# Patient Record
Sex: Female | Born: 1970 | Race: Black or African American | Hispanic: No | Marital: Single | Smoking: Former smoker
Health system: Southern US, Community
[De-identification: ages and names within clinical notes are randomized; demographics above are authoritative.]

## PROBLEM LIST (undated history)

## (undated) DIAGNOSIS — E119 Type 2 diabetes mellitus without complications: Secondary | ICD-10-CM

## (undated) DIAGNOSIS — G4730 Sleep apnea, unspecified: Secondary | ICD-10-CM

## (undated) DIAGNOSIS — N186 End stage renal disease: Secondary | ICD-10-CM

## (undated) DIAGNOSIS — Z9289 Personal history of other medical treatment: Secondary | ICD-10-CM

## (undated) DIAGNOSIS — K219 Gastro-esophageal reflux disease without esophagitis: Secondary | ICD-10-CM

## (undated) DIAGNOSIS — Z992 Dependence on renal dialysis: Secondary | ICD-10-CM

## (undated) DIAGNOSIS — M549 Dorsalgia, unspecified: Secondary | ICD-10-CM

## (undated) DIAGNOSIS — R0600 Dyspnea, unspecified: Secondary | ICD-10-CM

## (undated) DIAGNOSIS — M1990 Unspecified osteoarthritis, unspecified site: Secondary | ICD-10-CM

## (undated) DIAGNOSIS — D649 Anemia, unspecified: Secondary | ICD-10-CM

## (undated) DIAGNOSIS — F419 Anxiety disorder, unspecified: Secondary | ICD-10-CM

## (undated) DIAGNOSIS — G629 Polyneuropathy, unspecified: Secondary | ICD-10-CM

## (undated) DIAGNOSIS — I10 Essential (primary) hypertension: Secondary | ICD-10-CM

## (undated) DIAGNOSIS — G8929 Other chronic pain: Secondary | ICD-10-CM

## (undated) DIAGNOSIS — H409 Unspecified glaucoma: Secondary | ICD-10-CM

## (undated) DIAGNOSIS — I219 Acute myocardial infarction, unspecified: Secondary | ICD-10-CM

## (undated) DIAGNOSIS — E039 Hypothyroidism, unspecified: Secondary | ICD-10-CM

## (undated) DIAGNOSIS — E785 Hyperlipidemia, unspecified: Secondary | ICD-10-CM

## (undated) DIAGNOSIS — Z9981 Dependence on supplemental oxygen: Secondary | ICD-10-CM

## (undated) DIAGNOSIS — J45909 Unspecified asthma, uncomplicated: Secondary | ICD-10-CM

## (undated) HISTORY — PX: KNEE SURGERY: SHX244

## (undated) HISTORY — PX: TUBAL LIGATION: SHX77

## (undated) NOTE — *Deleted (*Deleted)
Rapid Response Event Note   Reason for Call :  Bleeding from L buttock wound.  Pt had hydrotherapy done yesterday(11/13). Last dressing change was at 1000 today and copious serosanguinous drainage was charted.  Initial Focused Assessment:  Pt laying in bed in no distress. She will nod and shake head appropriately to questions, follow commands, and move all extremities. She denies chest pain/SOB/dizziness/lightheadness. Her skin is warm and dry. RN is holding pressure to L buttock wound. There is a continuous flow of blood coming out of wound despite constant pressure.  T-99.6, HR-120s, BP-120/63, RR-22, SpO2-100% on .28 TC.   Bleeding lessened once MD arrive to bedside. MD irrigated wound with NS and dressing was changed. Large clots seen in wound base-these were left undisturbed.   Interventions:  CBC STAT-Hgb 8.1>7.3 Manual pressure held x 40+ minutes.  Wound irrigated by MD and dressing changed.   Transfuse 1 unit PRBCs.  Plan of Care:  Dressing at this time is C/D/I. Transfuse blood and continue to monitor pt closely. Call RRT if further assistance needed.   Event Summary:   MD Notified:  Dr. Coy Saunas notified and came to bedside  Call [place]     Update: 2225-SBP-60-70s, HR-112, pt still at baseline neuro-wise per RN. Pt did received 147mcg fentanyl at 2059. 250cc NS ordered. RN to call bloodbank to see if PRBCs are ready.   Pt seen at 2232. Pt is lethargic but will awaken to verbal command, nod/shake head appropriately, but goes back to sleep very quickly. Skin cool to touch. Pt nods her head "yes" when asked if she's cold. SBP-80s, HR-120s. T and S drawn from pt's CVC and sent to lab. Dr. Coy Saunas to bedside to assess pt. Dressing assessed-it is saturated again and there is a continuous flow of blood again. Pressure held. Additional 250cc NS bolus ordered.     Dillard Essex, RN

## (undated) NOTE — *Deleted (*Deleted)
Physical Therapy Treatment Patient Details Name: Wanda Ramirez MRN: [id number] DOB: 03-07-1971 Today's Date: 05/01/2020    History of Present Illness      PT Comments    ***    Follow Up Recommendations        Equipment Recommendations       Recommendations for Other Services       Precautions / Restrictions      Mobility  Bed Mobility                  Transfers                    Ambulation/Gait                 Stairs             Wheelchair Mobility    Modified Rankin (Stroke Patients Only)       Balance                                            Cognition                                              Exercises      General Comments        Pertinent Vitals/Pain      Home Living                      Prior Function            PT Goals (current goals can now be found in the care plan section)      Frequency           PT Plan      Co-evaluation              AM-PAC PT "6 Clicks" Mobility   Outcome Measure                   End of Session               Time:  -     Charges:                        {enter signature dotphrase here}   Darthula Desa 05/01/2020, 11:58 AM

## (undated) NOTE — *Deleted (*Deleted)
Pharmacy Student Note: For Learning Purposes ONLY. Not an active part of the chart.   Chief Complaint:  Admitted with COVID 19 PNA on 9/13. Progressed to repiratory failure. Condition worsened, sent to ICU then intubated. Extubated 10/6, then quickly re-intubated. Overnight had bleeding on a wound that caused her to be hypotensive, gave multiple rounds of fluids, and PRBCs. Still unstable BP today, and is transitioning to the ICU. Attendings discerning b/w hemorrhagic vs. septic shock.   PMH: ESRD MWF HD, DM, HTN, hypothyroid, HLD, asthma 3L [place], neuropathy, seizure   PSH:   Problems: trach/vent dependece due to COVID ARDS and MSSA PNA, sacral dermal necrosis, MSSA HCAP/VAP, ESRD; DNR-palliative care consulted  Recommendations:   Labs not in yet 11/19 7AM  Admit Complaint:  Anticoagulation: heparin -> d/cd 11/15 922   Infectious Disease: wound infection; concern for HAP/vap (no cxs)  -WBC worsened ( 11/15 to 20.9) but still afebrile -NG Cortrak in place  -meropenem d/cd 11/13 (high risk infection clinical status; undergoing hydrotherapy for wound) -hemorrhage of wound overnight   -on 10/10, resp cx grew S. Aureus (resistant to erythromycin, clindamycin) -on 11/2 E. Coli grew from wound.   -blood cxs came back neg 11/14  Vanc 10/8 >>10/10 10/31>>11/6 Zosyn 10/8 >>10/9; 11/2 >>11/8 Ceftriaxone 10/10 >> 10/16 Remdesivir 9/14 >> 9/18 Decadron 9/14 >>9/23; 10/6 (stridor/incr wob when extubated) >> Actemra x1 9/15 Cefepime 9/20>>9/29 Azithro 9/20>> 9/25 Merrem 11/11 >> 11/13  9/13 BCx - neg 9/14 BCx - neg 9/27 MRSA PCR - neg 9/26 Bcx x2 - neg 10/8 resp MSSA  10/18 BAL -normal flora  10/21 TA - normal flora 10/25 blood>>ngtdF 10/29 resp>>MSSA 11/2 TA >> 11/2 Wound culture >> few Ecoli, few E. Faecalis (Susceptibilities pending), + Anaerobe (merrem) 11/2 BCx negative 11/11 BCx >> ngtd   Cardiovascular -EF 60-65%.  -midodrine 10 mg 11/14 11:50 -> 250 cc boluses ->  unit of blood (1 unit/hr) 11/15 5:15 -> 500 mL IV NS bolus -started levophed 10 mcg to maintain MAPs > pressors D/Cd 11/16 > MAPS ok now. (~80s)\ 11/19 Bps 100-110-ok  Endocrinology: T2DM (attending) -A1c 12.8% -CBGs 119-140s -SSi: determir 30 units BID, aspart TID (11-22 units); 220 at dinner time -CBGs were in range from 11/10-11/14  (140-180) --> then later 11/14, CBGs climbed to 200s.  11/17: in range majority of day 11/18: 200 11/18 AM -PTA synthroid  Gastrointestinal / Nutrition: LFTs wnl, alk phos elevated 316 on 11/12 LBM 11/13 Cortrak in for tube feeds -> MD notes that pt will need PEG tube eventually -Cholestasis of critical illness w/ elevated GGT -SUP w/ Protonix 40mg /44mL  Neurology: ICU delirium -fentanyl weaned off, oxy 10 q6hr -on dex>clonidine wean -clonazepam added 10/21 -seroquel 50 BID and clonazepam -delirium precautions (nonpharm)  Nephrology: ESRD TTS -transitioned from CRRT > HD -Last HD 11/19: 4 hrs, 849 mL (low UF), BFR 400    Pulmonary: trach collar 5L suppl. O2 11/15 > headed to ICU  Electrolytes/other labs -Na 135 11/17 -K 5.8 -> lokelma 10 g TID > 3.9 11/17  Hematology / Oncology -anemia: darbe 150 weekly TSAT 11% Ferritin > 4000 Hgb 7.0 11/15 AM  > Hgb 8.1 11/16 > Hgb 7.8 > Monitor 11/18 -2 units PRBCs given sun night d/t bleed and low Hgb --> not giving Fe d/t high ferritin (> [phone])   MBD -ca and phos in range (binders d/cd > tube feeds)    Ca 9.0 > 9.4     albumn 1.6     Corrected calcium: ~  11 > -phos 11.1 (increased from 11/11-at that time it was in range) > 11 11/18 > on low phos containing tube feeds. Monitor lvls.  -monitor Ca (received rounds of blood) > Ca ok. (9.4)  PTA Medication Issues -off DPH (not taking at home per MD) - Lorin Picket  -sensipar and phoslo  Best Practices: on trach collar F: feeds per Cortrak, to have PEG placed eventually A: fentantyl prn and oxy 10 mg q6 per tube S: quetiapine/clonazepam for  delirium, talked about weaning, but probably not at this point T: possible hemorrhagic shock H:  U: protonix G: glycemic control     Glean Salvo, Festus Aloe PharmD Student

---

## 2014-04-02 HISTORY — PX: AV FISTULA PLACEMENT: SHX1204

## 2015-01-16 DIAGNOSIS — L299 Pruritus, unspecified: Secondary | ICD-10-CM | POA: Insufficient documentation

## 2015-01-16 DIAGNOSIS — R52 Pain, unspecified: Secondary | ICD-10-CM | POA: Insufficient documentation

## 2015-01-16 DIAGNOSIS — D689 Coagulation defect, unspecified: Secondary | ICD-10-CM | POA: Insufficient documentation

## 2015-01-16 DIAGNOSIS — Z841 Family history of disorders of kidney and ureter: Secondary | ICD-10-CM | POA: Insufficient documentation

## 2015-01-16 DIAGNOSIS — M109 Gout, unspecified: Secondary | ICD-10-CM | POA: Insufficient documentation

## 2015-01-16 DIAGNOSIS — G40909 Epilepsy, unspecified, not intractable, without status epilepticus: Secondary | ICD-10-CM | POA: Insufficient documentation

## 2015-01-16 DIAGNOSIS — M1990 Unspecified osteoarthritis, unspecified site: Secondary | ICD-10-CM | POA: Insufficient documentation

## 2015-01-16 DIAGNOSIS — H3500 Unspecified background retinopathy: Secondary | ICD-10-CM | POA: Insufficient documentation

## 2015-01-16 DIAGNOSIS — N2581 Secondary hyperparathyroidism of renal origin: Secondary | ICD-10-CM | POA: Insufficient documentation

## 2015-01-27 DIAGNOSIS — D509 Iron deficiency anemia, unspecified: Secondary | ICD-10-CM | POA: Insufficient documentation

## 2015-02-28 DIAGNOSIS — Z4802 Encounter for removal of sutures: Secondary | ICD-10-CM | POA: Insufficient documentation

## 2015-09-28 DIAGNOSIS — E1142 Type 2 diabetes mellitus with diabetic polyneuropathy: Secondary | ICD-10-CM | POA: Insufficient documentation

## 2015-09-28 DIAGNOSIS — Z794 Long term (current) use of insulin: Secondary | ICD-10-CM | POA: Insufficient documentation

## 2015-10-23 DIAGNOSIS — E8770 Fluid overload, unspecified: Secondary | ICD-10-CM | POA: Insufficient documentation

## 2016-03-16 ENCOUNTER — Observation Stay (HOSPITAL_COMMUNITY)
Admission: AD | Admit: 2016-03-16 | Discharge: 2016-03-18 | Disposition: A | Discharge status: Home / Self Care | Payer: Medicaid Other | Source: Other Acute Inpatient Hospital | Attending: Internal Medicine | Admitting: Internal Medicine

## 2016-03-16 ENCOUNTER — Encounter (HOSPITAL_COMMUNITY): Payer: Self-pay | Admitting: Internal Medicine

## 2016-03-16 ENCOUNTER — Observation Stay: Admit: 2016-03-16 | Payer: Self-pay | Admitting: Family Medicine

## 2016-03-16 DIAGNOSIS — R0789 Other chest pain: Principal | ICD-10-CM | POA: Insufficient documentation

## 2016-03-16 DIAGNOSIS — D72829 Elevated white blood cell count, unspecified: Secondary | ICD-10-CM

## 2016-03-16 DIAGNOSIS — E1169 Type 2 diabetes mellitus with other specified complication: Secondary | ICD-10-CM | POA: Diagnosis present

## 2016-03-16 DIAGNOSIS — Z79899 Other long term (current) drug therapy: Secondary | ICD-10-CM | POA: Diagnosis not present

## 2016-03-16 DIAGNOSIS — E669 Obesity, unspecified: Secondary | ICD-10-CM

## 2016-03-16 DIAGNOSIS — H409 Unspecified glaucoma: Secondary | ICD-10-CM | POA: Insufficient documentation

## 2016-03-16 DIAGNOSIS — N186 End stage renal disease: Secondary | ICD-10-CM

## 2016-03-16 DIAGNOSIS — I10 Essential (primary) hypertension: Secondary | ICD-10-CM | POA: Diagnosis present

## 2016-03-16 DIAGNOSIS — E039 Hypothyroidism, unspecified: Secondary | ICD-10-CM | POA: Diagnosis present

## 2016-03-16 DIAGNOSIS — K219 Gastro-esophageal reflux disease without esophagitis: Secondary | ICD-10-CM | POA: Insufficient documentation

## 2016-03-16 DIAGNOSIS — E1122 Type 2 diabetes mellitus with diabetic chronic kidney disease: Secondary | ICD-10-CM | POA: Diagnosis not present

## 2016-03-16 DIAGNOSIS — E119 Type 2 diabetes mellitus without complications: Secondary | ICD-10-CM | POA: Diagnosis present

## 2016-03-16 DIAGNOSIS — I120 Hypertensive chronic kidney disease with stage 5 chronic kidney disease or end stage renal disease: Secondary | ICD-10-CM | POA: Insufficient documentation

## 2016-03-16 DIAGNOSIS — E1142 Type 2 diabetes mellitus with diabetic polyneuropathy: Secondary | ICD-10-CM | POA: Diagnosis not present

## 2016-03-16 DIAGNOSIS — Z6841 Body Mass Index (BMI) 40.0 and over, adult: Secondary | ICD-10-CM | POA: Diagnosis not present

## 2016-03-16 DIAGNOSIS — Z87891 Personal history of nicotine dependence: Secondary | ICD-10-CM | POA: Insufficient documentation

## 2016-03-16 DIAGNOSIS — Z794 Long term (current) use of insulin: Secondary | ICD-10-CM | POA: Diagnosis not present

## 2016-03-16 DIAGNOSIS — Z7982 Long term (current) use of aspirin: Secondary | ICD-10-CM | POA: Diagnosis not present

## 2016-03-16 DIAGNOSIS — G4733 Obstructive sleep apnea (adult) (pediatric): Secondary | ICD-10-CM | POA: Diagnosis not present

## 2016-03-16 DIAGNOSIS — G629 Polyneuropathy, unspecified: Secondary | ICD-10-CM

## 2016-03-16 DIAGNOSIS — R079 Chest pain, unspecified: Secondary | ICD-10-CM | POA: Diagnosis not present

## 2016-03-16 DIAGNOSIS — E785 Hyperlipidemia, unspecified: Secondary | ICD-10-CM

## 2016-03-16 DIAGNOSIS — Z992 Dependence on renal dialysis: Secondary | ICD-10-CM | POA: Diagnosis not present

## 2016-03-16 DIAGNOSIS — I959 Hypotension, unspecified: Secondary | ICD-10-CM | POA: Diagnosis not present

## 2016-03-16 DIAGNOSIS — R531 Weakness: Secondary | ICD-10-CM

## 2016-03-16 DIAGNOSIS — R072 Precordial pain: Secondary | ICD-10-CM | POA: Diagnosis present

## 2016-03-16 HISTORY — DX: Sleep apnea, unspecified: G47.30

## 2016-03-16 HISTORY — DX: Other chronic pain: G89.29

## 2016-03-16 HISTORY — DX: Unspecified glaucoma: H40.9

## 2016-03-16 HISTORY — DX: Dependence on renal dialysis: Z99.2

## 2016-03-16 HISTORY — DX: Type 2 diabetes mellitus without complications: E11.9

## 2016-03-16 HISTORY — DX: Gastro-esophageal reflux disease without esophagitis: K21.9

## 2016-03-16 HISTORY — DX: Morbid (severe) obesity due to excess calories: E66.01

## 2016-03-16 HISTORY — DX: Hypothyroidism, unspecified: E03.9

## 2016-03-16 HISTORY — DX: Unspecified osteoarthritis, unspecified site: M19.90

## 2016-03-16 HISTORY — DX: Polyneuropathy, unspecified: G62.9

## 2016-03-16 HISTORY — DX: Hyperlipidemia, unspecified: E78.5

## 2016-03-16 HISTORY — DX: Essential (primary) hypertension: I10

## 2016-03-16 HISTORY — DX: End stage renal disease: N18.6

## 2016-03-16 HISTORY — DX: Unspecified asthma, uncomplicated: J45.909

## 2016-03-16 HISTORY — DX: Dorsalgia, unspecified: M54.9

## 2016-03-16 LAB — GLUCOSE, CAPILLARY: GLUCOSE-CAPILLARY: 88 mg/dL (ref 65–99)

## 2016-03-16 LAB — TROPONIN I

## 2016-03-16 MED ORDER — ASPIRIN EC 325 MG PO TBEC
325.0000 mg | DELAYED_RELEASE_TABLET | Freq: Every day | ORAL | Status: DC
  Administered 2016-03-18: 325 mg via ORAL
  Filled 2016-03-16 (×2): qty 1

## 2016-03-16 MED ORDER — GI COCKTAIL ~~LOC~~: 30.0000 mL | Freq: Four times a day (QID) | ORAL | Status: DC | PRN

## 2016-03-16 MED ORDER — DOCUSATE SODIUM 283 MG RE ENEM
1.0000 | ENEMA | RECTAL | Status: DC | PRN
  Filled 2016-03-16: qty 1

## 2016-03-16 MED ORDER — GABAPENTIN 600 MG PO TABS: 300.0000 mg | ORAL_TABLET | Freq: Once | ORAL | Status: DC

## 2016-03-16 MED ORDER — ENOXAPARIN SODIUM 40 MG/0.4ML ~~LOC~~ SOLN
40.0000 mg | SUBCUTANEOUS | Status: DC
  Administered 2016-03-16: 40 mg via SUBCUTANEOUS
  Filled 2016-03-16: qty 0.4

## 2016-03-16 MED ORDER — ACETAMINOPHEN 325 MG PO TABS: 650.0000 mg | ORAL_TABLET | ORAL | Status: DC | PRN

## 2016-03-16 MED ORDER — ONDANSETRON HCL 4 MG/2ML IJ SOLN: 4.0000 mg | Freq: Four times a day (QID) | INTRAMUSCULAR | Status: DC | PRN

## 2016-03-16 MED ORDER — NEPRO/CARBSTEADY PO LIQD
237.0000 mL | Freq: Three times a day (TID) | ORAL | Status: DC | PRN
Start: 2016-03-16 — End: 2016-03-19
  Filled 2016-03-16: qty 237

## 2016-03-16 MED ORDER — INSULIN ASPART 100 UNIT/ML ~~LOC~~ SOLN
0.0000 [IU] | Freq: Three times a day (TID) | SUBCUTANEOUS | Status: DC
  Administered 2016-03-17 (×2): 2 [IU] via SUBCUTANEOUS
  Administered 2016-03-18 (×2): 3 [IU] via SUBCUTANEOUS

## 2016-03-16 MED ORDER — ZOLPIDEM TARTRATE 5 MG PO TABS: 5.0000 mg | ORAL_TABLET | Freq: Every evening | ORAL | Status: DC | PRN

## 2016-03-16 MED ORDER — MORPHINE SULFATE (PF) 2 MG/ML IV SOLN
2.0000 mg | INTRAVENOUS | Status: DC | PRN
  Administered 2016-03-17: 2 mg via INTRAVENOUS
  Filled 2016-03-16: qty 1

## 2016-03-16 MED ORDER — CAMPHOR-MENTHOL 0.5-0.5 % EX LOTN: 1.0000 "application " | TOPICAL_LOTION | Freq: Three times a day (TID) | CUTANEOUS | Status: DC | PRN

## 2016-03-16 MED ORDER — CALCIUM CARBONATE ANTACID 1250 MG/5ML PO SUSP
500.0000 mg | Freq: Four times a day (QID) | ORAL | Status: DC | PRN
Start: 2016-03-16 — End: 2016-03-19

## 2016-03-16 MED ORDER — HYDROXYZINE HCL 25 MG PO TABS: 25.0000 mg | ORAL_TABLET | Freq: Three times a day (TID) | ORAL | Status: DC | PRN

## 2016-03-16 MED ORDER — SORBITOL 70 % SOLN: 30.0000 mL | Status: DC | PRN

## 2016-03-16 NOTE — H&P (Signed)
History and Physical    Wanda Ramirez TKW:[id number] DOB: 1971-03-05 DOA: 03/16/2016  PCP: Welford Roche Consultants:  Peri Jefferson - endocrinology; Justin Mend - nephrology; Whitaker - ophthalmology; vascular Patient coming from: home - lives alone; [place]: daughter, [phone]  Chief Complaint: chest pain  HPI: Wanda Ramirez is a 45 y.o. female with medical history significant of ESRD on HD; DM; HTN; HLD presenting with chest pain as a transfer from [facility]  Patient was in church this AM and felt very weak, nauseated.  Chest tightness.  Went to [facility] and they did some lab work - other than creatinine all looked okay.  Gets HD on Tu/Th/Sat - but also went Wednesday the last 2 weeks because they have been trying to draw off extra fluid.  Substernal chest pressure associated with SOB.  Exerting herself in church - singing and clapping and praising God.  No NTG because of borderline low BP, per patient report.  Chest pressure resolved spontaneously and has not recurred.  No diaphoresis.  Unsure if she has had a stress test recently.  Has never had a catheterization.  She is now back to normal, feels a little tired.   ED Course:  WBC 15.3; CXR negative for fluid overload  Review of Systems: As per HPI; otherwise 10 point review of systems reviewed and negative.   Ambulatory Status:  Ambulates indepedently unless she has leg pain, then uses a cane  Past Medical History:  Diagnosis Date  . Diabetes mellitus ([place])   . ESRD (end stage renal disease) on dialysis ([place])   . Essential hypertension   . GERD (gastroesophageal reflux disease)   . Glaucoma   . Hyperlipidemia   . Hypothyroidism   . Sleep apnea     Past Surgical History:  Procedure Laterality Date  . AV FISTULA PLACEMENT Left 04/2014  . KNEE SURGERY    . TUBAL LIGATION      Social History   Social History  . Marital status: Single    Spouse name: N/A  . Number of children: N/A  . Years of education: N/A    Occupational History  . disabled    Social History Main Topics  . Smoking status: Former Research scientist (life sciences)  . Smokeless tobacco: Never Used  . Alcohol use No     Comment: years ago  . Drug use: No  . Sexual activity: Not on file   Other Topics Concern  . Not on file   Social History Narrative  . No narrative on file    Not on File  Family History  Problem Relation Age of Onset  . Diabetes Mother   . Hyperlipidemia Mother   . Kidney disease Father     Prior to Admission medications   Not on File    Physical Exam: Vitals:   03/16/16 1803 03/16/16 2045  BP: 104/63 120/78  Pulse: 73 77  Resp: 18 18  Temp: 98.7 F (37.1 C) 98.2 F (36.8 C)  TempSrc: Oral Oral  SpO2: 98% 98%  Weight: 121.6 kg (268 lb 1.3 oz)   Height: 5\' 2"  (1.575 m)      General: Appears calm and comfortable and is NAD, morbidly obese Eyes: EOMI, normal lids, iris ENT:  grossly normal hearing, lips & tongue, mmm Neck:  no LAD, masses or thyromegaly Cardiovascular:  RRR, no m/r/g. No LE edema.  Respiratory: CTA bilaterally, no w/r/r. Normal respiratory effort. Abdomen:  soft, ntnd, NABS Skin:  no rash or induration seen on limited  exam Musculoskeletal:  grossly normal tone BUE/BLE, good ROM, no bony abnormality Psychiatric:  grossly normal mood and affect, speech fluent and appropriate, AOx3 Neurologic:  CN 2-12 grossly intact, moves all extremities in coordinated fashion, sensation intact  Labs on Admission: I have personally reviewed following labs and imaging studies  CBC: No results for input(s): WBC, NEUTROABS, HGB, HCT, MCV, PLT in the last 168 hours. Basic Metabolic Panel: No results for input(s): NA, K, CL, CO2, GLUCOSE, BUN, CREATININE, CALCIUM, MG, PHOS in the last 168 hours. GFR: CrCl cannot be calculated (No order found.). Liver Function Tests: No results for input(s): AST, ALT, ALKPHOS, BILITOT, PROT, ALBUMIN in the last 168 hours. No results for input(s): LIPASE, AMYLASE in the  last 168 hours. No results for input(s): AMMONIA in the last 168 hours. Coagulation Profile: No results for input(s): INR, PROTIME in the last 168 hours. Cardiac Enzymes:  Recent Labs Lab 03/16/16 2040  TROPONINI <0.03   BNP (last 3 results) No results for input(s): PROBNP in the last 8760 hours. HbA1C: No results for input(s): HGBA1C in the last 72 hours. CBG:  Recent Labs Lab 03/16/16 2032  GLUCAP 88   Lipid Profile: No results for input(s): CHOL, HDL, LDLCALC, TRIG, CHOLHDL, LDLDIRECT in the last 72 hours. Thyroid Function Tests: No results for input(s): TSH, T4TOTAL, FREET4, T3FREE, THYROIDAB in the last 72 hours. Anemia Panel: No results for input(s): VITAMINB12, FOLATE, FERRITIN, TIBC, IRON, RETICCTPCT in the last 72 hours. Urine analysis: No results found for: COLORURINE, APPEARANCEUR, LABSPEC, PHURINE, GLUCOSEU, HGBUR, BILIRUBINUR, KETONESUR, PROTEINUR, UROBILINOGEN, NITRITE, LEUKOCYTESUR  Creatinine Clearance: CrCl cannot be calculated (No order found.).  Sepsis Labs: @LABRCNTIP (procalcitonin:4,lacticidven:4) )No results found for this or any previous visit (from the past 240 hour(s)).   Radiological Exams on Admission: No results found.  EKG: Reportedly negative, none on file, will order  Assessment/Plan Principal Problem:   Chest pain Active Problems:   ESRD on dialysis ([place])   Diabetes mellitus type 2 in obese [facility])   Essential hypertension   Hypothyroidism   OSA (obstructive sleep apnea)   Chest pain -Patient with substernal chest pain that came on with exertion and resolved with rest. -2-3/3 typical symptoms suggestive of angina. -Chest pain has not recurred. -CXR reportedly unremarkable.   -Initial cardiac enzymes negative, 2nd troponin here negative.   -EKG reportedly not indicative of acute ischemia, repeat pending.   -TIMI risk score is 3; which predicts a 14 days risk of death, recurrent MI, or urgent revascularization of 13.2%.  -Will  plan to place in observation status on telemetry to rule out ACS by overnight observation.  -cycle cardiac enzymes q6h x 3 and repeat EKG in AM -ASA daily -morphine given -Risk factor stratification with FLP and HgbA1c; will also check TSH and UDS -Cardiology consultation in AM - unassigned, notified via Inbox message to the CardsMaster  ESRD -For Tu/Th/Sat HD - but has been dialyzed the last 2 Wednesdays as well, due to volume overload -Nephrology prn order set utilized -Will notify maintenance HD line of admission  DM -Patient does not know her home medications and they are not available to the pharmacy at this time either (I called to request that they obtain a list) -Will cover with moderate-dose SSI (balancing renal disease with obesity)  HTN -Patient is also not aware of what HTN medications she takes -Will monitor and add agents if needed overnight -Beta-blocker would be first-line  Hypothyroidism -Will check TSH -Hopefully we can obtain list of medications and initiate  home dose of Synthroid in the AM  OSA -CPAP ordered  DVT prophylaxis: Lovenox  Code Status:  Full - confirmed with patient/family Family Communication: Children/grandchildren present throughout Disposition Plan:  Home once clinically improved Consults called: Cardiology, nephrology - both to see in AM  Admission status: It is my clinical opinion that referral for OBSERVATION is reasonable and necessary in this patient based on the above information provided. The aforementioned taken together are felt to place the patient at high risk for further clinical deterioration. However it is anticipated that the patient may be medically stable for discharge from the hospital within 24 to 48 hours.    Karmen Bongo MD [facility]  If 7PM-7AM, please contact night-coverage [email or web address] Password [email or web address]  03/16/2016, 10:27 PM

## 2016-03-16 NOTE — Progress Notes (Signed)
Received via stretcher from [place] [facility]. Pt resting in bed. See assessment for further assessment.

## 2016-03-17 ENCOUNTER — Encounter (HOSPITAL_COMMUNITY)
Admission: AD | Disposition: A | Discharge status: Home / Self Care | Payer: Self-pay | Source: Other Acute Inpatient Hospital | Attending: Internal Medicine

## 2016-03-17 ENCOUNTER — Encounter (HOSPITAL_COMMUNITY): Payer: Self-pay | Admitting: Physician Assistant

## 2016-03-17 DIAGNOSIS — D72829 Elevated white blood cell count, unspecified: Secondary | ICD-10-CM

## 2016-03-17 DIAGNOSIS — E039 Hypothyroidism, unspecified: Secondary | ICD-10-CM | POA: Diagnosis not present

## 2016-03-17 DIAGNOSIS — I959 Hypotension, unspecified: Secondary | ICD-10-CM

## 2016-03-17 DIAGNOSIS — R072 Precordial pain: Secondary | ICD-10-CM | POA: Diagnosis not present

## 2016-03-17 DIAGNOSIS — G4733 Obstructive sleep apnea (adult) (pediatric): Secondary | ICD-10-CM | POA: Diagnosis not present

## 2016-03-17 DIAGNOSIS — Z992 Dependence on renal dialysis: Secondary | ICD-10-CM | POA: Diagnosis not present

## 2016-03-17 DIAGNOSIS — R0789 Other chest pain: Secondary | ICD-10-CM

## 2016-03-17 DIAGNOSIS — E1169 Type 2 diabetes mellitus with other specified complication: Secondary | ICD-10-CM | POA: Diagnosis not present

## 2016-03-17 DIAGNOSIS — I200 Unstable angina: Secondary | ICD-10-CM | POA: Diagnosis not present

## 2016-03-17 DIAGNOSIS — N186 End stage renal disease: Secondary | ICD-10-CM | POA: Diagnosis not present

## 2016-03-17 DIAGNOSIS — R531 Weakness: Secondary | ICD-10-CM

## 2016-03-17 DIAGNOSIS — G629 Polyneuropathy, unspecified: Secondary | ICD-10-CM

## 2016-03-17 DIAGNOSIS — E785 Hyperlipidemia, unspecified: Secondary | ICD-10-CM

## 2016-03-17 DIAGNOSIS — I10 Essential (primary) hypertension: Secondary | ICD-10-CM | POA: Diagnosis not present

## 2016-03-17 HISTORY — PX: CARDIAC CATHETERIZATION: SHX172

## 2016-03-17 LAB — GLUCOSE, CAPILLARY
GLUCOSE-CAPILLARY: 145 mg/dL — AB (ref 65–99)
GLUCOSE-CAPILLARY: 102 mg/dL — AB (ref 65–99)
Glucose-Capillary: 136 mg/dL — ABNORMAL HIGH (ref 65–99)
Glucose-Capillary: 103 mg/dL — ABNORMAL HIGH (ref 65–99)
Glucose-Capillary: 219 mg/dL — ABNORMAL HIGH (ref 65–99)

## 2016-03-17 LAB — RAPID URINE DRUG SCREEN, HOSP PERFORMED
AMPHETAMINES: NOT DETECTED
Barbiturates: NOT DETECTED
Benzodiazepines: NOT DETECTED
Cocaine: NOT DETECTED
OPIATES: NOT DETECTED
Tetrahydrocannabinol: NOT DETECTED

## 2016-03-17 LAB — LIPID PANEL
CHOL/HDL RATIO: 3.8 ratio
Cholesterol: 118 mg/dL (ref 0–200)
HDL: 31 mg/dL — AB (ref >40)
LDL CALC: 50 mg/dL (ref 0–99)
Triglycerides: 186 mg/dL — ABNORMAL HIGH (ref <150)
VLDL: 37 mg/dL (ref 0–40)

## 2016-03-17 LAB — TSH: TSH: 1.549 u[IU]/mL (ref 0.350–4.500)

## 2016-03-17 LAB — TROPONIN I

## 2016-03-17 SURGERY — LEFT HEART CATH AND CORONARY ANGIOGRAPHY

## 2016-03-17 MED ORDER — LIDOCAINE HCL (PF) 1 % IJ SOLN
INTRAMUSCULAR | Status: DC | PRN
  Administered 2016-03-17: 15 mL via INTRADERMAL

## 2016-03-17 MED ORDER — IOPAMIDOL (ISOVUE-370) INJECTION 76%
INTRAVENOUS | Status: AC
  Filled 2016-03-17: qty 50

## 2016-03-17 MED ORDER — MIDAZOLAM HCL 2 MG/2ML IJ SOLN
INTRAMUSCULAR | Status: DC | PRN
  Administered 2016-03-17: 1 mg via INTRAVENOUS

## 2016-03-17 MED ORDER — MIDAZOLAM HCL 2 MG/2ML IJ SOLN
INTRAMUSCULAR | Status: AC
  Filled 2016-03-17: qty 2

## 2016-03-17 MED ORDER — ASPIRIN 81 MG PO CHEW
81.0000 mg | CHEWABLE_TABLET | ORAL | Status: AC
  Administered 2016-03-17: 81 mg via ORAL
  Filled 2016-03-17: qty 1

## 2016-03-17 MED ORDER — IOPAMIDOL (ISOVUE-370) INJECTION 76%
INTRAVENOUS | Status: AC
  Filled 2016-03-17: qty 100

## 2016-03-17 MED ORDER — SODIUM CHLORIDE 0.9 % IV SOLN
INTRAVENOUS | Status: DC
  Administered 2016-03-17: 12:00:00 via INTRAVENOUS

## 2016-03-17 MED ORDER — LIDOCAINE HCL (PF) 1 % IJ SOLN
INTRAMUSCULAR | Status: AC
  Filled 2016-03-17: qty 30

## 2016-03-17 MED ORDER — HEPARIN (PORCINE) IN NACL 2-0.9 UNIT/ML-% IJ SOLN
INTRAMUSCULAR | Status: DC | PRN
  Administered 2016-03-17: 1000 mL

## 2016-03-17 MED ORDER — IOPAMIDOL (ISOVUE-370) INJECTION 76%
INTRAVENOUS | Status: DC | PRN
Start: 2016-03-17 — End: 2016-03-17
  Administered 2016-03-17: 110 mL via INTRA_ARTERIAL

## 2016-03-17 MED ORDER — HEPARIN (PORCINE) IN NACL 2-0.9 UNIT/ML-% IJ SOLN
INTRAMUSCULAR | Status: AC
  Filled 2016-03-17: qty 1000

## 2016-03-17 MED ORDER — SODIUM CHLORIDE 0.9% FLUSH: 3.0000 mL | INTRAVENOUS | Status: DC | PRN

## 2016-03-17 SURGICAL SUPPLY — 9 items
CATH INFINITI 5 FR 3DRC (CATHETERS) ×3 IMPLANT
CATH INFINITI 5FR MULTPACK ANG (CATHETERS) ×3 IMPLANT
KIT HEART LEFT (KITS) ×3 IMPLANT
PACK CARDIAC CATHETERIZATION (CUSTOM PROCEDURE TRAY) ×3 IMPLANT
SHEATH PINNACLE 5F 10CM (SHEATH) ×6 IMPLANT
SYR MEDRAD MARK V 150ML (SYRINGE) ×3 IMPLANT
TRANSDUCER W/STOPCOCK (MISCELLANEOUS) ×3 IMPLANT
TUBING CIL FLEX 10 FLL-RA (TUBING) ×3 IMPLANT
WIRE EMERALD 3MM-J .035X150CM (WIRE) ×3 IMPLANT

## 2016-03-17 NOTE — Interval H&P Note (Signed)
History and Physical Interval Note:  03/17/2016 2:23 PM  Wanda Ramirez  has presented today for cardiac cath with the diagnosis of unstable angina.  The various methods of treatment have been discussed with the patient and family. After consideration of risks, benefits and other options for treatment, the patient has consented to  Procedure(s): Left Heart Cath and Coronary Angiography (N/A) as a surgical intervention .  The patient's history has been reviewed, patient examined, no change in status, stable for surgery.  I have reviewed the patient's chart and labs.  Questions were answered to the patient's satisfaction.    Cath Lab Visit (complete for each Cath Lab visit)  Clinical Evaluation Leading to the Procedure:   ACS: No.  Non-ACS:    Anginal Classification: CCS II  Anti-ischemic medical therapy: Maximal Therapy (2 or more classes of medications)  Non-Invasive Test Results: No non-invasive testing performed  Prior CABG: No previous CABG         Wanda Ramirez

## 2016-03-17 NOTE — H&P (View-Only) (Signed)
Cardiology Consultation Note    Patient ID: Wanda Ramirez, MRN: [id number], DOB/AGE: [birth year] 45 y.o. Admit date: 03/16/2016   Date of Consult: 03/17/2016 Primary Physician: Welford Roche, NP Primary Cardiologist: New to Dr. Angelena Form (lives in [place]), [place]  Chief Complaint: weak, chest pressure Reason for Consultation: chest pressure with cardiac risk factors Requesting MD: Dr. Maylene Roes  HPI: Wanda Ramirez is a 45 y.o. morbidly obese AAF with history of ESRD on HD TTS, HTN, HLD, diabetes mellitus c/b peripheral neuropathy, asthma, chronic back pain, arthritis, OSA (reports compliance with CPAP), hypothyroidism, and glaucoma who presented upon transfer from [facility] with weakness, SOB and chest pressure. She felt generally weak and nauseated on Saturday 03/15/16. On Sunday (yesterday) she was at church clapping and singing when she began feel to feel very weak and nauseated again. She was taken to [facility] - upon arrival she began having chest tightness and SOB that lasted about 20 minutes, not provoked by anything in particular, resolved spontaneously without intervention. This felt similar to prior asthma symptoms. She can recall 2 other instances of chest pressure recently - once about a month ago requiring dialysis to be stopped early, and the second about 3 weeks ago while sitting at rest - developed spontaneous chest pressure/dyspnea that lasted about 15 minutes, resolved with lying down. She has not had any further episodes since admission. She is very sedentary due to back/arthritis issues. She does admit to DOE for the past several months but no recent exertional chest pain. Per records received 359m ASA, saline bolus,  [facility] appeared normal at that time but has since been on the softer side. Tele unremarkable. [facility], WBC 15.3, Hgb 11.8, Hct 35.2, Plt 270, Na 135, K 4.2, Cl 94, CO2 24, BUN 61, Cr 9.4,  glucose 123, T Bili 0.5, AST 28, ALT 31, Alk phos 140, troponin neg x1, pBNP 126 (wnl), albumin 4.5, PT 10.2, INR 1.0, APTT 25.7. CXR 03/16/16: no active disease. Cariopericardial silhouette is at upper limits of normal for size. Formal home med rec still pending. Trop neg x2 here, LDL 50, TSH wnl, UDS wnl, EKG nonacute. Of note she has been dialyzed the last 2 Wed in addition to TTS schedule due to volume overload.  Pending formal reconciliation by pharmacy, but ROval LinseyList includes levothyroxine, aspirin, albuterol, amlodipine, Simbrinza eye drops, Cinacalcet, Vit D, Lasix, Gabapentin, Insulin, Toprol 1078m Prilosec, Zocor 10, Travatan, Advair, Oxycodone, Calcium, Spiriva   Past Medical History:  Diagnosis Date  . Arthritis   . Asthma   . Chronic back pain   . Diabetes mellitus ([facility]  . [place] ([place] disease) [place]  . Essential hypertension   . GERD (gastroesophageal reflux disease)   . Glaucoma   . Hyperlipidemia   . Hypothyroidism   . Morbid obesity ([facility]  . Peripheral neuropathy ([facility] [place]  . Sleep apnea       Surgical History:  Past Surgical History:  Procedure Laterality Date  . AV FISTULA PLACEMENT Left 04/2014  . KNEE SURGERY    . TUBAL LIGATION       Home Meds: PENDING verification as patient is unsure.  Prior to Admission medications   Medication Sig Start Date End Date Taking? Authorizing Provider  acetaminophen (TYLENOL) 325 MG tablet Take 650 mg by mouth every 6 (six) hours as needed for mild pain.   Yes Historical Provider, MD  insulin lispro (HUMALOG) 100 UNIT/ML injection Inject 0-10 Units into the skin 3 (three) times daily before meals.   Yes Historical Provider, MD  Oxycodone HCl 10 MG TABS Take 10 mg by mouth 4 (four) times daily as needed for pain. 01/01/16  Yes Historical Provider, MD    Inpatient Medications:  . aspirin EC  325 mg Oral Daily  . enoxaparin (LOVENOX) injection  40 mg Subcutaneous Q24H  . insulin aspart  0-15 Units  Subcutaneous TID WC      Allergies: Not on File  Social History   Social History  . Marital status: Single    Spouse name: N/A  . Number of children: N/A  . Years of education: N/A   Occupational History  . disabled    Social History Main Topics  . Smoking status: Former Research scientist (life sciences)  . Smokeless tobacco: Never Used     Comment: Smoked x 2 yrs  . Alcohol use No     Comment: years ago - has since quit  . Drug use: No  . Sexual activity: Not on file   Other Topics Concern  . Not on file   Social History Narrative  . No narrative on file     Family History  Problem Relation Age of Onset  . Diabetes Mother   . Hyperlipidemia Mother   . Kidney disease Father      Review of Systems: All other systems reviewed and are otherwise negative except as noted above.  Labs:  Recent Labs  03/16/16 2040 03/17/16 0514  TROPONINI <0.03 <0.03     Lab Results  Component Value Date   CHOL 118 03/16/2016   HDL 31 (L) 03/16/2016   LDLCALC 50 03/16/2016   TRIG 186 (H) 03/16/2016   As above  Radiology/Studies:  CXR as above  Wt Readings from Last 3 Encounters:  03/16/16 268 lb 1.3 oz (121.6 kg)    EKG: NSR 73bpm, no acute ST-T changes  Physical Exam: Blood pressure (!) 99/57, pulse 71, temperature 98.1 F (36.7 C), temperature source Oral, resp. rate 17, height _0  (1.575 m), weight 268 lb 1.3 oz (121.6 kg), SpO2 97 %. Body mass index is 49.03 kg/m. General: Well developed morbidly obese AAF in no acute distress. Appears older than stated age. Skin with acanthosis nigricans Head: Normocephalic, atraumatic, sclera non-icteric, no xanthomas, nares are without discharge.  Neck: Negative for carotid bruits. JVD not elevated. Lungs: Clear bilaterally to auscultation without wheezes, rales, or rhonchi. Breathing is unlabored. Heart: RRR with S1 S2. No murmurs, rubs, or gallops appreciated. Abdomen: Soft, non-tender, non-distended with normoactive bowel sounds. No  hepatomegaly. No rebound/guarding. No obvious abdominal masses. Msk:  Strength and tone appear normal for age. Extremities: No clubbing or cyanosis. No edema.  Distal pedal pulses are 2+ and equal bilaterally. Neuro: Alert and oriented X 3. No facial asymmetry. No focal deficit. Moves all extremities spontaneously. Psych:  Responds to questions appropriately with a normal affect.     Assessment and Plan  46 y.o. morbidly obese AAF with history of ESRD on HD TTS, HTN, HLD, diabetes mellitus c/b peripheral neuropathy, asthma, chronic back pain, arthritis, OSA (reports compliance with CPAP), hypothyroidism, and glaucoma presented with multiple episodes of weakness and chest pressure, r/o for MI, found to have softer blood pressure and leukocytosis with WBC 15k.  1. Weakness/chest pressure - question whether weakness was related to hypotension. Home med rec not yet finalized but antihypertensives have not been re-ordered at this time. Has had 3  episodes of chest pressure over the last month with multiple cardiac risk factors. Continues on ASA at this time. Will review plan for further workup with MD. Noninvasive testing may prove challenging given her body habitus.  2. ESRD on HD - per nephrology. Per IM notes, has required HD last 2 Wednesdays in addition to TTS due to volume overload. Will check echo to eval for cardiomyopathy.  3. HTN - as above.  4. Leukocytosis - ? Etiology. Further per IM. Will order f/u labs for AM.  5. Hyperlipidemia - recommend continue statin once dose verified.  Signed, Charlie Pitter PA-C 03/17/2016, 11:05 AM Pager: [phone]  I have personally seen and examined this patient with Melina Copa, PA-C. I agree with the assessment and plan as outlined above. She is admitted with chest pain. Troponin negative. EKG without ischemic changes. Many risk factors for CAD including DM, HTN, HLD, obesity and ESRD on HD. Exam shows a morbidly obese female in NAD. CV:RRR with no  murmurs. Lungs: clear bilaterally. Ext: no LE edema. Labs reviewed. EKG reviewed by me and shows NSR with no ischemic changes.  Cannot exclude unstable angina. She will need an ischemic workup. We have discussed cath vs stress testing. Given her morbid obesity, I do not think a nuclear stress test is the best option. Will plan cardiac cath later today. Risks and benefits of procedure are reviewed with the patient. She agrees to proceed. Groin approach since she is ESRD on HD.   Lauree Chandler 03/17/2016 11:29 AM

## 2016-03-17 NOTE — Progress Notes (Addendum)
Site area: RFA Site Prior to Removal:  Level 0 Pressure Applied For:25 min Manual:  yes  Patient Status During Pull:  stable Post Pull Site:  Level 0 Post Pull Instructions Given:  yes Post Pull Pulses Present: palpable Dressing Applied:  tegaderm Bedrest begins @ 1550 till 1950 Comments:

## 2016-03-17 NOTE — Consult Note (Signed)
  Cardiology Consultation Note    Patient ID: Wanda Ramirez, MRN: [id number], DOB/AGE: 07/07/1970 45 y.o. Admit date: 03/16/2016   Date of Consult: 03/17/2016 Primary Physician: MCRAE, LAUREN, NP Primary Cardiologist: New to Dr. McAlhany (lives in [place]), [place]  Chief Complaint: weak, chest pressure Reason for Consultation: chest pressure with cardiac risk factors Requesting MD: Dr. Choi  HPI: Wanda Ramirez is a 45 y.o. morbidly obese AAF with history of ESRD on HD TTS, HTN, HLD, diabetes mellitus c/b peripheral neuropathy, asthma, chronic back pain, arthritis, OSA (reports compliance with CPAP), hypothyroidism, and glaucoma who presented upon transfer from [facility] with weakness, SOB and chest pressure. She felt generally weak and nauseated on Saturday 03/15/16. On Sunday (yesterday) she was at church clapping and singing when she began feel to feel very weak and nauseated again. She was taken to [facility] - upon arrival she began having chest tightness and SOB that lasted about 20 minutes, not provoked by anything in particular, resolved spontaneously without intervention. This felt similar to prior asthma symptoms. She can recall 2 other instances of chest pressure recently - once about a month ago requiring dialysis to be stopped early, and the second about 3 weeks ago while sitting at rest - developed spontaneous chest pressure/dyspnea that lasted about 15 minutes, resolved with lying down. She has not had any further episodes since admission. She is very sedentary due to back/arthritis issues. She does admit to DOE for the past several months but no recent exertional chest pain. Per records received 324mg ASA, saline bolus,  Zofran prior to transfer to [facility] - BP appeared normal at that time but has since been on the softer side. Tele unremarkable. Initial temp at [place] 99.5, WBC 15.3, Hgb 11.8, Hct 35.2, Plt 270, Na 135, K 4.2, Cl 94, CO2 24, BUN 61, Cr 9.4,  glucose 123, T Bili 0.5, AST 28, ALT 31, Alk phos 140, troponin neg x1, pBNP 126 (wnl), albumin 4.5, PT 10.2, INR 1.0, APTT 25.7. CXR 03/16/16: no active disease. Cariopericardial silhouette is at upper limits of normal for size. Formal home med rec still pending. Trop neg x2 here, LDL 50, TSH wnl, UDS wnl, EKG nonacute. Of note she has been dialyzed the last 2 Wed in addition to TTS schedule due to volume overload.  Pending formal reconciliation by pharmacy, but [place] List includes levothyroxine, aspirin, albuterol, amlodipine, Simbrinza eye drops, Cinacalcet, Vit D, Lasix, Gabapentin, Insulin, Toprol 100mg, Prilosec, Zocor 10, Travatan, Advair, Oxycodone, Calcium, Spiriva   Past Medical History:  Diagnosis Date  . Arthritis   . Asthma   . Chronic back pain   . Diabetes mellitus (HCC)   . ESRD (end stage renal disease) on dialysis (HCC)   . Essential hypertension   . GERD (gastroesophageal reflux disease)   . Glaucoma   . Hyperlipidemia   . Hypothyroidism   . Morbid obesity (HCC)   . Peripheral neuropathy (HCC)   . Sleep apnea       Surgical History:  Past Surgical History:  Procedure Laterality Date  . AV FISTULA PLACEMENT Left 04/2014  . KNEE SURGERY    . TUBAL LIGATION       Home Meds: PENDING verification as patient is unsure.  Prior to Admission medications   Medication Sig Start Date End Date Taking? Authorizing Provider  acetaminophen (TYLENOL) 325 MG tablet Take 650 mg by mouth every 6 (six) hours as needed for mild pain.   Yes Historical Provider, MD    insulin lispro (HUMALOG) 100 UNIT/ML injection Inject 0-10 Units into the skin 3 (three) times daily before meals.   Yes Historical Provider, MD  Oxycodone HCl 10 MG TABS Take 10 mg by mouth 4 (four) times daily as needed for pain. 01/01/16  Yes Historical Provider, MD    Inpatient Medications:  . aspirin EC  325 mg Oral Daily  . enoxaparin (LOVENOX) injection  40 mg Subcutaneous Q24H  . insulin aspart  0-15 Units  Subcutaneous TID WC      Allergies: Not on File  Social History   Social History  . Marital status: Single    Spouse name: N/A  . Number of children: N/A  . Years of education: N/A   Occupational History  . disabled    Social History Main Topics  . Smoking status: Former Smoker  . Smokeless tobacco: Never Used     Comment: Smoked x 2 yrs  . Alcohol use No     Comment: years ago - has since quit  . Drug use: No  . Sexual activity: Not on file   Other Topics Concern  . Not on file   Social History Narrative  . No narrative on file     Family History  Problem Relation Age of Onset  . Diabetes Mother   . Hyperlipidemia Mother   . Kidney disease Father      Review of Systems: All other systems reviewed and are otherwise negative except as noted above.  Labs:  Recent Labs  03/16/16 2040 03/17/16 0514  TROPONINI <0.03 <0.03     Lab Results  Component Value Date   CHOL 118 03/16/2016   HDL 31 (L) 03/16/2016   LDLCALC 50 03/16/2016   TRIG 186 (H) 03/16/2016   As above  Radiology/Studies:  CXR as above  Wt Readings from Last 3 Encounters:  03/16/16 268 lb 1.3 oz (121.6 kg)    EKG: NSR 73bpm, no acute ST-T changes  Physical Exam: Blood pressure (!) 99/57, pulse 71, temperature 98.1 F (36.7 C), temperature source Oral, resp. rate 17, height 5' 2" (1.575 m), weight 268 lb 1.3 oz (121.6 kg), SpO2 97 %. Body mass index is 49.03 kg/m. General: Well developed morbidly obese AAF in no acute distress. Appears older than stated age. Skin with acanthosis nigricans Head: Normocephalic, atraumatic, sclera non-icteric, no xanthomas, nares are without discharge.  Neck: Negative for carotid bruits. JVD not elevated. Lungs: Clear bilaterally to auscultation without wheezes, rales, or rhonchi. Breathing is unlabored. Heart: RRR with S1 S2. No murmurs, rubs, or gallops appreciated. Abdomen: Soft, non-tender, non-distended with normoactive bowel sounds. No  hepatomegaly. No rebound/guarding. No obvious abdominal masses. Msk:  Strength and tone appear normal for age. Extremities: No clubbing or cyanosis. No edema.  Distal pedal pulses are 2+ and equal bilaterally. Neuro: Alert and oriented X 3. No facial asymmetry. No focal deficit. Moves all extremities spontaneously. Psych:  Responds to questions appropriately with a normal affect.     Assessment and Plan  45 y.o. morbidly obese AAF with history of ESRD on HD TTS, HTN, HLD, diabetes mellitus c/b peripheral neuropathy, asthma, chronic back pain, arthritis, OSA (reports compliance with CPAP), hypothyroidism, and glaucoma presented with multiple episodes of weakness and chest pressure, r/o for MI, found to have softer blood pressure and leukocytosis with WBC 15k.  1. Weakness/chest pressure - question whether weakness was related to hypotension. Home med rec not yet finalized but antihypertensives have not been re-ordered at this time. Has had 3   episodes of chest pressure over the last month with multiple cardiac risk factors. Continues on ASA at this time. Will review plan for further workup with MD. Noninvasive testing may prove challenging given her body habitus.  2. ESRD on HD - per nephrology. Per IM notes, has required HD last 2 Wednesdays in addition to TTS due to volume overload. Will check echo to eval for cardiomyopathy.  3. HTN - as above.  4. Leukocytosis - ? Etiology. Further per IM. Will order f/u labs for AM.  5. Hyperlipidemia - recommend continue statin once dose verified.  Signed, Dayna N Dunn PA-C 03/17/2016, 11:05 AM Pager: [phone]  I have personally seen and examined this patient with Dayna Dunn, PA-C. I agree with the assessment and plan as outlined above. She is admitted with chest pain. Troponin negative. EKG without ischemic changes. Many risk factors for CAD including DM, HTN, HLD, obesity and ESRD on HD. Exam shows a morbidly obese female in NAD. CV:RRR with no  murmurs. Lungs: clear bilaterally. Ext: no LE edema. Labs reviewed. EKG reviewed by me and shows NSR with no ischemic changes.  Cannot exclude unstable angina. She will need an ischemic workup. We have discussed cath vs stress testing. Given her morbid obesity, I do not think a nuclear stress test is the best option. Will plan cardiac cath later today. Risks and benefits of procedure are reviewed with the patient. She agrees to proceed. Groin approach since she is ESRD on HD.   Christopher McAlhany 03/17/2016 11:29 AM    

## 2016-03-17 NOTE — Progress Notes (Signed)
Pt stated she took several meds. At home. Talked to pharmacy who said they would send consult in the morning but couldn't do anything tonight. Talked to MD who said needed to wait until pharmacy consult in morning. Pt stated nighttime meds. Were blood pressure pill, statin and neurotin. MD aware and Blood pressure stable at 120/78.

## 2016-03-17 NOTE — Progress Notes (Signed)
PROGRESS NOTE    [name]  GXQ:[id number] DOB: 07-24-1970 DOA: 03/16/2016 PCP: Welford Roche, NP  Consultants:  Peri Jefferson - endocrinology; Justin Mend - nephrology; [facility]aker - ophthalmology; vascular Patient coming from: home - lives alone; daughter, [phone]   Brief Narrative:  Wanda Ramirez is a 45 y.o. female with medical history significant of ESRD on HD; DM; HTN; HLD presenting with chest pain as a transfer from [facility].  Patient was in church this AM and felt very weak, nauseated.  Chest tightness.  Went to [facility] and they did some lab work - other than creatinine all looked okay.  Gets HD on Tu/Th/Sat - but also went Wednesday the last 2 weeks because they have been trying to draw off extra fluid.  Substernal chest pressure associated with SOB.  Chest pressure resolved spontaneously and has not recurred.  No diaphoresis.  Unsure if she has had a stress test recently.  Has never had a catheterization.    Assessment & Plan:   Principal Problem:   Chest pain Active Problems:   ESRD on dialysis ([place])   Diabetes mellitus type 2 in obese [facility])   Essential hypertension   Hypothyroidism   OSA (obstructive sleep apnea)  Chest pain -TIMI risk score is 3; which predicts a 14 days risk of death, recurrent MI, or urgent revascularization of 13.2%.  -EKG: NSR, no ST-T changes indicative for ischemia  -Trend troponin; negative so far  -Currently chest pain free  -ASA daily -Cardiology consulted   ESRD -For Tu/Th/Sat HD - but has been dialyzed the last 2 Wednesdays as well, due to volume overload -Nephrology consulted   DM type 2  -Patient does not know her home medications. Pharmacy consulted for med rec.  -Will cover with moderate-dose SSI (balancing renal disease with obesity) -Ha1c pending   Essential HTN -Well controlled -Pharmacy for med rec   Hypothyroidism -TSH wnl  -Pharmacy for med rec   OSA -CPAP qhs   DVT prophylaxis:  lovenox Code Status: full Family Communication: no family at bedside Disposition Plan: pending further work up. Anticipate return to home.    Consultants:   Cardiology  Nephrology  Procedures:   None  Antimicrobials:   None    Subjective: Patient doing well this morning. She states that she had central chest tightness at church for about 20 minutes which resolved spontaneously. She had this symptoms before about 3 weeks ago and was evaluated at [facility]. She thinks she just had EKG work up at that time and was sent home. Her chest pain was associated with nausea and shortness of breath. Currently chest pain free.   Objective: Vitals:   03/16/16 1803 03/16/16 2045 03/17/16 0547 03/17/16 0911  BP: 104/63 120/78 103/64 (!) 99/57  Pulse: 73 77 75 71  Resp: 18 18 18 17   Temp: 98.7 F (37.1 C) 98.2 F (36.8 C) 97.5 F (36.4 C) 98.1 F (36.7 C)  TempSrc: Oral Oral Oral Oral  SpO2: 98% 98% 98% 97%  Weight: 121.6 kg (268 lb 1.3 oz)     Height: 5\' 2"  (1.575 m)       Intake/Output Summary (Last 24 hours) at 03/17/16 0936 Last data filed at 03/17/16 0600  Gross per 24 hour  Intake              380 ml  Output              351 ml  Net  29 ml   Filed Weights   03/16/16 1803  Weight: 121.6 kg (268 lb 1.3 oz)    Examination:  General exam: Appears calm and comfortable  Respiratory system: Clear to auscultation. Respiratory effort normal. Cardiovascular system: S1 & S2 heard, RRR. No JVD, murmurs, rubs, gallops or clicks. No pedal edema. Gastrointestinal system: Abdomen is nondistended, soft and nontender. No organomegaly or masses felt. Normal bowel sounds heard. Central nervous system: Alert and oriented. No focal neurological deficits. Extremities: Symmetric 5 x 5 power. Left upper extremity AVF  Skin: No rashes, lesions or ulcers Psychiatry: Judgement and insight appear normal. Mood & affect appropriate.   Data Reviewed: I have personally reviewed  following labs and imaging studies  CBC: No results for input(s): WBC, NEUTROABS, HGB, HCT, MCV, PLT in the last 168 hours. Basic Metabolic Panel: No results for input(s): NA, K, CL, CO2, GLUCOSE, BUN, CREATININE, CALCIUM, MG, PHOS in the last 168 hours. GFR: CrCl cannot be calculated (No order found.). Liver Function Tests: No results for input(s): AST, ALT, ALKPHOS, BILITOT, PROT, ALBUMIN in the last 168 hours. No results for input(s): LIPASE, AMYLASE in the last 168 hours. No results for input(s): AMMONIA in the last 168 hours. Coagulation Profile: No results for input(s): INR, PROTIME in the last 168 hours. Cardiac Enzymes:  Recent Labs Lab 03/16/16 2040 03/17/16 0514  TROPONINI <0.03 <0.03   BNP (last 3 results) No results for input(s): PROBNP in the last 8760 hours. HbA1C: No results for input(s): HGBA1C in the last 72 hours. CBG:  Recent Labs Lab 03/16/16 2032 03/17/16 0803  GLUCAP 88 145*   Lipid Profile:  Recent Labs  03/16/16 2320  CHOL 118  HDL 31*  LDLCALC 50  TRIG 186*  CHOLHDL 3.8   Thyroid Function Tests:  Recent Labs  03/16/16 2320  TSH 1.549   Anemia Panel: No results for input(s): VITAMINB12, FOLATE, FERRITIN, TIBC, IRON, RETICCTPCT in the last 72 hours. Sepsis Labs: No results for input(s): PROCALCITON, LATICACIDVEN in the last 168 hours.  No results found for this or any previous visit (from the past 240 hour(s)).     Radiology Studies: No results found.    Scheduled Meds: . aspirin EC  325 mg Oral Daily  . enoxaparin (LOVENOX) injection  40 mg Subcutaneous Q24H  . insulin aspart  0-15 Units Subcutaneous TID WC   Continuous Infusions:    LOS: 0 days    Time spent: 40 minutes   Dessa Phi, DO [facility] [email or web address] Password [email or web address] 03/17/2016, 9:36 AM

## 2016-03-18 ENCOUNTER — Encounter (HOSPITAL_COMMUNITY)
Admission: AD | Disposition: A | Discharge status: Home / Self Care | Payer: Self-pay | Source: Other Acute Inpatient Hospital | Attending: Internal Medicine

## 2016-03-18 ENCOUNTER — Encounter (HOSPITAL_COMMUNITY): Payer: Self-pay | Admitting: Cardiovascular Disease

## 2016-03-18 DIAGNOSIS — N186 End stage renal disease: Secondary | ICD-10-CM | POA: Diagnosis not present

## 2016-03-18 DIAGNOSIS — E784 Other hyperlipidemia: Secondary | ICD-10-CM

## 2016-03-18 DIAGNOSIS — R072 Precordial pain: Secondary | ICD-10-CM | POA: Diagnosis not present

## 2016-03-18 DIAGNOSIS — E1169 Type 2 diabetes mellitus with other specified complication: Secondary | ICD-10-CM | POA: Diagnosis not present

## 2016-03-18 DIAGNOSIS — I10 Essential (primary) hypertension: Secondary | ICD-10-CM | POA: Diagnosis not present

## 2016-03-18 DIAGNOSIS — R0789 Other chest pain: Secondary | ICD-10-CM | POA: Diagnosis not present

## 2016-03-18 LAB — CBC
HCT: 29.2 % — ABNORMAL LOW (ref 36.0–46.0)
Hemoglobin: 9.8 g/dL — ABNORMAL LOW (ref 12.0–15.0)
MCH: 28.7 pg (ref 26.0–34.0)
MCHC: 33.6 g/dL (ref 30.0–36.0)
MCV: 85.4 fL (ref 78.0–100.0)
PLATELETS: 254 10*3/uL (ref 150–400)
RBC: 3.42 MIL/uL — ABNORMAL LOW (ref 3.87–5.11)
RDW: 14.5 % (ref 11.5–15.5)
WBC: 9.3 10*3/uL (ref 4.0–10.5)

## 2016-03-18 LAB — GLUCOSE, CAPILLARY
GLUCOSE-CAPILLARY: 171 mg/dL — AB (ref 65–99)
GLUCOSE-CAPILLARY: 197 mg/dL — AB (ref 65–99)
Glucose-Capillary: 175 mg/dL — ABNORMAL HIGH (ref 65–99)
Glucose-Capillary: 201 mg/dL — ABNORMAL HIGH (ref 65–99)

## 2016-03-18 LAB — BASIC METABOLIC PANEL
Anion gap: 15 (ref 5–15)
BUN: 80 mg/dL — ABNORMAL HIGH (ref 6–20)
CALCIUM: 7.7 mg/dL — AB (ref 8.9–10.3)
CO2: 21 mmol/L — ABNORMAL LOW (ref 22–32)
CREATININE: 10.64 mg/dL — AB (ref 0.44–1.00)
Chloride: 98 mmol/L — ABNORMAL LOW (ref 101–111)
GFR, EST AFRICAN AMERICAN: 4 mL/min — AB (ref >60)
GFR, EST NON AFRICAN AMERICAN: 4 mL/min — AB (ref >60)
Glucose, Bld: 225 mg/dL — ABNORMAL HIGH (ref 65–99)
Potassium: 4.8 mmol/L (ref 3.5–5.1)
SODIUM: 134 mmol/L — AB (ref 135–145)

## 2016-03-18 LAB — HEMOGLOBIN A1C
Hgb A1c MFr Bld: 7.9 % — ABNORMAL HIGH (ref 4.8–5.6)
MEAN PLASMA GLUCOSE: 180 mg/dL

## 2016-03-18 SURGERY — LEFT HEART CATH AND CORONARY ANGIOGRAPHY

## 2016-03-18 MED ORDER — AMLODIPINE BESYLATE 10 MG PO TABS: 10.0000 mg | ORAL_TABLET | Freq: Every day | ORAL | Status: DC

## 2016-03-18 MED ORDER — PANTOPRAZOLE SODIUM 40 MG PO TBEC
40.0000 mg | DELAYED_RELEASE_TABLET | Freq: Every day | ORAL | Status: DC
  Administered 2016-03-18: 40 mg via ORAL
  Filled 2016-03-18: qty 1

## 2016-03-18 MED ORDER — LEVOTHYROXINE SODIUM 25 MCG PO TABS
125.0000 ug | ORAL_TABLET | Freq: Every day | ORAL | Status: DC
  Administered 2016-03-18: 125 ug via ORAL
  Filled 2016-03-18: qty 1

## 2016-03-18 MED ORDER — TIOTROPIUM BROMIDE MONOHYDRATE 18 MCG IN CAPS
18.0000 ug | ORAL_CAPSULE | Freq: Every day | RESPIRATORY_TRACT | Status: DC
  Filled 2016-03-18 (×2): qty 5

## 2016-03-18 MED ORDER — METOPROLOL SUCCINATE ER 100 MG PO TB24: 100.0000 mg | ORAL_TABLET | Freq: Every day | ORAL | Status: DC

## 2016-03-18 MED ORDER — SIMVASTATIN 10 MG PO TABS
10.0000 mg | ORAL_TABLET | Freq: Every day | ORAL | Status: DC
  Administered 2016-03-18: 10 mg via ORAL
  Filled 2016-03-18: qty 1

## 2016-03-18 MED ORDER — ASPIRIN EC 81 MG PO TBEC: 81.0000 mg | DELAYED_RELEASE_TABLET | Freq: Every day | ORAL | Status: DC

## 2016-03-18 NOTE — Discharge Summary (Signed)
Physician Discharge Summary  Wanda Ramirez HCW:[id number] DOB: [birth year] DOA: 03/16/2016  PCP: Welford Roche, NP  Admit date: 03/16/2016 Discharge date: 03/18/2016  Admitted From: Home Disposition:  Home  Recommendations for Outpatient Follow-up:  1. Follow up with PCP in 1-2 weeks  Home Health: No  Equipment/Devices: None   Discharge Condition: Stable CODE STATUS: Full  Diet recommendation: Renal   Brief/Interim Summary: Wanda L Alstonis a 45 y.o.femalewith medical history significant of ESRD on HD; DM; HTN; HLD presenting with chest pain as a transfer from [facility]. Patient was in church this AM and felt very weak, nauseated. Chest tightness. Went to [facility] and they did some lab work - other than creatinine all looked okay. Gets HD on Tu/Th/Sat - but also went Wednesday the last 2 weeks because they have been trying to draw off extra fluid. Substernal chest pressure associated with SOB.  Chest pressure resolved spontaneously and has not recurred. No diaphoresis. Unsure if she has had a stress test recently. Has never had a catheterization.   During admission, troponins were trended which were negative. Cardiology was consulted for evaluation. She underwent cardiac catheterization on 10/16 which showed normal coronary arteries. Her chest pain resolved. She was evaluated by nephrology for dialysis and received dialysis on 10/17 prior to discharge. She should follow-up with her PCP.  Discharge Diagnoses:  Principal Problem:   Chest pain Active Problems:   ESRD on dialysis ([place])   Diabetes mellitus type 2 in obese [facility])   Essential hypertension   Hypothyroidism   OSA (obstructive sleep apnea)   Weakness   Hypotension   Leukocytosis   Hyperlipidemia   Peripheral neuropathy (HCC)   Precordial chest pain  Non-cardiac chest pain -TIMI risk score is 3; which predicts a 14 days risk of death, recurrent MI, or urgent revascularization of 13.2%.   -EKG: NSR, no ST-T changes indicative for ischemia  -Trend troponin; negative   -ASA daily, zocor  -Appreciate Cardiology. S/p cardiac cath 10/16:    The left ventricular systolic function is normal.  LV end diastolic pressure is normal.  The left ventricular ejection fraction is 55-65% by visual estimate.  There is no mitral valve regurgitation.  ESRD -For Tu/Th/Sat HD - but has been dialyzed the last 2 Wednesdays as well, due to volume overload -Nephrology consulted   DM type 2  -Will cover with moderate-dose SSI (balancing renal disease with obesity) -Ha1c 7.9  Essential HTN -Well controlled  Hypothyroidism -TSH wnl  -Synthroid   OSA -CPAP qhs    Discharge Instructions  Discharge Instructions    Call MD for:    Complete by:  As directed    Chest pain, shortness of breath   Diet - low sodium heart healthy    Complete by:  As directed    Increase activity slowly    Complete by:  As directed        Medication List    TAKE these medications   acetaminophen 325 MG tablet Commonly known as:  TYLENOL Take 650 mg by mouth every 6 (six) hours as needed for mild pain.   amLODipine 10 MG tablet Commonly known as:  NORVASC Take 10 mg by mouth at bedtime.   aspirin EC 81 MG tablet Take 81 mg by mouth daily.   cinacalcet 60 MG tablet Commonly known as:  SENSIPAR Take 60 mg by mouth daily.   gabapentin 300 MG capsule Commonly known as:  NEURONTIN Take 300 mg by mouth at bedtime.  insulin lispro 100 UNIT/ML injection Commonly known as:  HUMALOG Inject 0-10 Units into the skin 3 (three) times daily before meals.   levothyroxine 125 MCG tablet Commonly known as:  SYNTHROID, LEVOTHROID Take 125 mcg by mouth daily before breakfast.   metoprolol succinate 50 MG 24 hr tablet Commonly known as:  TOPROL-XL Take 100 mg by mouth at bedtime. Take with or immediately following a meal.   omeprazole 40 MG capsule Commonly known as:  PRILOSEC Take 40 mg by  mouth daily.   Oxycodone HCl 10 MG Tabs Take 10 mg by mouth 4 (four) times daily as needed for pain.   SIMBRINZA 1-0.2 % Susp Generic drug:  Brinzolamide-Brimonidine Place 2 drops into both eyes daily.   simvastatin 10 MG tablet Commonly known as:  ZOCOR Take 10 mg by mouth daily.   tiotropium 18 MCG inhalation capsule Commonly known as:  SPIRIVA Place 18 mcg into inhaler and inhale daily.   Travoprost (BAK Free) 0.004 % Soln ophthalmic solution Commonly known as:  TRAVATAN Place 2 drops into both eyes at bedtime.      Follow-up Information    MCRAE, Ander Purpura, NP. Schedule an appointment as soon as possible for a visit in 1 week(s).   Specialty:  Gerontology Contact information: [place] [address] [phone]          No Known Allergies  Consultations:  Cardiology  Nephrology    Procedures/Studies: Left Heart Cath and Coronary Angiography  Conclusion     The left ventricular systolic function is normal.  LV end diastolic pressure is normal.  The left ventricular ejection fraction is 55-65% by visual estimate.  There is no mitral valve regurgitation.   1. No angiographic evidence of CAD 2. Normal LV systolic function 3. Non-cardiac chest pain  Recommendations: No further ischemic workup   Subjective: Patient doing well this morning. She denies any recurrence of chest pain, no shortness of breath. She denies any fevers, nausea, vomiting, diarrhea, abdominal pain, dysuria.    Discharge Exam: Vitals:   03/18/16 0037 03/18/16 0517  BP:  (!) 95/55  Pulse: 83 69  Resp: 16 16  Temp:  97.6 F (36.4 C)   Vitals:   03/17/16 1704 03/17/16 2102 03/18/16 0037 03/18/16 0517  BP: 116/62 (!) 98/52  (!) 95/55  Pulse: 81 74 83 69  Resp: 16 16 16 16   Temp:  97.8 F (36.6 C)  97.6 F (36.4 C)  TempSrc:  Oral  Oral  SpO2: 99% 97% 98% 98%  Weight:    119.3 kg (263 lb)  Height:    5\' 2"  (1.575 m)     General exam:  Appears calm and comfortable  Respiratory system: Clear to auscultation. Respiratory effort normal. Cardiovascular system: S1 & S2 heard, RRR. No JVD, murmurs, rubs, gallops or clicks. No pedal edema. Gastrointestinal system: Abdomen is nondistended, soft and nontender. No organomegaly or masses felt. Normal bowel sounds heard. Central nervous system: Alert and oriented. No focal neurological deficits. Extremities: Symmetric 5 x 5 power. Left upper extremity AVF  Skin: No rashes, lesions or ulcers Psychiatry: Judgement and insight appear normal. Mood & affect appropriate.     The results of significant diagnostics from this hospitalization (including imaging, microbiology, ancillary and laboratory) are listed below for reference.     Microbiology: No results found for this or any previous visit (from the past 240 hour(s)).   Labs: BNP (last 3 results) No results for input(s): BNP in the  last 8760 hours. Basic Metabolic Panel:  Recent Labs Lab 03/18/16 0309  NA 134*  K 4.8  CL 98*  CO2 21*  GLUCOSE 225*  BUN 80*  CREATININE 10.64*  CALCIUM 7.7*   Liver Function Tests: No results for input(s): AST, ALT, ALKPHOS, BILITOT, PROT, ALBUMIN in the last 168 hours. No results for input(s): LIPASE, AMYLASE in the last 168 hours. No results for input(s): AMMONIA in the last 168 hours. CBC:  Recent Labs Lab 03/18/16 0309  WBC 9.3  HGB 9.8*  HCT 29.2*  MCV 85.4  PLT 254   Cardiac Enzymes:  Recent Labs Lab 03/16/16 2040 03/17/16 0514 03/17/16 1210  TROPONINI <0.03 <0.03 <0.03   BNP: Invalid input(s): POCBNP CBG:  Recent Labs Lab 03/17/16 1515 03/17/16 1702 03/17/16 2059 03/18/16 0723 03/18/16 1108  GLUCAP 102* 103* 219* 171* 197*   D-Dimer No results for input(s): DDIMER in the last 72 hours. Hgb A1c  Recent Labs  03/16/16 2040  HGBA1C 7.9*   Lipid Profile  Recent Labs  03/16/16 2320  CHOL 118  HDL 31*  LDLCALC 50  TRIG 186*  CHOLHDL 3.8    Thyroid function studies  Recent Labs  03/16/16 2320  TSH 1.549   Anemia work up No results for input(s): VITAMINB12, FOLATE, FERRITIN, TIBC, IRON, RETICCTPCT in the last 72 hours. Urinalysis No results found for: COLORURINE, APPEARANCEUR, LABSPEC, [place], GLUCOSEU, HGBUR, BILIRUBINUR, KETONESUR, PROTEINUR, UROBILINOGEN, NITRITE, LEUKOCYTESUR Sepsis Labs Invalid input(s): PROCALCITONIN,  WBC,  LACTICIDVEN Microbiology No results found for this or any previous visit (from the past 240 hour(s)).   Time coordinating discharge: Over 30 minutes  SIGNED:  Dessa Phi, DO [facility] Pager [phone]  If 7PM-7AM, please contact night-coverage [email or web address] Password [email or web address] 03/18/2016, 12:36 PM

## 2016-03-18 NOTE — Progress Notes (Addendum)
     SUBJECTIVE: No chest pain or dyspnea.   Tele: sinus  BP (!) 95/55 (BP Location: Right Arm)   Pulse 69   Temp 97.6 F (36.4 C) (Oral)   Resp 16   Ht 5\' 2"  (1.575 m)   Wt 263 lb (119.3 kg)   SpO2 98%   BMI 48.10 kg/m   Intake/Output Summary (Last 24 hours) at 03/18/16 1059 Last data filed at 03/18/16 0528  Gross per 24 hour  Intake               40 ml  Output              450 ml  Net             -410 ml    PHYSICAL EXAM General: Well developed, well nourished, in no acute distress. Alert and oriented x 3.  Psych:  Good affect, responds appropriately Neck: No JVD. No masses noted.  Lungs: Clear bilaterally with no wheezes or rhonci noted.  Heart: RRR with no murmurs noted. Abdomen: Bowel sounds are present. Soft, non-tender.  Extremities: No lower extremity edema.   LABS: Basic Metabolic Panel:  Recent Labs  03/18/16 0309  NA 134*  K 4.8  CL 98*  CO2 21*  GLUCOSE 225*  BUN 80*  CREATININE 10.64*  CALCIUM 7.7*   CBC:  Recent Labs  03/18/16 0309  WBC 9.3  HGB 9.8*  HCT 29.2*  MCV 85.4  PLT 254   Cardiac Enzymes:  Recent Labs  03/16/16 2040 03/17/16 0514 03/17/16 1210  TROPONINI <0.03 <0.03 <0.03   Fasting Lipid Panel:  Recent Labs  03/16/16 2320  CHOL 118  HDL 31*  LDLCALC 50  TRIG 186*  CHOLHDL 3.8    Current Meds: . aspirin EC  325 mg Oral Daily  . insulin aspart  0-15 Units Subcutaneous TID WC  . levothyroxine  125 mcg Oral QAC breakfast  . pantoprazole  40 mg Oral Daily  . simvastatin  10 mg Oral Daily  . tiotropium  18 mcg Inhalation Daily    ASSESSMENT AND PLAN:  1. Chest pain: This is felt to be non-cardiac. Cardiac cath 03/17/16 with no evidence of CAD. No further ischemic workup.  OK to d/c home from a cardiac standpoint.     Christopher McAlhany  10/17/201710:59 AM

## 2016-03-18 NOTE — Progress Notes (Signed)
Patient returned from hemodialysis. Had 4 liters removed. Vital signs WNL. No complaints from patient. Denies pain. Has orders to be discharged after hemodialysis. Removed IV access on R arm. Gave patient and family at bedside discharge instructions. Instructions of cardiac cath site was given. R groin Level 0. Patient denied any further questions and verbalized understanding. Patient discharged via wheelchair.

## 2016-03-18 NOTE — Progress Notes (Signed)
Spoke with Nephrology. They will see her today. I'm hoping that if she is able to get dialysis today, she can be discharge later this evening.    Dessa Phi, DO [facility] [email or web address] Password [email or web address] 03/18/2016, 3:00 PM

## 2016-03-18 NOTE — Progress Notes (Signed)
CPAP set up at bedside and patient placed on 10.0 cm H20 per home settings via [facility].  Tolerating well at this time. RN aware.

## 2016-03-26 DIAGNOSIS — Z0184 Encounter for antibody response examination: Secondary | ICD-10-CM | POA: Insufficient documentation

## 2017-11-10 ENCOUNTER — Emergency Department (HOSPITAL_COMMUNITY): Payer: Medicaid Other

## 2017-11-10 ENCOUNTER — Encounter (HOSPITAL_COMMUNITY): Payer: Self-pay | Admitting: Emergency Medicine

## 2017-11-10 ENCOUNTER — Emergency Department (HOSPITAL_COMMUNITY)
Admission: EM | Admit: 2017-11-10 | Discharge: 2017-11-10 | Disposition: A | Discharge status: Home / Self Care | Payer: Medicaid Other | Attending: Emergency Medicine | Admitting: Emergency Medicine

## 2017-11-10 ENCOUNTER — Other Ambulatory Visit: Payer: Self-pay

## 2017-11-10 DIAGNOSIS — J45909 Unspecified asthma, uncomplicated: Secondary | ICD-10-CM | POA: Diagnosis not present

## 2017-11-10 DIAGNOSIS — Z79899 Other long term (current) drug therapy: Secondary | ICD-10-CM | POA: Insufficient documentation

## 2017-11-10 DIAGNOSIS — R569 Unspecified convulsions: Secondary | ICD-10-CM

## 2017-11-10 DIAGNOSIS — N186 End stage renal disease: Secondary | ICD-10-CM | POA: Diagnosis not present

## 2017-11-10 DIAGNOSIS — Z87891 Personal history of nicotine dependence: Secondary | ICD-10-CM | POA: Insufficient documentation

## 2017-11-10 DIAGNOSIS — Z794 Long term (current) use of insulin: Secondary | ICD-10-CM | POA: Insufficient documentation

## 2017-11-10 DIAGNOSIS — R0602 Shortness of breath: Secondary | ICD-10-CM | POA: Insufficient documentation

## 2017-11-10 DIAGNOSIS — Z7982 Long term (current) use of aspirin: Secondary | ICD-10-CM | POA: Insufficient documentation

## 2017-11-10 DIAGNOSIS — E1122 Type 2 diabetes mellitus with diabetic chronic kidney disease: Secondary | ICD-10-CM | POA: Insufficient documentation

## 2017-11-10 DIAGNOSIS — I120 Hypertensive chronic kidney disease with stage 5 chronic kidney disease or end stage renal disease: Secondary | ICD-10-CM | POA: Insufficient documentation

## 2017-11-10 DIAGNOSIS — Z992 Dependence on renal dialysis: Secondary | ICD-10-CM | POA: Diagnosis not present

## 2017-11-10 DIAGNOSIS — E039 Hypothyroidism, unspecified: Secondary | ICD-10-CM | POA: Insufficient documentation

## 2017-11-10 LAB — CBC WITH DIFFERENTIAL/PLATELET
Abs Immature Granulocytes: 0.1 10*3/uL (ref 0.0–0.1)
Basophils Absolute: 0 10*3/uL (ref 0.0–0.1)
Basophils Relative: 0 %
EOS ABS: 0.2 10*3/uL (ref 0.0–0.7)
EOS PCT: 2 %
HCT: 30.4 % — ABNORMAL LOW (ref 36.0–46.0)
HEMOGLOBIN: 9.8 g/dL — AB (ref 12.0–15.0)
IMMATURE GRANULOCYTES: 1 %
LYMPHS PCT: 17 %
Lymphs Abs: 1.9 10*3/uL (ref 0.7–4.0)
MCH: 27.5 pg (ref 26.0–34.0)
MCHC: 32.2 g/dL (ref 30.0–36.0)
MCV: 85.2 fL (ref 78.0–100.0)
Monocytes Absolute: 0.6 10*3/uL (ref 0.1–1.0)
Monocytes Relative: 6 %
NEUTROS PCT: 74 %
Neutro Abs: 7.8 10*3/uL — ABNORMAL HIGH (ref 1.7–7.7)
Platelets: 271 10*3/uL (ref 150–400)
RBC: 3.57 MIL/uL — AB (ref 3.87–5.11)
RDW: 15.9 % — AB (ref 11.5–15.5)
WBC: 10.6 10*3/uL — ABNORMAL HIGH (ref 4.0–10.5)

## 2017-11-10 LAB — COMPREHENSIVE METABOLIC PANEL
ALBUMIN: 3.4 g/dL — AB (ref 3.5–5.0)
ALT: 20 U/L (ref 14–54)
AST: 21 U/L (ref 15–41)
Alkaline Phosphatase: 123 U/L (ref 38–126)
Anion gap: 11 (ref 5–15)
BUN: 56 mg/dL — AB (ref 6–20)
CALCIUM: 7.8 mg/dL — AB (ref 8.9–10.3)
CO2: 24 mmol/L (ref 22–32)
CREATININE: 7.85 mg/dL — AB (ref 0.44–1.00)
Chloride: 102 mmol/L (ref 101–111)
GFR calc non Af Amer: 6 mL/min — ABNORMAL LOW (ref >60)
GFR, EST AFRICAN AMERICAN: 6 mL/min — AB (ref >60)
Glucose, Bld: 226 mg/dL — ABNORMAL HIGH (ref 65–99)
Potassium: 4.3 mmol/L (ref 3.5–5.1)
Sodium: 137 mmol/L (ref 135–145)
TOTAL PROTEIN: 6.7 g/dL (ref 6.5–8.1)
Total Bilirubin: 0.4 mg/dL (ref 0.3–1.2)

## 2017-11-10 LAB — URINALYSIS, ROUTINE W REFLEX MICROSCOPIC
Bilirubin Urine: NEGATIVE
GLUCOSE, UA: 50 mg/dL — AB
KETONES UR: NEGATIVE mg/dL
Nitrite: NEGATIVE
PH: 5 (ref 5.0–8.0)
PROTEIN: 30 mg/dL — AB
Specific Gravity, Urine: 1.015 (ref 1.005–1.030)

## 2017-11-10 LAB — I-STAT CHEM 8, ED
BUN: 53 mg/dL — ABNORMAL HIGH (ref 6–20)
CALCIUM ION: 0.97 mmol/L — AB (ref 1.15–1.40)
CREATININE: 7.5 mg/dL — AB (ref 0.44–1.00)
Chloride: 101 mmol/L (ref 101–111)
GLUCOSE: 227 mg/dL — AB (ref 65–99)
HCT: 30 % — ABNORMAL LOW (ref 36.0–46.0)
HEMOGLOBIN: 10.2 g/dL — AB (ref 12.0–15.0)
Potassium: 4.2 mmol/L (ref 3.5–5.1)
Sodium: 137 mmol/L (ref 135–145)
TCO2: 23 mmol/L (ref 22–32)

## 2017-11-10 LAB — RAPID URINE DRUG SCREEN, HOSP PERFORMED
AMPHETAMINES: NOT DETECTED
BARBITURATES: NOT DETECTED
BENZODIAZEPINES: NOT DETECTED
COCAINE: NOT DETECTED
Opiates: NOT DETECTED
Tetrahydrocannabinol: NOT DETECTED

## 2017-11-10 LAB — I-STAT TROPONIN, ED: TROPONIN I, POC: 0 ng/mL (ref 0.00–0.08)

## 2017-11-10 LAB — I-STAT BETA HCG BLOOD, ED (MC, WL, AP ONLY)

## 2017-11-10 MED ORDER — SODIUM CHLORIDE 0.9 % IV SOLN: 10.0000 mg/kg | Freq: Once | INTRAVENOUS | Status: DC

## 2017-11-10 MED ORDER — SODIUM CHLORIDE 0.9 % IV SOLN
1500.0000 mg | Freq: Once | INTRAVENOUS | Status: AC
  Administered 2017-11-10: 1500 mg via INTRAVENOUS
  Filled 2017-11-10: qty 30

## 2017-11-10 MED ORDER — IPRATROPIUM-ALBUTEROL 0.5-2.5 (3) MG/3ML IN SOLN
3.0000 mL | Freq: Once | RESPIRATORY_TRACT | Status: AC
  Administered 2017-11-10: 3 mL via RESPIRATORY_TRACT
  Filled 2017-11-10: qty 3

## 2017-11-10 MED ORDER — PHENYTOIN SODIUM EXTENDED 100 MG PO CAPS: 100.0000 mg | ORAL_CAPSULE | Freq: Three times a day (TID) | ORAL | 0 refills | Status: DC

## 2017-11-10 MED ORDER — PHENYTOIN 50 MG PO CHEW: 100.0000 mg | CHEWABLE_TABLET | Freq: Three times a day (TID) | ORAL | Status: DC

## 2017-11-10 NOTE — Discharge Instructions (Addendum)
Start dilantin as prescribed. Please follow up with your family doctor or neurology for further evaluation and treatment and dilantin level next week. Return if any problem

## 2017-11-10 NOTE — ED Notes (Signed)
IV team at bedside to deaccess site.

## 2017-11-10 NOTE — Progress Notes (Signed)
MEDICATION RELATED CONSULT NOTE - INITIAL   Pharmacy Consult for Phenytoin Indication: Seizures  Allergies  Allergen Reactions  . Hydrocodone Itching and Nausea And Vomiting    Patient Measurements: Height: 5\' 2"  (157.5 cm) Weight: 263 lb (119.3 kg) IBW/kg (Calculated) : 50.1 Adjusted Body Weight: 77kg  Vital Signs: Temp: 98.8 F (37.1 C) (06/11 0841) Temp Source: Oral (06/11 0841) BP: 118/77 (06/11 1200) Pulse Rate: 90 (06/11 1200) Intake/Output from previous day: No intake/output data recorded. Intake/Output from this shift: No intake/output data recorded.  Labs: Recent Labs    11/10/17 1001 11/10/17 1010  WBC 10.6*  --   HGB 9.8* 10.2*  HCT 30.4* 30.0*  PLT 271  --   CREATININE 7.85* 7.50*  ALBUMIN 3.4*  --   PROT 6.7  --   AST 21  --   ALT 20  --   ALKPHOS 123  --   BILITOT 0.4  --    Estimated Creatinine Clearance: 11.5 mL/min (A) (by C-G formula based on SCr of 7.5 mg/dL (H)).   Microbiology: No results found for this or any previous visit (from the past 720 hour(s)).  Medical History: Past Medical History:  Diagnosis Date  . Arthritis   . Asthma   . Chronic back pain   . Diabetes mellitus ([place])   . ESRD (end stage renal disease) on dialysis ([place])   . Essential hypertension   . GERD (gastroesophageal reflux disease)   . Glaucoma   . Hyperlipidemia   . Hypothyroidism   . Morbid obesity ([place])   . Peripheral neuropathy   . Sleep apnea      Assessment: 17 yoF admitted with SOB and seizures. Pt has PMH asthma, ESRD on HD, HTN, and DM. Pt had seizure during HD today. Pt previously had seizures years ago and was on phenytoin, not currently on any antiepileptics. Pharmacy consulted for phenytoin loading dose.  Goal of Therapy:  Resolution of seizures  Plan:  -Phenytoin 1500mg  IV load (~20 mg/kg adjusted BW) -Phenytoin 100mg  PO q8h per MD  Arrie Senate, PharmD, BCPS PGY-2 Cardiology Pharmacy Resident Phone: [phone] 11/10/2017

## 2017-11-10 NOTE — ED Notes (Signed)
Encouraged pt to provide urine sample. Pt states she does not make much urine. Will give sample is she is able to

## 2017-11-10 NOTE — ED Provider Notes (Signed)
[facility] DEPARTMENT Provider Note   CSN: [id number] Arrival date & time: 11/10/17  0813     History   Chief Complaint Chief Complaint  Patient presents with  . Seizures  . Shortness of Breath    HPI Wanda Ramirez is a 47 y.o. female.  HPI  Wanda Ramirez is a 47 y.o. female with history of asthma, end-stage renal disease on dialysis, hypertension, diabetes, presents to emergency department complaining of a seizure and shortness of breath.  Patient was in dialysis this morning, when she had a tonic-clonic seizure witnessed for dialysis center staff.  This was just at the beginning of the treatment, she did not finish the treatment.  Patient had another seizure prior to EMS arrival.  Patient is currently complaining of shortness of breath which she had prior to her dialysis.  Her last dialysis was Thursday.  She denies missing any dialysis sessions.  She has not taken any of her medications or use her asthma inhaler this morning.  She denies any new medications, over-the-counter medications, drugs.  She reports history of seizures but states they were "years and years ago."  She states at that time she had quite a few of them and he is to be on Dilantin.  She has not had any seizures in many years.  Denies any potential triggers for the seizure.   Past Medical History:  Diagnosis Date  . Arthritis   . Asthma   . Chronic back pain   . Diabetes mellitus ([place])   . ESRD (end stage renal disease) on dialysis ([place])   . Essential hypertension   . GERD (gastroesophageal reflux disease)   . Glaucoma   . Hyperlipidemia   . Hypothyroidism   . Morbid obesity ([place])   . Peripheral neuropathy   . Sleep apnea     Patient Active Problem List   Diagnosis Date Noted  . Weakness 03/17/2016  . Hypotension 03/17/2016  . Leukocytosis 03/17/2016  . Hyperlipidemia 03/17/2016  . Peripheral neuropathy 03/17/2016  . Precordial chest pain 03/17/2016  . Chest pain  03/16/2016  . ESRD on dialysis ([place]) 03/16/2016  . Diabetes mellitus type 2 in obese ([place]) 03/16/2016  . Essential hypertension 03/16/2016  . Hypothyroidism 03/16/2016  . Glaucoma 03/16/2016  . GERD (gastroesophageal reflux disease) 03/16/2016  . OSA (obstructive sleep apnea) 03/16/2016    Past Surgical History:  Procedure Laterality Date  . AV FISTULA PLACEMENT Left 04/2014  . CARDIAC CATHETERIZATION N/A 03/17/2016   Procedure: Left Heart Cath and Coronary Angiography;  Surgeon: [name], MD;  Location: [facility];  Service: Cardiovascular;  Laterality: N/A;  . KNEE SURGERY    . TUBAL LIGATION       OB History   None      Home Medications    Prior to Admission medications   Medication Sig Start Date End Date Taking? Authorizing Provider  acetaminophen (TYLENOL) 325 MG tablet Take 650 mg by mouth every 6 (six) hours as needed for mild pain.    [provider]  amLODipine (NORVASC) 10 MG tablet Take 10 mg by mouth at bedtime.    [provider]  aspirin EC 81 MG tablet Take 81 mg by mouth daily.    [provider]  Brinzolamide-Brimonidine (SIMBRINZA) 1-0.2 % SUSP Place 2 drops into both eyes daily.    [provider]  cinacalcet (SENSIPAR) 60 MG tablet Take 60 mg by mouth daily.    [provider]  gabapentin (NEURONTIN) 300 MG capsule Take 300 mg by mouth at bedtime.    [provider]  insulin lispro (HUMALOG) 100 UNIT/ML injection Inject 0-10 Units into the skin 3 (three) times daily before meals.    [provider]  levothyroxine (SYNTHROID, LEVOTHROID) 125 MCG tablet Take 125 mcg by mouth daily before breakfast.    [provider]  metoprolol succinate (TOPROL-XL) 50 MG 24 hr tablet Take 100 mg by mouth at bedtime. Take with or immediately following a meal.    [provider]  omeprazole (PRILOSEC) 40 MG capsule Take 40 mg by mouth daily.    [provider]    Oxycodone HCl 10 MG TABS Take 10 mg by mouth 4 (four) times daily as needed for pain. 01/01/16   [provider]  simvastatin (ZOCOR) 10 MG tablet Take 10 mg by mouth daily.    [provider]  tiotropium (SPIRIVA) 18 MCG inhalation capsule Place 18 mcg into inhaler and inhale daily.    [provider]  Travoprost, BAK Free, (TRAVATAN) 0.004 % SOLN ophthalmic solution Place 2 drops into both eyes at bedtime.    [provider]    Family History Family History  Problem Relation Age of Onset  . Diabetes Mother   . Hyperlipidemia Mother   . Kidney disease Father     Social History Social History   Tobacco Use  . Smoking status: Former Research scientist (life sciences)  . Smokeless tobacco: Never Used  . Tobacco comment: Smoked x 2 yrs  Substance Use Topics  . Alcohol use: No    Comment: years ago - has since quit  . Drug use: No     Allergies   Patient has no known allergies.   Review of Systems Review of Systems  Constitutional: Negative for chills and fever.  Respiratory: Positive for cough and shortness of breath. Negative for chest tightness.   Cardiovascular: Negative for chest pain, palpitations and leg swelling.  Gastrointestinal: Negative for abdominal pain, diarrhea, nausea and vomiting.  Genitourinary: Negative for dysuria, flank pain, pelvic pain, vaginal bleeding, vaginal discharge and vaginal pain.  Musculoskeletal: Negative for arthralgias, myalgias, neck pain and neck stiffness.  Skin: Negative for rash.  Neurological: Positive for seizures. Negative for dizziness, weakness and headaches.  All other systems reviewed and are negative.    Physical Exam Updated Vital Signs BP 140/80 (BP Location: Right Arm)   Pulse 93   Temp 98.8 F (37.1 C) (Oral)   Resp (!) 28   Wt 119.3 kg (263 lb)   SpO2 (S) 90% Comment: pt placed on O2 at 2l/m/[place] =94%  BMI 48.10 kg/m   Physical Exam  Constitutional: She is oriented to person, place, and time. She  appears well-developed and well-nourished. No distress.  HENT:  Head: Normocephalic.  Eyes: Conjunctivae are normal.  Neck: Neck supple.  Cardiovascular: Normal rate, regular rhythm and normal heart sounds.  Pulmonary/Chest: Effort normal and breath sounds normal. No respiratory distress. She has no wheezes. She has no rales.  Abdominal: Soft. Bowel sounds are normal. She exhibits no distension. There is no tenderness. There is no rebound.  Musculoskeletal: She exhibits no edema.  Neurological: She is alert and oriented to person, place, and time.  5/5 and equal upper and lower extremity strength bilaterally. Equal grip strength bilaterally. Normal finger to nose and heel to shin. No pronator drift.   Skin: Skin is warm and dry.  Psychiatric: She has a normal mood and affect. Her behavior  is normal.  Nursing note and vitals reviewed.    ED Treatments / Results  Labs (all labs ordered are listed, but only abnormal results are displayed) Labs Reviewed  CBC WITH DIFFERENTIAL/PLATELET - Abnormal; Notable for the following components:      Result Value   WBC 10.6 (*)    RBC 3.57 (*)    Hemoglobin 9.8 (*)    HCT 30.4 (*)    RDW 15.9 (*)    Neutro Abs 7.8 (*)    All other components within normal limits  COMPREHENSIVE METABOLIC PANEL - Abnormal; Notable for the following components:   Glucose, Bld 226 (*)    BUN 56 (*)    Creatinine, Ser 7.85 (*)    Calcium 7.8 (*)    Albumin 3.4 (*)    GFR calc non Af Amer 6 (*)    GFR calc Af Amer 6 (*)    All other components within normal limits  URINALYSIS, ROUTINE W REFLEX MICROSCOPIC - Abnormal; Notable for the following components:   APPearance HAZY (*)    Glucose, UA 50 (*)    Hgb urine dipstick MODERATE (*)    Protein, ur 30 (*)    Leukocytes, UA SMALL (*)    Bacteria, UA RARE (*)    All other components within normal limits  I-STAT CHEM 8, ED - Abnormal; Notable for the following components:   BUN 53 (*)    Creatinine, Ser 7.50  (*)    Glucose, Bld 227 (*)    Calcium, Ion 0.97 (*)    Hemoglobin 10.2 (*)    HCT 30.0 (*)    All other components within normal limits  URINE CULTURE  RAPID URINE DRUG SCREEN, HOSP PERFORMED  I-STAT BETA HCG BLOOD, ED ([facility], [facility], [facility] ONLY)  I-STAT TROPONIN, ED    EKG EKG Interpretation  Date/Time:  Tuesday November 10 2017 09:33:00 EDT Ventricular Rate:  79 PR Interval:    QRS Duration: 87 QT Interval:  408 QTC Calculation: 468 R Axis:   91 Text Interpretation:  Sinus rhythm Borderline right axis deviation Low voltage, precordial leads similar to prior 10/17 Confirmed by Aletta Edouard [phone]) on 11/10/2017 9:41:26 AM   Radiology Dg Chest 2 View  Result Date: 11/10/2017 CLINICAL DATA:  Dyspnea EXAM: CHEST - 2 VIEW COMPARISON:  03/16/2016 chest radiograph. FINDINGS: Stable cardiomediastinal silhouette with top-normal heart size. No pneumothorax. No pleural effusion. Lungs appear clear, with no acute consolidative airspace disease and no pulmonary edema. IMPRESSION: No active cardiopulmonary disease. Electronically Signed   By: Ilona Sorrel M.D.   On: 11/10/2017 09:16   Ct Head Wo Contrast  Result Date: 11/10/2017 CLINICAL DATA:  Seizure during dialysis. EXAM: CT HEAD WITHOUT CONTRAST TECHNIQUE: Contiguous axial images were obtained from the base of the skull through the vertex without intravenous contrast. COMPARISON:  04/06/2012 FINDINGS: Brain: No evidence of acute infarction, hemorrhage, hydrocephalus, extra-axial collection or mass lesion/mass effect. Incidental note made of an empty sella. Vascular: No hyperdense vessel or unexpected calcification. Skull: No osseous abnormality. Sinuses/Orbits: Visualized paranasal sinuses are clear. Visualized mastoid sinuses are clear. Visualized orbits demonstrate no focal abnormality. Other: None IMPRESSION: No acute intracranial pathology. Electronically Signed   By: Kathreen Devoid   On: 11/10/2017 11:12    Procedures Procedures (including  critical care time)  Medications Ordered in ED Medications - No data to display   Initial Impression / Assessment and Plan / ED Course  I have reviewed the triage vital signs and the nursing  notes.  Pertinent labs & imaging results that were available during my care of the patient were reviewed by me and considered in my medical decision making (see chart for details).  Clinical Course as of Nov 11 1515  Tue Nov 11, 1655  2051 47 year old female   [MB]  2612 47 year old female with history of end-stage renal disease was at dialysis today just beginning around when she experienced to seizures.  She is a remote history of seizure disorder but has not had a seizure in 10 years and is not on any anticonvulsants.  Currently here now she feels back to baseline.  We are getting some screening labs to make sure she is not requiring urgent dialysis and then will review with  neurology.   [MB]    Clinical Course User Index [MB] Hayden Rasmussen, MD     pt with hx of seizures, last about 13 years ago, here with two seizures in dialysis.  She did not finish all of her treatment.  She is currently alert and oriented, has no complaints.  No infectious symptoms.  No signs of dehydration or fluid overload.  Vital signs are normal.  No new medications or drugs.  Will check electrolytes, blood counts, will get CT head, chest x-ray and monitor.  Patient's labs and imaging are unremarkable.  She continues to back at baseline, family at bedside.  I discussed patient with neurology on-call, who recommended loading her with Dilantin here and starting on Dilantin at home.  Patient agreed with the plan.  She received Dilantin in the ER, she will go home with prescription.  She will follow-up with her neurologist.  Return precautions discussed.  She will follow-up with her dialysis center tomorrow for dialysis.  Vitals:   11/10/17 1315 11/10/17 1328 11/10/17 1415 11/10/17 1433  BP: 120/70 120/70  111/72  Pulse: 83  86 73   Resp: 15 16 14    Temp:      TempSrc:      SpO2: 96% 98% 97%   Weight:      Height:         Final Clinical Impressions(s) / ED Diagnoses   Final diagnoses:  Seizure [facility])    ED Discharge Orders        Ordered    phenytoin (DILANTIN) 100 MG ER capsule  3 times daily     11/10/17 1352       Jeannett Senior, PA-C 11/10/17 1519    Hayden Rasmussen, MD 11/12/17 [phone]

## 2017-11-10 NOTE — ED Triage Notes (Signed)
Pt to ed via [facility] from a dialysis center- pt had a witnessed seizure- per staff-- grandmal type movement, was just beginning her treatment, but did not get any fluid taken off. Staff took her off machine, witnessed another seizure, sent here by ems.  Pt has graft in left upper arm accessed-  Pt short of breath with any exertion. Lungs diminished.

## 2017-11-11 LAB — URINE CULTURE: Culture: <10000 — AB

## 2017-11-24 ENCOUNTER — Encounter: Payer: Self-pay | Admitting: Neurology

## 2018-01-18 ENCOUNTER — Other Ambulatory Visit: Payer: Self-pay

## 2018-01-18 ENCOUNTER — Ambulatory Visit: Payer: Medicaid Other | Admitting: Neurology

## 2018-01-22 DIAGNOSIS — N189 Chronic kidney disease, unspecified: Secondary | ICD-10-CM | POA: Diagnosis not present

## 2018-01-22 DIAGNOSIS — R7989 Other specified abnormal findings of blood chemistry: Secondary | ICD-10-CM | POA: Diagnosis not present

## 2018-01-22 DIAGNOSIS — Z87891 Personal history of nicotine dependence: Secondary | ICD-10-CM

## 2018-01-22 DIAGNOSIS — D72829 Elevated white blood cell count, unspecified: Secondary | ICD-10-CM | POA: Diagnosis not present

## 2018-01-22 DIAGNOSIS — Z992 Dependence on renal dialysis: Secondary | ICD-10-CM | POA: Diagnosis not present

## 2018-01-22 DIAGNOSIS — D631 Anemia in chronic kidney disease: Secondary | ICD-10-CM

## 2018-02-07 NOTE — Care Management (Signed)
This is a no charge note  Transfer from [facility]  per Dr. Christin Fudge  47 year old lady with past medical history for ESRD-HD (TTS), hypertension, hyperlipidemia, diabetes mellitus, GERD, hypothyroidism, OSA, obesity, who presents to [facility] with chest pain.  Per EP physician, EKG has no dynamic change.  Currently chest pain-free.  Since no dialysis available in [place], patient needs to be transferred to [facility] hospital.  Patient had dialysis yesterday.  Potassium 4.0, creatinine 6.8.  Hemodynamically stable. Accepted to telemetry bed for observation.   Please call manager of [facility] at [phone] when pt arrives to floor   Ivor Costa, MD  [facility] Pager [phone]  If 7PM-7AM, please contact night-coverage [email or web address] Password [facility] 02/07/2018, 10:17 PM

## 2018-02-08 ENCOUNTER — Encounter (HOSPITAL_COMMUNITY): Payer: Self-pay | Admitting: *Deleted

## 2018-02-08 ENCOUNTER — Observation Stay (HOSPITAL_COMMUNITY)
Admission: AD | Admit: 2018-02-08 | Discharge: 2018-02-09 | Disposition: A | Discharge status: Home / Self Care | Payer: Medicaid Other | Source: Other Acute Inpatient Hospital | Attending: Internal Medicine | Admitting: Internal Medicine

## 2018-02-08 ENCOUNTER — Other Ambulatory Visit: Payer: Self-pay

## 2018-02-08 DIAGNOSIS — Z992 Dependence on renal dialysis: Secondary | ICD-10-CM

## 2018-02-08 DIAGNOSIS — H409 Unspecified glaucoma: Secondary | ICD-10-CM | POA: Insufficient documentation

## 2018-02-08 DIAGNOSIS — M1990 Unspecified osteoarthritis, unspecified site: Secondary | ICD-10-CM | POA: Diagnosis not present

## 2018-02-08 DIAGNOSIS — E1142 Type 2 diabetes mellitus with diabetic polyneuropathy: Secondary | ICD-10-CM | POA: Insufficient documentation

## 2018-02-08 DIAGNOSIS — Z955 Presence of coronary angioplasty implant and graft: Secondary | ICD-10-CM | POA: Insufficient documentation

## 2018-02-08 DIAGNOSIS — Z87891 Personal history of nicotine dependence: Secondary | ICD-10-CM | POA: Insufficient documentation

## 2018-02-08 DIAGNOSIS — Z9851 Tubal ligation status: Secondary | ICD-10-CM | POA: Insufficient documentation

## 2018-02-08 DIAGNOSIS — G4733 Obstructive sleep apnea (adult) (pediatric): Secondary | ICD-10-CM | POA: Diagnosis not present

## 2018-02-08 DIAGNOSIS — E1122 Type 2 diabetes mellitus with diabetic chronic kidney disease: Secondary | ICD-10-CM | POA: Insufficient documentation

## 2018-02-08 DIAGNOSIS — I10 Essential (primary) hypertension: Secondary | ICD-10-CM | POA: Diagnosis present

## 2018-02-08 DIAGNOSIS — Z79890 Hormone replacement therapy (postmenopausal): Secondary | ICD-10-CM | POA: Diagnosis not present

## 2018-02-08 DIAGNOSIS — Z885 Allergy status to narcotic agent status: Secondary | ICD-10-CM | POA: Diagnosis not present

## 2018-02-08 DIAGNOSIS — E669 Obesity, unspecified: Secondary | ICD-10-CM | POA: Diagnosis present

## 2018-02-08 DIAGNOSIS — I120 Hypertensive chronic kidney disease with stage 5 chronic kidney disease or end stage renal disease: Secondary | ICD-10-CM | POA: Diagnosis not present

## 2018-02-08 DIAGNOSIS — D631 Anemia in chronic kidney disease: Secondary | ICD-10-CM | POA: Diagnosis not present

## 2018-02-08 DIAGNOSIS — N186 End stage renal disease: Secondary | ICD-10-CM | POA: Insufficient documentation

## 2018-02-08 DIAGNOSIS — K219 Gastro-esophageal reflux disease without esophagitis: Secondary | ICD-10-CM | POA: Insufficient documentation

## 2018-02-08 DIAGNOSIS — J45909 Unspecified asthma, uncomplicated: Secondary | ICD-10-CM | POA: Insufficient documentation

## 2018-02-08 DIAGNOSIS — Z9889 Other specified postprocedural states: Secondary | ICD-10-CM | POA: Insufficient documentation

## 2018-02-08 DIAGNOSIS — Z6841 Body Mass Index (BMI) 40.0 and over, adult: Secondary | ICD-10-CM | POA: Insufficient documentation

## 2018-02-08 DIAGNOSIS — E785 Hyperlipidemia, unspecified: Secondary | ICD-10-CM | POA: Diagnosis not present

## 2018-02-08 DIAGNOSIS — Z23 Encounter for immunization: Secondary | ICD-10-CM | POA: Diagnosis not present

## 2018-02-08 DIAGNOSIS — Z8249 Family history of ischemic heart disease and other diseases of the circulatory system: Secondary | ICD-10-CM | POA: Insufficient documentation

## 2018-02-08 DIAGNOSIS — Z794 Long term (current) use of insulin: Secondary | ICD-10-CM | POA: Diagnosis not present

## 2018-02-08 DIAGNOSIS — Z7982 Long term (current) use of aspirin: Secondary | ICD-10-CM | POA: Insufficient documentation

## 2018-02-08 DIAGNOSIS — E039 Hypothyroidism, unspecified: Secondary | ICD-10-CM | POA: Diagnosis not present

## 2018-02-08 DIAGNOSIS — Z79899 Other long term (current) drug therapy: Secondary | ICD-10-CM | POA: Insufficient documentation

## 2018-02-08 DIAGNOSIS — Z841 Family history of disorders of kidney and ureter: Secondary | ICD-10-CM | POA: Insufficient documentation

## 2018-02-08 DIAGNOSIS — Z833 Family history of diabetes mellitus: Secondary | ICD-10-CM | POA: Insufficient documentation

## 2018-02-08 DIAGNOSIS — R079 Chest pain, unspecified: Secondary | ICD-10-CM | POA: Diagnosis present

## 2018-02-08 DIAGNOSIS — R072 Precordial pain: Principal | ICD-10-CM | POA: Diagnosis present

## 2018-02-08 DIAGNOSIS — E1169 Type 2 diabetes mellitus with other specified complication: Secondary | ICD-10-CM | POA: Diagnosis not present

## 2018-02-08 LAB — BASIC METABOLIC PANEL
ANION GAP: 14 (ref 5–15)
BUN: 50 mg/dL — ABNORMAL HIGH (ref 6–20)
CALCIUM: 8.3 mg/dL — AB (ref 8.9–10.3)
CO2: 25 mmol/L (ref 22–32)
CREATININE: 8.05 mg/dL — AB (ref 0.44–1.00)
Chloride: 102 mmol/L (ref 98–111)
GFR calc Af Amer: 6 mL/min — ABNORMAL LOW (ref >60)
GFR calc non Af Amer: 5 mL/min — ABNORMAL LOW (ref >60)
Glucose, Bld: 224 mg/dL — ABNORMAL HIGH (ref 70–99)
Potassium: 4.6 mmol/L (ref 3.5–5.1)
SODIUM: 141 mmol/L (ref 135–145)

## 2018-02-08 LAB — CBC WITH DIFFERENTIAL/PLATELET
Abs Immature Granulocytes: 0.1 10*3/uL (ref 0.0–0.1)
Basophils Absolute: 0.1 10*3/uL (ref 0.0–0.1)
Basophils Relative: 0 %
EOS ABS: 0.2 10*3/uL (ref 0.0–0.7)
Eosinophils Relative: 2 %
HEMATOCRIT: 34.3 % — AB (ref 36.0–46.0)
Hemoglobin: 10.9 g/dL — ABNORMAL LOW (ref 12.0–15.0)
IMMATURE GRANULOCYTES: 1 %
LYMPHS ABS: 2.5 10*3/uL (ref 0.7–4.0)
Lymphocytes Relative: 21 %
MCH: 28.5 pg (ref 26.0–34.0)
MCHC: 31.8 g/dL (ref 30.0–36.0)
MCV: 89.6 fL (ref 78.0–100.0)
Monocytes Absolute: 0.7 10*3/uL (ref 0.1–1.0)
Monocytes Relative: 6 %
NEUTROS PCT: 70 %
Neutro Abs: 8.6 10*3/uL — ABNORMAL HIGH (ref 1.7–7.7)
Platelets: 272 10*3/uL (ref 150–400)
RBC: 3.83 MIL/uL — AB (ref 3.87–5.11)
RDW: 15 % (ref 11.5–15.5)
WBC: 12.2 10*3/uL — AB (ref 4.0–10.5)

## 2018-02-08 LAB — TROPONIN I: Troponin I: <0.03 ng/mL (ref <0.03)

## 2018-02-08 LAB — GLUCOSE, CAPILLARY
GLUCOSE-CAPILLARY: 228 mg/dL — AB (ref 70–99)
Glucose-Capillary: 379 mg/dL — ABNORMAL HIGH (ref 70–99)
Glucose-Capillary: 171 mg/dL — ABNORMAL HIGH (ref 70–99)

## 2018-02-08 LAB — TSH: TSH: 2.143 u[IU]/mL (ref 0.350–4.500)

## 2018-02-08 MED ORDER — INFLUENZA VAC SPLIT QUAD 0.5 ML IM SUSY
0.5000 mL | PREFILLED_SYRINGE | INTRAMUSCULAR | Status: AC
  Administered 2018-02-09: 0.5 mL via INTRAMUSCULAR
  Filled 2018-02-08: qty 0.5

## 2018-02-08 MED ORDER — ONDANSETRON HCL 4 MG PO TABS: 4.0000 mg | ORAL_TABLET | Freq: Four times a day (QID) | ORAL | Status: DC | PRN

## 2018-02-08 MED ORDER — MORPHINE SULFATE (PF) 2 MG/ML IV SOLN: 2.0000 mg | INTRAVENOUS | Status: DC | PRN

## 2018-02-08 MED ORDER — ACETAMINOPHEN 650 MG RE SUPP: 650.0000 mg | Freq: Four times a day (QID) | RECTAL | Status: DC | PRN

## 2018-02-08 MED ORDER — CALCIUM ACETATE (PHOS BINDER) 667 MG PO CAPS
1334.0000 mg | ORAL_CAPSULE | Freq: Three times a day (TID) | ORAL | Status: DC
  Administered 2018-02-08 – 2018-02-09 (×2): 1334 mg via ORAL
  Filled 2018-02-08 (×2): qty 2

## 2018-02-08 MED ORDER — CALCIUM CARBONATE ANTACID 1250 MG/5ML PO SUSP
500.0000 mg | Freq: Four times a day (QID) | ORAL | Status: DC | PRN
  Filled 2018-02-08: qty 5

## 2018-02-08 MED ORDER — ALBUTEROL SULFATE (2.5 MG/3ML) 0.083% IN NEBU: 3.0000 mL | INHALATION_SOLUTION | Freq: Four times a day (QID) | RESPIRATORY_TRACT | Status: DC | PRN

## 2018-02-08 MED ORDER — METOPROLOL SUCCINATE ER 50 MG PO TB24
50.0000 mg | ORAL_TABLET | Freq: Two times a day (BID) | ORAL | Status: DC
  Administered 2018-02-09 (×2): 50 mg via ORAL
  Filled 2018-02-08 (×2): qty 1

## 2018-02-08 MED ORDER — LORATADINE 10 MG PO TABS
10.0000 mg | ORAL_TABLET | Freq: Every day | ORAL | Status: DC
  Administered 2018-02-08 – 2018-02-09 (×2): 10 mg via ORAL
  Filled 2018-02-08 (×2): qty 1

## 2018-02-08 MED ORDER — FERRIC CITRATE 1 GM 210 MG(FE) PO TABS: 210.0000 mg | ORAL_TABLET | Freq: Three times a day (TID) | ORAL | Status: DC

## 2018-02-08 MED ORDER — ZOLPIDEM TARTRATE 5 MG PO TABS: 5.0000 mg | ORAL_TABLET | Freq: Every evening | ORAL | Status: DC | PRN

## 2018-02-08 MED ORDER — CINACALCET HCL 30 MG PO TABS: 90.0000 mg | ORAL_TABLET | Freq: Every day | ORAL | Status: DC

## 2018-02-08 MED ORDER — DOCUSATE SODIUM 283 MG RE ENEM
1.0000 | ENEMA | RECTAL | Status: DC | PRN
  Filled 2018-02-08: qty 1

## 2018-02-08 MED ORDER — INSULIN ASPART 100 UNIT/ML ~~LOC~~ SOLN
0.0000 [IU] | Freq: Three times a day (TID) | SUBCUTANEOUS | Status: DC
  Administered 2018-02-08: 15 [IU] via SUBCUTANEOUS
  Administered 2018-02-08: 5 [IU] via SUBCUTANEOUS
  Administered 2018-02-09 (×2): 3 [IU] via SUBCUTANEOUS

## 2018-02-08 MED ORDER — TIOTROPIUM BROMIDE MONOHYDRATE 18 MCG IN CAPS
18.0000 ug | ORAL_CAPSULE | Freq: Every day | RESPIRATORY_TRACT | Status: DC
  Administered 2018-02-09: 18 ug via RESPIRATORY_TRACT
  Filled 2018-02-08 (×2): qty 5

## 2018-02-08 MED ORDER — ONDANSETRON HCL 4 MG/2ML IJ SOLN
4.0000 mg | Freq: Four times a day (QID) | INTRAMUSCULAR | Status: DC | PRN
  Administered 2018-02-08 (×2): 4 mg via INTRAVENOUS
  Filled 2018-02-08 (×2): qty 2

## 2018-02-08 MED ORDER — INSULIN LISPRO 100 UNIT/ML ~~LOC~~ SOLN: 5.0000 [IU] | SUBCUTANEOUS | Status: DC

## 2018-02-08 MED ORDER — PREGABALIN 75 MG PO CAPS
75.0000 mg | ORAL_CAPSULE | Freq: Two times a day (BID) | ORAL | Status: DC
  Administered 2018-02-09 (×2): 75 mg via ORAL
  Filled 2018-02-08 (×2): qty 1

## 2018-02-08 MED ORDER — HYDROXYZINE HCL 25 MG PO TABS: 25.0000 mg | ORAL_TABLET | Freq: Three times a day (TID) | ORAL | Status: DC | PRN

## 2018-02-08 MED ORDER — SORBITOL 70 % SOLN: 30.0000 mL | Status: DC | PRN

## 2018-02-08 MED ORDER — ASPIRIN EC 81 MG PO TBEC
81.0000 mg | DELAYED_RELEASE_TABLET | Freq: Every day | ORAL | Status: DC
  Administered 2018-02-08 – 2018-02-09 (×2): 81 mg via ORAL
  Filled 2018-02-08 (×2): qty 1

## 2018-02-08 MED ORDER — OXYCODONE HCL 5 MG PO TABS
10.0000 mg | ORAL_TABLET | Freq: Four times a day (QID) | ORAL | Status: DC | PRN
  Administered 2018-02-08: 10 mg via ORAL
  Filled 2018-02-08 (×2): qty 2

## 2018-02-08 MED ORDER — GI COCKTAIL ~~LOC~~: 30.0000 mL | Freq: Four times a day (QID) | ORAL | Status: DC | PRN

## 2018-02-08 MED ORDER — LATANOPROST 0.005 % OP SOLN: 1.0000 [drp] | Freq: Every day | OPHTHALMIC | Status: DC

## 2018-02-08 MED ORDER — LEVOTHYROXINE SODIUM 25 MCG PO TABS
125.0000 ug | ORAL_TABLET | Freq: Every day | ORAL | Status: DC
  Administered 2018-02-09: 125 ug via ORAL
  Filled 2018-02-08: qty 1

## 2018-02-08 MED ORDER — NETARSUDIL DIMESYLATE 0.02 % OP SOLN: 1.0000 [drp] | Freq: Every day | OPHTHALMIC | Status: DC

## 2018-02-08 MED ORDER — NEPRO/CARBSTEADY PO LIQD
237.0000 mL | Freq: Three times a day (TID) | ORAL | Status: DC | PRN
  Filled 2018-02-08: qty 237

## 2018-02-08 MED ORDER — PROMETHAZINE HCL 25 MG/ML IJ SOLN
12.5000 mg | Freq: Once | INTRAMUSCULAR | Status: DC
  Filled 2018-02-08: qty 1

## 2018-02-08 MED ORDER — CAMPHOR-MENTHOL 0.5-0.5 % EX LOTN
1.0000 "application " | TOPICAL_LOTION | Freq: Three times a day (TID) | CUTANEOUS | Status: DC | PRN
  Filled 2018-02-08: qty 222

## 2018-02-08 MED ORDER — ENOXAPARIN SODIUM 30 MG/0.3ML ~~LOC~~ SOLN
30.0000 mg | SUBCUTANEOUS | Status: DC
  Administered 2018-02-08: 30 mg via SUBCUTANEOUS
  Filled 2018-02-08: qty 0.3

## 2018-02-08 MED ORDER — INSULIN ASPART 100 UNIT/ML ~~LOC~~ SOLN: 0.0000 [IU] | Freq: Every day | SUBCUTANEOUS | Status: DC

## 2018-02-08 MED ORDER — SIMVASTATIN 20 MG PO TABS
20.0000 mg | ORAL_TABLET | Freq: Every day | ORAL | Status: DC
  Administered 2018-02-08 – 2018-02-09 (×2): 20 mg via ORAL
  Filled 2018-02-08 (×2): qty 1

## 2018-02-08 MED ORDER — PANTOPRAZOLE SODIUM 40 MG PO TBEC
40.0000 mg | DELAYED_RELEASE_TABLET | Freq: Every day | ORAL | Status: DC
  Administered 2018-02-08 – 2018-02-09 (×2): 40 mg via ORAL
  Filled 2018-02-08 (×2): qty 1

## 2018-02-08 MED ORDER — PHENYTOIN SODIUM EXTENDED 100 MG PO CAPS
100.0000 mg | ORAL_CAPSULE | Freq: Three times a day (TID) | ORAL | Status: DC
  Administered 2018-02-08 – 2018-02-09 (×3): 100 mg via ORAL
  Filled 2018-02-08 (×4): qty 1

## 2018-02-08 MED ORDER — ACETAMINOPHEN 325 MG PO TABS
650.0000 mg | ORAL_TABLET | Freq: Four times a day (QID) | ORAL | Status: DC | PRN
  Administered 2018-02-09: 650 mg via ORAL
  Filled 2018-02-08: qty 2

## 2018-02-08 MED ORDER — AMLODIPINE BESYLATE 10 MG PO TABS
10.0000 mg | ORAL_TABLET | Freq: Every day | ORAL | Status: DC
  Administered 2018-02-09: 10 mg via ORAL
  Filled 2018-02-08: qty 1

## 2018-02-08 NOTE — H&P (Signed)
History and Physical    Wanda Ramirez:[id number] DOB: May 02, 1971 DOA: 02/08/2018  PCP: Welford Roche, NP Consultants:  Ronnald Ramp - diabetes specialist; Venetia Maxon - eye Patient coming from:  Home - lives with granddaughter; NOK: Oldest daughter, [phone]  Chief Complaint: chest pain  HPI: Wanda Ramirez is a 47 y.o. female with medical history significant of ESRD-HD (TTS), hypertension, hyperlipidemia, diabetes mellitus, GERD, hypothyroidism, OSA, obesity, who presented to [facility] with chest pain.  Yesterday, she was having chest pain and tightness in her chest.  She went to [place] and everything came back negative but they wanted her to come back here because she is also due for HD today.  Chest pain has resolved, but she did have some early this AM.  Non-exertional, was driving.  It resolved spontaneously and was intermittent in nature.  She thinks it might be gas.  Epigastric/substernal pain.  She says she will get dialyzed today and Wednesday because she is going to the beach Wednesday for a day.   ED Course:  Per Dr. Blaine Hamper: Per EP physician, EKG has no dynamic change.  Currently chest pain-free.  Since no dialysis available in [place], patient needs to be transferred to [facility] hospital.  Patient had dialysis yesterday.  Potassium 4.0, creatinine 6.8.  Hemodynamically stable. Accepted to telemetry bed for observation.   Review of Systems: As per HPI; otherwise review of systems reviewed and negative.   Ambulatory Status:  Ambulates without assistance  Past Medical History:  Diagnosis Date  . Arthritis   . Asthma   . Chronic back pain   . Diabetes mellitus ([place])   . ESRD (end stage renal disease) on dialysis ([facility])    TTS  . Essential hypertension   . GERD (gastroesophageal reflux disease)   . Glaucoma   . Hyperlipidemia   . Hypothyroidism   . Morbid obesity ([place])   . Peripheral neuropathy   . Sleep apnea    wears CPAP, does not know setting    Past Surgical  History:  Procedure Laterality Date  . AV FISTULA PLACEMENT Left 04/2014  . CARDIAC CATHETERIZATION N/A 03/17/2016   Procedure: Left Heart Cath and Coronary Angiography;  Surgeon: [name], MD;  Location: [facility];  Service: Cardiovascular;  Laterality: N/A;  . KNEE SURGERY    . TUBAL LIGATION      Social History   Socioeconomic History  . Marital status: Single    Spouse name: Not on file  . Number of children: Not on file  . Years of education: Not on file  . Highest education level: Not on file  Occupational History  . Occupation: disabled  Social Needs  . Financial resource strain: Not hard at all  . Food insecurity:    Worry: Never true    Inability: Never true  . Transportation needs:    Medical: Patient refused    Non-medical: Patient refused  Tobacco Use  . Smoking status: Former Research scientist (life sciences)  . Smokeless tobacco: Never Used  . Tobacco comment: Smoked x 2 yrs  Substance and Sexual Activity  . Alcohol use: No    Comment: years ago - has since quit  . Drug use: No  . Sexual activity: Not on file  Lifestyle  . Physical activity:    Days per week: 0 days    Minutes per session: 0 min  . Stress: Not at all  Relationships  . Social connections:    Talks on phone: Twice a week  Gets together: Twice a week    Attends religious service: 1 to 4 times per year    Active member of club or organization: No    Attends meetings of clubs or organizations: Never    Relationship status: Patient refused  . Intimate partner violence:    Fear of current or ex partner: No    Emotionally abused: No    Physically abused: No    Forced sexual activity: No  Other Topics Concern  . Not on file  Social History Narrative  . Not on file    Allergies  Allergen Reactions  . Hydrocodone Itching and Nausea And Vomiting    Family History  Problem Relation Age of Onset  . Diabetes Mother   . Hyperlipidemia Mother   . Kidney disease Father     Prior to  Admission medications   Medication Sig Start Date End Date Taking? Authorizing Provider  acetaminophen (TYLENOL) 325 MG tablet Take 650 mg by mouth every 6 (six) hours as needed for mild pain.    [provider]  amLODipine (NORVASC) 10 MG tablet Take 10 mg by mouth at bedtime.    [provider]  aspirin EC 81 MG tablet Take 81 mg by mouth daily.    [provider]  AURYXIA 1 GM 210 MG(Fe) tablet TAKE 1 TABLET BY MOUTH THREE TIMES A DAY WITH MEALS.   SWALLOW WHOLE, DO NOT CHEW OR CRUSH MEDICATION 12/18/17   [provider]  benzonatate (TESSALON) 100 MG capsule Take 100 mg by mouth daily.    [provider]  Brinzolamide-Brimonidine (SIMBRINZA) 1-0.2 % SUSP Place 2 drops into both eyes daily.    [provider]  calcium acetate (PHOSLO) 667 MG capsule Take 1,334 mg by mouth 3 (three) times daily. 10/26/17   [provider]  cinacalcet (SENSIPAR) 90 MG tablet Take 90 mg by mouth daily.     [provider]  insulin lispro (HUMALOG) 100 UNIT/ML injection Inject 0-10 Units into the skin 2 (two) times daily before a meal.     [provider]  levothyroxine (SYNTHROID, LEVOTHROID) 125 MCG tablet Take 125 mcg by mouth daily before breakfast.    [provider]  loratadine (CLARITIN) 10 MG tablet Take 10 mg by mouth daily.    [provider]  metoprolol succinate (TOPROL-XL) 50 MG 24 hr tablet Take 100 mg by mouth 2 (two) times daily. Take with or immediately following a meal.     [provider]  omeprazole (PRILOSEC) 40 MG capsule Take 40 mg by mouth daily.    [provider]  ondansetron (ZOFRAN) 4 MG tablet Take 4 mg by mouth daily.    [provider]  Oxycodone HCl 10 MG TABS Take 10 mg by mouth 4 (four) times daily as needed for pain. 01/01/16   [provider]  phenytoin (DILANTIN) 100 MG ER capsule Take 1 capsule (100 mg total) by mouth 3 (three) times daily. 11/10/17    Kirichenko, Tatyana, PA-C  pregabalin (LYRICA) 75 MG capsule Take 75 mg by mouth 2 (two) times daily.     [provider]  simvastatin (ZOCOR) 20 MG tablet Take 20 mg by mouth daily.     [provider]  tiotropium (SPIRIVA) 18 MCG inhalation capsule Place 18 mcg into inhaler and inhale daily.    [provider]    Physical Exam: Vitals:   02/08/18 0928 02/08/18 1315 02/08/18 1613  BP: (!) 120/58 101/67 106/61  Pulse: 73 72 71  Resp: 16 18 18   Temp: 98.2 F (36.8 C) 97.7 F (36.5 C) 97.6 F (36.4 C)  TempSrc: Oral Oral Oral  SpO2: 99% 100% 98%  Weight: 126.8 kg    Height: 5\' 2"  (1.575 m)       General:  Appears calm and comfortable and is NAD; she appears to be quite sedentary Eyes:  EOMI, normal lids, iris ENT:  grossly normal hearing, lips & tongue, mmm Neck:  no LAD, masses or thyromegaly Cardiovascular:  RRR, no m/r/g. No LE edema.  Respiratory:   CTA bilaterally with no wheezes/rales/rhonchi.  Normal respiratory effort. Abdomen:  soft, NT, ND, NABS Back:   normal alignment, no CVAT Skin:  no rash or induration seen on limited exam Musculoskeletal:  grossly normal tone BUE/BLE, good ROM, no bony abnormality Psychiatric:  grossly normal mood and affect, speech fluent and appropriate, AOx3 Neurologic:  CN 2-12 grossly intact, moves all extremities in coordinated fashion, sensation intact    Radiological Exams on Admission: No results found.  EKG: Independently reviewed.  NSR with rate 69; nonspecific ST changes with no evidence of acute ischemia   Labs on Admission: I have personally reviewed the available labs and imaging studies at the time of the admission.  Pertinent labs:   Glucose 224 BUN 50/Creatinine 8.05/GFR 6 Troponin <0.03 WBC 12.2 Hgb 10.9 TSH 2.143  Assessment/Plan Principal Problem:   Precordial chest pain Active Problems:   ESRD on dialysis ([place])   Diabetes mellitus type 2 in obese [facility])   Essential  hypertension   Hypothyroidism   Hyperlipidemia   Chest pain  -Patient with substernal chest pressure that has come on intermittently since yesterday, appears to resolve spontaneously. -1/3 typical symptoms suggestive of noncardiac chest pain.  -CXR unremarkable.   -Initial cardiac troponin negative.  -EKG not indicative of acute ischemia.   -GRACE score is 105; which predicts an in-hospital death rate of 1%.  -Will plan to place in observation status on telemetry to rule out ACS by overnight observation.  -cycle troponin q6h x 3 and repeat EKG in AM -Continue ASA 81 mg daily -Risk factor stratification with HgbA1c and FLP; will also check TSH -Cardiology consultation in AM - NPO for possible stress test  -Will plan to start Heparin drip if enzymes are positive and/or chest pain recurs  ESRD on HD with anemia -Patient on chronic TTS HD -Nephrology prn order set utilized -She does not appear to be volume overloaded or otherwise in need of acute HD -Patient discussed with Dr. Jimmy Footman - if she is appropriate for d/c tomorrow, she can proceed with outpatient HD tomorrow with plan for repeat session on Wednesday before she goes to the beach.  HTN -Takes Norvasc and Toprol at home -Patient with good control at this time  HLD -Continue Zocor -Check lipids in the AM  DM -Glucose poorly controlled -Check A1c -She does not appear to be using basal insulin, which likely needs to be added -Will cover with moderate-scale SSI for now  Hypothyroidism -Check TSH -Continue Synthroid at current dose for now  OSA -Continue CPAP -Patient does not know how setting so will use autopap/RT assistance   DVT prophylaxis:  Lovenox  Code Status:  Full - confirmed with patient Family Communication: None present  Disposition Plan:  Home once clinically improved Consults called: Cardiology (cardsmaster message); Nephrology (telephone only)  Admission status: It is my clinical opinion that  referral for OBSERVATION is reasonable and necessary in this patient  based on the above information provided. The aforementioned taken together are felt to place the patient at high risk for further clinical deterioration. However it is anticipated that the patient may be medically stable for discharge from the hospital within 24 to 48 hours.    Karmen Bongo MD [facility]  If note is complete, please contact covering daytime or nighttime physician. [email or web address] Password [email or web address]  02/08/2018, 4:20 PM

## 2018-02-08 NOTE — Progress Notes (Signed)
Inpatient Diabetes Program Recommendations  AACE/ADA: New Consensus Statement on Inpatient Glycemic Control (2019)  Target Ranges:  Prepandial:   less than 140 mg/dL      Peak postprandial:   less than 180 mg/dL (1-2 hours)      Critically ill patients:  140 - 180 mg/dL   Results for Wanda Ramirez, Wanda Ramirez (MRN [id number]) as of 02/08/2018 14:23  Ref. Range 02/08/2018 13:14  Glucose-Capillary Latest Ref Range: 70 - 99 mg/dL 379 (H)  Results for Wanda Ramirez, Wanda Ramirez (MRN [id number]) as of 02/08/2018 14:23  Ref. Range 02/08/2018 11:42  Glucose Latest Ref Range: 70 - 99 mg/dL 224 (H)   Review of Glycemic Control  Diabetes history: DM2; ESRD w/HD Outpatient Diabetes medications: Humulin R U500  Current orders for Inpatient glycemic control: Novolog 0-15 units TID with meals, Novolog 0-5 units QHS  Inpatient Diabetes Program Recommendations: Insulin - Basal: Please consider ordering at least Lantus 19 units Q24H (basedon 126.8 kg x 0.15 units). HgbA1C: Current A1C in process.  Per chart, last A1C was 9% on 01/15/18.  NOTE: In reviewing chart, noted patient sees Radene Gunning, PA ([place]; Endocrinology) for DM management. Per office note by Ronnald Ramp, PA on 01/15/18 patient should be taking HUMULIN R U500 insulin as an outpatient; prescribed: Humulin R U500- Inject 6 units at 8am, 3 units at 2pm and 10 units at 10pm.    Thanks, Barnie Alderman, RN, MSN, CDE Diabetes Coordinator Inpatient Diabetes Program [phone] (Team Pager from 8am to 5pm)

## 2018-02-08 NOTE — Progress Notes (Signed)
[facility] to fax results of repeat troponin.

## 2018-02-09 ENCOUNTER — Other Ambulatory Visit: Payer: Self-pay

## 2018-02-09 DIAGNOSIS — R072 Precordial pain: Secondary | ICD-10-CM

## 2018-02-09 DIAGNOSIS — E1169 Type 2 diabetes mellitus with other specified complication: Secondary | ICD-10-CM | POA: Diagnosis not present

## 2018-02-09 DIAGNOSIS — E039 Hypothyroidism, unspecified: Secondary | ICD-10-CM

## 2018-02-09 DIAGNOSIS — I10 Essential (primary) hypertension: Secondary | ICD-10-CM | POA: Diagnosis not present

## 2018-02-09 DIAGNOSIS — N186 End stage renal disease: Secondary | ICD-10-CM

## 2018-02-09 DIAGNOSIS — Z992 Dependence on renal dialysis: Secondary | ICD-10-CM

## 2018-02-09 DIAGNOSIS — I120 Hypertensive chronic kidney disease with stage 5 chronic kidney disease or end stage renal disease: Secondary | ICD-10-CM | POA: Diagnosis not present

## 2018-02-09 DIAGNOSIS — E7849 Other hyperlipidemia: Secondary | ICD-10-CM

## 2018-02-09 DIAGNOSIS — E669 Obesity, unspecified: Secondary | ICD-10-CM

## 2018-02-09 DIAGNOSIS — E1122 Type 2 diabetes mellitus with diabetic chronic kidney disease: Secondary | ICD-10-CM | POA: Diagnosis not present

## 2018-02-09 LAB — LIPID PANEL
Cholesterol: 192 mg/dL (ref 0–200)
HDL: 30 mg/dL — ABNORMAL LOW (ref >40)
LDL CALC: 87 mg/dL (ref 0–99)
Total CHOL/HDL Ratio: 6.4 RATIO
Triglycerides: 377 mg/dL — ABNORMAL HIGH (ref <150)
VLDL: 75 mg/dL — ABNORMAL HIGH (ref 0–40)

## 2018-02-09 LAB — HEMOGLOBIN A1C
HEMOGLOBIN A1C: 9.5 % — AB (ref 4.8–5.6)
MEAN PLASMA GLUCOSE: 226 mg/dL

## 2018-02-09 LAB — GLUCOSE, CAPILLARY
GLUCOSE-CAPILLARY: 180 mg/dL — AB (ref 70–99)
Glucose-Capillary: 199 mg/dL — ABNORMAL HIGH (ref 70–99)

## 2018-02-09 LAB — TROPONIN I

## 2018-02-09 LAB — HIV ANTIBODY (ROUTINE TESTING W REFLEX): HIV SCREEN 4TH GENERATION: NONREACTIVE

## 2018-02-09 MED ORDER — PREGABALIN 75 MG PO CAPS: 75.0000 mg | ORAL_CAPSULE | Freq: Every day | ORAL | Status: DC

## 2018-02-09 NOTE — Consult Note (Signed)
Cardiology Consultation:   Patient ID: Wanda Ramirez MRN: [id number]; DOB: Oct 11, 1970  Admit date: 02/08/2018 Date of Consult: 02/09/2018  Primary Care Provider: Welford Roche, NP Primary Cardiologist: Dr. Angelena Form (will be followed at [facility])  Patient Profile:   Wanda Ramirez is a 47 y.o. female with a hx of morbidly obese AAF with history of ESRD on HD TTS, HTN, HLD, diabetes mellitus c/b peripheral neuropathy, asthma, chronic back pain, arthritis, OSA, hypothyroidism, and glaucoma who is being seen today for the evaluation of Chest pain at the request of Dr. Algis Liming.   Seen by Dr. Angelena Form 03/2016 when patient admitted for chest pain. Cardiac cath 03/17/16 with no evidence of CAD. No follow up since then.  History of Present Illness:   Wanda Ramirez transfered from [facility] 9/19 for chest pain and dialysis.  Patient has intermittent chest pain for past 1 week.  She describes her pain as a sharp left-sided lasting for about 2 minutes each time.  No associated symptoms.  No radiation.  Not exertional related.  Patient does her dialysis Tuesday, Thursday and Saturday at [place].  Last dialysis on Saturday 9/7.  Sunday 9/8 patient had a recurrent chest pain leading to further evaluation at [facility] on 9/9.  Since no dialysis available in [place] she was transferred to [facility].  No recurrent chest pain since ER.  Patient is due for dialysis today.  She is planning to go [facility] tomorrow and come back on a Friday.  Patient has limited activity.  She is compliant with medication.  Denies history of tobacco abuse or illicit drug use.  Troponin I < 0.03 x3. Creatinine 8.05. Hgb A1c 9.5. Normal TSH.   Past Medical History:  Diagnosis Date  . Arthritis   . Asthma   . Chronic back pain   . Diabetes mellitus ([place])   . ESRD (end stage renal disease) on dialysis (HCC)    TTS  . Essential hypertension   . GERD (gastroesophageal reflux disease)   . Glaucoma   .  Hyperlipidemia   . Hypothyroidism   . Morbid obesity ([place])   . Peripheral neuropathy   . Sleep apnea    wears CPAP, does not know setting    Past Surgical History:  Procedure Laterality Date  . AV FISTULA PLACEMENT Left 04/2014  . CARDIAC CATHETERIZATION N/A 03/17/2016   Procedure: Left Heart Cath and Coronary Angiography;  Surgeon: [name], MD;  Location: [place] CV LAB;  Service: Cardiovascular;  Laterality: N/A;  . KNEE SURGERY    . TUBAL LIGATION      Inpatient Medications: Scheduled Meds: . amLODipine  10 mg Oral QHS  . aspirin EC  81 mg Oral Daily  . calcium acetate  1,334 mg Oral TID WC  . cinacalcet  90 mg Oral Q breakfast  . enoxaparin (LOVENOX) injection  30 mg Subcutaneous Q24H  . Influenza vac split quadrivalent PF  0.5 mL Intramuscular Tomorrow-1000  . insulin aspart  0-15 Units Subcutaneous TID WC  . insulin aspart  0-5 Units Subcutaneous QHS  . levothyroxine  125 mcg Oral QAC breakfast  . loratadine  10 mg Oral Daily  . metoprolol succinate  50 mg Oral BID  . pantoprazole  40 mg Oral Daily  . phenytoin  100 mg Oral TID  . pregabalin  75 mg Oral BID  . promethazine  12.5 mg Intravenous Once  . simvastatin  20 mg Oral Daily  . tiotropium  18 mcg Inhalation Daily  Continuous Infusions:  PRN Meds: acetaminophen **OR** acetaminophen, albuterol, calcium carbonate (dosed in mg elemental calcium), camphor-menthol **AND** hydrOXYzine, docusate sodium, feeding supplement (NEPRO CARB STEADY), gi cocktail, ondansetron **OR** ondansetron (ZOFRAN) IV, oxyCODONE, sorbitol, zolpidem  Allergies:    Allergies  Allergen Reactions  . Hydrocodone Itching and Nausea And Vomiting    Social History:   Social History   Socioeconomic History  . Marital status: Single    Spouse name: Not on file  . Number of children: Not on file  . Years of education: Not on file  . Highest education level: Not on file  Occupational History  . Occupation: disabled    Social Needs  . Financial resource strain: Not hard at all  . Food insecurity:    Worry: Never true    Inability: Never true  . Transportation needs:    Medical: Patient refused    Non-medical: Patient refused  Tobacco Use  . Smoking status: Former Research scientist (life sciences)  . Smokeless tobacco: Never Used  . Tobacco comment: Smoked x 2 yrs  Substance and Sexual Activity  . Alcohol use: No    Comment: years ago - has since quit  . Drug use: No  . Sexual activity: Not on file  Lifestyle  . Physical activity:    Days per week: 0 days    Minutes per session: 0 min  . Stress: Not at all  Relationships  . Social connections:    Talks on phone: Twice a week    Gets together: Twice a week    Attends religious service: 1 to 4 times per year    Active member of club or organization: No    Attends meetings of clubs or organizations: Never    Relationship status: Patient refused  . Intimate partner violence:    Fear of current or ex partner: No    Emotionally abused: No    Physically abused: No    Forced sexual activity: No  Other Topics Concern  . Not on file  Social History Narrative  . Not on file    Family History:   Family History  Problem Relation Age of Onset  . Diabetes Mother   . Hyperlipidemia Mother   . Kidney disease Father      ROS:  Please see the history of present illness.  All other ROS reviewed and negative.     Physical Exam/Data:   Vitals:   02/08/18 2136 02/09/18 0001 02/09/18 0453 02/09/18 0823  BP: (!) 144/93 121/66 119/69 102/61  Pulse: 92 88 73 74  Resp:  20 20   Temp:  99.6 F (37.6 C) 98.2 F (36.8 C) 98.4 F (36.9 C)  TempSrc:  Oral Oral Oral  SpO2:  97% 92% 97%  Weight:   127.1 kg   Height:        Intake/Output Summary (Last 24 hours) at 02/09/2018 0920 Last data filed at 02/09/2018 0552 Gross per 24 hour  Intake 720 ml  Output 400 ml  Net 320 ml   Filed Weights   02/08/18 0928 02/09/18 0453  Weight: 126.8 kg 127.1 kg   Body mass index is  51.25 kg/m.  General: Obese female in no acute distress HEENT: normal Lymph: no adenopathy Neck: no JVD Endocrine:  No thryomegaly Vascular: No carotid bruits; FA pulses 2+ bilaterally without bruits  Cardiac:  normal S1, S2; RRR; no murmur  Lungs:  clear to auscultation bilaterally, no wheezing, rhonchi or rales  Abd: soft, nontender, no hepatomegaly  Ext: no  edema Musculoskeletal:  No deformities, BUE and BLE strength normal and equal Skin: warm and dry  Neuro:  CNs 2-12 intact, no focal abnormalities noted Psych:  Normal affect   EKG:  The EKG was personally reviewed and demonstrates:  NSR without acute abnormality Telemetry:  Telemetry was personally reviewed and demonstrates:  NSR without arrhythmias   Relevant CV Studies:  Left Heart Cath and Coronary Angiography  03/2016  Conclusion     The left ventricular systolic function is normal.  LV end diastolic pressure is normal.  The left ventricular ejection fraction is 55-65% by visual estimate.  There is no mitral valve regurgitation.   1. No angiographic evidence of CAD 2. Normal LV systolic function 3. Non-cardiac chest pain  Recommendations: No further ischemic workup   Laboratory Data:  Chemistry Recent Labs  Lab 02/08/18 1142  NA 141  K 4.6  CL 102  CO2 25  GLUCOSE 224*  BUN 50*  CREATININE 8.05*  CALCIUM 8.3*  GFRNONAA 5*  GFRAA 6*  ANIONGAP 14    Hematology Recent Labs  Lab 02/08/18 1142  WBC 12.2*  RBC 3.83*  HGB 10.9*  HCT 34.3*  MCV 89.6  MCH 28.5  MCHC 31.8  RDW 15.0  PLT 272   Cardiac Enzymes Recent Labs  Lab 02/08/18 1142 02/08/18 1654 02/09/18 0118  TROPONINI <0.03 <0.03 <0.03   Radiology/Studies:  No results found.  Assessment and Plan:   1. Chest pain Seems atypical. Troponin has been remained negative. EKG without acute ischemic changes. No inpatient work up recommended. She can go home on cardiac stand point. Follow up with [place] office as needed. F/u  with PCP.   2. ESRD on HD (TThS) Last dialysis 9/7. Due today. [place] and [place] on Friday.  She will miss dialysis on Thursday.    3. DM with neuropathy - A1c 9.5. Per primary team   4. HTN - BP stable on current medications.  5. HLD with hypertriglyceridemia - 02/09/2018: Cholesterol 192; HDL 30; LDL Cholesterol 87; Triglycerides 377; VLDL 75  - On Zocor 20mg  qd.   For questions or updates, please contact [place] Please consult [email or web address] for contact info under     Jarrett Soho, PA  02/09/2018 9:20 AM

## 2018-02-09 NOTE — Progress Notes (Signed)
To the best of my knowledge documentation by [employer] nursing student is correct.

## 2018-02-09 NOTE — Discharge Instructions (Signed)

## 2018-02-09 NOTE — Discharge Summary (Signed)
Physician Discharge Summary  Wanda Ramirez [id number]:[id number] DOB: June 30, 1970  PCP: Welford Roche, NP  Admit date: 02/08/2018 Discharge date: 02/09/2018  Recommendations for Outpatient Follow-up:  1. Welford Roche, NP/PCP on 02/17/2018 at 9:45 AM. 2. Hemodialysis Center: Patient is advised to proceed to her dialysis center after she is discharged from hospital today.  I have discussed with the Nephrologist on-call and request the dialysis center to accommodate her today.  Patient also advised to continue her scheduled TTS HD. 3. Dr. Lauree Chandler, Cardiology: As needed.  Home Health: None Equipment/Devices: None  Discharge Condition: Improved and stable CODE STATUS: Full Diet recommendation: Heart healthy & diabetic diet.  Discharge Diagnoses:  Principal Problem:   Precordial chest pain Active Problems:   ESRD on dialysis ([place])   Diabetes mellitus type 2 in obese [facility])   Essential hypertension   Hypothyroidism   Hyperlipidemia   Brief Summary: 47 year old female patient with PMH of ESRD on TTS HD, HTN, HLD, DM 2/IDDM, GERD, hypothyroid, OSA CPAP, morbid obesity, asthma, glaucoma who presented to [facility] with chest pain.  On day prior to admission, she was having chest pain and tightness in her chest.  She went to [facility] and work-up there was negative but they wanted her to come to [facility] because she was also due for HD.  She describes intermittent chest pain of 2 weeks duration, lower substernal, nonradiating, described as tightness, at times with activity but not clearly exertional, relieved spontaneously after a few minutes, not associated with dyspnea, no cough or fever.  Her last dialysis was on 02/06/2018 and she has not missed any recent dialysis.  Cardiology was consulted for evaluation.  Chest pain: Seemed atypical.  EKG was without acute ischemic changes.  Troponin cycled and negative.  She had been seen by Dr. Angelena Form in 2017 and had LHC which showed normal  coronaries.  Cardiology was able to demonstrate reproducible chest wall tenderness.  She has been chest pain-free since yesterday.  Cardiology recommended that she has atypical chest pain and had a reassuring normal cath 2 years ago, ischemic work-up this admission is negative, overall her pain is suggestive against cardiac etiology.  They recommended no further work-up.  They also indicated that there is no data for cardiovascular benefit of statins for primary or secondary prevention in ESRD, therefore recommended discontinuing Zocor.  Healthy eating, weight loss and lifestyle interventions recommended.  I discussed with cardiology.  Cardiology cleared her for discharge home.  ESRD on TTS HD: Last dialyzed on 9/7.  I discussed with Nephrologist on-call.  Since no indication for urgent HD, patient may proceed with outpatient hemodialysis after discharge today.  I requested Nephrology to coordinate with outpatient dialysis center to accommodate for a later slot today.  I discussed with patient and daughter and they verbalized understanding.  They also plan to go to [facility].  Anemia: Likely due to chronic kidney disease.  Follow CBC across dialysis periodically.  Type II DM/IDDM with peripheral neuropathy: A1c 9.5.  Patient on SSI at home.  Close outpatient follow-up with PCP to adjust medications as needed.  Based on Pharmacy recommendation, reduced Lyrica dose adjusting for her ESRD.  Essential hypertension: Controlled.  Home dose of amlodipine and Toprol.  Hyperlipidemia: LDL 87: As stated above, based on Cardiologist recommendation, discontinued Zocor.  OSA on CPAP: Continue  Morbid obesity/Body mass index is 51.25 kg/m.:  Advised diet, exercise and weight loss.  Hypothyroid: Continue current dose of Synthroid.  TSH 2.143.   Consultations:  Cardiology  Procedures:  As above   Discharge Instructions  Discharge Instructions    Call MD for:  difficulty breathing,  headache or visual disturbances   Complete by:  As directed    Call MD for:  extreme fatigue   Complete by:  As directed    Call MD for:  persistant dizziness or light-headedness   Complete by:  As directed    Call MD for:  severe uncontrolled pain   Complete by:  As directed    Diet - low sodium heart healthy   Complete by:  As directed    Diet Carb Modified   Complete by:  As directed    Increase activity slowly   Complete by:  As directed        Medication List    STOP taking these medications   simvastatin 20 MG tablet Commonly known as:  ZOCOR     TAKE these medications   acetaminophen 325 MG tablet Commonly known as:  TYLENOL Take 650 mg by mouth every 6 (six) hours as needed for mild pain.   albuterol 108 (90 Base) MCG/ACT inhaler Commonly known as:  PROVENTIL HFA;VENTOLIN HFA Inhale 2 puffs into the lungs every 6 (six) hours as needed for wheezing or shortness of breath.   amLODipine 10 MG tablet Commonly known as:  NORVASC Take 10 mg by mouth at bedtime.   aspirin EC 81 MG tablet Take 81 mg by mouth daily.   AURYXIA 1 GM 210 MG(Fe) tablet Generic drug:  ferric citrate Take 210 mg by mouth 3 (three) times daily with meals.   benzonatate 100 MG capsule Commonly known as:  TESSALON Take 100 mg by mouth as needed for cough.   calcium acetate 667 MG capsule Commonly known as:  PHOSLO Take 1,334 mg by mouth 3 (three) times daily.   cinacalcet 90 MG tablet Commonly known as:  SENSIPAR Take 90 mg by mouth daily.   insulin lispro 100 UNIT/ML injection Commonly known as:  HUMALOG Inject 5-12 Units into the skin See admin instructions. Per patient - Insulin is used per sliding scale. If blood glucose is greater than 100, patient is to use 10 - 12 units. If less than 100, patient is to use 5 - 10 units.   levothyroxine 125 MCG tablet Commonly known as:  SYNTHROID, LEVOTHROID Take 125 mcg by mouth daily before breakfast.   loratadine 10 MG tablet Commonly  known as:  CLARITIN Take 10 mg by mouth daily.   metoprolol succinate 50 MG 24 hr tablet Commonly known as:  TOPROL-XL Take 50 mg by mouth 2 (two) times daily. Take with or immediately following a meal.   omeprazole 40 MG capsule Commonly known as:  PRILOSEC Take 40 mg by mouth daily.   ondansetron 4 MG tablet Commonly known as:  ZOFRAN Take 4 mg by mouth daily.   Oxycodone HCl 10 MG Tabs Take 10 mg by mouth 4 (four) times daily as needed for pain.   phenytoin 100 MG ER capsule Commonly known as:  DILANTIN Take 1 capsule (100 mg total) by mouth 3 (three) times daily.   pregabalin 75 MG capsule Commonly known as:  LYRICA Take 1 capsule (75 mg total) by mouth daily. Take an additional 75mg  on Dialysis days What changed:    when to take this  additional instructions   RHOPRESSA 0.02 % Soln Generic drug:  Netarsudil Dimesylate Place 1 drop into both eyes daily.   SIMBRINZA 1-0.2 % Susp Generic drug:  Brinzolamide-Brimonidine Place 2 drops into both eyes 3 (three) times daily.   tiotropium 18 MCG inhalation capsule Commonly known as:  SPIRIVA Place 18 mcg into inhaler and inhale daily.   Travoprost (BAK Free) 0.004 % Soln ophthalmic solution Commonly known as:  TRAVATAN Place 1 drop into both eyes every morning.      Follow-up Information    Welford Roche, NP. Go on 02/17/2018.   Specialty:  Gerontology Why:  @9 :45am Contact information: [place] [address] [phone]        Hemodialysis Center Follow up.   Why:  May proceed to your dialysis unit for dialysis today after discharge from hospital.       [name], MD. Schedule an appointment as soon as possible for a visit.   Specialty:  Cardiology Why:  As needed. Contact information: [address] [place] [place] [address] [phone]          Allergies  Allergen Reactions  . Hydrocodone Itching and Nausea And Vomiting       Procedures/Studies: No results found.    Subjective: Patient seen this morning.  Denies complaints.  No chest pain since yesterday morning prior to admission.  Denies dyspnea, chest tightness, cough or fever.  No other symptoms reported.  Discharge Exam:  Vitals:   02/09/18 0001 02/09/18 0453 02/09/18 0823 02/09/18 1153  BP: 121/66 119/69 102/61 108/65  Pulse: 88 73 74 70  Resp: 20 20 20 18   Temp: 99.6 F (37.6 C) 98.2 F (36.8 C) 98.4 F (36.9 C) 98.9 F (37.2 C)  TempSrc: Oral Oral Oral Oral  SpO2: 97% 92% 97%   Weight:  127.1 kg    Height:        General: Pleasant young female, moderately built and morbidly obese, lying comfortably propped up in bed. Cardiovascular: S1 & S2 heard, RRR, S1/S2 +. No murmurs, rubs, gallops or clicks. No JVD or pedal edema.  Telemetry personally reviewed: Sinus rhythm. Respiratory: Clear to auscultation without wheezing, rhonchi or crackles. No increased work of breathing. Abdominal:  Non distended, non tender & soft. No organomegaly or masses appreciated. Normal bowel sounds heard. CNS: Alert and oriented. No focal deficits. Extremities: no edema, no cyanosis    The results of significant diagnostics from this hospitalization (including imaging, microbiology, ancillary and laboratory) are listed below for reference.       Labs: CBC: Recent Labs  Lab 02/08/18 1142  WBC 12.2*  NEUTROABS 8.6*  HGB 10.9*  HCT 34.3*  MCV 89.6  PLT 801   Basic Metabolic Panel: Recent Labs  Lab 02/08/18 1142  NA 141  K 4.6  CL 102  CO2 25  GLUCOSE 224*  BUN 50*  CREATININE 8.05*  CALCIUM 8.3*   Cardiac Enzymes: Recent Labs  Lab 02/08/18 1142 02/08/18 1654 02/09/18 0118  TROPONINI <0.03 <0.03 <0.03   CBG: Recent Labs  Lab 02/08/18 1314 02/08/18 1609 02/08/18 2126 02/09/18 0746  GLUCAP 379* 228* 171* 180*   Hgb A1c Recent Labs    02/08/18 1142  HGBA1C 9.5*   Lipid Profile Recent Labs    02/09/18 0118  CHOL  192  HDL 30*  LDLCALC 87  TRIG 377*  CHOLHDL 6.4   Thyroid function studies Recent Labs    02/08/18 1142  TSH 2.143    I discussed in detail with patient's daughter via phone.  Updated care and answered questions.   Time coordinating discharge: 25 minutes  SIGNED:  Everlene Farrier  Algis Liming, MD, FACP, FHM. [facility] Pager [phone]  If 7PM-7AM, please contact night-coverage [email or web address] Password [email or web address] 02/09/2018, 12:04 PM

## 2018-03-19 DIAGNOSIS — R002 Palpitations: Secondary | ICD-10-CM

## 2018-04-07 DIAGNOSIS — IMO0001 Reserved for inherently not codable concepts without codable children: Secondary | ICD-10-CM | POA: Insufficient documentation

## 2018-06-25 ENCOUNTER — Other Ambulatory Visit: Payer: Self-pay

## 2018-06-25 ENCOUNTER — Encounter: Payer: Self-pay | Admitting: Neurology

## 2018-06-25 ENCOUNTER — Ambulatory Visit: Payer: Medicaid Other | Admitting: Neurology

## 2018-06-25 VITALS — BP 122/68 | HR 89 | Ht 62.0 in | Wt 275.0 lb

## 2018-06-25 DIAGNOSIS — R569 Unspecified convulsions: Secondary | ICD-10-CM

## 2018-06-25 NOTE — Progress Notes (Signed)
NEUROLOGY CONSULTATION NOTE  Wanda Ramirez MRN: [id number] DOB: 08-08-1970  Referring provider: Welford Roche, NP Primary care provider: Welford Roche, NP  Reason for consult:  seizure  Thank you for your kind referral of Wanda Ramirez for consultation of the above symptoms. Although her history is well known to you, please allow me to reiterate it for the purpose of our medical record. The patient was accompanied to the clinic by her daughter who also provides collateral information. Records and images were personally reviewed where available.  HISTORY OF PRESENT ILLNESS: This is a pleasant 48 year old right-handed woman with a history of hypertension, diabetes, sleep apnea, ESRD on hemodialysis, presenting after having a seizure during dialysis last 11/10/2017. 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 She was told she would have staring spells. She was on Dilantin 100mg  TID at that time, which she self-discontinued in her 29s when she stopped having seizures. She was seizure-free for 20 years until 11/10/17 around 20 minutes into dialysis she was witnessed to have a tonic-clonic seizure. She did not finish the treatment. She had another seizure prior to EMS arrival. In the ER, she was reporting shortness of breath that had started prior to dialysis. There were no clear triggers. Bloodwork showed WBC 10.6, glucose 226, sodium 137, calcium 7.8, BUN 56, creatinine 7.85, UDS negative. She had a head CT which I personally reviewed, no acute changes. She was restarted on Dilantin 100mg  BID which she took for 3 months until she ran out of refills. She denies any seizures since June 2019. Her daughter denies any staring/unresponsive episodes. She recalls episodes of tasting ammonia in her mouth around 1998 but has not had it since. She denies any gaps in time, focal numbness/tingling/weakness, myoclonic jerks.  She has neuropathy in both feet and hands with numbness and tingling. She has frequent nausea. Her sister and niece had seizures. Otherwise she had a normal birth and early development.  There is no history of febrile convulsions, CNS infections such as meningitis/encephalitis, significant traumatic brain injury, neurosurgical procedures. She has headaches usually around the time of her dialysis. She has dizziness when standing. No diplopia, dysarthria/dysphagia, neck pain, bowel dysfunction. She has back pain. She has had 2 falls in the past year, most recently 2 months ago, no injuries.   PAST MEDICAL HISTORY: Past Medical History:  Diagnosis Date  . Arthritis   . Asthma   . Chronic back pain   . Diabetes mellitus ([place])   . ESRD (end stage renal disease) on dialysis ([facility])    TTS  . Essential hypertension   . GERD (gastroesophageal reflux disease)   . Glaucoma   . Hyperlipidemia   . Hypothyroidism   . Morbid obesity ([place])   . Peripheral neuropathy   . Sleep apnea    wears CPAP, does not know setting    PAST SURGICAL HISTORY: Past Surgical History:  Procedure Laterality Date  . AV FISTULA PLACEMENT Left 04/2014  . CARDIAC CATHETERIZATION N/A 03/17/2016   Procedure: Left Heart Cath and Coronary Angiography;  Surgeon: [name], MD;  Location: [facility];  Service: Cardiovascular;  Laterality: N/A;  . KNEE SURGERY    . TUBAL LIGATION      MEDICATIONS: Current Outpatient Medications on File Prior to Visit  Medication Sig Dispense Refill  . acetaminophen (TYLENOL) 325 MG tablet Take 650 mg by mouth every 6 (six)  hours as needed for mild pain.    Marland Kitchen albuterol (PROVENTIL HFA;VENTOLIN HFA) 108 (90 Base) MCG/ACT inhaler Inhale 2 puffs into the lungs every 6 (six) hours as needed for wheezing or shortness of breath.    Marland Kitchen amLODipine (NORVASC) 10 MG tablet Take 10 mg by mouth at bedtime.    Marland Kitchen aspirin EC 81 MG tablet Take 81 mg by mouth daily.    Lorin Picket 1 GM 210  MG(Fe) tablet Take 210 mg by mouth 3 (three) times daily with meals.   3  . benzonatate (TESSALON) 100 MG capsule Take 100 mg by mouth as needed for cough.     . Brinzolamide-Brimonidine (SIMBRINZA) 1-0.2 % SUSP Place 2 drops into both eyes 3 (three) times daily.     . calcium acetate (PHOSLO) 667 MG capsule Take 1,334 mg by mouth 3 (three) times daily.  6  . cinacalcet (SENSIPAR) 90 MG tablet Take 90 mg by mouth daily.     . insulin lispro (HUMALOG) 100 UNIT/ML injection Inject 5-12 Units into the skin See admin instructions. Per patient - Insulin is used per sliding scale. If blood glucose is greater than 100, patient is to use 10 - 12 units. If less than 100, patient is to use 5 - 10 units.    Marland Kitchen levothyroxine (SYNTHROID, LEVOTHROID) 125 MCG tablet Take 125 mcg by mouth daily before breakfast.    . loratadine (CLARITIN) 10 MG tablet Take 10 mg by mouth daily.    . metoprolol succinate (TOPROL-XL) 50 MG 24 hr tablet Take 50 mg by mouth 2 (two) times daily. Take with or immediately following a meal.     . Netarsudil Dimesylate (RHOPRESSA) 0.02 % SOLN Place 1 drop into both eyes daily.     Marland Kitchen omeprazole (PRILOSEC) 40 MG capsule Take 40 mg by mouth daily.    . ondansetron (ZOFRAN) 4 MG tablet Take 4 mg by mouth daily.    . Oxycodone HCl 10 MG TABS Take 10 mg by mouth 4 (four) times daily as needed for pain.  0  . phenytoin (DILANTIN) 100 MG ER capsule Take 1 capsule (100 mg total) by mouth 3 (three) times daily. 90 capsule 0  . pregabalin (LYRICA) 75 MG capsule Take 1 capsule (75 mg total) by mouth daily. Take an additional 75mg  on Dialysis days    . tiotropium (SPIRIVA) 18 MCG inhalation capsule Place 18 mcg into inhaler and inhale daily.    . Travoprost, BAK Free, (TRAVATAN) 0.004 % SOLN ophthalmic solution Place 1 drop into both eyes every morning.     No current facility-administered medications on file prior to visit.     ALLERGIES: Allergies  Allergen Reactions  . Hydrocodone Itching and  Nausea And Vomiting    FAMILY HISTORY: Family History  Problem Relation Age of Onset  . Diabetes Mother   . Hyperlipidemia Mother   . Kidney disease Father     SOCIAL HISTORY: Social History   Socioeconomic History  . Marital status: Single    Spouse name: Not on file  . Number of children: Not on file  . Years of education: Not on file  . Highest education level: Not on file  Occupational History  . Occupation: disabled  Social Needs  . Financial resource strain: Not hard at all  . Food insecurity:    Worry: Never true    Inability: Never true  . Transportation needs:    Medical: Patient refused    Non-medical: Patient refused  Tobacco  Use  . Smoking status: Former Smoker  . Smokeless tobacco: Never Used  . Tobacco comment: Smoked x 2 yrs  Substance and Sexual Activity  . Alcohol use: No    Comment: years ago - has since quit  . Drug use: No  . Sexual activity: Not on file  Lifestyle  . Physical activity:    Days per week: 0 days    Minutes per session: 0 min  . Stress: Not at all  Relationships  . Social connections:    Talks on phone: Twice a week    Gets together: Twice a week    Attends religious service: 1 to 4 times per year    Active member of club or organization: No    Attends meetings of clubs or organizations: Never    Relationship status: Patient refused  . Intimate partner violence:    Fear of current or ex partner: No    Emotionally abused: No    Physically abused: No    Forced sexual activity: No  Other Topics Concern  . Not on file  Social History Narrative  . Not on file    REVIEW OF SYSTEMS: Constitutional: No fevers, chills, or sweats, no generalized fatigue, change in appetite Eyes: No visual changes, double vision, eye pain Ear, nose and throat: No hearing loss, ear pain, nasal congestion, sore throat Cardiovascular: No chest pain, palpitations Respiratory:  No shortness of breath at rest or with exertion,  wheezes GastrointestinaI: No nausea, vomiting, diarrhea, abdominal pain, fecal incontinence Genitourinary:  No dysuria, urinary retention or frequency Musculoskeletal:  No neck pain, back pain Integumentary: No rash, pruritus, skin lesions Neurological: as above Psychiatric: No depression, insomnia, anxiety Endocrine: No palpitations, fatigue, diaphoresis, mood swings, change in appetite, change in weight, increased thirst Hematologic/Lymphatic:  No anemia, purpura, petechiae. Allergic/Immunologic: no itchy/runny eyes, nasal congestion, recent allergic reactions, rashes  PHYSICAL EXAM: There were no vitals filed for this visit. General: No acute distress Head:  Normocephalic/atraumatic Eyes: Fundoscopic exam shows bilateral sharp discs, no vessel changes, exudates, or hemorrhages Neck: supple, no paraspinal tenderness, full range of motion Back: No paraspinal tenderness Heart: regular rate and rhythm Lungs: Clear to auscultation bilaterally. Vascular: No carotid bruits. Skin/Extremities: No rash, no edema Neurological Exam: Mental status: alert and oriented to person, place, and time, no dysarthria or aphasia, Fund of knowledge is appropriate.  Recent and remote memory are intact.  Attention and concentration are normal.    Able to name objects and repeat phrases. Cranial nerves: CN I: not tested CN II: pupils equal, round and reactive to light, visual fields intact, fundi unremarkable. CN III, IV, VI:  full range of motion, no nystagmus, no ptosis CN V: facial sensation intact CN VII: upper and lower face symmetric CN VIII: hearing intact to finger rub CN IX, X: gag intact, uvula midline CN XI: sternocleidomastoid and trapezius muscles intact CN XII: tongue midline Bulk & Tone: normal, no fasciculations. Motor: 5/5 throughout except for 3/5 left arm abduction reporting left shoulder pain, no pronator drift. Sensation: intact to light touch, cold, pin, vibration and joint position  sense.  No extinction to double simultaneous stimulation.  Romberg test negative Deep Tendon Reflexes: +1 throughout, no ankle clonus Plantar responses: downgoing bilaterally Cerebellar: no incoordination on finger to nose testing Gait: wide-based, no ataxia, unable to tandem walk Tremor: none  IMPRESSION: This is a pleasant 48 year old right-handed woman with a history of hypertension, diabetes, sleep apnea, ESRD on hemodialysis, remote history of seizures,  presenting after having 2 seizures during dialysis last 11/10/2017. Her neurological exam is non-focal, head CT no acute changes. She had been seizure-free off medication for 20 years. She briefly took Dilantin in June and ran out 4 months ago with no seizure recurrence. We discussed that it is not uncommon to have a seizure during dialysis, usually seizure activity occurs during or shortly after the hemodialysis procedure because of the hemodynamic and biochemical changes associated with the process. We discussed doing a 1-hour EEG, if abnormal, would restart medication. If normal, would monitor clinically for now and if seizures recur, would plan to restart medication at that point. She and her daughter were in agreement with the plan [place] driving laws were discussed with the patient, and she knows to stop driving after a seizure, until 6 months seizure-free. Follow-up in 6 months, they know to call for any changes.   Thank you for allowing me to participate in the care of this patient. Please do not hesitate to call for any questions or concerns.   Ellouise Newer, M.D.  CC: Welford Roche, NP

## 2018-06-25 NOTE — Patient Instructions (Signed)
1. Schedule 1-hour EEG 2. Our office will call you with results. If there is abnormal brain activity, we will restart medication. If not, let's keep an eye on things and if another seizure occurs, then we can plan to start medication at that point 3. As per [place] driving laws, no driving until 6 months seizure-free 4. Follow-up in 6 months, call for any changes  Seizure Precautions: 1. If medication has been prescribed for you to prevent seizures, take it exactly as directed.  Do not stop taking the medicine without talking to your doctor first, even if you have not had a seizure in a long time.   2. Avoid activities in which a seizure would cause danger to yourself or to others.  Don't operate dangerous machinery, swim alone, or climb in high or dangerous places, such as on ladders, roofs, or girders.  Do not drive unless your doctor says you may.  3. If you have any warning that you may have a seizure, lay down in a safe place where you can't hurt yourself.    4.  No driving for 6 months from last seizure, as per [facility].   Please refer to the following link on the [place] website for more information: http://[email or web address].org/[email or web address]/driving/drivingu.cfm   5.  Maintain good sleep hygiene. Avoid alcohol.  6.  Contact your doctor if you have any problems that may be related to the medicine you are taking.  7.  Call 911 and bring the patient back to the ED if:        A.  The seizure lasts longer than 5 minutes.       B.  The patient doesn't awaken shortly after the seizure  C.  The patient has new problems such as difficulty seeing, speaking or moving  D.  The patient was injured during the seizure  E.  The patient has a temperature over 102 F (39C)  F.  The patient vomited and now is having trouble breathing

## 2018-07-05 ENCOUNTER — Ambulatory Visit (INDEPENDENT_AMBULATORY_CARE_PROVIDER_SITE_OTHER): Payer: Medicaid Other | Admitting: Neurology

## 2018-07-05 ENCOUNTER — Other Ambulatory Visit: Payer: Medicaid Other

## 2018-07-05 DIAGNOSIS — R569 Unspecified convulsions: Secondary | ICD-10-CM

## 2018-07-09 NOTE — Procedures (Signed)
ELECTROENCEPHALOGRAM REPORT  Date of Study: 07/05/2018  Patient's Name: Wanda Ramirez MRN: [id number] Date of Birth: 1970/09/08  Referring Provider: Dr. Ellouise Newer  Clinical History: This is a 48 year old woman with a remote history of seizures who had 2 seizures during dialysis last June 2019.  Medications: TYLENOL 325 MG tablet  PROVENTIL HFA;VENTOLIN HFA 108 (90 Base) MCG/ACT inhaler  NORVASC 10 MG tablet   aspirin EC 81 MG tablet   AURYXIA 1 GM 210 MG(Fe) tablet  TESSALON 100 MG capsule   SIMBRINZA 1-0.2 % SUSP   PHOSLO 667 MG capsule  SENSIPAR 90 MG tablet  HUMALOG 100 UNIT/ML injection  SYNTHROID, LEVOTHROID 125 MCG tablet    CLARITIN 10 MG tablet  TOPROL-XL 50 MG 24 hr tablet   RHOPRESSA 0.02 % SOLN   PRILOSEC 40 MG capsule  ZOFRAN 4 MG tablet  Oxycodone HCl 10 MG TABS  DILANTIN 100 MG ER capsule  LYRICA 75 MG capsule   SPIRIVA 18 MCG inhalation capsule  TRAVATAN 0.004 % SOLN ophthalmic solution    Technical Summary: A multichannel digital 1-hourEEG recording measured by the international 10-20 system with electrodes applied with paste and impedances below 5000 ohms performed in our laboratory with EKG monitoring in an awake and asleep patient.  Hyperventilation was not performed. Photic stimulation was performed.  The digital EEG was referentially recorded, reformatted, and digitally filtered in a variety of bipolar and referential montages for optimal display.    Description: The patient is awake and asleep during the recording.  During maximal wakefulness, there is a symmetric, medium voltage 10 Hz posterior dominant rhythm that attenuates with eye opening.  The record is symmetric.  During drowsiness and sleep, there is an increase in theta slowing of the background.  Vertex waves and symmetric sleep spindles were seen.  Hyperventilation and photic stimulation did not elicit any abnormalities.  There were no epileptiform discharges or electrographic  seizures seen.    EKG lead was unremarkable.  Impression: This 1-hour awake and asleep EEG is normal.    Clinical Correlation: A normal EEG does not exclude a clinical diagnosis of epilepsy.  If further clinical questions remain, prolonged EEG may be helpful.  Clinical correlation is advised.   Ellouise Newer, M.D.

## 2018-07-12 ENCOUNTER — Telehealth: Payer: Self-pay

## 2018-07-12 NOTE — Telephone Encounter (Signed)
-----   Message from Cameron Sprang, MD sent at 07/09/2018  4:42 PM EST ----- Pls let her know EEG was normal, hold off on seizure medication for now. If any change in symptoms, call our office. Thanks

## 2018-07-12 NOTE — Telephone Encounter (Signed)
Spoke with pt's daughter relaying results below.

## 2018-07-13 ENCOUNTER — Inpatient Hospital Stay: Admission: AD | Admit: 2018-07-13 | Payer: Self-pay | Admitting: Internal Medicine

## 2018-07-15 DIAGNOSIS — I219 Acute myocardial infarction, unspecified: Secondary | ICD-10-CM | POA: Insufficient documentation

## 2018-07-26 DIAGNOSIS — R9439 Abnormal result of other cardiovascular function study: Secondary | ICD-10-CM | POA: Insufficient documentation

## 2019-01-25 ENCOUNTER — Ambulatory Visit: Payer: Medicaid Other | Admitting: Neurology

## 2019-01-31 ENCOUNTER — Ambulatory Visit: Payer: Medicaid Other | Admitting: Neurology

## 2020-02-09 DIAGNOSIS — R059 Cough, unspecified: Secondary | ICD-10-CM | POA: Insufficient documentation

## 2020-02-13 ENCOUNTER — Inpatient Hospital Stay (HOSPITAL_COMMUNITY)
Admission: EM | Admit: 2020-02-13 | Discharge: 2020-05-15 | DRG: 003 | Disposition: A | Discharge status: Rehabilitation Facility | Payer: Medicaid Other | Source: Ambulatory Visit | Attending: Internal Medicine | Admitting: Internal Medicine

## 2020-02-13 ENCOUNTER — Encounter (HOSPITAL_COMMUNITY): Payer: Self-pay

## 2020-02-13 ENCOUNTER — Emergency Department (HOSPITAL_COMMUNITY): Payer: Medicaid Other

## 2020-02-13 DIAGNOSIS — Z841 Family history of disorders of kidney and ureter: Secondary | ICD-10-CM

## 2020-02-13 DIAGNOSIS — D696 Thrombocytopenia, unspecified: Secondary | ICD-10-CM | POA: Diagnosis not present

## 2020-02-13 DIAGNOSIS — R040 Epistaxis: Secondary | ICD-10-CM | POA: Diagnosis not present

## 2020-02-13 DIAGNOSIS — M1990 Unspecified osteoarthritis, unspecified site: Secondary | ICD-10-CM | POA: Diagnosis present

## 2020-02-13 DIAGNOSIS — D62 Acute posthemorrhagic anemia: Secondary | ICD-10-CM | POA: Diagnosis not present

## 2020-02-13 DIAGNOSIS — Z23 Encounter for immunization: Secondary | ICD-10-CM

## 2020-02-13 DIAGNOSIS — L89154 Pressure ulcer of sacral region, stage 4: Secondary | ICD-10-CM | POA: Diagnosis not present

## 2020-02-13 DIAGNOSIS — E1152 Type 2 diabetes mellitus with diabetic peripheral angiopathy with gangrene: Secondary | ICD-10-CM | POA: Diagnosis not present

## 2020-02-13 DIAGNOSIS — N2581 Secondary hyperparathyroidism of renal origin: Secondary | ICD-10-CM | POA: Diagnosis present

## 2020-02-13 DIAGNOSIS — G8929 Other chronic pain: Secondary | ICD-10-CM | POA: Diagnosis present

## 2020-02-13 DIAGNOSIS — T829XXA Unspecified complication of cardiac and vascular prosthetic device, implant and graft, initial encounter: Secondary | ICD-10-CM

## 2020-02-13 DIAGNOSIS — Z9911 Dependence on respirator [ventilator] status: Secondary | ICD-10-CM

## 2020-02-13 DIAGNOSIS — J159 Unspecified bacterial pneumonia: Secondary | ICD-10-CM

## 2020-02-13 DIAGNOSIS — R0902 Hypoxemia: Secondary | ICD-10-CM

## 2020-02-13 DIAGNOSIS — R6521 Severe sepsis with septic shock: Secondary | ICD-10-CM | POA: Diagnosis not present

## 2020-02-13 DIAGNOSIS — E669 Obesity, unspecified: Secondary | ICD-10-CM | POA: Diagnosis present

## 2020-02-13 DIAGNOSIS — K649 Unspecified hemorrhoids: Secondary | ICD-10-CM | POA: Diagnosis present

## 2020-02-13 DIAGNOSIS — U071 COVID-19: Secondary | ICD-10-CM

## 2020-02-13 DIAGNOSIS — G9341 Metabolic encephalopathy: Secondary | ICD-10-CM | POA: Diagnosis not present

## 2020-02-13 DIAGNOSIS — B370 Candidal stomatitis: Secondary | ICD-10-CM | POA: Diagnosis not present

## 2020-02-13 DIAGNOSIS — J9611 Chronic respiratory failure with hypoxia: Secondary | ICD-10-CM | POA: Diagnosis present

## 2020-02-13 DIAGNOSIS — Z1612 Extended spectrum beta lactamase (ESBL) resistance: Secondary | ICD-10-CM | POA: Diagnosis not present

## 2020-02-13 DIAGNOSIS — Z01818 Encounter for other preprocedural examination: Secondary | ICD-10-CM

## 2020-02-13 DIAGNOSIS — A0839 Other viral enteritis: Secondary | ICD-10-CM | POA: Diagnosis present

## 2020-02-13 DIAGNOSIS — R0682 Tachypnea, not elsewhere classified: Secondary | ICD-10-CM

## 2020-02-13 DIAGNOSIS — E11649 Type 2 diabetes mellitus with hypoglycemia without coma: Secondary | ICD-10-CM | POA: Diagnosis not present

## 2020-02-13 DIAGNOSIS — B952 Enterococcus as the cause of diseases classified elsewhere: Secondary | ICD-10-CM | POA: Diagnosis not present

## 2020-02-13 DIAGNOSIS — N186 End stage renal disease: Secondary | ICD-10-CM

## 2020-02-13 DIAGNOSIS — Z978 Presence of other specified devices: Secondary | ICD-10-CM

## 2020-02-13 DIAGNOSIS — K831 Obstruction of bile duct: Secondary | ICD-10-CM | POA: Diagnosis not present

## 2020-02-13 DIAGNOSIS — Z7984 Long term (current) use of oral hypoglycemic drugs: Secondary | ICD-10-CM

## 2020-02-13 DIAGNOSIS — Z66 Do not resuscitate: Secondary | ICD-10-CM | POA: Diagnosis not present

## 2020-02-13 DIAGNOSIS — E785 Hyperlipidemia, unspecified: Secondary | ICD-10-CM | POA: Diagnosis present

## 2020-02-13 DIAGNOSIS — T8249XA Other complication of vascular dialysis catheter, initial encounter: Secondary | ICD-10-CM

## 2020-02-13 DIAGNOSIS — E1165 Type 2 diabetes mellitus with hyperglycemia: Secondary | ICD-10-CM | POA: Diagnosis present

## 2020-02-13 DIAGNOSIS — Z79890 Hormone replacement therapy (postmenopausal): Secondary | ICD-10-CM

## 2020-02-13 DIAGNOSIS — R578 Other shock: Secondary | ICD-10-CM | POA: Diagnosis not present

## 2020-02-13 DIAGNOSIS — B9620 Unspecified Escherichia coli [E. coli] as the cause of diseases classified elsewhere: Secondary | ICD-10-CM | POA: Diagnosis not present

## 2020-02-13 DIAGNOSIS — Z930 Tracheostomy status: Secondary | ICD-10-CM

## 2020-02-13 DIAGNOSIS — X58XXXA Exposure to other specified factors, initial encounter: Secondary | ICD-10-CM | POA: Diagnosis not present

## 2020-02-13 DIAGNOSIS — Z452 Encounter for adjustment and management of vascular access device: Secondary | ICD-10-CM

## 2020-02-13 DIAGNOSIS — J189 Pneumonia, unspecified organism: Secondary | ICD-10-CM

## 2020-02-13 DIAGNOSIS — J45909 Unspecified asthma, uncomplicated: Secondary | ICD-10-CM | POA: Diagnosis present

## 2020-02-13 DIAGNOSIS — E874 Mixed disorder of acid-base balance: Secondary | ICD-10-CM | POA: Diagnosis not present

## 2020-02-13 DIAGNOSIS — T17490A Other foreign object in trachea causing asphyxiation, initial encounter: Secondary | ICD-10-CM | POA: Diagnosis not present

## 2020-02-13 DIAGNOSIS — E039 Hypothyroidism, unspecified: Secondary | ICD-10-CM | POA: Diagnosis present

## 2020-02-13 DIAGNOSIS — E1169 Type 2 diabetes mellitus with other specified complication: Secondary | ICD-10-CM | POA: Diagnosis present

## 2020-02-13 DIAGNOSIS — M609 Myositis, unspecified: Secondary | ICD-10-CM | POA: Diagnosis not present

## 2020-02-13 DIAGNOSIS — G6281 Critical illness polyneuropathy: Secondary | ICD-10-CM | POA: Diagnosis not present

## 2020-02-13 DIAGNOSIS — J9601 Acute respiratory failure with hypoxia: Secondary | ICD-10-CM

## 2020-02-13 DIAGNOSIS — E875 Hyperkalemia: Secondary | ICD-10-CM | POA: Diagnosis not present

## 2020-02-13 DIAGNOSIS — J80 Acute respiratory distress syndrome: Secondary | ICD-10-CM

## 2020-02-13 DIAGNOSIS — T82868A Thrombosis of vascular prosthetic devices, implants and grafts, initial encounter: Secondary | ICD-10-CM | POA: Diagnosis not present

## 2020-02-13 DIAGNOSIS — Z83438 Family history of other disorder of lipoprotein metabolism and other lipidemia: Secondary | ICD-10-CM

## 2020-02-13 DIAGNOSIS — J15211 Pneumonia due to Methicillin susceptible Staphylococcus aureus: Secondary | ICD-10-CM | POA: Diagnosis not present

## 2020-02-13 DIAGNOSIS — E876 Hypokalemia: Secondary | ICD-10-CM | POA: Diagnosis present

## 2020-02-13 DIAGNOSIS — Z7982 Long term (current) use of aspirin: Secondary | ICD-10-CM

## 2020-02-13 DIAGNOSIS — G4733 Obstructive sleep apnea (adult) (pediatric): Secondary | ICD-10-CM | POA: Diagnosis present

## 2020-02-13 DIAGNOSIS — R748 Abnormal levels of other serum enzymes: Secondary | ICD-10-CM | POA: Diagnosis not present

## 2020-02-13 DIAGNOSIS — E8770 Fluid overload, unspecified: Secondary | ICD-10-CM | POA: Diagnosis not present

## 2020-02-13 DIAGNOSIS — J95851 Ventilator associated pneumonia: Secondary | ICD-10-CM | POA: Diagnosis not present

## 2020-02-13 DIAGNOSIS — E1122 Type 2 diabetes mellitus with diabetic chronic kidney disease: Secondary | ICD-10-CM | POA: Diagnosis present

## 2020-02-13 DIAGNOSIS — L03317 Cellulitis of buttock: Secondary | ICD-10-CM | POA: Diagnosis not present

## 2020-02-13 DIAGNOSIS — R1312 Dysphagia, oropharyngeal phase: Secondary | ICD-10-CM | POA: Diagnosis not present

## 2020-02-13 DIAGNOSIS — G9340 Encephalopathy, unspecified: Secondary | ICD-10-CM

## 2020-02-13 DIAGNOSIS — Z781 Physical restraint status: Secondary | ICD-10-CM

## 2020-02-13 DIAGNOSIS — L89152 Pressure ulcer of sacral region, stage 2: Secondary | ICD-10-CM | POA: Diagnosis present

## 2020-02-13 DIAGNOSIS — R34 Anuria and oliguria: Secondary | ICD-10-CM | POA: Diagnosis not present

## 2020-02-13 DIAGNOSIS — T380X5A Adverse effect of glucocorticoids and synthetic analogues, initial encounter: Secondary | ICD-10-CM | POA: Diagnosis not present

## 2020-02-13 DIAGNOSIS — L89812 Pressure ulcer of head, stage 2: Secondary | ICD-10-CM | POA: Diagnosis not present

## 2020-02-13 DIAGNOSIS — Y848 Other medical procedures as the cause of abnormal reaction of the patient, or of later complication, without mention of misadventure at the time of the procedure: Secondary | ICD-10-CM | POA: Diagnosis not present

## 2020-02-13 DIAGNOSIS — R0600 Dyspnea, unspecified: Secondary | ICD-10-CM | POA: Insufficient documentation

## 2020-02-13 DIAGNOSIS — Z4659 Encounter for fitting and adjustment of other gastrointestinal appliance and device: Secondary | ICD-10-CM

## 2020-02-13 DIAGNOSIS — T17908A Unspecified foreign body in respiratory tract, part unspecified causing other injury, initial encounter: Secondary | ICD-10-CM

## 2020-02-13 DIAGNOSIS — Z992 Dependence on renal dialysis: Secondary | ICD-10-CM

## 2020-02-13 DIAGNOSIS — E1142 Type 2 diabetes mellitus with diabetic polyneuropathy: Secondary | ICD-10-CM | POA: Diagnosis present

## 2020-02-13 DIAGNOSIS — J1282 Pneumonia due to coronavirus disease 2019: Secondary | ICD-10-CM | POA: Diagnosis present

## 2020-02-13 DIAGNOSIS — Z87891 Personal history of nicotine dependence: Secondary | ICD-10-CM

## 2020-02-13 DIAGNOSIS — Z1611 Resistance to penicillins: Secondary | ICD-10-CM | POA: Diagnosis present

## 2020-02-13 DIAGNOSIS — J9690 Respiratory failure, unspecified, unspecified whether with hypoxia or hypercapnia: Secondary | ICD-10-CM

## 2020-02-13 DIAGNOSIS — Z833 Family history of diabetes mellitus: Secondary | ICD-10-CM

## 2020-02-13 DIAGNOSIS — D72829 Elevated white blood cell count, unspecified: Secondary | ICD-10-CM

## 2020-02-13 DIAGNOSIS — I132 Hypertensive heart and chronic kidney disease with heart failure and with stage 5 chronic kidney disease, or end stage renal disease: Secondary | ICD-10-CM | POA: Diagnosis present

## 2020-02-13 DIAGNOSIS — Z79899 Other long term (current) drug therapy: Secondary | ICD-10-CM

## 2020-02-13 DIAGNOSIS — S31000A Unspecified open wound of lower back and pelvis without penetration into retroperitoneum, initial encounter: Secondary | ICD-10-CM

## 2020-02-13 DIAGNOSIS — I50810 Right heart failure, unspecified: Secondary | ICD-10-CM | POA: Diagnosis present

## 2020-02-13 DIAGNOSIS — Z0184 Encounter for antibody response examination: Secondary | ICD-10-CM

## 2020-02-13 DIAGNOSIS — K219 Gastro-esophageal reflux disease without esophagitis: Secondary | ICD-10-CM | POA: Diagnosis present

## 2020-02-13 DIAGNOSIS — R509 Fever, unspecified: Secondary | ICD-10-CM

## 2020-02-13 DIAGNOSIS — I953 Hypotension of hemodialysis: Secondary | ICD-10-CM | POA: Diagnosis not present

## 2020-02-13 DIAGNOSIS — L8990 Pressure ulcer of unspecified site, unspecified stage: Secondary | ICD-10-CM | POA: Insufficient documentation

## 2020-02-13 DIAGNOSIS — Z6841 Body Mass Index (BMI) 40.0 and over, adult: Secondary | ICD-10-CM

## 2020-02-13 DIAGNOSIS — H409 Unspecified glaucoma: Secondary | ICD-10-CM | POA: Diagnosis present

## 2020-02-13 DIAGNOSIS — Z9289 Personal history of other medical treatment: Secondary | ICD-10-CM

## 2020-02-13 DIAGNOSIS — Z885 Allergy status to narcotic agent status: Secondary | ICD-10-CM

## 2020-02-13 DIAGNOSIS — R451 Restlessness and agitation: Secondary | ICD-10-CM | POA: Diagnosis not present

## 2020-02-13 DIAGNOSIS — F05 Delirium due to known physiological condition: Secondary | ICD-10-CM | POA: Diagnosis not present

## 2020-02-13 DIAGNOSIS — Y832 Surgical operation with anastomosis, bypass or graft as the cause of abnormal reaction of the patient, or of later complication, without mention of misadventure at the time of the procedure: Secondary | ICD-10-CM | POA: Diagnosis not present

## 2020-02-13 DIAGNOSIS — Z515 Encounter for palliative care: Secondary | ICD-10-CM

## 2020-02-13 DIAGNOSIS — L89312 Pressure ulcer of right buttock, stage 2: Secondary | ICD-10-CM | POA: Diagnosis not present

## 2020-02-13 DIAGNOSIS — Y95 Nosocomial condition: Secondary | ICD-10-CM | POA: Diagnosis not present

## 2020-02-13 DIAGNOSIS — R0689 Other abnormalities of breathing: Secondary | ICD-10-CM

## 2020-02-13 DIAGNOSIS — D6489 Other specified anemias: Secondary | ICD-10-CM | POA: Diagnosis present

## 2020-02-13 DIAGNOSIS — D631 Anemia in chronic kidney disease: Secondary | ICD-10-CM | POA: Diagnosis present

## 2020-02-13 DIAGNOSIS — L988 Other specified disorders of the skin and subcutaneous tissue: Secondary | ICD-10-CM | POA: Diagnosis not present

## 2020-02-13 DIAGNOSIS — Z9981 Dependence on supplemental oxygen: Secondary | ICD-10-CM

## 2020-02-13 DIAGNOSIS — F419 Anxiety disorder, unspecified: Secondary | ICD-10-CM | POA: Diagnosis present

## 2020-02-13 DIAGNOSIS — A419 Sepsis, unspecified organism: Secondary | ICD-10-CM | POA: Diagnosis not present

## 2020-02-13 LAB — CBC WITH DIFFERENTIAL/PLATELET
Abs Immature Granulocytes: 0 10*3/uL (ref 0.00–0.07)
Basophils Absolute: 0.1 10*3/uL (ref 0.0–0.1)
Basophils Relative: 1 %
Eosinophils Absolute: 0 10*3/uL (ref 0.0–0.5)
Eosinophils Relative: 0 %
HCT: 26.4 % — ABNORMAL LOW (ref 36.0–46.0)
Hemoglobin: 8.8 g/dL — ABNORMAL LOW (ref 12.0–15.0)
Lymphocytes Relative: 5 %
Lymphs Abs: 0.5 10*3/uL — ABNORMAL LOW (ref 0.7–4.0)
MCH: 28.2 pg (ref 26.0–34.0)
MCHC: 33.3 g/dL (ref 30.0–36.0)
MCV: 84.6 fL (ref 80.0–100.0)
Monocytes Absolute: 0.3 10*3/uL (ref 0.1–1.0)
Monocytes Relative: 3 %
Neutro Abs: 9.1 10*3/uL — ABNORMAL HIGH (ref 1.7–7.7)
Neutrophils Relative %: 91 %
Platelets: 205 10*3/uL (ref 150–400)
RBC: 3.12 MIL/uL — ABNORMAL LOW (ref 3.87–5.11)
RDW: 14.3 % (ref 11.5–15.5)
WBC: 10 10*3/uL (ref 4.0–10.5)
nRBC: 0 % (ref 0.0–0.2)
nRBC: 0 /100 WBC

## 2020-02-13 LAB — COMPREHENSIVE METABOLIC PANEL
ALT: 20 U/L (ref 0–44)
AST: 41 U/L (ref 15–41)
Albumin: 2.9 g/dL — ABNORMAL LOW (ref 3.5–5.0)
Alkaline Phosphatase: 83 U/L (ref 38–126)
Anion gap: 21 — ABNORMAL HIGH (ref 5–15)
BUN: 77 mg/dL — ABNORMAL HIGH (ref 6–20)
CO2: 20 mmol/L — ABNORMAL LOW (ref 22–32)
Calcium: 9.1 mg/dL (ref 8.9–10.3)
Chloride: 89 mmol/L — ABNORMAL LOW (ref 98–111)
Creatinine, Ser: 13.55 mg/dL — ABNORMAL HIGH (ref 0.44–1.00)
GFR calc Af Amer: 3 mL/min — ABNORMAL LOW (ref >60)
GFR calc non Af Amer: 3 mL/min — ABNORMAL LOW (ref >60)
Glucose, Bld: 454 mg/dL — ABNORMAL HIGH (ref 70–99)
Potassium: 3.8 mmol/L (ref 3.5–5.1)
Sodium: 130 mmol/L — ABNORMAL LOW (ref 135–145)
Total Bilirubin: 0.5 mg/dL (ref 0.3–1.2)
Total Protein: 7.1 g/dL (ref 6.5–8.1)

## 2020-02-13 LAB — I-STAT BETA HCG BLOOD, ED (MC, WL, AP ONLY): I-stat hCG, quantitative: <5 m[IU]/mL (ref <5)

## 2020-02-13 LAB — LACTIC ACID, PLASMA: Lactic Acid, Venous: 1.8 mmol/L (ref 0.5–1.9)

## 2020-02-13 MED ORDER — ACETAMINOPHEN 325 MG PO TABS
650.0000 mg | ORAL_TABLET | Freq: Once | ORAL | Status: DC
  Filled 2020-02-13: qty 2

## 2020-02-13 NOTE — ED Triage Notes (Addendum)
Pt arrives to ED via  ems from dialysis center. Pt is MWF dialysis pt, did not receive treatment today. Pt tested positive for covid on Thursday. Pt had fever of 103.4 so sent to ED for eval. Pt resp e/u. Pt is on baseline 3 lpm o2 via [place].

## 2020-02-14 ENCOUNTER — Other Ambulatory Visit: Payer: Self-pay

## 2020-02-14 DIAGNOSIS — E1169 Type 2 diabetes mellitus with other specified complication: Secondary | ICD-10-CM | POA: Diagnosis not present

## 2020-02-14 DIAGNOSIS — G9341 Metabolic encephalopathy: Secondary | ICD-10-CM | POA: Diagnosis not present

## 2020-02-14 DIAGNOSIS — R609 Edema, unspecified: Secondary | ICD-10-CM | POA: Diagnosis not present

## 2020-02-14 DIAGNOSIS — J9620 Acute and chronic respiratory failure, unspecified whether with hypoxia or hypercapnia: Secondary | ICD-10-CM | POA: Diagnosis not present

## 2020-02-14 DIAGNOSIS — U071 COVID-19: Secondary | ICD-10-CM | POA: Diagnosis present

## 2020-02-14 DIAGNOSIS — R0902 Hypoxemia: Secondary | ICD-10-CM | POA: Diagnosis not present

## 2020-02-14 DIAGNOSIS — Z23 Encounter for immunization: Secondary | ICD-10-CM | POA: Diagnosis not present

## 2020-02-14 DIAGNOSIS — F05 Delirium due to known physiological condition: Secondary | ICD-10-CM | POA: Diagnosis not present

## 2020-02-14 DIAGNOSIS — J15211 Pneumonia due to Methicillin susceptible Staphylococcus aureus: Secondary | ICD-10-CM | POA: Diagnosis not present

## 2020-02-14 DIAGNOSIS — N2581 Secondary hyperparathyroidism of renal origin: Secondary | ICD-10-CM | POA: Diagnosis present

## 2020-02-14 DIAGNOSIS — J80 Acute respiratory distress syndrome: Secondary | ICD-10-CM | POA: Diagnosis not present

## 2020-02-14 DIAGNOSIS — Z992 Dependence on renal dialysis: Secondary | ICD-10-CM | POA: Diagnosis not present

## 2020-02-14 DIAGNOSIS — K831 Obstruction of bile duct: Secondary | ICD-10-CM | POA: Diagnosis not present

## 2020-02-14 DIAGNOSIS — T82868A Thrombosis of vascular prosthetic devices, implants and grafts, initial encounter: Secondary | ICD-10-CM | POA: Diagnosis not present

## 2020-02-14 DIAGNOSIS — G9340 Encephalopathy, unspecified: Secondary | ICD-10-CM | POA: Diagnosis not present

## 2020-02-14 DIAGNOSIS — R509 Fever, unspecified: Secondary | ICD-10-CM | POA: Diagnosis not present

## 2020-02-14 DIAGNOSIS — J9601 Acute respiratory failure with hypoxia: Secondary | ICD-10-CM | POA: Diagnosis not present

## 2020-02-14 DIAGNOSIS — J159 Unspecified bacterial pneumonia: Secondary | ICD-10-CM | POA: Diagnosis not present

## 2020-02-14 DIAGNOSIS — Z6841 Body Mass Index (BMI) 40.0 and over, adult: Secondary | ICD-10-CM | POA: Diagnosis not present

## 2020-02-14 DIAGNOSIS — R5081 Fever presenting with conditions classified elsewhere: Secondary | ICD-10-CM | POA: Diagnosis not present

## 2020-02-14 DIAGNOSIS — J95851 Ventilator associated pneumonia: Secondary | ICD-10-CM | POA: Diagnosis not present

## 2020-02-14 DIAGNOSIS — R5381 Other malaise: Secondary | ICD-10-CM | POA: Diagnosis not present

## 2020-02-14 DIAGNOSIS — D62 Acute posthemorrhagic anemia: Secondary | ICD-10-CM | POA: Diagnosis not present

## 2020-02-14 DIAGNOSIS — L89154 Pressure ulcer of sacral region, stage 4: Secondary | ICD-10-CM | POA: Diagnosis not present

## 2020-02-14 DIAGNOSIS — J189 Pneumonia, unspecified organism: Secondary | ICD-10-CM | POA: Diagnosis not present

## 2020-02-14 DIAGNOSIS — X58XXXA Exposure to other specified factors, initial encounter: Secondary | ICD-10-CM | POA: Diagnosis not present

## 2020-02-14 DIAGNOSIS — R739 Hyperglycemia, unspecified: Secondary | ICD-10-CM | POA: Diagnosis not present

## 2020-02-14 DIAGNOSIS — E1152 Type 2 diabetes mellitus with diabetic peripheral angiopathy with gangrene: Secondary | ICD-10-CM | POA: Diagnosis not present

## 2020-02-14 DIAGNOSIS — A419 Sepsis, unspecified organism: Secondary | ICD-10-CM | POA: Diagnosis not present

## 2020-02-14 DIAGNOSIS — I132 Hypertensive heart and chronic kidney disease with heart failure and with stage 5 chronic kidney disease, or end stage renal disease: Secondary | ICD-10-CM | POA: Diagnosis present

## 2020-02-14 DIAGNOSIS — E669 Obesity, unspecified: Secondary | ICD-10-CM | POA: Diagnosis not present

## 2020-02-14 DIAGNOSIS — J1282 Pneumonia due to coronavirus disease 2019: Secondary | ICD-10-CM | POA: Diagnosis present

## 2020-02-14 DIAGNOSIS — Y95 Nosocomial condition: Secondary | ICD-10-CM | POA: Diagnosis not present

## 2020-02-14 DIAGNOSIS — Z930 Tracheostomy status: Secondary | ICD-10-CM | POA: Diagnosis not present

## 2020-02-14 DIAGNOSIS — Y832 Surgical operation with anastomosis, bypass or graft as the cause of abnormal reaction of the patient, or of later complication, without mention of misadventure at the time of the procedure: Secondary | ICD-10-CM | POA: Diagnosis not present

## 2020-02-14 DIAGNOSIS — M7989 Other specified soft tissue disorders: Secondary | ICD-10-CM | POA: Diagnosis not present

## 2020-02-14 DIAGNOSIS — R579 Shock, unspecified: Secondary | ICD-10-CM | POA: Diagnosis not present

## 2020-02-14 DIAGNOSIS — L03317 Cellulitis of buttock: Secondary | ICD-10-CM | POA: Diagnosis not present

## 2020-02-14 DIAGNOSIS — N186 End stage renal disease: Secondary | ICD-10-CM | POA: Diagnosis present

## 2020-02-14 DIAGNOSIS — R6521 Severe sepsis with septic shock: Secondary | ICD-10-CM | POA: Diagnosis not present

## 2020-02-14 DIAGNOSIS — R578 Other shock: Secondary | ICD-10-CM | POA: Diagnosis not present

## 2020-02-14 DIAGNOSIS — A0839 Other viral enteritis: Secondary | ICD-10-CM | POA: Diagnosis present

## 2020-02-14 DIAGNOSIS — Z66 Do not resuscitate: Secondary | ICD-10-CM | POA: Diagnosis not present

## 2020-02-14 DIAGNOSIS — Z9911 Dependence on respirator [ventilator] status: Secondary | ICD-10-CM | POA: Diagnosis not present

## 2020-02-14 DIAGNOSIS — Z515 Encounter for palliative care: Secondary | ICD-10-CM | POA: Diagnosis not present

## 2020-02-14 DIAGNOSIS — Y848 Other medical procedures as the cause of abnormal reaction of the patient, or of later complication, without mention of misadventure at the time of the procedure: Secondary | ICD-10-CM | POA: Diagnosis not present

## 2020-02-14 DIAGNOSIS — E874 Mixed disorder of acid-base balance: Secondary | ICD-10-CM | POA: Diagnosis not present

## 2020-02-14 LAB — LACTATE DEHYDROGENASE: LDH: 297 U/L — ABNORMAL HIGH (ref 98–192)

## 2020-02-14 LAB — CBC
HCT: 25.9 % — ABNORMAL LOW (ref 36.0–46.0)
Hemoglobin: 8.5 g/dL — ABNORMAL LOW (ref 12.0–15.0)
MCH: 27.9 pg (ref 26.0–34.0)
MCHC: 32.8 g/dL (ref 30.0–36.0)
MCV: 84.9 fL (ref 80.0–100.0)
Platelets: 190 10*3/uL (ref 150–400)
RBC: 3.05 MIL/uL — ABNORMAL LOW (ref 3.87–5.11)
RDW: 14.6 % (ref 11.5–15.5)
WBC: 8.8 10*3/uL (ref 4.0–10.5)
nRBC: 0 % (ref 0.0–0.2)

## 2020-02-14 LAB — LACTIC ACID, PLASMA
Lactic Acid, Venous: 1.4 mmol/L (ref 0.5–1.9)
Lactic Acid, Venous: 1.4 mmol/L (ref 0.5–1.9)

## 2020-02-14 LAB — RENAL FUNCTION PANEL
Albumin: 2.7 g/dL — ABNORMAL LOW (ref 3.5–5.0)
Anion gap: 24 — ABNORMAL HIGH (ref 5–15)
BUN: 102 mg/dL — ABNORMAL HIGH (ref 6–20)
CO2: 17 mmol/L — ABNORMAL LOW (ref 22–32)
Calcium: 8.6 mg/dL — ABNORMAL LOW (ref 8.9–10.3)
Chloride: 88 mmol/L — ABNORMAL LOW (ref 98–111)
Creatinine, Ser: 16.42 mg/dL — ABNORMAL HIGH (ref 0.44–1.00)
GFR calc Af Amer: 3 mL/min — ABNORMAL LOW (ref >60)
GFR calc non Af Amer: 2 mL/min — ABNORMAL LOW (ref >60)
Glucose, Bld: 662 mg/dL (ref 70–99)
Phosphorus: 9.7 mg/dL — ABNORMAL HIGH (ref 2.5–4.6)
Potassium: 5 mmol/L (ref 3.5–5.1)
Sodium: 129 mmol/L — ABNORMAL LOW (ref 135–145)

## 2020-02-14 LAB — HEMOGLOBIN A1C
Hgb A1c MFr Bld: 12.8 % — ABNORMAL HIGH (ref 4.8–5.6)
Mean Plasma Glucose: 320.66 mg/dL

## 2020-02-14 LAB — C-REACTIVE PROTEIN: CRP: 13.3 mg/dL — ABNORMAL HIGH (ref <1.0)

## 2020-02-14 LAB — HIV ANTIBODY (ROUTINE TESTING W REFLEX): HIV Screen 4th Generation wRfx: NONREACTIVE

## 2020-02-14 LAB — D-DIMER, QUANTITATIVE: D-Dimer, Quant: 3.57 ug/mL-FEU — ABNORMAL HIGH (ref 0.00–0.50)

## 2020-02-14 LAB — CBG MONITORING, ED
Glucose-Capillary: 478 mg/dL — ABNORMAL HIGH (ref 70–99)
Glucose-Capillary: 298 mg/dL — ABNORMAL HIGH (ref 70–99)

## 2020-02-14 LAB — PHOSPHORUS: Phosphorus: 9.6 mg/dL — ABNORMAL HIGH (ref 2.5–4.6)

## 2020-02-14 LAB — SARS CORONAVIRUS 2 BY RT PCR (HOSPITAL ORDER, PERFORMED IN ~~LOC~~ HOSPITAL LAB): SARS Coronavirus 2: POSITIVE — AB

## 2020-02-14 LAB — FIBRINOGEN: Fibrinogen: 716 mg/dL — ABNORMAL HIGH (ref 210–475)

## 2020-02-14 LAB — TRIGLYCERIDES: Triglycerides: 395 mg/dL — ABNORMAL HIGH (ref <150)

## 2020-02-14 LAB — GLUCOSE, CAPILLARY: Glucose-Capillary: 441 mg/dL — ABNORMAL HIGH (ref 70–99)

## 2020-02-14 LAB — PROTIME-INR
INR: 1.2 (ref 0.8–1.2)
Prothrombin Time: 14.8 seconds (ref 11.4–15.2)

## 2020-02-14 LAB — PROCALCITONIN: Procalcitonin: 10.51 ng/mL

## 2020-02-14 LAB — FERRITIN: Ferritin: 6690 ng/mL — ABNORMAL HIGH (ref 11–307)

## 2020-02-14 MED ORDER — ALBUTEROL SULFATE (2.5 MG/3ML) 0.083% IN NEBU: 3.0000 mL | INHALATION_SOLUTION | Freq: Four times a day (QID) | RESPIRATORY_TRACT | Status: DC | PRN

## 2020-02-14 MED ORDER — ENOXAPARIN SODIUM 40 MG/0.4ML ~~LOC~~ SOLN
40.0000 mg | SUBCUTANEOUS | Status: DC
  Administered 2020-02-14: 40 mg via SUBCUTANEOUS
  Filled 2020-02-14: qty 0.4

## 2020-02-14 MED ORDER — SODIUM CHLORIDE 0.9 % IV SOLN
100.0000 mg | INTRAVENOUS | Status: AC
  Administered 2020-02-14: 100 mg via INTRAVENOUS
  Filled 2020-02-14: qty 100
  Filled 2020-02-14: qty 20

## 2020-02-14 MED ORDER — SODIUM CHLORIDE 0.9 % IV SOLN: 200.0000 mg | Freq: Once | INTRAVENOUS | Status: DC

## 2020-02-14 MED ORDER — DARBEPOETIN ALFA 100 MCG/0.5ML IJ SOSY
PREFILLED_SYRINGE | INTRAMUSCULAR | Status: AC
  Administered 2020-02-14: 100 ug via INTRAVENOUS
  Filled 2020-02-14: qty 0.5

## 2020-02-14 MED ORDER — INSULIN ASPART 100 UNIT/ML ~~LOC~~ SOLN
0.0000 [IU] | Freq: Three times a day (TID) | SUBCUTANEOUS | Status: DC
  Administered 2020-02-14: 9 [IU] via SUBCUTANEOUS
  Administered 2020-02-14 – 2020-02-15 (×3): 5 [IU] via SUBCUTANEOUS
  Administered 2020-02-15: 9 [IU] via SUBCUTANEOUS

## 2020-02-14 MED ORDER — SODIUM CHLORIDE 0.9 % IV SOLN: 100.0000 mg | Freq: Every day | INTRAVENOUS | Status: DC

## 2020-02-14 MED ORDER — LEVOTHYROXINE SODIUM 25 MCG PO TABS
125.0000 ug | ORAL_TABLET | Freq: Every day | ORAL | Status: DC
  Administered 2020-02-15 – 2020-02-24 (×10): 125 ug via ORAL
  Filled 2020-02-14 (×10): qty 1

## 2020-02-14 MED ORDER — PREGABALIN 75 MG PO CAPS
75.0000 mg | ORAL_CAPSULE | Freq: Every day | ORAL | Status: DC
  Administered 2020-02-14 – 2020-02-24 (×11): 75 mg via ORAL
  Filled 2020-02-14 (×8): qty 1
  Filled 2020-02-14 (×2): qty 3
  Filled 2020-02-14: qty 1

## 2020-02-14 MED ORDER — ACETAMINOPHEN 325 MG PO TABS
650.0000 mg | ORAL_TABLET | Freq: Four times a day (QID) | ORAL | Status: DC | PRN
  Administered 2020-02-14 – 2020-02-23 (×4): 650 mg via ORAL
  Filled 2020-02-14 (×4): qty 2

## 2020-02-14 MED ORDER — ACETAMINOPHEN 650 MG RE SUPP: 650.0000 mg | Freq: Four times a day (QID) | RECTAL | Status: DC | PRN

## 2020-02-14 MED ORDER — ENOXAPARIN SODIUM 40 MG/0.4ML ~~LOC~~ SOLN: 40.0000 mg | SUBCUTANEOUS | Status: DC

## 2020-02-14 MED ORDER — CALCIUM ACETATE (PHOS BINDER) 667 MG PO CAPS: 1334.0000 mg | ORAL_CAPSULE | Freq: Three times a day (TID) | ORAL | Status: DC

## 2020-02-14 MED ORDER — SODIUM CHLORIDE 0.9 % IV SOLN
100.0000 mg | Freq: Every day | INTRAVENOUS | Status: AC
  Administered 2020-02-14 – 2020-02-17 (×4): 100 mg via INTRAVENOUS
  Filled 2020-02-14 (×3): qty 20

## 2020-02-14 MED ORDER — FERRIC CITRATE 1 GM 210 MG(FE) PO TABS
210.0000 mg | ORAL_TABLET | Freq: Three times a day (TID) | ORAL | Status: DC
  Filled 2020-02-14: qty 1

## 2020-02-14 MED ORDER — PANTOPRAZOLE SODIUM 40 MG PO TBEC
80.0000 mg | DELAYED_RELEASE_TABLET | Freq: Every day | ORAL | Status: DC
  Administered 2020-02-14 – 2020-02-24 (×11): 80 mg via ORAL
  Filled 2020-02-14 (×12): qty 2

## 2020-02-14 MED ORDER — UMECLIDINIUM BROMIDE 62.5 MCG/INH IN AEPB
1.0000 | INHALATION_SPRAY | Freq: Every day | RESPIRATORY_TRACT | Status: DC
  Administered 2020-02-14 – 2020-02-24 (×10): 1 via RESPIRATORY_TRACT
  Filled 2020-02-14 (×3): qty 7

## 2020-02-14 MED ORDER — ALBUTEROL SULFATE HFA 108 (90 BASE) MCG/ACT IN AERS
2.0000 | INHALATION_SPRAY | RESPIRATORY_TRACT | Status: DC | PRN
  Administered 2020-02-14: 2 via RESPIRATORY_TRACT
  Filled 2020-02-14: qty 6.7

## 2020-02-14 MED ORDER — GUAIFENESIN-DM 100-10 MG/5ML PO SYRP
10.0000 mL | ORAL_SOLUTION | ORAL | Status: DC | PRN
  Administered 2020-02-14 – 2020-02-24 (×12): 10 mL via ORAL
  Filled 2020-02-14 (×13): qty 10

## 2020-02-14 MED ORDER — DEXAMETHASONE SODIUM PHOSPHATE 10 MG/ML IJ SOLN
6.0000 mg | Freq: Every day | INTRAMUSCULAR | Status: AC
  Administered 2020-02-14 – 2020-02-23 (×10): 6 mg via INTRAVENOUS
  Filled 2020-02-14 (×10): qty 1

## 2020-02-14 MED ORDER — FERRIC CITRATE 1 GM 210 MG(FE) PO TABS
630.0000 mg | ORAL_TABLET | Freq: Three times a day (TID) | ORAL | Status: DC
  Administered 2020-02-14 – 2020-02-15 (×2): 630 mg via ORAL
  Filled 2020-02-14 (×5): qty 3

## 2020-02-14 MED ORDER — CHLORHEXIDINE GLUCONATE CLOTH 2 % EX PADS
6.0000 | MEDICATED_PAD | Freq: Every day | CUTANEOUS | Status: DC
  Administered 2020-02-14 – 2020-02-29 (×15): 6 via TOPICAL

## 2020-02-14 MED ORDER — ENOXAPARIN SODIUM 30 MG/0.3ML ~~LOC~~ SOLN
30.0000 mg | SUBCUTANEOUS | Status: DC
  Administered 2020-02-15: 30 mg via SUBCUTANEOUS
  Filled 2020-02-14: qty 0.3

## 2020-02-14 MED ORDER — INSULIN ASPART 100 UNIT/ML ~~LOC~~ SOLN
10.0000 [IU] | Freq: Once | SUBCUTANEOUS | Status: AC
  Administered 2020-02-14: 10 [IU] via SUBCUTANEOUS

## 2020-02-14 MED ORDER — CINACALCET HCL 30 MG PO TABS
60.0000 mg | ORAL_TABLET | ORAL | Status: DC
  Administered 2020-02-14 – 2020-02-24 (×6): 60 mg via ORAL
  Filled 2020-02-14 (×8): qty 2

## 2020-02-14 MED ORDER — DARBEPOETIN ALFA 100 MCG/0.5ML IJ SOSY
100.0000 ug | PREFILLED_SYRINGE | INTRAMUSCULAR | Status: DC
  Administered 2020-02-23: 100 ug via INTRAVENOUS
  Filled 2020-02-14 (×3): qty 0.5

## 2020-02-14 MED ORDER — TIOTROPIUM BROMIDE MONOHYDRATE 18 MCG IN CAPS: 18.0000 ug | ORAL_CAPSULE | Freq: Every day | RESPIRATORY_TRACT | Status: DC

## 2020-02-14 MED ORDER — METOPROLOL SUCCINATE ER 50 MG PO TB24
50.0000 mg | ORAL_TABLET | Freq: Two times a day (BID) | ORAL | Status: DC
  Administered 2020-02-14 – 2020-02-17 (×4): 50 mg via ORAL
  Filled 2020-02-14: qty 2
  Filled 2020-02-14 (×7): qty 1

## 2020-02-14 MED ORDER — AMLODIPINE BESYLATE 10 MG PO TABS
10.0000 mg | ORAL_TABLET | Freq: Every day | ORAL | Status: DC
  Administered 2020-02-15: 10 mg via ORAL
  Filled 2020-02-14 (×2): qty 1

## 2020-02-14 MED ORDER — PHENYTOIN SODIUM EXTENDED 100 MG PO CAPS
100.0000 mg | ORAL_CAPSULE | Freq: Three times a day (TID) | ORAL | Status: DC
  Filled 2020-02-14: qty 1

## 2020-02-14 NOTE — H&P (Signed)
Date: 02/14/2020               Patient Name:  Wanda Ramirez MRN: [id number]  DOB: [birth year] Age / Sex: 49 y.o., female   PCP: Welford Roche, NP         Medical Service: Internal Medicine Teaching Service         Attending Physician: Dr. Evette Doffing, Mallie Mussel, *    First Contact: Dr. Laural Golden  Pager: [phone]            After Hours (After 5p/  First Contact Pager: [phone]  weekends / holidays): Second Contact Pager: [phone]   Chief Complaint: Worsening shortness of breath  History of Present Illness: This is a 49 year old female with a history of ESRD on HD MWF, diabetes, hypertension, hypothyroidism, hyperlipidemia, and asthma on 3 L nasal cannula presenting with worsening shortness of breath and difficulty doing her daily activities.  Diagnosed with COVID on Thursday and was not dialyzed on Monday.  Patient reports that on Thursday she started having symptoms of worsening shortness of breath, dry cough, generalized body aches, and loose stools.  She was diagnosed with COVID at that time, completed her dialysis session that week.  Over the past few days she states that her symptoms have been worsening, feeling more short of breath and having more difficulty with ambulation secondary to dyspnea she then developed fever on Monday and decided to come in to be seen.  She states that her food intake has been some days she does not eat as much. Reports having looser stools, up to 3 bowel movements per day. Also endorses nausea, no vomiting. She denies any recent travel or recent sick contacts, her daughter did get Covid however states that she has not been around her much.  She has been vaccinated against Covid, received the Moderna vaccine in March or April.  In the ED patient was noted to be febrile to 102.9, respiratory rate 15-20, heart rates 70 to 80s, blood pressure stable.  Placed on 4 L nasal cannula. CMP showed sodium 130 potassium 3.8, glucose 454, anion gap 21.  Lactic acid 1.8.   WBC 10, hemoglobin 8.8, platelets 205.  Elevated at 3.57, appropriate.  Chest x-ray showed bilateral patchy airspace opacities consistent with Covid.  Patient given albuterol in the ER and admitted to IMTS.  Meds:  Current Meds  Medication Sig  . acetaminophen (TYLENOL) 325 MG tablet Take 650 mg by mouth every 6 (six) hours as needed for mild pain.  Marland Kitchen albuterol (PROVENTIL HFA;VENTOLIN HFA) 108 (90 Base) MCG/ACT inhaler Inhale 2 puffs into the lungs every 6 (six) hours as needed for wheezing or shortness of breath.  Marland Kitchen amLODipine (NORVASC) 10 MG tablet Take 10 mg by mouth at bedtime.  Marland Kitchen aspirin EC 81 MG tablet Take 81 mg by mouth daily.  Lorin Picket 1 GM 210 MG(Fe) tablet Take 210 mg by mouth 3 (three) times daily with meals.   . benzonatate (TESSALON) 100 MG capsule Take 100 mg by mouth as needed for cough.   . Brinzolamide-Brimonidine (SIMBRINZA) 1-0.2 % SUSP Place 2 drops into both eyes 3 (three) times daily.   . calcium acetate (PHOSLO) 667 MG capsule Take 1,334 mg by mouth 3 (three) times daily.  . cinacalcet (SENSIPAR) 90 MG tablet Take 90 mg by mouth daily.   Marland Kitchen levothyroxine (SYNTHROID, LEVOTHROID) 125 MCG tablet Take 125 mcg by mouth daily before breakfast.  . loratadine (CLARITIN) 10 MG tablet Take 10  mg by mouth daily.  . metoprolol succinate (TOPROL-XL) 50 MG 24 hr tablet Take 50 mg by mouth 2 (two) times daily. Take with or immediately following a meal.   . Netarsudil Dimesylate (RHOPRESSA) 0.02 % SOLN Place 1 drop into both eyes daily.   Marland Kitchen omeprazole (PRILOSEC) 40 MG capsule Take 40 mg by mouth daily.  . ondansetron (ZOFRAN) 4 MG tablet Take 4 mg by mouth daily.  . Oxycodone HCl 10 MG TABS Take 10 mg by mouth 4 (four) times daily as needed for pain.  . phenytoin (DILANTIN) 100 MG ER capsule Take 1 capsule (100 mg total) by mouth 3 (three) times daily.  . pregabalin (LYRICA) 75 MG capsule Take 1 capsule (75 mg total) by mouth daily. Take an additional 75mg  on Dialysis days  .  tiotropium (SPIRIVA) 18 MCG inhalation capsule Place 18 mcg into inhaler and inhale daily.  . Travoprost, BAK Free, (TRAVATAN) 0.004 % SOLN ophthalmic solution Place 1 drop into both eyes every morning.    Allergies: Allergies as of 02/13/2020 - Review Complete 06/25/2018  Allergen Reaction Noted  . Hydrocodone Itching and Nausea And Vomiting 05/11/2014   Past Medical History:  Diagnosis Date  . Arthritis   . Asthma   . Chronic back pain   . Diabetes mellitus ([place])   . ESRD (end stage renal disease) on dialysis (HCC)    TTS  . Essential hypertension   . GERD (gastroesophageal reflux disease)   . Glaucoma   . Hyperlipidemia   . Hypothyroidism   . Morbid obesity ([place])   . Peripheral neuropathy   . Sleep apnea    wears CPAP, does not know setting    Family History:  Family History  Problem Relation Age of Onset  . Diabetes Mother   . Hyperlipidemia Mother   . Kidney disease Father      Social History: Denies smoking, EtOH, or drug use. Lives alone at home. On disability.   Review of Systems: A complete ROS was negative except as per HPI.   Physical Exam: Blood pressure (!) 123/53, pulse 85, temperature 99.5 F (37.5 C), resp. rate (!) 30, height 5\' 2"  (1.575 m), weight 124.7 kg, SpO2 95 %. Physical Exam Constitutional:      Comments: Mild distress, intermittent coughing  HENT:     Head: Normocephalic and atraumatic.     Nose: No congestion.  Cardiovascular:     Rate and Rhythm: Normal rate and regular rhythm.  Pulmonary:     Effort: Respiratory distress present.     Breath sounds: Rales (Dry crackles in middle lung fields) present.  Chest:     Chest wall: No tenderness.  Abdominal:     General: Abdomen is flat. Bowel sounds are normal.     Palpations: Abdomen is soft.     Tenderness: There is abdominal tenderness (Mild TTP diffusely). There is no guarding or rebound.  Musculoskeletal:        General: No swelling or tenderness. Normal range of motion.      Cervical back: Normal range of motion and neck supple.  Skin:    General: Skin is warm and dry.     Capillary Refill: Capillary refill takes less than 2 seconds.  Neurological:     General: No focal deficit present.     Mental Status: She is alert and oriented to person, place, and time.  Psychiatric:        Mood and Affect: Mood normal.  Behavior: Behavior normal.    EKG: personally reviewed my interpretation is sinus rhythm, right axis deviation, no ST or T wave abnormalities  CXR: personally reviewed my interpretation is rotated, cardiomegaly, diffuse, bilateral, patch opacities.   Assessment & Plan by Problem: Active Problems:   COVID-19   Acute hypoxic respiratory failure 2/2 COVID-19 positive test (U07.1, COVID-19) with Acute Pneumonia (J12.89, Other viral pneumonia) This is a 49 year old female with a history of ESRD on HD T/Th/S (pt noted MWF dialysis), diabetes, hypertension, hypothyroidism, hyperlipidemia, and asthma on 3 L nasal cannula recently diagnosed with COVID on 9/10 presenting with worsening shortness of breath, fever, loose stools, and generalized body aches. With increasing O2 requirements (4L). CXR consistent with COVID pneumonia. Inflammatory labs thus far elevated with D-dimer 3.57. LDH 297. Fibrinogen 716. Patient does not have tachycardia and given the progression of the SOB a PE seems less likely at this time however could consider CTA if symptoms worsen. Does not appear volume overloaded at this time.   -Remdesivir per pharmacy -Decadron day 1 -Continue supplemental oxygen as needed, wean as tolerated -Monitor inflammatory markers -Trend fever curve -Incentive spirometer and flutter valve -Airborne/contact precautions -Consider Baricitinib if respiratory status declines  Asthma: Patient is on albuterol as needed and Spiriva daily.  On chronic 3 L nasal cannula at home.  Patient received a breathing treatment in the ER.  Patient is at high risk from  the Covid infection due to her asthma.  Continue albuterol as needed and start Incruse daily(on formulary here)  Hypertension: Patient is on amlodipine 10 mg daily and metoprolol 50 mg twice daily.  Blood pressure here normotensive around 125/53.  Will resume home medications. -Resume home amlodipine 10 mg daily and metoprolol 50 mg twice daily  ESRD on HD MWF: Patient missed HD on Monday.  She is noted to be on Sensipar PhosLo, she does not appear fluid overloaded at this time. Electrolytes are stable in creatinine showed stable chronic CKD.  -Consult nephrology -Monitor electrolytes -Resumed home calcium and auryxia  Diabetes Patient is on SSI humalog at home, 5-12 units. CBGs elevated today around 400. Starting on steroids as above however patient also has ESRD. Will start with SSI.   -SSI-moderate -Frequent CBGs  Hypothyroidism -Continue home synthroid 125 mcg daily.  GERD On omeprazole 40 mg daily at home.  -Protonix 80 mg daily.   OSA: Wears CPAP at night, resumed this.   Dispo: Admit patient to Inpatient with expected length of stay greater than 2 midnights.  Signed: Asencion Noble, MD 02/14/2020, 10:00 AM  Pager: [phone] After 5pm on weekdays and 1pm on weekends: On Call pager: [phone]

## 2020-02-14 NOTE — Progress Notes (Signed)
Pt does not wear CPAP at home.

## 2020-02-14 NOTE — ED Notes (Signed)
Pt assisted to [facility] to have a BM

## 2020-02-14 NOTE — Consult Note (Signed)
ESRD Consult Note  Requesting provider: [facility] requesting consult: IMTS Reason for consult: ESRD, provision of dialysis Indication for acute dialysis?: End Stage Renal Disease  Outpatient dialysis unit: [facility] dialysis schedule: TTS  Assessment/Recommendations: Wanda Ramirez is a/an 49 y.o. female with a past medical history notable for ESRD on HD admitted with acute on chronic hypoxic respiratory failure 2/2 covid.   # ESRD: TTS, BFR 450, DFR 800, k 2, na 137, ca 2.25, bicarb 35, f 180. Will perform HD today. Holding heparin 2000 units with each treatment # Volume/ hypertension: EDW 111.3kg. Attempt to achieve EDW as tolerated and may challenge to help with hypoxia # Anemia of Chronic Kidney Disease: Hemoglobin 8.8. Currently receiving aranesp 71mcg weekly will continue here at higher dose of 134mcg qtuesday # Secondary Hyperparathyroidism/Hyperphosphatemia: add on phos. Continue auryxia 3 tablets tid w/ meals and sensipar 60mg  every other day per outpatient orders # Vascular access: AVF with no issues # Hospital problem: COVID infection/Acute on chronic hypoxic respiratory failure: increased o2 requirement and dyspnea. Primary team managing. Fully vaccinated  # Additional recommendations: - Dose all meds for creatinine clearance < 10 ml/min  - Unless absolutely necessary, no MRIs with gadolinium.  - Implement save arm precautions.  Prefer needle sticks in the dorsum of the hands or wrists.  No blood pressure measurements in arm. - If blood transfusion is requested during hemodialysis sessions, please alert [place] prior to the session.   Recommendations were discussed with the primary team.   History of Present Illness: Wanda Ramirez is a/an 49 y.o. female with a past medical history of ESRD who presents with dyspnea and fever.  Patient noted on Thursday but this past week that she was having worsening shortness of breath, dry cough, diarrhea.  She  underwent Covid testing and was positive.  She was able to continue with dialysis and last had dialysis on Saturday.  However, her symptoms seem to be worsening and she is feeling more shortness of breath.  She also has worsening dyspnea on exertion.  On Monday she was having fevers and decided to come be evaluated in the emergency department.  She notes that her p.o. intake has been less and has had some nausea.  She denies any emesis.  She has chronic respiratory failure for which she uses 3 L nasal cannula at home.  On arrival to the emergency department she was noted to be febrile and hypoxic requiring 4 L nasal cannula.  Labs demonstrated sodium 130 with bicarb of 20 and anion gap of 21.  She feels like she is at her baseline weight but has had challenges to her dry weight outpatient with no cramps.  She is being admitted for further management of her worsening dyspnea and hypoxia.   Medications:  Current Facility-Administered Medications  Medication Dose Route Frequency Provider Last Rate Last Admin  . acetaminophen (TYLENOL) tablet 650 mg  650 mg Oral Q6H PRN Asencion Noble, MD       Or  . acetaminophen (TYLENOL) suppository 650 mg  650 mg Rectal Q6H PRN Asencion Noble, MD      . acetaminophen (TYLENOL) tablet 650 mg  650 mg Oral Once Sherry Ruffing, Marissa M, MD      . albuterol (VENTOLIN HFA) 108 (90 Base) MCG/ACT inhaler 2 puff  2 puff Inhalation Q4H PRN Asencion Noble, MD   2 puff at 02/14/20 0900  . amLODipine (NORVASC) tablet 10 mg  10 mg Oral QHS Lonia Skinner  M, MD      . calcium acetate (PHOSLO) capsule 1,334 mg  1,334 mg Oral TID with meals Asencion Noble, MD      . dexamethasone (DECADRON) injection 6 mg  6 mg Intravenous Daily Sherry Ruffing, Marissa M, MD      . enoxaparin (LOVENOX) injection 40 mg  40 mg Subcutaneous Q24H Sherry Ruffing, Marissa M, MD      . ferric citrate (AURYXIA) tablet 210 mg  210 mg Oral TID WC Asencion Noble, MD      . guaiFENesin-dextromethorphan  (ROBITUSSIN DM) 100-10 MG/5ML syrup 10 mL  10 mL Oral Q4H PRN Asencion Noble, MD      . insulin aspart (novoLOG) injection 0-9 Units  0-9 Units Subcutaneous TID WC Asencion Noble, MD      . Derrill Memo ON 02/15/2020] levothyroxine (SYNTHROID) tablet 125 mcg  125 mcg Oral QAC breakfast Asencion Noble, MD      . metoprolol succinate (TOPROL-XL) 24 hr tablet 50 mg  50 mg Oral BID Sherry Ruffing, Marissa M, MD      . pantoprazole (PROTONIX) EC tablet 80 mg  80 mg Oral Daily Lonia Skinner M, MD      . pregabalin (LYRICA) capsule 75 mg  75 mg Oral Daily Lonia Skinner M, MD      . remdesivir 100 mg in sodium chloride 0.9 % 100 mL IVPB  100 mg Intravenous Q30 min Rumbarger, Valeda Malm, RPH      . [START ON 02/15/2020] remdesivir 100 mg in sodium chloride 0.9 % 100 mL IVPB  100 mg Intravenous Daily Rumbarger, Rachel L, RPH      . umeclidinium bromide (INCRUSE ELLIPTA) 62.5 MCG/INH 1 puff  1 puff Inhalation Daily Axel Filler, MD       Current Outpatient Medications  Medication Sig Dispense Refill  . acetaminophen (TYLENOL) 325 MG tablet Take 650 mg by mouth every 6 (six) hours as needed for mild pain.    Marland Kitchen albuterol (PROVENTIL HFA;VENTOLIN HFA) 108 (90 Base) MCG/ACT inhaler Inhale 2 puffs into the lungs every 6 (six) hours as needed for wheezing or shortness of breath.    Marland Kitchen amLODipine (NORVASC) 10 MG tablet Take 10 mg by mouth at bedtime.    Marland Kitchen aspirin EC 81 MG tablet Take 81 mg by mouth daily.    Lorin Picket 1 GM 210 MG(Fe) tablet Take 210 mg by mouth 3 (three) times daily with meals.   3  . benzonatate (TESSALON) 100 MG capsule Take 100 mg by mouth as needed for cough.     . Brinzolamide-Brimonidine (SIMBRINZA) 1-0.2 % SUSP Place 2 drops into both eyes 3 (three) times daily.     . calcium acetate (PHOSLO) 667 MG capsule Take 1,334 mg by mouth 3 (three) times daily.  6  . cinacalcet (SENSIPAR) 90 MG tablet Take 90 mg by mouth daily.     Marland Kitchen levothyroxine (SYNTHROID, LEVOTHROID) 125 MCG tablet  Take 125 mcg by mouth daily before breakfast.    . loratadine (CLARITIN) 10 MG tablet Take 10 mg by mouth daily.    . metoprolol succinate (TOPROL-XL) 50 MG 24 hr tablet Take 50 mg by mouth 2 (two) times daily. Take with or immediately following a meal.     . Netarsudil Dimesylate (RHOPRESSA) 0.02 % SOLN Place 1 drop into both eyes daily.     Marland Kitchen omeprazole (PRILOSEC) 40 MG capsule Take 40 mg by mouth daily.    . ondansetron (ZOFRAN) 4 MG tablet Take  4 mg by mouth daily.    . Oxycodone HCl 10 MG TABS Take 10 mg by mouth 4 (four) times daily as needed for pain.  0  . phenytoin (DILANTIN) 100 MG ER capsule Take 1 capsule (100 mg total) by mouth 3 (three) times daily. 90 capsule 0  . pregabalin (LYRICA) 75 MG capsule Take 1 capsule (75 mg total) by mouth daily. Take an additional 75mg  on Dialysis days    . tiotropium (SPIRIVA) 18 MCG inhalation capsule Place 18 mcg into inhaler and inhale daily.    . Travoprost, BAK Free, (TRAVATAN) 0.004 % SOLN ophthalmic solution Place 1 drop into both eyes every morning.    . famotidine (PEPCID) 40 MG tablet Take 40 mg by mouth daily.    . insulin lispro (HUMALOG) 100 UNIT/ML injection Inject 5-12 Units into the skin See admin instructions. Per patient - Insulin is used per sliding scale. If blood glucose is greater than 100, patient is to use 10 - 12 units. If less than 100, patient is to use 5 - 10 units.       ALLERGIES Hydrocodone  MEDICAL HISTORY Past Medical History:  Diagnosis Date  . Arthritis   . Asthma   . Chronic back pain   . Diabetes mellitus ([place])   . ESRD (end stage renal disease) on dialysis (HCC)    TTS  . Essential hypertension   . GERD (gastroesophageal reflux disease)   . Glaucoma   . Hyperlipidemia   . Hypothyroidism   . Morbid obesity ([place])   . Peripheral neuropathy   . Sleep apnea    wears CPAP, does not know setting     SOCIAL HISTORY Social History   Socioeconomic History  . Marital status: Single    Spouse name: Not  on file  . Number of children: Not on file  . Years of education: Not on file  . Highest education level: Not on file  Occupational History  . Occupation: disabled  Tobacco Use  . Smoking status: Former Research scientist (life sciences)  . Smokeless tobacco: Never Used  . Tobacco comment: Smoked x 2 yrs  Vaping Use  . Vaping Use: Never used  Substance and Sexual Activity  . Alcohol use: No    Comment: years ago - has since quit  . Drug use: No  . Sexual activity: Not on file  Other Topics Concern  . Not on file  Social History Narrative   Pt lives alone in 2 story home (has 2 aides that occasionally stay the night)   Has 4 children   11th grade education   Has never been gainfully employed   Social Determinants of Radio broadcast assistant Strain:   . Difficulty of Paying Living Expenses: Not on file  Food Insecurity:   . Worried About Charity fundraiser in the Last Year: Not on file  . Ran Out of Food in the Last Year: Not on file  Transportation Needs:   . Lack of Transportation (Medical): Not on file  . Lack of Transportation (Non-Medical): Not on file  Physical Activity:   . Days of Exercise per Week: Not on file  . Minutes of Exercise per Session: Not on file  Stress:   . Feeling of Stress : Not on file  Social Connections:   . Frequency of Communication with Friends and Family: Not on file  . Frequency of Social Gatherings with Friends and Family: Not on file  . Attends Religious Services: Not on file  .  Active Member of Clubs or Organizations: Not on file  . Attends Archivist Meetings: Not on file  . Marital Status: Not on file  Intimate Partner Violence:   . Fear of Current or Ex-Partner: Not on file  . Emotionally Abused: Not on file  . Physically Abused: Not on file  . Sexually Abused: Not on file     FAMILY HISTORY Family History  Problem Relation Age of Onset  . Diabetes Mother   . Hyperlipidemia Mother   . Kidney disease Father      Review of Systems: 12  systems were reviewed and negative except per HPI  Physical Exam: Vitals:   02/14/20 0628 02/14/20 0900  BP: 135/86 (!) 123/53  Pulse: 71 85  Resp: 18 (!) 30  Temp: 99.5 F (37.5 C)   SpO2: 98% 95%   No intake/output data recorded. No intake or output data in the 24 hours ending 02/14/20 1039 General: Ill-appearing, obese, lying in bed HEENT: anicteric sclera, MMM CV: normal rate, no murmurs, trace pitting edema in the lower extremities bilaterally Lungs: bilateral chest rise, increased work of breathing Abd: soft, non-tender, non-distended Skin: no visible lesions or rashes Psych: alert, engaged, appropriate mood and affect Neuro: normal speech, no gross focal deficits   Test Results Reviewed Lab Results  Component Value Date   NA 130 (L) 02/13/2020   K 3.8 02/13/2020   CL 89 (L) 02/13/2020   CO2 20 (L) 02/13/2020   BUN 77 (H) 02/13/2020   CREATININE 13.55 (H) 02/13/2020   CALCIUM 9.1 02/13/2020   ALBUMIN 2.9 (L) 02/13/2020    I have reviewed relevant outside healthcare records

## 2020-02-14 NOTE — ED Notes (Signed)
Pt given incentive spirometer and instructed on use, pt verbalized understanding

## 2020-02-14 NOTE — Progress Notes (Signed)
Critical glucose 662 called via lab. Pt currently being dialyzed in covid room 5c. Primary HD nurse Andria Meuse made aware. Pt. Stable per HD nurse.

## 2020-02-14 NOTE — ED Notes (Signed)
Breakfast tray ordered 

## 2020-02-14 NOTE — ED Notes (Signed)
Meal delivered to PT, pt sat on edge of bed meal tray on bedside table in reach. Call bell in reach

## 2020-02-14 NOTE — Hospital Course (Addendum)
Chronic hypoxic respiratory failure 2/2 Covid and MSSA pna s/p trach  ERSD on midodrine  Sacral wound infection s/p debridement s/p 7d zosyn -NGTD on BC --consider talking to gen surgery to see if it needs additional debridement  Leukocytosis. Likely 2/2 critical illness -18.6>  AoC Anemia s/p 15U PRBC transfusion -9>8.3>8.6>8.2  High risk malnutrition. Will need PEG when her white count improves  Tachycardia. Unclear etiology but likely multifactorial involving her wound and critical/chronic illness.  T2DM. At goal  Delerium of critical illness. On seroquel, klonopin, oxy. Fentynl for breakthrough

## 2020-02-14 NOTE — ED Notes (Signed)
Pt transported to dialysis

## 2020-02-14 NOTE — ED Provider Notes (Signed)
[facility] EMERGENCY DEPARTMENT Provider Note   CSN: [id number] Arrival date & time: 02/13/20  1844     History Chief Complaint  Patient presents with   Fever    Wanda Ramirez is a 48 y.o. female.  HPI Patient presents with Covid infection fever.  Came from dialysis center today.  Reportedly tested positive for Covid on Thursday.  On chronic oxygen due to asthma.  States she is more short of breath and having difficulty doing daily activities.  She is a Monday Wednesday Friday dialysis patient.  Was not dialyzed on Monday.  Today is Tuesday but she waited around 13 and half hours to be seen by me.  States she is doing well fluid volume wise.  Has had a cough with some mild sputum production.  History of CPAP use also.    Past Medical History:  Diagnosis Date   Arthritis    Asthma    Chronic back pain    Diabetes mellitus ([place])    ESRD (end stage renal disease) on dialysis ([place])    TTS   Essential hypertension    GERD (gastroesophageal reflux disease)    Glaucoma    Hyperlipidemia    Hypothyroidism    Morbid obesity ([place])    Peripheral neuropathy    Sleep apnea    wears CPAP, does not know setting    Patient Active Problem List   Diagnosis Date Noted   Weakness 03/17/2016   Hypotension 03/17/2016   Leukocytosis 03/17/2016   Hyperlipidemia 03/17/2016   Peripheral neuropathy 03/17/2016   Precordial chest pain 03/17/2016   ESRD on dialysis ([place]) 03/16/2016   Diabetes mellitus type 2 in obese ([place]) 03/16/2016   Essential hypertension 03/16/2016   Hypothyroidism 03/16/2016   Glaucoma 03/16/2016   GERD (gastroesophageal reflux disease) 03/16/2016   OSA (obstructive sleep apnea) 03/16/2016    Past Surgical History:  Procedure Laterality Date   AV FISTULA PLACEMENT Left 04/2014   CARDIAC CATHETERIZATION N/A 03/17/2016   Procedure: Left Heart Cath and Coronary Angiography;  Surgeon: [name], MD;   Location: [facility];  Service: Cardiovascular;  Laterality: N/A;   KNEE SURGERY     TUBAL LIGATION       OB History   No obstetric history on file.     Family History  Problem Relation Age of Onset   Diabetes Mother    Hyperlipidemia Mother    Kidney disease Father     Social History   Tobacco Use   Smoking status: Former Smoker   Smokeless tobacco: Never Used   Tobacco comment: Smoked x 2 yrs  Vaping Use   Vaping Use: Never used  Substance Use Topics   Alcohol use: No    Comment: years ago - has since quit   Drug use: No    Home Medications Prior to Admission medications   Medication Sig Start Date End Date Taking? Authorizing Provider  acetaminophen (TYLENOL) 325 MG tablet Take 650 mg by mouth every 6 (six) hours as needed for mild pain.   Yes [provider]  albuterol (PROVENTIL HFA;VENTOLIN HFA) 108 (90 Base) MCG/ACT inhaler Inhale 2 puffs into the lungs every 6 (six) hours as needed for wheezing or shortness of breath.   Yes [provider]  amLODipine (NORVASC) 10 MG tablet Take 10 mg by mouth at bedtime.   Yes [provider]  aspirin EC 81 MG tablet Take 81 mg by mouth daily.   Yes [provider]  AURYXIA 1 GM 210 MG(Fe) tablet Take 210 mg by mouth 3 (three) times daily with meals.  12/18/17  Yes [provider]  benzonatate (TESSALON) 100 MG capsule Take 100 mg by mouth as needed for cough.    Yes [provider]  Brinzolamide-Brimonidine (SIMBRINZA) 1-0.2 % SUSP Place 2 drops into both eyes 3 (three) times daily.    Yes [provider]  calcium acetate (PHOSLO) 667 MG capsule Take 1,334 mg by mouth 3 (three) times daily. 10/26/17  Yes [provider]  cinacalcet (SENSIPAR) 90 MG tablet Take 90 mg by mouth daily.    Yes [provider]  levothyroxine (SYNTHROID, LEVOTHROID) 125 MCG tablet Take 125 mcg by mouth daily before breakfast.   Yes [provider]   loratadine (CLARITIN) 10 MG tablet Take 10 mg by mouth daily.   Yes [provider]  metoprolol succinate (TOPROL-XL) 50 MG 24 hr tablet Take 50 mg by mouth 2 (two) times daily. Take with or immediately following a meal.    Yes [provider]  Netarsudil Dimesylate (RHOPRESSA) 0.02 % SOLN Place 1 drop into both eyes daily.    Yes [provider]  omeprazole (PRILOSEC) 40 MG capsule Take 40 mg by mouth daily.   Yes [provider]  ondansetron (ZOFRAN) 4 MG tablet Take 4 mg by mouth daily.   Yes [provider]  Oxycodone HCl 10 MG TABS Take 10 mg by mouth 4 (four) times daily as needed for pain. 01/01/16  Yes [provider]  phenytoin (DILANTIN) 100 MG ER capsule Take 1 capsule (100 mg total) by mouth 3 (three) times daily. 11/10/17  Yes Kirichenko, Tatyana, PA-C  pregabalin (LYRICA) 75 MG capsule Take 1 capsule (75 mg total) by mouth daily. Take an additional 75mg  on Dialysis days 02/09/18  Yes Hongalgi, Lenis Dickinson, MD  tiotropium (SPIRIVA) 18 MCG inhalation capsule Place 18 mcg into inhaler and inhale daily.   Yes [provider]  Travoprost, BAK Free, (TRAVATAN) 0.004 % SOLN ophthalmic solution Place 1 drop into both eyes every morning.   Yes [provider]  insulin lispro (HUMALOG) 100 UNIT/ML injection Inject 5-12 Units into the skin See admin instructions. Per patient - Insulin is used per sliding scale. If blood glucose is greater than 100, patient is to use 10 - 12 units. If less than 100, patient is to use 5 - 10 units.    [provider]    Allergies    Hydrocodone  Review of Systems   Review of Systems  Constitutional: Positive for fatigue and fever. Negative for appetite change.  Respiratory: Positive for cough and shortness of breath.   Cardiovascular: Negative for chest pain.  Gastrointestinal: Negative for abdominal pain and diarrhea.  Musculoskeletal: Negative for back pain.  Skin: Negative for rash.   Neurological: Positive for weakness.    Physical Exam Updated Vital Signs BP 135/86    Pulse 71    Temp 99.5 F (37.5 C)    Resp 18    Ht 5\' 2"  (1.575 m)    Wt 124.7 kg    SpO2 98%    BMI 50.28 kg/m   Physical Exam Vitals and nursing note reviewed.  HENT:     Head: Normocephalic.  Eyes:     Pupils: Pupils are equal, round, and reactive to light.  Pulmonary:     Comments: Mildly harsh diffuse breath sounds.  Tachypnea.  Some difficulty completing sentences.  Frequent coughs. Abdominal:  Tenderness: There is no abdominal tenderness.  Musculoskeletal:     Cervical back: Neck supple.     Comments: Dialysis access left upper arm.  Skin:    General: Skin is warm.     Capillary Refill: Capillary refill takes less than 2 seconds.  Neurological:     Mental Status: She is alert and oriented to person, place, and time.     ED Results / Procedures / Treatments   Labs (all labs ordered are listed, but only abnormal results are displayed) Labs Reviewed  COMPREHENSIVE METABOLIC PANEL - Abnormal; Notable for the following components:      Result Value   Sodium 130 (*)    Chloride 89 (*)    CO2 20 (*)    Glucose, Bld 454 (*)    BUN 77 (*)    Creatinine, Ser 13.55 (*)    Albumin 2.9 (*)    GFR calc non Af Amer 3 (*)    GFR calc Af Amer 3 (*)    Anion gap 21 (*)    All other components within normal limits  CBC WITH DIFFERENTIAL/PLATELET - Abnormal; Notable for the following components:   RBC 3.12 (*)    Hemoglobin 8.8 (*)    HCT 26.4 (*)    Neutro Abs 9.1 (*)    Lymphs Abs 0.5 (*)    All other components within normal limits  CULTURE, BLOOD (ROUTINE X 2)  CULTURE, BLOOD (ROUTINE X 2)  SARS CORONAVIRUS 2 BY RT PCR (HOSPITAL ORDER, [place] [facility])  LACTIC ACID, PLASMA  PROTIME-INR  LACTIC ACID, PLASMA  LACTIC ACID, PLASMA  D-DIMER, QUANTITATIVE (NOT AT [facility])  PROCALCITONIN  LACTATE DEHYDROGENASE  FERRITIN  TRIGLYCERIDES  FIBRINOGEN   C-REACTIVE PROTEIN  I-STAT BETA HCG BLOOD, ED ([facility], [facility], [facility] ONLY)    EKG None  Radiology DG Chest Portable 1 View  Result Date: 02/13/2020 CLINICAL DATA:  Shortness of breath presents from dialysis center. Positive COVID on Thursday. Fever up to 103.4. EXAM: PORTABLE CHEST 1 VIEW COMPARISON:  Chest x-ray 07/13/2018. FINDINGS: The heart size and mediastinal contours are unchanged. Low lung volumes with extensive patchy airspace opacities bilaterally involving the majority of the lung parenchyma. No pulmonary edema. No pleural effusion. No pneumothorax. No acute osseous abnormality. IMPRESSION: Extensive bilateral patchy airspace opacities consistent with known COVID 19 infection. Electronically Signed   By: Iven Finn M.D.   On: 02/13/2020 22:25    Procedures Procedures (including critical care time)  Medications Ordered in ED Medications  acetaminophen (TYLENOL) tablet 650 mg (has no administration in time range)  albuterol (VENTOLIN HFA) 108 (90 Base) MCG/ACT inhaler 2 puff (2 puffs Inhalation Given 02/14/20 0900)    ED Course  I have reviewed the triage vital signs and the nursing notes.  Pertinent labs & imaging results that were available during my care of the patient were reviewed by me and considered in my medical decision making (see chart for details).    MDM Rules/Calculators/A&P                          Patient with shortness of breath and cough.  Known Covid infection with positive test on Thursday also dialysis patient and was unable to be dialyzed yesterday.  Patient is dyspneic to conversation.  Has had to increase her oxygen.  With comorbidities and increasing dyspnea feels the patient would benefit from admission to the hospital.  Will discuss with unassigned medicine.  Final Clinical Impression(s) / ED Diagnoses Final diagnoses:  COVID-19  End stage renal disease on dialysis [facility])    Rx / DC Orders ED Discharge Orders    None       Davonna Belling,  MD 02/14/20 [phone]

## 2020-02-15 LAB — CBC WITH DIFFERENTIAL/PLATELET
Abs Immature Granulocytes: 0.08 10*3/uL — ABNORMAL HIGH (ref 0.00–0.07)
Basophils Absolute: 0 10*3/uL (ref 0.0–0.1)
Basophils Relative: 0 %
Eosinophils Absolute: 0 10*3/uL (ref 0.0–0.5)
Eosinophils Relative: 0 %
HCT: 28.3 % — ABNORMAL LOW (ref 36.0–46.0)
Hemoglobin: 9.5 g/dL — ABNORMAL LOW (ref 12.0–15.0)
Immature Granulocytes: 1 %
Lymphocytes Relative: 8 %
Lymphs Abs: 1 10*3/uL (ref 0.7–4.0)
MCH: 28.4 pg (ref 26.0–34.0)
MCHC: 33.6 g/dL (ref 30.0–36.0)
MCV: 84.5 fL (ref 80.0–100.0)
Monocytes Absolute: 0.4 10*3/uL (ref 0.1–1.0)
Monocytes Relative: 3 %
Neutro Abs: 11.1 10*3/uL — ABNORMAL HIGH (ref 1.7–7.7)
Neutrophils Relative %: 88 %
Platelets: 236 10*3/uL (ref 150–400)
RBC: 3.35 MIL/uL — ABNORMAL LOW (ref 3.87–5.11)
RDW: 14.5 % (ref 11.5–15.5)
WBC: 12.6 10*3/uL — ABNORMAL HIGH (ref 4.0–10.5)
nRBC: 0 % (ref 0.0–0.2)

## 2020-02-15 LAB — COMPREHENSIVE METABOLIC PANEL
ALT: 23 U/L (ref 0–44)
AST: 59 U/L — ABNORMAL HIGH (ref 15–41)
Albumin: 2.8 g/dL — ABNORMAL LOW (ref 3.5–5.0)
Alkaline Phosphatase: 70 U/L (ref 38–126)
Anion gap: 19 — ABNORMAL HIGH (ref 5–15)
BUN: 62 mg/dL — ABNORMAL HIGH (ref 6–20)
CO2: 23 mmol/L (ref 22–32)
Calcium: 8.6 mg/dL — ABNORMAL LOW (ref 8.9–10.3)
Chloride: 91 mmol/L — ABNORMAL LOW (ref 98–111)
Creatinine, Ser: 9.33 mg/dL — ABNORMAL HIGH (ref 0.44–1.00)
GFR calc Af Amer: 5 mL/min — ABNORMAL LOW (ref >60)
GFR calc non Af Amer: 4 mL/min — ABNORMAL LOW (ref >60)
Glucose, Bld: 352 mg/dL — ABNORMAL HIGH (ref 70–99)
Potassium: 4.3 mmol/L (ref 3.5–5.1)
Sodium: 133 mmol/L — ABNORMAL LOW (ref 135–145)
Total Bilirubin: 0.6 mg/dL (ref 0.3–1.2)
Total Protein: 7.4 g/dL (ref 6.5–8.1)

## 2020-02-15 LAB — GLUCOSE, CAPILLARY
Glucose-Capillary: 466 mg/dL — ABNORMAL HIGH (ref 70–99)
Glucose-Capillary: 359 mg/dL — ABNORMAL HIGH (ref 70–99)
Glucose-Capillary: 330 mg/dL — ABNORMAL HIGH (ref 70–99)
Glucose-Capillary: 271 mg/dL — ABNORMAL HIGH (ref 70–99)
Glucose-Capillary: 299 mg/dL — ABNORMAL HIGH (ref 70–99)
Glucose-Capillary: 335 mg/dL — ABNORMAL HIGH (ref 70–99)

## 2020-02-15 LAB — FERRITIN: Ferritin: >7500 ng/mL — ABNORMAL HIGH (ref 11–307)

## 2020-02-15 LAB — D-DIMER, QUANTITATIVE: D-Dimer, Quant: 2.73 ug/mL-FEU — ABNORMAL HIGH (ref 0.00–0.50)

## 2020-02-15 LAB — C-REACTIVE PROTEIN: CRP: 17 mg/dL — ABNORMAL HIGH (ref <1.0)

## 2020-02-15 LAB — MAGNESIUM: Magnesium: 2.4 mg/dL (ref 1.7–2.4)

## 2020-02-15 LAB — PHOSPHORUS: Phosphorus: 8 mg/dL — ABNORMAL HIGH (ref 2.5–4.6)

## 2020-02-15 MED ORDER — INSULIN ASPART 100 UNIT/ML ~~LOC~~ SOLN
0.0000 [IU] | Freq: Three times a day (TID) | SUBCUTANEOUS | Status: DC
  Administered 2020-02-15: 7 [IU] via SUBCUTANEOUS
  Administered 2020-02-16: 9 [IU] via SUBCUTANEOUS

## 2020-02-15 MED ORDER — INSULIN GLARGINE 100 UNIT/ML ~~LOC~~ SOLN: 20.0000 [IU] | Freq: Every day | SUBCUTANEOUS | Status: DC

## 2020-02-15 MED ORDER — INSULIN GLARGINE 100 UNIT/ML ~~LOC~~ SOLN
20.0000 [IU] | Freq: Every day | SUBCUTANEOUS | Status: DC
  Administered 2020-02-15 – 2020-02-16 (×2): 20 [IU] via SUBCUTANEOUS
  Filled 2020-02-15 (×2): qty 0.2

## 2020-02-15 MED ORDER — INSULIN ASPART 100 UNIT/ML ~~LOC~~ SOLN
10.0000 [IU] | Freq: Three times a day (TID) | SUBCUTANEOUS | Status: DC
  Administered 2020-02-15 – 2020-02-16 (×4): 10 [IU] via SUBCUTANEOUS

## 2020-02-15 MED ORDER — HEPARIN SODIUM (PORCINE) 5000 UNIT/ML IJ SOLN
5000.0000 [IU] | Freq: Three times a day (TID) | INTRAMUSCULAR | Status: DC
  Administered 2020-02-16 – 2020-03-03 (×45): 5000 [IU] via SUBCUTANEOUS
  Filled 2020-02-15 (×46): qty 1

## 2020-02-15 MED ORDER — INSULIN GLARGINE 100 UNIT/ML ~~LOC~~ SOLN
10.0000 [IU] | Freq: Every day | SUBCUTANEOUS | Status: DC
  Administered 2020-02-15: 10 [IU] via SUBCUTANEOUS
  Filled 2020-02-15: qty 0.1

## 2020-02-15 MED ORDER — INSULIN GLARGINE 100 UNIT/ML ~~LOC~~ SOLN: 10.0000 [IU] | Freq: Every day | SUBCUTANEOUS | Status: DC

## 2020-02-15 MED ORDER — TOCILIZUMAB 400 MG/20ML IV SOLN
800.0000 mg | Freq: Once | INTRAVENOUS | Status: AC
  Administered 2020-02-15: 800 mg via INTRAVENOUS
  Filled 2020-02-15: qty 40

## 2020-02-15 MED ORDER — HEPARIN SODIUM (PORCINE) 5000 UNIT/ML IJ SOLN
5000.0000 [IU] | Freq: Three times a day (TID) | INTRAMUSCULAR | Status: DC
  Filled 2020-02-15: qty 1

## 2020-02-15 MED ORDER — INSULIN ASPART 100 UNIT/ML ~~LOC~~ SOLN
5.0000 [IU] | Freq: Three times a day (TID) | SUBCUTANEOUS | Status: DC
  Administered 2020-02-15: 5 [IU] via SUBCUTANEOUS

## 2020-02-15 MED ORDER — INSULIN ASPART 100 UNIT/ML ~~LOC~~ SOLN
15.0000 [IU] | Freq: Once | SUBCUTANEOUS | Status: AC
  Administered 2020-02-15: 15 [IU] via SUBCUTANEOUS

## 2020-02-15 NOTE — Progress Notes (Signed)
Renal Navigator contacted patient's OP HD clinic/ to ensure that they are aware of patient's COVID positive test result yesterday and request OP HD isolation shift plan at patient's discharge.  Navigator will follow up with patient once information received from clinic.  Alphonzo Cruise, [place] Renal Navigator [phone]

## 2020-02-15 NOTE — Plan of Care (Signed)

## 2020-02-15 NOTE — Progress Notes (Signed)
Nephrology Follow-Up Consult note   Assessment/Recommendations: Wanda Ramirez is a/an 49 y.o. female with a past medical history significant for ESRD on HD admitted with acute on chronic hypoxic respiratory failure 2/2 covid.   # ESRD: TTS, BFR 450, DFR 800, k 2, na 137, ca 2.25, bicarb 35, f 180.  Continue hemodialysis per schedule. Holding heparin 2000 units with each treatment # Volume/ hypertension: EDW 111.3kg.  Attempted to challenge dry weight yesterday evident by hypotension.  We will try to obtain standing weight # Anemia of Chronic Kidney Disease: Hemoglobin 9.5. Currently receiving aranesp 41mcg weekly will continue here at higher dose of 124mcg qtuesday # Secondary Hyperparathyroidism/Hyperphosphatemia:  Phosphorus 9.7 yesterday improved 8 today.  Continue auryxia 3 tablets tid w/ meals and sensipar 60mg  every other day per outpatient orders # Vascular access: AVF with no issues # Hospital problem: COVID infection/Acute on chronic hypoxic respiratory failure: increased o2 requirement and dyspnea. Primary team managing. Fully vaccinated  # Additional recommendations: - Dose all meds for creatinine clearance <10 ml/min  - Unless absolutely necessary, no MRIs with gadolinium.  - Implement save arm precautions. Prefer needle sticks in the dorsum of the hands or wrists. No blood pressure measurements in arm. - If blood transfusion is requested during hemodialysis sessions, please alert [place] prior to the session.    Recommendations conveyed to primary service.    [facility] 02/15/2020 9:57 AM  ___________________________________________________________  CC: esrd  Interval History/Subjective: I was not able to personally evaluate the patient today due to lack of PPE on the unit for Covid patient.  Case was reviewed with chart only.  Baseline 3 L nasal cannula now on 5 L nasal cannula.  Standing weight was not available yesterday tolerated 2  L removal on hemodialysis.  Has been limited by hypotension.   Medications:  Current Facility-Administered Medications  Medication Dose Route Frequency Provider Last Rate Last Admin  . acetaminophen (TYLENOL) tablet 650 mg  650 mg Oral Q6H PRN Asencion Noble, MD   650 mg at 02/14/20 2326   Or  . acetaminophen (TYLENOL) suppository 650 mg  650 mg Rectal Q6H PRN Asencion Noble, MD      . acetaminophen (TYLENOL) tablet 650 mg  650 mg Oral Once Sherry Ruffing, Marissa M, MD      . albuterol (VENTOLIN HFA) 108 (90 Base) MCG/ACT inhaler 2 puff  2 puff Inhalation Q4H PRN Asencion Noble, MD   2 puff at 02/14/20 0900  . amLODipine (NORVASC) tablet 10 mg  10 mg Oral QHS Asencion Noble, MD      . Chlorhexidine Gluconate Cloth 2 % PADS 6 each  6 each Topical Q0600 Reesa Chew, MD   6 each at 02/14/20 2319  . cinacalcet (SENSIPAR) tablet 60 mg  60 mg Oral Silvestre Gunner, MD   60 mg at 02/14/20 1758  . Darbepoetin Alfa (ARANESP) injection 100 mcg  100 mcg Intravenous Q Tue-HD Reesa Chew, MD   100 mcg at 02/14/20 1605  . dexamethasone (DECADRON) injection 6 mg  6 mg Intravenous Daily Asencion Noble, MD   6 mg at 02/15/20 8182  . enoxaparin (LOVENOX) injection 30 mg  30 mg Subcutaneous Q24H Rumbarger, Rachel L, RPH   30 mg at 02/15/20 0857  . ferric citrate (AURYXIA) tablet 630 mg  630 mg Oral TID WC Reesa Chew, MD   630 mg at 02/15/20 0856  . guaiFENesin-dextromethorphan (ROBITUSSIN DM) 100-10 MG/5ML  syrup 10 mL  10 mL Oral Q4H PRN Asencion Noble, MD   10 mL at 02/15/20 0331  . insulin aspart (novoLOG) injection 0-9 Units  0-9 Units Subcutaneous TID WC Asencion Noble, MD   9 Units at 02/15/20 0800  . insulin aspart (novoLOG) injection 10 Units  10 Units Subcutaneous TID with meals Axel Filler, MD      . insulin glargine (LANTUS) injection 20 Units  20 Units Subcutaneous Daily Axel Filler, MD      . levothyroxine (SYNTHROID) tablet 125 mcg   125 mcg Oral QAC breakfast Asencion Noble, MD   125 mcg at 02/15/20 0641  . metoprolol succinate (TOPROL-XL) 24 hr tablet 50 mg  50 mg Oral BID Asencion Noble, MD   50 mg at 02/14/20 1120  . pantoprazole (PROTONIX) EC tablet 80 mg  80 mg Oral Daily Asencion Noble, MD   80 mg at 02/15/20 0857  . pregabalin (LYRICA) capsule 75 mg  75 mg Oral Daily Asencion Noble, MD   75 mg at 02/15/20 0856  . remdesivir 100 mg in sodium chloride 0.9 % 100 mL IVPB  100 mg Intravenous Daily Rumbarger, Rachel L, RPH 200 mL/hr at 02/15/20 0851 100 mg at 02/15/20 0851  . umeclidinium bromide (INCRUSE ELLIPTA) 62.5 MCG/INH 1 puff  1 puff Inhalation Daily Axel Filler, MD   1 puff at 02/14/20 1123      Review of Systems: Unable to obtain today  Physical Exam: Vitals:   02/15/20 0723 02/15/20 0853  BP: (!) 89/54 (!) 90/38  Pulse: 74 73  Resp:    Temp:  99 F (37.2 C)  SpO2:  91%   No intake/output data recorded.  Intake/Output Summary (Last 24 hours) at 02/15/2020 0957 Last data filed at 02/15/2020 1017 Gross per 24 hour  Intake 300 ml  Output 2368 ml  Net -2068 ml   Unable to perform today   Test Results I personally reviewed new and old clinical labs and radiology tests Lab Results  Component Value Date   NA 133 (L) 02/15/2020   K 4.3 02/15/2020   CL 91 (L) 02/15/2020   CO2 23 02/15/2020   BUN 62 (H) 02/15/2020   CREATININE 9.33 (H) 02/15/2020   CALCIUM 8.6 (L) 02/15/2020   ALBUMIN 2.8 (L) 02/15/2020   PHOS 8.0 (H) 02/15/2020

## 2020-02-15 NOTE — Progress Notes (Signed)
   Subjective:   Patient reports feeling about the same today. She reports she is still feeling short of breath and coughing. Denies any chest pain or abdominal pain. She states she is continuing to have diarrhea about every 3 hours.   Objective:  Vital signs in last 24 hours: Vitals:   02/14/20 1800 02/14/20 1830 02/14/20 2313 02/15/20 0224  BP: 114/67 112/62 (!) 97/47 (!) 104/54  Pulse: 83 89 80 67  Resp: (!) 27 (!) 27 (!) 24 20  Temp:   (!) 100.7 F (38.2 C) 99.5 F (37.5 C)  TempSrc:   Oral   SpO2: 98% 96% 91% 93%  Weight:      Height:        Supplemental Oxygen: 93% on 5 L nasal cannula  Constitution: appears uncomfortable, appears stated age Cardio: RRR, no m/r/g, no LE edema  Respiratory: CTA, no w/r/r Abdominal: NTTP, soft, non-distended MSK: moving all extremities Neuro: normal affect, a&ox3 Skin: c/d/i   Assessment/Plan:  Principal Problem:   COVID-19 Active Problems:   ESRD on dialysis ([facility])   Diabetes mellitus type 2 in obese (HCC)   OSA (obstructive sleep apnea)  49 year old female with ESRD on hemodialysis and chronic lung disease on chronic supplemental oxygen at 3 L/min being admitted with acute on chronic hypoxic respiratory failure due to COVID-19 pneumonia.    [facility] day 2.  Saturating at 93% on 5 L nasal cannula.  Day 2 of remdesivir and dexamethasone. Inflammatory markers remain elevated, D-dimer 2.7, CRP 17, Ferritin >7500; continue to trend. Baricitinib is not recommended in ESRD; can consider tocilizumab if available and oxygen requirements worsen.   - tussionex q12h prn, robitussen q4h prn - remdesivir day 2/5 - dexamethasone day 2 - IS, flutter valve  Diabetes Blood glucose remains elevated, 466 this morning.  Started on Lantus 10 units, will increase to 20 units and will additionally start patient on 10 units aspart with meals.  - Lantus 20 units qhs - Insulin aspart 10 units with meals TID  - SSI   ESRD on HD Patient  last dialyzed 9/14.  - Nephrology on board, appreciate consult  VTE: Lovenox IVF: None Diet: Heart/renal Code: Full code  Dispo: Anticipated discharge pending clinical improvement.   Novalee Horsfall N, DO 02/15/2020, 7:16 AM Pager: [phone] After 5pm on weekdays and 1pm on weekends: On Call Pager: [phone]

## 2020-02-15 NOTE — Progress Notes (Signed)
Renal Navigator received information from patient's OP HD clinic manager stating that patient initially tested positive at the HD clinic on 02/09/20. She will need to treat in isolation for 21 days from this date per [employer]. [place] COVID shift is MWF at 5:00pm. Patients are instructed to arrive to the parking lot around 4:30pm and wait to be called in. Patient is aware of this schedule since she already started treatment on this shift prior to being admitted to the hospital. Patient may return to her normal seat schedule after 03/01/20.  Navigator will follow up with patient during hospitalization if possible to confirm above plan for discharge.  Alphonzo Cruise, [place] Renal Navigator [phone]

## 2020-02-15 NOTE — Progress Notes (Addendum)
Patient desat with 6L [place] into the low 80s, she appears tachpenic at this time. HFNC applied at 6L but her sat still sustaining in the 80s. Increase HFNC to 9L sating between 88-90s at this time. Still appears tacnpneic. MD paged, rec'd for pt to prone at this time, pt stated she is not able to lie prone, she is able to lie on her left side. MD paged. In crease HFNC to 13 sating between 90-32 at this time. Will continue to monitor.

## 2020-02-15 NOTE — Progress Notes (Signed)
Per RPH, hold heparin at this time and to resume dose for tomorrow since lovenox already given this AM.

## 2020-02-15 NOTE — Progress Notes (Signed)
Inpatient Diabetes Program Recommendations  AACE/ADA: New Consensus Statement on Inpatient Glycemic Control   Target Ranges:  Prepandial:   less than 140 mg/dL      Peak postprandial:   less than 180 mg/dL (1-2 hours)      Critically ill patients:  140 - 180 mg/dL   Results for KAITLYNNE, WENZ (MRN [id number]) as of 02/15/2020 09:25  Ref. Range 02/14/2020 13:45 02/14/2020 17:57 02/14/2020 23:37 02/15/2020 02:19 02/15/2020 06:12 02/15/2020 07:22  Glucose-Capillary Latest Ref Range: 70 - 99 mg/dL 478 (H)  Novolog 9 units 298 (H)  Novolog 5 units 441 (H)  Novolog 10 units 466 (H)  Novolog 15 units  Lantus 10 units@3 :30 359 (H) 330 (H)  Novolog 14 units   Review of Glycemic Control  Diabetes history: DM2 Outpatient Diabetes medications: Humalog 3 units in the morning and 6 units at night Current orders for Inpatient glycemic control: Lantus 10 units daily, Novolog 5 units TID with meals, Novolog 0-9 units TID with meals; Decadron 6 mg daily  Inpatient Diabetes Program Recommendations:    Insulin: If steroids are continued as ordered, please consider increasing Lantus to 12 units BID, increasing Novolog correction to 0-15 units TID and adding Novolog 0-5 units QHS.   NOTE: In reviewing chart, noted patient sees Arlis Porta, PA with [facility] for DM management and was last seen on 11/30/19. Per office note on 11/30/19 patient's A1C was 8% on 11/30/19 and patient is prescribed Glimepiride 4mg  QAM with breakfast, Glimepiride 2 mg QPM with supper, Victoza 1.8 mg daily, Humulin R U500 (concentrated insulin) 3 units at 8am and 6 units at 10pm. Per current home medication list the only DM medication listed is Humalog. Attempted to talk with patient to verify outpatient DM medications and discuss A1C.  Have tried calling patient's room phone 2 times but no answer. Will attempt again later today.  Thanks, Barnie Alderman, RN, MSN, CDE Diabetes Coordinator Inpatient Diabetes  Program [phone] (Team Pager from 8am to 5pm)

## 2020-02-15 NOTE — Progress Notes (Signed)
Patient tolerates 13 HFNC sustaining sat between 90-96. Continous pulse ox monitoring system at bedside. Patient shares she does feel a little better. Please see VS on flow sheet.

## 2020-02-16 LAB — CBC WITH DIFFERENTIAL/PLATELET
Abs Immature Granulocytes: 0 10*3/uL (ref 0.00–0.07)
Basophils Absolute: 0 10*3/uL (ref 0.0–0.1)
Basophils Relative: 0 %
Eosinophils Absolute: 0 10*3/uL (ref 0.0–0.5)
Eosinophils Relative: 0 %
HCT: 25.9 % — ABNORMAL LOW (ref 36.0–46.0)
Hemoglobin: 8.8 g/dL — ABNORMAL LOW (ref 12.0–15.0)
Lymphocytes Relative: 5 %
Lymphs Abs: 0.6 10*3/uL — ABNORMAL LOW (ref 0.7–4.0)
MCH: 28.1 pg (ref 26.0–34.0)
MCHC: 34 g/dL (ref 30.0–36.0)
MCV: 82.7 fL (ref 80.0–100.0)
Monocytes Absolute: 0.4 10*3/uL (ref 0.1–1.0)
Monocytes Relative: 4 %
Neutro Abs: 10 10*3/uL — ABNORMAL HIGH (ref 1.7–7.7)
Neutrophils Relative %: 91 %
Platelets: 240 10*3/uL (ref 150–400)
RBC: 3.13 MIL/uL — ABNORMAL LOW (ref 3.87–5.11)
RDW: 14.6 % (ref 11.5–15.5)
WBC: 11 10*3/uL — ABNORMAL HIGH (ref 4.0–10.5)
nRBC: 0 /100 WBC
nRBC: 0.2 % (ref 0.0–0.2)

## 2020-02-16 LAB — COMPREHENSIVE METABOLIC PANEL
ALT: 24 U/L (ref 0–44)
ALT: 27 U/L (ref 0–44)
AST: 53 U/L — ABNORMAL HIGH (ref 15–41)
AST: 49 U/L — ABNORMAL HIGH (ref 15–41)
Albumin: 2.5 g/dL — ABNORMAL LOW (ref 3.5–5.0)
Albumin: 2.5 g/dL — ABNORMAL LOW (ref 3.5–5.0)
Alkaline Phosphatase: 65 U/L (ref 38–126)
Alkaline Phosphatase: 75 U/L (ref 38–126)
Anion gap: 21 — ABNORMAL HIGH (ref 5–15)
Anion gap: 23 — ABNORMAL HIGH (ref 5–15)
BUN: 91 mg/dL — ABNORMAL HIGH (ref 6–20)
BUN: 111 mg/dL — ABNORMAL HIGH (ref 6–20)
CO2: 18 mmol/L — ABNORMAL LOW (ref 22–32)
CO2: 18 mmol/L — ABNORMAL LOW (ref 22–32)
Calcium: 9 mg/dL (ref 8.9–10.3)
Calcium: 9.1 mg/dL (ref 8.9–10.3)
Chloride: 93 mmol/L — ABNORMAL LOW (ref 98–111)
Chloride: 88 mmol/L — ABNORMAL LOW (ref 98–111)
Creatinine, Ser: 11.83 mg/dL — ABNORMAL HIGH (ref 0.44–1.00)
Creatinine, Ser: 13.17 mg/dL — ABNORMAL HIGH (ref 0.44–1.00)
GFR calc Af Amer: 4 mL/min — ABNORMAL LOW (ref >60)
GFR calc Af Amer: 3 mL/min — ABNORMAL LOW (ref >60)
GFR calc non Af Amer: 3 mL/min — ABNORMAL LOW (ref >60)
GFR calc non Af Amer: 3 mL/min — ABNORMAL LOW (ref >60)
Glucose, Bld: 495 mg/dL — ABNORMAL HIGH (ref 70–99)
Glucose, Bld: 636 mg/dL (ref 70–99)
Potassium: 4.4 mmol/L (ref 3.5–5.1)
Potassium: 4.8 mmol/L (ref 3.5–5.1)
Sodium: 132 mmol/L — ABNORMAL LOW (ref 135–145)
Sodium: 129 mmol/L — ABNORMAL LOW (ref 135–145)
Total Bilirubin: 0.5 mg/dL (ref 0.3–1.2)
Total Bilirubin: 0.9 mg/dL (ref 0.3–1.2)
Total Protein: 6.9 g/dL (ref 6.5–8.1)
Total Protein: 6.8 g/dL (ref 6.5–8.1)

## 2020-02-16 LAB — C-REACTIVE PROTEIN: CRP: 17.6 mg/dL — ABNORMAL HIGH (ref <1.0)

## 2020-02-16 LAB — GLUCOSE, CAPILLARY
Glucose-Capillary: 490 mg/dL — ABNORMAL HIGH (ref 70–99)
Glucose-Capillary: 589 mg/dL (ref 70–99)
Glucose-Capillary: 183 mg/dL — ABNORMAL HIGH (ref 70–99)
Glucose-Capillary: 246 mg/dL — ABNORMAL HIGH (ref 70–99)

## 2020-02-16 LAB — D-DIMER, QUANTITATIVE: D-Dimer, Quant: 2.08 ug/mL-FEU — ABNORMAL HIGH (ref 0.00–0.50)

## 2020-02-16 LAB — PHOSPHORUS: Phosphorus: 8.9 mg/dL — ABNORMAL HIGH (ref 2.5–4.6)

## 2020-02-16 LAB — FERRITIN: Ferritin: >7500 ng/mL — ABNORMAL HIGH (ref 11–307)

## 2020-02-16 LAB — MAGNESIUM: Magnesium: 2.8 mg/dL — ABNORMAL HIGH (ref 1.7–2.4)

## 2020-02-16 MED ORDER — INSULIN GLARGINE 100 UNIT/ML ~~LOC~~ SOLN
20.0000 [IU] | Freq: Two times a day (BID) | SUBCUTANEOUS | Status: DC
  Administered 2020-02-16 – 2020-02-19 (×6): 20 [IU] via SUBCUTANEOUS
  Filled 2020-02-16 (×7): qty 0.2

## 2020-02-16 MED ORDER — FERRIC CITRATE 1 GM 210 MG(FE) PO TABS
840.0000 mg | ORAL_TABLET | Freq: Three times a day (TID) | ORAL | Status: DC
  Administered 2020-02-16 – 2020-02-24 (×19): 840 mg via ORAL
  Filled 2020-02-16 (×20): qty 4

## 2020-02-16 MED ORDER — INSULIN ASPART 100 UNIT/ML ~~LOC~~ SOLN
0.0000 [IU] | Freq: Three times a day (TID) | SUBCUTANEOUS | Status: DC
  Administered 2020-02-16: 9 [IU] via SUBCUTANEOUS

## 2020-02-16 MED ORDER — INSULIN REGULAR(HUMAN) IN NACL 100-0.9 UT/100ML-% IV SOLN
INTRAVENOUS | Status: DC
  Filled 2020-02-16 (×2): qty 100

## 2020-02-16 MED ORDER — INSULIN ASPART 100 UNIT/ML ~~LOC~~ SOLN
0.0000 [IU] | Freq: Three times a day (TID) | SUBCUTANEOUS | Status: DC
  Administered 2020-02-16: 2 [IU] via SUBCUTANEOUS
  Administered 2020-02-17: 7 [IU] via SUBCUTANEOUS

## 2020-02-16 MED ORDER — INSULIN ASPART 100 UNIT/ML ~~LOC~~ SOLN: 0.0000 [IU] | Freq: Three times a day (TID) | SUBCUTANEOUS | Status: DC

## 2020-02-16 MED ORDER — INSULIN ASPART 100 UNIT/ML ~~LOC~~ SOLN
8.0000 [IU] | Freq: Three times a day (TID) | SUBCUTANEOUS | Status: DC
  Administered 2020-02-16 – 2020-02-17 (×2): 8 [IU] via SUBCUTANEOUS

## 2020-02-16 MED ORDER — INSULIN GLARGINE 100 UNIT/ML ~~LOC~~ SOLN: 25.0000 [IU] | Freq: Every day | SUBCUTANEOUS | Status: DC

## 2020-02-16 NOTE — Progress Notes (Signed)
   Subjective: Patient reports feeling better this morning. She feels her breathing has improved. Denies any chest pain, abdominal pain or diarrhea today.   Objective:  Vital signs in last 24 hours: Vitals:   02/15/20 2134 02/16/20 0003 02/16/20 0050 02/16/20 0349  BP: (!) 101/54 102/60  (!) 109/91  Pulse:  65 62 60  Resp:  (!) 28  20  Temp:  97.9 F (36.6 C)  97.6 F (36.4 C)  TempSrc:  Oral    SpO2:  90% 98% 93%  Weight:      Height:        Supplemental Oxygen: 93% 15L HFNC  Constitution: seen sitting up in the chair this morning, appears comfortable, appears stated age Cardio: RRR, no m/r/g, no LE edema  Respiratory: CTA, no w/r/r Abdominal: NTTP, soft, non-distended MSK: moving all extremities Neuro: normal affect, a&ox3 Skin: c/d/i   Assessment/Plan:  Principal Problem:   COVID-19 Active Problems:   ESRD on dialysis ([place])   Diabetes mellitus type 2 in obese (HCC)   OSA (obstructive sleep apnea)  49 year old female with ESRD on hemodialysis and chronic lung disease on chronic supplemental oxygen at 3 L/min being admitted with acute on chronic hypoxic respiratory failure due to COVID-19 pneumonia.   [facility] day 4.  Saturating at 93% on 15 L nasal cannula.  Day 3 of decadron and remdesivir. Given one dose of tocilizumab yesterday due to worsening oxygen requirements. Inflammatory markers are predominately unchanged today, CRP 17.6, D-dimer 2, Ferritin >7500; will continue to trend. Encouraged pulm rehab with use of incentive spirometer today.    - tussionex q12h prn, robitussen q4h prn - remdesivir day 3/5 - dexamethasone day 3 - given one dose of tocilizumab on 9/15 - IS, flutter valve  Diabetes Blood glucose remains elevated, 495 this morning.  Increase Lantus to 25 units and continue 10 units aspart with meals.   - Lantus 25 units qhs - Insulin aspart 10 units with meals TID  - SSI   ESRD on HD Patient last dialyzed 9/14.  - Nephrology  on board, appreciate consult  VTE: Lovenox IVF: None Diet: Heart/renal Code: Full code  Dispo: Anticipated discharge pending clinical improvement.  Tayte Childers N, DO 02/16/2020, 7:23 AM Pager: [phone] After 5pm on weekdays and 1pm on weekends: On Call Pager: [phone]

## 2020-02-16 NOTE — Progress Notes (Signed)
Patient seen this afternoon due to reported hypotension, 81/53. Patient reports feeling lightheaded and dizzy.  Denies any changes in her vision.  States she does not normally feel the symptoms after hemodialysis.  Hypotension and report of blood clots in stool this morning are concerning for GI bleed.  Performed rectal exam.  Vitals:   02/16/20 1700 02/16/20 1730  BP: (!) 90/38 (!) 88/45  Pulse: 61 60  Resp: 20 (!) 22  Temp:    SpO2:     Physical Exam: Constitutional: Appears uncomfortable, seen sitting up in bed Rectal exam: Normal tone, small rectal hemorrhoids noted, no evidence of bleed

## 2020-02-16 NOTE — Progress Notes (Signed)
Patient blood glucose 589. Patient b/p 81/53, asymptomatic. MD alerted.

## 2020-02-16 NOTE — Progress Notes (Signed)
Patient had BM with multiple blood clots, MD alerted.

## 2020-02-16 NOTE — Progress Notes (Signed)
Blood Glucose 490, previously confirmed with blood draw. Addtl blood draw not ordered. MD alerted.

## 2020-02-16 NOTE — Progress Notes (Signed)
Nephrology Follow-Up Consult note   Assessment/Recommendations: Wanda Ramirez is a/an 49 y.o. female with a past medical history significant for ESRD on HD admitted with acute on chronic hypoxic respiratory failure 2/2 covid.   # ESRD: TTS, BFR 450, DFR 800, k 2, na 137, ca 2.25, bicarb 35, f 180.  Continue hemodialysis per schedule. Holding heparin 2000 units with each treatment # Volume/ hypertension: EDW 111kg.  No weights since being in here.  Blood pressure on the low side.  We will hold her amlodipine and consider holding metoprolol if blood pressure remains low.  We will try to get weight today and remove 4 L at dialysis. # Anemia of Chronic Kidney Disease: Hemoglobin  8.8. Currently receiving aranesp 68mcg weekly will continue here at higher dose of 135mcg qtuesday # Secondary Hyperparathyroidism/Hyperphosphatemia:  Phosphorus remains high at 8.9.  Increase Auryxia to 840 3 times daily.  Continue Sensipar 60 mg every other day # Vascular access: AVF with no issues # Hospital problem: COVID infection/Acute on chronic hypoxic respiratory failure: Unvaccinated.  Continues to have increasing oxygen requirement.  Continue therapy per primary team.  Attempt to remove fluid with dialysis as above  # Additional recommendations: - Dose all meds for creatinine clearance <10 ml/min  - Unless absolutely necessary, no MRIs with gadolinium.  - Implement save arm precautions. Prefer needle sticks in the dorsum of the hands or wrists. No blood pressure measurements in arm. - If blood transfusion is requested during hemodialysis sessions, please alert [place] prior to the session.    Recommendations conveyed to primary service.    [facility] 02/16/2020 10:38 AM  ___________________________________________________________  CC: esrd  Interval History/Subjective: Worsening respiratory status over the past 24 hours now on high flow nasal cannula.  Patient states  today that she has had worsening shortness of breath and also seems to be somewhat confused.  Phosphorus elevated 8.9.  Denies chest pain does have some nausea.   Medications:  Current Facility-Administered Medications  Medication Dose Route Frequency Provider Last Rate Last Admin  . acetaminophen (TYLENOL) tablet 650 mg  650 mg Oral Q6H PRN Asencion Noble, MD   650 mg at 02/16/20 2440   Or  . acetaminophen (TYLENOL) suppository 650 mg  650 mg Rectal Q6H PRN Asencion Noble, MD      . acetaminophen (TYLENOL) tablet 650 mg  650 mg Oral Once Sherry Ruffing, Marissa M, MD      . albuterol (VENTOLIN HFA) 108 (90 Base) MCG/ACT inhaler 2 puff  2 puff Inhalation Q4H PRN Asencion Noble, MD   2 puff at 02/14/20 0900  . amLODipine (NORVASC) tablet 10 mg  10 mg Oral QHS Asencion Noble, MD   10 mg at 02/15/20 2135  . Chlorhexidine Gluconate Cloth 2 % PADS 6 each  6 each Topical Q0600 Reesa Chew, MD   6 each at 02/16/20 [phone]  . cinacalcet (SENSIPAR) tablet 60 mg  60 mg Oral Silvestre Gunner, MD   60 mg at 02/16/20 2536  . Darbepoetin Alfa (ARANESP) injection 100 mcg  100 mcg Intravenous Q Tue-HD Reesa Chew, MD   100 mcg at 02/14/20 1605  . dexamethasone (DECADRON) injection 6 mg  6 mg Intravenous Daily Asencion Noble, MD   6 mg at 02/16/20 6440  . ferric citrate (AURYXIA) tablet 840 mg  840 mg Oral TID WC Reesa Chew, MD   840 mg at 02/16/20 3474  . guaiFENesin-dextromethorphan (ROBITUSSIN  DM) 100-10 MG/5ML syrup 10 mL  10 mL Oral Q4H PRN Asencion Noble, MD   10 mL at 02/16/20 0939  . heparin injection 5,000 Units  5,000 Units Subcutaneous Q8H Axel Filler, MD      . insulin aspart (novoLOG) injection 0-9 Units  0-9 Units Subcutaneous TID AC & HS Shalhoub, Sherryll Burger, MD   9 Units at 02/16/20 [phone]  . insulin aspart (novoLOG) injection 10 Units  10 Units Subcutaneous TID with meals Axel Filler, MD   10 Units at 02/16/20 [phone]  . insulin glargine  (LANTUS) injection 20 Units  20 Units Subcutaneous Daily Axel Filler, MD   20 Units at 02/16/20 [phone]  . levothyroxine (SYNTHROID) tablet 125 mcg  125 mcg Oral QAC breakfast Asencion Noble, MD   125 mcg at 02/16/20 4132  . metoprolol succinate (TOPROL-XL) 24 hr tablet 50 mg  50 mg Oral BID Asencion Noble, MD   50 mg at 02/16/20 4401  . pantoprazole (PROTONIX) EC tablet 80 mg  80 mg Oral Daily Asencion Noble, MD   80 mg at 02/16/20 0272  . pregabalin (LYRICA) capsule 75 mg  75 mg Oral Daily Asencion Noble, MD   75 mg at 02/16/20 5366  . remdesivir 100 mg in sodium chloride 0.9 % 100 mL IVPB  100 mg Intravenous Daily Rumbarger, Rachel L, RPH 200 mL/hr at 02/16/20 0836 100 mg at 02/16/20 0836  . umeclidinium bromide (INCRUSE ELLIPTA) 62.5 MCG/INH 1 puff  1 puff Inhalation Daily Axel Filler, MD   1 puff at 02/16/20 4403      Review of Systems: 10 systems reviewed and negative except per HPI  Physical Exam: Vitals:   02/16/20 0349 02/16/20 0859  BP: (!) 109/91   Pulse: 60 (!) 121  Resp: 20   Temp: 97.6 F (36.4 C)   SpO2: 93% 91%   No intake/output data recorded.  Intake/Output Summary (Last 24 hours) at 02/16/2020 1038 Last data filed at 02/15/2020 1404 Gross per 24 hour  Intake 340 ml  Output --  Net 340 ml   General: Ill-appearing, obese, sitting on the bed HEENT: anicteric sclera, MMM CV: normal rate, no murmurs, trace pitting edema in the lower extremities bilaterally Lungs: bilateral chest rise, increased work of breathing Abd: soft, non-tender, mild distention Skin: no visible lesions or rashes Psych: alert, engaged, appropriate mood and affect Neuro: normal speech, no gross focal deficits     Test Results I personally reviewed new and old clinical labs and radiology tests Lab Results  Component Value Date   NA 132 (L) 02/16/2020   K 4.4 02/16/2020   CL 93 (L) 02/16/2020   CO2 18 (L) 02/16/2020   BUN 91 (H) 02/16/2020    CREATININE 11.83 (H) 02/16/2020   CALCIUM 9.0 02/16/2020   ALBUMIN 2.5 (L) 02/16/2020   PHOS 8.9 (H) 02/16/2020

## 2020-02-16 NOTE — Progress Notes (Signed)
Inpatient Diabetes Program Recommendations  AACE/ADA: New Consensus Statement on Inpatient Glycemic Control  Target Ranges:  Prepandial:   less than 140 mg/dL      Peak postprandial:   less than 180 mg/dL (1-2 hours)      Critically ill patients:  140 - 180 mg/dL  Results for ANITA, LAGUNA (MRN [id number]) as of 02/16/2020 07:04  Ref. Range 02/16/2020 01:22  Glucose Latest Ref Range: 70 - 99 mg/dL 495 (H)   Results for ANGELLI, BARUCH (MRN [id number]) as of 02/16/2020 07:04  Ref. Range 02/15/2020 07:22 02/15/2020 12:17 02/15/2020 16:25 02/15/2020 20:31  Glucose-Capillary Latest Ref Range: 70 - 99 mg/dL 330 (H) 271 (H) 299 (H) 335 (H)   Review of Glycemic Control  Diabetes history: DM2 Outpatient Diabetes medications: Humalog 3 units in the morning and 6 units at night Current orders for Inpatient glycemic control: Lantus 20 units daily, Novolog 10 units TID with meals, Novolog 0-9 units TID with meals and bedtime; Decadron 6 mg daily  Inpatient Diabetes Program Recommendations:    Insulin: If steroids are continued as ordered, please consider increasing Lantus to 20 units BID (starting this morning), increasing Novolog correction to 0-15 units AC&HS.  NOTE: In reviewing chart, noted patient sees Arlis Porta, PA with [facility] for DM management and was last seen on 11/30/19. Per office note on 11/30/19 patient's A1C was 8% on 11/30/19 and patient is prescribed Glimepiride 4mg  QAM with breakfast, Glimepiride 2 mg QPM with supper, Victoza 1.8 mg daily, Humulin R U500 (concentrated insulin) 3 units at 8am and 6 units at 10pm. Per current home medication list the only DM medication listed is Humalog.  Thanks, Barnie Alderman, RN, MSN, CDE Diabetes Coordinator Inpatient Diabetes Program [phone] (Team Pager from 8am to 5pm)

## 2020-02-17 LAB — COMPREHENSIVE METABOLIC PANEL
ALT: 28 U/L (ref 0–44)
AST: 52 U/L — ABNORMAL HIGH (ref 15–41)
Albumin: 2.4 g/dL — ABNORMAL LOW (ref 3.5–5.0)
Alkaline Phosphatase: 75 U/L (ref 38–126)
Anion gap: 18 — ABNORMAL HIGH (ref 5–15)
BUN: 63 mg/dL — ABNORMAL HIGH (ref 6–20)
CO2: 23 mmol/L (ref 22–32)
Calcium: 8.4 mg/dL — ABNORMAL LOW (ref 8.9–10.3)
Chloride: 94 mmol/L — ABNORMAL LOW (ref 98–111)
Creatinine, Ser: 7.9 mg/dL — ABNORMAL HIGH (ref 0.44–1.00)
GFR calc Af Amer: 6 mL/min — ABNORMAL LOW (ref >60)
GFR calc non Af Amer: 5 mL/min — ABNORMAL LOW (ref >60)
Glucose, Bld: 338 mg/dL — ABNORMAL HIGH (ref 70–99)
Potassium: 4 mmol/L (ref 3.5–5.1)
Sodium: 135 mmol/L (ref 135–145)
Total Bilirubin: 0.8 mg/dL (ref 0.3–1.2)
Total Protein: 6.7 g/dL (ref 6.5–8.1)

## 2020-02-17 LAB — CBC WITH DIFFERENTIAL/PLATELET
Abs Immature Granulocytes: 0.1 10*3/uL — ABNORMAL HIGH (ref 0.00–0.07)
Basophils Absolute: 0 10*3/uL (ref 0.0–0.1)
Basophils Relative: 0 %
Eosinophils Absolute: 0 10*3/uL (ref 0.0–0.5)
Eosinophils Relative: 0 %
HCT: 26.6 % — ABNORMAL LOW (ref 36.0–46.0)
Hemoglobin: 9 g/dL — ABNORMAL LOW (ref 12.0–15.0)
Lymphocytes Relative: 1 %
Lymphs Abs: 0.1 10*3/uL — ABNORMAL LOW (ref 0.7–4.0)
MCH: 28.2 pg (ref 26.0–34.0)
MCHC: 33.8 g/dL (ref 30.0–36.0)
MCV: 83.4 fL (ref 80.0–100.0)
Monocytes Absolute: 0.2 10*3/uL (ref 0.1–1.0)
Monocytes Relative: 2 %
Myelocytes: 1 %
Neutro Abs: 11.8 10*3/uL — ABNORMAL HIGH (ref 1.7–7.7)
Neutrophils Relative %: 96 %
Platelets: 336 10*3/uL (ref 150–400)
RBC: 3.19 MIL/uL — ABNORMAL LOW (ref 3.87–5.11)
RDW: 14.6 % (ref 11.5–15.5)
WBC: 12.3 10*3/uL — ABNORMAL HIGH (ref 4.0–10.5)
nRBC: 0.6 % — ABNORMAL HIGH (ref 0.0–0.2)
nRBC: 2 /100 WBC — ABNORMAL HIGH

## 2020-02-17 LAB — C-REACTIVE PROTEIN: CRP: 12.6 mg/dL — ABNORMAL HIGH (ref <1.0)

## 2020-02-17 LAB — D-DIMER, QUANTITATIVE: D-Dimer, Quant: 2.02 ug/mL-FEU — ABNORMAL HIGH (ref 0.00–0.50)

## 2020-02-17 LAB — GLUCOSE, CAPILLARY
Glucose-Capillary: 360 mg/dL — ABNORMAL HIGH (ref 70–99)
Glucose-Capillary: 446 mg/dL — ABNORMAL HIGH (ref 70–99)
Glucose-Capillary: 427 mg/dL — ABNORMAL HIGH (ref 70–99)
Glucose-Capillary: 314 mg/dL — ABNORMAL HIGH (ref 70–99)

## 2020-02-17 LAB — FERRITIN: Ferritin: >7500 ng/mL — ABNORMAL HIGH (ref 11–307)

## 2020-02-17 LAB — PHOSPHORUS: Phosphorus: 6.1 mg/dL — ABNORMAL HIGH (ref 2.5–4.6)

## 2020-02-17 LAB — MAGNESIUM: Magnesium: 2.4 mg/dL (ref 1.7–2.4)

## 2020-02-17 MED ORDER — INSULIN ASPART 100 UNIT/ML ~~LOC~~ SOLN
0.0000 [IU] | Freq: Three times a day (TID) | SUBCUTANEOUS | Status: DC
  Administered 2020-02-17: 9 [IU] via SUBCUTANEOUS
  Administered 2020-02-17: 10 [IU] via SUBCUTANEOUS
  Administered 2020-02-18 (×2): 7 [IU] via SUBCUTANEOUS
  Administered 2020-02-19: 1 [IU] via SUBCUTANEOUS
  Administered 2020-02-19 – 2020-02-20 (×3): 2 [IU] via SUBCUTANEOUS
  Administered 2020-02-20: 1 [IU] via SUBCUTANEOUS
  Administered 2020-02-21 (×2): 3 [IU] via SUBCUTANEOUS
  Administered 2020-02-21: 2 [IU] via SUBCUTANEOUS
  Administered 2020-02-22: 7 [IU] via SUBCUTANEOUS
  Administered 2020-02-22 – 2020-02-23 (×2): 2 [IU] via SUBCUTANEOUS
  Administered 2020-02-23: 1 [IU] via SUBCUTANEOUS
  Administered 2020-02-23: 3 [IU] via SUBCUTANEOUS
  Administered 2020-02-24: 1 [IU] via SUBCUTANEOUS
  Administered 2020-02-26: 2 [IU] via SUBCUTANEOUS
  Administered 2020-02-26 – 2020-02-27 (×2): 3 [IU] via SUBCUTANEOUS

## 2020-02-17 MED ORDER — INSULIN ASPART 100 UNIT/ML ~~LOC~~ SOLN: 0.0000 [IU] | Freq: Three times a day (TID) | SUBCUTANEOUS | Status: DC

## 2020-02-17 MED ORDER — INSULIN ASPART 100 UNIT/ML ~~LOC~~ SOLN
5.0000 [IU] | Freq: Once | SUBCUTANEOUS | Status: AC
  Administered 2020-02-17: 5 [IU] via SUBCUTANEOUS

## 2020-02-17 MED ORDER — INSULIN ASPART 100 UNIT/ML ~~LOC~~ SOLN
15.0000 [IU] | Freq: Three times a day (TID) | SUBCUTANEOUS | Status: DC
  Administered 2020-02-17 – 2020-02-23 (×18): 15 [IU] via SUBCUTANEOUS

## 2020-02-17 MED ORDER — INFLUENZA VAC SPLIT QUAD 0.5 ML IM SUSY
0.5000 mL | PREFILLED_SYRINGE | INTRAMUSCULAR | Status: DC | PRN
  Filled 2020-02-17: qty 0.5

## 2020-02-17 MED ORDER — INSULIN ASPART 100 UNIT/ML ~~LOC~~ SOLN: 12.0000 [IU] | Freq: Three times a day (TID) | SUBCUTANEOUS | Status: DC

## 2020-02-17 NOTE — Progress Notes (Addendum)
Inpatient Diabetes Program Recommendations  AACE/ADA: New Consensus Statement on Inpatient Glycemic Control   Target Ranges:  Prepandial:   less than 140 mg/dL      Peak postprandial:   less than 180 mg/dL (1-2 hours)      Critically ill patients:  140 - 180 mg/dL  Results for Wanda, Ramirez (MRN [id number]) as of 02/17/2020 08:11  Ref. Range 02/17/2020 00:41  Glucose Latest Ref Range: 70 - 99 mg/dL 338 (H)   Results for Wanda, Ramirez (MRN [id number]) as of 02/17/2020 08:11  Ref. Range 02/16/2020 07:28 02/16/2020 11:49 02/16/2020 17:59 02/16/2020 21:15  Glucose-Capillary Latest Ref Range: 70 - 99 mg/dL 490 (H) 589 (HH) 183 (H) 246 (H)  Results for Wanda, Ramirez (MRN [id number]) as of 02/17/2020 08:11  Ref. Range 02/14/2020 12:16  Hemoglobin A1C Latest Ref Range: 4.8 - 5.6 % 12.8 (H)   Review of Glycemic Control  Diabetes history:DM2 Outpatient Diabetes medications:Humulin R U500 3 units in the morning and 6 units at night, Glimepiride 4 mg QAM, Glimepiride 3 mg QPM, Victoza 1.8 mg daily Current orders for Inpatient glycemic control:Lantus 20 units daily, Novolog 10 units TID with meals, Novolog 0-9 units TID with meals and bedtime; Decadron 6 mg daily  Inpatient Diabetes Program Recommendations:  Insulin: If steroids are continuedas ordered, please consider increasing Lantus to 25 units BID (starting this morning), increasing Novolog correction to 0-15 units AC&HS and meal coverage to Novolog 15 units TID with meals.  HbgA1C:  A1C 12.8% on 02/14/20 indicating an average glucose of 321 mg/dl over the past 2-3 months. At time of discharge, please provide Rx for: Glimepiride (patient requested as she needs a refill).  NOTE: In reviewing chart, noted patient sees Arlis Porta, PA with [facility] for DM management and was last seen on 11/30/19. Per office note on 11/30/19 patient's A1C was 8% on 11/30/19 and patient is prescribed Glimepiride 4mg  QAM with breakfast, Glimepiride  2 mg QPM with supper, Victoza 1.8 mg daily, Humulin R U500 (concentrated insulin) 3 units at 8am and 6 units at 10pm. Per current home medication list the only DM medication listed is Humalog. Spoke with patient over the phone regarding DM and outpatient DM medications.   Patient confirms that she sees A. Ronnald Ramp, PA for DM management and she reports she is taking  as an outpatient for diabetes control. Patient reports taking Glimepiride 4mg  QAM with breakfast, Glimepiride 2 mg QPM with supper, Victoza 1.8 mg daily, Humulin R U500 (concentrated insulin) 3 units at 8am and 6 units at 10pm for DM control as an outpatient. Patient states that she takes DM medications consistently. She admits that she may forget to take something at times but that is rare.  Patient reports checking glucose at home and notes that it has been running higher than usually over the past couple of weeks. Discussed A1C results (12.8% on 02/14/20) and explained that current A1C indicates an average glucose of 321 mg/dl over the past 2-3 months. Patient notes prior A1C 8% in June and she is not sure why A1C has increased so much.  Discussed glucose and A1C goals. Discussed importance of checking CBGs and maintaining good CBG control to prevent long-term and short-term complications.  Discussed impact of nutrition, exercise, stress, sickness, and medications on diabetes control.  Explained that currently she is ordered steroids which are contributing to hyperglycemia. Encouraged patient to check glucose 3-4 times a day and to reach out to A. Jones, PA if glucose  is consistently over 200 mg/dl at home as she may need adjustments with outpatient DM medications especially if she is discharged on steroids. Patient states that she has all DM testing supplies at home and notes that she would appreciate a refill on her Glimepiride at time of discharge as she is running low on it. Patient states she has an appointment already scheduled with A. Ronnald Ramp, PA  at the end of this month or beginning of next month (can not remember exactly when but sees her every 3-4 months).   Patient verbalized understanding of information discussed and reports no further questions at this time related to diabetes.   Thanks, Barnie Alderman, RN, MSN, CDE Diabetes Coordinator Inpatient Diabetes Program [phone] (Team Pager from 8am to 5pm)

## 2020-02-17 NOTE — Progress Notes (Signed)
Nephrology Follow-Up Consult note   Assessment/Recommendations: Wanda Ramirez is a/an 49 y.o. female with a past medical history significant for ESRD on HD admitted with acute on chronic hypoxic respiratory failure 2/2 covid.   # ESRD: TTS, BFR 450, DFR 800, k 2, na 137, ca 2.25, bicarb 35, f 180.  Continue hemodialysis per schedule. Holding heparin 2000 units with each treatment # Volume/ hypertension: EDW 111kg.  Under EDW before dialysis yesterday 108 kg.  We will make that her dry weight for now.  Continue to monitor her low blood pressures # Anemia of Chronic Kidney Disease: Hemoglobin  8.8. Currently receiving aranesp 31mcg weekly will continue here at higher dose of 136mcg qtuesday.  Possible blood loss as below # Secondary Hyperparathyroidism/Hyperphosphatemia:  Phosphorus improved to 6.1.  Continue increased dose of Auryxia to 840 3 times daily.  Continue Sensipar 60 mg every other day # Vascular access: AVF with no issues # COVID infection/Acute on chronic hypoxic respiratory failure: Unvaccinated.  Continues to have increasing oxygen requirement.  Continue therapy per primary team. # Bloody stool: f/u repeat CBC. Primary team investigating  # Additional recommendations: - Dose all meds for creatinine clearance <10 ml/min  - Unless absolutely necessary, no MRIs with gadolinium.  - Implement save arm precautions. Prefer needle sticks in the dorsum of the hands or wrists. No blood pressure measurements in arm. - If blood transfusion is requested during hemodialysis sessions, please alert [place] prior to the session.    Recommendations conveyed to primary service.    [facility] 02/17/2020 10:06 AM  ___________________________________________________________  CC: esrd  Interval History/Subjective: Patient states that her breathing is about the same today.  Intermittently on nasal cannula nonrebreather.  Did not tolerate much ultrafiltration  yesterday but is below her dry weight.  Continued to have some low blood pressures yesterday and a bloody bowel movement.  Continues to have cough.  Denies other issues.   Medications:  Current Facility-Administered Medications  Medication Dose Route Frequency Provider Last Rate Last Admin  . acetaminophen (TYLENOL) tablet 650 mg  650 mg Oral Q6H PRN Asencion Noble, MD   650 mg at 02/16/20 1856   Or  . acetaminophen (TYLENOL) suppository 650 mg  650 mg Rectal Q6H PRN Asencion Noble, MD      . acetaminophen (TYLENOL) tablet 650 mg  650 mg Oral Once Sherry Ruffing, Marissa M, MD      . albuterol (VENTOLIN HFA) 108 (90 Base) MCG/ACT inhaler 2 puff  2 puff Inhalation Q4H PRN Asencion Noble, MD   2 puff at 02/14/20 0900  . Chlorhexidine Gluconate Cloth 2 % PADS 6 each  6 each Topical Q0600 Reesa Chew, MD   6 each at 02/17/20 0547  . cinacalcet (SENSIPAR) tablet 60 mg  60 mg Oral Silvestre Gunner, MD   60 mg at 02/16/20 9767  . Darbepoetin Alfa (ARANESP) injection 100 mcg  100 mcg Intravenous Q Tue-HD Reesa Chew, MD   100 mcg at 02/14/20 1605  . dexamethasone (DECADRON) injection 6 mg  6 mg Intravenous Daily Asencion Noble, MD   6 mg at 02/16/20 3419  . ferric citrate (AURYXIA) tablet 840 mg  840 mg Oral TID WC Reesa Chew, MD   840 mg at 02/17/20 1004  . guaiFENesin-dextromethorphan (ROBITUSSIN DM) 100-10 MG/5ML syrup 10 mL  10 mL Oral Q4H PRN Asencion Noble, MD   10 mL at 02/16/20 0939  . heparin injection  5,000 Units  5,000 Units Subcutaneous Q8H Axel Filler, MD   5,000 Units at 02/17/20 0547  . [START ON 02/20/2020] influenza vac split quadrivalent PF (FLUARIX) injection 0.5 mL  0.5 mL Intramuscular Prior to discharge Axel Filler, MD      . insulin aspart (novoLOG) injection 0-9 Units  0-9 Units Subcutaneous TID WC Rehman, Areeg N, DO   2 Units at 02/16/20 1856  . insulin aspart (novoLOG) injection 8 Units  8 Units Subcutaneous TID WC  Rehman, Areeg N, DO   8 Units at 02/16/20 1856  . insulin glargine (LANTUS) injection 20 Units  20 Units Subcutaneous BID Rehman, Areeg N, DO   20 Units at 02/16/20 2122  . levothyroxine (SYNTHROID) tablet 125 mcg  125 mcg Oral QAC breakfast Asencion Noble, MD   125 mcg at 02/17/20 0549  . metoprolol succinate (TOPROL-XL) 24 hr tablet 50 mg  50 mg Oral BID Asencion Noble, MD   50 mg at 02/17/20 1005  . pantoprazole (PROTONIX) EC tablet 80 mg  80 mg Oral Daily Asencion Noble, MD   80 mg at 02/16/20 9024  . pregabalin (LYRICA) capsule 75 mg  75 mg Oral Daily Asencion Noble, MD   75 mg at 02/16/20 0973  . remdesivir 100 mg in sodium chloride 0.9 % 100 mL IVPB  100 mg Intravenous Daily Rumbarger, Valeda Malm, RPH   Stopped at 02/16/20 0908  . umeclidinium bromide (INCRUSE ELLIPTA) 62.5 MCG/INH 1 puff  1 puff Inhalation Daily Axel Filler, MD   1 puff at 02/17/20 0748      Review of Systems: 10 systems reviewed and negative except per HPI  Physical Exam: Vitals:   02/17/20 0309 02/17/20 0838  BP: (!) 95/55 115/66  Pulse: (!) 55 (!) 58  Resp: 19 20  Temp: 98.2 F (36.8 C) 97.6 F (36.4 C)  SpO2: 93% 91%   No intake/output data recorded.  Intake/Output Summary (Last 24 hours) at 02/17/2020 1006 Last data filed at 02/16/2020 1814 Gross per 24 hour  Intake --  Output 0 ml  Net 0 ml   General: Ill-appearing, obese, sitting on the bed HEENT: anicteric sclera, MMM CV: Bradycardia, trace edema in the bilateral lower extremities Lungs: bilateral chest rise, increased work of breathing Abd: soft, non-tender, mild distention Skin: no visible lesions or rashes Psych: alert, engaged, appropriate mood and affect Neuro: normal speech, no gross focal deficits     Test Results I personally reviewed new and old clinical labs and radiology tests Lab Results  Component Value Date   NA 135 02/17/2020   K 4.0 02/17/2020   CL 94 (L) 02/17/2020   CO2 23 02/17/2020   BUN  63 (H) 02/17/2020   CREATININE 7.90 (H) 02/17/2020   CALCIUM 8.4 (L) 02/17/2020   ALBUMIN 2.4 (L) 02/17/2020   PHOS 6.1 (H) 02/17/2020

## 2020-02-17 NOTE — Progress Notes (Signed)
   Subjective: Patient reports feeling about the same this morning. Denies any more bloody bowel movements. She is no longer feeling lightheaded. Continues to have a headache.   Objective:  Vital signs in last 24 hours: Vitals:   02/16/20 1925 02/16/20 2307 02/17/20 0309 02/17/20 0330  BP: (!) 95/57 (!) 96/54 (!) 95/55   Pulse: (!) 59 (!) 57 (!) 55   Resp: 20 20 19    Temp: 98.3 F (36.8 C) 98.2 F (36.8 C) 98.2 F (36.8 C)   TempSrc:      SpO2: (!) 86% 97% 93%   Weight:    109.2 kg  Height:        Supplemental Oxygen: 93% on 15L HFNC  Constitution: NAD, appears stated age  Cardio: RRR, no m/r/g, no LE edema  Respiratory: CTA, no w/r/r Abdominal: NTTP, soft, non-distended MSK: moving all extremities Neuro: normal affect, a&ox3 Skin: c/d/i   Assessment/Plan:  Principal Problem:   COVID-19 Active Problems:   ESRD on dialysis ([place])   Diabetes mellitus type 2 in obese ([facility])   OSA (obstructive sleep apnea)  89 year oldfemalewith ESRDon hemodialysis and chronic lung disease on chronic supplemental oxygen at 3 L/min being admitted with acute on chronic hypoxic respiratory failure due to COVID-19 pneumonia.  [place].Saturating at 93% on 15 L nasal cannula. Ferritin remains elevated, D-dimer and CRP trending down.Day 4 of decadron and remdesivir. Continue to encourage pulmonary rehab.  - tussionex q12h prn, robitussen q4h prn - remdesivir day4/5 -dexamethasoneday4 - one dose of tocilizumab on 9/15 - IS, flutter valve  Diabetes Blood glucose 338. Continue lantus 20 units BID and novolog 10 units wc tid - Lantus 25 units qhs - Insulin aspart 10 units with meals TID  - SSI  ESRD on HD Patient last dialyzed 9/16.  - Nephrology on board, appreciate consult  Hematochezia Patient reportedly had one bloody bowel movement on 9/16 and hypotensive. On rectal exam, there was no evidence of bleed. Repeat CBC this morning is pending. Continued  to have dark BM. Normotensive on exam today. Will continue to monitor  - follow up cbc - continue DVT prophylaxis, will hold if blood stools continue or other convincing evidence of bleeding  IBB:CWUGQBV QXI:HWTU Diet:Heart/renal Code:Full code  Dispo: Anticipated dischargepending clinical improvement.  Alahni Varone N, DO 02/17/2020, 7:13 AM Pager: [phone] After 5pm on weekdays and 1pm on weekends: On Call Pager: [phone]

## 2020-02-17 NOTE — Progress Notes (Signed)
Pt glucose was 427. Called MD. MD stated give 5 more units above the sliding scale. 29 units were adminstered, vrfd by second nurse.

## 2020-02-17 NOTE — Progress Notes (Signed)
Pt c/o some buring when on bsc. Pt stool also noted dark and tarry in color.

## 2020-02-18 LAB — COMPREHENSIVE METABOLIC PANEL
ALT: 25 U/L (ref 0–44)
AST: 36 U/L (ref 15–41)
Albumin: 2.6 g/dL — ABNORMAL LOW (ref 3.5–5.0)
Alkaline Phosphatase: 78 U/L (ref 38–126)
Anion gap: 20 — ABNORMAL HIGH (ref 5–15)
BUN: 96 mg/dL — ABNORMAL HIGH (ref 6–20)
CO2: 21 mmol/L — ABNORMAL LOW (ref 22–32)
Calcium: 9.1 mg/dL (ref 8.9–10.3)
Chloride: 96 mmol/L — ABNORMAL LOW (ref 98–111)
Creatinine, Ser: 11.15 mg/dL — ABNORMAL HIGH (ref 0.44–1.00)
GFR calc Af Amer: 4 mL/min — ABNORMAL LOW (ref >60)
GFR calc non Af Amer: 4 mL/min — ABNORMAL LOW (ref >60)
Glucose, Bld: 218 mg/dL — ABNORMAL HIGH (ref 70–99)
Potassium: 3.8 mmol/L (ref 3.5–5.1)
Sodium: 137 mmol/L (ref 135–145)
Total Bilirubin: 0.5 mg/dL (ref 0.3–1.2)
Total Protein: 6.5 g/dL (ref 6.5–8.1)

## 2020-02-18 LAB — CULTURE, BLOOD (ROUTINE X 2)
Culture: NO GROWTH
Special Requests: ADEQUATE

## 2020-02-18 LAB — MAGNESIUM: Magnesium: 2.6 mg/dL — ABNORMAL HIGH (ref 1.7–2.4)

## 2020-02-18 LAB — C-REACTIVE PROTEIN: CRP: 8.9 mg/dL — ABNORMAL HIGH (ref <1.0)

## 2020-02-18 LAB — CBC WITH DIFFERENTIAL/PLATELET
Abs Immature Granulocytes: 0.38 10*3/uL — ABNORMAL HIGH (ref 0.00–0.07)
Basophils Absolute: 0.1 10*3/uL (ref 0.0–0.1)
Basophils Relative: 0 %
Eosinophils Absolute: 0 10*3/uL (ref 0.0–0.5)
Eosinophils Relative: 0 %
HCT: 29.4 % — ABNORMAL LOW (ref 36.0–46.0)
Hemoglobin: 9.5 g/dL — ABNORMAL LOW (ref 12.0–15.0)
Immature Granulocytes: 3 %
Lymphocytes Relative: 11 %
Lymphs Abs: 1.6 10*3/uL (ref 0.7–4.0)
MCH: 28.8 pg (ref 26.0–34.0)
MCHC: 32.3 g/dL (ref 30.0–36.0)
MCV: 89.1 fL (ref 80.0–100.0)
Monocytes Absolute: 1 10*3/uL (ref 0.1–1.0)
Monocytes Relative: 7 %
Neutro Abs: 11.5 10*3/uL — ABNORMAL HIGH (ref 1.7–7.7)
Neutrophils Relative %: 79 %
Platelets: 353 10*3/uL (ref 150–400)
RBC: 3.3 MIL/uL — ABNORMAL LOW (ref 3.87–5.11)
RDW: 15.3 % (ref 11.5–15.5)
WBC: 14.5 10*3/uL — ABNORMAL HIGH (ref 4.0–10.5)
nRBC: 0.8 % — ABNORMAL HIGH (ref 0.0–0.2)

## 2020-02-18 LAB — GLUCOSE, CAPILLARY
Glucose-Capillary: 199 mg/dL — ABNORMAL HIGH (ref 70–99)
Glucose-Capillary: 307 mg/dL — ABNORMAL HIGH (ref 70–99)
Glucose-Capillary: 314 mg/dL — ABNORMAL HIGH (ref 70–99)
Glucose-Capillary: 389 mg/dL — ABNORMAL HIGH (ref 70–99)

## 2020-02-18 LAB — URINALYSIS, ROUTINE W REFLEX MICROSCOPIC
Bilirubin Urine: NEGATIVE
Glucose, UA: >=500 mg/dL — AB
Ketones, ur: NEGATIVE mg/dL
Nitrite: NEGATIVE
Protein, ur: 100 mg/dL — AB
Specific Gravity, Urine: 1.01 (ref 1.005–1.030)
WBC, UA: >50 WBC/hpf — ABNORMAL HIGH (ref 0–5)
pH: 5 (ref 5.0–8.0)

## 2020-02-18 LAB — D-DIMER, QUANTITATIVE: D-Dimer, Quant: 2.08 ug/mL-FEU — ABNORMAL HIGH (ref 0.00–0.50)

## 2020-02-18 LAB — PHOSPHORUS: Phosphorus: 7.1 mg/dL — ABNORMAL HIGH (ref 2.5–4.6)

## 2020-02-18 LAB — FERRITIN: Ferritin: 7099 ng/mL — ABNORMAL HIGH (ref 11–307)

## 2020-02-18 MED ORDER — SODIUM CHLORIDE 0.9 % IV SOLN: 100.0000 mL | INTRAVENOUS | Status: DC | PRN

## 2020-02-18 MED ORDER — PENTAFLUOROPROP-TETRAFLUOROETH EX AERO: 1.0000 "application " | INHALATION_SPRAY | CUTANEOUS | Status: DC | PRN

## 2020-02-18 MED ORDER — LIDOCAINE-PRILOCAINE 2.5-2.5 % EX CREA
1.0000 "application " | TOPICAL_CREAM | CUTANEOUS | Status: DC | PRN
  Filled 2020-02-18: qty 5

## 2020-02-18 MED ORDER — LIDOCAINE HCL (PF) 1 % IJ SOLN: 5.0000 mL | INTRAMUSCULAR | Status: DC | PRN

## 2020-02-18 MED ORDER — HEPARIN SODIUM (PORCINE) 1000 UNIT/ML IJ SOLN
INTRAMUSCULAR | Status: AC
  Filled 2020-02-18: qty 2

## 2020-02-18 MED ORDER — HEPARIN SODIUM (PORCINE) 1000 UNIT/ML DIALYSIS
1000.0000 [IU] | INTRAMUSCULAR | Status: DC | PRN
  Filled 2020-02-18: qty 1

## 2020-02-18 NOTE — Plan of Care (Signed)

## 2020-02-18 NOTE — Progress Notes (Addendum)
Nephrology Follow-Up Consult note   Assessment/Recommendations: Wanda Ramirez is a/an 49 y.o. female with a past medical history significant for ESRD on HD admitted with acute on chronic hypoxic respiratory failure 2/2 covid.   # ESRD: TTS, BFR 450, DFR 800, k 2, na 137, ca 2.25, bicarb 35, f 180.  Continue hemodialysis per schedule.  Will restart heparin 2000 units with each treatment # Volume/ hypertension: EDW 111kg but down to 108 kg this admission.  Attempting to achieve 108 kg as tolerated.  Monitor for hypotension # Anemia of Chronic Kidney Disease: Hemoglobin  9.5. Currently receiving aranesp 79mcg weekly will continue here at higher dose of 13mcg qtuesday.  Some concern for GI bleeding but hemoglobin stable # Secondary Hyperparathyroidism/Hyperphosphatemia:  Phosphorus remains elevated 7.1 but overall improved.  Continue increased dose of Auryxia to 840 3 times daily.  Continue Sensipar 60 mg every other day # Vascular access: AVF with no issues # COVID infection/Acute on chronic hypoxic respiratory failure: Oxygen requirement persistent.  On remdesivir, dexamethasone, status post tocilizumab.  Continue therapy per primary team. # Bloody stool: Hemoglobin stable at this time.  Possibly related to hemorrhoids. #DM2 uncontrolled with hyperglycemia: Hyperglycemia likely steroids contributing.  Management per primary team  # Additional recommendations: - Dose all meds for creatinine clearance <10 ml/min  - Unless absolutely necessary, no MRIs with gadolinium.  - Implement save arm precautions. Prefer needle sticks in the dorsum of the hands or wrists. No blood pressure measurements in arm. - If blood transfusion is requested during hemodialysis sessions, please alert [place] prior to the session.    Recommendations conveyed to primary service.    [facility] 02/18/2020 8:56 AM  ___________________________________________________________  CC:  esrd  Interval History/Subjective: Patient states that she is overall the same as yesterday.  Continues to have shortness of breath.  Due for dialysis today.  No significant changes to oxygenation status   Medications:  Current Facility-Administered Medications  Medication Dose Route Frequency Provider Last Rate Last Admin   acetaminophen (TYLENOL) tablet 650 mg  650 mg Oral Q6H PRN Asencion Noble, MD   650 mg at 02/16/20 1856   Or   acetaminophen (TYLENOL) suppository 650 mg  650 mg Rectal Q6H PRN Asencion Noble, MD       acetaminophen (TYLENOL) tablet 650 mg  650 mg Oral Once Sherry Ruffing, Marissa M, MD       albuterol (VENTOLIN HFA) 108 (90 Base) MCG/ACT inhaler 2 puff  2 puff Inhalation Q4H PRN Asencion Noble, MD   2 puff at 02/14/20 0900   Chlorhexidine Gluconate Cloth 2 % PADS 6 each  6 each Topical Q0600 Reesa Chew, MD   6 each at 02/18/20 0624   cinacalcet (SENSIPAR) tablet 60 mg  60 mg Oral Silvestre Gunner, MD   60 mg at 02/18/20 7619   Darbepoetin Alfa (ARANESP) injection 100 mcg  100 mcg Intravenous Q Tue-HD Reesa Chew, MD   100 mcg at 02/14/20 1605   dexamethasone (DECADRON) injection 6 mg  6 mg Intravenous Daily Lonia Skinner M, MD   6 mg at 02/18/20 5093   ferric citrate (AURYXIA) tablet 840 mg  840 mg Oral TID WC Reesa Chew, MD   840 mg at 02/18/20 0837   guaiFENesin-dextromethorphan (ROBITUSSIN DM) 100-10 MG/5ML syrup 10 mL  10 mL Oral Q4H PRN Asencion Noble, MD   10 mL at 02/16/20 0939   heparin injection 5,000 Units  5,000 Units Subcutaneous Q8H Axel Filler, MD   5,000 Units at 02/18/20 0624   [START ON 02/20/2020] influenza vac split quadrivalent PF (FLUARIX) injection 0.5 mL  0.5 mL Intramuscular Prior to discharge Axel Filler, MD       insulin aspart (novoLOG) injection 0-20 Units  0-20 Units Subcutaneous TID WC Rehman, Areeg N, DO   9 Units at 02/17/20 1745   insulin aspart (novoLOG) injection 15  Units  15 Units Subcutaneous TID WC Rehman, Areeg N, DO   15 Units at 02/18/20 0833   insulin glargine (LANTUS) injection 20 Units  20 Units Subcutaneous BID Rehman, Areeg N, DO   20 Units at 02/18/20 0840   levothyroxine (SYNTHROID) tablet 125 mcg  125 mcg Oral QAC breakfast Asencion Noble, MD   125 mcg at 02/18/20 6720   metoprolol succinate (TOPROL-XL) 24 hr tablet 50 mg  50 mg Oral BID Asencion Noble, MD   50 mg at 02/17/20 1005   pantoprazole (PROTONIX) EC tablet 80 mg  80 mg Oral Daily Asencion Noble, MD   80 mg at 02/18/20 0837   pregabalin (LYRICA) capsule 75 mg  75 mg Oral Daily Asencion Noble, MD   75 mg at 02/18/20 9470   umeclidinium bromide (INCRUSE ELLIPTA) 62.5 MCG/INH 1 puff  1 puff Inhalation Daily Axel Filler, MD   1 puff at 02/18/20 9628      Review of Systems: 10 systems reviewed and negative except per HPI  Physical Exam: Vitals:   02/17/20 2349 02/18/20 0817  BP: 94/60 94/64  Pulse: (!) 58 (!) 57  Resp: 14 20  Temp: 97.8 F (36.6 C) 97.9 F (36.6 C)  SpO2: 96% 100%   No intake/output data recorded. No intake or output data in the 24 hours ending 02/18/20 0856 General: Ill-appearing, obese, lying in bed HEENT: anicteric sclera, MMM CV: Bradycardia, trace edema in the bilateral lower extremities Lungs: bilateral chest rise, increased work of breathing Abd: soft, non-tender, mild distention Skin: no visible lesions or rashes Psych: alert, engaged, appropriate mood and affect Neuro: normal speech, no gross focal deficits     Test Results I personally reviewed new and old clinical labs and radiology tests Lab Results  Component Value Date   NA 137 02/18/2020   K 3.8 02/18/2020   CL 96 (L) 02/18/2020   CO2 21 (L) 02/18/2020   BUN 96 (H) 02/18/2020   CREATININE 11.15 (H) 02/18/2020   CALCIUM 9.1 02/18/2020   ALBUMIN 2.6 (L) 02/18/2020   PHOS 7.1 (H) 02/18/2020

## 2020-02-18 NOTE — Progress Notes (Signed)
   Subjective: Patient reports feeling about the same this morning.  States her breathing is unchanged.  Denies any chest pain or abdominal pain.  Continues to have a cough.  Denies any burning with urination or vaginal irritation.  States she typically has dark stools since starting iron.  Denies any more episodes of diarrhea.  Objective:  Vital signs in last 24 hours: Vitals:   02/17/20 0838 02/17/20 1724 02/17/20 2349 02/18/20 0500  BP: 115/66 110/68 94/60   Pulse: (!) 58 (!) 58 (!) 58   Resp: 20 20 14    Temp: 97.6 F (36.4 C) (!) 97.5 F (36.4 C) 97.8 F (36.6 C)   TempSrc: Oral Oral Oral   SpO2: 91% 95% 96%   Weight:    109.4 kg  Height:        Supplemental Oxygen: 96% on 15L HFNC  Constitution: NAD, appears stated age Cardio: RRR, no m/r/g, no LE edema  Respiratory: CTA, no w/r/r Abdominal: NTTP, soft, non-distended MSK: moving all extremities Neuro: normal affect, a&ox3 Skin: c/d/i   Assessment/Plan:  Principal Problem:   COVID-19 Active Problems:   ESRD on dialysis ([place])   Diabetes mellitus type 2 in obese (HCC)   OSA (obstructive sleep apnea)  43 year oldfemalewith ESRDon hemodialysis and chronic lung disease on chronic supplemental oxygen at 3 L/min being admitted with acute on chronic hypoxic respiratory failure due to COVID-19 pneumonia.  [place].Saturating at 96% on15 L nasal cannula.Ferritin and CRP trending down.Day 5 of decadron and remdesivir. Continue to encourage pulmonary rehab.  - tussionex q12h prn, robitussen q4h prn - remdesivir day5/5 -dexamethasoneday5 - one dose of tocilizumab on 9/15 - IS, flutter valve  Diabetes Blood glucose 199. Continue lantus 20 units BID and novolog increased to 15 units wc tid - Lantus 20units bid - Insulin aspart 15 units with meals TID  - SSI  ESRD on HD Patient last dialyzed 9/16.  - Nephrology on board, appreciate consult  Hematochezia No more reported bloody  BM. Dark stools continue, possibly due to iron. Hgb stable at 9.5. Normotensive.    - continue DVT prophylaxis, will hold if blood stools continue or other convincing evidence of bleeding  ESP:QZRAQTM AUQ:JFHL Diet:Heart/renal Code:Full code  Dispo: Anticipated dischargepending clinical improvement.  Yuriko Portales N, DO 02/18/2020, 7:19 AM Pager: [phone] After 5pm on weekdays and 1pm on weekends: On Call Pager: [phone]

## 2020-02-19 LAB — CBC WITH DIFFERENTIAL/PLATELET
Abs Immature Granulocytes: 0.51 10*3/uL — ABNORMAL HIGH (ref 0.00–0.07)
Basophils Absolute: 0 10*3/uL (ref 0.0–0.1)
Basophils Relative: 0 %
Eosinophils Absolute: 0 10*3/uL (ref 0.0–0.5)
Eosinophils Relative: 0 %
HCT: 26.8 % — ABNORMAL LOW (ref 36.0–46.0)
Hemoglobin: 8.7 g/dL — ABNORMAL LOW (ref 12.0–15.0)
Immature Granulocytes: 3 %
Lymphocytes Relative: 10 %
Lymphs Abs: 1.9 10*3/uL (ref 0.7–4.0)
MCH: 28.1 pg (ref 26.0–34.0)
MCHC: 32.5 g/dL (ref 30.0–36.0)
MCV: 86.5 fL (ref 80.0–100.0)
Monocytes Absolute: 0.9 10*3/uL (ref 0.1–1.0)
Monocytes Relative: 5 %
Neutro Abs: 15.5 10*3/uL — ABNORMAL HIGH (ref 1.7–7.7)
Neutrophils Relative %: 82 %
Platelets: 421 10*3/uL — ABNORMAL HIGH (ref 150–400)
RBC: 3.1 MIL/uL — ABNORMAL LOW (ref 3.87–5.11)
RDW: 15.3 % (ref 11.5–15.5)
WBC: 18.9 10*3/uL — ABNORMAL HIGH (ref 4.0–10.5)
nRBC: 0.7 % — ABNORMAL HIGH (ref 0.0–0.2)

## 2020-02-19 LAB — CULTURE, BLOOD (ROUTINE X 2)
Culture: NO GROWTH
Special Requests: ADEQUATE

## 2020-02-19 LAB — GLUCOSE, CAPILLARY
Glucose-Capillary: 186 mg/dL — ABNORMAL HIGH (ref 70–99)
Glucose-Capillary: 138 mg/dL — ABNORMAL HIGH (ref 70–99)
Glucose-Capillary: 197 mg/dL — ABNORMAL HIGH (ref 70–99)
Glucose-Capillary: 338 mg/dL — ABNORMAL HIGH (ref 70–99)

## 2020-02-19 LAB — C-REACTIVE PROTEIN: CRP: 6.8 mg/dL — ABNORMAL HIGH (ref <1.0)

## 2020-02-19 LAB — FERRITIN: Ferritin: 4405 ng/mL — ABNORMAL HIGH (ref 11–307)

## 2020-02-19 MED ORDER — INSULIN GLARGINE 100 UNIT/ML ~~LOC~~ SOLN
25.0000 [IU] | Freq: Two times a day (BID) | SUBCUTANEOUS | Status: DC
  Administered 2020-02-19 – 2020-02-23 (×8): 25 [IU] via SUBCUTANEOUS
  Filled 2020-02-19 (×9): qty 0.25

## 2020-02-19 MED ORDER — SODIUM CHLORIDE 0.9 % IV SOLN
100.0000 mg | Freq: Every day | INTRAVENOUS | Status: AC
  Administered 2020-02-19: 100 mg via INTRAVENOUS
  Filled 2020-02-19: qty 20

## 2020-02-19 NOTE — Progress Notes (Addendum)
   Subjective: Patient reports feeling well this morning.  She states her breathing has improved.  States she had HD late last night so did not sleep well.  Denies any headaches, chest pain or abdominal pain.  Continues to have diarrhea every few hours.  Objective:  Vital signs in last 24 hours: Vitals:   02/19/20 0300 02/19/20 0330 02/19/20 0340 02/19/20 0400  BP: 92/62 97/60 (!) 107/59 104/62  Pulse: 68 71 69 77  Resp: 19 20 19 20   Temp:    98.2 F (36.8 C)  TempSrc:    Oral  SpO2: 93% 90% 92% 92%  Weight:      Height:        Supplemental Oxygen: 92% on 15L HFNC  Constitution: NAD, appears stated age Cardio: RRR, no m/r/g, no LE edema  Respiratory: CTA, no w/r/r Abdominal: NTTP, soft, non-distended MSK: moving all extremities Neuro: normal affect, a&ox3 Skin: c/d/i   Assessment/Plan:  Principal Problem:   COVID-19 Active Problems:   ESRD on dialysis ([place])   Diabetes mellitus type 2 in obese (HCC)   OSA (obstructive sleep apnea)   86 year oldfemalewith ESRDon hemodialysis and chronic lung disease on chronic supplemental oxygen at 3 L/min being admitted with acute on chronic hypoxic respiratory failure due to COVID-19 pneumonia.  [place]. Saturating 92% on 15 L high flow nasal cannula.  Inflammatory markers trending down.  Unclear if patient received fifth dose at of remdesivir yesterday; spoke with pharmacy to clarify, will order the fifth dose if it was not given.  Will continue dexamethasone and encourage pulmonary rehab.  - tussionex q12h prn, robitussen q4h prn - remdesivir last dose today -dexamethasoneday5 - one dose of tocilizumab on 9/15 - IS, flutter valve  Diabetes Blood glucose186.  Increase Lantus to 25 units twice daily. - Lantus 25units bid - Insulin aspart 15 units with meals TID  - SSI  ESRD on HD Appears euvolemic.  Patient last dialyzed 9/19.  - Nephrology on board, appreciate  consult  Hematochezia Hemoglobin 8.7, stable.  No more reported episodes of bloody bowel movement.  Vitals stable.    - continue DVT prophylaxis, will hold if blood stools continue or other convincing evidence of bleeding   KAJ:GOTLXBW IOM:BTDH Diet:Heart/renal Code:Full code  Dispo: Anticipated dischargepending clinical improvement.   Nick Armel N, DO 02/19/2020, 8:00 AM Pager: [phone] After 5pm on weekdays and 1pm on weekends: On Call Pager: [phone]

## 2020-02-19 NOTE — Progress Notes (Signed)
°   02/19/20 0400  Hand-Off documentation  Handoff Given Given to shift RN/LPN  Report given to (Full Name) Jeral Pinch, RN  Handoff Received Received from shift RN/LPN  Report received from (Full Name) Iyah Laguna, RN  Vital Signs  Temp 98.2 F (36.8 C)  Temp Source Oral  Pulse Rate 77  Pulse Rate Source Monitor  Resp 20  BP 104/62  BP Location Right Arm  BP Method Automatic  Patient Position (if appropriate) Lying  Oxygen Therapy  SpO2 92 %  O2 Device Non-rebreather Mask  O2 Flow Rate (L/min) 15 L/min  Patient Activity (if Appropriate) In bed  Pulse Oximetry Type Continuous  Pain Assessment  Pain Scale 0-10  Pain Score 0  Post-Hemodialysis Assessment  Rinseback Volume (mL) 250 mL  KECN 254 V  Dialyzer Clearance Lightly streaked  Duration of HD Treatment -hour(s) 3.5 hour(s)  Hemodialysis Intake (mL) 700 mL  UF Total -Machine (mL) 822 mL  Net UF (mL) 122 mL  Tolerated HD Treatment Yes  Post-Hemodialysis Comments tx achieved as expected, well tolerated, denies pain, SOB, coughing, BP low at times.  AVG/AVF Arterial Site Held (minutes) 10 minutes  AVG/AVF Venous Site Held (minutes) 10 minutes  Education / Care Plan  Dialysis Education Provided No (Comment)  Fistula / Graft Left Upper arm Arteriovenous vein graft  No Placement Date or Time found.   Placed prior to admission: Yes  Orientation: Left  Access Location: Upper arm  Access Type: Arteriovenous vein graft  Site Condition No complications  Fistula / Graft Assessment Present;[place]rill;Bruit  Status Deaccessed  Drainage Description None

## 2020-02-19 NOTE — Progress Notes (Signed)
Nephrology Follow-Up Consult note   Assessment/Recommendations: Wanda Ramirez is a/an 49 y.o. female with a past medical history significant for ESRD on HD admitted with acute on chronic hypoxic respiratory failure 2/2 covid.   # ESRD: TTS, BFR 450, DFR 800, k 2, na 137, ca 2.25, bicarb 35, f 180.  Continue hemodialysis per schedule next on tuesday # Volume/ hypertension: EDW 111kg but down to 108 kg this admission.  Attempting to achieve 108 kg as tolerated.  Monitor for further hypotension # Anemia of Chronic Kidney Disease: Hemoglobin  8.7. Currently receiving aranesp 47mcg weekly outpt but will continue here at higher dose of 159mcg qtuesday.  # Secondary Hyperparathyroidism/Hyperphosphatemia:  Continue increased dose of Auryxia to 840 3 times daily.  Continue Sensipar 60 mg every other day # Vascular access: AVF with no issues # COVID infection/Acute on chronic hypoxic respiratory failure: Oxygen requirement persistent.  On dexamethasone, status post tocilizumab and remdesivir.  Continue therapy per primary team. #DM2 uncontrolled with hyperglycemia: Hyperglycemia likely steroids contributing.  Management per primary team  # Additional recommendations: - Dose all meds for creatinine clearance <10 ml/min  - Unless absolutely necessary, no MRIs with gadolinium.  - Implement save arm precautions. Prefer needle sticks in the dorsum of the hands or wrists. No blood pressure measurements in arm. - If blood transfusion is requested during hemodialysis sessions, please alert [place] prior to the session.    Recommendations conveyed to primary service.    [facility] 02/19/2020 8:33 AM  ___________________________________________________________  CC: esrd  Interval History/Subjective: Patient not seen in person today and information was gleaned from chart and care team. Relatively unchanged respiratory status over past 24 hours. Tolerated HD w/ 800cc of  volume removal. Further volume removal has been limited by hypotension.    Medications:  Current Facility-Administered Medications  Medication Dose Route Frequency Provider Last Rate Last Admin  . 0.9 %  sodium chloride infusion  100 mL Intravenous PRN Reesa Chew, MD      . 0.9 %  sodium chloride infusion  100 mL Intravenous PRN Reesa Chew, MD      . acetaminophen (TYLENOL) tablet 650 mg  650 mg Oral Q6H PRN Asencion Noble, MD   650 mg at 02/16/20 1856   Or  . acetaminophen (TYLENOL) suppository 650 mg  650 mg Rectal Q6H PRN Asencion Noble, MD      . acetaminophen (TYLENOL) tablet 650 mg  650 mg Oral Once Sherry Ruffing, Marissa M, MD      . albuterol (VENTOLIN HFA) 108 (90 Base) MCG/ACT inhaler 2 puff  2 puff Inhalation Q4H PRN Asencion Noble, MD   2 puff at 02/14/20 0900  . Chlorhexidine Gluconate Cloth 2 % PADS 6 each  6 each Topical Q0600 Reesa Chew, MD   6 each at 02/19/20 [phone]  . cinacalcet (SENSIPAR) tablet 60 mg  60 mg Oral Silvestre Gunner, MD   60 mg at 02/18/20 1950  . Darbepoetin Alfa (ARANESP) injection 100 mcg  100 mcg Intravenous Q Tue-HD Reesa Chew, MD   100 mcg at 02/14/20 1605  . dexamethasone (DECADRON) injection 6 mg  6 mg Intravenous Daily Asencion Noble, MD   6 mg at 02/18/20 9326  . ferric citrate (AURYXIA) tablet 840 mg  840 mg Oral TID WC Reesa Chew, MD   840 mg at 02/19/20 0743  . guaiFENesin-dextromethorphan (ROBITUSSIN DM) 100-10 MG/5ML syrup 10 mL  10 mL  Oral Q4H PRN Asencion Noble, MD   10 mL at 02/19/20 0753  . heparin injection 1,000 Units  1,000 Units Dialysis PRN Reesa Chew, MD      . heparin injection 5,000 Units  5,000 Units Subcutaneous Q8H Axel Filler, MD   5,000 Units at 02/19/20 0549  . heparin sodium (porcine) 1000 UNIT/ML injection           . [START ON 02/20/2020] influenza vac split quadrivalent PF (FLUARIX) injection 0.5 mL  0.5 mL Intramuscular Prior to discharge Axel Filler, MD      . insulin aspart (novoLOG) injection 0-20 Units  0-20 Units Subcutaneous TID WC Rehman, Areeg N, DO   2 Units at 02/19/20 0802  . insulin aspart (novoLOG) injection 15 Units  15 Units Subcutaneous TID WC Rehman, Areeg N, DO   15 Units at 02/19/20 0802  . insulin glargine (LANTUS) injection 20 Units  20 Units Subcutaneous BID Rehman, Areeg N, DO   20 Units at 02/18/20 2138  . levothyroxine (SYNTHROID) tablet 125 mcg  125 mcg Oral QAC breakfast Asencion Noble, MD   125 mcg at 02/19/20 0549  . lidocaine (PF) (XYLOCAINE) 1 % injection 5 mL  5 mL Intradermal PRN Reesa Chew, MD      . lidocaine-prilocaine (EMLA) cream 1 application  1 application Topical PRN Reesa Chew, MD      . pantoprazole (PROTONIX) EC tablet 80 mg  80 mg Oral Daily Asencion Noble, MD   80 mg at 02/18/20 0837  . pentafluoroprop-tetrafluoroeth (GEBAUERS) aerosol 1 application  1 application Topical PRN Reesa Chew, MD      . pregabalin (LYRICA) capsule 75 mg  75 mg Oral Daily Asencion Noble, MD   75 mg at 02/18/20 7591  . umeclidinium bromide (INCRUSE ELLIPTA) 62.5 MCG/INH 1 puff  1 puff Inhalation Daily Axel Filler, MD   1 puff at 02/19/20 0743      Physical Exam: Vitals:   02/19/20 0400 02/19/20 0802  BP: 104/62 109/70  Pulse: 77 73  Resp: 20 20  Temp: 98.2 F (36.8 C) 98.6 F (37 C)  SpO2: 92% 90%   No intake/output data recorded.  Intake/Output Summary (Last 24 hours) at 02/19/2020 0833 Last data filed at 02/19/2020 0400 Gross per 24 hour  Intake --  Output 122 ml  Net -122 ml   Exam not performed today   Test Results I personally reviewed new and old clinical labs and radiology tests Lab Results  Component Value Date   NA 137 02/18/2020   K 3.8 02/18/2020   CL 96 (L) 02/18/2020   CO2 21 (L) 02/18/2020   BUN 96 (H) 02/18/2020   CREATININE 11.15 (H) 02/18/2020   CALCIUM 9.1 02/18/2020   ALBUMIN 2.6 (L) 02/18/2020   PHOS 7.1 (H) 02/18/2020

## 2020-02-20 DIAGNOSIS — J159 Unspecified bacterial pneumonia: Secondary | ICD-10-CM

## 2020-02-20 DIAGNOSIS — J189 Pneumonia, unspecified organism: Secondary | ICD-10-CM

## 2020-02-20 LAB — COMPREHENSIVE METABOLIC PANEL
ALT: 23 U/L (ref 0–44)
AST: 36 U/L (ref 15–41)
Albumin: 2.5 g/dL — ABNORMAL LOW (ref 3.5–5.0)
Alkaline Phosphatase: 140 U/L — ABNORMAL HIGH (ref 38–126)
Anion gap: 15 (ref 5–15)
BUN: 68 mg/dL — ABNORMAL HIGH (ref 6–20)
CO2: 25 mmol/L (ref 22–32)
Calcium: 8.7 mg/dL — ABNORMAL LOW (ref 8.9–10.3)
Chloride: 98 mmol/L (ref 98–111)
Creatinine, Ser: 9.67 mg/dL — ABNORMAL HIGH (ref 0.44–1.00)
GFR calc Af Amer: 5 mL/min — ABNORMAL LOW (ref >60)
GFR calc non Af Amer: 4 mL/min — ABNORMAL LOW (ref >60)
Glucose, Bld: 172 mg/dL — ABNORMAL HIGH (ref 70–99)
Potassium: 3.2 mmol/L — ABNORMAL LOW (ref 3.5–5.1)
Sodium: 138 mmol/L (ref 135–145)
Total Bilirubin: 0.8 mg/dL (ref 0.3–1.2)
Total Protein: 6.3 g/dL — ABNORMAL LOW (ref 6.5–8.1)

## 2020-02-20 LAB — CBC WITH DIFFERENTIAL/PLATELET
Abs Immature Granulocytes: 1.05 10*3/uL — ABNORMAL HIGH (ref 0.00–0.07)
Basophils Absolute: 0 10*3/uL (ref 0.0–0.1)
Basophils Relative: 0 %
Eosinophils Absolute: 0.5 10*3/uL (ref 0.0–0.5)
Eosinophils Relative: 3 %
HCT: 28.2 % — ABNORMAL LOW (ref 36.0–46.0)
Hemoglobin: 9 g/dL — ABNORMAL LOW (ref 12.0–15.0)
Immature Granulocytes: 6 %
Lymphocytes Relative: 7 %
Lymphs Abs: 1.3 10*3/uL (ref 0.7–4.0)
MCH: 28 pg (ref 26.0–34.0)
MCHC: 31.9 g/dL (ref 30.0–36.0)
MCV: 87.9 fL (ref 80.0–100.0)
Monocytes Absolute: 0.4 10*3/uL (ref 0.1–1.0)
Monocytes Relative: 2 %
Neutro Abs: 14 10*3/uL — ABNORMAL HIGH (ref 1.7–7.7)
Neutrophils Relative %: 82 %
Platelets: 402 10*3/uL — ABNORMAL HIGH (ref 150–400)
RBC: 3.21 MIL/uL — ABNORMAL LOW (ref 3.87–5.11)
RDW: 15.9 % — ABNORMAL HIGH (ref 11.5–15.5)
WBC: 17.3 10*3/uL — ABNORMAL HIGH (ref 4.0–10.5)
nRBC: 0.9 % — ABNORMAL HIGH (ref 0.0–0.2)

## 2020-02-20 LAB — GLUCOSE, CAPILLARY
Glucose-Capillary: 174 mg/dL — ABNORMAL HIGH (ref 70–99)
Glucose-Capillary: 147 mg/dL — ABNORMAL HIGH (ref 70–99)
Glucose-Capillary: 87 mg/dL (ref 70–99)
Glucose-Capillary: 122 mg/dL — ABNORMAL HIGH (ref 70–99)

## 2020-02-20 LAB — PROCALCITONIN: Procalcitonin: 1.9 ng/mL

## 2020-02-20 LAB — MAGNESIUM: Magnesium: 2.2 mg/dL (ref 1.7–2.4)

## 2020-02-20 LAB — PHOSPHORUS: Phosphorus: 6 mg/dL — ABNORMAL HIGH (ref 2.5–4.6)

## 2020-02-20 MED ORDER — AZITHROMYCIN 250 MG PO TABS
250.0000 mg | ORAL_TABLET | Freq: Every day | ORAL | Status: DC
  Administered 2020-02-21 – 2020-02-23 (×3): 250 mg via ORAL
  Filled 2020-02-20 (×5): qty 1

## 2020-02-20 MED ORDER — HYDROCORTISONE (PERIANAL) 2.5 % EX CREA
TOPICAL_CREAM | Freq: Two times a day (BID) | CUTANEOUS | Status: AC
  Administered 2020-02-25: 1 via RECTAL
  Filled 2020-02-20 (×4): qty 28.35

## 2020-02-20 MED ORDER — SODIUM CHLORIDE 0.9 % IV SOLN
2.0000 g | INTRAVENOUS | Status: DC
  Administered 2020-02-21 – 2020-02-23 (×2): 2 g via INTRAVENOUS
  Filled 2020-02-20 (×2): qty 2

## 2020-02-20 MED ORDER — SODIUM CHLORIDE 0.9 % IV SOLN
1.0000 g | INTRAVENOUS | Status: AC
  Administered 2020-02-20: 1 g via INTRAVENOUS
  Filled 2020-02-20 (×2): qty 1

## 2020-02-20 MED ORDER — AZITHROMYCIN 250 MG PO TABS
500.0000 mg | ORAL_TABLET | Freq: Every day | ORAL | Status: AC
  Administered 2020-02-20: 500 mg via ORAL
  Filled 2020-02-20: qty 2

## 2020-02-20 NOTE — Progress Notes (Signed)
Nephrology Follow-Up Consult note   Assessment/Recommendations: CESIA ORF is a/an 49 y.o. female with a past medical history significant for ESRD on HD admitted with acute on chronic hypoxic respiratory failure 2/2 covid.   Problems: # ESRD: TTS,  Continue hemodialysis per schedule. Next HD tomorrow.   # Volume/ hypertension: EDW 111kg but down to 108 kg this admission.  Attempting to achieve 108 kg as tolerated.  Monitor for further hypotension  # Anemia of Chronic Kidney Disease: Hemoglobin  8.7. Currently receiving aranesp 73mcg weekly outpt but will continue here at higher dose of 177mcg qtuesday.   # Secondary Hyperparathyroidism/Hyperphosphatemia:  Continue increased dose of Auryxia to 840 3 times daily.  Continue Sensipar 60 mg every other day  # Vascular access: AVF with no issues  # COVID infection/Acute on chronic hypoxic respiratory failure: Oxygen requirement persistent.  On dexamethasone, status post tocilizumab and remdesivir.  Continue therapy per primary team.  #DM2 uncontrolled with hyperglycemia: Hyperglycemia likely steroids contributing.  Management per primary team  # Additional recommendations: - Dose all meds for creatinine clearance <10 ml/min  - Unless absolutely necessary, no MRIs with gadolinium.  - Implement save arm precautions. Prefer needle sticks in the dorsum of the hands or wrists. No blood pressure measurements in arm. - If blood transfusion is requested during hemodialysis sessions, please alert [place] prior to the session.    [facility] 02/20/2020 5:08 PM  _________________________________________________________  Interval History/Subjective: seen in room, coughing and still SOB on FM O2. HD tomorrow.    OP HD: BFR 450, DFR 800, k 2, na 137, ca 2.25, bicarb 35, f 180, edw 111kg  Exam:  alert, nad , obese , FM O2, not struggling, sig coughing spells  no jvd  Chest scattered bilat crackles  Cor reg  no RG  Abd soft ntnd no ascites   Ext no LE edema or UE edema   Alert, NF, ox3   AVF +bruit  Medications:  Current Facility-Administered Medications  Medication Dose Route Frequency Provider Last Rate Last Admin  . 0.9 %  sodium chloride infusion  100 mL Intravenous PRN Reesa Chew, MD      . 0.9 %  sodium chloride infusion  100 mL Intravenous PRN Reesa Chew, MD      . acetaminophen (TYLENOL) tablet 650 mg  650 mg Oral Q6H PRN Asencion Noble, MD   650 mg at 02/16/20 1856   Or  . acetaminophen (TYLENOL) suppository 650 mg  650 mg Rectal Q6H PRN Asencion Noble, MD      . acetaminophen (TYLENOL) tablet 650 mg  650 mg Oral Once Sherry Ruffing, Marissa M, MD      . albuterol (VENTOLIN HFA) 108 (90 Base) MCG/ACT inhaler 2 puff  2 puff Inhalation Q4H PRN Asencion Noble, MD   2 puff at 02/14/20 0900  . azithromycin (ZITHROMAX) tablet 500 mg  500 mg Oral Daily Seawell, Jaimie A, DO       Followed by  . [START ON 02/21/2020] azithromycin (ZITHROMAX) tablet 250 mg  250 mg Oral q1800 Seawell, Jaimie A, DO      . Chlorhexidine Gluconate Cloth 2 % PADS 6 each  6 each Topical Q0600 Reesa Chew, MD   6 each at 02/20/20 [phone]  . cinacalcet (SENSIPAR) tablet 60 mg  60 mg Oral Silvestre Gunner, MD   60 mg at 02/20/20 0944  . Darbepoetin Alfa (ARANESP) injection 100 mcg  100 mcg  Intravenous Q Tue-HD Reesa Chew, MD   100 mcg at 02/14/20 1605  . dexamethasone (DECADRON) injection 6 mg  6 mg Intravenous Daily Lonia Skinner M, MD   6 mg at 02/20/20 0930  . ferric citrate (AURYXIA) tablet 840 mg  840 mg Oral TID WC Reesa Chew, MD   840 mg at 02/20/20 0944  . guaiFENesin-dextromethorphan (ROBITUSSIN DM) 100-10 MG/5ML syrup 10 mL  10 mL Oral Q4H PRN Asencion Noble, MD   10 mL at 02/20/20 0948  . heparin injection 1,000 Units  1,000 Units Dialysis PRN Reesa Chew, MD      . heparin injection 5,000 Units  5,000 Units Subcutaneous Q8H Axel Filler, MD    5,000 Units at 02/20/20 1315  . hydrocortisone (ANUSOL-HC) 2.5 % rectal cream   Rectal BID Seawell, Jaimie A, DO      . influenza vac split quadrivalent PF (FLUARIX) injection 0.5 mL  0.5 mL Intramuscular Prior to discharge Axel Filler, MD      . insulin aspart (novoLOG) injection 0-20 Units  0-20 Units Subcutaneous TID WC Rehman, Areeg N, DO   1 Units at 02/20/20 1315  . insulin aspart (novoLOG) injection 15 Units  15 Units Subcutaneous TID WC Rehman, Areeg N, DO   15 Units at 02/20/20 1315  . insulin glargine (LANTUS) injection 25 Units  25 Units Subcutaneous BID Rehman, Areeg N, DO   25 Units at 02/20/20 0929  . levothyroxine (SYNTHROID) tablet 125 mcg  125 mcg Oral QAC breakfast Asencion Noble, MD   125 mcg at 02/20/20 2458  . lidocaine (PF) (XYLOCAINE) 1 % injection 5 mL  5 mL Intradermal PRN Reesa Chew, MD      . lidocaine-prilocaine (EMLA) cream 1 application  1 application Topical PRN Reesa Chew, MD      . pantoprazole (PROTONIX) EC tablet 80 mg  80 mg Oral Daily Asencion Noble, MD   80 mg at 02/20/20 0930  . pentafluoroprop-tetrafluoroeth (GEBAUERS) aerosol 1 application  1 application Topical PRN Reesa Chew, MD      . pregabalin (LYRICA) capsule 75 mg  75 mg Oral Daily Sherry Ruffing, Marissa M, MD   75 mg at 02/20/20 0930  . umeclidinium bromide (INCRUSE ELLIPTA) 62.5 MCG/INH 1 puff  1 puff Inhalation Daily Axel Filler, MD   1 puff at 02/20/20 0922      Physical Exam: Vitals:   02/20/20 0901 02/20/20 1706  BP: (!) 148/65 138/71  Pulse: 70 72  Resp: 16 16  Temp:    SpO2: 95% 100%   No intake/output data recorded. No intake or output data in the 24 hours ending 02/20/20 1708 Exam not performed today   Test Results I personally reviewed new and old clinical labs and radiology tests Lab Results  Component Value Date   NA 138 02/20/2020   K 3.2 (L) 02/20/2020   CL 98 02/20/2020   CO2 25 02/20/2020   BUN 68 (H) 02/20/2020    CREATININE 9.67 (H) 02/20/2020   CALCIUM 8.7 (L) 02/20/2020   ALBUMIN 2.5 (L) 02/20/2020   PHOS 6.0 (H) 02/20/2020

## 2020-02-20 NOTE — Progress Notes (Signed)
Pharmacy Antibiotic Note  Wanda Ramirez is a 49 y.o. female admitted on 02/13/2020 with COVID and now with concern for superimposed PNA. Pharmacy has been consulted for Cefepime dosing.  The patient is ESRD-TTS, with next HD planned for Tues, 9/21.   Plan: - Cefepime 1g IV x 1 now - Start Cefepime 2g on TTS @ 1800 starting on 9/21 - Will continue to follow HD schedule/duration, culture results, LOT, and antibiotic de-escalation plans   Height: 5\' 2"  (157.5 cm) Weight: 109.4 kg (241 lb 2.9 oz) IBW/kg (Calculated) : 50.1  Temp (24hrs), Avg:98 F (36.7 C), Min:98 F (36.7 C), Max:98 F (36.7 C)  Recent Labs  Lab 02/13/20 1901 02/13/20 1901 02/14/20 0833 02/14/20 1216 02/14/20 1216 02/15/20 0500 02/15/20 0500 02/16/20 0122 02/16/20 1209 02/17/20 0041 02/17/20 1715 02/18/20 0140 02/19/20 0445 02/20/20 0900  WBC 10.0   < >  --  8.8   < > 12.6*  --  11.0*  --   --  12.3* 14.5* 18.9*  --   CREATININE 13.55*   < >  --  16.42*   < > 9.33*   < > 11.83* 13.17* 7.90*  --  11.15*  --  9.67*  LATICACIDVEN 1.8  --  1.4 1.4  --   --   --   --   --   --   --   --   --   --    < > = values in this interval not displayed.    Estimated Creatinine Clearance: 8.2 mL/min (A) (by C-G formula based on SCr of 9.67 mg/dL (H)).    Allergies  Allergen Reactions  . Hydrocodone Itching and Nausea And Vomiting    Antimicrobials this admission: Remdesivir 9/14 >> 9/19 Azithromycin 9/20 >> Cefepime 9/20 >>  Dose adjustments this admission: n/a  Microbiology results: 9/13 BCx >> NGf  Thank you for allowing pharmacy to be a part of this patient's care.  Alycia Rossetti, PharmD, BCPS Clinical Pharmacist Clinical phone for 02/20/2020: [phone] 02/20/2020 5:39 PM   **Pharmacist phone directory can now be found on [email or web address] ([email or web address]).  Listed under [place].

## 2020-02-20 NOTE — Progress Notes (Addendum)
   Subjective:   She is feeling ok today although feels like her cough is slightly worse. She still makes urine but has not lower abdominal pain or dysuria. She has a small amount of nose bleed with HFNC.   Objective:  Vital signs in last 24 hours: Vitals:   02/19/20 0400 02/19/20 0802 02/19/20 1618 02/19/20 2141  BP: 104/62 109/70 109/66 114/67  Pulse: 77 73 75 68  Resp: 20 20 20 20   Temp: 98.2 F (36.8 C) 98.6 F (37 C) 97.6 F (36.4 C) 98 F (36.7 C)  TempSrc: Oral     SpO2: 92% 90% 100% 96%  Weight:      Height:        Supplemental Oxygen: 96% on 20L NRB+HFNC  Constitution: NAD, appears stated age HENT: small amount of dried blood around nares Cardio: RRR, no m/r/g, no LE edema  Respiratory: decreased breath sounds LL b/l, no w/r/r Abdominal: NTTP, soft, non-distended MSK: moving all extremities Neuro: normal affect, a&ox3, pleasant Skin: c/d/i   Assessment/Plan:  Principal Problem:   COVID-19 Active Problems:   ESRD on dialysis (HCC)   Diabetes mellitus type 2 in obese (HCC)   OSA (obstructive sleep apnea)   48 year oldfemalewith ESRDon hemodialysis and chronic lung disease on chronic supplemental oxygen at 3 L/min being admitted with acute on chronic hypoxic respiratory failure due to COVID-19 pneumonia.  [place]. Saturating 96% on 20L HFNC & NRB.  Inflammatory markers trending down. Leukocytosis trending upward the last few days with increase in cough symptoms.   - procal, trend CBC - tussionex q12h prn, robitussen q4h prn - completed remdesivir 9/19 -dexamethasoneday7 - one dose of tocilizumab on 9/15 - IS, flutter valve - trend inflammatory labs  Diabetes Lantus increased yesterday to 25U bid.   - continue Lantus 25units bid - Insulin aspart 15 units with meals TID  - SSI  ESRD on HD EDW 111kg, 108kg here leading to some softer blood pressures, now slightly hypertensive. HD again tomorrow.   - Nephrology on  board, appreciate consult  Hematochezia Likely 2/2 internal hemorrhoids vs. Small diverticular bleed. Did have external hemorrhoids on rectal exam but no blood. Also had diarrhea on admission which has now resolved. Hemoglobin has been stable. Recurrence of small amount of BRB today. Dark stools have been present since she started iron supplementation recently. Hemoglobin has remained stable. Per chart review, possibly had a colonoscopy in 2017, unable to see results.   - continue DVT prophylaxis for now with stable hemoglobin, trend CBC - discuss colonoscopy history, will need outpatient follow-up with GI - hold stool softeners, laxatives with recent diarrhea - hydrocortisone/lidocaine rectally no more than 7 days  VTE: heparin JIR:CVEL Diet:Heart/renal Code:Full code  Dispo: Anticipated dischargepending clinical improvement.   Giuseppina Quinones A, DO 02/20/2020, 7:35 AM Pager: [phone] After 5pm on weekdays and 1pm on weekends: On Call Pager: [phone]

## 2020-02-21 ENCOUNTER — Inpatient Hospital Stay (HOSPITAL_COMMUNITY): Payer: Medicaid Other

## 2020-02-21 DIAGNOSIS — N186 End stage renal disease: Secondary | ICD-10-CM

## 2020-02-21 LAB — RENAL FUNCTION PANEL
Albumin: 2.4 g/dL — ABNORMAL LOW (ref 3.5–5.0)
Anion gap: 16 — ABNORMAL HIGH (ref 5–15)
BUN: 88 mg/dL — ABNORMAL HIGH (ref 6–20)
CO2: 23 mmol/L (ref 22–32)
Calcium: 8.3 mg/dL — ABNORMAL LOW (ref 8.9–10.3)
Chloride: 98 mmol/L (ref 98–111)
Creatinine, Ser: 12.22 mg/dL — ABNORMAL HIGH (ref 0.44–1.00)
GFR calc Af Amer: 4 mL/min — ABNORMAL LOW (ref >60)
GFR calc non Af Amer: 3 mL/min — ABNORMAL LOW (ref >60)
Glucose, Bld: 236 mg/dL — ABNORMAL HIGH (ref 70–99)
Phosphorus: 7.1 mg/dL — ABNORMAL HIGH (ref 2.5–4.6)
Potassium: 4 mmol/L (ref 3.5–5.1)
Sodium: 137 mmol/L (ref 135–145)

## 2020-02-21 LAB — CBC WITH DIFFERENTIAL/PLATELET
Abs Immature Granulocytes: 1.23 10*3/uL — ABNORMAL HIGH (ref 0.00–0.07)
Basophils Absolute: 0 10*3/uL (ref 0.0–0.1)
Basophils Relative: 0 %
Eosinophils Absolute: 0.2 10*3/uL (ref 0.0–0.5)
Eosinophils Relative: 1 %
HCT: 24.8 % — ABNORMAL LOW (ref 36.0–46.0)
Hemoglobin: 8 g/dL — ABNORMAL LOW (ref 12.0–15.0)
Immature Granulocytes: 7 %
Lymphocytes Relative: 8 %
Lymphs Abs: 1.4 10*3/uL (ref 0.7–4.0)
MCH: 28.1 pg (ref 26.0–34.0)
MCHC: 32.3 g/dL (ref 30.0–36.0)
MCV: 87 fL (ref 80.0–100.0)
Monocytes Absolute: 0.5 10*3/uL (ref 0.1–1.0)
Monocytes Relative: 3 %
Neutro Abs: 13.4 10*3/uL — ABNORMAL HIGH (ref 1.7–7.7)
Neutrophils Relative %: 81 %
Platelets: 361 10*3/uL (ref 150–400)
RBC: 2.85 MIL/uL — ABNORMAL LOW (ref 3.87–5.11)
RDW: 15.9 % — ABNORMAL HIGH (ref 11.5–15.5)
WBC: 16.8 10*3/uL — ABNORMAL HIGH (ref 4.0–10.5)
nRBC: 0.5 % — ABNORMAL HIGH (ref 0.0–0.2)

## 2020-02-21 LAB — C-REACTIVE PROTEIN: CRP: 2.4 mg/dL — ABNORMAL HIGH (ref <1.0)

## 2020-02-21 LAB — GLUCOSE, CAPILLARY
Glucose-Capillary: 247 mg/dL — ABNORMAL HIGH (ref 70–99)
Glucose-Capillary: 164 mg/dL — ABNORMAL HIGH (ref 70–99)
Glucose-Capillary: 240 mg/dL — ABNORMAL HIGH (ref 70–99)
Glucose-Capillary: 310 mg/dL — ABNORMAL HIGH (ref 70–99)

## 2020-02-21 LAB — D-DIMER, QUANTITATIVE: D-Dimer, Quant: 10.48 ug/mL-FEU — ABNORMAL HIGH (ref 0.00–0.50)

## 2020-02-21 MED ORDER — CEFAZOLIN SODIUM-DEXTROSE 2-4 GM/100ML-% IV SOLN
2.0000 g | INTRAVENOUS | Status: AC
  Administered 2020-02-22: 2 g via INTRAVENOUS
  Filled 2020-02-21: qty 100

## 2020-02-21 MED ORDER — CEFAZOLIN SODIUM-DEXTROSE 1-4 GM/50ML-% IV SOLN: 1.0000 g | INTRAVENOUS | Status: DC

## 2020-02-21 MED ORDER — DARBEPOETIN ALFA 100 MCG/0.5ML IJ SOSY
PREFILLED_SYRINGE | INTRAMUSCULAR | Status: AC
  Filled 2020-02-21: qty 0.5

## 2020-02-21 MED ORDER — TECHNETIUM TO 99M ALBUMIN AGGREGATED
4.3000 | Freq: Once | INTRAVENOUS | Status: AC | PRN
  Administered 2020-02-21: 4.3 via INTRAVENOUS

## 2020-02-21 NOTE — Progress Notes (Signed)
Pt.'s AVF clotted with no Thrill and Bruit. Seen by Nephrologist and will be going back to her room.

## 2020-02-21 NOTE — Progress Notes (Signed)
RT NOTES: RT holding off on placing patient on heated high flow at this time. Pt on 15 L [place] and NRB mask sating 98%, patient in no distress.

## 2020-02-21 NOTE — Progress Notes (Addendum)
Nephrology Follow-Up Consult note  Summary: Wanda Ramirez is a/an 49 y.o. female with a past medical history significant for ESRD on HD admitted with acute on chronic hypoxic respiratory failure 2/2 covid.    Interval History/Subjective: seen on 5C in HD room.  Unable to access AVF , appears to be clotted. Pt states on HD 6 yrs, has had the AVF "unclogged a couple of times".  No pain.  Pt c/o stable cough and SOB.    OP HD: TTSH  ?time  400/800  2/2.25 bath  111kg  Hep ?  meds ?   Exam:  alert, nad , obese , FM O2, not struggling, sig coughing spells  no jvd  Chest scattered bilat crackles  Cor reg no RG  Abd soft ntnd no ascites   Ext no LE edema or UE edema   Alert, NF, ox3   LUA AVF - no bruit today, scars from prior plication present   Assessment/ Plan: # ESRD: TTS HD.  Unable to dialyze today due to clotted LUA AVF. We re-issue HD orders for tomorrow instead.   # Clotted LUA AVF: consulting IR for HD access.  May need TDC, await IR assessment.   # Volume/ hypertension: no vol ^/ edema on exam. 2kg under dry. BP's soft, home norvasc/ metoprolol are on hold.   # Anemia of Chronic Kidney Disease: Hemoglobin  8.7. Currently receiving aranesp 52mcg weekly outpt but will continue here at higher dose of 126mcg qtuesday.   # Secondary Hyperparathyroidism/Hyperphosphatemia:  Continue increased dose of Auryxia to 840 3 times daily.  Continue Sensipar 60 mg every other day  # COVID pna w/ acute on chronic hypoxic respiratory failure: Oxygen requirement persistent.  On dexamethasone, status post tocilizumab and remdesivir.  Continue therapy per primary team.  #DM2 uncontrolled with hyperglycemia: Hyperglycemia likely steroids contributing.  Management per primary team    [facility] 02/21/2020 11:38 AM  _________________________________________________________    Medications:  Current Facility-Administered Medications  Medication Dose  Route Frequency Provider Last Rate Last Admin  . acetaminophen (TYLENOL) tablet 650 mg  650 mg Oral Q6H PRN Asencion Noble, MD   650 mg at 02/16/20 1856   Or  . acetaminophen (TYLENOL) suppository 650 mg  650 mg Rectal Q6H PRN Asencion Noble, MD      . acetaminophen (TYLENOL) tablet 650 mg  650 mg Oral Once Sherry Ruffing, Marissa M, MD      . albuterol (VENTOLIN HFA) 108 (90 Base) MCG/ACT inhaler 2 puff  2 puff Inhalation Q4H PRN Asencion Noble, MD   2 puff at 02/14/20 0900  . azithromycin (ZITHROMAX) tablet 250 mg  250 mg Oral q1800 Seawell, Jaimie A, DO      . ceFEPIme (MAXIPIME) 2 g in sodium chloride 0.9 % 100 mL IVPB  2 g Intravenous Q T,Th,Sat-1800 Rolla Flatten, [facility]      . Chlorhexidine Gluconate Cloth 2 % PADS 6 each  6 each Topical Q0600 Reesa Chew, MD   6 each at 02/21/20 0630  . cinacalcet (SENSIPAR) tablet 60 mg  60 mg Oral Silvestre Gunner, MD   60 mg at 02/20/20 0944  . Darbepoetin Alfa (ARANESP) injection 100 mcg  100 mcg Intravenous Q Tue-HD Reesa Chew, MD   100 mcg at 02/14/20 1605  . dexamethasone (DECADRON) injection 6 mg  6 mg Intravenous Daily Asencion Noble, MD   6 mg at 02/21/20 0958  . ferric citrate (  AURYXIA) tablet 840 mg  840 mg Oral TID WC Reesa Chew, MD   840 mg at 02/21/20 0754  . guaiFENesin-dextromethorphan (ROBITUSSIN DM) 100-10 MG/5ML syrup 10 mL  10 mL Oral Q4H PRN Asencion Noble, MD   10 mL at 02/21/20 0958  . heparin injection 5,000 Units  5,000 Units Subcutaneous Q8H Axel Filler, MD   5,000 Units at 02/21/20 0629  . hydrocortisone (ANUSOL-HC) 2.5 % rectal cream   Rectal BID Seawell, Jaimie A, DO   Given at 02/21/20 0959  . influenza vac split quadrivalent PF (FLUARIX) injection 0.5 mL  0.5 mL Intramuscular Prior to discharge Axel Filler, MD      . insulin aspart (novoLOG) injection 0-20 Units  0-20 Units Subcutaneous TID WC Rehman, Areeg N, DO   3 Units at 02/21/20 0755  . insulin aspart  (novoLOG) injection 15 Units  15 Units Subcutaneous TID WC Rehman, Areeg N, DO   15 Units at 02/21/20 0755  . insulin glargine (LANTUS) injection 25 Units  25 Units Subcutaneous BID Rehman, Areeg N, DO   25 Units at 02/21/20 1003  . levothyroxine (SYNTHROID) tablet 125 mcg  125 mcg Oral QAC breakfast Asencion Noble, MD   125 mcg at 02/21/20 4650  . pantoprazole (PROTONIX) EC tablet 80 mg  80 mg Oral Daily Asencion Noble, MD   80 mg at 02/21/20 0958  . pregabalin (LYRICA) capsule 75 mg  75 mg Oral Daily Asencion Noble, MD   75 mg at 02/21/20 0958  . umeclidinium bromide (INCRUSE ELLIPTA) 62.5 MCG/INH 1 puff  1 puff Inhalation Daily Axel Filler, MD   1 puff at 02/21/20 0755      Physical Exam: Vitals:   02/21/20 0750 02/21/20 0804  BP:  (!) 99/58  Pulse:  64  Resp:    Temp:    SpO2: 96%    No intake/output data recorded. No intake or output data in the 24 hours ending 02/21/20 1138 Exam not performed today   Test Results I personally reviewed new and old clinical labs and radiology tests Lab Results  Component Value Date   NA 137 02/21/2020   K 4.0 02/21/2020   CL 98 02/21/2020   CO2 23 02/21/2020   BUN 88 (H) 02/21/2020   CREATININE 12.22 (H) 02/21/2020   CALCIUM 8.3 (L) 02/21/2020   ALBUMIN 2.4 (L) 02/21/2020   PHOS 7.1 (H) 02/21/2020

## 2020-02-21 NOTE — Progress Notes (Signed)
Pt requiring an extensive amount of O2 at this time, unable to be placed on cpap.

## 2020-02-21 NOTE — Progress Notes (Signed)
Left upper arm AVF duplex  has been completed. Refer to [facility] under chart review to view preliminary results.   02/21/2020  1:41 PM Vernona Peake, Bonnye Fava

## 2020-02-21 NOTE — Consult Note (Signed)
Chief Complaint: Patient was seen in consultation today for  Chief Complaint  Patient presents with  . Fever    Referring Physician(s): Dr. Jonnie Finner  Supervising Physician: Dr. Serafina Royals  Patient Status: [facility] - In-pt  History of Present Illness: Wanda Ramirez is a 49 y.o. female with a medical history significant for morbid obesity, DM, asthma (on chronic O2), and ESRD on hemodialysis via a left AVF. She presented to the dialysis center 02/13/20 but was subsequently sent to the ED for evaluation of a temp of 103.4. She also had worsening shortness of breath, generalized body aches and diarrhea. She was diagnosed with COVID pneumonia and admitted for acute on chronic respiratory failure. She is on a TTS dialysis schedule. Today's dialysis session unable to be initiated secondary to a malfunctioning AVF.  A duplex ultrasound of her fistula was performed today. Summary: Thrombosed left upper arm brachiocephalic AVF.   Interventional Radiology has been asked to evaluate this patient for a fistulogram with possible intervention, possible placement of a tunneled dialysis catheter.   Past Medical History:  Diagnosis Date  . Arthritis   . Asthma   . Chronic back pain   . Diabetes mellitus ([employer])   . ESRD (end stage renal disease) on dialysis (HCC)    TTS  . Essential hypertension   . GERD (gastroesophageal reflux disease)   . Glaucoma   . Hyperlipidemia   . Hypothyroidism   . Morbid obesity ([place])   . Peripheral neuropathy   . Sleep apnea    wears CPAP, does not know setting    Past Surgical History:  Procedure Laterality Date  . AV FISTULA PLACEMENT Left 04/2014  . CARDIAC CATHETERIZATION N/A 03/17/2016   Procedure: Left Heart Cath and Coronary Angiography;  Surgeon: [name], MD;  Location: [facility];  Service: Cardiovascular;  Laterality: N/A;  . KNEE SURGERY    . TUBAL LIGATION      Allergies: Hydrocodone  Medications: Prior to Admission  medications   Medication Sig Start Date End Date Taking? Authorizing Provider  acetaminophen (TYLENOL) 325 MG tablet Take 650 mg by mouth every 6 (six) hours as needed for mild pain.   Yes [provider]  albuterol (PROVENTIL HFA;VENTOLIN HFA) 108 (90 Base) MCG/ACT inhaler Inhale 2 puffs into the lungs every 6 (six) hours as needed for wheezing or shortness of breath.   Yes [provider]  amLODipine (NORVASC) 10 MG tablet Take 10 mg by mouth at bedtime.   Yes [provider]  aspirin EC 81 MG tablet Take 81 mg by mouth daily.   Yes [provider]  AURYXIA 1 GM 210 MG(Fe) tablet Take 210 mg by mouth 3 (three) times daily with meals.  12/18/17  Yes [provider]  benzonatate (TESSALON) 100 MG capsule Take 100 mg by mouth as needed for cough.    Yes [provider]  Brinzolamide-Brimonidine (SIMBRINZA) 1-0.2 % SUSP Place 2 drops into both eyes 3 (three) times daily.    Yes [provider]  calcium acetate (PHOSLO) 667 MG capsule Take 1,334 mg by mouth 3 (three) times daily. 10/26/17  Yes [provider]  cinacalcet (SENSIPAR) 90 MG tablet Take 90 mg by mouth daily.    Yes [provider]  glimepiride (AMARYL) 2 MG tablet Take 2 mg by mouth daily with supper.   Yes [provider]  glimepiride (AMARYL) 4 MG tablet Take 4 mg by mouth daily with breakfast.   Yes [provider]  insulin regular human CONCENTRATED (HUMULIN R) 500 UNIT/ML injection Inject 3-6 Units into the skin 2 (two) times daily with a meal. Takes 3 units QAM and 6 units QHS   Yes [provider]  levothyroxine (SYNTHROID, LEVOTHROID) 125 MCG tablet Take 125 mcg by mouth daily before breakfast.   Yes [provider]  liraglutide (VICTOZA) 18 MG/3ML SOPN Inject 1.8 mg into the skin daily.   Yes [provider]  loratadine (CLARITIN) 10 MG tablet Take 10 mg by mouth daily.   Yes [provider]   metoprolol succinate (TOPROL-XL) 50 MG 24 hr tablet Take 50 mg by mouth 2 (two) times daily. Take with or immediately following a meal.    Yes [provider]  Netarsudil Dimesylate (RHOPRESSA) 0.02 % SOLN Place 1 drop into both eyes daily.    Yes [provider]  omeprazole (PRILOSEC) 40 MG capsule Take 40 mg by mouth daily.   Yes [provider]  ondansetron (ZOFRAN) 4 MG tablet Take 4 mg by mouth daily.   Yes [provider]  Oxycodone HCl 10 MG TABS Take 10 mg by mouth 4 (four) times daily as needed for pain. 01/01/16  Yes [provider]  pregabalin (LYRICA) 75 MG capsule Take 1 capsule (75 mg total) by mouth daily. Take an additional 75mg  on Dialysis days 02/09/18  Yes Hongalgi, Lenis Dickinson, MD  tiotropium (SPIRIVA) 18 MCG inhalation capsule Place 18 mcg into inhaler and inhale daily.   Yes [provider]  Travoprost, BAK Free, (TRAVATAN) 0.004 % SOLN ophthalmic solution Place 1 drop into both eyes every morning.   Yes [provider]  famotidine (PEPCID) 40 MG tablet Take 40 mg by mouth daily.    [provider]     Family History  Problem Relation Age of Onset  . Diabetes Mother   . Hyperlipidemia Mother   . Kidney disease Father     Social History   Socioeconomic History  . Marital status: Single    Spouse name: Not on file  . Number of children: Not on file  . Years of education: Not on file  . Highest education level: Not on file  Occupational History  . Occupation: disabled  Tobacco Use  . Smoking status: Former Research scientist (life sciences)  . Smokeless tobacco: Never Used  . Tobacco comment: Smoked x 2 yrs  Vaping Use  . Vaping Use: Never used  Substance and Sexual Activity  . Alcohol use: No    Comment: years ago - has since quit  . Drug use: No  . Sexual activity: Not on file  Other Topics Concern  . Not on file  Social History Narrative   Pt lives alone in 2 story home (has 2 aides that occasionally stay the  night)   Has 4 children   11th grade education   Has never been gainfully employed   Social Determinants of Radio broadcast assistant Strain:   . Difficulty of Paying Living Expenses: Not on file  Food Insecurity:   . Worried About Charity fundraiser in the Last Year: Not on file  . Ran Out of Food in the Last Year: Not on file  Transportation Needs:   . Lack of Transportation (Medical): Not on file  . Lack of Transportation (Non-Medical): Not on file  Physical Activity:   . Days of Exercise per Week: Not on file  . Minutes of Exercise per Session: Not on file  Stress:   .  Feeling of Stress : Not on file  Social Connections:   . Frequency of Communication with Friends and Family: Not on file  . Frequency of Social Gatherings with Friends and Family: Not on file  . Attends Religious Services: Not on file  . Active Member of Clubs or Organizations: Not on file  . Attends Archivist Meetings: Not on file  . Marital Status: Not on file    Review of Systems: A 12 point ROS discussed and pertinent positives are indicated in the HPI above.  All other systems are negative.  Review of Systems  Constitutional: Positive for fatigue.  Respiratory: Positive for cough and shortness of breath.   Cardiovascular: Negative for chest pain.  Gastrointestinal: Negative for abdominal pain, diarrhea, nausea and vomiting.  Neurological: Negative for headaches.    Vital Signs: BP (!) 99/58   Pulse 64   Temp 97.8 F (36.6 C)   Resp 12   Ht 5\' 2"  (1.575 m)   Wt 241 lb 2.9 oz (109.4 kg)   SpO2 98%   BMI 44.11 kg/m   Physical Exam Constitutional:      General: She is not in acute distress.    Appearance: She is ill-appearing.  HENT:     Mouth/Throat:     Mouth: Mucous membranes are moist.     Pharynx: Oropharynx is clear.  Cardiovascular:     Rate and Rhythm: Normal rate and regular rhythm.     Pulses: Normal pulses.     Heart sounds: Normal heart sounds.     Comments:  Left upper arm AVF, negative for thrill and bruit.  Pulmonary:     Breath sounds: Examination of the right-upper field reveals decreased breath sounds and rhonchi. Examination of the left-upper field reveals decreased breath sounds and rhonchi. Examination of the right-lower field reveals decreased breath sounds. Examination of the left-lower field reveals decreased breath sounds. Decreased breath sounds and rhonchi present.     Comments: Patient is on nasal cannula and face mask, both at 25 liters.  Abdominal:     General: Bowel sounds are normal.     Palpations: Abdomen is soft.  Musculoskeletal:        General: Normal range of motion.  Skin:    General: Skin is warm and dry.  Neurological:     Mental Status: She is alert and oriented to person, place, and time.     Imaging: DG Chest Portable 1 View  Result Date: 02/13/2020 CLINICAL DATA:  Shortness of breath presents from dialysis center. Positive COVID on Thursday. Fever up to 103.4. EXAM: PORTABLE CHEST 1 VIEW COMPARISON:  Chest x-ray 07/13/2018. FINDINGS: The heart size and mediastinal contours are unchanged. Low lung volumes with extensive patchy airspace opacities bilaterally involving the majority of the lung parenchyma. No pulmonary edema. No pleural effusion. No pneumothorax. No acute osseous abnormality. IMPRESSION: Extensive bilateral patchy airspace opacities consistent with known COVID 19 infection. Electronically Signed   By: Iven Finn M.D.   On: 02/13/2020 22:25   VAS US DUPLEX DIALYSIS ACCESS (AVF, AVG)  Result Date: 02/21/2020 DIALYSIS ACCESS Reason for Exam: Unable to dialize today due to clotted LUA AVF. Access Type: Brachial-cephalic AVF. History: Revison left Upper Extremity Brachiocephalic AVF on 4/40/3474. Comparison Study: No piror study. Performing Technologist: Oda Cogan RDMS, RVT  Examination Guidelines: A complete evaluation includes B-mode imaging, spectral Doppler, color Doppler, and power Doppler as  needed of all accessible portions of each vessel. Unilateral testing is considered an integral  part of a complete examination. Limited examinations for reoccurring indications may be performed as noted.  Findings:  +------------+----------+-------------+----------+-------------------+ OUTFLOW VEINPSV (cm/s)Diameter (cm)Depth (cm)     Describe       +------------+----------+-------------+----------+-------------------+ Prox UA                                      partially-occlusive +------------+----------+-------------+----------+-------------------+ Mid UA                                            occluded       +------------+----------+-------------+----------+-------------------+ Dist UA                                           occluded       +------------+----------+-------------+----------+-------------------+ AC Fossa                                          occluded       +------------+----------+-------------+----------+-------------------+   Summary: Thrombosed left upper arm Brachiocephalic AVF.  *See table(s) above for measurements and observations.   --------------------------------------------------------------------------------   Preliminary     Labs:  CBC: Recent Labs    02/18/20 0140 02/19/20 0445 02/20/20 1944 02/21/20 0542  WBC 14.5* 18.9* 17.3* 16.8*  HGB 9.5* 8.7* 9.0* 8.0*  HCT 29.4* 26.8* 28.2* 24.8*  PLT 353 421* 402* 361    COAGS: Recent Labs    02/14/20 1216  INR 1.2    BMP: Recent Labs    02/17/20 0041 02/18/20 0140 02/20/20 0900 02/21/20 0542  NA 135 137 138 137  K 4.0 3.8 3.2* 4.0  CL 94* 96* 98 98  CO2 23 21* 25 23  GLUCOSE 338* 218* 172* 236*  BUN 63* 96* 68* 88*  CALCIUM 8.4* 9.1 8.7* 8.3*  CREATININE 7.90* 11.15* 9.67* 12.22*  GFRNONAA 5* 4* 4* 3*  GFRAA 6* 4* 5* 4*    LIVER FUNCTION TESTS: Recent Labs    02/16/20 1209 02/16/20 1209 02/17/20 0041 02/18/20 0140 02/20/20 0900 02/21/20 0542  BILITOT 0.9   --  0.8 0.5 0.8  --   AST 49*  --  52* 36 36  --   ALT 27  --  28 25 23   --   ALKPHOS 75  --  75 78 140*  --   PROT 6.8  --  6.7 6.5 6.3*  --   ALBUMIN 2.5*   < > 2.4* 2.6* 2.5* 2.4*   < > = values in this interval not displayed.    TUMOR MARKERS: No results for input(s): AFPTM, CEA, CA199, CHROMGRNA in the last 8760 hours.  Assessment and Plan:  ESRD; thrombosed left AVF: Zendaya L. Devos, 49 year old female, is tentatively scheduled to be seen 02/22/20 at the [facility] department for a fistulogram with possible intervention, possible placement of a tunneled dialysis catheter.  Risks and benefits discussed with the patient including, but not limited to bleeding, infection, vascular injury, pneumothorax which may require chest tube placement, air embolism or even death  All of the patient's questions were answered, patient is agreeable to proceed.  The  patient will be NPO after midnight, AM labs will be ordered.   Consent signed and in chart.    Thank you for this interesting consult.  I greatly enjoyed meeting ADIAH GUERECA and look forward to participating in their care.  A copy of this report was sent to the requesting provider on this date.  Electronically Signed: Soyla Dryer, AGACNP-BC [phone] 02/21/2020, 4:23 PM   I spent a total of 20 Minutes    in face to face in clinical consultation, greater than 50% of which was counseling/coordinating care for fistulogram with possible intervention, possible placement of a tunneled dialysis catheter.

## 2020-02-21 NOTE — Progress Notes (Addendum)
   Subjective:   She is feeling ok today, some shortness of breath and persistent cough. She had another bowel movement overnight without any BRB. She denies rectal pain or pain with BM. She denies dizziness or weakness.   Objective:  Vital signs in last 24 hours: Vitals:   02/20/20 0901 02/20/20 1706 02/20/20 2046 02/20/20 2052  BP: (!) 148/65 138/71 (!) 99/44   Pulse: 70 72 66 69  Resp: 16 16 20 18   Temp:   98.9 F (37.2 C)   TempSrc:      SpO2: 95% 100% 100% 98%  Weight:      Height:        Supplemental Oxygen: 94% on 15L NRB+HFNC - Not wearing NRB  Constitution: NAD, appears stated age Cardio: RRR, no m/r/g, no LE edema  Respiratory: decreased breath sounds LL b/l, no w/r/r Abdominal: NTTP, soft, non-distended MSK: moving all extremities Neuro: normal affect, a&ox3, pleasant Skin: c/d/i   Assessment/Plan:  Principal Problem:   COVID-19 Active Problems:   End stage renal disease on dialysis (HCC)   Diabetes mellitus type 2 in obese (HCC)   OSA (obstructive sleep apnea)   Bacterial pneumonia   25 year oldfemalewith ESRDon hemodialysis and chronic lung disease on chronic supplemental oxygen at 3 L/min being admitted with acute on chronic hypoxic respiratory failure due to COVID-19 pneumonia.  [place]. Saturating 96% on 15L on HFNC. Oxygen requirements improved compared to yesterday but Inflammatory markers more elevated today, D-dimer 10 with soft blood pressure although blood pressures have been fluctuating. Started on antibiotics yesterday with elevated procal and leukocytosis.   - v/q scan with elevated d-dimer and softer bp - cont. cefepime and azithromycin  - tussionex q12h prn, robitussen q4h prn - completed remdesivir 9/19 -dexamethasoneday8 - one dose of tocilizumab on 9/15 - IS, flutter valve - trend inflammatory labs  Diabetes  - continue Lantus 25units bid - Insulin aspart 15 units with meals TID  - SSI  ESRD on  HD EDW 111kg, 108kg here, intermittently having softer blood pressures. HD planned for today but AVF clotted. IR consulted and plan for HD tomorrow.   Hematochezia Likely 2/2 internal hemorrhoids vs. Small diverticular bleed. Did have external hemorrhoids on rectal exam but no blood. Also had diarrhea on admission which has now resolved. Hemoglobin has been stable. Recurrence of small amount of BRB yesterday with BM overnight and no blood. Dark stools have been present since she started iron supplementation recently.   - continue DVT prophylaxis for now with stable hemoglobin, trend CBC - hold stool softeners, laxatives with recent diarrhea - hydrocortisone/lidocaine rectally no more than 7 days  VTE: heparin ITG:PQDI Diet:Heart/renal Code:Full code  Dispo: Anticipated dischargepending clinical improvement.   Joelle Flessner A, DO 02/21/2020, 6:48 AM Pager: [phone] After 5pm on weekdays and 1pm on weekends: On Call Pager: [phone]

## 2020-02-21 NOTE — Progress Notes (Signed)
RT holding off on placing pt on heated highflow at this time. Pt is resting comfortably, vital signs stable satting 94%. RT will place pt on heated highflow if pt does become SOB/desatting. RN aware.

## 2020-02-22 ENCOUNTER — Inpatient Hospital Stay (HOSPITAL_COMMUNITY): Payer: Medicaid Other

## 2020-02-22 HISTORY — PX: IR US GUIDE VASC ACCESS LEFT: IMG2389

## 2020-02-22 HISTORY — PX: IR FLUORO GUIDE CV LINE LEFT: IMG2282

## 2020-02-22 LAB — CBC WITH DIFFERENTIAL/PLATELET
Abs Immature Granulocytes: 1.63 10*3/uL — ABNORMAL HIGH (ref 0.00–0.07)
Basophils Absolute: 0.1 10*3/uL (ref 0.0–0.1)
Basophils Relative: 0 %
Eosinophils Absolute: 0.2 10*3/uL (ref 0.0–0.5)
Eosinophils Relative: 1 %
HCT: 28.1 % — ABNORMAL LOW (ref 36.0–46.0)
Hemoglobin: 9 g/dL — ABNORMAL LOW (ref 12.0–15.0)
Immature Granulocytes: 9 %
Lymphocytes Relative: 7 %
Lymphs Abs: 1.3 10*3/uL (ref 0.7–4.0)
MCH: 28.3 pg (ref 26.0–34.0)
MCHC: 32 g/dL (ref 30.0–36.0)
MCV: 88.4 fL (ref 80.0–100.0)
Monocytes Absolute: 0.7 10*3/uL (ref 0.1–1.0)
Monocytes Relative: 4 %
Neutro Abs: 14.4 10*3/uL — ABNORMAL HIGH (ref 1.7–7.7)
Neutrophils Relative %: 79 %
Platelets: 400 10*3/uL (ref 150–400)
RBC: 3.18 MIL/uL — ABNORMAL LOW (ref 3.87–5.11)
RDW: 16.7 % — ABNORMAL HIGH (ref 11.5–15.5)
WBC: 18.3 10*3/uL — ABNORMAL HIGH (ref 4.0–10.5)
nRBC: 0.9 % — ABNORMAL HIGH (ref 0.0–0.2)

## 2020-02-22 LAB — C-REACTIVE PROTEIN: CRP: 3 mg/dL — ABNORMAL HIGH (ref <1.0)

## 2020-02-22 LAB — RENAL FUNCTION PANEL
Albumin: 2.9 g/dL — ABNORMAL LOW (ref 3.5–5.0)
Anion gap: 16 — ABNORMAL HIGH (ref 5–15)
BUN: 112 mg/dL — ABNORMAL HIGH (ref 6–20)
CO2: 22 mmol/L (ref 22–32)
Calcium: 8.9 mg/dL (ref 8.9–10.3)
Chloride: 96 mmol/L — ABNORMAL LOW (ref 98–111)
Creatinine, Ser: 13.85 mg/dL — ABNORMAL HIGH (ref 0.44–1.00)
GFR calc Af Amer: 3 mL/min — ABNORMAL LOW (ref >60)
GFR calc non Af Amer: 3 mL/min — ABNORMAL LOW (ref >60)
Glucose, Bld: 383 mg/dL — ABNORMAL HIGH (ref 70–99)
Phosphorus: 8.3 mg/dL — ABNORMAL HIGH (ref 2.5–4.6)
Potassium: 4.1 mmol/L (ref 3.5–5.1)
Sodium: 134 mmol/L — ABNORMAL LOW (ref 135–145)

## 2020-02-22 LAB — D-DIMER, QUANTITATIVE: D-Dimer, Quant: 10.24 ug/mL-FEU — ABNORMAL HIGH (ref 0.00–0.50)

## 2020-02-22 LAB — GLUCOSE, CAPILLARY
Glucose-Capillary: 273 mg/dL — ABNORMAL HIGH (ref 70–99)
Glucose-Capillary: 161 mg/dL — ABNORMAL HIGH (ref 70–99)
Glucose-Capillary: 70 mg/dL (ref 70–99)
Glucose-Capillary: 198 mg/dL — ABNORMAL HIGH (ref 70–99)

## 2020-02-22 MED ORDER — DARBEPOETIN ALFA 100 MCG/0.5ML IJ SOSY
PREFILLED_SYRINGE | INTRAMUSCULAR | Status: AC
  Filled 2020-02-22: qty 0.5

## 2020-02-22 MED ORDER — HEPARIN SODIUM (PORCINE) 1000 UNIT/ML IJ SOLN
INTRAMUSCULAR | Status: AC
  Filled 2020-02-22: qty 4

## 2020-02-22 MED ORDER — CEFAZOLIN SODIUM-DEXTROSE 2-4 GM/100ML-% IV SOLN
2.0000 g | Freq: Once | INTRAVENOUS | Status: AC
  Administered 2020-02-22: 2 g via INTRAVENOUS

## 2020-02-22 MED ORDER — HEPARIN SODIUM (PORCINE) 1000 UNIT/ML IJ SOLN
INTRAMUSCULAR | Status: AC
  Filled 2020-02-22: qty 1

## 2020-02-22 MED ORDER — CHLORHEXIDINE GLUCONATE 4 % EX LIQD
CUTANEOUS | Status: AC
  Filled 2020-02-22: qty 15

## 2020-02-22 MED ORDER — HEPARIN SODIUM (PORCINE) 1000 UNIT/ML IJ SOLN
INTRAMUSCULAR | Status: AC
  Administered 2020-02-23: 2000 [IU]
  Filled 2020-02-22: qty 2

## 2020-02-22 MED ORDER — FENTANYL CITRATE (PF) 100 MCG/2ML IJ SOLN
INTRAMUSCULAR | Status: AC
  Filled 2020-02-22: qty 2

## 2020-02-22 MED ORDER — HEPARIN SODIUM (PORCINE) 1000 UNIT/ML IJ SOLN
INTRAMUSCULAR | Status: AC
  Administered 2020-02-23: 3800 [IU]
  Filled 2020-02-22: qty 4

## 2020-02-22 MED ORDER — MIDAZOLAM HCL 2 MG/2ML IJ SOLN
INTRAMUSCULAR | Status: AC
  Filled 2020-02-22: qty 2

## 2020-02-22 MED ORDER — CEFAZOLIN SODIUM-DEXTROSE 2-4 GM/100ML-% IV SOLN
INTRAVENOUS | Status: AC
  Filled 2020-02-22: qty 100

## 2020-02-22 MED ORDER — LIDOCAINE-EPINEPHRINE 1 %-1:100000 IJ SOLN
INTRAMUSCULAR | Status: AC | PRN
  Administered 2020-02-22: 10 mL

## 2020-02-22 MED ORDER — LIDOCAINE-EPINEPHRINE 1 %-1:100000 IJ SOLN
INTRAMUSCULAR | Status: AC
  Filled 2020-02-22: qty 1

## 2020-02-22 NOTE — Progress Notes (Signed)
Nephrology Follow-Up Consult note  Summary: Wanda Ramirez is a/an 49 y.o. female with a past medical history significant for ESRD on HD admitted with acute on chronic hypoxic respiratory failure 2/2 covid.    Interval History/Subjective:  Patient not examined today directly given COVID-19 + status, utilizing data taken from chart +/- discussions w/ providers and staff.     OP HD: TTSH  ?time  400/800  2/2.25 bath  111kg  Hep ?  meds ?   Exam:  Patient not examined today directly given COVID-19 + status, utilizing data taken from chart +/- discussions w/ providers and staff.    Assessment/ Plan: # ESRD: TTS HD.  Unable to dialyze today due to clotted LUA AVF. Going to IR this afternoon and should be dialyzed overnight tonight.   # Clotted LUA AVF: consulting IR for HD access.  As above.   # Volume/ hypertension:. 2kg under dry. BP's soft, home norvasc/ metoprolol are on hold.   # Anemia of Chronic Kidney Disease: Hemoglobin  8.7. Currently receiving aranesp 50mcg weekly outpt but will continue here at higher dose of 175mcg qtuesday.   # Secondary Hyperparathyroidism/Hyperphosphatemia:  Continue increased dose of Auryxia to 840 3 times daily.  Continue Sensipar 60 mg every other day  # COVID pna w/ acute on chronic hypoxic respiratory failure: Oxygen requirement persistent.  On dexamethasone, status post tocilizumab and remdesivir.  Continue therapy per primary team.  #DM2 uncontrolled with hyperglycemia: Hyperglycemia likely steroids contributing.  Management per primary team    [facility] 02/22/2020 4:43 PM  _________________________________________________________    Medications:  Current Facility-Administered Medications  Medication Dose Route Frequency Provider Last Rate Last Admin  . fentaNYL (SUBLIMAZE) 100 MCG/2ML injection           . midazolam (VERSED) 2 MG/2ML injection           . acetaminophen (TYLENOL) tablet 650 mg  650  mg Oral Q6H PRN Asencion Noble, MD   650 mg at 02/16/20 1856   Or  . acetaminophen (TYLENOL) suppository 650 mg  650 mg Rectal Q6H PRN Asencion Noble, MD      . acetaminophen (TYLENOL) tablet 650 mg  650 mg Oral Once Sherry Ruffing, Marissa M, MD      . albuterol (VENTOLIN HFA) 108 (90 Base) MCG/ACT inhaler 2 puff  2 puff Inhalation Q4H PRN Asencion Noble, MD   2 puff at 02/14/20 0900  . azithromycin (ZITHROMAX) tablet 250 mg  250 mg Oral q1800 Seawell, Jaimie A, DO   250 mg at 02/21/20 1712  . ceFEPIme (MAXIPIME) 2 g in sodium chloride 0.9 % 100 mL IVPB  2 g Intravenous Q T,Th,Sat-1800 Rolla Flatten, RPH 200 mL/hr at 02/21/20 1658 2 g at 02/21/20 1658  . Chlorhexidine Gluconate Cloth 2 % PADS 6 each  6 each Topical Q0600 Reesa Chew, MD   6 each at 02/22/20 [phone]  . cinacalcet (SENSIPAR) tablet 60 mg  60 mg Oral Silvestre Gunner, MD   60 mg at 02/22/20 1011  . Darbepoetin Alfa (ARANESP) injection 100 mcg  100 mcg Intravenous Q Tue-HD Reesa Chew, MD   100 mcg at 02/14/20 1605  . dexamethasone (DECADRON) injection 6 mg  6 mg Intravenous Daily Asencion Noble, MD   6 mg at 02/22/20 1013  . ferric citrate (AURYXIA) tablet 840 mg  840 mg Oral TID WC Reesa Chew, MD   840 mg at 02/22/20 1432  .  guaiFENesin-dextromethorphan (ROBITUSSIN DM) 100-10 MG/5ML syrup 10 mL  10 mL Oral Q4H PRN Asencion Noble, MD   10 mL at 02/21/20 1929  . heparin injection 5,000 Units  5,000 Units Subcutaneous Q8H Axel Filler, MD   5,000 Units at 02/22/20 1433  . heparin sodium (porcine) 1000 UNIT/ML injection           . hydrocortisone (ANUSOL-HC) 2.5 % rectal cream   Rectal BID Seawell, Jaimie A, DO   Given at 02/21/20 2208  . influenza vac split quadrivalent PF (FLUARIX) injection 0.5 mL  0.5 mL Intramuscular Prior to discharge Axel Filler, MD      . insulin aspart (novoLOG) injection 0-20 Units  0-20 Units Subcutaneous TID WC Rehman, Areeg N, DO   2 Units at  02/22/20 1434  . insulin aspart (novoLOG) injection 15 Units  15 Units Subcutaneous TID WC Rehman, Areeg N, DO   15 Units at 02/22/20 1434  . insulin glargine (LANTUS) injection 25 Units  25 Units Subcutaneous BID Rehman, Areeg N, DO   25 Units at 02/22/20 1014  . levothyroxine (SYNTHROID) tablet 125 mcg  125 mcg Oral QAC breakfast Asencion Noble, MD   125 mcg at 02/22/20 0550  . pantoprazole (PROTONIX) EC tablet 80 mg  80 mg Oral Daily Asencion Noble, MD   80 mg at 02/22/20 1012  . pregabalin (LYRICA) capsule 75 mg  75 mg Oral Daily Lonia Skinner M, MD   75 mg at 02/22/20 1012  . umeclidinium bromide (INCRUSE ELLIPTA) 62.5 MCG/INH 1 puff  1 puff Inhalation Daily Axel Filler, MD   1 puff at 02/22/20 1015      Physical Exam: Vitals:   02/22/20 0857 02/22/20 1424  BP: (!) 100/59   Pulse: 74   Resp:    Temp: 97.9 F (36.6 C)   SpO2: 96% 97%   No intake/output data recorded.  Intake/Output Summary (Last 24 hours) at 02/22/2020 1643 Last data filed at 02/21/2020 1700 Gross per 24 hour  Intake 100 ml  Output --  Net 100 ml   Exam not performed today   Test Results I personally reviewed new and old clinical labs and radiology tests Lab Results  Component Value Date   NA 134 (L) 02/22/2020   K 4.1 02/22/2020   CL 96 (L) 02/22/2020   CO2 22 02/22/2020   BUN 112 (H) 02/22/2020   CREATININE 13.85 (H) 02/22/2020   CALCIUM 8.9 02/22/2020   ALBUMIN 2.9 (L) 02/22/2020   PHOS 8.3 (H) 02/22/2020

## 2020-02-22 NOTE — Progress Notes (Signed)
Pt unable to wear CPAP due to requiting high amount of 02 at this time.

## 2020-02-22 NOTE — Progress Notes (Signed)
   Subjective: Patient reports feeling about the same this morning.  She is continuing to have a productive cough.  States she is continuing to have bright red blood in her stool. She does not remember having this in the past. She does not recall the findings from her last colonoscopy. Denies any new symptoms.  Objective:  Vital signs in last 24 hours: Vitals:   02/21/20 1649 02/21/20 2054 02/21/20 2203 02/22/20 0400  BP: 117/89  (!) 109/57   Pulse: 68 76 70   Resp: 16 18 20    Temp: 98 F (36.7 C)  97.6 F (36.4 C)   TempSrc:      SpO2: 96% 96% 100% 99%  Weight:      Height:        Supplemental Oxygen: 100% on 15L HFNC + NRB  Constitution: NAD, appears stated age Cardio: RRR, no m/r/g, no LE edema  Respiratory: CTA, no w/r/r Abdominal: NTTP, soft, non-distended MSK: moving all extremities Neuro: normal affect, a&ox3 Skin: c/d/i   Assessment/Plan:  Principal Problem:   COVID-19 Active Problems:   End stage renal disease on dialysis (HCC)   Diabetes mellitus type 2 in obese (HCC)   OSA (obstructive sleep apnea)   Bacterial pneumonia  47 year oldfemalewith ESRDon hemodialysis and chronic lung disease on chronic supplemental oxygen at 3 L/min being admitted with acute on chronic hypoxic respiratory failure due to COVID-19 pneumonia.  [facility] day 9. Saturating 100% on 15L HFNC, will wean as tolerated. D-dimer and CRP remain elevated. V/Q scan was negative for PE. Continue antibiotics for possible superimposed bacterial infection given elevated procalcitonin and persistent leukocytosis.   - tussionex q12h prn, robitussen q4h prn - remdesivir completed 9/19 - dexamethasone day 9 - continue cefepime and azithromycin - IS, flutter valve  Diabetes - continue Lantus 25unitsbid - Insulin aspart 15units with meals TID  - SSI  ESRD on HD Last HD 9/19. AVF clotted. IR plan for fistulogram today with possible intervention and possible placement of a  tunneled dialysis catheter.   Hematochezia Continues to have blood BM. Hemoglobin remains stable. Suspect external hemorrhoids vs small diverticular bleed. Will continue to monitor.   - continue DVT prophylaxis for now with stable hemoglobin, trend CBC - hold stool softeners, laxatives with recent diarrhea - hydrocortisone/lidocaine rectally no more than 7 days   VTE: Heparin IVF: None Diet: Heart/renal Code: Full code  Dispo: Anticipated discharge pending clinical improvement.   Bernette Seeman N, DO 02/22/2020, 7:25 AM Pager: [phone] After 5pm on weekdays and 1pm on weekends: On Call Pager: [phone]

## 2020-02-22 NOTE — Procedures (Signed)
Pre-procedure Diagnosis: ESRD Post-procedure Diagnosis: Same  Successful placement of tunneled HD catheter with tips terminating within the superior aspect of the right atrium.    Complications: None Immediate EBL: Trace  The catheter is ready for immediate use.   Jay Seleta Hovland, MD Pager #: [phone]   

## 2020-02-23 LAB — GLUCOSE, CAPILLARY
Glucose-Capillary: 190 mg/dL — ABNORMAL HIGH (ref 70–99)
Glucose-Capillary: 145 mg/dL — ABNORMAL HIGH (ref 70–99)
Glucose-Capillary: 236 mg/dL — ABNORMAL HIGH (ref 70–99)
Glucose-Capillary: 130 mg/dL — ABNORMAL HIGH (ref 70–99)

## 2020-02-23 LAB — RENAL FUNCTION PANEL
Albumin: 2.3 g/dL — ABNORMAL LOW (ref 3.5–5.0)
Anion gap: 13 (ref 5–15)
BUN: 48 mg/dL — ABNORMAL HIGH (ref 6–20)
CO2: 24 mmol/L (ref 22–32)
Calcium: 7.5 mg/dL — ABNORMAL LOW (ref 8.9–10.3)
Chloride: 101 mmol/L (ref 98–111)
Creatinine, Ser: 7.22 mg/dL — ABNORMAL HIGH (ref 0.44–1.00)
GFR calc Af Amer: 7 mL/min — ABNORMAL LOW (ref >60)
GFR calc non Af Amer: 6 mL/min — ABNORMAL LOW (ref >60)
Glucose, Bld: 184 mg/dL — ABNORMAL HIGH (ref 70–99)
Phosphorus: 4.2 mg/dL (ref 2.5–4.6)
Potassium: 3.4 mmol/L — ABNORMAL LOW (ref 3.5–5.1)
Sodium: 138 mmol/L (ref 135–145)

## 2020-02-23 LAB — CBC
HCT: 23.3 % — ABNORMAL LOW (ref 36.0–46.0)
Hemoglobin: 7.6 g/dL — ABNORMAL LOW (ref 12.0–15.0)
MCH: 28.6 pg (ref 26.0–34.0)
MCHC: 32.6 g/dL (ref 30.0–36.0)
MCV: 87.6 fL (ref 80.0–100.0)
Platelets: 294 10*3/uL (ref 150–400)
RBC: 2.66 MIL/uL — ABNORMAL LOW (ref 3.87–5.11)
RDW: 16.9 % — ABNORMAL HIGH (ref 11.5–15.5)
WBC: 16.2 10*3/uL — ABNORMAL HIGH (ref 4.0–10.5)
nRBC: 1.5 % — ABNORMAL HIGH (ref 0.0–0.2)

## 2020-02-23 LAB — D-DIMER, QUANTITATIVE: D-Dimer, Quant: 12.68 ug/mL-FEU — ABNORMAL HIGH (ref 0.00–0.50)

## 2020-02-23 LAB — C-REACTIVE PROTEIN: CRP: 1.7 mg/dL — ABNORMAL HIGH (ref <1.0)

## 2020-02-23 MED ORDER — INSULIN GLARGINE 100 UNIT/ML ~~LOC~~ SOLN
30.0000 [IU] | Freq: Two times a day (BID) | SUBCUTANEOUS | Status: DC
  Administered 2020-02-23 – 2020-02-24 (×2): 30 [IU] via SUBCUTANEOUS
  Filled 2020-02-23 (×3): qty 0.3

## 2020-02-23 NOTE — Progress Notes (Signed)
Patient pulling O2 NRB mask off  desating below 90- 80% pt instructed need to calm down and breathe with mask on to relieve low level after pt calmed down O2 levels improved to 100% on NRB @ O215 LPM. Called 5C for O2 portable tank d/t tank low and use of O2wall unit.

## 2020-02-23 NOTE — Progress Notes (Signed)
Pt is requiring high 02 at this time, pt unable to wear CPAP.

## 2020-02-23 NOTE — Progress Notes (Addendum)
   Subjective: Patient reports feeling about the same this morning. She states she feels that her NRB is not providing her any oxygen. She states she is sore at the site of the tunneled HD cath. She denies any chest pain, abdominal pain or diarrhea. She has not seen any more blood in her BM.  Objective:  Vital signs in last 24 hours: Vitals:   02/23/20 0426 02/23/20 0548 02/23/20 0752 02/23/20 0859  BP: (!) 112/55   (!) 106/57  Pulse: 80  83 84  Resp: (!) 28  (!) 26 (!) 24  Temp: 97.8 F (36.6 C)   97.6 F (36.4 C)  TempSrc: Oral   Axillary  SpO2: 98%  97% 100%  Weight: 112.1 kg 113 kg    Height:        Supplemental Oxygen: 100% on 15L HFNC + NRB  Constitution: NAD, appears stated age Cardio: RRR, no m/r/g, no LE edema  Respiratory: CTA, no w/r/r Abdominal: NTTP, soft, non-distended MSK: moving all extremities Neuro: normal affect, a&ox3 Skin: c/d/i, tender to palpation at surgical site   Assessment/Plan:  Principal Problem:   COVID-19 Active Problems:   End stage renal disease on dialysis (HCC)   Diabetes mellitus type 2 in obese (HCC)   OSA (obstructive sleep apnea)   Bacterial pneumonia  49 year old female with ESRD on hemodialysis and chronic lung disease on chronic supplemental oxygen at 3 L/min being admitted with acute on chronic hypoxic respiratory failure due to COVID-19 pneumonia.    [facility] day 10. Saturating 100% on 15L HFNC, wean as tolerated.  Inflammatory markers pending.  Continue dexamethasone and 5 day course of antibiotics for possible superimposed bacterial infection.   - tussionex q12h prn, robitussen q4h prn - remdesivir completed 9/19 - dexamethasone day 10 - continue cefepime and azithromycin  - IS, flutter valve   Diabetes Mellitus Type 2 with hyperglycemia Fasting glucose remains elevated, 190. Increase basal insulin to 30 units bid.   - increase Lantus to 30 units bid - Insulin aspart 15 units with meals TID  - SSI    ESRD  on HD Tunneled HD cath placed on 9/22. HD completed after.   - Nephro on board, appreciate consult   Hematochezia Stable. Suspect external hemorrhoids vs small diverticular bleed. Will continue to monitor.    - continue DVT prophylaxis for now with stable hemoglobin, trend CBC - hold stool softeners, laxatives with recent diarrhea - hydrocortisone/lidocaine rectally no more than 7 days     VTE: Heparin IVF: None Diet: Heart/renal Code: Full code   Dispo: Anticipated discharge pending clinical improvement.   Mike Craze, DO 02/23/2020, 10:23 AM Pager: [phone] After 5pm on weekdays and 1pm on weekends: On Call Pager: [phone]

## 2020-02-23 NOTE — Progress Notes (Signed)
Constantly at patient bedside because patient pulling O2 NRB mask off  desating below 90% pt constantly instructed need to calm down and breathe with mask on to relieve low level after pt calmed down O2 levels improved to 96-100% on NRB and O2 [place] @ 15 LPM

## 2020-02-23 NOTE — Progress Notes (Signed)
[facility] Progress Note  Subjective:  Had  New TDC placed by IR yest and had HD overnight. No new c/o this am, on high flow O2, SOB no better or worse.    Vitals:   02/23/20 0426 02/23/20 0548 02/23/20 0752 02/23/20 0859  BP: (!) 112/55   (!) 106/57  Pulse: 80  83 84  Resp: (!) 28  (!) 26 (!) 24  Temp: 97.8 F (36.6 C)   97.6 F (36.4 C)  TempSrc: Oral   Axillary  SpO2: 98%  97% 100%  Weight: 112.1 kg 113 kg    Height:        Exam:   alert, nad , obese aaf, high flow O2  no jvd  Chest coarse BS/ rhonchi bilat  Cor reg no RG  Abd soft ntnd no ascites   Ext no LE edema or UE edema   Alert, NF, ox3  OP HD: TTS  ?time  400/800  2/2.25 bath  111kg  Hep ?  meds ?   Assessment/ Plan: # ESRD: TTS HD.  Had HD last night after new TDC.  Will have HD tomorrow and Sat (3 this week), then resume TTS next week.   # Clotted LUA AVF: appreciate IR assist, has TDC now. Consult [facility]VS later for perm access when patient recovered.   # Volume/ hypertension: 1-2 kg up today, has been down to 108. Will ^UF on HD tomorrow.  BP's up and down, home norvasc/ metoprolol are on hold.   # Anemia of Chronic Kidney Disease: Hemoglobin  8.7. Currently receiving aranesp 56mcg weekly outpt but will continue here at higher dose of 126mcg qtuesday.   # Secondary Hyperparathyroidism/Hyperphosphatemia:  Continue increased dose of Auryxia to 840 3 times daily.  Continue Sensipar 60 mg every other day  # COVID pna w/ acute on chronic hypoxic respiratory failure: Oxygen requirement persistent.  On dexamethasone, status post tocilizumab and remdesivir.  Continue therapy per primary team.  #DM2 uncontrolled with hyperglycemia: Hyperglycemia likely steroids contributing.  Management per primary team    [facility] 02/23/2020 1:25 PM    Rob Kazim Corrales 02/23/2020, 1:29 PM   Recent Labs  Lab 02/22/20 0229 02/23/20 0843  K 4.1 3.4*  BUN 112* 48*  CREATININE  13.85* 7.22*  CALCIUM 8.9 7.5*  PHOS 8.3* 4.2  HGB 9.0* 7.6*   Inpatient medications: . acetaminophen  650 mg Oral Once  . azithromycin  250 mg Oral q1800  . Chlorhexidine Gluconate Cloth  6 each Topical Q0600  . cinacalcet  60 mg Oral QODAY  . darbepoetin (ARANESP) injection - DIALYSIS  100 mcg Intravenous Q Tue-HD  . ferric citrate  840 mg Oral TID WC  . heparin injection (subcutaneous)  5,000 Units Subcutaneous Q8H  . hydrocortisone   Rectal BID  . insulin aspart  0-20 Units Subcutaneous TID WC  . insulin aspart  15 Units Subcutaneous TID WC  . insulin glargine  30 Units Subcutaneous BID  . levothyroxine  125 mcg Oral QAC breakfast  . pantoprazole  80 mg Oral Daily  . pregabalin  75 mg Oral Daily  . umeclidinium bromide  1 puff Inhalation Daily   . ceFEPime (MAXIPIME) IV 2 g (02/21/20 1658)   acetaminophen **OR** acetaminophen, albuterol, guaiFENesin-dextromethorphan, influenza vac split quadrivalent PF

## 2020-02-24 DIAGNOSIS — J9601 Acute respiratory failure with hypoxia: Secondary | ICD-10-CM

## 2020-02-24 DIAGNOSIS — G9340 Encephalopathy, unspecified: Secondary | ICD-10-CM

## 2020-02-24 DIAGNOSIS — U071 COVID-19: Principal | ICD-10-CM

## 2020-02-24 LAB — CBC
HCT: 27 % — ABNORMAL LOW (ref 36.0–46.0)
Hemoglobin: 8.7 g/dL — ABNORMAL LOW (ref 12.0–15.0)
MCH: 29.2 pg (ref 26.0–34.0)
MCHC: 32.2 g/dL (ref 30.0–36.0)
MCV: 90.6 fL (ref 80.0–100.0)
Platelets: 238 10*3/uL (ref 150–400)
RBC: 2.98 MIL/uL — ABNORMAL LOW (ref 3.87–5.11)
RDW: 18 % — ABNORMAL HIGH (ref 11.5–15.5)
WBC: 24.8 10*3/uL — ABNORMAL HIGH (ref 4.0–10.5)
nRBC: 0.9 % — ABNORMAL HIGH (ref 0.0–0.2)

## 2020-02-24 LAB — POCT I-STAT 7, (LYTES, BLD GAS, ICA,H+H)
Acid-Base Excess: 1 mmol/L (ref 0.0–2.0)
Bicarbonate: 27 mmol/L (ref 20.0–28.0)
Calcium, Ion: 1.14 mmol/L — ABNORMAL LOW (ref 1.15–1.40)
HCT: 26 % — ABNORMAL LOW (ref 36.0–46.0)
Hemoglobin: 8.8 g/dL — ABNORMAL LOW (ref 12.0–15.0)
O2 Saturation: 92 %
Patient temperature: 98.8
Potassium: 3.6 mmol/L (ref 3.5–5.1)
Sodium: 141 mmol/L (ref 135–145)
TCO2: 28 mmol/L (ref 22–32)
pCO2 arterial: 47.8 mmHg (ref 32.0–48.0)
pH, Arterial: 7.361 (ref 7.350–7.450)
pO2, Arterial: 67 mmHg — ABNORMAL LOW (ref 83.0–108.0)

## 2020-02-24 LAB — MAGNESIUM: Magnesium: 2.1 mg/dL (ref 1.7–2.4)

## 2020-02-24 LAB — BASIC METABOLIC PANEL
Anion gap: 16 — ABNORMAL HIGH (ref 5–15)
BUN: 51 mg/dL — ABNORMAL HIGH (ref 6–20)
CO2: 23 mmol/L (ref 22–32)
Calcium: 8.3 mg/dL — ABNORMAL LOW (ref 8.9–10.3)
Chloride: 101 mmol/L (ref 98–111)
Creatinine, Ser: 7.65 mg/dL — ABNORMAL HIGH (ref 0.44–1.00)
GFR calc Af Amer: 7 mL/min — ABNORMAL LOW (ref >60)
GFR calc non Af Amer: 6 mL/min — ABNORMAL LOW (ref >60)
Glucose, Bld: 111 mg/dL — ABNORMAL HIGH (ref 70–99)
Potassium: 3.9 mmol/L (ref 3.5–5.1)
Sodium: 140 mmol/L (ref 135–145)

## 2020-02-24 LAB — GLUCOSE, CAPILLARY
Glucose-Capillary: 104 mg/dL — ABNORMAL HIGH (ref 70–99)
Glucose-Capillary: 137 mg/dL — ABNORMAL HIGH (ref 70–99)
Glucose-Capillary: 53 mg/dL — ABNORMAL LOW (ref 70–99)
Glucose-Capillary: 87 mg/dL (ref 70–99)
Glucose-Capillary: 93 mg/dL (ref 70–99)
Glucose-Capillary: 97 mg/dL (ref 70–99)

## 2020-02-24 LAB — PHOSPHORUS: Phosphorus: 4 mg/dL (ref 2.5–4.6)

## 2020-02-24 LAB — LACTIC ACID, PLASMA: Lactic Acid, Venous: 1.8 mmol/L (ref 0.5–1.9)

## 2020-02-24 LAB — PROCALCITONIN: Procalcitonin: 2.82 ng/mL

## 2020-02-24 MED ORDER — SODIUM CHLORIDE 0.9 % IV SOLN
2.0000 g | INTRAVENOUS | Status: DC
  Filled 2020-02-24: qty 2

## 2020-02-24 MED ORDER — ORAL CARE MOUTH RINSE
15.0000 mL | Freq: Two times a day (BID) | OROMUCOSAL | Status: DC
  Administered 2020-02-24 – 2020-02-26 (×4): 15 mL via OROMUCOSAL

## 2020-02-24 MED ORDER — SODIUM CHLORIDE 0.9 % IV SOLN
500.0000 mg | INTRAVENOUS | Status: DC
  Administered 2020-02-24 – 2020-02-25 (×2): 500 mg via INTRAVENOUS
  Filled 2020-02-24 (×3): qty 500

## 2020-02-24 MED ORDER — INSULIN ASPART 100 UNIT/ML ~~LOC~~ SOLN
10.0000 [IU] | Freq: Three times a day (TID) | SUBCUTANEOUS | Status: DC
  Administered 2020-02-26: 10 [IU] via SUBCUTANEOUS

## 2020-02-24 MED ORDER — DEXTROSE 5 % IV SOLN
250.0000 mg | INTRAVENOUS | Status: DC
  Filled 2020-02-24: qty 250

## 2020-02-24 MED ORDER — INSULIN GLARGINE 100 UNIT/ML ~~LOC~~ SOLN
25.0000 [IU] | Freq: Two times a day (BID) | SUBCUTANEOUS | Status: DC
  Administered 2020-02-24 – 2020-03-01 (×9): 25 [IU] via SUBCUTANEOUS
  Filled 2020-02-24 (×16): qty 0.25

## 2020-02-24 MED ORDER — DEXTROSE 50 % IV SOLN
INTRAVENOUS | Status: AC
  Administered 2020-02-24: 50 mL via INTRAVENOUS
  Filled 2020-02-24: qty 50

## 2020-02-24 MED ORDER — HEPARIN SODIUM (PORCINE) 1000 UNIT/ML IJ SOLN
3800.0000 [IU] | Freq: Once | INTRAMUSCULAR | Status: AC
  Administered 2020-02-24: 3800 [IU]
  Filled 2020-02-24: qty 3.8

## 2020-02-24 NOTE — Progress Notes (Signed)
RT transported patient from 2W37 to 2M12 with rapid response. RT placed patient on bipap per MD order. Patient tolerating well at this time. RN at bedside.

## 2020-02-24 NOTE — Progress Notes (Signed)
[facility] Progress Note  Subjective:  Not doing any better or worse today, on FM O2.   Vitals:   02/23/20 2003 02/24/20 0002 02/24/20 0803 02/24/20 1114  BP:  133/75 136/68   Pulse: 80 89 85 84  Resp: 20 (!) 24 (!) 22   Temp:  98.4 F (36.9 C) 98.7 F (37.1 C)   TempSrc:      SpO2: 100% 100% 100% 90%  Weight:      Height:        Exam:   alert, nad , obese aaf, high flow O2  no jvd  Chest coarse BS/ rhonchi bilat  Cor reg no RG  Abd soft ntnd no ascites   Ext no LE edema or UE edema   Alert, NF, ox3    LUA AVF+bruit     OP HD: TTS  4h 61min 450/800  2/2.25 bath  111.5kg  Hep 2000  L AVF  - darbe 50 ug q week, last 9/7  - calc 1.0 tiw  - 9/9 Hb 10.0, tsat 22%  Assessment/ Plan: # COVID pna w/ acute on chronic hypoxic respiratory failure: Oxygen requirement persistent.  On dexamethasone, status post tocilizumab and remdesivir.  Severe CXR changes. Continue therapy per primary team.  # ESRD: TTS HD.  Had HD last night after new TDC.  Plan HD today and tomorrow to get back on schedule.   # Clotted LUA AVF: appreciate IR assist, has TDC now. Consult VVS later for perm access when patient recovered.   # Volume/ hypertension: unable to pull fluid on HD yest, doubt wt's are accurate. Not eating, no vol ^ on exam.  Home norvasc/ metoprolol are on hold.   # Anemia of Chronic Kidney Disease: Hemoglobin  8.7. Currently receiving aranesp 56mcg weekly outpt but will continue here at higher dose of 128mcg qtuesday.   # Secondary Hyperparathyroidism/Hyperphosphatemia:  Continue increased dose of Auryxia to 840 3 times daily.  Continue Sensipar 60 mg every other day  #DM2 uncontrolled with hyperglycemia: Hyperglycemia likely steroids contributing.  Management per primary team    Kelly Splinter, MD 02/24/2020, 12:49 PM     Recent Labs  Lab 02/22/20 0229 02/23/20 0843  K 4.1 3.4*  BUN 112* 48*  CREATININE 13.85* 7.22*  CALCIUM 8.9 7.5*  PHOS 8.3* 4.2  HGB  9.0* 7.6*   Inpatient medications: . acetaminophen  650 mg Oral Once  . azithromycin  250 mg Oral q1800  . Chlorhexidine Gluconate Cloth  6 each Topical Q0600  . cinacalcet  60 mg Oral QODAY  . darbepoetin (ARANESP) injection - DIALYSIS  100 mcg Intravenous Q Tue-HD  . ferric citrate  840 mg Oral TID WC  . heparin injection (subcutaneous)  5,000 Units Subcutaneous Q8H  . hydrocortisone   Rectal BID  . insulin aspart  0-20 Units Subcutaneous TID WC  . insulin aspart  15 Units Subcutaneous TID WC  . insulin glargine  30 Units Subcutaneous BID  . levothyroxine  125 mcg Oral QAC breakfast  . pantoprazole  80 mg Oral Daily  . pregabalin  75 mg Oral Daily  . umeclidinium bromide  1 puff Inhalation Daily   . ceFEPime (MAXIPIME) IV Stopped (02/23/20 1900)   acetaminophen **OR** acetaminophen, albuterol, guaiFENesin-dextromethorphan, influenza vac split quadrivalent PF

## 2020-02-24 NOTE — Progress Notes (Signed)
   Subjective: Patient reports feeling well this morning. Denies any chest pain, abdominal pain, or any more blood in her stool.   Objective:  Vital signs in last 24 hours: Vitals:   02/23/20 0859 02/23/20 1624 02/23/20 2003 02/24/20 0002  BP: (!) 106/57 (!) 126/57  133/75  Pulse: 84 80 80 89  Resp: (!) 24 20 20  (!) 24  Temp: 97.6 F (36.4 C) 98.3 F (36.8 C)  98.4 F (36.9 C)  TempSrc: Axillary     SpO2: 100% 100% 100% 100%  Weight:      Height:        Supplemental Oxygen: 100% on 15L HFNC + NRB  Constitution: NAD, appears stated age Cardio: RRR, no m/r/g, no LE edema  Respiratory: CTA, no w/r/r Abdominal: NTTP, soft, non-distended MSK: moving all extremities Neuro: normal affect, a&ox3 Skin: c/d/i, tender to palpation near surgical site   Assessment/Plan:  Principal Problem:   COVID-19 Active Problems:   End stage renal disease on dialysis (HCC)   Diabetes mellitus type 2 in obese (HCC)   OSA (obstructive sleep apnea)   Bacterial pneumonia   89 year oldfemalewith ESRDon hemodialysis and chronic lung disease on chronic supplemental oxygen at 3 L/min being admitted with acute on chronic hypoxic respiratory failure due to COVID-19 pneumonia.  [place].Saturating 100% on 15L HFNC, wean as tolerated. D-dimer continues to remain elevated as of yesterday, CRP trending down.  Completed dexamethasone. Continue 5 day course of antibiotics for possible superimposed bacterial infection.  - tussionex q12h prn, robitussen q4h prn - remdesivircompleted 9/19 - dexamethasonecompleted 9/23 - continue cefepime and azithromycin day 4/5  - IS, flutter valve  Diabetes Mellitus Type 2 with hyperglycemia  - continue Lantus 30unitsbid - Insulin aspart 15units with meals TID  - SSI  ESRD on HD Tunneled HD cath placed on 9/22. HD planned for today and tomorrow.   - Nephro on board, appreciate consult  Hematochezia Stable. Suspect external  hemorrhoids vs small diverticular bleed. Will continue to monitor.  - continue DVT prophylaxis for now with stable hemoglobin, trend CBC - hold stool softeners, laxatives with recent diarrhea - hydrocortisone/lidocaine rectally no more than 7 days   WRU:[id number] JXB:JYNW Diet:Heart/renal Code:Full code  Dispo: Anticipated dischargepending clinical improvement.  Leovanni Bjorkman N, DO 02/24/2020, 7:13 AM Pager: [phone] After 5pm on weekdays and 1pm on weekends: On Call Pager: [phone]

## 2020-02-24 NOTE — Progress Notes (Signed)
Pt not tolerated HD  tx well d/t pt had SOB. Hypotension and confused in HD. Marland KitchenPt did 2 hour. Taking off 1L.Tx terminated and Dr. Jonnie Finner notified

## 2020-02-24 NOTE — Progress Notes (Addendum)
Pt status declined through out the day requiring more and more oxygen. Eventually getting tired. She went From 15 L HF and a nonrebreathier to HHF at 30L 100% and Nonrebreather. MD aware assessed her at bedside multiple times. At 1600 pt was getting dialysis and dialysis nurse called me to the room to notify me that her Sats were in the 44s. Upon assessment pt is more lethargic paged respiratory and notified MD. Was told CCM will come and assess her at bedside. At 1700 CCM at beside orders to emergently transfer pt to ICU for Bi-Pap and possible intubation.

## 2020-02-24 NOTE — Significant Event (Signed)
Rapid Response Event Note   Reason for Call :  Increased work of breathing. Progressive increase in supplemental oxygen requirement.   Initial Focused Assessment:  Pt lying in bed. Lethargic, opens eyes to voice. Oriented to person, she does not answer additional orientation questions. She is noted to have labored breathing and tachypnea. Pt appears restless, leaning to side of bed and requiring multiple requests to follow commands.   PCCM consulted, awaiting evaluation.   VS: T 98.18F, BP 107/74, HR 93, RR 30, SpO2 93% on 40L/100% HHFNC & 100% NRB  Interventions:  -Transfer to ICU for BiPAP  Event Summary:  MD Notified:  Call Time: [place] Time: 7829 End Time: [place], RN

## 2020-02-24 NOTE — Progress Notes (Signed)
Pharmacy Antibiotic Note  Wanda Ramirez is a 49 y.o. female admitted on 02/13/2020 with COVID and now with concern for superimposed PNA. Pharmacy has been consulted for Cefepime dosing.  The patient is ESRD-TTS, last on Thursday 9/23. Nephrology plans for extra HD today 9/24 and then back to prior schedule. Discussed length of therapy with Internal medicine team as today is day #5 of therapy - confirmed therapy is to end after today's dose.   Plan: - Cefepime 2g IV post dialysis today (off schedule) to complete total of 5 days of therapy.  - Pharmacy will sign off - please re-consult if needed.   Height: 5\' 2"  (157.5 cm) Weight: 113 kg (249 lb 1.9 oz) IBW/kg (Calculated) : 50.1  Temp (24hrs), Avg:98.5 F (36.9 C), Min:98.3 F (36.8 C), Max:98.7 F (37.1 C)  Recent Labs  Lab 02/18/20 0140 02/18/20 0140 02/19/20 0445 02/20/20 0900 02/20/20 1944 02/21/20 0542 02/22/20 0229 02/23/20 0843  WBC 14.5*   < > 18.9*  --  17.3* 16.8* 18.3* 16.2*  CREATININE 11.15*  --   --  9.67*  --  12.22* 13.85* 7.22*   < > = values in this interval not displayed.    Estimated Creatinine Clearance: 11.2 mL/min (A) (by C-G formula based on SCr of 7.22 mg/dL (H)).    Allergies  Allergen Reactions  . Hydrocodone Itching and Nausea And Vomiting    Antimicrobials this admission: Remdesivir 9/14 >> 9/19 Azithromycin 9/20 >> 9/24 Cefepime 9/20 >>9/24  Dose adjustments this admission: n/a  Microbiology results: 9/13 BCx >> NGf  Thank you for allowing pharmacy to be a part of this patient's care.  Sloan Leiter, PharmD, BCPS, BCCCP Clinical Pharmacist Please refer to [facility] for [place] numbers 02/24/2020 1:26 PM   **Pharmacist phone directory can now be found on [email or web address] ([email or web address]).  Listed under [place].

## 2020-02-24 NOTE — Progress Notes (Signed)
I was notified by Rod Holler patient's nurse that she is currently receiving hemodialysis in her room.  She had paged respiratory to bedside because of increased work of breathing and inability to keep O2 saturations up.  She reported to me that she was on 30 L of supplemental oxygen via facemask they are having some trouble keeping facemask on.  Given that this patient is ESRD on hemodialysis, and earlier today when she was not in distress she was on 25 L and still having some difficulty maintaining O2 saturations in the high 80s I called PCCM for evaluation and spoke with Meyer Russel and Dr. Vallarie Mare they wanted me to repeat my evaluation of her in person.  I did so, I found the patient in moderate distress stating "I do not feel well", she was leaning towards the left side of her hospital bed, dialysis nurse at bedside helping her to keep her o2 facemask on She continues to have diffuse rhonchi.  O2 sats were confirmed to be in the low 80s. I discussed this at length with her nurse Rod Holler and respiratory therapist.  Respiratory therapist does report to me that throughout the day she has become progressively dyspneic and she has had increased trouble keeping her O2 saturations up despite her interventions.  She is also requesting PCCM evaluation. I then spoke with Meyer Russel in person and let her know my evaluation that the patient is indeed in distress O2 saturations were indeed low and I do want a PCCM consult.  Lucious Groves, DO 02/24/20 4:47 PM

## 2020-02-24 NOTE — Plan of Care (Signed)
°  Problem: Education: Goal: Knowledge of risk factors and measures for prevention of condition will improve Outcome: Not Progressing   Problem: Coping: Goal: Psychosocial and spiritual needs will be supported Outcome: Not Progressing   Problem: Respiratory: Goal: Will maintain a patent airway Outcome: Not Progressing Goal: Complications related to the disease process, condition or treatment will be avoided or minimized Outcome: Not Progressing   Problem: Education: Goal: Knowledge of General Education information will improve Description: Including pain rating scale, medication(s)/side effects and non-pharmacologic comfort measures Outcome: Not Progressing

## 2020-02-24 NOTE — Consult Note (Signed)
NAME:  Wanda Ramirez, MRN:  [id number], DOB:  06-13-1970, LOS: 64 ADMISSION DATE:  02/13/2020, CONSULTATION DATE: 02/24/2020 REFERRING MD: Dr. Heber [employer], CHIEF COMPLAINT: Increasing supplemental oxygen demand  Brief History   49 year old female presents with acute on chronic hypoxic respiratory failure due to COVID-19.  PCCM consulted 9/24 due to increasing supplemental oxygen demand.  History of present illness   Wanda Ramirez is a 49 year old female with a past medical history significant for end-stage renal disease on hemodialysis MWF, chronic hypoxic respiratory failure on 3 L nasal cannula at baseline, sleep apnea, diabetes, hypertension, hyperlipidemia, hypothyroidism, and asthma who presented to the emergency department with complaints of worsening shortness of breath, dry cough, generalized body aches, and loose stools.  Patient was diagnosed 9/9 with Covid pneumonia.  Initially she was able to maintain outpatient status but when symptoms worsened 9/14 she presented to the emergency department.  Patient reported dyspnea and weakness worsened to the point where she was unable to ambulate effectively.  She also reports she was unable to maintain adequate oral intake.  Patient received the maternal vaccines in March and April of this year.  Patient presented initially febrile with a temperature of 102.9 and mildly tachypneic with all other vital signs within normal limits.  She was initially managed on 4 L nasal cannula.  Lab work significant for NA 130, K3.8, glucose 454, anion gap 21, lactic acid 1.8, LDH 296, ferritin 6690, CRP 13.3, and procalcitonin 10.51  On review of medical record it appears patient has progressively required increased supplemental oxygen requirement.  Afternoon of 9/24 patient had episode of desaturation requiring further increase in supplemental oxygen.  On chart assessment it appears patient has been placed on 40 L with reported increased work of breathing prompting PCCM  consultation  Past Medical History  Sleep apnea Morbid obesity Hypothyroidism Hypertension GERD  End-stage renal disease on HD MWF Diabetes Asthma  Significant Hospital Events   Admitted 9/14  Consults:  PCCM  Procedures:  9/23 IR guided tunneled HD cath placement  Significant Diagnostic Tests:  Pulmonary VQ scan at 9/21 > negative  Micro Data:  COVID 9/14 >  Blood cultures 9/13 > negative  Antimicrobials:  Cefepime 9/24 > Azithromycin 9/21 >  Dexamethasone Remdesivir completed 9/17 > 9/19 Tocilizumab x1  Interim history/subjective:  Lying in bed on side currently undergoing iHD,  She is lethargic but will arouse to verbal stimuli but quickly falls back to sleep.  RN states patient has had progressive decline over the last 12hrs  Objective   Blood pressure 118/70, pulse 90, temperature 98.6 F (37 C), temperature source Axillary, resp. rate (!) 26, height 5\' 2"  (1.575 m), weight 110 kg, SpO2 90 %.    FiO2 (%):  [100 %] 100 %  No intake or output data in the 24 hours ending 02/24/20 1645 Filed Weights   02/23/20 0426 02/23/20 0548 02/24/20 1540  Weight: 112.1 kg 113 kg 110 kg    Examination: General: Chronically ill appearing elderly obese adult female lying in bed on HHFNC, in NAD HEENT: [place]/AT, MM pink/moist, PERRL,  Neuro: Lethargic with decreased mentation, appears hypercapnic, ,  CV: s1s2 regular rate and rhythm, no murmur, rubs, or gallops,  PULM: limited air movement bilaterally, increased work of breathing with moderate accessory muscle use seen, oxygen saturations mid to upper 80's on 40 L HHFNC GI: soft, bowel sounds active in all 4 quadrants, non-tender, non-distended Extremities: warm/dry, no edema  Skin: no rashes or lesions  Resolved Hospital Problem  list     Assessment & Plan:  COVID pneumonia  -LDH 297 9/14, Ferritin 4405 9/19, CRP 1.7 9/23, Lactic 1.4 9/14, Procalcitonin 1.90 9/20 P: Trial of BIPAP now  She is very high risk for  intubation  Transfer to ICU now Can consider ABG if BIPAP fails  S/P Decadron  Remdesiver provided 9/17 > 9/19 Continue antibiotics as below   Acute Hypoxic / Hypercapnic Respiratory Failure  -Secondary to above  P: Transfer to ICU  Trial BIPAP now  Will likely need intubation  Head of bed elevated 30 degrees. Plateau pressures less than 30 cm H20.  Follow intermittent chest x-ray and ABG.   Ensure adequate pulmonary hygiene   ESRD on HD  -Tunneled HD cath placed 9/22 -Finished 1.5hrs of iHD 9/24 before transfer to ICU  P: Volume removal per nephrology  Follow renal function / urine output Trend Bmet Avoid nephrotoxins, ensure adequate renal perfusion   Type 2 diabetes  P: Continue long acting insulin  Continue SSI and meal coverage CBG q4 CBG goal 180   Best practice:  Diet:NPO Pain/Anxiety/Delirium protocol (if indicated): PRN VAP protocol (if indicated): N/A DVT prophylaxis: Subq heparin GI prophylaxis: PPI Glucose control: SSI Mobility: Bedrest  Code Status: Full Family Communication: Both daughters updated over the phone 9/24 Disposition: Transfer to ICU  Labs   CBC: Recent Labs  Lab 02/18/20 0140 02/18/20 0140 02/19/20 0445 02/20/20 1944 02/21/20 0542 02/22/20 0229 02/23/20 0843  WBC 14.5*   < > 18.9* 17.3* 16.8* 18.3* 16.2*  NEUTROABS 11.5*  --  15.5* 14.0* 13.4* 14.4*  --   HGB 9.5*   < > 8.7* 9.0* 8.0* 9.0* 7.6*  HCT 29.4*   < > 26.8* 28.2* 24.8* 28.1* 23.3*  MCV 89.1   < > 86.5 87.9 87.0 88.4 87.6  PLT 353   < > 421* 402* 361 400 294   < > = values in this interval not displayed.    Basic Metabolic Panel: Recent Labs  Lab 02/18/20 0140 02/20/20 0900 02/21/20 0542 02/22/20 0229 02/23/20 0843  NA 137 138 137 134* 138  K 3.8 3.2* 4.0 4.1 3.4*  CL 96* 98 98 96* 101  CO2 21* 25 23 22 24   GLUCOSE 218* 172* 236* 383* 184*  BUN 96* 68* 88* 112* 48*  CREATININE 11.15* 9.67* 12.22* 13.85* 7.22*  CALCIUM 9.1 8.7* 8.3* 8.9 7.5*  MG 2.6*  2.2  --   --   --   PHOS 7.1* 6.0* 7.1* 8.3* 4.2   GFR: Estimated Creatinine Clearance: 11 mL/min (A) (by C-G formula based on SCr of 7.22 mg/dL (H)). Recent Labs  Lab 02/19/20 0445 02/20/20 0900 02/20/20 1944 02/21/20 0542 02/22/20 0229 02/23/20 0843  PROCALCITON  --  1.90  --   --   --   --   WBC   < >  --  17.3* 16.8* 18.3* 16.2*   < > = values in this interval not displayed.    Liver Function Tests: Recent Labs  Lab 02/18/20 0140 02/20/20 0900 02/21/20 0542 02/22/20 0229 02/23/20 0843  AST 36 36  --   --   --   ALT 25 23  --   --   --   ALKPHOS 78 140*  --   --   --   BILITOT 0.5 0.8  --   --   --   PROT 6.5 6.3*  --   --   --   ALBUMIN 2.6* 2.5* 2.4* 2.9* 2.3*  No results for input(s): LIPASE, AMYLASE in the last 168 hours. No results for input(s): AMMONIA in the last 168 hours.  ABG    Component Value Date/Time   TCO2 23 11/10/2017 1010     Coagulation Profile: No results for input(s): INR, PROTIME in the last 168 hours.  Cardiac Enzymes: No results for input(s): CKTOTAL, CKMB, CKMBINDEX, TROPONINI in the last 168 hours.  HbA1C: Hgb A1c MFr Bld  Date/Time Value Ref Range Status  02/14/2020 12:16 PM 12.8 (H) 4.8 - 5.6 % Final    Comment:    (NOTE) Pre diabetes:          5.7%-6.4%  Diabetes:              >6.4%  Glycemic control for   <7.0% adults with diabetes   02/08/2018 11:42 AM 9.5 (H) 4.8 - 5.6 % Final    Comment:    (NOTE)         Prediabetes: 5.7 - 6.4         Diabetes: >6.4         Glycemic control for adults with diabetes: <7.0     CBG: Recent Labs  Lab 02/23/20 1625 02/23/20 2202 02/24/20 0716 02/24/20 1204 02/24/20 1624  GLUCAP 236* 130* 87 137* 93    Review of Systems:   Unable to gather due to acute respiratory distress   Past Medical History  She,  has a past medical history of Arthritis, Asthma, Chronic back pain, Diabetes mellitus ([place]), ESRD (end stage renal disease) on dialysis ([place]), Essential hypertension,  GERD (gastroesophageal reflux disease), Glaucoma, Hyperlipidemia, Hypothyroidism, Morbid obesity ([place]), Peripheral neuropathy, and Sleep apnea.   Surgical History    Past Surgical History:  Procedure Laterality Date  . AV FISTULA PLACEMENT Left 04/2014  . CARDIAC CATHETERIZATION N/A 03/17/2016   Procedure: Left Heart Cath and Coronary Angiography;  Surgeon: [name], MD;  Location: [facility];  Service: Cardiovascular;  Laterality: N/A;  . IR FLUORO GUIDE CV LINE LEFT  02/22/2020  . IR US GUIDE VASC ACCESS LEFT  02/22/2020  . KNEE SURGERY    . TUBAL LIGATION       Social History   reports that she has quit smoking. She has never used smokeless tobacco. She reports that she does not drink alcohol and does not use drugs.   Family History   Her family history includes Diabetes in her mother; Hyperlipidemia in her mother; Kidney disease in her father.   Allergies Allergies  Allergen Reactions  . Hydrocodone Itching and Nausea And Vomiting     Home Medications  Prior to Admission medications   Medication Sig Start Date End Date Taking? Authorizing Provider  acetaminophen (TYLENOL) 325 MG tablet Take 650 mg by mouth every 6 (six) hours as needed for mild pain.   Yes [provider]  albuterol (PROVENTIL HFA;VENTOLIN HFA) 108 (90 Base) MCG/ACT inhaler Inhale 2 puffs into the lungs every 6 (six) hours as needed for wheezing or shortness of breath.   Yes [provider]  amLODipine (NORVASC) 10 MG tablet Take 10 mg by mouth at bedtime.   Yes [provider]  aspirin EC 81 MG tablet Take 81 mg by mouth daily.   Yes [provider]  AURYXIA 1 GM 210 MG(Fe) tablet Take 210 mg by mouth 3 (three) times daily with meals.  12/18/17  Yes [provider]  benzonatate (TESSALON) 100 MG capsule Take 100 mg by mouth as needed for cough.  Yes [provider]  Brinzolamide-Brimonidine (SIMBRINZA) 1-0.2 % SUSP Place 2 drops into  both eyes 3 (three) times daily.    Yes [provider]  calcium acetate (PHOSLO) 667 MG capsule Take 1,334 mg by mouth 3 (three) times daily. 10/26/17  Yes [provider]  cinacalcet (SENSIPAR) 90 MG tablet Take 90 mg by mouth daily.    Yes [provider]  glimepiride (AMARYL) 2 MG tablet Take 2 mg by mouth daily with supper.   Yes [provider]  glimepiride (AMARYL) 4 MG tablet Take 4 mg by mouth daily with breakfast.   Yes [provider]  insulin regular human CONCENTRATED (HUMULIN R) 500 UNIT/ML injection Inject 3-6 Units into the skin 2 (two) times daily with a meal. Takes 3 units QAM and 6 units QHS   Yes [provider]  levothyroxine (SYNTHROID, LEVOTHROID) 125 MCG tablet Take 125 mcg by mouth daily before breakfast.   Yes [provider]  liraglutide (VICTOZA) 18 MG/3ML SOPN Inject 1.8 mg into the skin daily.   Yes [provider]  loratadine (CLARITIN) 10 MG tablet Take 10 mg by mouth daily.   Yes [provider]  metoprolol succinate (TOPROL-XL) 50 MG 24 hr tablet Take 50 mg by mouth 2 (two) times daily. Take with or immediately following a meal.    Yes [provider]  Netarsudil Dimesylate (RHOPRESSA) 0.02 % SOLN Place 1 drop into both eyes daily.    Yes [provider]  omeprazole (PRILOSEC) 40 MG capsule Take 40 mg by mouth daily.   Yes [provider]  ondansetron (ZOFRAN) 4 MG tablet Take 4 mg by mouth daily.   Yes [provider]  Oxycodone HCl 10 MG TABS Take 10 mg by mouth 4 (four) times daily as needed for pain. 01/01/16  Yes [provider]  pregabalin (LYRICA) 75 MG capsule Take 1 capsule (75 mg total) by mouth daily. Take an additional 75mg  on Dialysis days 02/09/18  Yes Hongalgi, Lenis Dickinson, MD  tiotropium (SPIRIVA) 18 MCG inhalation capsule Place 18 mcg into inhaler and inhale daily.   Yes [provider]  Travoprost, BAK Free, (TRAVATAN) 0.004  % SOLN ophthalmic solution Place 1 drop into both eyes every morning.   Yes [provider]  famotidine (PEPCID) 40 MG tablet Take 40 mg by mouth daily.    [provider]     Critical care time:    Performed by: Johnsie Cancel  Total critical care time: 45 minutes  Critical care time was exclusive of separately billable procedures and treating other patients.  Critical care was necessary to treat or prevent imminent or life-threatening deterioration.  Critical care was time spent personally by me on the following activities: development of treatment plan with patient and/or surrogate as well as nursing, discussions with consultants, evaluation of patient's response to treatment, examination of patient, obtaining history from patient or surrogate, ordering and performing treatments and interventions, ordering and review of laboratory studies, ordering and review of radiographic studies, pulse oximetry and re-evaluation of patient's condition.  Johnsie Cancel, NP-C [facility] Contact / Pager information can be found on Amion  02/24/2020, 5:48 PM

## 2020-02-24 NOTE — Progress Notes (Signed)
Pt O2 Sat dropped in the low 70s when she took her BiPap off... Pt became agitated and would not allow for me to put it back on.... Was able to place BiPap back on and tried to reorient pt.... Pt is following commands intermittently and is confused.... After Bipap placed, O2 climbed back < 95% after 2 minutes.   At the same time, her BS dropped to 53.... 25g of D50 given.  Will continue to assess BS, O2, mentation.

## 2020-02-24 NOTE — Progress Notes (Signed)
Daughter of pt Wanda Ramirez) called wanting updates... updates provided and verb understanding.

## 2020-02-24 NOTE — Progress Notes (Signed)
RT placed patient on [place] at 25L and 100% with NRB per MD order due to increased work of breathing and sats. Patient tolerating well at this time and sating 90%. RN aware and RT will continue to monitor.

## 2020-02-25 LAB — GLUCOSE, CAPILLARY
Glucose-Capillary: 108 mg/dL — ABNORMAL HIGH (ref 70–99)
Glucose-Capillary: 110 mg/dL — ABNORMAL HIGH (ref 70–99)
Glucose-Capillary: 110 mg/dL — ABNORMAL HIGH (ref 70–99)
Glucose-Capillary: 116 mg/dL — ABNORMAL HIGH (ref 70–99)
Glucose-Capillary: 118 mg/dL — ABNORMAL HIGH (ref 70–99)
Glucose-Capillary: 166 mg/dL — ABNORMAL HIGH (ref 70–99)
Glucose-Capillary: 69 mg/dL — ABNORMAL LOW (ref 70–99)

## 2020-02-25 LAB — CBC
HCT: 26.8 % — ABNORMAL LOW (ref 36.0–46.0)
Hemoglobin: 8.4 g/dL — ABNORMAL LOW (ref 12.0–15.0)
MCH: 28.2 pg (ref 26.0–34.0)
MCHC: 31.3 g/dL (ref 30.0–36.0)
MCV: 89.9 fL (ref 80.0–100.0)
Platelets: 232 10*3/uL (ref 150–400)
RBC: 2.98 MIL/uL — ABNORMAL LOW (ref 3.87–5.11)
RDW: 18.1 % — ABNORMAL HIGH (ref 11.5–15.5)
WBC: 23.3 10*3/uL — ABNORMAL HIGH (ref 4.0–10.5)
nRBC: 0.7 % — ABNORMAL HIGH (ref 0.0–0.2)

## 2020-02-25 LAB — RENAL FUNCTION PANEL
Albumin: 2.2 g/dL — ABNORMAL LOW (ref 3.5–5.0)
Anion gap: 15 (ref 5–15)
BUN: 57 mg/dL — ABNORMAL HIGH (ref 6–20)
CO2: 25 mmol/L (ref 22–32)
Calcium: 8.3 mg/dL — ABNORMAL LOW (ref 8.9–10.3)
Chloride: 101 mmol/L (ref 98–111)
Creatinine, Ser: 8.5 mg/dL — ABNORMAL HIGH (ref 0.44–1.00)
GFR calc Af Amer: 6 mL/min — ABNORMAL LOW (ref >60)
GFR calc non Af Amer: 5 mL/min — ABNORMAL LOW (ref >60)
Glucose, Bld: 133 mg/dL — ABNORMAL HIGH (ref 70–99)
Phosphorus: 5.3 mg/dL — ABNORMAL HIGH (ref 2.5–4.6)
Potassium: 3.8 mmol/L (ref 3.5–5.1)
Sodium: 141 mmol/L (ref 135–145)

## 2020-02-25 LAB — LACTIC ACID, PLASMA
Lactic Acid, Venous: 1.3 mmol/L (ref 0.5–1.9)
Lactic Acid, Venous: 1.2 mmol/L (ref 0.5–1.9)

## 2020-02-25 MED ORDER — SODIUM CHLORIDE 0.9 % IV SOLN
2.0000 g | INTRAVENOUS | Status: DC
  Administered 2020-02-25 – 2020-02-26 (×2): 2 g via INTRAVENOUS
  Filled 2020-02-25 (×2): qty 2

## 2020-02-25 MED ORDER — SODIUM CHLORIDE 0.9% FLUSH
10.0000 mL | INTRAVENOUS | Status: DC | PRN
  Administered 2020-02-27: 10 mL
  Administered 2020-03-18: 20 mL
  Administered 2020-03-31: 10 mL

## 2020-02-25 MED ORDER — NOREPINEPHRINE 4 MG/250ML-% IV SOLN
0.0000 ug/min | INTRAVENOUS | Status: DC
  Administered 2020-02-25: 4 ug/min via INTRAVENOUS
  Filled 2020-02-25 (×2): qty 250

## 2020-02-25 MED ORDER — DEXMEDETOMIDINE HCL IN NACL 400 MCG/100ML IV SOLN
0.2000 ug/kg/h | INTRAVENOUS | Status: DC
  Administered 2020-02-25 (×3): 0.4 ug/kg/h via INTRAVENOUS
  Administered 2020-02-25 – 2020-02-26 (×3): 0.8 ug/kg/h via INTRAVENOUS
  Filled 2020-02-25 (×6): qty 100

## 2020-02-25 MED ORDER — PANTOPRAZOLE SODIUM 40 MG IV SOLR
40.0000 mg | Freq: Two times a day (BID) | INTRAVENOUS | Status: DC
  Administered 2020-02-25 (×2): 40 mg via INTRAVENOUS
  Filled 2020-02-25 (×2): qty 40

## 2020-02-25 MED ORDER — DEXTROSE 50 % IV SOLN
12.5000 g | INTRAVENOUS | Status: AC
  Administered 2020-02-25: 12.5 g via INTRAVENOUS
  Filled 2020-02-25: qty 50

## 2020-02-25 MED ORDER — SODIUM CHLORIDE 0.9% FLUSH
10.0000 mL | Freq: Two times a day (BID) | INTRAVENOUS | Status: DC
  Administered 2020-02-25 – 2020-04-24 (×94): 10 mL
  Administered 2020-04-25: 30 mL
  Administered 2020-04-26 – 2020-05-15 (×37): 10 mL

## 2020-02-25 MED ORDER — DEXTROSE 10 % IV SOLN: INTRAVENOUS | Status: DC

## 2020-02-25 MED ORDER — SODIUM CHLORIDE 0.9 % IV BOLUS
250.0000 mL | Freq: Once | INTRAVENOUS | Status: AC
  Administered 2020-02-25: 250 mL via INTRAVENOUS

## 2020-02-25 MED ORDER — NOREPINEPHRINE 4 MG/250ML-% IV SOLN
INTRAVENOUS | Status: AC
  Administered 2020-02-25: 4 ug/min via INTRAVENOUS
  Filled 2020-02-25: qty 250

## 2020-02-25 NOTE — Progress Notes (Signed)
Initial Nutrition Assessment  DOCUMENTATION CODES:   Morbid obesity  INTERVENTION:  If unable to advance diet within the next 24-48 hours, recommend initiation of enteral nutrition using Vital 1.5 formula with goal rate of 55 ml/hr.   45 ml Prosource TF TID.  Tube feeding regimen to provide 2100 kcal, 122 grams of protein, and 1003 ml water.   If unable to initiate nutrition, may need consideration of TPN to provide adequate nutrition.   NUTRITION DIAGNOSIS:   Inadequate oral intake related to inability to eat as evidenced by NPO status.  GOAL:   Patient will meet greater than or equal to 90% of their needs  MONITOR:   Diet advancement, Skin, Weight trends, Labs, I & O's  REASON FOR ASSESSMENT:   Consult Assessment of nutrition requirement/status  ASSESSMENT:   49 year old female with a past medical history significant for end-stage renal disease on hemodialysis MWF, chronic hypoxic respiratory failure on 3 L nasal cannula at baseline, sleep apnea, diabetes, hypertension, hyperlipidemia, hypothyroidism, and asthma presents with complaints of worsening shortness of breath. Previously diagnosed with COVID PNA on 9/9.  Pt is currently NPO on BiPAP. Pt transferred to ICU yesterday due to decline in respiratory status with no improvement with 40 L HFNC. Prior to NPO status, pt po intake poor at 25-30%. If respiratory status does not improve and diet unable to be advanced within the next 24-48 hours, recommend initiation of enteral nutrition. If unable to initiate nutrition, may need consideration of TPN to provide adequate energy/protein needs. Per MD and RN, pt with agitation delirium. Plans for HD session today.   Unable to complete Nutrition-Focused physical exam at this time. RD working remotely.  Labs and medications reviewed.   Diet Order:   Diet Order            Diet NPO time specified  Diet effective now                 EDUCATION NEEDS:   Not appropriate for  education at this time  Skin:  Skin Assessment: Reviewed RN Assessment  Last BM:  9/25  Height:   Ht Readings from Last 1 Encounters:  02/14/20 5\' 2"  (1.575 m)    Weight:   Wt Readings from Last 1 Encounters:  02/25/20 104.6 kg   BMI:  Body mass index is 42.18 kg/m.  Estimated Nutritional Needs:   Kcal:  2100-2300  Protein:  105-120 grams  Fluid:  >/= 2 L/day  Corrin Parker, MS, RD, LDN RD pager number/after hours weekend pager number on Amion.

## 2020-02-25 NOTE — Progress Notes (Signed)
Pt had combative episode in which she took her Bipap mask off and would not allow RN to apply it back on. O2 dropped to 65%. Bipap mask placed back onto patient and precedex drip increased. Pt tolerating bipap mask currently nd no longer combative at this time since increasing precedex.

## 2020-02-25 NOTE — Progress Notes (Signed)
Spoke with MD about aline placement. Decided to hold off. RT to monitor as needed

## 2020-02-25 NOTE — Progress Notes (Addendum)
PCCM Interval progress:  Pt with hypotension overnight after being turned for a bath and starting Precedex.  Pt is on Levophed 71mcg and BP improved  to 104/75.  Given she has ESRD and an AVF, will hold placing CVC and monitor for a few hours. If pressor requirement goes up and clinical status deteriorates she may need a trialysis catheter for CRRT.  Otilio Carpen Sharesa Kemp, PA-C

## 2020-02-25 NOTE — Progress Notes (Signed)
NAME:  Wanda Ramirez, MRN:  [id number], DOB:  [birth year], LOS: 2 ADMISSION DATE:  02/13/2020, CONSULTATION DATE: 02/24/2020 REFERRING MD: Dr. Heber Maxwell, CHIEF COMPLAINT: Increasing supplemental oxygen demand  Brief History    Wanda Ramirez is a 49 year old female with a past medical history significant for end-stage renal disease on hemodialysis MWF, chronic hypoxic respiratory failure on 3 L nasal cannula at baseline, sleep apnea, diabetes, hypertension, hyperlipidemia, hypothyroidism, and asthma who presented to the emergency department with complaints of worsening shortness of breath, dry cough, generalized body aches, and loose stools.  Patient was diagnosed 9/9 with Covid pneumonia.  Initially she was able to maintain outpatient status but when symptoms worsened 9/14 she presented to the emergency department.  Patient reported dyspnea and weakness worsened to the point where she was unable to ambulate effectively.  She also reports she was unable to maintain adequate oral intake.  Patient received the maternal vaccines in March and April of this year.  Patient presented initially febrile with a temperature of 102.9 and mildly tachypneic with all other vital signs within normal limits.  She was initially managed on 4 L nasal cannula.  Lab work significant for NA 130, K3.8, glucose 454, anion gap 21, lactic acid 1.8, LDH 296, ferritin 6690, CRP 13.3, and procalcitonin 10.51  On review of medical record it appears patient has progressively required increased supplemental oxygen requirement.  Afternoon of 9/24 patient had episode of desaturation requiring further increase in supplemental oxygen.  On chart assessment it appears patient has been placed on 40 L with reported increased work of breathing prompting PCCM consultation  Past Medical History  Sleep apnea Morbid obesity Hypothyroidism Hypertension GERD  End-stage renal disease on HD MWF Diabetes Asthma  Significant Hospital Events    Admitted 9/14  9/24 - Lying in bed on side currently undergoing iHD,  She is lethargic but will arouse to verbal stimuli but quickly falls back to sleep.  RN states patient has had progressive decline over the last 12hrs  Consults:  PCCM  Procedures:  9/23 IR guided tunneled HD cath placement  Significant Diagnostic Tests:  Pulmonary VQ scan at 9/21 > negative  Micro Data:  COVID 9/14 >  Blood cultures 9/13 > negative  Antimicrobials:  Cefepime 9/24 > Azithromycin 9/21 >  Dexamethasone Remdesivir completed 9/17 > 9/19 Tocilizumab x1  Interim history/subjective:   093 -currently in medical ICU to [place].  Bed 12.  She has been on BiPAP with Precedex overnight.  This morning on the low-dose of Precedex she got agitated.?  Also had melena.  She also desaturated.  Nursing then increase her Precedex and since then she has been more stable.  Yesterday dialysis was cut short.  Objective   Blood pressure 125/72, pulse 61, temperature 99.2 F (37.3 C), temperature source Axillary, resp. rate (!) 22, height 5\' 2"  (1.575 m), weight 104.6 kg, SpO2 94 %.    Vent Mode: BIPAP;PCV FiO2 (%):  [100 %] 100 % Set Rate:  [10 bmp-15 bmp] 10 bmp PEEP:  [8 cmH20] 8 cmH20   Intake/Output Summary (Last 24 hours) at 02/25/2020 1230 Last data filed at 02/25/2020 1100 Gross per 24 hour  Intake 708.9 ml  Output 1000 ml  Net -291.1 ml   Filed Weights   02/24/20 1740 02/24/20 1759 02/25/20 0500  Weight: 109 kg 106.9 kg 104.6 kg   Morbidly obese female on BiPAP.  She actually looks better than she looked yesterday when I saw her.  She is restful on  the BiPAP.  Easily arouses.  And she focuses.  She looks also less tired.  Clear to auscultation bilaterally though distant breath sounds.  Normal hemodynamics.  She is on Precedex and Levophed drips.  Also hypoglycemic and D10 started and after that blood sugar adequate.  Resolved Hospital Problem list     Assessment & Plan:  COVID pneumonia  status post Decadron and remdesivir. -LDH 297 9/14, Ferritin 4405 9/19, CRP 1.7 9/23, Lactic 1.4 9/14, Procalcitonin 1.90 9/20    P: Monitor    Acute Hypoxic / Hypercapnic Respiratory Failure  -Secondary to above  -02/25/2020: BiPAP dependent.  Holding respiratory status with the help of Precedex  P:  Continuous BiPAP  -Head of bed greater than 30 degrees  -Intubate if worse  ESRD on HD  -Tunneled HD cath placed 9/22 -Finished 1.5hrs of iHD 9/24 before transfer to ICU  P: Per nephrology   Acute metabolic encephalopathy secondary to the above issues  -02/25/2020: [name] -Continue Precedex   Circulatory shock secondary to all of the above  02/25/2020: On Levophed  Plan -Levophed for MAP greater than 60 and systolic blood pressure greater than 95  Anemia of chronic disease/renal disease and critical illness  9/25 -no active bleeding  Plan -- PRBC for hgb </= 6.9gm%    - exceptions are   -  if ACS susepcted/confirmed then transfuse for hgb </= 8.0gm%,  or    -  active bleeding with hemodynamic instability, then transfuse regardless of hemoglobin value   At at all times try to transfuse 1 unit prbc as possible with exception of active hemorrhage  Empiric bacterial sepsis coverage  -Afebrile  Plan  - dc azithro  - cotniue cephalosprin  Type 2 diabetes  P: Continue long acting insulin  Continue SSI and meal coverage CBG q4 CBG goal 180   Best practice:  Diet:NPO Pain/Anxiety/Delirium protocol (if indicated): PRN VAP protocol (if indicated): N/A DVT prophylaxis: Subq heparin GI prophylaxis: PPI Glucose control: SSI Mobility: Bedrest  Code Status: Full Family Communication: family later  Disposition: Transfer to ICU      ATTESTATION & SIGNATURE   The patient Wanda Ramirez is critically ill with multiple organ systems failure and requires high complexity decision making for assessment and support, frequent evaluation and  titration of therapies, application of advanced monitoring technologies and extensive interpretation of multiple databases.   Critical Care Time devoted to patient care services described in this note is  40  Minutes. This time reflects time of care of this signee Dr Brand Males. This critical care time does not reflect procedure time, or teaching time or supervisory time of PA/NP/Med student/Med Resident etc but could involve care discussion time     Dr. Brand Males, M.D., [facility].C.P Pulmonary and Critical Care Medicine Staff Physician [facility] Pager: [phone], If no answer or between  15:00h - 7:00h: call [phone]  02/25/2020 12:30 PM     LABS    PULMONARY Recent Labs  Lab 02/24/20 2013  PHART 7.361  PCO2ART 47.8  PO2ART 67*  HCO3 27.0  TCO2 28  O2SAT 92.0    CBC Recent Labs  Lab 02/23/20 0843 02/23/20 0843 02/24/20 2013 02/24/20 2219 02/25/20 0231  HGB 7.6*   < > 8.8* 8.7* 8.4*  HCT 23.3*   < > 26.0* 27.0* 26.8*  WBC 16.2*  --   --  24.8* 23.3*  PLT 294  --   --  238 232   < > = values in this interval not displayed.    COAGULATION No results for input(s): INR in the last 168 hours.  CARDIAC  No results for input(s): TROPONINI in the last 168 hours. No results for input(s): PROBNP in the last 168 hours.   CHEMISTRY Recent Labs  Lab 02/20/20 0900 02/20/20 0900 02/21/20 0542 02/21/20 0542 02/22/20 0229 02/22/20 0229 02/23/20 0843 02/23/20 0843 02/24/20 1916 02/24/20 1916 02/24/20 2013 02/25/20 0231  NA 138   < > 137   < > 134*  --  138  --  140  --  141 141  K 3.2*   < > 4.0   < > 4.1   < > 3.4*   < > 3.9   < > 3.6 3.8  CL 98   < > 98  --  96*  --  101  --  101  --   --  101  CO2 25   < > 23  --  22  --  24  --  23  --   --  25  GLUCOSE 172*   < > 236*  --  383*  --  184*  --  111*  --   --  133*  BUN 68*   < > 88*  --  112*  --  48*  --  51*  --   --  57*  CREATININE 9.67*   <  > 12.22*  --  13.85*  --  7.22*  --  7.65*  --   --  8.50*  CALCIUM 8.7*   < > 8.3*  --  8.9  --  7.5*  --  8.3*  --   --  8.3*  MG 2.2  --   --   --   --   --   --   --  2.1  --   --   --   PHOS 6.0*   < > 7.1*  --  8.3*  --  4.2  --  4.0  --   --  5.3*   < > = values in this interval not displayed.   Estimated Creatinine Clearance: 9.1 mL/min (A) (by C-G formula based on SCr of 8.5 mg/dL (H)).   LIVER Recent Labs  Lab 02/20/20 0900 02/21/20 0542 02/22/20 0229 02/23/20 0843 02/25/20 0231  AST 36  --   --   --   --   ALT 23  --   --   --   --   ALKPHOS 140*  --   --   --   --   BILITOT 0.8  --   --   --   --   PROT 6.3*  --   --   --   --   ALBUMIN 2.5* 2.4* 2.9* 2.3* 2.2*     INFECTIOUS Recent Labs  Lab 02/20/20 0900 02/24/20 1916 02/24/20 2219 02/25/20 0632 02/25/20 0947  LATICACIDVEN  --  1.8  --  1.3 1.2  PROCALCITON 1.90  --  2.82  --   --      ENDOCRINE CBG (last 3)  Recent Labs    02/25/20 0805 02/25/20 0902 02/25/20 1108  GLUCAP 69* 118* 116*         IMAGING x48h  - image(s) personally visualized  -   highlighted in bold No results found.

## 2020-02-25 NOTE — Progress Notes (Signed)
Called eLink to notify them pt has become more agitated and restless... keeps taking her BiPap off... Awaiting for further instructions.

## 2020-02-25 NOTE — Progress Notes (Signed)
D10 @20ml /hr per Dr. Chase Caller.  Onnie Boer, PharmD, BCIDP, AAHIVP, CPP Infectious Disease Pharmacist 02/25/2020 8:40 AM

## 2020-02-25 NOTE — Progress Notes (Signed)
eLink Physician-Brief Progress Note Patient Name: THANA RAMP DOB: 1970-06-29 MRN: [id number]   Date of Service  02/25/2020  HPI/Events of Note  Patient with acute hypoxemic respiratory failure from Covid 19 pneumonia, along with agitated delirium.  eICU Interventions  Precedex infusion ordered.        Kerry Kass [place] Jeff 02/25/2020, 1:36 AM

## 2020-02-25 NOTE — Progress Notes (Addendum)
[facility] Progress Note Patient Name: Wanda Ramirez DOB: Nov 26, 1970 MRN: [id number]   Date of Service  02/25/2020  HPI/Events of Note  Hypotension.  eICU Interventions  Arterial line insertion for more reliable BP measurement, Norepinephrine infusion, NS 250 ml iv fluid bolus, PCCM ground crew requested to insert a central line for vasopressor delivery access. Stat serum lactic acid.        Kerry Kass Kima Malenfant 02/25/2020, 5:52 AM

## 2020-02-25 NOTE — Progress Notes (Signed)
Spent last 30 min trying to keep pt's BiPap on.... at times, she will not tolerate it.... Have kept on re-orienting pt back.

## 2020-02-25 NOTE — Progress Notes (Addendum)
Pt had another episode where she took off her bipap mask again. Pt did not seem to understand to keep her mask on. Sats dropped to 40s and HR also decreased to 40 before nursing staff was able to don PPE to place bipap mask onto patient. RN donned PPE and placed Bipap mask back on immediately and her O2 and HR normalized. Bilateral soft wrist restraints were then applied. Pt was awake during this, speaking and responding to commands but not oriented enough to keep her bipap mask on. Dr. Chase Caller informed and gave verbal order to leave bilateral soft wrist restraints on despite patient being on Bipap due to severe patient safety.  Pt's room is right next to nurses station with large glass windows. Curtain all of the way open. Pt visualized.

## 2020-02-25 NOTE — Progress Notes (Signed)
Called eLink to notify them that BP has been declining the last past 3 readings.... 0530 BP reading = 60/36... Awaiting further instructions.

## 2020-02-25 NOTE — Progress Notes (Signed)
[facility] Progress Note  Subjective:  Seen in ICU, sent to ICU yest for resp distress, on FM O2 now . BP's were low after HD yest into 70's. Off and on bipap.   Vitals:   02/25/20 0837 02/25/20 0900 02/25/20 1000 02/25/20 1100  BP: 118/62 109/67 107/79 125/72  Pulse:  65 67 61  Resp:  (!) 21 20 (!) 22  Temp:      TempSrc:      SpO2:  92% (!) 89% 94%  Weight:      Height:        Exam:   alert, nad , obese aaf, on bipap  no jvd  Chest coarse BS/ rhonchi bilat  Cor reg no RG  Abd soft ntnd no ascites   Ext no LE edema or UE edema   Alert, NF, ox3    LUA AVF+bruit     OP HD: TTS  4h 53min 450/800  2/2.25 bath  111.5kg  Hep 2000  L AVF  - darbe 50 ug q week, last 9/7  - calc 1.0 tiw  - 9/9 Hb 10.0, tsat 22%  Assessment/ Plan: # COVID pna w/ acute on chronic hypoxic respiratory failure:  On dexamethasone, status post tocilizumab and remdesivir.  Severe CXR changes. Resp status worsning,  moved to ICU on 9/24, on Bipap resp support.   # ESRD: TTS HD.  Shortened HD yest d/t resp issues. HD today to get back on schedule in ICU w/ pressor support as needed. No fluid removal (as below)  # Clotted LUA AVF: new TDC placed by IR 9/11. Will need new perm access at some point when pt is more stable.   # Volume/ hypotension: BP's low and down 6-7kg by wt's, pt looks dry, will keep + 500- 1000 cc w/ HD today.   # Anemia of Chronic Kidney Disease: Hemoglobin  8.7. Currently receiving aranesp 13mcg weekly outpt but will continue here at higher dose of 154mcg qtuesday.   # Secondary Hyperparathyroidism/Hyperphosphatemia:  Continue increased dose of Auryxia to 840 3 times daily.  Continue Sensipar 60 mg every other day  #DM2 uncontrolled with hyperglycemia: Hyperglycemia likely steroids contributing.  Management per primary team    Kelly Splinter, MD 02/25/2020, 11:45 AM     Recent Labs  Lab 02/23/20 0843 02/24/20 1916 02/24/20 2013 02/24/20 2013 02/24/20 2219  02/25/20 0231  K   < > 3.9 3.6  --   --  3.8  BUN  --  51*  --   --   --  57*  CREATININE  --  7.65*  --   --   --  8.50*  CALCIUM  --  8.3*  --   --   --  8.3*  PHOS  --  4.0  --   --   --  5.3*  HGB   < >  --  8.8*   < > 8.7* 8.4*   < > = values in this interval not displayed.   Inpatient medications: . acetaminophen  650 mg Oral Once  . Chlorhexidine Gluconate Cloth  6 each Topical Q0600  . cinacalcet  60 mg Oral QODAY  . darbepoetin (ARANESP) injection - DIALYSIS  100 mcg Intravenous Q Tue-HD  . ferric citrate  840 mg Oral TID WC  . heparin injection (subcutaneous)  5,000 Units Subcutaneous Q8H  . hydrocortisone   Rectal BID  . insulin aspart  0-20 Units Subcutaneous TID WC  . insulin aspart  10 Units Subcutaneous  TID WC  . insulin glargine  25 Units Subcutaneous BID  . levothyroxine  125 mcg Oral QAC breakfast  . mouth rinse  15 mL Mouth Rinse BID  . pantoprazole  80 mg Oral Daily  . pregabalin  75 mg Oral Daily  . sodium chloride flush  10-40 mL Intracatheter Q12H  . umeclidinium bromide  1 puff Inhalation Daily   . azithromycin Stopped (02/24/20 2120)  . ceFEPime (MAXIPIME) IV    . dexmedetomidine (PRECEDEX) IV infusion 0.8 mcg/kg/hr (02/25/20 1100)  . dextrose 20 mL/hr at 02/25/20 1100  . norepinephrine (LEVOPHED) Adult infusion 5 mcg/min (02/25/20 1100)   acetaminophen **OR** acetaminophen, albuterol, guaiFENesin-dextromethorphan, influenza vac split quadrivalent PF, sodium chloride flush

## 2020-02-26 ENCOUNTER — Inpatient Hospital Stay (HOSPITAL_COMMUNITY): Payer: Medicaid Other

## 2020-02-26 DIAGNOSIS — R579 Shock, unspecified: Secondary | ICD-10-CM

## 2020-02-26 LAB — COMPREHENSIVE METABOLIC PANEL
ALT: 7 U/L (ref 0–44)
AST: 43 U/L — ABNORMAL HIGH (ref 15–41)
Albumin: 2.2 g/dL — ABNORMAL LOW (ref 3.5–5.0)
Alkaline Phosphatase: 448 U/L — ABNORMAL HIGH (ref 38–126)
Anion gap: 15 (ref 5–15)
BUN: 45 mg/dL — ABNORMAL HIGH (ref 6–20)
CO2: 22 mmol/L (ref 22–32)
Calcium: 8.7 mg/dL — ABNORMAL LOW (ref 8.9–10.3)
Chloride: 102 mmol/L (ref 98–111)
Creatinine, Ser: 6.9 mg/dL — ABNORMAL HIGH (ref 0.44–1.00)
GFR calc Af Amer: 7 mL/min — ABNORMAL LOW (ref >60)
GFR calc non Af Amer: 6 mL/min — ABNORMAL LOW (ref >60)
Glucose, Bld: 133 mg/dL — ABNORMAL HIGH (ref 70–99)
Potassium: 4.6 mmol/L (ref 3.5–5.1)
Sodium: 139 mmol/L (ref 135–145)
Total Bilirubin: 0.8 mg/dL (ref 0.3–1.2)
Total Protein: 5.8 g/dL — ABNORMAL LOW (ref 6.5–8.1)

## 2020-02-26 LAB — POCT I-STAT 7, (LYTES, BLD GAS, ICA,H+H)
Acid-Base Excess: 0 mmol/L (ref 0.0–2.0)
Acid-base deficit: 2 mmol/L (ref 0.0–2.0)
Acid-base deficit: 2 mmol/L (ref 0.0–2.0)
Acid-base deficit: 2 mmol/L (ref 0.0–2.0)
Bicarbonate: 24.4 mmol/L (ref 20.0–28.0)
Bicarbonate: 24.8 mmol/L (ref 20.0–28.0)
Bicarbonate: 25.9 mmol/L (ref 20.0–28.0)
Bicarbonate: 27.9 mmol/L (ref 20.0–28.0)
Calcium, Ion: 1.18 mmol/L (ref 1.15–1.40)
Calcium, Ion: 1.23 mmol/L (ref 1.15–1.40)
Calcium, Ion: 1.25 mmol/L (ref 1.15–1.40)
Calcium, Ion: 1.25 mmol/L (ref 1.15–1.40)
HCT: 24 % — ABNORMAL LOW (ref 36.0–46.0)
HCT: 25 % — ABNORMAL LOW (ref 36.0–46.0)
HCT: 26 % — ABNORMAL LOW (ref 36.0–46.0)
HCT: 28 % — ABNORMAL LOW (ref 36.0–46.0)
Hemoglobin: 8.2 g/dL — ABNORMAL LOW (ref 12.0–15.0)
Hemoglobin: 8.5 g/dL — ABNORMAL LOW (ref 12.0–15.0)
Hemoglobin: 8.8 g/dL — ABNORMAL LOW (ref 12.0–15.0)
Hemoglobin: 9.5 g/dL — ABNORMAL LOW (ref 12.0–15.0)
O2 Saturation: 80 %
O2 Saturation: 88 %
O2 Saturation: 90 %
O2 Saturation: 90 %
Patient temperature: 100.2
Patient temperature: 100.6
Patient temperature: 97.5
Patient temperature: 97.6
Potassium: 4.2 mmol/L (ref 3.5–5.1)
Potassium: 4.6 mmol/L (ref 3.5–5.1)
Potassium: 4.9 mmol/L (ref 3.5–5.1)
Potassium: 5.1 mmol/L (ref 3.5–5.1)
Sodium: 138 mmol/L (ref 135–145)
Sodium: 138 mmol/L (ref 135–145)
Sodium: 138 mmol/L (ref 135–145)
Sodium: 139 mmol/L (ref 135–145)
TCO2: 26 mmol/L (ref 22–32)
TCO2: 26 mmol/L (ref 22–32)
TCO2: 28 mmol/L (ref 22–32)
TCO2: 30 mmol/L (ref 22–32)
pCO2 arterial: 44.9 mmHg (ref 32.0–48.0)
pCO2 arterial: 47.8 mmHg (ref 32.0–48.0)
pCO2 arterial: 68 mmHg (ref 32.0–48.0)
pCO2 arterial: 69.1 mmHg (ref 32.0–48.0)
pH, Arterial: 7.193 — CL (ref 7.350–7.450)
pH, Arterial: 7.22 — ABNORMAL LOW (ref 7.350–7.450)
pH, Arterial: 7.321 — ABNORMAL LOW (ref 7.350–7.450)
pH, Arterial: 7.342 — ABNORMAL LOW (ref 7.350–7.450)
pO2, Arterial: 47 mmHg — ABNORMAL LOW (ref 83.0–108.0)
pO2, Arterial: 57 mmHg — ABNORMAL LOW (ref 83.0–108.0)
pO2, Arterial: 75 mmHg — ABNORMAL LOW (ref 83.0–108.0)
pO2, Arterial: 78 mmHg — ABNORMAL LOW (ref 83.0–108.0)

## 2020-02-26 LAB — GLUCOSE, CAPILLARY
Glucose-Capillary: 124 mg/dL — ABNORMAL HIGH (ref 70–99)
Glucose-Capillary: 128 mg/dL — ABNORMAL HIGH (ref 70–99)
Glucose-Capillary: 138 mg/dL — ABNORMAL HIGH (ref 70–99)
Glucose-Capillary: 178 mg/dL — ABNORMAL HIGH (ref 70–99)
Glucose-Capillary: 214 mg/dL — ABNORMAL HIGH (ref 70–99)
Glucose-Capillary: 214 mg/dL — ABNORMAL HIGH (ref 70–99)

## 2020-02-26 LAB — CBC WITH DIFFERENTIAL/PLATELET
Abs Immature Granulocytes: 0.64 10*3/uL — ABNORMAL HIGH (ref 0.00–0.07)
Basophils Absolute: 0.1 10*3/uL (ref 0.0–0.1)
Basophils Relative: 0 %
Eosinophils Absolute: 0.4 10*3/uL (ref 0.0–0.5)
Eosinophils Relative: 1 %
HCT: 25.5 % — ABNORMAL LOW (ref 36.0–46.0)
Hemoglobin: 8.1 g/dL — ABNORMAL LOW (ref 12.0–15.0)
Immature Granulocytes: 2 %
Lymphocytes Relative: 4 %
Lymphs Abs: 1.1 10*3/uL (ref 0.7–4.0)
MCH: 29 pg (ref 26.0–34.0)
MCHC: 31.8 g/dL (ref 30.0–36.0)
MCV: 91.4 fL (ref 80.0–100.0)
Monocytes Absolute: 0.7 10*3/uL (ref 0.1–1.0)
Monocytes Relative: 2 %
Neutro Abs: 25.9 10*3/uL — ABNORMAL HIGH (ref 1.7–7.7)
Neutrophils Relative %: 91 %
Platelets: 168 10*3/uL (ref 150–400)
RBC: 2.79 MIL/uL — ABNORMAL LOW (ref 3.87–5.11)
RDW: 18.3 % — ABNORMAL HIGH (ref 11.5–15.5)
WBC: 28.8 10*3/uL — ABNORMAL HIGH (ref 4.0–10.5)
nRBC: 0.4 % — ABNORMAL HIGH (ref 0.0–0.2)

## 2020-02-26 LAB — RENAL FUNCTION PANEL
Albumin: 2.1 g/dL — ABNORMAL LOW (ref 3.5–5.0)
Anion gap: 20 — ABNORMAL HIGH (ref 5–15)
BUN: 53 mg/dL — ABNORMAL HIGH (ref 6–20)
CO2: 25 mmol/L (ref 22–32)
Calcium: 8.8 mg/dL — ABNORMAL LOW (ref 8.9–10.3)
Chloride: 98 mmol/L (ref 98–111)
Creatinine, Ser: 7.86 mg/dL — ABNORMAL HIGH (ref 0.44–1.00)
GFR calc Af Amer: 6 mL/min — ABNORMAL LOW (ref >60)
GFR calc non Af Amer: 5 mL/min — ABNORMAL LOW (ref >60)
Glucose, Bld: 206 mg/dL — ABNORMAL HIGH (ref 70–99)
Phosphorus: 8.1 mg/dL — ABNORMAL HIGH (ref 2.5–4.6)
Potassium: 5.4 mmol/L — ABNORMAL HIGH (ref 3.5–5.1)
Sodium: 143 mmol/L (ref 135–145)

## 2020-02-26 LAB — C-REACTIVE PROTEIN: CRP: 10.7 mg/dL — ABNORMAL HIGH (ref <1.0)

## 2020-02-26 LAB — D-DIMER, QUANTITATIVE: D-Dimer, Quant: >20 ug/mL-FEU — ABNORMAL HIGH (ref 0.00–0.50)

## 2020-02-26 LAB — APTT: aPTT: 31 seconds (ref 24–36)

## 2020-02-26 LAB — PROTIME-INR
INR: 1.3 — ABNORMAL HIGH (ref 0.8–1.2)
Prothrombin Time: 15.5 seconds — ABNORMAL HIGH (ref 11.4–15.2)

## 2020-02-26 LAB — LACTIC ACID, PLASMA: Lactic Acid, Venous: 1.7 mmol/L (ref 0.5–1.9)

## 2020-02-26 MED ORDER — ALTEPLASE 2 MG IJ SOLR: 2.0000 mg | Freq: Once | INTRAMUSCULAR | Status: DC | PRN

## 2020-02-26 MED ORDER — SODIUM CHLORIDE 0.9 % FOR CRRT: INTRAVENOUS_CENTRAL | Status: DC | PRN

## 2020-02-26 MED ORDER — SODIUM BICARBONATE 8.4 % IV SOLN
INTRAVENOUS | Status: AC
  Administered 2020-02-26: 50 meq
  Filled 2020-02-26: qty 50

## 2020-02-26 MED ORDER — DOCUSATE SODIUM 50 MG/5ML PO LIQD
100.0000 mg | Freq: Two times a day (BID) | ORAL | Status: DC
  Administered 2020-02-26 – 2020-03-27 (×49): 100 mg
  Filled 2020-02-26 (×51): qty 10

## 2020-02-26 MED ORDER — POLYETHYLENE GLYCOL 3350 17 G PO PACK
17.0000 g | PACK | Freq: Every day | ORAL | Status: DC
  Administered 2020-02-27 – 2020-03-02 (×5): 17 g
  Filled 2020-02-26 (×5): qty 1

## 2020-02-26 MED ORDER — SODIUM CHLORIDE 0.9 % IV SOLN
8.0000 mg/h | INTRAVENOUS | Status: DC
  Administered 2020-02-26 – 2020-02-28 (×6): 8 mg/h via INTRAVENOUS
  Filled 2020-02-26 (×7): qty 80

## 2020-02-26 MED ORDER — MIDAZOLAM HCL 2 MG/2ML IJ SOLN
2.0000 mg | INTRAMUSCULAR | Status: AC | PRN
  Administered 2020-02-26 – 2020-03-05 (×2): 2 mg via INTRAVENOUS
  Filled 2020-02-26 (×2): qty 2

## 2020-02-26 MED ORDER — SODIUM CHLORIDE 0.9 % IV SOLN
2.0000 g | Freq: Two times a day (BID) | INTRAVENOUS | Status: DC
  Administered 2020-02-26 – 2020-02-27 (×2): 2 g via INTRAVENOUS
  Filled 2020-02-26 (×2): qty 2

## 2020-02-26 MED ORDER — PANTOPRAZOLE SODIUM 40 MG IV SOLR
40.0000 mg | Freq: Two times a day (BID) | INTRAVENOUS | Status: DC
  Administered 2020-02-29 – 2020-03-08 (×17): 40 mg via INTRAVENOUS
  Filled 2020-02-26 (×18): qty 40

## 2020-02-26 MED ORDER — STERILE WATER FOR INJECTION IV SOLN
INTRAVENOUS | Status: DC
  Filled 2020-02-26 (×3): qty 150

## 2020-02-26 MED ORDER — PHENYLEPHRINE 40 MCG/ML (10ML) SYRINGE FOR IV PUSH (FOR BLOOD PRESSURE SUPPORT)
PREFILLED_SYRINGE | INTRAVENOUS | Status: AC
  Filled 2020-02-26: qty 20

## 2020-02-26 MED ORDER — PRISMASOL BGK 4/2.5 32-4-2.5 MEQ/L REPLACEMENT SOLN
Status: DC
  Filled 2020-02-26: qty 5000

## 2020-02-26 MED ORDER — LIP MEDEX EX OINT
TOPICAL_OINTMENT | CUTANEOUS | Status: DC | PRN
  Filled 2020-02-26 (×2): qty 7

## 2020-02-26 MED ORDER — MIDAZOLAM HCL 2 MG/2ML IJ SOLN
INTRAMUSCULAR | Status: AC
  Administered 2020-02-26: 2 mg via INTRAVENOUS
  Filled 2020-02-26: qty 2

## 2020-02-26 MED ORDER — ACETAMINOPHEN 160 MG/5ML PO SOLN
650.0000 mg | Freq: Four times a day (QID) | ORAL | Status: DC | PRN
  Administered 2020-02-26 – 2020-05-01 (×33): 650 mg
  Filled 2020-02-26 (×35): qty 20.3

## 2020-02-26 MED ORDER — FENTANYL CITRATE (PF) 100 MCG/2ML IJ SOLN
50.0000 ug | INTRAMUSCULAR | Status: DC | PRN
  Administered 2020-02-28: 50 ug via INTRAVENOUS
  Administered 2020-03-01 – 2020-03-06 (×4): 100 ug via INTRAVENOUS

## 2020-02-26 MED ORDER — PRISMASOL BGK 4/2.5 32-4-2.5 MEQ/L IV SOLN
INTRAVENOUS | Status: DC
  Filled 2020-02-26 (×10): qty 5000

## 2020-02-26 MED ORDER — FENTANYL CITRATE (PF) 100 MCG/2ML IJ SOLN
50.0000 ug | INTRAMUSCULAR | Status: DC | PRN
  Administered 2020-02-26: 50 ug via INTRAVENOUS
  Filled 2020-02-26 (×2): qty 2

## 2020-02-26 MED ORDER — FENTANYL CITRATE (PF) 100 MCG/2ML IJ SOLN: 50.0000 ug | INTRAMUSCULAR | Status: DC | PRN

## 2020-02-26 MED ORDER — HEPARIN SODIUM (PORCINE) 1000 UNIT/ML DIALYSIS: 1000.0000 [IU] | INTRAMUSCULAR | Status: DC | PRN

## 2020-02-26 MED ORDER — MIDAZOLAM 50MG/50ML (1MG/ML) PREMIX INFUSION
2.0000 mg/h | INTRAVENOUS | Status: DC
  Administered 2020-02-26: 4 mg/h via INTRAVENOUS
  Administered 2020-02-26 – 2020-03-03 (×26): 10 mg/h via INTRAVENOUS
  Administered 2020-03-04: 9 mg/h via INTRAVENOUS
  Administered 2020-03-04: 6 mg/h via INTRAVENOUS
  Administered 2020-03-04: 10 mg/h via INTRAVENOUS
  Filled 2020-02-26 (×32): qty 50

## 2020-02-26 MED ORDER — FENTANYL 2500MCG IN NS 250ML (10MCG/ML) PREMIX INFUSION
50.0000 ug/h | INTRAVENOUS | Status: DC
  Administered 2020-02-26 – 2020-03-03 (×17): 300 ug/h via INTRAVENOUS
  Administered 2020-03-04: 200 ug/h via INTRAVENOUS
  Administered 2020-03-04: 300 ug/h via INTRAVENOUS
  Administered 2020-03-04: 240 ug/h via INTRAVENOUS
  Administered 2020-03-06: 200 ug/h via INTRAVENOUS
  Filled 2020-02-26 (×21): qty 250

## 2020-02-26 MED ORDER — HEPARIN SODIUM (PORCINE) 1000 UNIT/ML DIALYSIS
1000.0000 [IU] | INTRAMUSCULAR | Status: DC | PRN
  Filled 2020-02-26: qty 6
  Filled 2020-02-26: qty 2

## 2020-02-26 MED ORDER — MIDAZOLAM BOLUS VIA INFUSION
1.0000 mg | INTRAVENOUS | Status: DC | PRN
  Administered 2020-02-29 – 2020-03-03 (×4): 2 mg via INTRAVENOUS
  Filled 2020-02-26: qty 2

## 2020-02-26 MED ORDER — LEVOTHYROXINE SODIUM 25 MCG PO TABS
125.0000 ug | ORAL_TABLET | Freq: Every day | ORAL | Status: DC
  Administered 2020-02-27 – 2020-05-09 (×71): 125 ug
  Filled 2020-02-26 (×72): qty 1

## 2020-02-26 MED ORDER — ORAL CARE MOUTH RINSE
15.0000 mL | OROMUCOSAL | Status: DC
  Administered 2020-02-26 – 2020-03-07 (×102): 15 mL via OROMUCOSAL

## 2020-02-26 MED ORDER — PHENYLEPHRINE HCL-NACL 10-0.9 MG/250ML-% IV SOLN
0.0000 ug/min | INTRAVENOUS | Status: DC
  Administered 2020-02-26 – 2020-02-29 (×3): 20 ug/min via INTRAVENOUS
  Administered 2020-03-01: 5 ug/min via INTRAVENOUS
  Filled 2020-02-26 (×5): qty 250

## 2020-02-26 MED ORDER — NOREPINEPHRINE 16 MG/250ML-% IV SOLN
0.0000 ug/min | INTRAVENOUS | Status: DC
  Administered 2020-02-26: 15 ug/min via INTRAVENOUS
  Administered 2020-02-26: 40 ug/min via INTRAVENOUS
  Administered 2020-02-27: 20 ug/min via INTRAVENOUS
  Administered 2020-02-27: 30 ug/min via INTRAVENOUS
  Administered 2020-02-28: 40 ug/min via INTRAVENOUS
  Administered 2020-02-28: 15 ug/min via INTRAVENOUS
  Administered 2020-02-29: 22 ug/min via INTRAVENOUS
  Administered 2020-02-29: 40 ug/min via INTRAVENOUS
  Administered 2020-02-29: 38 ug/min via INTRAVENOUS
  Administered 2020-03-01: 18 ug/min via INTRAVENOUS
  Administered 2020-03-01: 40 ug/min via INTRAVENOUS
  Administered 2020-03-02: 20 ug/min via INTRAVENOUS
  Administered 2020-03-02: 40 ug/min via INTRAVENOUS
  Administered 2020-03-03: 28 ug/min via INTRAVENOUS
  Administered 2020-03-03: 10 ug/min via INTRAVENOUS
  Filled 2020-02-26 (×16): qty 250

## 2020-02-26 MED ORDER — CISATRACURIUM BOLUS VIA INFUSION
0.1000 mg/kg | Freq: Once | INTRAVENOUS | Status: AC
  Administered 2020-02-26: 10.5 mg via INTRAVENOUS
  Filled 2020-02-26: qty 11

## 2020-02-26 MED ORDER — PRISMASOL BGK 4/2.5 32-4-2.5 MEQ/L REPLACEMENT SOLN
Status: DC
  Filled 2020-02-26 (×2): qty 5000

## 2020-02-26 MED ORDER — DEXTROSE-NACL 5-0.9 % IV SOLN: INTRAVENOUS | Status: DC

## 2020-02-26 MED ORDER — ARTIFICIAL TEARS OPHTHALMIC OINT
1.0000 "application " | TOPICAL_OINTMENT | Freq: Three times a day (TID) | OPHTHALMIC | Status: DC
  Administered 2020-02-26 – 2020-03-05 (×22): 1 via OPHTHALMIC
  Filled 2020-02-26 (×2): qty 3.5

## 2020-02-26 MED ORDER — SODIUM CHLORIDE 0.9 % IV SOLN
0.0000 ug/kg/min | INTRAVENOUS | Status: DC
  Administered 2020-02-26: 3 ug/kg/min via INTRAVENOUS
  Administered 2020-02-27: 1 ug/kg/min via INTRAVENOUS
  Administered 2020-02-28 – 2020-03-01 (×5): 2 ug/kg/min via INTRAVENOUS
  Administered 2020-03-02: 3 ug/kg/min via INTRAVENOUS
  Administered 2020-03-03: 2 ug/kg/min via INTRAVENOUS
  Filled 2020-02-26 (×10): qty 20

## 2020-02-26 MED ORDER — FENTANYL CITRATE (PF) 100 MCG/2ML IJ SOLN
50.0000 ug | Freq: Once | INTRAMUSCULAR | Status: AC
  Administered 2020-02-26: 50 ug via INTRAVENOUS

## 2020-02-26 MED ORDER — MIDAZOLAM HCL 2 MG/2ML IJ SOLN
2.0000 mg | INTRAMUSCULAR | Status: DC | PRN
  Administered 2020-02-26 – 2020-03-06 (×6): 2 mg via INTRAVENOUS
  Filled 2020-02-26 (×4): qty 2

## 2020-02-26 MED ORDER — FENTANYL 2500MCG IN NS 250ML (10MCG/ML) PREMIX INFUSION
50.0000 ug/h | INTRAVENOUS | Status: DC
  Administered 2020-02-26: 200 ug/h via INTRAVENOUS
  Filled 2020-02-26 (×3): qty 250

## 2020-02-26 MED ORDER — DOCUSATE SODIUM 50 MG/5ML PO LIQD: 100.0000 mg | Freq: Two times a day (BID) | ORAL | Status: DC

## 2020-02-26 MED ORDER — FENTANYL CITRATE (PF) 100 MCG/2ML IJ SOLN
INTRAMUSCULAR | Status: AC
  Administered 2020-02-26: 100 ug via INTRAVENOUS
  Filled 2020-02-26: qty 2

## 2020-02-26 MED ORDER — ONDANSETRON HCL 4 MG/2ML IJ SOLN
INTRAMUSCULAR | Status: AC
  Filled 2020-02-26: qty 2

## 2020-02-26 MED ORDER — STERILE WATER FOR INJECTION IV SOLN
INTRAVENOUS | Status: DC
  Filled 2020-02-26 (×2): qty 150

## 2020-02-26 MED ORDER — FENTANYL BOLUS VIA INFUSION
50.0000 ug | INTRAVENOUS | Status: DC | PRN
  Administered 2020-02-26 – 2020-03-06 (×5): 50 ug via INTRAVENOUS
  Filled 2020-02-26: qty 50

## 2020-02-26 MED ORDER — VASOPRESSIN 20 UNITS/100 ML INFUSION FOR SHOCK
0.0000 [IU]/min | INTRAVENOUS | Status: DC
  Administered 2020-02-26: 0.03 [IU]/min via INTRAVENOUS
  Administered 2020-02-27 – 2020-03-03 (×17): 0.04 [IU]/min via INTRAVENOUS
  Filled 2020-02-26 (×19): qty 100

## 2020-02-26 MED ORDER — CHLORHEXIDINE GLUCONATE 0.12% ORAL RINSE (MEDLINE KIT)
15.0000 mL | Freq: Two times a day (BID) | OROMUCOSAL | Status: DC
  Administered 2020-02-26 – 2020-03-11 (×28): 15 mL via OROMUCOSAL

## 2020-02-26 MED ORDER — SODIUM CHLORIDE 0.9 % IV SOLN
80.0000 mg | Freq: Once | INTRAVENOUS | Status: AC
  Administered 2020-02-26: 80 mg via INTRAVENOUS
  Filled 2020-02-26: qty 80

## 2020-02-26 NOTE — Progress Notes (Signed)
[facility] Progress Note Patient Name: MARANDA MARTE DOB: 01/09/1971 MRN: [id number]   Date of Service  02/26/2020  HPI/Events of Note  Patient became agitated with desaturation after being cleaned. Seen on NIPPV PC 12 over 8 PEEP, 100%, TV 500s BP 200/91 sats 80s with poor waveform On Precedex drip, also on norepinephrine  eICU Interventions  Patient seems to have calmed down with BP down to 138/84 but O2 remains 80s still with poor waveform  Increase PEEP to 12 and obtain ABG as well as CXR Elink to be informed once resulted        Raquel Sarna T Malya Cirillo 02/26/2020, 12:30 AM

## 2020-02-26 NOTE — Progress Notes (Signed)
Around 2230: Gave pt a bath and she became agitated, anxious and restless... Her O2 dropped to low 70s... Paged RT and RT replaced BiPap mask since it had a hole.... Pt still restless and agitated w/O2 in the low 80s despite changing out mask .... Called eLink and notified them... Was given VO to increase PEEP to 12 from 8 and to repeat ABG  No opioids since pt is allergic to Hydrocodone.

## 2020-02-26 NOTE — Progress Notes (Signed)
Called pharmacy and spoke w/James... Asked if Protonix IV gtt is compatible w/D10... Per Jeneen Rinks, it is ok for [place] to run together.

## 2020-02-26 NOTE — Progress Notes (Signed)
[facility] Progress Note  Subjective:  Seen in ICU, resp status declining and plan is for intubation soon this am.   Had HD yest, K 4.6 this am.   Vitals:   02/26/20 0500 02/26/20 0530 02/26/20 0600 02/26/20 0630  BP: 110/70 135/75 98/71 133/69  Pulse: 73 84 76 75  Resp: (!) 29 (!) 24 (!) 28 (!) 33  Temp:      TempSrc:      SpO2: (!) 88% (!) 88% 90% 92%  Weight: 105.3 kg     Height:        Exam:   alert, nad , obese aaf, on bipap  no jvd  Chest coarse BS/ rhonchi bilat  Cor reg no RG  Abd soft ntnd no ascites   Ext no LE edema or UE edema   Alert, NF, ox3    LUA AVF+bruit     OP HD: TTS  4h 58min 450/800  2/2.25 bath  111.5kg  Hep 2000  L AVF  - darbe 50 ug q week, last 9/7  - calc 1.0 tiw  - 9/9 Hb 10.0, tsat 22%  Assessment/ Plan: # COVID pna w/ acute on chronic hypoxic respiratory failure:  On dexamethasone, status post tocilizumab and remdesivir.  Severe CXR changes. Resp status continues to decline, for intubation this am.   # ESRD: TTS HD.  Had HD yesterday on schedule. She has a TDC in now in case CRRT will be needed (for shock typically w/ COVID intubation/ sedation) but no indications for this today.   # Clotted LUA AVF: new TDC placed by IR 9/11. Will need new perm access at some point when pt is more stable.   # Volume/ hypotension: BP's low, on pressor x 1, no edema, wt's are 5-6kg below dry and pt euvolemic to dry on exam.   # Anemia of Chronic Kidney Disease: Hemoglobin  8.7. Currently receiving aranesp 36mcg weekly outpt but will continue here at higher dose of 14mcg qtuesday.   # Secondary Hyperparathyroidism/Hyperphosphatemia:  Continue increased dose of Auryxia to 840 3 times daily.  Continue Sensipar 60 mg every other day  #DM2 - management per primary team    Kelly Splinter, MD 02/26/2020, 8:54 AM     Recent Labs  Lab 02/24/20 1916 02/24/20 2013 02/25/20 0231 02/26/20 0024 02/26/20 0543 02/26/20 0709  K 3.9   < > 3.8    < > 4.6 4.6  BUN 51*   < > 57*  --   --  45*  CREATININE 7.65*   < > 8.50*  --   --  6.90*  CALCIUM 8.3*   < > 8.3*  --   --  8.7*  PHOS 4.0  --  5.3*  --   --   --   HGB  --    < > 8.4*   < > 9.5* 8.1*   < > = values in this interval not displayed.   Inpatient medications: . Chlorhexidine Gluconate Cloth  6 each Topical Q0600  . cinacalcet  60 mg Oral QODAY  . darbepoetin (ARANESP) injection - DIALYSIS  100 mcg Intravenous Q Tue-HD  . docusate  100 mg Per Tube BID  . fentaNYL      . fentaNYL (SUBLIMAZE) injection  50 mcg Intravenous Once  . ferric citrate  840 mg Oral TID WC  . heparin injection (subcutaneous)  5,000 Units Subcutaneous Q8H  . hydrocortisone   Rectal BID  . insulin aspart  0-20 Units Subcutaneous  TID WC  . insulin aspart  10 Units Subcutaneous TID WC  . insulin glargine  25 Units Subcutaneous BID  . [START ON 02/27/2020] levothyroxine  125 mcg Per Tube Q0600  . mouth rinse  15 mL Mouth Rinse BID  . midazolam      . ondansetron      . [START ON 02/29/2020] pantoprazole  40 mg Intravenous Q12H  . phenylephrine      . polyethylene glycol  17 g Per Tube Daily  . sodium bicarbonate      . sodium bicarbonate      . sodium chloride flush  10-40 mL Intracatheter Q12H  . umeclidinium bromide  1 puff Inhalation Daily   . ceFEPime (MAXIPIME) IV 2 g (02/25/20 1702)  . dextrose Stopped (02/25/20 1849)  . fentaNYL infusion INTRAVENOUS    . norepinephrine (LEVOPHED) Adult infusion 6 mcg/min (02/26/20 0600)  . pantoprozole (PROTONIX) infusion 8 mg/hr (02/26/20 0600)   albuterol, fentaNYL, fentaNYL (SUBLIMAZE) injection, fentaNYL (SUBLIMAZE) injection, heparin, influenza vac split quadrivalent PF, midazolam, midazolam, sodium chloride flush

## 2020-02-26 NOTE — Progress Notes (Signed)
Called eLink to notify them that pt has had x2 black tarry stools w/foul smell... Concerned for poss GI bleed.... waiting for further instructions.

## 2020-02-26 NOTE — Progress Notes (Signed)
Sister Delora Jacobo -> called . Said daughters do not fully understand medical issues. Delora works with covid patients. She said daughters on other line ([place]).  Updated about ARDS - and hypoxemia, circulatory shock.. consented for arterial line. Explained outcomes 1) of survival; 2) decline in status and death; 3) or LTAC. Explained that chance of #1 is very poor. But explained we are very aggressive with life support, antibiotics   Also, discussed issues of why patientt decline despite vaccination and Mab   10 min conversaton    SIGNATURE    Dr. Brand Males, M.D., F.C.C.P,  Pulmonary and Critical Care Medicine Staff Physician, [facility] Director - Interstitial Lung Disease  Program  P[facility] [place] [facility] [place], [place], [address]  Pager: [phone], If no answer  OR between  19:00-7:00h: page [phone] Telephone (clinical office): [phone] Telephone (research): [phone]  10:07 AM 02/26/2020

## 2020-02-26 NOTE — Progress Notes (Signed)
PHARMACY NOTE:  ANTIMICROBIAL RENAL DOSAGE ADJUSTMENT  Current antimicrobial regimen includes a mismatch between antimicrobial dosage and estimated renal function.  As per policy approved by the Pharmacy & Therapeutics and Medical Executive Committees, the antimicrobial dosage will be adjusted accordingly.  Current antimicrobial dosage:  Cefepime 2 g IV qHD  Indication: HCAP  Renal Function: Starting on CRRT   Estimated Creatinine Clearance: 11.2 mL/min (A) (by C-G formula based on SCr of 6.9 mg/dL (H)). []      On intermittent HD, scheduled: [x]      On CRRT    Antimicrobial dosage has been changed to:  Cefepime 2g IV every 12 hours while on CRRT  Additional comments:   Thank you for allowing pharmacy to be a part of this patient's care.  Antonietta Jewel, PharmD, [facility] Clinical Pharmacist  Phone: [phone] 02/26/2020 3:19 PM  Please check AMION for all [place] [facility] phone numbers After 10:00 PM, call [place] [phone]

## 2020-02-26 NOTE — Progress Notes (Signed)
NAME:  Wanda Ramirez, MRN:  [id number], DOB:  1971/03/14, LOS: 19 ADMISSION DATE:  02/13/2020, CONSULTATION DATE: 02/24/2020 REFERRING MD: Dr. Heber Monrovia, CHIEF COMPLAINT: Increasing supplemental oxygen demand  Brief History    Wanda Ramirez is a 49 year old female with a past medical history significant for end-stage renal disease on hemodialysis MWF, chronic hypoxic respiratory failure on 3 L nasal cannula at baseline, sleep apnea, diabetes, hypertension, hyperlipidemia, hypothyroidism, and asthma who presented to the emergency department with complaints of worsening shortness of breath, dry cough, generalized body aches, and loose stools.  Patient was diagnosed 9/9 with Covid pneumonia.  Initially she was able to maintain outpatient status but when symptoms worsened 9/14 she presented to the emergency department.  Patient reported dyspnea and weakness worsened to the point where she was unable to ambulate effectively.  She also reports she was unable to maintain adequate oral intake.  Patient received the maternal vaccines in March and April of this year.  Patient presented initially febrile with a temperature of 102.9 and mildly tachypneic with all other vital signs within normal limits.  She was initially managed on 4 L nasal cannula.  Lab work significant for NA 130, K3.8, glucose 454, anion gap 21, lactic acid 1.8, LDH 296, ferritin 6690, CRP 13.3, and procalcitonin 10.51  On review of medical record it appears patient has progressively required increased supplemental oxygen requirement.  Afternoon of 9/24 patient had episode of desaturation requiring further increase in supplemental oxygen.  On chart assessment it appears patient has been placed on 40 L with reported increased work of breathing prompting PCCM consultation  Past Medical History  Sleep apnea Morbid obesity Hypothyroidism Hypertension GERD  End-stage renal disease on HD MWF Diabetes Asthma   Immunization History   Administered Date(s) Administered  . Influenza,inj,Quad PF,6+ Mos 02/09/2018     Significant Hospital Events   Admitted 9/14  9/24 - Lying in bed on side currently undergoing iHD,  She is lethargic but will arouse to verbal stimuli but quickly falls back to sleep.  RN states patient has had progressive decline over the last 12hrs  9/25 - -currently in medical ICU to [facility].  Bed 12.  She has been on BiPAP with Precedex overnight.  This morning on the low-dose of Precedex she got agitated.?  Also had melena.  She also desaturated.  Nursing then increase her Precedex and since then she has been more stable.  Yesterday dialysis was cut short.  Consults:  PCCM  Procedures:  9/23 IR guided tunneled HD cath placement - left subclavian  Significant Diagnostic Tests:  Pulmonary VQ scan at 9/21 > negative  Micro Data:  COVID 9/14 >  Blood cultures 9/13 > negative  Antimicrobials:  Cefepime 9/24 > Azithromycin 9/21 > 9/26 Dexamethasone Remdesivir completed 9/17 > 9/19 Tocilizumab x1  Interim history/subjective:    02/26/2020 - worsening resp distress. CXR diffuse air space disease. She is complaining of profound air hunger and tiredness,. Protonix gtt started for repeat melena  Objective   Blood pressure 133/69, pulse 75, temperature 97.6 F (36.4 C), temperature source Axillary, resp. rate (!) 33, height 5\' 2"  (1.575 m), weight 105.3 kg, SpO2 92 %.    Vent Mode: PCV;BIPAP FiO2 (%):  [100 %] 100 % Set Rate:  [10 bmp] 10 bmp PEEP:  [8 cmH20-12 cmH20] 12 cmH20   Intake/Output Summary (Last 24 hours) at 02/26/2020 0747 Last data filed at 02/26/2020 0600 Gross per 24 hour  Intake 1705.04 ml  Output -500 ml  Net 2205.04 ml   Filed Weights   02/25/20 1430 02/25/20 1758 02/26/20 0500  Weight: 104.6 kg 104.6 kg 105.3 kg  General Appearance:  Looks criticall ill OBESE - + Head:  Normocephalic, without obvious abnormality, atraumatic Eyes:  PERRL - yes, conjunctiva/corneas -  muddy     Ears:  Normal external ear canals, both ears Nose:  G tube - no Throat:  ETT TUBE - no , OG tube - no Neck:  Supple,  No enlargement/tenderness/nodules Lungs: Distant. RR 30, paradoxus +, Looking more tired Heart:  S1 and S2 normal, no murmur, CVP - x.  Pressors -  Abdomen:  Soft, no masses, no organomegaly Genitalia / Rectal:  Not done Extremities:  Extremities- intact Skin:  ntact in exposed areas . Sacral area - not examined Neurologic:  Sedation - none -> RASS - +1 . Moves all 4s - yes. CAM-ICU - neg . Orientation - yes      Resolved Hospital Problem list     Assessment & Plan:  COVID pneumonia status post Decadron and remdesivir. -LDH 297 9/14, Ferritin 4405 9/19, CRP 1.7 9/23, Lactic 1.4 9/14, Procalcitonin 1.90 9/20  P: Monitor    Acute Hypoxic / Hypercapnic Respiratory Failure  -Secondary to above   02/26/2020 - meets indication for intubation. Significant resp distress despite HD 9/25   P: - Intuate - PRVC - ARDS  - VAP bundle - deep sedation - might need nimbex   ESRD on HD  -Tunneled HD cath placed 9/22 -Finished 1.5hrs of iHD 9/24 before transfer to ICU . Rpeat HD 9/25 but has not helped resp status   P: Per nephrology -CRRT only if BP issues   Acute metabolic encephalopathy secondary to the above issues  9/26 - waxing and waning  Plan -disContinue Precedex - fent gtt and versed prn after intubation (allergy to fent but is itching) - RASS -4 due to ALI/ARDS  physiiology  Circulatory shock secondary to all of the above  02/26/2020: On Levophed  Plan -Levophed for MAP greater than 60 and systolic blood pressure greater than 95 - see if will improve after dc [precedex  Anemia of chronic disease/renal disease and critical illness  9/26 - reports of melena and protonix gtt started  Plan -- PRBC for hgb </= 6.9gm%    - exceptions are   -  if ACS susepcted/confirmed then transfuse for hgb </= 8.0gm%,  or    -  active bleeding  with hemodynamic instability, then transfuse regardless of hemoglobin value   At at all times try to transfuse 1 unit prbc as possible with exception of active hemorrhage  Empiric bacterial sepsis coverage  -Afebrile  Plan  - dc azithro  - cotniue cephalosprin  Type 2 diabetes  P: Continue long acting insulin  Continue SSI and meal coverage CBG q4 CBG goal 180   Best practice:  Diet:NPO Pain/Anxiety/Delirium protocol (if indicated): PRN VAP protocol (if indicated): N/A DVT prophylaxis: Subq heparin GI prophylaxis: PPI Glucose control: SSI  Mobility: Bedrest   Code Status: Full  Family Communication: called Mikaili Flippin [phone] - Daughter. She is over 18. Updated her of above. Explained most likely reason is worsening ALI/ARDS. She wanted me to then call Sofie Hartigan [phone] -> explain that futture course is of high mortality and morbidity If she were to survive and time to home could be months if she were to survivce  Disposition: Transfer to ICU       ATTESTATION &  SIGNATURE   The patient Wanda Ramirez is critically ill with multiple organ systems failure and requires high complexity decision making for assessment and support, frequent evaluation and titration of therapies, application of advanced monitoring technologies and extensive interpretation of multiple databases.   Critical Care Time devoted to patient care services described in this note is 45  Minutes. This time reflects time of care of this signee Dr Brand Males. This critical care time does not reflect procedure time, or teaching time or supervisory time of PA/NP/Med student/Med Resident etc but could involve care discussion time     Dr. Brand Males, M.D., [facility].C.P Pulmonary and Critical Care Medicine Staff Physician [place] [facility] Pager: [phone], If no answer or between  15:00h - 7:00h: call [phone]  02/26/2020 8:14  AM      LABS    PULMONARY Recent Labs  Lab 02/24/20 2013 02/26/20 0024 02/26/20 0543  PHART 7.361 7.321* 7.342*  PCO2ART 47.8 47.8 44.9  PO2ART 67* 47* 57*  HCO3 27.0 24.8 24.4  TCO2 28 26 26   O2SAT 92.0 80.0 88.0    CBC Recent Labs  Lab 02/23/20 0843 02/24/20 2013 02/24/20 2219 02/24/20 2219 02/25/20 0231 02/26/20 0024 02/26/20 0543  HGB 7.6*   < > 8.7*   < > 8.4* 8.8* 9.5*  HCT 23.3*   < > 27.0*   < > 26.8* 26.0* 28.0*  WBC 16.2*  --  24.8*  --  23.3*  --   --   PLT 294  --  238  --  232  --   --    < > = values in this interval not displayed.    COAGULATION No results for input(s): INR in the last 168 hours.  CARDIAC  No results for input(s): TROPONINI in the last 168 hours. No results for input(s): PROBNP in the last 168 hours.   CHEMISTRY Recent Labs  Lab 02/20/20 0900 02/20/20 0900 02/21/20 0542 02/21/20 0542 02/22/20 0229 02/22/20 0229 02/23/20 0843 02/23/20 0843 02/24/20 1916 02/24/20 1916 02/24/20 2013 02/24/20 2013 02/25/20 0231 02/25/20 0231 02/26/20 0024 02/26/20 0543  NA 138   < > 137   < > 134*   < > 138   < > 140  --  141  --  141  --  138 138  K 3.2*   < > 4.0   < > 4.1   < > 3.4*   < > 3.9   < > 3.6   < > 3.8   < > 4.2 4.6  CL 98   < > 98  --  96*  --  101  --  101  --   --   --  101  --   --   --   CO2 25   < > 23  --  22  --  24  --  23  --   --   --  25  --   --   --   GLUCOSE 172*   < > 236*  --  383*  --  184*  --  111*  --   --   --  133*  --   --   --   BUN 68*   < > 88*  --  112*  --  48*  --  51*  --   --   --  57*  --   --   --  CREATININE 9.67*   < > 12.22*  --  13.85*  --  7.22*  --  7.65*  --   --   --  8.50*  --   --   --   CALCIUM 8.7*   < > 8.3*  --  8.9  --  7.5*  --  8.3*  --   --   --  8.3*  --   --   --   MG 2.2  --   --   --   --   --   --   --  2.1  --   --   --   --   --   --   --   PHOS 6.0*   < > 7.1*  --  8.3*  --  4.2  --  4.0  --   --   --  5.3*  --   --   --    < > = values in this interval not  displayed.   Estimated Creatinine Clearance: 9.1 mL/min (A) (by C-G formula based on SCr of 8.5 mg/dL (H)).   LIVER Recent Labs  Lab 02/20/20 0900 02/21/20 0542 02/22/20 0229 02/23/20 0843 02/25/20 0231  AST 36  --   --   --   --   ALT 23  --   --   --   --   ALKPHOS 140*  --   --   --   --   BILITOT 0.8  --   --   --   --   PROT 6.3*  --   --   --   --   ALBUMIN 2.5* 2.4* 2.9* 2.3* 2.2*     INFECTIOUS Recent Labs  Lab 02/20/20 0900 02/24/20 1916 02/24/20 2219 02/25/20 0632 02/25/20 0947  LATICACIDVEN  --  1.8  --  1.3 1.2  PROCALCITON 1.90  --  2.82  --   --      ENDOCRINE CBG (last 3)  Recent Labs    02/25/20 2019 02/26/20 0019 02/26/20 0423  GLUCAP 108* 138* 128*         IMAGING x48h  - image(s) personally visualized  -   highlighted in bold DG Chest Port 1 View  Result Date: 02/26/2020 CLINICAL DATA:  Respiratory distress EXAM: PORTABLE CHEST 1 VIEW COMPARISON:  02/21/2020 FINDINGS: Diffuse bilateral pulmonary consolidation progressing since previous study. This could be due to multifocal pneumonia, edema, or ARDS. A left central venous catheter has been placed with tip over the low SVC region, likely near the brachiocephalic origin. No pleural effusions. No pneumothorax. Heart size is obscured by the parenchymal process. IMPRESSION: Progression of diffuse bilateral pulmonary consolidation. Electronically Signed   By: Lucienne Capers M.D.   On: 02/26/2020 02:25

## 2020-02-26 NOTE — Progress Notes (Signed)
Pt placed in the prone position with the left arm up and head facing the left without any complications. Her tube was taped in place and mepilex was placed on her face under the tape she is 88% at this time. RT will continue to monitor.

## 2020-02-26 NOTE — Progress Notes (Signed)
STAFF NOTE:    S: Date of admit 02/13/2020 with LOS 12 for today 02/26/2020 : Wanda Ramirez is  -multiple interventions throughout the course of the day.  She says progressive worsening status overall.  She is in circulatory shock with Levophed.  She is on 100% FiO2 PEEP of 18 and the PF ratio is less than 120.  She is in extreme respiratory acidosis and metabolic acidosis.  CRRT has been commenced by renal after our discussions with renal.  They are also adding bicarb to the  O:  Blood pressure (!) 87/53, pulse (!) 115, temperature (!) 100.6 F (38.1 C), temperature source Axillary, resp. rate (!) 30, height 5\' 2"  (1.575 m), weight 105.3 kg, SpO2 91 %.   Deeply sedated and paralyzed.  Pulse ox 92% on her percent FiO2 and PEEP of 18.  Synchronous with the ventilator.   LABS    PULMONARY Recent Labs  Lab 02/24/20 2013 02/26/20 0024 02/26/20 0543 02/26/20 1152 02/26/20 1742  PHART 7.361 7.321* 7.342* 7.220* 7.193*  PCO2ART 47.8 47.8 44.9 69.1* 68.0*  PO2ART 67* 47* 57* 75* 78*  HCO3 27.0 24.8 24.4 27.9 25.9  TCO2 28 26 26 30 28   O2SAT 92.0 80.0 88.0 90.0 90.0    CBC Recent Labs  Lab 02/24/20 2219 02/24/20 2219 02/25/20 0231 02/26/20 0024 02/26/20 0709 02/26/20 1152 02/26/20 1742  HGB 8.7*   < > 8.4*   < > 8.1* 8.2* 8.5*  HCT 27.0*   < > 26.8*   < > 25.5* 24.0* 25.0*  WBC 24.8*  --  23.3*  --  28.8*  --   --   PLT 238  --  232  --  168  --   --    < > = values in this interval not displayed.    COAGULATION Recent Labs  Lab 02/26/20 0709  INR 1.3*    CARDIAC  No results for input(s): TROPONINI in the last 168 hours. No results for input(s): PROBNP in the last 168 hours.   CHEMISTRY Recent Labs  Lab 02/20/20 0900 02/21/20 0542 02/22/20 0229 02/22/20 0229 02/23/20 0843 02/23/20 0843 02/24/20 1916 02/24/20 2013 02/25/20 0231 02/26/20 0024 02/26/20 0543 02/26/20 0543 02/26/20 0709 02/26/20 0709 02/26/20 1152 02/26/20 1152 02/26/20 1611  02/26/20 1742  NA 138   < > 134*   < > 138   < > 140   < > 141   < > 138  --  139  --  138  --  143 139  K 3.2*   < > 4.1   < > 3.4*   < > 3.9   < > 3.8   < > 4.6   < > 4.6   < > 4.9   < > 5.4* 5.1  CL 98   < > 96*   < > 101  --  101  --  101  --   --   --  102  --   --   --  98  --   CO2 25   < > 22   < > 24  --  23  --  25  --   --   --  22  --   --   --  25  --   GLUCOSE 172*   < > 383*   < > 184*  --  111*  --  133*  --   --   --  133*  --   --   --  206*  --   BUN 68*   < > 112*   < > 48*  --  51*  --  57*  --   --   --  45*  --   --   --  53*  --   CREATININE 9.67*   < > 13.85*   < > 7.22*  --  7.65*  --  8.50*  --   --   --  6.90*  --   --   --  7.86*  --   CALCIUM 8.7*   < > 8.9   < > 7.5*  --  8.3*  --  8.3*  --   --   --  8.7*  --   --   --  8.8*  --   MG 2.2  --   --   --   --   --  2.1  --   --   --   --   --   --   --   --   --   --   --   PHOS 6.0*   < > 8.3*  --  4.2  --  4.0  --  5.3*  --   --   --   --   --   --   --  8.1*  --    < > = values in this interval not displayed.   Estimated Creatinine Clearance: 9.9 mL/min (A) (by C-G formula based on SCr of 7.86 mg/dL (H)).   LIVER Recent Labs  Lab 02/20/20 0900 02/21/20 0542 02/22/20 0229 02/23/20 0843 02/25/20 0231 02/26/20 0709 02/26/20 1611  AST 36  --   --   --   --  43*  --   ALT 23  --   --   --   --  7  --   ALKPHOS 140*  --   --   --   --  448*  --   BILITOT 0.8  --   --   --   --  0.8  --   PROT 6.3*  --   --   --   --  5.8*  --   ALBUMIN 2.5*   < > 2.9* 2.3* 2.2* 2.2* 2.1*  INR  --   --   --   --   --  1.3*  --    < > = values in this interval not displayed.     INFECTIOUS Recent Labs  Lab 02/20/20 0900 02/24/20 1916 02/24/20 2219 02/25/20 0632 02/25/20 0947 02/26/20 0709  LATICACIDVEN  --    < >  --  1.3 1.2 1.7  PROCALCITON 1.90  --  2.82  --   --   --    < > = values in this interval not displayed.     ENDOCRINE CBG (last 3)  Recent Labs    02/26/20 0423 02/26/20 1151 02/26/20 1540   GLUCAP 128* 178* 214*         IMAGING x24h  - image(s) personally visualized  -   highlighted in bold DG CHEST PORT 1 VIEW  Result Date: 02/26/2020 CLINICAL DATA:  Central line placement. Acute respiratory failure. On ventilator. EXAM: PORTABLE CHEST 1 VIEW COMPARISON:  02/26/2020 FINDINGS: A new right jugular central venous catheter seen with tip overlying the distal SVC. Other support lines and tubes remain in stable position. No pneumothorax visualized. Severe diffuse bilateral pulmonary airspace disease shows no significant change. Mild cardiomegaly  is also stable. IMPRESSION: New right jugular central venous catheter in appropriate position. No pneumothorax visualized. Stable severe bilateral pulmonary airspace disease. Electronically Signed   By: Marlaine Hind M.D.   On: 02/26/2020 10:49   DG CHEST PORT 1 VIEW  Result Date: 02/26/2020 CLINICAL DATA:  Respiratory failure. EXAM: PORTABLE CHEST 1 VIEW COMPARISON:  02/26/2020 FINDINGS: An endotracheal tube with tip 1.5 cm above the carina, LEFT IJ central venous catheter with tip overlying the UPPER SVC and NG tube entering the stomach noted. Diffuse bilateral airspace opacities are again identified and not significantly changed. There is no evidence of pneumothorax. IMPRESSION: Unchanged diffuse bilateral airspace opacities. Endotracheal tube and NG tube placement. Electronically Signed   By: Margarette Canada M.D.   On: 02/26/2020 09:22   DG Chest Port 1 View  Result Date: 02/26/2020 CLINICAL DATA:  Respiratory distress EXAM: PORTABLE CHEST 1 VIEW COMPARISON:  02/21/2020 FINDINGS: Diffuse bilateral pulmonary consolidation progressing since previous study. This could be due to multifocal pneumonia, edema, or ARDS. A left central venous catheter has been placed with tip over the low SVC region, likely near the brachiocephalic origin. No pleural effusions. No pneumothorax. Heart size is obscured by the parenchymal process. IMPRESSION: Progression of  diffuse bilateral pulmonary consolidation. Electronically Signed   By: Lucienne Capers M.D.   On: 02/26/2020 02:25      A: Severe ARDS Circulatory shock COVID-19 Acidosis Anemia of critical disease  P: start prone ventilation Bic via CRRT  Called to Update sister Deloro Jacobo - [phone]  6:30 PM 02/26/2020 but said mailbox full    Anti-infectives (From admission, onward)   Start     Dose/Rate Route Frequency Ordered Stop   02/26/20 2200  ceFEPIme (MAXIPIME) 2 g in sodium chloride 0.9 % 100 mL IVPB        2 g 200 mL/hr over 30 Minutes Intravenous Every 12 hours 02/26/20 1519     02/25/20 1200  ceFEPIme (MAXIPIME) 2 g in sodium chloride 0.9 % 100 mL IVPB  Status:  Discontinued        2 g 200 mL/hr over 30 Minutes Intravenous Every T-Th-Sa (Hemodialysis) 02/25/20 0955 02/26/20 1519   02/24/20 1904  azithromycin (ZITHROMAX) 500 mg in sodium chloride 0.9 % 250 mL IVPB  Status:  Discontinued        500 mg 250 mL/hr over 60 Minutes Intravenous Every 24 hours 02/24/20 1904 02/26/20 0815   02/24/20 1900  azithromycin (ZITHROMAX) 250 mg in dextrose 5 % 125 mL IVPB  Status:  Discontinued        250 mg 125 mL/hr over 60 Minutes Intravenous Every 24 hours 02/24/20 1845 02/24/20 1904   02/24/20 1800  ceFEPIme (MAXIPIME) 2 g in sodium chloride 0.9 % 100 mL IVPB  Status:  Discontinued        2 g 200 mL/hr over 30 Minutes Intravenous Every Fri (Hemodialysis) 02/24/20 1326 02/25/20 0942   02/22/20 1745  ceFAZolin (ANCEF) IVPB 2g/100 mL premix        2 g 200 mL/hr over 30 Minutes Intravenous  Once 02/22/20 1744 02/22/20 1931   02/22/20 0600  ceFAZolin (ANCEF) IVPB 2g/100 mL premix        2 g 200 mL/hr over 30 Minutes Intravenous To Radiology 02/21/20 1658 02/22/20 1900   02/22/20 0000  ceFAZolin (ANCEF) IVPB 1 g/50 mL premix  Status:  Discontinued        1 g 100 mL/hr over 30 Minutes Intravenous To Radiology 02/21/20  1645 02/21/20 1658   02/21/20 1800  azithromycin (ZITHROMAX) tablet  250 mg  Status:  Discontinued       "Followed by" Linked Group Details   250 mg Oral Daily-1800 02/20/20 1658 02/24/20 1852   02/21/20 1800  ceFEPIme (MAXIPIME) 2 g in sodium chloride 0.9 % 100 mL IVPB  Status:  Discontinued        2 g 200 mL/hr over 30 Minutes Intravenous Every T-Th-Sa (1800) 02/20/20 1737 02/24/20 1326   02/20/20 1800  azithromycin (ZITHROMAX) tablet 500 mg       "Followed by" Linked Group Details   500 mg Oral Daily 02/20/20 1658 02/20/20 1731   02/20/20 1745  ceFEPIme (MAXIPIME) 1 g in sodium chloride 0.9 % 100 mL IVPB        1 g 200 mL/hr over 30 Minutes Intravenous STAT 02/20/20 1737 02/21/20 0747   02/19/20 1145  remdesivir 100 mg in sodium chloride 0.9 % 100 mL IVPB        100 mg 200 mL/hr over 30 Minutes Intravenous Daily 02/19/20 1131 02/19/20 1443   02/15/20 1000  remdesivir 100 mg in sodium chloride 0.9 % 100 mL IVPB  Status:  Discontinued       "Followed by" Linked Group Details   100 mg 200 mL/hr over 30 Minutes Intravenous Daily 02/14/20 0944 02/14/20 0951   02/15/20 1000  remdesivir 100 mg in sodium chloride 0.9 % 100 mL IVPB        100 mg 200 mL/hr over 30 Minutes Intravenous Daily 02/14/20 0952 02/17/20 1102   02/14/20 1030  remdesivir 100 mg in sodium chloride 0.9 % 100 mL IVPB        100 mg 200 mL/hr over 30 Minutes Intravenous Every 30 min 02/14/20 0952 02/14/20 1129   02/14/20 0945  remdesivir 200 mg in sodium chloride 0.9% 250 mL IVPB  Status:  Discontinued       "Followed by" Linked Group Details   200 mg 580 mL/hr over 30 Minutes Intravenous Once 02/14/20 0944 02/14/20 0951       Rest per NP/medical resident whose note is outlined above and that I agree with  The patient is critically ill with multiple organ systems failure and requires high complexity decision making for assessment and support, frequent evaluation and titration of therapies, application of advanced monitoring technologies and extensive interpretation of multiple databases.    Critical Care Time devoted to patient care services described in this note is  60  Minutes. This time reflects time of care of this signee Dr Brand Males. This critical care time does not reflect procedure time, or teaching time or supervisory time of PA/NP/Med student/Med Resident etc but could involve care discussion time     Dr. Brand Males, M.D., [facility].C.P Pulmonary and Critical Care Medicine Staff Physician [place] [facility] Pager: [phone], If no answer or between  15:00h - 7:00h: call [phone]  02/26/2020 6:46 PM

## 2020-02-26 NOTE — Progress Notes (Signed)
eLink Physician-Brief Progress Note Patient Name: Wanda Ramirez DOB: 12-26-1970 MRN: [id number]   Date of Service  02/26/2020  HPI/Events of Note  Notified of black tarry stool Already on Protonix 40 BID Sats 88-90% on increased PEEP of 12  eICU Interventions  Ordered to shift to protonix drip Stat CBC CXR and ABG still pending     Intervention Category Major Interventions: Hemorrhage - evaluation and management  Shona Needles Kimori Tartaglia 02/26/2020, 1:58 AM

## 2020-02-26 NOTE — Progress Notes (Signed)
Assisted family with tele-visit via elink 

## 2020-02-26 NOTE — Procedures (Signed)
Arterial Catheter Insertion Procedure Note MAURICIA MERTENS [id number] January 03, 1971  Procedure: Insertion of Arterial Catheter  Indications: Blood pressure monitoring and Frequent blood sampling  Procedure Details Consent: Risks of procedure as well as the alternatives and risks of each were explained to the (patient/caregiver).  Consent for procedure obtained. Time Out: Verified patient identification, verified procedure, site/side was marked, verified correct patient position, special equipment/implants available, medications/allergies/relevent history reviewed, required imaging and test results available.  Performed  Maximum sterile technique was used including antiseptics, cap, gloves, gown, hand hygiene, mask and sheet. Skin prep: Chlorhexidine; local anesthetic administered 20 gauge catheter was inserted into right radial artery using the Seldinger technique.  Evaluation Blood flow good; BP tracing good. Complications: No apparent complications. US guidance  [facility] Hal Norrington ACNP Maryanna Shape PCCM Pager [phone] till 3 pm If no answer page [phone] 02/26/2020, 11:14 AM

## 2020-02-26 NOTE — Progress Notes (Signed)
[facility] Progress Note Patient Name: Wanda Ramirez DOB: 1971-02-04 MRN: [id number]   Date of Service  02/26/2020  HPI/Events of Note  Notified patient maximum norepinephrine and vasopressin, about to be proned  eICU Interventions  May increased vasopressin to 0.04, will add neosynephrine in case additional pressor needed     Intervention Category Major Interventions: Hypotension - evaluation and management  Judd Lien 02/26/2020, 8:24 PM

## 2020-02-26 NOTE — Progress Notes (Signed)
Adding vasopressin drip per CCM team.  Onnie Boer, PharmD, BCIDP, AAHIVP, CPP Infectious Disease Pharmacist 02/26/2020 2:51 PM

## 2020-02-26 NOTE — Procedures (Signed)
Intubation Procedure Note  Wanda Ramirez  [id number]  09-Dec-1970  Date:02/26/20  Time:8:50 AM   Provider Performing:Oksana Deberry    Procedure: Intubation (31500)  Indication(s) Respiratory Failure  Consent Risks of the procedure as well as the alternatives and risks of each were explained to the patient and/or caregiver.  Consent for the procedure was obtained but is a verbal emergent informed consent  Anesthesia Etomidate, Versed, Fentanyl, Rocuronium and bic bolus. Patient already on levophed and prior precedex was stopped   Time Out Verified patient identification, verified procedure, site/side was marked, verified correct patient position, special equipment/implants available, medications/allergies/relevant history reviewed, required imaging and test results available.   Sterile Technique Usual hand hygeine, masks, and gloves were used   Procedure Description Patient positioned in bed supine.  Sedation given as noted above.  Patient was intubated with endotracheal tube using Glidescope.  View was Grade 2 only posterior commissure .  Number of attempts was 1.  Colorimetric CO2 detector was consistent with tracheal placement.   Complications/Tolerance Profound ARDS - hypoxemic and hypotensive post intubation as expected for ARDS   Chest X-ray is ordered to verify placement.   EBL none   Specimen(s) None     SIGNATURE    Dr. Brand Males, M.D., F.C.C.P,  Pulmonary and Critical Care Medicine Staff Physician, [place] Director - Interstitial Lung Disease  Program  Pulmonary [place] at [place], [place], [address]  Pager: [phone], If no answer  OR between  19:00-7:00h: page [phone] Telephone (clinical office): [phone] Telephone (research): [phone]  8:51 AM 02/26/2020

## 2020-02-26 NOTE — Procedures (Signed)
Central Venous Catheter Insertion Procedure Note  Wanda Ramirez  [id number]  June 21, 1970  Date:02/26/20  Time:9:30 AM   Provider Performing:Leonard Feigel   Procedure: Insertion of Non-tunneled Central Venous [phone]) with US guidance (24580)   Indication(s) Medication administration and Difficult access  Consent Risks of the procedure as well as the alternatives and risks of each were explained to the patient and/or caregiver.  Verbal informed Consent for the procedure was obtained due to emergent ntature Anesthesia Topical only with 1% lidocaine   Timeout Verified patient identification, verified procedure, site/side was marked, verified correct patient position, special equipment/implants available, medications/allergies/relevant history reviewed, required imaging and test results available.  Sterile Technique Maximal sterile technique including full sterile barrier drape, hand hygiene, sterile gown, sterile gloves, mask, hair covering, sterile ultrasound probe cover (if used).  Procedure Description Area of catheter insertion was cleaned with chlorhexidine and draped in sterile fashion.  With real-time ultrasound guidance a central venous catheter was placed into the left internal jugular vein. Nonpulsatile blood flow and easy flushing noted in all ports.  The catheter was sutured in place and sterile dressing applied.  Complications/Tolerance None; patient tolerated the procedure well. Chest X-ray is ordered to verify placement for internal jugular or subclavian cannulation.   Chest x-ray is not ordered for femoral cannulation.  EBL none  Specimen(s) None    SIGNATURE    Dr. Brand Males, M.D., F.C.C.P,  Pulmonary and Critical Care Medicine Staff Physician, [place] Director - Interstitial Lung Disease  Program  P[facility] at [place], [place], [address]  Pager: [phone], If  no answer  OR between  19:00-7:00h: page [phone] Telephone (clinical office): [phone] Telephone (research): [phone]  9:31 AM 02/26/2020

## 2020-02-26 NOTE — Progress Notes (Signed)
Called lab to get ETA on stat CBC... Erica sts she did not see it but will be here as soon as she can

## 2020-02-27 ENCOUNTER — Inpatient Hospital Stay (HOSPITAL_COMMUNITY): Payer: Medicaid Other

## 2020-02-27 HISTORY — PX: IR FLUORO GUIDE CV LINE LEFT: IMG2282

## 2020-02-27 LAB — GLUCOSE, CAPILLARY
Glucose-Capillary: 143 mg/dL — ABNORMAL HIGH (ref 70–99)
Glucose-Capillary: 180 mg/dL — ABNORMAL HIGH (ref 70–99)
Glucose-Capillary: 195 mg/dL — ABNORMAL HIGH (ref 70–99)
Glucose-Capillary: 205 mg/dL — ABNORMAL HIGH (ref 70–99)
Glucose-Capillary: 220 mg/dL — ABNORMAL HIGH (ref 70–99)
Glucose-Capillary: 237 mg/dL — ABNORMAL HIGH (ref 70–99)
Glucose-Capillary: 248 mg/dL — ABNORMAL HIGH (ref 70–99)

## 2020-02-27 LAB — HEPATIC FUNCTION PANEL
ALT: 17 U/L (ref 0–44)
AST: 91 U/L — ABNORMAL HIGH (ref 15–41)
Albumin: 1.9 g/dL — ABNORMAL LOW (ref 3.5–5.0)
Alkaline Phosphatase: 350 U/L — ABNORMAL HIGH (ref 38–126)
Bilirubin, Direct: 0.1 mg/dL (ref 0.0–0.2)
Indirect Bilirubin: 0.7 mg/dL (ref 0.3–0.9)
Total Bilirubin: 0.8 mg/dL (ref 0.3–1.2)
Total Protein: 5.4 g/dL — ABNORMAL LOW (ref 6.5–8.1)

## 2020-02-27 LAB — RENAL FUNCTION PANEL
Albumin: 1.9 g/dL — ABNORMAL LOW (ref 3.5–5.0)
Albumin: 1.8 g/dL — ABNORMAL LOW (ref 3.5–5.0)
Anion gap: 15 (ref 5–15)
Anion gap: 15 (ref 5–15)
BUN: 50 mg/dL — ABNORMAL HIGH (ref 6–20)
BUN: 63 mg/dL — ABNORMAL HIGH (ref 6–20)
CO2: 25 mmol/L (ref 22–32)
CO2: 23 mmol/L (ref 22–32)
Calcium: 8.3 mg/dL — ABNORMAL LOW (ref 8.9–10.3)
Calcium: 8.6 mg/dL — ABNORMAL LOW (ref 8.9–10.3)
Chloride: 100 mmol/L (ref 98–111)
Chloride: 102 mmol/L (ref 98–111)
Creatinine, Ser: 7.18 mg/dL — ABNORMAL HIGH (ref 0.44–1.00)
Creatinine, Ser: 8.16 mg/dL — ABNORMAL HIGH (ref 0.44–1.00)
GFR calc Af Amer: 7 mL/min — ABNORMAL LOW (ref >60)
GFR calc Af Amer: 6 mL/min — ABNORMAL LOW (ref >60)
GFR calc non Af Amer: 6 mL/min — ABNORMAL LOW (ref >60)
GFR calc non Af Amer: 5 mL/min — ABNORMAL LOW (ref >60)
Glucose, Bld: 231 mg/dL — ABNORMAL HIGH (ref 70–99)
Glucose, Bld: 264 mg/dL — ABNORMAL HIGH (ref 70–99)
Phosphorus: 9 mg/dL — ABNORMAL HIGH (ref 2.5–4.6)
Phosphorus: 10.1 mg/dL — ABNORMAL HIGH (ref 2.5–4.6)
Potassium: 5.1 mmol/L (ref 3.5–5.1)
Potassium: 5.7 mmol/L — ABNORMAL HIGH (ref 3.5–5.1)
Sodium: 140 mmol/L (ref 135–145)
Sodium: 140 mmol/L (ref 135–145)

## 2020-02-27 LAB — CBC
HCT: 23.2 % — ABNORMAL LOW (ref 36.0–46.0)
Hemoglobin: 7.3 g/dL — ABNORMAL LOW (ref 12.0–15.0)
MCH: 29.7 pg (ref 26.0–34.0)
MCHC: 31.5 g/dL (ref 30.0–36.0)
MCV: 94.3 fL (ref 80.0–100.0)
Platelets: 114 10*3/uL — ABNORMAL LOW (ref 150–400)
RBC: 2.46 MIL/uL — ABNORMAL LOW (ref 3.87–5.11)
RDW: 18.7 % — ABNORMAL HIGH (ref 11.5–15.5)
WBC: 32.3 10*3/uL — ABNORMAL HIGH (ref 4.0–10.5)
nRBC: 0.8 % — ABNORMAL HIGH (ref 0.0–0.2)

## 2020-02-27 LAB — MRSA PCR SCREENING: MRSA by PCR: NEGATIVE

## 2020-02-27 LAB — SAR COV2 SEROLOGY (COVID19)AB(IGG),IA: SARS-CoV-2 Ab, IgG: REACTIVE — AB

## 2020-02-27 LAB — LACTIC ACID, PLASMA: Lactic Acid, Venous: 1.6 mmol/L (ref 0.5–1.9)

## 2020-02-27 LAB — MAGNESIUM: Magnesium: 2.2 mg/dL (ref 1.7–2.4)

## 2020-02-27 LAB — APTT: aPTT: 29 seconds (ref 24–36)

## 2020-02-27 MED ORDER — HEPARIN SODIUM (PORCINE) 1000 UNIT/ML IJ SOLN
INTRAMUSCULAR | Status: AC
  Filled 2020-02-27: qty 1

## 2020-02-27 MED ORDER — HEPARIN SODIUM (PORCINE) 1000 UNIT/ML IJ SOLN
INTRAMUSCULAR | Status: DC | PRN
  Administered 2020-02-27: 4.2 mL via INTRAVENOUS

## 2020-02-27 MED ORDER — CHLORHEXIDINE GLUCONATE 4 % EX LIQD
CUTANEOUS | Status: AC
  Filled 2020-02-27: qty 15

## 2020-02-27 MED ORDER — HEPARIN (PORCINE) 2000 UNITS/L FOR CRRT
INTRAVENOUS_CENTRAL | Status: DC | PRN
  Filled 2020-02-27: qty 1000

## 2020-02-27 MED ORDER — SODIUM CHLORIDE 0.9 % IV SOLN
1.0000 g | INTRAVENOUS | Status: DC
  Administered 2020-02-28: 1 g via INTRAVENOUS
  Filled 2020-02-27: qty 1

## 2020-02-27 MED ORDER — PROSOURCE TF PO LIQD
45.0000 mL | Freq: Four times a day (QID) | ORAL | Status: DC
  Administered 2020-02-27 – 2020-03-12 (×50): 45 mL
  Filled 2020-02-27 (×45): qty 45

## 2020-02-27 MED ORDER — PRISMASOL BGK 0/2.5 32-2.5 MEQ/L IV SOLN
INTRAVENOUS | Status: DC
  Filled 2020-02-27 (×21): qty 5000

## 2020-02-27 MED ORDER — LIDOCAINE HCL 1 % IJ SOLN
INTRAMUSCULAR | Status: AC
  Filled 2020-02-27: qty 20

## 2020-02-27 MED ORDER — VITAL 1.5 CAL PO LIQD
1000.0000 mL | ORAL | Status: DC
  Administered 2020-02-27 – 2020-03-12 (×11): 1000 mL
  Filled 2020-02-27 (×5): qty 1000

## 2020-02-27 MED ORDER — PRISMASOL BGK 4/2.5 32-4-2.5 MEQ/L IV SOLN
INTRAVENOUS | Status: DC
  Filled 2020-02-27 (×9): qty 5000

## 2020-02-27 MED ORDER — B COMPLEX-C PO TABS
1.0000 | ORAL_TABLET | Freq: Every day | ORAL | Status: DC
  Administered 2020-02-27 – 2020-05-09 (×71): 1
  Filled 2020-02-27 (×70): qty 1

## 2020-02-27 MED ORDER — SODIUM ZIRCONIUM CYCLOSILICATE 10 G PO PACK
10.0000 g | PACK | Freq: Once | ORAL | Status: AC
  Administered 2020-02-27: 10 g
  Filled 2020-02-27: qty 1

## 2020-02-27 MED ORDER — STERILE WATER FOR INJECTION IV SOLN
INTRAVENOUS | Status: DC
  Filled 2020-02-27 (×10): qty 150

## 2020-02-27 MED ORDER — INSULIN ASPART 100 UNIT/ML ~~LOC~~ SOLN
0.0000 [IU] | SUBCUTANEOUS | Status: DC
  Administered 2020-02-27 (×2): 3 [IU] via SUBCUTANEOUS
  Administered 2020-02-28: 2 [IU] via SUBCUTANEOUS
  Administered 2020-02-28: 7 [IU] via SUBCUTANEOUS
  Administered 2020-02-28: 5 [IU] via SUBCUTANEOUS
  Administered 2020-02-28: 2 [IU] via SUBCUTANEOUS
  Administered 2020-02-28: 3 [IU] via SUBCUTANEOUS

## 2020-02-27 MED ORDER — ALTEPLASE 2 MG IJ SOLR
2.0000 mg | Freq: Once | INTRAMUSCULAR | Status: AC | PRN
  Administered 2020-03-02: 2 mg
  Filled 2020-02-27 (×3): qty 2

## 2020-02-27 MED ORDER — HEPARIN SODIUM (PORCINE) 1000 UNIT/ML DIALYSIS
1000.0000 [IU] | INTRAMUSCULAR | Status: DC | PRN
  Administered 2020-02-27 – 2020-02-28 (×2): 1000 [IU] via INTRAVENOUS_CENTRAL
  Filled 2020-02-27 (×4): qty 6

## 2020-02-27 MED ORDER — STERILE WATER FOR INJECTION IV SOLN
INTRAVENOUS | Status: DC
  Filled 2020-02-27 (×11): qty 150

## 2020-02-27 MED FILL — Vasopressin IV Soln 20 Unit/ML (For IV Infusion): INTRAVENOUS | Qty: 1 | Status: AC

## 2020-02-27 MED FILL — Sodium Chloride IV Soln 0.9%: INTRAVENOUS | Qty: 100 | Status: AC

## 2020-02-27 NOTE — Progress Notes (Signed)
Pts head turned to the right and right arm placed up beside the head without any complications. Pts head appears to be faced down but her ETT tube isn't kinked and it's patent, pt is very stiff and it's hard to get her head positioned correctly on that side. RT will continue to monitor.

## 2020-02-27 NOTE — Progress Notes (Signed)
PHARMACY NOTE:  ANTIMICROBIAL RENAL DOSAGE ADJUSTMENT  Current antimicrobial regimen includes a mismatch between antimicrobial dosage and estimated renal function.  As per policy approved by the Pharmacy & Therapeutics and Medical Executive Committees, the antimicrobial dosage will be adjusted accordingly.  Current antimicrobial dosage:  Cefepime 2 g IV q12h  Indication: HCAP   Renal Function: Patient unable to tolerate CRRT while prone position. CRRT currently on hold with plans to re-attempt when supine.   Estimated Creatinine Clearance: 10.8 mL/min (A) (by C-G formula based on SCr of 7.18 mg/dL (H)). []      On intermittent HD, scheduled: [x]      On CRRT - BUT inconsistently due to not tolerating while proned    Antimicrobial dosage has been changed to:  Cefepime 1g IV every 24 hours for now until ensure CRRT on and tolerating. If able to tolerate CRRT for 8-16hrs, may need to adjust to q12 hour dosing.   Additional comments: Will need to monitor tolerance of CRRT and plans for proning to determine best regimen   Thank you for allowing pharmacy to be a part of this patient's care.  Sloan Leiter, PharmD, BCPS, BCCCP Clinical Pharmacist Please refer to [facility] for [place] numbers 02/27/2020 9:44 AM  Please check AMION for all [place] phone numbers After 10:00 PM, call [place] [phone]

## 2020-02-27 NOTE — Progress Notes (Signed)
K+ 5.7 results called to Dr Royce Macadamia .Orders received for [facility]

## 2020-02-27 NOTE — Progress Notes (Signed)
Transported patient to 3M06 on vent and full suport with 100% FIO2. Tolerated well.

## 2020-02-27 NOTE — Progress Notes (Signed)
Assisted tele visit to patient with family member.  Wanda Escandon McEachran, RN  

## 2020-02-27 NOTE — Progress Notes (Addendum)
Nutrition Follow-up / Consult  DOCUMENTATION CODES:   Morbid obesity  INTERVENTION:   Initiate tube feeding via OG tube: Vital 1.5 at 50 ml/h (1200 ml per day) Prosource TF 45 ml QID  Provides 1960 kcal, 125 gm protein, 917 ml free water daily  Per ASPEN guidelines, okay to continue gastric feedings during prone positioning, keeping HOB elevated (reverse trendelenburg) to at least 10-25 degrees.   Add B complex with vitamin C while on CRRT  NUTRITION DIAGNOSIS:   Inadequate oral intake related to inability to eat as evidenced by NPO status.  Ongoing   GOAL:   Provide needs based on ASPEN/SCCM guidelines  Progressing   MONITOR:   Vent status, Labs, TF tolerance  REASON FOR ASSESSMENT:   Consult Assessment of nutrition requirement/status  ASSESSMENT:   49 year old female with a past medical history significant for end-stage renal disease on hemodialysis MWF, chronic hypoxic respiratory failure on 3 L nasal cannula at baseline, sleep apnea, diabetes, hypertension, hyperlipidemia, hypothyroidism, and asthma presents with complaints of worsening shortness of breath. Previously diagnosed with COVID PNA on 9/9.  Patient required intubation on 9/26 due to severe ARDS. Patient was proned at 23:00 9/26, will be unproned at 15:00 today.  Last iHD 9/25. CRRT to be attempted when patient is supine because she has not been tolerating it while in prone position.  Patient is currently intubated on ventilator support MV: 12 L/min Temp (24hrs), Avg:99.5 F (37.5 C), Min:97.7 F (36.5 C), Max:101.6 F (38.7 C)   108 kg on admission Currently 105.9 kg  Labs reviewed. BUN 50, creat 7.18, phos 9, alk phos 350 CBG: [phone]  Medications reviewed and include phenylephrine, vasopressin, norepinephrine, sensipar QOD.  IVF: D5 NS at 20 ml/h  Nutrition focused physical exam not completed.  Diet Order:   Diet Order            Diet NPO time specified  Diet effective now                  EDUCATION NEEDS:   Not appropriate for education at this time  Skin:  Skin Assessment: Reviewed RN Assessment  Last BM:  9/26 type 6  Height:   Ht Readings from Last 1 Encounters:  02/14/20 5' 2" (1.575 m)    Weight:   Wt Readings from Last 1 Encounters:  02/27/20 105.9 kg    Ideal Body Weight:  50 kg  BMI:  Body mass index is 42.7 kg/m.  Estimated Nutritional Needs:   Kcal:  1850-2100  Protein:  125 gm  Fluid:  1 L + UOP    Lucas Mallow, RD, LDN, CNSC Please refer to Amion for contact information.

## 2020-02-27 NOTE — Progress Notes (Signed)
[facility] Progress Note  Subjective:   Has been intubated and in the ICU.  She didn't tolerate CRRT while prone - s/p multiple attempts; line compressed.  She is still prone and currently plans are to attempt while supine.  Has been on levo at 30 mcg/min and on vaso.    Review of systems;  Unable to obtain 2/2 ams     Vitals:   02/27/20 0600 02/27/20 0615 02/27/20 0630 02/27/20 0645  BP: (!) 94/59     Pulse:   (!) 104 (!) 103  Resp: (!) 30 (!) 30 (!) 30 (!) 29  Temp:      TempSrc:      SpO2:   98% 100%  Weight:      Height:        Exam: gen adult female proned intubated   Chest coarse breath sounds bilat posteriorly  tachy not auscultated as prone  Abd soft obese habitus limited by prone positioning   Ext no LE edema  Sedated and paralyzed    LUA AVF+bruit per charting; also with left IJ dialysis catheter per nursing report - unable to examine as prone   OP HD: TTS  4h 52mn 450/800  2/2.25 bath  111.5kg  Hep 2000  L AVF  - darbe 50 ug q week, last 9/7  - calc 1.0 tiw  - 9/9 Hb 10.0, tsat 22%  Assessment/ Plan: # COVID pna w/ acute on chronic hypoxic respiratory failure:  dexamethasone, status post tocilizumab and remdesivir.  Severe CXR changes.    # Acute hypoxic resp failure  - mechanical ventilation per primary team   # ESRD: TTS HD.  Attempting CRRT when supine; s/p HD on TTS schedule prior to intubation.  Given intolerance change to 2K for dialysate   # Clotted LUA AVF: note TDC placed by IR 9/11. Will need new perm access at some point when pt is more stable.   # Volume/ hypotension: hypotension; per charting wt's are several kg below dry with attempts to optimize resp status and pt euvolemic to dry on exam.    # Anemia of Chronic Kidney Disease:aranesp 100 mcg every tues  # Secondary Hyperparathyroidism/Hyperphosphatemia:  as plan for CRRT discontinue Auryxia and temp discontinued sensipar Sensipar 60 mg every other day  #DM2 -  management per primary team   Recent Labs  Lab 02/26/20 1152 02/26/20 1611 02/26/20 1742 02/27/20 0514 02/27/20 0533  K   < > 5.4* 5.1 5.1  --   BUN  --  53*  --  50*  --   CREATININE  --  7.86*  --  7.18*  --   CALCIUM  --  8.8*  --  8.3*  --   PHOS  --  8.1*  --  9.0*  --   HGB   < >  --  8.5*  --  7.3*   < > = values in this interval not displayed.   Inpatient medications: . artificial tears  1 application Both Eyes QA1P . chlorhexidine gluconate (MEDLINE KIT)  15 mL Mouth Rinse BID  . Chlorhexidine Gluconate Cloth  6 each Topical Q0600  . cinacalcet  60 mg Oral QODAY  . darbepoetin (ARANESP) injection - DIALYSIS  100 mcg Intravenous Q Tue-HD  . docusate  100 mg Per Tube BID  . ferric citrate  840 mg Oral TID WC  . heparin injection (subcutaneous)  5,000 Units Subcutaneous Q8H  . hydrocortisone   Rectal BID  . insulin  aspart  0-20 Units Subcutaneous TID WC  . insulin aspart  10 Units Subcutaneous TID WC  . insulin glargine  25 Units Subcutaneous BID  . levothyroxine  125 mcg Per Tube Q0600  . mouth rinse  15 mL Mouth Rinse BID  . mouth rinse  15 mL Mouth Rinse 10 times per day  . [START ON 02/29/2020] pantoprazole  40 mg Intravenous Q12H  . polyethylene glycol  17 g Per Tube Daily  . sodium chloride flush  10-40 mL Intracatheter Q12H  . umeclidinium bromide  1 puff Inhalation Daily   . ceFEPime (MAXIPIME) IV 2 g (02/26/20 2114)  . cisatracurium (NIMBEX) infusion 1.4 mcg/kg/min (02/26/20 1900)  . dextrose 5 % and 0.9% NaCl 20 mL/hr at 02/26/20 1900  . fentaNYL infusion INTRAVENOUS 300 mcg/hr (02/27/20 0337)  . midazolam 10 mg/hr (02/27/20 0311)  . norepinephrine (LEVOPHED) Adult infusion 30 mcg/min (02/27/20 0243)  . pantoprozole (PROTONIX) infusion 8 mg/hr (02/27/20 0201)  . phenylephrine (NEO-SYNEPHRINE) Adult infusion Stopped (02/27/20 0148)  . prismasol BGK 4/2.5 2,000 mL/hr at 02/27/20 0428  . sodium bicarbonate (isotonic) 1000 mL infusion 200 mL/hr at 02/27/20  0428  . sodium bicarbonate (isotonic) 1000 mL infusion 200 mL/hr at 02/27/20 0428  . vasopressin 0.04 Units/min (02/27/20 0202)   acetaminophen (TYLENOL) oral liquid 160 mg/5 mL, albuterol, alteplase, fentaNYL, fentaNYL (SUBLIMAZE) injection, fentaNYL (SUBLIMAZE) injection, heparin, heparin, influenza vac split quadrivalent PF, lip balm, midazolam, midazolam, midazolam, sodium chloride flush    Claudia Desanctis, MD 02/27/2020 7:04 AM

## 2020-02-27 NOTE — Progress Notes (Signed)
Patient Status: [facility] - In-pt  Assessment and Plan: Patient s/p tunneled HD catheter placement in IR 02/23/20, now non-functioning with need for ongoing dialysis/CRRT.  Patient with decline in respiratory status since placement of tunneled HD catheter 02/23/20. She has required intubation due to respiratory failure, was proned yesterday.  Now with inability to dialyze due to non-functioning catheter.  IR consulted for replacement of HD catheter.   PA to bedside.  Patient now supine.  Catheter in place, but non-functional.  On pressors, remains intubated.   Discussed with nephrology as well as CCM.   Patient is stable for transport to IR.  Risks and benefits discussed with the patient's daughter, Barnie Alderman,  including, but not limited to bleeding, infection, vascular injury, pneumothorax which may require chest tube placement, air embolism or even death  All of the patient's questions were answered, patient is agreeable to proceed. Consent signed and in chart.  ______________________________________________________________________   History of Present Illness: Wanda Ramirez is a 49 y.o. female with past medical history of ESRD on HD via LUA AVF.  She presented to [facility] with respiratory distress due to COVID PNA.  Her AVF was found to be non-functional.  IR consulted for fistulagram/declot, however she was unable to tolerate procedure due to tenuous respiratory status.  A tunneled HD catheter was placed 02/23/20 by Dr. Pascal Lux.  She had further progression of her respiratory failure, now intubated.  Her HD catheter was found to be non-functional.  IR consulted for exchange.  Allergies and medications reviewed.   Review of Systems: A 12 point ROS discussed and pertinent positives are indicated in the HPI above.  All other systems are negative.  Review of Systems  Unable to perform ROS: Acuity of condition    Vital Signs: BP (!) 106/51 Comment: aline reading  Pulse (!) 107   Temp 98.4 F  (36.9 C) (Axillary)   Resp (!) 30   Ht 5\' 2"  (1.575 m)   Wt 233 lb 7.5 oz (105.9 kg)   SpO2 96%   BMI 42.70 kg/m   Physical Exam Vitals and nursing note reviewed.  Constitutional:      General: She is not in acute distress.    Appearance: Normal appearance. She is ill-appearing.  Neck:     Comments: L IJ tunneled HD catheter in place.  Cardiovascular:     Rate and Rhythm: Normal rate and regular rhythm.  Pulmonary:     Comments: intubated Neurological:     Mental Status: She is alert.      Imaging reviewed.   Labs:  COAGS: Recent Labs    02/14/20 1216 02/26/20 0709 02/27/20 0514  INR 1.2 1.3*  --   APTT  --  31 29    BMP: Recent Labs    02/25/20 0231 02/26/20 0024 02/26/20 0709 02/26/20 0709 02/26/20 1152 02/26/20 1611 02/26/20 1742 02/27/20 0514  NA 141   < > 139   < > 138 143 139 140  K 3.8   < > 4.6   < > 4.9 5.4* 5.1 5.1  CL 101  --  102  --   --  98  --  100  CO2 25  --  22  --   --  25  --  25  GLUCOSE 133*  --  133*  --   --  206*  --  231*  BUN 57*  --  45*  --   --  53*  --  50*  CALCIUM 8.3*  --  8.7*  --   --  8.8*  --  8.3*  CREATININE 8.50*  --  6.90*  --   --  7.86*  --  7.18*  GFRNONAA 5*  --  6*  --   --  5*  --  6*  GFRAA 6*  --  7*  --   --  6*  --  7*   < > = values in this interval not displayed.       Electronically Signed: Docia Barrier, PA 02/27/2020, 4:12 PM   I spent a total of 15 minutes in face to face in clinical consultation, greater than 50% of which was counseling/coordinating care for venous access.

## 2020-02-27 NOTE — Procedures (Signed)
Interventional Radiology Procedure:   Indications: Poorly functioning dialysis catheter  Procedure: Tunneled dialysis catheter exchange  Findings: Tip of old catheter had pulled back.  New 28 cm tip to cuff Palindrome, tip in right atrium.  Both lumens aspirating and flushing well.  Complications: None     EBL: less than 10 ml  Plan: Catheter is ready to use.    Abrian Hanover R. Anselm Pancoast, MD  Pager: [phone]

## 2020-02-27 NOTE — Progress Notes (Signed)
Pt with multiple failed CRRT attempts this shift while proned.  Dr. Jonnie Finner made aware at this time.

## 2020-02-27 NOTE — Progress Notes (Signed)
Patient's head repositioned and arms rotated.  Patient tolerated well.  No skin breakdown noted at this time.    

## 2020-02-27 NOTE — Progress Notes (Signed)
NAME:  Wanda Ramirez, MRN:  [id number], DOB:  April 16, 1971, LOS: 34 ADMISSION DATE:  02/13/2020, CONSULTATION DATE: 02/24/2020 REFERRING MD: Dr. Heber Ramirez, CHIEF COMPLAINT: Increasing supplemental oxygen demand  Brief History    Wanda Ramirez is a 49 year old female with a past medical history significant for end-stage renal disease on hemodialysis MWF, chronic hypoxic respiratory failure on 3 L nasal cannula at baseline, sleep apnea, diabetes, hypertension, hyperlipidemia, hypothyroidism, and asthma who presented to the emergency department with complaints of worsening shortness of breath, dry cough, generalized body aches, and loose stools.  Patient was diagnosed 9/9 with Covid pneumonia.  Initially she was able to maintain outpatient status but when symptoms worsened 9/14 she presented to the emergency department.  Patient reported dyspnea and weakness worsened to the point where she was unable to ambulate effectively.  She also reports she was unable to maintain adequate oral intake.  Patient received the maternal vaccines in March and April of this year.  Patient presented initially febrile with a temperature of 102.9 and mildly tachypneic with all other vital signs within normal limits.  She was initially managed on 4 L nasal cannula.  Lab work significant for NA 130, K3.8, glucose 454, anion gap 21, lactic acid 1.8, LDH 296, ferritin 6690, CRP 13.3, and procalcitonin 10.51  On review of medical record it appears patient has progressively required increased supplemental oxygen requirement.  Afternoon of 9/24 patient had episode of desaturation requiring further increase in supplemental oxygen.  On chart assessment it appears patient has been placed on 40 L with reported increased work of breathing prompting PCCM consultation  Past Medical History  Sleep apnea Morbid obesity Hypothyroidism Hypertension GERD  End-stage renal disease on HD MWF Diabetes Asthma   Immunization History   Administered Date(s) Administered  . Influenza,inj,Quad PF,6+ Mos 02/09/2018     Significant Hospital Events   Admitted 9/14  9/24 - Lying in bed on side currently undergoing iHD,  She is lethargic but will arouse to verbal stimuli but quickly falls back to sleep.  RN states patient has had progressive decline over the last 12hrs  9/25 - -currently in medical ICU to [facility].  Bed 12.  She has been on BiPAP with Precedex overnight.  This morning on the low-dose of Precedex she got agitated.?  Also had melena.  She also desaturated.  Nursing then increase her Precedex and since then she has been more stable.  Yesterday dialysis was cut short.   9/26 -unable to run CRRT with prone ventilation.  Resume supine ventilation but still unable to draw blood back.  Consults:  PCCM  Procedures:  9/23 IR guided tunneled HD cath placement - left subclavian  Significant Diagnostic Tests:  Pulmonary VQ scan at 9/21 > negative  Micro Data:  COVID 9/14 >  Blood cultures 9/13 > negative  Antimicrobials:  Cefepime 9/24 > Azithromycin 9/21 > 9/26 Dexamethasone Remdesivir completed 9/17 > 9/19 Tocilizumab x1  Interim history/subjective:   Tolerated prone ventilation but unable to perform hemodialysis.  Objective   Blood pressure (!) 106/51, pulse (!) 107, temperature 98.2 F (36.8 C), temperature source Oral, resp. rate (!) 30, height 5\' 2"  (1.575 m), weight 105.9 kg, SpO2 96 %.    Vent Mode: PRVC FiO2 (%):  [65 %-100 %] 65 % Set Rate:  [30 bmp] 30 bmp Vt Set:  [400 mL] 400 mL PEEP:  [18 cmH20] 18 cmH20 Plateau Pressure:  [39 cmH20-40 cmH20] 39 cmH20   Intake/Output Summary (Last 24 hours) at 02/27/2020  1556 Last data filed at 02/27/2020 1500 Gross per 24 hour  Intake 3097.16 ml  Output 784 ml  Net 2313.16 ml   Filed Weights   02/25/20 1758 02/26/20 0500 02/27/20 0313  Weight: 104.6 kg 105.3 kg 105.9 kg  General Appearance:  Looks criticall ill OBESE - + Head:  Normocephalic,  without obvious abnormality, atraumatic Eyes:  PERRL - yes, conjunctiva/corneas - muddy     Throat: ET tube OG tube in place Neck:  Supple,  No enlargement/tenderness/nodules Lungs: Bronchial breathing left lung base. Heart:  S1 and S2 normal, no murmur, Abdomen:  Soft, no masses, no organomegaly Genitalia / Rectal:  Not done Extremities:  Extremities- intact Skin:  intact in exposed areas . Sacral area - not examined Neurologic: Sedated no response to stimulation.   Resolved Hospital Problem list     Assessment & Plan:   Critically ill due to acute Hypoxic / Hypercapnic Respiratory Failure requiring mechanical ventilation. Improvement with prone ventilation but unable to dialyze. -Supine ventilation resumed.  Decreasing FiO2 requirement.  Follow ABG.  COVID pneumonia status post Decadron and remdesivir. -Status post steroid, remdesivir, immunomodulating therapy.  ESRD on HD  -Tunneled HD cath placed 9/22 appears to be malfunctioning.  Will replace.   Best practice:  Diet:NPO Pain/Anxiety/Delirium protocol (if indicated): PRN VAP protocol (if indicated): N/A DVT prophylaxis: Subq heparin GI prophylaxis: PPI Glucose control: SSI Mobility: Bedrest Code Status: Full Family Communication: called Linsy Ehresman [phone] - Daughter. She is over 18. Updated her of above. Explained most likely reason is worsening ALI/ARDS. She wanted me to then call Sofie Hartigan [phone] -> explain that futture course is of high mortality and morbidity If she were to survive and time to home could be months if she were to survivce Disposition: Transfer to [place]  Lab 02/24/20 2013 02/26/20 0024 02/26/20 0543 02/26/20 1152 02/26/20 1742  PHART 7.361 7.321* 7.342* 7.220* 7.193*  PCO2ART 47.8 47.8 44.9 69.1* 68.0*  PO2ART 67* 47* 57* 75* 78*  HCO3 27.0 24.8 24.4 27.9 25.9  TCO2 28 26 26 30 28   O2SAT 92.0 80.0 88.0 90.0 90.0    CBC Recent Labs  Lab  02/25/20 0231 02/26/20 0024 02/26/20 0709 02/26/20 0709 02/26/20 1152 02/26/20 1742 02/27/20 0533  HGB 8.4*   < > 8.1*   < > 8.2* 8.5* 7.3*  HCT 26.8*   < > 25.5*   < > 24.0* 25.0* 23.2*  WBC 23.3*  --  28.8*  --   --   --  32.3*  PLT 232  --  168  --   --   --  114*   < > = values in this interval not displayed.    COAGULATION Recent Labs  Lab 02/26/20 0709  INR 1.3*    CARDIAC  No results for input(s): TROPONINI in the last 168 hours. No results for input(s): PROBNP in the last 168 hours.   CHEMISTRY Recent Labs  Lab 02/23/20 0843 02/23/20 0843 02/24/20 1916 02/24/20 2013 02/25/20 0231 02/26/20 0024 02/26/20 0709 02/26/20 0709 02/26/20 1152 02/26/20 1152 02/26/20 1611 02/26/20 1611 02/26/20 1742 02/27/20 0514  NA 138   < > 140   < > 141   < > 139  --  138  --  143  --  139 140  K 3.4*   < > 3.9   < > 3.8   < > 4.6   < > 4.9   < >  5.4*   < > 5.1 5.1  CL 101   < > 101  --  101  --  102  --   --   --  98  --   --  100  CO2 24   < > 23  --  25  --  22  --   --   --  25  --   --  25  GLUCOSE 184*   < > 111*  --  133*  --  133*  --   --   --  206*  --   --  231*  BUN 48*   < > 51*  --  57*  --  45*  --   --   --  53*  --   --  50*  CREATININE 7.22*   < > 7.65*  --  8.50*  --  6.90*  --   --   --  7.86*  --   --  7.18*  CALCIUM 7.5*   < > 8.3*  --  8.3*  --  8.7*  --   --   --  8.8*  --   --  8.3*  MG  --   --  2.1  --   --   --   --   --   --   --   --   --   --  2.2  PHOS 4.2  --  4.0  --  5.3*  --   --   --   --   --  8.1*  --   --  9.0*   < > = values in this interval not displayed.   Estimated Creatinine Clearance: 10.8 mL/min (A) (by C-G formula based on SCr of 7.18 mg/dL (H)).   LIVER Recent Labs  Lab 02/23/20 0843 02/25/20 0231 02/26/20 0709 02/26/20 1611 02/27/20 0514  AST  --   --  43*  --  91*  ALT  --   --  7  --  17  ALKPHOS  --   --  448*  --  350*  BILITOT  --   --  0.8  --  0.8  PROT  --   --  5.8*  --  5.4*  ALBUMIN 2.3* 2.2* 2.2*  2.1* 1.9*  1.9*  INR  --   --  1.3*  --   --      INFECTIOUS Recent Labs  Lab 02/24/20 2219 02/25/20 3664 02/25/20 0947 02/26/20 0709 02/27/20 0514  LATICACIDVEN  --    < > 1.2 1.7 1.6  PROCALCITON 2.82  --   --   --   --    < > = values in this interval not displayed.     ENDOCRINE CBG (last 3)  Recent Labs    02/27/20 0751 02/27/20 1137 02/27/20 1525  GLUCAP 220* 237* 248*         IMAGING x48h  - image(s) personally visualized  -   highlighted in bold DG CHEST PORT 1 VIEW  Result Date: 02/27/2020 CLINICAL DATA:  Intubation, COVID-19 EXAM: PORTABLE CHEST 1 VIEW COMPARISON:  Portable exam 1314 hours compared to 02/26/2020 FINDINGS: Tip of endotracheal tube projects approximately 3.6 cm above carina. Nasogastric tube extends into stomach. BILATERAL jugular central venous catheters with tips projecting over SVC. Normal heart size and mediastinal contours. Asymmetric pulmonary infiltrates RIGHT greater than LEFT unchanged. No gross pleural effusion or pneumothorax. IMPRESSION: Persistent BILATERAL pulmonary infiltrates consistent with  multifocal pneumonia and COVID-19, not significantly changed. Electronically Signed   By: Lavonia Dana M.D.   On: 02/27/2020 13:30   DG Chest Port 1 View  Result Date: 02/26/2020 CLINICAL DATA:  Endotracheal tube placement EXAM: PORTABLE CHEST 1 VIEW COMPARISON:  02/26/2020 FINDINGS: Endotracheal tube tip measures about 1.9 cm above the carina. Enteric tube tip is off the field of view but below the left hemidiaphragm. A right central venous catheter is present with tip overlying the upper SVC region. Left central venous catheter overlies the mid SVC region near the brachiocephalic origin diffuse bilateral airspace parenchymal infiltrates in the lungs compatible with multifocal pneumonia or possibly ARDS. No pleural effusions. No pneumothorax. Heart shadow is obscured. IMPRESSION: Appliances appear in satisfactory position. Persistent diffuse  bilateral airspace disease. Electronically Signed   By: Lucienne Capers M.D.   On: 02/26/2020 23:43   DG CHEST PORT 1 VIEW  Result Date: 02/26/2020 CLINICAL DATA:  Central line placement. Acute respiratory failure. On ventilator. EXAM: PORTABLE CHEST 1 VIEW COMPARISON:  02/26/2020 FINDINGS: A new right jugular central venous catheter seen with tip overlying the distal SVC. Other support lines and tubes remain in stable position. No pneumothorax visualized. Severe diffuse bilateral pulmonary airspace disease shows no significant change. Mild cardiomegaly is also stable. IMPRESSION: New right jugular central venous catheter in appropriate position. No pneumothorax visualized. Stable severe bilateral pulmonary airspace disease. Electronically Signed   By: Marlaine Hind M.D.   On: 02/26/2020 10:49   DG CHEST PORT 1 VIEW  Result Date: 02/26/2020 CLINICAL DATA:  Respiratory failure. EXAM: PORTABLE CHEST 1 VIEW COMPARISON:  02/26/2020 FINDINGS: An endotracheal tube with tip 1.5 cm above the carina, LEFT IJ central venous catheter with tip overlying the UPPER SVC and NG tube entering the stomach noted. Diffuse bilateral airspace opacities are again identified and not significantly changed. There is no evidence of pneumothorax. IMPRESSION: Unchanged diffuse bilateral airspace opacities. Endotracheal tube and NG tube placement. Electronically Signed   By: Margarette Canada M.D.   On: 02/26/2020 09:22   DG Chest Port 1 View  Result Date: 02/26/2020 CLINICAL DATA:  Respiratory distress EXAM: PORTABLE CHEST 1 VIEW COMPARISON:  02/21/2020 FINDINGS: Diffuse bilateral pulmonary consolidation progressing since previous study. This could be due to multifocal pneumonia, edema, or ARDS. A left central venous catheter has been placed with tip over the low SVC region, likely near the brachiocephalic origin. No pleural effusions. No pneumothorax. Heart size is obscured by the parenchymal process. IMPRESSION: Progression of diffuse  bilateral pulmonary consolidation. Electronically Signed   By: Lucienne Capers M.D.   On: 02/26/2020 02:25   DG Abd Portable 1V  Result Date: 02/27/2020 CLINICAL DATA:  Feeding tube placement EXAM: PORTABLE ABDOMEN - 1 VIEW COMPARISON:  Portable exam 1316 hours without priors for comparison FINDINGS: Tip of nasogastric tube projects over duodenal bulb. Visualized bowel gas pattern normal. Persistent pulmonary infiltrates consistent with multifocal pneumonia due to COVID-19. Linear lucency LEFT paraspinal at Q22-L7, uncertain etiology, may represent residual aeration at medial LEFT lung base versus inferior LEFT pneumomediastinum Osseous structures unremarkable. IMPRESSION: Persistent pulmonary infiltrates consistent with multifocal pneumonia and COVID-19. Tip of nasogastric tube projects over duodenal bulb. LEFT paraspinal lucency at T11-L1 question area at medial LEFT lung base versus inferior LEFT pneumomediastinum, favor pneumomediastinum; consider CT assessment. Findings called to Dr. Lynetta Mare on 02/27/2020 at 1338 hours. Electronically Signed   By: Lavonia Dana M.D.   On: 02/27/2020 13:38   CRITICAL CARE Performed by: Kipp Brood  Total critical care time: 60 minutes  Critical care time was exclusive of separately billable procedures and treating other patients.  Critical care was necessary to treat or prevent imminent or life-threatening deterioration.  Critical care was time spent personally by me on the following activities: development of treatment plan with patient and/or surrogate as well as nursing, discussions with consultants, evaluation of patient's response to treatment, examination of patient, obtaining history from patient or surrogate, ordering and performing treatments and interventions, ordering and review of laboratory studies, ordering and review of radiographic studies, pulse oximetry, re-evaluation of patient's condition and participation in multidisciplinary  rounds.  Kipp Brood, MD [facility] ICU Physician [place]  Pager: [phone] Mobile: [phone] After hours: [phone].

## 2020-02-27 NOTE — Plan of Care (Signed)
Received call from RN stating that she had contacted IR to replace tunneled.    I have placed the order for catheter exchange if she is able to go off the floor and is stable for same.   Paged pulm to discuss plans  Claudia Desanctis 1:59 PM 02/27/2020

## 2020-02-27 NOTE — Progress Notes (Signed)
RT NOTE: RT transported patient on ventilator from room 3M06 to IR and back to room 1P21 with no complications. Vitals are stable. RT will continue to monitor.

## 2020-02-27 NOTE — Progress Notes (Addendum)
I have informed Dr Royce Macadamia that CRRT cannot be restarted until HD cath is functioning . Orders to check renal profile at 1600. Also heparin held per orders anD will keep patient NPO for ir

## 2020-02-27 NOTE — Progress Notes (Signed)
Patient placed in supine position.  ETT secured.  No breakdown noted, however blister on inside of right bottom lip noted.  Patient tolerated well with no complications.

## 2020-02-27 NOTE — Progress Notes (Signed)
Attempt to withdraw heparin from tunneled HD cath unsuccessful. IR called for assistance and I was informed to have Dr Lynetta Mare place order for catheter exchange . I have informed Dr Lynetta Mare .

## 2020-02-27 NOTE — Progress Notes (Signed)
Returned to 18m06 from IR tolerated procedure well

## 2020-02-28 DIAGNOSIS — J80 Acute respiratory distress syndrome: Secondary | ICD-10-CM

## 2020-02-28 DIAGNOSIS — R6521 Severe sepsis with septic shock: Secondary | ICD-10-CM

## 2020-02-28 DIAGNOSIS — N186 End stage renal disease: Secondary | ICD-10-CM

## 2020-02-28 DIAGNOSIS — A419 Sepsis, unspecified organism: Secondary | ICD-10-CM

## 2020-02-28 LAB — GLUCOSE, CAPILLARY
Glucose-Capillary: 199 mg/dL — ABNORMAL HIGH (ref 70–99)
Glucose-Capillary: 241 mg/dL — ABNORMAL HIGH (ref 70–99)
Glucose-Capillary: 255 mg/dL — ABNORMAL HIGH (ref 70–99)
Glucose-Capillary: 261 mg/dL — ABNORMAL HIGH (ref 70–99)
Glucose-Capillary: 277 mg/dL — ABNORMAL HIGH (ref 70–99)
Glucose-Capillary: 282 mg/dL — ABNORMAL HIGH (ref 70–99)

## 2020-02-28 LAB — CBC
HCT: 18.1 % — ABNORMAL LOW (ref 36.0–46.0)
Hemoglobin: 5.6 g/dL — CL (ref 12.0–15.0)
MCH: 28.9 pg (ref 26.0–34.0)
MCHC: 30.9 g/dL (ref 30.0–36.0)
MCV: 93.3 fL (ref 80.0–100.0)
Platelets: 62 10*3/uL — ABNORMAL LOW (ref 150–400)
RBC: 1.94 MIL/uL — ABNORMAL LOW (ref 3.87–5.11)
RDW: 19 % — ABNORMAL HIGH (ref 11.5–15.5)
WBC: 26.3 10*3/uL — ABNORMAL HIGH (ref 4.0–10.5)
nRBC: 1.5 % — ABNORMAL HIGH (ref 0.0–0.2)

## 2020-02-28 LAB — POCT I-STAT 7, (LYTES, BLD GAS, ICA,H+H)
Acid-Base Excess: 7 mmol/L — ABNORMAL HIGH (ref 0.0–2.0)
Bicarbonate: 32.7 mmol/L — ABNORMAL HIGH (ref 20.0–28.0)
Calcium, Ion: 1.05 mmol/L — ABNORMAL LOW (ref 1.15–1.40)
HCT: 18 % — ABNORMAL LOW (ref 36.0–46.0)
Hemoglobin: 6.1 g/dL — CL (ref 12.0–15.0)
O2 Saturation: 89 %
Patient temperature: 93.8
Potassium: 4.3 mmol/L (ref 3.5–5.1)
Sodium: 135 mmol/L (ref 135–145)
TCO2: 34 mmol/L — ABNORMAL HIGH (ref 22–32)
pCO2 arterial: 47.2 mmHg (ref 32.0–48.0)
pH, Arterial: 7.437 (ref 7.350–7.450)
pO2, Arterial: 49 mmHg — ABNORMAL LOW (ref 83.0–108.0)

## 2020-02-28 LAB — RENAL FUNCTION PANEL
Albumin: 1.8 g/dL — ABNORMAL LOW (ref 3.5–5.0)
Albumin: 1.9 g/dL — ABNORMAL LOW (ref 3.5–5.0)
Anion gap: 16 — ABNORMAL HIGH (ref 5–15)
Anion gap: 18 — ABNORMAL HIGH (ref 5–15)
BUN: 55 mg/dL — ABNORMAL HIGH (ref 6–20)
BUN: 41 mg/dL — ABNORMAL HIGH (ref 6–20)
CO2: 26 mmol/L (ref 22–32)
CO2: 28 mmol/L (ref 22–32)
Calcium: 8.2 mg/dL — ABNORMAL LOW (ref 8.9–10.3)
Calcium: 7.8 mg/dL — ABNORMAL LOW (ref 8.9–10.3)
Chloride: 94 mmol/L — ABNORMAL LOW (ref 98–111)
Chloride: 92 mmol/L — ABNORMAL LOW (ref 98–111)
Creatinine, Ser: 6.38 mg/dL — ABNORMAL HIGH (ref 0.44–1.00)
Creatinine, Ser: 4.32 mg/dL — ABNORMAL HIGH (ref 0.44–1.00)
GFR calc Af Amer: 8 mL/min — ABNORMAL LOW (ref >60)
GFR calc Af Amer: 13 mL/min — ABNORMAL LOW (ref >60)
GFR calc non Af Amer: 7 mL/min — ABNORMAL LOW (ref >60)
GFR calc non Af Amer: 11 mL/min — ABNORMAL LOW (ref >60)
Glucose, Bld: 281 mg/dL — ABNORMAL HIGH (ref 70–99)
Glucose, Bld: 240 mg/dL — ABNORMAL HIGH (ref 70–99)
Phosphorus: 8.3 mg/dL — ABNORMAL HIGH (ref 2.5–4.6)
Phosphorus: 7.3 mg/dL — ABNORMAL HIGH (ref 2.5–4.6)
Potassium: 4.6 mmol/L (ref 3.5–5.1)
Potassium: 4.1 mmol/L (ref 3.5–5.1)
Sodium: 136 mmol/L (ref 135–145)
Sodium: 138 mmol/L (ref 135–145)

## 2020-02-28 LAB — POCT ACTIVATED CLOTTING TIME
Activated Clotting Time: 125 seconds
Activated Clotting Time: 142 seconds
Activated Clotting Time: 136 seconds
Activated Clotting Time: 158 seconds
Activated Clotting Time: 158 seconds
Activated Clotting Time: 164 seconds
Activated Clotting Time: 164 seconds
Activated Clotting Time: 175 seconds
Activated Clotting Time: 180 seconds
Activated Clotting Time: 186 seconds
Activated Clotting Time: 191 seconds
Activated Clotting Time: 191 seconds
Activated Clotting Time: 191 seconds
Activated Clotting Time: 191 seconds
Activated Clotting Time: 197 seconds

## 2020-02-28 LAB — HEMOGLOBIN AND HEMATOCRIT, BLOOD
HCT: 26.2 % — ABNORMAL LOW (ref 36.0–46.0)
Hemoglobin: 8.9 g/dL — ABNORMAL LOW (ref 12.0–15.0)

## 2020-02-28 LAB — ABO/RH: ABO/RH(D): O POS

## 2020-02-28 LAB — MAGNESIUM: Magnesium: 2.3 mg/dL (ref 1.7–2.4)

## 2020-02-28 LAB — APTT: aPTT: 31 seconds (ref 24–36)

## 2020-02-28 LAB — PREPARE RBC (CROSSMATCH)

## 2020-02-28 MED ORDER — DARBEPOETIN ALFA 150 MCG/0.3ML IJ SOSY
150.0000 ug | PREFILLED_SYRINGE | INTRAMUSCULAR | Status: DC
  Administered 2020-02-28 – 2020-03-27 (×5): 150 ug via INTRAVENOUS
  Filled 2020-02-28 (×10): qty 0.3

## 2020-02-28 MED ORDER — SODIUM CHLORIDE 0.9 % IV SOLN
250.0000 [IU]/h | INTRAVENOUS | Status: DC
  Administered 2020-02-28: 250 [IU]/h via INTRAVENOUS_CENTRAL
  Administered 2020-02-28: 1200 [IU]/h via INTRAVENOUS_CENTRAL
  Administered 2020-02-29: 1300 [IU]/h via INTRAVENOUS_CENTRAL
  Administered 2020-02-29 (×3): 1500 [IU]/h via INTRAVENOUS_CENTRAL
  Administered 2020-03-01: 1250 [IU]/h via INTRAVENOUS_CENTRAL
  Filled 2020-02-28 (×7): qty 2

## 2020-02-28 MED ORDER — INSULIN ASPART 100 UNIT/ML ~~LOC~~ SOLN
4.0000 [IU] | SUBCUTANEOUS | Status: DC
  Administered 2020-02-28 – 2020-02-29 (×7): 4 [IU] via SUBCUTANEOUS

## 2020-02-28 MED ORDER — PRISMASOL BGK 4/2.5 32-4-2.5 MEQ/L IV SOLN
INTRAVENOUS | Status: DC
  Filled 2020-02-28 (×16): qty 5000

## 2020-02-28 MED ORDER — SODIUM CHLORIDE 0.9 % IV SOLN
1.0000 g | Freq: Two times a day (BID) | INTRAVENOUS | Status: AC
  Administered 2020-02-28 – 2020-02-29 (×3): 1 g via INTRAVENOUS
  Filled 2020-02-28 (×3): qty 1

## 2020-02-28 MED ORDER — INSULIN ASPART 100 UNIT/ML ~~LOC~~ SOLN
0.0000 [IU] | SUBCUTANEOUS | Status: DC
  Administered 2020-02-28: 5 [IU] via SUBCUTANEOUS
  Administered 2020-02-29: 9 [IU] via SUBCUTANEOUS
  Administered 2020-02-29: 5 [IU] via SUBCUTANEOUS
  Administered 2020-02-29: 3 [IU] via SUBCUTANEOUS
  Administered 2020-02-29 (×2): 9 [IU] via SUBCUTANEOUS
  Administered 2020-02-29: 5 [IU] via SUBCUTANEOUS
  Administered 2020-02-29: 7 [IU] via SUBCUTANEOUS
  Administered 2020-03-01 (×2): 2 [IU] via SUBCUTANEOUS
  Administered 2020-03-01: 3 [IU] via SUBCUTANEOUS
  Administered 2020-03-01: 5 [IU] via SUBCUTANEOUS
  Administered 2020-03-01: 3 [IU] via SUBCUTANEOUS
  Administered 2020-03-01: 2 [IU] via SUBCUTANEOUS

## 2020-02-28 MED ORDER — HEPARIN BOLUS VIA INFUSION (CRRT)
1000.0000 [IU] | INTRAVENOUS | Status: DC | PRN
  Administered 2020-02-28: 1000 [IU] via INTRAVENOUS_CENTRAL
  Filled 2020-02-28: qty 1000

## 2020-02-28 MED ORDER — PRISMASOL BGK 4/2.5 32-4-2.5 MEQ/L REPLACEMENT SOLN
Status: DC
  Filled 2020-02-28 (×24): qty 5000

## 2020-02-28 MED ORDER — PRISMASOL BGK 4/2.5 32-4-2.5 MEQ/L REPLACEMENT SOLN
Status: DC
  Filled 2020-02-28 (×14): qty 5000

## 2020-02-28 MED ORDER — SODIUM CHLORIDE 0.9% IV SOLUTION: Freq: Once | INTRAVENOUS | Status: AC

## 2020-02-28 MED FILL — Sodium Chloride IV Soln 0.9%: INTRAVENOUS | Qty: 100 | Status: AC

## 2020-02-28 MED FILL — Vasopressin IV Soln 20 Unit/ML (For IV Infusion): INTRAVENOUS | Qty: 1 | Status: AC

## 2020-02-28 NOTE — Progress Notes (Signed)
Assisted tele visit to patient with daughter.  Winnie Umali M Wesson Stith, RN   

## 2020-02-28 NOTE — Progress Notes (Signed)
Assisted tele visit to patient with family member.  Wanda Zentner McEachran, RN  

## 2020-02-28 NOTE — Progress Notes (Signed)
Continue to have decreased SBP 89 despite 2 UPRBC and Levophed at 40 mcgs/min also keeping even on CRRT unable to remove fluid .Dr Shearon Stalls innformed. Will continue to monitor.

## 2020-02-28 NOTE — Progress Notes (Signed)
Hgb 5.6 reported to Dr Shearon Stalls

## 2020-02-28 NOTE — Progress Notes (Signed)
NAME:  Wanda Ramirez, MRN:  [id number], DOB:  [birth year], LOS: 32 ADMISSION DATE:  02/13/2020, CONSULTATION DATE: 02/24/2020 REFERRING MD: Dr. Heber [place], CHIEF COMPLAINT: Increasing supplemental oxygen demand  Brief History   Wanda Ramirez is a 49 year old female with a past medical history significant for end-stage renal disease on hemodialysis MWF, chronic hypoxic respiratory failure on 3 L nasal cannula at baseline, sleep apnea, diabetes, hypertension, hyperlipidemia, hypothyroidism, and asthma who presented to the emergency department with complaints of worsening shortness of breath, dry cough, generalized body aches, and loose stools.  Patient was diagnosed 9/9 with Covid pneumonia.  Initially she was able to maintain outpatient status but when symptoms worsened 9/14 she presented to the emergency department.  Patient reported dyspnea and weakness worsened to the point where she was unable to ambulate effectively.  She also reports she was unable to maintain adequate oral intake.  Patient received the moderna vaccines in March and April of this year.  Patient presented initially febrile with a temperature of 102.9 and mildly tachypneic with all other vital signs within normal limits.  She was initially managed on 4 L nasal cannula.  Lab work significant for NA 130, K3.8, glucose 454, anion gap 21, lactic acid 1.8, LDH 296, ferritin 6690, CRP 13.3, and procalcitonin 10.51  On review of medical record it appears patient has progressively required increased supplemental oxygen requirement.  Afternoon of 9/24 patient had episode of desaturation requiring further increase in supplemental oxygen.  On chart assessment it appears patient has been placed on 40 L with reported increased work of breathing prompting PCCM consultation  Past Medical History  Sleep apnea Morbid obesity Hypothyroidism Hypertension GERD  End-stage renal disease on HD MWF Diabetes Asthma   Immunization History   Administered Date(s) Administered  . Influenza,inj,Quad PF,6+ Mos 02/09/2018     Significant Hospital Events   Admitted 9/14  9/24 - Lying in bed on side currently undergoing iHD,  She is lethargic but will arouse to verbal stimuli but quickly falls back to sleep.  RN states patient has had progressive decline over the last 12hrs  9/25 - -currently in medical ICU to [facility].  Bed 12.  She has been on BiPAP with Precedex overnight.  This morning on the low-dose of Precedex she got agitated.?  Also had melena.  She also desaturated.  Nursing then increase her Precedex and since then she has been more stable.  Yesterday dialysis was cut short.   9/26 -unable to run CRRT with prone ventilation.  Resume supine ventilation but still unable to draw blood back.  9/27 transferred to 66m. IR replaced HD catheter  Consults:  PCCM  Procedures:  9/23 IR guided tunneled HD cath placement - left subclavian  Significant Diagnostic Tests:  Pulmonary VQ scan at 9/21 > negative  Micro Data:  COVID 9/14 >  Blood cultures 9/13 > negative  Antimicrobials:  Cefepime 9/24 > Azithromycin 9/21 > 9/26 Dexamethasone Remdesivir completed 9/17 > 9/19 Tocilizumab x1  Interim history/subjective:  Tolerating CRRT. Febrile overnight  Objective   Blood pressure (!) 136/57, pulse 90, temperature 97.7 F (36.5 C), temperature source Oral, resp. rate (!) 30, height 5\' 2"  (1.575 m), weight 111.9 kg, SpO2 98 %.    Vent Mode: PRVC FiO2 (%):  [55 %-65 %] 55 % Set Rate:  [30 bmp] 30 bmp Vt Set:  [400 mL] 400 mL PEEP:  [16 cmH20-18 cmH20] 16 cmH20 Plateau Pressure:  [37 cmH20-41 cmH20] 37 cmH20   Intake/Output Summary (Last 24  hours) at 02/28/2020 1121 Last data filed at 02/28/2020 1000 Gross per 24 hour  Intake 3080.97 ml  Output 1964 ml  Net 1116.97 ml   Filed Weights   02/26/20 0500 02/27/20 0313 02/28/20 0500  Weight: 105.3 kg 105.9 kg 111.9 kg  General Appearance:  Critically ill,  obese Throat: ETT to vent Lungs: sounds of mechanical ventilation auscultated Heart: tachycardic, regular Abdomen:  Obese Soft, no masses, no organomegaly Neurologic: Sedated no response to stimulation.   Resolved Hospital Problem list     Assessment & Plan:   Critically ill due to acute Hypoxic / Hypercapnic Respiratory Failure requiring mechanical ventilation. Severe ARDS secondary to COVID 19 Pneumonia - resume prone ventilation today given P/F ratio <100.  Will monitor response with serial ABGs - continue lung protective ventilation - continue NMB -Status post steroid, remdesivir, immunomodulating therapy.  ESRD on HD  -on CRRT - Nephrology following  Acute Anemia - likely component of chronic disease with blood loss - no obvious signs of bleeding - transfusing 2 units prbc  Septic Shock Unclear source Weaning vasopressors for goal MAP>65 Continue empiric cefepime  Best practice:  Diet:NPO Pain/Anxiety/Delirium protocol (if indicated): PRN VAP protocol (if indicated): N/A DVT prophylaxis: Subq heparin GI prophylaxis: PPI Glucose control: SSI Mobility: Bedrest Code Status: Full Family Communication: will update daughter Barnie Alderman today Disposition: Transfer to ICU   LABS    PULMONARY Recent Labs  Lab 02/26/20 0024 02/26/20 0543 02/26/20 1152 02/26/20 1742 02/28/20 0816  PHART 7.321* 7.342* 7.220* 7.193* 7.437  PCO2ART 47.8 44.9 69.1* 68.0* 47.2  PO2ART 47* 57* 75* 78* 49*  HCO3 24.8 24.4 27.9 25.9 32.7*  TCO2 26 26 30 28  34*  O2SAT 80.0 88.0 90.0 90.0 89.0    CBC Recent Labs  Lab 02/26/20 0709 02/26/20 1152 02/27/20 0533 02/28/20 0726 02/28/20 0816  HGB 8.1*   < > 7.3* 5.6* 6.1*  HCT 25.5*   < > 23.2* 18.1* 18.0*  WBC 28.8*  --  32.3* 26.3*  --   PLT 168  --  114* 62*  --    < > = values in this interval not displayed.    COAGULATION Recent Labs  Lab 02/26/20 0709  INR 1.3*    CARDIAC  No results for input(s): TROPONINI in the  last 168 hours. No results for input(s): PROBNP in the last 168 hours.   CHEMISTRY Recent Labs  Lab 02/24/20 1916 02/24/20 2013 02/25/20 0231 02/26/20 0024 02/26/20 0709 02/26/20 1152 02/26/20 1611 02/26/20 1611 02/26/20 1742 02/26/20 1742 02/27/20 0514 02/27/20 0514 02/27/20 1527 02/27/20 1527 02/28/20 0435 02/28/20 0816  NA 140   < > 141   < > 139   < > 143   < > 139  --  140  --  140  --  136 135  K 3.9   < > 3.8   < > 4.6   < > 5.4*   < > 5.1   < > 5.1   < > 5.7*   < > 4.6 4.3  CL 101   < > 101  --  102  --  98  --   --   --  100  --  102  --  94*  --   CO2 23   < > 25  --  22  --  25  --   --   --  25  --  23  --  26  --   GLUCOSE 111*   < >  133*  --  133*  --  206*  --   --   --  231*  --  264*  --  281*  --   BUN 51*   < > 57*  --  45*  --  53*  --   --   --  50*  --  63*  --  55*  --   CREATININE 7.65*   < > 8.50*  --  6.90*  --  7.86*  --   --   --  7.18*  --  8.16*  --  6.38*  --   CALCIUM 8.3*   < > 8.3*  --  8.7*  --  8.8*  --   --   --  8.3*  --  8.6*  --  8.2*  --   MG 2.1  --   --   --   --   --   --   --   --   --  2.2  --   --   --  2.3  --   PHOS 4.0   < > 5.3*  --   --   --  8.1*  --   --   --  9.0*  --  10.1*  --  8.3*  --    < > = values in this interval not displayed.   Estimated Creatinine Clearance: 12.6 mL/min (A) (by C-G formula based on SCr of 6.38 mg/dL (H)).   LIVER Recent Labs  Lab 02/26/20 0709 02/26/20 1611 02/27/20 0514 02/27/20 1527 02/28/20 0435  AST 43*  --  91*  --   --   ALT 7  --  17  --   --   ALKPHOS 448*  --  350*  --   --   BILITOT 0.8  --  0.8  --   --   PROT 5.8*  --  5.4*  --   --   ALBUMIN 2.2* 2.1* 1.9*  1.9* 1.8* 1.8*  INR 1.3*  --   --   --   --      INFECTIOUS Recent Labs  Lab 02/24/20 2219 02/25/20 2831 02/25/20 0947 02/26/20 0709 02/27/20 0514  LATICACIDVEN  --    < > 1.2 1.7 1.6  PROCALCITON 2.82  --   --   --   --    < > = values in this interval not displayed.     ENDOCRINE CBG (last 3)   Recent Labs    02/27/20 2341 02/28/20 0333 02/28/20 0755  GLUCAP 180* 199* 261*       IMAGING x48h  - image(s) personally visualized  -   highlighted in bold IR Fluoro Guide CV Line Left  Result Date: 02/28/2020 INDICATION: 49 year old with end-stage renal disease and COVID pneumonia. Left jugular dialysis catheter is no longer functioning. Recent chest radiograph demonstrates that the catheter has pulled back into the upper SVC region. EXAM: EXCHANGE OF TUNNELED DIALYSIS CATHETER WITH FLUOROSCOPY Physician: Stephan Minister. Anselm Pancoast, MD MEDICATIONS: None ANESTHESIA/SEDATION: Patient was sedated by the ICU team prior and during the procedure. FLUOROSCOPY TIME:  Fluoroscopy Time: 54 seconds, 9 mGy COMPLICATIONS: None immediate. PROCEDURE: Informed consent was obtained for exchange of the tunneled dialysis catheter. The patient was placed supine on the interventional table. The existing left chest catheter was prepped and draped in a sterile fashion. Maximal barrier sterile technique was utilized including caps, mask, sterile gowns, sterile gloves, sterile drape, hand hygiene and skin antiseptic. Fluoroscopic image  demonstrated that the catheter had been pulled back into the upper SVC region. Retention sutures removed. Catheter cuff was easily exposed with traction. The catheter was removed over a stiff Glidewire. Glidewire was advanced into the inferior vena cava. A new 28 cm tip to cuff Palindrome catheter was advanced over the wire. New dialysis catheter was positioned in the right atrium. Both lumens aspirated and flushed well. Appropriate amount of heparin was placed in both lumens. Catheter was sutured to skin. Dressing was placed. Fluoroscopic images were taken and saved for this procedure. FINDINGS: New catheter tip is in the right atrium. IMPRESSION: Successful exchange of the left jugular tunneled dialysis catheter with fluoroscopy. Electronically Signed   By: Markus Daft M.D.   On: 02/28/2020 08:59   DG  CHEST PORT 1 VIEW  Result Date: 02/27/2020 CLINICAL DATA:  Intubation, COVID-19 EXAM: PORTABLE CHEST 1 VIEW COMPARISON:  Portable exam 1314 hours compared to 02/26/2020 FINDINGS: Tip of endotracheal tube projects approximately 3.6 cm above carina. Nasogastric tube extends into stomach. BILATERAL jugular central venous catheters with tips projecting over SVC. Normal heart size and mediastinal contours. Asymmetric pulmonary infiltrates RIGHT greater than LEFT unchanged. No gross pleural effusion or pneumothorax. IMPRESSION: Persistent BILATERAL pulmonary infiltrates consistent with multifocal pneumonia and COVID-19, not significantly changed. Electronically Signed   By: Lavonia Dana M.D.   On: 02/27/2020 13:30   DG Chest Port 1 View  Result Date: 02/26/2020 CLINICAL DATA:  Endotracheal tube placement EXAM: PORTABLE CHEST 1 VIEW COMPARISON:  02/26/2020 FINDINGS: Endotracheal tube tip measures about 1.9 cm above the carina. Enteric tube tip is off the field of view but below the left hemidiaphragm. A right central venous catheter is present with tip overlying the upper SVC region. Left central venous catheter overlies the mid SVC region near the brachiocephalic origin diffuse bilateral airspace parenchymal infiltrates in the lungs compatible with multifocal pneumonia or possibly ARDS. No pleural effusions. No pneumothorax. Heart shadow is obscured. IMPRESSION: Appliances appear in satisfactory position. Persistent diffuse bilateral airspace disease. Electronically Signed   By: Lucienne Capers M.D.   On: 02/26/2020 23:43   DG Abd Portable 1V  Result Date: 02/27/2020 CLINICAL DATA:  Feeding tube placement EXAM: PORTABLE ABDOMEN - 1 VIEW COMPARISON:  Portable exam 1316 hours without priors for comparison FINDINGS: Tip of nasogastric tube projects over duodenal bulb. Visualized bowel gas pattern normal. Persistent pulmonary infiltrates consistent with multifocal pneumonia due to COVID-19. Linear lucency LEFT  paraspinal at B84-Y6, uncertain etiology, may represent residual aeration at medial LEFT lung base versus inferior LEFT pneumomediastinum Osseous structures unremarkable. IMPRESSION: Persistent pulmonary infiltrates consistent with multifocal pneumonia and COVID-19. Tip of nasogastric tube projects over duodenal bulb. LEFT paraspinal lucency at T11-L1 question area at medial LEFT lung base versus inferior LEFT pneumomediastinum, favor pneumomediastinum; consider CT assessment. Findings called to Dr. Lynetta Mare on 02/27/2020 at 1338 hours. Electronically Signed   By: Lavonia Dana M.D.   On: 02/27/2020 13:38   CRITICAL CARE Performed by: Spero Geralds   The patient is critically ill with multiple organ systems failure and requires high complexity decision making for assessment and support, frequent evaluation and titration of therapies, application of advanced monitoring technologies and extensive interpretation of multiple databases.   Critical Care Time devoted to patient care services described in this note is 33 minutes. This time reflects time of care of this [place] . This critical care time does not reflect separately billable procedures or procedure time, teaching time or supervisory time  of PA/NP/Med student/Med Resident etc but could involve care discussion time.  [facility] 02/28/2020 11:21 AM  Pager: [phone] After hours pager: [phone]

## 2020-02-28 NOTE — Progress Notes (Signed)
I have updated Wanda Ramirez  Patient's daughter and also she gave consent for Blood transfusion

## 2020-02-28 NOTE — Progress Notes (Addendum)
[facility] Progress Note  Subjective:   Seen and examined on CRRT.  Went to IR on 9/27 for tunneled dialysis catheter exchange as she wasn't running well on CRRT.  She has had 1.1 liter UF with CRRT over 9/27 reporting period as charted. CRRT going ok.  fluid removal rate ordered as keep even and she has been tolerating.  Still on levo - down to 20 mcg/min and on vaso.  FiO2 down to 55%.  Was started on heparin for clotting per nephro o/n  Review of systems;  Unable to obtain 2/2 intubated, sedated     Vitals:   02/28/20 0500 02/28/20 0515 02/28/20 0530 02/28/20 0545  BP:      Pulse: 91 (!) 193  91  Resp: 19 (!) 30 (!) 30 (!) 30  Temp:      TempSrc:      SpO2: 99% (!) 86%  96%  Weight: 111.9 kg     Height:        Exam: gen adult female critically ill   Chest coarse breath sounds bilat   tachy S1S2 no rub   Abd soft obese habitus on sedation cannot assess tenderness   Ext no LE edema    LUA AVF per charting; left IJ tunneled dialysis catheter Neuro - on Sedation   OP HD: TTS  4h 66mn 450/800  2/2.25 bath  111.5kg  Hep 2000  L AVF  - darbe 50 ug q week, last 9/7  - calc 1.0 tiw  - 9/9 Hb 10.0, tsat 22%  Assessment/ Plan: # COVID pna w/ acute on chronic hypoxic respiratory failure:  dexamethasone, status post tocilizumab and remdesivir.  Severe CXR changes.    # Acute hypoxic resp failure  - mechanical ventilation per primary team   # ESRD: TTS HD.  Continue CRRT.  2K for dialysate and was ordered for bicarb pre/post.  UF goal keep even to net neg 50 as tolerated for now.  Follow PM CRRT labs  # Clotted LUA AVF: note TDC placed by IR 9/11. Will need new perm access at some point when pt is more stable.  Tunneled catheter exchanged on 9/27  # Volume/ hypotension: hypotension; per charting wt's are several kg below dry with attempts to optimize resp status    # Anemia of Chronic Kidney Disease:aranesp 100 mcg every tues - will increase to 150 mcg for 9/28  and onward for now. Would transfuse if Hb drops  # Secondary Hyperparathyroidism/Hyperphosphatemia:  as plan for CRRT discontinue Auryxia and temporarily discontinued Sensipar 60 mg every other day  #DM2 - management per primary team   Recent Labs  Lab 02/26/20 1742 02/27/20 0514 02/27/20 0533 02/27/20 1527 02/28/20 0435  K 5.1   < >  --  5.7* 4.6  BUN  --    < >  --  63* 55*  CREATININE  --    < >  --  8.16* 6.38*  CALCIUM  --    < >  --  8.6* 8.2*  PHOS  --    < >  --  10.1* 8.3*  HGB 8.5*  --  7.3*  --   --    < > = values in this interval not displayed.   Inpatient medications: . artificial tears  1 application Both Eyes [id number] . B-complex with vitamin C  1 tablet Per Tube Daily  . chlorhexidine      . chlorhexidine gluconate (MEDLINE KIT)  15 mL Mouth Rinse  BID  . Chlorhexidine Gluconate Cloth  6 each Topical Q0600  . darbepoetin (ARANESP) injection - DIALYSIS  100 mcg Intravenous Q Tue-HD  . docusate  100 mg Per Tube BID  . feeding supplement (PROSource TF)  45 mL Per Tube QID  . heparin injection (subcutaneous)  5,000 Units Subcutaneous Q8H  . heparin sodium (porcine)      . insulin aspart  0-20 Units Subcutaneous Q4H  . insulin glargine  25 Units Subcutaneous BID  . levothyroxine  125 mcg Per Tube Q0600  . lidocaine      . mouth rinse  15 mL Mouth Rinse BID  . mouth rinse  15 mL Mouth Rinse 10 times per day  . [START ON 02/29/2020] pantoprazole  40 mg Intravenous Q12H  . polyethylene glycol  17 g Per Tube Daily  . sodium chloride flush  10-40 mL Intracatheter Q12H   . ceFEPime (MAXIPIME) IV    . cisatracurium (NIMBEX) infusion 2 mcg/kg/min (02/27/20 2300)  . dextrose 5 % and 0.9% NaCl 20 mL/hr at 02/27/20 2000  . feeding supplement (VITAL 1.5 CAL) 1,000 mL (02/27/20 2301)  . fentaNYL infusion INTRAVENOUS 300 mcg/hr (02/28/20 0547)  . heparin 10,000 units/ 20 mL infusion syringe 250 Units/hr (02/28/20 0506)  . midazolam 10 mg/hr (02/28/20 0014)  . norepinephrine  (LEVOPHED) Adult infusion 20 mcg/min (02/28/20 0400)  . pantoprozole (PROTONIX) infusion 8 mg/hr (02/28/20 0548)  . phenylephrine (NEO-SYNEPHRINE) Adult infusion Stopped (02/27/20 0148)  . prismasol BGK 2/2.5 dialysis solution 2,000 mL/hr at 02/28/20 0040  . sodium bicarbonate (isotonic) 1000 mL infusion 200 mL/hr at 02/27/20 2256  . sodium bicarbonate (isotonic) 1000 mL infusion 200 mL/hr at 02/27/20 2309  . vasopressin 0.04 Units/min (02/28/20 0118)   acetaminophen (TYLENOL) oral liquid 160 mg/5 mL, albuterol, alteplase, fentaNYL, fentaNYL (SUBLIMAZE) injection, fentaNYL (SUBLIMAZE) injection, heparin, heparin, heparin sodium (porcine), heparin, influenza vac split quadrivalent PF, lip balm, midazolam, midazolam, midazolam, sodium chloride flush    Claudia Desanctis, MD 02/28/2020 5:48 AM

## 2020-02-29 ENCOUNTER — Inpatient Hospital Stay (HOSPITAL_COMMUNITY): Payer: Medicaid Other

## 2020-02-29 DIAGNOSIS — E1169 Type 2 diabetes mellitus with other specified complication: Secondary | ICD-10-CM

## 2020-02-29 DIAGNOSIS — R578 Other shock: Secondary | ICD-10-CM

## 2020-02-29 DIAGNOSIS — E669 Obesity, unspecified: Secondary | ICD-10-CM

## 2020-02-29 LAB — GLUCOSE, CAPILLARY
Glucose-Capillary: 228 mg/dL — ABNORMAL HIGH (ref 70–99)
Glucose-Capillary: 299 mg/dL — ABNORMAL HIGH (ref 70–99)
Glucose-Capillary: 314 mg/dL — ABNORMAL HIGH (ref 70–99)
Glucose-Capillary: 360 mg/dL — ABNORMAL HIGH (ref 70–99)
Glucose-Capillary: 365 mg/dL — ABNORMAL HIGH (ref 70–99)
Glucose-Capillary: 374 mg/dL — ABNORMAL HIGH (ref 70–99)

## 2020-02-29 LAB — CBC
HCT: 26.8 % — ABNORMAL LOW (ref 36.0–46.0)
Hemoglobin: 8.8 g/dL — ABNORMAL LOW (ref 12.0–15.0)
MCH: 29.1 pg (ref 26.0–34.0)
MCHC: 32.8 g/dL (ref 30.0–36.0)
MCV: 88.7 fL (ref 80.0–100.0)
Platelets: 75 10*3/uL — ABNORMAL LOW (ref 150–400)
RBC: 3.02 MIL/uL — ABNORMAL LOW (ref 3.87–5.11)
RDW: 19.8 % — ABNORMAL HIGH (ref 11.5–15.5)
WBC: 26.9 10*3/uL — ABNORMAL HIGH (ref 4.0–10.5)
nRBC: 7.1 % — ABNORMAL HIGH (ref 0.0–0.2)

## 2020-02-29 LAB — POCT ACTIVATED CLOTTING TIME
Activated Clotting Time: 197 seconds
Activated Clotting Time: 208 seconds
Activated Clotting Time: 219 seconds
Activated Clotting Time: 219 seconds
Activated Clotting Time: 219 seconds
Activated Clotting Time: 224 seconds
Activated Clotting Time: 224 seconds
Activated Clotting Time: 224 seconds
Activated Clotting Time: 230 seconds
Activated Clotting Time: 230 seconds

## 2020-02-29 LAB — RENAL FUNCTION PANEL
Albumin: 1.9 g/dL — ABNORMAL LOW (ref 3.5–5.0)
Albumin: 1.9 g/dL — ABNORMAL LOW (ref 3.5–5.0)
Anion gap: 14 (ref 5–15)
Anion gap: 11 (ref 5–15)
BUN: 35 mg/dL — ABNORMAL HIGH (ref 6–20)
BUN: 32 mg/dL — ABNORMAL HIGH (ref 6–20)
CO2: 24 mmol/L (ref 22–32)
CO2: 24 mmol/L (ref 22–32)
Calcium: 8.5 mg/dL — ABNORMAL LOW (ref 8.9–10.3)
Calcium: 8.6 mg/dL — ABNORMAL LOW (ref 8.9–10.3)
Chloride: 98 mmol/L (ref 98–111)
Chloride: 100 mmol/L (ref 98–111)
Creatinine, Ser: 3.17 mg/dL — ABNORMAL HIGH (ref 0.44–1.00)
Creatinine, Ser: 2.38 mg/dL — ABNORMAL HIGH (ref 0.44–1.00)
GFR calc Af Amer: 19 mL/min — ABNORMAL LOW (ref >60)
GFR calc Af Amer: 27 mL/min — ABNORMAL LOW (ref >60)
GFR calc non Af Amer: 16 mL/min — ABNORMAL LOW (ref >60)
GFR calc non Af Amer: 23 mL/min — ABNORMAL LOW (ref >60)
Glucose, Bld: 387 mg/dL — ABNORMAL HIGH (ref 70–99)
Glucose, Bld: 382 mg/dL — ABNORMAL HIGH (ref 70–99)
Phosphorus: 5 mg/dL — ABNORMAL HIGH (ref 2.5–4.6)
Phosphorus: 4.5 mg/dL (ref 2.5–4.6)
Potassium: 4.8 mmol/L (ref 3.5–5.1)
Potassium: 5.1 mmol/L (ref 3.5–5.1)
Sodium: 136 mmol/L (ref 135–145)
Sodium: 135 mmol/L (ref 135–145)

## 2020-02-29 LAB — ECHOCARDIOGRAM LIMITED
Area-P 1/2: 4.83 cm2
Calc EF: 52.8 %
Height: 62 in
S' Lateral: 2.6 cm
Single Plane A2C EF: 45 %
Single Plane A4C EF: 58.1 %
Weight: 4024.72 oz

## 2020-02-29 LAB — POCT I-STAT 7, (LYTES, BLD GAS, ICA,H+H)
Acid-base deficit: 1 mmol/L (ref 0.0–2.0)
Bicarbonate: 26 mmol/L (ref 20.0–28.0)
Calcium, Ion: 1.2 mmol/L (ref 1.15–1.40)
HCT: 27 % — ABNORMAL LOW (ref 36.0–46.0)
Hemoglobin: 9.2 g/dL — ABNORMAL LOW (ref 12.0–15.0)
O2 Saturation: 93 %
Patient temperature: 98.3
Potassium: 5.5 mmol/L — ABNORMAL HIGH (ref 3.5–5.1)
Sodium: 135 mmol/L (ref 135–145)
TCO2: 28 mmol/L (ref 22–32)
pCO2 arterial: 55.9 mmHg — ABNORMAL HIGH (ref 32.0–48.0)
pH, Arterial: 7.276 — ABNORMAL LOW (ref 7.350–7.450)
pO2, Arterial: 76 mmHg — ABNORMAL LOW (ref 83.0–108.0)

## 2020-02-29 LAB — APTT: aPTT: 130 seconds — ABNORMAL HIGH (ref 24–36)

## 2020-02-29 LAB — MAGNESIUM: Magnesium: 2.9 mg/dL — ABNORMAL HIGH (ref 1.7–2.4)

## 2020-02-29 MED ORDER — PRISMASOL BGK 0/2.5 32-2.5 MEQ/L IV SOLN
INTRAVENOUS | Status: DC
  Filled 2020-02-29 (×26): qty 5000

## 2020-02-29 MED ORDER — INSULIN ASPART 100 UNIT/ML ~~LOC~~ SOLN: 8.0000 [IU] | SUBCUTANEOUS | Status: DC

## 2020-02-29 MED ORDER — LACTULOSE 10 GM/15ML PO SOLN
10.0000 g | Freq: Three times a day (TID) | ORAL | Status: DC
  Filled 2020-02-29: qty 15

## 2020-02-29 MED ORDER — LACTULOSE 10 GM/15ML PO SOLN
10.0000 g | Freq: Three times a day (TID) | ORAL | Status: DC
  Administered 2020-02-29 – 2020-03-01 (×4): 10 g
  Filled 2020-02-29 (×3): qty 15

## 2020-02-29 MED ORDER — CHLORHEXIDINE GLUCONATE CLOTH 2 % EX PADS
6.0000 | MEDICATED_PAD | Freq: Every day | CUTANEOUS | Status: DC
  Administered 2020-03-01 – 2020-03-14 (×13): 6 via TOPICAL

## 2020-02-29 MED ORDER — INSULIN ASPART 100 UNIT/ML ~~LOC~~ SOLN
8.0000 [IU] | SUBCUTANEOUS | Status: DC
  Administered 2020-02-29 – 2020-03-01 (×10): 8 [IU] via SUBCUTANEOUS

## 2020-02-29 NOTE — Progress Notes (Signed)
Assisted tele visit to patient with family member.  Daimion Adamcik Anderson, RN   

## 2020-02-29 NOTE — Progress Notes (Addendum)
NAME:  Wanda Ramirez, MRN:  [id number], DOB:  [birth year], LOS: 95 ADMISSION DATE:  02/13/2020, CONSULTATION DATE: 02/24/2020 REFERRING MD: Dr. Heber French [place], CHIEF COMPLAINT: Increasing supplemental oxygen demand  Brief History   Wanda Ramirez is a 49 year old female with a past medical history significant for end-stage renal disease on hemodialysis MWF, chronic hypoxic respiratory failure on 3 L nasal cannula at baseline, sleep apnea, diabetes, hypertension, hyperlipidemia, hypothyroidism, and asthma who presented to the emergency department with complaints of worsening shortness of breath, dry cough, generalized body aches, and loose stools.  Patient was diagnosed 9/9 with Covid pneumonia.  Initially she was able to maintain outpatient status but when symptoms worsened 9/14 she presented to the emergency department.  Patient reported dyspnea and weakness worsened to the point where she was unable to ambulate effectively.  She also reports she was unable to maintain adequate oral intake.  Patient received the moderna vaccines in March and April of this year.  Patient presented initially febrile with a temperature of 102.9 and mildly tachypneic with all other vital signs within normal limits.  She was initially managed on 4 L nasal cannula.  Lab work significant for NA 130, K3.8, glucose 454, anion gap 21, lactic acid 1.8, LDH 296, ferritin 6690, CRP 13.3, and procalcitonin 10.51  On review of medical record it appears patient has progressively required increased supplemental oxygen requirement.  Afternoon of 9/24 patient had episode of desaturation requiring further increase in supplemental oxygen.  On chart assessment it appears patient has been placed on 40 L with reported increased work of breathing prompting PCCM consultation  Past Medical History  Sleep apnea Morbid obesity Hypothyroidism Hypertension GERD  End-stage renal disease on HD MWF Diabetes Asthma   Immunization History   Administered Date(s) Administered  . Influenza,inj,Quad PF,6+ Mos 02/09/2018     Significant Hospital Events   Admitted 9/14  9/24 - Lying in bed on side currently undergoing iHD,  She is lethargic but will arouse to verbal stimuli but quickly falls back to sleep.  RN states patient has had progressive decline over the last 12hrs  9/25 - -currently in medical ICU to [facility].  Bed 12.  She has been on BiPAP with Precedex overnight.  This morning on the low-dose of Precedex she got agitated.?  Also had melena.  She also desaturated.  Nursing then increase her Precedex and since then she has been more stable.  Yesterday dialysis was cut short.   9/26 -unable to run CRRT with prone ventilation.  Resume supine ventilation but still unable to draw blood back.  9/27 transferred to 17m. IR replaced HD catheter  Consults:  PCCM  Procedures:  9/23 IR guided tunneled HD cath placement - left subclavian  Significant Diagnostic Tests:  Pulmonary VQ scan at 9/21 > negative  Micro Data:  COVID 9/14 >  Blood cultures 9/13 > negative  Antimicrobials:  Cefepime 9/24 > Azithromycin 9/21 > 9/26 Dexamethasone Remdesivir completed 9/17 > 9/19 Tocilizumab x1  Interim history/subjective:  Tolerating CRRT. Febrile overnight  Objective   Blood pressure 124/69, pulse (!) 102, temperature 98.9 F (37.2 C), temperature source Oral, resp. rate (!) 30, height 5\' 2"  (1.575 m), weight 114.1 kg, SpO2 94 %.    Vent Mode: PRVC FiO2 (%):  [55 %-70 %] 60 % Set Rate:  [30 bmp] 30 bmp Vt Set:  [400 mL] 400 mL PEEP:  [16 cmH20] 16 cmH20 Plateau Pressure:  [35 cmH20-38 cmH20] 35 cmH20   Intake/Output Summary (Last 24 hours)  at 02/29/2020 1034 Last data filed at 02/29/2020 1000 Gross per 24 hour  Intake 5063.17 ml  Output 4484 ml  Net 579.17 ml   Filed Weights   02/27/20 0313 02/28/20 0500 02/29/20 0500  Weight: 105.9 kg 111.9 kg 114.1 kg  General Appearance:  Critically ill, obese Throat: ETT to  vent Lungs: sounds of mechanical ventilation auscultated Heart: tachycardic, regular Abdomen:  Obese Soft, no masses, no organomegaly Neurologic: Sedated no response to stimulation.   Resolved Hospital Problem list     Assessment & Plan:   Critically ill due to acute Hypoxic / Hypercapnic Respiratory Failure requiring mechanical ventilation. Severe ARDS secondary to COVID 19 Pneumonia - unable to tolerate prone ventilation due to hemodynamic instability.  - continue lung protective ventilation - continue NMB. Will monitor response with serial ABGs -Status post steroid, remdesivir, immunomodulating therapy.  ESRD on HD  -on CRRT - Nephrology following. Unable to remove fluid currently and concern is for some hypovolemia. However avoiding volume overload due to ARDS.  Acute on chronic Anemia - likely component of chronic disease with blood loss - no obvious signs of bleeding - s/p 2 u prbc on 9/28  Circulatory Shock Unclear source. Treated with 10 days of broad spectrum abx. Will stop cefepime today as no benefit. Weaning vasopressors for goal MAP>65  Best practice:  Diet:NPO Pain/Anxiety/Delirium protocol (if indicated): PRN VAP protocol (if indicated): N/A DVT prophylaxis: Subq heparin GI prophylaxis: PPI Glucose control: SSI Mobility: Bedrest Code Status: Full Family Communication: updated daughter Barnie Alderman by phone on 9/29 at 11:45am Disposition: continue ICU care  The patient is critically ill with multiple organ systems failure and requires high complexity decision making for assessment and support, frequent evaluation and titration of therapies, application of advanced monitoring technologies and extensive interpretation of multiple databases.   Critical Care Time devoted to patient care services described in this note is 35 minutes. This time reflects time of care of this [place] . This critical care time does not reflect separately billable procedures  or procedure time, teaching time or supervisory time of PA/NP/Med student/Med Resident etc but could involve care discussion time.  Spero Geralds [place] [facility] 02/29/2020 10:34 AM  Pager: [phone] After hours pager: [phone]    LABS    PULMONARY Recent Labs  Lab 02/26/20 0024 02/26/20 0543 02/26/20 1152 02/26/20 1742 02/28/20 0816  PHART 7.321* 7.342* 7.220* 7.193* 7.437  PCO2ART 47.8 44.9 69.1* 68.0* 47.2  PO2ART 47* 57* 75* 78* 49*  HCO3 24.8 24.4 27.9 25.9 32.7*  TCO2 26 26 30 28  34*  O2SAT 80.0 88.0 90.0 90.0 89.0    CBC Recent Labs  Lab 02/27/20 0533 02/27/20 0533 02/28/20 0726 02/28/20 0726 02/28/20 0816 02/28/20 1757 02/29/20 0430  HGB 7.3*   < > 5.6*   < > 6.1* 8.9* 8.8*  HCT 23.2*   < > 18.1*   < > 18.0* 26.2* 26.8*  WBC 32.3*  --  26.3*  --   --   --  26.9*  PLT 114*  --  62*  --   --   --  75*   < > = values in this interval not displayed.    COAGULATION Recent Labs  Lab 02/26/20 0709  INR 1.3*    CARDIAC  No results for input(s): TROPONINI in the last 168 hours. No results for input(s): PROBNP in the last 168 hours.   CHEMISTRY Recent Labs  Lab 02/24/20 1916 02/24/20 2013 02/27/20  9622 02/27/20 0514 02/27/20 1527 02/27/20 1527 02/28/20 0435 02/28/20 0435 02/28/20 0816 02/28/20 0816 02/28/20 1539 02/29/20 0430  NA 140   < > 140   < > 140  --  136  --  135  --  138 136  K 3.9   < > 5.1   < > 5.7*   < > 4.6   < > 4.3   < > 4.1 4.8  CL 101   < > 100  --  102  --  94*  --   --   --  92* 98  CO2 23   < > 25  --  23  --  26  --   --   --  28 24  GLUCOSE 111*   < > 231*  --  264*  --  281*  --   --   --  240* 387*  BUN 51*   < > 50*  --  63*  --  55*  --   --   --  41* 35*  CREATININE 7.65*   < > 7.18*  --  8.16*  --  6.38*  --   --   --  4.32* 3.17*  CALCIUM 8.3*   < > 8.3*  --  8.6*  --  8.2*  --   --   --  7.8* 8.5*  MG 2.1  --  2.2  --   --   --  2.3  --   --   --   --  2.9*  PHOS 4.0   <  > 9.0*  --  10.1*  --  8.3*  --   --   --  7.3* 5.0*   < > = values in this interval not displayed.   Estimated Creatinine Clearance: 25.7 mL/min (A) (by C-G formula based on SCr of 3.17 mg/dL (H)).   LIVER Recent Labs  Lab 02/26/20 0709 02/26/20 1611 02/27/20 0514 02/27/20 1527 02/28/20 0435 02/28/20 1539 02/29/20 0430  AST 43*  --  91*  --   --   --   --   ALT 7  --  17  --   --   --   --   ALKPHOS 448*  --  350*  --   --   --   --   BILITOT 0.8  --  0.8  --   --   --   --   PROT 5.8*  --  5.4*  --   --   --   --   ALBUMIN 2.2*   < > 1.9*  1.9* 1.8* 1.8* 1.9* 1.9*  INR 1.3*  --   --   --   --   --   --    < > = values in this interval not displayed.     INFECTIOUS Recent Labs  Lab 02/24/20 2219 02/25/20 2979 02/25/20 0947 02/26/20 0709 02/27/20 0514  LATICACIDVEN  --    < > 1.2 1.7 1.6  PROCALCITON 2.82  --   --   --   --    < > = values in this interval not displayed.     ENDOCRINE CBG (last 3)  Recent Labs    02/28/20 2335 02/29/20 0339 02/29/20 0742  GLUCAP 282* 314* 374*       IMAGING x48h  - image(s) personally visualized  -   highlighted in bold IR Fluoro Guide CV Line Left  Result Date: 02/28/2020 INDICATION: 49 year old with  end-stage renal disease and COVID pneumonia. Left jugular dialysis catheter is no longer functioning. Recent chest radiograph demonstrates that the catheter has pulled back into the upper SVC region. EXAM: EXCHANGE OF TUNNELED DIALYSIS CATHETER WITH FLUOROSCOPY Physician: Stephan Minister. Anselm Pancoast, MD MEDICATIONS: None ANESTHESIA/SEDATION: Patient was sedated by the ICU team prior and during the procedure. FLUOROSCOPY TIME:  Fluoroscopy Time: 54 seconds, 9 mGy COMPLICATIONS: None immediate. PROCEDURE: Informed consent was obtained for exchange of the tunneled dialysis catheter. The patient was placed supine on the interventional table. The existing left chest catheter was prepped and draped in a sterile fashion. Maximal barrier sterile  technique was utilized including caps, mask, sterile gowns, sterile gloves, sterile drape, hand hygiene and skin antiseptic. Fluoroscopic image demonstrated that the catheter had been pulled back into the upper SVC region. Retention sutures removed. Catheter cuff was easily exposed with traction. The catheter was removed over a stiff Glidewire. Glidewire was advanced into the inferior vena cava. A new 28 cm tip to cuff Palindrome catheter was advanced over the wire. New dialysis catheter was positioned in the right atrium. Both lumens aspirated and flushed well. Appropriate amount of heparin was placed in both lumens. Catheter was sutured to skin. Dressing was placed. Fluoroscopic images were taken and saved for this procedure. FINDINGS: New catheter tip is in the right atrium. IMPRESSION: Successful exchange of the left jugular tunneled dialysis catheter with fluoroscopy. Electronically Signed   By: Markus Daft M.D.   On: 02/28/2020 08:59   DG CHEST PORT 1 VIEW  Result Date: 02/27/2020 CLINICAL DATA:  Intubation, COVID-19 EXAM: PORTABLE CHEST 1 VIEW COMPARISON:  Portable exam 1314 hours compared to 02/26/2020 FINDINGS: Tip of endotracheal tube projects approximately 3.6 cm above carina. Nasogastric tube extends into stomach. BILATERAL jugular central venous catheters with tips projecting over SVC. Normal heart size and mediastinal contours. Asymmetric pulmonary infiltrates RIGHT greater than LEFT unchanged. No gross pleural effusion or pneumothorax. IMPRESSION: Persistent BILATERAL pulmonary infiltrates consistent with multifocal pneumonia and COVID-19, not significantly changed. Electronically Signed   By: Lavonia Dana M.D.   On: 02/27/2020 13:30   DG Abd Portable 1V  Result Date: 02/27/2020 CLINICAL DATA:  Feeding tube placement EXAM: PORTABLE ABDOMEN - 1 VIEW COMPARISON:  Portable exam 1316 hours without priors for comparison FINDINGS: Tip of nasogastric tube projects over duodenal bulb. Visualized bowel  gas pattern normal. Persistent pulmonary infiltrates consistent with multifocal pneumonia due to COVID-19. Linear lucency LEFT paraspinal at B84-Y6, uncertain etiology, may represent residual aeration at medial LEFT lung base versus inferior LEFT pneumomediastinum Osseous structures unremarkable. IMPRESSION: Persistent pulmonary infiltrates consistent with multifocal pneumonia and COVID-19. Tip of nasogastric tube projects over duodenal bulb. LEFT paraspinal lucency at T11-L1 question area at medial LEFT lung base versus inferior LEFT pneumomediastinum, favor pneumomediastinum; consider CT assessment. Findings called to Dr. Lynetta Mare on 02/27/2020 at 1338 hours. Electronically Signed   By: Lavonia Dana M.D.   On: 02/27/2020 13:38

## 2020-02-29 NOTE — Progress Notes (Signed)
Restarted Neosynephrine this shift and keeping CRRT even per Dr. Royce Macadamia.

## 2020-02-29 NOTE — Progress Notes (Signed)
PHARMACY NOTE:  ANTIMICROBIAL RENAL DOSAGE ADJUSTMENT  Current antimicrobial regimen includes a mismatch between antimicrobial dosage and estimated renal function.  As per policy approved by the Pharmacy & Therapeutics and Medical Executive Committees, the antimicrobial dosage will be adjusted accordingly.  Current antimicrobial dosage:  Cefepime 1G Q24H  Indication: HCAP  Renal Function:  Estimated Creatinine Clearance: 25.7 mL/min (A) (by C-G formula based on SCr of 3.17 mg/dL (H)). []      On intermittent HD, scheduled: [x]      On CRRT    Antimicrobial dosage has been changed to:  Cefepime 1G Q12H  Additional comments: Patient was reduced to cefepime 1G Q24H initially due to not tolerating CRRT. IR placed a new dialysis catheter port and patient has been tolerating CRRT. Decision was made to increase dosing interval to Q12H. Follow up LOT.    Thank you for allowing pharmacy to be a part of this patient's care.  Cephus Slater, PharmD, MBA Pharmacy Resident [phone] 02/29/2020 7:50 AM

## 2020-02-29 NOTE — Progress Notes (Signed)
[facility] Progress Note  Subjective:    Seen and examined on CRRT.   She has had 4.1 liter UF with CRRT over 9/28. She has been on levo at 40, neo at 20, and vaso.  Has been keep even to net neg 50 an hour   Review of systems;  Unable to obtain 2/2 intubated, sedated     Vitals:   02/29/20 0445 02/29/20 0500 02/29/20 0515 02/29/20 0530  BP:      Pulse:      Resp: (!) 30 (!) 30 (!) 29 (!) 30  Temp:      TempSrc:      SpO2:      Weight:  114.1 kg    Height:  _0  (1.575 m)      Exam: gen adult female critically ill   Chest coarse breath sounds bilat   tachy S1S2 no rub   Abd soft obese habitus on sedation cannot assess tenderness   Ext no LE edema    LUA AVF per charting; left IJ tunneled dialysis catheter Neuro - on Sedation and paralytics  OP HD: TTS  4h 33mn 450/800  2/2.25 bath  111.5kg  Hep 2000  L AVF  - darbe 50 ug q week, last 9/7  - calc 1.0 tiw  - 9/9 Hb 10.0, tsat 22%  Assessment/ Plan: # COVID pna w/ acute on chronic hypoxic respiratory failure:  dexamethasone, status post tocilizumab and remdesivir.  Severe CXR changes.    # Acute hypoxic resp failure  - mechanical ventilation per primary team   # ESRD: TTS HD.  Continue CRRT.  All standard 4K fluids for now.  UF goal keep even as tolerated.  Follow PM CRRT labs  # Clotted LUA AVF: note TDC placed by IR 9/11. Will need new perm access at some point when pt is more stable.  Tunneled catheter exchanged on 9/27 with IR  # Septic shock - on pressors per pulm  # Volume status:  hypotension; per charting wt's are several kg below dry with attempts to optimize resp status    # Anemia of Chronic Kidney Disease: aranesp increased to 150 mcg for 9/28 and onward for now  # Secondary Hyperparathyroidism/Hyperphosphatemia:  while on CRRT discontinued Auryxia and temporarily discontinued Sensipar 60 mg every other day  #DM2 - management per primary team   Recent Labs  Lab 02/28/20 0816  02/28/20 1539 02/28/20 1757 02/29/20 0430  K  --  4.1  --  4.8  BUN  --  41*  --  35*  CREATININE  --  4.32*  --  3.17*  CALCIUM  --  7.8*  --  8.5*  PHOS  --  7.3*  --  5.0*  HGB   < >  --  8.9* 8.8*   < > = values in this interval not displayed.   Inpatient medications: . artificial tears  1 application Both Eyes QO2V . B-complex with vitamin C  1 tablet Per Tube Daily  . chlorhexidine gluconate (MEDLINE KIT)  15 mL Mouth Rinse BID  . Chlorhexidine Gluconate Cloth  6 each Topical Q0600  . darbepoetin (ARANESP) injection - DIALYSIS  150 mcg Intravenous Q Tue-HD  . docusate  100 mg Per Tube BID  . feeding supplement (PROSource TF)  45 mL Per Tube QID  . heparin injection (subcutaneous)  5,000 Units Subcutaneous Q8H  . insulin aspart  0-20 Units Subcutaneous Q4H  . insulin aspart  4 Units Subcutaneous Q4H  .  insulin glargine  25 Units Subcutaneous BID  . levothyroxine  125 mcg Per Tube Q0600  . mouth rinse  15 mL Mouth Rinse BID  . mouth rinse  15 mL Mouth Rinse 10 times per day  . pantoprazole  40 mg Intravenous Q12H  . polyethylene glycol  17 g Per Tube Daily  . sodium chloride flush  10-40 mL Intracatheter Q12H   .  prismasol BGK 4/2.5 500 mL/hr at 02/29/20 0349  .  prismasol BGK 4/2.5 300 mL/hr at 02/28/20 1755  . ceFEPime (MAXIPIME) IV 1 g (02/28/20 2230)  . cisatracurium (NIMBEX) infusion 2 mcg/kg/min (02/28/20 2046)  . dextrose 5 % and 0.9% NaCl Stopped (02/28/20 1151)  . feeding supplement (VITAL 1.5 CAL) 1,000 mL (02/29/20 0014)  . fentaNYL infusion INTRAVENOUS 300 mcg/hr (02/28/20 2358)  . heparin 10,000 units/ 20 mL infusion syringe 1,500 Units/hr (02/29/20 0222)  . midazolam 10 mg/hr (02/29/20 0248)  . norepinephrine (LEVOPHED) Adult infusion 40 mcg/min (02/29/20 0206)  . phenylephrine (NEO-SYNEPHRINE) Adult infusion 20 mcg/min (02/29/20 0247)  . prismasol BGK 4/2.5 2,000 mL/hr at 02/29/20 0354  . vasopressin 0.04 Units/min (02/29/20 0244)   acetaminophen  (TYLENOL) oral liquid 160 mg/5 mL, albuterol, alteplase, fentaNYL, fentaNYL (SUBLIMAZE) injection, fentaNYL (SUBLIMAZE) injection, heparin, heparin, heparin sodium (porcine), heparin, influenza vac split quadrivalent PF, lip balm, midazolam, midazolam, midazolam, sodium chloride flush    Claudia Desanctis, MD 02/29/2020 6:22 AM

## 2020-02-29 NOTE — Progress Notes (Signed)
Inpatient Diabetes Program Recommendations  AACE/ADA: New Consensus Statement on Inpatient Glycemic Control (2015)  Target Ranges:  Prepandial:   less than 140 mg/dL      Peak postprandial:   less than 180 mg/dL (1-2 hours)      Critically ill patients:  140 - 180 mg/dL   Lab Results  Component Value Date   GLUCAP 374 (H) 02/29/2020   HGBA1C 12.8 (H) 02/14/2020    Review of Glycemic Control Results for MARKETA, MIDKIFF (MRN [id number]) as of 02/29/2020 11:06  Ref. Range 02/28/2020 15:14 02/28/2020 19:33 02/28/2020 23:35 02/29/2020 03:39 02/29/2020 07:42  Glucose-Capillary Latest Ref Range: 70 - 99 mg/dL 241 (H) 255 (H) 282 (H) 314 (H) 374 (H)     Inpatient Diabetes Program Recommendations:     Please consider,  COVID-19 ICU Glycemic Control order set.  Would recommend IV Insulin given current CBG's of > 300 mg/dl.  Will continue to follow while inpatient.  Thank you, Reche Dixon, RN, BSN Diabetes Coordinator Inpatient Diabetes Program [phone] (team pager from 8a-5p)

## 2020-02-29 NOTE — Progress Notes (Signed)
  Echocardiogram 2D Echocardiogram has been performed.  Michiel Cowboy 02/29/2020, 11:44 AM

## 2020-03-01 DIAGNOSIS — J80 Acute respiratory distress syndrome: Secondary | ICD-10-CM

## 2020-03-01 DIAGNOSIS — Z992 Dependence on renal dialysis: Secondary | ICD-10-CM

## 2020-03-01 LAB — BPAM RBC
Blood Product Expiration Date: 202110052359
Blood Product Expiration Date: 202110052359
ISSUE DATE / TIME: 202109281127
ISSUE DATE / TIME: 202109281127
Unit Type and Rh: 5100
Unit Type and Rh: 5100

## 2020-03-01 LAB — RENAL FUNCTION PANEL
Albumin: 1.9 g/dL — ABNORMAL LOW (ref 3.5–5.0)
Albumin: 2 g/dL — ABNORMAL LOW (ref 3.5–5.0)
Anion gap: 13 (ref 5–15)
Anion gap: 11 (ref 5–15)
BUN: 29 mg/dL — ABNORMAL HIGH (ref 6–20)
BUN: 24 mg/dL — ABNORMAL HIGH (ref 6–20)
CO2: 24 mmol/L (ref 22–32)
CO2: 24 mmol/L (ref 22–32)
Calcium: 8.5 mg/dL — ABNORMAL LOW (ref 8.9–10.3)
Calcium: 8.8 mg/dL — ABNORMAL LOW (ref 8.9–10.3)
Chloride: 99 mmol/L (ref 98–111)
Chloride: 99 mmol/L (ref 98–111)
Creatinine, Ser: 1.83 mg/dL — ABNORMAL HIGH (ref 0.44–1.00)
Creatinine, Ser: 1.79 mg/dL — ABNORMAL HIGH (ref 0.44–1.00)
GFR calc Af Amer: 37 mL/min — ABNORMAL LOW (ref >60)
GFR calc Af Amer: 38 mL/min — ABNORMAL LOW (ref >60)
GFR calc non Af Amer: 32 mL/min — ABNORMAL LOW (ref >60)
GFR calc non Af Amer: 33 mL/min — ABNORMAL LOW (ref >60)
Glucose, Bld: 254 mg/dL — ABNORMAL HIGH (ref 70–99)
Glucose, Bld: 245 mg/dL — ABNORMAL HIGH (ref 70–99)
Phosphorus: 3.8 mg/dL (ref 2.5–4.6)
Phosphorus: 4.1 mg/dL (ref 2.5–4.6)
Potassium: 4.3 mmol/L (ref 3.5–5.1)
Potassium: 4.4 mmol/L (ref 3.5–5.1)
Sodium: 136 mmol/L (ref 135–145)
Sodium: 134 mmol/L — ABNORMAL LOW (ref 135–145)

## 2020-03-01 LAB — POCT I-STAT 7, (LYTES, BLD GAS, ICA,H+H)
Acid-Base Excess: 1 mmol/L (ref 0.0–2.0)
Bicarbonate: 28 mmol/L (ref 20.0–28.0)
Calcium, Ion: 1.25 mmol/L (ref 1.15–1.40)
HCT: 26 % — ABNORMAL LOW (ref 36.0–46.0)
Hemoglobin: 8.8 g/dL — ABNORMAL LOW (ref 12.0–15.0)
O2 Saturation: 86 %
Patient temperature: 97.7
Potassium: 4.4 mmol/L (ref 3.5–5.1)
Sodium: 137 mmol/L (ref 135–145)
TCO2: 30 mmol/L (ref 22–32)
pCO2 arterial: 52.9 mmHg — ABNORMAL HIGH (ref 32.0–48.0)
pH, Arterial: 7.329 — ABNORMAL LOW (ref 7.350–7.450)
pO2, Arterial: 55 mmHg — ABNORMAL LOW (ref 83.0–108.0)

## 2020-03-01 LAB — TYPE AND SCREEN
ABO/RH(D): O POS
Antibody Screen: NEGATIVE
Unit division: 0
Unit division: 0

## 2020-03-01 LAB — POCT ACTIVATED CLOTTING TIME
Activated Clotting Time: 219 seconds
Activated Clotting Time: 219 seconds

## 2020-03-01 LAB — GLUCOSE, CAPILLARY
Glucose-Capillary: 242 mg/dL — ABNORMAL HIGH (ref 70–99)
Glucose-Capillary: 177 mg/dL — ABNORMAL HIGH (ref 70–99)
Glucose-Capillary: 185 mg/dL — ABNORMAL HIGH (ref 70–99)
Glucose-Capillary: 221 mg/dL — ABNORMAL HIGH (ref 70–99)
Glucose-Capillary: 298 mg/dL — ABNORMAL HIGH (ref 70–99)

## 2020-03-01 LAB — APTT: aPTT: 177 seconds (ref 24–36)

## 2020-03-01 LAB — MAGNESIUM: Magnesium: 2.8 mg/dL — ABNORMAL HIGH (ref 1.7–2.4)

## 2020-03-01 MED ORDER — BISACODYL 10 MG RE SUPP
10.0000 mg | Freq: Once | RECTAL | Status: AC
  Administered 2020-03-01: 10 mg via RECTAL
  Filled 2020-03-01: qty 1

## 2020-03-01 NOTE — Procedures (Signed)
Arterial Catheter Insertion Procedure Note  Wanda Ramirez  [id number]  13-Oct-1970  Date:03/01/20  Time:5:31 PM    Provider Performing: Wanda Ramirez    Procedure: Insertion of Arterial Line [phone]) with US guidance (42767)   Indication(s) Blood pressure monitoring and/or need for frequent ABGs  Consent Unable to obtain consent due to emergent nature of procedure.  Anesthesia None   Time Out Verified patient identification, verified procedure, site/side was marked, verified correct patient position, special equipment/implants available, medications/allergies/relevant history reviewed, required imaging and test results available.   Sterile Technique Maximal sterile technique including full sterile barrier drape, hand hygiene, sterile gown, sterile gloves, mask, hair covering, sterile ultrasound probe cover (if used).   Procedure Description Area of catheter insertion was cleaned with chlorhexidine and draped in sterile fashion. With real-time ultrasound guidance an arterial catheter was placed into the right radial artery.  Appropriate arterial tracings confirmed on monitor.     Complications/Tolerance None; patient tolerated the procedure well.   EBL Minimal   Specimen(s) None   Wanda Ramirez, AGACNP-BC [place] for personal pager PCCM on call pager [phone]  03/01/2020 5:32 PM

## 2020-03-01 NOTE — Progress Notes (Signed)
Assisted tele visit to patient with family member.  Eriyah Fernando P, RN  

## 2020-03-01 NOTE — Progress Notes (Signed)
Assisted tele visit to patient with family member.  Jermey Closs McEachran, RN  

## 2020-03-01 NOTE — Progress Notes (Addendum)
CRITICAL VALUE ALERT  Critical Value: APTT  Date & Time Notied: 9/30 0445  Provider Notified: Dr. Joelyn Oms with Nephro  Orders Received/Actions taken: Dr. Joelyn Oms ordered to continue to monitor at the moment. He will notify Dr. Royce Macadamia about the lab value.

## 2020-03-01 NOTE — Progress Notes (Signed)
[facility] Progress Note  Subjective:    Seen and examined on CRRT.  She has had 4 liters UF with CRRT over 9/29 - we set goal of keep even as tolerated. Transitioned to 2K for dialysate yesterday afternoon.  She has been on levo at 19 mcg/min and vaso.  Review of systems;  Unable to obtain 2/2 intubated, sedated     Vitals:   03/01/20 0212 03/01/20 0300 03/01/20 0400 03/01/20 0500  BP:      Pulse:    74  Resp:  (!) 30 (!) 30 (!) 30  Temp:   (!) 97.3 F (36.3 C)   TempSrc:   Oral   SpO2: 100% 100% 98% 99%  Weight:    115.1 kg  Height:        Exam: gen adult female critically ill   Chest coarse breath sounds bilat   tachy S1S2 no rub   Abd soft obese habitus on sedation cannot assess tenderness   Ext no LE edema    LUA AVF per charting; left chest tunneled dialysis catheter Neuro - on Sedation and paralytics  OP HD: TTS  4h 36mn 450/800  2/2.25 bath  111.5kg  Hep 2000  L AVF  - darbe 50 ug q week, last 9/7  - calc 1.0 tiw  - 9/9 Hb 10.0, tsat 22%  Assessment/ Plan: # COVID pna w/ acute on chronic hypoxic respiratory failure:  dexamethasone, status post tocilizumab and remdesivir.  Severe CXR changes.    # Acute hypoxic resp failure  - mechanical ventilation per primary team   # ESRD: TTS HD.  Continue CRRT.  2K dialysate for now.  UF goal keep even to net neg 50 an hour as tolerated - back down to keep even if pressors increase.  Follow PM CRRT labs.  Platelets dropping - stop heparin with CRRT  # Clotted LUA AVF: note TDC placed by IR 9/11. Will need new perm access at some point when pt is more stable.  Tunneled catheter exchanged on 9/27 with IR  # Septic shock - on pressors per pulm  # Volume status:  hypotension; per charting wt's are several kg below dry with attempts to optimize resp status    # Anemia of Chronic Kidney Disease: aranesp increased to 150 mcg for 9/28 and onward for now  # Secondary Hyperparathyroidism/Hyperphosphatemia:   while on CRRT discontinued Auryxia and temporarily discontinued Sensipar 60 mg every other day  #DM2 - management per primary team   Recent Labs  Lab 02/29/20 0430 02/29/20 0430 02/29/20 1110 02/29/20 1539 03/01/20 0334  K 4.8   < > 5.5* 5.1 4.3  BUN 35*   < >  --  32* 29*  CREATININE 3.17*   < >  --  2.38* 1.83*  CALCIUM 8.5*   < >  --  8.6* 8.5*  PHOS 5.0*   < >  --  4.5 3.8  HGB 8.8*  --  9.2*  --   --    < > = values in this interval not displayed.   Inpatient medications: . artificial tears  1 application Both Eyes QO3J . B-complex with vitamin C  1 tablet Per Tube Daily  . chlorhexidine gluconate (MEDLINE KIT)  15 mL Mouth Rinse BID  . Chlorhexidine Gluconate Cloth  6 each Topical Q0600  . darbepoetin (ARANESP) injection - DIALYSIS  150 mcg Intravenous Q Tue-HD  . docusate  100 mg Per Tube BID  . feeding supplement (PROSource  TF)  45 mL Per Tube QID  . heparin injection (subcutaneous)  5,000 Units Subcutaneous Q8H  . insulin aspart  0-20 Units Subcutaneous Q4H  . insulin aspart  8 Units Subcutaneous Q4H  . insulin glargine  25 Units Subcutaneous BID  . lactulose  10 g Per Tube TID  . levothyroxine  125 mcg Per Tube Q0600  . mouth rinse  15 mL Mouth Rinse 10 times per day  . pantoprazole  40 mg Intravenous Q12H  . polyethylene glycol  17 g Per Tube Daily  . sodium chloride flush  10-40 mL Intracatheter Q12H   .  prismasol BGK 4/2.5 500 mL/hr at 02/29/20 2326  .  prismasol BGK 4/2.5 300 mL/hr at 03/01/20 0137  . cisatracurium (NIMBEX) infusion 2 mcg/kg/min (03/01/20 0500)  . feeding supplement (VITAL 1.5 CAL) 50 mL/hr at 03/01/20 0500  . fentaNYL infusion INTRAVENOUS 300 mcg/hr (03/01/20 0500)  . heparin 10,000 units/ 20 mL infusion syringe 1,250 Units/hr (03/01/20 0508)  . midazolam 10 mg/hr (03/01/20 0500)  . norepinephrine (LEVOPHED) Adult infusion 19 mcg/min (03/01/20 0500)  . phenylephrine (NEO-SYNEPHRINE) Adult infusion Stopped (03/01/20 0111)  . prismasol  BGK 2/2.5 dialysis solution 2,000 mL/hr at 03/01/20 0357  . vasopressin 0.04 Units/min (03/01/20 0500)   acetaminophen (TYLENOL) oral liquid 160 mg/5 mL, albuterol, alteplase, fentaNYL, fentaNYL (SUBLIMAZE) injection, fentaNYL (SUBLIMAZE) injection, heparin, heparin, heparin sodium (porcine), heparin, influenza vac split quadrivalent PF, lip balm, midazolam, midazolam, midazolam, sodium chloride flush    Wanda Desanctis, MD 03/01/2020 6:17 AM

## 2020-03-01 NOTE — Progress Notes (Addendum)
NAME:  Wanda Ramirez, MRN:  [id number], DOB:  [birth year], LOS: 34 ADMISSION DATE:  02/13/2020, CONSULTATION DATE: 02/24/2020 REFERRING MD: Dr. Heber Naselle, CHIEF COMPLAINT: Increasing supplemental oxygen demand  Brief History   Wanda Ramirez is a 49 year old female with a past medical history significant for end-stage renal disease on hemodialysis MWF, chronic hypoxic respiratory failure on 3 L nasal cannula at baseline, sleep apnea, diabetes, hypertension, hyperlipidemia, hypothyroidism, and asthma who presented to the emergency department with complaints of worsening shortness of breath, dry cough, generalized body aches, and loose stools.  Patient was diagnosed 9/9 with Covid pneumonia.  Initially she was able to maintain outpatient status but when symptoms worsened 9/14 she presented to the emergency department.  Patient reported dyspnea and weakness worsened to the point where she was unable to ambulate effectively.  She also reports she was unable to maintain adequate oral intake.  Patient received the moderna vaccines in March and April of this year.  Patient presented initially febrile with a temperature of 102.9 and mildly tachypneic with all other vital signs within normal limits.  She was initially managed on 4 L nasal cannula.  Lab work significant for NA 130, K3.8, glucose 454, anion gap 21, lactic acid 1.8, LDH 296, ferritin 6690, CRP 13.3, and procalcitonin 10.51  On review of medical record it appears patient has progressively required increased supplemental oxygen requirement.  Afternoon of 9/24 patient had episode of desaturation requiring further increase in supplemental oxygen.  On chart assessment it appears patient has been placed on 40 L with reported increased work of breathing prompting PCCM consultation  Past Medical History  Sleep apnea Morbid obesity Hypothyroidism Hypertension GERD  End-stage renal disease on HD MWF Diabetes Asthma   Immunization History   Administered Date(s) Administered  . Influenza,inj,Quad PF,6+ Mos 02/09/2018     Significant Hospital Events   Admitted 9/14  9/24 - Lying in bed on side currently undergoing iHD,  She is lethargic but will arouse to verbal stimuli but quickly falls back to sleep.  RN states patient has had progressive decline over the last 12hrs  9/25 - -currently in medical ICU to [facility].  Bed 12.  She has been on BiPAP with Precedex overnight.  This morning on the low-dose of Precedex she got agitated.?  Also had melena.  She also desaturated.  Nursing then increase her Precedex and since then she has been more stable.  Yesterday dialysis was cut short.   9/26 -unable to run CRRT with prone ventilation.  Resume supine ventilation but still unable to draw blood back.  9/27 transferred to 34m. IR replaced HD catheter  Consults:  PCCM  Procedures:  9/23 IR guided tunneled HD cath placement - left subclavian  Significant Diagnostic Tests:  Pulmonary VQ scan at 9/21 > negative  Micro Data:  COVID 9/14 >  Blood cultures 9/13 > negative  Antimicrobials:  Cefepime 9/24 > Azithromycin 9/21 > 9/26 Dexamethasone Remdesivir completed 9/17 > 9/19 Tocilizumab x1  Interim history/subjective:  Tolerating CRRT. Now taking 50cc/hr net off. Still on norepinephrine and vasopressin. TOF 0/4. Synchronous with ventilator. Ventilator settings lower.   Objective   Blood pressure (!) 111/57, pulse 98, temperature 97.7 F (36.5 C), temperature source Oral, resp. rate (!) 30, height 5\' 2"  (1.575 m), weight 115.1 kg, SpO2 96 %.    Vent Mode: PRVC FiO2 (%):  [50 %-60 %] 50 % Set Rate:  [30 bmp] 30 bmp Vt Set:  [400 mL] 400 mL PEEP:  [16 cmH20]  [place] Pressure:  [39 cmH20-40 cmH20] 39 cmH20   Intake/Output Summary (Last 24 hours) at 03/01/2020 0907 Last data filed at 03/01/2020 0800 Gross per 24 hour  Intake 3471.88 ml  Output 4107 ml  Net -635.12 ml   Filed Weights   02/28/20 0500 02/29/20  0500 03/01/20 0500  Weight: 111.9 kg 114.1 kg 115.1 kg  General Appearance:  Critically ill, obese Throat: ETT to vent Lungs: sounds of mechanical ventilation auscultated Heart: tachycardic, regular Abdomen:  Obese Soft, no masses, no organomegaly Neurologic: Sedated no response to stimulation.   Resolved Hospital Problem list     Assessment & Plan:   Critically ill due to acute Hypoxic / Hypercapnic Respiratory Failure requiring mechanical ventilation. Severe ARDS secondary to COVID 19 Pneumonia - unable to tolerate prone ventilation due to hemodynamic instability. And difficulty tolerating CRRT due to catheter flow. - continue lung protective ventilation - ABG this morning shows P/F ratio 114. Will continue NMB and try for gentle volume removal with CRRT until this improves.  -Status post steroid, remdesivir, immunomodulating therapy.  ESRD on HD  -on CRRT - Nephrology following. Agree with gentle volume removal as tolerated. Echo shows RV volume overload.   Acute on chronic Anemia - likely component of chronic disease with blood loss - no obvious signs of bleeding - s/p 2 u prbc on 9/28  Circulatory Shock Unclear source. Treated with 10 days of broad spectrum abx.  Weaning vasopressors for goal MAP>65  Best practice:  Diet:NPO Pain/Anxiety/Delirium protocol (if indicated): PRN VAP protocol (if indicated): N/A DVT prophylaxis: Subq heparin GI prophylaxis: PPI Glucose control: SSI Mobility: Bedrest Code Status: Full Family Communication: updated daughter Wanda Ramirez by phone on 9/30 at 2:50pm.  Disposition: continue ICU care  The patient is critically ill with multiple organ systems failure and requires high complexity decision making for assessment and support, frequent evaluation and titration of therapies, application of advanced monitoring technologies and extensive interpretation of multiple databases.   Critical Care Time devoted to patient care services described in  this note is 34 minutes. This time reflects time of care of this [place] . This critical care time does not reflect separately billable procedures or procedure time, teaching time or supervisory time of PA/NP/Med student/Med Resident etc but could involve care discussion time.  Spero Geralds [facility] 03/01/2020 9:07 AM  Pager: [phone] After hours pager: [phone]    LABS    PULMONARY Recent Labs  Lab 02/26/20 0543 02/26/20 1152 02/26/20 1742 02/28/20 0816 02/29/20 1110  PHART 7.342* 7.220* 7.193* 7.437 7.276*  PCO2ART 44.9 69.1* 68.0* 47.2 55.9*  PO2ART 57* 75* 78* 49* 76*  HCO3 24.4 27.9 25.9 32.7* 26.0  TCO2 26 30 28  34* 28  O2SAT 88.0 90.0 90.0 89.0 93.0    CBC Recent Labs  Lab 02/27/20 0533 02/27/20 0533 02/28/20 0726 02/28/20 0816 02/28/20 1757 02/29/20 0430 02/29/20 1110  HGB 7.3*   < > 5.6*   < > 8.9* 8.8* 9.2*  HCT 23.2*   < > 18.1*   < > 26.2* 26.8* 27.0*  WBC 32.3*  --  26.3*  --   --  26.9*  --   PLT 114*  --  62*  --   --  75*  --    < > = values in this interval not displayed.    COAGULATION Recent Labs  Lab 02/26/20 0709  INR 1.3*    CARDIAC  No results for input(s):  TROPONINI in the last 168 hours. No results for input(s): PROBNP in the last 168 hours.   CHEMISTRY Recent Labs  Lab 02/24/20 1916 02/24/20 2013 02/27/20 0514 02/27/20 1527 02/28/20 0435 02/28/20 0816 02/28/20 1539 02/28/20 1539 02/29/20 0430 02/29/20 0430 02/29/20 1110 02/29/20 1110 02/29/20 1539 03/01/20 0334  NA 140   < > 140   < > 136   < > 138  --  136  --  135  --  135 136  K 3.9   < > 5.1   < > 4.6   < > 4.1   < > 4.8   < > 5.5*   < > 5.1 4.3  CL 101   < > 100   < > 94*  --  92*  --  98  --   --   --  100 99  CO2 23   < > 25   < > 26  --  28  --  24  --   --   --  24 24  GLUCOSE 111*   < > 231*   < > 281*  --  240*  --  387*  --   --   --  382* 254*  BUN 51*   < > 50*   < > 55*  --  41*  --  35*   --   --   --  32* 29*  CREATININE 7.65*   < > 7.18*   < > 6.38*  --  4.32*  --  3.17*  --   --   --  2.38* 1.83*  CALCIUM 8.3*   < > 8.3*   < > 8.2*  --  7.8*  --  8.5*  --   --   --  8.6* 8.5*  MG 2.1  --  2.2  --  2.3  --   --   --  2.9*  --   --   --   --  2.8*  PHOS 4.0   < > 9.0*   < > 8.3*  --  7.3*  --  5.0*  --   --   --  4.5 3.8   < > = values in this interval not displayed.   Estimated Creatinine Clearance: 44.7 mL/min (A) (by C-G formula based on SCr of 1.83 mg/dL (H)).   LIVER Recent Labs  Lab 02/26/20 0709 02/26/20 1611 02/27/20 0514 02/27/20 1527 02/28/20 0435 02/28/20 1539 02/29/20 0430 02/29/20 1539 03/01/20 0334  AST 43*  --  91*  --   --   --   --   --   --   ALT 7  --  17  --   --   --   --   --   --   ALKPHOS 448*  --  350*  --   --   --   --   --   --   BILITOT 0.8  --  0.8  --   --   --   --   --   --   PROT 5.8*  --  5.4*  --   --   --   --   --   --   ALBUMIN 2.2*   < > 1.9*  1.9*   < > 1.8* 1.9* 1.9* 1.9* 1.9*  INR 1.3*  --   --   --   --   --   --   --   --    < > =  values in this interval not displayed.     INFECTIOUS Recent Labs  Lab 02/24/20 2219 02/25/20 9211 02/25/20 0947 02/26/20 0709 02/27/20 0514  LATICACIDVEN  --    < > 1.2 1.7 1.6  PROCALCITON 2.82  --   --   --   --    < > = values in this interval not displayed.     ENDOCRINE CBG (last 3)  Recent Labs    02/29/20 2334 03/01/20 0350 03/01/20 0745  GLUCAP 228* 242* 177*       IMAGING x48h  - image(s) personally visualized  -   highlighted in bold ECHOCARDIOGRAM LIMITED  Result Date: 02/29/2020    ECHOCARDIOGRAM LIMITED REPORT   Patient Name:   Wanda Ramirez Date of Exam: 02/29/2020 Medical Rec #:  [id number]        Height:       62.0 in Accession #:    [id number]       Weight:       251.5 lb Date of Birth:  11/17/1970         BSA:          2.107 m Patient Age:    56 years         BP:           124/69 mmHg Patient Gender: F                HR:           100 bpm. Exam  Location:  Inpatient Procedure: Limited Echo, Limited Color Doppler and Cardiac Doppler Indications:    Shock ([place]) [[id number]]  History:        Patient has no prior history of Echocardiogram examinations.                 Risk Factors:Sleep Apnea, Hypertension, Diabetes and Former                 Smoker.  Sonographer:    Vickie Epley RDCS Referring Phys: [id number] [place]  Sonographer Comments: Echo performed with patient supine and on artificial respirator. Covid positive. IMPRESSIONS  1. Left ventricular ejection fraction, by estimation, is 60 to 65%. The left ventricle has normal function. There is the interventricular septum is flattened in systole and diastole, consistent with right ventricular pressure and volume overload.  2. Right ventricular systolic function is moderately reduced. The right ventricular size is severely enlarged. There is normal pulmonary artery systolic pressure. The estimated right ventricular systolic pressure is 77.4 mmHg.  3. Trivial mitral valve regurgitation. FINDINGS  Left Ventricle: Left ventricular ejection fraction, by estimation, is 60 to 65%. The left ventricle has normal function. The interventricular septum is flattened in systole and diastole, consistent with right ventricular pressure and volume overload. Right Ventricle: The right ventricular size is severely enlarged. Right ventricular systolic function is moderately reduced. There is normal pulmonary artery systolic pressure. The tricuspid regurgitant velocity is 2.47 m/s, and with an assumed right atrial pressure of 8 mmHg, the estimated right ventricular systolic pressure is 12.8 mmHg. Mitral Valve: Trivial mitral valve regurgitation. Tricuspid Valve: Tricuspid valve regurgitation is mild. Pulmonic Valve: Pulmonic valve regurgitation is trivial. LEFT VENTRICLE PLAX 2D LVIDd:         3.50 cm     Diastology LVIDs:         2.60 cm     LV e' medial:    6.31 cm/s LV PW:  1.00 cm     LV E/e' medial:  10.9 LV IVS:         1.00 cm     LV e' lateral:   9.46 cm/s LVOT diam:     1.70 cm     LV E/e' lateral: 7.3 LV SV:         30 LV SV Index:   14 LVOT Area:     2.27 cm  LV Volumes (MOD) LV vol d, MOD A2C: 45.6 ml LV vol d, MOD A4C: 39.4 ml LV vol s, MOD A2C: 25.1 ml LV vol s, MOD A4C: 16.5 ml LV SV MOD A2C:     20.5 ml LV SV MOD A4C:     39.4 ml LV SV MOD BP:      22.7 ml RIGHT VENTRICLE RV S prime:     7.72 cm/s TAPSE (M-mode): 1.5 cm LEFT ATRIUM             Index      RIGHT ATRIUM           Index LA diam:        2.50 cm 1.19 cm/m RA Area:     14.00 cm LA Vol (A2C):   18.4 ml 8.73 ml/m RA Volume:   40.10 ml  19.03 ml/m LA Vol (A4C):   10.8 ml 5.12 ml/m LA Biplane Vol: 14.7 ml 6.98 ml/m  AORTIC VALVE LVOT Vmax:   92.00 cm/s LVOT Vmean:  71.000 cm/s LVOT VTI:    0.130 m  AORTA Ao Root diam: 2.90 cm MITRAL VALVE               TRICUSPID VALVE MV Area (PHT): 4.83 cm    TR Peak grad:   24.4 mmHg MV Decel Time: 157 msec    TR Vmax:        247.00 cm/s MV E velocity: 69.00 cm/s MV A velocity: 49.70 cm/s  SHUNTS MV E/A ratio:  1.39        Systemic VTI:  0.13 m                            Systemic Diam: 1.70 cm Candee Furbish MD Electronically signed by Candee Furbish MD Signature Date/Time: 02/29/2020/12:24:42 PM    Final

## 2020-03-02 DIAGNOSIS — J80 Acute respiratory distress syndrome: Secondary | ICD-10-CM

## 2020-03-02 DIAGNOSIS — U071 COVID-19: Secondary | ICD-10-CM

## 2020-03-02 LAB — POCT I-STAT 7, (LYTES, BLD GAS, ICA,H+H)
Acid-base deficit: 1 mmol/L (ref 0.0–2.0)
Bicarbonate: 26.1 mmol/L (ref 20.0–28.0)
Calcium, Ion: 1.26 mmol/L (ref 1.15–1.40)
HCT: 28 % — ABNORMAL LOW (ref 36.0–46.0)
Hemoglobin: 9.5 g/dL — ABNORMAL LOW (ref 12.0–15.0)
O2 Saturation: 87 %
Patient temperature: 93.8
Potassium: 3.8 mmol/L (ref 3.5–5.1)
Sodium: 137 mmol/L (ref 135–145)
TCO2: 28 mmol/L (ref 22–32)
pCO2 arterial: 50.9 mmHg — ABNORMAL HIGH (ref 32.0–48.0)
pH, Arterial: 7.304 — ABNORMAL LOW (ref 7.350–7.450)
pO2, Arterial: 52 mmHg — ABNORMAL LOW (ref 83.0–108.0)

## 2020-03-02 LAB — RENAL FUNCTION PANEL
Albumin: 2 g/dL — ABNORMAL LOW (ref 3.5–5.0)
Albumin: 2.1 g/dL — ABNORMAL LOW (ref 3.5–5.0)
Anion gap: 10 (ref 5–15)
Anion gap: 11 (ref 5–15)
BUN: 30 mg/dL — ABNORMAL HIGH (ref 6–20)
BUN: 28 mg/dL — ABNORMAL HIGH (ref 6–20)
CO2: 23 mmol/L (ref 22–32)
CO2: 24 mmol/L (ref 22–32)
Calcium: 8.8 mg/dL — ABNORMAL LOW (ref 8.9–10.3)
Calcium: 8.7 mg/dL — ABNORMAL LOW (ref 8.9–10.3)
Chloride: 100 mmol/L (ref 98–111)
Chloride: 102 mmol/L (ref 98–111)
Creatinine, Ser: 1.86 mg/dL — ABNORMAL HIGH (ref 0.44–1.00)
Creatinine, Ser: 1.66 mg/dL — ABNORMAL HIGH (ref 0.44–1.00)
GFR calc Af Amer: 36 mL/min — ABNORMAL LOW (ref >60)
GFR calc Af Amer: 42 mL/min — ABNORMAL LOW (ref >60)
GFR calc non Af Amer: 31 mL/min — ABNORMAL LOW (ref >60)
GFR calc non Af Amer: 36 mL/min — ABNORMAL LOW (ref >60)
Glucose, Bld: 427 mg/dL — ABNORMAL HIGH (ref 70–99)
Glucose, Bld: 198 mg/dL — ABNORMAL HIGH (ref 70–99)
Phosphorus: 4 mg/dL (ref 2.5–4.6)
Phosphorus: 3.9 mg/dL (ref 2.5–4.6)
Potassium: 4.4 mmol/L (ref 3.5–5.1)
Potassium: 3.9 mmol/L (ref 3.5–5.1)
Sodium: 133 mmol/L — ABNORMAL LOW (ref 135–145)
Sodium: 137 mmol/L (ref 135–145)

## 2020-03-02 LAB — GLUCOSE, CAPILLARY
Glucose-Capillary: 416 mg/dL — ABNORMAL HIGH (ref 70–99)
Glucose-Capillary: 320 mg/dL — ABNORMAL HIGH (ref 70–99)
Glucose-Capillary: 312 mg/dL — ABNORMAL HIGH (ref 70–99)
Glucose-Capillary: 303 mg/dL — ABNORMAL HIGH (ref 70–99)
Glucose-Capillary: 225 mg/dL — ABNORMAL HIGH (ref 70–99)
Glucose-Capillary: 245 mg/dL — ABNORMAL HIGH (ref 70–99)
Glucose-Capillary: 256 mg/dL — ABNORMAL HIGH (ref 70–99)
Glucose-Capillary: 235 mg/dL — ABNORMAL HIGH (ref 70–99)
Glucose-Capillary: 227 mg/dL — ABNORMAL HIGH (ref 70–99)
Glucose-Capillary: 194 mg/dL — ABNORMAL HIGH (ref 70–99)
Glucose-Capillary: 189 mg/dL — ABNORMAL HIGH (ref 70–99)
Glucose-Capillary: 175 mg/dL — ABNORMAL HIGH (ref 70–99)
Glucose-Capillary: 150 mg/dL — ABNORMAL HIGH (ref 70–99)
Glucose-Capillary: 142 mg/dL — ABNORMAL HIGH (ref 70–99)
Glucose-Capillary: 163 mg/dL — ABNORMAL HIGH (ref 70–99)
Glucose-Capillary: 212 mg/dL — ABNORMAL HIGH (ref 70–99)
Glucose-Capillary: 208 mg/dL — ABNORMAL HIGH (ref 70–99)
Glucose-Capillary: 211 mg/dL — ABNORMAL HIGH (ref 70–99)

## 2020-03-02 LAB — CULTURE, BLOOD (ROUTINE X 2)
Culture: NO GROWTH
Culture: NO GROWTH
Special Requests: ADEQUATE

## 2020-03-02 LAB — MAGNESIUM: Magnesium: 3 mg/dL — ABNORMAL HIGH (ref 1.7–2.4)

## 2020-03-02 LAB — APTT: aPTT: 33 seconds (ref 24–36)

## 2020-03-02 MED ORDER — INSULIN REGULAR(HUMAN) IN NACL 100-0.9 UT/100ML-% IV SOLN
INTRAVENOUS | Status: DC
  Administered 2020-03-02: 16 [IU]/h via INTRAVENOUS
  Administered 2020-03-02: 10 [IU]/h via INTRAVENOUS
  Administered 2020-03-02: 13 [IU]/h via INTRAVENOUS
  Filled 2020-03-02 (×2): qty 100

## 2020-03-02 MED ORDER — ALTEPLASE 2 MG IJ SOLR
2.0000 mg | Freq: Once | INTRAMUSCULAR | Status: AC
  Administered 2020-03-02: 2 mg

## 2020-03-02 MED ORDER — PRISMASOL BGK 4/2.5 32-4-2.5 MEQ/L IV SOLN
INTRAVENOUS | Status: DC
  Filled 2020-03-02 (×8): qty 5000

## 2020-03-02 MED ORDER — LACTULOSE 10 GM/15ML PO SOLN
30.0000 g | Freq: Three times a day (TID) | ORAL | Status: DC
  Administered 2020-03-02 (×3): 30 g
  Filled 2020-03-02 (×3): qty 45

## 2020-03-02 MED ORDER — SODIUM CHLORIDE 0.9 % IV SOLN
INTRAVENOUS | Status: DC | PRN
  Administered 2020-03-02: 250 mL via INTRAVENOUS
  Administered 2020-04-03: 500 mL via INTRAVENOUS
  Administered 2020-04-07 – 2020-04-08 (×2): 250 mL via INTRAVENOUS

## 2020-03-02 MED ORDER — DEXTROSE 50 % IV SOLN: 0.0000 mL | INTRAVENOUS | Status: DC | PRN

## 2020-03-02 MED ORDER — HEPARIN SODIUM (PORCINE) 1000 UNIT/ML DIALYSIS
1000.0000 [IU] | INTRAMUSCULAR | Status: DC | PRN
  Administered 2020-03-02 – 2020-03-03 (×2): 4000 [IU] via INTRAVENOUS_CENTRAL
  Filled 2020-03-02 (×2): qty 6
  Filled 2020-03-02: qty 5
  Filled 2020-03-02: qty 1
  Filled 2020-03-02: qty 6

## 2020-03-02 NOTE — Progress Notes (Signed)
Patient went into aflutter into 130s but came back down to low 100s. CCM aware will continue to monitor. Modena Morrow E, RN 03/02/2020 2:15 PM

## 2020-03-02 NOTE — Progress Notes (Signed)
Assisted tele visit to patient with family member.  Levon Penning McEachran, RN  

## 2020-03-02 NOTE — Progress Notes (Signed)
NAME:  Wanda Ramirez, MRN:  [id number], DOB:  Jan 11, 1971, LOS: 61 ADMISSION DATE:  02/13/2020, CONSULTATION DATE: 02/24/2020 REFERRING MD: Dr. Heber [place], CHIEF COMPLAINT: Increasing supplemental oxygen demand  Brief History   Wanda Ramirez is a 49 year old female with a past medical history significant for end-stage renal disease on hemodialysis MWF, chronic hypoxic respiratory failure on 3 L nasal cannula at baseline, sleep apnea, diabetes, hypertension, hyperlipidemia, hypothyroidism, and asthma who presented to the emergency department with complaints of worsening shortness of breath, dry cough, generalized body aches, and loose stools.  Patient was diagnosed 9/9 with Covid pneumonia.  Initially she was able to maintain outpatient status but when symptoms worsened 9/14 she presented to the emergency department.  Patient reported dyspnea and weakness worsened to the point where she was unable to ambulate effectively.  She also reports she was unable to maintain adequate oral intake.  Patient received the moderna vaccines in March and April of this year.  Patient presented initially febrile with a temperature of 102.9 and mildly tachypneic with all other vital signs within normal limits.  She was initially managed on 4 L nasal cannula.  Lab work significant for NA 130, K3.8, glucose 454, anion gap 21, lactic acid 1.8, LDH 296, ferritin 6690, CRP 13.3, and procalcitonin 10.51  On review of medical record it appears patient has progressively required increased supplemental oxygen requirement.  Afternoon of 9/24 patient had episode of desaturation requiring further increase in supplemental oxygen.  On chart assessment it appears patient has been placed on 40 L with reported increased work of breathing prompting PCCM consultation  Past Medical History  Sleep apnea Morbid obesity Hypothyroidism Hypertension GERD  End-stage renal disease on HD MWF Diabetes Asthma   Immunization History   Administered Date(s) Administered   Influenza,inj,Quad PF,6+ Mos 02/09/2018     Significant Hospital Events   Admitted 9/14  9/24 - Lying in bed on side currently undergoing iHD,  She is lethargic but will arouse to verbal stimuli but quickly falls back to sleep.  RN states patient has had progressive decline over the last 12hrs  9/25 - -currently in medical ICU to [facility].  Bed 12.  She has been on BiPAP with Precedex overnight.  This morning on the low-dose of Precedex she got agitated.?  Also had melena.  She also desaturated.  Nursing then increase her Precedex and since then she has been more stable.  Yesterday dialysis was cut short.   9/26 -unable to run CRRT with prone ventilation.  Resume supine ventilation but still unable to draw blood back.  9/27 transferred to 70m. IR replaced HD catheter  Consults:  PCCM  Procedures:  9/23 IR guided tunneled HD cath placement - left subclavian  Significant Diagnostic Tests:  Pulmonary VQ scan at 9/21 > negative  Micro Data:  COVID 9/14 >  Blood cultures 9/13 > negative  Antimicrobials:  Cefepime 9/24 > Azithromycin 9/21 > 9/26 Remdesivir completed 9/17 > 9/19 Tocilizumab x1  Interim history/subjective:  Currently tolerating CRRT, still requiring vasopressor support with norepinephrine and vasopressin.   Objective   Blood pressure (!) 105/51, pulse 91, temperature 98.9 F (37.2 C), temperature source Oral, resp. rate (!) 32, height 5\' 2"  (1.575 m), weight 112.7 kg, SpO2 98 %.    Vent Mode: PRVC FiO2 (%):  [50 %] 50 % Set Rate:  [30 bmp-32 bmp] 32 bmp Vt Set:  [400 mL] 400 mL PEEP:  [14 cmH20-16 cmH20] 16 cmH20 Pressure Support:  [16 cmH20] 16 cmH20  Plateau Pressure:  [36 cmH20-39 cmH20] 38 cmH20   Intake/Output Summary (Last 24 hours) at 03/02/2020 1302 Last data filed at 03/02/2020 1200 Gross per 24 hour  Intake 3866.33 ml  Output 4424 ml  Net -557.67 ml   Filed Weights   02/29/20 0500 03/01/20 0500 03/02/20  0500  Weight: 114.1 kg 115.1 kg 112.7 kg     Physical exam: General: Chronically ill-appearing female, orally intubated HEAENT: [place]/AT, eyes anicteric.  ETT and OGT in place Neuro: Sedated, not following commands.  Eyes are closed.  Pupils 3 mm bilateral reactive to light Chest: Bilateral crackles at the bases, no wheezes or rhonchi Heart: Regular rate and rhythm, no murmurs or gallops Abdomen: Soft, nontender, nondistended, bowel sounds present Skin: No rash   Resolved Hospital Problem list     Assessment & Plan:   Acute hypoxic/hypercapnic respiratory failure due to ARDS secondary to COVID 19 Pneumonia Patient's P to F ratio is less than 150 but unable to tolerate prone ventilation due to hemodynamic instability. And difficulty tolerating CRRT due to catheter flow Continue lung protective mechanical ventilation per ARDS protocol Patient's plateau pressure and driving pressures are elevated, she is hypercapnic at baseline with pH of 7.3, unable to decrease tidal volume Target PaO2 55-80: titrate PEEP/FiO2 per protocol Patient has completed steroid, remdesivir, immunomodulating therapy.  ESRD on HD  On CRRT Nephrology following. Agree with gentle volume removal as tolerated. Echo shows RV volume overload.   Acute on chronic Anemia likely component of chronic disease with blood loss no obvious signs of bleeding s/p 2 u prbc on 9/28  Shock, undifferentiated Patient's echocardiogram is normal, cultures have been negative This could be viral induced septic shock Patient is still requiring vasopressors to maintain MAP > 65 Continue to titrate Levophed and stop vasopressin today She is off antibiotics now  Poorly controlled diabetes with hyperglycemia Patient's fingersticks range in 400, she was started on insulin drip overnight Continue insulin drip for now, will try to taper and switch to long acting insulin by tomorrow  Best practice:  Diet:NPO Pain/Anxiety/Delirium  protocol (if indicated): PRN VAP protocol (if indicated): N/A DVT prophylaxis: Subq heparin GI prophylaxis: PPI Glucose control: Insulin infusion Mobility: Bedrest Code Status: Full Family Communication: updated daughter Barnie Alderman by phone  Disposition: continue ICU care  LABS    PULMONARY Recent Labs  Lab 02/26/20 1742 02/28/20 0816 02/29/20 1110 03/01/20 0926 03/02/20 0844  PHART 7.193* 7.437 7.276* 7.329* 7.304*  PCO2ART 68.0* 47.2 55.9* 52.9* 50.9*  PO2ART 78* 49* 76* 55* 52*  HCO3 25.9 32.7* 26.0 28.0 26.1  TCO2 28 34* 28 30 28   O2SAT 90.0 89.0 93.0 86.0 87.0    CBC Recent Labs  Lab 02/27/20 0533 02/27/20 0533 02/28/20 0726 02/28/20 0816 02/29/20 0430 02/29/20 0430 02/29/20 1110 03/01/20 0926 03/02/20 0844  HGB 7.3*   < > 5.6*   < > 8.8*   < > 9.2* 8.8* 9.5*  HCT 23.2*   < > 18.1*   < > 26.8*   < > 27.0* 26.0* 28.0*  WBC 32.3*  --  26.3*  --  26.9*  --   --   --   --   PLT 114*  --  62*  --  75*  --   --   --   --    < > = values in this interval not displayed.    COAGULATION Recent Labs  Lab 02/26/20 0709  INR 1.3*    CARDIAC  No results for input(s):  TROPONINI in the last 168 hours. No results for input(s): PROBNP in the last 168 hours.   CHEMISTRY Recent Labs  Lab 02/27/20 0514 02/27/20 1527 02/28/20 0435 02/28/20 0816 02/29/20 0430 02/29/20 1110 02/29/20 1539 02/29/20 1539 03/01/20 0334 03/01/20 0334 03/01/20 0926 03/01/20 0926 03/01/20 1507 03/01/20 1507 03/02/20 0437 03/02/20 0844  NA 140   < > 136   < > 136   < > 135   < > 136  --  137  --  134*  --  133* 137  K 5.1   < > 4.6   < > 4.8   < > 5.1   < > 4.3   < > 4.4   < > 4.4   < > 4.4 3.8  CL 100   < > 94*   < > 98  --  100  --  99  --   --   --  99  --  100  --   CO2 25   < > 26   < > 24  --  24  --  24  --   --   --  24  --  23  --   GLUCOSE 231*   < > 281*   < > 387*  --  382*  --  254*  --   --   --  245*  --  427*  --   BUN 50*   < > 55*   < > 35*  --  32*  --  29*  --    --   --  24*  --  30*  --   CREATININE 7.18*   < > 6.38*   < > 3.17*  --  2.38*  --  1.83*  --   --   --  1.79*  --  1.86*  --   CALCIUM 8.3*   < > 8.2*   < > 8.5*  --  8.6*  --  8.5*  --   --   --  8.8*  --  8.8*  --   MG 2.2  --  2.3  --  2.9*  --   --   --  2.8*  --   --   --   --   --  3.0*  --   PHOS 9.0*   < > 8.3*   < > 5.0*  --  4.5  --  3.8  --   --   --  4.1  --  4.0  --    < > = values in this interval not displayed.   Estimated Creatinine Clearance: 43.4 mL/min (A) (by C-G formula based on SCr of 1.86 mg/dL (H)).   LIVER Recent Labs  Lab 02/26/20 0709 02/26/20 1611 02/27/20 0514 02/27/20 1527 02/29/20 0430 02/29/20 1539 03/01/20 0334 03/01/20 1507 03/02/20 0437  AST 43*  --  91*  --   --   --   --   --   --   ALT 7  --  17  --   --   --   --   --   --   ALKPHOS 448*  --  350*  --   --   --   --   --   --   BILITOT 0.8  --  0.8  --   --   --   --   --   --   PROT 5.8*  --  5.4*  --   --   --   --   --   --  ALBUMIN 2.2*   < > 1.9*   1.9*   < > 1.9* 1.9* 1.9* 2.0* 2.0*  INR 1.3*  --   --   --   --   --   --   --   --    < > = values in this interval not displayed.     INFECTIOUS Recent Labs  Lab 02/24/20 2219 02/25/20 8592 02/25/20 0947 02/26/20 0709 02/27/20 0514  LATICACIDVEN  --    < > 1.2 1.7 1.6  PROCALCITON 2.82  --   --   --   --    < > = values in this interval not displayed.     ENDOCRINE CBG (last 3)  Recent Labs    03/02/20 0912 03/02/20 1022 03/02/20 1107  GLUCAP 225* 245* 256*       IMAGING x48h  - image(s) personally visualized  -   highlighted in bold No results found.   Total critical care time: 45 minutes  Performed by: [facility] care time was exclusive of separately billable procedures and treating other patients.   Critical care was necessary to treat or prevent imminent or life-threatening deterioration.   Critical care was time spent personally by me on the following activities: development of  treatment plan with patient and/or surrogate as well as nursing, discussions with consultants, evaluation of patient's response to treatment, examination of patient, obtaining history from patient or surrogate, ordering and performing treatments and interventions, ordering and review of laboratory studies, ordering and review of radiographic studies, pulse oximetry and re-evaluation of patient's condition.   Jacky Kindle MD Critical care physician [facility]  Pager: [phone] Mobile: [phone]

## 2020-03-02 NOTE — Progress Notes (Signed)
Notified Dr. Royce Macadamia about high filter numbers that are a marker for filter clotting. A verbal order was given to stop CRRT and attempt to TPA HD catheter first.   Antonietta Breach, RN

## 2020-03-02 NOTE — Progress Notes (Signed)
Patient became highly desynchronous with vent despite fentanyl and versed boluses. Nimbex infusion has been off since 1835. Nimbex Infusion restarted.

## 2020-03-02 NOTE — Progress Notes (Signed)
Assisted tele visit to patient with family member.  [name], RN  

## 2020-03-02 NOTE — Progress Notes (Signed)
No bowel movement for over 72 hrs despite administering anti-constipation meds. [facility] made aware.   Jacqualyn Posey, RN 03/02/20 1:49 AM

## 2020-03-02 NOTE — Progress Notes (Signed)
Contacted Dr. Mauri Brooklyn with Nephrology about absent thrill/bruit fistula. Requested that we continue to monitoring the site.   Jacqualyn Posey, RN 1:44 AM 03/02/20

## 2020-03-02 NOTE — Progress Notes (Signed)
[facility] Progress Note Patient Name: Wanda Ramirez DOB: March 26, 1971 MRN: [id number]   Date of Service  03/02/2020  HPI/Events of Note  Blood sugar 416 mg / dl  eICU Interventions  Insulin infusion ordered.        Kerry Kass Brenly Trawick 03/02/2020, 4:15 AM

## 2020-03-02 NOTE — Progress Notes (Signed)
[facility] Progress Note  Subjective:    Off CRRT now for a circuit change and to be back on soon.  She had 4.2 liters UF with CRRT over 9/30.  Goal was reduced to keep even as tolerated after inc in pressors per nursing.  She has been on levo at 26 and vaso.  Nephrology o/n was called as AVF with no bruit or thrill.  Per RN patient hasn't had issues with clotting since stopping the heparin  Review of systems;  Unable to obtain 2/2 intubated, sedated     Vitals:   03/02/20 0119 03/02/20 0200 03/02/20 0230 03/02/20 0337  BP: 129/70     Pulse: 95     Resp:  (!) 30 (!) 30   Temp:    (!) 96.8 F (36 C)  TempSrc:    Axillary  SpO2: 90%     Weight:      Height:        Exam: gen adult female critically ill intubated  Chest coarse breath sounds bilat   tachy S1S2 no rub   Abd soft obese habitus on sedation cannot assess tenderness   Ext no LE edema    LUA AVF no b/t; left chest tunneled dialysis catheter Neuro - on Sedation and paralytics  OP HD: TTS  4h 45mn 450/800  2/2.25 bath  111.5kg  Hep 2000  L AVF  - darbe 50 ug q week, last 9/7  - calc 1.0 tiw  - 9/9 Hb 10.0, tsat 22%  Assessment/ Plan: # COVID pna w/ acute on chronic hypoxic respiratory failure:  dexamethasone, status post tocilizumab and remdesivir.  Severe CXR changes.    # Acute hypoxic resp failure  - mechanical ventilation per primary team   # ESRD: TTS HD.  Continue CRRT.  2K dialysate and other fluids 4K for now.  UF goal keep even as tolerated.  Follow PM CRRT labs. As plt dropped stopped heparin with CRRT  # Clotted LUA AVF: note TDC placed by IR 9/11. Will need new perm access at some point when pt is more stable.  Tunneled catheter exchanged on 9/27 with IR  # Septic shock - 2/2 covid. on pressors per pulm  # Volume status:  hypotension; per charting wt's are several kg below dry with attempts to optimize resp status    # Anemia of Chronic Kidney Disease: aranesp increased to 150 mcg  for 9/28 and onward for now  # Secondary Hyperparathyroidism/Hyperphosphatemia:  while on CRRT discontinued Auryxia and temporarily discontinued Sensipar 60 mg every other day  #DM2 - management per primary team   Recent Labs  Lab 02/29/20 1110 02/29/20 1539 03/01/20 0334 03/01/20 0926 03/01/20 1507 03/02/20 0437  K 5.5*   < >   < > 4.4 4.4 4.4  BUN  --    < >   < >  --  24* 30*  CREATININE  --    < >   < >  --  1.79* 1.86*  CALCIUM  --    < >   < >  --  8.8* 8.8*  PHOS  --    < >   < >  --  4.1 4.0  HGB 9.2*  --   --  8.8*  --   --    < > = values in this interval not displayed.   Inpatient medications: . artificial tears  1 application Both Eyes [id number] . B-complex with vitamin C  1 tablet Per  Tube Daily  . chlorhexidine gluconate (MEDLINE KIT)  15 mL Mouth Rinse BID  . Chlorhexidine Gluconate Cloth  6 each Topical Q0600  . darbepoetin (ARANESP) injection - DIALYSIS  150 mcg Intravenous Q Tue-HD  . docusate  100 mg Per Tube BID  . feeding supplement (PROSource TF)  45 mL Per Tube QID  . heparin injection (subcutaneous)  5,000 Units Subcutaneous Q8H  . levothyroxine  125 mcg Per Tube Q0600  . mouth rinse  15 mL Mouth Rinse 10 times per day  . pantoprazole  40 mg Intravenous Q12H  . polyethylene glycol  17 g Per Tube Daily  . sodium chloride flush  10-40 mL Intracatheter Q12H   .  prismasol BGK 4/2.5 500 mL/hr at 03/01/20 2006  .  prismasol BGK 4/2.5 300 mL/hr at 03/01/20 2006  . cisatracurium (NIMBEX) infusion 1.5 mcg/kg/min (03/02/20 0500)  . feeding supplement (VITAL 1.5 CAL) 50 mL/hr at 03/02/20 0500  . fentaNYL infusion INTRAVENOUS 300 mcg/hr (03/02/20 0500)  . insulin 13 Units/hr (03/02/20 0519)  . midazolam 10 mg/hr (03/02/20 0500)  . norepinephrine (LEVOPHED) Adult infusion 28 mcg/min (03/02/20 0500)  . phenylephrine (NEO-SYNEPHRINE) Adult infusion Stopped (03/01/20 1725)  . prismasol BGK 2/2.5 dialysis solution 2,000 mL/hr at 03/01/20 2226  . vasopressin 0.04  Units/min (03/02/20 0500)   acetaminophen (TYLENOL) oral liquid 160 mg/5 mL, albuterol, alteplase, dextrose, fentaNYL, fentaNYL (SUBLIMAZE) injection, fentaNYL (SUBLIMAZE) injection, heparin sodium (porcine), influenza vac split quadrivalent PF, lip balm, midazolam, midazolam, midazolam, sodium chloride flush    Claudia Desanctis, MD 03/02/2020 6:38 AM

## 2020-03-02 NOTE — Progress Notes (Addendum)
Nutrition Follow-up / Consult  DOCUMENTATION CODES:   Morbid obesity  INTERVENTION:   Continue tube feeding via OG tube: Vital 1.5 at 50 ml/h (1200 ml per day) Prosource TF 45 ml QID  Provides 1960 kcal, 125 gm protein, 917 ml free water daily.  Continue B complex with vitamin C to make up for losses while on CRRT.  NUTRITION DIAGNOSIS:   Inadequate oral intake related to inability to eat as evidenced by NPO status.  Ongoing   GOAL:   Provide needs based on ASPEN/SCCM guidelines  Met with TF  MONITOR:   Vent status, Labs, TF tolerance  REASON FOR ASSESSMENT:   Consult Assessment of nutrition requirement/status  ASSESSMENT:   49 year old female with a past medical history significant for end-stage renal disease on hemodialysis MWF, chronic hypoxic respiratory failure on 3 L nasal cannula at baseline, sleep apnea, diabetes, hypertension, hyperlipidemia, hypothyroidism, and asthma presents with complaints of worsening shortness of breath. Previously diagnosed with COVID PNA on 9/9.  Patient is no longer being proned. CRRT off this AM for a circuit change. Being restarted. 4.2 L UF from CRRT 9/30.  Currently receiving Vital 1.5 via OGT at 50 ml/h (1200 ml/day) with Prosource TF 45 ml QID to provide 1960 kcals, 125 gm protein, 917 ml free water daily. Tolerating well per discussion with RN.   Patient remains intubated on ventilator support. MV: 12 L/min Temp (24hrs), Avg:97.8 F (36.6 C), Min:96.8 F (36 C), Max:98.9 F (37.2 C)   108 kg on admission Currently 112.7 kg  Labs reviewed. BUN 30, creat 1.86, mag 3 CBG: [phone]  Medications reviewed and include B-complex with vitamin C, aranesp, colace, lactulose, miralax, Nimbex, insulin drip, levophed, vasopressin.  Diet Order:   Diet Order            Diet NPO time specified  Diet effective now                 EDUCATION NEEDS:   Not appropriate for education at this time  Skin:  Skin  Assessment: Reviewed RN Assessment  Last BM:  9/26 type 6  Height:   Ht Readings from Last 1 Encounters:  02/29/20 _0  (1.575 m)    Weight:   Wt Readings from Last 1 Encounters:  03/02/20 112.7 kg    Ideal Body Weight:  50 kg  BMI:  Body mass index is 45.44 kg/m.  Estimated Nutritional Needs:   Kcal:  1850-2100  Protein:  125 gm  Fluid:  1 L + UOP    Lucas Mallow, RD, LDN, CNSC Please refer to Amion for contact information.

## 2020-03-03 LAB — GLUCOSE, CAPILLARY
Glucose-Capillary: 146 mg/dL — ABNORMAL HIGH (ref 70–99)
Glucose-Capillary: 172 mg/dL — ABNORMAL HIGH (ref 70–99)
Glucose-Capillary: 174 mg/dL — ABNORMAL HIGH (ref 70–99)
Glucose-Capillary: 179 mg/dL — ABNORMAL HIGH (ref 70–99)
Glucose-Capillary: 193 mg/dL — ABNORMAL HIGH (ref 70–99)
Glucose-Capillary: 197 mg/dL — ABNORMAL HIGH (ref 70–99)
Glucose-Capillary: 200 mg/dL — ABNORMAL HIGH (ref 70–99)
Glucose-Capillary: 234 mg/dL — ABNORMAL HIGH (ref 70–99)
Glucose-Capillary: 239 mg/dL — ABNORMAL HIGH (ref 70–99)
Glucose-Capillary: 267 mg/dL — ABNORMAL HIGH (ref 70–99)

## 2020-03-03 LAB — RENAL FUNCTION PANEL
Albumin: 1.9 g/dL — ABNORMAL LOW (ref 3.5–5.0)
Anion gap: 9 (ref 5–15)
BUN: 33 mg/dL — ABNORMAL HIGH (ref 6–20)
CO2: 23 mmol/L (ref 22–32)
Calcium: 8.7 mg/dL — ABNORMAL LOW (ref 8.9–10.3)
Chloride: 104 mmol/L (ref 98–111)
Creatinine, Ser: 2.15 mg/dL — ABNORMAL HIGH (ref 0.44–1.00)
GFR calc Af Amer: 30 mL/min — ABNORMAL LOW (ref >60)
GFR calc non Af Amer: 26 mL/min — ABNORMAL LOW (ref >60)
Glucose, Bld: 204 mg/dL — ABNORMAL HIGH (ref 70–99)
Phosphorus: 3.6 mg/dL (ref 2.5–4.6)
Potassium: 3.6 mmol/L (ref 3.5–5.1)
Sodium: 136 mmol/L (ref 135–145)

## 2020-03-03 LAB — BASIC METABOLIC PANEL
Anion gap: 12 (ref 5–15)
BUN: 38 mg/dL — ABNORMAL HIGH (ref 6–20)
CO2: 22 mmol/L (ref 22–32)
Calcium: 8.9 mg/dL (ref 8.9–10.3)
Chloride: 103 mmol/L (ref 98–111)
Creatinine, Ser: 2.66 mg/dL — ABNORMAL HIGH (ref 0.44–1.00)
GFR calc Af Amer: 23 mL/min — ABNORMAL LOW (ref >60)
GFR calc non Af Amer: 20 mL/min — ABNORMAL LOW (ref >60)
Glucose, Bld: 234 mg/dL — ABNORMAL HIGH (ref 70–99)
Potassium: 4.9 mmol/L (ref 3.5–5.1)
Sodium: 137 mmol/L (ref 135–145)

## 2020-03-03 LAB — POCT I-STAT 7, (LYTES, BLD GAS, ICA,H+H)
Acid-base deficit: 4 mmol/L — ABNORMAL HIGH (ref 0.0–2.0)
Bicarbonate: 23.8 mmol/L (ref 20.0–28.0)
Calcium, Ion: 1.22 mmol/L (ref 1.15–1.40)
HCT: 27 % — ABNORMAL LOW (ref 36.0–46.0)
Hemoglobin: 9.2 g/dL — ABNORMAL LOW (ref 12.0–15.0)
O2 Saturation: 90 %
Patient temperature: 100.4
Potassium: 4.6 mmol/L (ref 3.5–5.1)
Sodium: 136 mmol/L (ref 135–145)
TCO2: 25 mmol/L (ref 22–32)
pCO2 arterial: 56.9 mmHg — ABNORMAL HIGH (ref 32.0–48.0)
pH, Arterial: 7.235 — ABNORMAL LOW (ref 7.350–7.450)
pO2, Arterial: 74 mmHg — ABNORMAL LOW (ref 83.0–108.0)

## 2020-03-03 LAB — PHOSPHORUS: Phosphorus: 5.3 mg/dL — ABNORMAL HIGH (ref 2.5–4.6)

## 2020-03-03 LAB — APTT
aPTT: 38 seconds — ABNORMAL HIGH (ref 24–36)
aPTT: 63 seconds — ABNORMAL HIGH (ref 24–36)
aPTT: 73 seconds — ABNORMAL HIGH (ref 24–36)

## 2020-03-03 LAB — CBC
HCT: 27.2 % — ABNORMAL LOW (ref 36.0–46.0)
Hemoglobin: 8.4 g/dL — ABNORMAL LOW (ref 12.0–15.0)
MCH: 30 pg (ref 26.0–34.0)
MCHC: 30.9 g/dL (ref 30.0–36.0)
MCV: 97.1 fL (ref 80.0–100.0)
Platelets: 63 10*3/uL — ABNORMAL LOW (ref 150–400)
RBC: 2.8 MIL/uL — ABNORMAL LOW (ref 3.87–5.11)
RDW: 20.1 % — ABNORMAL HIGH (ref 11.5–15.5)
WBC: 24.3 10*3/uL — ABNORMAL HIGH (ref 4.0–10.5)
nRBC: 32.5 % — ABNORMAL HIGH (ref 0.0–0.2)

## 2020-03-03 LAB — MAGNESIUM: Magnesium: 3.1 mg/dL — ABNORMAL HIGH (ref 1.7–2.4)

## 2020-03-03 MED ORDER — DEXTROSE 10 % IV SOLN: INTRAVENOUS | Status: DC | PRN

## 2020-03-03 MED ORDER — PRISMASOL BGK 0/2.5 32-2.5 MEQ/L IV SOLN
INTRAVENOUS | Status: DC
  Filled 2020-03-03 (×13): qty 5000

## 2020-03-03 MED ORDER — INSULIN ASPART 100 UNIT/ML ~~LOC~~ SOLN
3.0000 [IU] | SUBCUTANEOUS | Status: DC
  Administered 2020-03-03: 3 [IU] via SUBCUTANEOUS
  Administered 2020-03-03 (×4): 6 [IU] via SUBCUTANEOUS
  Administered 2020-03-04: 9 [IU] via SUBCUTANEOUS
  Administered 2020-03-04 (×2): 6 [IU] via SUBCUTANEOUS
  Administered 2020-03-04: 9 [IU] via SUBCUTANEOUS
  Administered 2020-03-04: 6 [IU] via SUBCUTANEOUS
  Administered 2020-03-05 (×2): 3 [IU] via SUBCUTANEOUS
  Administered 2020-03-05 (×2): 6 [IU] via SUBCUTANEOUS
  Administered 2020-03-05: 9 [IU] via SUBCUTANEOUS
  Administered 2020-03-05: 6 [IU] via SUBCUTANEOUS
  Administered 2020-03-06 (×3): 3 [IU] via SUBCUTANEOUS
  Administered 2020-03-06: 6 [IU] via SUBCUTANEOUS
  Administered 2020-03-06: 3 [IU] via SUBCUTANEOUS
  Administered 2020-03-07 (×2): 6 [IU] via SUBCUTANEOUS
  Administered 2020-03-07 (×2): 3 [IU] via SUBCUTANEOUS
  Administered 2020-03-08: 9 [IU] via SUBCUTANEOUS
  Administered 2020-03-08: 6 [IU] via SUBCUTANEOUS
  Administered 2020-03-08: 3 [IU] via SUBCUTANEOUS
  Administered 2020-03-08: 6 [IU] via SUBCUTANEOUS
  Administered 2020-03-08: 3 [IU] via SUBCUTANEOUS
  Administered 2020-03-09 (×2): 9 [IU] via SUBCUTANEOUS
  Administered 2020-03-09: 6 [IU] via SUBCUTANEOUS
  Administered 2020-03-09: 9 [IU] via SUBCUTANEOUS
  Administered 2020-03-09: 6 [IU] via SUBCUTANEOUS
  Administered 2020-03-09: 9 [IU] via SUBCUTANEOUS

## 2020-03-03 MED ORDER — INSULIN ASPART 100 UNIT/ML ~~LOC~~ SOLN
2.0000 [IU] | SUBCUTANEOUS | Status: DC
  Administered 2020-03-03 (×2): 6 [IU] via SUBCUTANEOUS

## 2020-03-03 MED ORDER — SODIUM CHLORIDE 0.9% FLUSH: 3.0000 mL | INTRAVENOUS | Status: DC | PRN

## 2020-03-03 MED ORDER — INSULIN ASPART 100 UNIT/ML ~~LOC~~ SOLN
3.0000 [IU] | SUBCUTANEOUS | Status: DC
  Administered 2020-03-03 – 2020-03-04 (×7): 3 [IU] via SUBCUTANEOUS

## 2020-03-03 MED ORDER — POLYETHYLENE GLYCOL 3350 17 G PO PACK
17.0000 g | PACK | Freq: Two times a day (BID) | ORAL | Status: DC
  Administered 2020-03-03 – 2020-03-13 (×13): 17 g
  Filled 2020-03-03 (×14): qty 1

## 2020-03-03 MED ORDER — INSULIN DETEMIR 100 UNIT/ML ~~LOC~~ SOLN
10.0000 [IU] | Freq: Two times a day (BID) | SUBCUTANEOUS | Status: DC
  Administered 2020-03-03 – 2020-03-09 (×13): 10 [IU] via SUBCUTANEOUS
  Filled 2020-03-03 (×15): qty 0.1

## 2020-03-03 MED ORDER — ARGATROBAN 50 MG/50ML IV SOLN
0.5000 ug/kg/min | INTRAVENOUS | Status: DC
  Administered 2020-03-03 (×2): 0.5 ug/kg/min via INTRAVENOUS
  Administered 2020-03-04: 0.502 ug/kg/min via INTRAVENOUS
  Administered 2020-03-05: 0.5 ug/kg/min via INTRAVENOUS
  Filled 2020-03-03 (×4): qty 50

## 2020-03-03 MED ORDER — SODIUM CHLORIDE 0.9 % IV SOLN: 250.0000 mL | INTRAVENOUS | Status: DC | PRN

## 2020-03-03 MED ORDER — SODIUM CHLORIDE 0.9% FLUSH
3.0000 mL | Freq: Two times a day (BID) | INTRAVENOUS | Status: DC
  Administered 2020-03-03 – 2020-04-05 (×36): 3 mL via INTRAVENOUS

## 2020-03-03 NOTE — Progress Notes (Addendum)
ANTICOAGULATION CONSULT NOTE - Initial Consult  Pharmacy Consult for Argatroban Indication: Possible HIT  Allergies  Allergen Reactions  . Hydrocodone Itching and Nausea And Vomiting    Patient Measurements: Height: 5\' 2"  (157.5 cm) Weight: 112.9 kg (248 lb 14.4 oz) IBW/kg (Calculated) : 50.1   Vital Signs: Temp: 97.7 F (36.5 C) (10/02 0733) Temp Source: Oral (10/02 0733) Pulse Rate: 113 (10/02 0400)  Labs: Recent Labs    03/01/20 0334 03/01/20 0926 03/02/20 0437 03/02/20 0844 03/02/20 0844 03/02/20 1533 03/03/20 0030 03/03/20 0211 03/03/20 0317  HGB  --    < >  --  9.5*   < >  --   --  8.4* 9.2*  HCT  --    < >  --  28.0*  --   --   --  27.2* 27.0*  PLT  --   --   --   --   --   --   --  63*  --   APTT 177*  --  33  --   --   --  38*  --   --   CREATININE 1.83*   < > 1.86*  --   --  1.66*  --  2.66*  --    < > = values in this interval not displayed.    Estimated Creatinine Clearance: 30.4 mL/min (A) (by C-G formula based on SCr of 2.66 mg/dL (H)).   Medical History: Past Medical History:  Diagnosis Date  . Arthritis   . Asthma   . Chronic back pain   . Diabetes mellitus ([place])   . ESRD (end stage renal disease) on dialysis (HCC)    TTS  . Essential hypertension   . GERD (gastroesophageal reflux disease)   . Glaucoma   . Hyperlipidemia   . Hypothyroidism   . Morbid obesity ([place])   . Peripheral neuropathy   . Sleep apnea    wears CPAP, does not know setting    Assessment: 49 years of age female with COVID pneumonia on CRRT and sepsis requiring vasopressors now with thrombocytopenia. Pharmacy consulted to start Argatroban therapy while checking for possibility of HIT.   HIT Ab has been sent. 4T score ~4 for timing and amount of platelet decline.  Platelets are down to 63 today (baseline ~230s)  Heparin has been removed from CRRT - only in catheters. Subcutaneous Heparin for DVT prophylaxis has been discontinued. Allergy alert has been added to  chart at this time until lab values result. No bleeding noted.  Baseline aPTT 38.  Goal of Therapy:  aPTT 50-90 seconds Monitor platelets by anticoagulation protocol: Yes   Plan:  Discontinue subcutaneous heparin therapy.  Start Argatroban therapy at 0.5 mcg/kg/min.  APTT in 4 hours - will plan for 2 therapeutic aPTTs at q4h then go to q12 hours once therapeutic while in ICU.  Monitor for signs of clot or bleeding.  Follow-up HIT Ab result  *Patient is currently on a Oxiris filter for CRRT which is contraindicated in HIT Patients - will need to change to non-heparinized graft filter - discussed with RN   Sloan Leiter, PharmD, BCPS, BCCCP Clinical Pharmacist Please refer to [facility] for [place] numbers 03/03/2020,8:39 AM

## 2020-03-03 NOTE — Progress Notes (Addendum)
eLink Physician-Brief Progress Note Patient Name: DANNICA BICKHAM DOB: [birth year] MRN: [id number]   Date of Service  03/03/2020  HPI/Events of Note  Multiple loose stools. RN requests flexiseal. I do note that the patient has had worsening thrombocytopenia (platelets down to 75 when last checked 3 days ago and downtrending).  Also, patient just had some emesis of tube feeds. She is on an insulin drip for glycemic control while she had been receiving TF at goal rate of 50 cc/hr. Now that TF are on hold due to emesis, RN wondering if insulin drip should be held. On review of chart, patient appears to be a Type 2 diabetic.   eICU Interventions  Will check CBC to ensure platelets are not significantly lower prior to ordering FMS.  Hold insulin drip while tube feeds are held. Continue to monitor POC glucose closely.   ADDENDUM: - Platelets in 88s. Ordered FMS. - POC glucose 234 with insulin drip off and tube feeds off. Discontinued insulin drip order and changed to medium-intensity Q4H sliding scale insulin for now.  Intervention Category Intermediate Interventions: Other:  Charlott Rakes 03/03/2020, 12:18 AM

## 2020-03-03 NOTE — Progress Notes (Addendum)
[facility] Progress Note  Subjective:    Seen and examined on CRRT.  Got TPA overnight.  She had 2.4 liters UF with CRRT over 10/1.  Goal of keep even as tolerated.  On levo at 20 mcg/min and vaso.  heparin lock was ordered for catheter per rn but she wasn't started on heparin.  Per RN paralytics were off at the start of shift but she didn't tolerate well.       Review of systems;  Unable to obtain 2/2 intubated, sedated     Vitals:   03/03/20 0231 03/03/20 0300 03/03/20 0335 03/03/20 0400  BP:      Pulse: (!) 130 (!) 132  (!) 113  Resp: (!) 32 (!) 32  (!) 0  Temp:   98.2 F (36.8 C)   TempSrc:   Oral   SpO2: 99% 100%  100%  Weight:      Height:        Exam: gen adult female critically ill intubated    Chest coarse breath sounds and rhonchi bilat  FIO2 50 and PEEP 16  tachy S1S2 tachycardic  Abd soft obese habitus on sedation cannot assess tenderness   Ext no LE edema    LUA AVF no b/t; left chest tunneled dialysis catheter Neuro - on Sedation and paralytics  OP HD: TTS  4h 54mn 450/800  2/2.25 bath  111.5kg  Hep 2000  L AVF  - darbe 50 ug q week, last 9/7  - calc 1.0 tiw  - 9/9 Hb 10.0, tsat 22%  Assessment/ Plan: # COVID pna w/ acute on chronic hypoxic respiratory failure:  dexamethasone, status post tocilizumab and remdesivir.  Severe CXR changes.    # Acute hypoxic resp failure  - mechanical ventilation per primary team   # ESRD: TTS HD.  Continue CRRT.  Change back to 2K dialysate and keep other fluids 4K for now.  UF goal keep even as tolerated.  Follow PM CRRT labs. As plt dropped stopped heparin with CRRT other than catheter locks.  # Clotted LUA AVF: note TDC placed by IR 9/11. Will need new perm access at some point when pt is more stable.  Tunneled catheter exchanged on 9/27 with IR  # Septic shock - 2/2 covid. on pressors per pulm  # Volume status:  hypotension; per charting wt's are several kg below dry with attempts to optimize resp  status    # Anemia of Chronic Kidney Disease: aranesp increased to 150 mcg for 9/28 and onward for now  # Secondary Hyperparathyroidism/Hyperphosphatemia:  while on CRRT discontinued Auryxia and temporarily discontinued Sensipar 60 mg every other day  #DM2 - management per primary team   Recent Labs  Lab 03/02/20 0844 03/02/20 1533 03/03/20 0211 03/03/20 0317  K   < > 3.9 4.9 4.6  BUN  --  28* 38*  --   CREATININE  --  1.66* 2.66*  --   CALCIUM  --  8.7* 8.9  --   PHOS  --  3.9 5.3*  --   HGB   < >  --  8.4* 9.2*   < > = values in this interval not displayed.   Inpatient medications: . artificial tears  1 application Both Eyes QM0N . B-complex with vitamin C  1 tablet Per Tube Daily  . chlorhexidine gluconate (MEDLINE KIT)  15 mL Mouth Rinse BID  . Chlorhexidine Gluconate Cloth  6 each Topical Q0600  . darbepoetin (ARANESP) injection - DIALYSIS  150 mcg Intravenous Q Tue-HD  . docusate  100 mg Per Tube BID  . feeding supplement (PROSource TF)  45 mL Per Tube QID  . heparin injection (subcutaneous)  5,000 Units Subcutaneous Q8H  . insulin aspart  2-6 Units Subcutaneous Q4H  . lactulose  30 g Per Tube TID  . levothyroxine  125 mcg Per Tube Q0600  . mouth rinse  15 mL Mouth Rinse 10 times per day  . pantoprazole  40 mg Intravenous Q12H  . polyethylene glycol  17 g Per Tube Daily  . sodium chloride flush  10-40 mL Intracatheter Q12H   .  prismasol BGK 4/2.5 500 mL/hr at 03/02/20 1721  .  prismasol BGK 4/2.5 300 mL/hr at 03/02/20 1346  . sodium chloride 10 mL/hr at 03/03/20 0500  . cisatracurium (NIMBEX) infusion 2.5 mcg/kg/min (03/03/20 0500)  . feeding supplement (VITAL 1.5 CAL) Stopped (03/03/20 0058)  . fentaNYL infusion INTRAVENOUS 300 mcg/hr (03/03/20 0500)  . midazolam 10 mg/hr (03/03/20 0500)  . norepinephrine (LEVOPHED) Adult infusion 26 mcg/min (03/03/20 0500)  . prismasol BGK 4/2.5 1,800 mL/hr at 03/03/20 0609  . vasopressin 0.04 Units/min (03/03/20 0500)    sodium chloride, acetaminophen (TYLENOL) oral liquid 160 mg/5 mL, albuterol, fentaNYL, fentaNYL (SUBLIMAZE) injection, fentaNYL (SUBLIMAZE) injection, heparin, heparin sodium (porcine), influenza vac split quadrivalent PF, lip balm, midazolam, midazolam, midazolam, sodium chloride flush    Claudia Desanctis, MD 03/03/2020 6:37 AM

## 2020-03-03 NOTE — Progress Notes (Signed)
Assisted tele visit to patient with family member.  Kelsey Durflinger Ann, RN  

## 2020-03-03 NOTE — Progress Notes (Signed)
[facility] for Argatroban Indication: Possible HIT  Allergies  Allergen Reactions  . Heparin Other (See Comments)    Possible HIT - HIT Ab is pending  . Hydrocodone Itching and Nausea And Vomiting    Patient Measurements: Height: 5\' 2"  (157.5 cm) Weight: 112.9 kg (248 lb 14.4 oz) IBW/kg (Calculated) : 50.1  Vital Signs: Temp: 97.6 F (36.4 C) (10/02 1141) Temp Source: Oral (10/02 1141) BP: 133/52 (10/02 1504) Pulse Rate: 75 (10/02 1504)  Labs: Recent Labs    03/02/20 0437 03/02/20 0844 03/02/20 0844 03/02/20 1533 03/03/20 0030 03/03/20 0211 03/03/20 0317 03/03/20 1259 03/03/20 1528 03/03/20 1700  HGB  --  9.5*   < >  --   --  8.4* 9.2*  --   --   --   HCT  --  28.0*  --   --   --  27.2* 27.0*  --   --   --   PLT  --   --   --   --   --  63*  --   --   --   --   APTT   < >  --   --   --  38*  --   --  63*  --  73*  CREATININE   < >  --   --  1.66*  --  2.66*  --   --  2.15*  --    < > = values in this interval not displayed.    Estimated Creatinine Clearance: 37.6 mL/min (A) (by C-G formula based on SCr of 2.15 mg/dL (H)).  Assessment: 49 years of age female with COVID pneumonia on CRRT and sepsis requiring vasopressors now with thrombocytopenia. Pharmacy consulted to start Argatroban therapy while checking for possibility of HIT.   Repeat 4 hour aPTT = 73 - therapeutic on Argatroban rate of 0.5 mcg/kg/min.   Confirmed CRRT filter has been changed to M100 -non heparin filter.   Goal of Therapy:  aPTT 50-90 seconds Monitor platelets by anticoagulation protocol: Yes   Plan:  Continue Argatroban therapy at 0.5 mcg/kg/min.  Repeat APTT in 12 hours   Monitor for signs of clot or bleeding.  Follow-up HIT Ab result  Albertina Parr, PharmD., BCPS, BCCCP Clinical Pharmacist Clinical phone for 03/03/20 until 10pm: [phone] If after 10pm, please refer to [facility] for unit-specific pharmacist

## 2020-03-03 NOTE — Progress Notes (Signed)
Episode of emesis during bath. Tubefeed and insulin infusion on hold. E-link MD made aware.   Antonietta Breach, RN

## 2020-03-03 NOTE — Progress Notes (Signed)
ANTICOAGULATION CONSULT NOTE   Pharmacy Consult for Argatroban Indication: Possible HIT  Allergies  Allergen Reactions   Heparin Other (See Comments)    Possible HIT - HIT Ab is pending   Hydrocodone Itching and Nausea And Vomiting    Patient Measurements: Height: 5\' 2"  (157.5 cm) Weight: 112.9 kg (248 lb 14.4 oz) IBW/kg (Calculated) : 50.1  Vital Signs: Temp: 97.6 F (36.4 C) (10/02 1141) Temp Source: Oral (10/02 1141) BP: 144/76 (10/02 0818) Pulse Rate: 80 (10/02 1215)  Labs: Recent Labs    03/01/20 0926 03/02/20 0437 03/02/20 0844 03/02/20 0844 03/02/20 1533 03/03/20 0030 03/03/20 0211 03/03/20 0317 03/03/20 1259  HGB   < >  --  9.5*   < >  --   --  8.4* 9.2*  --   HCT   < >  --  28.0*  --   --   --  27.2* 27.0*  --   PLT  --   --   --   --   --   --  63*  --   --   APTT  --  33  --   --   --  38*  --   --  63*  CREATININE  --  1.86*  --   --  1.66*  --  2.66*  --   --    < > = values in this interval not displayed.    Estimated Creatinine Clearance: 30.4 mL/min (A) (by C-G formula based on SCr of 2.66 mg/dL (H)).  Assessment: 49 years of age female with COVID pneumonia on CRRT and sepsis requiring vasopressors now with thrombocytopenia. Pharmacy consulted to start Argatroban therapy while checking for possibility of HIT.   Initial 4 hour aPTT = 63 - therapeutic on Argatroban rate of 0.5 mcg/kg/min.   Confirmed CRRT filter has been changed to M100 -non heparin filter.   Goal of Therapy:  aPTT 50-90 seconds Monitor platelets by anticoagulation protocol: Yes   Plan:  Continue Argatroban therapy at 0.5 mcg/kg/min.  Repeat APTT in 4 hours - if remains therapeutic then go to q12 hours once therapeutic while in ICU.  Monitor for signs of clot or bleeding.  Follow-up HIT Ab result  Sloan Leiter, PharmD, BCPS, BCCCP Clinical Pharmacist Please refer to [facility] for [place] numbers 03/03/2020,2:47 PM

## 2020-03-03 NOTE — Progress Notes (Signed)
NAME:  Wanda Ramirez, MRN:  [id number], DOB:  1971-05-06, LOS: 55 ADMISSION DATE:  02/13/2020, CONSULTATION DATE: 02/24/2020 REFERRING MD: Dr. Heber Blanford, CHIEF COMPLAINT: Increasing supplemental oxygen demand  Brief History   Saleena Tamas is a 49 year old female with a past medical history significant for end-stage renal disease on hemodialysis MWF, chronic hypoxic respiratory failure on 3 L nasal cannula at baseline, sleep apnea, diabetes, hypertension, hyperlipidemia, hypothyroidism, and asthma who presented to the emergency department with complaints of worsening shortness of breath, dry cough, generalized body aches, and loose stools.  Patient was diagnosed 9/9 with Covid pneumonia.  Initially she was able to maintain outpatient status but when symptoms worsened 9/14 she presented to the emergency department.  Patient reported dyspnea and weakness worsened to the point where she was unable to ambulate effectively.  She also reports she was unable to maintain adequate oral intake.  Patient received the moderna vaccines in March and April of this year.  Patient presented initially febrile with a temperature of 102.9 and mildly tachypneic with all other vital signs within normal limits.  She was initially managed on 4 L nasal cannula.  Lab work significant for NA 130, K3.8, glucose 454, anion gap 21, lactic acid 1.8, LDH 296, ferritin 6690, CRP 13.3, and procalcitonin 10.51  On review of medical record it appears patient has progressively required increased supplemental oxygen requirement.  Afternoon of 9/24 patient had episode of desaturation requiring further increase in supplemental oxygen.  On chart assessment it appears patient has been placed on 40 L with reported increased work of breathing prompting PCCM consultation  Past Medical History  Sleep apnea Morbid obesity Hypothyroidism Hypertension GERD  End-stage renal disease on HD MWF Diabetes Asthma   Immunization History   Administered Date(s) Administered  . Influenza,inj,Quad PF,6+ Mos 02/09/2018     Significant Hospital Events   Admitted 9/14  9/24 - Lying in bed on side currently undergoing iHD,  She is lethargic but will arouse to verbal stimuli but quickly falls back to sleep.  RN states patient has had progressive decline over the last 12hrs  9/25 - -currently in medical ICU to [facility].  Bed 12.  She has been on BiPAP with Precedex overnight.  This morning on the low-dose of Precedex she got agitated.?  Also had melena.  She also desaturated.  Nursing then increase her Precedex and since then she has been more stable.  Yesterday dialysis was cut short.   9/26 -unable to run CRRT with prone ventilation.  Resume supine ventilation but still unable to draw blood back.  9/27 transferred to 59m. IR replaced HD catheter  10/1 on CRRT and vasopressors  Consults:  PCCM  Procedures:  9/23 IR guided tunneled HD cath placement - left subclavian  Significant Diagnostic Tests:  Pulmonary VQ scan at 9/21 > negative  Micro Data:  COVID 9/14 >  Blood cultures 9/13 > negative  Antimicrobials:  Cefepime 9/24 > Azithromycin 9/21 > 9/26 Remdesivir completed 9/17 > 9/19 Tocilizumab x1  Interim history/subjective:  Currently tolerating CRRT, still requiring vasopressor support with norepinephrine and vasopressin.  Patient's platelet counts are trending down there is a concern of HIT  Objective   Blood pressure (!) 98/43, pulse 94, temperature 97.7 F (36.5 C), temperature source Oral, resp. rate (!) 32, height 5\' 2"  (1.575 m), weight 112.9 kg, SpO2 100 %.    Vent Mode: PRVC FiO2 (%):  [40 %-100 %] 50 % Set Rate:  [32 bmp] 32 bmp Vt Set:  [  400 mL] 400 mL PEEP:  [16 cmH20] 16 cmH20 Plateau Pressure:  [37 cmH20-41 cmH20] 37 cmH20   Intake/Output Summary (Last 24 hours) at 03/03/2020 1020 Last data filed at 03/03/2020 1000 Gross per 24 hour  Intake 3524.74 ml  Output 2967 ml  Net 557.74 ml    Filed Weights   03/01/20 0500 03/02/20 0500 03/03/20 0500  Weight: 115.1 kg 112.7 kg 112.9 kg     Physical exam: General: Chronically ill-appearing female, orally intubated HEAENT: [place]/AT, eyes anicteric.  ETT and OGT in place Neuro: Sedated, not following commands.  Eyes are closed.  Pupils 3 mm bilateral reactive to light Chest: Bilateral crackles at the bases, no wheezes or rhonchi Heart: Regular rate and rhythm, no murmurs or gallops Abdomen: Soft, nontender, nondistended, bowel sounds present Skin: No rash   Resolved Hospital Problem list     Assessment & Plan:   Acute hypoxic/hypercapnic respiratory failure due to ARDS secondary to COVID 19 Pneumonia Patient's P to F ratio is less than 150 but unable to tolerate prone ventilation due to hemodynamic instability and difficulty tolerating CRRT due to catheter flow Continue lung protective mechanical ventilation per ARDS protocol Patient's plateau pressure and driving pressures are elevated, she is hypercapnic at baseline with pH of 7.2, unable to decrease tidal volume Target PaO2 55-80: titrate PEEP/FiO2 per protocol Patient has completed steroid, remdesivir, immunomodulating therapy.  Mixed metabolic and respiratory acidosis Patient has been on CRRT, ventilator setting is at max limited by high peak and plateau pressures Monitor ABGs  ESRD on HD  On CRRT, tolerating well  Acute on chronic Anemia likely component of chronic disease with blood loss no obvious signs of bleeding s/p 2 u prbc on 9/28  Shock, undifferentiated Patient's echocardiogram is normal, cultures have been negative This could be viral induced septic shock Patient is still requiring vasopressors to maintain MAP > 65 We will stop vasopressin today, continue Levophed She is off antibiotics now  Poorly controlled diabetes with hyperglycemia Patient's fingersticks still running high over the goal Insulin drip was stopped and she was transitioned to  long-acting insulin and tube feed coverage  Best practice:  Diet:NPO Pain/Anxiety/Delirium protocol (if indicated): PRN VAP protocol (if indicated): N/A DVT prophylaxis: Subq heparin GI prophylaxis: PPI Glucose control: Insulin infusion Mobility: Bedrest Code Status: Full Family Communication: I will update family over the phone today Disposition: continue ICU care  LABS    PULMONARY Recent Labs  Lab 02/28/20 0816 02/29/20 1110 03/01/20 0926 03/02/20 0844 03/03/20 0317  PHART 7.437 7.276* 7.329* 7.304* 7.235*  PCO2ART 47.2 55.9* 52.9* 50.9* 56.9*  PO2ART 49* 76* 55* 52* 74*  HCO3 32.7* 26.0 28.0 26.1 23.8  TCO2 34* 28 30 28 25   O2SAT 89.0 93.0 86.0 87.0 90.0    CBC Recent Labs  Lab 02/28/20 0726 02/28/20 0816 02/29/20 0430 02/29/20 1110 03/02/20 0844 03/03/20 0211 03/03/20 0317  HGB 5.6*   < > 8.8*   < > 9.5* 8.4* 9.2*  HCT 18.1*   < > 26.8*   < > 28.0* 27.2* 27.0*  WBC 26.3*  --  26.9*  --   --  24.3*  --   PLT 62*  --  75*  --   --  63*  --    < > = values in this interval not displayed.    COAGULATION Recent Labs  Lab 02/26/20 0709  INR 1.3*    CARDIAC  No results for input(s): TROPONINI in the last 168 hours. No results for  input(s): PROBNP in the last 168 hours.   CHEMISTRY Recent Labs  Lab 02/28/20 0435 02/28/20 0816 02/29/20 0430 02/29/20 1110 03/01/20 0334 03/01/20 0926 03/01/20 1507 03/01/20 1507 03/02/20 0437 03/02/20 0437 03/02/20 0844 03/02/20 0844 03/02/20 1533 03/02/20 1533 03/03/20 0211 03/03/20 0317  NA 136   < > 136   < > 136   < > 134*   < > 133*  --  137  --  137  --  137 136  K 4.6   < > 4.8   < > 4.3   < > 4.4   < > 4.4   < > 3.8   < > 3.9   < > 4.9 4.6  CL 94*   < > 98   < > 99  --  99  --  100  --   --   --  102  --  103  --   CO2 26   < > 24   < > 24  --  24  --  23  --   --   --  24  --  22  --   GLUCOSE 281*   < > 387*   < > 254*  --  245*  --  427*  --   --   --  198*  --  234*  --   BUN 55*   < > 35*   <  > 29*  --  24*  --  30*  --   --   --  28*  --  38*  --   CREATININE 6.38*   < > 3.17*   < > 1.83*  --  1.79*  --  1.86*  --   --   --  1.66*  --  2.66*  --   CALCIUM 8.2*   < > 8.5*   < > 8.5*  --  8.8*  --  8.8*  --   --   --  8.7*  --  8.9  --   MG 2.3  --  2.9*  --  2.8*  --   --   --  3.0*  --   --   --   --   --  3.1*  --   PHOS 8.3*   < > 5.0*   < > 3.8  --  4.1  --  4.0  --   --   --  3.9  --  5.3*  --    < > = values in this interval not displayed.   Estimated Creatinine Clearance: 30.4 mL/min (A) (by C-G formula based on SCr of 2.66 mg/dL (H)).   LIVER Recent Labs  Lab 02/26/20 0709 02/26/20 1611 02/27/20 0514 02/27/20 1527 02/29/20 1539 03/01/20 0334 03/01/20 1507 03/02/20 0437 03/02/20 1533  AST 43*  --  91*  --   --   --   --   --   --   ALT 7  --  17  --   --   --   --   --   --   ALKPHOS 448*  --  350*  --   --   --   --   --   --   BILITOT 0.8  --  0.8  --   --   --   --   --   --   PROT 5.8*  --  5.4*  --   --   --   --   --   --  ALBUMIN 2.2*   < > 1.9*  1.9*   < > 1.9* 1.9* 2.0* 2.0* 2.1*  INR 1.3*  --   --   --   --   --   --   --   --    < > = values in this interval not displayed.     INFECTIOUS Recent Labs  Lab 02/26/20 0709 02/27/20 0514  LATICACIDVEN 1.7 1.6     ENDOCRINE CBG (last 3)  Recent Labs    03/03/20 0218 03/03/20 0401 03/03/20 0731  GLUCAP 234* 267* 239*       IMAGING x48h  - image(s) personally visualized  -   highlighted in bold No results found.   Total critical care time: 42 minutes  Performed by: [place] care time was exclusive of separately billable procedures and treating other patients.   Critical care was necessary to treat or prevent imminent or life-threatening deterioration.   Critical care was time spent personally by me on the following activities: development of treatment plan with patient and/or surrogate as well as nursing, discussions with consultants, evaluation of patient's  response to treatment, examination of patient, obtaining history from patient or surrogate, ordering and performing treatments and interventions, ordering and review of laboratory studies, ordering and review of radiographic studies, pulse oximetry and re-evaluation of patient's condition.   Jacky Kindle MD Critical care physician [facility]  Pager: [phone] Mobile: [phone]

## 2020-03-04 LAB — POCT I-STAT 7, (LYTES, BLD GAS, ICA,H+H)
Acid-base deficit: 1 mmol/L (ref 0.0–2.0)
Bicarbonate: 23.7 mmol/L (ref 20.0–28.0)
Calcium, Ion: 1.24 mmol/L (ref 1.15–1.40)
HCT: 21 % — ABNORMAL LOW (ref 36.0–46.0)
Hemoglobin: 7.1 g/dL — ABNORMAL LOW (ref 12.0–15.0)
O2 Saturation: 95 %
Potassium: 3.8 mmol/L (ref 3.5–5.1)
Sodium: 135 mmol/L (ref 135–145)
TCO2: 25 mmol/L (ref 22–32)
pCO2 arterial: 37.5 mmHg (ref 32.0–48.0)
pH, Arterial: 7.408 (ref 7.350–7.450)
pO2, Arterial: 76 mmHg — ABNORMAL LOW (ref 83.0–108.0)

## 2020-03-04 LAB — RENAL FUNCTION PANEL
Albumin: 1.8 g/dL — ABNORMAL LOW (ref 3.5–5.0)
Anion gap: 8 (ref 5–15)
BUN: 28 mg/dL — ABNORMAL HIGH (ref 6–20)
CO2: 25 mmol/L (ref 22–32)
Calcium: 8.5 mg/dL — ABNORMAL LOW (ref 8.9–10.3)
Chloride: 103 mmol/L (ref 98–111)
Creatinine, Ser: 2 mg/dL — ABNORMAL HIGH (ref 0.44–1.00)
GFR calc Af Amer: 33 mL/min — ABNORMAL LOW (ref >60)
GFR calc non Af Amer: 29 mL/min — ABNORMAL LOW (ref >60)
Glucose, Bld: 223 mg/dL — ABNORMAL HIGH (ref 70–99)
Phosphorus: 3.3 mg/dL (ref 2.5–4.6)
Potassium: 3.6 mmol/L (ref 3.5–5.1)
Sodium: 136 mmol/L (ref 135–145)

## 2020-03-04 LAB — CBC
HCT: 25.1 % — ABNORMAL LOW (ref 36.0–46.0)
Hemoglobin: 8 g/dL — ABNORMAL LOW (ref 12.0–15.0)
MCH: 30.5 pg (ref 26.0–34.0)
MCHC: 31.9 g/dL (ref 30.0–36.0)
MCV: 95.8 fL (ref 80.0–100.0)
Platelets: 59 10*3/uL — ABNORMAL LOW (ref 150–400)
RBC: 2.62 MIL/uL — ABNORMAL LOW (ref 3.87–5.11)
RDW: 21.6 % — ABNORMAL HIGH (ref 11.5–15.5)
WBC: 14.3 10*3/uL — ABNORMAL HIGH (ref 4.0–10.5)
nRBC: 18.5 % — ABNORMAL HIGH (ref 0.0–0.2)

## 2020-03-04 LAB — GLUCOSE, CAPILLARY
Glucose-Capillary: 166 mg/dL — ABNORMAL HIGH (ref 70–99)
Glucose-Capillary: 218 mg/dL — ABNORMAL HIGH (ref 70–99)
Glucose-Capillary: 245 mg/dL — ABNORMAL HIGH (ref 70–99)
Glucose-Capillary: 196 mg/dL — ABNORMAL HIGH (ref 70–99)
Glucose-Capillary: 193 mg/dL — ABNORMAL HIGH (ref 70–99)

## 2020-03-04 LAB — BASIC METABOLIC PANEL
Anion gap: 12 (ref 5–15)
BUN: 29 mg/dL — ABNORMAL HIGH (ref 6–20)
CO2: 23 mmol/L (ref 22–32)
Calcium: 8.6 mg/dL — ABNORMAL LOW (ref 8.9–10.3)
Chloride: 101 mmol/L (ref 98–111)
Creatinine, Ser: 1.9 mg/dL — ABNORMAL HIGH (ref 0.44–1.00)
GFR calc Af Amer: 35 mL/min — ABNORMAL LOW (ref >60)
GFR calc non Af Amer: 30 mL/min — ABNORMAL LOW (ref >60)
Glucose, Bld: 160 mg/dL — ABNORMAL HIGH (ref 70–99)
Potassium: 3.8 mmol/L (ref 3.5–5.1)
Sodium: 136 mmol/L (ref 135–145)

## 2020-03-04 LAB — APTT
aPTT: 52 seconds — ABNORMAL HIGH (ref 24–36)
aPTT: 62 seconds — ABNORMAL HIGH (ref 24–36)

## 2020-03-04 LAB — HEPARIN INDUCED PLATELET AB (HIT ANTIBODY): Heparin Induced Plt Ab: 0.232 OD (ref 0.000–0.400)

## 2020-03-04 MED ORDER — PRISMASOL BGK 4/2.5 32-4-2.5 MEQ/L IV SOLN
INTRAVENOUS | Status: DC
  Filled 2020-03-04 (×47): qty 5000

## 2020-03-04 MED ORDER — INSULIN ASPART 100 UNIT/ML ~~LOC~~ SOLN
5.0000 [IU] | SUBCUTANEOUS | Status: DC
  Administered 2020-03-04 – 2020-03-09 (×17): 5 [IU] via SUBCUTANEOUS

## 2020-03-04 NOTE — Progress Notes (Signed)
NAME:  Wanda Ramirez, MRN:  [id number], DOB:  [birth year], LOS: 45 ADMISSION DATE:  02/13/2020, CONSULTATION DATE: 02/24/2020 REFERRING MD: Dr. Heber [place], CHIEF COMPLAINT: Increasing supplemental oxygen demand  Brief History   Wanda Ramirez is a 49 year old female with a past medical history significant for end-stage renal disease on hemodialysis MWF, chronic hypoxic respiratory failure on 3 L nasal cannula at baseline, sleep apnea, diabetes, hypertension, hyperlipidemia, hypothyroidism, and asthma who presented to the emergency department with complaints of worsening shortness of breath, dry cough, generalized body aches, and loose stools.  Patient was diagnosed 9/9 with Covid pneumonia.  Initially she was able to maintain outpatient status but when symptoms worsened 9/14 she presented to the emergency department.  Patient reported dyspnea and weakness worsened to the point where she was unable to ambulate effectively.  She also reports she was unable to maintain adequate oral intake.  Patient received the moderna vaccines in March and April of this year.  Patient presented initially febrile with a temperature of 102.9 and mildly tachypneic with all other vital signs within normal limits.  She was initially managed on 4 L nasal cannula.  Lab work significant for NA 130, K3.8, glucose 454, anion gap 21, lactic acid 1.8, LDH 296, ferritin 6690, CRP 13.3, and procalcitonin 10.51  On review of medical record it appears patient has progressively required increased supplemental oxygen requirement.  Afternoon of 9/24 patient had episode of desaturation requiring further increase in supplemental oxygen.  On chart assessment it appears patient has been placed on 40 L with reported increased work of breathing prompting PCCM consultation  Past Medical History  Sleep apnea Morbid obesity Hypothyroidism Hypertension GERD  End-stage renal disease on HD MWF Diabetes Asthma   Immunization History   Administered Date(s) Administered  . Influenza,inj,Quad PF,6+ Mos 02/09/2018     Significant Hospital Events   Admitted 9/14  9/24 - Lying in bed on side currently undergoing iHD,  She is lethargic but will arouse to verbal stimuli but quickly falls back to sleep.  RN states patient has had progressive decline over the last 12hrs  9/25 - -currently in medical ICU to [facility].  Bed 12.  She has been on BiPAP with Precedex overnight.  This morning on the low-dose of Precedex she got agitated.?  Also had melena.  She also desaturated.  Nursing then increase her Precedex and since then she has been more stable.  Yesterday dialysis was cut short.   9/26 -unable to run CRRT with prone ventilation.  Resume supine ventilation but still unable to draw blood back.  9/27 transferred to 73m. IR replaced HD catheter  10/1 on CRRT and vasopressors  Consults:  PCCM  Procedures:  9/23 IR guided tunneled HD cath placement - left subclavian  Significant Diagnostic Tests:  Pulmonary VQ scan at 9/21 > negative  Micro Data:  COVID 9/14 >  Blood cultures 9/13 > negative  Antimicrobials:  Cefepime 9/24 > Azithromycin 9/21 > 9/26 Remdesivir completed 9/17 > 9/19 Tocilizumab x1  Interim history/subjective:  Patient's vasopressor requirement is coming down, will start pulling of fluid through CRRT, ABG showed improvement in hypercapnia and hypoxia  Objective   Blood pressure (!) 119/48, pulse (!) 101, temperature 98.4 F (36.9 C), resp. rate (!) 34, height 5\' 2"  (1.575 m), weight 109.2 kg, SpO2 100 %.    Vent Mode: PRVC FiO2 (%):  [40 %-50 %] 40 % Set Rate:  [32 bmp] 32 bmp Vt Set:  [400 mL] 400 mL PEEP:  [  [place] Pressure:  [27 [id number] cmH20] 27 cmH20   Intake/Output Summary (Last 24 hours) at 03/04/2020 1123 Last data filed at 03/04/2020 1000 Gross per 24 hour  Intake 2076.81 ml  Output 2132 ml  Net -55.19 ml   Filed Weights   03/02/20 0500 03/03/20 0500  03/04/20 0500  Weight: 112.7 kg 112.9 kg 109.2 kg     Physical exam: General: Chronically ill-appearing female, orally intubated HEAENT: [place]/AT, eyes anicteric.  ETT and OGT in place Neuro: Sedated, not following commands.  Eyes are closed.  Pupils 3 mm bilateral reactive to light Chest: Bilateral coarse crackles at the bases, no wheezes or rhonchi Heart: Regular rate and rhythm, no murmurs or gallops Abdomen: Soft, nontender, nondistended, bowel sounds present Skin: No rash   Resolved Hospital Problem list   Mixed metabolic/respiratory acidosis  Assessment & Plan:   Acute hypoxic/hypercapnic respiratory failure due to ARDS secondary to COVID 19 Pneumonia Patient's P to F ratio has improved >150 Currently on FiO2 of 40% and PEEP of 8 Repeat ABG showed pH of 7.4 and PCO2 37 and PO2 76 Continue lung protective mechanical ventilation per ARDS protocol Peak, plateau and driveway pressures are at goal Target PaO2 55-80: titrate PEEP/FiO2 per protocol Patient has completed steroid, remdesivir, immunomodulating therapy.   ESRD on HD  On CRRT, tolerating well Will start pulling fluid 50 cc an hour  Acute on chronic Anemia likely component of chronic disease with blood loss no obvious signs of bleeding s/p 2 u prbc on 9/28  Shock, undifferentiated, improving Patient's echocardiogram is normal, cultures have been negative This could be viral induced septic shock Patient's vasopressor requirement is improving, currently on 4 mics of Levophed, vasopressin was stopped She is off antibiotics  Poorly controlled diabetes with hyperglycemia Patient's fingersticks still running high over the goal Increase long-acting insulin and sliding scale coverage  Best practice:  Diet:NPO Pain/Anxiety/Delirium protocol (if indicated): PRN VAP protocol (if indicated): N/A DVT prophylaxis: Subq heparin GI prophylaxis: PPI Glucose control: Insulin infusion Mobility: Bedrest Code Status:  Full Family Communication: Patient's family will be updated over the phone Disposition: continue ICU care  LABS    PULMONARY Recent Labs  Lab 02/29/20 1110 03/01/20 0926 03/02/20 0844 03/03/20 0317 03/04/20 0927  PHART 7.276* 7.329* 7.304* 7.235* 7.408  PCO2ART 55.9* 52.9* 50.9* 56.9* 37.5  PO2ART 76* 55* 52* 74* 76*  HCO3 26.0 28.0 26.1 23.8 23.7  TCO2 28 30 28 25 25   O2SAT 93.0 86.0 87.0 90.0 95.0    CBC Recent Labs  Lab 02/29/20 0430 02/29/20 1110 03/03/20 0211 03/03/20 0211 03/03/20 0317 03/04/20 0356 03/04/20 0927  HGB 8.8*   < > 8.4*   < > 9.2* 8.0* 7.1*  HCT 26.8*   < > 27.2*   < > 27.0* 25.1* 21.0*  WBC 26.9*  --  24.3*  --   --  14.3*  --   PLT 75*  --  63*  --   --  59*  --    < > = values in this interval not displayed.    COAGULATION No results for input(s): INR in the last 168 hours.  CARDIAC  No results for input(s): TROPONINI in the last 168 hours. No results for input(s): PROBNP in the last 168 hours.   CHEMISTRY Recent Labs  Lab 02/28/20 0435 02/28/20 4967 02/29/20 0430 02/29/20 1110 03/01/20 5916 03/01/20 3846 03/01/20 1507 03/01/20 1507 03/02/20 6599 03/02/20 3570 03/02/20 1533 03/02/20 1533 03/03/20 0211 03/03/20 0211  03/03/20 0317 03/03/20 0317 03/03/20 1528 03/03/20 1528 03/04/20 0356 03/04/20 0927  NA 136   < > 136   < > 136   < > 134*   < > 133*   < > 137   < > 137  --  136  --  136  --  136 135  K 4.6   < > 4.8   < > 4.3   < > 4.4   < > 4.4   < > 3.9   < > 4.9   < > 4.6   < > 3.6   < > 3.8 3.8  CL 94*   < > 98   < > 99  --  99   < > 100  --  102  --  103  --   --   --  104  --  101  --   CO2 26   < > 24   < > 24  --  24   < > 23  --  24  --  22  --   --   --  23  --  23  --   GLUCOSE 281*   < > 387*   < > 254*  --  245*   < > 427*  --  198*  --  234*  --   --   --  204*  --  160*  --   BUN 55*   < > 35*   < > 29*  --  24*   < > 30*  --  28*  --  38*  --   --   --  33*  --  29*  --   CREATININE 6.38*   < > 3.17*   <  > 1.83*  --  1.79*   < > 1.86*  --  1.66*  --  2.66*  --   --   --  2.15*  --  1.90*  --   CALCIUM 8.2*   < > 8.5*   < > 8.5*  --  8.8*   < > 8.8*  --  8.7*  --  8.9  --   --   --  8.7*  --  8.6*  --   MG 2.3  --  2.9*  --  2.8*  --   --   --  3.0*  --   --   --  3.1*  --   --   --   --   --   --   --   PHOS 8.3*   < > 5.0*   < > 3.8  --  4.1  --  4.0  --  3.9  --  5.3*  --   --   --  3.6  --   --   --    < > = values in this interval not displayed.   Estimated Creatinine Clearance: 41.7 mL/min (A) (by C-G formula based on SCr of 1.9 mg/dL (H)).   LIVER Recent Labs  Lab 02/27/20 0514 02/27/20 1527 03/01/20 0334 03/01/20 1507 03/02/20 0437 03/02/20 1533 03/03/20 1528  AST 91*  --   --   --   --   --   --   ALT 17  --   --   --   --   --   --   ALKPHOS 350*  --   --   --   --   --   --  BILITOT 0.8  --   --   --   --   --   --   PROT 5.4*  --   --   --   --   --   --   ALBUMIN 1.9*  1.9*   < > 1.9* 2.0* 2.0* 2.1* 1.9*   < > = values in this interval not displayed.     INFECTIOUS Recent Labs  Lab 02/27/20 0514  LATICACIDVEN 1.6     ENDOCRINE CBG (last 3)  Recent Labs    03/03/20 2309 03/04/20 0400 03/04/20 0812  GLUCAP 146* 166* 218*       IMAGING x48h  - image(s) personally visualized  -   highlighted in bold No results found.   Total critical care time: 37 minutes  Performed by: [place] care time was exclusive of separately billable procedures and treating other patients.   Critical care was necessary to treat or prevent imminent or life-threatening deterioration.   Critical care was time spent personally by me on the following activities: development of treatment plan with patient and/or surrogate as well as nursing, discussions with consultants, evaluation of patient's response to treatment, examination of patient, obtaining history from patient or surrogate, ordering and performing treatments and interventions, ordering and review of  laboratory studies, ordering and review of radiographic studies, pulse oximetry and re-evaluation of patient's condition.   Jacky Kindle MD Critical care physician [facility]  Pager: [phone] Mobile: [phone]

## 2020-03-04 NOTE — Progress Notes (Signed)
[facility] Progress Note  Subjective:    Seen and examined on CRRT.   Noted argatroban started.  She had 1.9 liters UF with CRRT over 10/2.  Goal of keep even as tolerated.  On levo at 6 mcg/min.  She is off of vaso and off of paralytic. Spoke with bedside RN.  Review of systems    Unable to obtain 2/2 intubated, sedated    Vitals:   03/04/20 0258 03/04/20 0300 03/04/20 0400 03/04/20 0500  BP: (!) 119/48     Pulse: (!) 103 (!) 102 (!) 107 91  Resp: (!) 36 (!) 32 (!) 31 (!) 32  Temp:  99 F (37.2 C) 99.3 F (37.4 C)   TempSrc:   Rectal   SpO2: 100% 100% 100% 93%  Weight:    109.2 kg  Height:        Exam: gen adult female critically ill intubated   Chest coarse breath sounds   FIO2 50 and PEEP 8  tachy S1S2 tachycardic  Abd soft obese habitus on sedation cannot assess tenderness   Ext no LE edema    LUA AVF no b/t; left chest tunneled dialysis catheter Neuro - on Sedation continuously  OP HD: TTS  4h 2mn 450/800  2/2.25 bath  111.5kg  Hep 2000  L AVF  - darbe 50 ug q week, last 9/7  - calc 1.0 tiw  - 9/9 Hb 10.0, tsat 22%  Assessment/ Plan: # COVID pna w/ acute on chronic hypoxic respiratory failure:  dexamethasone, status post tocilizumab and remdesivir.  Severe CXR changes.    # Acute hypoxic resp failure  - mechanical ventilation per primary team   # ESRD: TTS HD.  Continue CRRT.  Change back to 4K dialysate and keep other fluids 4K for now.  UF goal keep even to net neg 50 ml/hr as tolerated.  Follow PM CRRT labs. As plt dropped stopped any heparin with CRRT  # Clotted LUA AVF: note TDC placed by IR 9/11. Will need new perm access at some point when pt is more stable.  Tunneled catheter exchanged on 9/27 with IR  # Septic shock - 2/2 covid. on pressors per pulm  # Volume status:  hypotension; per charting wt's are below dry with attempts to optimize resp status    # Anemia of Chronic Kidney Disease: aranesp increased to 150 mcg for 9/28 and  onward for now  # Secondary Hyperparathyroidism/Hyperphosphatemia:  while on CRRT discontinued Auryxia and temporarily discontinued Sensipar 60 mg every other day  #DM2 - management per primary team   Recent Labs  Lab 03/03/20 0211 03/03/20 0211 03/03/20 0317 03/03/20 1528 03/04/20 0356  K 4.9   < > 4.6 3.6 3.8  BUN 38*   < >  --  33* 29*  CREATININE 2.66*   < >  --  2.15* 1.90*  CALCIUM 8.9   < >  --  8.7* 8.6*  PHOS 5.3*  --   --  3.6  --   HGB 8.4*   < > 9.2*  --  8.0*   < > = values in this interval not displayed.   Inpatient medications: . artificial tears  1 application Both Eyes QP9Y . B-complex with vitamin C  1 tablet Per Tube Daily  . chlorhexidine gluconate (MEDLINE KIT)  15 mL Mouth Rinse BID  . Chlorhexidine Gluconate Cloth  6 each Topical Q0600  . darbepoetin (ARANESP) injection - DIALYSIS  150 mcg Intravenous Q Tue-HD  .  docusate  100 mg Per Tube BID  . feeding supplement (PROSource TF)  45 mL Per Tube QID  . insulin aspart  3 Units Subcutaneous Q4H  . insulin aspart  3-9 Units Subcutaneous Q4H  . insulin detemir  10 Units Subcutaneous Q12H  . levothyroxine  125 mcg Per Tube Q0600  . mouth rinse  15 mL Mouth Rinse 10 times per day  . pantoprazole  40 mg Intravenous Q12H  . polyethylene glycol  17 g Per Tube BID  . sodium chloride flush  10-40 mL Intracatheter Q12H  . sodium chloride flush  3 mL Intravenous Q12H   .  prismasol BGK 4/2.5 500 mL/hr at 03/03/20 2306  .  prismasol BGK 4/2.5 300 mL/hr at 03/03/20 1954  . sodium chloride 10 mL/hr at 03/04/20 0500  . sodium chloride    . argatroban 0.5 mcg/kg/min (03/04/20 0500)  . dextrose    . feeding supplement (VITAL 1.5 CAL) 20 mL/hr at 03/04/20 0500  . fentaNYL infusion INTRAVENOUS 300 mcg/hr (03/04/20 0500)  . midazolam 10 mg/hr (03/04/20 0500)  . norepinephrine (LEVOPHED) Adult infusion 9 mcg/min (03/04/20 0500)  . prismasol BGK 2/2.5 dialysis solution 2,000 mL/hr at 03/04/20 0522  . vasopressin  Stopped (03/03/20 2051)   sodium chloride, sodium chloride, acetaminophen (TYLENOL) oral liquid 160 mg/5 mL, albuterol, dextrose, fentaNYL, fentaNYL (SUBLIMAZE) injection, fentaNYL (SUBLIMAZE) injection, heparin, heparin sodium (porcine), influenza vac split quadrivalent PF, lip balm, midazolam, midazolam, midazolam, sodium chloride flush, sodium chloride flush    Claudia Desanctis, MD 03/04/2020 6:34 AM

## 2020-03-04 NOTE — Progress Notes (Signed)
Assisted family with tele-visit via elink 

## 2020-03-04 NOTE — Progress Notes (Signed)
[place] for Argatroban Indication: Possible HIT  Allergies  Allergen Reactions  . Heparin Other (See Comments)    Possible HIT - HIT Ab is pending  . Hydrocodone Itching and Nausea And Vomiting    Patient Measurements: Height: 5\' 2"  (157.5 cm) Weight: 109.2 kg (240 lb 11.9 oz) IBW/kg (Calculated) : 50.1  Vital Signs: Temp: 97.2 F (36.2 C) (10/03 0812) Temp Source: Core (10/03 0812) BP: 119/48 (10/03 0258) Pulse Rate: 97 (10/03 0600)  Labs: Recent Labs    03/03/20 0030 03/03/20 0211 03/03/20 0211 03/03/20 0317 03/03/20 0317 03/03/20 1259 03/03/20 1528 03/03/20 1700 03/04/20 0356 03/04/20 0927  HGB  --  8.4*   < > 9.2*   < >  --   --   --  8.0* 7.1*  HCT  --  27.2*   < > 27.0*  --   --   --   --  25.1* 21.0*  PLT  --  63*  --   --   --   --   --   --  59*  --   APTT   < >  --   --   --   --  63*  --  73* 52*  --   CREATININE  --  2.66*  --   --   --   --  2.15*  --  1.90*  --    < > = values in this interval not displayed.    Estimated Creatinine Clearance: 41.7 mL/min (A) (by C-G formula based on SCr of 1.9 mg/dL (H)).  Assessment: 49 years of age female with COVID pneumonia on CRRT and sepsis requiring vasopressors now with thrombocytopenia. Pharmacy consulted to start Argatroban therapy while checking for possibility of HIT.   Repeat 12 hour aPTT = 52 - therapeutic on Argatroban rate of 0.5 mcg/kg/min, remains therapeutic. Trending down some. Will recheck this PM and increase if needed.   Confirmed CRRT filter has been changed to M100 -non heparin filter.   Goal of Therapy:  aPTT 50-90 seconds Monitor platelets by anticoagulation protocol: Yes   Plan:  Continue Argatroban therapy at 0.5 mcg/kg/min.  Repeat APTT in 12 hours per ICU protocol Monitor for signs of clot or bleeding.  Follow-up HIT Ab result - in process  Sloan Leiter, PharmD, BCPS, BCCCP Clinical Pharmacist Please refer to [facility] for [place]  numbers 03/04/2020, 9:44 AM

## 2020-03-04 NOTE — Progress Notes (Signed)
[place] for Argatroban Indication: Possible HIT  Allergies  Allergen Reactions  . Heparin Other (See Comments)    Possible HIT - HIT Ab is pending  . Hydrocodone Itching and Nausea And Vomiting    Patient Measurements: Height: 5\' 2"  (157.5 cm) Weight: 109.2 kg (240 lb 11.9 oz) IBW/kg (Calculated) : 50.1  Vital Signs: Temp: 97.5 F (36.4 C) (10/03 1600) Temp Source: Core (10/03 0812) BP: 129/60 (10/03 1356) Pulse Rate: 101 (10/03 1500)  Labs: Recent Labs    03/03/20 0211 03/03/20 0211 03/03/20 0317 03/03/20 0317 03/03/20 1259 03/03/20 1528 03/03/20 1700 03/04/20 0356 03/04/20 0927 03/04/20 1605  HGB 8.4*   < > 9.2*   < >  --   --   --  8.0* 7.1*  --   HCT 27.2*   < > 27.0*  --   --   --   --  25.1* 21.0*  --   PLT 63*  --   --   --   --   --   --  59*  --   --   APTT  --   --   --   --    < >  --  73* 52*  --  62*  CREATININE 2.66*   < >  --   --   --  2.15*  --  1.90*  --  2.00*   < > = values in this interval not displayed.    Estimated Creatinine Clearance: 39.6 mL/min (A) (by C-G formula based on SCr of 2 mg/dL (H)).  Assessment: 49 years of age female with COVID pneumonia on CRRT and sepsis requiring vasopressors now with thrombocytopenia. Pharmacy consulted to start Argatroban therapy while checking for possibility of HIT.   12 hour aPTT remains therapeutic on argatroban 0.5 mcg/kg/min, drifted down somewhat this AM but now back at 62 seconds.  HIT Ab OD = 0.232 and so will not order SRA at this time, plts continue to downtrend to 59.    Goal of Therapy:  aPTT 50-90 seconds Monitor platelets by anticoagulation protocol: Yes   Plan:  Continue argatroban gtt at 0.5 mcg/kg/min F/u aPTT with AM labs, consider HIT Ab results and clinical picture for transitioning back to heparin and discontinuing heparin allergy  Bertis Ruddy, PharmD Clinical Pharmacist Please check AMION for all [place] numbers 03/04/2020  4:54 PM

## 2020-03-05 LAB — CBC WITH DIFFERENTIAL/PLATELET
Abs Immature Granulocytes: 0.29 10*3/uL — ABNORMAL HIGH (ref 0.00–0.07)
Basophils Absolute: 0 10*3/uL (ref 0.0–0.1)
Basophils Relative: 0 %
Eosinophils Absolute: 0.1 10*3/uL (ref 0.0–0.5)
Eosinophils Relative: 1 %
HCT: 25.5 % — ABNORMAL LOW (ref 36.0–46.0)
Hemoglobin: 8 g/dL — ABNORMAL LOW (ref 12.0–15.0)
Immature Granulocytes: 3 %
Lymphocytes Relative: 9 %
Lymphs Abs: 1 10*3/uL (ref 0.7–4.0)
MCH: 30.8 pg (ref 26.0–34.0)
MCHC: 31.4 g/dL (ref 30.0–36.0)
MCV: 98.1 fL (ref 80.0–100.0)
Monocytes Absolute: 1.1 10*3/uL — ABNORMAL HIGH (ref 0.1–1.0)
Monocytes Relative: 10 %
Neutro Abs: 9.1 10*3/uL — ABNORMAL HIGH (ref 1.7–7.7)
Neutrophils Relative %: 77 %
Platelets: 56 10*3/uL — ABNORMAL LOW (ref 150–400)
RBC: 2.6 MIL/uL — ABNORMAL LOW (ref 3.87–5.11)
RDW: 23 % — ABNORMAL HIGH (ref 11.5–15.5)
WBC: 11.6 10*3/uL — ABNORMAL HIGH (ref 4.0–10.5)
nRBC: 8 % — ABNORMAL HIGH (ref 0.0–0.2)

## 2020-03-05 LAB — GLUCOSE, CAPILLARY
Glucose-Capillary: 124 mg/dL — ABNORMAL HIGH (ref 70–99)
Glucose-Capillary: 131 mg/dL — ABNORMAL HIGH (ref 70–99)
Glucose-Capillary: 187 mg/dL — ABNORMAL HIGH (ref 70–99)
Glucose-Capillary: 190 mg/dL — ABNORMAL HIGH (ref 70–99)
Glucose-Capillary: 193 mg/dL — ABNORMAL HIGH (ref 70–99)
Glucose-Capillary: 200 mg/dL — ABNORMAL HIGH (ref 70–99)
Glucose-Capillary: 247 mg/dL — ABNORMAL HIGH (ref 70–99)

## 2020-03-05 LAB — RENAL FUNCTION PANEL
Albumin: 2 g/dL — ABNORMAL LOW (ref 3.5–5.0)
Anion gap: 10 (ref 5–15)
BUN: 31 mg/dL — ABNORMAL HIGH (ref 6–20)
CO2: 22 mmol/L (ref 22–32)
Calcium: 8.6 mg/dL — ABNORMAL LOW (ref 8.9–10.3)
Chloride: 102 mmol/L (ref 98–111)
Creatinine, Ser: 2 mg/dL — ABNORMAL HIGH (ref 0.44–1.00)
GFR calc Af Amer: 33 mL/min — ABNORMAL LOW (ref >60)
GFR calc non Af Amer: 29 mL/min — ABNORMAL LOW (ref >60)
Glucose, Bld: 258 mg/dL — ABNORMAL HIGH (ref 70–99)
Phosphorus: 3.6 mg/dL (ref 2.5–4.6)
Potassium: 4.6 mmol/L (ref 3.5–5.1)
Sodium: 134 mmol/L — ABNORMAL LOW (ref 135–145)

## 2020-03-05 LAB — BASIC METABOLIC PANEL
Anion gap: 9 (ref 5–15)
BUN: 27 mg/dL — ABNORMAL HIGH (ref 6–20)
CO2: 24 mmol/L (ref 22–32)
Calcium: 8.9 mg/dL (ref 8.9–10.3)
Chloride: 102 mmol/L (ref 98–111)
Creatinine, Ser: 1.82 mg/dL — ABNORMAL HIGH (ref 0.44–1.00)
GFR calc Af Amer: 37 mL/min — ABNORMAL LOW (ref >60)
GFR calc non Af Amer: 32 mL/min — ABNORMAL LOW (ref >60)
Glucose, Bld: 203 mg/dL — ABNORMAL HIGH (ref 70–99)
Potassium: 4 mmol/L (ref 3.5–5.1)
Sodium: 135 mmol/L (ref 135–145)

## 2020-03-05 LAB — APTT: aPTT: 47 seconds — ABNORMAL HIGH (ref 24–36)

## 2020-03-05 LAB — HEPARIN LEVEL (UNFRACTIONATED): Heparin Unfractionated: 0.45 IU/mL (ref 0.30–0.70)

## 2020-03-05 MED ORDER — HEPARIN (PORCINE) 2000 UNITS/L FOR CRRT: INTRAVENOUS_CENTRAL | Status: DC | PRN

## 2020-03-05 MED ORDER — HEPARIN (PORCINE) 25000 UT/250ML-% IV SOLN
1450.0000 [IU]/h | INTRAVENOUS | Status: DC
  Administered 2020-03-05 – 2020-03-06 (×2): 1250 [IU]/h via INTRAVENOUS
  Administered 2020-03-07: 1450 [IU]/h via INTRAVENOUS
  Administered 2020-03-07: 1200 [IU]/h via INTRAVENOUS
  Filled 2020-03-05 (×4): qty 250

## 2020-03-05 NOTE — Progress Notes (Signed)
ANTICOAGULATION CONSULT NOTE  Pharmacy Consult for heparin Indication: r/o VTE  Heparin Dosing Weight: 78.1 kg  Labs: Recent Labs    03/03/20 0211 03/03/20 0317 03/04/20 0356 03/04/20 0356 03/04/20 0927 03/04/20 1605 03/05/20 0450 03/05/20 1703 03/05/20 1918  HGB 8.4*   < > 8.0*   < > 7.1*  --  8.0*  --   --   HCT 27.2*   < > 25.1*  --  21.0*  --  25.5*  --   --   PLT 63*  --  59*  --   --   --  56*  --   --   APTT  --    < > 52*  --   --  62* 47*  --   --   HEPARINUNFRC  --   --   --   --   --   --   --   --  0.45  CREATININE 2.66*   < > 1.90*   < >  --  2.00* 1.82* 2.00*  --    < > = values in this interval not displayed.    Assessment: 2 yof with ESRD on HD presenting COVID+, admitted to ICU and transitioned to CRRT. HIT ruled out. Patient has been transitioned back to IV Heparin.   Heparin level is therapeutic at 0.45 on current rate of 1250 units/hr.  Last platelet value 56. No bleeding reported.   Goal of Therapy:  Heparin level 0.3-0.7 units/ml Monitor platelets by anticoagulation protocol: Yes   Plan:  Continue heparin at 1250 units/hr Monitor daily heparin level and CBC, s/sx bleeding  Sloan Leiter, PharmD, BCPS, BCCCP Clinical Pharmacist Please refer to [facility] for [place] numbers 03/05/2020 8:36 PM

## 2020-03-05 NOTE — Progress Notes (Signed)
[facility] Progress Note  Subjective:   Patient not examined today directly given COVID-19 + status, utilizing data taken from chart +/- discussions w/ providers and staff.   - on IV argatroban  - off pressors - goal of keep even as tolerated  - Qd 2000 cc/hr, all 4/2.5 fluids   Vitals:   03/05/20 1200 03/05/20 1300 03/05/20 1400 03/05/20 1500  BP:      Pulse:  (!) 109 (!) 111 (!) 116  Resp: 20 (!) 23 (!) 21 (!) 23  Temp: 98.6 F (37 C) 98.4 F (36.9 C) 99 F (37.2 C) 99.5 F (37.5 C)  TempSrc:      SpO2:  99% 96% 99%  Weight:      Height:        Exam:  Patient not examined today directly given COVID-19 + status, utilizing data taken from chart +/- discussions w/ providers and staff.    OP HD: TTS  4h 67mn 450/800  2/2.25 bath  111.5kg  Hep 2000  L AVF  - darbe 50 ug q week, last 9/7  - calc 1.0 tiw  - 9/9 Hb 10.0, tsat 22%  Assessment/ Plan: # COVID pna w/ acute on chronic hypoxic respiratory failure:  dexamethasone, status post tocilizumab and remdesivir.  Severe CXR changes.    # Acute hypoxic resp failure  - mechanical ventilation per primary team   # ESRD: TTS HD.  Continue CRRT.  UF goal 0- 50 ml/hr net neg as tolerated.  Follow PM CRRT labs. Due to low plts any IV hep w/ CRRT was dc'd.   # Clotted LUA AVF: note TDC placed by IR 9/11. Will need new perm access at some point when pt is more stable.  Tunneled catheter exchanged on 9/27 with IR.   # Septic shock - 2/2 covid. Off pressors today.   # Volume status:  hypotension; per charting wt's are below dry with attempts to optimize resp status    # Anemia of Chronic Kidney Disease: aranesp increased to 150 mcg for 9/28 and onward for now  # Secondary Hyperparathyroidism/Hyperphosphatemia:  while on CRRT discontinued Auryxia and temporarily discontinued Sensipar 60 mg every other day  #DM2 - management per primary team  RSol Blazing MD 03/05/2020 3:42 PM       Recent Labs   Lab 03/03/20 1528 03/04/20 0356 03/04/20 0927 03/04/20 1605 03/05/20 0450  K 3.6   < > 3.8 3.6 4.0  BUN 33*   < >  --  28* 27*  CREATININE 2.15*   < >  --  2.00* 1.82*  CALCIUM 8.7*   < >  --  8.5* 8.9  PHOS 3.6  --   --  3.3  --   HGB  --    < > 7.1*  --  8.0*   < > = values in this interval not displayed.   Inpatient medications: . artificial tears  1 application Both Eyes QU2V . B-complex with vitamin C  1 tablet Per Tube Daily  . chlorhexidine gluconate (MEDLINE KIT)  15 mL Mouth Rinse BID  . Chlorhexidine Gluconate Cloth  6 each Topical Q0600  . darbepoetin (ARANESP) injection - DIALYSIS  150 mcg Intravenous Q Tue-HD  . docusate  100 mg Per Tube BID  . feeding supplement (PROSource TF)  45 mL Per Tube QID  . insulin aspart  3-9 Units Subcutaneous Q4H  . insulin aspart  5 Units Subcutaneous Q4H  . insulin detemir  10 Units Subcutaneous Q12H  . levothyroxine  125 mcg Per Tube Q0600  . mouth rinse  15 mL Mouth Rinse 10 times per day  . pantoprazole  40 mg Intravenous Q12H  . polyethylene glycol  17 g Per Tube BID  . sodium chloride flush  10-40 mL Intracatheter Q12H  . sodium chloride flush  3 mL Intravenous Q12H   .  prismasol BGK 4/2.5 500 mL/hr at 03/05/20 1538  .  prismasol BGK 4/2.5 300 mL/hr at 03/05/20 1506  . sodium chloride 10 mL/hr at 03/05/20 0700  . sodium chloride    . dextrose    . feeding supplement (VITAL 1.5 CAL) 1,000 mL (03/05/20 0130)  . fentaNYL infusion INTRAVENOUS 100 mcg/hr (03/05/20 0835)  . heparin 1,250 Units/hr (03/05/20 1226)  . midazolam Stopped (03/04/20 1800)  . norepinephrine (LEVOPHED) Adult infusion Stopped (03/05/20 0007)  . prismasol BGK 4/2.5 2,000 mL/hr at 03/05/20 1507   sodium chloride, sodium chloride, acetaminophen (TYLENOL) oral liquid 160 mg/5 mL, albuterol, dextrose, fentaNYL, fentaNYL (SUBLIMAZE) injection, fentaNYL (SUBLIMAZE) injection, influenza vac split quadrivalent PF, lip balm, midazolam, midazolam, midazolam,  sodium chloride flush, sodium chloride flush        

## 2020-03-05 NOTE — Procedures (Signed)
Cortrak  Person Inserting Tube:  Shajuan Musso M, RD Tube Type:  Cortrak - 43 inches Tube Location:  Right nare Initial Placement:  Stomach Secured by: Bridle Technique Used to Measure Tube Placement:  Documented cm marking at nare/ corner of mouth Cortrak Secured At:  69 cm Procedure Comments:  Cortrak Tube Team Note:  Consult received to place a Cortrak feeding tube.   No x-ray is required. RN may begin using tube.   If the tube becomes dislodged please keep the tube and contact the Cortrak team at [email or web address] (password [email or web address]) for replacement.  If after hours and replacement cannot be delayed, place a NG tube and confirm placement with an abdominal x-ray.      Gisella Alwine, MS, RD, LDN, CNSC Inpatient Clinical Dietitian RD pager # available in AMION  After hours/weekend pager # available in AMION      

## 2020-03-05 NOTE — Progress Notes (Signed)
ANTICOAGULATION CONSULT NOTE  Pharmacy Consult for heparin Indication: r/o VTE  Heparin Dosing Weight: 78.1 kg  Labs: Recent Labs    03/03/20 0211 03/03/20 0317 03/04/20 0356 03/04/20 0356 03/04/20 0927 03/04/20 1605 03/05/20 0450  HGB 8.4*   < > 8.0*   < > 7.1*  --  8.0*  HCT 27.2*   < > 25.1*  --  21.0*  --  25.5*  PLT 63*  --  59*  --   --   --  56*  APTT  --    < > 52*  --   --  62* 47*  CREATININE 2.66*   < > 1.90*  --   --  2.00* 1.82*   < > = values in this interval not displayed.    Assessment: 16 yof with ESRD on HD presenting COVID+, admitted to ICU and transitioned to CRRT. Patient transitioned from heparin SQ/heparin with CRRT to argatroban drip on 9/2 with plt downtrending for r/o HIT. HIT Ab resulted as 0.232, 4T score calculated ~4 >> HIT ruled out. Per discussion with Dr. Tamala Julian, d/c argatroban and start heparin drip. Heparin allergy will be removed from chart. Noted thrombosed LU arm AVF 9/21. Patient is not on anticoagulation PTA. No systemic heparin ordered with CRRT.  Argatroban level was slightly low at 47 this AM. Hg low stable 8 (on Aranesp - last dose 9/28), plt down slightly to 56 today. No active bleed issues reported.  Goal of Therapy:  Heparin level 0.3-0.7 units/ml Monitor platelets by anticoagulation protocol: Yes   Plan:  D/c argatroban >> transition back to heparin IV with HIT ruled out No bolus. Start heparin at 1250 units/hr Check 8hr heparin level Monitor daily heparin level and CBC, s/sx bleeding  Elicia Lamp, PharmD, BCPS Clinical Pharmacist 03/05/2020 10:27 AM

## 2020-03-05 NOTE — Progress Notes (Signed)
NAME:  Wanda Ramirez, MRN:  [id number], DOB:  07-08-1970, LOS: 27 ADMISSION DATE:  02/13/2020, CONSULTATION DATE: 02/24/2020 REFERRING MD: Dr. Heber [place], CHIEF COMPLAINT: Increasing supplemental oxygen demand  Brief History   Wanda Ramirez is a 49 year old female with a past medical history significant for end-stage renal disease on hemodialysis MWF, chronic hypoxic respiratory failure on 3 L nasal cannula at baseline, sleep apnea, diabetes, hypertension, hyperlipidemia, hypothyroidism, and asthma who presented to the emergency department with complaints of worsening shortness of breath, dry cough, generalized body aches, and loose stools.  Patient was diagnosed 9/9 with Covid pneumonia.  Initially she was able to maintain outpatient status but when symptoms worsened 9/14 she presented to the emergency department.  Patient reported dyspnea and weakness worsened to the point where she was unable to ambulate effectively.  She also reports she was unable to maintain adequate oral intake.  Patient received the moderna vaccines in March and April of this year.  Patient presented initially febrile with a temperature of 102.9 and mildly tachypneic with all other vital signs within normal limits.  She was initially managed on 4 L nasal cannula.  Lab work significant for NA 130, K3.8, glucose 454, anion gap 21, lactic acid 1.8, LDH 296, ferritin 6690, CRP 13.3, and procalcitonin 10.51  On review of medical record it appears patient has progressively required increased supplemental oxygen requirement.  Afternoon of 9/24 patient had episode of desaturation requiring further increase in supplemental oxygen.  On chart assessment it appears patient has been placed on 40 L with reported increased work of breathing prompting PCCM consultation  Past Medical History  Sleep apnea Morbid obesity Hypothyroidism Hypertension GERD  End-stage renal disease on HD MWF Diabetes Asthma   Immunization History   Administered Date(s) Administered  . Influenza,inj,Quad PF,6+ Mos 02/09/2018     Significant Hospital Events   Admitted 9/14  9/24 - Lying in bed on side currently undergoing iHD,  She is lethargic but will arouse to verbal stimuli but quickly falls back to sleep.  RN states patient has had progressive decline over the last 12hrs 9/25 - -currently in medical ICU to [facility].  Bed 12.  She has been on BiPAP with Precedex overnight.  This morning on the low-dose of Precedex she got agitated.?  Also had melena.  She also desaturated.  Nursing then increase her Precedex and since then she has been more stable.  Yesterday dialysis was cut short.  9/26 -unable to run CRRT with prone ventilation.  Resume supine ventilation but still unable to draw blood back. 9/27 transferred to 29m. IR replaced HD catheter 10/1 on CRRT and vasopressors  Consults:  PCCM  Procedures:  9/23 IR guided tunneled HD cath placement - left subclavian  Significant Diagnostic Tests:  Pulmonary VQ scan at 9/21 > negative  Micro Data:  See micro tab  Antimicrobials:  Cefepime 9/24 > Azithromycin 9/21 > 9/26 Remdesivir completed 9/17 > 9/19 Tocilizumab x1  Interim history/subjective:  Starting to really improve On wean but very sedated.  Objective   Blood pressure 133/61, pulse 91, temperature 97.9 F (36.6 C), resp. rate 19, height 5\' 2"  (1.575 m), weight 108 kg, SpO2 100 %.    Vent Mode: PRVC FiO2 (%):  [40 %] 40 % Set Rate:  [32 bmp] 32 bmp Vt Set:  [400 mL] 400 mL PEEP:  [5 cmH20-8 cmH20] 8 cmH20 Plateau Pressure:  [24 cmH20-27 cmH20] 24 cmH20   Intake/Output Summary (Last 24 hours) at 03/05/2020 1026 Last data  filed at 03/05/2020 1000 Gross per 24 hour  Intake 1386.27 ml  Output 2987 ml  Net -1600.73 ml   Filed Weights   03/03/20 0500 03/04/20 0500 03/05/20 0200  Weight: 112.9 kg 109.2 kg 108 kg   Constitutional: middle aged woman in no acute distress Eyes: eyes are anicteric, reactive to  light Ears, nose, mouth, and throat: mucous membranes moist, trachea midline, ETT with minimal secretions Cardiovascular: heart sounds are regular, ext are warm to touch. Trace global edema Respiratory: very clear, no wheezing, triggering vent Gastrointestinal: abdomen is soft with + BS Skin: No rashes, normal turgor Neurologic: starting to open eyes to voice, working on sedation wean Psychiatric: RASS -1  CBG good Afebrile WBC improved Net neg 1.6L BMP looks good on CRRT Stable anemia and thrombocytopenia  Resolved Hospital Problem list   Mixed metabolic/respiratory acidosis  Assessment & Plan:   Acute hypoxic/hypercapnic respiratory failure due to ARDS secondary to COVID 19 Pneumonia Patient has completed steroid, remdesivir, immunomodulating therapy. Start working on vent weaning Needs at least 1 more day of offloading fluid and finding good sedation level before extubation  ESRD on HD  On CRRT, tolerating well Appreciate nephrology help, continue to push fluid removal  Acute on chronic Anemia likely component of chronic disease with blood loss no obvious signs of bleeding s/p 2 u prbc on 9/28  Shock, undifferentiated, improving Patient's echocardiogram shows RV overload.  Could be RV stunning from PVR increase due to COVID +/- septic effect of COVID, regardless, improved. She is off antibiotics  Poorly controlled diabetes with hyperglycemia, A1c 12.8% on admission Better controlled with SSI and long-acting  Thrombocytopenia- HIT Ab neg, low 4T score, fine to put back on heparin drip; I would keep her fully anticoagulated in setting of RV dysfunction, high D dimer, and recent COVID  Best practice:  Diet:TF Pain/Anxiety/Delirium protocol (if indicated): wean VAP protocol (if indicated): in place DVT prophylaxis: heparin gtt GI prophylaxis: PPI Glucose control: see above Mobility: Bedrest Code Status: Full Family Communication: Patient's family will be updated  over the phone Disposition: ICU pending vent and CRRT liberation, should be fine for non-COVID bed  The patient is critically ill with multiple organ systems failure and requires high complexity decision making for assessment and support, frequent evaluation and titration of therapies, application of advanced monitoring technologies and extensive interpretation of multiple databases. Critical Care Time devoted to patient care services described in this note independent of APP/resident time (if applicable)  is 34 minutes.   [name] MD [facility] 03/05/2020 10:32 AM Personal pager: [phone] If unanswered, please page CCM On-call: [phone]

## 2020-03-05 NOTE — Progress Notes (Signed)
[place] Progress Note Patient Name: Wanda Ramirez DOB: 04-12-1971 MRN: [id number]   Date of Service  03/05/2020  HPI/Events of Note  AM labs requested  eICU Interventions  Ordered     Intervention Category Minor Interventions: Clinical assessment - ordering diagnostic tests  Margaretmary Lombard 03/05/2020, 2:39 AM

## 2020-03-05 NOTE — Progress Notes (Addendum)
Elink notified Cortrak not flushing after troubleshooting and multiple attempts to restart tube feeds.  2137: Spoke with on call MD, RN to page Cortrak team in AM to assess placement, suspected kink in line due to line not flushing. Tube feeds okay to be held and Synthroid to be rescheduled for when Cortrak issue resolved.

## 2020-03-05 NOTE — Progress Notes (Signed)
Wanda Ramirez, Wanda Ramirez Daughter   [phone]   Called and updated family member above.

## 2020-03-05 NOTE — Progress Notes (Signed)
Assisted tele visit to patient with family member.  Aamirah Salmi Ann, RN  

## 2020-03-06 DIAGNOSIS — J9601 Acute respiratory failure with hypoxia: Secondary | ICD-10-CM | POA: Diagnosis not present

## 2020-03-06 DIAGNOSIS — U071 COVID-19: Secondary | ICD-10-CM | POA: Diagnosis not present

## 2020-03-06 LAB — RENAL FUNCTION PANEL
Albumin: 1.9 g/dL — ABNORMAL LOW (ref 3.5–5.0)
Anion gap: 11 (ref 5–15)
BUN: 23 mg/dL — ABNORMAL HIGH (ref 6–20)
CO2: 23 mmol/L (ref 22–32)
Calcium: 8.7 mg/dL — ABNORMAL LOW (ref 8.9–10.3)
Chloride: 102 mmol/L (ref 98–111)
Creatinine, Ser: 2.12 mg/dL — ABNORMAL HIGH (ref 0.44–1.00)
GFR calc non Af Amer: 27 mL/min — ABNORMAL LOW (ref >60)
Glucose, Bld: 166 mg/dL — ABNORMAL HIGH (ref 70–99)
Phosphorus: 3.7 mg/dL (ref 2.5–4.6)
Potassium: 4.4 mmol/L (ref 3.5–5.1)
Sodium: 136 mmol/L (ref 135–145)

## 2020-03-06 LAB — CBC
HCT: 26 % — ABNORMAL LOW (ref 36.0–46.0)
Hemoglobin: 8.2 g/dL — ABNORMAL LOW (ref 12.0–15.0)
MCH: 31.2 pg (ref 26.0–34.0)
MCHC: 31.5 g/dL (ref 30.0–36.0)
MCV: 98.9 fL (ref 80.0–100.0)
Platelets: 63 10*3/uL — ABNORMAL LOW (ref 150–400)
RBC: 2.63 MIL/uL — ABNORMAL LOW (ref 3.87–5.11)
RDW: 24.4 % — ABNORMAL HIGH (ref 11.5–15.5)
WBC: 12.5 10*3/uL — ABNORMAL HIGH (ref 4.0–10.5)
nRBC: 2.6 % — ABNORMAL HIGH (ref 0.0–0.2)

## 2020-03-06 LAB — GLUCOSE, CAPILLARY
Glucose-Capillary: 112 mg/dL — ABNORMAL HIGH (ref 70–99)
Glucose-Capillary: 131 mg/dL — ABNORMAL HIGH (ref 70–99)
Glucose-Capillary: 148 mg/dL — ABNORMAL HIGH (ref 70–99)
Glucose-Capillary: 97 mg/dL (ref 70–99)
Glucose-Capillary: 144 mg/dL — ABNORMAL HIGH (ref 70–99)
Glucose-Capillary: 159 mg/dL — ABNORMAL HIGH (ref 70–99)

## 2020-03-06 LAB — MAGNESIUM: Magnesium: 2.7 mg/dL — ABNORMAL HIGH (ref 1.7–2.4)

## 2020-03-06 LAB — PATHOLOGIST SMEAR REVIEW

## 2020-03-06 LAB — PHOSPHORUS: Phosphorus: 3.4 mg/dL (ref 2.5–4.6)

## 2020-03-06 LAB — HEPARIN LEVEL (UNFRACTIONATED): Heparin Unfractionated: 0.68 IU/mL (ref 0.30–0.70)

## 2020-03-06 MED ORDER — DEXMEDETOMIDINE HCL IN NACL 400 MCG/100ML IV SOLN
0.4000 ug/kg/h | INTRAVENOUS | Status: DC
  Administered 2020-03-06: 0.4 ug/kg/h via INTRAVENOUS
  Administered 2020-03-06: 0.8 ug/kg/h via INTRAVENOUS
  Administered 2020-03-07: 1.2 ug/kg/h via INTRAVENOUS
  Administered 2020-03-07: 0.6 ug/kg/h via INTRAVENOUS
  Administered 2020-03-07: 1 ug/kg/h via INTRAVENOUS
  Administered 2020-03-08: 0.7 ug/kg/h via INTRAVENOUS
  Administered 2020-03-08: 1.2 ug/kg/h via INTRAVENOUS
  Administered 2020-03-08 – 2020-03-09 (×2): 0.8 ug/kg/h via INTRAVENOUS
  Administered 2020-03-09: 1 ug/kg/h via INTRAVENOUS
  Administered 2020-03-09: 1.2 ug/kg/h via INTRAVENOUS
  Filled 2020-03-06 (×2): qty 100
  Filled 2020-03-06: qty 200
  Filled 2020-03-06 (×9): qty 100

## 2020-03-06 MED ORDER — FENTANYL BOLUS VIA INFUSION
50.0000 ug | INTRAVENOUS | Status: DC | PRN
  Administered 2020-03-09 – 2020-03-19 (×24): 50 ug via INTRAVENOUS
  Filled 2020-03-06: qty 50

## 2020-03-06 MED ORDER — FENTANYL CITRATE (PF) 100 MCG/2ML IJ SOLN
50.0000 ug | Freq: Once | INTRAMUSCULAR | Status: AC
  Administered 2020-03-07: 50 ug via INTRAVENOUS

## 2020-03-06 MED ORDER — FENTANYL 2500MCG IN NS 250ML (10MCG/ML) PREMIX INFUSION
100.0000 ug/h | INTRAVENOUS | Status: DC
  Administered 2020-03-09 – 2020-03-10 (×3): 200 ug/h via INTRAVENOUS
  Administered 2020-03-10 – 2020-03-11 (×2): 400 ug/h via INTRAVENOUS
  Filled 2020-03-06 (×5): qty 250

## 2020-03-06 MED ORDER — FENTANYL CITRATE (PF) 100 MCG/2ML IJ SOLN
50.0000 ug | INTRAMUSCULAR | Status: DC | PRN
  Administered 2020-03-06 – 2020-03-07 (×4): 50 ug via INTRAVENOUS
  Filled 2020-03-06 (×5): qty 2

## 2020-03-06 NOTE — Progress Notes (Addendum)
[facility] Progress Note  Subjective:  Pt seen in ICU, on vent and sedated  - on IV argatroban  - off pressors - I/O = -1.9 liters yest net - 108kg, lowest wt here - CVP low 0- 4 today - Qd 1800 cc/hr, all 4/2.5 fluids   Vitals:   03/06/20 1000 03/06/20 1100 03/06/20 1200 03/06/20 1300  BP:      Pulse: (!) 103     Resp: (!) 21 (!) 23 (!) 25 (!) 25  Temp: 99 F (37.2 C) 98.8 F (37.1 C) 98.8 F (37.1 C) 98.8 F (37.1 C)  TempSrc: Rectal     SpO2: 100%  100%   Weight:      Height:        Exam:  on vent ,sedated  no jvd  throat ett in place  Chest cta bilat and lat  Cor reg no RG  Abd soft ntnd no ascites   Ext no LE edema, 1+ UE edema   Neuro on vent and sedated    LUA AVF/ IJ TDC    OP HD: TTS  4h 59mn 450/800  2/2.25 bath  111.5kg  Hep 2000  L AVF  - darbe 50 ug q week, last 9/7  - calc 1.0 tiw  - 9/9 Hb 10.0, tsat 22%  Assessment/ Plan: # COVID pna w/ acute on chronic hypoxic respiratory failure:  dexamethasone, status post tocilizumab and remdesivir.  Severe CXR changes.    # Acute hypoxic resp failure  - mechanical ventilation per primary team   # ESRD: TTS HD.  Continue CRRT.  UF goal 0- 50 ml/hr net neg as tolerated.  Follow PM CRRT labs. Due to low plts any IV hep w/ CRRT was dc'd.   # Clotted LUA AVF: note TDC placed by IR 9/11. Will need new perm access at some point when pt is more stable.  Tunneled catheter exchanged on 9/27 with IR.   # Septic shock - 2/2 covid. Off pressors today.   # Volume status:  hypotension; per charting wt's are below dry wt - change to keep +25-50 cc/hr to account for insensible losses  # Anemia of Chronic Kidney Disease: aranesp increased to 150 mcg for 9/28 and onward for now  # Secondary Hyperparathyroidism/Hyperphosphatemia:  while on CRRT discontinued Auryxia and temporarily discontinued Sensipar 60 mg every other day  #DM2 - management per primary team  RKelly Splinter MD 03/06/2020, 1:47  PM           Recent Labs  Lab 03/04/20 1605 03/05/20 0450 03/05/20 1703 03/06/20 0432  K  --  4.0 4.6  --   BUN  --  27* 31*  --   CREATININE  --  1.82* 2.00*  --   CALCIUM  --  8.9 8.6*  --   PHOS   < >  --  3.6 3.4  HGB  --  8.0*  --  8.2*   < > = values in this interval not displayed.   Inpatient medications: . B-complex with vitamin C  1 tablet Per Tube Daily  . chlorhexidine gluconate (MEDLINE KIT)  15 mL Mouth Rinse BID  . Chlorhexidine Gluconate Cloth  6 each Topical Q0600  . darbepoetin (ARANESP) injection - DIALYSIS  150 mcg Intravenous Q Tue-HD  . docusate  100 mg Per Tube BID  . feeding supplement (PROSource TF)  45 mL Per Tube QID  . insulin aspart  3-9 Units Subcutaneous Q4H  . insulin aspart  5 Units Subcutaneous Q4H  . insulin detemir  10 Units Subcutaneous Q12H  . levothyroxine  125 mcg Per Tube Q0600  . mouth rinse  15 mL Mouth Rinse 10 times per day  . pantoprazole  40 mg Intravenous Q12H  . polyethylene glycol  17 g Per Tube BID  . sodium chloride flush  10-40 mL Intracatheter Q12H  . sodium chloride flush  3 mL Intravenous Q12H   .  prismasol BGK 4/2.5 500 mL/hr at 03/06/20 1226  .  prismasol BGK 4/2.5 300 mL/hr at 03/05/20 1506  . sodium chloride 10 mL/hr at 03/06/20 1300  . sodium chloride    . dexmedetomidine (PRECEDEX) IV infusion 0.8 mcg/kg/hr (03/06/20 1300)  . dextrose    . feeding supplement (VITAL 1.5 CAL) Stopped (03/05/20 2200)  . heparin 1,200 Units/hr (03/06/20 1300)  . prismasol BGK 4/2.5 2,000 mL/hr at 03/06/20 1212   sodium chloride, sodium chloride, acetaminophen (TYLENOL) oral liquid 160 mg/5 mL, albuterol, dextrose, fentaNYL (SUBLIMAZE) injection, heparin, influenza vac split quadrivalent PF, lip balm, sodium chloride flush, sodium chloride flush

## 2020-03-06 NOTE — Progress Notes (Signed)
ANTICOAGULATION CONSULT NOTE  Pharmacy Consult for heparin Indication: r/o VTE  Heparin Dosing Weight: 78.1 kg  Labs: Recent Labs    03/04/20 0356 03/04/20 0356 03/04/20 0927 03/04/20 0927 03/04/20 1605 03/05/20 0450 03/05/20 1703 03/05/20 1918 03/06/20 0432  HGB 8.0*   < > 7.1*   < >  --  8.0*  --   --  8.2*  HCT 25.1*   < > 21.0*  --   --  25.5*  --   --  26.0*  PLT 59*  --   --   --   --  56*  --   --  63*  APTT 52*  --   --   --  62* 47*  --   --   --   HEPARINUNFRC  --   --   --   --   --   --   --  0.45 0.68  CREATININE 1.90*   < >  --   --  2.00* 1.82* 2.00*  --   --    < > = values in this interval not displayed.    Assessment: 69 yof with ESRD on HD presenting COVID+, admitted to ICU and transitioned to CRRT. HIT ruled out. Patient has been transitioned back to IV Heparin.   Heparin level is therapeutic at top of the range at 0.68 on current rate of 1250 units/hr. Hg low stable at 8.2. Platelets trend up to 63 today. No active bleeding issues reported.   Goal of Therapy:  Heparin level 0.3-0.7 units/ml Monitor platelets by anticoagulation protocol: Yes   Plan:  Reduce heparin slightly to 1200 units/hr to ensure stays in range Monitor daily heparin level and CBC, s/sx bleeding   Arturo Morton, PharmD, BCPS Please check AMION for all [place] contact numbers Clinical Pharmacist 03/06/2020 9:25 AM

## 2020-03-06 NOTE — Progress Notes (Signed)
Raveena, [place] Daughter   [phone]   Called number above for daily update, no answer.

## 2020-03-06 NOTE — Progress Notes (Signed)
NAME:  Wanda Ramirez, MRN:  [id number], DOB:  04-30-1971, LOS: 21 ADMISSION DATE:  02/13/2020, CONSULTATION DATE: 02/24/2020 REFERRING MD: Dr. Heber [place], CHIEF COMPLAINT: Increasing supplemental oxygen demand  Brief History   Conny Situ is a 49 year old female with a past medical history significant for end-stage renal disease on hemodialysis MWF, chronic hypoxic respiratory failure on 3 L nasal cannula at baseline, sleep apnea, diabetes, hypertension, hyperlipidemia, hypothyroidism, and asthma who presented to the emergency department with complaints of worsening shortness of breath, dry cough, generalized body aches, and loose stools.  Patient was diagnosed 9/9 with Covid pneumonia.  Initially she was able to maintain outpatient status but when symptoms worsened 9/14 she presented to the emergency department.  Patient reported dyspnea and weakness worsened to the point where she was unable to ambulate effectively.  She also reports she was unable to maintain adequate oral intake.  Patient received the moderna vaccines in March and April of this year.  Patient presented initially febrile with a temperature of 102.9 and mildly tachypneic with all other vital signs within normal limits.  She was initially managed on 4 L nasal cannula.  Lab work significant for NA 130, K3.8, glucose 454, anion gap 21, lactic acid 1.8, LDH 296, ferritin 6690, CRP 13.3, and procalcitonin 10.51  On review of medical record it appears patient has progressively required increased supplemental oxygen requirement.  Afternoon of 9/24 patient had episode of desaturation requiring further increase in supplemental oxygen.  On chart assessment it appears patient has been placed on 40 L with reported increased work of breathing prompting PCCM consultation  Past Medical History  Sleep apnea Morbid obesity Hypothyroidism Hypertension GERD  End-stage renal disease on HD MWF Diabetes Asthma   Immunization History   Administered Date(s) Administered  . Influenza,inj,Quad PF,6+ Mos 02/09/2018     Significant Hospital Events   Admitted 9/14  9/24 - Lying in bed on side currently undergoing iHD,  She is lethargic but will arouse to verbal stimuli but quickly falls back to sleep.  RN states patient has had progressive decline over the last 12hrs 9/25 - -currently in medical ICU to [facility].  Bed 12.  She has been on BiPAP with Precedex overnight.  This morning on the low-dose of Precedex she got agitated.?  Also had melena.  She also desaturated.  Nursing then increase her Precedex and since then she has been more stable.  Yesterday dialysis was cut short.  9/26 -unable to run CRRT with prone ventilation.  Resume supine ventilation but still unable to draw blood back. 9/27 transferred to 18m. IR replaced HD catheter 10/1 on CRRT and vasopressors  Consults:  PCCM  Procedures:  9/23 IR guided tunneled HD cath placement - left subclavian  Significant Diagnostic Tests:  Pulmonary VQ scan at 9/21 > negative  Micro Data:  See micro tab  Antimicrobials:  Cefepime 9/24 > Azithromycin 9/21 > 9/26 Remdesivir completed 9/17 > 9/19 Tocilizumab x1  Interim history/subjective:  Another 2L removed yesterday. Still somnolent on 200 mcg/hr fentanyl  Objective   Blood pressure (!) 112/58, pulse (!) 103, temperature 99 F (37.2 C), temperature source Rectal, resp. rate 17, height 5\' 2"  (1.575 m), weight 108 kg, SpO2 100 %.    Vent Mode: PRVC FiO2 (%):  [40 %] 40 % Set Rate:  [32 bmp] 32 bmp Vt Set:  [400 mL] 400 mL PEEP:  [8 cmH20] 8 cmH20 Plateau Pressure:  [26 cmH20] 26 cmH20   Intake/Output Summary (Last 24 hours) at  03/06/2020 0802 Last data filed at 03/06/2020 0700 Gross per 24 hour  Intake 1276.27 ml  Output 3214 ml  Net -1937.73 ml   Filed Weights   03/03/20 0500 03/04/20 0500 03/05/20 0200  Weight: 112.9 kg 109.2 kg 108 kg   Constitutional: middle aged woman in no acute distress Eyes:  eyes are anicteric, reactive to light Ears, nose, mouth, and throat: mucous membranes moist, trachea midline, ETT with thick pink secretions Cardiovascular: heart sounds are regular, ext are warm to touch. Trace global edema Respiratory: clear, no wheezing, triggering vent Gastrointestinal: abdomen is soft with + BS Skin: No rashes, normal turgor Neurologic: opens eyes to voice but no tracking, no motor response to pain, really need to get down her sedation Psychiatric: RASS -1  CBG good Afebrile WBC stable BMP looks good on CRRT Stable anemia and thrombocytopenia  Resolved Hospital Problem list   Mixed metabolic/respiratory acidosis  Assessment & Plan:   Acute hypoxic/hypercapnic respiratory failure due to ARDS secondary to COVID 19 Pneumonia Patient has completed steroid, remdesivir, immunomodulating therapy. Start working on vent weaning Mental status is a barrier currently, hold all sedation, precedex if have issues  ESRD on HD  On CRRT, tolerating well Appreciate nephrology help, continue to push fluid removal  Acute on chronic Anemia likely component of chronic disease with blood loss no obvious signs of bleeding s/p 2 u prbc on 9/28  Shock, undifferentiated, improved Patient's echocardiogram shows RV overload.  Could be RV stunning from PVR increase due to COVID +/- septic effect of COVID, regardless, improved. She is off antibiotics  Poorly controlled diabetes with hyperglycemia, A1c 12.8% on admission Better controlled with SSI and long-acting  Thrombocytopenia- HIT Ab neg, low 4T score, resumed heparin drip 10/4 - I would keep her fully anticoagulated in setting of RV dysfunction, high D dimer, and recent COVID, probably could transition to eliquis once off vent.  Would keep on for ~1 month.  Best practice:  Diet:TF Pain/Anxiety/Delirium protocol (if indicated): wean VAP protocol (if indicated): in place DVT prophylaxis: heparin gtt GI prophylaxis:  PPI Glucose control: see above Mobility: Bedrest Code Status: Full Family Communication: Patient's family will be updated over the phone Disposition: ICU pending vent and CRRT liberation, should be fine for non-COVID bed  The patient is critically ill with multiple organ systems failure and requires high complexity decision making for assessment and support, frequent evaluation and titration of therapies, application of advanced monitoring technologies and extensive interpretation of multiple databases. Critical Care Time devoted to patient care services described in this note independent of APP/resident time (if applicable)  is 35 minutes.   [name] MD [facility] 03/06/2020 8:02 AM Personal pager: [phone] If unanswered, please page CCM On-call: [phone]

## 2020-03-07 DIAGNOSIS — U071 COVID-19: Secondary | ICD-10-CM | POA: Diagnosis not present

## 2020-03-07 DIAGNOSIS — L8990 Pressure ulcer of unspecified site, unspecified stage: Secondary | ICD-10-CM | POA: Insufficient documentation

## 2020-03-07 DIAGNOSIS — J9601 Acute respiratory failure with hypoxia: Secondary | ICD-10-CM | POA: Diagnosis not present

## 2020-03-07 LAB — POCT I-STAT 7, (LYTES, BLD GAS, ICA,H+H)
Acid-base deficit: 1 mmol/L (ref 0.0–2.0)
Bicarbonate: 24 mmol/L (ref 20.0–28.0)
Calcium, Ion: 1.25 mmol/L (ref 1.15–1.40)
HCT: 27 % — ABNORMAL LOW (ref 36.0–46.0)
Hemoglobin: 9.2 g/dL — ABNORMAL LOW (ref 12.0–15.0)
O2 Saturation: 94 %
Patient temperature: 36.6
Potassium: 4.9 mmol/L (ref 3.5–5.1)
Sodium: 135 mmol/L (ref 135–145)
TCO2: 25 mmol/L (ref 22–32)
pCO2 arterial: 41.7 mmHg (ref 32.0–48.0)
pH, Arterial: 7.366 (ref 7.350–7.450)
pO2, Arterial: 71 mmHg — ABNORMAL LOW (ref 83.0–108.0)

## 2020-03-07 LAB — RENAL FUNCTION PANEL
Albumin: 1.8 g/dL — ABNORMAL LOW (ref 3.5–5.0)
Albumin: 1.8 g/dL — ABNORMAL LOW (ref 3.5–5.0)
Anion gap: 10 (ref 5–15)
Anion gap: 12 (ref 5–15)
BUN: 23 mg/dL — ABNORMAL HIGH (ref 6–20)
BUN: 23 mg/dL — ABNORMAL HIGH (ref 6–20)
CO2: 23 mmol/L (ref 22–32)
CO2: 23 mmol/L (ref 22–32)
Calcium: 8.7 mg/dL — ABNORMAL LOW (ref 8.9–10.3)
Calcium: 8.7 mg/dL — ABNORMAL LOW (ref 8.9–10.3)
Chloride: 104 mmol/L (ref 98–111)
Chloride: 102 mmol/L (ref 98–111)
Creatinine, Ser: 2.02 mg/dL — ABNORMAL HIGH (ref 0.44–1.00)
Creatinine, Ser: 1.92 mg/dL — ABNORMAL HIGH (ref 0.44–1.00)
GFR calc non Af Amer: 28 mL/min — ABNORMAL LOW (ref >60)
GFR calc non Af Amer: 30 mL/min — ABNORMAL LOW (ref >60)
Glucose, Bld: 117 mg/dL — ABNORMAL HIGH (ref 70–99)
Glucose, Bld: 179 mg/dL — ABNORMAL HIGH (ref 70–99)
Phosphorus: 3.4 mg/dL (ref 2.5–4.6)
Phosphorus: 4.5 mg/dL (ref 2.5–4.6)
Potassium: 4 mmol/L (ref 3.5–5.1)
Potassium: 4.8 mmol/L (ref 3.5–5.1)
Sodium: 137 mmol/L (ref 135–145)
Sodium: 137 mmol/L (ref 135–145)

## 2020-03-07 LAB — GLUCOSE, CAPILLARY
Glucose-Capillary: 96 mg/dL (ref 70–99)
Glucose-Capillary: 135 mg/dL — ABNORMAL HIGH (ref 70–99)
Glucose-Capillary: 149 mg/dL — ABNORMAL HIGH (ref 70–99)
Glucose-Capillary: 167 mg/dL — ABNORMAL HIGH (ref 70–99)
Glucose-Capillary: 180 mg/dL — ABNORMAL HIGH (ref 70–99)

## 2020-03-07 LAB — CBC
HCT: 24.8 % — ABNORMAL LOW (ref 36.0–46.0)
Hemoglobin: 7.6 g/dL — ABNORMAL LOW (ref 12.0–15.0)
MCH: 30.6 pg (ref 26.0–34.0)
MCHC: 30.6 g/dL (ref 30.0–36.0)
MCV: 100 fL (ref 80.0–100.0)
Platelets: 81 10*3/uL — ABNORMAL LOW (ref 150–400)
RBC: 2.48 MIL/uL — ABNORMAL LOW (ref 3.87–5.11)
RDW: 24.8 % — ABNORMAL HIGH (ref 11.5–15.5)
WBC: 11 10*3/uL — ABNORMAL HIGH (ref 4.0–10.5)
nRBC: 1.5 % — ABNORMAL HIGH (ref 0.0–0.2)

## 2020-03-07 LAB — HEPARIN LEVEL (UNFRACTIONATED)
Heparin Unfractionated: 0.12 IU/mL — ABNORMAL LOW (ref 0.30–0.70)
Heparin Unfractionated: 0.24 IU/mL — ABNORMAL LOW (ref 0.30–0.70)

## 2020-03-07 LAB — MAGNESIUM: Magnesium: 2.7 mg/dL — ABNORMAL HIGH (ref 1.7–2.4)

## 2020-03-07 MED ORDER — NEPRO/CARBSTEADY PO LIQD: 237.0000 mL | Freq: Three times a day (TID) | ORAL | Status: DC

## 2020-03-07 MED ORDER — DEXAMETHASONE SODIUM PHOSPHATE 4 MG/ML IJ SOLN
4.0000 mg | Freq: Four times a day (QID) | INTRAMUSCULAR | Status: AC
  Administered 2020-03-07 – 2020-03-09 (×8): 4 mg via INTRAVENOUS
  Filled 2020-03-07 (×8): qty 1

## 2020-03-07 MED ORDER — ORAL CARE MOUTH RINSE
15.0000 mL | Freq: Two times a day (BID) | OROMUCOSAL | Status: DC
  Administered 2020-03-07 – 2020-03-11 (×3): 15 mL via OROMUCOSAL

## 2020-03-07 NOTE — Progress Notes (Signed)
ANTICOAGULATION CONSULT NOTE  Pharmacy Consult for heparin Indication: r/o VTE  Heparin Dosing Weight: 78.1 kg  Labs: Recent Labs    03/04/20 1605 03/04/20 1605 03/05/20 0450 03/05/20 0450 03/05/20 1703 03/05/20 1918 03/06/20 0432 03/06/20 1713 03/07/20 0458 03/07/20 1231  HGB  --   --  8.0*   < >  --   --  8.2*  --  7.6*  --   HCT  --   --  25.5*  --   --   --  26.0*  --  24.8*  --   PLT  --   --  56*  --   --   --  63*  --  81*  --   APTT 62*  --  47*  --   --   --   --   --   --   --   HEPARINUNFRC  --   --   --   --   --    < > 0.68  --  0.12* 0.24*  CREATININE 2.00*   < > 1.82*   < > 2.00*  --   --  2.12* 2.02*  --    < > = values in this interval not displayed.    Assessment: 67 yof with ESRD on HD presenting COVID+, admitted to ICU and transitioned to CRRT. HIT ruled out. Patient has been transitioned back to IV Heparin.   Heparin level improved but remains subtherapeutic at 0.24 after rate increase this morning. Hg down a bit to 7.6. Platelets trend up to 81. No bleeding or issues with infusion per discussion with RN.  Goal of Therapy:  Heparin level 0.3-0.7 units/ml Monitor platelets by anticoagulation protocol: Yes   Plan:  Increase heparin to 1450 units/hr Check 6hr heparin level Monitor daily heparin level and CBC, s/sx bleeding   Arturo Morton, PharmD, BCPS Please check AMION for all [place] contact numbers Clinical Pharmacist 03/07/2020 2:29 PM

## 2020-03-07 NOTE — Progress Notes (Signed)
NAME:  Wanda Ramirez, MRN:  [id number], DOB:  05/05/1971, LOS: 41 ADMISSION DATE:  02/13/2020, CONSULTATION DATE: 02/24/2020 REFERRING MD: Dr. Heber [place], CHIEF COMPLAINT: Increasing supplemental oxygen demand  Brief History   Wanda Ramirez is a 49 year old female with a past medical history significant for end-stage renal disease on hemodialysis MWF, chronic hypoxic respiratory failure on 3 L nasal cannula at baseline, sleep apnea, diabetes, hypertension, hyperlipidemia, hypothyroidism, and asthma who presented to the emergency department with complaints of worsening shortness of breath, dry cough, generalized body aches, and loose stools.  Patient was diagnosed 9/9 with Covid pneumonia.  Initially she was able to maintain outpatient status but when symptoms worsened 9/14 she presented to the emergency department.  Patient reported dyspnea and weakness worsened to the point where she was unable to ambulate effectively.  She also reports she was unable to maintain adequate oral intake.  Patient received the moderna vaccines in March and April of this year.  Patient presented initially febrile with a temperature of 102.9 and mildly tachypneic with all other vital signs within normal limits.  She was initially managed on 4 L nasal cannula.  Lab work significant for NA 130, K3.8, glucose 454, anion gap 21, lactic acid 1.8, LDH 296, ferritin 6690, CRP 13.3, and procalcitonin 10.51  On review of medical record it appears patient has progressively required increased supplemental oxygen requirement.  Afternoon of 9/24 patient had episode of desaturation requiring further increase in supplemental oxygen.  On chart assessment it appears patient has been placed on 40 L with reported increased work of breathing prompting PCCM consultation  Past Medical History  Sleep apnea Morbid obesity Hypothyroidism Hypertension GERD  End-stage renal disease on HD MWF Diabetes Asthma   Immunization History   Administered Date(s) Administered  . Influenza,inj,Quad PF,6+ Mos 02/09/2018     Significant Hospital Events   Admitted 9/14  9/24 - Lying in bed on side currently undergoing iHD,  She is lethargic but will arouse to verbal stimuli but quickly falls back to sleep.  RN states patient has had progressive decline over the last 12hrs 9/25 - -currently in medical ICU to [facility].  Bed 12.  She has been on BiPAP with Precedex overnight.  This morning on the low-dose of Precedex she got agitated.?  Also had melena.  She also desaturated.  Nursing then increase her Precedex and since then she has been more stable.  Yesterday dialysis was cut short.  9/26 -unable to run CRRT with prone ventilation.  Resume supine ventilation but still unable to draw blood back. 9/27 transferred to 63m. IR replaced HD catheter 10/1 on CRRT and vasopressors  Consults:  PCCM  Procedures:  9/23 IR guided tunneled HD cath placement - left subclavian  Significant Diagnostic Tests:  Pulmonary VQ scan at 9/21 > negative  Micro Data:  See micro tab  Antimicrobials:  Cefepime 9/24 > Azithromycin 9/21 > 9/26 Remdesivir completed 9/17 > 9/19 Tocilizumab x1  Interim history/subjective:  Finally starting to wake up and follow commands. Placed in PS.  Objective   Blood pressure (!) 162/79, pulse (!) 101, temperature 99.3 F (37.4 C), resp. rate (!) 28, height 5\' 2"  (1.575 m), weight 108 kg, SpO2 96 %. CVP:  [4 mmHg-8 mmHg] 4 mmHg  Vent Mode: PRVC FiO2 (%):  [40 %] 40 % Set Rate:  [32 bmp] 32 bmp Vt Set:  [400 mL] 400 mL PEEP:  [5 cmH20] 5 cmH20 Pressure Support:  [5 cmH20] 5 cmH20   Intake/Output Summary (  Last 24 hours) at 03/07/2020 1040 Last data filed at 03/07/2020 1000 Gross per 24 hour  Intake 1041.93 ml  Output 1523 ml  Net -481.07 ml   Filed Weights   03/03/20 0500 03/04/20 0500 03/05/20 0200  Weight: 112.9 kg 109.2 kg 108 kg   Constitutional: middle aged woman in no acute distress Eyes:  eyes are anicteric, reactive to light Ears, nose, mouth, and throat: mucous membranes moist, trachea midline, ETT improved thick pink secretions Cardiovascular: heart sounds are regular, ext are warm to touch. Global edema improved. Respiratory: clear, no wheezing, triggering vent Gastrointestinal: abdomen is soft with + BS Skin: No rashes, normal turgor Neurologic: now opening eyes and following commands, gets anxious with ETT now on precedex Psychiatric: RASS -1  CBG good Afebrile WBC stable H/H slightly down BMP looks good on CRRT Plts improved  Resolved Hospital Problem list   Mixed metabolic/respiratory acidosis  Assessment & Plan:   Acute hypoxic/hypercapnic respiratory failure due to ARDS secondary to COVID 19 Pneumonia Patient has completed steroid, remdesivir, immunomodulating therapy. Work toward extubation and progressive mobility today  ESRD on HD  On CRRT, tolerating well Appreciate nephrology help, continue to push fluid removal as tolerated by BPs  Acute on chronic Anemia likely component of chronic disease with blood loss no obvious signs of bleeding s/p 2 u prbc on 9/28  Shock, undifferentiated, improved Patient's echocardiogram shows RV overload.  Could be RV stunning from PVR increase due to COVID +/- septic effect of COVID, regardless, improved. She is off antibiotics  Poorly controlled diabetes with hyperglycemia, A1c 12.8% on admission Better controlled with SSI and long-acting  Thrombocytopenia- HIT Ab neg, low 4T score, resumed heparin drip 10/4 - I would keep her fully anticoagulated in setting of RV dysfunction, high D dimer, and recent COVID, probably could transition to eliquis once off vent.  Would keep on for ~1 month.  Best practice:  Diet:hold for extubation Pain/Anxiety/Delirium protocol (if indicated): wean VAP protocol (if indicated): in place DVT prophylaxis: heparin gtt GI prophylaxis: PPI Glucose control: see above Mobility:  Bedrest Code Status: Full Family Communication: updated daughter by phone 10/6 Disposition: ICU pending vent and CRRT liberation, should be fine for non-COVID bed  The patient is critically ill with multiple organ systems failure and requires high complexity decision making for assessment and support, frequent evaluation and titration of therapies, application of advanced monitoring technologies and extensive interpretation of multiple databases. Critical Care Time devoted to patient care services described in this note independent of APP/resident time (if applicable)  is 37 minutes.   [name] MD [facility] 03/07/2020 10:40 AM Personal pager: [phone] If unanswered, please page CCM On-call: [phone]

## 2020-03-07 NOTE — Progress Notes (Addendum)
[facility] Progress Note  Subjective:  Pt seen in ICU, on vent and sedated  - off IV argatroban  - remains off pressors  - FiO2 50, peep 6 - I/O = -1.1 yest - no wts today - CVP low 0- 4 yest  - Qd 1800 cc/hr, all 4/2.5 fluids - labs ok, P 3.4, K 4  Vitals:   03/07/20 1300 03/07/20 1312 03/07/20 1330 03/07/20 1400  BP:      Pulse: (!) 113  (!) 122 (!) 117  Resp: (!) 29  (!) 28 (!) 25  Temp: 98.6 F (37 C)  98.6 F (37 C) 98.4 F (36.9 C)  TempSrc:      SpO2: 94% 90% 100% 91%  Weight:      Height:        Exam:   Patient not examined today directly given COVID-19 + status, utilizing data taken from chart +/- discussions w/ providers and staff.    OP HD: TTS  4h 49mn 450/800  2/2.25 bath  111.5kg  Hep 2000  L AVF  - darbe 50 ug q week, last 9/7  - calc 1.0 tiw  - 9/9 Hb 10.0, tsat 22%  Assessment/ Plan: # COVID pna w/ acute on chronic hypoxic respiratory failure:  dexamethasone, status post tocilizumab and remdesivir.  Severe CXR changes.    # Acute hypoxic resp failure  - mechanical ventilation per primary team   # ESRD: TTS HD.  Continue CRRT. - pt off pressors for 1-2 days - keeping +25- 50 cc/hr now, awaiting CVP and wt today - due to low plts all IV hep w/ CRRT was dc'd   # Clotted LUA AVF: note TDC placed by IR 9/11. Will need new perm access at some point when pt is more stable.  Tunneled catheter exchanged on 9/27 with IR.   # Septic shock - 2/2 covid  - off pressors today  # Volume status:  hypotension; per charting wt's are below dry wt - change to keep +25-50 cc/hr to account for insensible losses  # Anemia of Chronic Kidney Disease: aranesp increased to 150 mcg for 9/28 and weekly for now  # Secondary Hyperparathyroidism/Hyperphosphatemia:  while on CRRT discontinued Auryxia and temporarily discontinued Sensipar 60 mg every other day  #DM2 - management per primary team  RKelly Splinter MD 03/07/2020, 2:34  PM           Recent Labs  Lab 03/05/20 1703 03/06/20 0432 03/06/20 1713 03/07/20 0458  K   < >  --  4.4 4.0  BUN   < >  --  23* 23*  CREATININE   < >  --  2.12* 2.02*  CALCIUM   < >  --  8.7* 8.7*  PHOS   < > 3.4 3.7 3.4  HGB  --  8.2*  --  7.6*   < > = values in this interval not displayed.   Inpatient medications: . B-complex with vitamin C  1 tablet Per Tube Daily  . chlorhexidine gluconate (MEDLINE KIT)  15 mL Mouth Rinse BID  . Chlorhexidine Gluconate Cloth  6 each Topical Q0600  . darbepoetin (ARANESP) injection - DIALYSIS  150 mcg Intravenous Q Tue-HD  . dexamethasone (DECADRON) injection  4 mg Intravenous Q6H  . docusate  100 mg Per Tube BID  . feeding supplement (PROSource TF)  45 mL Per Tube QID  . insulin aspart  3-9 Units Subcutaneous Q4H  . insulin aspart  5 Units Subcutaneous Q4H  .  insulin detemir  10 Units Subcutaneous Q12H  . levothyroxine  125 mcg Per Tube Q0600  . mouth rinse  15 mL Mouth Rinse 10 times per day  . pantoprazole  40 mg Intravenous Q12H  . polyethylene glycol  17 g Per Tube BID  . sodium chloride flush  10-40 mL Intracatheter Q12H  . sodium chloride flush  3 mL Intravenous Q12H   .  prismasol BGK 4/2.5 500 mL/hr at 03/07/20 0557  .  prismasol BGK 4/2.5 300 mL/hr at 03/07/20 0443  . sodium chloride 10 mL/hr at 03/07/20 1400  . sodium chloride    . dexmedetomidine (PRECEDEX) IV infusion 0.2 mcg/kg/hr (03/07/20 1400)  . dextrose    . feeding supplement (VITAL 1.5 CAL) Stopped (03/05/20 2200)  . fentaNYL infusion INTRAVENOUS    . heparin 1,300 Units/hr (03/07/20 1400)  . prismasol BGK 4/2.5 1,800 mL/hr at 03/07/20 1224   sodium chloride, sodium chloride, acetaminophen (TYLENOL) oral liquid 160 mg/5 mL, albuterol, dextrose, fentaNYL, fentaNYL (SUBLIMAZE) injection, heparin, influenza vac split quadrivalent PF, lip balm, sodium chloride flush, sodium chloride flush

## 2020-03-07 NOTE — Procedures (Signed)
Extubation Procedure Note  Patient Details:   Name: Wanda Ramirez DOB: [birth year] MRN: [id number]   Airway Documentation:    Vent end date: 03/07/20 Vent end time: 1127   Evaluation  O2 sats: stable throughout Complications: No apparent complications Patient did tolerate procedure well. Bilateral Breath Sounds: Diminished   Patient extubated per MD order & placed on [place] HiFlow [place] at 8L. Will wean as tolerated. Patient able to speak & cough post extubation.  Kathie Dike 03/07/2020, 11:33 AM

## 2020-03-07 NOTE — Progress Notes (Addendum)
Nutrition Follow-up  DOCUMENTATION CODES:   Morbid obesity  INTERVENTION:   Resume tube feeding via Cortrak: Vital 1.5 at 50 ml/h (1200 ml per day) Prosource TF 45 ml QID  Provides 1960 kcal, 125 gm protein, 917 ml free water daily.  Continue B-complex with vitamin C via tube.  NUTRITION DIAGNOSIS:   Inadequate oral intake related to inability to eat as evidenced by NPO status.  Ongoing   GOAL:   Patient will meet greater than or equal to 90% of their needs  Progressing   MONITOR:   PO intake, Supplement acceptance, Labs  REASON FOR ASSESSMENT:   Consult Assessment of nutrition requirement/status  ASSESSMENT:   49 year old female with a past medical history significant for end-stage renal disease on hemodialysis MWF, chronic hypoxic respiratory failure on 3 L nasal cannula at baseline, sleep apnea, diabetes, hypertension, hyperlipidemia, hypothyroidism, and asthma presents with complaints of worsening shortness of breath. Previously diagnosed with COVID PNA on 9/9.  Patient was extubated this morning.  Now on BiPAP. Cortrak was not flushing on 10/4, so TF was held.  RN today has had no issues flushing Cortrak tube.   Patient will be unable to eat while on BiPAP, so TF will be resumed.   New stage 2 pressure injury to R buttocks.  Labs and medications reviewed.   CBG: [phone]  1,767 ml removed with CRRT x 24 hours I/O -1,269 ml since admission  Diet Order:   Diet Order            Diet renal with fluid restriction Fluid restriction: 1200 mL Fluid; Room service appropriate? Yes; Fluid consistency: Thin  Diet effective now                 EDUCATION NEEDS:   Not appropriate for education at this time  Skin:  Skin Assessment: Skin Integrity Issues: Skin Integrity Issues:: Stage II Stage II: R buttocks  Last BM:  10/6  Height:   Ht Readings from Last 1 Encounters:  02/29/20 5\' 2"  (1.575 m)    Weight:   Wt Readings from Last 1  Encounters:  03/05/20 108 kg    Ideal Body Weight:  50 kg  BMI:  Body mass index is 43.55 kg/m.  Estimated Nutritional Needs:   Kcal:  2836-6294  Protein:  110-125 gm  Fluid:  1 L + UOP    Lucas Mallow, RD, LDN, CNSC Please refer to Amion for contact information.

## 2020-03-07 NOTE — Progress Notes (Signed)
Assisted tele visit to patient with family members.  Bentlie Withem Anderson, RN   

## 2020-03-07 NOTE — Plan of Care (Signed)
  Problem: Education: Goal: Knowledge of risk factors and measures for prevention of condition will improve Outcome: Progressing   Problem: Coping: Goal: Psychosocial and spiritual needs will be supported Outcome: Progressing   Problem: Respiratory: Goal: Will maintain a patent airway Outcome: Progressing Goal: Complications related to the disease process, condition or treatment will be avoided or minimized Outcome: Progressing   Problem: Education: Goal: Knowledge of General Education information will improve Description: Including pain rating scale, medication(s)/side effects and non-pharmacologic comfort measures Outcome: Progressing   Problem: Health Behavior/Discharge Planning: Goal: Ability to manage health-related needs will improve Outcome: Progressing   Problem: Clinical Measurements: Goal: Ability to maintain clinical measurements within normal limits will improve Outcome: Progressing Goal: Will remain free from infection Outcome: Progressing Goal: Diagnostic test results will improve Outcome: Progressing Goal: Respiratory complications will improve Outcome: Progressing Goal: Cardiovascular complication will be avoided Outcome: Progressing   Problem: Activity: Goal: Risk for activity intolerance will decrease Outcome: Progressing   Problem: Nutrition: Goal: Adequate nutrition will be maintained Outcome: Progressing   Problem: Coping: Goal: Level of anxiety will decrease Outcome: Progressing   Problem: Elimination: Goal: Will not experience complications related to bowel motility Outcome: Progressing   Problem: Pain Managment: Goal: General experience of comfort will improve Outcome: Progressing   Problem: Safety: Goal: Ability to remain free from injury will improve Outcome: Progressing   Problem: Skin Integrity: Goal: Risk for impaired skin integrity will decrease Outcome: Progressing   Problem: Activity: Goal: Ability to tolerate increased  activity will improve Outcome: Progressing   Problem: Respiratory: Goal: Ability to maintain a clear airway and adequate ventilation will improve Outcome: Progressing   Problem: Role Relationship: Goal: Method of communication will improve Outcome: Progressing   Problem: Education: Goal: Knowledge of disease and its progression will improve Outcome: Progressing Goal: Individualized Educational Video(s) Outcome: Progressing   Problem: Fluid Volume: Goal: Compliance with measures to maintain balanced fluid volume will improve Outcome: Progressing   Problem: Health Behavior/Discharge Planning: Goal: Ability to manage health-related needs will improve Outcome: Progressing   Problem: Nutritional: Goal: Ability to make healthy dietary choices will improve Outcome: Progressing   Problem: Clinical Measurements: Goal: Complications related to the disease process, condition or treatment will be avoided or minimized Outcome: Progressing   Problem: Activity: Goal: Ability to tolerate increased activity will improve Outcome: Progressing   Problem: Respiratory: Goal: Ability to maintain a clear airway and adequate ventilation will improve Outcome: Progressing   Problem: Role Relationship: Goal: Method of communication will improve Outcome: Progressing

## 2020-03-07 NOTE — Progress Notes (Signed)
ANTICOAGULATION CONSULT NOTE  Pharmacy Consult for heparin Indication: r/o VTE  Heparin Dosing Weight: 78.1 kg  Labs: Recent Labs    03/04/20 0927 03/04/20 1605 03/05/20 0450 03/05/20 0450 03/05/20 1703 03/05/20 1918 03/06/20 0432 03/06/20 1713 03/07/20 0458  HGB   < >  --  8.0*   < >  --   --  8.2*  --  7.6*  HCT   < >  --  25.5*  --   --   --  26.0*  --  24.8*  PLT  --   --  56*  --   --   --  63*  --  81*  APTT  --  62* 47*  --   --   --   --   --   --   HEPARINUNFRC  --   --   --   --   --  0.45 0.68  --  0.12*  CREATININE   < > 2.00* 1.82*   < > 2.00*  --   --  2.12* 2.02*   < > = values in this interval not displayed.    Assessment: 5 yof with ESRD on HD presenting COVID+, admitted to ICU and transitioned to CRRT. HIT ruled out. Patient has been transitioned back to IV Heparin.  Heparin level this am 0.12 units/ml.  No bleeding reported.  No issues noted with infusion  Goal of Therapy:  Heparin level 0.3-0.7 units/ml Monitor platelets by anticoagulation protocol: Yes   Plan:  Increase heparin to 1300 units/hr Check heparin level later today Monitor daily heparin level and CBC, s/sx bleeding  Thanks for allowing pharmacy to be a part of this patient's care. Excell Seltzer, PharmD Clinical Pharmacist  03/07/2020 5:54 AM

## 2020-03-08 DIAGNOSIS — U071 COVID-19: Secondary | ICD-10-CM | POA: Diagnosis not present

## 2020-03-08 LAB — GLUCOSE, CAPILLARY
Glucose-Capillary: 139 mg/dL — ABNORMAL HIGH (ref 70–99)
Glucose-Capillary: 141 mg/dL — ABNORMAL HIGH (ref 70–99)
Glucose-Capillary: 172 mg/dL — ABNORMAL HIGH (ref 70–99)
Glucose-Capillary: 173 mg/dL — ABNORMAL HIGH (ref 70–99)
Glucose-Capillary: 214 mg/dL — ABNORMAL HIGH (ref 70–99)
Glucose-Capillary: 240 mg/dL — ABNORMAL HIGH (ref 70–99)
Glucose-Capillary: 98 mg/dL (ref 70–99)

## 2020-03-08 LAB — HEPARIN LEVEL (UNFRACTIONATED)
Heparin Unfractionated: 0.51 IU/mL (ref 0.30–0.70)
Heparin Unfractionated: 0.84 IU/mL — ABNORMAL HIGH (ref 0.30–0.70)

## 2020-03-08 LAB — CBC
HCT: 24.2 % — ABNORMAL LOW (ref 36.0–46.0)
Hemoglobin: 7.6 g/dL — ABNORMAL LOW (ref 12.0–15.0)
MCH: 30.9 pg (ref 26.0–34.0)
MCHC: 31.4 g/dL (ref 30.0–36.0)
MCV: 98.4 fL (ref 80.0–100.0)
Platelets: 115 10*3/uL — ABNORMAL LOW (ref 150–400)
RBC: 2.46 MIL/uL — ABNORMAL LOW (ref 3.87–5.11)
RDW: 22.7 % — ABNORMAL HIGH (ref 11.5–15.5)
WBC: 13.6 10*3/uL — ABNORMAL HIGH (ref 4.0–10.5)
nRBC: 1 % — ABNORMAL HIGH (ref 0.0–0.2)

## 2020-03-08 LAB — RENAL FUNCTION PANEL
Albumin: 1.8 g/dL — ABNORMAL LOW (ref 3.5–5.0)
Anion gap: 12 (ref 5–15)
BUN: 25 mg/dL — ABNORMAL HIGH (ref 6–20)
CO2: 22 mmol/L (ref 22–32)
Calcium: 8.9 mg/dL (ref 8.9–10.3)
Chloride: 103 mmol/L (ref 98–111)
Creatinine, Ser: 1.84 mg/dL — ABNORMAL HIGH (ref 0.44–1.00)
GFR calc non Af Amer: 32 mL/min — ABNORMAL LOW (ref >60)
Glucose, Bld: 180 mg/dL — ABNORMAL HIGH (ref 70–99)
Phosphorus: 2.6 mg/dL (ref 2.5–4.6)
Potassium: 4.5 mmol/L (ref 3.5–5.1)
Sodium: 137 mmol/L (ref 135–145)

## 2020-03-08 LAB — MAGNESIUM: Magnesium: 2.6 mg/dL — ABNORMAL HIGH (ref 1.7–2.4)

## 2020-03-08 MED ORDER — APIXABAN 2.5 MG PO TABS: 2.5000 mg | ORAL_TABLET | Freq: Two times a day (BID) | ORAL | Status: DC

## 2020-03-08 MED ORDER — APIXABAN 2.5 MG PO TABS
2.5000 mg | ORAL_TABLET | Freq: Two times a day (BID) | ORAL | Status: DC
  Administered 2020-03-08 – 2020-03-17 (×20): 2.5 mg
  Filled 2020-03-08 (×21): qty 1

## 2020-03-08 MED ORDER — HEPARIN SODIUM (PORCINE) 5000 UNIT/ML IJ SOLN
INTRAMUSCULAR | Status: AC
  Administered 2020-03-08: 5000 [IU]
  Filled 2020-03-08: qty 5

## 2020-03-08 MED ORDER — CHLORHEXIDINE GLUCONATE 0.12 % MT SOLN
OROMUCOSAL | Status: AC
  Filled 2020-03-08: qty 15

## 2020-03-08 NOTE — TOC Initial Note (Signed)
Transition of Care [facility]) - Initial/Assessment Note    Patient Details  Name: Wanda Ramirez MRN: [id number] Date of Birth: 05-21-1971  [facility]) CM/SW Contact:    Verdell Carmine, RN Phone Number: 03/08/2020, 5:19 PM  Clinical Narrative:                 Admitted with fistula clotted, COVID positive increase oxygen demand requiring ventilation, CRRT. Now extubated as of 10/6. Still tenuous Respiratory wise, on bipap and precedex, on pressors for BP.  Still will be several ICU days before more stable.  CM will continue to follow for needs.   Expected Discharge Plan: Home/Self Care Barriers to Discharge: Continued Medical Work up   Patient Goals and CMS Choice        Expected Discharge Plan and Services Expected Discharge Plan: Home/Self Care   Discharge Planning Services: CM Consult   Living arrangements for the past 2 months: Apartment                                      Prior Living Arrangements/Services Living arrangements for the past 2 months: Apartment   Patient language and need for interpreter reviewed:: Yes        Need for Family Participation in Patient Care: Yes (Comment) Care giver support system in place?: Yes (comment)   Criminal Activity/Legal Involvement Pertinent to Current Situation/Hospitalization: No - Comment as needed  Activities of Daily Living Home Assistive Devices/Equipment: None ADL Screening (condition at time of admission) Patient's cognitive ability adequate to safely complete daily activities?: No Is the patient deaf or have difficulty hearing?: No Does the patient have difficulty seeing, even when wearing glasses/contacts?: No Does the patient have difficulty concentrating, remembering, or making decisions?: No Patient able to express need for assistance with ADLs?: No Does the patient have difficulty dressing or bathing?: No Independently performs ADLs?: No Does the patient have difficulty walking or  climbing stairs?: Yes Weakness of Legs: Both Weakness of Arms/Hands: Both  Permission Sought/Granted                  Emotional Assessment Appearance:: Appears older than stated age     Orientation: : Fluctuating Orientation (Suspected and/or reported Sundowners) (on precedex for agitation) Alcohol / Substance Use: Not Applicable Psych Involvement: No (comment)  Admission diagnosis:  End stage renal disease on dialysis ([place]) [N18.6, Z99.2] COVID-19 [U07.1] Patient Active Problem List   Diagnosis Date Noted  . Pressure injury of skin 03/07/2020  . ARDS (adult respiratory distress syndrome) ([place])   . Acute respiratory failure with hypoxia ([place])   . Encephalopathy acute   . Bacterial pneumonia   . COVID-19 02/14/2020  . Weakness 03/17/2016  . Hypotension 03/17/2016  . Leukocytosis 03/17/2016  . Hyperlipidemia 03/17/2016  . Peripheral neuropathy 03/17/2016  . Precordial chest pain 03/17/2016  . End stage renal disease on dialysis ([facility]) 03/16/2016  . Diabetes mellitus type 2 in obese ([place]) 03/16/2016  . Essential hypertension 03/16/2016  . Hypothyroidism 03/16/2016  . Glaucoma 03/16/2016  . GERD (gastroesophageal reflux disease) 03/16/2016  . OSA (obstructive sleep apnea) 03/16/2016   PCP:  Welford Roche, NP Pharmacy:   [place], [place] [address] Phone: [phone] Fax: [phone]  [id number] [place], [place]  [place] [address] Phone: [phone] Fax: [phone]     Social Determinants of Health (SDOH) Interventions    Readmission Risk Interventions No flowsheet data found.

## 2020-03-08 NOTE — Progress Notes (Signed)
ANTICOAGULATION CONSULT NOTE  Pharmacy Consult for heparin Indication: r/o VTE  Heparin Dosing Weight: 78.1 kg  Labs: Recent Labs    03/06/20 0432 03/06/20 1713 03/07/20 0458 03/07/20 0458 03/07/20 1231 03/07/20 1547 03/07/20 1816 03/08/20 0432  HGB 8.2*  --  7.6*   < >  --   --  9.2* 7.6*  HCT 26.0*  --  24.8*  --   --   --  27.0* 24.2*  PLT 63*  --  81*  --   --   --   --  115*  HEPARINUNFRC 0.68  --  0.12*  --  0.24*  --   --  0.51  CREATININE  --    < > 2.02*  --   --  1.92*  --  1.84*   < > = values in this interval not displayed.    Assessment: 24 yof with ESRD on HD presenting COVID+, admitted to ICU and transitioned to CRRT. HIT ruled out. Patient has been transitioned back to IV Heparin.   Heparin level improved but remains subtherapeutic at 0.24 after rate increase this morning. Hg down a bit to 7.6. Platelets trend up to 81. No bleeding or issues with infusion per discussion with RN.  10/7 AM update:  Heparin level at goal Plts up some this AM  Goal of Therapy:  Heparin level 0.3-0.7 units/ml Monitor platelets by anticoagulation protocol: Yes   Plan:  Cont heparin 1450 units/hr 1200 confirmatory heparin level   Narda Bonds, PharmD, BCPS Clinical Pharmacist Phone: [phone]

## 2020-03-08 NOTE — Progress Notes (Signed)
Assisted tele visit to patient with family member.  Wanda Ramirez P, RN  

## 2020-03-08 NOTE — Progress Notes (Signed)
NAME:  Wanda Ramirez, MRN:  [id number], DOB:  [birth year], LOS: 64 ADMISSION DATE:  02/13/2020, CONSULTATION DATE: 02/24/2020 REFERRING MD: Dr. Heber [place], CHIEF COMPLAINT: Increasing supplemental oxygen demand  Brief History   Wanda Ramirez is a 49 year old female with a past medical history significant for end-stage renal disease on hemodialysis MWF, chronic hypoxic respiratory failure on 3 L nasal cannula at baseline, sleep apnea, diabetes, hypertension, hyperlipidemia, hypothyroidism, and asthma who presented to the emergency department with complaints of worsening shortness of breath, dry cough, generalized body aches, and loose stools.  Patient was diagnosed 9/9 with Covid pneumonia.  Initially she was able to maintain outpatient status but when symptoms worsened 9/14 she presented to the emergency department.  Patient reported dyspnea and weakness worsened to the point where she was unable to ambulate effectively.  She also reports she was unable to maintain adequate oral intake.  Patient received the moderna vaccines in March and April of this year.  Patient presented initially febrile with a temperature of 102.9 and mildly tachypneic with all other vital signs within normal limits.  She was initially managed on 4 L nasal cannula.  Lab work significant for NA 130, K3.8, glucose 454, anion gap 21, lactic acid 1.8, LDH 296, ferritin 6690, CRP 13.3, and procalcitonin 10.51  On review of medical record it appears patient has progressively required increased supplemental oxygen requirement.  Afternoon of 9/24 patient had episode of desaturation requiring further increase in supplemental oxygen.  On chart assessment it appears patient has been placed on 40 L with reported increased work of breathing prompting PCCM consultation  Past Medical History  Sleep apnea Morbid obesity Hypothyroidism Hypertension GERD  End-stage renal disease on HD MWF Diabetes Asthma   Immunization History   Administered Date(s) Administered  . Influenza,inj,Quad PF,6+ Mos 02/09/2018     Significant Hospital Events   Admitted 9/14  9/24 - Lying in bed on side currently undergoing iHD,  She is lethargic but will arouse to verbal stimuli but quickly falls back to sleep.  RN states patient has had progressive decline over the last 12hrs 9/25 - -currently in medical ICU to [facility].  Bed 12.  She has been on BiPAP with Precedex overnight.  This morning on the low-dose of Precedex she got agitated.?  Also had melena.  She also desaturated.  Nursing then increase her Precedex and since then she has been more stable.  Yesterday dialysis was cut short.  9/26 -unable to run CRRT with prone ventilation.  Resume supine ventilation but still unable to draw blood back. 9/27 transferred to 58m. IR replaced HD catheter 10/1 on CRRT and vasopressors  Consults:  PCCM  Procedures:  9/23 IR guided tunneled HD cath placement - left subclavian 10/6 extubated  Significant Diagnostic Tests:  Pulmonary VQ scan at 9/21 > negative  Micro Data:  See micro tab  Antimicrobials:  Cefepime 9/24 > Azithromycin 9/21 > 9/26 Remdesivir completed 9/17 > 9/19 Tocilizumab x1  Interim history/subjective:  Extubated yesterday. Had some stridor and increased WOB so placed on BIPAP and dexamethasone given. Stabilized on this.  Objective   Blood pressure (!) 162/79, pulse 74, temperature 99.1 F (37.3 C), resp. rate 19, height 5\' 2"  (1.575 m), weight 104.9 kg, SpO2 97 %.    Vent Mode: PCV FiO2 (%):  [40 %-50 %] 40 % Set Rate:  [15 bmp] 15 bmp PEEP:  [6 cmH20] 6 cmH20   Intake/Output Summary (Last 24 hours) at 03/08/2020 0803 Last data filed at 03/08/2020  0800 Gross per 24 hour  Intake 1228.81 ml  Output 740 ml  Net 488.81 ml   Filed Weights   03/04/20 0500 03/05/20 0200 03/07/20 1700  Weight: 109.2 kg 108 kg 104.9 kg   Constitutional: middle aged woman in no acute distress Eyes: eyes are anicteric,  reactive to light Ears, nose, mouth, and throat: BIPAP in place with good seal Cardiovascular: heart sounds are regular, ext are warm to touch. Global edema improved. Respiratory: rattling upper airway sounds, borderline tachypneic Gastrointestinal: abdomen is soft with + BS Skin: No rashes, normal turgor Neurologic: follows commands x 4, weak Psychiatric: RASS 0, anxious  CBG a little high Afebrile WBC stable H/H stable BMP looks good on CRRT Plts improved  Resolved Hospital Problem list   Mixed metabolic/respiratory acidosis  Acute on chronic Anemia likely component of chronic disease with blood loss no obvious signs of bleeding s/p 2 u prbc on 9/28 - Stable, monitor  Shock, undifferentiated, improved Patient's echocardiogram shows RV overload.  Could be RV stunning from PVR increase due to COVID +/- septic effect of COVID, regardless, improved. She is off antibiotics - Resolved  Assessment & Plan:   Acute hypoxic/hypercapnic respiratory failure due to ARDS secondary to COVID 19 Pneumonia Patient has completed steroid, remdesivir, immunomodulating therapy. Extubated 10/6 - Work on weaning BIPAP today  ESRD on HD  On CRRT, tolerating well - Hopefully can transition to [facility], defer to nephrology  Poorly controlled diabetes with hyperglycemia, A1c 12.8% on admission -Better controlled with SSI and long-acting  Thrombocytopenia, at risk VTE- HIT Ab neg, low 4T score, resumed heparin drip 10/4 I would keep her on more aggressive anticoagulation in setting of RV dysfunction, high D dimer, and recent COVID.  No definitive evidence of VTE argues against full AC. - Start eliquis 2.5mg  BID, stop heparin gtt, would keep her on this for about 1 month  Best practice:  Diet: TF, swallow screen if weans from BIPAP Pain/Anxiety/Delirium protocol (if indicated): off VAP protocol (if indicated): off DVT prophylaxis: see above GI prophylaxis: PPI Glucose control: see  above Mobility: Up to chair if weans from BIPAP Code Status: Full Family Communication: updated daughter by phone 10/6 Disposition: ICU pending getting off CRRT and more stable respiratory status  The patient is critically ill with multiple organ systems failure and requires high complexity decision making for assessment and support, frequent evaluation and titration of therapies, application of advanced monitoring technologies and extensive interpretation of multiple databases. Critical Care Time devoted to patient care services described in this note independent of APP/resident time (if applicable)  is 33 minutes.   [name] MD [facility] 03/08/2020 8:03 AM Personal pager: [phone] If unanswered, please page CCM On-call: [phone]

## 2020-03-08 NOTE — Progress Notes (Signed)
[facility] Progress Note  Subjective:  Pt seen in ICU, extubated, +secretions, alert  Vitals:   03/08/20 1100 03/08/20 1200 03/08/20 1300 03/08/20 1400  BP:      Pulse: (!) 105 (!) 110 99 94  Resp: (!) 30 (!) 24 (!) 31 (!) 28  Temp: 98.6 F (37 C) 98.6 F (37 C) 98.4 F (36.9 C) 98.2 F (36.8 C)  TempSrc:      SpO2: (!) 86% (!) 84% (!) 89% (!) 87%  Weight:      Height:        Exam:    alert, nad , extubated, +oral secretions   no jvd  Chest bilat post crackles throughout  Cor reg no RG  Abd soft ntnd no ascites   Ext no LE or UE edema   NF, nonofocal, tired     L AVF   OP HD: TTS  4h 61mn 450/800  2/2.25 bath  111.5kg  Hep 2000  L AVF  - darbe 50 ug q week, last 9/7  - calc 1.0 tiw  - 9/9 Hb 10.0, tsat 22%  Assessment/ Plan: # COVID pna w/ acute on chronic hypoxic respiratory failure:  dexamethasone, status post tocilizumab and remdesivir.  Severe CXR changes.    # Acute hypoxic resp failure  - extubated on 10/6  - per primary team  # ESRD: usual HD is TTS.   - off pressors and extubated, will dc CRRT - plan iHD , next HD on Sat  # Clotted LUA AVF: note TDC placed by IR 9/11. Will need new perm access at some point when pt is more stable.  Tunneled catheter exchanged on 9/27 with IR.   # Septic shock - 2/2 covid  - resolved  # Volume status: no vol excess on exam, wt's down 7 kg from dry wt  # Anemia of Chronic Kidney Disease: aranesp increased to 150 mcg for 9/28 and weekly for now  # Secondary Hyperparathyroidism/Hyperphosphatemia:  while on CRRT discontinued Auryxia and temporarily discontinued Sensipar 60 mg every other day  #DM2 - management per primary team  RKelly Splinter MD 03/08/2020, 2:09 PM           Recent Labs  Lab 03/07/20 0458 03/07/20 1547 03/07/20 1816 03/08/20 0432  K   < > 4.8 4.9 4.5  BUN  --  23*  --  25*  CREATININE  --  1.92*  --  1.84*  CALCIUM  --  8.7*  --  8.9  PHOS  --  4.5  --  2.6  HGB   <  >  --  9.2* 7.6*   < > = values in this interval not displayed.   Inpatient medications: . apixaban  2.5 mg Per Tube BID  . B-complex with vitamin C  1 tablet Per Tube Daily  . chlorhexidine gluconate (MEDLINE KIT)  15 mL Mouth Rinse BID  . Chlorhexidine Gluconate Cloth  6 each Topical Q0600  . darbepoetin (ARANESP) injection - DIALYSIS  150 mcg Intravenous Q Tue-HD  . dexamethasone (DECADRON) injection  4 mg Intravenous Q6H  . docusate  100 mg Per Tube BID  . feeding supplement (PROSource TF)  45 mL Per Tube QID  . insulin aspart  3-9 Units Subcutaneous Q4H  . insulin aspart  5 Units Subcutaneous Q4H  . insulin detemir  10 Units Subcutaneous Q12H  . levothyroxine  125 mcg Per Tube Q0600  . mouth rinse  15 mL Mouth Rinse BID  .  pantoprazole  40 mg Intravenous Q12H  . polyethylene glycol  17 g Per Tube BID  . sodium chloride flush  10-40 mL Intracatheter Q12H  . sodium chloride flush  3 mL Intravenous Q12H   . sodium chloride 10 mL/hr at 03/08/20 1400  . sodium chloride    . dexmedetomidine (PRECEDEX) IV infusion 1.2 mcg/kg/hr (03/08/20 1400)  . dextrose    . feeding supplement (VITAL 1.5 CAL) 1,000 mL (03/07/20 1823)  . fentaNYL infusion INTRAVENOUS     sodium chloride, sodium chloride, acetaminophen (TYLENOL) oral liquid 160 mg/5 mL, albuterol, dextrose, fentaNYL, fentaNYL (SUBLIMAZE) injection, influenza vac split quadrivalent PF, lip balm, sodium chloride flush, sodium chloride flush

## 2020-03-09 ENCOUNTER — Inpatient Hospital Stay (HOSPITAL_COMMUNITY): Payer: Medicaid Other

## 2020-03-09 DIAGNOSIS — J9601 Acute respiratory failure with hypoxia: Secondary | ICD-10-CM | POA: Diagnosis not present

## 2020-03-09 DIAGNOSIS — U071 COVID-19: Secondary | ICD-10-CM | POA: Diagnosis not present

## 2020-03-09 LAB — POCT I-STAT 7, (LYTES, BLD GAS, ICA,H+H)
Acid-Base Excess: 0 mmol/L (ref 0.0–2.0)
Acid-Base Excess: 1 mmol/L (ref 0.0–2.0)
Bicarbonate: 27.7 mmol/L (ref 20.0–28.0)
Bicarbonate: 26.6 mmol/L (ref 20.0–28.0)
Calcium, Ion: 1.27 mmol/L (ref 1.15–1.40)
Calcium, Ion: 1.27 mmol/L (ref 1.15–1.40)
HCT: 22 % — ABNORMAL LOW (ref 36.0–46.0)
HCT: 21 % — ABNORMAL LOW (ref 36.0–46.0)
Hemoglobin: 7.5 g/dL — ABNORMAL LOW (ref 12.0–15.0)
Hemoglobin: 7.1 g/dL — ABNORMAL LOW (ref 12.0–15.0)
O2 Saturation: 98 %
O2 Saturation: 98 %
Patient temperature: 103.2
Patient temperature: 100.3
Potassium: 5.2 mmol/L — ABNORMAL HIGH (ref 3.5–5.1)
Potassium: 5.3 mmol/L — ABNORMAL HIGH (ref 3.5–5.1)
Sodium: 138 mmol/L (ref 135–145)
Sodium: 138 mmol/L (ref 135–145)
TCO2: 30 mmol/L (ref 22–32)
TCO2: 28 mmol/L (ref 22–32)
pCO2 arterial: 74.4 mmHg (ref 32.0–48.0)
pCO2 arterial: 49.1 mmHg — ABNORMAL HIGH (ref 32.0–48.0)
pH, Arterial: 7.193 — CL (ref 7.350–7.450)
pH, Arterial: 7.346 — ABNORMAL LOW (ref 7.350–7.450)
pO2, Arterial: 142 mmHg — ABNORMAL HIGH (ref 83.0–108.0)
pO2, Arterial: 116 mmHg — ABNORMAL HIGH (ref 83.0–108.0)

## 2020-03-09 LAB — CBC
HCT: 24.3 % — ABNORMAL LOW (ref 36.0–46.0)
Hemoglobin: 7.5 g/dL — ABNORMAL LOW (ref 12.0–15.0)
MCH: 30.2 pg (ref 26.0–34.0)
MCHC: 30.9 g/dL (ref 30.0–36.0)
MCV: 98 fL (ref 80.0–100.0)
Platelets: 142 10*3/uL — ABNORMAL LOW (ref 150–400)
RBC: 2.48 MIL/uL — ABNORMAL LOW (ref 3.87–5.11)
RDW: 21.8 % — ABNORMAL HIGH (ref 11.5–15.5)
WBC: 11.2 10*3/uL — ABNORMAL HIGH (ref 4.0–10.5)
nRBC: 0.4 % — ABNORMAL HIGH (ref 0.0–0.2)

## 2020-03-09 LAB — GLUCOSE, CAPILLARY
Glucose-Capillary: 152 mg/dL — ABNORMAL HIGH (ref 70–99)
Glucose-Capillary: 166 mg/dL — ABNORMAL HIGH (ref 70–99)
Glucose-Capillary: 219 mg/dL — ABNORMAL HIGH (ref 70–99)
Glucose-Capillary: 231 mg/dL — ABNORMAL HIGH (ref 70–99)
Glucose-Capillary: 259 mg/dL — ABNORMAL HIGH (ref 70–99)
Glucose-Capillary: 316 mg/dL — ABNORMAL HIGH (ref 70–99)

## 2020-03-09 LAB — PROCALCITONIN: Procalcitonin: 4.36 ng/mL

## 2020-03-09 LAB — MAGNESIUM: Magnesium: 2.7 mg/dL — ABNORMAL HIGH (ref 1.7–2.4)

## 2020-03-09 MED ORDER — INSULIN ASPART 100 UNIT/ML ~~LOC~~ SOLN
0.0000 [IU] | SUBCUTANEOUS | Status: DC
  Administered 2020-03-10: 8 [IU] via SUBCUTANEOUS
  Administered 2020-03-10: 3 [IU] via SUBCUTANEOUS
  Administered 2020-03-10: 8 [IU] via SUBCUTANEOUS
  Administered 2020-03-10: 11 [IU] via SUBCUTANEOUS
  Administered 2020-03-10: 5 [IU] via SUBCUTANEOUS

## 2020-03-09 MED ORDER — SODIUM BICARBONATE 8.4 % IV SOLN
INTRAVENOUS | Status: AC
  Administered 2020-03-09: 50 meq via INTRAVENOUS
  Filled 2020-03-09: qty 50

## 2020-03-09 MED ORDER — DOCUSATE SODIUM 50 MG/5ML PO LIQD: 100.0000 mg | Freq: Two times a day (BID) | ORAL | Status: DC

## 2020-03-09 MED ORDER — VECURONIUM BROMIDE 10 MG IV SOLR
6.0000 mg | Freq: Once | INTRAVENOUS | Status: AC
  Administered 2020-03-09: 6 mg via INTRAVENOUS

## 2020-03-09 MED ORDER — VANCOMYCIN VARIABLE DOSE PER UNSTABLE RENAL FUNCTION (PHARMACIST DOSING)
Status: DC
  Filled 2020-03-09: qty 1

## 2020-03-09 MED ORDER — LACTATED RINGERS IV BOLUS
1000.0000 mL | Freq: Once | INTRAVENOUS | Status: AC
  Administered 2020-03-09: 1000 mL via INTRAVENOUS

## 2020-03-09 MED ORDER — FENTANYL CITRATE (PF) 100 MCG/2ML IJ SOLN
INTRAMUSCULAR | Status: AC
  Administered 2020-03-09: 100 ug via INTRAVENOUS
  Filled 2020-03-09: qty 4

## 2020-03-09 MED ORDER — FENTANYL CITRATE (PF) 100 MCG/2ML IJ SOLN: 100.0000 ug | Freq: Once | INTRAMUSCULAR | Status: AC

## 2020-03-09 MED ORDER — VANCOMYCIN HCL 10 G IV SOLR
2250.0000 mg | Freq: Once | INTRAVENOUS | Status: AC
  Administered 2020-03-09: 2250 mg via INTRAVENOUS
  Filled 2020-03-09: qty 2250

## 2020-03-09 MED ORDER — MIDAZOLAM HCL 2 MG/2ML IJ SOLN
1.0000 mg | INTRAMUSCULAR | Status: DC | PRN
  Administered 2020-03-09: 4 mg via INTRAVENOUS
  Administered 2020-03-10 – 2020-03-13 (×4): 2 mg via INTRAVENOUS
  Filled 2020-03-09: qty 4
  Filled 2020-03-09: qty 2

## 2020-03-09 MED ORDER — ETOMIDATE 2 MG/ML IV SOLN
20.0000 mg | Freq: Once | INTRAVENOUS | Status: AC
  Administered 2020-03-09: 20 mg via INTRAVENOUS

## 2020-03-09 MED ORDER — NOREPINEPHRINE 16 MG/250ML-% IV SOLN
0.0000 ug/min | INTRAVENOUS | Status: DC
  Administered 2020-03-09: 4 ug/min via INTRAVENOUS
  Administered 2020-03-10: 10 ug/min via INTRAVENOUS
  Administered 2020-03-12: 8 ug/min via INTRAVENOUS
  Filled 2020-03-09 (×3): qty 250

## 2020-03-09 MED ORDER — PIPERACILLIN-TAZOBACTAM 3.375 G IVPB 30 MIN
3.3750 g | Freq: Once | INTRAVENOUS | Status: AC
  Administered 2020-03-09: 3.375 g via INTRAVENOUS
  Filled 2020-03-09: qty 50

## 2020-03-09 MED ORDER — MIDAZOLAM HCL (PF) 5 MG/ML IJ SOLN: 1.0000 mg | INTRAMUSCULAR | Status: DC | PRN

## 2020-03-09 MED ORDER — PHENYLEPHRINE 40 MCG/ML (10ML) SYRINGE FOR IV PUSH (FOR BLOOD PRESSURE SUPPORT)
PREFILLED_SYRINGE | INTRAVENOUS | Status: AC
  Filled 2020-03-09: qty 10

## 2020-03-09 MED ORDER — MIDAZOLAM HCL 2 MG/2ML IJ SOLN
INTRAMUSCULAR | Status: AC
  Administered 2020-03-09: 2 mg via INTRAVENOUS
  Filled 2020-03-09: qty 4

## 2020-03-09 MED ORDER — PANTOPRAZOLE SODIUM 40 MG PO PACK
40.0000 mg | PACK | Freq: Two times a day (BID) | ORAL | Status: DC
  Administered 2020-03-09 – 2020-04-23 (×89): 40 mg
  Filled 2020-03-09 (×97): qty 20

## 2020-03-09 MED ORDER — PIPERACILLIN-TAZOBACTAM IN DEX 2-0.25 GM/50ML IV SOLN
2.2500 g | Freq: Three times a day (TID) | INTRAVENOUS | Status: DC
  Administered 2020-03-10 (×2): 2.25 g via INTRAVENOUS
  Filled 2020-03-09 (×3): qty 50

## 2020-03-09 MED ORDER — VECURONIUM BROMIDE 10 MG IV SOLR
INTRAVENOUS | Status: AC
  Filled 2020-03-09: qty 10

## 2020-03-09 MED ORDER — MIDAZOLAM HCL 2 MG/2ML IJ SOLN: 2.0000 mg | Freq: Once | INTRAMUSCULAR | Status: AC

## 2020-03-09 MED ORDER — MIDAZOLAM 50MG/50ML (1MG/ML) PREMIX INFUSION
1.0000 mg/h | INTRAVENOUS | Status: DC
  Administered 2020-03-09: 4 mg/h via INTRAVENOUS
  Administered 2020-03-09: 6 mg/h via INTRAVENOUS
  Administered 2020-03-09: 7 mg/h via INTRAVENOUS
  Administered 2020-03-10: 8 mg/h via INTRAVENOUS
  Administered 2020-03-10: 7 mg/h via INTRAVENOUS
  Administered 2020-03-10 – 2020-03-12 (×6): 8 mg/h via INTRAVENOUS
  Administered 2020-03-12 (×2): 6 mg/h via INTRAVENOUS
  Filled 2020-03-09 (×13): qty 50

## 2020-03-09 MED ORDER — POLYETHYLENE GLYCOL 3350 17 G PO PACK: 17.0000 g | PACK | Freq: Every day | ORAL | Status: DC

## 2020-03-09 MED ORDER — SODIUM BICARBONATE 8.4 % IV SOLN: 50.0000 meq | Freq: Once | INTRAVENOUS | Status: AC

## 2020-03-09 MED ORDER — ROCURONIUM BROMIDE 50 MG/5ML IV SOLN
50.0000 mg | Freq: Once | INTRAVENOUS | Status: AC
  Administered 2020-03-09: 50 mg via INTRAVENOUS

## 2020-03-09 MED ORDER — MIDAZOLAM HCL 2 MG/2ML IJ SOLN
INTRAMUSCULAR | Status: AC
  Administered 2020-03-09: 2 mg via INTRAVENOUS
  Filled 2020-03-09: qty 2

## 2020-03-09 NOTE — Progress Notes (Signed)
Critical ABG results given to Dr Chase Caller PH-7.19 PCO2-74.4 PO2-142 HCO3-27.7

## 2020-03-09 NOTE — Progress Notes (Addendum)
Fever 103  Plan Pan culture Check lactaet Check pct Start vanc and zosyn - high risk VAP/HCAP   additiona 15 min ccm discussing with RN etc.,  SIGNATURE    Dr. Brand Males, M.D., F.C.C.P,  Pulmonary and Critical Care Medicine Staff Physician, [place] Director - Interstitial Lung Disease  Program  P[facility] [place] [facility] [place], [place], [address]  Pager: [phone], If no answer  OR between  19:00-7:00h: page [phone] Telephone (clinical office): [phone] Telephone (research): [phone]  2:02 PM 03/09/2020    Allergies  Allergen Reactions  . Hydrocodone Itching and Nausea And Vomiting       Anti-infectives (From admission, onward)   Start     Dose/Rate Route Frequency Ordered Stop   02/28/20 2200  ceFEPIme (MAXIPIME) 1 g in sodium chloride 0.9 % 100 mL IVPB        1 g 200 mL/hr over 30 Minutes Intravenous Every 12 hours 02/28/20 1305 02/29/20 2220   02/28/20 0907  ceFEPIme (MAXIPIME) 1 g in sodium chloride 0.9 % 100 mL IVPB  Status:  Discontinued        1 g 200 mL/hr over 30 Minutes Intravenous Every 24 hours 02/27/20 0939 02/28/20 1305   02/26/20 2200  ceFEPIme (MAXIPIME) 2 g in sodium chloride 0.9 % 100 mL IVPB  Status:  Discontinued        2 g 200 mL/hr over 30 Minutes Intravenous Every 12 hours 02/26/20 1519 02/27/20 0939   02/25/20 1200  ceFEPIme (MAXIPIME) 2 g in sodium chloride 0.9 % 100 mL IVPB  Status:  Discontinued        2 g 200 mL/hr over 30 Minutes Intravenous Every T-Th-Sa (Hemodialysis) 02/25/20 0955 02/26/20 1519   02/24/20 1904  azithromycin (ZITHROMAX) 500 mg in sodium chloride 0.9 % 250 mL IVPB  Status:  Discontinued        500 mg 250 mL/hr over 60 Minutes Intravenous Every 24 hours 02/24/20 1904 02/26/20 0815   02/24/20 1900  azithromycin (ZITHROMAX) 250 mg in dextrose 5 % 125 mL IVPB  Status:  Discontinued        250 mg 125 mL/hr over 60 Minutes Intravenous  Every 24 hours 02/24/20 1845 02/24/20 1904   02/24/20 1800  ceFEPIme (MAXIPIME) 2 g in sodium chloride 0.9 % 100 mL IVPB  Status:  Discontinued        2 g 200 mL/hr over 30 Minutes Intravenous Every Fri (Hemodialysis) 02/24/20 1326 02/25/20 0942   02/22/20 1745  ceFAZolin (ANCEF) IVPB 2g/100 mL premix        2 g 200 mL/hr over 30 Minutes Intravenous  Once 02/22/20 1744 02/22/20 1931   02/22/20 0600  ceFAZolin (ANCEF) IVPB 2g/100 mL premix        2 g 200 mL/hr over 30 Minutes Intravenous To Radiology 02/21/20 1658 02/22/20 1900   02/22/20 0000  ceFAZolin (ANCEF) IVPB 1 g/50 mL premix  Status:  Discontinued        1 g 100 mL/hr over 30 Minutes Intravenous To Radiology 02/21/20 1645 02/21/20 1658   02/21/20 1800  azithromycin (ZITHROMAX) tablet 250 mg  Status:  Discontinued       "Followed by" Linked Group Details   250 mg Oral Daily-1800 02/20/20 1658 02/24/20 1852   02/21/20 1800  ceFEPIme (MAXIPIME) 2 g in sodium chloride 0.9 % 100 mL IVPB  Status:  Discontinued  2 g 200 mL/hr over 30 Minutes Intravenous Every T-Th-Sa (1800) 02/20/20 1737 02/24/20 1326   02/20/20 1800  azithromycin (ZITHROMAX) tablet 500 mg       "Followed by" Linked Group Details   500 mg Oral Daily 02/20/20 1658 02/20/20 1731   02/20/20 1745  ceFEPIme (MAXIPIME) 1 g in sodium chloride 0.9 % 100 mL IVPB        1 g 200 mL/hr over 30 Minutes Intravenous STAT 02/20/20 1737 02/21/20 0747   02/19/20 1145  remdesivir 100 mg in sodium chloride 0.9 % 100 mL IVPB        100 mg 200 mL/hr over 30 Minutes Intravenous Daily 02/19/20 1131 02/19/20 1443   02/15/20 1000  remdesivir 100 mg in sodium chloride 0.9 % 100 mL IVPB  Status:  Discontinued       "Followed by" Linked Group Details   100 mg 200 mL/hr over 30 Minutes Intravenous Daily 02/14/20 0944 02/14/20 0951   02/15/20 1000  remdesivir 100 mg in sodium chloride 0.9 % 100 mL IVPB        100 mg 200 mL/hr over 30 Minutes Intravenous Daily 02/14/20 0952 02/17/20 1102    02/14/20 1030  remdesivir 100 mg in sodium chloride 0.9 % 100 mL IVPB        100 mg 200 mL/hr over 30 Minutes Intravenous Every 30 min 02/14/20 0952 02/14/20 1129   02/14/20 0945  remdesivir 200 mg in sodium chloride 0.9% 250 mL IVPB  Status:  Discontinued       "Followed by" Linked Group Details   200 mg 580 mL/hr over 30 Minutes Intravenous Once 02/14/20 0944 02/14/20 0951

## 2020-03-09 NOTE — Procedures (Signed)
Intubation Procedure Note  Wanda Ramirez  [id number]  1971/04/26  Date:03/09/20  Time:12:50 PM   Provider Performing:Shikara Mcauliffe    Procedure: Intubation (31500)  Indication(s) Respiratory Failure  Consent Risks of the procedure as well as the alternatives and risks of each were explained to the patient and/or caregiver.  Verbal Consent for the procedure was obtained from the family  Anesthesia Etomidate, Versed, Fentanyl, Rocuronium and bic bolus   Time Out Verified patient identification, verified procedure, site/side was marked, verified correct patient position, special equipment/implants available, medications/allergies/relevant history reviewed, required imaging and test results available.   Sterile Technique Usual hand hygeine, masks, and gloves were used   Procedure Description Patient positioned in bed supine.  Sedation given as noted above.  Patient was intubated with endotracheal tube using Glidescope.  View was Grade 1 full glottis .  Number of attempts was 1.  Colorimetric CO2 detector was consistent with tracheal placement.   Complications/Tolerance None; patient tolerated the procedure well. Chest X-ray is ordered to verify placement.   EBL none   Specimen(s) None

## 2020-03-09 NOTE — Progress Notes (Signed)
NAME:  Wanda Ramirez, MRN:  [id number], DOB:  [birth year], LOS: 24 ADMISSION DATE:  02/13/2020, CONSULTATION DATE: 02/24/2020 REFERRING MD: Dr. Heber Karluk, CHIEF COMPLAINT: Increasing supplemental oxygen demand  Brief History   Wanda Ramirez is a 49 year old female with a past medical history significant for end-stage renal disease on hemodialysis MWF, chronic hypoxic respiratory failure on 3 L nasal cannula at baseline, sleep apnea, diabetes, hypertension, hyperlipidemia, hypothyroidism, and asthma who presented to the emergency department with complaints of worsening shortness of breath, dry cough, generalized body aches, and loose stools.  Patient was diagnosed 9/9 with Covid pneumonia.  Initially she was able to maintain outpatient status but when symptoms worsened 9/14 she presented to the emergency department.  Patient reported dyspnea and weakness worsened to the point where she was unable to ambulate effectively.  She also reports she was unable to maintain adequate oral intake.  Patient received the moderna vaccines in March and April of this year.  Patient presented initially febrile with a temperature of 102.9 and mildly tachypneic with all other vital signs within normal limits.  She was initially managed on 4 L nasal cannula.  Lab work significant for NA 130, K3.8, glucose 454, anion gap 21, lactic acid 1.8, LDH 296, ferritin 6690, CRP 13.3, and procalcitonin 10.51  On review of medical record it appears patient has progressively required increased supplemental oxygen requirement.  Afternoon of 9/24 patient had episode of desaturation requiring further increase in supplemental oxygen.  On chart assessment it appears patient has been placed on 40 L with reported increased work of breathing prompting PCCM consultation  Past Medical History  Sleep apnea Morbid obesity Hypothyroidism Hypertension GERD  End-stage renal disease on HD MWF Diabetes Asthma   Immunization History   Administered Date(s) Administered  . Influenza,inj,Quad PF,6+ Mos 02/09/2018     Significant Hospital Events   Admitted 9/14  9/24 - Lying in bed on side currently undergoing iHD,  She is lethargic but will arouse to verbal stimuli but quickly falls back to sleep.  RN states patient has had progressive decline over the last 12hrs 9/25 - -currently in medical ICU to [facility].  Bed 12.  She has been on BiPAP with Precedex overnight.  This morning on the low-dose of Precedex she got agitated.?  Also had melena.  She also desaturated.  Nursing then increase her Precedex and since then she has been more stable.  Yesterday dialysis was cut short.  9/26 -unable to run CRRT with prone ventilation.  Resume supine ventilation but still unable to draw blood back. 9/27 transferred to 77m. IR replaced HD catheter 10/1 on CRRT and vasopressors 10/6 - extubated 10/7 - ad some stridor and increased WOB so placed on BIPAP and dexamethasone given.  Consults:  PCCM  Procedures:  9/23 IR guided tunneled HD cath placement - left subclavian 10/6 extubated  Significant Diagnostic Tests:  Pulmonary VQ scan at 9/21 > negative  Micro Data:  See micro tab  Antimicrobials:  Cefepime 9/24 > Azithromycin 9/21 > 9/26 Remdesivir completed 9/17 > 9/19 Tocilizumab x1  Interim history/subjective:   10/8 -significant respiratory distress breathing 40 times a minute.  Not tolerating BiPAP.  RT and RN of clinical opinion the patient requires intubation  Objective   Blood pressure (!) 162/79, pulse (!) 114, temperature 99 F (37.2 C), temperature source Oral, resp. rate (!) 40, height 5\' 2"  (1.575 m), weight 104.9 kg, SpO2 93 %.    Vent Mode: BIPAP FiO2 (%):  [50 %] 50 %  Set Rate:  [15 bmp] 15 bmp PEEP:  [5 cmH20] 5 cmH20   Intake/Output Summary (Last 24 hours) at 03/09/2020 1204 Last data filed at 03/09/2020 0900 Gross per 24 hour  Intake 929.56 ml  Output 85 ml  Net 844.56 ml   Filed Weights    03/04/20 0500 03/05/20 0200 03/07/20 1700  Weight: 109.2 kg 108 kg 104.9 kg   Obese lady on the BiPAP.  She is alert.  She is able to not and wiggle her fingers and toes to command.  However she is in significant respiratory distress breathing around 40 times a minute despite the BiPAP.  FiO2 50% on BiPAP.  Paradoxical respiration slightly.   Resolved Hospital Problem list   Mixed metabolic/respiratory acidosis  Acute on chronic Anemia likely component of chronic disease with blood loss no obvious signs of bleeding s/p 2 u prbc on 9/28 - Stable, monitor  Shock, undifferentiated, improved Patient's echocardiogram shows RV overload.  Could be RV stunning from PVR increase due to COVID +/- septic effect of COVID, regardless, improved. She is off antibiotics - Resolved  Assessment & Plan:   Acute hypoxic/hypercapnic respiratory failure due to ARDS secondary to COVID 19 Pneumonia Patient has completed steroid, remdesivir, immunomodulating therapy. Extubated 10/6  03/09/2020: Significant respiratory distress indicative of requiring reintubation  Plan -Intubate and then connected the ventilator with a for PRVC support -Ventilator associated pneumonia bundle  ESRD on HD  - s/p CRRT ending 03/08/20  plan  transition to iHD, defer to nephrology -starts 03/10/20   Thrombocytopenia, at risk VTE- HIT Ab neg, low 4T score, resumed heparin drip 10/4 I would keep her on more aggressive anticoagulation in setting of RV dysfunction, high D dimer, and recent COVID.  No definitive evidence of VTE argues against full AC.  Plan - as laid out 03/08/20 by DR Tamala Julian - eliquis 2.5mg  BID,  would keep her on this for about 1 month  Anemia of critical illness   Plan  - - PRBC for hgb </= 6.9gm%    - exceptions are   -  if ACS susepcted/confirmed then transfuse for hgb </= 8.0gm%,  or    -  active bleeding with hemodynamic instability, then transfuse regardless of hemoglobin value   At at all times  try to transfuse 1 unit prbc as possible with exception of active hemorrhage    Poorly controlled diabetes with hyperglycemia, A1c 12.8% on admission -Better controlled with SSI and long-acting   Best practice:  Diet: TF, swallow screen if weans from BIPAP Pain/Anxiety/Delirium protocol (if indicated): off VAP protocol (if indicated): off DVT prophylaxis: see above GI prophylaxis: PPI Glucose control: see above Mobility: Up to chair if weans from BIPAP Code Status: Full  Family Communication: updated daughter by phone 10/6. Daughter Alayshia Marini -> updated and niformed of need to intubate  Disposition:  ICU. Marland KitchenCan go to non-covid ICU  ATTESTATION & SIGNATURE   The patient Wanda Ramirez is critically ill with multiple organ systems failure and requires high complexity decision making for assessment and support, frequent evaluation and titration of therapies, application of advanced monitoring technologies and extensive interpretation of multiple databases.   Critical Care Time devoted to patient care services described in this note is  45  Minutes. This time reflects time of care of this signee Dr Brand Males. This critical care time does not reflect procedure time, or teaching time or supervisory time of PA/NP/Med student/Med Resident etc but could involve care discussion time  Dr. Brand Males, M.D., [facility].C.P Pulmonary and Critical Care Medicine Staff Physician [facility] Pager: [phone], If no answer or between  15:00h - 7:00h: call [phone]  03/09/2020 12:04 PM      LABS    PULMONARY Recent Labs  Lab 03/03/20 0317 03/04/20 0927 03/07/20 1816  PHART 7.235* 7.408 7.366  PCO2ART 56.9* 37.5 41.7  PO2ART 74* 76* 71*  HCO3 23.8 23.7 24.0  TCO2 25 25 25   O2SAT 90.0 95.0 94.0    CBC Recent Labs  Lab 03/07/20 0458 03/07/20 0458 03/07/20 1816 03/08/20 0432 03/09/20 0521  HGB 7.6*   < > 9.2*  7.6* 7.5*  HCT 24.8*   < > 27.0* 24.2* 24.3*  WBC 11.0*  --   --  13.6* 11.2*  PLT 81*  --   --  115* 142*   < > = values in this interval not displayed.    COAGULATION No results for input(s): INR in the last 168 hours.  CARDIAC  No results for input(s): TROPONINI in the last 168 hours. No results for input(s): PROBNP in the last 168 hours.   CHEMISTRY Recent Labs  Lab 03/03/20 0211 03/03/20 0317 03/05/20 1703 03/05/20 1703 03/06/20 0432 03/06/20 1713 03/06/20 1713 03/07/20 0458 03/07/20 0458 03/07/20 1547 03/07/20 1547 03/07/20 1816 03/08/20 0432 03/09/20 0521  NA 137   < > 134*   < >  --  136  --  137  --  137  --  135 137  --   K 4.9   < > 4.6   < >  --  4.4   < > 4.0   < > 4.8   < > 4.9 4.5  --   CL 103   < > 102  --   --  102  --  104  --  102  --   --  103  --   CO2 22   < > 22  --   --  23  --  23  --  23  --   --  22  --   GLUCOSE 234*   < > 258*  --   --  166*  --  117*  --  179*  --   --  180*  --   BUN 38*   < > 31*  --   --  23*  --  23*  --  23*  --   --  25*  --   CREATININE 2.66*   < > 2.00*  --   --  2.12*  --  2.02*  --  1.92*  --   --  1.84*  --   CALCIUM 8.9   < > 8.6*  --   --  8.7*  --  8.7*  --  8.7*  --   --  8.9  --   MG 3.1*  --   --   --  2.7*  --   --  2.7*  --   --   --   --  2.6* 2.7*  PHOS 5.3*   < > 3.6   < > 3.4 3.7  --  3.4  --  4.5  --   --  2.6  --    < > = values in this interval not displayed.   Estimated Creatinine Clearance: 42 mL/min (A) (by C-G formula based on SCr of 1.84 mg/dL (H)).   LIVER  Recent Labs  Lab 03/05/20 1703 03/06/20 1713 03/07/20 0458 03/07/20 1547 03/08/20 0432  ALBUMIN 2.0* 1.9* 1.8* 1.8* 1.8*     INFECTIOUS No results for input(s): LATICACIDVEN, PROCALCITON in the last 168 hours.   ENDOCRINE CBG (last 3)  Recent Labs    03/09/20 0333 03/09/20 0737 03/09/20 1128  GLUCAP 231* 152* 166*         IMAGING x48h  - image(s) personally visualized  -   highlighted in bold No results  found.

## 2020-03-09 NOTE — Progress Notes (Signed)
[place] Progress Note Patient Name: Wanda Ramirez DOB: [birth year] MRN: [id number]   Date of Service  03/09/2020  HPI/Events of Note  Sub-optimal glycemic control.  eICU Interventions  SSI scale adjusted to optimize glycemic control.        Kerry Kass Humna [place] 03/09/2020, 9:44 PM

## 2020-03-09 NOTE — Progress Notes (Signed)
Pharmacy Antibiotic Note  Wanda Ramirez is a 49 y.o. female admitted on 02/13/2020 with pneumonia.  Pharmacy has been consulted for vancomycin/zosyn dosing. ESRD on HD TTS outpatient. Patient transitioned off CRRT yesterday with plan for iHD on 10/9.  Plan: Zosyn 3.375g IV (19min infusion) x1; then 2.25g IV q8h Vancomycin 2250mg  IV x1; then f/u Nephrology plans prior to entering vancomycin maintenance doses Monitor clinical progress, c/s, abx plan/LOT Pre-HD vancomycin level as indicated F/u HD schedule/tolerance inpatient   Height: 5\' 2"  (157.5 cm) Weight: 104.9 kg (231 lb 4.2 oz) IBW/kg (Calculated) : 50.1  Temp (24hrs), Avg:98.8 F (37.1 C), Min:97.3 F (36.3 C), Max:103.2 F (39.6 C)  Recent Labs  Lab 03/05/20 0450 03/05/20 0450 03/05/20 1703 03/06/20 0432 03/06/20 1713 03/07/20 0458 03/07/20 1547 03/08/20 0432 03/09/20 0521  WBC 11.6*  --   --  12.5*  --  11.0*  --  13.6* 11.2*  CREATININE 1.82*   < > 2.00*  --  2.12* 2.02* 1.92* 1.84*  --    < > = values in this interval not displayed.    Estimated Creatinine Clearance: 42 mL/min (A) (by C-G formula based on SCr of 1.84 mg/dL (H)).    Allergies  Allergen Reactions  . Hydrocodone Itching and Nausea And Vomiting    Antimicrobials this admission: 10/8 vancomycin >>  10/8 zosyn >>   Dose adjustments this admission:   Microbiology results:   Arturo Morton, PharmD, BCPS Please check AMION for all [place] contact numbers Clinical Pharmacist 03/09/2020 2:08 PM

## 2020-03-09 NOTE — Progress Notes (Signed)
Post intubation ABG with resp acidosis at 6cc./kg/IBW Levophed need Deeply sedated XXR with et tube at carina  Plan  - d/w RT and RR increased 35/min - increase Vt to 7cc/kg/IBW - retract ET tube 2cm - recheck abg   Additional 30 mn ccm time     SIGNATURE    Dr. Brand Males, M.D., F.C.C.P,  Pulmonary and Critical Care Medicine Staff Physician, [place] Director - Interstitial Lung Disease  Program  Pulmonary [place] at [place], [place], [address]  Pager: [phone], If no answer  OR between  19:00-7:00h: page [phone] Telephone (clinical office): [phone] Telephone (research): [phone]  6:27 PM 03/09/2020    Results for FARIDA, MCREYNOLDS (MRN [id number]) as of 03/09/2020 18:17  Ref. Range 03/04/2020 09:27 03/07/2020 18:16 03/09/2020 15:10  pH, Arterial Latest Ref Range: 7.35 - 7.45  7.408 7.366 7.193 (LL)  pCO2 arterial Latest Ref Range: 32 - 48 mmHg 37.5 41.7 74.4 (HH)  pO2, Arterial Latest Ref Range: 83 - 108 mmHg 76 (L) 71 (L) 142 (H)   DG CHEST PORT 1 VIEW  Result Date: 03/09/2020 CLINICAL DATA:  COVID-19 positivity with ARDS EXAM: PORTABLE CHEST 1 VIEW COMPARISON:  02/27/2020 FINDINGS: Cardiac shadow is stable. Dialysis catheter, endotracheal tube, feeding catheter and right jugular central line are seen. The endotracheal tube lies at the level of the carina and should be withdrawn 2-3 cm. Patchy airspace opacity is again identified but now in a symmetrical fashion bilaterally consistent with the given clinical history of COVID-19 positivity. No bony abnormality is seen. IMPRESSION: Bilateral airspace opacities consistent with the given clinical history. Endotracheal tube now lies at the level of the carina and should be withdrawn 2-3 cm. These results will be called to the ordering clinician or representative by the Radiologist Assistant, and communication documented in the PACS or [employer]. Electronically Signed   By: Inez Catalina M.D.   On: 03/09/2020 16:45

## 2020-03-09 NOTE — Progress Notes (Signed)
Assisted tele visit to patient with daughter.  Sander Remedios Samson, RN  

## 2020-03-09 NOTE — Progress Notes (Signed)
[facility] Progress Note  Subjective:  Pt seen in ICU this am, now is re-intubated  Vitals:   03/09/20 1200 03/09/20 1246 03/09/20 1300 03/09/20 1324  BP:      Pulse: (!) 105  (!) 132   Resp: (!) 40  (!) 0   Temp:    (!) 103.2 F (39.6 C)  TempSrc:    Oral  SpO2: 93% 98% 99%   Weight:      Height:  '5\' 2"'  (1.575 m)      Exam:    alert, nad    no jvd  Chest bilat post crackles throughout  Cor reg no RG  Abd soft ntnd no ascites   Ext no LE or UE edema   NF, nonofocal, tired     L AVF   OP HD: TTS  4h 79mn 450/800  2/2.25 bath  111.5kg  Hep 2000  L AVF  - darbe 50 ug q week, last 9/7  - calc 1.0 tiw  - 9/9 Hb 10.0, tsat 22%  Assessment/ Plan: # COVID pna w/ acute on chronic hypoxic respiratory failure:  dexamethasone, status post tocilizumab and remdesivir.  Severe CXR changes.    # Acute hypoxic resp failure  - extubated on 10/6 > rintubated on 10/8 - per primary team  # ESRD: usual HD is TTS.   - sp CRRT 9/28- 10/7 - plan iHD tomorrow on schedule  # Clotted LUA AVF: note TDC placed by IR 9/11. Will need new perm access at some point when pt is more stable.  Tunneled catheter exchanged on 9/27 with IR.   # Septic shock - 2/2 covid  - resolved  # Volume status: no vol excess on exam, wt's down 7 kg from dry wt, low CVP's when last checked 2-3 days ago  # Anemia of Chronic Kidney Disease: aranesp increased to 150 mcg for 9/28 and weekly for now  # Secondary Hyperparathyroidism/Hyperphosphatemia:  while on CRRT discontinued Auryxia and temporarily discontinued Sensipar 60 mg every other day  #DM2 - management per primary team  RKelly Splinter MD 03/09/2020, 3:08 PM           Recent Labs  Lab 03/07/20 0458 03/07/20 1547 03/07/20 1816 03/07/20 1816 03/08/20 0432 03/09/20 0521  K   < > 4.8 4.9  --  4.5  --   BUN  --  23*  --   --  25*  --   CREATININE  --  1.92*  --   --  1.84*  --   CALCIUM  --  8.7*  --   --  8.9  --   PHOS  --   4.5  --   --  2.6  --   HGB   < >  --  9.2*   < > 7.6* 7.5*   < > = values in this interval not displayed.   Inpatient medications: . apixaban  2.5 mg Per Tube BID  . B-complex with vitamin C  1 tablet Per Tube Daily  . chlorhexidine gluconate (MEDLINE KIT)  15 mL Mouth Rinse BID  . Chlorhexidine Gluconate Cloth  6 each Topical Q0600  . darbepoetin (ARANESP) injection - DIALYSIS  150 mcg Intravenous Q Tue-HD  . docusate  100 mg Per Tube BID  . feeding supplement (PROSource TF)  45 mL Per Tube QID  . insulin aspart  3-9 Units Subcutaneous Q4H  . insulin aspart  5 Units Subcutaneous Q4H  . insulin detemir  10 Units  Subcutaneous Q12H  . levothyroxine  125 mcg Per Tube Q0600  . mouth rinse  15 mL Mouth Rinse BID  . pantoprazole sodium  40 mg Per Tube BID  . phenylephrine      . polyethylene glycol  17 g Per Tube BID  . sodium chloride flush  10-40 mL Intracatheter Q12H  . sodium chloride flush  3 mL Intravenous Q12H  . [START ON 03/10/2020] vancomycin variable dose per unstable renal function (pharmacist dosing)   Does not apply See admin instructions  . vecuronium       . sodium chloride 10 mL/hr at 03/09/20 1300  . sodium chloride    . dexmedetomidine (PRECEDEX) IV infusion 1.2 mcg/kg/hr (03/09/20 1419)  . dextrose    . feeding supplement (VITAL 1.5 CAL) 1,000 mL (03/09/20 1115)  . fentaNYL infusion INTRAVENOUS 200 mcg/hr (03/09/20 1300)  . midazolam 4 mg/hr (03/09/20 1418)  . piperacillin-tazobactam (ZOSYN)  IV    . piperacillin-tazobactam    . vancomycin 2,250 mg (03/09/20 1437)   sodium chloride, sodium chloride, acetaminophen (TYLENOL) oral liquid 160 mg/5 mL, albuterol, dextrose, fentaNYL, fentaNYL (SUBLIMAZE) injection, influenza vac split quadrivalent PF, lip balm, midazolam, sodium chloride flush, sodium chloride flush

## 2020-03-10 DIAGNOSIS — J80 Acute respiratory distress syndrome: Secondary | ICD-10-CM | POA: Diagnosis not present

## 2020-03-10 DIAGNOSIS — U071 COVID-19: Secondary | ICD-10-CM | POA: Diagnosis not present

## 2020-03-10 DIAGNOSIS — J9601 Acute respiratory failure with hypoxia: Secondary | ICD-10-CM | POA: Diagnosis not present

## 2020-03-10 LAB — RENAL FUNCTION PANEL
Albumin: 1.4 g/dL — ABNORMAL LOW (ref 3.5–5.0)
Anion gap: 13 (ref 5–15)
BUN: 65 mg/dL — ABNORMAL HIGH (ref 6–20)
CO2: 23 mmol/L (ref 22–32)
Calcium: 9.1 mg/dL (ref 8.9–10.3)
Chloride: 105 mmol/L (ref 98–111)
Creatinine, Ser: 4.96 mg/dL — ABNORMAL HIGH (ref 0.44–1.00)
GFR, Estimated: 10 mL/min — ABNORMAL LOW (ref >60)
Glucose, Bld: 263 mg/dL — ABNORMAL HIGH (ref 70–99)
Phosphorus: 5.6 mg/dL — ABNORMAL HIGH (ref 2.5–4.6)
Potassium: 5.1 mmol/L (ref 3.5–5.1)
Sodium: 141 mmol/L (ref 135–145)

## 2020-03-10 LAB — CBC
HCT: 20.5 % — ABNORMAL LOW (ref 36.0–46.0)
Hemoglobin: 6.4 g/dL — CL (ref 12.0–15.0)
MCH: 30.6 pg (ref 26.0–34.0)
MCHC: 31.2 g/dL (ref 30.0–36.0)
MCV: 98.1 fL (ref 80.0–100.0)
Platelets: 190 K/uL (ref 150–400)
RBC: 2.09 MIL/uL — ABNORMAL LOW (ref 3.87–5.11)
RDW: 20.7 % — ABNORMAL HIGH (ref 11.5–15.5)
WBC: 17.9 K/uL — ABNORMAL HIGH (ref 4.0–10.5)
nRBC: 0.1 % (ref 0.0–0.2)

## 2020-03-10 LAB — PROCALCITONIN: Procalcitonin: 8.39 ng/mL

## 2020-03-10 LAB — GLUCOSE, CAPILLARY
Glucose-Capillary: 251 mg/dL — ABNORMAL HIGH (ref 70–99)
Glucose-Capillary: 242 mg/dL — ABNORMAL HIGH (ref 70–99)
Glucose-Capillary: 178 mg/dL — ABNORMAL HIGH (ref 70–99)
Glucose-Capillary: 372 mg/dL — ABNORMAL HIGH (ref 70–99)
Glucose-Capillary: 352 mg/dL — ABNORMAL HIGH (ref 70–99)
Glucose-Capillary: 349 mg/dL — ABNORMAL HIGH (ref 70–99)
Glucose-Capillary: 293 mg/dL — ABNORMAL HIGH (ref 70–99)
Glucose-Capillary: 226 mg/dL — ABNORMAL HIGH (ref 70–99)
Glucose-Capillary: 194 mg/dL — ABNORMAL HIGH (ref 70–99)
Glucose-Capillary: 169 mg/dL — ABNORMAL HIGH (ref 70–99)

## 2020-03-10 LAB — LACTIC ACID, PLASMA: Lactic Acid, Venous: 1.7 mmol/L (ref 0.5–1.9)

## 2020-03-10 LAB — MAGNESIUM: Magnesium: 2.9 mg/dL — ABNORMAL HIGH (ref 1.7–2.4)

## 2020-03-10 LAB — HEMOGLOBIN AND HEMATOCRIT, BLOOD
HCT: 25.4 % — ABNORMAL LOW (ref 36.0–46.0)
Hemoglobin: 8.2 g/dL — ABNORMAL LOW (ref 12.0–15.0)

## 2020-03-10 LAB — PREPARE RBC (CROSSMATCH)

## 2020-03-10 MED ORDER — PIPERACILLIN-TAZOBACTAM 3.375 G IVPB
3.3750 g | Freq: Two times a day (BID) | INTRAVENOUS | Status: DC
  Filled 2020-03-10: qty 50

## 2020-03-10 MED ORDER — SODIUM CHLORIDE 0.9% IV SOLUTION: Freq: Once | INTRAVENOUS | Status: AC

## 2020-03-10 MED ORDER — DEXTROSE 50 % IV SOLN
0.0000 mL | INTRAVENOUS | Status: DC | PRN
  Administered 2020-03-16: 25 mL via INTRAVENOUS
  Administered 2020-03-18: 50 mL via INTRAVENOUS
  Filled 2020-03-10 (×2): qty 50

## 2020-03-10 MED ORDER — INSULIN REGULAR(HUMAN) IN NACL 100-0.9 UT/100ML-% IV SOLN
INTRAVENOUS | Status: AC
  Administered 2020-03-10: 11.5 [IU]/h via INTRAVENOUS
  Administered 2020-03-11: 6 [IU]/h via INTRAVENOUS
  Filled 2020-03-10 (×2): qty 100

## 2020-03-10 MED ORDER — VANCOMYCIN HCL IN DEXTROSE 1-5 GM/200ML-% IV SOLN
1000.0000 mg | Freq: Once | INTRAVENOUS | Status: AC
  Administered 2020-03-10: 1000 mg via INTRAVENOUS
  Filled 2020-03-10: qty 200

## 2020-03-10 MED ORDER — HEPARIN SODIUM (PORCINE) 1000 UNIT/ML IJ SOLN
INTRAMUSCULAR | Status: AC
  Administered 2020-03-10: 4200 [IU]
  Filled 2020-03-10: qty 4

## 2020-03-10 MED ORDER — HEPARIN SODIUM (PORCINE) 1000 UNIT/ML DIALYSIS
3500.0000 [IU] | INTRAMUSCULAR | Status: DC | PRN
  Administered 2020-03-22: 3500 [IU] via INTRAVENOUS_CENTRAL
  Filled 2020-03-10 (×4): qty 4

## 2020-03-10 NOTE — Progress Notes (Signed)
Pharmacy Antibiotic Note  Wanda Ramirez is a 49 y.o. female admitted on 02/13/2020 with pneumonia.    HD today  resp cx shows staph - pending suscept  Height: 5\' 2"  (157.5 cm) Weight: 105.2 kg (231 lb 14.8 oz) IBW/kg (Calculated) : 50.1  Temp (24hrs), Avg:99.4 F (37.4 C), Min:98.3 F (36.8 C), Max:103.2 F (39.6 C)  Recent Labs  Lab 03/05/20 1703 03/06/20 0432 03/06/20 1713 03/07/20 0458 03/07/20 1547 03/08/20 0432 03/09/20 0521 03/10/20 0351 03/10/20 0352  WBC  --  12.5*  --  11.0*  --  13.6* 11.2*  --  17.9*  CREATININE   < >  --  2.12* 2.02* 1.92* 1.84*  --   --  4.96*  LATICACIDVEN  --   --   --   --   --   --   --  1.7  --    < > = values in this interval not displayed.    Estimated Creatinine Clearance: 15.6 mL/min (A) (by C-G formula based on SCr of 4.96 mg/dL (H)).    Allergies  Allergen Reactions  . Hydrocodone Itching and Nausea And Vomiting   Plan: Adjust zosyn to 3.375 gm iv q12h Vanc 1 g x 1 today after HD F/U susceptibilities to adjust abx  Barth Kirks, PharmD, BCPS, BCCCP Clinical Pharmacist [phone]  Please check AMION for all [place] numbers  03/10/2020 11:18 AM

## 2020-03-10 NOTE — Progress Notes (Signed)
eLink Physician-Brief Progress Note Patient Name: Wanda Ramirez DOB: Oct 18, 1970 MRN: [id number]   Date of Service  03/10/2020  HPI/Events of Note  Hemoglobin 6.4 gm  eICU Interventions  Transfuse 2 units PRBC        Betsaida [place] U Ladine Kiper 03/10/2020, 6:56 AM

## 2020-03-10 NOTE — Progress Notes (Signed)
[facility] Progress Note  Subjective:  Temp 103, abx restarted  Vitals:   03/10/20 1200 03/10/20 1300 03/10/20 1400 03/10/20 1413  BP:    (!) 129/45  Pulse:   (!) 109 (!) 113  Resp: (!) 36 (!) 36 (!) 36 (!) 36  Temp:      TempSrc:      SpO2:   99% 98%  Weight:      Height:        Exam:    alert, nad    no jvd  Chest bilat post crackles throughout  Cor reg no RG  Abd soft ntnd no ascites   Ext no LE or UE edema   NF, nonofocal, tired     L AVF   OP HD: TTS  4h 24mn 450/800  2/2.25 bath  111.5kg  Hep 2000  L AVF  - darbe 50 ug q week, last 9/7  - calc 1.0 tiw  - 9/9 Hb 10.0, tsat 22%  Assessment/ Plan: # COVID pna w/ acute on chronic hypoxic respiratory failure:  dexamethasone, status post tocilizumab and remdesivir.  Severe CXR changes.    # Acute hypoxic resp failure  - extubated on 10/6 > reintubated on 10/8 - per primary team  #  Staph aureus VAP/ HCAP 03/09/20 - restarted on IV abx, responding  # ESRD: usual HD is TTS.   - sp CRRT 9/28- 10/7 - plan HD today on schedule - labs okay  # Clotted LUA AVF: note TDC placed by IR 9/11. Will need new perm access at some point when pt is more stable.  Tunneled catheter exchanged on 9/27 with IR.   # Septic shock - 2/2 covid  - back on levo gtt  # Volume status: no vol excess on exam, wt's down 7 kg from dry wt, low CVP's - got LR 1 L bolus yest  # Anemia ckd / critical illness: aranesp increased to 150 mcg weekly for now, last 10/5  # MBD ckd:  while on CRRT [place] the [place]. Pt back on vent.   #DM2 - management per primary team  RKelly Splinter MD 03/10/2020, 3:54 PM           Recent Labs  Lab 03/08/20 0432 03/09/20 0521 03/09/20 2053 03/09/20 2053 03/10/20 0352 03/10/20 1509  K 4.5   < > 5.3*  --  5.1  --   BUN 25*  --   --   --  65*  --   CREATININE 1.84*  --   --   --  4.96*  --   CALCIUM 8.9  --   --   --  9.1  --   PHOS 2.6  --   --   --  5.6*  --    HGB 7.6*   < > 7.1*   < > 6.4* 8.2*   < > = values in this interval not displayed.   Inpatient medications:  sodium chloride   Intravenous Once   apixaban  2.5 mg Per Tube BID   B-complex with vitamin C  1 tablet Per Tube Daily   chlorhexidine gluconate (MEDLINE KIT)  15 mL Mouth Rinse BID   Chlorhexidine Gluconate Cloth  6 each Topical Q0600   darbepoetin (ARANESP) injection - DIALYSIS  150 mcg Intravenous Q Tue-HD   docusate  100 mg Per Tube BID   feeding supplement (PROSource TF)  45 mL Per Tube QID   insulin aspart  0-15 Units Subcutaneous Q4H  levothyroxine  125 mcg Per Tube Q0600   mouth rinse  15 mL Mouth Rinse BID   pantoprazole sodium  40 mg Per Tube BID   polyethylene glycol  17 g Per Tube BID   sodium chloride flush  10-40 mL Intracatheter Q12H   sodium chloride flush  3 mL Intravenous Q12H   vancomycin variable dose per unstable renal function (pharmacist dosing)   Does not apply See admin instructions    sodium chloride 10 mL/hr at 03/10/20 1400   sodium chloride     dexmedetomidine (PRECEDEX) IV infusion Stopped (03/09/20 1712)   feeding supplement (VITAL 1.5 CAL) 1,000 mL (03/10/20 1400)   fentaNYL infusion INTRAVENOUS 200 mcg/hr (03/10/20 1400)   midazolam 8 mg/hr (03/10/20 1535)   norepinephrine (LEVOPHED) Adult infusion Stopped (03/10/20 1326)   sodium chloride, sodium chloride, acetaminophen (TYLENOL) oral liquid 160 mg/5 mL, albuterol, fentaNYL, fentaNYL (SUBLIMAZE) injection, [START ON 03/11/2020] heparin, influenza vac split quadrivalent PF, lip balm, midazolam, sodium chloride flush, sodium chloride flush

## 2020-03-10 NOTE — Progress Notes (Signed)
NAME:  [name], MRN:  [id number], DOB:  Jul 06, 1970, LOS: 91 ADMISSION DATE:  02/13/2020, CONSULTATION DATE: 02/24/2020 REFERRING MD: Dr. Heber Cherokee, CHIEF COMPLAINT: Increasing supplemental oxygen demand  Brief History   Wanda Ramirez is a 49 year old female with a past medical history significant for end-stage renal disease on hemodialysis MWF, chronic hypoxic respiratory failure on 3 L nasal cannula at baseline, sleep apnea, diabetes, hypertension, hyperlipidemia, hypothyroidism, and asthma who presented to the emergency department with complaints of worsening shortness of breath, dry cough, generalized body aches, and loose stools.  Patient was diagnosed 9/9 with Covid pneumonia.  Initially she was able to maintain outpatient status but when symptoms worsened 9/14 she presented to the emergency department.  Patient reported dyspnea and weakness worsened to the point where she was unable to ambulate effectively.  She also reports she was unable to maintain adequate oral intake.  Patient received the moderna vaccines in March and April of this year.  Patient presented initially febrile with a temperature of 102.9 and mildly tachypneic with all other vital signs within normal limits.  She was initially managed on 4 L nasal cannula.  Lab work significant for NA 130, K3.8, glucose 454, anion gap 21, lactic acid 1.8, LDH 296, ferritin 6690, CRP 13.3, and procalcitonin 10.51  On review of medical record it appears patient has progressively required increased supplemental oxygen requirement.  Afternoon of 9/24 patient had episode of desaturation requiring further increase in supplemental oxygen.  On chart assessment it appears patient has been placed on 40 L with reported increased work of breathing prompting PCCM consultation  Past Medical History  Sleep apnea Morbid obesity Hypothyroidism Hypertension GERD  End-stage renal disease on HD MWF Diabetes Asthma   Immunization History   Administered Date(s) Administered  . Influenza,inj,Quad PF,6+ Mos 02/09/2018     Significant Hospital Events   Admitted 9/14  9/24 - Lying in bed on side currently undergoing iHD,  She is lethargic but will arouse to verbal stimuli but quickly falls back to sleep.  RN states patient has had progressive decline over the last 12hrs 9/25 - -currently in medical ICU to [facility].  Bed 12.  She has been on BiPAP with Precedex overnight.  This morning on the low-dose of Precedex she got agitated.?  Also had melena.  She also desaturated.  Nursing then increase her Precedex and since then she has been more stable.  Yesterday dialysis was cut short.  9/26 -unable to run CRRT with prone ventilation.  Resume supine ventilation but still unable to draw blood back. 9/27 transferred to 41m. IR replaced HD catheter 10/1 on CRRT and vasopressors 10/6 - extubated 10/7 - ad some stridor and increased WOB so placed on BIPAP and dexamethasone given.  Consults:  PCCM  Procedures:  9/23 IR guided tunneled HD cath placement - left subclavian ?  Date right radial art line ?  Date right IJ 10/6 extubated 10/ 8 -reintubated.  Febrile to 103.  Started on cultures and antibiotics.  Significant Diagnostic Tests:  Pulmonary VQ scan at 9/21 > negative  Micro Data:   03/09/2020 -tracheal aspirate -abundant Staphylococcus 03/09/2020-blood culture    Antimicrobials:  Cefepime 9/24 > Azithromycin 9/21 > 9/26 Remdesivir completed 9/17 > 9/19 Tocilizumab x1  xxx Vancomycin 03/09/2020- Zosyn 03/09/2020-  Interim history/subjective:   10/9 - on vent 40%. Fent gtt, Precedex gtt, On levophed gtt - coming to nearly off. Fever coming down. RN reporting discolored mucus from lung. Tolerated HD despite perssors  Objective  Blood pressure (!) 137/56, pulse 96, temperature 100 F (37.8 C), temperature source Axillary, resp. rate (!) 37, height 5\' 2"  (1.575 m), weight 105.2 kg, SpO2 100 %.    Vent Mode:  PRVC FiO2 (%):  [40 %-80 %] 40 % Set Rate:  [30 bmp-35 bmp] 35 bmp Vt Set:  [300 mL-400 mL] 400 mL PEEP:  [10 cmH20] 10 cmH20 Plateau Pressure:  [22 cmH20-32 cmH20] 32 cmH20   Intake/Output Summary (Last 24 hours) at 03/10/2020 1217 Last data filed at 03/10/2020 1038 Gross per 24 hour  Intake 4190.05 ml  Output 2200 ml  Net 1990.05 ml   Filed Weights   03/07/20 1700 03/10/20 0700 03/10/20 1038  Weight: 104.9 kg 107 kg 105.2 kg   General Appearance:  Looks criticall ill OBESE - + Head:  Normocephalic, without obvious abnormality, atraumatic Eyes:  PERRL - yes, conjunctiva/corneas - muddy     Ears:  Normal external ear canals, both ears Nose:  G tube - yes Throat:  ETT TUBE - YES , OG tube - no Neck:  Supple,  No enlargement/tenderness/nodules Lungs: Clear to auscultation bilaterally, Ventilator   Synchrony - yes, 40% fio2, peep 10 Heart:  S1 and S2 normal, no murmur, CVP - x.  Pressors - levophed Abdomen:  Soft, no masses, no organomegaly Genitalia / Rectal:  Not done Extremities:  Extremities- intact Skin:  ntact in exposed areas . Sacral area - x Neurologic:  Sedation - gtt -> RASS - -3 . Moves all 4s - yes. CAM-ICU - x . Orientation - opens eyes to command    Resolved Hospital Problem list   Mixed metabolic/respiratory acidosis  Acute on chronic Anemia likely component of chronic disease with blood loss no obvious signs of bleeding s/p 2 u prbc on 9/28 - Stable, monitor  Shock, undifferentiated, improved Patient's echocardiogram shows RV overload.  Could be RV stunning from PVR increase due to COVID +/- septic effect of COVID, regardless, improved. She is off antibiotics - Resolved  Assessment & Plan:   Acute hypoxic/hypercapnic respiratory failure due to ARDS secondary to COVID 19 Pneumonia Patient has completed steroid, remdesivir, immunomodulating therapy. Extubated 10/6 . Reintubated 03/09/20 - likely due to VAP/HCAP  03/10/2020 - > does NOT meet criteria for  SBT/Extubation in setting of Acute Respiratory Failure due to  High peep setttings, sedation gtt, and pressor needed   Plan -PRVC support -Ventilator associated pneumonia bundle - need trach/ -> LTAC  Circulatory shock  03/10/2020 - coming down on levoped  Plan  - levophed for MAP > 65   Staph aureus VAP/HCAP 03/09/20  03/10/2020 - responding to abx  Plan  - await sesnsitivieis  -dc zosyn   ESRD on HD  - s/p CRRT ending 03/08/20  10/9 - first HD  plan   iHD from 03/10/20  Thrombocytopenia, at risk VTE- HIT Ab neg, low 4T score, resumed heparin drip 10/4 I would keep her on more aggressive anticoagulation in setting of RV dysfunction, high D dimer, and recent COVID.  No definitive evidence of VTE argues against full AC.  Plan - - eliquis 2.5mg  BID ( as laid out 03/08/20 by DR Tamala Julian) x 1 month sinc discharge  Anemia of critical illness   03/10/2020 - hgb 6,4 and given 2 U PRBC and perssor need Plan  - - PRBC for hgb </= 6.9gm%    - exceptions are   -  if ACS susepcted/confirmed then transfuse for hgb </= 8.0gm%,  or    -  active bleeding with hemodynamic instability, then transfuse regardless of hemoglobin value   At at all times try to transfuse 1 unit prbc as possible with exception of active hemorrhage    Poorly controlled diabetes with hyperglycemia, A1c 12.8% on admission -Better controlled with SSI and long-acting   Best practice:  Diet: TF, swallow screen if weans from BIPAP Pain/Anxiety/Delirium protocol (if indicated): off VAP protocol (if indicated): off DVT prophylaxis: see above GI prophylaxis: PPI Glucose control: see above Mobility:  Bed rest  Code Status: Full  Family Communication:  Daughter Kawehi Hostetter (the one RN says MD shoulod commuicate with) -> updated 03/10/20   Disposition:  ICU. Marland KitchenCan go to non-covid ICU. Needs LTAC    ATTESTATION & SIGNATURE   The patient Wanda Ramirez is critically ill with multiple organ systems failure  and requires high complexity decision making for assessment and support, frequent evaluation and titration of therapies, application of advanced monitoring technologies and extensive interpretation of multiple databases.   Critical Care Time devoted to patient care services described in this note is  45  Minutes. This time reflects time of care of this signee Dr Brand Males. This critical care time does not reflect procedure time, or teaching time or supervisory time of PA/NP/Med student/Med Resident etc but could involve care discussion time     Dr. Brand Males, M.D., [facility].C.P Pulmonary and Critical Care Medicine Staff Physician [facility] Pager: [phone], If no answer or between  15:00h - 7:00h: call [phone]  03/10/2020 12:40 PM      LABS    PULMONARY Recent Labs  Lab 03/04/20 0927 03/07/20 1816 03/09/20 1510 03/09/20 2053  PHART 7.408 7.366 7.193* 7.346*  PCO2ART 37.5 41.7 74.4* 49.1*  PO2ART 76* 71* 142* 116*  HCO3 23.7 24.0 27.7 26.6  TCO2 25 25 30 28   O2SAT 95.0 94.0 98.0 98.0    CBC Recent Labs  Lab 03/08/20 0432 03/08/20 0432 03/09/20 0521 03/09/20 0521 03/09/20 1510 03/09/20 2053 03/10/20 0352  HGB 7.6*   < > 7.5*   < > 7.5* 7.1* 6.4*  HCT 24.2*   < > 24.3*   < > 22.0* 21.0* 20.5*  WBC 13.6*  --  11.2*  --   --   --  17.9*  PLT 115*  --  142*  --   --   --  190   < > = values in this interval not displayed.    COAGULATION No results for input(s): INR in the last 168 hours.  CARDIAC  No results for input(s): TROPONINI in the last 168 hours. No results for input(s): PROBNP in the last 168 hours.   CHEMISTRY Recent Labs  Lab 03/05/20 1703 03/06/20 0432 03/06/20 1713 03/06/20 1713 03/07/20 0458 03/07/20 0458 03/07/20 1547 03/07/20 1547 03/07/20 1816 03/07/20 1816 03/08/20 0432 03/08/20 0432 03/09/20 0521 03/09/20 1510 03/09/20 1510 03/09/20 2053 03/10/20 0352  NA   <  >  --  136   < > 137   < > 137   < > 135  --  137  --   --  138  --  138 141  K   < >  --  4.4   < > 4.0   < > 4.8   < > 4.9   < > 4.5   < >  --  5.2*   < > 5.3* 5.1  CL   < >  --  102  --  104  --  102  --   --   --  103  --   --   --   --   --  105  CO2   < >  --  23  --  23  --  23  --   --   --  22  --   --   --   --   --  23  GLUCOSE   < >  --  166*  --  117*  --  179*  --   --   --  180*  --   --   --   --   --  263*  BUN   < >  --  23*  --  23*  --  23*  --   --   --  25*  --   --   --   --   --  65*  CREATININE   < >  --  2.12*  --  2.02*  --  1.92*  --   --   --  1.84*  --   --   --   --   --  4.96*  CALCIUM   < >  --  8.7*  --  8.7*  --  8.7*  --   --   --  8.9  --   --   --   --   --  9.1  MG  --  2.7*  --   --  2.7*  --   --   --   --   --  2.6*  --  2.7*  --   --   --  2.9*  PHOS   < > 3.4 3.7  --  3.4  --  4.5  --   --   --  2.6  --   --   --   --   --  5.6*   < > = values in this interval not displayed.   Estimated Creatinine Clearance: 15.6 mL/min (A) (by C-G formula based on SCr of 4.96 mg/dL (H)).   LIVER Recent Labs  Lab 03/06/20 1713 03/07/20 0458 03/07/20 1547 03/08/20 0432 03/10/20 0352  ALBUMIN 1.9* 1.8* 1.8* 1.8* 1.4*     INFECTIOUS Recent Labs  Lab 03/09/20 1534 03/10/20 0351 03/10/20 0352  LATICACIDVEN  --  1.7  --   PROCALCITON 4.36  --  8.39     ENDOCRINE CBG (last 3)  Recent Labs    03/10/20 0336 03/10/20 0744 03/10/20 1141  GLUCAP 251* 242* 178*         IMAGING x48h  - image(s) personally visualized  -   highlighted in bold DG CHEST PORT 1 VIEW  Result Date: 03/09/2020 CLINICAL DATA:  COVID-19 positivity with ARDS EXAM: PORTABLE CHEST 1 VIEW COMPARISON:  02/27/2020 FINDINGS: Cardiac shadow is stable. Dialysis catheter, endotracheal tube, feeding catheter and right jugular central line are seen. The endotracheal tube lies at the level of the carina and should be withdrawn 2-3 cm. Patchy airspace opacity is again identified but now  in a symmetrical fashion bilaterally consistent with the given clinical history of COVID-19 positivity. No bony abnormality is seen. IMPRESSION: Bilateral airspace opacities consistent with the given clinical history. Endotracheal tube now lies at the level of the carina and should be withdrawn 2-3 cm. These results will be called to the ordering clinician or representative by the Radiologist Assistant, and communication  documented in the PACS or [employer]. Electronically Signed   By: Inez Catalina M.D.   On: 03/09/2020 16:45

## 2020-03-10 NOTE — Progress Notes (Signed)
Assisted tele visit to patient with family member.  Marthe Dant Samson, RN  

## 2020-03-11 ENCOUNTER — Inpatient Hospital Stay (HOSPITAL_COMMUNITY): Payer: Medicaid Other

## 2020-03-11 DIAGNOSIS — J15211 Pneumonia due to Methicillin susceptible Staphylococcus aureus: Secondary | ICD-10-CM | POA: Diagnosis not present

## 2020-03-11 DIAGNOSIS — J9601 Acute respiratory failure with hypoxia: Secondary | ICD-10-CM | POA: Diagnosis not present

## 2020-03-11 DIAGNOSIS — U071 COVID-19: Secondary | ICD-10-CM | POA: Diagnosis not present

## 2020-03-11 DIAGNOSIS — J80 Acute respiratory distress syndrome: Secondary | ICD-10-CM | POA: Diagnosis not present

## 2020-03-11 LAB — POCT I-STAT 7, (LYTES, BLD GAS, ICA,H+H)
Acid-Base Excess: 3 mmol/L — ABNORMAL HIGH (ref 0.0–2.0)
Bicarbonate: 27.2 mmol/L (ref 20.0–28.0)
Calcium, Ion: 1.25 mmol/L (ref 1.15–1.40)
HCT: 25 % — ABNORMAL LOW (ref 36.0–46.0)
Hemoglobin: 8.5 g/dL — ABNORMAL LOW (ref 12.0–15.0)
O2 Saturation: 98 %
Patient temperature: 99
Potassium: 3.8 mmol/L (ref 3.5–5.1)
Sodium: 138 mmol/L (ref 135–145)
TCO2: 28 mmol/L (ref 22–32)
pCO2 arterial: 42 mmHg (ref 32.0–48.0)
pH, Arterial: 7.421 (ref 7.350–7.450)
pO2, Arterial: 108 mmHg (ref 83.0–108.0)

## 2020-03-11 LAB — TYPE AND SCREEN
ABO/RH(D): O POS
Antibody Screen: NEGATIVE
Unit division: 0
Unit division: 0

## 2020-03-11 LAB — GLUCOSE, CAPILLARY
Glucose-Capillary: 164 mg/dL — ABNORMAL HIGH (ref 70–99)
Glucose-Capillary: 172 mg/dL — ABNORMAL HIGH (ref 70–99)
Glucose-Capillary: 173 mg/dL — ABNORMAL HIGH (ref 70–99)
Glucose-Capillary: 176 mg/dL — ABNORMAL HIGH (ref 70–99)
Glucose-Capillary: 178 mg/dL — ABNORMAL HIGH (ref 70–99)
Glucose-Capillary: 178 mg/dL — ABNORMAL HIGH (ref 70–99)
Glucose-Capillary: 178 mg/dL — ABNORMAL HIGH (ref 70–99)
Glucose-Capillary: 182 mg/dL — ABNORMAL HIGH (ref 70–99)
Glucose-Capillary: 193 mg/dL — ABNORMAL HIGH (ref 70–99)
Glucose-Capillary: 193 mg/dL — ABNORMAL HIGH (ref 70–99)
Glucose-Capillary: 195 mg/dL — ABNORMAL HIGH (ref 70–99)
Glucose-Capillary: 197 mg/dL — ABNORMAL HIGH (ref 70–99)
Glucose-Capillary: 200 mg/dL — ABNORMAL HIGH (ref 70–99)
Glucose-Capillary: 270 mg/dL — ABNORMAL HIGH (ref 70–99)

## 2020-03-11 LAB — RENAL FUNCTION PANEL
Albumin: 1.2 g/dL — ABNORMAL LOW (ref 3.5–5.0)
Anion gap: 12 (ref 5–15)
BUN: 49 mg/dL — ABNORMAL HIGH (ref 6–20)
CO2: 25 mmol/L (ref 22–32)
Calcium: 9 mg/dL (ref 8.9–10.3)
Chloride: 100 mmol/L (ref 98–111)
Creatinine, Ser: 3.99 mg/dL — ABNORMAL HIGH (ref 0.44–1.00)
GFR, Estimated: 12 mL/min — ABNORMAL LOW (ref >60)
Glucose, Bld: 194 mg/dL — ABNORMAL HIGH (ref 70–99)
Phosphorus: 4.8 mg/dL — ABNORMAL HIGH (ref 2.5–4.6)
Potassium: 4.2 mmol/L (ref 3.5–5.1)
Sodium: 137 mmol/L (ref 135–145)

## 2020-03-11 LAB — PROCALCITONIN: Procalcitonin: 10.82 ng/mL

## 2020-03-11 LAB — BPAM RBC
Blood Product Expiration Date: 202110232359
Blood Product Expiration Date: 202110232359
ISSUE DATE / TIME: 202110090915
ISSUE DATE / TIME: 202110090915
Unit Type and Rh: 5100
Unit Type and Rh: 5100

## 2020-03-11 LAB — CULTURE, RESPIRATORY W GRAM STAIN

## 2020-03-11 MED ORDER — INSULIN ASPART 100 UNIT/ML ~~LOC~~ SOLN
0.0000 [IU] | SUBCUTANEOUS | Status: DC
  Administered 2020-03-11: 8 [IU] via SUBCUTANEOUS
  Administered 2020-03-11: 3 [IU] via SUBCUTANEOUS
  Administered 2020-03-12: 11 [IU] via SUBCUTANEOUS
  Administered 2020-03-12: 5 [IU] via SUBCUTANEOUS
  Administered 2020-03-12 (×2): 11 [IU] via SUBCUTANEOUS
  Administered 2020-03-12: 5 [IU] via SUBCUTANEOUS
  Administered 2020-03-12: 8 [IU] via SUBCUTANEOUS
  Administered 2020-03-12: 3 [IU] via SUBCUTANEOUS
  Administered 2020-03-13 (×2): 8 [IU] via SUBCUTANEOUS

## 2020-03-11 MED ORDER — CHLORHEXIDINE GLUCONATE 0.12% ORAL RINSE (MEDLINE KIT)
15.0000 mL | Freq: Two times a day (BID) | OROMUCOSAL | Status: DC
  Administered 2020-03-11 – 2020-05-15 (×122): 15 mL via OROMUCOSAL

## 2020-03-11 MED ORDER — DEXTROSE 10 % IV SOLN: INTRAVENOUS | Status: DC | PRN

## 2020-03-11 MED ORDER — FENTANYL CITRATE (PF) 2500 MCG/50ML IJ SOLN
100.0000 ug/h | Status: DC
  Administered 2020-03-11 – 2020-03-12 (×3): 400 ug/h via INTRAVENOUS
  Administered 2020-03-12: 300 ug/h via INTRAVENOUS
  Administered 2020-03-13 – 2020-03-14 (×2): 350 ug/h via INTRAVENOUS
  Administered 2020-03-14: 250 ug/h via INTRAVENOUS
  Administered 2020-03-15: 200 ug/h via INTRAVENOUS
  Administered 2020-03-16: 75 ug/h via INTRAVENOUS
  Administered 2020-03-18: 200 ug/h via INTRAVENOUS
  Administered 2020-03-19 (×2): 300 ug/h via INTRAVENOUS
  Administered 2020-03-20: 325 ug/h via INTRAVENOUS
  Filled 2020-03-11: qty 50
  Filled 2020-03-11 (×5): qty 100
  Filled 2020-03-11: qty 50
  Filled 2020-03-11 (×9): qty 100
  Filled 2020-03-11 (×2): qty 50
  Filled 2020-03-11 (×2): qty 100

## 2020-03-11 MED ORDER — SODIUM CHLORIDE 0.9 % IV SOLN
2.0000 g | INTRAVENOUS | Status: AC
  Administered 2020-03-11 – 2020-03-17 (×7): 2 g via INTRAVENOUS
  Filled 2020-03-11 (×5): qty 20

## 2020-03-11 MED ORDER — INSULIN DETEMIR 100 UNIT/ML ~~LOC~~ SOLN
24.0000 [IU] | Freq: Two times a day (BID) | SUBCUTANEOUS | Status: DC
  Administered 2020-03-11 (×2): 24 [IU] via SUBCUTANEOUS
  Filled 2020-03-11 (×4): qty 0.24

## 2020-03-11 MED ORDER — INSULIN ASPART 100 UNIT/ML ~~LOC~~ SOLN: 3.0000 [IU] | SUBCUTANEOUS | Status: DC

## 2020-03-11 MED ORDER — INSULIN ASPART 100 UNIT/ML ~~LOC~~ SOLN
8.0000 [IU] | SUBCUTANEOUS | Status: DC
  Administered 2020-03-11 – 2020-03-12 (×6): 8 [IU] via SUBCUTANEOUS

## 2020-03-11 MED ORDER — ORAL CARE MOUTH RINSE
15.0000 mL | OROMUCOSAL | Status: DC
  Administered 2020-03-11 – 2020-04-17 (×345): 15 mL via OROMUCOSAL

## 2020-03-11 NOTE — Progress Notes (Signed)
Assisted tele visit to patient with family member.  Adithi Gammon eLink RN

## 2020-03-11 NOTE — Progress Notes (Signed)
RT NOTES: Withdrew ETT 2cm from 23 at the lip to 21 at the lip per MD.

## 2020-03-11 NOTE — Progress Notes (Signed)
NAME:  Wanda JAGIELLO, MRN:  [id number], DOB:  [birth year], LOS: 48 ADMISSION DATE:  02/13/2020, CONSULTATION DATE: 02/24/2020 REFERRING MD: Dr. Heber Quinhagak, CHIEF COMPLAINT: Increasing supplemental oxygen demand  Brief History   Wanda Ramirez is a 49 year old female with a past medical history significant for end-stage renal disease on hemodialysis MWF, chronic hypoxic respiratory failure on 3 L nasal cannula at baseline, sleep apnea, diabetes, hypertension, hyperlipidemia, hypothyroidism, and asthma who presented to the emergency department with complaints of worsening shortness of breath, dry cough, generalized body aches, and loose stools.  Patient was diagnosed 9/9 with Covid pneumonia.  Initially she was able to maintain outpatient status but when symptoms worsened 9/14 she presented to the emergency department.  Patient reported dyspnea and weakness worsened to the point where she was unable to ambulate effectively.  She also reports she was unable to maintain adequate oral intake.  Patient received the moderna vaccines in March and April of this year.  Patient presented initially febrile with a temperature of 102.9 and mildly tachypneic with all other vital signs within normal limits.  She was initially managed on 4 L nasal cannula.  Lab work significant for NA 130, K3.8, glucose 454, anion gap 21, lactic acid 1.8, LDH 296, ferritin 6690, CRP 13.3, and procalcitonin 10.51  On review of medical record it appears patient has progressively required increased supplemental oxygen requirement.  Afternoon of 9/24 patient had episode of desaturation requiring further increase in supplemental oxygen.  On chart assessment it appears patient has been placed on 40 L with reported increased work of breathing prompting PCCM consultation  Past Medical History  Sleep apnea Morbid obesity Hypothyroidism Hypertension GERD  End-stage renal disease on HD MWF Diabetes Asthma   Immunization History   Administered Date(s) Administered  . Influenza,inj,Quad PF,6+ Mos 02/09/2018     Significant Hospital Events   Admitted 9/14  9/24 - Lying in bed on side currently undergoing iHD,  She is lethargic but will arouse to verbal stimuli but quickly falls back to sleep.  RN states patient has had progressive decline over the last 12hrs 9/25 - -currently in medical ICU to [facility].  Bed 12.  She has been on BiPAP with Precedex overnight.  This morning on the low-dose of Precedex she got agitated.?  Also had melena.  She also desaturated.  Nursing then increase her Precedex and since then she has been more stable.  Yesterday dialysis was cut short.  9/26 -unable to run CRRT with prone ventilation.  Resume supine ventilation but still unable to draw blood back. 9/27 transferred to 87m. IR replaced HD catheter 10/1 on CRRT and vasopressors 10/6 - extubated 10/7 - ad some stridor and increased WOB so placed on BIPAP and dexamethasone given.  Consults:  PCCM  Procedures:  9/23 IR guided tunneled HD cath placement - left subclavian ?  Date right radial art line ?  Date right IJ 10/6 extubated 10/ 8 -reintubated.  Febrile to 103.  Started on cultures and antibiotics.  10/9 -  on vent 40%. Fent gtt, Precedex gtt, On levophed gtt - coming to nearly off. Fever coming down. RN reporting discolored mucus from lung. Tolerated HD despite perssors  Significant Diagnostic Tests:  Pulmonary VQ scan at 9/21 > negative  Micro Data:   03/09/2020 -tracheal aspirate -abundant Staphylococcus -MSSA 03/09/2020-blood culture 0 negative as of 03/12/2020    Antimicrobials:  Cefepime 9/24 > Azithromycin 9/21 > 9/26 Remdesivir completed 9/17 > 9/19 Tocilizumab x1  xxx Vancomycin 03/09/2020-03/11/2020 Zosyn  03/09/2020-03/11/2020 Ceftriaxone 03/11/2020 >>  Interim history/subjective:   10/10 --remains on the ventilator with FiO2 40%/peep 8.  On fentanyl infusion, Versed infusion and Levophed infusion.  Also  on insulin infusion.  Antibiotic changed to ceftriaxone due to MSSA.  Fever continues but all however fever curve might be coming down n  Objective   Blood pressure (!) 119/54, pulse (!) 183, temperature (!) 100.9 F (38.3 C), temperature source Axillary, resp. rate (!) 21, height 5\' 2"  (1.575 m), weight 105.2 kg, SpO2 99 %.    Vent Mode: PRVC FiO2 (%):  [40 %] 40 % Set Rate:  [35 bmp] 35 bmp Vt Set:  [400 mL] 400 mL PEEP:  [8 cmH20-10 cmH20] 8 cmH20 Plateau Pressure:  [24 cmH20-30 cmH20] 30 cmH20   Intake/Output Summary (Last 24 hours) at 03/11/2020 1551 Last data filed at 03/11/2020 1500 Gross per 24 hour  Intake 2617.13 ml  Output 0 ml  Net 2617.13 ml   Filed Weights   03/07/20 1700 03/10/20 0700 03/10/20 1038  Weight: 104.9 kg 107 kg 105.2 kg   General Appearance:  Looks criticall ill OBESE - + Head:  Normocephalic, without obvious abnormality, atraumatic Eyes:  PERRL - yes, conjunctiva/corneas - muddy     Ears:  Normal external ear canals, both ears Nose:  G tube - yes Throat:  ETT TUBE - YES , OG tube - no Neck:  Supple,  No enlargement/tenderness/nodules Lungs: Clear to auscultation bilaterally, Ventilator   Synchrony - yes, 40% fio2, peep 8 Heart:  S1 and S2 normal, no murmur, CVP - x.  Pressors - levophed Abdomen:  Soft, no masses, no organomegaly Genitalia / Rectal:  Not done Extremities:  Extremities- intact Skin:  ntact in exposed areas . Sacral area - not examined Neurologic:  Sedation - gtt -> RASS - -4 . Moves all 4s - yes. CAM-ICU - NA . Orientation - not oriented   Resolved Hospital Problem list   Mixed metabolic/respiratory acidosis  Acute on chronic Anemia likely component of chronic disease with blood loss no obvious signs of bleeding s/p 2 u prbc on 9/28 - Stable, monitor  Shock, undifferentiated, improved Patient's echocardiogram shows RV overload.  Could be RV stunning from PVR increase due to COVID +/- septic effect of COVID, regardless,  improved. She is off antibiotics - Resolved  Assessment & Plan:   Acute hypoxic/hypercapnic respiratory failure due to ARDS secondary to COVID 19 Pneumonia Patient has completed steroid, remdesivir, immunomodulating therapy. Extubated 10/6 . Reintubated 03/09/20 - due to MSSA VAP   03/11/2020 - > does not meet criteria for SBT/Extubation in setting of Acute Respiratory Failure due to VAP, levophed need and sedation   Plan -PRVC support -Ventilator associated pneumonia bundle - need trach -> LTAC (d/w Dr Tamala Julian who Is aare)  Circulatory shock  03/11/2020 - coming down on levoped - currntly 38mcg  Plan  - levophed for MAP > 65   MSSA VAP/HCAP  03/09/20  03/11/2020 - febrile again. SEnsitivitis with MSSA   Plan - change to ceftriaxone 03/12/20 (need to set stop date)   ESRD on HD  - s/p CRRT ending 03/08/20  10/9 - first HD  plan   iHD per renal  Thrombocytopenia, at risk VTE- HIT Ab neg, low 4T score, resumed heparin drip 10/4 I would keep her on more aggressive anticoagulation in setting of RV dysfunction, high D dimer, and recent COVID.  No definitive evidence of VTE argues against full AC.  Plan - - eliquis 2.5mg  BID (  as laid out 03/08/20 by DR Tamala Julian) x 1 month sinc discharge  Anemia of critical illness - s/o 2 U PRBC 03/10/20 for hgb 6.4   03/11/2020 -no active bleeding. Hgb > 7  Plan  - - PRBC for hgb </= 6.9gm%    - exceptions are   -  if ACS susepcted/confirmed then transfuse for hgb </= 8.0gm%,  or    -  active bleeding with hemodynamic instability, then transfuse regardless of hemoglobin value   At at all times try to transfuse 1 unit prbc as possible with exception of active hemorrhage    Poorly controlled diabetes with hyperglycemia, A1c 12.8% on admission -Better controlled with SSI and long-acting   Best practice:  Diet: TF, swallow screen if weans from BIPAP Pain/Anxiety/Delirium protocol (if indicated): off VAP protocol (if indicated):  off DVT prophylaxis: see above GI prophylaxis: PPI Glucose control: see above Mobility:  Bed rest  Code Status: Full  Family Communication:  Daughter Lariya Kinzie (the one RN says MD shoulod commuicate with) -> updated 03/10/20 and 03/11/20. Expl;ained concept of secondary pneumonia. Explained need for trach/ltac   Disposition:  ICU. Marland KitchenCan go to non-covid ICU. Needs Trach/LTAC     ATTESTATION & SIGNATURE   The patient Wanda Ramirez is critically ill with multiple organ systems failure and requires high complexity decision making for assessment and support, frequent evaluation and titration of therapies, application of advanced monitoring technologies and extensive interpretation of multiple databases.   Critical Care Time devoted to patient care services described in this note is  60  Minutes. This time reflects time of care of this signee Dr Brand Males. This critical care time does not reflect procedure time, or teaching time or supervisory time of PA/NP/Med student/Med Resident etc but could involve care discussion time     Dr. Brand Males, M.D., [facility].C.P Pulmonary and Critical Care Medicine Staff Physician [place] [facility] Pager: [phone], If no answer or between  15:00h - 7:00h: call [phone]  03/11/2020 4:04 PM      LABS    PULMONARY Recent Labs  Lab 03/07/20 1816 03/09/20 1510 03/09/20 2053 03/11/20 0211  PHART 7.366 7.193* 7.346* 7.421  PCO2ART 41.7 74.4* 49.1* 42.0  PO2ART 71* 142* 116* 108  HCO3 24.0 27.7 26.6 27.2  TCO2 25 30 28 28   O2SAT 94.0 98.0 98.0 98.0    CBC Recent Labs  Lab 03/08/20 0432 03/08/20 0432 03/09/20 0521 03/09/20 1510 03/10/20 0352 03/10/20 1509 03/11/20 0211  HGB 7.6*   < > 7.5*   < > 6.4* 8.2* 8.5*  HCT 24.2*   < > 24.3*   < > 20.5* 25.4* 25.0*  WBC 13.6*  --  11.2*  --  17.9*  --   --   PLT 115*  --  142*  --  190  --   --    < > = values in this interval  not displayed.    COAGULATION No results for input(s): INR in the last 168 hours.  CARDIAC  No results for input(s): TROPONINI in the last 168 hours. No results for input(s): PROBNP in the last 168 hours.   CHEMISTRY Recent Labs  Lab 03/06/20 3474 03/06/20 1713 03/07/20 0458 03/07/20 0458 03/07/20 1547 03/07/20 1816 03/08/20 2595 03/08/20 6387 03/09/20 0521 03/09/20 1510 03/09/20 1510 03/09/20 2053 03/09/20 2053 03/10/20 0352 03/10/20 0352 03/11/20 0211 03/11/20 0407  NA  --    < > 137   < >  137   < > 137   < >  --  138  --  138  --  141  --  138 137  K  --    < > 4.0   < > 4.8   < > 4.5   < >  --  5.2*   < > 5.3*   < > 5.1   < > 3.8 4.2  CL  --    < > 104  --  102  --  103  --   --   --   --   --   --  105  --   --  100  CO2  --    < > 23  --  23  --  22  --   --   --   --   --   --  23  --   --  25  GLUCOSE  --    < > 117*  --  179*  --  180*  --   --   --   --   --   --  263*  --   --  194*  BUN  --    < > 23*  --  23*  --  25*  --   --   --   --   --   --  65*  --   --  49*  CREATININE  --    < > 2.02*  --  1.92*  --  1.84*  --   --   --   --   --   --  4.96*  --   --  3.99*  CALCIUM  --    < > 8.7*  --  8.7*  --  8.9  --   --   --   --   --   --  9.1  --   --  9.0  MG 2.7*  --  2.7*  --   --   --  2.6*  --  2.7*  --   --   --   --  2.9*  --   --   --   PHOS 3.4   < > 3.4  --  4.5  --  2.6  --   --   --   --   --   --  5.6*  --   --  4.8*   < > = values in this interval not displayed.   Estimated Creatinine Clearance: 19.4 mL/min (A) (by C-G formula based on SCr of 3.99 mg/dL (H)).   LIVER Recent Labs  Lab 03/07/20 0458 03/07/20 1547 03/08/20 0432 03/10/20 0352 03/11/20 0407  ALBUMIN 1.8* 1.8* 1.8* 1.4* 1.2*     INFECTIOUS Recent Labs  Lab 03/09/20 1534 03/10/20 0351 03/10/20 0352 03/11/20 0407  LATICACIDVEN  --  1.7  --   --   PROCALCITON 4.36  --  8.39 10.82     ENDOCRINE CBG (last 3)  Recent Labs    03/11/20 1017 03/11/20 1150  03/11/20 1227  GLUCAP 176* 197* 178*         IMAGING x48h  - image(s) personally visualized  -   highlighted in bold DG CHEST PORT 1 VIEW  Result Date: 03/11/2020 CLINICAL DATA:  COVID-19. EXAM: PORTABLE CHEST 1 VIEW COMPARISON:  Chest radiograph 03/09/2020 FINDINGS: ET tube terminates at the carina, recommend retraction. Enteric tube courses inferior to the diaphragm. Right IJ central  venous catheter tip projects over the superior vena cava. Dialysis catheter projects over the right atrium. Stable cardiac and mediastinal contours. Similar diffuse bilateral airspace opacities. No pleural effusion or pneumothorax. IMPRESSION: 1. ET tube terminates at the carina, recommend retraction. 2. Stable bilateral airspace opacities. 3. These results will be called to the ordering clinician or representative by the Radiologist Assistant, and communication documented in the PACS or [employer]. Electronically Signed   By: Lovey Newcomer M.D.   On: 03/11/2020 11:25

## 2020-03-11 NOTE — Progress Notes (Signed)
[facility] Progress Note  Subjective:  Tmax 102. HD yesterday, 2 L off. On levo gtt at 10 ug/ min.   Vitals:   03/11/20 0700 03/11/20 0755 03/11/20 0800 03/11/20 0806  BP:    (!) 139/59  Pulse: 88  97 97  Resp: (!) 30  (!) 26 (!) 36  Temp:  (!) 100.6 F (38.1 C)    TempSrc:  Oral    SpO2: 97%  97% 100%  Weight:      Height:        Exam: on vent ,sedated  no jvd  throat ett in place  Chest cta bilat and lat  Cor reg no RG  Abd soft ntnd no ascites obese   Ext no LE or UE edema   Neuro on vent and sedated    AVF L arm+ bruit    OP HD: TTS  4h 42mn 450/800  2/2.25 bath  111.5kg  Hep 2000  L AVF  - darbe 50 ug q week, last 9/7  - calc 1.0 tiw  - 9/9 Hb 10.0, tsat 22%  Assessment/ Plan: # COVID pna w/ acute on chronic hypoxic respiratory failure:  dexamethasone, status post tocilizumab and remdesivir.  Severe CXR changes.    # Acute hypoxic resp failure  - extubated on 10/6 > reintubated on 10/8 - per primary team  #  Staph aureus VAP/ HCAP 03/09/20 - restarted on IV abx vanc/zosyn per CCM  # ESRD: usual HD is TTS.   - sp CRRT 9/28- 10/7 - last HD yesterday / Saturday - labs good, next HD most likely 10/12  # Clotted LUA AVF: note TDC placed by IR 9/11. Will need new perm access at some point when pt is more stable.  Tunneled catheter exchanged on 9/27 with IR.   # Septic shock - 2/2 covid  - back on levo gtt  # Volume status: no vol excess on exam , 7kg under dry wt - UF as needed w/ HD  # Anemia ckd / critical illness: aranesp increased to 150 mcg weekly for now, last 10/5  # MBD ckd:  while on CRRT [place] the [place]. Pt back on vent. Ca and phos in range.   #DM2 - management per primary team  RKelly Splinter MD 03/11/2020, 10:02 AM           Recent Labs  Lab 03/10/20 0352 03/10/20 0352 03/10/20 1509 03/11/20 0211 03/11/20 0407  K 5.1   < >  --  3.8 4.2  BUN 65*  --   --   --  49*  CREATININE 4.96*   --   --   --  3.99*  CALCIUM 9.1  --   --   --  9.0  PHOS 5.6*  --   --   --  4.8*  HGB 6.4*   < > 8.2* 8.5*  --    < > = values in this interval not displayed.   Inpatient medications: . sodium chloride   Intravenous Once  . apixaban  2.5 mg Per Tube BID  . B-complex with vitamin C  1 tablet Per Tube Daily  . chlorhexidine gluconate (MEDLINE KIT)  15 mL Mouth Rinse BID  . Chlorhexidine Gluconate Cloth  6 each Topical Q0600  . darbepoetin (ARANESP) injection - DIALYSIS  150 mcg Intravenous Q Tue-HD  . docusate  100 mg Per Tube BID  . feeding supplement (PROSource TF)  45 mL Per Tube QID  . levothyroxine  125 mcg Per Tube Q0600  . mouth rinse  15 mL Mouth Rinse BID  . pantoprazole sodium  40 mg Per Tube BID  . polyethylene glycol  17 g Per Tube BID  . sodium chloride flush  10-40 mL Intracatheter Q12H  . sodium chloride flush  3 mL Intravenous Q12H  . vancomycin variable dose per unstable renal function (pharmacist dosing)   Does not apply See admin instructions   . sodium chloride 10 mL/hr at 03/11/20 0800  . sodium chloride    . dexmedetomidine (PRECEDEX) IV infusion Stopped (03/09/20 1712)  . feeding supplement (VITAL 1.5 CAL) 50 mL/hr at 03/11/20 0600  . fentaNYL infusion INTRAVENOUS 400 mcg/hr (03/11/20 0840)  . insulin 4.8 mL/hr at 03/11/20 0700  . midazolam 8 mg/hr (03/11/20 0800)  . norepinephrine (LEVOPHED) Adult infusion 10 mcg/min (03/11/20 0800)   sodium chloride, sodium chloride, acetaminophen (TYLENOL) oral liquid 160 mg/5 mL, albuterol, dextrose, fentaNYL, fentaNYL (SUBLIMAZE) injection, heparin, influenza vac split quadrivalent PF, lip balm, midazolam, sodium chloride flush, sodium chloride flush

## 2020-03-12 ENCOUNTER — Inpatient Hospital Stay (HOSPITAL_COMMUNITY): Payer: Medicaid Other

## 2020-03-12 DIAGNOSIS — J9601 Acute respiratory failure with hypoxia: Secondary | ICD-10-CM | POA: Diagnosis not present

## 2020-03-12 LAB — GLUCOSE, CAPILLARY
Glucose-Capillary: 185 mg/dL — ABNORMAL HIGH (ref 70–99)
Glucose-Capillary: 210 mg/dL — ABNORMAL HIGH (ref 70–99)
Glucose-Capillary: 216 mg/dL — ABNORMAL HIGH (ref 70–99)
Glucose-Capillary: 300 mg/dL — ABNORMAL HIGH (ref 70–99)
Glucose-Capillary: 318 mg/dL — ABNORMAL HIGH (ref 70–99)
Glucose-Capillary: 319 mg/dL — ABNORMAL HIGH (ref 70–99)
Glucose-Capillary: 320 mg/dL — ABNORMAL HIGH (ref 70–99)

## 2020-03-12 LAB — CBC
HCT: 25.1 % — ABNORMAL LOW (ref 36.0–46.0)
Hemoglobin: 8.2 g/dL — ABNORMAL LOW (ref 12.0–15.0)
MCH: 30.3 pg (ref 26.0–34.0)
MCHC: 32.7 g/dL (ref 30.0–36.0)
MCV: 92.6 fL (ref 80.0–100.0)
Platelets: 232 10*3/uL (ref 150–400)
RBC: 2.71 MIL/uL — ABNORMAL LOW (ref 3.87–5.11)
RDW: 19.2 % — ABNORMAL HIGH (ref 11.5–15.5)
WBC: 23.1 10*3/uL — ABNORMAL HIGH (ref 4.0–10.5)
nRBC: 0.1 % (ref 0.0–0.2)

## 2020-03-12 LAB — PHOSPHORUS: Phosphorus: 5.7 mg/dL — ABNORMAL HIGH (ref 2.5–4.6)

## 2020-03-12 MED ORDER — VITAL 1.5 CAL PO LIQD
1000.0000 mL | ORAL | Status: DC
  Administered 2020-03-12 – 2020-03-13 (×2): 1000 mL
  Filled 2020-03-12 (×2): qty 1000

## 2020-03-12 MED ORDER — CHLORHEXIDINE GLUCONATE CLOTH 2 % EX PADS
6.0000 | MEDICATED_PAD | Freq: Every day | CUTANEOUS | Status: DC
  Administered 2020-03-12 – 2020-03-14 (×2): 6 via TOPICAL

## 2020-03-12 MED ORDER — PROSOURCE TF PO LIQD
45.0000 mL | Freq: Three times a day (TID) | ORAL | Status: DC
  Administered 2020-03-12 – 2020-03-15 (×9): 45 mL
  Filled 2020-03-12 (×9): qty 45

## 2020-03-12 MED ORDER — INSULIN ASPART 100 UNIT/ML ~~LOC~~ SOLN
10.0000 [IU] | SUBCUTANEOUS | Status: DC
  Administered 2020-03-12 – 2020-03-14 (×12): 10 [IU] via SUBCUTANEOUS

## 2020-03-12 MED ORDER — SODIUM CHLORIDE 3 % IN NEBU
4.0000 mL | INHALATION_SOLUTION | Freq: Two times a day (BID) | RESPIRATORY_TRACT | Status: AC
  Administered 2020-03-12 – 2020-03-15 (×6): 4 mL via RESPIRATORY_TRACT
  Filled 2020-03-12 (×6): qty 4

## 2020-03-12 MED ORDER — INSULIN DETEMIR 100 UNIT/ML ~~LOC~~ SOLN
40.0000 [IU] | Freq: Two times a day (BID) | SUBCUTANEOUS | Status: DC
  Administered 2020-03-12 – 2020-03-18 (×12): 40 [IU] via SUBCUTANEOUS
  Filled 2020-03-12 (×14): qty 0.4

## 2020-03-12 NOTE — Progress Notes (Signed)
Assisted tele visit to patient with family member.  Thomas, Cassandra Mcmanaman Renee, RN   

## 2020-03-12 NOTE — Progress Notes (Signed)
Inpatient Diabetes Program Recommendations  AACE/ADA: New Consensus Statement on Inpatient Glycemic Control (2015)  Target Ranges:  Prepandial:   less than 140 mg/dL      Peak postprandial:   less than 180 mg/dL (1-2 hours)      Critically ill patients:  140 - 180 mg/dL   Lab Results  Component Value Date   GLUCAP 319 (H) 03/12/2020   HGBA1C 12.8 (H) 02/14/2020    Review of Glycemic Control Results for ZAHIRAH, CHESLOCK (MRN [id number]) as of 03/12/2020 09:09  Ref. Range 03/11/2020 19:29 03/11/2020 23:59 03/12/2020 03:22 03/12/2020 07:22  Glucose-Capillary Latest Ref Range: 70 - 99 mg/dL 270 (H) 300 (H) 318 (H) 319 (H)   Diabetes history: Type 2 DM Outpatient Diabetes medications: Amaryl 4 mg QAM, Victoza 1.8 mg QD,  Current orders for Inpatient glycemic control: Levemir 24 units BID, Novolog 8 units Q4H, Novolog 0-15 units Q4H Vital 1.5 cal @ 50 ml/hr Inpatient Diabetes Program Recommendations:    Consider increasing Levemir to 28 units BID and adding Tradjenta 5 mg QD.   Thanks, Bronson Curb, MSN, RNC-OB Diabetes Coordinator [phone] (8a-5p)

## 2020-03-12 NOTE — Progress Notes (Signed)
[facility] ROUNDING NOTE   Subjective:   Brief history: This a 49 year old lady with a history of end-stage renal disease Monday Wednesday Friday dialysis chronic hypoxic respiratory failure, she is on 3 L nasal cannula at baseline.  She has a history of diabetes hypertension hyperlipidemia hypothyroidism.  She was diagnosed with Covid pneumonia 02/09/2020.  She was admitted 02/13/2020.  She required intubation and CRRT on vasopressors 03/02/2020.  Last hemodialysis 03/10/2020 with 2 L removed next dialysis will be 03/13/2020  Blood pressure 111/60 pulse 89 temperature 100.1 O2 sats 93% FiO2 40%  Sodium 138 potassium 5.1 chloride 100 CO2 24 BUN 74 creatinine 6 glucose 328 phosphorus 5.7 calcium 9.1 albumin 1.1 hemoglobin 8.2 WBC 23.1  Eliquis 2.5 mg twice daily, insulin sliding scale, Levemir 24 units every 12 hours, levothyroxine 125 mcg daily, Protonix 40 mg twice daily  IV norepinephrine IV Rocephin IV heparin       Objective:  Vital signs in last 24 hours:  Temp:  [100.1 F (37.8 C)-102.8 F (39.3 C)] 100.1 F (37.8 C) (10/11 0323) Pulse Rate:  [88-198] 89 (10/11 0600) Resp:  [7-38] 28 (10/11 0600) BP: (119-139)/(54-59) 119/54 (10/10 1323) SpO2:  [91 %-100 %] 93 % (10/11 0600) Arterial Line BP: (98-161)/(44-68) 113/51 (10/11 0600) FiO2 (%):  [40 %] 40 % (10/11 0205) Weight:  [108.2 kg] 108.2 kg (10/11 0351)  Weight change: 3 kg Filed Weights   03/10/20 0700 03/10/20 1038 03/12/20 0351  Weight: 107 kg 105.2 kg 108.2 kg    Intake/Output: I/O last 3 completed shifts: In: 4079.8 [I.V.:1953.2; Blood:315; Other:200; MP/NT:6144; IV Piggyback:346.6] Out: 2000 [Other:2000]   Intake/Output this shift:  Total I/O In: 706.4 [I.V.:256.4; NG/GT:450] Out: -   Critically ill obese CVS- RRR without murmurs rubs or gallops RS- CTA ventilator dependent ETT tube ABD- BS present soft non-distended   EXT- no edema   Basic Metabolic Panel: Recent Labs  Lab  03/06/20 0432 03/06/20 1713 03/07/20 0458 03/07/20 0458 03/07/20 1547 03/07/20 1816 03/08/20 0432 03/08/20 0432 03/09/20 0521 03/09/20 1510 03/09/20 2053 03/10/20 0352 03/11/20 0211 03/11/20 0407 03/12/20 0403  NA  --    < > 137   < > 137   < > 137  --   --    < > 138 141 138 137 138  K  --    < > 4.0   < > 4.8   < > 4.5  --   --    < > 5.3* 5.1 3.8 4.2 5.1  CL  --    < > 104   < > 102  --  103  --   --   --   --  105  --  100 100  CO2  --    < > 23   < > 23  --  22  --   --   --   --  23  --  25 24  GLUCOSE  --    < > 117*   < > 179*  --  180*  --   --   --   --  263*  --  194* 328*  BUN  --    < > 23*   < > 23*  --  25*  --   --   --   --  65*  --  49* 74*  CREATININE  --    < > 2.02*   < > 1.92*  --  1.84*  --   --   --   --  4.96*  --  3.99* 6.04*  CALCIUM  --    < > 8.7*   < > 8.7*   < > 8.9   < >  --   --   --  9.1  --  9.0 9.1  MG 2.7*  --  2.7*  --   --   --  2.6*  --  2.7*  --   --  2.9*  --   --   --   PHOS 3.4   < > 3.4   < > 4.5  --  2.6  --   --   --   --  5.6*  --  4.8* 5.7*   < > = values in this interval not displayed.    Liver Function Tests: Recent Labs  Lab 03/07/20 1547 03/08/20 0432 03/10/20 0352 03/11/20 0407 03/12/20 0403  AST  --   --   --   --  19  ALT  --   --   --   --  37  ALKPHOS  --   --   --   --  281*  BILITOT  --   --   --   --  1.3*  PROT  --   --   --   --  5.7*  ALBUMIN 1.8* 1.8* 1.4* 1.2* 1.1*   No results for input(s): LIPASE, AMYLASE in the last 168 hours. No results for input(s): AMMONIA in the last 168 hours.  CBC: Recent Labs  Lab 03/07/20 0458 03/07/20 1816 03/08/20 0432 03/08/20 0432 03/09/20 0521 03/09/20 1510 03/09/20 2053 03/10/20 0352 03/10/20 1509 03/11/20 0211 03/12/20 0403  WBC 11.0*  --  13.6*  --  11.2*  --   --  17.9*  --   --  23.1*  HGB 7.6*   < > 7.6*   < > 7.5*   < > 7.1* 6.4* 8.2* 8.5* 8.2*  HCT 24.8*   < > 24.2*   < > 24.3*   < > 21.0* 20.5* 25.4* 25.0* 25.1*  MCV 100.0  --  98.4  --  98.0  --    --  98.1  --   --  92.6  PLT 81*  --  115*  --  142*  --   --  190  --   --  232   < > = values in this interval not displayed.    Cardiac Enzymes: No results for input(s): CKTOTAL, CKMB, CKMBINDEX, TROPONINI in the last 168 hours.  BNP: Invalid input(s): POCBNP  CBG: Recent Labs  Lab 03/11/20 1500 03/11/20 1603 03/11/20 1929 03/11/20 2359 03/12/20 0322  GLUCAP 173* 200* 270* 300* 45*    Microbiology: Results for orders placed or performed during the hospital encounter of 02/13/20  Culture, blood (Routine x 2)     Status: None   Collection Time: 02/13/20  7:02 PM   Specimen: BLOOD RIGHT HAND  Result Value Ref Range Status   Specimen Description BLOOD RIGHT HAND  Final   Special Requests   Final    BOTTLES DRAWN AEROBIC AND ANAEROBIC Blood Culture adequate volume   Culture   Final    NO GROWTH 5 DAYS Performed at [facility], [place] [address], [place], [place] [address]    Report Status 02/18/2020 FINAL  Final  SARS Coronavirus 2 by RT PCR (hospital order, performed in [facility] hospital lab) Nasopharyngeal Nasopharyngeal Swab     Status: Abnormal   Collection Time: 02/14/20  8:33  AM   Specimen: Nasopharyngeal Swab  Result Value Ref Range Status   SARS Coronavirus 2 POSITIVE (A) NEGATIVE Final    Comment: RESULT CALLED TO, READ BACK BY AND VERIFIED WITH: RN T.SHORE AT 1110 ON 02/14/2020 BY T.SAAD (NOTE) SARS-CoV-2 target nucleic acids are DETECTED  SARS-CoV-2 RNA is generally detectable in upper respiratory specimens  during the acute phase of infection.  Positive results are indicative  of the presence of the identified virus, but do not rule out bacterial infection or co-infection with other pathogens not detected by the test.  Clinical correlation with patient history and  other diagnostic information is necessary to determine patient infection status.  The expected result is negative.  Fact Sheet for Patients:    StrictlyIdeas.no   Fact Sheet for Healthcare Providers:   BankingDealers.co.za    This test is not yet approved or cleared by the [place] FDA and  has been authorized for detection and/or diagnosis of SARS-CoV-2 by FDA under an Emergency Use Authorization (EUA).  This EUA will remain in effect (meaning  this test can be used) for the duration of  the COVID-19 declaration under Section 564(b)(1) of the Act, 21 U.S.C. section 360-bbb-3(b)(1), unless the authorization is terminated or revoked sooner.  Performed at [facility], [place] [address] [place], [place] [address]   Culture, blood (Routine x 2)     Status: None   Collection Time: 02/14/20 10:39 AM   Specimen: BLOOD RIGHT FOREARM  Result Value Ref Range Status   Specimen Description BLOOD RIGHT FOREARM  Final   Special Requests   Final    BOTTLES DRAWN AEROBIC AND ANAEROBIC Blood Culture adequate volume   Culture   Final    NO GROWTH 5 DAYS Performed at [facility], [place] [address] [place], [place] [address]    Report Status 02/19/2020 FINAL  Final  Culture, blood (Routine X 2) w Reflex to ID Panel     Status: None   Collection Time: 02/26/20  4:25 PM   Specimen: BLOOD RIGHT HAND  Result Value Ref Range Status   Specimen Description BLOOD RIGHT HAND  Final   Special Requests   Final    BOTTLES DRAWN AEROBIC AND ANAEROBIC Blood Culture adequate volume   Culture   Final    NO GROWTH 5 DAYS Performed at [facility], [place] [address], [place], [place] [address]    Report Status 03/02/2020 FINAL  Final  Culture, blood (Routine X 2) w Reflex to ID Panel     Status: None   Collection Time: 02/26/20  4:31 PM   Specimen: BLOOD RIGHT ARM  Result Value Ref Range Status   Specimen Description BLOOD RIGHT ARM  Final   Special Requests   Final    BOTTLES DRAWN AEROBIC ONLY Blood Culture results may not be optimal due to an inadequate volume of blood received in  culture bottles   Culture   Final    NO GROWTH 5 DAYS Performed at [facility], [place] [address] [place], [place] [address]    Report Status 03/02/2020 FINAL  Final  MRSA PCR Screening     Status: None   Collection Time: 02/27/20  9:14 AM   Specimen: Nasopharyngeal  Result Value Ref Range Status   MRSA by PCR NEGATIVE NEGATIVE Final    Comment:        The GeneXpert MRSA Assay (FDA approved for NASAL specimens only), is one component of a comprehensive  MRSA colonization surveillance program. It is not intended to diagnose MRSA infection nor to guide or monitor treatment for MRSA infections. Performed at [facility], [place] [address], [place], [place] [address]   Culture, respiratory (non-expectorated)     Status: None   Collection Time: 03/09/20  3:01 PM   Specimen: Tracheal Aspirate; Respiratory  Result Value Ref Range Status   Specimen Description TRACHEAL ASPIRATE  Final   Special Requests NONE  Final   Gram Stain   Final    ABUNDANT WBC PRESENT,BOTH PMN AND MONONUCLEAR ABUNDANT GRAM POSITIVE COCCI ABUNDANT GRAM NEGATIVE RODS FEW GRAM VARIABLE ROD Performed at [facility], [place] [address], [place], [place] [address]    Culture ABUNDANT STAPHYLOCOCCUS AUREUS  Final   Report Status 03/11/2020 FINAL  Final   Organism ID, Bacteria STAPHYLOCOCCUS AUREUS  Final      Susceptibility   Staphylococcus aureus - MIC*    CIPROFLOXACIN <=0.5 SENSITIVE Sensitive     ERYTHROMYCIN >=8 RESISTANT Resistant     GENTAMICIN <=0.5 SENSITIVE Sensitive     OXACILLIN <=0.25 SENSITIVE Sensitive     TETRACYCLINE <=1 SENSITIVE Sensitive     VANCOMYCIN <=0.5 SENSITIVE Sensitive     TRIMETH/SULFA <=10 SENSITIVE Sensitive     CLINDAMYCIN RESISTANT Resistant     RIFAMPIN <=0.5 SENSITIVE Sensitive     Inducible Clindamycin POSITIVE Resistant     * ABUNDANT STAPHYLOCOCCUS AUREUS  Culture, blood (Routine X 2) w Reflex to ID Panel     Status: None (Preliminary result)   Collection  Time: 03/09/20  3:15 PM   Specimen: BLOOD RIGHT HAND  Result Value Ref Range Status   Specimen Description BLOOD RIGHT HAND  Final   Special Requests   Final    BOTTLES DRAWN AEROBIC AND ANAEROBIC Blood Culture adequate volume   Culture   Final    NO GROWTH 2 DAYS Performed at [facility], [address], [place], [place] [address]    Report Status PENDING  Incomplete  Culture, blood (Routine X 2) w Reflex to ID Panel     Status: None (Preliminary result)   Collection Time: 03/09/20  3:30 PM   Specimen: BLOOD RIGHT HAND  Result Value Ref Range Status   Specimen Description BLOOD RIGHT HAND  Final   Special Requests   Final    BOTTLES DRAWN AEROBIC AND ANAEROBIC Blood Culture adequate volume   Culture   Final    NO GROWTH 2 DAYS Performed at [facility], [place] [address], [place], [place] [address]    Report Status PENDING  Incomplete    Coagulation Studies: No results for input(s): LABPROT, INR in the last 72 hours.  Urinalysis: No results for input(s): COLORURINE, LABSPEC, PHURINE, GLUCOSEU, HGBUR, BILIRUBINUR, KETONESUR, PROTEINUR, UROBILINOGEN, NITRITE, LEUKOCYTESUR in the last 72 hours.  Invalid input(s): APPERANCEUR    Imaging: DG CHEST PORT 1 VIEW  Result Date: 03/12/2020 CLINICAL DATA:  COVID positive. EXAM: PORTABLE CHEST 1 VIEW COMPARISON:  03/11/2020 FINDINGS: 0426 hours. Endotracheal tube tip is 2.7 cm above the base of the carina. A feeding tube passes into the stomach although the distal tip position is not included on the film. Left IJ dialysis catheter tip overlies the upper right atrium. Right IJ central line tip overlies the innominate vein confluence. Apparent new left subclavian catheter/sheath. Diffuse bilateral airspace disease, right greater than left, similar to prior. No pneumothorax or pleural effusion evident on the current exam. Telemetry leads overlie the chest.  IMPRESSION: Stable exam. Right greater than left diffuse airspace disease  without substantial change. Support apparatus as above. Electronically Signed   By: Misty Stanley M.D.   On: 03/12/2020 06:31   DG CHEST PORT 1 VIEW  Result Date: 03/11/2020 CLINICAL DATA:  COVID-19. EXAM: PORTABLE CHEST 1 VIEW COMPARISON:  Chest radiograph 03/09/2020 FINDINGS: ET tube terminates at the carina, recommend retraction. Enteric tube courses inferior to the diaphragm. Right IJ central venous catheter tip projects over the superior vena cava. Dialysis catheter projects over the right atrium. Stable cardiac and mediastinal contours. Similar diffuse bilateral airspace opacities. No pleural effusion or pneumothorax. IMPRESSION: 1. ET tube terminates at the carina, recommend retraction. 2. Stable bilateral airspace opacities. 3. These results will be called to the ordering clinician or representative by the Radiologist Assistant, and communication documented in the PACS or [employer]. Electronically Signed   By: Lovey Newcomer M.D.   On: 03/11/2020 11:25     Medications:   . sodium chloride Stopped (03/11/20 1509)  . sodium chloride    . cefTRIAXone (ROCEPHIN)  IV Stopped (03/11/20 1232)  . dexmedetomidine (PRECEDEX) IV infusion Stopped (03/09/20 1712)  . dextrose    . feeding supplement (VITAL 1.5 CAL) 1,000 mL (03/12/20 0410)  . fentaNYL infusion INTRAVENOUS 400 mcg/hr (03/12/20 0600)  . midazolam 8 mg/hr (03/12/20 0600)  . norepinephrine (LEVOPHED) Adult infusion 7 mcg/min (03/12/20 0600)   . sodium chloride   Intravenous Once  . apixaban  2.5 mg Per Tube BID  . B-complex with vitamin C  1 tablet Per Tube Daily  . chlorhexidine gluconate (MEDLINE KIT)  15 mL Mouth Rinse BID  . Chlorhexidine Gluconate Cloth  6 each Topical Q0600  . darbepoetin (ARANESP) injection - DIALYSIS  150 mcg Intravenous Q Tue-HD  . docusate  100 mg Per Tube BID  . feeding supplement (PROSource TF)  45 mL Per Tube QID  . insulin aspart  0-15 Units Subcutaneous Q4H  . insulin aspart  8 Units  Subcutaneous Q4H  . insulin detemir  24 Units Subcutaneous Q12H  . levothyroxine  125 mcg Per Tube Q0600  . mouth rinse  15 mL Mouth Rinse 10 times per day  . pantoprazole sodium  40 mg Per Tube BID  . polyethylene glycol  17 g Per Tube BID  . sodium chloride flush  10-40 mL Intracatheter Q12H  . sodium chloride flush  3 mL Intravenous Q12H  . vancomycin variable dose per unstable renal function (pharmacist dosing)   Does not apply See admin instructions   sodium chloride, sodium chloride, acetaminophen (TYLENOL) oral liquid 160 mg/5 mL, albuterol, dextrose, dextrose, fentaNYL, fentaNYL (SUBLIMAZE) injection, heparin, influenza vac split quadrivalent PF, lip balm, midazolam, sodium chloride flush, sodium chloride flush   OP HD: TTS  4h 52mn 450/800  2/2.25 bath  111.5kg  Hep 2000  L AVF  - darbe 50 ug q week, last 9/7  - calc 1.0 tiw  - 9/9 Hb 10.0, tsat 22%  Assessment/ Plan:  # COVID pna w/ acute on chronic hypoxic respiratory failure:  dexamethasone, status post tocilizumab and remdesivir.  Severe CXR changes.    # Acute hypoxic resp failure  - extubated on 10/6 > reintubated on 10/8 - per primary team  #  Staph aureus VAP/ HCAP 03/09/20 - restarted on IV abx Rocephin  # ESRD:usual HD is TTS.   - sp CRRT 9/28- 10/7 - last HD 03/10/2020 with 2 L removed - labs good, next HD most likely 10/12  #  Clotted LUA AVF: note TDC placed by IR 9/11. Will need new perm access at some point when pt is more stable. Tunneled catheter exchanged on 9/27 with IR.   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 Pt back on vent. Ca and phos in range.   #DM2 - management per primary team    LOS: [place] '@TODAY' '@6' :38 AM

## 2020-03-12 NOTE — Progress Notes (Signed)
Nutrition Follow-up  DOCUMENTATION CODES:   Morbid obesity  INTERVENTION:   Continue tube feeding via Cortrak: Vital 1.5 increase goal rate to 55 ml/h (1320 ml per day) Prosource TF 45 ml TID  Provides 2100 kcal, 122 gm protein, 1008 ml free water daily.  Continue B-complex with vitamin C via tube.  NUTRITION DIAGNOSIS:   Inadequate oral intake related to inability to eat as evidenced by NPO status.  Ongoing   GOAL:   Patient will meet greater than or equal to 90% of their needs  Progressing   MONITOR:   PO intake, Supplement acceptance, Labs  REASON FOR ASSESSMENT:   Consult Assessment of nutrition requirement/status  ASSESSMENT:   49 year old female with a past medical history significant for end-stage renal disease on hemodialysis MWF, chronic hypoxic respiratory failure on 3 L nasal cannula at baseline, sleep apnea, diabetes, hypertension, hyperlipidemia, hypothyroidism, and asthma presents with complaints of worsening shortness of breath. Previously diagnosed with COVID PNA on 9/9.  Patient was re-intubated 10/8. Last HD was 10/9, 2 L removed. Next HD planned 10/12. Cortrak placed 10/4, tip in the stomach. Currently receiving Vital 1.5 via Cortrak at 50 ml/h (1200 ml/day) with Prostat 45 ml QID to provide 1960 kcals, 125 gm protein, 917 ml free water daily.   Patient is currently intubated on ventilator support MV: 7.1 L/min Temp (24hrs), Avg:101 F (38.3 C), Min:99.6 F (37.6 C), Max:102.8 F (39.3 C)   Labs reviewed. BUN 74, Creat 6.04, Phos 5.7 CBG: [phone]  Medications reviewed and include B-complex with C, Aranesp, Colace, Novolog, Levemir, Levophed.  I/O +6,373 ml since admission  Diet Order:   Diet Order    None      EDUCATION NEEDS:   Not appropriate for education at this time  Skin:  Skin Assessment: Skin Integrity Issues: Skin Integrity Issues:: Stage II Stage II: R buttocks  Last BM:  10/10 Rectal tube  Height:   Ht  Readings from Last 1 Encounters:  03/09/20 5\' 2"  (1.575 m)    Weight:   Wt Readings from Last 1 Encounters:  03/12/20 108.2 kg    Ideal Body Weight:  50 kg  BMI:  Body mass index is 43.63 kg/m.  Estimated Nutritional Needs:   Kcal:  2065-2260  Protein:  110-125 gm  Fluid:  1 L + UOP    Lucas Mallow, RD, LDN, CNSC Please refer to Amion for contact information.

## 2020-03-12 NOTE — Progress Notes (Signed)
NAME:  Wanda Ramirez, MRN:  [id number], DOB:  23-Jan-1971, LOS: 29 ADMISSION DATE:  02/13/2020, CONSULTATION DATE: 02/24/2020 REFERRING MD: Dr. Heber [place], CHIEF COMPLAINT: Increasing supplemental oxygen demand  Brief History   Wanda Ramirez is a 49 year old female with a past medical history significant for end-stage renal disease on hemodialysis MWF, chronic hypoxic respiratory failure on 3 L nasal cannula at baseline, sleep apnea, diabetes, hypertension, hyperlipidemia, hypothyroidism, and asthma who presented to the emergency department with complaints of worsening shortness of breath, dry cough, generalized body aches, and loose stools.  Patient was diagnosed 9/9 with Covid pneumonia.  Initially she was able to maintain outpatient status but when symptoms worsened 9/14 she presented to the emergency department.  Patient reported dyspnea and weakness worsened to the point where she was unable to ambulate effectively.  She also reports she was unable to maintain adequate oral intake.  Patient received the moderna vaccines in March and April of this year.  Patient presented initially febrile with a temperature of 102.9 and mildly tachypneic with all other vital signs within normal limits.  She was initially managed on 4 L nasal cannula.  Lab work significant for NA 130, K3.8, glucose 454, anion gap 21, lactic acid 1.8, LDH 296, ferritin 6690, CRP 13.3, and procalcitonin 10.51  On review of medical record it appears patient has progressively required increased supplemental oxygen requirement.  Afternoon of 9/24 patient had episode of desaturation requiring further increase in supplemental oxygen.  On chart assessment it appears patient has been placed on 40 L with reported increased work of breathing prompting PCCM consultation  Past Medical History  Sleep apnea Morbid obesity Hypothyroidism Hypertension GERD  End-stage renal disease on HD MWF Diabetes Asthma   Immunization History   Administered Date(s) Administered  . Influenza,inj,Quad PF,6+ Mos 02/09/2018     Significant Hospital Events   Admitted 9/14  9/24 - Lying in bed on side currently undergoing iHD,  She is lethargic but will arouse to verbal stimuli but quickly falls back to sleep.  RN states patient has had progressive decline over the last 12hrs 9/25 - -currently in medical ICU to [facility].  Bed 12.  She has been on BiPAP with Precedex overnight.  This morning on the low-dose of Precedex she got agitated.?  Also had melena.  She also desaturated.  Nursing then increase her Precedex and since then she has been more stable.  Yesterday dialysis was cut short.  9/26 -unable to run CRRT with prone ventilation.  Resume supine ventilation but still unable to draw blood back. 9/27 transferred to 22m. IR replaced HD catheter 10/1 on CRRT and vasopressors 10/6 - extubated 10/7 - ad some stridor and increased WOB so placed on BIPAP and dexamethasone given.  Consults:  PCCM  Procedures:  9/23 IR guided tunneled HD cath placement - left subclavian ?  Date right radial art line ?  Date right IJ 10/6 extubated 10/ 8 -reintubated.  Febrile to 103.  Started on cultures and antibiotics.  10/9 -  on vent 40%. Fent gtt, Precedex gtt, On levophed gtt - coming to nearly off. Fever coming down. RN reporting discolored mucus from lung. Tolerated HD despite perssors  Significant Diagnostic Tests:  Pulmonary VQ scan at 9/21 > negative  Micro Data:   03/09/2020 -tracheal aspirate -abundant Staphylococcus -MSSA 03/09/2020-blood culture 0 negative as of 03/12/2020    Antimicrobials:  Cefepime 9/24 > Azithromycin 9/21 > 9/26 Remdesivir completed 9/17 > 9/19 Tocilizumab x1  xxx Vancomycin 03/09/2020-03/11/2020 Zosyn  03/09/2020-03/11/2020 Ceftriaxone 03/11/2020 >>  Interim history/subjective:   Improving oxygen requirements.  Tolerating spontaneous breathing at high PSV.  Objective   Blood pressure (!) 116/56,  pulse 99, temperature 99.5 F (37.5 C), temperature source Axillary, resp. rate 15, height 5\' 2"  (1.575 m), weight 108.2 kg, SpO2 91 %.    Vent Mode: PSV;CPAP FiO2 (%):  [40 %] 40 % Set Rate:  [35 bmp] 35 bmp Vt Set:  [440 mL] 440 mL PEEP:  [8 cmH20] 8 cmH20 Pressure Support:  [20 cmH20] 20 cmH20 Plateau Pressure:  [24 cmH20-28 cmH20] 27 cmH20   Intake/Output Summary (Last 24 hours) at 03/12/2020 1423 Last data filed at 03/12/2020 1400 Gross per 24 hour  Intake 1136.52 ml  Output --  Net 1136.52 ml   Filed Weights   03/10/20 0700 03/10/20 1038 03/12/20 0351  Weight: 107 kg 105.2 kg 108.2 kg   General Appearance:  Looks criticall ill OBESE - + Head:  Normocephalic, without obvious abnormality, atraumatic Eyes:  PERRL - yes, conjunctiva/corneas - muddy     Ears:  Normal external ear canals, both ears Nose:  G tube - yes Throat:  ETT TUBE - YES , OG tube - no Neck:  Supple,  No enlargement/tenderness/nodules Lungs: Clear to auscultation bilaterally, Ventilator   Synchrony - yes, 40% fio2, peep 8 Heart:  S1 and S2 normal, no murmur, CVP - x.  Pressors - levophed Abdomen:  Soft, no masses, no organomegaly Genitalia / Rectal:  Not done Extremities:  Extremities- intact Skin:  ntact in exposed areas . Sacral area - not examined Neurologic:  Sedation - gtt -> RASS - -4 . Moves all 4s - yes. CAM-ICU - NA . Orientation - not oriented   Resolved Hospital Problem list   Mixed metabolic/respiratory acidosis  Acute on chronic Anemia likely component of chronic disease with blood loss no obvious signs of bleeding s/p 2 u prbc on 9/28 - Stable, monitor  Shock, undifferentiated, improved Patient's echocardiogram shows RV overload.  Could be RV stunning from PVR increase due to COVID +/- septic effect of COVID, regardless, improved. She is off antibiotics - Resolved  Assessment & Plan:   Acute hypoxic/hypercapnic respiratory failure due to ARDS secondary to COVID 19  Pneumonia Patient has completed steroid, remdesivir, immunomodulating therapy. Extubated 10/6 . Reintubated 03/09/20 - due to MSSA VAP -PRVC support -Ventilator associated pneumonia bundle -May need tracheostomy particularly given that major issue appears to be agitation.   Circulatory shock Plan  - levophed for MAP > 65   MSSA VAP/HCAP  03/09/20 Plan - change to ceftriaxone and treat for 7 days   ESRD on HD  - s/p CRRT ending 03/08/20   iHD per renal  Thrombocytopenia, at risk VTE- - eliquis 2.5mg  BID ( as laid out 03/08/20 by DR Tamala Julian) x 1 month sinc discharge   Poorly controlled diabetes with hyperglycemia, A1c 12.8% on admission -Better controlled with SSI and long-acting   Best practice:  Diet: TF, swallow screen if weans from BIPAP Pain/Anxiety/Delirium protocol (if indicated): off VAP protocol (if indicated): off DVT prophylaxis: see above GI prophylaxis: PPI Glucose control: see above Mobility:  Bed rest Code Status: Full Family Communication:  Daughter Carlina Derks (the one RN says MD shoulod commuicate with) -> updated 03/10/20 and 03/11/20. Expl;ained concept of secondary pneumonia. Explained need for trach/ltac Disposition:  ICU. Marland KitchenCan go to non-covid ICU. Needs Trach/LTAC    LABS    PULMONARY Recent Labs  Lab 03/07/20 1816 03/09/20 1510 03/09/20 2053 03/11/20  0211  PHART 7.366 7.193* 7.346* 7.421  PCO2ART 41.7 74.4* 49.1* 42.0  PO2ART 71* 142* 116* 108  HCO3 24.0 27.7 26.6 27.2  TCO2 25 30 28 28   O2SAT 94.0 98.0 98.0 98.0    CBC Recent Labs  Lab 03/09/20 0521 03/09/20 1510 03/10/20 0352 03/10/20 0352 03/10/20 1509 03/11/20 0211 03/12/20 0403  HGB 7.5*   < > 6.4*   < > 8.2* 8.5* 8.2*  HCT 24.3*   < > 20.5*   < > 25.4* 25.0* 25.1*  WBC 11.2*  --  17.9*  --   --   --  23.1*  PLT 142*  --  190  --   --   --  232   < > = values in this interval not displayed.    COAGULATION No results for input(s): INR in the last 168  hours.  CARDIAC  No results for input(s): TROPONINI in the last 168 hours. No results for input(s): PROBNP in the last 168 hours.   CHEMISTRY Recent Labs  Lab 03/06/20 0432 03/06/20 1713 03/07/20 0458 03/07/20 0458 03/07/20 1547 03/07/20 1816 03/08/20 0432 03/09/20 0521 03/09/20 1510 03/09/20 2053 03/09/20 2053 03/10/20 0352 03/10/20 0352 03/11/20 0211 03/11/20 0211 03/11/20 0407 03/12/20 0403  NA  --    < > 137   < > 137   < > 137  --    < > 138  --  141  --  138  --  137 138  K  --    < > 4.0   < > 4.8   < > 4.5  --    < > 5.3*   < > 5.1   < > 3.8   < > 4.2 5.1  CL  --    < > 104   < > 102  --  103  --   --   --   --  105  --   --   --  100 100  CO2  --    < > 23   < > 23  --  22  --   --   --   --  23  --   --   --  25 24  GLUCOSE  --    < > 117*   < > 179*  --  180*  --   --   --   --  263*  --   --   --  194* 328*  BUN  --    < > 23*   < > 23*  --  25*  --   --   --   --  65*  --   --   --  49* 74*  CREATININE  --    < > 2.02*   < > 1.92*  --  1.84*  --   --   --   --  4.96*  --   --   --  3.99* 6.04*  CALCIUM  --    < > 8.7*   < > 8.7*  --  8.9  --   --   --   --  9.1  --   --   --  9.0 9.1  MG 2.7*  --  2.7*  --   --   --  2.6* 2.7*  --   --   --  2.9*  --   --   --   --   --  PHOS 3.4   < > 3.4   < > 4.5  --  2.6  --   --   --   --  5.6*  --   --   --  4.8* 5.7*   < > = values in this interval not displayed.   Estimated Creatinine Clearance: 13 mL/min (A) (by C-G formula based on SCr of 6.04 mg/dL (H)).   LIVER Recent Labs  Lab 03/07/20 1547 03/08/20 0432 03/10/20 0352 03/11/20 0407 03/12/20 0403  AST  --   --   --   --  19  ALT  --   --   --   --  37  ALKPHOS  --   --   --   --  281*  BILITOT  --   --   --   --  1.3*  PROT  --   --   --   --  5.7*  ALBUMIN 1.8* 1.8* 1.4* 1.2* 1.1*     INFECTIOUS Recent Labs  Lab 03/09/20 1534 03/10/20 0351 03/10/20 0352 03/11/20 0407  LATICACIDVEN  --  1.7  --   --   PROCALCITON 4.36  --  8.39 10.82      ENDOCRINE CBG (last 3)  Recent Labs    03/12/20 0322 03/12/20 0722 03/12/20 1116  GLUCAP 318* 319* 210*   CRITICAL CARE Performed by: Kipp Brood   Total critical care time: 40 minutes  Critical care time was exclusive of separately billable procedures and treating other patients.  Critical care was necessary to treat or prevent imminent or life-threatening deterioration.  Critical care was time spent personally by me on the following activities: development of treatment plan with patient and/or surrogate as well as nursing, discussions with consultants, evaluation of patient's response to treatment, examination of patient, obtaining history from patient or surrogate, ordering and performing treatments and interventions, ordering and review of laboratory studies, ordering and review of radiographic studies, pulse oximetry, re-evaluation of patient's condition and participation in multidisciplinary rounds.  Kipp Brood, MD [facility] ICU Physician [place]  Pager: [phone] Mobile: [phone] After hours: [phone].

## 2020-03-13 DIAGNOSIS — J9601 Acute respiratory failure with hypoxia: Secondary | ICD-10-CM | POA: Diagnosis not present

## 2020-03-13 LAB — POCT I-STAT 7, (LYTES, BLD GAS, ICA,H+H)
Acid-base deficit: 1 mmol/L (ref 0.0–2.0)
Bicarbonate: 24.7 mmol/L (ref 20.0–28.0)
Calcium, Ion: 1.24 mmol/L (ref 1.15–1.40)
HCT: 23 % — ABNORMAL LOW (ref 36.0–46.0)
Hemoglobin: 7.8 g/dL — ABNORMAL LOW (ref 12.0–15.0)
O2 Saturation: 98 %
Patient temperature: 99.5
Potassium: 6.4 mmol/L (ref 3.5–5.1)
Sodium: 135 mmol/L (ref 135–145)
TCO2: 26 mmol/L (ref 22–32)
pCO2 arterial: 47.5 mmHg (ref 32.0–48.0)
pH, Arterial: 7.327 — ABNORMAL LOW (ref 7.350–7.450)
pO2, Arterial: 122 mmHg — ABNORMAL HIGH (ref 83.0–108.0)

## 2020-03-13 LAB — COMPREHENSIVE METABOLIC PANEL
ALT: 37 U/L (ref 0–44)
AST: 19 U/L (ref 15–41)
Albumin: 1.1 g/dL — ABNORMAL LOW (ref 3.5–5.0)
Alkaline Phosphatase: 281 U/L — ABNORMAL HIGH (ref 38–126)
Anion gap: 14 (ref 5–15)
BUN: 74 mg/dL — ABNORMAL HIGH (ref 6–20)
CO2: 24 mmol/L (ref 22–32)
Calcium: 9.1 mg/dL (ref 8.9–10.3)
Chloride: 100 mmol/L (ref 98–111)
Creatinine, Ser: 6.04 mg/dL — ABNORMAL HIGH (ref 0.44–1.00)
GFR, Estimated: 8 mL/min — ABNORMAL LOW (ref >60)
Glucose, Bld: 328 mg/dL — ABNORMAL HIGH (ref 70–99)
Potassium: 5.1 mmol/L (ref 3.5–5.1)
Sodium: 138 mmol/L (ref 135–145)
Total Bilirubin: 1.3 mg/dL — ABNORMAL HIGH (ref 0.3–1.2)
Total Protein: 5.7 g/dL — ABNORMAL LOW (ref 6.5–8.1)

## 2020-03-13 LAB — HEPATITIS B SURFACE ANTIGEN: Hepatitis B Surface Ag: NONREACTIVE

## 2020-03-13 LAB — GLUCOSE, CAPILLARY
Glucose-Capillary: 219 mg/dL — ABNORMAL HIGH (ref 70–99)
Glucose-Capillary: 228 mg/dL — ABNORMAL HIGH (ref 70–99)
Glucose-Capillary: 253 mg/dL — ABNORMAL HIGH (ref 70–99)
Glucose-Capillary: 257 mg/dL — ABNORMAL HIGH (ref 70–99)
Glucose-Capillary: 272 mg/dL — ABNORMAL HIGH (ref 70–99)
Glucose-Capillary: 275 mg/dL — ABNORMAL HIGH (ref 70–99)

## 2020-03-13 LAB — HEPATITIS B CORE ANTIBODY, TOTAL: Hep B Core Total Ab: NONREACTIVE

## 2020-03-13 MED ORDER — HEPARIN SODIUM (PORCINE) 1000 UNIT/ML IJ SOLN
4200.0000 [IU] | Freq: Once | INTRAMUSCULAR | Status: AC
  Administered 2020-03-13: 4200 [IU]
  Filled 2020-03-13: qty 4.2

## 2020-03-13 MED ORDER — NUTRISOURCE FIBER PO PACK
1.0000 | PACK | Freq: Two times a day (BID) | ORAL | Status: DC
  Administered 2020-03-13 – 2020-04-13 (×61): 1
  Filled 2020-03-13 (×64): qty 1

## 2020-03-13 MED ORDER — SODIUM CHLORIDE 0.9 % IV SOLN
1.0000 mg/h | INTRAVENOUS | Status: DC
  Administered 2020-03-13: 6 mg/h via INTRAVENOUS
  Administered 2020-03-13: 7 mg/h via INTRAVENOUS
  Administered 2020-03-14: 5 mg/h via INTRAVENOUS
  Administered 2020-03-15: 6 mg/h via INTRAVENOUS
  Administered 2020-03-16: 4 mg/h via INTRAVENOUS
  Filled 2020-03-13 (×8): qty 20

## 2020-03-13 MED ORDER — INSULIN ASPART 100 UNIT/ML ~~LOC~~ SOLN
0.0000 [IU] | SUBCUTANEOUS | Status: DC
  Administered 2020-03-13 (×2): 7 [IU] via SUBCUTANEOUS
  Administered 2020-03-13 (×2): 11 [IU] via SUBCUTANEOUS
  Administered 2020-03-14: 7 [IU] via SUBCUTANEOUS
  Administered 2020-03-14: 3 [IU] via SUBCUTANEOUS
  Administered 2020-03-14 – 2020-03-15 (×3): 4 [IU] via SUBCUTANEOUS
  Administered 2020-03-15: 3 [IU] via SUBCUTANEOUS
  Administered 2020-03-15: 7 [IU] via SUBCUTANEOUS
  Administered 2020-03-15: 4 [IU] via SUBCUTANEOUS
  Administered 2020-03-16 – 2020-03-17 (×3): 3 [IU] via SUBCUTANEOUS
  Administered 2020-03-17: 4 [IU] via SUBCUTANEOUS
  Administered 2020-03-17: 15 [IU] via SUBCUTANEOUS
  Administered 2020-03-17: 4 [IU] via SUBCUTANEOUS
  Administered 2020-03-18: 3 [IU] via SUBCUTANEOUS
  Administered 2020-03-18: 4 [IU] via SUBCUTANEOUS
  Administered 2020-03-18: 3 [IU] via SUBCUTANEOUS
  Administered 2020-03-19: 11 [IU] via SUBCUTANEOUS
  Administered 2020-03-19 (×2): 4 [IU] via SUBCUTANEOUS
  Administered 2020-03-19: 3 [IU] via SUBCUTANEOUS
  Administered 2020-03-19: 4 [IU] via SUBCUTANEOUS
  Administered 2020-03-20 (×4): 7 [IU] via SUBCUTANEOUS
  Administered 2020-03-20: 3 [IU] via SUBCUTANEOUS
  Administered 2020-03-21 (×2): 4 [IU] via SUBCUTANEOUS
  Administered 2020-03-21: 3 [IU] via SUBCUTANEOUS
  Administered 2020-03-21: 4 [IU] via SUBCUTANEOUS
  Administered 2020-03-22: 7 [IU] via SUBCUTANEOUS
  Administered 2020-03-22: 4 [IU] via SUBCUTANEOUS
  Administered 2020-03-23: 7 [IU] via SUBCUTANEOUS
  Administered 2020-03-23: 4 [IU] via SUBCUTANEOUS
  Administered 2020-03-23: 3 [IU] via SUBCUTANEOUS
  Administered 2020-03-23 (×2): 4 [IU] via SUBCUTANEOUS
  Administered 2020-03-24: 7 [IU] via SUBCUTANEOUS
  Administered 2020-03-24: 4 [IU] via SUBCUTANEOUS
  Administered 2020-03-24: 3 [IU] via SUBCUTANEOUS
  Administered 2020-03-25: 4 [IU] via SUBCUTANEOUS
  Administered 2020-03-25: 3 [IU] via SUBCUTANEOUS
  Administered 2020-03-25 (×2): 7 [IU] via SUBCUTANEOUS
  Administered 2020-03-25 – 2020-03-26 (×2): 4 [IU] via SUBCUTANEOUS
  Administered 2020-03-26 (×2): 3 [IU] via SUBCUTANEOUS
  Administered 2020-03-26 (×2): 4 [IU] via SUBCUTANEOUS
  Administered 2020-03-27: 3 [IU] via SUBCUTANEOUS
  Administered 2020-03-27 (×2): 4 [IU] via SUBCUTANEOUS
  Administered 2020-03-27 – 2020-03-28 (×2): 7 [IU] via SUBCUTANEOUS
  Administered 2020-03-28 (×3): 3 [IU] via SUBCUTANEOUS
  Administered 2020-03-28 – 2020-03-30 (×6): 4 [IU] via SUBCUTANEOUS
  Administered 2020-03-30: 20 [IU] via SUBCUTANEOUS
  Administered 2020-03-30: 11 [IU] via SUBCUTANEOUS
  Administered 2020-03-30: 20 [IU] via SUBCUTANEOUS
  Administered 2020-03-30: 4 [IU] via SUBCUTANEOUS
  Administered 2020-03-30: 3 [IU] via SUBCUTANEOUS
  Administered 2020-03-31: 7 [IU] via SUBCUTANEOUS
  Administered 2020-03-31: 4 [IU] via SUBCUTANEOUS
  Administered 2020-03-31: 11 [IU] via SUBCUTANEOUS
  Administered 2020-03-31 – 2020-04-01 (×2): 7 [IU] via SUBCUTANEOUS
  Administered 2020-04-01 (×2): 4 [IU] via SUBCUTANEOUS
  Administered 2020-04-01 – 2020-04-02 (×4): 7 [IU] via SUBCUTANEOUS
  Administered 2020-04-02: 11 [IU] via SUBCUTANEOUS
  Administered 2020-04-02: 3 [IU] via SUBCUTANEOUS
  Administered 2020-04-02: 11 [IU] via SUBCUTANEOUS
  Administered 2020-04-02: 3 [IU] via SUBCUTANEOUS
  Administered 2020-04-02: 7 [IU] via SUBCUTANEOUS
  Administered 2020-04-03: 3 [IU] via SUBCUTANEOUS
  Administered 2020-04-03: 4 [IU] via SUBCUTANEOUS
  Administered 2020-04-03: 3 [IU] via SUBCUTANEOUS
  Administered 2020-04-03: 7 [IU] via SUBCUTANEOUS
  Administered 2020-04-03 – 2020-04-04 (×2): 4 [IU] via SUBCUTANEOUS
  Administered 2020-04-04: 7 [IU] via SUBCUTANEOUS
  Administered 2020-04-04: 4 [IU] via SUBCUTANEOUS
  Administered 2020-04-04: 3 [IU] via SUBCUTANEOUS
  Administered 2020-04-04: 4 [IU] via SUBCUTANEOUS
  Administered 2020-04-05: 3 [IU] via SUBCUTANEOUS
  Administered 2020-04-05: 4 [IU] via SUBCUTANEOUS
  Administered 2020-04-05: 3 [IU] via SUBCUTANEOUS
  Administered 2020-04-05 – 2020-04-06 (×2): 4 [IU] via SUBCUTANEOUS
  Administered 2020-04-06: 3 [IU] via SUBCUTANEOUS
  Administered 2020-04-06: 7 [IU] via SUBCUTANEOUS
  Administered 2020-04-06 – 2020-04-07 (×3): 4 [IU] via SUBCUTANEOUS
  Administered 2020-04-07: 11 [IU] via SUBCUTANEOUS
  Administered 2020-04-07: 4 [IU] via SUBCUTANEOUS
  Administered 2020-04-07 (×3): 11 [IU] via SUBCUTANEOUS
  Administered 2020-04-08 (×2): 7 [IU] via SUBCUTANEOUS
  Administered 2020-04-08: 11 [IU] via SUBCUTANEOUS
  Administered 2020-04-08: 7 [IU] via SUBCUTANEOUS
  Administered 2020-04-08: 4 [IU] via SUBCUTANEOUS
  Administered 2020-04-08 – 2020-04-09 (×2): 7 [IU] via SUBCUTANEOUS
  Administered 2020-04-09: 4 [IU] via SUBCUTANEOUS
  Administered 2020-04-09: 3 [IU] via SUBCUTANEOUS
  Administered 2020-04-09 – 2020-04-10 (×3): 7 [IU] via SUBCUTANEOUS
  Administered 2020-04-10 (×3): 4 [IU] via SUBCUTANEOUS
  Administered 2020-04-10 – 2020-04-11 (×3): 7 [IU] via SUBCUTANEOUS
  Administered 2020-04-11 (×2): 4 [IU] via SUBCUTANEOUS
  Administered 2020-04-11: 7 [IU] via SUBCUTANEOUS
  Administered 2020-04-12 (×5): 4 [IU] via SUBCUTANEOUS
  Administered 2020-04-13: 3 [IU] via SUBCUTANEOUS
  Administered 2020-04-13: 7 [IU] via SUBCUTANEOUS
  Administered 2020-04-13 – 2020-04-14 (×4): 3 [IU] via SUBCUTANEOUS
  Administered 2020-04-14: 4 [IU] via SUBCUTANEOUS
  Administered 2020-04-15: 3 [IU] via SUBCUTANEOUS
  Administered 2020-04-16 – 2020-04-17 (×5): 4 [IU] via SUBCUTANEOUS
  Administered 2020-04-17: 7 [IU] via SUBCUTANEOUS
  Administered 2020-04-18 – 2020-04-21 (×7): 4 [IU] via SUBCUTANEOUS
  Administered 2020-04-22: 3 [IU] via SUBCUTANEOUS
  Administered 2020-04-22: 4 [IU] via SUBCUTANEOUS
  Administered 2020-04-22 – 2020-04-23 (×6): 3 [IU] via SUBCUTANEOUS
  Administered 2020-04-23 – 2020-04-24 (×2): 4 [IU] via SUBCUTANEOUS
  Administered 2020-04-24: 3 [IU] via SUBCUTANEOUS
  Administered 2020-04-25: 4 [IU] via SUBCUTANEOUS
  Administered 2020-04-25: 3 [IU] via SUBCUTANEOUS

## 2020-03-13 MED ORDER — HEPARIN SODIUM (PORCINE) 1000 UNIT/ML IJ SOLN
INTRAMUSCULAR | Status: AC
  Filled 2020-03-13: qty 4

## 2020-03-13 MED ORDER — DARBEPOETIN ALFA 150 MCG/0.3ML IJ SOSY
PREFILLED_SYRINGE | INTRAMUSCULAR | Status: AC
  Filled 2020-03-13: qty 0.3

## 2020-03-13 NOTE — Progress Notes (Signed)
NAME:  Wanda Ramirez, MRN:  [id number], DOB:  12-Jul-1970, LOS: 13 ADMISSION DATE:  02/13/2020, CONSULTATION DATE: 02/24/2020 REFERRING MD: Dr. Heber [place], CHIEF COMPLAINT: Increasing supplemental oxygen demand  Brief History   Shantell Belongia is a 49 year old female with a past medical history significant for end-stage renal disease on hemodialysis MWF, chronic hypoxic respiratory failure on 3 L nasal cannula at baseline, sleep apnea, diabetes, hypertension, hyperlipidemia, hypothyroidism, and asthma who presented to the emergency department with complaints of worsening shortness of breath, dry cough, generalized body aches, and loose stools.  Patient was diagnosed 9/9 with Covid pneumonia.  Initially she was able to maintain outpatient status but when symptoms worsened 9/14 she presented to the emergency department.  Patient reported dyspnea and weakness worsened to the point where she was unable to ambulate effectively.  She also reports she was unable to maintain adequate oral intake.  Patient received the moderna vaccines in March and April of this year.  Patient presented initially febrile with a temperature of 102.9 and mildly tachypneic with all other vital signs within normal limits.  She was initially managed on 4 L nasal cannula.  Lab work significant for NA 130, K3.8, glucose 454, anion gap 21, lactic acid 1.8, LDH 296, ferritin 6690, CRP 13.3, and procalcitonin 10.51  On review of medical record it appears patient has progressively required increased supplemental oxygen requirement.  Afternoon of 9/24 patient had episode of desaturation requiring further increase in supplemental oxygen.  On chart assessment it appears patient has been placed on 40 L with reported increased work of breathing prompting PCCM consultation  Past Medical History  Sleep apnea Morbid obesity Hypothyroidism Hypertension GERD  End-stage renal disease on HD MWF Diabetes Asthma   Immunization History   Administered Date(s) Administered  . Influenza,inj,Quad PF,6+ Mos 02/09/2018     Significant Hospital Events   Admitted 9/14  9/24 - Lying in bed on side currently undergoing iHD,  She is lethargic but will arouse to verbal stimuli but quickly falls back to sleep.  RN states patient has had progressive decline over the last 12hrs 9/25 - -currently in medical ICU to [facility].  Bed 12.  She has been on BiPAP with Precedex overnight.  This morning on the low-dose of Precedex she got agitated.?  Also had melena.  She also desaturated.  Nursing then increase her Precedex and since then she has been more stable.  Yesterday dialysis was cut short.  9/26 -unable to run CRRT with prone ventilation.  Resume supine ventilation but still unable to draw blood back. 9/27 transferred to 28m. IR replaced HD catheter 10/1 on CRRT and vasopressors 10/6 - extubated 10/7 - ad some stridor and increased WOB so placed on BIPAP and dexamethasone given.  Consults:  PCCM  Procedures:  9/23 IR guided tunneled HD cath placement - left subclavian ?  Date right radial art line ?  Date right IJ 10/6 extubated 10/ 8 -reintubated.  Febrile to 103.  Started on cultures and antibiotics.  10/9 -  on vent 40%. Fent gtt, Precedex gtt, On levophed gtt - coming to nearly off. Fever coming down. RN reporting discolored mucus from lung. Tolerated HD despite perssors  Significant Diagnostic Tests:  Pulmonary VQ scan at 9/21 > negative  Micro Data:   03/09/2020 -tracheal aspirate -abundant Staphylococcus -MSSA 03/09/2020-blood culture 0 negative as of 03/12/2020    Antimicrobials:  Cefepime 9/24 > Azithromycin 9/21 > 9/26 Remdesivir completed 9/17 > 9/19 Tocilizumab x1  xxx Vancomycin 03/09/2020-03/11/2020 Zosyn  03/09/2020-03/11/2020 Ceftriaxone 03/11/2020 >>  Interim history/subjective:   Improving oxygen requirements.  Slowly waking up.  Objective   Blood pressure 96/60, pulse (!) 115, temperature 98.8 F  (37.1 C), temperature source Axillary, resp. rate 20, height 5\' 2"  (1.575 m), weight 111.1 kg, SpO2 91 %.    Vent Mode: PRVC FiO2 (%):  [55 %-100 %] 55 % Set Rate:  [35 bmp] 35 bmp Vt Set:  [400 mL] 400 mL PEEP:  [8 cmH20] 8 cmH20 Plateau Pressure:  [25 cmH20-31 cmH20] 25 cmH20   Intake/Output Summary (Last 24 hours) at 03/13/2020 1817 Last data filed at 03/13/2020 1155 Gross per 24 hour  Intake 228.53 ml  Output 200 ml  Net 28.53 ml   Filed Weights   03/10/20 1038 03/12/20 0351 03/13/20 1507  Weight: 105.2 kg 108.2 kg 111.1 kg   General Appearance:  Looks criticall ill OBESE - + Head:  Normocephalic, without obvious abnormality, atraumatic Eyes:  PERRL - yes, conjunctiva/corneas - muddy     Ears:  Normal external ear canals, both ears Nose:  G tube - yes Throat:  ETT TUBE - YES , OG tube - no Neck:  Supple,  No enlargement/tenderness/nodules Lungs: Clear to auscultation bilaterally, Ventilator   Synchrony - yes, 40% fio2, peep 8.  Copious secretions. Heart:  S1 and S2 normal, no murmur, CVP - x.  Pressors - levophed on decreasing doses Abdomen:  Soft, no masses, no organomegaly Genitalia / Rectal:  Not done Extremities:  Extremities- intact Skin:  ntact in exposed areas . Sacral area - not examined Neurologic:  Sedation - gtt -> RASS - -4 . Moves all 4s - yes. CAM-ICU - NA . Orientation - not oriented   Resolved Hospital Problem list   Mixed metabolic/respiratory acidosis  Acute on chronic Anemia likely component of chronic disease with blood loss no obvious signs of bleeding s/p 2 u prbc on 9/28 - Stable, monitor  Shock, undifferentiated, improved Patient's echocardiogram shows RV overload.  Could be RV stunning from PVR increase due to COVID +/- septic effect of COVID, regardless, improved. She is off antibiotics - Resolved  Assessment & Plan:   Acute hypoxic/hypercapnic respiratory failure due to ARDS secondary to COVID 19 Pneumonia Patient has completed  steroid, remdesivir, immunomodulating therapy. Extubated 10/6 . Reintubated 03/09/20 - due to MSSA VAP -PRVC support -Ventilator associated pneumonia bundle -May need tracheostomy particularly given that major issue appears to be agitation.  -Start chest physiotherapy and mucolytic's  Circulatory shock Plan  - levophed for MAP > 65 currently weaning   MSSA VAP/HCAP  03/09/20 Plan - change to ceftriaxone and treat for 7 days   ESRD on HD  - s/p CRRT ending 03/08/20   iHD per renal  Thrombocytopenia, at risk VTE- - eliquis 2.5mg  BID ( as laid out 03/08/20 by DR Tamala Julian) x 1 month sinc discharge   Poorly controlled diabetes with hyperglycemia, A1c 12.8% on admission -Better controlled with SSI and long-acting   Best practice:  Diet: TF, swallow screen if weans from BIPAP Pain/Anxiety/Delirium protocol (if indicated): off VAP protocol (if indicated): off DVT prophylaxis: see above GI prophylaxis: PPI Glucose control: see above Mobility:  Bed rest Code Status: Full Family Communication:  Daughter Disa Riedlinger (the one RN says MD should commuicate with) -> updated 03/10/20 and 03/11/20. Expl;ained concept of secondary pneumonia. Explained need for trach/ltac Disposition:  ICU. Marland KitchenCan go to non-covid ICU. Needs Trach/LTAC    LABS    PULMONARY Recent Labs  Lab 03/07/20 1816  03/09/20 1510 03/09/20 2053 03/11/20 0211 03/13/20 1135  PHART 7.366 7.193* 7.346* 7.421 7.327*  PCO2ART 41.7 74.4* 49.1* 42.0 47.5  PO2ART 71* 142* 116* 108 122*  HCO3 24.0 27.7 26.6 27.2 24.7  TCO2 25 30 28 28 26   O2SAT 94.0 98.0 98.0 98.0 98.0    CBC Recent Labs  Lab 03/09/20 0521 03/09/20 1510 03/10/20 0352 03/10/20 1509 03/11/20 0211 03/12/20 0403 03/13/20 1135  HGB 7.5*   < > 6.4*   < > 8.5* 8.2* 7.8*  HCT 24.3*   < > 20.5*   < > 25.0* 25.1* 23.0*  WBC 11.2*  --  17.9*  --   --  23.1*  --   PLT 142*  --  190  --   --  232  --    < > = values in this interval not displayed.     COAGULATION No results for input(s): INR in the last 168 hours.  CARDIAC  No results for input(s): TROPONINI in the last 168 hours. No results for input(s): PROBNP in the last 168 hours.   CHEMISTRY Recent Labs  Lab 03/07/20 0458 03/07/20 0458 03/07/20 1547 03/07/20 1816 03/08/20 0432 03/09/20 0521 03/09/20 1510 03/10/20 0352 03/10/20 0352 03/11/20 0211 03/11/20 0211 03/11/20 0407 03/11/20 0407 03/12/20 0403 03/13/20 1135  NA 137   < > 137   < > 137  --    < > 141  --  138  --  137  --  138 135  K 4.0   < > 4.8   < > 4.5  --    < > 5.1   < > 3.8   < > 4.2   < > 5.1 6.4*  CL 104   < > 102  --  103  --   --  105  --   --   --  100  --  100  --   CO2 23   < > 23  --  22  --   --  23  --   --   --  25  --  24  --   GLUCOSE 117*   < > 179*  --  180*  --   --  263*  --   --   --  194*  --  328*  --   BUN 23*   < > 23*  --  25*  --   --  65*  --   --   --  49*  --  74*  --   CREATININE 2.02*   < > 1.92*  --  1.84*  --   --  4.96*  --   --   --  3.99*  --  6.04*  --   CALCIUM 8.7*   < > 8.7*  --  8.9  --   --  9.1  --   --   --  9.0  --  9.1  --   MG 2.7*  --   --   --  2.6* 2.7*  --  2.9*  --   --   --   --   --   --   --   PHOS 3.4   < > 4.5  --  2.6  --   --  5.6*  --   --   --  4.8*  --  5.7*  --    < > = values in this interval not displayed.   Estimated Creatinine Clearance:  13.3 mL/min (A) (by C-G formula based on SCr of 6.04 mg/dL (H)).   LIVER Recent Labs  Lab 03/07/20 1547 03/08/20 0432 03/10/20 0352 03/11/20 0407 03/12/20 0403  AST  --   --   --   --  19  ALT  --   --   --   --  37  ALKPHOS  --   --   --   --  281*  BILITOT  --   --   --   --  1.3*  PROT  --   --   --   --  5.7*  ALBUMIN 1.8* 1.8* 1.4* 1.2* 1.1*     INFECTIOUS Recent Labs  Lab 03/09/20 1534 03/10/20 0351 03/10/20 0352 03/11/20 0407  LATICACIDVEN  --  1.7  --   --   PROCALCITON 4.36  --  8.39 10.82     ENDOCRINE CBG (last 3)  Recent Labs    03/13/20 0745 03/13/20 1115  03/13/20 1608  GLUCAP 253* 275* 228*   CRITICAL CARE Performed by: Kipp Brood   Total critical care time: 40 minutes  Critical care time was exclusive of separately billable procedures and treating other patients.  Critical care was necessary to treat or prevent imminent or life-threatening deterioration.  Critical care was time spent personally by me on the following activities: development of treatment plan with patient and/or surrogate as well as nursing, discussions with consultants, evaluation of patient's response to treatment, examination of patient, obtaining history from patient or surrogate, ordering and performing treatments and interventions, ordering and review of laboratory studies, ordering and review of radiographic studies, pulse oximetry, re-evaluation of patient's condition and participation in multidisciplinary rounds.  Kipp Brood, MD [facility] ICU Physician [place]  Pager: [phone] Mobile: [phone] After hours: [phone].

## 2020-03-13 NOTE — Progress Notes (Signed)
[facility] ROUNDING NOTE   Subjective:   Brief history: This a 49 year old lady with a history of end-stage renal disease Monday Wednesday Friday dialysis chronic hypoxic respiratory failure, she is on 3 L nasal cannula at baseline.  She has a history of diabetes hypertension hyperlipidemia hypothyroidism.  She was diagnosed with Covid pneumonia 02/09/2020.  She was admitted 02/13/2020.  She required intubation and CRRT on vasopressors 03/02/2020 - 10/7.  Last hemodialysis 03/10/2020 with 2 L removed next dialysis will be 03/13/2020  Blood pressure 120/75 pulse 120 temperature 99.1 O2 sats 94% 6% FiO2   Sodium 138 potassium 5.1 chloride 100 CO2 24 BUN 74 creatinine 6 glucose 328 phosphorus 5.7 calcium 9.1 albumin 1.1 hemoglobin 8.2 WBC 23.1  Eliquis 2.5 mg twice daily, insulin sliding scale, Levemir 24 units every 12 hours, levothyroxine 125 mcg daily, Protonix 40 mg twice daily  IV norepinephrine IV Rocephin IV heparin       Objective:  Vital signs in last 24 hours:  Temp:  [98.7 F (37.1 C)-100.4 F (38 C)] 99.1 F (37.3 C) (10/12 0337) Pulse Rate:  [84-111] 101 (10/12 0630) Resp:  [4-35] 13 (10/12 0630) BP: (90-118)/(56-62) 90/62 (10/11 1624) SpO2:  [91 %-99 %] 99 % (10/12 0630) Arterial Line BP: (80-154)/(51-97) 106/66 (10/12 0630) FiO2 (%):  [40 %-100 %] 60 % (10/12 0325)  Weight change:  Filed Weights   03/10/20 0700 03/10/20 1038 03/12/20 0351  Weight: 107 kg 105.2 kg 108.2 kg    Intake/Output: I/O last 3 completed shifts: In: 1243.9 [I.V.:693.8; NG/GT:450; IV Piggyback:100.1] Out: -    Intake/Output this shift:  No intake/output data recorded.  Critically ill obese CVS- RRR without murmurs rubs or gallops RS- CTA ventilator dependent ETT tube ABD- BS present soft non-distended   EXT- no edema   Basic Metabolic Panel: Recent Labs  Lab 03/07/20 0458 03/07/20 0458 03/07/20 1547 03/07/20 1816 03/08/20 0432 03/08/20 0432 03/09/20 0521  03/09/20 1510 03/09/20 2053 03/10/20 0352 03/11/20 0211 03/11/20 0407 03/12/20 0403  NA 137   < > 137   < > 137  --   --    < > 138 141 138 137 138  K 4.0   < > 4.8   < > 4.5  --   --    < > 5.3* 5.1 3.8 4.2 5.1  CL 104   < > 102  --  103  --   --   --   --  105  --  100 100  CO2 23   < > 23  --  22  --   --   --   --  23  --  25 24  GLUCOSE 117*   < > 179*  --  180*  --   --   --   --  263*  --  194* 328*  BUN 23*   < > 23*  --  25*  --   --   --   --  65*  --  49* 74*  CREATININE 2.02*   < > 1.92*  --  1.84*  --   --   --   --  4.96*  --  3.99* 6.04*  CALCIUM 8.7*   < > 8.7*   < > 8.9   < >  --   --   --  9.1  --  9.0 9.1  MG 2.7*  --   --   --  2.6*  --  2.7*  --   --  2.9*  --   --   --   PHOS 3.4   < > 4.5  --  2.6  --   --   --   --  5.6*  --  4.8* 5.7*   < > = values in this interval not displayed.    Liver Function Tests: Recent Labs  Lab 03/07/20 1547 03/08/20 0432 03/10/20 0352 03/11/20 0407 03/12/20 0403  AST  --   --   --   --  19  ALT  --   --   --   --  37  ALKPHOS  --   --   --   --  281*  BILITOT  --   --   --   --  1.3*  PROT  --   --   --   --  5.7*  ALBUMIN 1.8* 1.8* 1.4* 1.2* 1.1*   No results for input(s): LIPASE, AMYLASE in the last 168 hours. No results for input(s): AMMONIA in the last 168 hours.  CBC: Recent Labs  Lab 03/07/20 0458 03/07/20 1816 03/08/20 0432 03/08/20 0432 03/09/20 0521 03/09/20 1510 03/09/20 2053 03/10/20 0352 03/10/20 1509 03/11/20 0211 03/12/20 0403  WBC 11.0*  --  13.6*  --  11.2*  --   --  17.9*  --   --  23.1*  HGB 7.6*   < > 7.6*   < > 7.5*   < > 7.1* 6.4* 8.2* 8.5* 8.2*  HCT 24.8*   < > 24.2*   < > 24.3*   < > 21.0* 20.5* 25.4* 25.0* 25.1*  MCV 100.0  --  98.4  --  98.0  --   --  98.1  --   --  92.6  PLT 81*  --  115*  --  142*  --   --  190  --   --  232   < > = values in this interval not displayed.    Cardiac Enzymes: No results for input(s): CKTOTAL, CKMB, CKMBINDEX, TROPONINI in the last 168  hours.  BNP: Invalid input(s): POCBNP  CBG: Recent Labs  Lab 03/12/20 1116 03/12/20 1520 03/12/20 1926 03/12/20 2329 03/13/20 0337  GLUCAP 210* 185* 216* 320* 6*    Microbiology: Results for orders placed or performed during the hospital encounter of 02/13/20  Culture, blood (Routine x 2)     Status: None   Collection Time: 02/13/20  7:02 PM   Specimen: BLOOD RIGHT HAND  Result Value Ref Range Status   Specimen Description BLOOD RIGHT HAND  Final   Special Requests   Final    BOTTLES DRAWN AEROBIC AND ANAEROBIC Blood Culture adequate volume   Culture   Final    NO GROWTH 5 DAYS Performed at [facility], [place] [address], [place], [place] [address]    Report Status 02/18/2020 FINAL  Final  SARS Coronavirus 2 by RT PCR (hospital order, performed in [facility] hospital lab) Nasopharyngeal Nasopharyngeal Swab     Status: Abnormal   Collection Time: 02/14/20  8:33 AM   Specimen: Nasopharyngeal Swab  Result Value Ref Range Status   SARS Coronavirus 2 POSITIVE (A) NEGATIVE Final    Comment: RESULT CALLED TO, READ BACK BY AND VERIFIED WITH: RN T.SHORE AT 1110 ON 02/14/2020 BY T.SAAD (NOTE) SARS-CoV-2 target nucleic acids are DETECTED  SARS-CoV-2 RNA is generally detectable in upper respiratory specimens  during the acute phase of infection.  Positive results are indicative  of the presence of the  identified virus, but do not rule out bacterial infection or co-infection with other pathogens not detected by the test.  Clinical correlation with patient history and  other diagnostic information is necessary to determine patient infection status.  The expected result is negative.  Fact Sheet for Patients:   StrictlyIdeas.no   Fact Sheet for Healthcare Providers:   BankingDealers.co.za    This test is not yet approved or cleared by the [place] FDA and  has been authorized for detection and/or diagnosis of SARS-CoV-2  by FDA under an Emergency Use Authorization (EUA).  This EUA will remain in effect (meaning  this test can be used) for the duration of  the COVID-19 declaration under Section 564(b)(1) of the Act, 21 U.S.C. section 360-bbb-3(b)(1), unless the authorization is terminated or revoked sooner.  Performed at [facility], [place] [address], [place], [place] [address]   Culture, blood (Routine x 2)     Status: None   Collection Time: 02/14/20 10:39 AM   Specimen: BLOOD RIGHT FOREARM  Result Value Ref Range Status   Specimen Description BLOOD RIGHT FOREARM  Final   Special Requests   Final    BOTTLES DRAWN AEROBIC AND ANAEROBIC Blood Culture adequate volume   Culture   Final    NO GROWTH 5 DAYS Performed at [facility], [place] [address], [place], [place] [address]    Report Status 02/19/2020 FINAL  Final  Culture, blood (Routine X 2) w Reflex to ID Panel     Status: None   Collection Time: 02/26/20  4:25 PM   Specimen: BLOOD RIGHT HAND  Result Value Ref Range Status   Specimen Description BLOOD RIGHT HAND  Final   Special Requests   Final    BOTTLES DRAWN AEROBIC AND ANAEROBIC Blood Culture adequate volume   Culture   Final    NO GROWTH 5 DAYS Performed at [facility], [place] [address] [place], [place] [address]    Report Status 03/02/2020 FINAL  Final  Culture, blood (Routine X 2) w Reflex to ID Panel     Status: None   Collection Time: 02/26/20  4:31 PM   Specimen: BLOOD RIGHT ARM  Result Value Ref Range Status   Specimen Description BLOOD RIGHT ARM  Final   Special Requests   Final    BOTTLES DRAWN AEROBIC ONLY Blood Culture results may not be optimal due to an inadequate volume of blood received in culture bottles   Culture   Final    NO GROWTH 5 DAYS Performed at [facility], [place] [address] [place], [place] [address]    Report Status 03/02/2020 FINAL  Final  MRSA PCR Screening     Status: None   Collection Time: 02/27/20  9:14 AM   Specimen:  Nasopharyngeal  Result Value Ref Range Status   MRSA by PCR NEGATIVE NEGATIVE Final    Comment:        The GeneXpert MRSA Assay (FDA approved for NASAL specimens only), is one component of a comprehensive MRSA colonization surveillance program. It is not intended to diagnose MRSA infection nor to guide or monitor treatment for MRSA infections. Performed at [facility], [place] [address] [place],  [address]   Culture, respiratory (non-expectorated)     Status: None   Collection Time: 03/09/20  3:01 PM   Specimen: Tracheal Aspirate; Respiratory  Result Value Ref Range Status   Specimen Description TRACHEAL ASPIRATE  Final   Special Requests  NONE  Final   Gram Stain   Final    ABUNDANT WBC PRESENT,BOTH PMN AND MONONUCLEAR ABUNDANT GRAM POSITIVE COCCI ABUNDANT GRAM NEGATIVE RODS FEW GRAM VARIABLE ROD Performed at [facility], [place] [address], [place], [place] [address]    Culture ABUNDANT STAPHYLOCOCCUS AUREUS  Final   Report Status 03/11/2020 FINAL  Final   Organism ID, Bacteria STAPHYLOCOCCUS AUREUS  Final      Susceptibility   Staphylococcus aureus - MIC*    CIPROFLOXACIN <=0.5 SENSITIVE Sensitive     ERYTHROMYCIN >=8 RESISTANT Resistant     GENTAMICIN <=0.5 SENSITIVE Sensitive     OXACILLIN <=0.25 SENSITIVE Sensitive     TETRACYCLINE <=1 SENSITIVE Sensitive     VANCOMYCIN <=0.5 SENSITIVE Sensitive     TRIMETH/SULFA <=10 SENSITIVE Sensitive     CLINDAMYCIN RESISTANT Resistant     RIFAMPIN <=0.5 SENSITIVE Sensitive     Inducible Clindamycin POSITIVE Resistant     * ABUNDANT STAPHYLOCOCCUS AUREUS  Culture, blood (Routine X 2) w Reflex to ID Panel     Status: None (Preliminary result)   Collection Time: 03/09/20  3:15 PM   Specimen: BLOOD RIGHT HAND  Result Value Ref Range Status   Specimen Description BLOOD RIGHT HAND  Final   Special Requests   Final    BOTTLES DRAWN AEROBIC AND ANAEROBIC Blood Culture adequate volume   Culture   Final    NO GROWTH  4 DAYS Performed at [facility], [address], [place], [place] [address]    Report Status PENDING  Incomplete  Culture, blood (Routine X 2) w Reflex to ID Panel     Status: None (Preliminary result)   Collection Time: 03/09/20  3:30 PM   Specimen: BLOOD RIGHT HAND  Result Value Ref Range Status   Specimen Description BLOOD RIGHT HAND  Final   Special Requests   Final    BOTTLES DRAWN AEROBIC AND ANAEROBIC Blood Culture adequate volume   Culture   Final    NO GROWTH 4 DAYS Performed at [facility], [place] [address], [place], [place] [address]    Report Status PENDING  Incomplete    Coagulation Studies: No results for input(s): LABPROT, INR in the last 72 hours.  Urinalysis: No results for input(s): COLORURINE, LABSPEC, PHURINE, GLUCOSEU, HGBUR, BILIRUBINUR, KETONESUR, PROTEINUR, UROBILINOGEN, NITRITE, LEUKOCYTESUR in the last 72 hours.  Invalid input(s): APPERANCEUR    Imaging: DG CHEST PORT 1 VIEW  Result Date: 03/12/2020 CLINICAL DATA:  COVID positive. EXAM: PORTABLE CHEST 1 VIEW COMPARISON:  03/11/2020 FINDINGS: 0426 hours. Endotracheal tube tip is 2.7 cm above the base of the carina. A feeding tube passes into the stomach although the distal tip position is not included on the film. Left IJ dialysis catheter tip overlies the upper right atrium. Right IJ central line tip overlies the innominate vein confluence. Apparent new left subclavian catheter/sheath. Diffuse bilateral airspace disease, right greater than left, similar to prior. No pneumothorax or pleural effusion evident on the current exam. Telemetry leads overlie the chest. IMPRESSION: Stable exam. Right greater than left diffuse airspace disease without substantial change. Support apparatus as above. Electronically Signed   By: Misty Stanley M.D.   On: 03/12/2020 06:31     Medications:   . sodium chloride Stopped (03/11/20 1509)  . sodium chloride    . cefTRIAXone (ROCEPHIN)  IV Stopped (03/12/20 1201)   . dextrose    . feeding supplement (VITAL 1.5 CAL) 1,000 mL (03/12/20 1116)  .  fentaNYL infusion INTRAVENOUS 300 mcg/hr (03/13/20 0700)  . midazolam (VERSED) infusion    . norepinephrine (LEVOPHED) Adult infusion 5 mcg/min (03/13/20 0700)   . sodium chloride   Intravenous Once  . apixaban  2.5 mg Per Tube BID  . B-complex with vitamin C  1 tablet Per Tube Daily  . chlorhexidine gluconate (MEDLINE KIT)  15 mL Mouth Rinse BID  . Chlorhexidine Gluconate Cloth  6 each Topical Q0600  . Chlorhexidine Gluconate Cloth  6 each Topical Q0600  . darbepoetin (ARANESP) injection - DIALYSIS  150 mcg Intravenous Q Tue-HD  . docusate  100 mg Per Tube BID  . feeding supplement (PROSource TF)  45 mL Per Tube TID  . insulin aspart  0-15 Units Subcutaneous Q4H  . insulin aspart  10 Units Subcutaneous Q4H  . insulin detemir  40 Units Subcutaneous Q12H  . levothyroxine  125 mcg Per Tube Q0600  . mouth rinse  15 mL Mouth Rinse 10 times per day  . pantoprazole sodium  40 mg Per Tube BID  . polyethylene glycol  17 g Per Tube BID  . sodium chloride flush  10-40 mL Intracatheter Q12H  . sodium chloride flush  3 mL Intravenous Q12H  . sodium chloride HYPERTONIC  4 mL Nebulization BID  . vancomycin variable dose per unstable renal function (pharmacist dosing)   Does not apply See admin instructions   sodium chloride, sodium chloride, acetaminophen (TYLENOL) oral liquid 160 mg/5 mL, albuterol, dextrose, dextrose, fentaNYL, fentaNYL (SUBLIMAZE) injection, heparin, influenza vac split quadrivalent PF, lip balm, midazolam, sodium chloride flush, sodium chloride flush   OP HD: TTS  4h 100mn 450/800  2/2.25 bath  111.5kg  Hep 2000  L AVF  - darbe 50 ug q week, last 9/7  - calc 1.0 tiw  - 9/9 Hb 10.0, tsat 22%  Assessment/ Plan:  # COVID pna w/ acute on chronic hypoxic respiratory failure:  dexamethasone, status post tocilizumab and remdesivir.  Severe CXR changes.    # Acute hypoxic resp failure  - extubated  on 10/6 > reintubated on 10/8  #  Staph aureus VAP/ HCAP 03/09/20 - restarted on IV abx Rocephin  # ESRD:usual HD is TTS.  - sp CRRT 9/28- 10/7.  Next dialysis will be scheduled for 03/13/2020   # Clotted LUA AVF: note TDC placed by IR 9/11. Will need new perm access at some point when pt is more stable. Tunneled catheter exchanged on 9/27 with IR.   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 Pt back on vent. Ca and phos in range.   #DM2 - management per primary team    LOS: 2[place]'@TODAY' '@7' :25 AM

## 2020-03-13 NOTE — Progress Notes (Signed)
Inpatient Diabetes Program Recommendations  AACE/ADA: New Consensus Statement on Inpatient Glycemic Control (2015)  Target Ranges:  Prepandial:   less than 140 mg/dL      Peak postprandial:   less than 180 mg/dL (1-2 hours)      Critically ill patients:  140 - 180 mg/dL   Lab Results  Component Value Date   GLUCAP 275 (H) 03/13/2020   HGBA1C 12.8 (H) 02/14/2020    Review of Glycemic Control Results for CONCHITA, TRUXILLO (MRN [id number]) as of 03/13/2020 11:56  Ref. Range 03/12/2020 23:29 03/13/2020 03:37 03/13/2020 07:45 03/13/2020 11:15  Glucose-Capillary Latest Ref Range: 70 - 99 mg/dL 320 (H) 272 (H) 253 (H) 275 (H)  Diabetes history: Type 2 DM Outpatient Diabetes medications: Amaryl 4 mg QAM, Victoza 1.8 mg QD,  Current orders for Inpatient glycemic control: Levemir 40 units BID, Novolog 10 units Q4H, Novolog 0-20 units Q4H Vital 1.5 cal @ 50 ml/hr Inpatient Diabetes Program Recommendations:    Consider further increasing Levemir to 48 units BID and adding Tradjenta 5 mg QD.   Thanks, Bronson Curb, MSN, RNC-OB Diabetes Coordinator [phone] (8a-5p)

## 2020-03-14 DIAGNOSIS — J9601 Acute respiratory failure with hypoxia: Secondary | ICD-10-CM | POA: Diagnosis not present

## 2020-03-14 LAB — CULTURE, BLOOD (ROUTINE X 2)
Culture: NO GROWTH
Culture: NO GROWTH
Special Requests: ADEQUATE
Special Requests: ADEQUATE

## 2020-03-14 LAB — GLUCOSE, CAPILLARY
Glucose-Capillary: 221 mg/dL — ABNORMAL HIGH (ref 70–99)
Glucose-Capillary: 152 mg/dL — ABNORMAL HIGH (ref 70–99)
Glucose-Capillary: 132 mg/dL — ABNORMAL HIGH (ref 70–99)
Glucose-Capillary: 114 mg/dL — ABNORMAL HIGH (ref 70–99)
Glucose-Capillary: 156 mg/dL — ABNORMAL HIGH (ref 70–99)

## 2020-03-14 LAB — HEPATITIS B E ANTIBODY: Hep B E Ab: NEGATIVE

## 2020-03-14 MED ORDER — INSULIN ASPART 100 UNIT/ML ~~LOC~~ SOLN
12.0000 [IU] | SUBCUTANEOUS | Status: DC
  Administered 2020-03-14 – 2020-03-18 (×13): 12 [IU] via SUBCUTANEOUS

## 2020-03-14 MED ORDER — CHLORHEXIDINE GLUCONATE CLOTH 2 % EX PADS
6.0000 | MEDICATED_PAD | Freq: Every day | CUTANEOUS | Status: DC
  Administered 2020-03-15 – 2020-03-16 (×2): 6 via TOPICAL

## 2020-03-14 NOTE — Progress Notes (Signed)
[facility] ROUNDING NOTE   Subjective:   Brief history: This a 49 year old lady with a history of end-stage renal disease Monday Wednesday Friday dialysis chronic hypoxic respiratory failure, she is on 3 L nasal cannula at baseline.  She has a history of diabetes hypertension hyperlipidemia hypothyroidism.  She was diagnosed with Covid pneumonia 02/09/2020.  She was admitted 02/13/2020.  She required intubation and CRRT on vasopressors 03/02/2020 - 10/7.  Last hemodialysis 03/13/2020 with removal of 2 L.  Next dialysis will be 03/15/2020  Blood pressure 108/77 pulse 113 temperature 100.4 O2 sats 93% FiO2 50%   Sodium 138 potassium 5.1 chloride 100 CO2 24 BUN 74 creatinine 6 glucose 328 phosphorus 5.7 calcium 9.1 albumin 1.1 hemoglobin 8.2 WBC 23.1  Eliquis 2.5 mg twice daily, insulin sliding scale, Levemir 24 units every 12 hours, levothyroxine 125 mcg daily, Protonix 40 mg twice daily  IV norepinephrine IV Rocephin IV heparin       Objective:  Vital signs in last 24 hours:  Temp:  [98.4 F (36.9 C)-100.7 F (38.2 C)] 100.4 F (38 C) (10/13 0350) Pulse Rate:  [96-124] 113 (10/13 0500) Resp:  [16-37] 32 (10/13 0500) BP: (92-129)/(60-74) 112/74 (10/13 0500) SpO2:  [90 %-99 %] 92 % (10/13 0500) Arterial Line BP: (101-108)/(60-96) 101/96 (10/12 1100) FiO2 (%):  [50 %-55 %] 50 % (10/13 0500) Weight:  [109.2 kg-111.1 kg] 109.7 kg (10/13 0453)  Weight change:  Filed Weights   03/13/20 1507 03/13/20 1907 03/14/20 0453  Weight: 111.1 kg 109.2 kg 109.7 kg    Intake/Output: I/O last 3 completed shifts: In: 1396.6 [I.V.:591.7; NG/GT:605; IV Piggyback:199.9] Out: 200 [Stool:200]   Intake/Output this shift:  Total I/O In: 149.1 [I.V.:149.1] Out: 2000 [Other:2000]  Critically ill obese CVS- RRR without murmurs rubs or gallops RS- CTA ventilator dependent ETT tube ABD- BS present soft non-distended   EXT- no edema   Basic Metabolic Panel: Recent Labs  Lab   0000 03/07/20 1547 03/07/20 1816 03/08/20 0432 03/09/20 0521 03/09/20 1510 03/10/20 0352 03/11/20 0211 03/11/20 0407 03/12/20 0403 03/13/20 1135  NA  --  137   < > 137  --    < > 141 138 137 138 135  K  --  4.8   < > 4.5  --    < > 5.1 3.8 4.2 5.1 6.4*  CL  --  102  --  103  --   --  105  --  100 100  --   CO2  --  23  --  22  --   --  23  --  25 24  --   GLUCOSE  --  179*  --  180*  --   --  263*  --  194* 328*  --   BUN  --  23*  --  25*  --   --  65*  --  49* 74*  --   CREATININE  --  1.92*  --  1.84*  --   --  4.96*  --  3.99* 6.04*  --   CALCIUM   < > 8.7*   < > 8.9  --   --  9.1  --  9.0 9.1  --   MG  --   --   --  2.6* 2.7*  --  2.9*  --   --   --   --   PHOS  --  4.5  --  2.6  --   --  5.6*  --  4.8* 5.7*  --    < > = values in this interval not displayed.    Liver Function Tests: Recent Labs  Lab 03/07/20 1547 03/08/20 0432 03/10/20 0352 03/11/20 0407 03/12/20 0403  AST  --   --   --   --  19  ALT  --   --   --   --  37  ALKPHOS  --   --   --   --  281*  BILITOT  --   --   --   --  1.3*  PROT  --   --   --   --  5.7*  ALBUMIN 1.8* 1.8* 1.4* 1.2* 1.1*   No results for input(s): LIPASE, AMYLASE in the last 168 hours. No results for input(s): AMMONIA in the last 168 hours.  CBC: Recent Labs  Lab 03/08/20 0432 03/08/20 0432 03/09/20 0521 03/09/20 1510 03/10/20 0352 03/10/20 1509 03/11/20 0211 03/12/20 0403 03/13/20 1135  WBC 13.6*  --  11.2*  --  17.9*  --   --  23.1*  --   HGB 7.6*   < > 7.5*   < > 6.4* 8.2* 8.5* 8.2* 7.8*  HCT 24.2*   < > 24.3*   < > 20.5* 25.4* 25.0* 25.1* 23.0*  MCV 98.4  --  98.0  --  98.1  --   --  92.6  --   PLT 115*  --  142*  --  190  --   --  232  --    < > = values in this interval not displayed.    Cardiac Enzymes: No results for input(s): CKTOTAL, CKMB, CKMBINDEX, TROPONINI in the last 168 hours.  BNP: Invalid input(s): POCBNP  CBG: Recent Labs  Lab 03/13/20 1115 03/13/20 1608 03/13/20 1926 03/13/20 2334  03/14/20 0345  GLUCAP 275* 228* 219* 35* 27*    Microbiology: Results for orders placed or performed during the hospital encounter of 02/13/20  Culture, blood (Routine x 2)     Status: None   Collection Time: 02/13/20  7:02 PM   Specimen: BLOOD RIGHT HAND  Result Value Ref Range Status   Specimen Description BLOOD RIGHT HAND  Final   Special Requests   Final    BOTTLES DRAWN AEROBIC AND ANAEROBIC Blood Culture adequate volume   Culture   Final    NO GROWTH 5 DAYS Performed at [facility], [place] [address], [place], [place] [address]    Report Status 02/18/2020 FINAL  Final  SARS Coronavirus 2 by RT PCR (hospital order, performed in [facility] hospital lab) Nasopharyngeal Nasopharyngeal Swab     Status: Abnormal   Collection Time: 02/14/20  8:33 AM   Specimen: Nasopharyngeal Swab  Result Value Ref Range Status   SARS Coronavirus 2 POSITIVE (A) NEGATIVE Final    Comment: RESULT CALLED TO, READ BACK BY AND VERIFIED WITH: RN T.SHORE AT 1110 ON 02/14/2020 BY T.SAAD (NOTE) SARS-CoV-2 target nucleic acids are DETECTED  SARS-CoV-2 RNA is generally detectable in upper respiratory specimens  during the acute phase of infection.  Positive results are indicative  of the presence of the identified virus, but do not rule out bacterial infection or co-infection with other pathogens not detected by the test.  Clinical correlation with patient history and  other diagnostic information is necessary to determine patient infection status.  The expected result is negative.  Fact Sheet for Patients:   StrictlyIdeas.no   Fact Sheet for Healthcare Providers:   BankingDealers.co.za  This test is not yet approved or cleared by the [place] and  has been authorized for detection and/or diagnosis of SARS-CoV-2 by FDA under an Emergency Use Authorization (EUA).  This EUA will remain in effect (meaning  this test can be used) for the  duration of  the COVID-19 declaration under Section 564(b)(1) of the Act, 21 U.S.C. section 360-bbb-3(b)(1), unless the authorization is terminated or revoked sooner.  Performed at [facility], [place] [address], [place], [place] [address]   Culture, blood (Routine x 2)     Status: None   Collection Time: 02/14/20 10:39 AM   Specimen: BLOOD RIGHT FOREARM  Result Value Ref Range Status   Specimen Description BLOOD RIGHT FOREARM  Final   Special Requests   Final    BOTTLES DRAWN AEROBIC AND ANAEROBIC Blood Culture adequate volume   Culture   Final    NO GROWTH 5 DAYS Performed at [facility], [place] [address] [place], [place] [address]    Report Status 02/19/2020 FINAL  Final  Culture, blood (Routine X 2) w Reflex to ID Panel     Status: None   Collection Time: 02/26/20  4:25 PM   Specimen: BLOOD RIGHT HAND  Result Value Ref Range Status   Specimen Description BLOOD RIGHT HAND  Final   Special Requests   Final    BOTTLES DRAWN AEROBIC AND ANAEROBIC Blood Culture adequate volume   Culture   Final    NO GROWTH 5 DAYS Performed at [facility], [place] [address], [place], [place] [address]    Report Status 03/02/2020 FINAL  Final  Culture, blood (Routine X 2) w Reflex to ID Panel     Status: None   Collection Time: 02/26/20  4:31 PM   Specimen: BLOOD RIGHT ARM  Result Value Ref Range Status   Specimen Description BLOOD RIGHT ARM  Final   Special Requests   Final    BOTTLES DRAWN AEROBIC ONLY Blood Culture results may not be optimal due to an inadequate volume of blood received in culture bottles   Culture   Final    NO GROWTH 5 DAYS Performed at [facility], [place] [address] [place], [place] [address]    Report Status 03/02/2020 FINAL  Final  MRSA PCR Screening     Status: None   Collection Time: 02/27/20  9:14 AM   Specimen: Nasopharyngeal  Result Value Ref Range Status   MRSA by PCR NEGATIVE NEGATIVE Final    Comment:        The GeneXpert MRSA Assay  (FDA approved for NASAL specimens only), is one component of a comprehensive MRSA colonization surveillance program. It is not intended to diagnose MRSA infection nor to guide or monitor treatment for MRSA infections. Performed at [facility], [place] [address], [place], [place] [address]   Culture, respiratory (non-expectorated)     Status: None   Collection Time: 03/09/20  3:01 PM   Specimen: Tracheal Aspirate; Respiratory  Result Value Ref Range Status   Specimen Description TRACHEAL ASPIRATE  Final   Special Requests NONE  Final   Gram Stain   Final    ABUNDANT WBC PRESENT,BOTH PMN AND MONONUCLEAR ABUNDANT GRAM POSITIVE COCCI ABUNDANT GRAM NEGATIVE RODS FEW GRAM VARIABLE ROD Performed at [facility], [place] [address], [place], [place] [address]    Culture ABUNDANT STAPHYLOCOCCUS AUREUS  Final   Report Status 03/11/2020 FINAL  Final   Organism  ID, Bacteria STAPHYLOCOCCUS AUREUS  Final      Susceptibility   Staphylococcus aureus - MIC*    CIPROFLOXACIN <=0.5 SENSITIVE Sensitive     ERYTHROMYCIN >=8 RESISTANT Resistant     GENTAMICIN <=0.5 SENSITIVE Sensitive     OXACILLIN <=0.25 SENSITIVE Sensitive     TETRACYCLINE <=1 SENSITIVE Sensitive     VANCOMYCIN <=0.5 SENSITIVE Sensitive     TRIMETH/SULFA <=10 SENSITIVE Sensitive     CLINDAMYCIN RESISTANT Resistant     RIFAMPIN <=0.5 SENSITIVE Sensitive     Inducible Clindamycin POSITIVE Resistant     * ABUNDANT STAPHYLOCOCCUS AUREUS  Culture, blood (Routine X 2) w Reflex to ID Panel     Status: None   Collection Time: 03/09/20  3:15 PM   Specimen: BLOOD RIGHT HAND  Result Value Ref Range Status   Specimen Description BLOOD RIGHT HAND  Final   Special Requests   Final    BOTTLES DRAWN AEROBIC AND ANAEROBIC Blood Culture adequate volume   Culture   Final    NO GROWTH 5 DAYS Performed at [facility], [address], [place], [place] [address]    Report Status 03/14/2020 FINAL  Final  Culture, blood (Routine  X 2) w Reflex to ID Panel     Status: None   Collection Time: 03/09/20  3:30 PM   Specimen: BLOOD RIGHT HAND  Result Value Ref Range Status   Specimen Description BLOOD RIGHT HAND  Final   Special Requests   Final    BOTTLES DRAWN AEROBIC AND ANAEROBIC Blood Culture adequate volume   Culture   Final    NO GROWTH 5 DAYS Performed at [facility], [place] [address] [place], [place] [address]    Report Status 03/14/2020 FINAL  Final    Coagulation Studies: No results for input(s): LABPROT, INR in the last 72 hours.  Urinalysis: No results for input(s): COLORURINE, LABSPEC, PHURINE, GLUCOSEU, HGBUR, BILIRUBINUR, KETONESUR, PROTEINUR, UROBILINOGEN, NITRITE, LEUKOCYTESUR in the last 72 hours.  Invalid input(s): APPERANCEUR    Imaging: No results found.   Medications:   . sodium chloride Stopped (03/11/20 1509)  . sodium chloride    . cefTRIAXone (ROCEPHIN)  IV Stopped (03/13/20 1220)  . dextrose    . feeding supplement (VITAL 1.5 CAL) 1,000 mL (03/13/20 2336)  . fentaNYL infusion INTRAVENOUS 350 mcg/hr (03/14/20 0500)  . midazolam (VERSED) infusion 7 mg/hr (03/14/20 0500)  . norepinephrine (LEVOPHED) Adult infusion Stopped (03/13/20 1501)   . sodium chloride   Intravenous Once  . apixaban  2.5 mg Per Tube BID  . B-complex with vitamin C  1 tablet Per Tube Daily  . chlorhexidine gluconate (MEDLINE KIT)  15 mL Mouth Rinse BID  . Chlorhexidine Gluconate Cloth  6 each Topical Q0600  . Chlorhexidine Gluconate Cloth  6 each Topical Q0600  . darbepoetin (ARANESP) injection - DIALYSIS  150 mcg Intravenous Q Tue-HD  . docusate  100 mg Per Tube BID  . feeding supplement (PROSource TF)  45 mL Per Tube TID  . fiber  1 packet Per Tube BID  . insulin aspart  0-20 Units Subcutaneous Q4H  . insulin aspart  10 Units Subcutaneous Q4H  . insulin detemir  40 Units Subcutaneous Q12H  . levothyroxine  125 mcg Per Tube Q0600  . mouth rinse  15 mL Mouth Rinse 10 times per day  .  pantoprazole sodium  40 mg Per Tube BID  . polyethylene glycol  17 g Per Tube BID  .  sodium chloride flush  10-40 mL Intracatheter Q12H  . sodium chloride flush  3 mL Intravenous Q12H  . sodium chloride HYPERTONIC  4 mL Nebulization BID  . vancomycin variable dose per unstable renal function (pharmacist dosing)   Does not apply See admin instructions   sodium chloride, sodium chloride, acetaminophen (TYLENOL) oral liquid 160 mg/5 mL, albuterol, dextrose, dextrose, fentaNYL, fentaNYL (SUBLIMAZE) injection, heparin, influenza vac split quadrivalent PF, lip balm, midazolam, sodium chloride flush, sodium chloride flush   OP HD: TTS  4h 71mn 450/800  2/2.25 bath  111.5kg  Hep 2000  L AVF  - darbe 50 ug q week, last 9/7  - calc 1.0 tiw  - 9/9 Hb 10.0, tsat 22%  Assessment/ Plan:  # COVID pna w/ acute on chronic hypoxic respiratory failure:  dexamethasone, status post tocilizumab and remdesivir.  Severe CXR changes.    # Acute hypoxic resp failure  - extubated on 10/6 > reintubated on 10/8  #  Staph aureus VAP/ HCAP 03/09/20 - restarted on IV abx Rocephin  # ESRD:usual HD is TTS.  - sp CRRT 9/28- 10/7.  Next dialysis will be scheduled for 03/15/2020   # Clotted LUA AVF: note TDC placed by IR 9/11. Will need new perm access at some point when pt is more stable. Tunneled catheter exchanged on 9/27 with IR.   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 Pt back on vent. Ca and phos in range.   #DM2 - management per primary team    LOS: 2[place]'@TODAY' '@6' :52 AM

## 2020-03-14 NOTE — Progress Notes (Signed)
NAME:  Wanda Ramirez, MRN:  [id number], DOB:  08/16/1970, LOS: 34 ADMISSION DATE:  02/13/2020, CONSULTATION DATE: 02/24/2020 REFERRING MD: Dr. Heber Shepherd, CHIEF COMPLAINT: Increasing supplemental oxygen demand  Brief History   Wanda Ramirez is a 49 year old female with a past medical history significant for end-stage renal disease on hemodialysis MWF, chronic hypoxic respiratory failure on 3 L nasal cannula at baseline, sleep apnea, diabetes, hypertension, hyperlipidemia, hypothyroidism, and asthma who presented to the emergency department with complaints of worsening shortness of breath, dry cough, generalized body aches, and loose stools.  Patient was diagnosed 9/9 with Covid pneumonia.  Initially she was able to maintain outpatient status but when symptoms worsened 9/14 she presented to the emergency department.  Patient reported dyspnea and weakness worsened to the point where she was unable to ambulate effectively.  She also reports she was unable to maintain adequate oral intake.  Patient received the moderna vaccines in March and April of this year.  Patient presented initially febrile with a temperature of 102.9 and mildly tachypneic with all other vital signs within normal limits.  She was initially managed on 4 L nasal cannula.  Lab work significant for NA 130, K3.8, glucose 454, anion gap 21, lactic acid 1.8, LDH 296, ferritin 6690, CRP 13.3, and procalcitonin 10.51  On review of medical record it appears patient has progressively required increased supplemental oxygen requirement.  Afternoon of 9/24 patient had episode of desaturation requiring further increase in supplemental oxygen.  On chart assessment it appears patient has been placed on 40 L with reported increased work of breathing prompting PCCM consultation  Past Medical History  Sleep apnea Morbid obesity Hypothyroidism Hypertension GERD  End-stage renal disease on HD MWF Diabetes Asthma   Immunization History   Administered Date(s) Administered  . Influenza,inj,Quad PF,6+ Mos 02/09/2018     Significant Hospital Events   Admitted 9/14  9/24 - Lying in bed on side currently undergoing iHD,  She is lethargic but will arouse to verbal stimuli but quickly falls back to sleep.  RN states patient has had progressive decline over the last 12hrs 9/25 - -currently in medical ICU to [facility].  Bed 12.  She has been on BiPAP with Precedex overnight.  This morning on the low-dose of Precedex she got agitated.?  Also had melena.  She also desaturated.  Nursing then increase her Precedex and since then she has been more stable.  Yesterday dialysis was cut short.  9/26 -unable to run CRRT with prone ventilation.  Resume supine ventilation but still unable to draw blood back. 9/27 transferred to 67m. IR replaced HD catheter 10/1 on CRRT and vasopressors 10/6 - extubated 10/7 - ad some stridor and increased WOB so placed on BIPAP and dexamethasone given.  Consults:  PCCM  Procedures:  9/23 IR guided tunneled HD cath placement - left subclavian ?  Date right radial art line ?  Date right IJ 10/6 extubated 10/ 8 -reintubated.  Febrile to 103.  Started on cultures and antibiotics.  10/9 -  on vent 40%. Fent gtt, Precedex gtt, On levophed gtt - coming to nearly off. Fever coming down. RN reporting discolored mucus from lung. Tolerated HD despite perssors  Significant Diagnostic Tests:  Pulmonary VQ scan at 9/21 > negative  Micro Data:   03/09/2020 -tracheal aspirate -abundant Staphylococcus -MSSA 03/09/2020-blood culture 0 negative as of 03/12/2020    Antimicrobials:  Cefepime 9/24 > Azithromycin 9/21 > 9/26 Remdesivir completed 9/17 > 9/19 Tocilizumab x1  xxx Vancomycin 03/09/2020-03/11/2020 Zosyn  03/09/2020-03/11/2020 Ceftriaxone 03/11/2020 >>  Interim history/subjective:   Improving oxygen requirements.  Slowly waking up.  Objective   Blood pressure 113/73, pulse (!) 115, temperature (!)  101.2 F (38.4 C), temperature source Axillary, resp. rate 18, height 5\' 2"  (1.575 m), weight 109.7 kg, SpO2 95 %.    Vent Mode: PRVC FiO2 (%):  [50 %-55 %] 50 % Set Rate:  [35 bmp] 35 bmp Vt Set:  [400 mL] 400 mL PEEP:  [8 cmH20] 8 cmH20 Plateau Pressure:  [25 cmH20-32 cmH20] 32 cmH20   Intake/Output Summary (Last 24 hours) at 03/14/2020 1022 Last data filed at 03/14/2020 0900 Gross per 24 hour  Intake 874.58 ml  Output 2200 ml  Net -1325.42 ml   Filed Weights   03/13/20 1507 03/13/20 1907 03/14/20 0453  Weight: 111.1 kg 109.2 kg 109.7 kg   General Appearance:  Looks criticall ill OBESE - + Head:  Normocephalic, without obvious abnormality, atraumatic Eyes:  PERRL - yes, conjunctiva/corneas - muddy     Ears:  Normal external ear canals, both ears Nose:  G tube - yes Throat:  ETT TUBE - YES , OG tube - no Neck:  Supple,  No enlargement/tenderness/nodules Lungs: Clear to auscultation bilaterally, Ventilator   Synchrony - yes, 40% fio2, peep 8.  Copious secretions. Heart:  S1 and S2 normal, no murmur, CVP - x.  Pressors - levophed on decreasing doses Abdomen:  Soft, no masses, no organomegaly Genitalia / Rectal:  Not done Extremities:  Extremities- intact Skin:  ntact in exposed areas . Sacral area - not examined Neurologic:  Sedation - gtt -> RASS - -4 . Moves all 4s - yes. CAM-ICU - NA . Orientation - not oriented   Resolved Hospital Problem list   Mixed metabolic/respiratory acidosis  Acute on chronic Anemia likely component of chronic disease with blood loss no obvious signs of bleeding s/p 2 u prbc on 9/28 - Stable, monitor  Shock, undifferentiated, improved Patient's echocardiogram shows RV overload.  Could be RV stunning from PVR increase due to COVID +/- septic effect of COVID, regardless, improved. She is off antibiotics - Resolved  Assessment & Plan:   Acute hypoxic/hypercapnic respiratory failure due to ARDS secondary to COVID 19 Pneumonia Patient has  completed steroid, remdesivir, immunomodulating therapy. Extubated 10/6 . Reintubated 03/09/20 - due to MSSA VAP -PRVC support -Ventilator associated pneumonia bundle -May need tracheostomy particularly given that major issue appears to be agitation.  -Start chest physiotherapy and mucolytic's  Circulatory shock  - now off vasopressors.   MSSA VAP/HCAP  03/09/20 Plan - change to ceftriaxone and treat for 7 days  ESRD on HD  - s/p CRRT ending 03/08/20   iHD per renal  Thrombocytopenia, at risk VTE- - eliquis 2.5mg  BID ( as laid out 03/08/20 by DR Tamala Julian) x 1 month sinc discharge  Poorly controlled diabetes with hyperglycemia, A1c 12.8% on admission -Better controlled with SSI and long-acting  Best practice:  Diet: TF, swallow screen if weans from BIPAP Pain/Anxiety/Delirium protocol (if indicated): off VAP protocol (if indicated): off DVT prophylaxis: see above GI prophylaxis: PPI Glucose control: see above Mobility:  Bed rest Code Status: Full Family Communication:  Daughter Rosalee [place] (the one RN says MD should commuicate with) -> updated 10/13, reviewed progress so far and possible need for tracheostomy.  Disposition:  ICU. Can go to non-covid ICU. Needs Trach/LTAC  LABS    PULMONARY Recent Labs  Lab 03/07/20 1816 03/09/20 1510 03/09/20 2053 03/11/20 0211 03/13/20 1135  PHART  7.366 7.193* 7.346* 7.421 7.327*  PCO2ART 41.7 74.4* 49.1* 42.0 47.5  PO2ART 71* 142* 116* 108 122*  HCO3 24.0 27.7 26.6 27.2 24.7  TCO2 25 30 28 28 26   O2SAT 94.0 98.0 98.0 98.0 98.0    CBC Recent Labs  Lab 03/09/20 0521 03/09/20 1510 03/10/20 0352 03/10/20 1509 03/11/20 0211 03/12/20 0403 03/13/20 1135  HGB 7.5*   < > 6.4*   < > 8.5* 8.2* 7.8*  HCT 24.3*   < > 20.5*   < > 25.0* 25.1* 23.0*  WBC 11.2*  --  17.9*  --   --  23.1*  --   PLT 142*  --  190  --   --  232  --    < > = values in this interval not displayed.    COAGULATION No results for input(s): INR in the last  168 hours.  CARDIAC  No results for input(s): TROPONINI in the last 168 hours. No results for input(s): PROBNP in the last 168 hours.   CHEMISTRY Recent Labs  Lab 03/07/20 1547 03/07/20 1816 03/08/20 0432 03/09/20 0521 03/09/20 1510 03/10/20 0352 03/10/20 0352 03/11/20 0211 03/11/20 0211 03/11/20 0407 03/11/20 0407 03/12/20 0403 03/13/20 1135  NA 137   < > 137  --    < > 141  --  138  --  137  --  138 135  K 4.8   < > 4.5  --    < > 5.1   < > 3.8   < > 4.2   < > 5.1 6.4*  CL 102  --  103  --   --  105  --   --   --  100  --  100  --   CO2 23  --  22  --   --  23  --   --   --  25  --  24  --   GLUCOSE 179*  --  180*  --   --  263*  --   --   --  194*  --  328*  --   BUN 23*  --  25*  --   --  65*  --   --   --  49*  --  74*  --   CREATININE 1.92*  --  1.84*  --   --  4.96*  --   --   --  3.99*  --  6.04*  --   CALCIUM 8.7*  --  8.9  --   --  9.1  --   --   --  9.0  --  9.1  --   MG  --   --  2.6* 2.7*  --  2.9*  --   --   --   --   --   --   --   PHOS 4.5  --  2.6  --   --  5.6*  --   --   --  4.8*  --  5.7*  --    < > = values in this interval not displayed.   Estimated Creatinine Clearance: 13.1 mL/min (A) (by C-G formula based on SCr of 6.04 mg/dL (H)).   LIVER Recent Labs  Lab 03/07/20 1547 03/08/20 0432 03/10/20 0352 03/11/20 0407 03/12/20 0403  AST  --   --   --   --  19  ALT  --   --   --   --  37  ALKPHOS  --   --   --   --  281*  BILITOT  --   --   --   --  1.3*  PROT  --   --   --   --  5.7*  ALBUMIN 1.8* 1.8* 1.4* 1.2* 1.1*     INFECTIOUS Recent Labs  Lab 03/09/20 1534 03/10/20 0351 03/10/20 0352 03/11/20 0407  LATICACIDVEN  --  1.7  --   --   PROCALCITON 4.36  --  8.39 10.82     ENDOCRINE CBG (last 3)  Recent Labs    03/13/20 2334 03/14/20 0345 03/14/20 0748  GLUCAP 257* 221* 152*   CRITICAL CARE Performed by: Kipp Brood   Total critical care time: 35 minutes  Critical care time was exclusive of separately billable  procedures and treating other patients.  Critical care was necessary to treat or prevent imminent or life-threatening deterioration.  Critical care was time spent personally by me on the following activities: development of treatment plan with patient and/or surrogate as well as nursing, discussions with consultants, evaluation of patient's response to treatment, examination of patient, obtaining history from patient or surrogate, ordering and performing treatments and interventions, ordering and review of laboratory studies, ordering and review of radiographic studies, pulse oximetry, re-evaluation of patient's condition and participation in multidisciplinary rounds.  Kipp Brood, MD [facility] ICU Physician [place]  Pager: [phone] Mobile: [phone] After hours: [phone].

## 2020-03-14 NOTE — Progress Notes (Signed)
Assisted tele visit to patient with family member.  Nadie Fiumara D Berline Semrad, RN   

## 2020-03-15 DIAGNOSIS — U071 COVID-19: Secondary | ICD-10-CM | POA: Diagnosis not present

## 2020-03-15 DIAGNOSIS — J9601 Acute respiratory failure with hypoxia: Secondary | ICD-10-CM | POA: Diagnosis not present

## 2020-03-15 LAB — GLUCOSE, CAPILLARY
Glucose-Capillary: 116 mg/dL — ABNORMAL HIGH (ref 70–99)
Glucose-Capillary: 116 mg/dL — ABNORMAL HIGH (ref 70–99)
Glucose-Capillary: 134 mg/dL — ABNORMAL HIGH (ref 70–99)
Glucose-Capillary: 174 mg/dL — ABNORMAL HIGH (ref 70–99)
Glucose-Capillary: 174 mg/dL — ABNORMAL HIGH (ref 70–99)
Glucose-Capillary: 219 mg/dL — ABNORMAL HIGH (ref 70–99)
Glucose-Capillary: 71 mg/dL (ref 70–99)

## 2020-03-15 LAB — CBC
HCT: 22.7 % — ABNORMAL LOW (ref 36.0–46.0)
Hemoglobin: 7.2 g/dL — ABNORMAL LOW (ref 12.0–15.0)
MCH: 29.8 pg (ref 26.0–34.0)
MCHC: 31.7 g/dL (ref 30.0–36.0)
MCV: 93.8 fL (ref 80.0–100.0)
Platelets: 348 10*3/uL (ref 150–400)
RBC: 2.42 MIL/uL — ABNORMAL LOW (ref 3.87–5.11)
RDW: 19.4 % — ABNORMAL HIGH (ref 11.5–15.5)
WBC: 19 10*3/uL — ABNORMAL HIGH (ref 4.0–10.5)
nRBC: 0 % (ref 0.0–0.2)

## 2020-03-15 LAB — RENAL FUNCTION PANEL
Albumin: 1.1 g/dL — ABNORMAL LOW (ref 3.5–5.0)
Anion gap: 17 — ABNORMAL HIGH (ref 5–15)
BUN: 79 mg/dL — ABNORMAL HIGH (ref 6–20)
CO2: 25 mmol/L (ref 22–32)
Calcium: 9.7 mg/dL (ref 8.9–10.3)
Chloride: 97 mmol/L — ABNORMAL LOW (ref 98–111)
Creatinine, Ser: 6.6 mg/dL — ABNORMAL HIGH (ref 0.44–1.00)
GFR, Estimated: 7 mL/min — ABNORMAL LOW (ref >60)
Glucose, Bld: 113 mg/dL — ABNORMAL HIGH (ref 70–99)
Phosphorus: 7.1 mg/dL — ABNORMAL HIGH (ref 2.5–4.6)
Potassium: 5.9 mmol/L — ABNORMAL HIGH (ref 3.5–5.1)
Sodium: 139 mmol/L (ref 135–145)

## 2020-03-15 MED ORDER — SODIUM CHLORIDE 0.9 % IV SOLN: 100.0000 mL | INTRAVENOUS | Status: DC | PRN

## 2020-03-15 MED ORDER — NEPRO/CARBSTEADY PO LIQD
1000.0000 mL | ORAL | Status: DC
  Administered 2020-03-15 – 2020-04-02 (×16): 1000 mL
  Filled 2020-03-15 (×33): qty 1000

## 2020-03-15 MED ORDER — ALTEPLASE 2 MG IJ SOLR
2.0000 mg | Freq: Once | INTRAMUSCULAR | Status: DC | PRN
  Filled 2020-03-15: qty 2

## 2020-03-15 MED ORDER — HEPARIN SODIUM (PORCINE) 1000 UNIT/ML DIALYSIS
1000.0000 [IU] | INTRAMUSCULAR | Status: DC | PRN
  Administered 2020-03-27: 1000 [IU] via INTRAVENOUS_CENTRAL
  Filled 2020-03-15 (×3): qty 1

## 2020-03-15 MED ORDER — LIDOCAINE-PRILOCAINE 2.5-2.5 % EX CREA: 1.0000 "application " | TOPICAL_CREAM | CUTANEOUS | Status: DC | PRN

## 2020-03-15 MED ORDER — PROSOURCE TF PO LIQD
45.0000 mL | Freq: Two times a day (BID) | ORAL | Status: DC
  Administered 2020-03-16 – 2020-04-04 (×39): 45 mL
  Filled 2020-03-15 (×39): qty 45

## 2020-03-15 MED ORDER — HEPARIN SODIUM (PORCINE) 1000 UNIT/ML IJ SOLN
INTRAMUSCULAR | Status: AC
  Administered 2020-03-15: 4200 [IU] via INTRAVENOUS_CENTRAL
  Filled 2020-03-15: qty 5

## 2020-03-15 MED ORDER — DEXMEDETOMIDINE HCL IN NACL 400 MCG/100ML IV SOLN
0.0000 ug/kg/h | INTRAVENOUS | Status: AC
  Administered 2020-03-15 (×2): 0.4 ug/kg/h via INTRAVENOUS
  Administered 2020-03-16 (×2): 0.8 ug/kg/h via INTRAVENOUS
  Administered 2020-03-16: 0.4 ug/kg/h via INTRAVENOUS
  Administered 2020-03-16 – 2020-03-17 (×2): 0.8 ug/kg/h via INTRAVENOUS
  Administered 2020-03-17: 1 ug/kg/h via INTRAVENOUS
  Administered 2020-03-17 (×3): 0.8 ug/kg/h via INTRAVENOUS
  Administered 2020-03-18 (×3): 0.9 ug/kg/h via INTRAVENOUS
  Filled 2020-03-15 (×8): qty 100
  Filled 2020-03-15: qty 200
  Filled 2020-03-15 (×5): qty 100

## 2020-03-15 MED ORDER — LIDOCAINE HCL (PF) 1 % IJ SOLN: 5.0000 mL | INTRAMUSCULAR | Status: DC | PRN

## 2020-03-15 MED ORDER — PENTAFLUOROPROP-TETRAFLUOROETH EX AERO: 1.0000 "application " | INHALATION_SPRAY | CUTANEOUS | Status: DC | PRN

## 2020-03-15 MED ORDER — DEXMEDETOMIDINE BOLUS VIA INFUSION: 0.2000 ug/kg | Freq: Once | INTRAVENOUS | Status: DC

## 2020-03-15 NOTE — Progress Notes (Signed)
[facility] ROUNDING NOTE   Subjective:   Brief history: This a 49 year old lady with a history of end-stage renal disease Monday Wednesday Friday dialysis chronic hypoxic respiratory failure, she is on 3 L nasal cannula at baseline.  She has a history of diabetes hypertension hyperlipidemia hypothyroidism.  She was diagnosed with Covid pneumonia 02/09/2020.  She was admitted 02/13/2020.  She required intubation and CRRT on vasopressors 03/02/2020 - 10/7.  Last hemodialysis 03/13/2020 with removal of 2 L.  Next dialysis will be 03/15/2020  Blood pressure 136/81 pulse 101 temperature 99.3 O2 sats 90% FiO2 50%   Sodium 138 potassium 5.1 chloride 100 CO2 24 BUN 74 creatinine 6 glucose 328 phosphorus 5.7 calcium 9.1 albumin 1.1 hemoglobin 8.2 WBC 23.1  Eliquis 2.5 mg twice daily, insulin sliding scale, Levemir 24 units every 12 hours, levothyroxine 125 mcg daily, Protonix 40 mg twice daily  IV norepinephrine IV Rocephin IV heparin       Objective:  Vital signs in last 24 hours:  Temp:  [97.7 F (36.5 C)-101.2 F (38.4 C)] 99.3 F (37.4 C) (10/14 0651) Pulse Rate:  [88-118] 101 (10/14 0736) Resp:  [0-37] 36 (10/14 0736) BP: (104-151)/(62-115) 136/81 (10/14 0730) SpO2:  [94 %-100 %] 98 % (10/14 0736) FiO2 (%):  [50 %] 50 % (10/14 0736) Weight:  [112.5 kg] 112.5 kg (10/14 0351)  Weight change: 1.4 kg Filed Weights   03/13/20 1907 03/14/20 0453 03/15/20 0351  Weight: 109.2 kg 109.7 kg 112.5 kg    Intake/Output: I/O last 3 completed shifts: In: 532.9 [I.V.:432.9; IV Piggyback:100] Out: 2000 [Other:2000]   Intake/Output this shift:  No intake/output data recorded.  Critically ill obese CVS- RRR without murmurs rubs or gallops RS- CTA ventilator dependent ETT tube ABD- BS present soft non-distended   EXT- no edema   Basic Metabolic Panel: Recent Labs  Lab 03/09/20 0521 03/09/20 1510 03/10/20 0352 03/11/20 0211 03/11/20 0407 03/12/20 0403 03/13/20 1135   NA  --    < > 141 138 137 138 135  K  --    < > 5.1 3.8 4.2 5.1 6.4*  CL  --   --  105  --  100 100  --   CO2  --   --  23  --  25 24  --   GLUCOSE  --   --  263*  --  194* 328*  --   BUN  --   --  65*  --  49* 74*  --   CREATININE  --   --  4.96*  --  3.99* 6.04*  --   CALCIUM  --   --  9.1  --  9.0 9.1  --   MG 2.7*  --  2.9*  --   --   --   --   PHOS  --   --  5.6*  --  4.8* 5.7*  --    < > = values in this interval not displayed.    Liver Function Tests: Recent Labs  Lab 03/10/20 0352 03/11/20 0407 03/12/20 0403  AST  --   --  19  ALT  --   --  37  ALKPHOS  --   --  281*  BILITOT  --   --  1.3*  PROT  --   --  5.7*  ALBUMIN 1.4* 1.2* 1.1*   No results for input(s): LIPASE, AMYLASE in the last 168 hours. No results for input(s): AMMONIA in the last 168 hours.  CBC: Recent Labs  Lab 03/09/20 0521 03/09/20 1510 03/10/20 0352 03/10/20 0352 03/10/20 1509 03/11/20 0211 03/12/20 0403 03/13/20 1135 03/15/20 0717  WBC 11.2*  --  17.9*  --   --   --  23.1*  --  19.0*  HGB 7.5*   < > 6.4*   < > 8.2* 8.5* 8.2* 7.8* 7.2*  HCT 24.3*   < > 20.5*   < > 25.4* 25.0* 25.1* 23.0* 22.7*  MCV 98.0  --  98.1  --   --   --  92.6  --  93.8  PLT 142*  --  190  --   --   --  232  --  348   < > = values in this interval not displayed.    Cardiac Enzymes: No results for input(s): CKTOTAL, CKMB, CKMBINDEX, TROPONINI in the last 168 hours.  BNP: Invalid input(s): POCBNP  CBG: Recent Labs  Lab 03/14/20 1205 03/14/20 1526 03/14/20 1918 03/15/20 0047 03/15/20 0338  GLUCAP 132* 114* 156* 174* 134*    Microbiology: Results for orders placed or performed during the hospital encounter of 02/13/20  Culture, blood (Routine x 2)     Status: None   Collection Time: 02/13/20  7:02 PM   Specimen: BLOOD RIGHT HAND  Result Value Ref Range Status   Specimen Description BLOOD RIGHT HAND  Final   Special Requests   Final    BOTTLES DRAWN AEROBIC AND ANAEROBIC Blood Culture adequate  volume   Culture   Final    NO GROWTH 5 DAYS Performed at [facility], [place] [address] [place], [place] [address]    Report Status 02/18/2020 FINAL  Final  SARS Coronavirus 2 by RT PCR (hospital order, performed in [facility] hospital lab) Nasopharyngeal Nasopharyngeal Swab     Status: Abnormal   Collection Time: 02/14/20  8:33 AM   Specimen: Nasopharyngeal Swab  Result Value Ref Range Status   SARS Coronavirus 2 POSITIVE (A) NEGATIVE Final    Comment: RESULT CALLED TO, READ BACK BY AND VERIFIED WITH: RN T.SHORE AT 1110 ON 02/14/2020 BY T.SAAD (NOTE) SARS-CoV-2 target nucleic acids are DETECTED  SARS-CoV-2 RNA is generally detectable in upper respiratory specimens  during the acute phase of infection.  Positive results are indicative  of the presence of the identified virus, but do not rule out bacterial infection or co-infection with other pathogens not detected by the test.  Clinical correlation with patient history and  other diagnostic information is necessary to determine patient infection status.  The expected result is negative.  Fact Sheet for Patients:   StrictlyIdeas.no   Fact Sheet for Healthcare Providers:   BankingDealers.co.za    This test is not yet approved or cleared by the [place] FDA and  has been authorized for detection and/or diagnosis of SARS-CoV-2 by FDA under an Emergency Use Authorization (EUA).  This EUA will remain in effect (meaning  this test can be used) for the duration of  the COVID-19 declaration under Section 564(b)(1) of the Act, 21 U.S.C. section 360-bbb-3(b)(1), unless the authorization is terminated or revoked sooner.  Performed at [facility], [place] [address] [place], [place] [address]   Culture, blood (Routine x 2)     Status: None   Collection Time: 02/14/20 10:39 AM   Specimen: BLOOD RIGHT FOREARM  Result Value Ref Range Status   Specimen Description BLOOD RIGHT  FOREARM  Final   Special Requests   Final    BOTTLES  DRAWN AEROBIC AND ANAEROBIC Blood Culture adequate volume   Culture   Final    NO GROWTH 5 DAYS Performed at [facility], [place] [address], [place], [place] [address]    Report Status 02/19/2020 FINAL  Final  Culture, blood (Routine X 2) w Reflex to ID Panel     Status: None   Collection Time: 02/26/20  4:25 PM   Specimen: BLOOD RIGHT HAND  Result Value Ref Range Status   Specimen Description BLOOD RIGHT HAND  Final   Special Requests   Final    BOTTLES DRAWN AEROBIC AND ANAEROBIC Blood Culture adequate volume   Culture   Final    NO GROWTH 5 DAYS Performed at [facility], [place] [address] [place], [place] [address]    Report Status 03/02/2020 FINAL  Final  Culture, blood (Routine X 2) w Reflex to ID Panel     Status: None   Collection Time: 02/26/20  4:31 PM   Specimen: BLOOD RIGHT ARM  Result Value Ref Range Status   Specimen Description BLOOD RIGHT ARM  Final   Special Requests   Final    BOTTLES DRAWN AEROBIC ONLY Blood Culture results may not be optimal due to an inadequate volume of blood received in culture bottles   Culture   Final    NO GROWTH 5 DAYS Performed at [facility], [place] [address], [place], [place] [address]    Report Status 03/02/2020 FINAL  Final  MRSA PCR Screening     Status: None   Collection Time: 02/27/20  9:14 AM   Specimen: Nasopharyngeal  Result Value Ref Range Status   MRSA by PCR NEGATIVE NEGATIVE Final    Comment:        The GeneXpert MRSA Assay (FDA approved for NASAL specimens only), is one component of a comprehensive MRSA colonization surveillance program. It is not intended to diagnose MRSA infection nor to guide or monitor treatment for MRSA infections. Performed at [facility], [place] [address], [place], [place] [address]   Culture, respiratory (non-expectorated)     Status: None   Collection Time: 03/09/20  3:01 PM   Specimen: Tracheal Aspirate;  Respiratory  Result Value Ref Range Status   Specimen Description TRACHEAL ASPIRATE  Final   Special Requests NONE  Final   Gram Stain   Final    ABUNDANT WBC PRESENT,BOTH PMN AND MONONUCLEAR ABUNDANT GRAM POSITIVE COCCI ABUNDANT GRAM NEGATIVE RODS FEW GRAM VARIABLE ROD Performed at [facility], [place] [address], [place], [place] [address]    Culture ABUNDANT STAPHYLOCOCCUS AUREUS  Final   Report Status 03/11/2020 FINAL  Final   Organism ID, Bacteria STAPHYLOCOCCUS AUREUS  Final      Susceptibility   Staphylococcus aureus - MIC*    CIPROFLOXACIN <=0.5 SENSITIVE Sensitive     ERYTHROMYCIN >=8 RESISTANT Resistant     GENTAMICIN <=0.5 SENSITIVE Sensitive     OXACILLIN <=0.25 SENSITIVE Sensitive     TETRACYCLINE <=1 SENSITIVE Sensitive     VANCOMYCIN <=0.5 SENSITIVE Sensitive     TRIMETH/SULFA <=10 SENSITIVE Sensitive     CLINDAMYCIN RESISTANT Resistant     RIFAMPIN <=0.5 SENSITIVE Sensitive     Inducible Clindamycin POSITIVE Resistant     * ABUNDANT STAPHYLOCOCCUS AUREUS  Culture, blood (Routine X 2) w Reflex to ID Panel     Status: None   Collection Time: 03/09/20  3:15 PM   Specimen: BLOOD RIGHT HAND  Result Value Ref  Range Status   Specimen Description BLOOD RIGHT HAND  Final   Special Requests   Final    BOTTLES DRAWN AEROBIC AND ANAEROBIC Blood Culture adequate volume   Culture   Final    NO GROWTH 5 DAYS Performed at [facility], [address] [place], [place] [address]    Report Status 03/14/2020 FINAL  Final  Culture, blood (Routine X 2) w Reflex to ID Panel     Status: None   Collection Time: 03/09/20  3:30 PM   Specimen: BLOOD RIGHT HAND  Result Value Ref Range Status   Specimen Description BLOOD RIGHT HAND  Final   Special Requests   Final    BOTTLES DRAWN AEROBIC AND ANAEROBIC Blood Culture adequate volume   Culture   Final    NO GROWTH 5 DAYS Performed at [facility], [place] [address], [place], [place] [address]    Report Status  03/14/2020 FINAL  Final    Coagulation Studies: No results for input(s): LABPROT, INR in the last 72 hours.  Urinalysis: No results for input(s): COLORURINE, LABSPEC, PHURINE, GLUCOSEU, HGBUR, BILIRUBINUR, KETONESUR, PROTEINUR, UROBILINOGEN, NITRITE, LEUKOCYTESUR in the last 72 hours.  Invalid input(s): APPERANCEUR    Imaging: No results found.   Medications:    sodium chloride Stopped (03/11/20 1509)   sodium chloride     sodium chloride     sodium chloride     cefTRIAXone (ROCEPHIN)  IV Stopped (03/14/20 1259)   dextrose     feeding supplement (VITAL 1.5 CAL) 1,000 mL (03/13/20 2336)   fentaNYL infusion INTRAVENOUS 250 mcg/hr (03/15/20 0600)   midazolam (VERSED) infusion 6 mg/hr (03/15/20 0622)    sodium chloride   Intravenous Once   apixaban  2.5 mg Per Tube BID   B-complex with vitamin C  1 tablet Per Tube Daily   chlorhexidine gluconate (MEDLINE KIT)  15 mL Mouth Rinse BID   Chlorhexidine Gluconate Cloth  6 each Topical Q0600   Chlorhexidine Gluconate Cloth  6 each Topical Q0600   Chlorhexidine Gluconate Cloth  6 each Topical Q0600   darbepoetin (ARANESP) injection - DIALYSIS  150 mcg Intravenous Q Tue-HD   docusate  100 mg Per Tube BID   feeding supplement (PROSource TF)  45 mL Per Tube TID   fiber  1 packet Per Tube BID   heparin sodium (porcine)       insulin aspart  0-20 Units Subcutaneous Q4H   insulin aspart  12 Units Subcutaneous Q4H   insulin detemir  40 Units Subcutaneous Q12H   levothyroxine  125 mcg Per Tube Q0600   mouth rinse  15 mL Mouth Rinse 10 times per day   pantoprazole sodium  40 mg Per Tube BID   sodium chloride flush  10-40 mL Intracatheter Q12H   sodium chloride flush  3 mL Intravenous Q12H   sodium chloride, sodium chloride, sodium chloride, sodium chloride, acetaminophen (TYLENOL) oral liquid 160 mg/5 mL, albuterol, alteplase, dextrose, dextrose, fentaNYL, fentaNYL (SUBLIMAZE) injection, heparin, heparin,  influenza vac split quadrivalent PF, lidocaine (PF), lidocaine-prilocaine, lip balm, midazolam, pentafluoroprop-tetrafluoroeth, sodium chloride flush, sodium chloride flush   OP HD: TTS  4h 87mn 450/800  2/2.25 bath  111.5kg  Hep 2000  L AVF  - darbe 50 ug q week, last 9/7  - calc 1.0 tiw  - 9/9 Hb 10.0, tsat 22%  Assessment/ Plan:  # COVID pna w/ acute on chronic hypoxic respiratory failure:  dexamethasone, status post tocilizumab and remdesivir.  Severe  CXR changes.    # Acute hypoxic resp failure  - extubated on 10/6 > reintubated on 10/8  #  Staph aureus VAP/ HCAP 03/09/20 - restarted on IV abx Rocephin  # ESRD:usual HD is TTS.  - sp CRRT 9/28- 10/7.  Next dialysis will be scheduled for 03/15/2020   # Clotted LUA AVF: note TDC placed by IR 9/11. Will need new perm access at some point when pt is more stable. Tunneled catheter exchanged on 9/27 with IR.   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 Pt back on vent. Ca and phos in range.   #DM2 - management per primary team    LOS: [place] _0 _1 :45 AM

## 2020-03-15 NOTE — Progress Notes (Signed)
   03/15/20 1129  Vitals  Temp 98.8 F (37.1 C)  Temp Source Oral  BP 134/76  MAP (mmHg) 93  BP Location Left Leg  BP Method Automatic  Patient Position (if appropriate) Lying  Pulse Rate (!) 112  Pulse Rate Source Monitor  ECG Heart Rate (!) 112  Resp (!) 31  Oxygen Therapy  SpO2 96 %  O2 Device Ventilator  Dialysis Weight  Weight 106.4 kg  Type of Weight Post-Dialysis  Post-Hemodialysis Assessment  Rinseback Volume (mL) 250 mL  KECN 276 V  Dialyzer Clearance Lightly streaked  Duration of HD Treatment -hour(s) 4 hour(s)  Hemodialysis Intake (mL) 500 mL  UF Total -Machine (mL) 3500 mL  Net UF (mL) 3000 mL  Tolerated HD Treatment Yes  Post-Hemodialysis Comments tx complete-pt stable  Hemodialysis Catheter Left Internal jugular Double lumen Permanent (Tunneled)  Placement Date/Time: 02/27/20 1844   Time Out: Correct patient;Correct site;Correct procedure  Maximum sterile barrier precautions: Hand hygiene;Cap;Mask;Sterile gown;Large sterile sheet  Site Prep: Chlorhexidine (preferred)  Local Anesthetic: Injecta...  Site Condition No complications  Blue Lumen Status Flushed;Capped (Central line);Heparin locked  Red Lumen Status Flushed;Capped (Central line);Heparin locked  Catheter fill solution Heparin 1000 units/ml  Catheter fill volume (Arterial) 2.1 cc  Catheter fill volume (Venous) 2.1  Dressing Type Gauze/Drain sponge  Dressing Status Clean;Dry;Intact;Antimicrobial disc in place  Dressing Change Due 03/20/20  Post treatment catheter status Capped and Clamped  HD tx complete, pt stable. UF=3 liters net.

## 2020-03-15 NOTE — Progress Notes (Signed)
Assisted tele visit to patient with family member.  Janziel Hockett P, RN  

## 2020-03-15 NOTE — Progress Notes (Signed)
Assisted tele visit to patient with family member.  Ioan Landini D Sharrell Krawiec, RN   

## 2020-03-15 NOTE — Progress Notes (Signed)
NAME:  Wanda Ramirez, MRN:  [id number], DOB:  Jun 01, 1971, LOS: 33 ADMISSION DATE:  02/13/2020, CONSULTATION DATE: 02/24/2020 REFERRING MD: Dr. Heber [place], CHIEF COMPLAINT: Increasing supplemental oxygen demand  Brief History   Wanda Ramirez is a 49 year old female with a past medical history significant for end-stage renal disease on hemodialysis MWF, chronic hypoxic respiratory failure on 3 L nasal cannula at baseline, sleep apnea, diabetes, hypertension, hyperlipidemia, hypothyroidism, and asthma who presented to the emergency department with complaints of worsening shortness of breath, dry cough, generalized body aches, and loose stools.  Patient was diagnosed 9/9 with Covid pneumonia.  Initially she was able to maintain outpatient status but when symptoms worsened 9/14 she presented to the emergency department.  Patient reported dyspnea and weakness worsened to the point where she was unable to ambulate effectively.  She also reports she was unable to maintain adequate oral intake.  Patient received the moderna vaccines in March and April of this year.  Patient presented initially febrile with a temperature of 102.9 and mildly tachypneic with all other vital signs within normal limits.  She was initially managed on 4 L nasal cannula.  Lab work significant for NA 130, K3.8, glucose 454, anion gap 21, lactic acid 1.8, LDH 296, ferritin 6690, CRP 13.3, and procalcitonin 10.51  On review of medical record it appears patient has progressively required increased supplemental oxygen requirement.  Afternoon of 9/24 patient had episode of desaturation requiring further increase in supplemental oxygen.  On chart assessment it appears patient has been placed on 40 L with reported increased work of breathing prompting PCCM consultation  Past Medical History  Sleep apnea Morbid obesity Hypothyroidism Hypertension GERD  End-stage renal disease on HD MWF Diabetes Asthma   Immunization History   Administered Date(s) Administered  . Influenza,inj,Quad PF,6+ Mos 02/09/2018     Significant Hospital Events   Admitted 9/14  9/24 - Lying in bed on side currently undergoing iHD,  She is lethargic but will arouse to verbal stimuli but quickly falls back to sleep.  RN states patient has had progressive decline over the last 12hrs 9/25 - -currently in medical ICU to [facility].  Bed 12.  She has been on BiPAP with Precedex overnight.  This morning on the low-dose of Precedex she got agitated.?  Also had melena.  She also desaturated.  Nursing then increase her Precedex and since then she has been more stable.  Yesterday dialysis was cut short.  9/26 -unable to run CRRT with prone ventilation.  Resume supine ventilation but still unable to draw blood back. 9/27 transferred to 86m. IR replaced HD catheter 10/1 on CRRT and vasopressors 10/6 - extubated 10/7 - ad some stridor and increased WOB so placed on BIPAP and dexamethasone given.  Consults:  PCCM  Procedures:  9/23 IR guided tunneled HD cath placement - left subclavian ?  Date right radial art line ?  Date right IJ 10/6 extubated 10/ 8 -reintubated.  Febrile to 103.  Started on cultures and antibiotics. 10/9 -  on vent 40%. Fent gtt, Precedex gtt, On levophed gtt - coming to nearly off. Fever coming down. RN reporting discolored mucus from lung. Tolerated HD despite perssors  Significant Diagnostic Tests:  Pulmonary VQ scan at 9/21 > negative  Micro Data:   03/09/2020 -tracheal aspirate -Staph MSSA 03/09/2020-b=BCx> NGTD   Antimicrobials:  Cefepime 9/24 > Azithromycin 9/21 > 9/26 Remdesivir completed 9/17 > 9/19 Tocilizumab x1  Vancomycin 03/09/2020-03/11/2020 Zosyn 03/09/2020-03/11/2020 Ceftriaxone 03/11/2020 >>  Interim history/subjective:  NAEO Due for HD today  Hgb 7.2 10/14 AM  Beginning to wean sedation this morning   Objective   Blood pressure 116/80, pulse (!) 110, temperature 99.3 F (37.4 C), temperature  source Oral, resp. rate (!) 23, height 5\' 2"  (1.575 m), weight 112.5 kg, SpO2 98 %.    Vent Mode: PRVC FiO2 (%):  [40 %-50 %] 40 % Set Rate:  [35 bmp] 35 bmp Vt Set:  [400 mL] 400 mL PEEP:  [8 cmH20] 8 cmH20 Plateau Pressure:  [22 cmH20-29 cmH20] 22 cmH20   Intake/Output Summary (Last 24 hours) at 03/15/2020 0905 Last data filed at 03/15/2020 0800 Gross per 24 hour  Intake 349.68 ml  Output --  Net 349.68 ml   Filed Weights   03/13/20 1907 03/14/20 0453 03/15/20 0351  Weight: 109.2 kg 109.7 kg 112.5 kg   General Appearance:  Critically ill appearing middle aged F, intubated sedated on HD NAD HEENT: NCAT pink mm ETT secure Trachea midline Lungs: BUL rhonchi. Copious thick pink tinged secretions. Diminished bibasilar sounds. Symmetrical chest expansion, mechanically ventilated  CV: reg rhythm tachycardic rate. s1s2 no rgm. Cap refill < 3sec Abdomen: Obese soft round ndnt. Thin brown stool collecting via BMS GU: WNL Extremities:  No obvious joint deformity. No cyanosis or clubbing. Symmetrical muscle bulk Skin:  C/d/w/i Neurologic:  Sedated. Marginal response to painful stimuli. Pupils pinpoint. Not following commands  Resolved Hospital Problem list   Mixed metabolic/respiratory acidosis  Shock, undifferentiated, improved Patient's echocardiogram shows RV overload.  Could be RV stunning from PVR increase due to COVID +/- septic effect of COVID, regardless, improved.  Assessment & Plan:   Acute hypoxic/hypercapnic respiratory failure due to ARDS secondary to COVID 19 Pneumonia Patient has completed steroid, remdesivir, immunomodulating therapy. Extubated 10/6 MSSA VAP Reintubated 10/8 P -Cont MV support -Rocephin -VAP -PAD -- new RASS goal 0 to -1, weaning sedation 10/14 -goal WUA/SBT qAM -is possible may need trach if unable to wean successfully   Encephalopathy Sedation for mechanical ventilation P -Wean sedation for goal RASS 0 to -1. Is possible may need enteral  sedation or change in present reg ( on fent, midaz) - would like to first try weaning before adding enteral or changing IV agents -If change to IV needed- would consider adding low-ceiling dexmet and dc vs decreasing midaz ceiling  -If enteral needed, could consider adding seroquel or olanzapine, enteral analgesic    MSSA VAP/HCAP  03/09/20  -WBC and fever curve overall slowly downtrending Plan -change to ceftriaxone and treat for 7 days -trend CBC T/R/Sa   ESRD on HD  - s/p CRRT ending 03/08/20 Hyperkalemia   - iHD per renal  Thrombocytopenia, at risk VTE- - eliquis 2.5mg  BID ( as laid out 03/08/20 by Dr Tamala Julian PCCM) x 61mo   Acute on chronic Anemia likely component of chronic disease, critical illness, iatrogenic blood loss via fq labs -hgb 7.2 on 10/14 from 8.2 10/11 P -hgb goal > 7  -CBC T/R/Sa (Hd days)  Poorly controlled diabetes with hyperglycemia, A1c 12.8% on admission -Better controlled with SSI and long-acting  Best practice:  Diet: EN  Pain/Anxiety/Delirium protocol (if indicated): Fent midaz  VAP protocol (if indicated): yes DVT prophylaxis: see above GI prophylaxis: PPI Glucose control: see above Mobility:  Bed rest Code Status: Full Family Communication:  Daughter Kaeli Nichelson (the one RN says MD should commuicate with) pending 10/14  Disposition:  ICU -- can leave COVID-19 ICU for non-isolation MICU. Needs Trach/LTAC  LABS  PULMONARY Recent Labs  Lab 03/09/20 1510 03/09/20 2053 03/11/20 0211 03/13/20 1135  PHART 7.193* 7.346* 7.421 7.327*  PCO2ART 74.4* 49.1* 42.0 47.5  PO2ART 142* 116* 108 122*  HCO3 27.7 26.6 27.2 24.7  TCO2 30 28 28 26   O2SAT 98.0 98.0 98.0 98.0    CBC Recent Labs  Lab 03/10/20 0352 03/10/20 1509 03/12/20 0403 03/13/20 1135 03/15/20 0717  HGB 6.4*   < > 8.2* 7.8* 7.2*  HCT 20.5*   < > 25.1* 23.0* 22.7*  WBC 17.9*  --  23.1*  --  19.0*  PLT 190  --  232  --  348   < > = values in this interval not displayed.     COAGULATION No results for input(s): INR in the last 168 hours.  CARDIAC  No results for input(s): TROPONINI in the last 168 hours. No results for input(s): PROBNP in the last 168 hours.   CHEMISTRY Recent Labs  Lab 03/09/20 0521 03/09/20 1510 03/10/20 0352 03/10/20 0352 03/11/20 0211 03/11/20 0211 03/11/20 0407 03/11/20 0407 03/12/20 0403 03/12/20 0403 03/13/20 1135 03/15/20 0716  NA  --    < > 141   < > 138  --  137  --  138  --  135 139  K  --    < > 5.1   < > 3.8   < > 4.2   < > 5.1   < > 6.4* 5.9*  CL  --   --  105  --   --   --  100  --  100  --   --  97*  CO2  --   --  23  --   --   --  25  --  24  --   --  25  GLUCOSE  --   --  263*  --   --   --  194*  --  328*  --   --  113*  BUN  --   --  65*  --   --   --  49*  --  74*  --   --  79*  CREATININE  --   --  4.96*  --   --   --  3.99*  --  6.04*  --   --  6.60*  CALCIUM  --   --  9.1  --   --   --  9.0  --  9.1  --   --  9.7  MG 2.7*  --  2.9*  --   --   --   --   --   --   --   --   --   PHOS  --   --  5.6*  --   --   --  4.8*  --  5.7*  --   --  7.1*   < > = values in this interval not displayed.   Estimated Creatinine Clearance: 12.2 mL/min (A) (by C-G formula based on SCr of 6.6 mg/dL (H)).   LIVER Recent Labs  Lab 03/10/20 0352 03/11/20 0407 03/12/20 0403 03/15/20 0716  AST  --   --  19  --   ALT  --   --  37  --   ALKPHOS  --   --  281*  --   BILITOT  --   --  1.3*  --   PROT  --   --  5.7*  --   ALBUMIN 1.4* 1.2*  1.1* 1.1*     INFECTIOUS Recent Labs  Lab 03/09/20 1534 03/10/20 0351 03/10/20 0352 03/11/20 0407  LATICACIDVEN  --  1.7  --   --   PROCALCITON 4.36  --  8.39 10.82     ENDOCRINE CBG (last 3)  Recent Labs    03/15/20 0047 03/15/20 0338 03/15/20 0803  GLUCAP 174* 134* 116*   CRITICAL CARE Performed by: Cristal Generous   Total critical care time: 40 minutes  Critical care time was exclusive of separately billable procedures and treating other  patients. Critical care was necessary to treat or prevent imminent or life-threatening deterioration.  Critical care was time spent personally by me on the following activities: development of treatment plan with patient and/or surrogate as well as nursing, discussions with consultants, evaluation of patient's response to treatment, examination of patient, obtaining history from patient or surrogate, ordering and performing treatments and interventions, ordering and review of laboratory studies, ordering and review of radiographic studies, pulse oximetry and re-evaluation of patient's condition.  Eliseo Gum MSN, AGACNP-BC [place] [phone] If no answer, [phone] 03/15/2020, 9:43 AM

## 2020-03-15 NOTE — Progress Notes (Addendum)
PCCM family communication note  Update: Reached daughter [name]. Discussed clinical updates, plan to continue decreasing midazolam and hopefully fentanyl, with addition of precedex. Discussed receiving HD today, and duration of abx. All questions answered __________________________________________  Attempted to reach daughter to provide clinical updates, directed to voicemail.   Eliseo Gum MSN, AGACNP-BC [place] [phone] If no answer, [phone] 03/15/2020, 1:59 PM

## 2020-03-15 NOTE — Progress Notes (Signed)
Nutrition Follow-up  DOCUMENTATION CODES:   Morbid obesity  INTERVENTION:   Tube feeding via Cortrak: Nepro @ 55 ml/h (1320 ml per day) Prosource TF 45 ml BID  Provides 2456 kcal, 128 gm protein, 959 ml free water daily.  Nutrisource Fiber BID Continue B-complex with vitamin C via tube.  NUTRITION DIAGNOSIS:   Inadequate oral intake related to inability to eat as evidenced by NPO status.  Ongoing   GOAL:   Patient will meet greater than or equal to 90% of their needs  Progressing   MONITOR:   PO intake, Supplement acceptance, Labs  REASON FOR ASSESSMENT:   Consult Assessment of nutrition requirement/status  ASSESSMENT:   49 year old female with a past medical history significant for end-stage renal disease on hemodialysis MWF, chronic hypoxic respiratory failure on 3 L nasal cannula at baseline, sleep apnea, diabetes, hypertension, hyperlipidemia, hypothyroidism, and asthma presents with complaints of worsening shortness of breath. Previously diagnosed with COVID PNA on 9/9.  Per CCM lightening sedation and attempting SBT.   10/4 cortrak placed; tip gastric 9/28-10/7 CRRT 10/8 pt reintubated 10/14 s/p iHD  Patient is currently intubated on ventilator support MV: 13 L/min Temp (24hrs), Avg:99.5 F (37.5 C), Min:97.7 F (36.5 C), Max:101.9 F (38.8 C)   Labs reviewed. BUN 79, Creat 6.6, Phos 7.1, K+: 6.4 -> 5.9 CBG: 116-116-219  Medications reviewed and include B-complex with C, Aranesp, Colace, Novolog 12 units every 4 hours, Levemir 50 units every 12 hours Precedex Fentanyl Versed  I/O even UF: 3000 ml  Current TF: Vital 1.5 @ 55 ml/hr with 45 ml ProSource TF TID Provides: 2100 kcal and 122 grams protein  Diet Order:   Diet Order    None      EDUCATION NEEDS:   Not appropriate for education at this time  Skin:  Skin Assessment: Skin Integrity Issues: Skin Integrity Issues:: Stage II Stage II: R buttocks  Last BM:  200 via rectal  tube  Height:   Ht Readings from Last 1 Encounters:  03/09/20 5\' 2"  (1.575 m)    Weight:   Wt Readings from Last 1 Encounters:  03/15/20 106.4 kg    Ideal Body Weight:  50 kg  BMI:  Body mass index is 42.9 kg/m.  Estimated Nutritional Needs:   Kcal:  2065-2260  Protein:  110-125 gm  Fluid:  1 L + UOP   Cambria Osten P., RD, LDN, CNSC See AMiON for contact information

## 2020-03-16 DIAGNOSIS — R739 Hyperglycemia, unspecified: Secondary | ICD-10-CM | POA: Diagnosis not present

## 2020-03-16 DIAGNOSIS — J9601 Acute respiratory failure with hypoxia: Secondary | ICD-10-CM | POA: Diagnosis not present

## 2020-03-16 DIAGNOSIS — J159 Unspecified bacterial pneumonia: Secondary | ICD-10-CM | POA: Diagnosis not present

## 2020-03-16 DIAGNOSIS — G9340 Encephalopathy, unspecified: Secondary | ICD-10-CM | POA: Diagnosis not present

## 2020-03-16 LAB — GLUCOSE, CAPILLARY
Glucose-Capillary: 101 mg/dL — ABNORMAL HIGH (ref 70–99)
Glucose-Capillary: 103 mg/dL — ABNORMAL HIGH (ref 70–99)
Glucose-Capillary: 125 mg/dL — ABNORMAL HIGH (ref 70–99)
Glucose-Capillary: 148 mg/dL — ABNORMAL HIGH (ref 70–99)
Glucose-Capillary: 155 mg/dL — ABNORMAL HIGH (ref 70–99)
Glucose-Capillary: 44 mg/dL — CL (ref 70–99)
Glucose-Capillary: 71 mg/dL (ref 70–99)
Glucose-Capillary: 83 mg/dL (ref 70–99)
Glucose-Capillary: 83 mg/dL (ref 70–99)
Glucose-Capillary: 89 mg/dL (ref 70–99)

## 2020-03-16 MED ORDER — CHLORHEXIDINE GLUCONATE CLOTH 2 % EX PADS
6.0000 | MEDICATED_PAD | Freq: Every day | CUTANEOUS | Status: DC
  Administered 2020-03-16 – 2020-04-19 (×35): 6 via TOPICAL

## 2020-03-16 MED ORDER — QUETIAPINE FUMARATE 50 MG PO TABS
25.0000 mg | ORAL_TABLET | Freq: Two times a day (BID) | ORAL | Status: DC
  Administered 2020-03-16 – 2020-03-19 (×8): 25 mg
  Filled 2020-03-16 (×3): qty 1
  Filled 2020-03-16: qty 0.5
  Filled 2020-03-16 (×4): qty 1

## 2020-03-16 NOTE — Progress Notes (Signed)
NAME:  [name], MRN:  [id number], DOB:  [birth year], LOS: 56 ADMISSION DATE:  02/13/2020, CONSULTATION DATE: 02/24/2020 REFERRING MD: Dr. Heber [place], CHIEF COMPLAINT: Increasing supplemental oxygen demand  Brief History   Wanda Ramirez is a 49 year old female with a past medical history significant for end-stage renal disease on hemodialysis MWF, chronic hypoxic respiratory failure on 3 L nasal cannula at baseline, sleep apnea, diabetes, hypertension, hyperlipidemia, hypothyroidism, and asthma who presented to the emergency department with complaints of worsening shortness of breath, dry cough, generalized body aches, and loose stools.  Patient was diagnosed 9/9 with Covid pneumonia.  Initially she was able to maintain outpatient status but when symptoms worsened 9/14 she presented to the emergency department.  Patient reported dyspnea and weakness worsened to the point where she was unable to ambulate effectively.  She also reports she was unable to maintain adequate oral intake.  Patient received the moderna vaccines in March and April of this year.  Patient presented initially febrile with a temperature of 102.9 and mildly tachypneic with all other vital signs within normal limits.  She was initially managed on 4 L nasal cannula.  Lab work significant for NA 130, K3.8, glucose 454, anion gap 21, lactic acid 1.8, LDH 296, ferritin 6690, CRP 13.3, and procalcitonin 10.51  On review of medical record it appears patient has progressively required increased supplemental oxygen requirement.  Afternoon of 9/24 patient had episode of desaturation requiring further increase in supplemental oxygen.  On chart assessment it appears patient has been placed on 40 L with reported increased work of breathing prompting PCCM consultation  Past Medical History  Sleep apnea Morbid obesity Hypothyroidism Hypertension GERD  End-stage renal disease on HD MWF Diabetes Asthma   Immunization History   Administered Date(s) Administered  . Influenza,inj,Quad PF,6+ Mos 02/09/2018     Significant Hospital Events   Admitted 9/14  9/24 - Lying in bed on side currently undergoing iHD,  She is lethargic but will arouse to verbal stimuli but quickly falls back to sleep.  RN states patient has had progressive decline over the last 12hrs 9/25 - -currently in medical ICU to [facility].  Bed 12.  She has been on BiPAP with Precedex overnight.  This morning on the low-dose of Precedex she got agitated.?  Also had melena.  She also desaturated.  Nursing then increase her Precedex and since then she has been more stable.  Yesterday dialysis was cut short.  9/26 -unable to run CRRT with prone ventilation.  Resume supine ventilation but still unable to draw blood back. 9/27 transferred to 40m. IR replaced HD catheter 10/1 on CRRT and vasopressors 10/6 - extubated 10/7 - ad some stridor and increased WOB so placed on BIPAP and dexamethasone given.  10/15 weaning sedation. midaz off Consults:  PCCM  Procedures:  9/23 IR guided tunneled HD cath placement - left subclavian ?  Date right radial art line ?  Date right IJ 10/6 extubated 10/ 8 -reintubated.  Febrile to 103.  Started on cultures and antibiotics. 10/9 -  on vent 40%. Fent gtt, Precedex gtt, On levophed gtt - coming to nearly off. Fever coming down. RN reporting discolored mucus from lung. Tolerated HD despite perssors  Significant Diagnostic Tests:  Pulmonary VQ scan at 9/21 > negative  Micro Data:   03/09/2020 -tracheal aspirate -Staph MSSA 03/09/2020-b=BCx> NGTD   Antimicrobials:  Cefepime 9/24 > Azithromycin 9/21 > 9/26 Remdesivir completed 9/17 > 9/19 Tocilizumab x1  Vancomycin 03/09/2020-03/11/2020 Zosyn 03/09/2020-03/11/2020 Ceftriaxone 03/11/2020 >>  Interim history/subjective:  NAEO Midaz has been weaned to 2.  Is on PSV/CPAP this morning good volumes and RR. Limited by mentation   Febrile 101.3    Objective   Blood  pressure 124/77, pulse (!) 117, temperature (!) 101.3 F (38.5 C), temperature source Oral, resp. rate (!) 33, height 5\' 2"  (1.575 m), weight 108.7 kg, SpO2 93 %.    Vent Mode: PRVC FiO2 (%):  [40 %] 40 % Set Rate:  [35 bmp] 35 bmp Vt Set:  [400 mL] 400 mL PEEP:  [8 cmH20] 8 cmH20 Plateau Pressure:  [22 cmH20] 22 cmH20   Intake/Output Summary (Last 24 hours) at 03/16/2020 0953 Last data filed at 03/16/2020 0900 Gross per 24 hour  Intake 1186.98 ml  Output 3220 ml  Net -2033.02 ml   Filed Weights   03/15/20 0351 03/15/20 1129 03/16/20 0357  Weight: 112.5 kg 106.4 kg 108.7 kg   General Appearance:  Critically and chronically ill appearing middle aged F, reclined in bed intubated sedated NAD HEENT: NCAT cortrak and ETT secure. Anicteric sclera Lungs:  Scattered rhonchi. Thick tan secretions with pink tinge. No accessory use on PSV/CPAP  CV: RRR s1s2 no rgm cap refill brisk 2+ peripheral pulse Abdomen: obese, soft, ndnt + bowel sounds GU:  wnl Extremities:  1+ pitting edema. No obvious joint deformity no cyanosis or clubbing  Skin:  C/d/w.  Neurologic:  Opens eyes to stimulation does not follow commands. [facility] Problem list   Mixed metabolic/respiratory acidosis  Shock, undifferentiated, improved Patient's echocardiogram shows RV overload.  Could be RV stunning from PVR increase due to COVID +/- septic effect of COVID, regardless, improved.  Assessment & Plan:   Acute hypoxic/hypercapnic respiratory failure due to ARDS secondary to COVID 19 Pneumonia Patient has completed steroid, remdesivir, immunomodulating therapy. Extubated 10/6 MSSA VAP Reintubated 10/8 P -Cont MV support -Rocephin -VAP -PAD -goal WUA/SBT qAM  -suspect we are heading toward trach. Family is open to this and largely defers to medical team's recommendation should it become indicated   MSSA VAP/HCAP  -again febrile 10/15 Plan -7d course of rocephin  -trend CBC T/R/Sa  -PRN  APAP  Encephalopathy -suspect component of ICU delirium at this point Sedation for mechanical ventilation -fent and midaz gtts weaned in half 10/14-10/15 P - Dc midaz gtt, incr dexmet gtt. Adding low dose enteral seroquel. Continue to wean fent gtt, will lower ceiling  -RASS goal 0 to -1  -next move would likely be adding enteral analgesia vs incr seroquel -delirium precautions    ESRD on HD  - s/p CRRT ending 03/08/20 Hyperkalemia   - iHD per renal  Thrombocytopenia, at risk VTE- - eliquis 2.5mg  BID ( as laid out 03/08/20 by Dr Tamala Julian PCCM) x 38mo   Acute on chronic Anemia likely component of chronic disease, critical illness, iatrogenic blood loss via fq labs -hgb 7.2 on 10/14 from 8.2 10/11 P -hgb goal > 7  -CBC T/R/Sa (Hd days)  Poorly controlled diabetes with hyperglycemia, A1c 12.8% on admission -Basal + SSI + q4 EN coverage  Hypothyroidism -synthroid  Best practice:  Diet: EN  Pain/Anxiety/Delirium protocol (if indicated): Fent midaz  VAP protocol (if indicated): yes DVT prophylaxis: see above GI prophylaxis: PPI Glucose control: Basal + SSI + q4 EN coverage  Mobility:  Bed rest Code Status: Full Family Communication:  Daughter Chandra Asher (the one RN says MD should commuicate with) Long discussion 10/14, see documentation. Pending 10/15  Disposition:  ICU -- can leave  COVID-19 ICU for non-isolation MICU.    LABS    PULMONARY Recent Labs  Lab 03/09/20 1510 03/09/20 2053 03/11/20 0211 03/13/20 1135  PHART 7.193* 7.346* 7.421 7.327*  PCO2ART 74.4* 49.1* 42.0 47.5  PO2ART 142* 116* 108 122*  HCO3 27.7 26.6 27.2 24.7  TCO2 30 28 28 26   O2SAT 98.0 98.0 98.0 98.0    CBC Recent Labs  Lab 03/10/20 0352 03/10/20 1509 03/12/20 0403 03/13/20 1135 03/15/20 0717  HGB 6.4*   < > 8.2* 7.8* 7.2*  HCT 20.5*   < > 25.1* 23.0* 22.7*  WBC 17.9*  --  23.1*  --  19.0*  PLT 190  --  232  --  348   < > = values in this interval not displayed.     COAGULATION No results for input(s): INR in the last 168 hours.  CARDIAC  No results for input(s): TROPONINI in the last 168 hours. No results for input(s): PROBNP in the last 168 hours.   CHEMISTRY Recent Labs  Lab 03/10/20 0352 03/10/20 0352 03/11/20 0211 03/11/20 0211 03/11/20 0407 03/11/20 0407 03/12/20 0403 03/12/20 0403 03/13/20 1135 03/15/20 0716  NA 141   < > 138  --  137  --  138  --  135 139  K 5.1   < > 3.8   < > 4.2   < > 5.1   < > 6.4* 5.9*  CL 105  --   --   --  100  --  100  --   --  97*  CO2 23  --   --   --  25  --  24  --   --  25  GLUCOSE 263*  --   --   --  194*  --  328*  --   --  113*  BUN 65*  --   --   --  49*  --  74*  --   --  79*  CREATININE 4.96*  --   --   --  3.99*  --  6.04*  --   --  6.60*  CALCIUM 9.1  --   --   --  9.0  --  9.1  --   --  9.7  MG 2.9*  --   --   --   --   --   --   --   --   --   PHOS 5.6*  --   --   --  4.8*  --  5.7*  --   --  7.1*   < > = values in this interval not displayed.   Estimated Creatinine Clearance: 12 mL/min (A) (by C-G formula based on SCr of 6.6 mg/dL (H)).   LIVER Recent Labs  Lab 03/10/20 0352 03/11/20 0407 03/12/20 0403 03/15/20 0716  AST  --   --  19  --   ALT  --   --  37  --   ALKPHOS  --   --  281*  --   BILITOT  --   --  1.3*  --   PROT  --   --  5.7*  --   ALBUMIN 1.4* 1.2* 1.1* 1.1*     INFECTIOUS Recent Labs  Lab 03/09/20 1534 03/10/20 0351 03/10/20 0352 03/11/20 0407  LATICACIDVEN  --  1.7  --   --   PROCALCITON 4.36  --  8.39 10.82     ENDOCRINE CBG (last 3)  Recent Labs  03/16/20 0328 03/16/20 0734 03/16/20 0757  GLUCAP 103* 148* 155*     CRITICAL CARE Performed by: Cristal Generous   Total critical care time: 40 minutes  Critical care time was exclusive of separately billable procedures and treating other patients. Critical care was necessary to treat or prevent imminent or life-threatening deterioration.  Critical care was time spent personally  by me on the following activities: development of treatment plan with patient and/or surrogate as well as nursing, discussions with consultants, evaluation of patient's response to treatment, examination of patient, obtaining history from patient or surrogate, ordering and performing treatments and interventions, ordering and review of laboratory studies, ordering and review of radiographic studies, pulse oximetry and re-evaluation of patient's condition.  Eliseo Gum MSN, AGACNP-BC [place] [phone] If no answer, [phone] 03/16/2020, 9:55 AM

## 2020-03-16 NOTE — Progress Notes (Signed)
Assisted tele visit to patient with family member.  Aireana Ryland M Asia Dusenbury, RN  

## 2020-03-16 NOTE — Progress Notes (Signed)
[facility] ROUNDING NOTE   Subjective:   Brief history: This a 49 year old lady with a history of end-stage renal disease Monday Wednesday Friday dialysis chronic hypoxic respiratory failure, she is on 3 L nasal cannula at baseline.  She has a history of diabetes hypertension hyperlipidemia hypothyroidism.  She was diagnosed with Covid pneumonia 02/09/2020.  She was admitted 02/13/2020.  She required intubation and CRRT on vasopressors 03/02/2020 - 10/7.  Last hemodialysis 03/15/2020 with 3 L removed next dialysis 03/17/2020  Blood pressure 126/76 pulse 115 temperature 99.8 O2 sats 91% FiO2 40%  Labs pending 03/15/2020  Eliquis 2.5 mg twice daily, insulin sliding scale, Levemir 24 units every 12 hours, levothyroxine 125 mcg daily, Protonix 40 mg twice daily, darbepoetin 150 mcg every Tuesday   IV Rocephin IV heparin       Objective:  Vital signs in last 24 hours:  Temp:  [98.8 F (37.1 C)-101.9 F (38.8 C)] 99.8 F (37.7 C) (10/15 0336) Pulse Rate:  [80-137] 119 (10/15 0600) Resp:  [16-41] 29 (10/15 0600) BP: (78-160)/(49-98) 123/73 (10/15 0600) SpO2:  [92 %-100 %] 93 % (10/15 0600) FiO2 (%):  [40 %-50 %] 40 % (10/15 0600) Weight:  [106.4 kg-108.7 kg] 108.7 kg (10/15 0357)  Weight change: -6.1 kg Filed Weights   03/15/20 0351 03/15/20 1129 03/16/20 0357  Weight: 112.5 kg 106.4 kg 108.7 kg    Intake/Output: I/O last 3 completed shifts: In: 1276.6 [I.V.:416.6; NG/GT:660; IV Piggyback:200] Out: 3150 [Other:3000; Stool:150]   Intake/Output this shift:  Total I/O In: 192.5 [I.V.:192.5] Out: -   Critically ill obese CVS- RRR without murmurs rubs or gallops RS- CTA ventilator dependent ETT tube ABD- BS present soft non-distended   EXT- no edema   Basic Metabolic Panel: Recent Labs  Lab 03/10/20 0352 03/10/20 0352 03/11/20 0211 03/11/20 0407 03/12/20 0403 03/13/20 1135 03/15/20 0716  NA 141   < > 138 137 138 135 139  K 5.1   < > 3.8 4.2 5.1  6.4* 5.9*  CL 105  --   --  100 100  --  97*  CO2 23  --   --  25 24  --  25  GLUCOSE 263*  --   --  194* 328*  --  113*  BUN 65*  --   --  49* 74*  --  79*  CREATININE 4.96*  --   --  3.99* 6.04*  --  6.60*  CALCIUM 9.1   < >  --  9.0 9.1  --  9.7  MG 2.9*  --   --   --   --   --   --   PHOS 5.6*  --   --  4.8* 5.7*  --  7.1*   < > = values in this interval not displayed.    Liver Function Tests: Recent Labs  Lab 03/10/20 0352 03/11/20 0407 03/12/20 0403 03/15/20 0716  AST  --   --  19  --   ALT  --   --  37  --   ALKPHOS  --   --  281*  --   BILITOT  --   --  1.3*  --   PROT  --   --  5.7*  --   ALBUMIN 1.4* 1.2* 1.1* 1.1*   No results for input(s): LIPASE, AMYLASE in the last 168 hours. No results for input(s): AMMONIA in the last 168 hours.  CBC: Recent Labs  Lab 03/10/20 0352 03/10/20 0352 03/10/20  1509 03/11/20 0211 03/12/20 0403 03/13/20 1135 03/15/20 0717  WBC 17.9*  --   --   --  23.1*  --  19.0*  HGB 6.4*   < > 8.2* 8.5* 8.2* 7.8* 7.2*  HCT 20.5*   < > 25.4* 25.0* 25.1* 23.0* 22.7*  MCV 98.1  --   --   --  92.6  --  93.8  PLT 190  --   --   --  232  --  348   < > = values in this interval not displayed.    Cardiac Enzymes: No results for input(s): CKTOTAL, CKMB, CKMBINDEX, TROPONINI in the last 168 hours.  BNP: Invalid input(s): POCBNP  CBG: Recent Labs  Lab 03/15/20 1546 03/15/20 1930 03/15/20 2346 03/16/20 0033 03/16/20 0328  GLUCAP 219* 174* 71 107 103*    Microbiology: Results for orders placed or performed during the hospital encounter of 02/13/20  Culture, blood (Routine x 2)     Status: None   Collection Time: 02/13/20  7:02 PM   Specimen: BLOOD RIGHT HAND  Result Value Ref Range Status   Specimen Description BLOOD RIGHT HAND  Final   Special Requests   Final    BOTTLES DRAWN AEROBIC AND ANAEROBIC Blood Culture adequate volume   Culture   Final    NO GROWTH 5 DAYS Performed at [facility], [place] [address],  [place], [place] [address]    Report Status 02/18/2020 FINAL  Final  SARS Coronavirus 2 by RT PCR (hospital order, performed in [facility] hospital lab) Nasopharyngeal Nasopharyngeal Swab     Status: Abnormal   Collection Time: 02/14/20  8:33 AM   Specimen: Nasopharyngeal Swab  Result Value Ref Range Status   SARS Coronavirus 2 POSITIVE (A) NEGATIVE Final    Comment: RESULT CALLED TO, READ BACK BY AND VERIFIED WITH: RN T.SHORE AT 1110 ON 02/14/2020 BY T.SAAD (NOTE) SARS-CoV-2 target nucleic acids are DETECTED  SARS-CoV-2 RNA is generally detectable in upper respiratory specimens  during the acute phase of infection.  Positive results are indicative  of the presence of the identified virus, but do not rule out bacterial infection or co-infection with other pathogens not detected by the test.  Clinical correlation with patient history and  other diagnostic information is necessary to determine patient infection status.  The expected result is negative.  Fact Sheet for Patients:   StrictlyIdeas.no   Fact Sheet for Healthcare Providers:   BankingDealers.co.za    This test is not yet approved or cleared by the [place] FDA and  has been authorized for detection and/or diagnosis of SARS-CoV-2 by FDA under an Emergency Use Authorization (EUA).  This EUA will remain in effect (meaning  this test can be used) for the duration of  the COVID-19 declaration under Section 564(b)(1) of the Act, 21 U.S.C. section 360-bbb-3(b)(1), unless the authorization is terminated or revoked sooner.  Performed at [facility], [place] [address] [place], [place] [address]   Culture, blood (Routine x 2)     Status: None   Collection Time: 02/14/20 10:39 AM   Specimen: BLOOD RIGHT FOREARM  Result Value Ref Range Status   Specimen Description BLOOD RIGHT FOREARM  Final   Special Requests   Final    BOTTLES DRAWN AEROBIC AND ANAEROBIC Blood Culture  adequate volume   Culture   Final    NO GROWTH 5 DAYS Performed at [facility], [place] [address], [place], [place] [address]  Report Status 02/19/2020 FINAL  Final  Culture, blood (Routine X 2) w Reflex to ID Panel     Status: None   Collection Time: 02/26/20  4:25 PM   Specimen: BLOOD RIGHT HAND  Result Value Ref Range Status   Specimen Description BLOOD RIGHT HAND  Final   Special Requests   Final    BOTTLES DRAWN AEROBIC AND ANAEROBIC Blood Culture adequate volume   Culture   Final    NO GROWTH 5 DAYS Performed at [facility], [place] [address], [place], [place] [address]    Report Status 03/02/2020 FINAL  Final  Culture, blood (Routine X 2) w Reflex to ID Panel     Status: None   Collection Time: 02/26/20  4:31 PM   Specimen: BLOOD RIGHT ARM  Result Value Ref Range Status   Specimen Description BLOOD RIGHT ARM  Final   Special Requests   Final    BOTTLES DRAWN AEROBIC ONLY Blood Culture results may not be optimal due to an inadequate volume of blood received in culture bottles   Culture   Final    NO GROWTH 5 DAYS Performed at [facility], [place] [address] [place], [place] [address]    Report Status 03/02/2020 FINAL  Final  MRSA PCR Screening     Status: None   Collection Time: 02/27/20  9:14 AM   Specimen: Nasopharyngeal  Result Value Ref Range Status   MRSA by PCR NEGATIVE NEGATIVE Final    Comment:        The GeneXpert MRSA Assay (FDA approved for NASAL specimens only), is one component of a comprehensive MRSA colonization surveillance program. It is not intended to diagnose MRSA infection nor to guide or monitor treatment for MRSA infections. Performed at [facility], [place] [address] [place], [place] [address]   Culture, respiratory (non-expectorated)     Status: None   Collection Time: 03/09/20  3:01 PM   Specimen: Tracheal Aspirate; Respiratory  Result Value Ref Range Status   Specimen Description TRACHEAL ASPIRATE  Final   Special  Requests NONE  Final   Gram Stain   Final    ABUNDANT WBC PRESENT,BOTH PMN AND MONONUCLEAR ABUNDANT GRAM POSITIVE COCCI ABUNDANT GRAM NEGATIVE RODS FEW GRAM VARIABLE ROD Performed at [facility], [place] [address] [place], [place] [address]    Culture ABUNDANT STAPHYLOCOCCUS AUREUS  Final   Report Status 03/11/2020 FINAL  Final   Organism ID, Bacteria STAPHYLOCOCCUS AUREUS  Final      Susceptibility   Staphylococcus aureus - MIC*    CIPROFLOXACIN <=0.5 SENSITIVE Sensitive     ERYTHROMYCIN >=8 RESISTANT Resistant     GENTAMICIN <=0.5 SENSITIVE Sensitive     OXACILLIN <=0.25 SENSITIVE Sensitive     TETRACYCLINE <=1 SENSITIVE Sensitive     VANCOMYCIN <=0.5 SENSITIVE Sensitive     TRIMETH/SULFA <=10 SENSITIVE Sensitive     CLINDAMYCIN RESISTANT Resistant     RIFAMPIN <=0.5 SENSITIVE Sensitive     Inducible Clindamycin POSITIVE Resistant     * ABUNDANT STAPHYLOCOCCUS AUREUS  Culture, blood (Routine X 2) w Reflex to ID Panel     Status: None   Collection Time: 03/09/20  3:15 PM   Specimen: BLOOD RIGHT HAND  Result Value Ref Range Status   Specimen Description BLOOD RIGHT HAND  Final   Special Requests   Final    BOTTLES DRAWN AEROBIC AND ANAEROBIC Blood Culture adequate volume   Culture   Final  NO GROWTH 5 DAYS Performed at [facility], [place] [address] [place], [place] [address]    Report Status 03/14/2020 FINAL  Final  Culture, blood (Routine X 2) w Reflex to ID Panel     Status: None   Collection Time: 03/09/20  3:30 PM   Specimen: BLOOD RIGHT HAND  Result Value Ref Range Status   Specimen Description BLOOD RIGHT HAND  Final   Special Requests   Final    BOTTLES DRAWN AEROBIC AND ANAEROBIC Blood Culture adequate volume   Culture   Final    NO GROWTH 5 DAYS Performed at [facility], [place] [address], [place], [place] [address]    Report Status 03/14/2020 FINAL  Final    Coagulation Studies: No results for input(s): LABPROT, INR in the last 72  hours.  Urinalysis: No results for input(s): COLORURINE, LABSPEC, PHURINE, GLUCOSEU, HGBUR, BILIRUBINUR, KETONESUR, PROTEINUR, UROBILINOGEN, NITRITE, LEUKOCYTESUR in the last 72 hours.  Invalid input(s): APPERANCEUR    Imaging: No results found.   Medications:   . sodium chloride Stopped (03/11/20 1509)  . sodium chloride    . sodium chloride    . sodium chloride    . cefTRIAXone (ROCEPHIN)  IV Stopped (03/15/20 1236)  . dexmedetomidine (PRECEDEX) IV infusion 0.4 mcg/kg/hr (03/16/20 0600)  . dextrose    . feeding supplement (NEPRO CARB STEADY) 55 mL/hr at 03/16/20 0224  . fentaNYL infusion INTRAVENOUS 150 mcg/hr (03/16/20 0600)  . midazolam (VERSED) infusion 4 mg/hr (03/16/20 0600)   . sodium chloride   Intravenous Once  . apixaban  2.5 mg Per Tube BID  . B-complex with vitamin C  1 tablet Per Tube Daily  . chlorhexidine gluconate (MEDLINE KIT)  15 mL Mouth Rinse BID  . Chlorhexidine Gluconate Cloth  6 each Topical Q0600  . Chlorhexidine Gluconate Cloth  6 each Topical Q0600  . Chlorhexidine Gluconate Cloth  6 each Topical Q0600  . darbepoetin (ARANESP) injection - DIALYSIS  150 mcg Intravenous Q Tue-HD  . docusate  100 mg Per Tube BID  . feeding supplement (PROSource TF)  45 mL Per Tube BID  . fiber  1 packet Per Tube BID  . insulin aspart  0-20 Units Subcutaneous Q4H  . insulin aspart  12 Units Subcutaneous Q4H  . insulin detemir  40 Units Subcutaneous Q12H  . levothyroxine  125 mcg Per Tube Q0600  . mouth rinse  15 mL Mouth Rinse 10 times per day  . pantoprazole sodium  40 mg Per Tube BID  . sodium chloride flush  10-40 mL Intracatheter Q12H  . sodium chloride flush  3 mL Intravenous Q12H   sodium chloride, sodium chloride, sodium chloride, sodium chloride, acetaminophen (TYLENOL) oral liquid 160 mg/5 mL, albuterol, alteplase, dextrose, dextrose, fentaNYL, fentaNYL (SUBLIMAZE) injection, heparin, heparin, influenza vac split quadrivalent PF, lidocaine (PF),  lidocaine-prilocaine, lip balm, midazolam, pentafluoroprop-tetrafluoroeth, sodium chloride flush, sodium chloride flush   OP HD: TTS  4h 9mn 450/800  2/2.25 bath  111.5kg  Hep 2000  L AVF  - darbe 50 ug q week, last 9/7  - calc 1.0 tiw  - 9/9 Hb 10.0, tsat 22%  Assessment/ Plan:  # COVID pna w/ acute on chronic hypoxic respiratory failure:  dexamethasone, status post tocilizumab and remdesivir.  Severe CXR changes.    # Acute hypoxic resp failure  - extubated on 10/6 > reintubated on 10/8  #  Staph aureus VAP/ HCAP 03/09/20 - restarted on IV abx Rocephin  # ESRD:usual HD  is TTS.  - sp CRRT 9/28- 10/7.  Next dialysis will be scheduled for 03/17/2020   # Clotted LUA AVF: note TDC placed by IR 9/11. Will need new perm access at some point when pt is more stable. Tunneled catheter exchanged on 9/27 with IR.   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 Pt back on vent. Ca and phos in range.   #DM2 - management per primary team    LOS: [place] _0 _1 :55 AM

## 2020-03-16 NOTE — Progress Notes (Signed)
Assisted tele visit to patient with family member.  Dana Debo D Demetrica Zipp, RN   

## 2020-03-16 NOTE — Progress Notes (Signed)
100 mL IV Midazolam wasted in Stericycle with Lyla Son RN

## 2020-03-17 DIAGNOSIS — U071 COVID-19: Secondary | ICD-10-CM | POA: Diagnosis not present

## 2020-03-17 LAB — RENAL FUNCTION PANEL
Albumin: 1.1 g/dL — ABNORMAL LOW (ref 3.5–5.0)
Albumin: 1.2 g/dL — ABNORMAL LOW (ref 3.5–5.0)
Anion gap: 17 — ABNORMAL HIGH (ref 5–15)
Anion gap: 14 (ref 5–15)
BUN: 79 mg/dL — ABNORMAL HIGH (ref 6–20)
BUN: 36 mg/dL — ABNORMAL HIGH (ref 6–20)
CO2: 23 mmol/L (ref 22–32)
CO2: 26 mmol/L (ref 22–32)
Calcium: 9.6 mg/dL (ref 8.9–10.3)
Calcium: 8.9 mg/dL (ref 8.9–10.3)
Chloride: 99 mmol/L (ref 98–111)
Chloride: 94 mmol/L — ABNORMAL LOW (ref 98–111)
Creatinine, Ser: 6.62 mg/dL — ABNORMAL HIGH (ref 0.44–1.00)
Creatinine, Ser: 3.49 mg/dL — ABNORMAL HIGH (ref 0.44–1.00)
GFR, Estimated: 7 mL/min — ABNORMAL LOW (ref >60)
GFR, Estimated: 15 mL/min — ABNORMAL LOW (ref >60)
Glucose, Bld: 210 mg/dL — ABNORMAL HIGH (ref 70–99)
Glucose, Bld: 173 mg/dL — ABNORMAL HIGH (ref 70–99)
Phosphorus: 7.7 mg/dL — ABNORMAL HIGH (ref 2.5–4.6)
Phosphorus: 4.6 mg/dL (ref 2.5–4.6)
Potassium: 4.5 mmol/L (ref 3.5–5.1)
Potassium: 3.8 mmol/L (ref 3.5–5.1)
Sodium: 139 mmol/L (ref 135–145)
Sodium: 134 mmol/L — ABNORMAL LOW (ref 135–145)

## 2020-03-17 LAB — CBC
HCT: 21.9 % — ABNORMAL LOW (ref 36.0–46.0)
HCT: 25.2 % — ABNORMAL LOW (ref 36.0–46.0)
Hemoglobin: 6.9 g/dL — CL (ref 12.0–15.0)
Hemoglobin: 8 g/dL — ABNORMAL LOW (ref 12.0–15.0)
MCH: 30.3 pg (ref 26.0–34.0)
MCH: 30 pg (ref 26.0–34.0)
MCHC: 31.5 g/dL (ref 30.0–36.0)
MCHC: 31.7 g/dL (ref 30.0–36.0)
MCV: 96.1 fL (ref 80.0–100.0)
MCV: 94.4 fL (ref 80.0–100.0)
Platelets: 423 10*3/uL — ABNORMAL HIGH (ref 150–400)
Platelets: 405 10*3/uL — ABNORMAL HIGH (ref 150–400)
RBC: 2.28 MIL/uL — ABNORMAL LOW (ref 3.87–5.11)
RBC: 2.67 MIL/uL — ABNORMAL LOW (ref 3.87–5.11)
RDW: 19.3 % — ABNORMAL HIGH (ref 11.5–15.5)
RDW: 18.2 % — ABNORMAL HIGH (ref 11.5–15.5)
WBC: 22.7 10*3/uL — ABNORMAL HIGH (ref 4.0–10.5)
WBC: 22.1 10*3/uL — ABNORMAL HIGH (ref 4.0–10.5)
nRBC: 0 % (ref 0.0–0.2)
nRBC: 0 % (ref 0.0–0.2)

## 2020-03-17 LAB — GLUCOSE, CAPILLARY
Glucose-Capillary: 123 mg/dL — ABNORMAL HIGH (ref 70–99)
Glucose-Capillary: 124 mg/dL — ABNORMAL HIGH (ref 70–99)
Glucose-Capillary: 161 mg/dL — ABNORMAL HIGH (ref 70–99)
Glucose-Capillary: 184 mg/dL — ABNORMAL HIGH (ref 70–99)
Glucose-Capillary: 325 mg/dL — ABNORMAL HIGH (ref 70–99)
Glucose-Capillary: 82 mg/dL (ref 70–99)

## 2020-03-17 LAB — PREPARE RBC (CROSSMATCH)

## 2020-03-17 MED ORDER — HEPARIN SODIUM (PORCINE) 1000 UNIT/ML DIALYSIS: 1000.0000 [IU] | INTRAMUSCULAR | Status: DC | PRN

## 2020-03-17 MED ORDER — ALTEPLASE 2 MG IJ SOLR: 2.0000 mg | Freq: Once | INTRAMUSCULAR | Status: DC | PRN

## 2020-03-17 MED ORDER — MIDAZOLAM HCL 2 MG/2ML IJ SOLN
6.0000 mg | Freq: Once | INTRAMUSCULAR | Status: AC
  Administered 2020-03-19: 6 mg via INTRAVENOUS
  Filled 2020-03-17: qty 6

## 2020-03-17 MED ORDER — PENTAFLUOROPROP-TETRAFLUOROETH EX AERO: 1.0000 "application " | INHALATION_SPRAY | CUTANEOUS | Status: DC | PRN

## 2020-03-17 MED ORDER — LIDOCAINE-PRILOCAINE 2.5-2.5 % EX CREA: 1.0000 "application " | TOPICAL_CREAM | CUTANEOUS | Status: DC | PRN

## 2020-03-17 MED ORDER — SODIUM CHLORIDE 0.9 % IV SOLN: 100.0000 mL | INTRAVENOUS | Status: DC | PRN

## 2020-03-17 MED ORDER — PROPOFOL 10 MG/ML IV BOLUS
30.0000 mg | Freq: Once | INTRAVENOUS | Status: DC
  Filled 2020-03-17: qty 20

## 2020-03-17 MED ORDER — FENTANYL CITRATE (PF) 100 MCG/2ML IJ SOLN
100.0000 ug | Freq: Once | INTRAMUSCULAR | Status: AC
  Administered 2020-03-19: 100 ug via INTRAVENOUS
  Filled 2020-03-17: qty 2

## 2020-03-17 MED ORDER — HEPARIN SODIUM (PORCINE) 1000 UNIT/ML IJ SOLN
INTRAMUSCULAR | Status: AC
  Administered 2020-03-17: 1000 [IU] via INTRAVENOUS_CENTRAL
  Filled 2020-03-17: qty 4

## 2020-03-17 MED ORDER — LIDOCAINE HCL (PF) 1 % IJ SOLN: 5.0000 mL | INTRAMUSCULAR | Status: DC | PRN

## 2020-03-17 MED ORDER — SODIUM CHLORIDE 0.9 % IV BOLUS
500.0000 mL | Freq: Once | INTRAVENOUS | Status: AC
  Administered 2020-03-17: 500 mL via INTRAVENOUS

## 2020-03-17 MED ORDER — VECURONIUM BROMIDE 10 MG IV SOLR
10.0000 mg | Freq: Once | INTRAVENOUS | Status: AC
  Administered 2020-03-19: 10 mg via INTRAVENOUS
  Filled 2020-03-17: qty 10

## 2020-03-17 NOTE — Progress Notes (Signed)
[facility] ROUNDING NOTE   Subjective:   Brief history: This a 49 year old lady with a history of end-stage renal disease Monday Wednesday Friday dialysis chronic hypoxic respiratory failure, she is on 3 L nasal cannula at baseline.  She has a history of diabetes hypertension hyperlipidemia hypothyroidism.  She was diagnosed with Covid pneumonia 02/09/2020.  She was admitted 02/13/2020.  She required intubation and CRRT on vasopressors 03/02/2020 - 10/7.  Last hemodialysis 03/15/2020 with 3 L removed next dialysis 03/17/2020  Blood pressure 105/61 pulse 6 6 temperature 99.1 O2 sats 98% FiO2 40%  Sodium 139 potassium 4.5 chloride 109 CO2 23 BUN 69 creatinine 6.62 glucose 219 calcium 9.6 phosphorus 7.7 albumin 1.1 hemoglobin 6.9  Eliquis 2.5 mg twice daily, insulin sliding scale, Levemir 24 units every 12 hours, levothyroxine 125 mcg daily, Protonix 40 mg twice daily, darbepoetin 150 mcg every Tuesday   IV Rocephin IV heparin       Objective:  Vital signs in last 24 hours:  Temp:  [98.6 F (37 C)-102.3 F (39.1 C)] 99.1 F (37.3 C) (10/16 0730) Pulse Rate:  [66-117] 66 (10/16 0730) Resp:  [25-43] 35 (10/16 0730) BP: (86-124)/(57-86) 105/61 (10/16 0730) SpO2:  [93 %-98 %] 98 % (10/16 0730) FiO2 (%):  [40 %] 40 % (10/16 0600) Weight:  [107.9 kg] 107.9 kg (10/16 0500)  Weight change: 1.5 kg Filed Weights   03/15/20 1129 03/16/20 0357 03/17/20 0500  Weight: 106.4 kg 108.7 kg 107.9 kg    Intake/Output: I/O last 3 completed shifts: In: 1442.3 [I.V.:737.3; NG/GT:605; IV Piggyback:100] Out: 70 [Stool:70]   Intake/Output this shift:  No intake/output data recorded.  Critically ill obese CVS- RRR without murmurs rubs or gallops RS- CTA ventilator dependent ETT tube ABD- BS present soft non-distended   EXT- no edema   Basic Metabolic Panel: Recent Labs  Lab 03/11/20 0407 03/11/20 0407 03/12/20 0403 03/13/20 1135 03/15/20 0716 03/17/20 0616  NA 137  --   138 135 139 139  K 4.2  --  5.1 6.4* 5.9* 4.5  CL 100  --  100  --  97* 99  CO2 25  --  24  --  25 23  GLUCOSE 194*  --  328*  --  113* 210*  BUN 49*  --  74*  --  79* 79*  CREATININE 3.99*  --  6.04*  --  6.60* 6.62*  CALCIUM 9.0   < > 9.1  --  9.7 9.6  PHOS 4.8*  --  5.7*  --  7.1* 7.7*   < > = values in this interval not displayed.    Liver Function Tests: Recent Labs  Lab 03/11/20 0407 03/12/20 0403 03/15/20 0716 03/17/20 0616  AST  --  19  --   --   ALT  --  37  --   --   ALKPHOS  --  281*  --   --   BILITOT  --  1.3*  --   --   PROT  --  5.7*  --   --   ALBUMIN 1.2* 1.1* 1.1* 1.1*   No results for input(s): LIPASE, AMYLASE in the last 168 hours. No results for input(s): AMMONIA in the last 168 hours.  CBC: Recent Labs  Lab 03/11/20 0211 03/12/20 0403 03/13/20 1135 03/15/20 0717 03/17/20 0616  WBC  --  23.1*  --  19.0* 22.7*  HGB 8.5* 8.2* 7.8* 7.2* 6.9*  HCT 25.0* 25.1* 23.0* 22.7* 21.9*  MCV  --  92.6  --  93.8 96.1  PLT  --  232  --  348 423*    Cardiac Enzymes: No results for input(s): CKTOTAL, CKMB, CKMBINDEX, TROPONINI in the last 168 hours.  BNP: Invalid input(s): POCBNP  CBG: Recent Labs  Lab 03/16/20 1636 03/16/20 1951 03/16/20 2112 03/16/20 2346 03/17/20 0322  GLUCAP 101* 89 54 83 124*    Microbiology: Results for orders placed or performed during the hospital encounter of 02/13/20  Culture, blood (Routine x 2)     Status: None   Collection Time: 02/13/20  7:02 PM   Specimen: BLOOD RIGHT HAND  Result Value Ref Range Status   Specimen Description BLOOD RIGHT HAND  Final   Special Requests   Final    BOTTLES DRAWN AEROBIC AND ANAEROBIC Blood Culture adequate volume   Culture   Final    NO GROWTH 5 DAYS Performed at [facility], [place] [address] [place], [place] [address]    Report Status 02/18/2020 FINAL  Final  SARS Coronavirus 2 by RT PCR (hospital order, performed in [facility] hospital lab) Nasopharyngeal  Nasopharyngeal Swab     Status: Abnormal   Collection Time: 02/14/20  8:33 AM   Specimen: Nasopharyngeal Swab  Result Value Ref Range Status   SARS Coronavirus 2 POSITIVE (A) NEGATIVE Final    Comment: RESULT CALLED TO, READ BACK BY AND VERIFIED WITH: RN T.SHORE AT 1110 ON 02/14/2020 BY T.SAAD (NOTE) SARS-CoV-2 target nucleic acids are DETECTED  SARS-CoV-2 RNA is generally detectable in upper respiratory specimens  during the acute phase of infection.  Positive results are indicative  of the presence of the identified virus, but do not rule out bacterial infection or co-infection with other pathogens not detected by the test.  Clinical correlation with patient history and  other diagnostic information is necessary to determine patient infection status.  The expected result is negative.  Fact Sheet for Patients:   StrictlyIdeas.no   Fact Sheet for Healthcare Providers:   BankingDealers.co.za    This test is not yet approved or cleared by the [place] FDA and  has been authorized for detection and/or diagnosis of SARS-CoV-2 by FDA under an Emergency Use Authorization (EUA).  This EUA will remain in effect (meaning  this test can be used) for the duration of  the COVID-19 declaration under Section 564(b)(1) of the Act, 21 U.S.C. section 360-bbb-3(b)(1), unless the authorization is terminated or revoked sooner.  Performed at [facility], [place] [address], [place], [place] [address]   Culture, blood (Routine x 2)     Status: None   Collection Time: 02/14/20 10:39 AM   Specimen: BLOOD RIGHT FOREARM  Result Value Ref Range Status   Specimen Description BLOOD RIGHT FOREARM  Final   Special Requests   Final    BOTTLES DRAWN AEROBIC AND ANAEROBIC Blood Culture adequate volume   Culture   Final    NO GROWTH 5 DAYS Performed at [facility], [place] [address] [place], [place] [address]    Report Status 02/19/2020 FINAL   Final  Culture, blood (Routine X 2) w Reflex to ID Panel     Status: None   Collection Time: 02/26/20  4:25 PM   Specimen: BLOOD RIGHT HAND  Result Value Ref Range Status   Specimen Description BLOOD RIGHT HAND  Final   Special Requests   Final    BOTTLES DRAWN AEROBIC AND ANAEROBIC Blood Culture adequate volume   Culture   Final  NO GROWTH 5 DAYS Performed at [facility], [place] [address] [place], [place] [address]    Report Status 03/02/2020 FINAL  Final  Culture, blood (Routine X 2) w Reflex to ID Panel     Status: None   Collection Time: 02/26/20  4:31 PM   Specimen: BLOOD RIGHT ARM  Result Value Ref Range Status   Specimen Description BLOOD RIGHT ARM  Final   Special Requests   Final    BOTTLES DRAWN AEROBIC ONLY Blood Culture results may not be optimal due to an inadequate volume of blood received in culture bottles   Culture   Final    NO GROWTH 5 DAYS Performed at [facility], [place] [address] [place], [place] [address]    Report Status 03/02/2020 FINAL  Final  MRSA PCR Screening     Status: None   Collection Time: 02/27/20  9:14 AM   Specimen: Nasopharyngeal  Result Value Ref Range Status   MRSA by PCR NEGATIVE NEGATIVE Final    Comment:        The GeneXpert MRSA Assay (FDA approved for NASAL specimens only), is one component of a comprehensive MRSA colonization surveillance program. It is not intended to diagnose MRSA infection nor to guide or monitor treatment for MRSA infections. Performed at [facility], [place] [address] [place], [place] [address]   Culture, respiratory (non-expectorated)     Status: None   Collection Time: 03/09/20  3:01 PM   Specimen: Tracheal Aspirate; Respiratory  Result Value Ref Range Status   Specimen Description TRACHEAL ASPIRATE  Final   Special Requests NONE  Final   Gram Stain   Final    ABUNDANT WBC PRESENT,BOTH PMN AND MONONUCLEAR ABUNDANT GRAM POSITIVE COCCI ABUNDANT GRAM NEGATIVE RODS FEW GRAM  VARIABLE ROD Performed at [facility], [place] [address], [place], [place] [address]    Culture ABUNDANT STAPHYLOCOCCUS AUREUS  Final   Report Status 03/11/2020 FINAL  Final   Organism ID, Bacteria STAPHYLOCOCCUS AUREUS  Final      Susceptibility   Staphylococcus aureus - MIC*    CIPROFLOXACIN <=0.5 SENSITIVE Sensitive     ERYTHROMYCIN >=8 RESISTANT Resistant     GENTAMICIN <=0.5 SENSITIVE Sensitive     OXACILLIN <=0.25 SENSITIVE Sensitive     TETRACYCLINE <=1 SENSITIVE Sensitive     VANCOMYCIN <=0.5 SENSITIVE Sensitive     TRIMETH/SULFA <=10 SENSITIVE Sensitive     CLINDAMYCIN RESISTANT Resistant     RIFAMPIN <=0.5 SENSITIVE Sensitive     Inducible Clindamycin POSITIVE Resistant     * ABUNDANT STAPHYLOCOCCUS AUREUS  Culture, blood (Routine X 2) w Reflex to ID Panel     Status: None   Collection Time: 03/09/20  3:15 PM   Specimen: BLOOD RIGHT HAND  Result Value Ref Range Status   Specimen Description BLOOD RIGHT HAND  Final   Special Requests   Final    BOTTLES DRAWN AEROBIC AND ANAEROBIC Blood Culture adequate volume   Culture   Final    NO GROWTH 5 DAYS Performed at [facility], [address], [place], [place] [address]    Report Status 03/14/2020 FINAL  Final  Culture, blood (Routine X 2) w Reflex to ID Panel     Status: None   Collection Time: 03/09/20  3:30 PM   Specimen: BLOOD RIGHT HAND  Result Value Ref Range Status   Specimen Description BLOOD RIGHT HAND  Final   Special Requests  Final    BOTTLES DRAWN AEROBIC AND ANAEROBIC Blood Culture adequate volume   Culture   Final    NO GROWTH 5 DAYS Performed at [facility], [place] [address], [place], [place] [address]    Report Status 03/14/2020 FINAL  Final    Coagulation Studies: No results for input(s): LABPROT, INR in the last 72 hours.  Urinalysis: No results for input(s): COLORURINE, LABSPEC, PHURINE, GLUCOSEU, HGBUR, BILIRUBINUR, KETONESUR, PROTEINUR, UROBILINOGEN, NITRITE, LEUKOCYTESUR in  the last 72 hours.  Invalid input(s): APPERANCEUR    Imaging: No results found.   Medications:   . sodium chloride Stopped (03/11/20 1509)  . sodium chloride    . sodium chloride    . sodium chloride    . cefTRIAXone (ROCEPHIN)  IV Stopped (03/16/20 1139)  . dexmedetomidine (PRECEDEX) IV infusion 0.8 mcg/kg/hr (03/17/20 0700)  . dextrose    . feeding supplement (NEPRO CARB STEADY) 55 mL/hr at 03/16/20 0224  . fentaNYL infusion INTRAVENOUS 150 mcg/hr (03/17/20 0700)   . heparin sodium (porcine)      . sodium chloride   Intravenous Once  . apixaban  2.5 mg Per Tube BID  . B-complex with vitamin C  1 tablet Per Tube Daily  . chlorhexidine gluconate (MEDLINE KIT)  15 mL Mouth Rinse BID  . Chlorhexidine Gluconate Cloth  6 each Topical Q0600  . Chlorhexidine Gluconate Cloth  6 each Topical Q0600  . Chlorhexidine Gluconate Cloth  6 each Topical Q0600  . Chlorhexidine Gluconate Cloth  6 each Topical Q0600  . darbepoetin (ARANESP) injection - DIALYSIS  150 mcg Intravenous Q Tue-HD  . docusate  100 mg Per Tube BID  . feeding supplement (PROSource TF)  45 mL Per Tube BID  . fiber  1 packet Per Tube BID  . insulin aspart  0-20 Units Subcutaneous Q4H  . insulin aspart  12 Units Subcutaneous Q4H  . insulin detemir  40 Units Subcutaneous Q12H  . levothyroxine  125 mcg Per Tube Q0600  . mouth rinse  15 mL Mouth Rinse 10 times per day  . pantoprazole sodium  40 mg Per Tube BID  . QUEtiapine  25 mg Per Tube BID  . sodium chloride flush  10-40 mL Intracatheter Q12H  . sodium chloride flush  3 mL Intravenous Q12H   sodium chloride, sodium chloride, sodium chloride, sodium chloride, acetaminophen (TYLENOL) oral liquid 160 mg/5 mL, albuterol, alteplase, dextrose, dextrose, fentaNYL, fentaNYL (SUBLIMAZE) injection, heparin, heparin, influenza vac split quadrivalent PF, lidocaine (PF), lidocaine-prilocaine, lip balm, pentafluoroprop-tetrafluoroeth, sodium chloride flush, sodium chloride flush    OP HD: TTS  4h 1mn 450/800  2/2.25 bath  111.5kg  Hep 2000  L AVF  - darbe 50 ug q week, last 9/7  - calc 1.0 tiw  - 9/9 Hb 10.0, tsat 22%  Assessment/ Plan:  # COVID pna w/ acute on chronic hypoxic respiratory failure:  dexamethasone, status post tocilizumab and remdesivir.  Severe CXR changes.    # Acute hypoxic resp failure  - extubated on 10/6 > reintubated on 10/8  #  Staph aureus VAP/ HCAP 03/09/20 - restarted on IV abx Rocephin  # ESRD:usual HD is TTS.  - sp CRRT 9/28- 10/7.  Next dialysis will be scheduled for 03/17/2020   # Clotted LUA AVF: note TDC placed by IR 9/11. Will need new perm access at some point when pt is more stable. Tunneled catheter exchanged on 9/27 with IR.   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 Pt back on vent. Ca and phos in range.   #DM2 - management per primary team    LOS: [place] _0 _1 :42 AM

## 2020-03-17 NOTE — Progress Notes (Signed)
CPT held at this time as pt is getting HD

## 2020-03-17 NOTE — Progress Notes (Signed)
NAME:  Wanda Ramirez, MRN:  [id number], DOB:  Aug 11, 1970, LOS: 48 ADMISSION DATE:  02/13/2020, CONSULTATION DATE: 02/24/2020 REFERRING MD: Dr. He[place], CHIEF COMPLAINT: Increasing supplemental oxygen demand  Brief History   Wanda Ramirez is a 49 year old female with a past medical history significant for end-stage renal disease on hemodialysis MWF, chronic hypoxic respiratory failure on 3 L nasal cannula at baseline, sleep apnea, diabetes, hypertension, hyperlipidemia, hypothyroidism, and asthma who presented to the emergency department with complaints of worsening shortness of breath, dry cough, generalized body aches, and loose stools.  Patient was diagnosed 9/9 with Covid pneumonia.  Initially she was able to maintain outpatient status but when symptoms worsened 9/14 she presented to the emergency department.  Patient reported dyspnea and weakness worsened to the point where she was unable to ambulate effectively.  She also reports she was unable to maintain adequate oral intake.  Patient received the moderna vaccines in March and April of this year.  Patient presented initially febrile with a temperature of 102.9 and mildly tachypneic with all other vital signs within normal limits.  She was initially managed on 4 L nasal cannula.  Lab work significant for NA 130, K3.8, glucose 454, anion gap 21, lactic acid 1.8, LDH 296, ferritin 6690, CRP 13.3, and procalcitonin 10.51  On review of medical record it appears patient has progressively required increased supplemental oxygen requirement.  Afternoon of 9/24 patient had episode of desaturation requiring further increase in supplemental oxygen.  On chart assessment it appears patient has been placed on 40 L with reported increased work of breathing prompting PCCM consultation  Past Medical History  Sleep apnea Morbid obesity Hypothyroidism Hypertension GERD  End-stage renal disease on HD MWF Diabetes Asthma   Immunization History   Administered Date(s) Administered  . Influenza,inj,Quad PF,6+ Mos 02/09/2018     Significant Hospital Events   Admitted 9/14 9/24 - Lying in bed on side currently undergoing iHD,  She is lethargic but will arouse to verbal stimuli but quickly falls back to sleep.  RN states patient has had progressive decline over the last 12hrs 9/25 - -currently in medical ICU to [facility].  Bed 12.  She has been on BiPAP with Precedex overnight.  This morning on the low-dose of Precedex she got agitated.?  Also had melena.  She also desaturated.  Nursing then increase her Precedex and since then she has been more stable.  Yesterday dialysis was cut short.  9/26 -unable to run CRRT with prone ventilation.  Resume supine ventilation but still unable to draw blood back. 9/27 transferred to 76m. IR replaced HD catheter 10/1 on CRRT and vasopressors 10/6 - extubated 10/7 - ad some stridor and increased WOB so placed on BIPAP and dexamethasone given.  10/15 weaning sedation. midaz off Consults:  PCCM  Procedures:  9/23 IR guided tunneled HD cath placement - left subclavian ?  Date right radial art line ?  Date right IJ 10/6 extubated 10/ 8 -reintubated.  Febrile to 103.  Started on cultures and antibiotics. 10/9 -  on vent 40%. Fent gtt, Precedex gtt, On levophed gtt - coming to nearly off. Fever coming down. RN reporting discolored mucus from lung. Tolerated HD despite perssors  Significant Diagnostic Tests:  Pulmonary VQ scan at 9/21 > negative  Micro Data:   03/09/2020 -tracheal aspirate -Staph MSSA 03/09/2020-b=BCx> NGTD   Antimicrobials:  Cefepime 9/24 > Azithromycin 9/21 > 9/26 Remdesivir completed 9/17 > 9/19 Tocilizumab x1  Vancomycin 03/09/2020-03/11/2020 Zosyn 03/09/2020-03/11/2020 Ceftriaxone 03/11/2020 >>  Interim history/subjective:  Tolerating PSV however remains sedated despite interruption of Versed and fentanyl.  Continues to have copious secretions.  Objective   Blood  pressure 103/64, pulse 97, temperature (!) 101.9 F (38.8 C), temperature source Oral, resp. rate (!) 33, height 5\' 2"  (1.575 m), weight 106 kg, SpO2 96 %.    Vent Mode: CPAP;PSV FiO2 (%):  [40 %] 40 % Set Rate:  [35 bmp] 35 bmp Vt Set:  [400 mL] 400 mL PEEP:  [8 cmH20] 8 cmH20 Pressure Support:  [10 cmH20] 10 cmH20 Plateau Pressure:  [31 cmH20] 31 cmH20   Intake/Output Summary (Last 24 hours) at 03/17/2020 1648 Last data filed at 03/17/2020 1600 Gross per 24 hour  Intake 1624.1 ml  Output 1974 ml  Net -349.9 ml   Filed Weights   03/17/20 0500 03/17/20 0730 03/17/20 1146  Weight: 107.9 kg 107.9 kg 106 kg   General Appearance:  Critically and chronically ill appearing middle aged F, reclined in bed intubated sedated NAD HEENT: NCAT cortrak and ETT secure. Anicteric sclera Lungs:  Scattered rhonchi. Thick tan secretions with pink tinge. No accessory use on PSV/CPAP  CV: RRR s1s2 no rgm cap refill brisk 2+ peripheral pulse Abdomen: obese, soft, ndnt + bowel sounds GU:  wnl Extremities:  1+ pitting edema. No obvious joint deformity no cyanosis or clubbing  Skin:  C/d/w.  Neurologic:  Opens eyes to stimulation does not follow commands. [facility] Problem list   Mixed metabolic/respiratory acidosis  Shock, undifferentiated, improved Patient's echocardiogram shows RV overload.  Could be RV stunning from PVR increase due to COVID +/- septic effect of COVID, regardless, improved.  Assessment & Plan:   Acute hypoxic/hypercapnic respiratory failure due to ARDS secondary to COVID 19 Pneumonia Patient has completed steroid, remdesivir, immunomodulating therapy. Extubated 10/6 MSSA VAP Reintubated 10/8 P -Cont MV support -Rocephin -VAP -PAD -goal WUA/SBT qAM  -We will consent family today for tracheostomy  MSSA VAP/HCAP  -again febrile 10/15 Plan -7d course of rocephin  -trend CBC T/R/Sa  -PRN APAP  Encephalopathy -suspect component of ICU delirium at this  point Sedation for mechanical ventilation -fent and midaz gtts weaned in half 10/14-10/15 P - Dc midaz gtt, incr dexmet gtt. Adding low dose enteral seroquel. Continue to wean fent gtt, will lower ceiling  -RASS goal 0 to -1  -next move would likely be adding enteral analgesia vs incr seroquel -delirium precautions    ESRD on HD  - s/p CRRT ending 03/08/20 Hyperkalemia   - iHD per renal  Thrombocytopenia, at risk VTE- - eliquis 2.5mg  BID ( as laid out 03/08/20 by Dr Tamala Julian PCCM) x 56mo   Acute on chronic Anemia likely component of chronic disease, critical illness, iatrogenic blood loss via fq labs -hgb 7.2 on 10/14 from 8.2 10/11 P -hgb goal > 7  -CBC T/R/Sa (Hd days)  Poorly controlled diabetes with hyperglycemia, A1c 12.8% on admission -Basal + SSI + q4 EN coverage  Hypothyroidism -synthroid  Best practice:  Diet: EN  Pain/Anxiety/Delirium protocol (if indicated): Fent midaz  VAP protocol (if indicated): yes DVT prophylaxis: see above GI prophylaxis: PPI Glucose control: Basal + SSI + q4 EN coverage  Mobility:  Bed rest Code Status: Full Family Communication:  Daughter Renea Schoonmaker (the one RN says MD should commuicate with) Long discussion 10/14, see documentation. Pending 10/15  Disposition:  ICU -- can leave COVID-19 ICU for non-isolation MICU.    LABS    PULMONARY Recent Labs  Lab 03/11/20 0211  03/13/20 1135  PHART 7.421 7.327*  PCO2ART 42.0 47.5  PO2ART 108 122*  HCO3 27.2 24.7  TCO2 28 26  O2SAT 98.0 98.0    CBC Recent Labs  Lab 03/12/20 0403 03/12/20 0403 03/13/20 1135 03/15/20 0717 03/17/20 0616  HGB 8.2*   < > 7.8* 7.2* 6.9*  HCT 25.1*   < > 23.0* 22.7* 21.9*  WBC 23.1*  --   --  19.0* 22.7*  PLT 232  --   --  348 423*   < > = values in this interval not displayed.    COAGULATION No results for input(s): INR in the last 168 hours.  CARDIAC  No results for input(s): TROPONINI in the last 168 hours. No results for input(s): PROBNP  in the last 168 hours.   CHEMISTRY Recent Labs  Lab 03/11/20 0407 03/11/20 0407 03/12/20 0403 03/12/20 0403 03/13/20 1135 03/13/20 1135 03/15/20 0716 03/17/20 0616  NA 137  --  138  --  135  --  139 139  K 4.2   < > 5.1   < > 6.4*   < > 5.9* 4.5  CL 100  --  100  --   --   --  97* 99  CO2 25  --  24  --   --   --  25 23  GLUCOSE 194*  --  328*  --   --   --  113* 210*  BUN 49*  --  74*  --   --   --  79* 79*  CREATININE 3.99*  --  6.04*  --   --   --  6.60* 6.62*  CALCIUM 9.0  --  9.1  --   --   --  9.7 9.6  PHOS 4.8*  --  5.7*  --   --   --  7.1* 7.7*   < > = values in this interval not displayed.   Estimated Creatinine Clearance: 11.8 mL/min (A) (by C-G formula based on SCr of 6.62 mg/dL (H)).   LIVER Recent Labs  Lab 03/11/20 0407 03/12/20 0403 03/15/20 0716 03/17/20 0616  AST  --  19  --   --   ALT  --  37  --   --   ALKPHOS  --  281*  --   --   BILITOT  --  1.3*  --   --   PROT  --  5.7*  --   --   ALBUMIN 1.2* 1.1* 1.1* 1.1*     INFECTIOUS Recent Labs  Lab 03/11/20 0407  PROCALCITON 10.82     ENDOCRINE CBG (last 3)  Recent Labs    03/17/20 0754 03/17/20 1155 03/17/20 1522  GLUCAP 184* 161* 325*   CRITICAL CARE Performed by: Kipp Brood   Total critical care time: 40 minutes  Critical care time was exclusive of separately billable procedures and treating other patients. Critical care was necessary to treat or prevent imminent or life-threatening deterioration.  Critical care was time spent personally by me on the following activities: development of treatment plan with patient and/or surrogate as well as nursing, discussions with consultants, evaluation of patient's response to treatment, examination of patient, obtaining history from patient or surrogate, ordering and performing treatments and interventions, ordering and review of laboratory studies, ordering and review of radiographic studies, pulse oximetry and re-evaluation of patient's  condition.  Kipp Brood, MD [facility] ICU Physician [place]  Pager: [phone] Mobile: [phone] After hours: [phone].  03/17/2020,  4:49 PM

## 2020-03-18 DIAGNOSIS — J80 Acute respiratory distress syndrome: Secondary | ICD-10-CM | POA: Diagnosis not present

## 2020-03-18 DIAGNOSIS — J9601 Acute respiratory failure with hypoxia: Secondary | ICD-10-CM | POA: Diagnosis not present

## 2020-03-18 DIAGNOSIS — U071 COVID-19: Secondary | ICD-10-CM | POA: Diagnosis not present

## 2020-03-18 LAB — GLUCOSE, CAPILLARY
Glucose-Capillary: 108 mg/dL — ABNORMAL HIGH (ref 70–99)
Glucose-Capillary: 111 mg/dL — ABNORMAL HIGH (ref 70–99)
Glucose-Capillary: 129 mg/dL — ABNORMAL HIGH (ref 70–99)
Glucose-Capillary: 135 mg/dL — ABNORMAL HIGH (ref 70–99)
Glucose-Capillary: 137 mg/dL — ABNORMAL HIGH (ref 70–99)
Glucose-Capillary: 164 mg/dL — ABNORMAL HIGH (ref 70–99)
Glucose-Capillary: 31 mg/dL — CL (ref 70–99)

## 2020-03-18 LAB — TYPE AND SCREEN
ABO/RH(D): O POS
Antibody Screen: NEGATIVE
Unit division: 0

## 2020-03-18 LAB — BPAM RBC
Blood Product Expiration Date: 202111162359
ISSUE DATE / TIME: 202110160900
Unit Type and Rh: 5100

## 2020-03-18 MED ORDER — DEXTROSE 10 % IV SOLN: INTRAVENOUS | Status: DC | PRN

## 2020-03-18 MED ORDER — DEXMEDETOMIDINE HCL IN NACL 400 MCG/100ML IV SOLN
0.0000 ug/kg/h | INTRAVENOUS | Status: DC
  Administered 2020-03-18 – 2020-03-20 (×8): 0.9 ug/kg/h via INTRAVENOUS
  Administered 2020-03-20 (×2): 1.2 ug/kg/h via INTRAVENOUS
  Administered 2020-03-20 (×2): 0.9 ug/kg/h via INTRAVENOUS
  Administered 2020-03-21 (×3): 1.2 ug/kg/h via INTRAVENOUS
  Filled 2020-03-18 (×3): qty 100
  Filled 2020-03-18: qty 200
  Filled 2020-03-18 (×4): qty 100
  Filled 2020-03-18: qty 200
  Filled 2020-03-18 (×5): qty 100

## 2020-03-18 NOTE — Progress Notes (Signed)
NAME:  Wanda Ramirez, MRN:  [id number], DOB:  Apr 06, 1971, LOS: 69 ADMISSION DATE:  02/13/2020, CONSULTATION DATE:  9/24 REFERRING MD:  Heber [place], CHIEF COMPLAINT:  Dyspnea   Brief History   49 yo female admitted with COVID 19 pneumonia on 9/13, had been diagnosed with COVID on 9/9.  On 9/24 her condition worsened and PCCM was consulted, she was sent to the ICU and treated with BIPAP, then eventually intubated.  Started on CRRT through a tunneled HD cath. Extubated on 10/6 but re-intubated on 10/8.  Has been challenged by sedation needs since admission.   Past Medical History  Sleep apnea Morbid obesity Hypothyroidism Hypertension GERD  End-stage renal disease on HD MWF Diabetes Asthma  Significant Hospital Events   Admitted 9/14 9/24 - Lying in bed on side currently undergoing iHD,  She is lethargic but will arouse to verbal stimuli but quickly falls back to sleep.  RN states patient has had progressive decline over the last 12hrs 9/25 - -currently in medical ICU to [facility].  Bed 12.  She has been on BiPAP with Precedex overnight.  This morning on the low-dose of Precedex she got agitated.?  Also had melena.  She also desaturated.  Nursing then increase her Precedex and since then she has been more stable.  Yesterday dialysis was cut short.  9/26 -unable to run CRRT with prone ventilation.  Resume supine ventilation but still unable to draw blood back. 9/27 transferred to 69m. IR replaced HD catheter 10/1 on CRRT and vasopressors 10/6 - extubated 10/7 - ad some stridor and increased WOB so placed on BIPAP and dexamethasone given.  10/15 weaning sedation. midaz off 10/16 move to 38M  Consults:  PCCM Nephrology  Procedures:  9/23 tunneled HD cath placement >  10/8 ETT >   Significant Diagnostic Tests:  9/21 Vas ultrasound> thrombosed LUE fistula 9/21 VQ scan > negative for PE 9/23 echo> LVEF 60-65%, flat ventrcile consistent with RV pressure overload, RV severely enlarged,  moderate reduction in RV function  Micro Data:  9/13 blood > neg   9/14 sars cov 2 > pos 9/26 blood > neg 10/8 resp > MSSA 10/8 blood >   Antimicrobials:  9/14 remdesivir > 9/19 9/15 tocilizumab > x1 9/20 cefepime > 9/29 9/20 azithro >  9/25 10/8 vanc > 10/9 10/8 zosyn > x1 10/10 ceftriaxone > 10/16  Interim history/subjective:   No acute events Moved down to 38M yesterday  Objective   Blood pressure (!) 146/76, pulse 81, temperature 98.9 F (37.2 C), temperature source Axillary, resp. rate (!) 33, height 5\' 2"  (1.575 m), weight 105.1 kg, SpO2 98 %.    Vent Mode: PRVC FiO2 (%):  [40 %] 40 % Set Rate:  [35 bmp] 35 bmp Vt Set:  [400 mL] 400 mL PEEP:  [8 cmH20] 8 cmH20 Pressure Support:  [10 cmH20] 10 cmH20 Plateau Pressure:  [24 cmH20-31 cmH20] 24 cmH20   Intake/Output Summary (Last 24 hours) at 03/18/2020 0732 Last data filed at 03/18/2020 0600 Gross per 24 hour  Intake 2808.04 ml  Output 2349 ml  Net 459.04 ml   Filed Weights   03/17/20 0730 03/17/20 1146 03/18/20 0600  Weight: 107.9 kg 106 kg 105.1 kg    Examination:  General:  In bed on vent HENT: NCAT ETT in place PULM: CTA B, vent supported breathing CV: RRR, no mgr GI: BS+, soft, nontender MSK: normal bulk and tone Neuro: sedated on vent   Resolved Hospital Problem list  Assessment & Plan:  COVID ARDS> slow improvement, has significant lung damage, mostly intubated due to inability to protect airway at thispoint, sedation issues Full mechanical vent support VAP prevention Daily WUA/SBT Pressure support as long as tolerated Tracheostomy tomorrow, hold eliquis  MSSA pneumonia Ceftriaxone off as of 10/16, monitor  ESRD HD per renal  Need for sedation for mechanical ventilation PAD protocol> wean off fentanyl/precedex   Thrombocytopenia: Monitor for bleeding Transfuse PRBC for Hgb < 7 gm/dL  Hypothyroidism syndhroid  DM2 with hyperglycemia Basal insulin  SSI Tube feeding reg  insulin  Best practice:  Diet: tube feeding Pain/Anxiety/Delirium protocol (if indicated): as above VAP protocol (if indicated): yes DVT prophylaxis: has been on eliquis, now on hold for tracheostomy, consider sub q heparin 10/18 GI prophylaxis: Pantoprazole for stress ulcer prophylaxis Glucose control: SSI Mobility: bed rest Code Status: full Family Communication: none bedside, called Tiona on 10/17, gave her an update Disposition: remain in ICU  Labs   CBC: Recent Labs  Lab 03/12/20 0403 03/13/20 1135 03/15/20 0717 03/17/20 0616 03/17/20 1820  WBC 23.1*  --  19.0* 22.7* 22.1*  HGB 8.2* 7.8* 7.2* 6.9* 8.0*  HCT 25.1* 23.0* 22.7* 21.9* 25.2*  MCV 92.6  --  93.8 96.1 94.4  PLT 232  --  348 423* 405*    Basic Metabolic Panel: Recent Labs  Lab 03/12/20 0403 03/13/20 1135 03/15/20 0716 03/17/20 0616 03/17/20 1820  NA 138 135 139 139 134*  K 5.1 6.4* 5.9* 4.5 3.8  CL 100  --  97* 99 94*  CO2 24  --  25 23 26   GLUCOSE 328*  --  113* 210* 173*  BUN 74*  --  79* 79* 36*  CREATININE 6.04*  --  6.60* 6.62* 3.49*  CALCIUM 9.1  --  9.7 9.6 8.9  PHOS 5.7*  --  7.1* 7.7* 4.6   GFR: Estimated Creatinine Clearance: 22.2 mL/min (A) (by C-G formula based on SCr of 3.49 mg/dL (H)). Recent Labs  Lab 03/12/20 0403 03/15/20 0717 03/17/20 0616 03/17/20 1820  WBC 23.1* 19.0* 22.7* 22.1*    Liver Function Tests: Recent Labs  Lab 03/12/20 0403 03/15/20 0716 03/17/20 0616 03/17/20 1820  AST 19  --   --   --   ALT 37  --   --   --   ALKPHOS 281*  --   --   --   BILITOT 1.3*  --   --   --   PROT 5.7*  --   --   --   ALBUMIN 1.1* 1.1* 1.1* 1.2*   No results for input(s): LIPASE, AMYLASE in the last 168 hours. No results for input(s): AMMONIA in the last 168 hours.  ABG    Component Value Date/Time   PHART 7.327 (L) 03/13/2020 1135   PCO2ART 47.5 03/13/2020 1135   PO2ART 122 (H) 03/13/2020 1135   HCO3 24.7 03/13/2020 1135   TCO2 26 03/13/2020 1135   ACIDBASEDEF  1.0 03/13/2020 1135   O2SAT 98.0 03/13/2020 1135     Coagulation Profile: No results for input(s): INR, PROTIME in the last 168 hours.  Cardiac Enzymes: No results for input(s): CKTOTAL, CKMB, CKMBINDEX, TROPONINI in the last 168 hours.  HbA1C: Hgb A1c MFr Bld  Date/Time Value Ref Range Status  02/14/2020 12:16 PM 12.8 (H) 4.8 - 5.6 % Final    Comment:    (NOTE) Pre diabetes:          5.7%-6.4%  Diabetes:              >  6.4%  Glycemic control for   <7.0% adults with diabetes   02/08/2018 11:42 AM 9.5 (H) 4.8 - 5.6 % Final    Comment:    (NOTE)         Prediabetes: 5.7 - 6.4         Diabetes: >6.4         Glycemic control for adults with diabetes: <7.0     CBG: Recent Labs  Lab 03/17/20 1522 03/17/20 1941 03/17/20 2351 03/18/20 0352 03/18/20 0440  GLUCAP 325* 123* 82 31* 111*     Critical care time: 35 minutes    Roselie Awkward, MD [facility] Pager: [phone] Cell: [phone] If no response, call [phone]

## 2020-03-18 NOTE — Progress Notes (Signed)
[facility] ROUNDING NOTE   Subjective:   Brief history: This a 49 year old lady with a history of end-stage renal disease TTS dialysis chronic hypoxic respiratory failure, she is on 3 L nasal cannula at baseline.  She has a history of diabetes hypertension hyperlipidemia hypothyroidism.  She was diagnosed with Covid pneumonia 02/09/2020.  She was admitted 02/13/2020.  She required intubation and CRRT on vasopressors 03/02/2020 - 10/7.  Last hemodialysis 03/17/2020 with 1.974 L removed.  The next dialysis treatment will be 03/20/2020  Blood pressure 130/70 pulse 70 temperature 98.9 O2 sats 97% FiO2 40%  Sodium 134 potassium 3.8 chloride 94 CO2 26 BUN 36 creatinine 3.49 glucose 173 calcium 8.9 phosphorus 4.6 albumin 1.2 hemoglobin 8.0  Eliquis 2.5 mg twice daily, insulin sliding scale, Levemir 24 units every 12 hours, levothyroxine 125 mcg daily, Protonix 40 mg twice daily, darbepoetin 150 mcg every Tuesday   IV Rocephin IV heparin       Objective:  Vital signs in last 24 hours:  Temp:  [97.8 F (36.6 C)-102.9 F (39.4 C)] 98.9 F (37.2 C) (10/17 0357) Pulse Rate:  [63-119] 81 (10/17 0645) Resp:  [20-66] 33 (10/17 0645) BP: (78-146)/(49-118) 146/76 (10/17 0645) SpO2:  [93 %-99 %] 98 % (10/17 0645) FiO2 (%):  [40 %] 40 % (10/17 0438) Weight:  [105.1 kg-107.9 kg] 105.1 kg (10/17 0600)  Weight change: 0 kg Filed Weights   03/17/20 0730 03/17/20 1146 03/18/20 0600  Weight: 107.9 kg 106 kg 105.1 kg    Intake/Output: I/O last 3 completed shifts: In: 3155 [I.V.:877.1; Blood:315; NG/GT:1580.9; IV Piggyback:382] Out: 2349 [Other:1974; Stool:375]   Intake/Output this shift:  No intake/output data recorded.  Critically ill obese CVS- RRR without murmurs rubs or gallops RS- CTA ventilator dependent ETT tube ABD- BS present soft non-distended   EXT- no edema   Basic Metabolic Panel: Recent Labs  Lab 03/12/20 0403 03/12/20 0403 03/13/20 1135 03/15/20 0716  03/17/20 0616 03/17/20 1820  NA 138  --  135 139 139 134*  K 5.1  --  6.4* 5.9* 4.5 3.8  CL 100  --   --  97* 99 94*  CO2 24  --   --  _0 GLUCOSE 328*  --   --  113* 210* 173*  BUN 74*  --   --  79* 79* 36*  CREATININE 6.04*  --   --  6.60* 6.62* 3.49*  CALCIUM 9.1   < >  --  9.7 9.6 8.9  PHOS 5.7*  --   --  7.1* 7.7* 4.6   < > = values in this interval not displayed.    Liver Function Tests: Recent Labs  Lab 03/12/20 0403 03/15/20 0716 03/17/20 0616 03/17/20 1820  AST 19  --   --   --   ALT 37  --   --   --   ALKPHOS 281*  --   --   --   BILITOT 1.3*  --   --   --   PROT 5.7*  --   --   --   ALBUMIN 1.1* 1.1* 1.1* 1.2*   No results for input(s): LIPASE, AMYLASE in the last 168 hours. No results for input(s): AMMONIA in the last 168 hours.  CBC: Recent Labs  Lab 03/12/20 0403 03/13/20 1135 03/15/20 0717 03/17/20 0616 03/17/20 1820  WBC 23.1*  --  19.0* 22.7* 22.1*  HGB 8.2* 7.8* 7.2* 6.9* 8.0*  HCT 25.1* 23.0* 22.7* 21.9* 25.2*  MCV  92.6  --  93.8 96.1 94.4  PLT 232  --  348 423* 405*    Cardiac Enzymes: No results for input(s): CKTOTAL, CKMB, CKMBINDEX, TROPONINI in the last 168 hours.  BNP: Invalid input(s): POCBNP  CBG: Recent Labs  Lab 03/17/20 1522 03/17/20 1941 03/17/20 2351 03/18/20 0352 03/18/20 0440  GLUCAP 325* 123* 82 31* 111*    Microbiology: Results for orders placed or performed during the hospital encounter of 02/13/20  Culture, blood (Routine x 2)     Status: None   Collection Time: 02/13/20  7:02 PM   Specimen: BLOOD RIGHT HAND  Result Value Ref Range Status   Specimen Description BLOOD RIGHT HAND  Final   Special Requests   Final    BOTTLES DRAWN AEROBIC AND ANAEROBIC Blood Culture adequate volume   Culture   Final    NO GROWTH 5 DAYS Performed at [facility], [place] [address], [place], [place] [address]    Report Status 02/18/2020 FINAL  Final  SARS Coronavirus 2 by RT PCR (hospital order, performed in [facility] hospital lab) Nasopharyngeal Nasopharyngeal Swab     Status: Abnormal   Collection Time: 02/14/20  8:33 AM   Specimen: Nasopharyngeal Swab  Result Value Ref Range Status   SARS Coronavirus 2 POSITIVE (A) NEGATIVE Final    Comment: RESULT CALLED TO, READ BACK BY AND VERIFIED WITH: RN T.SHORE AT 1110 ON 02/14/2020 BY T.SAAD (NOTE) SARS-CoV-2 target nucleic acids are DETECTED  SARS-CoV-2 RNA is generally detectable in upper respiratory specimens  during the acute phase of infection.  Positive results are indicative  of the presence of the identified virus, but do not rule out bacterial infection or co-infection with other pathogens not detected by the test.  Clinical correlation with patient history and  other diagnostic information is necessary to determine patient infection status.  The expected result is negative.  Fact Sheet for Patients:   StrictlyIdeas.no   Fact Sheet for Healthcare Providers:   BankingDealers.co.za    This test is not yet approved or cleared by the [place] FDA and  has been authorized for detection and/or diagnosis of SARS-CoV-2 by FDA under an Emergency Use Authorization (EUA).  This EUA will remain in effect (meaning  this test can be used) for the duration of  the COVID-19 declaration under Section 564(b)(1) of the Act, 21 U.S.C. section 360-bbb-3(b)(1), unless the authorization is terminated or revoked sooner.  Performed at [facility], [place] [address], [place], [place] [address]   Culture, blood (Routine x 2)     Status: None   Collection Time: 02/14/20 10:39 AM   Specimen: BLOOD RIGHT FOREARM  Result Value Ref Range Status   Specimen Description BLOOD RIGHT FOREARM  Final   Special Requests   Final    BOTTLES DRAWN AEROBIC AND ANAEROBIC Blood Culture adequate volume   Culture   Final    NO GROWTH 5 DAYS Performed at [facility], [place] [address] [place], [place] [address]     Report Status 02/19/2020 FINAL  Final  Culture, blood (Routine X 2) w Reflex to ID Panel     Status: None   Collection Time: 02/26/20  4:25 PM   Specimen: BLOOD RIGHT HAND  Result Value Ref Range Status   Specimen Description BLOOD RIGHT HAND  Final   Special Requests   Final    BOTTLES DRAWN AEROBIC AND ANAEROBIC Blood Culture adequate volume   Culture   Final  NO GROWTH 5 DAYS Performed at [facility], [place] [address], [place], [place] [address]    Report Status 03/02/2020 FINAL  Final  Culture, blood (Routine X 2) w Reflex to ID Panel     Status: None   Collection Time: 02/26/20  4:31 PM   Specimen: BLOOD RIGHT ARM  Result Value Ref Range Status   Specimen Description BLOOD RIGHT ARM  Final   Special Requests   Final    BOTTLES DRAWN AEROBIC ONLY Blood Culture results may not be optimal due to an inadequate volume of blood received in culture bottles   Culture   Final    NO GROWTH 5 DAYS Performed at [facility], [place] [address], [place], [place] [address]    Report Status 03/02/2020 FINAL  Final  MRSA PCR Screening     Status: None   Collection Time: 02/27/20  9:14 AM   Specimen: Nasopharyngeal  Result Value Ref Range Status   MRSA by PCR NEGATIVE NEGATIVE Final    Comment:        The GeneXpert MRSA Assay (FDA approved for NASAL specimens only), is one component of a comprehensive MRSA colonization surveillance program. It is not intended to diagnose MRSA infection nor to guide or monitor treatment for MRSA infections. Performed at [facility], [place] [address] [place], [place] [address]   Culture, respiratory (non-expectorated)     Status: None   Collection Time: 03/09/20  3:01 PM   Specimen: Tracheal Aspirate; Respiratory  Result Value Ref Range Status   Specimen Description TRACHEAL ASPIRATE  Final   Special Requests NONE  Final   Gram Stain   Final    ABUNDANT WBC PRESENT,BOTH PMN AND MONONUCLEAR ABUNDANT GRAM POSITIVE COCCI ABUNDANT GRAM  NEGATIVE RODS FEW GRAM VARIABLE ROD Performed at [facility], [place] [address] [place] [address] [place], [place] [address]    Culture ABUNDANT STAPHYLOCOCCUS AUREUS  Final   Report Status 03/11/2020 FINAL  Final   Organism ID, Bacteria STAPHYLOCOCCUS AUREUS  Final      Susceptibility   Staphylococcus aureus - MIC*    CIPROFLOXACIN <=0.5 SENSITIVE Sensitive     ERYTHROMYCIN >=8 RESISTANT Resistant     GENTAMICIN <=0.5 SENSITIVE Sensitive     OXACILLIN <=0.25 SENSITIVE Sensitive     TETRACYCLINE <=1 SENSITIVE Sensitive     VANCOMYCIN <=0.5 SENSITIVE Sensitive     TRIMETH/SULFA <=10 SENSITIVE Sensitive     CLINDAMYCIN RESISTANT Resistant     RIFAMPIN <=0.5 SENSITIVE Sensitive     Inducible Clindamycin POSITIVE Resistant     * ABUNDANT STAPHYLOCOCCUS AUREUS  Culture, blood (Routine X 2) w Reflex to ID Panel     Status: None   Collection Time: 03/09/20  3:15 PM   Specimen: BLOOD RIGHT HAND  Result Value Ref Range Status   Specimen Description BLOOD RIGHT HAND  Final   Special Requests   Final    BOTTLES DRAWN AEROBIC AND ANAEROBIC Blood Culture adequate volume   Culture   Final    NO GROWTH 5 DAYS Performed at [facility], [address], [place], [place] [address]    Report Status 03/14/2020 FINAL  Final  Culture, blood (Routine X 2) w Reflex to ID Panel     Status: None   Collection Time: 03/09/20  3:30 PM   Specimen: BLOOD RIGHT HAND  Result Value Ref Range Status   Specimen Description BLOOD RIGHT HAND  Final   Special Requests  Final    BOTTLES DRAWN AEROBIC AND ANAEROBIC Blood Culture adequate volume   Culture   Final    NO GROWTH 5 DAYS Performed at [facility], [place] [address], [place], [place] [address]    Report Status 03/14/2020 FINAL  Final    Coagulation Studies: No results for input(s): LABPROT, INR in the last 72 hours.  Urinalysis: No results for input(s): COLORURINE, LABSPEC, PHURINE, GLUCOSEU, HGBUR, BILIRUBINUR, KETONESUR, PROTEINUR, UROBILINOGEN,  NITRITE, LEUKOCYTESUR in the last 72 hours.  Invalid input(s): APPERANCEUR    Imaging: No results found.   Medications:   . sodium chloride Stopped (03/11/20 1509)  . sodium chloride    . sodium chloride    . sodium chloride    . sodium chloride    . dexmedetomidine (PRECEDEX) IV infusion 0.9 mcg/kg/hr (03/18/20 0600)  . dextrose    . feeding supplement (NEPRO CARB STEADY) 55 mL/hr at 03/17/20 1500  . fentaNYL infusion INTRAVENOUS 200 mcg/hr (03/18/20 0615)   . apixaban  2.5 mg Per Tube BID  . B-complex with vitamin C  1 tablet Per Tube Daily  . chlorhexidine gluconate (MEDLINE KIT)  15 mL Mouth Rinse BID  . Chlorhexidine Gluconate Cloth  6 each Topical Q0600  . Chlorhexidine Gluconate Cloth  6 each Topical Q0600  . Chlorhexidine Gluconate Cloth  6 each Topical Q0600  . Chlorhexidine Gluconate Cloth  6 each Topical Q0600  . darbepoetin (ARANESP) injection - DIALYSIS  150 mcg Intravenous Q Tue-HD  . docusate  100 mg Per Tube BID  . feeding supplement (PROSource TF)  45 mL Per Tube BID  . fentaNYL (SUBLIMAZE) injection  100 mcg Intravenous Once  . fiber  1 packet Per Tube BID  . insulin aspart  0-20 Units Subcutaneous Q4H  . insulin aspart  12 Units Subcutaneous Q4H  . insulin detemir  40 Units Subcutaneous Q12H  . levothyroxine  125 mcg Per Tube Q0600  . mouth rinse  15 mL Mouth Rinse 10 times per day  . midazolam  6 mg Intravenous Once  . pantoprazole sodium  40 mg Per Tube BID  . propofol  30 mg Intravenous Once  . QUEtiapine  25 mg Per Tube BID  . sodium chloride flush  10-40 mL Intracatheter Q12H  . sodium chloride flush  3 mL Intravenous Q12H  . vecuronium  10 mg Intravenous Once   sodium chloride, sodium chloride, sodium chloride, sodium chloride, sodium chloride, acetaminophen (TYLENOL) oral liquid 160 mg/5 mL, albuterol, alteplase, dextrose, dextrose, fentaNYL, fentaNYL (SUBLIMAZE) injection, heparin, heparin, influenza vac split quadrivalent PF, lidocaine (PF),  lidocaine-prilocaine, lip balm, pentafluoroprop-tetrafluoroeth, sodium chloride flush, sodium chloride flush   OP HD: TTS  4h 60mn 450/800  2/2.25 bath  111.5kg  Hep 2000  L AVF  - darbe 50 ug q week, last 9/7  - calc 1.0 tiw  - 9/9 Hb 10.0, tsat 22%  Assessment/ Plan:  # COVID pna w/ acute on chronic hypoxic respiratory failure:  dexamethasone, status post tocilizumab and remdesivir.  Severe CXR changes.    # Acute hypoxic resp failure  - extubated on 10/6 > reintubated on 10/8.  #  Staph aureus VAP/ HCAP 03/09/20 - restarted on IV abx Rocephin  # ESRD:usual HD is TTS.  - sp CRRT 9/28- 10/7.  Underwent successful dialysis with almost 2 L removed on 03/17/2020 next dialysis treatment be 03/20/2020   # Clotted LUA AVF: note TDC placed by IR 9/11. Will need new perm access at some point  when pt is more stable. Tunneled catheter exchanged on 9/27 with IR.   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 Pt back on vent. Ca and phos in range.   #DM2 - management per primary team    LOS: [place] _0 _1 :23 AM

## 2020-03-18 NOTE — Progress Notes (Signed)
Assisted tele visit to patient with daughter.  Alver Sorrow, RN

## 2020-03-18 NOTE — Progress Notes (Signed)
Assisted daughter with tele-visit via elink

## 2020-03-19 ENCOUNTER — Inpatient Hospital Stay (HOSPITAL_COMMUNITY): Payer: Medicaid Other

## 2020-03-19 DIAGNOSIS — J80 Acute respiratory distress syndrome: Secondary | ICD-10-CM | POA: Diagnosis not present

## 2020-03-19 DIAGNOSIS — J159 Unspecified bacterial pneumonia: Secondary | ICD-10-CM | POA: Diagnosis not present

## 2020-03-19 DIAGNOSIS — U071 COVID-19: Secondary | ICD-10-CM | POA: Diagnosis not present

## 2020-03-19 DIAGNOSIS — J9601 Acute respiratory failure with hypoxia: Secondary | ICD-10-CM | POA: Diagnosis not present

## 2020-03-19 DIAGNOSIS — G9340 Encephalopathy, unspecified: Secondary | ICD-10-CM | POA: Diagnosis not present

## 2020-03-19 LAB — COMPREHENSIVE METABOLIC PANEL
ALT: 79 U/L — ABNORMAL HIGH (ref 0–44)
AST: 62 U/L — ABNORMAL HIGH (ref 15–41)
Albumin: 1.3 g/dL — ABNORMAL LOW (ref 3.5–5.0)
Alkaline Phosphatase: 523 U/L — ABNORMAL HIGH (ref 38–126)
Anion gap: 19 — ABNORMAL HIGH (ref 5–15)
BUN: 77 mg/dL — ABNORMAL HIGH (ref 6–20)
CO2: 21 mmol/L — ABNORMAL LOW (ref 22–32)
Calcium: 9.7 mg/dL (ref 8.9–10.3)
Chloride: 96 mmol/L — ABNORMAL LOW (ref 98–111)
Creatinine, Ser: 6.01 mg/dL — ABNORMAL HIGH (ref 0.44–1.00)
GFR, Estimated: 8 mL/min — ABNORMAL LOW (ref >60)
Glucose, Bld: 193 mg/dL — ABNORMAL HIGH (ref 70–99)
Potassium: 3.7 mmol/L (ref 3.5–5.1)
Sodium: 136 mmol/L (ref 135–145)
Total Bilirubin: 0.6 mg/dL (ref 0.3–1.2)
Total Protein: 6.4 g/dL — ABNORMAL LOW (ref 6.5–8.1)

## 2020-03-19 LAB — CBC WITH DIFFERENTIAL/PLATELET
Abs Immature Granulocytes: 0.46 10*3/uL — ABNORMAL HIGH (ref 0.00–0.07)
Basophils Absolute: 0.1 10*3/uL (ref 0.0–0.1)
Basophils Relative: 1 %
Eosinophils Absolute: 0.3 10*3/uL (ref 0.0–0.5)
Eosinophils Relative: 1 %
HCT: 24.9 % — ABNORMAL LOW (ref 36.0–46.0)
Hemoglobin: 8 g/dL — ABNORMAL LOW (ref 12.0–15.0)
Immature Granulocytes: 2 %
Lymphocytes Relative: 9 %
Lymphs Abs: 2 10*3/uL (ref 0.7–4.0)
MCH: 30.5 pg (ref 26.0–34.0)
MCHC: 32.1 g/dL (ref 30.0–36.0)
MCV: 95 fL (ref 80.0–100.0)
Monocytes Absolute: 1.2 10*3/uL — ABNORMAL HIGH (ref 0.1–1.0)
Monocytes Relative: 5 %
Neutro Abs: 17.7 10*3/uL — ABNORMAL HIGH (ref 1.7–7.7)
Neutrophils Relative %: 82 %
Platelets: 459 10*3/uL — ABNORMAL HIGH (ref 150–400)
RBC: 2.62 MIL/uL — ABNORMAL LOW (ref 3.87–5.11)
RDW: 18.2 % — ABNORMAL HIGH (ref 11.5–15.5)
WBC: 21.7 10*3/uL — ABNORMAL HIGH (ref 4.0–10.5)
nRBC: 0 % (ref 0.0–0.2)

## 2020-03-19 LAB — GLUCOSE, CAPILLARY
Glucose-Capillary: 175 mg/dL — ABNORMAL HIGH (ref 70–99)
Glucose-Capillary: 147 mg/dL — ABNORMAL HIGH (ref 70–99)
Glucose-Capillary: 169 mg/dL — ABNORMAL HIGH (ref 70–99)
Glucose-Capillary: 180 mg/dL — ABNORMAL HIGH (ref 70–99)
Glucose-Capillary: 93 mg/dL (ref 70–99)
Glucose-Capillary: 294 mg/dL — ABNORMAL HIGH (ref 70–99)

## 2020-03-19 MED ORDER — VECURONIUM BROMIDE 10 MG IV SOLR
5.0000 mg | Freq: Once | INTRAVENOUS | Status: AC
  Administered 2020-03-19: 5 mg via INTRAVENOUS

## 2020-03-19 MED ORDER — VECURONIUM BROMIDE 10 MG IV SOLR
INTRAVENOUS | Status: AC
  Filled 2020-03-19: qty 10

## 2020-03-19 MED ORDER — PROPOFOL 10 MG/ML IV BOLUS
90.0000 mg | Freq: Once | INTRAVENOUS | Status: AC
  Administered 2020-03-19: 90 mg via INTRAVENOUS

## 2020-03-19 MED ORDER — OXYCODONE HCL 5 MG/5ML PO SOLN
10.0000 mg | Freq: Four times a day (QID) | ORAL | Status: DC
  Administered 2020-03-19 – 2020-04-03 (×58): 10 mg
  Filled 2020-03-19 (×58): qty 10

## 2020-03-19 NOTE — Progress Notes (Signed)
[facility] Progress Note  Subjective: I/O yest 2.1 in and 600 cc out.  wts today up to 111kg.   Vitals:   03/19/20 1100 03/19/20 1200 03/19/20 1437 03/19/20 1537  BP: 125/74 123/73    Pulse: 81 72    Resp: 19 (!) 35    Temp: 98.9 F (37.2 C)     TempSrc: Axillary     SpO2: 97% 98% 100% 100%  Weight:      Height:        Exam: Critically ill obese CVS- RRR without murmurs rubs or gallops RS- CTA ventilator dependent ETT tube ABD- BS present soft non-distended   EXT- no edema    OP HD: TTS 4h 10mn 450/800 2/2.25 bath 111.5kg Hep 2000 L AVF - darbe 50 ug q week, last 9/7 - calc 1.0 tiw - 9/9 Hb 10.0, tsat 22%   Summary: 49yo female w/ ESRD on TTS HD admitted for COVID PNA on 02/13/20. She was intubated. She had CRRT around 9/26- 10/7.  Now is back on intermittent HD. Last HD was 10/16. She will get a trach today d/t prolonged ventilation. She had MSSA pna. DM2.   Assessment/ Plan: 1. COVID pna - for trach today, on vent.  2. MSSA HCAP/ VAP - per ccm 3. ESRD - sp CRRT 9/26- 10/7. Now back on iHD. HD tomorrow 4. BP/ volume - no pressors or BP lowering meds. Under dry wt but not by much. Small UF w/ HD tomorrow.  5. HD access: LUA AVF found to be clotted here, new TDC placed by IR On 9/11, and then exchanged again on 9/27 with IR. Will need new perm access at some point when pt is more stable.  6. 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 DM2 - per pmd     Rob Chassity Ludke 03/19/2020, 4:05 PM   Recent Labs  Lab 03/17/20 0616 03/17/20 0616 03/17/20 1820 03/19/20 0345  K 4.5   < > 3.8 3.7  BUN 79*   < > 36* 77*  CREATININE 6.62*   < > 3.49* 6.01*  CALCIUM 9.6   < > 8.9 9.7  PHOS 7.7*  --  4.6  --   HGB 6.9*   < > 8.0* 8.0*   < > = values in this interval not displayed.   Inpatient medications: . B-complex with vitamin C  1 tablet Per Tube Daily  .  chlorhexidine gluconate (MEDLINE KIT)  15 mL Mouth Rinse BID  . Chlorhexidine Gluconate Cloth  6 each Topical Q0600  . darbepoetin (ARANESP) injection - DIALYSIS  150 mcg Intravenous Q Tue-HD  . docusate  100 mg Per Tube BID  . feeding supplement (PROSource TF)  45 mL Per Tube BID  . fentaNYL (SUBLIMAZE) injection  100 mcg Intravenous Once  . fiber  1 packet Per Tube BID  . insulin aspart  0-20 Units Subcutaneous Q4H  . levothyroxine  125 mcg Per Tube Q0600  . mouth rinse  15 mL Mouth Rinse 10 times per day  . midazolam  6 mg Intravenous Once  . pantoprazole sodium  40 mg Per Tube BID  . propofol  30 mg Intravenous Once  . propofol  90 mg Intravenous Once  . QUEtiapine  25 mg Per Tube BID  . sodium chloride flush  10-40 mL Intracatheter Q12H  . sodium chloride flush  3 mL Intravenous Q12H  .  vecuronium      . vecuronium  10 mg Intravenous Once  . vecuronium  5 mg Intravenous Once   . sodium chloride Stopped (03/11/20 1509)  . sodium chloride    . sodium chloride    . sodium chloride    . dexmedetomidine (PRECEDEX) IV infusion 0.9 mcg/kg/hr (03/19/20 1210)  . dextrose    . feeding supplement (NEPRO CARB STEADY) Stopped (03/19/20 0000)  . fentaNYL infusion INTRAVENOUS 300 mcg/hr (03/19/20 1333)   sodium chloride, sodium chloride, sodium chloride, sodium chloride, acetaminophen (TYLENOL) oral liquid 160 mg/5 mL, albuterol, alteplase, dextrose, dextrose, fentaNYL, fentaNYL (SUBLIMAZE) injection, heparin, heparin, influenza vac split quadrivalent PF, lidocaine (PF), lidocaine-prilocaine, lip balm, pentafluoroprop-tetrafluoroeth, sodium chloride flush, sodium chloride flush

## 2020-03-19 NOTE — Procedures (Signed)
Percutaneous Tracheostomy Procedure Note   Wanda Ramirez  [id number]  1970/09/25  Date:03/19/20  Time:3:59 PM   Provider Performing:Pete E Kary Kos  Procedure: Percutaneous Tracheostomy with Bronchoscopic Guidance ([id number])  Indication(s) Vent weaning   Consent Risks of the procedure as well as the alternatives and risks of each were explained to the patient and/or caregiver.  Consent for the procedure was obtained.  Anesthesia Etomidate, Versed, Fentanyl, Vecuronium   Time Out Verified patient identification, verified procedure, site/side was marked, verified correct patient position, special equipment/implants available, medications/allergies/relevant history reviewed, required imaging and test results available.   Sterile Technique Maximal sterile technique including sterile barrier drape, hand hygiene, sterile gown, sterile gloves, mask, hair covering.    Procedure Description Appropriate anatomy identified by palpation.  Patient's neck prepped and draped in sterile fashion.  1% lidocaine with epinephrine was used to anesthetize skin overlying neck.  1.5cm incision made and blunt dissection performed until tracheal rings could be easily palpated.   Then a size 6 proximal XLT Shiley tracheostomy was placed under bronchoscopic visualization using usual Seldinger technique and serial dilation.   Bronchoscope confirmed placement above the carina.  Tracheostomy was sutured in place with adhesive pad to protect skin under pressure.    Patient connected to ventilator.   Complications/Tolerance None; patient tolerated the procedure well. Chest X-ray is ordered to confirm no post-procedural complication.   EBL Minimal   Specimen(s) None   [name] ACNP-BC [place] Pager # [phone] OR # [phone] if no answer

## 2020-03-19 NOTE — Progress Notes (Signed)
NAME:  Wanda Ramirez, MRN:  [id number], DOB:  [birth year], LOS: 41 ADMISSION DATE:  02/13/2020, CONSULTATION DATE:  9/24 REFERRING MD:  Heber [place], CHIEF COMPLAINT:  Dyspnea   Brief History   49 yo female admitted with COVID 19 pneumonia on 9/13, had been diagnosed with COVID on 9/9.  On 9/24 her condition worsened and PCCM was consulted, she was sent to the ICU and treated with BIPAP, then eventually intubated.  Started on CRRT through a tunneled HD cath. Extubated on 10/6 but re-intubated on 10/8.  Has been challenged by sedation needs since admission.   Past Medical History  Sleep apnea Morbid obesity Hypothyroidism Hypertension GERD  End-stage renal disease on HD MWF Diabetes Asthma  Significant Hospital Events   Admitted 9/14 9/24 - Lying in bed on side currently undergoing iHD,  She is lethargic but will arouse to verbal stimuli but quickly falls back to sleep.  RN states patient has had progressive decline over the last 12hrs 9/25 - -currently in medical ICU to [facility].  Bed 12.  She has been on BiPAP with Precedex overnight.  This morning on the low-dose of Precedex she got agitated.?  Also had melena.  She also desaturated.  Nursing then increase her Precedex and since then she has been more stable.  Yesterday dialysis was cut short.  9/26 -unable to run CRRT with prone ventilation.  Resume supine ventilation but still unable to draw blood back. 9/27 transferred to 61m. IR replaced HD catheter 10/1 on CRRT and vasopressors 10/6 - extubated 10/7 - ad some stridor and increased WOB so placed on BIPAP and dexamethasone given.  10/15 weaning sedation. midaz off 10/16 move to 53M 10/18 trach  Consults:  PCCM Nephrology  Procedures:  9/23 tunneled HD cath placement >  10/8 ETT >   Significant Diagnostic Tests:  9/21 Vas ultrasound> thrombosed LUE fistula 9/21 VQ scan > negative for PE 9/23 echo> LVEF 60-65%, flat ventrcile consistent with RV pressure overload, RV severely  enlarged, moderate reduction in RV function  Micro Data:  9/13 blood > neg   9/14 sars cov 2 > pos 9/26 blood > neg 10/8 resp > MSSA 10/8 blood >   Antimicrobials:  9/14 remdesivir > 9/19 9/15 tocilizumab > x1 9/20 cefepime > 9/29 9/20 azithro >  9/25 10/8 vanc > 10/9 10/8 zosyn > x1 10/10 ceftriaxone > 10/16  Interim history/subjective:   Awaiting trach   Objective   Blood pressure 122/71, pulse 73, temperature 99.6 F (37.6 C), temperature source Axillary, resp. rate (Abnormal) 35, height 5\' 2"  (1.575 m), weight 111.1 kg, SpO2 98 %.    Vent Mode: PRVC FiO2 (%):  [40 %] 40 % Set Rate:  [35 bmp] 35 bmp Vt Set:  [400 mL] 400 mL PEEP:  [8 cmH20] 8 cmH20 Plateau Pressure:  [24 cmH20-33 cmH20] 24 cmH20   Intake/Output Summary (Last 24 hours) at 03/19/2020 1032 Last data filed at 03/19/2020 0900 Gross per 24 hour  Intake 1916.89 ml  Output 600 ml  Net 1316.89 ml   Filed Weights   03/17/20 1146 03/18/20 0600 03/19/20 0600  Weight: 106 kg 105.1 kg 111.1 kg    Examination: General this is a 49 year old female who is resting in bed no distress but gets anxious easily w. 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 Seems anxious Plan PAD protocol Cont RASS goal -1 Currently on Fent and precedex but hopefully we can start backing down after trach     Anemia of critical and chronic illness No evidence of current bleeding but did BRB per rectum about 48 hrs ago and has not been seen again.  Did get transfusion 48 hrs ago. Plan Trend cbc Transfuse for hgb < 7  Hypothyroidism Plan Cont synthroid   DM2 with hyperglycemia W/in goal Plan Goal 140-180 Cont current resistant ssi May need to add back Basal after tubefeeds resumed   Best practice:  Diet: tube feeding Pain/Anxiety/Delirium protocol (if indicated): as above VAP protocol (if indicated): yes DVT prophylaxis: has been on eliquis for high risk of VTE/ now on hold for tracheostomy can resume after trach   GI prophylaxis: Pantoprazole for stress ulcer prophylaxis Glucose control: SSI Mobility: bed rest Code Status: full Family Communication: none bedside, called Tiona on 10/17, gave her an update Disposition: remain in ICU  Cct 35 minutes   [name] ACNP-BC [place] Pager # [phone] OR # [phone] if no answer

## 2020-03-19 NOTE — Procedures (Signed)
Diagnostic Bronchoscopy  Wanda Ramirez  [id number]  Jun 25, 1970  Date:03/19/20  Time:3:56 PM   Provider Performing:Satina Jerrell   Procedure: Diagnostic Bronchoscopy ([id number])  Indication(s) Assist with direct visualization of tracheostomy placement  Consent Risks of the procedure as well as the alternatives and risks of each were explained to the patient and/or caregiver.  Consent for the procedure was obtained.   Anesthesia See separate tracheostomy note   Time Out Verified patient identification, verified procedure, site/side was marked, verified correct patient position, special equipment/implants available, medications/allergies/relevant history reviewed, required imaging and test results available.   Sterile Technique Usual hand hygiene, masks, gowns, and gloves were used   Procedure Description Bronchoscope advanced through endotracheal tube and into airway.  After suctioning out tracheal secretions, bronchoscope used to provide direct visualization of tracheostomy placement.   Complications/Tolerance None; patient tolerated the procedure well.   EBL None  Specimen(s)  A BAL of the lingula was performed post bronchoscopy.  Wanda Brood, MD [facility] ICU Physician [place]  Pager: [phone] Mobile: [phone] After hours: [phone].  03/19/2020, 3:57 PM

## 2020-03-19 NOTE — Progress Notes (Signed)
Assisted family with tele-visit via elink 

## 2020-03-20 DIAGNOSIS — J9620 Acute and chronic respiratory failure, unspecified whether with hypoxia or hypercapnia: Secondary | ICD-10-CM | POA: Diagnosis not present

## 2020-03-20 DIAGNOSIS — U071 COVID-19: Secondary | ICD-10-CM | POA: Diagnosis not present

## 2020-03-20 LAB — COMPREHENSIVE METABOLIC PANEL
ALT: 75 U/L — ABNORMAL HIGH (ref 0–44)
AST: 50 U/L — ABNORMAL HIGH (ref 15–41)
Albumin: 1.3 g/dL — ABNORMAL LOW (ref 3.5–5.0)
Alkaline Phosphatase: 479 U/L — ABNORMAL HIGH (ref 38–126)
Anion gap: 21 — ABNORMAL HIGH (ref 5–15)
BUN: 101 mg/dL — ABNORMAL HIGH (ref 6–20)
CO2: 19 mmol/L — ABNORMAL LOW (ref 22–32)
Calcium: 9.7 mg/dL (ref 8.9–10.3)
Chloride: 96 mmol/L — ABNORMAL LOW (ref 98–111)
Creatinine, Ser: 7.65 mg/dL — ABNORMAL HIGH (ref 0.44–1.00)
GFR, Estimated: 6 mL/min — ABNORMAL LOW (ref >60)
Glucose, Bld: 325 mg/dL — ABNORMAL HIGH (ref 70–99)
Potassium: 4.2 mmol/L (ref 3.5–5.1)
Sodium: 136 mmol/L (ref 135–145)
Total Bilirubin: 0.6 mg/dL (ref 0.3–1.2)
Total Protein: 6.3 g/dL — ABNORMAL LOW (ref 6.5–8.1)

## 2020-03-20 LAB — GLUCOSE, CAPILLARY
Glucose-Capillary: 244 mg/dL — ABNORMAL HIGH (ref 70–99)
Glucose-Capillary: 222 mg/dL — ABNORMAL HIGH (ref 70–99)
Glucose-Capillary: 240 mg/dL — ABNORMAL HIGH (ref 70–99)
Glucose-Capillary: 243 mg/dL — ABNORMAL HIGH (ref 70–99)
Glucose-Capillary: 143 mg/dL — ABNORMAL HIGH (ref 70–99)

## 2020-03-20 LAB — CBC
HCT: 26.2 % — ABNORMAL LOW (ref 36.0–46.0)
Hemoglobin: 8.4 g/dL — ABNORMAL LOW (ref 12.0–15.0)
MCH: 30.5 pg (ref 26.0–34.0)
MCHC: 32.1 g/dL (ref 30.0–36.0)
MCV: 95.3 fL (ref 80.0–100.0)
Platelets: 514 10*3/uL — ABNORMAL HIGH (ref 150–400)
RBC: 2.75 MIL/uL — ABNORMAL LOW (ref 3.87–5.11)
RDW: 17.9 % — ABNORMAL HIGH (ref 11.5–15.5)
WBC: 23.8 10*3/uL — ABNORMAL HIGH (ref 4.0–10.5)
nRBC: 0 % (ref 0.0–0.2)

## 2020-03-20 MED ORDER — ALBUMIN HUMAN 25 % IV SOLN
25.0000 g | Freq: Once | INTRAVENOUS | Status: AC
  Administered 2020-03-20: 25 g via INTRAVENOUS
  Filled 2020-03-20 (×2): qty 100

## 2020-03-20 MED ORDER — FENTANYL CITRATE (PF) 100 MCG/2ML IJ SOLN: 50.0000 ug | INTRAMUSCULAR | Status: DC | PRN

## 2020-03-20 MED ORDER — QUETIAPINE FUMARATE 50 MG PO TABS
50.0000 mg | ORAL_TABLET | Freq: Two times a day (BID) | ORAL | Status: DC
  Administered 2020-03-20 – 2020-03-24 (×9): 50 mg
  Filled 2020-03-20 (×9): qty 1

## 2020-03-20 MED ORDER — FENTANYL CITRATE (PF) 2500 MCG/50ML IJ SOLN
0.0000 ug/h | Status: DC
  Administered 2020-03-20 – 2020-03-21 (×2): 200 ug/h via INTRAVENOUS
  Filled 2020-03-20: qty 100

## 2020-03-20 MED ORDER — INSULIN DETEMIR 100 UNIT/ML ~~LOC~~ SOLN
40.0000 [IU] | Freq: Two times a day (BID) | SUBCUTANEOUS | Status: DC
  Administered 2020-03-20 – 2020-03-21 (×3): 40 [IU] via SUBCUTANEOUS
  Filled 2020-03-20 (×4): qty 0.4

## 2020-03-20 MED ORDER — INSULIN ASPART 100 UNIT/ML ~~LOC~~ SOLN
12.0000 [IU] | SUBCUTANEOUS | Status: DC
  Administered 2020-03-20 – 2020-03-22 (×9): 12 [IU] via SUBCUTANEOUS

## 2020-03-20 MED ORDER — DOCUSATE SODIUM 50 MG/5ML PO LIQD: 100.0000 mg | Freq: Two times a day (BID) | ORAL | Status: DC

## 2020-03-20 MED ORDER — FENTANYL CITRATE (PF) 2500 MCG/50ML IJ SOLN
100.0000 ug/h | Status: DC
  Filled 2020-03-20: qty 100

## 2020-03-20 MED ORDER — FENTANYL CITRATE (PF) 100 MCG/2ML IJ SOLN
50.0000 ug | INTRAMUSCULAR | Status: DC | PRN
  Administered 2020-03-20: 100 ug via INTRAVENOUS
  Filled 2020-03-20: qty 2

## 2020-03-20 MED ORDER — APIXABAN 2.5 MG PO TABS
2.5000 mg | ORAL_TABLET | Freq: Two times a day (BID) | ORAL | Status: DC
  Administered 2020-03-20 – 2020-03-29 (×18): 2.5 mg
  Filled 2020-03-20 (×18): qty 1

## 2020-03-20 MED ORDER — POLYETHYLENE GLYCOL 3350 17 G PO PACK
17.0000 g | PACK | Freq: Every day | ORAL | Status: DC
  Administered 2020-03-23 – 2020-03-27 (×5): 17 g
  Filled 2020-03-20 (×5): qty 1

## 2020-03-20 NOTE — Progress Notes (Addendum)
NAME:  Wanda Ramirez, MRN:  [id number], DOB:  1971-01-28, LOS: 68 ADMISSION DATE:  02/13/2020, CONSULTATION DATE:  9/24 REFERRING MD:  Heber [place], CHIEF COMPLAINT:  Dyspnea   Brief History   49 yo female admitted with COVID 19 pneumonia on 9/13, had been diagnosed with COVID on 9/9.  On 9/24 her condition worsened and PCCM was consulted, she was sent to the ICU and treated with BIPAP, then eventually intubated.  Started on CRRT through a tunneled HD cath. Extubated on 10/6 but re-intubated on 10/8.  Has been challenged by sedation needs since admission.   Past Medical History  Sleep apnea Morbid obesity Hypothyroidism Hypertension GERD  End-stage renal disease on HD MWF Diabetes Asthma  Significant Hospital Events   Admitted 9/14 9/24 - Lying in bed on side currently undergoing iHD,  She is lethargic but will arouse to verbal stimuli but quickly falls back to sleep.  RN states patient has had progressive decline over the last 12hrs 9/25 - -currently in medical ICU to [facility].  Bed 12.  She has been on BiPAP with Precedex overnight.  This morning on the low-dose of Precedex she got agitated.?  Also had melena.  She also desaturated.  Nursing then increase her Precedex and since then she has been more stable.  Yesterday dialysis was cut short.  9/26 -unable to run CRRT with prone ventilation.  Resume supine ventilation but still unable to draw blood back. 9/27 transferred to 9m. IR replaced HD catheter 10/1 on CRRT and vasopressors 10/6 - extubated 10/7 - ad some stridor and increased WOB so placed on BIPAP and dexamethasone given.  10/15 weaning sedation. midaz off 10/16 move to 19M 10/18 s/p 6 proximal XLT Shiley tracheostomy   Consults:  PCCM Nephrology  Procedures:  9/23 tunneled HD cath placement >  10/8 ETT >  10/18 6 proximal XLT Shiley tracheostomy   Significant Diagnostic Tests:  9/21 Vas ultrasound> thrombosed LUE fistula 9/21 VQ scan > negative for PE 9/23 echo>  LVEF 60-65%, flat ventrcile consistent with RV pressure overload, RV severely enlarged, moderate reduction in RV function  Micro Data:  9/13 blood > neg   9/14 sars cov 2 > pos 9/26 blood > neg 10/8 resp > MSSA 10/8 blood >   Antimicrobials:  9/14 remdesivir > 9/19 9/15 tocilizumab > x1 9/20 cefepime > 9/29 9/20 azithro >  9/25 10/8 vanc > 10/9 10/8 zosyn > x1 10/10 ceftriaxone > 10/16  Interim history/subjective:   Awaiting trach   Objective   Blood pressure 119/66, pulse 90, temperature 98.8 F (37.1 C), temperature source Axillary, resp. rate 16, height 5\' 2"  (1.575 m), weight 111.8 kg, SpO2 100 %.    Vent Mode: PRVC FiO2 (%):  [40 %-100 %] 40 % Set Rate:  [35 bmp] 35 bmp Vt Set:  [400 mL] 400 mL PEEP:  [8 cmH20] 8 cmH20 Plateau Pressure:  [16 cmH20-28 cmH20] 21 cmH20   Intake/Output Summary (Last 24 hours) at 03/20/2020 0946 Last data filed at 03/20/2020 0800 Gross per 24 hour  Intake 1725.3 ml  Output 200 ml  Net 1525.3 ml   Filed Weights   03/19/20 0600 03/20/20 0400 03/20/20 0645  Weight: 111.1 kg 111.6 kg 111.8 kg    Examination: General this is a 49 year old female who is resting in bed no distress but gets anxious easily w. 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 Will trend another day  If cont to rise may need to ck abd [place]   Acute metabolic encephalopathy w/ on-going need for sedation on mechanical ventilation -responds seems to nod appropriately. Seems anxious Plan PAD protocol Cont RASS goal -1 Currently on Fent and precedex but hopefully we can start backing down after trach    Anemia of critical and chronic illness No evidence of current bleeding but did BRB per rectum about 48 hrs ago and has not been seen again.  Did get transfusion 48 hrs ago. Plan Trend cbc Transfuse for hgb < 7  Hypothyroidism Plan Cont synthroid   DM2 with hyperglycemia W/in goal Plan Goal 140-180 Cont current resistant ssi Add basal Levemir   Best practice:  Diet: tube feeding Pain/Anxiety/Delirium protocol (if indicated): as above VAP protocol (if indicated): yes DVT prophylaxis: has been on eliquis for high risk of VTE/ will resume anticoagulation    GI prophylaxis: Pantoprazole for stress ulcer prophylaxis Glucose control: SSI Mobility: bed rest Code Status: full Family Communication: none bedside, called Wanda Ramirez on 10/17, gave her an update Disposition: remain in ICU       [name]. DNP, CCRN, FNP-BC [place]

## 2020-03-20 NOTE — Progress Notes (Signed)
Follow up Called to bedside. Her RR was increased. She was dyssynchronous w/ vent. Currently sedated on precedex and fent gtt. Placed her on PSV. PS 8/peep 8. VTs in 600-800 no accessory use. She appears very comfortable on this setting.   Plan Will dc fent gtt. Change to PRN. She seems to be doing well w/ Oxy via tube and more comfortable w/ trach Initiate PSV as tolerated. She actually may be able to transition to ATC quicker than I had hoped.   [name] ACNP-BC [place] Pager # [phone] OR # [phone] if no answer

## 2020-03-20 NOTE — Progress Notes (Signed)
[place] Progress Note Patient Name: Wanda Ramirez DOB: 1970-12-07 MRN: [id number]   Date of Service  03/20/2020  HPI/Events of Note  Dayshift nursing concerned that the patient apirated tube feeds. No increase in oxygen requirement or PEEP.  eICU Interventions  Plan: 1. Portable CXR in AM.     Intervention Category Major Interventions: Respiratory failure - evaluation and management;Hypoxemia - evaluation and management  Lysle Dingwall 03/20/2020, 8:40 PM

## 2020-03-20 NOTE — Progress Notes (Signed)
SLP Cancellation Note  Patient Details Name: Wanda Ramirez MRN: [id number] DOB: 10-11-1970   Cancelled treatment:        Pt received trach yesterday- remains on vent and currently sedated. Will follow for readiness for PMV, BSE.   [name] 03/20/2020, 8:58 AM   Orbie Pyo Colvin Caroli.Ed Risk analyst [phone] Office [phone]

## 2020-03-20 NOTE — Evaluation (Signed)
Physical Therapy Evaluation Patient Details Name: Wanda Ramirez MRN: [id number] DOB: [birth year] Today's Date: 03/20/2020   History of Present Illness  49 yo admitted 9/13 with Covid PNA, intubated 9/24-10/6, reintubated 10/8, CRRT 9/26-10/7, trach and bronch 10/18. PMhx: ESRD TTS, obesity, HTN, GERD, hypothyroidism, DM, asthma  Clinical Impression  Pt awake on vent with visual tracking but limited response or recognition of commands. Pt with weakness all extremities without noted muscle activation any any extremity even with noxious stimuli. Pt able to stick out her tongue but did not follow other commands. Daughter visiting for the first time since admission during session and unaware of vent and reports current function and lack of command following as completely different than pt baseline. Pt with decreased cognition, strength, balance, mobility and function who will benefit from acute therapy to maximize mobility, safety and function. Daughter educated for PROM bil UE and LE and encouraged to perform during visit but stated she couldn't stay long. RN aware of lack of command following and exhibited strength.  Pt on CPAP FiO2 50% with noted secretions and SpO2 98% HR 117    Follow Up Recommendations Supervision/Assistance - 24 hour;LTACH    Equipment Recommendations  Other (comment) (TBD)    Recommendations for Other Services OT consult;Speech consult     Precautions / Restrictions Precautions Precautions: Fall Precaution Comments: trach, vent, cortrak      Mobility  Bed Mobility               General bed mobility comments: did not attempt due to lack of command following  Transfers                    Ambulation/Gait                Stairs            Wheelchair Mobility    Modified Rankin (Stroke Patients Only)       Balance                                             Pertinent Vitals/Pain Pain Assessment: Faces  (CPOT= 2) Pain Location: pt with grimace with right knee flexion Pain Descriptors / Indicators: Grimacing Pain Intervention(s): Repositioned    Home Living Family/patient expects to be discharged to:: Private residence Living Arrangements: Children Available Help at Discharge: Family;Available PRN/intermittently Type of Home: Apartment Home Access: Level entry     Home Layout: Two level;Bed/bath upstairs;1/2 bath on main level Home Equipment: None      Prior Function Level of Independence: Independent         Comments: pt normally cares for herself and drives to/from HD. Pt lives with son who works and has grandchildren     Journalist, newspaper        Extremity/Trunk Assessment   Upper Extremity Assessment Upper Extremity Assessment: RUE deficits/detail;LUE deficits/detail RUE Deficits / Details: pt with no AROM or withdrawal even with noxious stimuli, PROM WFL LUE Deficits / Details: pt with no AROM or withdrawal even with noxious stimuli, PROM WFL    Lower Extremity Assessment Lower Extremity Assessment: RLE deficits/detail;LLE deficits/detail RLE Deficits / Details: pt with no AROM even with noxious stimuli, PROM WFL LLE Deficits / Details: pt with no AROM even with noxious stimuli, PROM WFL       Communication   Communication: Tracheostomy  Cognition  Arousal/Alertness: Awake/alert Behavior During Therapy: Flat affect Overall Cognitive Status: Impaired/Different from baseline Area of Impairment: Following commands                               General Comments: pt following command to stick out her tongue but followed no further commands to blink eyes or move extremities. pt attending when being spoken too but inconsistent with preference toward right      General Comments      Exercises General Exercises - Upper Extremity Shoulder Flexion: PROM;Both;Seated;10 reps Elbow Flexion: PROM;Both;Seated;10 reps Elbow Extension: PROM;Both;Seated;10  reps General Exercises - Lower Extremity Ankle Circles/Pumps: PROM;Both;Seated;10 reps Heel Slides: PROM;Both;10 reps;Seated Hip ABduction/ADduction: PROM;Both;10 reps;Seated   Assessment/Plan    PT Assessment Patient needs continued PT services  PT Problem List Decreased strength;Decreased mobility;Decreased range of motion;Decreased activity tolerance;Decreased balance;Decreased knowledge of use of DME;Decreased cognition;Cardiopulmonary status limiting activity;Impaired sensation;Decreased knowledge of precautions;Obesity;Decreased safety awareness       PT Treatment Interventions Therapeutic exercise;DME instruction;Balance training;Functional mobility training;Therapeutic activities;Patient/family education;Cognitive remediation;Neuromuscular re-education    PT Goals (Current goals can be found in the Care Plan section)  Acute Rehab PT Goals Patient Stated Goal: be able to walk and return home PT Goal Formulation: With family Time For Goal Achievement: 04/03/20 Potential to Achieve Goals: Fair    Frequency Min 2X/week   Barriers to discharge Decreased caregiver support;Inaccessible home environment      Co-evaluation               AM-PAC PT "6 Clicks" Mobility  Outcome Measure Help needed turning from your back to your side while in a flat bed without using bedrails?: Total Help needed moving from lying on your back to sitting on the side of a flat bed without using bedrails?: Total Help needed moving to and from a bed to a chair (including a wheelchair)?: Total Help needed standing up from a chair using your arms (e.g., wheelchair or bedside chair)?: Total Help needed to walk in hospital room?: Total Help needed climbing 3-5 steps with a railing? : Total 6 Click Score: 6    End of Session   Activity Tolerance: Patient tolerated treatment well Patient left: in bed;with call bell/phone within reach;with family/visitor present;with nursing/sitter in room Nurse  Communication: Mobility status;Need for lift equipment PT Visit Diagnosis: Other abnormalities of gait and mobility (R26.89);Muscle weakness (generalized) (M62.81);Other symptoms and signs involving the nervous system (R29.898)    Time: 1140-1158 PT Time Calculation (min) (ACUTE ONLY): 18 min   Charges:   PT Evaluation $PT Eval High Complexity: 1 High          Aryannah Mohon P, PT Acute Rehabilitation Services Pager: [phone] Office: [phone]   Bartlomiej Jenkinson B Jemmie Rhinehart 03/20/2020, 1:31 PM

## 2020-03-20 NOTE — Progress Notes (Signed)
[facility] Progress Note  Subjective: pt seen in ICU.  Pt had HD this am, 2.4 L removed  Vitals:   03/20/20 1500 03/20/20 1515 03/20/20 1530 03/20/20 1540  BP: 122/63 132/68 139/67   Pulse: 77 95 80   Resp: 19 (!) 29 19   Temp:    99.4 F (37.4 C)  TempSrc:    Axillary  SpO2: 98% 99% 99%   Weight:      Height:        Exam: Critically ill obese CVS- RRR without murmurs rubs or gallops RS- CTA ventilator dependent ETT tube ABD- BS present soft non-distended   EXT- no edema LE's, trace edema UE's    OP HD: TTS 4h 36mn 450/800 2/2.25 bath 111.5kg Hep 2000 L AVF - darbe 50 ug q week, last 9/7 - calc 1.0 tiw - 9/9 Hb 10.0, tsat 22%   Summary: 49yo female w/ ESRD on TTS HD admitted for COVID PNA on 02/13/20. She was intubated. She had CRRT around 9/26- 10/7.  Now is back on intermittent HD. Last HD was 10/16. She will get a trach today d/t prolonged ventilation. She had MSSA pna. DM2.   Assessment/ Plan: 1. COVID pna /sp trach - on vent.  2. MSSA HCAP/ VAP - finished course of abx.  3. ESRD - sp CRRT 9/26- 10/7. Now back on iHD. HD today.  4. BP/ volume - no pressors or BP lowering meds. Close to dry wt, UF 2 L today.  5. HD access: LUA AVF found to be clotted here, will need new perm access at some point when pt is more stable. TDC placed by IR here x 2.  6. 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 DM2 - per pmd     Rob Shooter Tangen 03/20/2020, 4:29 PM   Recent Labs  Lab 03/17/20 0616 03/17/20 0616 03/17/20 1820 03/17/20 1820 03/19/20 0345 03/20/20 0419 03/20/20 1305  K 4.5   < > 3.8   < > 3.7 4.2  --   BUN 79*   < > 36*   < > 77* 101*  --   CREATININE 6.62*   < > 3.49*   < > 6.01* 7.65*  --   CALCIUM 9.6   < > 8.9   < > 9.7 9.7  --   PHOS 7.7*  --  4.6  --   --   --   --   HGB 6.9*   < > 8.0*   < > 8.0*  --  8.4*   < > = values in this interval not  displayed.   Inpatient medications: . apixaban  2.5 mg Per Tube BID  . B-complex with vitamin C  1 tablet Per Tube Daily  . chlorhexidine gluconate (MEDLINE KIT)  15 mL Mouth Rinse BID  . Chlorhexidine Gluconate Cloth  6 each Topical Q0600  . darbepoetin (ARANESP) injection - DIALYSIS  150 mcg Intravenous Q Tue-HD  . docusate  100 mg Per Tube BID  . feeding supplement (PROSource TF)  45 mL Per Tube BID  . fiber  1 packet Per Tube BID  . insulin aspart  0-20 Units Subcutaneous Q4H  . insulin aspart  12 Units Subcutaneous Q4H  . insulin detemir  40 Units Subcutaneous BID  . levothyroxine  125 mcg Per Tube Q0600  . mouth rinse  15 mL Mouth Rinse 10 times  per day  . oxyCODONE  10 mg Per Tube Q6H  . pantoprazole sodium  40 mg Per Tube BID  . polyethylene glycol  17 g Per Tube Daily  . QUEtiapine  50 mg Per Tube BID  . sodium chloride flush  10-40 mL Intracatheter Q12H  . sodium chloride flush  3 mL Intravenous Q12H   . sodium chloride Stopped (03/11/20 1509)  . sodium chloride    . sodium chloride    . sodium chloride    . dexmedetomidine (PRECEDEX) IV infusion 1.2 mcg/kg/hr (03/20/20 1514)  . dextrose    . feeding supplement (NEPRO CARB STEADY) 1,000 mL (03/20/20 1517)  . fentaNYL infusion INTRAVENOUS     sodium chloride, sodium chloride, sodium chloride, sodium chloride, acetaminophen (TYLENOL) oral liquid 160 mg/5 mL, albuterol, alteplase, dextrose, fentaNYL (SUBLIMAZE) injection, fentaNYL (SUBLIMAZE) injection, heparin, heparin, influenza vac split quadrivalent PF, lidocaine (PF), lidocaine-prilocaine, lip balm, pentafluoroprop-tetrafluoroeth, sodium chloride flush, sodium chloride flush

## 2020-03-20 NOTE — Progress Notes (Signed)
ATTESTATION & SIGNATURE   STAFF NOTE: I, Dr Ann Lions have personally reviewed patient's available data, including medical history, events of note, physical examination and test results as part of my evaluation. I have discussed with resident/NP and other care providers such as pharmacist, RN and RRT.  In addition,  I personally evaluated patient and elicited key findings of   S: Date of admit 02/13/2020 with LOS 26 for today 03/20/2020 : Wanda Ramirez is  S/p trach yesterday.  No complications. On fent gtt, precedex gtt. No bleeding.  Followed commands. Toleraing HD. APP plans to restart low dose eliquis  O:  Blood pressure 119/66, pulse 90, temperature 98.8 F (37.1 C), temperature source Axillary, resp. rate 16, height 5\' 2"  (1.575 m), weight 111.8 kg, SpO2 100 %.   Obese Trach site looks good No bleeding Sync with vent ABd obese   LABS    PULMONARY Recent Labs  Lab 03/13/20 1135  PHART 7.327*  PCO2ART 47.5  PO2ART 122*  HCO3 24.7  TCO2 26  O2SAT 98.0    CBC Recent Labs  Lab 03/17/20 0616 03/17/20 1820 03/19/20 0345  HGB 6.9* 8.0* 8.0*  HCT 21.9* 25.2* 24.9*  WBC 22.7* 22.1* 21.7*  PLT 423* 405* 459*    COAGULATION No results for input(s): INR in the last 168 hours.  CARDIAC  No results for input(s): TROPONINI in the last 168 hours. No results for input(s): PROBNP in the last 168 hours.   CHEMISTRY Recent Labs  Lab 03/15/20 0716 03/15/20 0716 03/17/20 0616 03/17/20 0616 03/17/20 1820 03/17/20 1820 03/19/20 0345 03/20/20 0419  NA 139  --  139  --  134*  --  136 136  K 5.9*   < > 4.5   < > 3.8   < > 3.7 4.2  CL 97*  --  99  --  94*  --  96* 96*  CO2 25  --  23  --  26  --  21* 19*  GLUCOSE 113*  --  210*  --  173*  --  193* 325*  BUN 79*  --  79*  --  36*  --  77* 101*  CREATININE 6.60*  --  6.62*  --  3.49*  --  6.01* 7.65*  CALCIUM 9.7  --  9.6  --  8.9  --  9.7 9.7  PHOS 7.1*  --  7.7*  --  4.6  --   --   --    < > = values in this  interval not displayed.   Estimated Creatinine Clearance: 10.5 mL/min (A) (by C-G formula based on SCr of 7.65 mg/dL (H)).   LIVER Recent Labs  Lab 03/15/20 0716 03/17/20 0616 03/17/20 1820 03/19/20 0345 03/20/20 0419  AST  --   --   --  62* 50*  ALT  --   --   --  79* 75*  ALKPHOS  --   --   --  523* 479*  BILITOT  --   --   --  0.6 0.6  PROT  --   --   --  6.4* 6.3*  ALBUMIN 1.1* 1.1* 1.2* 1.3* 1.3*     INFECTIOUS No results for input(s): LATICACIDVEN, PROCALCITON in the last 168 hours.   ENDOCRINE CBG (last 3)  Recent Labs    03/19/20 2326 03/20/20 0323 03/20/20 0809  GLUCAP 294* 244* 222*         IMAGING x24h  - image(s) personally visualized  -  highlighted in bold DG Chest Port 1 View  Result Date: 03/19/2020 CLINICAL DATA:  Tracheostomy. EXAM: PORTABLE CHEST 1 VIEW COMPARISON:  One-view chest x-ray 03/19/2020 at 5:13 a.m. FINDINGS: Heart size is normal. Endotracheal tube has been replaced with a tracheostomy tube. The tip is 0.5 cm from the carina, in satisfactory position. Enteric tube courses off the inferior border the film. Right IJ line is stable. Interstitial and airspace opacities are unchanged. No pneumothorax or pneumomediastinum is present. IMPRESSION: 1. Tracheostomy tube in satisfactory position. 2. Stable diffuse interstitial and airspace disease. Findings are concerning for infection or edema. Electronically Signed   By: San Morelle M.D.   On: 03/19/2020 16:42      A: Acute on chronic resp failure following covid - in ESRD patient s/p trach 03/19/20 High concern for PE 9/26-9/29 (RV enlargement moderate and d-dimer > 20) - hybrid approach of Rx v proph chosen - 03/08/20  P: Trach support Vent support Ok to restart low dose eliquis  Total 1 month from original start through 04/08/20 Check duplex LE   Anti-infectives (From admission, onward)   Start     Dose/Rate Route Frequency Ordered Stop   03/11/20 1200  cefTRIAXone  (ROCEPHIN) 2 g in sodium chloride 0.9 % 100 mL IVPB        2 g 200 mL/hr over 30 Minutes Intravenous Every 24 hours 03/11/20 1107 03/17/20 1307   03/10/20 1200  piperacillin-tazobactam (ZOSYN) IVPB 3.375 g  Status:  Discontinued        3.375 g 12.5 mL/hr over 240 Minutes Intravenous Every 12 hours 03/10/20 1117 03/10/20 1245   03/10/20 1130  vancomycin (VANCOCIN) IVPB 1000 mg/200 mL premix        1,000 mg 200 mL/hr over 60 Minutes Intravenous  Once 03/10/20 1117 03/10/20 1258   03/10/20 1000  vancomycin variable dose per unstable renal function (pharmacist dosing)  Status:  Discontinued         Does not apply See admin instructions 03/09/20 1412 03/14/20 0810   03/09/20 2300  piperacillin-tazobactam (ZOSYN) IVPB 2.25 g  Status:  Discontinued        2.25 g 100 mL/hr over 30 Minutes Intravenous Every 8 hours 03/09/20 1412 03/10/20 1117   03/09/20 1415  piperacillin-tazobactam (ZOSYN) IVPB 3.375 g        3.375 g 100 mL/hr over 30 Minutes Intravenous  Once 03/09/20 1412 03/09/20 1537   03/09/20 1415  vancomycin (VANCOCIN) 2,250 mg in sodium chloride 0.9 % 500 mL IVPB        2,250 mg 250 mL/hr over 120 Minutes Intravenous  Once 03/09/20 1412 03/09/20 1639   02/28/20 2200  ceFEPIme (MAXIPIME) 1 g in sodium chloride 0.9 % 100 mL IVPB        1 g 200 mL/hr over 30 Minutes Intravenous Every 12 hours 02/28/20 1305 02/29/20 2220   02/28/20 0907  ceFEPIme (MAXIPIME) 1 g in sodium chloride 0.9 % 100 mL IVPB  Status:  Discontinued        1 g 200 mL/hr over 30 Minutes Intravenous Every 24 hours 02/27/20 0939 02/28/20 1305   02/26/20 2200  ceFEPIme (MAXIPIME) 2 g in sodium chloride 0.9 % 100 mL IVPB  Status:  Discontinued        2 g 200 mL/hr over 30 Minutes Intravenous Every 12 hours 02/26/20 1519 02/27/20 0939   02/25/20 1200  ceFEPIme (MAXIPIME) 2 g in sodium chloride 0.9 % 100 mL IVPB  Status:  Discontinued  2 g 200 mL/hr over 30 Minutes Intravenous Every T-Th-Sa (Hemodialysis) 02/25/20 0955  02/26/20 1519   02/24/20 1904  azithromycin (ZITHROMAX) 500 mg in sodium chloride 0.9 % 250 mL IVPB  Status:  Discontinued        500 mg 250 mL/hr over 60 Minutes Intravenous Every 24 hours 02/24/20 1904 02/26/20 0815   02/24/20 1900  azithromycin (ZITHROMAX) 250 mg in dextrose 5 % 125 mL IVPB  Status:  Discontinued        250 mg 125 mL/hr over 60 Minutes Intravenous Every 24 hours 02/24/20 1845 02/24/20 1904   02/24/20 1800  ceFEPIme (MAXIPIME) 2 g in sodium chloride 0.9 % 100 mL IVPB  Status:  Discontinued        2 g 200 mL/hr over 30 Minutes Intravenous Every Fri (Hemodialysis) 02/24/20 1326 02/25/20 0942   02/22/20 1745  ceFAZolin (ANCEF) IVPB 2g/100 mL premix        2 g 200 mL/hr over 30 Minutes Intravenous  Once 02/22/20 1744 02/22/20 1931   02/22/20 0600  ceFAZolin (ANCEF) IVPB 2g/100 mL premix        2 g 200 mL/hr over 30 Minutes Intravenous To Radiology 02/21/20 1658 02/22/20 1900   02/22/20 0000  ceFAZolin (ANCEF) IVPB 1 g/50 mL premix  Status:  Discontinued        1 g 100 mL/hr over 30 Minutes Intravenous To Radiology 02/21/20 1645 02/21/20 1658   02/21/20 1800  azithromycin (ZITHROMAX) tablet 250 mg  Status:  Discontinued       "Followed by" Linked Group Details   250 mg Oral Daily-1800 02/20/20 1658 02/24/20 1852   02/21/20 1800  ceFEPIme (MAXIPIME) 2 g in sodium chloride 0.9 % 100 mL IVPB  Status:  Discontinued        2 g 200 mL/hr over 30 Minutes Intravenous Every T-Th-Sa (1800) 02/20/20 1737 02/24/20 1326   02/20/20 1800  azithromycin (ZITHROMAX) tablet 500 mg       "Followed by" Linked Group Details   500 mg Oral Daily 02/20/20 1658 02/20/20 1731   02/20/20 1745  ceFEPIme (MAXIPIME) 1 g in sodium chloride 0.9 % 100 mL IVPB        1 g 200 mL/hr over 30 Minutes Intravenous STAT 02/20/20 1737 02/21/20 0747   02/19/20 1145  remdesivir 100 mg in sodium chloride 0.9 % 100 mL IVPB        100 mg 200 mL/hr over 30 Minutes Intravenous Daily 02/19/20 1131 02/19/20 1443    02/15/20 1000  remdesivir 100 mg in sodium chloride 0.9 % 100 mL IVPB  Status:  Discontinued       "Followed by" Linked Group Details   100 mg 200 mL/hr over 30 Minutes Intravenous Daily 02/14/20 0944 02/14/20 0951   02/15/20 1000  remdesivir 100 mg in sodium chloride 0.9 % 100 mL IVPB        100 mg 200 mL/hr over 30 Minutes Intravenous Daily 02/14/20 0952 02/17/20 1102   02/14/20 1030  remdesivir 100 mg in sodium chloride 0.9 % 100 mL IVPB        100 mg 200 mL/hr over 30 Minutes Intravenous Every 30 min 02/14/20 0952 02/14/20 1129   02/14/20 0945  remdesivir 200 mg in sodium chloride 0.9% 250 mL IVPB  Status:  Discontinued       "Followed by" Linked Group Details   200 mg 580 mL/hr over 30 Minutes Intravenous Once 02/14/20 0944 02/14/20 0951       Rest  per NP/medical resident whose note is outlined above and that I agree with  The patient is critically ill with multiple organ systems failure and requires high complexity decision making for assessment and support, frequent evaluation and titration of therapies, application of advanced monitoring technologies and extensive interpretation of multiple databases.   Critical Care Time devoted to patient care services described in this note is  31  Minutes. This time reflects time of care of this signee Dr Brand Males. This critical care time does not reflect procedure time, or teaching time or supervisory time of PA/NP/Med student/Med Resident etc but could involve care discussion time     Dr. Brand Males, M.D., [facility].C.P Pulmonary and Critical Care Medicine Staff Physician [facility] Pager: [phone], If no answer or between  15:00h - 7:00h: call [phone]  03/20/2020 9:44 AM

## 2020-03-20 NOTE — Progress Notes (Signed)
Inpatient Diabetes Program Recommendations  AACE/ADA: New Consensus Statement on Inpatient Glycemic Control (2015)  Target Ranges:  Prepandial:   less than 140 mg/dL      Peak postprandial:   less than 180 mg/dL (1-2 hours)      Critically ill patients:  140 - 180 mg/dL   Lab Results  Component Value Date   GLUCAP 222 (H) 03/20/2020   HGBA1C 12.8 (H) 02/14/2020    Review of Glycemic Control Results for ALEX, MCMANIGAL (MRN [id number]) as of 03/20/2020 11:27  Ref. Range 03/19/2020 03:49 03/19/2020 08:11 03/19/2020 12:03 03/19/2020 16:35 03/19/2020 19:32 03/19/2020 23:26 03/20/2020 03:23 03/20/2020 08:09  Glucose-Capillary Latest Ref Range: 70 - 99 mg/dL 175 (H) 147 (H) 169 (H) 180 (H) 93 294 (H) 244 (H) 222 (H)   Diabetes history:Type 2 DM Outpatient Diabetes medications:Amaryl 4 mg QAM, Victoza 1.8 mg QD, Current orders for Inpatient glycemic control:Levemir 40 units BID, Novolog 12 units Q4H, Novolog 0-20 units Q4H Vital 1.5 cal @ 50 ml/hr Inpatient Diabetes Program Recommendations:    Agree with restart of Levemir and Novolog tube feed coverage.  Please consider reducing Levemir to 30 units bid.    Thanks,  Adah Perl, RN, BC-ADM Inpatient Diabetes Coordinator Pager [phone] (8a-5p)

## 2020-03-20 NOTE — Progress Notes (Signed)
Assisted tele visit to patient with daughter. ° °Wanda Ramirez P, RN  °

## 2020-03-21 ENCOUNTER — Inpatient Hospital Stay (HOSPITAL_COMMUNITY): Payer: Medicaid Other

## 2020-03-21 DIAGNOSIS — R609 Edema, unspecified: Secondary | ICD-10-CM | POA: Diagnosis not present

## 2020-03-21 DIAGNOSIS — J9601 Acute respiratory failure with hypoxia: Secondary | ICD-10-CM | POA: Diagnosis not present

## 2020-03-21 DIAGNOSIS — U071 COVID-19: Secondary | ICD-10-CM | POA: Diagnosis not present

## 2020-03-21 LAB — GLUCOSE, CAPILLARY
Glucose-Capillary: 100 mg/dL — ABNORMAL HIGH (ref 70–99)
Glucose-Capillary: 107 mg/dL — ABNORMAL HIGH (ref 70–99)
Glucose-Capillary: 128 mg/dL — ABNORMAL HIGH (ref 70–99)
Glucose-Capillary: 140 mg/dL — ABNORMAL HIGH (ref 70–99)
Glucose-Capillary: 172 mg/dL — ABNORMAL HIGH (ref 70–99)
Glucose-Capillary: 176 mg/dL — ABNORMAL HIGH (ref 70–99)
Glucose-Capillary: 187 mg/dL — ABNORMAL HIGH (ref 70–99)
Glucose-Capillary: 55 mg/dL — ABNORMAL LOW (ref 70–99)
Glucose-Capillary: 57 mg/dL — ABNORMAL LOW (ref 70–99)

## 2020-03-21 LAB — BASIC METABOLIC PANEL
Anion gap: 15 (ref 5–15)
BUN: 53 mg/dL — ABNORMAL HIGH (ref 6–20)
CO2: 23 mmol/L (ref 22–32)
Calcium: 9 mg/dL (ref 8.9–10.3)
Chloride: 98 mmol/L (ref 98–111)
Creatinine, Ser: 4.88 mg/dL — ABNORMAL HIGH (ref 0.44–1.00)
GFR, Estimated: 10 mL/min — ABNORMAL LOW (ref >60)
Glucose, Bld: 268 mg/dL — ABNORMAL HIGH (ref 70–99)
Potassium: 3.5 mmol/L (ref 3.5–5.1)
Sodium: 136 mmol/L (ref 135–145)

## 2020-03-21 MED ORDER — MIDAZOLAM HCL 2 MG/2ML IJ SOLN
2.0000 mg | Freq: Once | INTRAMUSCULAR | Status: AC
  Administered 2020-03-21: 2 mg via INTRAVENOUS
  Filled 2020-03-21: qty 2

## 2020-03-21 MED ORDER — DEXTROSE 50 % IV SOLN
INTRAVENOUS | Status: AC
  Administered 2020-03-21: 12.5 g via INTRAVENOUS
  Filled 2020-03-21: qty 50

## 2020-03-21 MED ORDER — DEXMEDETOMIDINE HCL IN NACL 400 MCG/100ML IV SOLN
0.0000 ug/kg/h | INTRAVENOUS | Status: AC
  Administered 2020-03-21: 0.7 ug/kg/h via INTRAVENOUS
  Filled 2020-03-21: qty 100

## 2020-03-21 MED ORDER — DEXTROSE 50 % IV SOLN: 12.5000 g | INTRAVENOUS | Status: AC

## 2020-03-21 MED ORDER — INSULIN DETEMIR 100 UNIT/ML ~~LOC~~ SOLN
30.0000 [IU] | Freq: Two times a day (BID) | SUBCUTANEOUS | Status: DC
  Administered 2020-03-21: 30 [IU] via SUBCUTANEOUS
  Filled 2020-03-21 (×3): qty 0.3

## 2020-03-21 MED ORDER — DEXMEDETOMIDINE HCL IN NACL 400 MCG/100ML IV SOLN
0.0000 ug/kg/h | INTRAVENOUS | Status: AC
  Administered 2020-03-21 – 2020-03-22 (×2): 1.2 ug/kg/h via INTRAVENOUS
  Administered 2020-03-22 (×2): 1 ug/kg/h via INTRAVENOUS
  Administered 2020-03-22 (×2): 1.2 ug/kg/h via INTRAVENOUS
  Administered 2020-03-23: 0.8 ug/kg/h via INTRAVENOUS
  Administered 2020-03-23: 0.7 ug/kg/h via INTRAVENOUS
  Administered 2020-03-23: 0.8 ug/kg/h via INTRAVENOUS
  Administered 2020-03-23 – 2020-03-24 (×2): 0.7 ug/kg/h via INTRAVENOUS
  Administered 2020-03-24: 1 ug/kg/h via INTRAVENOUS
  Administered 2020-03-24: 0.5 ug/kg/h via INTRAVENOUS
  Administered 2020-03-24: 0.8 ug/kg/h via INTRAVENOUS
  Administered 2020-03-24: 1.2 ug/kg/h via INTRAVENOUS
  Filled 2020-03-21 (×9): qty 100
  Filled 2020-03-21: qty 200
  Filled 2020-03-21 (×5): qty 100

## 2020-03-21 NOTE — Progress Notes (Signed)
Assisted tele visit to patient with daughter.  Lyndel Dancel M Miranda Garber, RN   

## 2020-03-21 NOTE — Progress Notes (Signed)
  Hypoglycemic Event  CBG: 55  Treatment: D50 25 mL (12.5 gm)  Symptoms: None  Follow-up CBG: CHEN:[id number] CBG Result:140  Possible Reasons for Event: Vomiting  Comments/MD notified:E-link    Lorraine Lax

## 2020-03-21 NOTE — Progress Notes (Addendum)
SLP Cancellation Note  Patient Details Name: Wanda Ramirez MRN: [id number] DOB: 09-27-1970   Cancelled treatment:        Discussed with RT re: initiating inline PMV. Her RR was high earlier- now, when resting and calm she is around 19. RT reports she has copious secretions. Will attempt tomorrow.    [name] 03/21/2020, 9:19 AM   Orbie Pyo Colvin Caroli.Ed Risk analyst [phone] Office [phone]

## 2020-03-21 NOTE — Progress Notes (Signed)
[facility] Progress Note  Subjective: pt seen in ICU. Eyes open slightly to voice, nods head occasionally to questions.   Vitals:   03/21/20 0800 03/21/20 0818 03/21/20 0830 03/21/20 1153  BP: 116/64 116/64    Pulse: 70 89    Resp: (!) 22 (!) 44    Temp:   98.6 F (37 C) 98.4 F (36.9 C)  TempSrc:   Axillary Axillary  SpO2: 100% 100%    Weight:      Height:        Exam: Critically ill obese CVS- RRR without murmurs rubs or gallops RS- CTA ventilator dependent ETT tube ABD- BS present soft non-distended   EXT- no edema LE's, trace edema UE's    OP HD: TTS 4h 74mn 450/800 2/2.25 bath 111.5kg Hep 2000 L AVF - darbe 50 ug q week, last 9/7 - calc 1.0 tiw - 9/9 Hb 10.0, tsat 22%   Summary: 49yo female w/ ESRD on TTS HD admitted for COVID PNA on 02/13/20. She was intubated. She had CRRT around 9/26- 10/7.  Now is back on intermittent HD. Last HD was 10/16. She will get a trach today d/t prolonged ventilation. She had MSSA pna. DM2.   Assessment/ Plan: 1. COVID pna /sp trach - on vent.  2. MSSA HCAP/ VAP - finished course of abx.  3. ESRD - sp CRRT 9/26- 10/7. Now back on iHD. HD today.  4. BP/ volume - no pressors or BP lowering meds. Up to dry wt but should be under dry given prolonged hosp stay, catabolism of infection, etc.  Will ^UF next HD.  5. HD access: LUA AVF found to be clotted here, will need new perm access at some point when pt is more stable. TDC placed by IR here x 2.  6. 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 DM2 - per pmd     Rob Curry Seefeldt 03/21/2020, 12:25 PM   Recent Labs  Lab 03/17/20 0616 03/17/20 0616 03/17/20 1820 03/17/20 1820 03/19/20 0345 03/19/20 0345 03/20/20 0419 03/20/20 1305 03/21/20 0451  K 4.5   < > 3.8   < > 3.7   < > 4.2  --  3.5  BUN 79*   < > 36*   < > 77*   < > 101*  --  53*  CREATININE 6.62*   < > 3.49*   < >  6.01*   < > 7.65*  --  4.88*  CALCIUM 9.6   < > 8.9   < > 9.7   < > 9.7  --  9.0  PHOS 7.7*  --  4.6  --   --   --   --   --   --   HGB 6.9*   < > 8.0*   < > 8.0*  --   --  8.4*  --    < > = values in this interval not displayed.   Inpatient medications: . apixaban  2.5 mg Per Tube BID  . B-complex with vitamin C  1 tablet Per Tube Daily  . chlorhexidine gluconate (MEDLINE KIT)  15 mL Mouth Rinse BID  . Chlorhexidine Gluconate Cloth  6 each Topical Q0600  . darbepoetin (ARANESP) injection - DIALYSIS  150 mcg Intravenous Q Tue-HD  . docusate  100 mg Per Tube BID  . feeding supplement (PROSource TF)  45 mL Per Tube BID  .  fiber  1 packet Per Tube BID  . insulin aspart  0-20 Units Subcutaneous Q4H  . insulin aspart  12 Units Subcutaneous Q4H  . insulin detemir  30 Units Subcutaneous BID  . levothyroxine  125 mcg Per Tube Q0600  . mouth rinse  15 mL Mouth Rinse 10 times per day  . oxyCODONE  10 mg Per Tube Q6H  . pantoprazole sodium  40 mg Per Tube BID  . polyethylene glycol  17 g Per Tube Daily  . QUEtiapine  50 mg Per Tube BID  . sodium chloride flush  10-40 mL Intracatheter Q12H  . sodium chloride flush  3 mL Intravenous Q12H   . sodium chloride Stopped (03/11/20 1509)  . sodium chloride    . sodium chloride    . sodium chloride    . dexmedetomidine (PRECEDEX) IV infusion 1.2 mcg/kg/hr (03/21/20 1222)  . dextrose    . feeding supplement (NEPRO CARB STEADY) 55 mL/hr at 03/21/20 0800   sodium chloride, sodium chloride, sodium chloride, sodium chloride, acetaminophen (TYLENOL) oral liquid 160 mg/5 mL, albuterol, alteplase, dextrose, heparin, heparin, influenza vac split quadrivalent PF, lidocaine (PF), lidocaine-prilocaine, lip balm, pentafluoroprop-tetrafluoroeth, sodium chloride flush, sodium chloride flush

## 2020-03-21 NOTE — Progress Notes (Signed)
[facility] Progress Note Patient Name: Wanda Ramirez DOB: 17-Dec-1970 MRN: [id number]   Date of Service  03/21/2020  HPI/Events of Note  Nursing request for BMP in AM.   eICU Interventions  Will order BMP for 5 AM.      Intervention Category Major Interventions: Other:  Keiandre Cygan Cornelia Copa 03/21/2020, 12:28 AM

## 2020-03-21 NOTE — Progress Notes (Signed)
Wasted 20mL of IV Fentanyl gtt with Mayford Knife, RN.  Medication was put in the Stericycle.

## 2020-03-21 NOTE — Progress Notes (Addendum)
NAME:  Wanda Ramirez, MRN:  [id number], DOB:  Jan 11, 1971, LOS: 49 ADMISSION DATE:  02/13/2020, CONSULTATION DATE:  9/24 REFERRING MD:  Heber [place], CHIEF COMPLAINT:  Dyspnea   Brief History   49 yo female admitted with COVID 19 pneumonia on 9/13, had been diagnosed with COVID on 9/9.  On 9/24 her condition worsened and PCCM was consulted, she was sent to the ICU and treated with BIPAP, then eventually intubated.  Started on CRRT through a tunneled HD cath. Extubated on 10/6 but re-intubated on 10/8.  Has been challenged by sedation needs since admission.   Past Medical History  Sleep apnea Morbid obesity Hypothyroidism Hypertension GERD  End-stage renal disease on HD MWF Diabetes Asthma  Significant Hospital Events   Admitted 9/14 9/24 - Lying in bed on side currently undergoing iHD,  She is lethargic but will arouse to verbal stimuli but quickly falls back to sleep.  RN states patient has had progressive decline over the last 12hrs 9/25 - -currently in medical ICU to [facility].  Bed 12.  She has been on BiPAP with Precedex overnight.  This morning on the low-dose of Precedex she got agitated.?  Also had melena.  She also desaturated.  Nursing then increase her Precedex and since then she has been more stable.  Yesterday dialysis was cut short.  9/26 -unable to run CRRT with prone ventilation.  Resume supine ventilation but still unable to draw blood back. 9/27 transferred to 61m. IR replaced HD catheter 10/1 on CRRT and vasopressors 10/6 - extubated 10/7 - ad some stridor and increased WOB so placed on BIPAP and dexamethasone given.  10/15 weaning sedation. midaz off 10/16 move to 54M 10/18 s/p 6 proximal XLT Shiley tracheostomy  10/19: Resumed Eliquis low dose. Per Dr. Thompson Caul note 10/7 will need to be on [facility] for 1 month then reasses 10/19: Episode of hypoglycemia 55.  Consults:  PCCM Nephrology  Procedures:  9/23 tunneled HD cath placement >  10/8 ETT >  10/18 6 proximal XLT  Shiley tracheostomy   Significant Diagnostic Tests:  9/21 Vas ultrasound> thrombosed LUE fistula 9/21 VQ scan > negative for PE 9/23 echo> LVEF 60-65%, flat ventrcile consistent with RV pressure overload, RV severely enlarged, moderate reduction in RV function  Micro Data:  9/13 blood > neg   9/14 sars cov 2 > pos 9/26 blood > neg 10/8 resp > MSSA 10/8 blood >   Antimicrobials:  9/14 remdesivir > 9/19 9/15 tocilizumab > x1 9/20 cefepime > 9/29 9/20 azithro >  9/25 10/8 vanc > 10/9 10/8 zosyn > x1 10/10 ceftriaxone > 10/16  Interim history/subjective:  Episode of hypoglycemia last night 55. TF stopped previously due to aspiration concerns. Chest xray obtained but remains unchanged from previous. TF restarted. Plan wean of sedation  Objective   Blood pressure 116/64, pulse 89, temperature 98.6 F (37 C), temperature source Axillary, resp. rate (!) 44, height 5\' 2"  (1.575 m), weight 111.2 kg, SpO2 100 %.    Vent Mode: PRVC FiO2 (%):  [40 %-50 %] 40 % Set Rate:  [35 bmp] 35 bmp Vt Set:  [400 mL] 400 mL PEEP:  [8 cmH20] 8 cmH20 Pressure Support:  [8 cmH20] 8 cmH20 Plateau Pressure:  [30 cmH20] 30 cmH20   Intake/Output Summary (Last 24 hours) at 03/21/2020 0926 Last data filed at 03/21/2020 0800 Gross per 24 hour  Intake 1778.44 ml  Output 1905 ml  Net -126.56 ml   Filed Weights   03/20/20 0645 03/20/20 1100  03/21/20 0448  Weight: 111.8 kg 109.8 kg 111.2 kg    Examination: GENERAL:  49 year old patient lying in the bed with no acute distress. Intubated and mechanically ventilated. On sedation EYES: Pupils equal, round, reactive to light and accommodation. No scleral icterus. Extraocular muscles intact.  HEENT: Head atraumatic, normocephalic. Unable to assess oropharynx or nasopharynx.  NECK:  Supple, no jugular venous distention. No thyroid enlargement, no tenderness.  LUNGS: Coarse breath sounds bilaterally, no wheezing, rales,rhonchi or crepitation. No use of  accessory muscles of respiration.  CARDIOVASCULAR: S1, S2 normal. No murmurs, rubs, or gallops.  ABDOMEN: Soft, nontender, nondistended. Bowel sounds present. No organomegaly or mass.  EXTREMITIES: Generalized edema NEUROLOGIC: Opens eyes spontaneously and follows commands intermittently. Nods appropriately to some orientation question. Moves all extremities against gravity. Sensation intact. Gait not checked.  SKIN: No obvious rash, lesion, or ulcer.   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 Will check one more time tomorrow If cont to rise may need to ck abd [place]   Acute metabolic encephalopathy w/ on-going need for sedation on mechanical ventilation -Responsive to verbal stimuli. Nods appropriately. Appears anxious Plan PAD protocol Cont RASS goal -1 Currently on Fent and precedex but hopefully we can start backing down  Continue Seroquel and Oxycodone   Anemia of critical and chronic illness No evidence of current bleeding but did BRB per rectum about 48 hrs ago and has not been seen again.  Did get transfusion 48 hrs ago. Plan Trend cbc Transfuse for hgb < 7  Hypothyroidism Plan Cont synthroid   DM2 with hyperglycemia Episode of hypoglycemia last night Plan Goal 140-180 Cont current resistant ssi Decrease Levemir to 30 units BID   Best practice:  Diet: tube feeding Pain/Anxiety/Delirium  protocol (if indicated): as above VAP protocol (if indicated): yes DVT prophylaxis: has been on eliquis for high risk of VTE/ resumed anticoagulation   E;liquis X 1 month per Dr. Thompson Caul note 10/7. Will re-assess need to continue GI prophylaxis: Pantoprazole for stress ulcer prophylaxis Glucose control: SSI Mobility: bed rest Code Status: full Family Communication: none bedside, Will attempt to call family today for update Disposition: remain in ICU       [name]. DNP, CCRN, FNP-BC [place]

## 2020-03-21 NOTE — Progress Notes (Signed)
Assisted tele visit to patient with daughter.  Margaret Pyle, RN

## 2020-03-21 NOTE — Progress Notes (Addendum)
[facility] Progress Note Patient Name: Wanda Ramirez DOB: 02-25-1971 MRN: [id number]   Date of Service  03/21/2020  HPI/Events of Note  Agitation  eICU Interventions  Plan: 1. Will renew expired order for Precedex IV infusion and increase ceiling to 1.2 mcg/kg/min. 2. Versed 2 mg IV X 1 now.      Intervention Category Major Interventions: Delirium, psychosis, severe agitation - evaluation and management  Luisenrique Conran [place] 03/21/2020, 8:02 PM

## 2020-03-21 NOTE — Progress Notes (Signed)
Nutrition Follow-up  DOCUMENTATION CODES:   Morbid obesity  INTERVENTION:   Tube feeding via Cortrak: -Nepro @ 55 ml/h (1320 ml per day) -Prosource TF 45 ml BID  Provides 2456 kcal, 128 gm protein, 959 ml free water daily.  Nutrisource Fiber BID Continue B-complex with vitamin C via tube  NUTRITION DIAGNOSIS:   Inadequate oral intake related to inability to eat as evidenced by NPO status.  Ongoing   GOAL:   Patient will meet greater than or equal to 90% of their needs  Progressing   MONITOR:   PO intake, Supplement acceptance, Labs  REASON FOR ASSESSMENT:   Consult Assessment of nutrition requirement/status  ASSESSMENT:   49 year old female with a past medical history significant for end-stage renal disease on hemodialysis MWF, chronic hypoxic respiratory failure on 3 L nasal cannula at baseline, sleep apnea, diabetes, hypertension, hyperlipidemia, hypothyroidism, and asthma presents with complaints of worsening shortness of breath. Previously diagnosed with COVID PNA on 9/9.   9/28- start CRRT 10/4 cortrak placed; tip gastric 10/7- end CRRT 10/8 pt reintubated 10/14 s/p iHD 10/18- trach   Pt with copious amount of secretions. Remains on vent via trach. Gets agitated intermittently. TF turned off last night due to concern for aspiration. Suspect secretions could have contributed. Abdomen soft and having BMs. Plan to restart TF today. If unable to tolerate consider advancement of Cortrak to post pyloric. Suspect to see improvement with hypoglycemia once TF restarted.   EDW: 111.5 kg  Current weight: 111.2 kg   Drips: precedex  Medications: aranesp, colace, nutrisource fiber BID, SS novolog, levemir, miralax  Labs: CBG 57-172  Diet Order:   Diet Order            Diet NPO time specified  Diet effective midnight                 EDUCATION NEEDS:   Not appropriate for education at this time  Skin:  Skin Assessment: Skin Integrity Issues: Skin  Integrity Issues:: Stage II Stage II: R buttocks  Last BM:  10/20 via rectal tube  Height:   Ht Readings from Last 1 Encounters:  03/09/20 5\' 2"  (1.575 m)    Weight:   Wt Readings from Last 1 Encounters:  03/21/20 111.2 kg    Ideal Body Weight:  50 kg  BMI:  Body mass index is 44.84 kg/m.  Estimated Nutritional Needs:   Kcal:  2065-2260  Protein:  110-125 gm  Fluid:  1 L + UOP  Addalynne Golding RD, LDN Clinical Nutrition Pager listed in [place]

## 2020-03-21 NOTE — Progress Notes (Signed)
Assisted tele visit to patient with daughters.  Barbara Ahart M Jarian Longoria, RN   

## 2020-03-21 NOTE — Progress Notes (Signed)
Bilateral lower extremity venous duplex completed. Refer to "CV Proc" under chart review to view preliminary results.  03/21/2020 11:48 AM Kelby Aline., MHA, RVT, RDCS, RDMS

## 2020-03-22 DIAGNOSIS — J9601 Acute respiratory failure with hypoxia: Secondary | ICD-10-CM | POA: Diagnosis not present

## 2020-03-22 DIAGNOSIS — U071 COVID-19: Secondary | ICD-10-CM | POA: Diagnosis not present

## 2020-03-22 DIAGNOSIS — T17908A Unspecified foreign body in respiratory tract, part unspecified causing other injury, initial encounter: Secondary | ICD-10-CM

## 2020-03-22 LAB — GLUCOSE, CAPILLARY
Glucose-Capillary: 98 mg/dL (ref 70–99)
Glucose-Capillary: 32 mg/dL — CL (ref 70–99)
Glucose-Capillary: 105 mg/dL — ABNORMAL HIGH (ref 70–99)
Glucose-Capillary: 107 mg/dL — ABNORMAL HIGH (ref 70–99)
Glucose-Capillary: 167 mg/dL — ABNORMAL HIGH (ref 70–99)
Glucose-Capillary: 230 mg/dL — ABNORMAL HIGH (ref 70–99)
Glucose-Capillary: 111 mg/dL — ABNORMAL HIGH (ref 70–99)

## 2020-03-22 LAB — CBC
HCT: 22.6 % — ABNORMAL LOW (ref 36.0–46.0)
Hemoglobin: 7.3 g/dL — ABNORMAL LOW (ref 12.0–15.0)
MCH: 30.2 pg (ref 26.0–34.0)
MCHC: 32.3 g/dL (ref 30.0–36.0)
MCV: 93.4 fL (ref 80.0–100.0)
Platelets: 467 10*3/uL — ABNORMAL HIGH (ref 150–400)
RBC: 2.42 MIL/uL — ABNORMAL LOW (ref 3.87–5.11)
RDW: 16.9 % — ABNORMAL HIGH (ref 11.5–15.5)
WBC: 18.3 10*3/uL — ABNORMAL HIGH (ref 4.0–10.5)
nRBC: 0 % (ref 0.0–0.2)

## 2020-03-22 LAB — HEPATIC FUNCTION PANEL
ALT: 67 U/L — ABNORMAL HIGH (ref 0–44)
AST: 56 U/L — ABNORMAL HIGH (ref 15–41)
Albumin: 1.5 g/dL — ABNORMAL LOW (ref 3.5–5.0)
Alkaline Phosphatase: 502 U/L — ABNORMAL HIGH (ref 38–126)
Bilirubin, Direct: 0.1 mg/dL (ref 0.0–0.2)
Indirect Bilirubin: 0.5 mg/dL (ref 0.3–0.9)
Total Bilirubin: 0.6 mg/dL (ref 0.3–1.2)
Total Protein: 6.3 g/dL — ABNORMAL LOW (ref 6.5–8.1)

## 2020-03-22 MED ORDER — HEPARIN SODIUM (PORCINE) 1000 UNIT/ML DIALYSIS: 2000.0000 [IU] | Freq: Once | INTRAMUSCULAR | Status: AC

## 2020-03-22 MED ORDER — INSULIN ASPART 100 UNIT/ML ~~LOC~~ SOLN
6.0000 [IU] | SUBCUTANEOUS | Status: DC
  Administered 2020-03-22 – 2020-03-23 (×5): 6 [IU] via SUBCUTANEOUS

## 2020-03-22 MED ORDER — INSULIN DETEMIR 100 UNIT/ML ~~LOC~~ SOLN
15.0000 [IU] | Freq: Two times a day (BID) | SUBCUTANEOUS | Status: DC
  Administered 2020-03-22 – 2020-03-24 (×4): 15 [IU] via SUBCUTANEOUS
  Filled 2020-03-22 (×7): qty 0.15

## 2020-03-22 MED ORDER — CLONAZEPAM 0.5 MG PO TBDP
1.0000 mg | ORAL_TABLET | Freq: Two times a day (BID) | ORAL | Status: DC
  Administered 2020-03-22 – 2020-03-24 (×6): 1 mg
  Filled 2020-03-22 (×6): qty 2

## 2020-03-22 MED ORDER — CLONAZEPAM 0.1 MG/ML ORAL SUSPENSION: 1.0000 mg | Freq: Two times a day (BID) | ORAL | Status: DC

## 2020-03-22 MED ORDER — DEXTROSE 50 % IV SOLN: 50.0000 mL | Freq: Once | INTRAVENOUS | Status: AC

## 2020-03-22 MED ORDER — DEXTROSE 50 % IV SOLN
INTRAVENOUS | Status: AC
  Administered 2020-03-22: 50 mL via INTRAVENOUS
  Filled 2020-03-22: qty 50

## 2020-03-22 MED ORDER — HEPARIN SODIUM (PORCINE) 1000 UNIT/ML IJ SOLN
INTRAMUSCULAR | Status: AC
  Administered 2020-03-22: 2000 [IU] via INTRAVENOUS_CENTRAL
  Filled 2020-03-22: qty 1

## 2020-03-22 NOTE — Progress Notes (Signed)
Per pt's sister, Catha Gosselin not allowed to visit.

## 2020-03-22 NOTE — Progress Notes (Signed)
Sputum collected and sent to lab 

## 2020-03-22 NOTE — Progress Notes (Signed)
Assisted tele visit to patient with daughter.  Margaret Pyle, RN

## 2020-03-22 NOTE — Progress Notes (Signed)
S/w Wanda Ramirez re recent low grade fevers - requested sputum culture d/t possible aspiration earlier this week. Also discussed CVC change out d/t date of use.  Pt is off most IV meds.  Want to wait on sputum culture results to see if will need CVC access and reassess.

## 2020-03-22 NOTE — Progress Notes (Signed)
[facility] Progress Note  Subjective: pt seen in ICU. BP's stable.   Vitals:   03/22/20 1300 03/22/20 1400 03/22/20 1519 03/22/20 1543  BP: 108/70 117/67    Pulse: 86 82  80  Resp: _0 Temp:   98.9 F (37.2 C)   TempSrc:   Oral   SpO2: 99% 98%  100%  Weight:      Height:        Exam: Critically ill obese CVS- RRR without murmurs rubs or gallops RS- CTA ventilator dependent ETT tube ABD- BS present soft non-distended   EXT- no edema LE's, trace edema UE's    OP HD: TTS 4h 44mn 450/800 2/2.25 bath 111.5kg Hep 2000 L AVF - darbe 50 ug q week, last 9/7 - calc 1.0 tiw - 9/9 Hb 10.0, tsat 22%   Summary: 49yo female w/ ESRD on TTS HD admitted for COVID PNA on 02/13/20. She was intubated. She had CRRT around 9/26- 10/7.  Now is back on intermittent HD. Last HD was 10/16. She will get a trach today d/t prolonged ventilation. She had MSSA pna. DM2.   Assessment/ Plan: 1. COVID pna /sp trach - on vent.  2. MSSA HCAP/ VAP - finished course of abx.  3. ESRD - sp CRRT 9/26- 10/7. Now back on iHD. HD today.  4. BP/ volume - no pressors or BP lowering meds. Up to dry wt but should be under dry given prolonged hosp stay, catabolism of infection, etc.  Plan large UF today w/ HD.  5. HD access: LUA AVF found to be clotted here, will need new perm access at some point when pt is more stable. TDC placed by IR here x 2.  6. 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 DM2 - per pmd     Rob Cuthbert Turton 03/22/2020, 3:50 PM   Recent Labs  Lab 03/17/20 0616 03/17/20 0616 03/17/20 1820 03/19/20 0345 03/20/20 0419 03/20/20 1305 03/21/20 0451 03/22/20 0750  K 4.5   < > 3.8   < > 4.2  --  3.5  --   BUN 79*   < > 36*   < > 101*  --  53*  --   CREATININE 6.62*   < > 3.49*   < > 7.65*  --  4.88*  --   CALCIUM 9.6   < > 8.9   < > 9.7  --  9.0  --   PHOS 7.7*  --  4.6  --   --    --   --   --   HGB 6.9*   < > 8.0*   < >  --  8.4*  --  7.3*   < > = values in this interval not displayed.   Inpatient medications: . apixaban  2.5 mg Per Tube BID  . B-complex with vitamin C  1 tablet Per Tube Daily  . chlorhexidine gluconate (MEDLINE KIT)  15 mL Mouth Rinse BID  . Chlorhexidine Gluconate Cloth  6 each Topical Q0600  . clonazepam  1 mg Per Tube BID  . darbepoetin (ARANESP) injection - DIALYSIS  150 mcg Intravenous Q Tue-HD  . docusate  100 mg Per Tube BID  . feeding supplement (PROSource TF)  45 mL Per Tube BID  . fiber  1 packet Per Tube BID  . [START ON 03/23/2020] heparin  2,000 Units  Dialysis Once in dialysis  . insulin aspart  0-20 Units Subcutaneous Q4H  . insulin aspart  6 Units Subcutaneous Q4H  . insulin detemir  15 Units Subcutaneous BID  . levothyroxine  125 mcg Per Tube Q0600  . mouth rinse  15 mL Mouth Rinse 10 times per day  . oxyCODONE  10 mg Per Tube Q6H  . pantoprazole sodium  40 mg Per Tube BID  . polyethylene glycol  17 g Per Tube Daily  . QUEtiapine  50 mg Per Tube BID  . sodium chloride flush  10-40 mL Intracatheter Q12H  . sodium chloride flush  3 mL Intravenous Q12H   . sodium chloride Stopped (03/11/20 1509)  . sodium chloride    . sodium chloride    . sodium chloride    . dexmedetomidine (PRECEDEX) IV infusion 1 mcg/kg/hr (03/22/20 1400)  . dextrose    . feeding supplement (NEPRO CARB STEADY) 55 mL/hr at 03/22/20 1400   sodium chloride, sodium chloride, sodium chloride, sodium chloride, acetaminophen (TYLENOL) oral liquid 160 mg/5 mL, albuterol, alteplase, dextrose, heparin, heparin, influenza vac split quadrivalent PF, lidocaine (PF), lidocaine-prilocaine, lip balm, pentafluoroprop-tetrafluoroeth, sodium chloride flush, sodium chloride flush

## 2020-03-22 NOTE — Progress Notes (Signed)
NAME:  Wanda Ramirez, MRN:  [id number], DOB:  March 20, 1971, LOS: 67 ADMISSION DATE:  02/13/2020, CONSULTATION DATE:  9/24 REFERRING MD:  Heber [place], CHIEF COMPLAINT:  Dyspnea   Brief History   49 yo female admitted with COVID 19 pneumonia on 9/13, had been diagnosed with COVID on 9/9.  On 9/24 her condition worsened and PCCM was consulted, she was sent to the ICU and treated with BIPAP, then eventually intubated.  Started on CRRT through a tunneled HD cath. Extubated on 10/6 but re-intubated on 10/8.  Has been challenged by sedation needs since admission.   Past Medical History  Sleep apnea Morbid obesity Hypothyroidism Hypertension GERD  End-stage renal disease on HD MWF Diabetes Asthma  Significant Hospital Events   Admitted 9/14 9/24 - Lying in bed on side currently undergoing iHD,  She is lethargic but will arouse to verbal stimuli but quickly falls back to sleep.  RN states patient has had progressive decline over the last 12hrs 9/25 - -currently in medical ICU to [facility].  Bed 12.  She has been on BiPAP with Precedex overnight.  This morning on the low-dose of Precedex she got agitated.?  Also had melena.  She also desaturated.  Nursing then increase her Precedex and since then she has been more stable.  Yesterday dialysis was cut short.  9/26 -unable to run CRRT with prone ventilation.  Resume supine ventilation but still unable to draw blood back. 9/27 transferred to 52m. IR replaced HD catheter 10/1 on CRRT and vasopressors 10/6 - extubated 10/7 - ad some stridor and increased WOB so placed on BIPAP and dexamethasone given.  10/15 weaning sedation. midaz off 10/16 move to 45M 10/18 s/p 6 proximal XLT Shiley tracheostomy  10/19: Resumed Eliquis low dose. Per Dr. Thompson Caul note 10/7 will need to be on [facility] for 1 month then reasses 10/19: Episode of hypoglycemia 55. 10/21 hypoglycemic again. levemir and tubefeed coverage decreased. Low grade temp->sputum culture send had vomiting event  on 10/19 w/ concern for aspiration. Fent was stopped 10/20. Still gets anxious at night. Required increased precedex. Added clonazepam. Consults:  PCCM Nephrology  Procedures:  9/23 tunneled HD cath placement >  10/8 ETT >  10/18 6 proximal XLT Shiley tracheostomy   Significant Diagnostic Tests:  9/21 Vas ultrasound> thrombosed LUE fistula 9/21 VQ scan > negative for PE 9/23 echo> LVEF 60-65%, flat ventrcile consistent with RV pressure overload, RV severely enlarged, moderate reduction in RV function  Micro Data:  9/13 blood > neg   9/14 sars cov 2 > pos 9/26 blood > neg 10/8 resp > MSSA 10/8 blood >  10/21 sputum >>>  Antimicrobials:  9/14 remdesivir > 9/19 9/15 tocilizumab > x1 9/20 cefepime > 9/29 9/20 azithro >  9/25 10/8 vanc > 10/9 10/8 zosyn > x1 10/10 ceftriaxone > 10/16  Interim history/subjective:  No distress. Does get anxious easily   Objective   Blood pressure 123/71, pulse 66, temperature 99.1 F (37.3 C), temperature source Axillary, resp. rate (Abnormal) 31, height 5\' 2"  (1.575 m), weight 106.9 kg, SpO2 99 %.    Vent Mode: PRVC FiO2 (%):  [40 %-50 %] 40 % Set Rate:  [35 bmp] 35 bmp Vt Set:  [400 mL] 400 mL PEEP:  [8 cmH20] 8 cmH20 Plateau Pressure:  [26 cmH20] 26 cmH20   Intake/Output Summary (Last 24 hours) at 03/22/2020 0843 Last data filed at 03/22/2020 0600 Gross per 24 hour  Intake 1818.42 ml  Output 300 ml  Net 1518.[place]  ml   Filed Weights   03/20/20 1100 03/21/20 0448 03/22/20 0112  Weight: 109.8 kg 111.2 kg 106.9 kg    Examination: General this is a 49 year old female currently no distress on full support HENT # 6 proximal XLT unremarkable NCAT MMM Does have small ulceration at side of right lip from ETT Pulm coarse scattered rhonchi, equal chest rise Card RRR abd not tender + bowel sounds Ext warm and dry  Neuro awake. Follows commands. Anxious BUT appropriate. No focal motor deficits.   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 Maybe a little worse when comparing film day before. Not clear how much of this was fluid Plan Cont daily assessment for weaning Mobilization  VAP bundle Volume removal w/ HD Will ask SLP to start in-lined PT consulted  Persistent leukocytosis -completed course of CTX for MSSA PNA, low grade temp wbc curve flat.  There was concern for aspiration on 10/19.  Plan Cont trend fever curve Sputum culture   ESRD Plan Cont TTHS HD   Mod elevated LFTs-->fairly stable as of 10/21 Plan Cont to trend intermittently   Acute metabolic encephalopathy w/ on-going need for sedation on mechanical ventilation -anxious but cooperative and appropriate. Fent gtt stopped 10/20. Still on precedex Plan PAD protocol RASS goal 0 Cont Oxy Cont precedex Add clonazepam as anxiety a key factor No change in seroquel    Anemia of critical and chronic illness No evidence of current bleeding. hgb has drifted over 1 gm in last 24 hrs Plan Cont to trend cbc Transfuse for hgb < 7 Watch for evidence of bleeding (has had isolated episode of BRB per rectum while here in ICU)  Hypothyroidism Plan Synthroid   DM2 with both hypo and hyperglycemia Once again required treatment of hypoglycemia this am 10/21 Plan Goal 140-180 Cont resistant scale ssi Decrease Levemir to 15 units BID Change TF coverage to 6 aspart q 4   Best practice:  Diet: tube feeding Pain/Anxiety/Delirium protocol (if indicated): as above VAP protocol (if indicated): yes DVT prophylaxis: has been on eliquis for high risk of VTE/ resumed anticoagulation   Eliquis X 1 month per Dr. Thompson Caul note 10/7. Will re-assess need to continue GI prophylaxis: Pantoprazole for stress ulcer prophylaxis Glucose control: SSI Mobility: bed rest Code Status: full Family Communication  updated.  Disposition: remain in ICU  My cct 34 minutes.   [name] ACNP-BC [place] Pager # [phone] OR # [phone] if no answer

## 2020-03-23 DIAGNOSIS — E1169 Type 2 diabetes mellitus with other specified complication: Secondary | ICD-10-CM | POA: Diagnosis not present

## 2020-03-23 DIAGNOSIS — U071 COVID-19: Secondary | ICD-10-CM | POA: Diagnosis not present

## 2020-03-23 DIAGNOSIS — J9601 Acute respiratory failure with hypoxia: Secondary | ICD-10-CM | POA: Diagnosis not present

## 2020-03-23 DIAGNOSIS — J80 Acute respiratory distress syndrome: Secondary | ICD-10-CM | POA: Diagnosis not present

## 2020-03-23 LAB — COMPREHENSIVE METABOLIC PANEL
ALT: 57 U/L — ABNORMAL HIGH (ref 0–44)
AST: 38 U/L (ref 15–41)
Albumin: 1.6 g/dL — ABNORMAL LOW (ref 3.5–5.0)
Alkaline Phosphatase: 415 U/L — ABNORMAL HIGH (ref 38–126)
Anion gap: 15 (ref 5–15)
BUN: 49 mg/dL — ABNORMAL HIGH (ref 6–20)
CO2: 23 mmol/L (ref 22–32)
Calcium: 9.5 mg/dL (ref 8.9–10.3)
Chloride: 96 mmol/L — ABNORMAL LOW (ref 98–111)
Creatinine, Ser: 4.81 mg/dL — ABNORMAL HIGH (ref 0.44–1.00)
GFR, Estimated: 10 mL/min — ABNORMAL LOW (ref >60)
Glucose, Bld: 233 mg/dL — ABNORMAL HIGH (ref 70–99)
Potassium: 3.1 mmol/L — ABNORMAL LOW (ref 3.5–5.1)
Sodium: 134 mmol/L — ABNORMAL LOW (ref 135–145)
Total Bilirubin: 0.5 mg/dL (ref 0.3–1.2)
Total Protein: 6.2 g/dL — ABNORMAL LOW (ref 6.5–8.1)

## 2020-03-23 LAB — CULTURE, RESPIRATORY W GRAM STAIN

## 2020-03-23 LAB — CBC WITH DIFFERENTIAL/PLATELET
Abs Immature Granulocytes: 0.18 10*3/uL — ABNORMAL HIGH (ref 0.00–0.07)
Basophils Absolute: 0.1 10*3/uL (ref 0.0–0.1)
Basophils Relative: 1 %
Eosinophils Absolute: 0.2 10*3/uL (ref 0.0–0.5)
Eosinophils Relative: 1 %
HCT: 23.4 % — ABNORMAL LOW (ref 36.0–46.0)
Hemoglobin: 7.4 g/dL — ABNORMAL LOW (ref 12.0–15.0)
Immature Granulocytes: 1 %
Lymphocytes Relative: 11 %
Lymphs Abs: 1.8 10*3/uL (ref 0.7–4.0)
MCH: 29.8 pg (ref 26.0–34.0)
MCHC: 31.6 g/dL (ref 30.0–36.0)
MCV: 94.4 fL (ref 80.0–100.0)
Monocytes Absolute: 0.8 10*3/uL (ref 0.1–1.0)
Monocytes Relative: 5 %
Neutro Abs: 13 10*3/uL — ABNORMAL HIGH (ref 1.7–7.7)
Neutrophils Relative %: 81 %
Platelets: 481 10*3/uL — ABNORMAL HIGH (ref 150–400)
RBC: 2.48 MIL/uL — ABNORMAL LOW (ref 3.87–5.11)
RDW: 17 % — ABNORMAL HIGH (ref 11.5–15.5)
WBC: 16 10*3/uL — ABNORMAL HIGH (ref 4.0–10.5)
nRBC: 0 % (ref 0.0–0.2)

## 2020-03-23 LAB — GLUCOSE, CAPILLARY
Glucose-Capillary: 137 mg/dL — ABNORMAL HIGH (ref 70–99)
Glucose-Capillary: 151 mg/dL — ABNORMAL HIGH (ref 70–99)
Glucose-Capillary: 155 mg/dL — ABNORMAL HIGH (ref 70–99)
Glucose-Capillary: 165 mg/dL — ABNORMAL HIGH (ref 70–99)
Glucose-Capillary: 206 mg/dL — ABNORMAL HIGH (ref 70–99)
Glucose-Capillary: 267 mg/dL — ABNORMAL HIGH (ref 70–99)
Glucose-Capillary: 317 mg/dL — ABNORMAL HIGH (ref 70–99)

## 2020-03-23 LAB — MAGNESIUM: Magnesium: 2.2 mg/dL (ref 1.7–2.4)

## 2020-03-23 MED ORDER — POTASSIUM CHLORIDE 20 MEQ/15ML (10%) PO SOLN
40.0000 meq | Freq: Once | ORAL | Status: AC
  Administered 2020-03-23: 40 meq
  Filled 2020-03-23: qty 30

## 2020-03-23 MED ORDER — ONDANSETRON HCL 4 MG/2ML IJ SOLN
INTRAMUSCULAR | Status: AC
  Administered 2020-03-23: 4 mg via INTRAVENOUS
  Filled 2020-03-23: qty 2

## 2020-03-23 MED ORDER — HEPARIN SODIUM (PORCINE) 1000 UNIT/ML DIALYSIS
2000.0000 [IU] | INTRAMUSCULAR | Status: AC | PRN
  Administered 2020-03-27: 2000 [IU] via INTRAVENOUS_CENTRAL

## 2020-03-23 MED ORDER — INSULIN ASPART 100 UNIT/ML ~~LOC~~ SOLN
10.0000 [IU] | SUBCUTANEOUS | Status: DC
  Administered 2020-03-23 (×2): 10 [IU] via SUBCUTANEOUS
  Administered 2020-03-23: 3 [IU] via SUBCUTANEOUS
  Administered 2020-03-23 – 2020-03-24 (×2): 10 [IU] via SUBCUTANEOUS

## 2020-03-23 MED ORDER — ONDANSETRON HCL 4 MG/2ML IJ SOLN
4.0000 mg | Freq: Four times a day (QID) | INTRAMUSCULAR | Status: DC | PRN
  Administered 2020-04-22: 4 mg via INTRAVENOUS
  Filled 2020-03-23: qty 2

## 2020-03-23 NOTE — Progress Notes (Signed)
[facility] Progress Note  Subjective: pt seen in ICU. BP's stable. 3.5 L off w/ HD yest  Vitals:   03/23/20 0600 03/23/20 0700 03/23/20 0754 03/23/20 0828  BP: 126/65 119/66  121/64  Pulse: 74 73  82  Resp: 17 (!) 24  (!) 24  Temp:   99.7 F (37.6 C)   TempSrc:   Oral   SpO2: 100% 100%  100%  Weight:      Height:        Exam: More alert, interacting, sitting up, NG in , trach in place on vent CVS- RRR without murmurs rubs or gallops RS- CTA ventilator dependent ETT tube ABD- BS present soft non-distended   EXT- no edema LE's    OP HD: TTS 4h 63mn 450/800 2/2.25 bath 111.5kg Hep 2000 L AVF - darbe 50 ug q week, last 9/7 - calc 1.0 tiw - 9/9 Hb 10.0, tsat 22%   Summary: 49yo female w/ ESRD on TTS HD admitted for COVID PNA on 02/13/20. She was intubated. She had CRRT around 9/26- 10/7.  Now is back on intermittent HD. Last HD was 10/16. She will get a trach today d/t prolonged ventilation. She had MSSA pna. DM2.   Assessment/ Plan: 1. COVID pna /sp trach - on vent.  2. MSSA HCAP/ VAP - finished course of abx.  3. ESRD - sp CRRT 9/26- 10/7. Now back on iHD. Next HD Sat.  4. BP/ volume - no pressors or BP lowering meds. About 7kg under dry wt, euvolemic on exam. 3.5 L off  yest w/ HD, slight BP dip.   5. HD access: LUA AVF found to be clotted here, will need new perm access at some point when pt is more stable. TDC x2 here by IR.  6. 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 DM2 - per pmd     Rob Tayt Moyers 03/23/2020, 9:45 AM   Recent Labs  Lab 03/17/20 0616 03/17/20 0616 03/17/20 1820 03/19/20 0345 03/20/20 1305 03/21/20 0451 03/22/20 0750 03/23/20 0455  K 4.5   < > 3.8   < >  --  3.5  --  3.1*  BUN 79*   < > 36*   < >  --  53*  --  49*  CREATININE 6.62*   < > 3.49*   < >  --  4.88*  --  4.81*  CALCIUM 9.6   < > 8.9   < >  --  9.0  --  9.5  PHOS 7.7*   --  4.6  --   --   --   --   --   HGB 6.9*   < > 8.0*   < >   < >  --  7.3* 7.4*   < > = values in this interval not displayed.   Inpatient medications: . apixaban  2.5 mg Per Tube BID  . B-complex with vitamin C  1 tablet Per Tube Daily  . chlorhexidine gluconate (MEDLINE KIT)  15 mL Mouth Rinse BID  . Chlorhexidine Gluconate Cloth  6 each Topical Q0600  . clonazepam  1 mg Per Tube BID  . darbepoetin (ARANESP) injection - DIALYSIS  150 mcg Intravenous Q Tue-HD  . docusate  100 mg Per Tube BID  . feeding supplement (PROSource TF)  45 mL Per Tube BID  . fiber  1 packet Per Tube BID  .  heparin  2,000 Units Dialysis Once in dialysis  . insulin aspart  0-20 Units Subcutaneous Q4H  . insulin aspart  6 Units Subcutaneous Q4H  . insulin detemir  15 Units Subcutaneous BID  . levothyroxine  125 mcg Per Tube Q0600  . mouth rinse  15 mL Mouth Rinse 10 times per day  . oxyCODONE  10 mg Per Tube Q6H  . pantoprazole sodium  40 mg Per Tube BID  . polyethylene glycol  17 g Per Tube Daily  . QUEtiapine  50 mg Per Tube BID  . sodium chloride flush  10-40 mL Intracatheter Q12H  . sodium chloride flush  3 mL Intravenous Q12H   . sodium chloride Stopped (03/11/20 1509)  . sodium chloride    . sodium chloride    . sodium chloride    . dexmedetomidine (PRECEDEX) IV infusion 0.8 mcg/kg/hr (03/23/20 0600)  . dextrose    . feeding supplement (NEPRO CARB STEADY) 1,000 mL (03/23/20 0530)   sodium chloride, sodium chloride, sodium chloride, sodium chloride, acetaminophen (TYLENOL) oral liquid 160 mg/5 mL, albuterol, alteplase, dextrose, heparin, heparin, influenza vac split quadrivalent PF, lidocaine (PF), lidocaine-prilocaine, lip balm, pentafluoroprop-tetrafluoroeth, sodium chloride flush, sodium chloride flush

## 2020-03-23 NOTE — Progress Notes (Signed)
Physical Therapy Treatment Patient Details Name: Wanda Ramirez MRN: [id number] DOB: 26-Nov-1970 Today's Date: 03/23/2020    History of Present Illness 49 yo admitted 9/13 with Covid PNA, intubated 9/24-10/6, reintubated 10/8, CRRT 9/26-10/7, trach and bronch 10/18. PMhx: ESRD TTS, obesity, HTN, GERD, hypothyroidism, DM, asthma    PT Comments    Pt with significant improvement in function and movement from evaluation. Pt currently able to move all extremities, follow commands and assist with rolling. Pt with poor balance and trunk control with right lean in sitting and requires cues to use arms to assist with trunk activation to sitting. Pt if able to come off vent would be a great CIR candidate and will continue to work toward progress mobility and transfers acutely.   CPAP FiO2 40% Spo2 96% HR 88 BP 134/86 (99)    Follow Up Recommendations  LTACH;Supervision/Assistance - 24 hour;CIR (pending medical stability and progression)     Equipment Recommendations  Other (comment) (TBD)    Recommendations for Other Services OT consult     Precautions / Restrictions Precautions Precautions: Fall Precaution Comments: trach, vent, cortrak    Mobility  Bed Mobility Overal bed mobility: Needs Assistance Bed Mobility: Rolling;Supine to Sit Rolling: Mod assist   Supine to sit: Max assist     General bed mobility comments: rolling bil for pad positioning. Bed function to transition from supine to full chair position. Mod assist to translate trunk from surface in sitting to unsupported sitting with right lean with cues/assist to position in midline. Max +2 to slide toward [facility]  Transfers                    Ambulation/Gait                 Stairs             Wheelchair Mobility    Modified Rankin (Stroke Patients Only)       Balance Overall balance assessment: Needs assistance Sitting-balance support: Bilateral upper extremity supported Sitting  balance-Leahy Scale: Poor Sitting balance - Comments: bil UE support on rails in sitting with right lean and assist to correct to midline                                    Cognition Arousal/Alertness: Awake/alert Behavior During Therapy: Flat affect Overall Cognitive Status: Impaired/Different from baseline Area of Impairment: Safety/judgement                         Safety/Judgement: Decreased awareness of deficits     General Comments: pt following all single step commands this session with decreased awareness of deficits particularly balance in sitting      Exercises General Exercises - Lower Extremity Short Arc Quad: AROM;Both;10 reps;Seated Hip Flexion/Marching: AAROM;Both;Seated;10 reps    General Comments        Pertinent Vitals/Pain Pain Assessment: No/denies pain    Home Living                      Prior Function            PT Goals (current goals can now be found in the care plan section) Progress towards PT goals: Progressing toward goals    Frequency    Min 3X/week      PT Plan Discharge plan needs to be updated;Frequency needs to be updated  Co-evaluation              AM-PAC PT "6 Clicks" Mobility   Outcome Measure  Help needed turning from your back to your side while in a flat bed without using bedrails?: A Lot Help needed moving from lying on your back to sitting on the side of a flat bed without using bedrails?: Total Help needed moving to and from a bed to a chair (including a wheelchair)?: Total Help needed standing up from a chair using your arms (e.g., wheelchair or bedside chair)?: Total Help needed to walk in hospital room?: Total Help needed climbing 3-5 steps with a railing? : Total 6 Click Score: 7    End of Session   Activity Tolerance: Patient tolerated treatment well Patient left: in bed;with call bell/phone within reach Nurse Communication: Mobility status;Need for lift  equipment PT Visit Diagnosis: Other abnormalities of gait and mobility (R26.89);Muscle weakness (generalized) (M62.81);Other symptoms and signs involving the nervous system (R29.898)     Time: 1027-2536 PT Time Calculation (min) (ACUTE ONLY): 25 min  Charges:  $Therapeutic Exercise: 8-22 mins $Therapeutic Activity: 8-22 mins                     Deshonda Cryderman P, PT Acute Rehabilitation Services Pager: [phone] Office: [phone]    Sandy Salaam Alix Lahmann 03/23/2020, 12:24 PM

## 2020-03-23 NOTE — Progress Notes (Signed)
Inpatient Diabetes Program Recommendations  AACE/ADA: New Consensus Statement on Inpatient Glycemic Control (2015)  Target Ranges:  Prepandial:   less than 140 mg/dL      Peak postprandial:   less than 180 mg/dL (1-2 hours)      Critically ill patients:  140 - 180 mg/dL   Lab Results  Component Value Date   GLUCAP 267 (H) 03/23/2020   HGBA1C 12.8 (H) 02/14/2020    Review of Glycemic Control Results for Wanda Ramirez, Wanda Ramirez (MRN [id number]) as of 03/23/2020 13:39  Ref. Range 03/22/2020 07:57 03/22/2020 07:59 03/22/2020 09:24 03/22/2020 12:10 03/22/2020 15:18 03/22/2020 19:34 03/22/2020 23:26 03/23/2020 03:22 03/23/2020 07:53 03/23/2020 11:25 03/23/2020 12:16  Glucose-Capillary Latest Ref Range: 70 - 99 mg/dL 28 (LL) 32 (LL) 105 (H) 107 (H) 167 (H) 230 (H) 111 (H) 151 (H) 206 (H) 317 (H) 267 (H)   Current orders for Inpatient glycemic control:  Levemir 15 units bid Novolog 10 units Q4 hours Novolog 0-20 units Q4 hours  Nepro 55 ml/hour  Inpatient Diabetes Program Recommendations:    Glucose 206 this am was covered 2 hours 20 minutes late with Novolog Correction.  Next glucose level was in the 300 range.  Watch for now as Novolog Tube feed coverage increased to 10 units Q4 hours  Thanks,  Tama Headings RN, MSN, BC-ADM Inpatient Diabetes Coordinator Team Pager [phone] (8a-5p)

## 2020-03-23 NOTE — Progress Notes (Signed)
[place] Progress Note Patient Name: STORMIE VENTOLA DOB: Apr 27, 1971 MRN: [id number]   Date of Service  03/23/2020  HPI/Events of Note  Patient needs a.m. labs ordered.  eICU Interventions   a.m. labs ordered.        Kerry Kass Richardine Peppers 03/23/2020, 4:56 AM

## 2020-03-23 NOTE — Progress Notes (Addendum)
NAME:  Wanda Ramirez, MRN:  [id number], DOB:  01/18/1971, LOS: 2 ADMISSION DATE:  02/13/2020, CONSULTATION DATE:  9/24 REFERRING MD:  Heber Goldfield, CHIEF COMPLAINT:  Dyspnea   Brief History   49 yo female admitted with COVID 19 pneumonia on 9/13, had been diagnosed with COVID on 9/9.  On 9/24 her condition worsened and PCCM was consulted, she was sent to the ICU and treated with BIPAP, then eventually intubated.  Started on CRRT through a tunneled HD cath. Extubated on 10/6 but re-intubated on 10/8.  Has been challenged by sedation needs since admission.   Past Medical History  Sleep apnea Morbid obesity Hypothyroidism Hypertension GERD  End-stage renal disease on HD MWF Diabetes Asthma  Significant Hospital Events   Admitted 9/14 9/24 - Lying in bed on side currently undergoing iHD,  She is lethargic but will arouse to verbal stimuli but quickly falls back to sleep.  RN states patient has had progressive decline over the last 12hrs 9/25 - -currently in medical ICU to [facility].  Bed 12.  She has been on BiPAP with Precedex overnight.  This morning on the low-dose of Precedex she got agitated.?  Also had melena.  She also desaturated.  Nursing then increase her Precedex and since then she has been more stable.  Yesterday dialysis was cut short.  9/26 -unable to run CRRT with prone ventilation.  Resume supine ventilation but still unable to draw blood back. 9/27 transferred to 55m. IR replaced HD catheter 10/1 on CRRT and vasopressors 10/6 - extubated 10/7 - ad some stridor and increased WOB so placed on BIPAP and dexamethasone given.  10/15 weaning sedation. midaz off 10/16 move to 67M 10/18 s/p 6 proximal XLT Shiley tracheostomy  10/19: Resumed Eliquis low dose. Per Dr. Thompson Caul note 10/7 will need to be on [facility] for 1 month then reasses 10/19: Episode of hypoglycemia 55. 10/21 hypoglycemic again. levemir and tubefeed coverage decreased. Low grade temp->sputum culture send had vomiting event  on 10/19 w/ concern for aspiration. Fent was stopped 10/20. Still gets anxious at night. Required increased precedex. Added clonazepam. 10/22 attempting PSV trials  Consults:  PCCM Nephrology  Procedures:  9/23 tunneled HD cath placement >  10/8 ETT >  10/18 6 proximal XLT Shiley tracheostomy   Significant Diagnostic Tests:  9/21 Vas ultrasound> thrombosed LUE fistula 9/21 VQ scan > negative for PE 9/23 echo> LVEF 60-65%, flat ventrcile consistent with RV pressure overload, RV severely enlarged, moderate reduction in RV function  Micro Data:  9/13 blood > neg   9/14 sars cov 2 > pos 9/26 blood > neg 10/8 resp > MSSA 10/8 blood >  10/21 sputum >>>  Antimicrobials:  9/14 remdesivir > 9/19 9/15 tocilizumab > x1 9/20 cefepime > 9/29 9/20 azithro >  9/25 10/8 vanc > 10/9 10/8 zosyn > x1 10/10 ceftriaxone > 10/16  Interim history/subjective:  No distress. Does get anxious easily   Objective   Blood pressure 134/86, pulse 100, temperature 99.7 F (37.6 C), temperature source Oral, resp. rate 14, height 5\' 2"  (1.575 m), weight 105.9 kg, SpO2 97 %.    Vent Mode: PSV;CPAP FiO2 (%):  [40 %] 40 % Set Rate:  [35 bmp] 35 bmp Vt Set:  [400 mL] 400 mL PEEP:  [5 cmH20-8 cmH20] 5 cmH20 Pressure Support:  [14 cmH20] 14 cmH20 Plateau Pressure:  [17 cmH20-26 cmH20] 17 cmH20   Intake/Output Summary (Last 24 hours) at 03/23/2020 1010 Last data filed at 03/23/2020 0600 Gross per 24  hour  Intake 1572.55 ml  Output 3700 ml  Net -2127.45 ml   Filed Weights   03/22/20 0800 03/22/20 1147 03/23/20 0300  Weight: 107.3 kg 103.6 kg 105.9 kg    Examination: General this is a very pleasant 49 year old female. Currently on PSV and looks comfortable. She seems much less anxious today HENT 6 proxx xlt trach is unremarkable left small ulceration at corner of mouth has improved. (this was a pressure ulcer from ETT( Pulm Cl dec bases. VTs 400s on PSV. 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 Still w/ low grade temps There was concern for aspiration on 10/19.  Plan Follow up most recent sputum culture sent 10/21 Working on getting right IJ CVL out. Will see if we can get PIV Hold of on abx for now    ESRD Plan TTHS HD  Hypokalemia  Plan Replace and recheck  Nausea Plan zofran PRN   Mod elevated LFTs-->fairly stable as of 10/22 Plan Check intermittently   Acute metabolic encephalopathy w/ on-going need for sedation on mechanical ventilation -anxious but cooperative and appropriate. Fent gtt stopped 10/20. Still on precedex but weaning this daily. She looks much less anxious since adding clonazepam  Plan RASS goal 0 Cont oxy (was home med) Wean precedex No change in seroquel Cont clonazepam     Anemia of critical and chronic illness No evidence of current bleeding.  Plan Trend cbc Transfuse for hgb < 7  Hypothyroidism Plan Synthroid   DM2 with both hypo and hyperglycemia Once again required treatment of hypoglycemia this am 10/21, now hyperglycemic  Plan Goal 140-180 Cont resistant ssi Cont Levemir 15 bid Increase TF coverage to 10 aspart     Best practice:  Diet: tube feeding Pain/Anxiety/Delirium protocol (if indicated): as above VAP protocol (if indicated): yes DVT prophylaxis: has been on eliquis for high risk of VTE/ resumed anticoagulation   Eliquis X 1 month per Dr. Thompson Caul note 10/7.  Will re-assess need to continue GI prophylaxis: Pantoprazole for stress ulcer prophylaxis Glucose control: SSI Mobility: bed rest Code Status: full Family Communication updated.  Disposition: remain in ICU   [name] ACNP-BC [place] Pager # [phone] OR # [phone] if no answer

## 2020-03-23 NOTE — Progress Notes (Signed)
Assisted tele visit to patient with family member.  Huckleberry Martinson D Breken Nazari, RN   

## 2020-03-23 NOTE — Progress Notes (Signed)
RT assisted ST with inline passy muir valve. RT slowly deflated cuff balloon and decreased pt's PEEP to 0. Pt tolerated procedure well. Pt now back on wean settings, as previous.

## 2020-03-24 ENCOUNTER — Inpatient Hospital Stay (HOSPITAL_COMMUNITY): Payer: Medicaid Other

## 2020-03-24 DIAGNOSIS — J9601 Acute respiratory failure with hypoxia: Secondary | ICD-10-CM | POA: Diagnosis not present

## 2020-03-24 DIAGNOSIS — E1169 Type 2 diabetes mellitus with other specified complication: Secondary | ICD-10-CM | POA: Diagnosis not present

## 2020-03-24 DIAGNOSIS — J80 Acute respiratory distress syndrome: Secondary | ICD-10-CM | POA: Diagnosis not present

## 2020-03-24 DIAGNOSIS — U071 COVID-19: Secondary | ICD-10-CM | POA: Diagnosis not present

## 2020-03-24 LAB — COMPREHENSIVE METABOLIC PANEL
ALT: 48 U/L — ABNORMAL HIGH (ref 0–44)
AST: 35 U/L (ref 15–41)
Albumin: 1.7 g/dL — ABNORMAL LOW (ref 3.5–5.0)
Alkaline Phosphatase: 420 U/L — ABNORMAL HIGH (ref 38–126)
Anion gap: 10 (ref 5–15)
BUN: 19 mg/dL (ref 6–20)
CO2: 27 mmol/L (ref 22–32)
Calcium: 8.3 mg/dL — ABNORMAL LOW (ref 8.9–10.3)
Chloride: 97 mmol/L — ABNORMAL LOW (ref 98–111)
Creatinine, Ser: 2.43 mg/dL — ABNORMAL HIGH (ref 0.44–1.00)
GFR, Estimated: 24 mL/min — ABNORMAL LOW (ref >60)
Glucose, Bld: 167 mg/dL — ABNORMAL HIGH (ref 70–99)
Potassium: 3.6 mmol/L (ref 3.5–5.1)
Sodium: 134 mmol/L — ABNORMAL LOW (ref 135–145)
Total Bilirubin: 0.7 mg/dL (ref 0.3–1.2)
Total Protein: 6.8 g/dL (ref 6.5–8.1)

## 2020-03-24 LAB — GLUCOSE, CAPILLARY
Glucose-Capillary: 104 mg/dL — ABNORMAL HIGH (ref 70–99)
Glucose-Capillary: 110 mg/dL — ABNORMAL HIGH (ref 70–99)
Glucose-Capillary: 130 mg/dL — ABNORMAL HIGH (ref 70–99)
Glucose-Capillary: 156 mg/dL — ABNORMAL HIGH (ref 70–99)
Glucose-Capillary: 235 mg/dL — ABNORMAL HIGH (ref 70–99)
Glucose-Capillary: 96 mg/dL (ref 70–99)

## 2020-03-24 LAB — CBC
HCT: 23 % — ABNORMAL LOW (ref 36.0–46.0)
Hemoglobin: 7.3 g/dL — ABNORMAL LOW (ref 12.0–15.0)
MCH: 29.9 pg (ref 26.0–34.0)
MCHC: 31.7 g/dL (ref 30.0–36.0)
MCV: 94.3 fL (ref 80.0–100.0)
Platelets: 522 10*3/uL — ABNORMAL HIGH (ref 150–400)
RBC: 2.44 MIL/uL — ABNORMAL LOW (ref 3.87–5.11)
RDW: 16.9 % — ABNORMAL HIGH (ref 11.5–15.5)
WBC: 18.8 10*3/uL — ABNORMAL HIGH (ref 4.0–10.5)
nRBC: 0 % (ref 0.0–0.2)

## 2020-03-24 LAB — CULTURE, RESPIRATORY W GRAM STAIN: Culture: NORMAL

## 2020-03-24 MED ORDER — HEPARIN SODIUM (PORCINE) 1000 UNIT/ML IJ SOLN
INTRAMUSCULAR | Status: AC
  Administered 2020-03-24: 2000 [IU] via INTRAVENOUS_CENTRAL
  Filled 2020-03-24: qty 2

## 2020-03-24 MED ORDER — MIDAZOLAM HCL 2 MG/2ML IJ SOLN
INTRAMUSCULAR | Status: AC
  Administered 2020-03-24: 2 mg
  Filled 2020-03-24: qty 2

## 2020-03-24 MED ORDER — MIDAZOLAM HCL 2 MG/2ML IJ SOLN
4.0000 mg | Freq: Once | INTRAMUSCULAR | Status: AC
  Administered 2020-03-24: 4 mg via INTRAVENOUS

## 2020-03-24 MED ORDER — INSULIN ASPART 100 UNIT/ML ~~LOC~~ SOLN
5.0000 [IU] | SUBCUTANEOUS | Status: DC
  Administered 2020-03-24 – 2020-04-11 (×94): 5 [IU] via SUBCUTANEOUS

## 2020-03-24 MED ORDER — MIDAZOLAM HCL 2 MG/2ML IJ SOLN
2.0000 mg | INTRAMUSCULAR | Status: DC | PRN
  Administered 2020-03-24 – 2020-03-27 (×5): 2 mg via INTRAVENOUS
  Filled 2020-03-24 (×5): qty 2

## 2020-03-24 MED ORDER — ALBUTEROL SULFATE (2.5 MG/3ML) 0.083% IN NEBU
INHALATION_SOLUTION | RESPIRATORY_TRACT | Status: AC
  Filled 2020-03-24: qty 3

## 2020-03-24 MED ORDER — DEXMEDETOMIDINE HCL IN NACL 400 MCG/100ML IV SOLN
0.0000 ug/kg/h | INTRAVENOUS | Status: DC
  Administered 2020-03-24: 0.4 ug/kg/h via INTRAVENOUS
  Administered 2020-03-25 – 2020-03-26 (×2): 0.3 ug/kg/h via INTRAVENOUS
  Filled 2020-03-24 (×2): qty 100

## 2020-03-24 MED ORDER — NOREPINEPHRINE 4 MG/250ML-% IV SOLN: 0.0000 ug/min | INTRAVENOUS | Status: DC

## 2020-03-24 MED ORDER — GUAIFENESIN 100 MG/5ML PO SOLN
5.0000 mL | Freq: Four times a day (QID) | ORAL | Status: AC | PRN
  Administered 2020-03-24 – 2020-03-25 (×2): 100 mg
  Filled 2020-03-24 (×2): qty 5

## 2020-03-24 MED ORDER — INSULIN DETEMIR 100 UNIT/ML ~~LOC~~ SOLN
12.0000 [IU] | Freq: Two times a day (BID) | SUBCUTANEOUS | Status: DC
  Administered 2020-03-24 – 2020-04-01 (×17): 12 [IU] via SUBCUTANEOUS
  Filled 2020-03-24 (×19): qty 0.12

## 2020-03-24 MED ORDER — MIDAZOLAM HCL 2 MG/2ML IJ SOLN
INTRAMUSCULAR | Status: AC
  Filled 2020-03-24: qty 2

## 2020-03-24 MED ORDER — MIDAZOLAM HCL (PF) 10 MG/2ML IJ SOLN: 4.0000 mg | Freq: Once | INTRAMUSCULAR | Status: DC

## 2020-03-24 NOTE — Progress Notes (Signed)
RT note- Patient has continuous coughing with moderate amount thin tan and white frothy secretions. Dr.Desai is aware.

## 2020-03-24 NOTE — Progress Notes (Signed)
[facility] Progress Note  Subjective: pt seen in ICU. No new c/o's. Weaning precedex today, anxiety better on clonazepam.   Vitals:   03/24/20 1000 03/24/20 1015 03/24/20 1030 03/24/20 1042  BP: (!) 149/90 (!) 146/119 101/68 101/68  Pulse: (!) 121 (!) 121 (!) 113 (!) 122  Resp: (!) 22 (!) 28 (!) 25 (!) 34  Temp:      TempSrc:      SpO2: (!) 89% 93% 92% 92%  Weight:      Height:    '5\' 2"'  (1.575 m)    Exam: More alert, NG in , +trach  CVS- RRR without murmurs rubs or gallops RS- CTA ventilator dependent ETT tube ABD- BS present soft non-distended   EXT- no edema LE's    OP HD: TTS 4h 29mn 450/800 2/2.25 bath 111.5kg Hep 2000 L AVF - darbe 50 ug q week, last 9/7 - calc 1.0 tiw - 9/9 Hb 10.0, tsat 22%   Summary: 49yo female w/ ESRD on TTS HD admitted for COVID PNA on 02/13/20. She was intubated. She had CRRT around 9/26- 10/7.  Now is on intermittent HD. She is sp trach. She had MSSA pna. DM2.   Assessment/ Plan: 1. COVID pna /sp trach - on vent 2. MSSA HCAP/ VAP - finished course of abx.  3. ESRD - sp CRRT 9/26- 10/7. Now back on iHD. Next HD Sat.  4. BP/ volume - no pressors or BP lowering meds. About 7kg under dry wt, euvolemic on exam.  5. HD access: her LUA AVF clotted while here, will need new perm access at some point when more stable. TDC x2 here by IR.  6. 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 DM2 - per pmd     Rob Jasenia Weilbacher 03/24/2020, 11:27 AM   Recent Labs  Lab 03/17/20 1820 03/19/20 0345 03/21/20 0451 03/22/20 0750 03/23/20 0455 03/24/20 0551  K 3.8   < > 3.5  --  3.1*  --   BUN 36*   < > 53*  --  49*  --   CREATININE 3.49*   < > 4.88*  --  4.81*  --   CALCIUM 8.9   < > 9.0  --  9.5  --   PHOS 4.6  --   --   --   --   --   HGB 8.0*   < >  --    < > 7.4* 7.3*   < > = values in this interval not displayed.   Inpatient medications: .  apixaban  2.5 mg Per Tube BID  . B-complex with vitamin C  1 tablet Per Tube Daily  . chlorhexidine gluconate (MEDLINE KIT)  15 mL Mouth Rinse BID  . Chlorhexidine Gluconate Cloth  6 each Topical Q0600  . clonazepam  1 mg Per Tube BID  . darbepoetin (ARANESP) injection - DIALYSIS  150 mcg Intravenous Q Tue-HD  . docusate  100 mg Per Tube BID  . feeding supplement (PROSource TF)  45 mL Per Tube BID  . fiber  1 packet Per Tube BID  . insulin aspart  0-20 Units Subcutaneous Q4H  . insulin aspart  5 Units Subcutaneous Q4H  . insulin detemir  12 Units Subcutaneous BID  . levothyroxine  125 mcg Per Tube Q0600  . mouth rinse  15 mL Mouth Rinse 10 times per day  . oxyCODONE  10 mg Per Tube Q6H  . pantoprazole sodium  40 mg Per Tube BID  . polyethylene glycol  17 g Per Tube Daily  . QUEtiapine  50 mg Per Tube BID  . sodium chloride flush  10-40 mL Intracatheter Q12H  . sodium chloride flush  3 mL Intravenous Q12H   . sodium chloride Stopped (03/11/20 1509)  . sodium chloride    . sodium chloride    . sodium chloride    . dexmedetomidine (PRECEDEX) IV infusion Stopped (03/24/20 1017)  . dextrose    . feeding supplement (NEPRO CARB STEADY) 55 mL/hr at 03/24/20 0600   sodium chloride, sodium chloride, sodium chloride, sodium chloride, acetaminophen (TYLENOL) oral liquid 160 mg/5 mL, albuterol, alteplase, dextrose, heparin, heparin, heparin, influenza vac split quadrivalent PF, lidocaine (PF), lidocaine-prilocaine, lip balm, ondansetron (ZOFRAN) IV, pentafluoroprop-tetrafluoroeth, sodium chloride flush, sodium chloride flush

## 2020-03-24 NOTE — Progress Notes (Signed)
Patient placed on full vent support due to increased WOB and increased RR.  Patient getting bedside dialysis at this time.

## 2020-03-24 NOTE — Progress Notes (Signed)
[place] Progress Note Patient Name: Wanda Ramirez DOB: March 26, 1971 MRN: [id number]   Date of Service  03/24/2020  HPI/Events of Note  Camera: Discussed with RN, Ander Purpura.  Coughing, waking her up. Getting versed q2 hrly.  precedex getting stopped, on 0.7.   VS stable. Trach.   eICU Interventions  - will keep Precedex gtt on, versed prn . - robitussin via tube prn for cough      Intervention Category Intermediate Interventions: Other:  Elmer Sow 03/24/2020, 9:21 PM

## 2020-03-24 NOTE — Progress Notes (Signed)
Per Dr. Prudencio Burly, may continue Precedex as preivously ordered. MD notified of continued coughing and anxiety and temp of 100.5/diaphoresis.  See orders.

## 2020-03-24 NOTE — Progress Notes (Signed)
NAME:  Wanda Ramirez, MRN:  [id number], DOB:  10/17/1970, LOS: 47 ADMISSION DATE:  02/13/2020, CONSULTATION DATE:  9/24 REFERRING MD:  Heber Newell, CHIEF COMPLAINT:  Dyspnea   Brief History   49 yo female admitted with COVID 19 pneumonia on 9/13, had been diagnosed with COVID on 9/9.  On 9/24 her condition worsened and PCCM was consulted, she was sent to the ICU and treated with BIPAP, then eventually intubated.  Started on CRRT through a tunneled HD cath. Extubated on 10/6 but re-intubated on 10/8.  Has been challenged by sedation needs since admission.   Past Medical History  Sleep apnea Morbid obesity Hypothyroidism Hypertension GERD  End-stage renal disease on HD MWF Diabetes Asthma  Significant Hospital Events   Admitted 9/14 9/24 - Lying in bed on side currently undergoing iHD,  She is lethargic but will arouse to verbal stimuli but quickly falls back to sleep.  RN states patient has had progressive decline over the last 12hrs 9/25 - -currently in medical ICU to [facility].  Bed 12.  She has been on BiPAP with Precedex overnight.  This morning on the low-dose of Precedex she got agitated.?  Also had melena.  She also desaturated.  Nursing then increase her Precedex and since then she has been more stable.  Yesterday dialysis was cut short.  9/26 -unable to run CRRT with prone ventilation.  Resume supine ventilation but still unable to draw blood back. 9/27 transferred to 49m. IR replaced HD catheter 10/1 on CRRT and vasopressors 10/6 - extubated 10/7 - ad some stridor and increased WOB so placed on BIPAP and dexamethasone given.  10/15 weaning sedation. midaz off 10/16 move to 24M 10/18 s/p 6 proximal XLT Shiley tracheostomy  10/19: Resumed Eliquis low dose. Per Dr. Thompson Caul note 10/7 will need to be on [facility] for 1 month then reasses 10/19: Episode of hypoglycemia 55. 10/21 hypoglycemic again. levemir and tubefeed coverage decreased. Low grade temp->sputum culture send had vomiting event  on 10/19 w/ concern for aspiration. Fent was stopped 10/20. Still gets anxious at night. Required increased precedex. Added clonazepam. 10/22 attempting PSV trials  Consults:  PCCM Nephrology  Procedures:  9/23 tunneled HD cath placement >  10/8 ETT >  10/18 6 proximal XLT Shiley tracheostomy   Significant Diagnostic Tests:  9/21 Vas ultrasound> thrombosed LUE fistula 9/21 VQ scan > negative for PE 9/23 echo> LVEF 60-65%, flat ventrcile consistent with RV pressure overload, RV severely enlarged, moderate reduction in RV function  Micro Data:  9/13 blood > neg   9/14 sars cov 2 > pos 9/26 blood > neg 10/8 resp > MSSA 10/8 blood >  10/21 sputum >>>  Antimicrobials:  9/14 remdesivir > 9/19 9/15 tocilizumab > x1 9/20 cefepime > 9/29 9/20 azithro >  9/25 10/8 vanc > 10/9 10/8 zosyn > x1 10/10 ceftriaxone > 10/16  Interim history/subjective:  No distress. Does get anxious easily. On PS trial this morning.  Objective   Blood pressure 101/68, pulse (!) 122, temperature 99.9 F (37.7 C), temperature source Axillary, resp. rate (!) 34, height 5\' 2"  (1.575 m), weight 105.6 kg, SpO2 92 %.    Vent Mode: PSV;CPAP FiO2 (%):  [40 %-50 %] 50 % PEEP:  [8 cmH20] 8 cmH20 Pressure Support:  [10 cmH20-14 cmH20] 10 cmH20   Intake/Output Summary (Last 24 hours) at 03/24/2020 1046 Last data filed at 03/24/2020 0700 Gross per 24 hour  Intake 1537.52 ml  Output 250 ml  Net 1287.52 ml  Filed Weights   03/23/20 0300 03/24/20 0500 03/24/20 0657  Weight: 105.9 kg 105.6 kg 105.6 kg    Examination: General this is a very pleasant 49 year old female. HENT 6 prox xlt trach trach to vent. Pulm Cl dec bases.  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 Still w/ low grade temps There was concern for aspiration on 10/19.  Plan Discontinue central line  ESRD Plan TTHS HD  Hypokalemia  Plan Replace and recheck  Nausea Plan zofran PRN  Acute metabolic encephalopathy w/ on-going need for sedation on mechanical ventilation -anxious but cooperative and appropriate. Fent gtt stopped 10/20. Still on precedex but weaning this daily. She looks much less anxious since adding clonazepam  Plan RASS goal 0 Cont oxy (was home med) Wean precedex. Will try to shut off today.  No change in seroquel Cont clonazepam   Anemia of critical and chronic illness No evidence of current bleeding.  Plan Trend cbc Transfuse for hgb < 7  Hypothyroidism Plan Synthroid   DM2 with both hypo and hyperglycemia Labile Blood sugars Plan Goal 140-180 Cont resistant ssi Cont Levemir 15 bid Will decrease tube feed coverage slightly   Best practice:  Diet: tube feeding Pain/Anxiety/Delirium protocol (if indicated): as above VAP protocol (if indicated): yes DVT prophylaxis: has been on eliquis for high risk of VTE/ resumed anticoagulation   Eliquis X 1 month per Dr. Thompson Caul note 10/7. Will re-assess need to continue GI prophylaxis: Pantoprazole for stress ulcer prophylaxis Glucose control: SSI Mobility: bed rest Code Status: full Family Communication: will update Disposition: remain in ICU  The patient is critically ill with multiple organ systems failure and requires high complexity decision making for assessment and support, frequent evaluation and titration of therapies, application of advanced monitoring technologies and extensive interpretation  of multiple databases.   Critical Care Time devoted to patient care services described in this note is 32 minutes. This time reflects time of care of this [place] . This critical care time does not reflect separately billable procedures or procedure time, teaching time or supervisory time of PA/NP/Med student/Med Resident etc but could involve care discussion time.  [facility]one [facility] 03/24/2020 10:47 AM  Pager: [phone] After hours pager: [phone]

## 2020-03-25 ENCOUNTER — Inpatient Hospital Stay (HOSPITAL_COMMUNITY): Payer: Medicaid Other

## 2020-03-25 DIAGNOSIS — J9601 Acute respiratory failure with hypoxia: Secondary | ICD-10-CM | POA: Diagnosis not present

## 2020-03-25 DIAGNOSIS — N186 End stage renal disease: Secondary | ICD-10-CM | POA: Diagnosis not present

## 2020-03-25 DIAGNOSIS — U071 COVID-19: Secondary | ICD-10-CM | POA: Diagnosis not present

## 2020-03-25 DIAGNOSIS — J80 Acute respiratory distress syndrome: Secondary | ICD-10-CM | POA: Diagnosis not present

## 2020-03-25 LAB — GLUCOSE, CAPILLARY
Glucose-Capillary: 179 mg/dL — ABNORMAL HIGH (ref 70–99)
Glucose-Capillary: 146 mg/dL — ABNORMAL HIGH (ref 70–99)
Glucose-Capillary: 244 mg/dL — ABNORMAL HIGH (ref 70–99)
Glucose-Capillary: 230 mg/dL — ABNORMAL HIGH (ref 70–99)
Glucose-Capillary: 152 mg/dL — ABNORMAL HIGH (ref 70–99)

## 2020-03-25 LAB — CBC WITH DIFFERENTIAL/PLATELET
Abs Immature Granulocytes: 0.23 10*3/uL — ABNORMAL HIGH (ref 0.00–0.07)
Basophils Absolute: 0.1 10*3/uL (ref 0.0–0.1)
Basophils Relative: 0 %
Eosinophils Absolute: 0.3 10*3/uL (ref 0.0–0.5)
Eosinophils Relative: 1 %
HCT: 23.9 % — ABNORMAL LOW (ref 36.0–46.0)
Hemoglobin: 7.5 g/dL — ABNORMAL LOW (ref 12.0–15.0)
Immature Granulocytes: 1 %
Lymphocytes Relative: 8 %
Lymphs Abs: 1.7 10*3/uL (ref 0.7–4.0)
MCH: 29.9 pg (ref 26.0–34.0)
MCHC: 31.4 g/dL (ref 30.0–36.0)
MCV: 95.2 fL (ref 80.0–100.0)
Monocytes Absolute: 1.5 10*3/uL — ABNORMAL HIGH (ref 0.1–1.0)
Monocytes Relative: 6 %
Neutro Abs: 19 10*3/uL — ABNORMAL HIGH (ref 1.7–7.7)
Neutrophils Relative %: 84 %
Platelets: 499 10*3/uL — ABNORMAL HIGH (ref 150–400)
RBC: 2.51 MIL/uL — ABNORMAL LOW (ref 3.87–5.11)
RDW: 17.2 % — ABNORMAL HIGH (ref 11.5–15.5)
WBC: 22.7 10*3/uL — ABNORMAL HIGH (ref 4.0–10.5)
nRBC: 0 % (ref 0.0–0.2)

## 2020-03-25 LAB — MAGNESIUM: Magnesium: 2.3 mg/dL (ref 1.7–2.4)

## 2020-03-25 LAB — BASIC METABOLIC PANEL
Anion gap: 12 (ref 5–15)
BUN: 37 mg/dL — ABNORMAL HIGH (ref 6–20)
CO2: 26 mmol/L (ref 22–32)
Calcium: 9.5 mg/dL (ref 8.9–10.3)
Chloride: 97 mmol/L — ABNORMAL LOW (ref 98–111)
Creatinine, Ser: 4.3 mg/dL — ABNORMAL HIGH (ref 0.44–1.00)
GFR, Estimated: 12 mL/min — ABNORMAL LOW (ref >60)
Glucose, Bld: 153 mg/dL — ABNORMAL HIGH (ref 70–99)
Potassium: 3.8 mmol/L (ref 3.5–5.1)
Sodium: 135 mmol/L (ref 135–145)

## 2020-03-25 LAB — PHOSPHORUS: Phosphorus: 4.6 mg/dL (ref 2.5–4.6)

## 2020-03-25 LAB — CALCIUM, IONIZED: Calcium, Ionized, Serum: 5.3 mg/dL (ref 4.5–5.6)

## 2020-03-25 MED ORDER — QUETIAPINE FUMARATE 100 MG PO TABS
100.0000 mg | ORAL_TABLET | Freq: Two times a day (BID) | ORAL | Status: DC
  Administered 2020-03-25 – 2020-04-19 (×50): 100 mg
  Filled 2020-03-25 (×14): qty 1
  Filled 2020-03-25: qty 2
  Filled 2020-03-25 (×36): qty 1

## 2020-03-25 MED ORDER — ALBUTEROL SULFATE (2.5 MG/3ML) 0.083% IN NEBU
INHALATION_SOLUTION | RESPIRATORY_TRACT | Status: AC
  Administered 2020-03-25: 2.5 mg
  Filled 2020-03-25: qty 3

## 2020-03-25 MED ORDER — CLONAZEPAM 0.5 MG PO TBDP
2.0000 mg | ORAL_TABLET | Freq: Two times a day (BID) | ORAL | Status: DC
  Administered 2020-03-25 – 2020-03-26 (×4): 2 mg
  Filled 2020-03-25 (×4): qty 4

## 2020-03-25 NOTE — Progress Notes (Addendum)
[place] Progress Note Patient Name: OONA TRAMMEL DOB: September 19, 1970 MRN: [id number]   Date of Service  03/25/2020  HPI/Events of Note  Fever on going for last 72 hrs, now at 103. . Wbc stable at 18 K. Off of abx courses. Tarch. Cultures neg. From 34 th.  Asking for cooling blanket. HD cath. PICC /central removed recently. Not on abx on chart review. MAP is good.   eICU Interventions  - Blood culture x 2. Stat CBC, LA and CxR for any new infection. No antibiotics ordered yet.  - cooling blanket ordered.  No abx for now.      Intervention Category Intermediate Interventions: Other:;Infection - evaluation and management  Elmer Sow 03/25/2020, 11:06 PM

## 2020-03-25 NOTE — Progress Notes (Signed)
Assisted tele visit to patient with family member.  Dajion Bickford Harold, RN  

## 2020-03-25 NOTE — Progress Notes (Signed)
[facility] Progress Note Patient Name: Wanda Ramirez DOB: Jul 24, 1970 MRN: [id number]   Date of Service  03/25/2020  HPI/Events of Note  Arythmia, no RVR. On apixaban.   eICU Interventions  BMP/mag/ion calcium, po4 ordered. Follow Hg also.      Intervention Category Intermediate Interventions: Arrhythmia - evaluation and management  Elmer Sow 03/25/2020, 6:23 AM

## 2020-03-25 NOTE — Progress Notes (Signed)
[facility] Progress Note Patient Name: Wanda Ramirez DOB: September 25, 1970 MRN: [id number]   Date of Service  03/25/2020  HPI/Events of Note  Camera: Precedex making soft MAP, so decreasing gtt but making her more anxious, trying to pull trach. In synchrony, Vitals stable.  Asking for restraints. Got prn Versed.   eICU Interventions  - Non violent, soft bilateral wrist restraints ordered to prevent self injury and harm.   Discussed with RN.      Intervention Category Intermediate Interventions: Other:  Elmer Sow 03/25/2020, 3:20 AM

## 2020-03-25 NOTE — Progress Notes (Addendum)
NAME:  Wanda Ramirez, MRN:  [id number], DOB:  1970-07-14, LOS: 67 ADMISSION DATE:  02/13/2020, CONSULTATION DATE:  9/24 REFERRING MD:  [place] Com[place], CHIEF COMPLAINT:  Dyspnea   Brief History   49 yo female admitted with COVID 19 pneumonia on 9/13, had been diagnosed with COVID on 9/9.  On 9/24 her condition worsened and PCCM was consulted, she was sent to the ICU and treated with BIPAP, then eventually intubated.  Started on CRRT through a tunneled HD cath. Extubated on 10/6 but re-intubated on 10/8.  Has been challenged by sedation needs since admission.   Past Medical History  Sleep apnea Morbid obesity Hypothyroidism Hypertension GERD  End-stage renal disease on HD MWF Diabetes Asthma  Significant Hospital Events   Admitted 9/14 9/24 - Lying in bed on side currently undergoing iHD,  She is lethargic but will arouse to verbal stimuli but quickly falls back to sleep.  RN states patient has had progressive decline over the last 12hrs 9/25 - -currently in medical ICU to [facility].  Bed 12.  She has been on BiPAP with Precedex overnight.  This morning on the low-dose of Precedex she got agitated.?  Also had melena.  She also desaturated.  Nursing then increase her Precedex and since then she has been more stable.  Yesterday dialysis was cut short.  9/26 -unable to run CRRT with prone ventilation.  Resume supine ventilation but still unable to draw blood back. 9/27 transferred to 24m. IR replaced HD catheter 10/1 on CRRT and vasopressors 10/6 - extubated 10/7 - ad some stridor and increased WOB so placed on BIPAP and dexamethasone given.  10/15 weaning sedation. midaz off 10/16 move to 57M 10/18 s/p 6 proximal XLT Shiley tracheostomy  10/19: Resumed Eliquis low dose. Per Dr. Thompson Caul note 10/7 will need to be on [facility] for 1 month then reasses 10/19: Episode of hypoglycemia 55. 10/21 hypoglycemic again. levemir and tubefeed coverage decreased. Low grade temp->sputum culture send had vomiting event  on 10/19 w/ concern for aspiration. Fent was stopped 10/20. Still gets anxious at night. Required increased precedex. Added clonazepam. 10/22 attempting PSV trials  Consults:  PCCM Nephrology  Procedures:  9/23 tunneled HD cath placement >  10/8 ETT >  10/18 6 proximal XLT Shiley tracheostomy   Significant Diagnostic Tests:  9/21 Vas ultrasound> thrombosed LUE fistula 9/21 VQ scan > negative for PE 9/23 echo> LVEF 60-65%, flat ventrcile consistent with RV pressure overload, RV severely enlarged, moderate reduction in RV function  Micro Data:  9/13 blood > neg   9/14 sars cov 2 > pos 9/26 blood > neg 10/8 resp > MSSA 10/8 blood >  10/21 sputum >>>  Antimicrobials:  9/14 remdesivir > 9/19 9/15 tocilizumab > x1 9/20 cefepime > 9/29 9/20 azithro >  9/25 10/8 vanc > 10/9 10/8 zosyn > x1 10/10 ceftriaxone > 10/16  Interim history/subjective:  Not tolerating any weaning sedation.  Now on higher FiO2. Coughing frequently with thick secretions.   Objective   Blood pressure (!) 144/81, pulse (!) 129, temperature 99.7 F (37.6 C), temperature source Axillary, resp. rate (!) 32, height 5\' 2"  (1.575 m), weight 105.5 kg, SpO2 93 %.    Vent Mode: PRVC FiO2 (%):  [50 %-60 %] 60 % Set Rate:  [18 bmp] 18 bmp Vt Set:  [400 mL] 400 mL PEEP:  [8 cmH20-10 cmH20] 10 cmH20 Plateau Pressure:  [18 cmH20] 18 cmH20   Intake/Output Summary (Last 24 hours) at 03/25/2020 1950 Last data filed at  03/25/2020 0900 Gross per 24 hour  Intake 1976.64 ml  Output 2550 ml  Net -573.36 ml   Filed Weights   03/24/20 0657 03/24/20 1057 03/25/20 0500  Weight: 105.6 kg 103.6 kg 105.5 kg    Examination: General: ETT to vent. Sedated. HENT 6 prox xlt trach trach to vent. Pulm Cl dec bases.  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   Plan Cont daily assessment for weaning. Wean vent settings as tolerated.  SLP to initiate in-line PVS PT ordered to assist w/ mobilization Volume removal w/ HD Am cxr VAP bundle  Routine trach care She is having frequent coughing, wonder if a regular shiley rather than XLT may be more comfortable for her.   ESRD Plan TTHS HD  Hypokalemia  Plan Replace and recheck  Nausea Plan zofran PRN  Acute metabolic encephalopathy w/ on-going need for sedation on mechanical ventilation This is an ongoing challenging issue. Plan RASS goal 0. Cont oxy (was home med) Unable to tolerate wean precedex yet.  Will increase clonazepam and seroquel today to 2mg  BID and 100 mg BID  Anemia of critical and chronic illness No evidence of current bleeding.  Plan Trend cbc Transfuse for hgb < 7  Hypothyroidism Plan Synthroid   DM2 with both hypo and hyperglycemia Labile Blood sugars Plan Goal 140-180 Cont resistant ssi Cont Levemir 15 bid Will decrease tube feed coverage slightly   Best practice:  Diet: tube feeding Pain/Anxiety/Delirium protocol (if indicated): as above VAP protocol (if indicated): yes DVT prophylaxis: has been on eliquis for high risk of VTE/ resumed anticoagulation   Eliquis X 1 month per Dr. Thompson Caul note 10/7. Will re-assess need to continue GI prophylaxis: Pantoprazole for stress ulcer prophylaxis Glucose control: SSI Mobility: bed rest Code Status: full Family Communication: updated Tiona over the phone on 10/23 at 5pm. Will update again today.  Disposition: remain in ICU  The patient is critically ill with multiple organ systems failure and requires high complexity decision making for assessment and support, frequent evaluation and titration of therapies, application of advanced monitoring technologies and extensive interpretation of multiple databases.   Critical Care Time devoted to  patient care services described in this note is 31 minutes. This time reflects time of care of this [place] . This critical care time does not reflect separately billable procedures or procedure time, teaching time or supervisory time of PA/NP/Med student/Med Resident etc but could involve care discussion time.  [facility] 03/25/2020 9:56 AM  Pager: [phone] After hours pager: [phone]

## 2020-03-25 NOTE — Progress Notes (Signed)
Patient continues to pull at trach and has removed self from ventilator several times. Educated patient, safety mittens placed. Precedex decreased due to blood pressure decreases. Strong cough and anxiety continues. [place] notified.

## 2020-03-25 NOTE — Progress Notes (Signed)
[facility] Progress Note  Subjective: pt seen in ICU.   Vitals:   03/25/20 0800 03/25/20 0900 03/25/20 1000 03/25/20 1120  BP: (!) 144/81 (!) 120/51 (!) 106/56   Pulse: (!) 129 (!) 132 (!) 121   Resp: (!) 32 (!) 24 (!) 25   Temp:    (!) 101.4 F (38.6 C)  TempSrc:    Axillary  SpO2: 93% 97% 99% 100%  Weight:      Height:        Exam: More alert, NG in , +trach  CVS- RRR without murmurs rubs or gallops RS- CTA ventilator dependent ETT tube ABD- BS present soft non-distended   EXT- no edema LE's    OP HD: TTS 4h 30mn 450/800 2/2.25 bath 111.5kg Hep 2000 L AVF - darbe 50 ug q week, last 9/7 - calc 1.0 tiw - 9/9 Hb 10.0, tsat 22%   Summary: 49yo female w/ ESRD on TTS HD admitted for COVID PNA on 02/13/20. She was intubated. She had CRRT around 9/26- 10/7.  Now is on intermittent HD. She is sp trach. She had MSSA pna. DM2.   Assessment/ Plan: 1. COVID pna /sp trach - on vent, attempting to wean 2. MSSA HCAP/ VAP - finished course of abx.  3. ESRD - sp CRRT 9/26- 10/7. Now back on iHD. Next HD Tuesday. 4. BP/ volume - no pressors or BP lowering meds. About 7kg under dry wt, euvolemic on exam. Can prob get volume a bit more this week.  5. HD access: her LUA AVF clotted while here, will need new perm access at some point when more stable. TDC x2 here by IR.  6. 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 DM2 - per pmd     Rob Kalil Woessner 03/25/2020, 1:13 PM   Recent Labs  Lab 03/23/20 0455 03/23/20 0455 03/24/20 0551 03/24/20 1120 03/25/20 0650  K 3.1*   < >  --  3.6 3.8  BUN 49*   < >  --  19 37*  CREATININE 4.81*   < >  --  2.43* 4.30*  CALCIUM 9.5   < >  --  8.3* 9.5  PHOS  --   --   --   --  4.6  HGB 7.4*  --  7.3*  --   --    < > = values in this interval not displayed.   Inpatient medications: . apixaban  2.5 mg Per Tube BID  . B-complex with vitamin  C  1 tablet Per Tube Daily  . chlorhexidine gluconate (MEDLINE KIT)  15 mL Mouth Rinse BID  . Chlorhexidine Gluconate Cloth  6 each Topical Q0600  . clonazepam  2 mg Per Tube BID  . darbepoetin (ARANESP) injection - DIALYSIS  150 mcg Intravenous Q Tue-HD  . docusate  100 mg Per Tube BID  . feeding supplement (PROSource TF)  45 mL Per Tube BID  . fiber  1 packet Per Tube BID  . insulin aspart  0-20 Units Subcutaneous Q4H  . insulin aspart  5 Units Subcutaneous Q4H  . insulin detemir  12 Units Subcutaneous BID  . levothyroxine  125 mcg Per Tube Q0600  . mouth rinse  15 mL Mouth Rinse 10 times per day  . oxyCODONE  10 mg Per Tube Q6H  . pantoprazole sodium  40 mg Per Tube BID  . polyethylene glycol  17 g Per Tube Daily  . QUEtiapine  100 mg Per Tube BID  . sodium chloride flush  10-40 mL Intracatheter Q12H  . sodium chloride flush  3 mL Intravenous Q12H   . sodium chloride Stopped (03/11/20 1509)  . sodium chloride    . sodium chloride    . sodium chloride    . dexmedetomidine (PRECEDEX) IV infusion 0.3 mcg/kg/hr (03/25/20 1014)  . dextrose    . feeding supplement (NEPRO CARB STEADY) 55 mL/hr at 03/25/20 0300   sodium chloride, sodium chloride, sodium chloride, sodium chloride, acetaminophen (TYLENOL) oral liquid 160 mg/5 mL, albuterol, alteplase, dextrose, guaiFENesin, heparin, heparin, heparin, influenza vac split quadrivalent PF, lidocaine (PF), lidocaine-prilocaine, lip balm, midazolam, ondansetron (ZOFRAN) IV, pentafluoroprop-tetrafluoroeth, sodium chloride flush, sodium chloride flush

## 2020-03-25 NOTE — Progress Notes (Addendum)
RT note-Patient continues to cough uncontrollably, albuterol nebulizer given, secretions are very thick and foamy, yellow and tan, PEEP and fio2 increased due to sp02 86%.

## 2020-03-25 NOTE — Plan of Care (Signed)
  Problem: Education: Goal: Knowledge of risk factors and measures for prevention of condition will improve Outcome: Progressing   Problem: Coping: Goal: Psychosocial and spiritual needs will be supported Outcome: Progressing   Problem: Respiratory: Goal: Will maintain a patent airway Outcome: Progressing Goal: Complications related to the disease process, condition or treatment will be avoided or minimized Outcome: Progressing   Problem: Education: Goal: Knowledge of General Education information will improve Description: Including pain rating scale, medication(s)/side effects and non-pharmacologic comfort measures Outcome: Progressing   Problem: Health Behavior/Discharge Planning: Goal: Ability to manage health-related needs will improve Outcome: Progressing   Problem: Clinical Measurements: Goal: Ability to maintain clinical measurements within normal limits will improve Outcome: Progressing Goal: Will remain free from infection Outcome: Progressing Goal: Diagnostic test results will improve Outcome: Progressing Goal: Respiratory complications will improve Outcome: Progressing Goal: Cardiovascular complication will be avoided Outcome: Progressing   Problem: Activity: Goal: Risk for activity intolerance will decrease Outcome: Progressing   Problem: Nutrition: Goal: Adequate nutrition will be maintained Outcome: Progressing   Problem: Coping: Goal: Level of anxiety will decrease Outcome: Progressing   Problem: Elimination: Goal: Will not experience complications related to bowel motility Outcome: Progressing   Problem: Pain Managment: Goal: General experience of comfort will improve Outcome: Progressing   Problem: Safety: Goal: Ability to remain free from injury will improve Outcome: Progressing   Problem: Skin Integrity: Goal: Risk for impaired skin integrity will decrease Outcome: Progressing   Problem: Activity: Goal: Ability to tolerate increased  activity will improve Outcome: Progressing   Problem: Respiratory: Goal: Ability to maintain a clear airway and adequate ventilation will improve Outcome: Progressing   Problem: Role Relationship: Goal: Method of communication will improve Outcome: Progressing   Problem: Education: Goal: Knowledge of disease and its progression will improve Outcome: Progressing Goal: Individualized Educational Video(s) Outcome: Progressing   Problem: Fluid Volume: Goal: Compliance with measures to maintain balanced fluid volume will improve Outcome: Progressing   Problem: Health Behavior/Discharge Planning: Goal: Ability to manage health-related needs will improve Outcome: Progressing   Problem: Nutritional: Goal: Ability to make healthy dietary choices will improve Outcome: Progressing   Problem: Clinical Measurements: Goal: Complications related to the disease process, condition or treatment will be avoided or minimized Outcome: Progressing   Problem: Activity: Goal: Ability to tolerate increased activity will improve Outcome: Progressing   Problem: Respiratory: Goal: Ability to maintain a clear airway and adequate ventilation will improve Outcome: Progressing   Problem: Role Relationship: Goal: Method of communication will improve Outcome: Progressing

## 2020-03-26 DIAGNOSIS — U071 COVID-19: Secondary | ICD-10-CM | POA: Diagnosis not present

## 2020-03-26 DIAGNOSIS — J9601 Acute respiratory failure with hypoxia: Secondary | ICD-10-CM | POA: Diagnosis not present

## 2020-03-26 LAB — BASIC METABOLIC PANEL
Anion gap: 14 (ref 5–15)
BUN: 52 mg/dL — ABNORMAL HIGH (ref 6–20)
CO2: 24 mmol/L (ref 22–32)
Calcium: 9.8 mg/dL (ref 8.9–10.3)
Chloride: 97 mmol/L — ABNORMAL LOW (ref 98–111)
Creatinine, Ser: 5.92 mg/dL — ABNORMAL HIGH (ref 0.44–1.00)
GFR, Estimated: 8 mL/min — ABNORMAL LOW (ref >60)
Glucose, Bld: 184 mg/dL — ABNORMAL HIGH (ref 70–99)
Potassium: 4.4 mmol/L (ref 3.5–5.1)
Sodium: 135 mmol/L (ref 135–145)

## 2020-03-26 LAB — GLUCOSE, CAPILLARY
Glucose-Capillary: 102 mg/dL — ABNORMAL HIGH (ref 70–99)
Glucose-Capillary: 117 mg/dL — ABNORMAL HIGH (ref 70–99)
Glucose-Capillary: 123 mg/dL — ABNORMAL HIGH (ref 70–99)
Glucose-Capillary: 138 mg/dL — ABNORMAL HIGH (ref 70–99)
Glucose-Capillary: 154 mg/dL — ABNORMAL HIGH (ref 70–99)
Glucose-Capillary: 157 mg/dL — ABNORMAL HIGH (ref 70–99)
Glucose-Capillary: 161 mg/dL — ABNORMAL HIGH (ref 70–99)

## 2020-03-26 LAB — PROCALCITONIN: Procalcitonin: 15.73 ng/mL

## 2020-03-26 LAB — LACTIC ACID, PLASMA
Lactic Acid, Venous: 1.6 mmol/L (ref 0.5–1.9)
Lactic Acid, Venous: 1.6 mmol/L (ref 0.5–1.9)

## 2020-03-26 MED ORDER — COLLAGENASE 250 UNIT/GM EX OINT
TOPICAL_OINTMENT | Freq: Every day | CUTANEOUS | Status: DC
  Administered 2020-03-27: 1 via TOPICAL
  Filled 2020-03-26 (×2): qty 30

## 2020-03-26 MED ORDER — ALBUTEROL SULFATE (2.5 MG/3ML) 0.083% IN NEBU
2.5000 mg | INHALATION_SOLUTION | RESPIRATORY_TRACT | Status: DC | PRN
  Administered 2020-03-26 – 2020-05-04 (×5): 2.5 mg via RESPIRATORY_TRACT
  Filled 2020-03-26 (×4): qty 3

## 2020-03-26 MED ORDER — HYDROCERIN EX CREA
TOPICAL_CREAM | Freq: Every day | CUTANEOUS | Status: DC
  Administered 2020-03-27: 1 via TOPICAL
  Filled 2020-03-26 (×3): qty 113

## 2020-03-26 NOTE — Progress Notes (Signed)
Physical Therapy Treatment Patient Details Name: Wanda Ramirez MRN: [id number] DOB: 04-13-1971 Today's Date: 03/26/2020    History of Present Illness 49 yo admitted 9/13 with Covid PNA, intubated 9/24-10/6, reintubated 10/8, CRRT 9/26-10/7, trach and bronch 10/18. PMhx: ESRD TTS, obesity, HTN, GERD, hypothyroidism, DM, asthma    PT Comments    Pt with flat affect and decreased attention needing consistent redirection to task and cues. Pt with improved strength and function able to assist with bil LE HEP and progress to sitting EOB and standing trials this session. Will continue to follow to maximize function.   SpO2 100% on 40% fIO2 PRVC    Follow Up Recommendations  LTACH;Supervision/Assistance - 24 hour;CIR     Equipment Recommendations  Other (comment) (TBD)    Recommendations for Other Services       Precautions / Restrictions Precautions Precautions: Fall Precaution Comments: trach, vent, cortrak, flexiseal Restrictions Weight Bearing Restrictions: No    Mobility  Bed Mobility Overal bed mobility: Needs Assistance Bed Mobility: Supine to Sit;Sit to Supine     Supine to sit: Max assist;+2 for physical assistance Sit to supine: Max assist;+2 for physical assistance   General bed mobility comments: physical assist to move legs toward EOB, rotate trunk with cues to look for and grasp railing. Assist to lift trunk from surface. Return to bed with complete assist to bring legs to surface and control trunk  Transfers Overall transfer level: Needs assistance   Transfers: Sit to/from Stand Sit to Stand: Max assist;+2 physical assistance         General transfer comment: max +2 assist to stand x 3 trials with bil knees blocked and use of pad to craddle sacrum along with belt. pt with shift toward [facility] with pad x 2 trials without ability to step  Ambulation/Gait                 Stairs             Wheelchair Mobility    Modified Rankin (Stroke  Patients Only)       Balance Overall balance assessment: Needs assistance Sitting-balance support: Bilateral upper extremity supported Sitting balance-Leahy Scale: Poor Sitting balance - Comments: bil UE support on mattress with cues for trunk positioning, midline posture and correction to sitting. pt sat EOB 10 min with minguard-mod assist highly variable within timeframe and limited by decreased attention                                    Cognition Arousal/Alertness: Awake/alert Behavior During Therapy: Flat affect Overall Cognitive Status: Impaired/Different from baseline Area of Impairment: Safety/judgement;Attention;Following commands                   Current Attention Level: Focused   Following Commands: Follows one step commands inconsistently;Follows one step commands with increased time Safety/Judgement: Decreased awareness of deficits;Decreased awareness of safety     General Comments: pt with decreased attention grossly 10 sec at a time and needs redirection to task. able to locate calendar to state date appropriately      Exercises General Exercises - Lower Extremity Heel Slides: Both;10 reps;AAROM;Supine Straight Leg Raises: AAROM;Both;Supine;10 reps    General Comments        Pertinent Vitals/Pain Pain Score: 3  Pain Location: pt with grimace with right knee flexion Pain Descriptors / Indicators: Grimacing Pain Intervention(s): Monitored during session;Repositioned    Home Living  Prior Function            PT Goals (current goals can now be found in the care plan section) Progress towards PT goals: Progressing toward goals    Frequency    Min 3X/week      PT Plan Current plan remains appropriate    Co-evaluation PT/OT/SLP Co-Evaluation/Treatment: Yes Reason for Co-Treatment: Complexity of the patient's impairments (multi-system involvement);For patient/therapist safety PT goals  addressed during session: Mobility/safety with mobility;Strengthening/ROM;Balance        AM-PAC PT "6 Clicks" Mobility   Outcome Measure  Help needed turning from your back to your side while in a flat bed without using bedrails?: A Lot Help needed moving from lying on your back to sitting on the side of a flat bed without using bedrails?: Total Help needed moving to and from a bed to a chair (including a wheelchair)?: Total Help needed standing up from a chair using your arms (e.g., wheelchair or bedside chair)?: Total Help needed to walk in hospital room?: Total Help needed climbing 3-5 steps with a railing? : Total 6 Click Score: 7    End of Session Equipment Utilized During Treatment: Gait belt Activity Tolerance: Patient tolerated treatment well Patient left: in bed;with call bell/phone within reach Nurse Communication: Mobility status;Need for lift equipment PT Visit Diagnosis: Other abnormalities of gait and mobility (R26.89);Muscle weakness (generalized) (M62.81);Other symptoms and signs involving the nervous system (X32.440)     Time: 1027-2536 PT Time Calculation (min) (ACUTE ONLY): 28 min  Charges:                        Bayard Males, PT Acute Rehabilitation Services Pager: [phone] Office: [phone]    Sela Falk B Caisley [place] 03/26/2020, 9:40 AM

## 2020-03-26 NOTE — Progress Notes (Signed)
[place] Progress Note Patient Name: Wanda Ramirez DOB: [birth year] MRN: [id number]   Date of Service  03/26/2020  HPI/Events of Note  Copious respiratory secretions of medium consistency. Already on Guaifenesin.   eICU Interventions  Plan: 1. Albuterol 2.5 mg via neb Q 3 hours PRN SOB, wheezing or secretions.      Intervention Category Major Interventions: Other:  Lysle Dingwall 03/26/2020, 9:03 PM

## 2020-03-26 NOTE — Evaluation (Signed)
Occupational Therapy Evaluation Patient Details Name: Wanda Ramirez MRN: [id number] DOB: 1970-12-15 Today's Date: 03/26/2020    History of Present Illness 49 yo admitted 9/13 with Covid PNA, intubated 9/24-10/6, reintubated 10/8, CRRT 9/26-10/7, trach and bronch 10/18. PMhx: ESRD TTS, obesity, HTN, GERD, hypothyroidism, DM, asthma   Clinical Impression   Pt presents in bed with PT present. PTA, pt was independent. Due to cognitive limitations and physical limitations, she currently requires maxA+2 to progress to sitting EOB and requires minguard to moderate assistance for stability/postural awareness sitting EOB. Pt demonstrates focused attention requiring redirection and cues every 10seconds, following commands inconsistently and with increased time. Pt SpO2 100% on 40% fIO2 PRVC. Pt will continue to benefit from skilled OT services to maximize safety and independence with ADL/IADL and functional mobility. Will continue to follow acutely and progress as tolerated. Currently recommend LTACH vs CIR pending pt's medical stability and progression.      Follow Up Recommendations  LTACH;CIR (pending medical status)    Equipment Recommendations  Other (comment) (TBD)    Recommendations for Other Services       Precautions / Restrictions Precautions Precautions: Fall Precaution Comments: trach, vent, cortrak, flexiseal Restrictions Weight Bearing Restrictions: No      Mobility Bed Mobility Overal bed mobility: Needs Assistance Bed Mobility: Supine to Sit;Sit to Supine Rolling: Mod assist   Supine to sit: Max assist;+2 for physical assistance Sit to supine: Max assist;+2 for physical assistance   General bed mobility comments: physical assist to move legs toward EOB, rotate trunk with cues to look for and grasp railing. Assist to lift trunk from surface. Return to bed with complete assist to bring legs to surface and control trunk    Transfers Overall transfer level: Needs  assistance   Transfers: Sit to/from Stand Sit to Stand: Max assist;+2 physical assistance         General transfer comment: max +2 assist to stand x 3 trials with bil knees blocked and use of pad to craddle sacrum along with belt. pt with shift toward [facility] with pad x 2 trials without ability to step    Balance Overall balance assessment: Needs assistance Sitting-balance support: Bilateral upper extremity supported Sitting balance-Leahy Scale: Poor Sitting balance - Comments: bil UE support on mattress with cues for trunk positioning, midline posture and correction to sitting. pt sat EOB 10 min with minguard-mod assist highly variable within timeframe and limited by decreased attention   Standing balance support: Bilateral upper extremity supported Standing balance-Leahy Scale: Zero Standing balance comment: maxA+2 with attempts for sit<>stand                           ADL either performed or assessed with clinical judgement   ADL Overall ADL's : Needs assistance/impaired Eating/Feeding: NPO   Grooming: Maximal assistance;Sitting;Bed level   Upper Body Bathing: Maximal assistance;Sitting;Bed level   Lower Body Bathing: Maximal assistance;+2 for physical assistance;+2 for safety/equipment;Sitting/lateral leans   Upper Body Dressing : Maximal assistance;Sitting   Lower Body Dressing: Maximal assistance;+2 for physical assistance;+2 for safety/equipment;Sitting/lateral leans     Toilet Transfer Details (indicate cue type and reason): did not assess   Toileting - Clothing Manipulation Details (indicate cue type and reason): did not assess       General ADL Comments: pt sat EOB for about 10 minutes with frequent cues for postural adjustments, while sitting worked on command following (touching head with UE), limited in ADL secondary to mobility limitations  and cognition limitations     Vision   Additional Comments: will continue to assess, limited due to attention  span     Perception     Praxis      Pertinent Vitals/Pain Pain Assessment: No/denies pain Pain Score: 3  Pain Location: pt with grimace with right knee flexion Pain Descriptors / Indicators: Grimacing Pain Intervention(s): Monitored during session     Hand Dominance     Extremity/Trunk Assessment Upper Extremity Assessment Upper Extremity Assessment: RUE deficits/detail RUE Deficits / Details: decreased shoulder flexion ~150* AAROM;full elbow flexion/extension;shoulder flexion 2/5;elbow flexion/extension 3-/5;grip strength 3/5  RUE Coordination: decreased fine motor;decreased gross motor LUE Deficits / Details: decreased shoulder flexion ~130* AAROM;full elbow flexion/extension;shoulder flexion 2/5;elbow flexion/extension 3-/5;grip strength 3/5    Lower Extremity Assessment Lower Extremity Assessment: Defer to PT evaluation   Cervical / Trunk Assessment Cervical / Trunk Assessment: Kyphotic   Communication Communication Communication: Tracheostomy   Cognition Arousal/Alertness: Awake/alert Behavior During Therapy: Flat affect Overall Cognitive Status: Impaired/Different from baseline Area of Impairment: Safety/judgement;Attention;Following commands                   Current Attention Level: Focused   Following Commands: Follows one step commands inconsistently;Follows one step commands with increased time Safety/Judgement: Decreased awareness of deficits;Decreased awareness of safety     General Comments: pt with decreased attention ~10seconds at a time;pt required frequent cueing for sequencing during mobility and for postural corrections sitting EOB;pt responding to yes/no questions by nodding her head;difficult to fully assess secondary to trach   General Comments       Exercises General Exercises - Upper Extremity Shoulder Flexion: PROM;Both;Seated;10 reps Elbow Flexion: PROM;Both;Seated;10 reps Elbow Extension: PROM;Both;Seated;10 reps General  Exercises - Lower Extremity Heel Slides: Both;10 reps;AAROM;Supine Straight Leg Raises: AAROM;Both;Supine;10 reps   Shoulder Instructions      Home Living Family/patient expects to be discharged to:: Private residence Living Arrangements: Children Available Help at Discharge: Family;Available PRN/intermittently Type of Home: Apartment Home Access: Level entry     Home Layout: Two level;Bed/bath upstairs;1/2 bath on main level     Bathroom Shower/Tub: Teacher, early years/pre: Standard     Home Equipment: None   Additional Comments: information gathered in chart      Prior Functioning/Environment Level of Independence: Independent        Comments: pt normally cares for herself and drives to/from HD. Pt lives with son who works and has grandchildren        OT Problem List: Decreased strength;Decreased activity tolerance;Decreased range of motion;Impaired balance (sitting and/or standing);Decreased coordination;Decreased cognition;Decreased safety awareness;Decreased knowledge of use of DME or AE;Cardiopulmonary status limiting activity;Obesity;Impaired UE functional use      OT Treatment/Interventions: Self-care/ADL training;Therapeutic exercise;Energy conservation;DME and/or AE instruction;Therapeutic activities;Cognitive remediation/compensation;Patient/family education;Balance training    OT Goals(Current goals can be found in the care plan section) Acute Rehab OT Goals Patient Stated Goal: to return to level of independence OT Goal Formulation: Patient unable to participate in goal setting Time For Goal Achievement: 04/09/20 Potential to Achieve Goals: Good ADL Goals Pt Will Perform Grooming: with min guard assist;sitting Pt Will Perform Upper Body Dressing: with min guard assist;sitting Pt Will Transfer to Toilet: with min assist;stand pivot transfer Additional ADL Goal #1: Pt will progress to alternating attention for safe completion of ADL/IADL.  OT  Frequency: Min 3X/week   Barriers to D/C:            Co-evaluation PT/OT/SLP Co-Evaluation/Treatment: Yes Reason for Co-Treatment: Complexity  of the patient's impairments (multi-system involvement);For patient/therapist safety;To address functional/ADL transfers PT goals addressed during session: Mobility/safety with mobility;Strengthening/ROM;Balance OT goals addressed during session: ADL's and self-care      AM-PAC OT "6 Clicks" Daily Activity     Outcome Measure Help from another person eating meals?: Total Help from another person taking care of personal grooming?: A Lot Help from another person toileting, which includes using toliet, bedpan, or urinal?: Total Help from another person bathing (including washing, rinsing, drying)?: A Lot Help from another person to put on and taking off regular upper body clothing?: A Lot Help from another person to put on and taking off regular lower body clothing?: A Lot 6 Click Score: 10   End of Session Equipment Utilized During Treatment: Gait belt;Oxygen Nurse Communication: Mobility status  Activity Tolerance: Patient tolerated treatment well Patient left: in bed;with call bell/phone within reach;with bed alarm set;Other (comment) (bed in chair position, BUE elevated)  OT Visit Diagnosis: Unsteadiness on feet (R26.81);Other abnormalities of gait and mobility (R26.89);Muscle weakness (generalized) (M62.81);Feeding difficulties (R63.3);Other symptoms and signs involving cognitive function                Time: 4944-9675 OT Time Calculation (min): 23 min Charges:  OT General Charges $OT Visit: 1 Visit OT Evaluation $OT Eval High Complexity: 1 High OT Treatments $Self Care/Home Management : 8-22 mins  Helene Kelp OTR/L Acute Rehabilitation Services Office: [phone]   Wyn Forster 03/26/2020, 10:23 AM

## 2020-03-26 NOTE — Progress Notes (Signed)
[facility] Progress Note  Subjective: pt seen in ICU.   Vitals:   03/26/20 1030 03/26/20 1100 03/26/20 1124 03/26/20 1200  BP: 105/64 (!) 84/61  120/62  Pulse: (!) 113 (!) 104 (!) 114 (!) 113  Resp: (!) '21 19  19  ' Temp:    98.6 F (37 C)  TempSrc:    Core  SpO2: 94% 100% 100% 97%  Weight:      Height:        Exam: More alert, NG in , +trach  CVS- RRR without murmurs rubs or gallops RS- CTA ventilator dependent ETT tube ABD- BS present soft non-distended   EXT- no edema LE's    OP HD: TTS 4h 4mn 450/800 2/2.25 bath 111.5kg Hep 2000 L AVF - darbe 50 ug q week, last 9/7 - calc 1.0 tiw - 9/9 Hb 10.0, tsat 22%   Summary: 49yo female w/ ESRD on TTS HD admitted for COVID PNA on 02/13/20. She was intubated. She had CRRT around 9/26- 10/7.  Now is on intermittent HD. She is sp trach. She had MSSA pna. DM2.   Assessment/ Plan: 1. COVID pna /sp trach - on vent, attempting to wean 2. MSSA HCAP/ VAP - finished course of abx.  3. ESRD - sp CRRT 9/26- 10/7. Now back on iHD. HD tuesday 4. BP/ volume - no pressors or BP lowering meds. About 7kg under dry wt, euvolemic on exam. 2-3 L UF next HD.  5. HD access: her LUA AVF clotted while here, will need new perm access at some point when more stable. TDC x2 here by IR.  6. 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 DM2 - per pmd     Rob Dinnis Rog 03/26/2020, 1:17 PM   Recent Labs  Lab 03/24/20 0551 03/24/20 1120 03/25/20 0650 03/25/20 2343 03/26/20 0226  K  --    < > 3.8  --  4.4  BUN  --    < > 37*  --  52*  CREATININE  --    < > 4.30*  --  5.92*  CALCIUM  --    < > 9.5  --  9.8  PHOS  --   --  4.6  --   --   HGB 7.3*  --   --  7.5*  --    < > = values in this interval not displayed.   Inpatient medications: . apixaban  2.5 mg Per Tube BID  . B-complex with vitamin C  1 tablet Per Tube Daily  . chlorhexidine  gluconate (MEDLINE KIT)  15 mL Mouth Rinse BID  . Chlorhexidine Gluconate Cloth  6 each Topical Q0600  . clonazepam  2 mg Per Tube BID  . collagenase   Topical Daily  . darbepoetin (ARANESP) injection - DIALYSIS  150 mcg Intravenous Q Tue-HD  . docusate  100 mg Per Tube BID  . feeding supplement (PROSource TF)  45 mL Per Tube BID  . fiber  1 packet Per Tube BID  . hydrocerin   Topical Daily  . insulin aspart  0-20 Units Subcutaneous Q4H  . insulin aspart  5 Units Subcutaneous Q4H  . insulin detemir  12 Units Subcutaneous BID  . levothyroxine  125 mcg Per Tube Q0600  . mouth rinse  15 mL Mouth Rinse 10 times per day  . oxyCODONE  10 mg Per Tube Q6H  .  pantoprazole sodium  40 mg Per Tube BID  . polyethylene glycol  17 g Per Tube Daily  . QUEtiapine  100 mg Per Tube BID  . sodium chloride flush  10-40 mL Intracatheter Q12H  . sodium chloride flush  3 mL Intravenous Q12H   . sodium chloride Stopped (03/11/20 1509)  . sodium chloride    . sodium chloride    . sodium chloride    . dexmedetomidine (PRECEDEX) IV infusion Stopped (03/26/20 3174)  . dextrose    . feeding supplement (NEPRO CARB STEADY) 1,000 mL (03/25/20 2126)   sodium chloride, sodium chloride, sodium chloride, sodium chloride, acetaminophen (TYLENOL) oral liquid 160 mg/5 mL, albuterol, alteplase, dextrose, guaiFENesin, heparin, heparin, heparin, influenza vac split quadrivalent PF, lidocaine (PF), lidocaine-prilocaine, lip balm, midazolam, ondansetron (ZOFRAN) IV, pentafluoroprop-tetrafluoroeth, sodium chloride flush, sodium chloride flush

## 2020-03-26 NOTE — Progress Notes (Signed)
This patient has Medicaid which does not include benefits for [facility] - CSW to continue following for discharge planning.  Madilyn Fireman, MSW, LCSW-A Transitions of Care  Clinical Social Worker  [facility] Emergency Departments  Medical ICU [phone]

## 2020-03-26 NOTE — Progress Notes (Addendum)
03/26/2020  I have seen and evaluated the patient for post COVID respiratory failure.  S:  No events, more awake this AM, on cooling blanket to prevent recurrent fevers. She denies pain, smiling.  O: Blood pressure 120/62, pulse (!) 113, temperature 98.6 F (37 C), temperature source Core, resp. rate 19, height 5\' 2"  (1.575 m), weight 108.8 kg, SpO2 97 %.  Chronically ill woman in NAD Trach in place with mild-moderate thick secretions Profoundly weak Heart sounds regular, ext warm Breath sounds diminished at bases  BUN/Cr up WBC slightly up Hgb remains stable Thrombocytosis improved Glucose at goal Net +942cc yesterday Weight 238lbs from 232 lbs  A:  Acute hypoxemic respiratory failure due to COVID19. ESRD on HD Fever, leukocytosis Acute encephalopathy- improving Stage 2 pressure ulcer not POA  P:  - Work on vent weaning today - Check Pct, tracheal aspirate, trend fever curve and WBC count, hold on abx - Wean off precedex, continue klonipin and seroquel   Patient critically ill due to respiratory failure, metabolic encephalopathy Interventions to address this today include weaning sedation, weaning ventilator,  Risk of deterioration without these interventions is high  I personally spent 35 minutes providing critical care not including any separately billable procedures  [name] MD [facility] 03/26/2020 12:57 PM Personal pager: [phone] If unanswered, please page CCM On-call: [phone]      NAME:  SISSY GOETZKE, MRN:  [id number], DOB:  July 10, 1970, LOS: 35 ADMISSION DATE:  02/13/2020, CONSULTATION DATE:  9/24 REFERRING MD:  Heber Neeses, CHIEF COMPLAINT:  Dyspnea   Brief History   49 yo female admitted with COVID 19 pneumonia on 9/13, had been diagnosed with COVID on 9/9.  On 9/24 her condition worsened and PCCM was consulted, she was sent to the ICU and treated with BIPAP, then eventually intubated.  Started on CRRT through a tunneled  HD cath. Extubated on 10/6 but re-intubated on 10/8.  Has been challenged by sedation needs since admission.   Past Medical History  Sleep apnea Morbid obesity Hypothyroidism Hypertension GERD  End-stage renal disease on HD MWF Diabetes Asthma  Significant Hospital Events   Admitted 9/14 9/24 - Lying in bed on side currently undergoing iHD,  She is lethargic but will arouse to verbal stimuli but quickly falls back to sleep.  RN states patient has had progressive decline over the last 12hrs 9/25 - -currently in medical ICU to [facility].  Bed 12.  She has been on BiPAP with Precedex overnight.  This morning on the low-dose of Precedex she got agitated.?  Also had melena.  She also desaturated.  Nursing then increase her Precedex and since then she has been more stable.  Yesterday dialysis was cut short.  9/26 -unable to run CRRT with prone ventilation.  Resume supine ventilation but still unable to draw blood back. 9/27 transferred to 53m. IR replaced HD catheter 10/1 on CRRT and vasopressors 10/6 - extubated 10/7 - ad some stridor and increased WOB so placed on BIPAP and dexamethasone given.  10/15 weaning sedation. midaz off 10/16 move to 46M 10/18 s/p 6 proximal XLT Shiley tracheostomy  10/19: Resumed Eliquis low dose. Per Dr. Thompson Caul note 10/7 will need to be on [facility] for 1 month then reasses 10/19: Episode of hypoglycemia 55. 10/21 hypoglycemic again. levemir and tubefeed coverage decreased. Low grade temp->sputum culture send had vomiting event on 10/19 w/ concern for aspiration. Fent was stopped 10/20. Still gets anxious at night. Required increased precedex. Added clonazepam. 10/22 attempting PSV trials  Consults:  PCCM Nephrology  Procedures:  9/23 tunneled HD cath placement >  10/8 ETT > 10/18 10/18 6 proximal XLT Shiley tracheostomy  >  Significant Diagnostic Tests:  9/21 Vas ultrasound> thrombosed LUE fistula 9/21 VQ scan > negative for PE 9/23 echo> LVEF 60-65%, flat  ventrcile consistent with RV pressure overload, RV severely enlarged, moderate reduction in RV function  Micro Data:  9/13 blood > neg   9/14 sars cov 2 > pos 9/26 blood > neg 10/8 resp > MSSA 10/8 blood > negative  10/21 sputum > Normal flora   Antimicrobials:  9/14 remdesivir > 9/19 9/15 tocilizumab > x1 9/20 cefepime > 9/29 9/20 azithro >  9/25 10/8 vanc > 10/9 10/8 zosyn > x1 10/10 ceftriaxone > 10/16  Interim history/subjective:  Spiked temp overnight with worse leukocytosis  Able to wean precedex this AM   Objective   Blood pressure (!) 99/53, pulse (!) 104, temperature 98.7 F (37.1 C), temperature source Core, resp. rate (!) 29, height 5\' 2"  (1.575 m), weight 108.8 kg, SpO2 100 %.    Vent Mode: PRVC FiO2 (%):  [40 %-50 %] 40 % Set Rate:  [16 bmp-29 bmp] 29 bmp Vt Set:  [400 mL] 400 mL PEEP:  [10 cmH20] 10 cmH20 Pressure Support:  [10 cmH20] 10 cmH20 Plateau Pressure:  [17 cmH20-22 cmH20] 20 cmH20   Intake/Output Summary (Last 24 hours) at 03/26/2020 0840 Last data filed at 03/26/2020 0800 Gross per 24 hour  Intake 1342.25 ml  Output 400 ml  Net 942.25 ml   Filed Weights   03/24/20 1057 03/25/20 0500 03/26/20 0500  Weight: 103.6 kg 105.5 kg 108.8 kg    Examination: General: Chronically ill appearing elderly female lying in bed on mechanical ventilation through trach in NAD HEENT: 6 cuffed proximal XLT trach, MM pink/moist, PERRL,  Neuro: Will open eyes to verbal stimuli, unable to follow commands  CV: s1s2 regular rate and rhythm, no murmur, rubs, or gallops,  PULM:  Bilateral rhonchi, tolerating vent well, thick tracheal secretions  GI: soft, bowel sounds active in all 4 quadrants, non-tender, non-distended, tolerating TF Extremities: warm/dry, no edema  Skin: no rashes or lesions  Resolved Hospital Problem list   Thrombocytopenia  MSSA PNA  Assessment & Plan:   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  P: Routine trach care  Continue daily attempts to wean trial  Continue ventilator support with lung protective strategies  Wean PEEP and FiO2 for sats greater than 90%. Head of bed elevated 30 degrees. Plateau pressures less than 30 cm H20.  Follow intermittent chest x-ray and ABG.   Ensure adequate pulmonary hygiene  VAP bundle in place  PAD protocol SLP consulted for inline PSV trial   Fever with leukocytosis  -Patient seen with spike in fever with T-max 103.2 and worsening leukocytosis of 22.7 10/24 P: Repeat blood cultures sent overnight 10/25 CXR unchanged  Low threshold to resume antibiotics  Will check procalitonin now  Obtain trach culture as well   ESRD -S/P CRRT 9/26 > 10/7 P: Follow renal function / urine output Trend Bmet Avoid nephrotoxins, ensure adequate renal perfusion  Continue iHD T/TH/S  Hypokalemia  P: Supplement as needed   Nausea P: Continue PRN antiemetic   Acute metabolic encephalopathy w/ on-going need for sedation on mechanical ventilation -This is an ongoing challenging issue. P: RASS goal 0 Continue home Oxy Precedex drip weaned off morning of 10/25 Continue Klonopin  2mg  BID and Seroquel 100mg  BID   Anemia of critical and chronic illness -No evidence of current bleeding.  P: Trend CBC Transfuse for hgb >7  Hypothyroidism P: Continue synthroid   DM2 with both hypo and hyperglycemia -Labile Blood sugars P: Continue SSI CBG checks q4 CBG goal 140-180 Continue long acting insulin   Stage 2 sacral pressure ulcer P: Frequent would care Pressure elevating devices Frequent turn Ensure adequate nutrition   Best practice:  Diet: tube feeding Pain/Anxiety/Delirium protocol (if indicated): as above VAP protocol (if indicated): yes DVT prophylaxis: has been on eliquis for high risk of VTE/ resumed anticoagulation   Eliquis X 1 month per Dr. Thompson Caul note 10/7. Will re-assess need to continue GI prophylaxis:  Pantoprazole for stress ulcer prophylaxis Glucose control: SSI Mobility: bed rest Code Status: full Family Communication: updated Tiona over the phone on 10/23 at 5pm. Will update again today.  Disposition: remain in ICU  CRITICAL CARE Performed by: Johnsie Cancel  Total critical care time: 38 minutes  Critical care time was exclusive of separately billable procedures and treating other patients.  Critical care was necessary to treat or prevent imminent or life-threatening deterioration.  Critical care was time spent personally by me on the following activities: development of treatment plan with patient and/or surrogate as well as nursing, discussions with consultants, evaluation of patient's response to treatment, examination of patient, obtaining history from patient or surrogate, ordering and performing treatments and interventions, ordering and review of laboratory studies, ordering and review of radiographic studies, pulse oximetry and re-evaluation of patient's condition.  Johnsie Cancel, NP-C [facility] Contact / Pager information can be found on Amion  03/26/2020, 8:48 AM

## 2020-03-26 NOTE — Consult Note (Signed)
WOC Nurse Consult Note: Reason for Consult: Wound progression from Stage 2 to Unstageable Pressure Injury at coccygeal area. Wound type: Pressure, shear, moisture Pressure Injury POA:No Measurement: 3.4cm x 2cm with depth unable to be determined due to the presence of nonviable tissue (white/grey slough) in the wound bed Wound bed: As described above Drainage (amount, consistency, odor) small amount of yellow exudate on old dressing consistent with autolytic debridement of nonviable tissue Periwound: with scattered areas of medical adhesive related skin injury, the largest measuring 1cm x 2cm x 0.1cm Dressing procedure/placement/frequency: Patient remains in the ICU for an extended period of critical illness for Covid requiring extended mechanical ventilation. She is of bariatric proportions and has difficulty tolerating positions other than the semi fowler's position in the bed which is a contributing factor to her pressure injury. She is incontinent of liquid stool and has a bowel management system (FlexiSeal) in place. I will add an enzymatic debriding agent (collagenase/Santyl) to the POC and have recommended Nursing invert the silicone foam dressing with orient the "tip" upward away from the anus. This provides coverage of the partial thickness areas of skin loss (medical adhesive related skin injury) by the silicone foam.  Recommend turning, even incremental and slight, to offload the coccygeal area. No pressure injuries to bilateral heels.  [place] nursing team will follow, seeing patient every 7-10 days and will remain available to this patient, the nursing and medical teams.  Please reconsult if needed in between visits.  Thanks, Maudie Flakes, MSN, RN, [place], Arther Abbott  Pager# [phone]

## 2020-03-26 NOTE — Progress Notes (Signed)
Video call with family completed

## 2020-03-26 NOTE — Progress Notes (Signed)
Assisted tele visit to patient with daughter. ° °Elanie Hammitt P, RN  °

## 2020-03-27 ENCOUNTER — Inpatient Hospital Stay (HOSPITAL_COMMUNITY): Payer: Medicaid Other

## 2020-03-27 DIAGNOSIS — U071 COVID-19: Secondary | ICD-10-CM | POA: Diagnosis not present

## 2020-03-27 DIAGNOSIS — J159 Unspecified bacterial pneumonia: Secondary | ICD-10-CM | POA: Diagnosis not present

## 2020-03-27 DIAGNOSIS — J80 Acute respiratory distress syndrome: Secondary | ICD-10-CM | POA: Diagnosis not present

## 2020-03-27 LAB — RENAL FUNCTION PANEL
Albumin: 1.6 g/dL — ABNORMAL LOW (ref 3.5–5.0)
Anion gap: 15 (ref 5–15)
BUN: 72 mg/dL — ABNORMAL HIGH (ref 6–20)
CO2: 25 mmol/L (ref 22–32)
Calcium: 10.3 mg/dL (ref 8.9–10.3)
Chloride: 95 mmol/L — ABNORMAL LOW (ref 98–111)
Creatinine, Ser: 7.72 mg/dL — ABNORMAL HIGH (ref 0.44–1.00)
GFR, Estimated: 6 mL/min — ABNORMAL LOW (ref >60)
Glucose, Bld: 168 mg/dL — ABNORMAL HIGH (ref 70–99)
Phosphorus: 6.2 mg/dL — ABNORMAL HIGH (ref 2.5–4.6)
Potassium: 4 mmol/L (ref 3.5–5.1)
Sodium: 135 mmol/L (ref 135–145)

## 2020-03-27 LAB — CBC
HCT: 23 % — ABNORMAL LOW (ref 36.0–46.0)
Hemoglobin: 7.2 g/dL — ABNORMAL LOW (ref 12.0–15.0)
MCH: 29.6 pg (ref 26.0–34.0)
MCHC: 31.3 g/dL (ref 30.0–36.0)
MCV: 94.7 fL (ref 80.0–100.0)
Platelets: 503 10*3/uL — ABNORMAL HIGH (ref 150–400)
RBC: 2.43 MIL/uL — ABNORMAL LOW (ref 3.87–5.11)
RDW: 17.5 % — ABNORMAL HIGH (ref 11.5–15.5)
WBC: 19.4 10*3/uL — ABNORMAL HIGH (ref 4.0–10.5)
nRBC: 0 % (ref 0.0–0.2)

## 2020-03-27 LAB — GLUCOSE, CAPILLARY
Glucose-Capillary: 168 mg/dL — ABNORMAL HIGH (ref 70–99)
Glucose-Capillary: 146 mg/dL — ABNORMAL HIGH (ref 70–99)
Glucose-Capillary: 181 mg/dL — ABNORMAL HIGH (ref 70–99)
Glucose-Capillary: 231 mg/dL — ABNORMAL HIGH (ref 70–99)
Glucose-Capillary: 114 mg/dL — ABNORMAL HIGH (ref 70–99)

## 2020-03-27 MED ORDER — MIDAZOLAM HCL 2 MG/2ML IJ SOLN
2.0000 mg | INTRAMUSCULAR | Status: DC | PRN
  Administered 2020-03-27: 4 mg via INTRAVENOUS
  Administered 2020-03-29 – 2020-04-05 (×5): 2 mg via INTRAVENOUS
  Filled 2020-03-27: qty 4
  Filled 2020-03-27 (×4): qty 2
  Filled 2020-03-27: qty 4
  Filled 2020-03-27: qty 2

## 2020-03-27 MED ORDER — CLONAZEPAM 0.5 MG PO TBDP
1.0000 mg | ORAL_TABLET | Freq: Two times a day (BID) | ORAL | Status: DC
  Administered 2020-03-27 – 2020-03-28 (×4): 1 mg
  Filled 2020-03-27 (×4): qty 2

## 2020-03-27 MED ORDER — DEXMEDETOMIDINE HCL IN NACL 400 MCG/100ML IV SOLN
0.4000 ug/kg/h | INTRAVENOUS | Status: DC
  Administered 2020-03-27 (×2): 0.6 ug/kg/h via INTRAVENOUS
  Administered 2020-03-28 (×2): 0.4 ug/kg/h via INTRAVENOUS
  Administered 2020-03-29 (×3): 1 ug/kg/h via INTRAVENOUS
  Administered 2020-03-30: 0.2 ug/kg/h via INTRAVENOUS
  Filled 2020-03-27 (×9): qty 100

## 2020-03-27 NOTE — Progress Notes (Signed)
Renal Navigator notes documentation by CSW that patient does not qualify for [facility] given that her only insurance is Medicaid, which does not cover.  Patient will not be considered for acceptance back to her outpatient HD clinic until she is either decannulated or her trach is well healed and not requiring any suctioning. If case is the latter, patient's re-acceptance to outpatient HD will need to be approved by [employer].  Renal Navigator will follow.  Alphonzo Cruise, [place] Renal Navigator [phone]

## 2020-03-27 NOTE — Progress Notes (Signed)
Attempted video chat with daughter. Link sent x 3, waited 7 minutes with no response. bedside RN notified

## 2020-03-27 NOTE — Progress Notes (Signed)
Assisted tele visit to patient with daughter. ° °Bradee Common P, RN  °

## 2020-03-27 NOTE — Progress Notes (Signed)
NAME:  Wanda Ramirez, MRN:  [id number], DOB:  01-12-1971, LOS: 44 ADMISSION DATE:  02/13/2020, CONSULTATION DATE:  9/24 REFERRING MD:  Heber Dryden, CHIEF COMPLAINT:  Dyspnea   Brief History   49 yo female admitted with COVID 19 pneumonia on 9/13, had been diagnosed with COVID on 9/9.  On 9/24 her condition worsened and PCCM was consulted, she was sent to the ICU and treated with BIPAP, then eventually intubated.  Started on CRRT through a tunneled HD cath. Extubated on 10/6 but re-intubated on 10/8.  Has been challenged by sedation needs since admission.   Past Medical History  Sleep apnea Morbid obesity Hypothyroidism Hypertension GERD  End-stage renal disease on HD MWF Diabetes Asthma  Significant Hospital Events   Admitted 9/14 9/24 - Lying in bed on side currently undergoing iHD,  She is lethargic but will arouse to verbal stimuli but quickly falls back to sleep.  RN states patient has had progressive decline over the last 12hrs 9/25 - -currently in medical ICU to [facility].  Bed 12.  She has been on BiPAP with Precedex overnight.  This morning on the low-dose of Precedex she got agitated.?  Also had melena.  She also desaturated.  Nursing then increase her Precedex and since then she has been more stable.  Yesterday dialysis was cut short.  9/26 -unable to run CRRT with prone ventilation.  Resume supine ventilation but still unable to draw blood back. 9/27 transferred to 34m. IR replaced HD catheter 10/1 on CRRT and vasopressors 10/6 - extubated 10/7 - ad some stridor and increased WOB so placed on BIPAP and dexamethasone given.  10/15 weaning sedation. midaz off 10/16 move to 71M 10/18 s/p 6 proximal XLT Shiley tracheostomy  10/19: Resumed Eliquis low dose. Per Dr. Thompson Caul note 10/7 will need to be on [facility] for 1 month then reasses 10/19: Episode of hypoglycemia 55. 10/21 hypoglycemic again. levemir and tubefeed coverage decreased. Low grade temp->sputum culture send had vomiting event  on 10/19 w/ concern for aspiration. Fent was stopped 10/20. Still gets anxious at night. Required increased precedex. Added clonazepam. 10/22 attempting PSV trials  10/25 precedex drip weaned off   Consults:  PCCM Nephrology  Procedures:  9/23 tunneled HD cath placement >  10/8 ETT > 10/18 10/18 6 proximal XLT Shiley tracheostomy  >  Significant Diagnostic Tests:  9/21 Vas ultrasound> thrombosed LUE fistula 9/21 VQ scan > negative for PE 9/23 echo> LVEF 60-65%, flat ventrcile consistent with RV pressure overload, RV severely enlarged, moderate reduction in RV function  Micro Data:  9/13 blood > neg   9/14 sars cov 2 > pos 9/26 blood > neg 10/8 resp > MSSA 10/8 blood > negative  10/21 sputum > Normal flora   Antimicrobials:  9/14 remdesivir > 9/19 9/15 tocilizumab > x1 9/20 cefepime > 9/29 9/20 azithro >  9/25 10/8 vanc > 10/9 10/8 zosyn > x1 10/10 ceftriaxone > 10/16  Interim history/subjective:  Lying in bed, more alert and able to follow simple commands this morning  Undergoing bedside iHD this AM Copious trachel secretions  T-max 100.4 overnight   Objective   Blood pressure 116/69, pulse (!) 116, temperature 98.7 F (37.1 C), temperature source Axillary, resp. rate 19, height 5\' 2"  (1.575 m), weight 111.1 kg, SpO2 100 %.    Vent Mode: PRVC FiO2 (%):  [35 %-40 %] 40 % Set Rate:  [18 bmp] 18 bmp Vt Set:  [400 mL] 400 mL PEEP:  [10 cmH20] 10 cmH20 Pressure  Support:  [10 [place] Pressure:  [20 cmH20] 20 cmH20   Intake/Output Summary (Last 24 hours) at 03/27/2020 0733 Last data filed at 03/27/2020 0600 Gross per 24 hour  Intake 1252.33 ml  Output 700 ml  Net 552.33 ml   Filed Weights   03/25/20 0500 03/26/20 0500 03/27/20 0630  Weight: 105.5 kg 108.8 kg 111.1 kg    Examination: General: Deconditioned adult female lying in bed in NAD HEENT: 6cuffed proximal XLT trach midline, MM pink/moist, PERRL,  Neuro: Will open eyes to commands and  is able to follow simple commands this morning CV: s1s2 regular rate and rhythm, no murmur, rubs, or gallops,  PULM:  Bilateral rhonchi with copious tracheal secretions reported,   GI: soft, bowel sounds active in all 4 quadrants, non-tender, non-distended, tolerating TF Extremities: warm/dry, no edema  Skin: no rashes or lesions  Resolved Hospital Problem list   Thrombocytopenia  MSSA PNA  Nausea   Assessment & Plan:   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  P: Routine trach care  Was able to tolerated 7hrs of ATC 10/26 Daily attempts of vent wean  Continue ventilator support with lung protective strategies  Wean PEEP and FiO2 for sats greater than 90%. Head of bed elevated 30 degrees. Plateau pressures less than 30 cm H20.  Ensure adequate pulmonary hygiene  Follow cultures  VAP bundle in place  PAD protocol  Fever with leukocytosis concern for VAP -Patient seen with spike in fever with T-max 103.2 and worsening leukocytosis of 22.7 10/24 P: Fever curve and WBC downtrending  Procalcitonin 15.73 She has has sever round of antibiotics this admission I think it is reasonable to continue to watch off antibiotics for now Follow repeat blood and sputum cultures  Low threshold to resume antibiotics   ESRD -S/P CRRT 9/26 > 10/7 P: Nephrology following  Next iHD scheduled 10/26 Follow renal function  Trend Bmet  Will need new perm access prior to discharge   Hypokalemia  P: Supplement as needed   Acute metabolic encephalopathy w/ on-going need for sedation on mechanical ventilation -This is an ongoing challenging issue. P: Precedex drip stopped 10/25 RASS goal 0 Continue home oxy Continue Klonopin and seroquel at current dose, wean as able   Anemia of critical and chronic illness -No evidence of current bleeding.  P: Trend CBC Transfuse for hgb >7  Hypothyroidism P: Continue Synthroid   DM2  with both hypo and hyperglycemia -Labile Blood sugars P: Continue  SSI CBG checks q4hrs CBG goal 140-180 Continue long acting insulin   Stage 2 sacral pressure ulcer P: Wound care Frequent turn Ensure adequate renal perfusion WOC folowing  Best practice:  Diet: tube feeding Pain/Anxiety/Delirium protocol (if indicated): as above VAP protocol (if indicated): yes DVT prophylaxis: has been on eliquis for high risk of VTE/ resumed anticoagulation   Eliquis X 1 month per Dr. Thompson Caul note 10/7. Will re-assess need to continue GI prophylaxis: Pantoprazole for stress ulcer prophylaxis Glucose control: SSI Mobility: bed rest Code Status: full Family Communication: Will update family today  Disposition: remain in ICU  CRITICAL CARE Performed by: Johnsie Cancel  Total critical care time: 37 minutes  Critical care time was exclusive of separately billable procedures and treating other patients.  Critical care was necessary to treat or prevent imminent or life-threatening deterioration.  Critical care was time spent personally by me on the following activities: development of treatment plan with  patient and/or surrogate as well as nursing, discussions with consultants, evaluation of patient's response to treatment, examination of patient, obtaining history from patient or surrogate, ordering and performing treatments and interventions, ordering and review of laboratory studies, ordering and review of radiographic studies, pulse oximetry and re-evaluation of patient's condition.  Johnsie Cancel, NP-C [facility] Contact / Pager information can be found on Amion  03/27/2020, 7:33 AM

## 2020-03-27 NOTE — Progress Notes (Signed)
Nutrition Follow-up  DOCUMENTATION CODES:   Morbid obesity  INTERVENTION:   Tube feeding via Cortrak: -Nepro @ 55 ml/h (1320 ml per day) -Prosource TF 45 ml BID  Provides 2456 kcal, 128 gm protein, 959 ml free water daily.  Nutrisource Fiber BID Continue B-complex with vitamin C via tube  NUTRITION DIAGNOSIS:   Inadequate oral intake related to inability to eat as evidenced by NPO status.  Ongoing   GOAL:   Patient will meet greater than or equal to 90% of their needs  Addressed via TF  MONITOR:   PO intake, Supplement acceptance, Labs  REASON FOR ASSESSMENT:   Consult Assessment of nutrition requirement/status  ASSESSMENT:   49 year old female with a past medical history significant for end-stage renal disease on hemodialysis MWF, chronic hypoxic respiratory failure on 3 L nasal cannula at baseline, sleep apnea, diabetes, hypertension, hyperlipidemia, hypothyroidism, and asthma presents with complaints of worsening shortness of breath. Previously diagnosed with COVID PNA on 9/9.   9/28- start CRRT 10/4 cortrak placed; tip gastric 10/7- end CRRT 10/8 pt reintubated 10/14 s/p iHD 10/18- trach   Febrile. Off precedex. Tolerated trach collar for 6 hours yesterday.Continues with copious secretions. Underwent HD this am.  Tolerating tube feeding at goal rate.   Patient requiring ventilator support via trach MV: 13.4 L/min Temp (24hrs), Avg:99.4 F (37.4 C), Min:98.4 F (36.9 C), Max:101.3 F (38.5 C)  EDW: 111.5 kg  Current weight: 108.1 kg   Medications: aranesp, SS novolog, levemir Labs: CBG 102-231  Diet Order:   Diet Order            Diet NPO time specified  Diet effective midnight                 EDUCATION NEEDS:   Not appropriate for education at this time  Skin:  Skin Assessment: Skin Integrity Issues: Skin Integrity Issues:: Stage II Stage II: R buttocks  Last BM:  10/26  Height:   Ht Readings from Last 1 Encounters:  03/24/20  5\' 2"  (1.575 m)    Weight:   Wt Readings from Last 1 Encounters:  03/27/20 108.1 kg    Ideal Body Weight:  50 kg  BMI:  Body mass index is 43.59 kg/m.  Estimated Nutritional Needs:   Kcal:  2065-2260  Protein:  110-125 gm  Fluid:  1 L + UOP  Graeson Nouri RD, LDN Clinical Nutrition Pager listed in [place]

## 2020-03-27 NOTE — Progress Notes (Signed)
[facility] Progress Note  Subjective: pt seen in ICU.   Vitals:   03/27/20 0900 03/27/20 0915 03/27/20 0930 03/27/20 0945  BP: 108/71 125/66 112/77 110/65  Pulse: (!) 121 (!) 123 (!) 118 (!) 122  Resp: 19 (!) 23 (!) 21 (!) 24  Temp:      TempSrc:      SpO2:  100% 100% 100%  Weight:      Height:        Exam: More alert, NG in , +trach  CVS- RRR without murmurs rubs or gallops RS- CTA ventilator dependent ABD- BS present soft non-distended   EXT- no edema LE's    OP HD: TTS 4h 65mn 450/800 2/2.25 bath 111.5kg Hep 2000 L AVF - darbe 50 ug q week, last 9/7 - calc 1.0 tiw - 9/9 Hb 10.0, tsat 22%   Summary: 49yo female w/ ESRD on TTS HD admitted for COVID PNA on 02/13/20. She was intubated. She had CRRT around 9/26- 10/7.  Now is on intermittent HD. She is sp trach. She had MSSA pna. DM2.   Assessment/ Plan: 1. COVID pna /sp trach - on vent, attempting to wean 2. MSSA HCAP/ VAP - finished course of abx.  3. ESRD - sp CRRT 9/26- 10/7. Now on iHD. HD today 4. BP/ volume - no pressors or BP lowering meds. Euvolemic on exam. 2-3 L UF w/ HD.  5. HD access: her LUA AVF clotted while here, will need new perm access when more stable. TDC x2 here by IR.  6. 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 DM2 - per pmd     Rob Elva Mauro 03/27/2020, 9:59 AM   Recent Labs  Lab 03/24/20 0551 03/25/20 0650 03/25/20 0650 03/25/20 2343 03/26/20 0226 03/27/20 0623  K  --  3.8   < >  --  4.4 4.0  BUN  --  37*   < >  --  52* 72*  CREATININE  --  4.30*   < >  --  5.92* 7.72*  CALCIUM  --  9.5   < >  --  9.8 10.3  PHOS  --  4.6  --   --   --  6.2*  HGB   < >  --   --  7.5*  --  7.2*   < > = values in this interval not displayed.   Inpatient medications:  apixaban  2.5 mg Per Tube BID   B-complex with vitamin C  1 tablet Per Tube Daily   chlorhexidine gluconate (MEDLINE  KIT)  15 mL Mouth Rinse BID   Chlorhexidine Gluconate Cloth  6 each Topical Q0600   clonazepam  1 mg Per Tube BID   collagenase   Topical Daily   darbepoetin (ARANESP) injection - DIALYSIS  150 mcg Intravenous Q Tue-HD   docusate  100 mg Per Tube BID   feeding supplement (PROSource TF)  45 mL Per Tube BID   fiber  1 packet Per Tube BID   hydrocerin   Topical Daily   insulin aspart  0-20 Units Subcutaneous Q4H   insulin aspart  5 Units Subcutaneous Q4H   insulin detemir  12 Units Subcutaneous BID   levothyroxine  125 mcg Per Tube Q0600   mouth rinse  15 mL Mouth Rinse 10 times per day   oxyCODONE  10 mg Per Tube Q6H  pantoprazole sodium  40 mg Per Tube BID   polyethylene glycol  17 g Per Tube Daily   QUEtiapine  100 mg Per Tube BID   sodium chloride flush  10-40 mL Intracatheter Q12H   sodium chloride flush  3 mL Intravenous Q12H    sodium chloride Stopped (03/11/20 1509)   sodium chloride     sodium chloride     sodium chloride     dextrose     feeding supplement (NEPRO CARB STEADY) 1,000 mL (03/26/20 2000)   sodium chloride, sodium chloride, sodium chloride, sodium chloride, acetaminophen (TYLENOL) oral liquid 160 mg/5 mL, albuterol, alteplase, dextrose, heparin, heparin, heparin, influenza vac split quadrivalent PF, lidocaine (PF), lidocaine-prilocaine, lip balm, midazolam, ondansetron (ZOFRAN) IV, pentafluoroprop-tetrafluoroeth, sodium chloride flush, sodium chloride flush

## 2020-03-28 ENCOUNTER — Inpatient Hospital Stay (HOSPITAL_COMMUNITY): Payer: Medicaid Other

## 2020-03-28 DIAGNOSIS — U071 COVID-19: Secondary | ICD-10-CM | POA: Diagnosis not present

## 2020-03-28 DIAGNOSIS — Z930 Tracheostomy status: Secondary | ICD-10-CM

## 2020-03-28 DIAGNOSIS — J80 Acute respiratory distress syndrome: Secondary | ICD-10-CM | POA: Diagnosis not present

## 2020-03-28 LAB — GLUCOSE, CAPILLARY
Glucose-Capillary: 105 mg/dL — ABNORMAL HIGH (ref 70–99)
Glucose-Capillary: 117 mg/dL — ABNORMAL HIGH (ref 70–99)
Glucose-Capillary: 138 mg/dL — ABNORMAL HIGH (ref 70–99)
Glucose-Capillary: 139 mg/dL — ABNORMAL HIGH (ref 70–99)
Glucose-Capillary: 149 mg/dL — ABNORMAL HIGH (ref 70–99)
Glucose-Capillary: 152 mg/dL — ABNORMAL HIGH (ref 70–99)
Glucose-Capillary: 216 mg/dL — ABNORMAL HIGH (ref 70–99)

## 2020-03-28 LAB — CBC
HCT: 24.1 % — ABNORMAL LOW (ref 36.0–46.0)
Hemoglobin: 7.5 g/dL — ABNORMAL LOW (ref 12.0–15.0)
MCH: 29.5 pg (ref 26.0–34.0)
MCHC: 31.1 g/dL (ref 30.0–36.0)
MCV: 94.9 fL (ref 80.0–100.0)
Platelets: 496 10*3/uL — ABNORMAL HIGH (ref 150–400)
RBC: 2.54 MIL/uL — ABNORMAL LOW (ref 3.87–5.11)
RDW: 17.6 % — ABNORMAL HIGH (ref 11.5–15.5)
WBC: 17.4 10*3/uL — ABNORMAL HIGH (ref 4.0–10.5)
nRBC: 0.1 % (ref 0.0–0.2)

## 2020-03-28 LAB — BASIC METABOLIC PANEL
Anion gap: 15 (ref 5–15)
BUN: 43 mg/dL — ABNORMAL HIGH (ref 6–20)
CO2: 23 mmol/L (ref 22–32)
Calcium: 10 mg/dL (ref 8.9–10.3)
Chloride: 96 mmol/L — ABNORMAL LOW (ref 98–111)
Creatinine, Ser: 4.92 mg/dL — ABNORMAL HIGH (ref 0.44–1.00)
GFR, Estimated: 10 mL/min — ABNORMAL LOW (ref >60)
Glucose, Bld: 223 mg/dL — ABNORMAL HIGH (ref 70–99)
Potassium: 3.4 mmol/L — ABNORMAL LOW (ref 3.5–5.1)
Sodium: 134 mmol/L — ABNORMAL LOW (ref 135–145)

## 2020-03-28 LAB — CULTURE, RESPIRATORY W GRAM STAIN

## 2020-03-28 NOTE — Progress Notes (Signed)
Occupational Therapy Treatment Patient Details Name: Wanda Ramirez MRN: [id number] DOB: 1970-08-09 Today's Date: 03/28/2020    History of present illness 49 yo admitted 9/13 with Covid PNA, intubated 9/24-10/6, reintubated 10/8, CRRT 9/26-10/7, trach and bronch 10/18. PMhx: ESRD TTS, obesity, HTN, GERD, hypothyroidism, DM, asthma   OT comments  Patient supine in bed and agreeable to OT/PT session.  Patient waving "hi" with R UE upon entry, following simple 1 step commands initially but fades with fatigue. HOH support required to wash face supine, proximal support as well due to shoulder weakness. Requires +2 total assist for bed mobility, sitting balance fluctuating between mod assist to min guard (R lateral lean preference, posterior lean with fatigue). Continues to require constant redirection, multimodal cueing due to attention deficits.  VSS on vent, see below for details. Will follow acutely, dc plan remains appropriate.    Follow Up Recommendations  LTACH;CIR (pending medical status )    Equipment Recommendations  Other (comment) (TBD)    Recommendations for Other Services      Precautions / Restrictions Precautions Precautions: Fall Precaution Comments: trach, vent, cortrak, flexiseal Restrictions Weight Bearing Restrictions: No       Mobility Bed Mobility Overal bed mobility: Needs Assistance Bed Mobility: Supine to Sit;Sit to Supine     Supine to sit: Total assist;+2 for physical assistance;+2 for safety/equipment;HOB elevated Sit to supine: Total assist;+2 for physical assistance;+2 for safety/equipment   General bed mobility comments: total assist +2 using bed pad to reposition and transition to EOB, pt with poor initation and sequencing of tasks   Transfers                      Balance Overall balance assessment: Needs assistance Sitting-balance support: No upper extremity supported;Feet supported Sitting balance-Leahy Scale: Poor Sitting balance -  Comments: R lateral lean, initally mod assist fading to min guard once stabilized but with fatigue fades to min guard  Postural control: Posterior lean;Right lateral lean                                 ADL either performed or assessed with clinical judgement   ADL Overall ADL's : Needs assistance/impaired     Grooming: Maximal assistance;Wash/dry face;Bed level Grooming Details (indicate cue type and reason): max assist to maintain shoulder flexion of R UE while washing face  Upper Body Bathing: Maximal assistance;Sitting Upper Body Bathing Details (indicate cue type and reason): +2 to safely maintain EOB sitting, requires hand over hand for task attention to sustain simulated washing arms          Lower Body Dressing: Total assistance;+2 for physical assistance;+2 for safety/equipment Lower Body Dressing Details (indicate cue type and reason): total assist to don socks    Toilet Transfer Details (indicate cue type and reason): deferred            General ADL Comments: EOB sitting with R lateral lean      Vision       Perception     Praxis      Cognition Arousal/Alertness: Awake/alert Behavior During Therapy: Flat affect Overall Cognitive Status: Difficult to assess Area of Impairment: Attention;Following commands;Problem solving;Safety/judgement                   Current Attention Level: Focused   Following Commands: Follows one step commands inconsistently;Follows one step commands with increased time Safety/Judgement: Decreased awareness of deficits;Decreased  awareness of safety   Problem Solving: Slow processing;Decreased initiation;Difficulty sequencing;Requires verbal cues General Comments: patient following simple commands with increased time, requires redirection and mulitmodal cueing to attend with sustained tasks or fatigue         Exercises Exercises: General Upper Extremity General Exercises - Upper Extremity Shoulder Flexion:  AAROM;Both;5 reps;Supine   Shoulder Instructions       General Comments VSS on vent via trach--PRVC PEEP 5, 40% FiO2 100% SpO2 HR up to 104    Pertinent Vitals/ Pain       Pain Assessment: No/denies pain  Home Living                                          Prior Functioning/Environment              Frequency  Min 3X/week        Progress Toward Goals  OT Goals(current goals can now be found in the care plan section)  Progress towards OT goals: Progressing toward goals  Acute Rehab OT Goals Patient Stated Goal: none stated  Plan Discharge plan remains appropriate;Frequency remains appropriate    Co-evaluation    PT/OT/SLP Co-Evaluation/Treatment: Yes Reason for Co-Treatment: Complexity of the patient's impairments (multi-system involvement);For patient/therapist safety;To address functional/ADL transfers   OT goals addressed during session: ADL's and self-care      AM-PAC OT "6 Clicks" Daily Activity     Outcome Measure   Help from another person eating meals?: Total Help from another person taking care of personal grooming?: A Lot Help from another person toileting, which includes using toliet, bedpan, or urinal?: Total Help from another person bathing (including washing, rinsing, drying)?: A Lot Help from another person to put on and taking off regular upper body clothing?: A Lot Help from another person to put on and taking off regular lower body clothing?: Total 6 Click Score: 9    End of Session Equipment Utilized During Treatment: Other (comment) (vent via trach )  OT Visit Diagnosis: Unsteadiness on feet (R26.81);Other abnormalities of gait and mobility (R26.89);Muscle weakness (generalized) (M62.81);Feeding difficulties (R63.3);Other symptoms and signs involving cognitive function   Activity Tolerance Patient tolerated treatment well   Patient Left in bed;with call bell/phone within reach;with bed alarm set;Other (comment)  (chair position, BUE elevated )   Nurse Communication Mobility status        Time: 1025-8527 OT Time Calculation (min): 25 min  Charges: OT General Charges $OT Visit: 1 Visit OT Treatments $Self Care/Home Management : 8-22 mins  Jolaine Artist, OT Acute Rehabilitation Services Pager [phone] Office [phone]    Delight Stare 03/28/2020, 9:54 AM

## 2020-03-28 NOTE — Progress Notes (Signed)
Physical Therapy Treatment Patient Details Name: Wanda Ramirez MRN: [id number] DOB: 02/01/1971 Today's Date: 03/28/2020    History of Present Illness 49 yo admitted 9/13 with Covid PNA, intubated 9/24-10/6, reintubated 10/8, CRRT 9/26-10/7, trach and bronch 10/18. PMhx: ESRD TTS, obesity, HTN, GERD, hypothyroidism, DM, asthma    PT Comments    Good participation in therapy session today. Pt required +2 total assist supine <> sit. Pt sat EOB x 5-7 minutes. Initially required mod assist to stabilize balance, progressed to min guard, then back to mod assist as she fatigued. Reaching activities outside BOS in sitting. Pt presents with R lateral lean sitting EOB and supine in bed with HOB elevated. Pillows used after return to bed to assist with maintaining midline.    Follow Up Recommendations  Supervision/Assistance - 24 hour;CIR     Equipment Recommendations  Other (comment) (TBD)    Recommendations for Other Services       Precautions / Restrictions Precautions Precautions: Fall;Other (comment) Precaution Comments: trach, vent, cortrak, flexiseal Restrictions Weight Bearing Restrictions: No    Mobility  Bed Mobility Overal bed mobility: Needs Assistance Bed Mobility: Supine to Sit;Sit to Supine     Supine to sit: Total assist;+2 for physical assistance;+2 for safety/equipment;HOB elevated Sit to supine: Total assist;+2 for physical assistance;+2 for safety/equipment   General bed mobility comments: helicopter method using bed pad to transition to/from EOB  Transfers                    Ambulation/Gait                 Stairs             Wheelchair Mobility    Modified Rankin (Stroke Patients Only)       Balance Overall balance assessment: Needs assistance Sitting-balance support: No upper extremity supported;Feet supported Sitting balance-Leahy Scale: Poor Sitting balance - Comments: R lateral lean, initally mod assist fading to min  guard once stabilized but with fatigue fades to mod assist Postural control: Posterior lean;Right lateral lean                                  Cognition Arousal/Alertness: Awake/alert Behavior During Therapy: Flat affect Overall Cognitive Status: Difficult to assess Area of Impairment: Attention;Following commands;Problem solving;Safety/judgement                   Current Attention Level: Focused   Following Commands: Follows one step commands inconsistently;Follows one step commands with increased time Safety/Judgement: Decreased awareness of deficits;Decreased awareness of safety   Problem Solving: Slow processing;Decreased initiation;Difficulty sequencing;Requires verbal cues General Comments: Cues to stay on task.      Exercises General Exercises - Upper Extremity Shoulder Flexion: AAROM;Both;5 reps;Supine General Exercises - Lower Extremity Ankle Circles/Pumps: PROM;AAROM;Both;Supine;10 reps Heel Slides: AAROM;Right;Left;5 reps;Supine    General Comments General comments (skin integrity, edema, etc.): VSS on vent via trach-PRVC PEEP 10, 40% FiO2, 1005 SpO2, max HR 104, BP stable in sitting      Pertinent Vitals/Pain Pain Assessment: Faces Faces Pain Scale: Hurts a little bit Pain Location: generalized Pain Descriptors / Indicators: Grimacing Pain Intervention(s): Monitored during session    Home Living                      Prior Function            PT Goals (current goals can now  be found in the care plan section) Acute Rehab PT Goals Patient Stated Goal: none stated Progress towards PT goals: Progressing toward goals    Frequency    Min 3X/week      PT Plan Discharge plan needs to be updated (Pt's insurance does not cover LTACH.)    Co-evaluation PT/OT/SLP Co-Evaluation/Treatment: Yes Reason for Co-Treatment: Complexity of the patient's impairments (multi-system involvement);For patient/therapist safety PT goals  addressed during session: Mobility/safety with mobility;Balance OT goals addressed during session: ADL's and self-care      AM-PAC PT "6 Clicks" Mobility   Outcome Measure  Help needed turning from your back to your side while in a flat bed without using bedrails?: A Lot Help needed moving from lying on your back to sitting on the side of a flat bed without using bedrails?: Total Help needed moving to and from a bed to a chair (including a wheelchair)?: Total Help needed standing up from a chair using your arms (e.g., wheelchair or bedside chair)?: Total Help needed to walk in hospital room?: Total Help needed climbing 3-5 steps with a railing? : Total 6 Click Score: 7    End of Session   Activity Tolerance: Patient tolerated treatment well Patient left: in bed;with call bell/phone within reach Nurse Communication: Mobility status PT Visit Diagnosis: Other abnormalities of gait and mobility (R26.89);Muscle weakness (generalized) (M62.81);Other symptoms and signs involving the nervous system (O03.704)     Time: 8889-1694 PT Time Calculation (min) (ACUTE ONLY): 25 min  Charges:  $Therapeutic Activity: 8-22 mins                     Lorrin Goodell, PT  Office # [phone] Pager [phone]    Lorriane Shire 03/28/2020, 10:10 AM

## 2020-03-28 NOTE — Progress Notes (Addendum)
03/28/2020  I have seen and evaluated the patient for respiratory failure.  S:  Worsening delirium overnight requiring precedex reinitiation. She appears quite sedated at present, tolerating trach collar since this morning. Ongoing thick secretions from tracheostomy tube  O: Blood pressure 127/66, pulse (!) 105, temperature 99.5 F (37.5 C), temperature source Axillary, resp. rate (!) 22, height 5\' 2"  (1.575 m), weight 107.6 kg, SpO2 100 %.  Ill-appearing woman in no acute distress on trach collar Copious thick secretions from tracheostomy tube, strong cough Lungs with rhonchi bilaterally Trace dependent edema Moves all 4 extremities to command  Heparin level slightly high Blood glucose is acceptable White count 17 from 19, stable No fevers No speciation yet from 10/25 tracheal aspirate  A:  Acute hypoxemic respiratory failure secondary to ARDS from COVID-19 End-stage renal disease on intermittent dialysis Persistent but downtrending leukocytosis, thick tracheal secretions, question hospital-acquired bronchitis.  Monitoring off antibiotics for now. ICU associated delirium and acute metabolic encephalopathy  P:  Fluid removal by dialysis per hemodialysis nurses Continue to push trach collar during the day, getting her persistently off the ventilator may help her disposition Mobility as able Wean off Precedex, continue Klonopin and Seroquel as ordered In ICU pending liberation from ventilator to trach collar   Patient critically ill due to continued ICU delirium vs. Metabolic encephalopathy, hypoxemic respiratory failure Interventions to address this today ventilator weaning, sedation adjustment Risk of deterioration without these interventions is high  I personally spent 34 minutes providing critical care not including any separately billable procedures [name] MD [facility] 03/28/2020 3:01 PM Personal pager: [phone] If unanswered, please  page CCM On-call: [phone]   03/28/2020 [name] MD     NAME:  Wanda Ramirez, MRN:  [id number], DOB:  [birth year], LOS: 83 ADMISSION DATE:  02/13/2020, CONSULTATION DATE:  9/24 REFERRING MD:  Heber Cressey, CHIEF COMPLAINT:  Dyspnea   Brief History   49 yo female admitted with COVID 19 pneumonia on 9/13, had been diagnosed with COVID on 9/9.  On 9/24 her condition worsened and PCCM was consulted, she was sent to the ICU and treated with BIPAP, then eventually intubated.  Started on CRRT through a tunneled HD cath. Extubated on 10/6 but re-intubated on 10/8.  Has been challenged by sedation needs since admission.   Past Medical History  Sleep apnea Morbid obesity Hypothyroidism Hypertension GERD  End-stage renal disease on HD MWF Diabetes Asthma  Significant Hospital Events   Admitted 9/14 9/24 - Lying in bed on side currently undergoing iHD,  She is lethargic but will arouse to verbal stimuli but quickly falls back to sleep.  RN states patient has had progressive decline over the last 12hrs 9/25 - -currently in medical ICU to [facility].  Bed 12.  She has been on BiPAP with Precedex overnight.  This morning on the low-dose of Precedex she got agitated.?  Also had melena.  She also desaturated.  Nursing then increase her Precedex and since then she has been more stable.  Yesterday dialysis was cut short.  9/26 -unable to run CRRT with prone ventilation.  Resume supine ventilation but still unable to draw blood back. 9/27 transferred to 26m. IR replaced HD catheter 10/1 on CRRT and vasopressors 10/6 - extubated 10/7 - ad some stridor and increased WOB so placed on BIPAP and dexamethasone given.  10/15 weaning sedation. midaz off 10/16 move to 49M 10/18 s/p 6 proximal XLT Shiley tracheostomy  10/19: Resumed Eliquis low dose. Per Dr. Thompson Caul note 10/7  will need to be on [facility] for 1 month then reasses 10/19: Episode of hypoglycemia 55. 10/21 hypoglycemic again. levemir and tubefeed  coverage decreased. Low grade temp->sputum culture send had vomiting event on 10/19 w/ concern for aspiration. Fent was stopped 10/20. Still gets anxious at night. Required increased precedex. Added clonazepam. 10/22 attempting PSV trials  10/25 precedex drip weaned off   Consults:  PCCM Nephrology  Procedures:  9/23 tunneled HD cath placement >  10/8 ETT > 10/18 10/18 6 proximal XLT Shiley tracheostomy  >  Significant Diagnostic Tests:  9/21 Vas ultrasound> thrombosed LUE fistula 9/21 VQ scan > negative for PE 9/23 echo> LVEF 60-65%, flat ventrcile consistent with RV pressure overload, RV severely enlarged, moderate reduction in RV function  Micro Data:  9/13 blood > neg   9/14 sars cov 2 > pos 9/26 blood > neg 10/8 resp > MSSA 10/8 blood > negative  10/21 sputum > Normal flora  10/25 Blood > 10/25 Sputum  >  Antimicrobials:  9/14 remdesivir > 9/19 9/15 tocilizumab > x1 9/20 cefepime > 9/29 9/20 azithro >  9/25 10/8 vanc > 10/9 10/8 zosyn > x1 10/10 ceftriaxone > 10/16  Interim history/subjective:  Resting comfortably in bed on full support Weaning not great yesterday  Objective   Blood pressure (!) 100/55, pulse 95, temperature 99.5 F (37.5 C), temperature source Oral, resp. rate (!) 23, height 5\' 2"  (1.575 m), weight 107.6 kg, SpO2 100 %.    Vent Mode: PRVC FiO2 (%):  [40 %] 40 % Set Rate:  [18 bmp] 18 bmp Vt Set:  [400 mL] 400 mL PEEP:  [5 cmH20-10 cmH20] 10 cmH20 Pressure Support:  [5 cmH20] 5 cmH20 Plateau Pressure:  [18 cmH20-20 cmH20] 18 cmH20   Intake/Output Summary (Last 24 hours) at 03/28/2020 0810 Last data filed at 03/28/2020 0600 Gross per 24 hour  Intake 1627.51 ml  Output 3300 ml  Net -1672.49 ml   Filed Weights   03/27/20 0630 03/27/20 1045 03/28/20 0500  Weight: 111.1 kg 108.1 kg 107.6 kg    Examination: General: obese chronically ill appearing female.  HEENT: 6cuffed proximal XLT trach midline, MM pink/moist, PERRL,  Neuro: eyes  open to voice. Interacts appropriately.  CV: RRR, no MRG PULM:  Thin tan tracheal secretions, rhonchi bilaterally.  GI: Soft, non-tender.  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  P: Routine trach care  Continue trach collar efforts.  Follow cultures  VAP bundle in place   Fever with leukocytosis concern for VAP -Patient seen with spike in fever with T-max 103.2 and worsening leukocytosis of 22.7 10/24 P: Follow repeat blood and sputum cultures  Low threshold to resume antibiotics  Tracheal secretions resemble tube feeds. Will get KUB to rule out obstruction, ileus.   ESRD -S/P CRRT 9/26 > 10/7 P: Nephrology following  Trend Bmet  Will need new perm access prior to discharge   Hypokalemia  P: Supplement as needed   Acute metabolic encephalopathy w/ on-going need for sedation on mechanical ventilation -This is an ongoing challenging issue. P: RASS goal 0 Continue home oxy Continue Klonopin and seroquel at current dose, wean as able   Anemia of critical and chronic illness -No evidence of current bleeding.  P: Trend CBC Transfuse for hgb >7  Hypothyroidism P: Continue Synthroid   DM2 with both hypo and hyperglycemia -Labile Blood sugars P: Continue  SSI CBG checks q4hrs CBG goal 140-180 Continue long acting insulin   Stage 2 sacral pressure ulcer P: Wound care Frequent turn Ensure adequate renal perfusion WOC folowing  Best practice:  Diet: tube feeding Pain/Anxiety/Delirium protocol (if indicated): as above VAP protocol (if indicated): yes DVT prophylaxis: has been on eliquis for high risk of VTE/ resumed anticoagulation   Eliquis X 1 month per Dr. Thompson Caul note 10/7. Will re-assess need to continue GI prophylaxis: Pantoprazole for  stress ulcer prophylaxis Glucose control: SSI Mobility: bed rest Code Status: full Family Communication: Family updated. Please call [name] (Daughter) with updates Disposition: remain in ICU  Critical care time: 36 mins   [name], AGACNP-BC [place] for personal pager PCCM on call pager [phone]  03/28/2020 8:24 AM

## 2020-03-28 NOTE — Progress Notes (Signed)
[facility] Progress Note  Subjective: pt seen in ICU.   Vitals:   03/28/20 1000 03/28/20 1154 03/28/20 1200 03/28/20 1300  BP: 121/70  114/71 127/66  Pulse: (!) 106  (!) 101 (!) 105  Resp: (!) 22  (!) 22 (!) 22  Temp:  99.5 F (37.5 C)    TempSrc:  Axillary    SpO2: 100%  100% 100%  Weight:      Height:        Exam: More alert, NG in , +trach  CVS- RRR without murmurs rubs or gallops RS- CTA ventilator dependent ABD- BS present soft non-distended   EXT- no edema LE's    OP HD: TTS 4h 44mn 450/800 2/2.25 bath 111.5kg Hep 2000 L AVF - darbe 50 ug q week, last 9/7 - calc 1.0 tiw - 9/9 Hb 10.0, tsat 22%   Summary: 49yo female w/ ESRD on TTS HD admitted for COVID PNA on 02/13/20. She was intubated. She had CRRT around 9/26- 10/7.  Now is on intermittent HD. She is sp trach. She had MSSA pna. DM2.   Assessment/ Plan: 1. COVID pna /sp trach - trying to wean from vent 2. MSSA HCAP/ VAP - finished course of abx.  3. ESRD - usual HD TTS. sp CRRT 9/26- 10/7. HD tomorrow 4. BP/ volume - euvolemic on exam. 3 L UF w/ HD tomorrow.  5. HD access: her LUA AVF clotted while here, will need new perm access when more stable. TDC x2 here by IR.  6. 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 DM2 - per pmd     Rob Alvin Diffee 03/28/2020, 2:04 PM   Recent Labs  Lab 03/25/20 0650 03/25/20 2343 03/27/20 0623 03/28/20 0922  K 3.8   < > 4.0 3.4*  BUN 37*   < > 72* 43*  CREATININE 4.30*   < > 7.72* 4.92*  CALCIUM 9.5   < > 10.3 10.0  PHOS 4.6  --  6.2*  --   HGB  --    < > 7.2* 7.5*   < > = values in this interval not displayed.   Inpatient medications: . apixaban  2.5 mg Per Tube BID  . B-complex with vitamin C  1 tablet Per Tube Daily  . chlorhexidine gluconate (MEDLINE KIT)  15 mL Mouth Rinse BID  . Chlorhexidine Gluconate Cloth  6 each Topical Q0600  . clonazepam  1 mg  Per Tube BID  . collagenase   Topical Daily  . darbepoetin (ARANESP) injection - DIALYSIS  150 mcg Intravenous Q Tue-HD  . feeding supplement (PROSource TF)  45 mL Per Tube BID  . fiber  1 packet Per Tube BID  . hydrocerin   Topical Daily  . insulin aspart  0-20 Units Subcutaneous Q4H  . insulin aspart  5 Units Subcutaneous Q4H  . insulin detemir  12 Units Subcutaneous BID  . levothyroxine  125 mcg Per Tube Q0600  . mouth rinse  15 mL Mouth Rinse 10 times per day  . oxyCODONE  10 mg Per Tube Q6H  . pantoprazole sodium  40 mg Per Tube BID  . QUEtiapine  100 mg Per Tube BID  . sodium chloride flush  10-40 mL Intracatheter Q12H  . sodium chloride flush  3 mL Intravenous Q12H   . sodium chloride Stopped (03/11/20 1509)  . sodium chloride    .  sodium chloride    . sodium chloride    . dexmedetomidine (PRECEDEX) IV infusion 0.4 mcg/kg/hr (03/28/20 1300)  . dextrose    . feeding supplement (NEPRO CARB STEADY) 1,000 mL (03/27/20 1724)   sodium chloride, sodium chloride, sodium chloride, sodium chloride, acetaminophen (TYLENOL) oral liquid 160 mg/5 mL, albuterol, alteplase, dextrose, heparin, heparin, heparin, influenza vac split quadrivalent PF, lidocaine (PF), lidocaine-prilocaine, lip balm, midazolam, ondansetron (ZOFRAN) IV, pentafluoroprop-tetrafluoroeth, sodium chloride flush, sodium chloride flush

## 2020-03-28 NOTE — Progress Notes (Signed)
  Speech Language Pathology Treatment: [name] Speaking valve  Patient Details Name: Wanda Ramirez MRN: [id number] DOB: [birth year] Today's Date: 03/28/2020 Time: 9470-9628 SLP Time Calculation (min) (ACUTE ONLY): 16 min  Assessment / Plan / Recommendation Clinical Impression  Pt seen for PMV on trach collar. She had copious secretions following slow cuff deflation partially expectorated with constant reflexive coughs- mobilized secretions just shy of her oral cavity. Required assist with RT tracheal suctioning and SLP extracting mucous over tongue base. Work of breathing increased throughout this time, coughing ceased but not able to phonate during vocalization attempts despite cues and efforts. RR 28-33, SpO2 100% and HR 100. To allow greater time and success on trach collar trials, valve removed and cuff remained deflated. Lurline Idol is a size 6 but is XLT. Therapist will continue allowing PMV trials (with ST) and full supervision for increased time and use of upper airway and verbalizations.    HPI HPI: 49 yo admitted 9/13 with Covid PNA, intubated 9/24-10/6, reintubated 10/8, CRRT 9/26-10/7, trach and bronch 10/18. PMhx: ESRD TTS, obesity, HTN, GERD, hypothyroidism, DM, asthma      SLP Plan  Continue with current plan of care       Recommendations         Patient may use Passy-Muir Speech Valve: with SLP only PMSV Supervision: Full         Oral Care Recommendations: Oral care QID Follow up Recommendations: Skilled Nursing facility SLP Visit Diagnosis: Aphonia (R49.1) Plan: Continue with current plan of care       GO                [name] 03/28/2020, 12:01 PM Orbie Pyo Colvin Caroli.Ed Risk analyst [phone] Office [phone]

## 2020-03-28 NOTE — Progress Notes (Signed)
Patient had been off precedex gtt from 15:00-20:30. Patient looks uncomfortable and restless. Precedex gtt still an active order. Bedside RN, re-started precedex with great improvement.  Video call placed with family approx 20 minutes after the initiation. Patient nodding appropriately to family and mouthing words to them. Much more cooperative and comfortable appearing.

## 2020-03-29 ENCOUNTER — Inpatient Hospital Stay (HOSPITAL_COMMUNITY): Payer: Medicaid Other

## 2020-03-29 DIAGNOSIS — J80 Acute respiratory distress syndrome: Secondary | ICD-10-CM | POA: Diagnosis not present

## 2020-03-29 DIAGNOSIS — U071 COVID-19: Secondary | ICD-10-CM | POA: Diagnosis not present

## 2020-03-29 LAB — CBC
HCT: 21.9 % — ABNORMAL LOW (ref 36.0–46.0)
Hemoglobin: 7 g/dL — ABNORMAL LOW (ref 12.0–15.0)
MCH: 29.9 pg (ref 26.0–34.0)
MCHC: 32 g/dL (ref 30.0–36.0)
MCV: 93.6 fL (ref 80.0–100.0)
Platelets: 452 10*3/uL — ABNORMAL HIGH (ref 150–400)
RBC: 2.34 MIL/uL — ABNORMAL LOW (ref 3.87–5.11)
RDW: 17.5 % — ABNORMAL HIGH (ref 11.5–15.5)
WBC: 17.8 10*3/uL — ABNORMAL HIGH (ref 4.0–10.5)
nRBC: 0 % (ref 0.0–0.2)

## 2020-03-29 LAB — BASIC METABOLIC PANEL
Anion gap: 15 (ref 5–15)
BUN: 58 mg/dL — ABNORMAL HIGH (ref 6–20)
CO2: 24 mmol/L (ref 22–32)
Calcium: 10.4 mg/dL — ABNORMAL HIGH (ref 8.9–10.3)
Chloride: 95 mmol/L — ABNORMAL LOW (ref 98–111)
Creatinine, Ser: 6.26 mg/dL — ABNORMAL HIGH (ref 0.44–1.00)
GFR, Estimated: 8 mL/min — ABNORMAL LOW (ref >60)
Glucose, Bld: 94 mg/dL (ref 70–99)
Potassium: 3.5 mmol/L (ref 3.5–5.1)
Sodium: 134 mmol/L — ABNORMAL LOW (ref 135–145)

## 2020-03-29 LAB — PHOSPHORUS: Phosphorus: 5.2 mg/dL — ABNORMAL HIGH (ref 2.5–4.6)

## 2020-03-29 LAB — GLUCOSE, CAPILLARY
Glucose-Capillary: 148 mg/dL — ABNORMAL HIGH (ref 70–99)
Glucose-Capillary: 159 mg/dL — ABNORMAL HIGH (ref 70–99)
Glucose-Capillary: 166 mg/dL — ABNORMAL HIGH (ref 70–99)
Glucose-Capillary: 167 mg/dL — ABNORMAL HIGH (ref 70–99)
Glucose-Capillary: 190 mg/dL — ABNORMAL HIGH (ref 70–99)
Glucose-Capillary: 82 mg/dL (ref 70–99)

## 2020-03-29 LAB — MAGNESIUM: Magnesium: 2.7 mg/dL — ABNORMAL HIGH (ref 1.7–2.4)

## 2020-03-29 MED ORDER — HEPARIN SODIUM (PORCINE) 5000 UNIT/ML IJ SOLN
5000.0000 [IU] | Freq: Three times a day (TID) | INTRAMUSCULAR | Status: DC
  Administered 2020-03-29 – 2020-04-01 (×10): 5000 [IU] via SUBCUTANEOUS
  Filled 2020-03-29 (×11): qty 1

## 2020-03-29 MED ORDER — CLONAZEPAM 0.5 MG PO TBDP
0.5000 mg | ORAL_TABLET | Freq: Three times a day (TID) | ORAL | Status: DC
  Administered 2020-03-29 – 2020-04-21 (×66): 0.5 mg
  Filled 2020-03-29 (×67): qty 1

## 2020-03-29 MED ORDER — ALBUMIN HUMAN 25 % IV SOLN
25.0000 g | Freq: Once | INTRAVENOUS | Status: AC
  Administered 2020-03-29: 25 g via INTRAVENOUS
  Filled 2020-03-29: qty 100

## 2020-03-29 MED ORDER — HYDROXYZINE HCL 10 MG/5ML PO SYRP
10.0000 mg | ORAL_SOLUTION | Freq: Once | ORAL | Status: AC | PRN
  Administered 2020-03-30: 10 mg via ORAL
  Filled 2020-03-29: qty 5

## 2020-03-29 MED ORDER — CLONIDINE HCL 0.3 MG PO TABS
0.3000 mg | ORAL_TABLET | Freq: Four times a day (QID) | ORAL | Status: DC
  Administered 2020-03-29 – 2020-03-31 (×7): 0.3 mg via ORAL
  Filled 2020-03-29 (×8): qty 1

## 2020-03-29 MED ORDER — FENTANYL CITRATE (PF) 100 MCG/2ML IJ SOLN
INTRAMUSCULAR | Status: AC
  Administered 2020-03-29: 100 ug via INTRAVENOUS
  Filled 2020-03-29: qty 2

## 2020-03-29 MED ORDER — FENTANYL CITRATE (PF) 100 MCG/2ML IJ SOLN: 100.0000 ug | Freq: Once | INTRAMUSCULAR | Status: AC

## 2020-03-29 NOTE — Progress Notes (Signed)
SLP Cancellation Note  Patient Details Name: LORITA FORINASH MRN: [id number] DOB: [birth year]   Cancelled treatment:        Pt currently receiving dialysis and will not likely go to trach collar this afternoon (?). Will attempt tomorrow.    [name] 03/29/2020, 10:33 AM  Orbie Pyo Colvin Caroli.Ed Risk analyst [phone] Office [phone]

## 2020-03-29 NOTE — Progress Notes (Signed)
[facility] Progress Note    OP HD: TTS 4h 59mn 450/800 2/2.25 bath 111.5kg Hep 2000 L AVF - darbe 50 ug q week, last 9/7 - calc 1.0 tiw - 9/9 Hb 10.0, tsat 22%  49yo female w/ ESRD on TTS HD admitted for COVID PNA on 02/13/20. She was intubated. She had CRRT around 9/26- 10/7.  Now is on intermittent HD. She is sp trach. She had MSSA pna. DM2.   Assessment/ Plan:   1. COVID pna /sp trach - trying to wean from vent 2. MSSA HCAP/ VAP - finished course of abx.  3. ESRD - usual HD TTS. sp CRRT 9/26- 10/7.   Seen on HD today 4K bath, had to give albumin 25gm Tachycardic even before initiating  4. BP/ volume - euvolemic on exam.   5. HD access: her LUA AVF clotted while here, will need new perm access when more stable. TDC x2 here by IR.  [facility] TC 6. 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 DM2 - per pmd  Subjective:   Tachy even before HD this AM   Objective:   BP 105/72   Pulse (!) 126   Temp 98.3 F (36.8 C) (Axillary)   Resp (!) 34   Ht _0  (1.575 m)   Wt 107.8 kg   SpO2 90%   BMI 43.47 kg/m   Intake/Output Summary (Last 24 hours) at 03/29/2020 1036 Last data filed at 03/29/2020 0500 Gross per 24 hour  Intake 605.06 ml  Output 100 ml  Net 505.06 ml   Weight change: -0.3 kg  Physical Exam: More alert, NG in , +trach CVS- tachy RS- CTA ventilator dependent ABD- BS present soft non-distended  EXT- no edema LE's, lt arm access thrombosed ACCESS: [facility] TC  Imaging: DG Chest Port 1 View  Result Date: 03/29/2020 CLINICAL DATA:  Respiratory failure. EXAM: PORTABLE CHEST 1 VIEW COMPARISON:  03/27/2020 FINDINGS: 0359 hours. Low lung volumes with diffuse bilateral airspace disease, right greater than left. No pneumothorax or substantial pleural effusion. The cardio pericardial silhouette is enlarged. Tracheostomy tube remains in place. A feeding tube passes  into the stomach although the distal tip position is not included on the film. Left IJ central line tip overlies the upper to mid right atrium. IMPRESSION: Low volume film with diffuse bilateral airspace disease, right greater than left. No substantial interval change. Electronically Signed   By: EMisty StanleyM.D.   On: 03/29/2020 06:45   DG CHEST PORT 1 VIEW  Result Date: 03/27/2020 CLINICAL DATA:  Tachypnea EXAM: PORTABLE CHEST 1 VIEW COMPARISON:  03/25/2020, 03/24/2020, 03/19/2020 FINDINGS: Tracheostomy tube similar in position. Left-sided central venous catheter tip over the right atrium. Esophageal tube tip below the diaphragm. Extensive right greater than left pulmonary airspace disease with little change since most recent priors. Stable cardiomediastinal silhouette. No pneumothorax. IMPRESSION: No significant interval change in extensive right greater than left pulmonary airspace disease. Electronically Signed   By: KDonavan FoilM.D.   On: 03/27/2020 20:10   DG Abd Portable 1V  Result Date: 03/28/2020 CLINICAL DATA:  Leukocytosis, ileus, nausea and vomiting EXAM: PORTABLE ABDOMEN - 1 VIEW COMPARISON:  02/27/2020 FINDINGS: Feeding tube folded in the pyloric channel with the tip redirected to the stomach body. Nonobstructive bowel gas pattern. Scattered colonic air noted. No significant ileus or dilatation. Remote cholecystectomy. Degenerative changes of the lower lumbar spine.  IMPRESSION: Feeding tube folded in the distal stomach. Nonobstructive bowel gas pattern Electronically Signed   By: Jerilynn Mages.  Shick M.D.   On: 03/28/2020 09:47    Labs: BMET Recent Labs  Lab 03/23/20 0455 03/24/20 1120 03/25/20 0650 03/26/20 0226 03/27/20 0623 03/28/20 0922 03/29/20 0245  NA 134* 134* 135 135 135 134* 134*  K 3.1* 3.6 3.8 4.4 4.0 3.4* 3.5  CL 96* 97* 97* 97* 95* 96* 95*  CO2 _0 GLUCOSE 233* 167* 153* 184* 168* 223* 94  BUN 49* 19 37* 52* 72* 43* 58*  CREATININE 4.81* 2.43*  4.30* 5.92* 7.72* 4.92* 6.26*  CALCIUM 9.5 8.3* 9.5 9.8 10.3 10.0 10.4*  PHOS  --   --  4.6  --  6.2*  --  5.2*   CBC Recent Labs  Lab 03/23/20 0455 03/24/20 0551 03/25/20 2343 03/27/20 0623 03/28/20 0922 03/29/20 0245  WBC 16.0*   < > 22.7* 19.4* 17.4* 17.8*  NEUTROABS 13.0*  --  19.0*  --   --   --   HGB 7.4*   < > 7.5* 7.2* 7.5* 7.0*  HCT 23.4*   < > 23.9* 23.0* 24.1* 21.9*  MCV 94.4   < > 95.2 94.7 94.9 93.6  PLT 481*   < > 499* 503* 496* 452*   < > = values in this interval not displayed.    Medications:    . apixaban  2.5 mg Per Tube BID  . B-complex with vitamin C  1 tablet Per Tube Daily  . chlorhexidine gluconate (MEDLINE KIT)  15 mL Mouth Rinse BID  . Chlorhexidine Gluconate Cloth  6 each Topical Q0600  . clonazepam  0.5 mg Per Tube TID  . collagenase   Topical Daily  . darbepoetin (ARANESP) injection - DIALYSIS  150 mcg Intravenous Q Tue-HD  . feeding supplement (PROSource TF)  45 mL Per Tube BID  . fiber  1 packet Per Tube BID  . hydrocerin   Topical Daily  . insulin aspart  0-20 Units Subcutaneous Q4H  . insulin aspart  5 Units Subcutaneous Q4H  . insulin detemir  12 Units Subcutaneous BID  . levothyroxine  125 mcg Per Tube Q0600  . mouth rinse  15 mL Mouth Rinse 10 times per day  . oxyCODONE  10 mg Per Tube Q6H  . pantoprazole sodium  40 mg Per Tube BID  . QUEtiapine  100 mg Per Tube BID  . sodium chloride flush  10-40 mL Intracatheter Q12H  . sodium chloride flush  3 mL Intravenous Q12H      Otelia Santee, MD 03/29/2020, 10:36 AM

## 2020-03-29 NOTE — Progress Notes (Signed)
Physical Therapy Treatment Patient Details Name: W[facility] Ramirez MRN: [id number] DOB: 1971/04/03 Today's Date: 03/29/2020    History of Present Illness 49 yo admitted 9/13 with Covid PNA, intubated 9/24-10/6, reintubated 10/8, CRRT 9/26-10/7, trach and bronch 10/18. PMhx: ESRD TTS, obesity, HTN, GERD, hypothyroidism, DM, asthma    PT Comments    Pt finished with HD prior to session with increased lethargy this session. Pt following commands to wiggle toes and grip with hands but not assisting with trunk positioning, mobility to transfers. Pt unable to achieve midline sitting or significantly assist with command following this session. Pt returned to bed with semichair position and pillow under left hip.  Of note in sitting pt noted to have drainage from left ear in nondependent position with RN present and made aware.   HR 98 FiO2 50% PRVC, SpO2 98%  86/58 (67)   Follow Up Recommendations  Supervision/Assistance - 24 hour;CIR;LTACH     Equipment Recommendations  Wheelchair (measurements PT);Hospital bed    Recommendations for Other Services       Precautions / Restrictions Precautions Precautions: Fall;Other (comment) Precaution Comments: trach, vent, cortrak, flexiseal Restrictions Weight Bearing Restrictions: No    Mobility  Bed Mobility Overal bed mobility: Needs Assistance Bed Mobility: Rolling Rolling: Max assist   Supine to sit: Total assist;+2 for physical assistance;+2 for safety/equipment;HOB elevated Sit to supine: Total assist;+2 for physical assistance;+2 for safety/equipment   General bed mobility comments: pt not assisting with trunk repositioning this session. Max assist to roll bil for pad positioning. use of bed features to transition from supine to chair position with max assist to lift trunk from surface with pt maintaining right lean and resistance toward repositioning to left. Unable to further progress balance or transfers this  session  Transfers                 General transfer comment: unable to attempt this session  Ambulation/Gait                 Stairs             Wheelchair Mobility    Modified Rankin (Stroke Patients Only)       Balance Overall balance assessment: Needs assistance Sitting-balance support: No upper extremity supported Sitting balance-Leahy Scale: Zero Sitting balance - Comments: pt with significant right lean and resistance to positioning in midline or left                                    Cognition Arousal/Alertness: Awake/alert Behavior During Therapy: Flat affect Overall Cognitive Status: Impaired/Different from baseline Area of Impairment: Attention;Following commands;Problem solving;Safety/judgement                   Current Attention Level: Focused   Following Commands: Follows one step commands inconsistently Safety/Judgement: Decreased awareness of deficits;Decreased awareness of safety   Problem Solving: Slow processing;Decreased initiation;Difficulty sequencing;Requires verbal cues General Comments: pt with decreased attention <5 sec this session with decreased command following. Pt with spontaneous movement of bil UE end of session but only griping hands and wiggling toes on command during session      Exercises      General Comments        Pertinent Vitals/Pain Pain Assessment: Faces Pain Location: generalized Pain Descriptors / Indicators: Grimacing Pain Intervention(s): Monitored during session;Repositioned    Home Living  Prior Function            PT Goals (current goals can now be found in the care plan section) Progress towards PT goals: Not progressing toward goals - comment (limited by cognition)    Frequency    Min 3X/week      PT Plan Current plan remains appropriate    Co-evaluation     PT goals addressed during session: Mobility/safety with  mobility        AM-PAC PT "6 Clicks" Mobility   Outcome Measure  Help needed turning from your back to your side while in a flat bed without using bedrails?: Total Help needed moving from lying on your back to sitting on the side of a flat bed without using bedrails?: Total Help needed moving to and from a bed to a chair (including a wheelchair)?: Total Help needed standing up from a chair using your arms (e.g., wheelchair or bedside chair)?: Total Help needed to walk in hospital room?: Total Help needed climbing 3-5 steps with a railing? : Total 6 Click Score: 6    End of Session   Activity Tolerance: Patient limited by lethargy Patient left: in bed;with call bell/phone within reach;with bed alarm set Nurse Communication: Mobility status PT Visit Diagnosis: Other abnormalities of gait and mobility (R26.89);Muscle weakness (generalized) (M62.81);Other symptoms and signs involving the nervous system (R29.898)     Time: 9794-8016 PT Time Calculation (min) (ACUTE ONLY): 24 min  Charges:  $Therapeutic Activity: 23-37 mins                     Ravon Mortellaro P, PT Acute Rehabilitation Services Pager: [phone] Office: [place] 03/29/2020, 1:44 PM

## 2020-03-29 NOTE — Progress Notes (Signed)
Assisted tele visit to patient with daughter.  Nataline Basara Ann, RN  

## 2020-03-29 NOTE — Progress Notes (Addendum)
03/29/2020 Attending I have seen and evaluated the patient for respiratory failure.  S:  Still having panic attacks and coughing fits. Getting HD today.  O: Blood pressure (!) 114/92, pulse (!) 138, temperature 98.3 F (36.8 C), temperature source Axillary, resp. rate (!) 30, height 5\' 2"  (1.575 m), weight 107.8 kg, SpO2 98 %.  Ill-appearing woman in no acute distress on trach collar Copious thick secretions continue from tracheostomy tube, strong cough Lungs with rhonchi bilaterally, +accessory muscle use Trace dependent edema Moves all 4 extremities to command  CBG okay White count stable at 17 No fevers No speciation yet from 10/25 tracheal aspirate   A:  Persistent hypoxemic respiratory failure secondary to ARDS from COVID-19 s/p tracheostomy End-stage renal disease on intermittent dialysis Persistent but downtrending leukocytosis, thick tracheal secretions, question hospital-acquired bronchitis.  Monitoring off antibiotics for now to reduce risk of MDR colonization. ICU associated delirium, anxiety, and acute metabolic encephalopathy  P:  Fluid removal by dialysis per nephrology Continue to push trach collar during the day, getting her persistently off the ventilator may help her disposition Mobility as able In ICU pending liberation from ventilator to trach collar Continue to encourage day/night cycles, wean precedex, adjustments made to klonipin and seroquel, clonidine added to help wean off gtt PEG consult   Patient critically ill due to continued ICU delirium vs. Metabolic encephalopathy, hypoxemic respiratory failure Interventions to address this today ventilator weaning, sedation adjustment Risk of deterioration without these interventions is high  I personally spent 32 minutes providing critical care not including any separately billable procedures  [name] MD [facility] 03/29/2020 8:13 AM Personal pager: [phone] If unanswered,  please page CCM On-call: [phone]     NAME:  Wanda Ramirez, MRN:  [id number], DOB:  24-Apr-1971, LOS: 12 ADMISSION DATE:  02/13/2020, CONSULTATION DATE:  9/24 REFERRING MD:  [place], CHIEF COMPLAINT:  Dyspnea   Brief History   49 yo female admitted with COVID 19 pneumonia on 9/13, had been diagnosed with COVID on 9/9.  On 9/24 her condition worsened and PCCM was consulted, she was sent to the ICU and treated with BIPAP, then eventually intubated.  Started on CRRT through a tunneled HD cath. Extubated on 10/6 but re-intubated on 10/8.  Has been challenged by sedation needs since admission.   Past Medical History  Sleep apnea Morbid obesity Hypothyroidism Hypertension GERD  End-stage renal disease on HD MWF Diabetes Asthma  Significant Hospital Events   Admitted 9/14 9/24 - Lying in bed on side currently undergoing iHD,  She is lethargic but will arouse to verbal stimuli but quickly falls back to sleep.  RN states patient has had progressive decline over the last 12hrs 9/25 - -currently in medical ICU to [facility].  Bed 12.  She has been on BiPAP with Precedex overnight.  This morning on the low-dose of Precedex she got agitated.?  Also had melena.  She also desaturated.  Nursing then increase her Precedex and since then she has been more stable.  Yesterday dialysis was cut short.  9/26 -unable to run CRRT with prone ventilation.  Resume supine ventilation but still unable to draw blood back. 9/27 transferred to 49m. IR replaced HD catheter 10/1 on CRRT and vasopressors 10/6 - extubated 10/7 - ad some stridor and increased WOB so placed on BIPAP and dexamethasone given.  10/15 weaning sedation. midaz off 10/16 move to 28M 10/18 s/p 6 proximal XLT Shiley tracheostomy  10/19: Resumed Eliquis low dose. Per Dr. Thompson Caul note  10/7 will need to be on [facility] for 1 month then reasses 10/19: Episode of hypoglycemia 55. 10/21 hypoglycemic again. levemir and tubefeed coverage decreased. Low grade  temp->sputum culture send had vomiting event on 10/19 w/ concern for aspiration. Fent was stopped 10/20. Still gets anxious at night. Required increased precedex. Added clonazepam. 10/22 attempting PSV trials  10/25 precedex drip weaned off   Consults:  PCCM Nephrology  Procedures:  9/23 tunneled HD cath placement >  10/8 ETT > 10/18 10/18 6 proximal XLT Shiley tracheostomy  >  Significant Diagnostic Tests:  9/21 Vas ultrasound> thrombosed LUE fistula 9/21 VQ scan > negative for PE 9/23 echo> LVEF 60-65%, flat ventrcile consistent with RV pressure overload, RV severely enlarged, moderate reduction in RV function  Micro Data:  9/13 blood > neg   9/14 sars cov 2 > pos 9/26 blood > neg 10/8 resp > MSSA 10/8 blood > negative  10/21 sputum > Normal flora  10/25 Blood > ngtd x 3 days 10/25 Sputum  > few diphtheroids   Antimicrobials:  9/14 remdesivir > 9/19 9/15 tocilizumab > x1 9/20 cefepime > 9/29 9/20 azithro >  9/25 10/8 vanc > 10/9 10/8 zosyn > x1 10/10 ceftriaxone > 10/16  Interim history/subjective:  tmax 100.4 Restarted on precedex overnight- off this morning becoming very anxious and labored breathing Tolerated 12 hrs yesterday of trach collar  Objective   Blood pressure (!) 114/92, pulse (!) 138, temperature 98.3 F (36.8 C), temperature source Axillary, resp. rate (!) 30, height 5\' 2"  (1.575 m), weight 107.8 kg, SpO2 98 %.    Vent Mode: PRVC FiO2 (%):  [35 %-40 %] 40 % Set Rate:  [18 bmp] 18 bmp Vt Set:  [400 mL] 400 mL PEEP:  [10 cmH20] 10 cmH20 Plateau Pressure:  [18 cmH20-27 cmH20] 27 cmH20   Intake/Output Summary (Last 24 hours) at 03/29/2020 0813 Last data filed at 03/29/2020 0500 Gross per 24 hour  Intake 736.66 ml  Output 100 ml  Net 636.66 ml   Filed Weights   03/27/20 1045 03/28/20 0500 03/29/20 0337  Weight: 108.1 kg 107.6 kg 107.8 kg   Examination: General:  Chronically ill appearing female in mild to moderate distress after precedex  off HEENT: MM pink/moist, right nare cortrak, midline prox XLT 6 shiley, diaphoretic face, pupils 3/reactive Neuro: will open eyes and follow intermittent commands, lightly squeezes hand, flicker of movement in toes CV: ST, distant heart sounds, left chest HD cath site wnl PULM:  Tachypneic, bilateral rhonchi with mild tan secretions GI: soft, bs active Extremities: warm/dry, no LE edema  Skin: no rashes   +768/ net +4.1 CXR reviewed; no significant change in diffuse infl  Resolved Hospital Problem list   Thrombocytopenia  MSSA PNA  Nausea   Assessment & Plan:   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  P: Full MV support with daily PSV/ ATC trials VAP bundle/ PPI  Ongoing trach care  Fever with leukocytosis concern for VAP -Patient seen with spike in fever with T-max 103.2 and worsening leukocytosis of 22.7 10/24 P: Leukocytosis improving  Follow BC; Trach asp few diphtheroids  Continue to trend WBC/ fever curve Monitor clinically for now off abx  KUB shows nonobstructive bowel gas pattern with feeding tube in stomach   ESRD -S/P CRRT 9/26 > 10/7 - anuric  P: Nephrology following, continues on iHD  Trend BMET Longterm access per Nephrology   Hypokalemia  P: Per nephrology/  iHD today   Acute metabolic encephalopathy w/ on-going need for sedation on mechanical ventilation -This is an ongoing challenging issue. P: RASS goal 0 Continue precedex (very responsive to this); start clonidine taper to wean off precedex Continue home oxy Continue klonopin- change to TID  Continue seroquel, ? If helping  Anemia of critical and chronic illness -No evidence of current bleeding.  P: Trend CBC Transfuse for hgb >7 No signs of bleeding  Hypothyroidism P: Continue Synthroid   DM2 with both hypo and hyperglycemia -Labile Blood sugars P: Continue  SSI resistant  CBG checks q4hrs CBG goal 140-180 TF coverage  5 units q 4hr Continue levemir 12 units BID   Stage 2 sacral pressure ulcer P: Wound care Frequent turn Ensure adequate renal perfusion WOC folowing  Best practice:  Diet: tube feeding Pain/Anxiety/Delirium protocol (if indicated): as above VAP protocol (if indicated): yes DVT prophylaxis: has been on eliquis for high risk of VTE/ resumed anticoagulation   Eliquis X 1 month per Dr. Thompson Caul note 10/7. Will re-assess need to continue GI prophylaxis: Pantoprazole for stress ulcer prophylaxis Glucose control: SSI Mobility: bed rest Code Status: full Family Communication: Please call [name] (Daughter) with updates. Pending 10/28. Disposition: ICU  Critical care time: 30 mins   Kennieth Rad, ACNP [facility] 03/29/2020, 8:14 AM  See Shea Evans for personal pager PCCM on call pager [phone]

## 2020-03-29 NOTE — Progress Notes (Signed)
Pt transported from 2M13 to CT and back w/no complications. Pts respiratory status stable throughout transport w/no distress noted. RT will continue to monitor.

## 2020-03-30 DIAGNOSIS — J9601 Acute respiratory failure with hypoxia: Secondary | ICD-10-CM | POA: Diagnosis not present

## 2020-03-30 DIAGNOSIS — U071 COVID-19: Secondary | ICD-10-CM | POA: Diagnosis not present

## 2020-03-30 LAB — BASIC METABOLIC PANEL
Anion gap: 15 (ref 5–15)
BUN: 44 mg/dL — ABNORMAL HIGH (ref 6–20)
CO2: 25 mmol/L (ref 22–32)
Calcium: 9.7 mg/dL (ref 8.9–10.3)
Chloride: 94 mmol/L — ABNORMAL LOW (ref 98–111)
Creatinine, Ser: 3.98 mg/dL — ABNORMAL HIGH (ref 0.44–1.00)
GFR, Estimated: 13 mL/min — ABNORMAL LOW (ref >60)
Glucose, Bld: 182 mg/dL — ABNORMAL HIGH (ref 70–99)
Potassium: 4.9 mmol/L (ref 3.5–5.1)
Sodium: 134 mmol/L — ABNORMAL LOW (ref 135–145)

## 2020-03-30 LAB — CBC
HCT: 25.7 % — ABNORMAL LOW (ref 36.0–46.0)
Hemoglobin: 7.7 g/dL — ABNORMAL LOW (ref 12.0–15.0)
MCH: 28.8 pg (ref 26.0–34.0)
MCHC: 30 g/dL (ref 30.0–36.0)
MCV: 96.3 fL (ref 80.0–100.0)
Platelets: 468 10*3/uL — ABNORMAL HIGH (ref 150–400)
RBC: 2.67 MIL/uL — ABNORMAL LOW (ref 3.87–5.11)
RDW: 18.1 % — ABNORMAL HIGH (ref 11.5–15.5)
WBC: 20.9 10*3/uL — ABNORMAL HIGH (ref 4.0–10.5)
nRBC: 0.2 % (ref 0.0–0.2)

## 2020-03-30 LAB — PHOSPHORUS: Phosphorus: 3.2 mg/dL (ref 2.5–4.6)

## 2020-03-30 LAB — GLUCOSE, CAPILLARY
Glucose-Capillary: 110 mg/dL — ABNORMAL HIGH (ref 70–99)
Glucose-Capillary: 178 mg/dL — ABNORMAL HIGH (ref 70–99)
Glucose-Capillary: 188 mg/dL — ABNORMAL HIGH (ref 70–99)
Glucose-Capillary: 257 mg/dL — ABNORMAL HIGH (ref 70–99)
Glucose-Capillary: 369 mg/dL — ABNORMAL HIGH (ref 70–99)
Glucose-Capillary: 377 mg/dL — ABNORMAL HIGH (ref 70–99)

## 2020-03-30 LAB — MAGNESIUM: Magnesium: 2.3 mg/dL (ref 1.7–2.4)

## 2020-03-30 NOTE — Progress Notes (Signed)
Assisted tele visit to patient with daughter.  Sigmund Hazel, RN

## 2020-03-30 NOTE — Progress Notes (Signed)
IR aware of G-tube request. CT abd was ordered and anatomy favorable for percutaneous placement Chart reviewed New fevers and rising WBC noted today, workup in progress. IR will follow progress and plan for perc G-tube hopefully early next week.  Ascencion Dike PA-C Interventional Radiology 03/30/2020 12:46 PM

## 2020-03-30 NOTE — Progress Notes (Signed)
  Speech Language Pathology Treatment: [facility] Speaking valve  Patient Details Name: Wanda Ramirez MRN: [id number] DOB: 11-21-1970 Today's Date: 03/30/2020 Time: 2591-0289 SLP Time Calculation (min) (ACUTE ONLY): 8 min  Assessment / Plan / Recommendation Clinical Impression  Pt on trach collar and seen with speaking valve however she did not tolerate cuff deflation today. Her cough is strong and effective to expel from trach hub however cough continued and could not clear despite placement of valve to facilitate. Increased intensity with single attempt at phonation. O2 sats dropped to 86% not increasing until her cuff was reinflated. ST will continue effort.    HPI HPI: 49 yo admitted 9/13 with Covid PNA, intubated 9/24-10/6, reintubated 10/8, CRRT 9/26-10/7, trach and bronch 10/18. PMhx: ESRD TTS, obesity, HTN, GERD, hypothyroidism, DM, asthma      SLP Plan  Continue with current plan of care       Recommendations         Patient may use Passy-Muir Speech Valve: with SLP only PMSV Supervision: Full         Oral Care Recommendations: Oral care QID Follow up Recommendations: Skilled Nursing facility SLP Visit Diagnosis: Aphonia (R49.1) Plan: Continue with current plan of care                      [name] 03/30/2020, 2:40 PM   Orbie Pyo Colvin Caroli.Ed Risk analyst [phone] Office [phone]

## 2020-03-30 NOTE — Progress Notes (Addendum)
3pm: CSW received return call from patient's daughter [name] - CSW explained insurance barriers to [facility] placement and the current plan for placement at a vent SNF if unable to wean from the ventilator. CSW to follow up with [facility] with updates as they become available.   12pm: CSW attempted to reach patient's daughter to discuss the discharge planning process - no answer, a voicemail was left requesting a return call.  CSW spoke with Jaclyn Shaggy, LCSW Renal Navigator regarding this patient. Jaclyn Shaggy reports this patient will not be able to go back to her outpatient HD center without being decannulated or her trach is well healed and not requiring any suctioning.  Madilyn Fireman, MSW, LCSW-A Transitions of Care  Clinical Social Worker  [facility] Emergency Departments  Medical ICU [phone]

## 2020-03-30 NOTE — Progress Notes (Signed)
[facility] Progress Note   OP HD:TTS 4h 69mn 450/800 2/2.25 bath 111.5kg Hep 2000 L AVF - darbe 50 ug q week, last 9/7 - calc 1.0 tiw - 9/9 Hb 10.0, tsat 22%  48yo female w/ ESRD on TTS HD admitted for COVID PNA on 02/13/20. She was intubated. She had CRRT around 9/26- 10/7. Now is on intermittent HD. She is sp trach. She had MSSA pna. DM2.   Assessment/ Plan:   1. COVID pna /sp trach -trying to wean from vent 2. MSSA HCAP/ VAP - finished course of abx.  3. ESRD -usual HD TTS.sp CRRT 9/26- 10/7.  Last  HD 10/28 with UF 3L net  Plan next HD tomorrow.  4. BP/ volume -euvolemic on exam.  5. HD access: her LUA AVF clotted while here, will need new perm access when more stable. TDC x2 here by IR.  LIJ TC 6. 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 DM2 - per pmd  Subjective:   Denies f/c/n/v/cp. No events overnight. Tolerated HD yesterday.   Objective:   BP (!) 103/54   Pulse 97   Temp 99.8 F (37.7 C)   Resp 18   Ht '5\' 2"'  (1.575 m)   Wt 103.6 kg   SpO2 100%   BMI 41.77 kg/m   Intake/Output Summary (Last 24 hours) at 03/30/2020 0954 Last data filed at 03/30/2020 0600 Gross per 24 hour  Intake 1829.19 ml  Output 3250 ml  Net -1420.81 ml   Weight change: -1.8 kg  Physical Exam: More alert, NG in , +trach CVS- tachy RS- CTA ventilator dependent ABD- BS present soft non-distended  EXT- no edema LE's, lt arm access thrombosed ACCESS: LIJ TC  Imaging: CT ABDOMEN WO CONTRAST  Result Date: 03/30/2020 CLINICAL DATA:  49year old female with history of COVID-19 pneumonia and dysphagia. Evaluation for gastrostomy tube placement. EXAM: CT ABDOMEN WITHOUT CONTRAST TECHNIQUE: Multidetector CT imaging of the abdomen was performed following the standard protocol without IV contrast. COMPARISON:  10/10/2019 FINDINGS: Lower chest: Hemodialysis catheter  tip located within the floor of the right atrium. Scattered ground-glass and consolidative of opacities visualized in the lung bases. Cystic space in the anterior, subpleural right middle lobe with multiple septations favoring complicated pneumatocele. The heart is normal size. No pericardial effusion. Hepatobiliary: The liver is normal in size, contour, and attenuation. No apparent focal lesion. Status post cholecystectomy. No intra or extrahepatic biliary ductal dilation. Pancreas: Unremarkable. No pancreatic ductal dilatation or surrounding inflammatory changes. Spleen: Normal in size without focal abnormality. Adrenals/Urinary Tract: Adrenal glands are unremarkable. Kidneys symmetric in size. Unchanged right upper pole punctate nonobstructive nephrolithiasis. No hydronephrosis. Stomach/Bowel: Weighted tip feeding tube courses through the stomach coils in the pylorus with the tip terminating in the gastric antrum. The stomach demonstrates favorable anatomy for percutaneous gastrostomy tube placement. No distension or bowel wall thickening appreciated throughout the visualized small and large bowel. Vascular/Lymphatic: No intra-abdominal lymphadenopathy. Scattered atherosclerotic calcification of the bilateral common iliac arteries. The aorta is normal in caliber. Other: No ascites.  No abdominal wall hernia. Musculoskeletal: No acute or significant osseous findings. IMPRESSION: 1. No acute intra-abdominal abnormality. Favorable gastric anatomy for percutaneous gastrostomy tube placement. 2. Scattered ground-glass and consolidative pulmonary opacities in the visualized lung bases compatible with sequela of known COVID 19 associated pneumonia. Interval development of complex pneumatocele in the anterior right middle lobe. 3. Unchanged nonobstructive  right upper pole punctate nephrolithiasis. Ruthann Cancer, MD [facility] Radiology Electronically Signed   By: Ruthann Cancer MD   On: 03/30/2020 08:33   DG Chest Port 1 View  Result Date: 03/29/2020 CLINICAL DATA:  Respiratory failure. EXAM: PORTABLE CHEST 1 VIEW COMPARISON:  03/27/2020 FINDINGS: 0359 hours. Low lung volumes with diffuse bilateral airspace disease, right greater than left. No pneumothorax or substantial pleural effusion. The cardio pericardial silhouette is enlarged. Tracheostomy tube remains in place. A feeding tube passes into the stomach although the distal tip position is not included on the film. Left IJ central line tip overlies the upper to mid right atrium. IMPRESSION: Low volume film with diffuse bilateral airspace disease, right greater than left. No substantial interval change. Electronically Signed   By: Misty Stanley M.D.   On: 03/29/2020 06:45    Labs: BMET Recent Labs  Lab 03/24/20 1120 03/25/20 0650 03/26/20 0226 03/27/20 0623 03/28/20 0922 03/29/20 0245 03/30/20 0139  NA 134* 135 135 135 134* 134* 134*  K 3.6 3.8 4.4 4.0 3.4* 3.5 4.9  CL 97* 97* 97* 95* 96* 95* 94*  CO2 '27 26 24 25 23 24 25  ' GLUCOSE 167* 153* 184* 168* 223* 94 182*  BUN 19 37* 52* 72* 43* 58* 44*  CREATININE 2.43* 4.30* 5.92* 7.72* 4.92* 6.26* 3.98*  CALCIUM 8.3* 9.5 9.8 10.3 10.0 10.4* 9.7  PHOS  --  4.6  --  6.2*  --  5.2* 3.2   CBC Recent Labs  Lab 03/25/20 2343 03/25/20 2343 03/27/20 0623 03/28/20 0922 03/29/20 0245 03/30/20 0139  WBC 22.7*   < > 19.4* 17.4* 17.8* 20.9*  NEUTROABS 19.0*  --   --   --   --   --   HGB 7.5*   < > 7.2* 7.5* 7.0* 7.7*  HCT 23.9*   < > 23.0* 24.1* 21.9* 25.7*  MCV 95.2   < > 94.7 94.9 93.6 96.3  PLT 499*   < > 503* 496* 452* 468*   < > = values in this interval not displayed.    Medications:    . B-complex with vitamin C  1 tablet Per Tube Daily  . chlorhexidine gluconate (MEDLINE KIT)  15 mL Mouth Rinse BID  . Chlorhexidine Gluconate Cloth  6 each Topical Q0600  . clonazepam  0.5 mg Per Tube TID  . cloNIDine  0.3 mg Oral Q6H  . collagenase    Topical Daily  . darbepoetin (ARANESP) injection - DIALYSIS  150 mcg Intravenous Q Tue-HD  . feeding supplement (PROSource TF)  45 mL Per Tube BID  . fiber  1 packet Per Tube BID  . heparin injection (subcutaneous)  5,000 Units Subcutaneous Q8H  . hydrocerin   Topical Daily  . insulin aspart  0-20 Units Subcutaneous Q4H  . insulin aspart  5 Units Subcutaneous Q4H  . insulin detemir  12 Units Subcutaneous BID  . levothyroxine  125 mcg Per Tube Q0600  . mouth rinse  15 mL Mouth Rinse 10 times per day  . oxyCODONE  10 mg Per Tube Q6H  . pantoprazole sodium  40 mg Per Tube BID  . QUEtiapine  100 mg Per Tube BID  . sodium chloride flush  10-40 mL Intracatheter Q12H  . sodium chloride flush  3 mL Intravenous Q12H      Otelia Santee, MD 03/30/2020, 9:54 AM

## 2020-03-30 NOTE — Progress Notes (Signed)
NAME:  Wanda Ramirez, MRN:  [id number], DOB:  [birth year], LOS: 74 ADMISSION DATE:  02/13/2020, CONSULTATION DATE:  9/24 REFERRING MD:  Heber Wheeler, CHIEF COMPLAINT:  Dyspnea   Brief History   49 yo female admitted with COVID 19 pneumonia on 9/13, had been diagnosed with COVID on 9/9.  On 9/24 her condition worsened and PCCM was consulted, she was sent to the ICU and treated with BIPAP, then eventually intubated.  Started on CRRT through a tunneled HD cath. Extubated on 10/6 but re-intubated on 10/8.  Has been challenged by sedation needs since admission.   Past Medical History  Sleep apnea Morbid obesity Hypothyroidism Hypertension GERD  End-stage renal disease on HD MWF Diabetes Asthma  Significant Hospital Events   Admitted 9/14 9/24 - Lying in bed on side currently undergoing iHD,  She is lethargic but will arouse to verbal stimuli but quickly falls back to sleep.  RN states patient has had progressive decline over the last 12hrs 9/25 - -currently in medical ICU to [facility].  Bed 12.  She has been on BiPAP with Precedex overnight.  This morning on the low-dose of Precedex she got agitated.?  Also had melena.  She also desaturated.  Nursing then increase her Precedex and since then she has been more stable.  Yesterday dialysis was cut short.  9/26 -unable to run CRRT with prone ventilation.  Resume supine ventilation but still unable to draw blood back. 9/27 transferred to 19m. IR replaced HD catheter 10/1 on CRRT and vasopressors 10/6 - extubated 10/7 - ad some stridor and increased WOB so placed on BIPAP and dexamethasone given.  10/15 weaning sedation. midaz off 10/16 move to 3M 10/18 s/p 6 proximal XLT Shiley tracheostomy  10/19: Resumed Eliquis low dose. Per Dr. Thompson Caul note 10/7 will need to be on [facility] for 1 month then reasses 10/19: Episode of hypoglycemia 55. 10/21 hypoglycemic again. levemir and tubefeed coverage decreased. Low grade temp->sputum culture send had vomiting  event on 10/19 w/ concern for aspiration. Fent was stopped 10/20. Still gets anxious at night. Required increased precedex. Added clonazepam. 10/22 attempting PSV trials  10/25 precedex drip weaned off 10/27 precedex restarted 10/28 clonopin and seroquel added to attempt to get back off precedex gtt  Consults:  PCCM Nephrology  Procedures:  9/23 tunneled HD cath placement >  10/8 ETT > 10/18 10/18 6 proximal XLT Shiley tracheostomy  >  Significant Diagnostic Tests:  9/21 Vas ultrasound> thrombosed LUE fistula 9/21 VQ scan > negative for PE 9/23 echo> LVEF 60-65%, flat ventrcile consistent with RV pressure overload, RV severely enlarged, moderate reduction in RV function  Micro Data:  9/13 blood > neg   9/14 sars cov 2 > pos 9/26 blood > neg 10/8 resp > MSSA 10/8 blood > negative  10/21 sputum > Normal flora  10/25 Blood > ngtd x 3 days 10/25 Sputum  > few diphtheroids   Antimicrobials:  9/14 remdesivir > 9/19 9/15 tocilizumab > x1 9/20 cefepime > 9/29 9/20 azithro >  9/25 10/8 vanc > 10/9 10/8 zosyn > x1 10/10 ceftriaxone > 10/16  Interim history/subjective:  No acute events. Precedex down to 0.2. Did not tolerate ATC yesterday due to agitation, have asked RN and RT to attempt again today.  Objective   Blood pressure 108/66, pulse 99, temperature 99.8 F (37.7 C), resp. rate 18, height 5\' 2"  (1.575 m), weight 103.6 kg, SpO2 100 %.    Vent Mode: CPAP;PSV FiO2 (%):  [35 %-50 %]  40 % Set Rate:  [18 bmp] 18 bmp Vt Set:  [400 mL] 400 mL PEEP:  [5 cmH20-10 cmH20] 5 cmH20 Pressure Support:  [10 cmH20] 10 cmH20   Intake/Output Summary (Last 24 hours) at 03/30/2020 6803 Last data filed at 03/30/2020 0600 Gross per 24 hour  Intake 1829.19 ml  Output 3250 ml  Net -1420.81 ml   Filed Weights   03/29/20 0730 03/29/20 1202 03/30/20 0434  Weight: 106 kg 102.5 kg 103.6 kg   Examination: General:  Chronically ill appearing female, in NAD HEENT: MM pink/moist, right  nare cortrak, midline prox XLT 6 shiley Neuro: will open eyes and follow intermittent commands, lightly squeezes hand, flicker of movement in toes CV: ST, distant heart sounds, left chest HD cath site wnl PULM:  Bilateral basilar rhonchi GI: soft, bs active Extremities: warm/dry, no LE edema  Skin: no rashes    Resolved Hospital Problem list   Thrombocytopenia  MSSA PNA  Nausea   Assessment & Plan:   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  P: Full MV support with daily PSV/ ATC trials as able VAP bundle Ongoing trach care Intermittent CXR  Fever with leukocytosis concern for VAP vs bronchitis P: Resend trach aspirate (10/25 with few diptheroids) Follow BC Continue to trend WBC/ fever curve Monitor clinically for now off abx to prevent MDR colonization  ESRD on iHD P: Nephrology following, continues on iHD  Trend BMET Longterm access per Nephrology   Acute metabolic encephalopathy w/ on-going need for sedation on mechanical ventilation -This is an ongoing challenging issue. P: RASS goal 0 Continue precedex but attempt to wean back to off (very responsive to this) Continue clonidine to help facilitate precedex wean, wean gradually Continue klonopin, seroquel Continue home oxy Encourage day night cycles  Anemia of critical and chronic illness -No evidence of current bleeding.  P: Trend CBC Transfuse for hgb >7 No signs of bleeding  Hypothyroidism P: Continue Synthroid   DM2 with both hypo and hyperglycemia P: Continue SSI resistant  TF coverage 5 units q 4hr Continue levemir 12 units BID   Stage 2 sacral pressure ulcer P: Wound care Frequent turn Ensure adequate renal perfusion WOC folowing  Nutrition P: Continue TF's PEG planned  Best practice:  Diet: tube feeding Pain/Anxiety/Delirium protocol (if indicated): as above VAP protocol (if indicated): yes DVT prophylaxis: Heparin [facility]  prophylaxis: PPI Glucose control: SSI, levemir Mobility: bed rest Code Status: full Family Communication: Please call [name] (Daughter) with updates. Pending 10/29. Disposition: ICU  Critical care time: 30 mins   Montey Hora, [place] [facility] 03/30/2020, 9:14 AM

## 2020-03-31 DIAGNOSIS — U071 COVID-19: Secondary | ICD-10-CM | POA: Diagnosis not present

## 2020-03-31 LAB — GLUCOSE, CAPILLARY
Glucose-Capillary: 168 mg/dL — ABNORMAL HIGH (ref 70–99)
Glucose-Capillary: 221 mg/dL — ABNORMAL HIGH (ref 70–99)
Glucose-Capillary: 268 mg/dL — ABNORMAL HIGH (ref 70–99)
Glucose-Capillary: 239 mg/dL — ABNORMAL HIGH (ref 70–99)

## 2020-03-31 LAB — MAGNESIUM: Magnesium: 2.5 mg/dL — ABNORMAL HIGH (ref 1.7–2.4)

## 2020-03-31 LAB — CULTURE, BLOOD (ROUTINE X 2)
Culture: NO GROWTH
Culture: NO GROWTH
Special Requests: ADEQUATE

## 2020-03-31 LAB — CBC
HCT: 25.1 % — ABNORMAL LOW (ref 36.0–46.0)
Hemoglobin: 7.8 g/dL — ABNORMAL LOW (ref 12.0–15.0)
MCH: 28.4 pg (ref 26.0–34.0)
MCHC: 31.1 g/dL (ref 30.0–36.0)
MCV: 91.3 fL (ref 80.0–100.0)
Platelets: 419 10*3/uL — ABNORMAL HIGH (ref 150–400)
RBC: 2.75 MIL/uL — ABNORMAL LOW (ref 3.87–5.11)
RDW: 18.2 % — ABNORMAL HIGH (ref 11.5–15.5)
WBC: 23.6 10*3/uL — ABNORMAL HIGH (ref 4.0–10.5)
nRBC: 0.3 % — ABNORMAL HIGH (ref 0.0–0.2)

## 2020-03-31 LAB — BASIC METABOLIC PANEL
Anion gap: 16 — ABNORMAL HIGH (ref 5–15)
BUN: 75 mg/dL — ABNORMAL HIGH (ref 6–20)
CO2: 24 mmol/L (ref 22–32)
Calcium: 10.6 mg/dL — ABNORMAL HIGH (ref 8.9–10.3)
Chloride: 92 mmol/L — ABNORMAL LOW (ref 98–111)
Creatinine, Ser: 5.4 mg/dL — ABNORMAL HIGH (ref 0.44–1.00)
GFR, Estimated: 9 mL/min — ABNORMAL LOW (ref >60)
Glucose, Bld: 156 mg/dL — ABNORMAL HIGH (ref 70–99)
Potassium: 5.2 mmol/L — ABNORMAL HIGH (ref 3.5–5.1)
Sodium: 132 mmol/L — ABNORMAL LOW (ref 135–145)

## 2020-03-31 LAB — PHOSPHORUS: Phosphorus: 3.9 mg/dL (ref 2.5–4.6)

## 2020-03-31 MED ORDER — CLONIDINE HCL 0.2 MG PO TABS: 0.1000 mg | ORAL_TABLET | Freq: Four times a day (QID) | ORAL | Status: DC

## 2020-03-31 MED ORDER — CLONIDINE HCL 0.2 MG PO TABS: 0.1000 mg | ORAL_TABLET | Freq: Two times a day (BID) | ORAL | Status: DC

## 2020-03-31 MED ORDER — CLONIDINE HCL 0.2 MG PO TABS
0.1000 mg | ORAL_TABLET | Freq: Four times a day (QID) | ORAL | Status: AC
  Filled 2020-03-31: qty 1

## 2020-03-31 MED ORDER — CLONIDINE HCL 0.2 MG PO TABS
0.1000 mg | ORAL_TABLET | Freq: Two times a day (BID) | ORAL | Status: AC
  Filled 2020-03-31: qty 1

## 2020-03-31 MED ORDER — CLONIDINE HCL 0.2 MG PO TABS: 0.2000 mg | ORAL_TABLET | Freq: Four times a day (QID) | ORAL | Status: DC

## 2020-03-31 MED ORDER — ALBUMIN HUMAN 25 % IV SOLN
INTRAVENOUS | Status: AC
  Administered 2020-03-31: 25 g via INTRAVENOUS
  Filled 2020-03-31: qty 100

## 2020-03-31 MED ORDER — HEPARIN SODIUM (PORCINE) 1000 UNIT/ML IJ SOLN
INTRAMUSCULAR | Status: AC
  Administered 2020-03-31: 4200 [IU] via INTRAVENOUS_CENTRAL
  Filled 2020-03-31: qty 5

## 2020-03-31 MED ORDER — CLONIDINE HCL 0.2 MG PO TABS: 0.1000 mg | ORAL_TABLET | Freq: Every day | ORAL | Status: DC

## 2020-03-31 MED ORDER — MIDODRINE HCL 5 MG PO TABS
10.0000 mg | ORAL_TABLET | Freq: Once | ORAL | Status: AC
  Administered 2020-03-31: 10 mg
  Filled 2020-03-31: qty 2

## 2020-03-31 MED ORDER — ALBUMIN HUMAN 25 % IV SOLN: 25.0000 g | Freq: Once | INTRAVENOUS | Status: AC

## 2020-03-31 NOTE — Progress Notes (Signed)
[facility] Progress Note   OP HD:TTS 4h 97mn 450/800 2/2.25 bath 111.5kg Hep 2000 L AVF - darbe 50 ug q week, last 9/7 - calc 1.0 tiw - 9/9 Hb 10.0, tsat 22%  49yo female w/ ESRD on TTS HD admitted for COVID PNA on 02/13/20. She was intubated. She had CRRT around 9/26- 10/7. Now is on intermittent HD. She is sp trach. She had MSSA pna. DM2.  Assessment/ Plan:   1. COVID pna /sp trach -trying to wean from vent 2. MSSA HCAP/ VAP - finished course of abx.  3. ESRD -usual HD TTS.sp CRRT 9/26- 10/7.  Last  HD 10/28 with UF 3L net  Seen on  HD today BP very soft and had to turn off UF; after rinseback will only have 300 mL net UF Already given Albumin and will give midodrine as well.  Partially from Clonidine; hold before HD next week. If possible titrate down as tolerated o/w will be difficult to UF.  4. BP/ volume -euvolemic on exam. 5. HD access: her LUA AVF clotted while here, will need new perm access when more stable. TDC x2 here by IR.LIJ TC 6. 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 Phos 3.9 10/30 8. DM2 - per pmd  Subjective:   BP dropped with HD today. Awaiting possible G-tube placement   Objective:   BP 90/64   Pulse (!) 110   Temp 99.9 F (37.7 C) (Core)   Resp 16   Ht '5\' 2"'  (1.575 m)   Wt 104.9 kg Comment: bed  SpO2 100%   BMI 42.30 kg/m   Intake/Output Summary (Last 24 hours) at 03/31/2020 0950 Last data filed at 03/31/2020 0900 Gross per 24 hour  Intake 1598.44 ml  Output --  Net 1598.44 ml   Weight change:   Physical Exam: More alert, NG in , +trach CVS-tachy RS- CTA ventilator dependent ABD- BS present soft non-distended  EXT- no edema LE's, lt arm access thrombosed ACCESS: LIJ TC  Imaging: CT ABDOMEN WO CONTRAST  Result Date: 03/30/2020 CLINICAL DATA:  49year old female with history of COVID-19 pneumonia and  dysphagia. Evaluation for gastrostomy tube placement. EXAM: CT ABDOMEN WITHOUT CONTRAST TECHNIQUE: Multidetector CT imaging of the abdomen was performed following the standard protocol without IV contrast. COMPARISON:  10/10/2019 FINDINGS: Lower chest: Hemodialysis catheter tip located within the floor of the right atrium. Scattered ground-glass and consolidative of opacities visualized in the lung bases. Cystic space in the anterior, subpleural right middle lobe with multiple septations favoring complicated pneumatocele. The heart is normal size. No pericardial effusion. Hepatobiliary: The liver is normal in size, contour, and attenuation. No apparent focal lesion. Status post cholecystectomy. No intra or extrahepatic biliary ductal dilation. Pancreas: Unremarkable. No pancreatic ductal dilatation or surrounding inflammatory changes. Spleen: Normal in size without focal abnormality. Adrenals/Urinary Tract: Adrenal glands are unremarkable. Kidneys symmetric in size. Unchanged right upper pole punctate nonobstructive nephrolithiasis. No hydronephrosis. Stomach/Bowel: Weighted tip feeding tube courses through the stomach coils in the pylorus with the tip terminating in the gastric antrum. The stomach demonstrates favorable anatomy for percutaneous gastrostomy tube placement. No distension or bowel wall thickening appreciated throughout the visualized small and large bowel. Vascular/Lymphatic: No intra-abdominal lymphadenopathy. Scattered atherosclerotic calcification of the bilateral common iliac arteries. The aorta is normal in caliber. Other: No ascites.  No abdominal wall hernia. Musculoskeletal: No acute or significant osseous findings. IMPRESSION:  1. No acute intra-abdominal abnormality. Favorable gastric anatomy for percutaneous gastrostomy tube placement. 2. Scattered ground-glass and consolidative pulmonary opacities in the visualized lung bases compatible with sequela of known COVID 19 associated pneumonia.  Interval development of complex pneumatocele in the anterior right middle lobe. 3. Unchanged nonobstructive right upper pole punctate nephrolithiasis. Ruthann Cancer, MD [facility] Radiology Electronically Signed   By: Ruthann Cancer MD   On: 03/30/2020 08:33    Labs: BMET Recent Labs  Lab 03/25/20 9417 03/26/20 4081 03/27/20 4481 03/28/20 8563 03/29/20 0245 03/30/20 0139 03/31/20 0119  NA 135 135 135 134* 134* 134* 132*  K 3.8 4.4 4.0 3.4* 3.5 4.9 5.2*  CL 97* 97* 95* 96* 95* 94* 92*  CO2 '26 24 25 23 24 25 24  ' GLUCOSE 153* 184* 168* 223* 94 182* 156*  BUN 37* 52* 72* 43* 58* 44* 75*  CREATININE 4.30* 5.92* 7.72* 4.92* 6.26* 3.98* 5.40*  CALCIUM 9.5 9.8 10.3 10.0 10.4* 9.7 10.6*  PHOS 4.6  --  6.2*  --  5.2* 3.2 3.9   CBC Recent Labs  Lab 03/25/20 2343 03/27/20 0623 03/28/20 0922 03/29/20 0245 03/30/20 0139 03/31/20 0119  WBC 22.7*   < > 17.4* 17.8* 20.9* 23.6*  NEUTROABS 19.0*  --   --   --   --   --   HGB 7.5*   < > 7.5* 7.0* 7.7* 7.8*  HCT 23.9*   < > 24.1* 21.9* 25.7* 25.1*  MCV 95.2   < > 94.9 93.6 96.3 91.3  PLT 499*   < > 496* 452* 468* 419*   < > = values in this interval not displayed.    Medications:    . B-complex with vitamin C  1 tablet Per Tube Daily  . chlorhexidine gluconate (MEDLINE KIT)  15 mL Mouth Rinse BID  . Chlorhexidine Gluconate Cloth  6 each Topical Q0600  . clonazepam  0.5 mg Per Tube TID  . cloNIDine  0.3 mg Oral Q6H  . collagenase   Topical Daily  . darbepoetin (ARANESP) injection - DIALYSIS  150 mcg Intravenous Q Tue-HD  . feeding supplement (PROSource TF)  45 mL Per Tube BID  . fiber  1 packet Per Tube BID  . heparin injection (subcutaneous)  5,000 Units Subcutaneous Q8H  . heparin sodium (porcine)      . hydrocerin   Topical Daily  . insulin aspart  0-20 Units Subcutaneous Q4H  . insulin aspart  5 Units Subcutaneous Q4H  . insulin detemir  12 Units Subcutaneous BID  .  levothyroxine  125 mcg Per Tube Q0600  . mouth rinse  15 mL Mouth Rinse 10 times per day  . midodrine  10 mg Per Tube Once  . oxyCODONE  10 mg Per Tube Q6H  . pantoprazole sodium  40 mg Per Tube BID  . QUEtiapine  100 mg Per Tube BID  . sodium chloride flush  10-40 mL Intracatheter Q12H  . sodium chloride flush  3 mL Intravenous Q12H      Otelia Santee, MD 03/31/2020, 9:50 AM

## 2020-03-31 NOTE — Progress Notes (Signed)
   03/31/20 1115  Vitals  Temp 98.9 F (37.2 C)  Temp Source Core  BP (!) 97/49  MAP (mmHg) (!) 64  Pulse Rate (!) 118  ECG Heart Rate (!) 119  Resp (!) 22  Oxygen Therapy  SpO2 100 %  O2 Device Ventilator  Post-Hemodialysis Assessment  Rinseback Volume (mL) 250 mL  KECN 278 V  Dialyzer Clearance Lightly streaked  Duration of HD Treatment -hour(s) 4 hour(s)  Hemodialysis Intake (mL) 700 mL  UF Total -Machine (mL) 1000 mL  Net UF (mL) 300 mL  Tolerated HD Treatment No (Comment) (see progress noted)  Post-Hemodialysis Comments tx complete-pt stable  Hemodialysis Catheter Left Internal jugular Double lumen Permanent (Tunneled)  Placement Date/Time: 02/27/20 1844   Time Out: Correct patient;Correct site;Correct procedure  Maximum sterile barrier precautions: Hand hygiene;Cap;Mask;Sterile gown;Large sterile sheet  Site Prep: Chlorhexidine (preferred)  Local Anesthetic: Injecta...  Site Condition No complications  Blue Lumen Status Flushed;Capped (Central line);Heparin locked  Red Lumen Status Capped (Central line);Heparin locked;Flushed  Catheter fill solution Heparin 1000 units/ml  Catheter fill volume (Arterial) 2.1 cc  Catheter fill volume (Venous) 2.1  Dressing Type Occlusive  Dressing Status Clean;Dry;Intact;Antimicrobial disc in place  Drainage Description None  Dressing Change Due 04/03/20  Post treatment catheter status Capped and Clamped  HD tx complete-pt stable. Persistent hypotension noted throughout tx, with SBP running 60s-low 90s. Pt given albumin, dialysate temp lowered, uf decreased and intermittently paused-minimal effects noted. CCMD and Dr. Augustin Coupe at bedside to assess. Midodrine 10mg  per tube ordered per Dr. Augustin Coupe, primary nurse made aware for administration, awaiting pharmacy review. Pt noted to have received scheduled clonidine this am, prior to hd tx. Primary nurse encouraged and agreed to report to hold antihypertensive meds prior to dialysis txs in the future.  UF=343ml net.

## 2020-03-31 NOTE — Progress Notes (Signed)
NAME:  Wanda Ramirez, MRN:  [id number], DOB:  1970-10-14, LOS: 25 ADMISSION DATE:  02/13/2020, CONSULTATION DATE:  9/24 REFERRING MD:  Heber Sparkman, CHIEF COMPLAINT:  Dyspnea   Brief History   49 yo female admitted with COVID 19 pneumonia on 9/13, had been diagnosed with COVID on 9/9.  On 9/24 her condition worsened and PCCM was consulted, she was sent to the ICU and treated with BIPAP, then eventually intubated.  Started on CRRT through a tunneled HD cath. Extubated on 10/6 but re-intubated on 10/8.  Has been challenged by sedation needs since admission.   Past Medical History  Sleep apnea Morbid obesity Hypothyroidism Hypertension GERD  End-stage renal disease on HD MWF Diabetes Asthma  Significant Hospital Events   9/14 admit 9/25 on BiPAP with Precedex overnight 9/26 -unable to run CRRT with prone ventilation.  Resume supine ventilation but still unable to draw blood back. 9/27 transferred to 5m. IR replaced HD catheter 10/1 on CRRT and vasopressors 10/6 - extubated 10/7 - had some stridor and increased WOB so placed on BIPAP and dexamethasone given.  10/15 weaning sedation. midaz off 10/16 move to 21M 10/18 s/p 6 proximal XLT Shiley tracheostomy  10/19: Resumed Eliquis low dose. Per Dr. Thompson Caul note 10/7 will need to be on [facility] for 1 month then reasses 10/19: Episode of hypoglycemia 55. 10/21 hypoglycemic again. levemir and tubefeed coverage decreased. Low grade temp->sputum culture send had vomiting event on 10/19 w/ concern for aspiration. Fent was stopped 10/20. Still gets anxious at night. Required increased precedex. Added clonazepam. 10/22 attempting PSV trials  10/25 precedex drip weaned off 10/27 precedex restarted 10/28 clonopin and seroquel added to attempt to get back off precedex gtt  Consults:  Nephrology  Procedures:  9/23 tunneled HD cath placement >  10/8 ETT > 10/18 10/18 6 proximal XLT Shiley tracheostomy  >  Significant Diagnostic Tests:  9/21 Vas  ultrasound> thrombosed LUE fistula 9/21 VQ scan > negative for PE 9/29 echo> LVEF 60-65%, flat ventrcile consistent with RV pressure overload, RV severely enlarged, moderate reduction in RV function  Micro Data:  9/13 blood > neg   9/14 sars cov 2 > pos 9/26 blood > neg 10/8 resp > MSSA 10/8 blood > negative  10/21 sputum > Normal flora  10/25 Blood > ngtd x 3 days 10/25 Sputum  > few diphtheroids  10/25 Blood >> 10/29 Sputum >>   Antimicrobials:  9/14 remdesivir > 9/19 9/15 tocilizumab > x1 9/20 cefepime > 9/29 9/20 azithro >  9/25 10/8 vanc > 10/9 10/8 zosyn > x1 10/10 ceftriaxone > 10/16  Interim history/subjective:  Low BP with HD.  Objective   Blood pressure 90/64, pulse (!) 110, temperature 99.9 F (37.7 C), temperature source Core, resp. rate 16, height 5\' 2"  (1.575 m), weight 104.9 kg, SpO2 100 %.    Vent Mode: PRVC FiO2 (%):  [35 %-40 %] 40 % Set Rate:  [18 bmp] 18 bmp Vt Set:  [400 mL] 400 mL PEEP:  [8 cmH20] 8 cmH20   Intake/Output Summary (Last 24 hours) at 03/31/2020 0936 Last data filed at 03/31/2020 0900 Gross per 24 hour  Intake 1598.44 ml  Output --  Net 1598.44 ml   Filed Weights   03/29/20 1202 03/30/20 0434 03/31/20 0708  Weight: 102.5 kg 103.6 kg 104.9 kg   Examination:  General - sedated Eyes - pupils reactive ENT - trach site clean Cardiac - regular, tachycardic Chest - b/l crackles Abdomen - soft, non tender, +  bowel sounds Extremities - 1+ edema Skin - no rashes Neuro - RASS -1   Resolved Hospital Problem list   Thrombocytopenia  MSSA PNA  Nausea   Assessment & Plan:   Acute hypoxic respiratory failure from COVID 19 pneumonia with ARDS complicated by MSSA PNA. Failure to wean s/p tracheostomy. - pressure support wean to TC as able - f/u CXR intermittently - trach care  ESRD. - iHD per nephrology  Hypotension 03/31/20. - likely from meds and volume removal with HD - goal SBP > 90  Fever on 03/29/20. - none  since - f/u cultures - monitor off ABx for now  Acute metabolic encephalopathy in setting of sepsis. Agitated delirium. - RASS goal 0 to -1 - continue klonopin, catapres, oxycodone, seroquel  Anemia of critical and chronic illness. - f/u CBC - transfuse for Hb < 7 or significant bleeding  Hypothyroidism. - continue synthroid  DM type 2 with labile blood sugars. - SSI with lantus and tube feed coverage  Stage 2 sacral pressure ulcer. - wound care  Dysphagia. - IR arranging for PEG  Best practice:  Diet: tube feeding DVT prophylaxis: Heparin [place] GI prophylaxis: Protonix Mobility: bed rest Code Status: full Disposition: ICU  Labs:   CMP Latest Ref Rng & Units 03/31/2020 03/30/2020 03/29/2020  Glucose 70 - 99 mg/dL 156(H) 182(H) 94  BUN 6 - 20 mg/dL 75(H) 44(H) 58(H)  Creatinine 0.44 - 1.00 mg/dL 5.40(H) 3.98(H) 6.26(H)  Sodium 135 - 145 mmol/L 132(L) 134(L) 134(L)  Potassium 3.5 - 5.1 mmol/L 5.2(H) 4.9 3.5  Chloride 98 - 111 mmol/L 92(L) 94(L) 95(L)  CO2 22 - 32 mmol/L 24 25 24   Calcium 8.9 - 10.3 mg/dL 10.6(H) 9.7 10.4(H)  Total Protein 6.5 - 8.1 g/dL - - -  Total Bilirubin 0.3 - 1.2 mg/dL - - -  Alkaline Phos 38 - 126 U/L - - -  AST 15 - 41 U/L - - -  ALT 0 - 44 U/L - - -    CBC Latest Ref Rng & Units 03/31/2020 03/30/2020 03/29/2020  WBC 4.0 - 10.5 K/uL 23.6(H) 20.9(H) 17.8(H)  Hemoglobin 12.0 - 15.0 g/dL 7.8(L) 7.7(L) 7.0(L)  Hematocrit 36 - 46 % 25.1(L) 25.7(L) 21.9(L)  Platelets 150 - 400 K/uL 419(H) 468(H) 452(H)    ABG    Component Value Date/Time   PHART 7.327 (L) 03/13/2020 1135   PCO2ART 47.5 03/13/2020 1135   PO2ART 122 (H) 03/13/2020 1135   HCO3 24.7 03/13/2020 1135   TCO2 26 03/13/2020 1135   ACIDBASEDEF 1.0 03/13/2020 1135   O2SAT 98.0 03/13/2020 1135    CBG (last 3)  Recent Labs    03/30/20 1943 03/30/20 2345 03/31/20 0332  GLUCAP 178* 110* 168*    Signature:  Chesley Mires, MD [place] Pager - [phone] 03/31/2020, 9:47 AM

## 2020-04-01 ENCOUNTER — Inpatient Hospital Stay (HOSPITAL_COMMUNITY): Payer: Medicaid Other

## 2020-04-01 DIAGNOSIS — J9601 Acute respiratory failure with hypoxia: Secondary | ICD-10-CM | POA: Diagnosis not present

## 2020-04-01 DIAGNOSIS — J189 Pneumonia, unspecified organism: Secondary | ICD-10-CM

## 2020-04-01 LAB — CBC
HCT: 24.2 % — ABNORMAL LOW (ref 36.0–46.0)
Hemoglobin: 7.6 g/dL — ABNORMAL LOW (ref 12.0–15.0)
MCH: 29.2 pg (ref 26.0–34.0)
MCHC: 31.4 g/dL (ref 30.0–36.0)
MCV: 93.1 fL (ref 80.0–100.0)
Platelets: 418 10*3/uL — ABNORMAL HIGH (ref 150–400)
RBC: 2.6 MIL/uL — ABNORMAL LOW (ref 3.87–5.11)
RDW: 18.3 % — ABNORMAL HIGH (ref 11.5–15.5)
WBC: 20.1 10*3/uL — ABNORMAL HIGH (ref 4.0–10.5)
nRBC: 0.3 % — ABNORMAL HIGH (ref 0.0–0.2)

## 2020-04-01 LAB — GLUCOSE, CAPILLARY
Glucose-Capillary: 182 mg/dL — ABNORMAL HIGH (ref 70–99)
Glucose-Capillary: 195 mg/dL — ABNORMAL HIGH (ref 70–99)
Glucose-Capillary: 202 mg/dL — ABNORMAL HIGH (ref 70–99)
Glucose-Capillary: 214 mg/dL — ABNORMAL HIGH (ref 70–99)
Glucose-Capillary: 217 mg/dL — ABNORMAL HIGH (ref 70–99)
Glucose-Capillary: 226 mg/dL — ABNORMAL HIGH (ref 70–99)

## 2020-04-01 LAB — BASIC METABOLIC PANEL
Anion gap: 13 (ref 5–15)
BUN: 42 mg/dL — ABNORMAL HIGH (ref 6–20)
CO2: 27 mmol/L (ref 22–32)
Calcium: 10.2 mg/dL (ref 8.9–10.3)
Chloride: 93 mmol/L — ABNORMAL LOW (ref 98–111)
Creatinine, Ser: 3.05 mg/dL — ABNORMAL HIGH (ref 0.44–1.00)
GFR, Estimated: 18 mL/min — ABNORMAL LOW (ref >60)
Glucose, Bld: 255 mg/dL — ABNORMAL HIGH (ref 70–99)
Potassium: 4.3 mmol/L (ref 3.5–5.1)
Sodium: 133 mmol/L — ABNORMAL LOW (ref 135–145)

## 2020-04-01 MED ORDER — VANCOMYCIN HCL 10 G IV SOLR
2000.0000 mg | Freq: Once | INTRAVENOUS | Status: DC
  Filled 2020-04-01: qty 2000

## 2020-04-01 MED ORDER — MIDODRINE HCL 5 MG PO TABS
10.0000 mg | ORAL_TABLET | Freq: Once | ORAL | Status: AC
  Administered 2020-04-02: 10 mg via ORAL
  Filled 2020-04-01: qty 2

## 2020-04-01 MED ORDER — VANCOMYCIN HCL 10 G IV SOLR: 2000.0000 mg | Freq: Once | INTRAVENOUS | Status: DC

## 2020-04-01 MED ORDER — VANCOMYCIN VARIABLE DOSE PER UNSTABLE RENAL FUNCTION (PHARMACIST DOSING): Status: DC

## 2020-04-01 MED ORDER — VANCOMYCIN HCL 2000 MG/400ML IV SOLN
2000.0000 mg | Freq: Two times a day (BID) | INTRAVENOUS | Status: AC
  Administered 2020-04-01: 2000 mg via INTRAVENOUS
  Filled 2020-04-01 (×2): qty 400

## 2020-04-01 NOTE — Progress Notes (Signed)
Assisted tele visit to patient with daughter.  Wanda Ramirez Ann, RN  

## 2020-04-01 NOTE — Progress Notes (Signed)
Pharmacy Antibiotic Note  Wanda Ramirez is a 49 y.o. female admitted on 02/13/2020 with pneumonia.    Pt has been there for a while. She was treated previous with a course of abx for her MSSA PNA. Her wbc has remained elevated. She has had some intermittent fevers. The most recent resp culture is growing out staph aureus again. Vanc has been ordered until sens are back. Will give loading dose today and f/u with HD schedule  Height: 5\' 2"  (157.5 cm) Weight: 108.3 kg (238 lb 12.1 oz) IBW/kg (Calculated) : 50.1  Temp (24hrs), Avg:100.3 F (37.9 C), Min:99.6 F (37.6 C), Max:100.8 F (38.2 C)  Recent Labs  Lab 03/25/20 2343 03/26/20 0226 03/27/20 0623 03/28/20 0922 03/29/20 0245 03/30/20 0139 03/31/20 0119 04/01/20 0320  WBC 22.7*  --    < > 17.4* 17.8* 20.9* 23.6* 20.1*  CREATININE  --  5.92*   < > 4.92* 6.26* 3.98* 5.40* 3.05*  LATICACIDVEN 1.6 1.6  --   --   --   --   --   --    < > = values in this interval not displayed.    Estimated Creatinine Clearance: 25.9 mL/min (A) (by C-G formula based on SCr of 3.05 mg/dL (H)).    Allergies  Allergen Reactions  . Hydrocodone Itching and Nausea And Vomiting   Plan: Vanc 2g IV x1 F/u with HD schedule before establishing dose  Onnie Boer, PharmD, BCIDP, AAHIVP, CPP Infectious Disease Pharmacist 04/01/2020 12:05 PM

## 2020-04-01 NOTE — Progress Notes (Signed)
NAME:  Wanda Ramirez, MRN:  [id number], DOB:  1970-06-07, LOS: 63 ADMISSION DATE:  02/13/2020, CONSULTATION DATE:  9/24 REFERRING MD:  Heber Nisswa, CHIEF COMPLAINT:  Dyspnea   Brief History   49 yo female admitted with COVID 19 pneumonia on 9/13, had been diagnosed with COVID on 9/9.  On 9/24 her condition worsened and PCCM was consulted, she was sent to the ICU and treated with BIPAP, then eventually intubated.  Started on CRRT through a tunneled HD cath. Extubated on 10/6 but re-intubated on 10/8.  Has been challenged by sedation needs since admission.   Past Medical History  Sleep apnea Morbid obesity Hypothyroidism Hypertension GERD  End-stage renal disease on HD MWF Diabetes Asthma  Significant Hospital Events   9/14 admit 9/25 on BiPAP with Precedex overnight 9/26 -unable to run CRRT with prone ventilation.  Resume supine ventilation but still unable to draw blood back. 9/27 transferred to 62m. IR replaced HD catheter 10/1 on CRRT and vasopressors 10/6 - extubated 10/7 - had some stridor and increased WOB so placed on BIPAP and dexamethasone given.  10/15 weaning sedation. midaz off 10/16 move to 25M 10/18 s/p 6 proximal XLT Shiley tracheostomy  10/19: Resumed Eliquis low dose. Per Dr. Thompson Caul note 10/7 will need to be on [facility] for 1 month then reasses 10/19: Episode of hypoglycemia 55. 10/21 hypoglycemic again. levemir and tubefeed coverage decreased. Low grade temp->sputum culture send had vomiting event on 10/19 w/ concern for aspiration. Fent was stopped 10/20. Still gets anxious at night. Required increased precedex. Added clonazepam. 10/22 attempting PSV trials  10/25 precedex drip weaned off 10/27 precedex restarted 10/28 Klonopin and seroquel added to attempt to get back off precedex gtt  Consults:  Nephrology  Procedures:  9/23 tunneled HD cath placement >  10/8 ETT > 10/18 10/18 6 proximal XLT Shiley tracheostomy  >  Significant Diagnostic Tests:  9/21 Vas  ultrasound> thrombosed LUE fistula 9/21 VQ scan > negative for PE 9/29 echo> LVEF 60-65%, flat ventrcile consistent with RV pressure overload, RV severely enlarged, moderate reduction in RV function  Micro Data:  9/13 blood > neg   9/14 sars cov 2 > pos 9/26 blood > neg 10/8 resp > MSSA 10/8 blood > negative  10/21 sputum > Normal flora  10/25 Blood > ngtd x 3 days 10/25 Sputum  > few diphtheroids  10/25 Blood >> 10/29 Sputum >> Staph aureus  Antimicrobials:  9/14 remdesivir > 9/19 9/15 tocilizumab > x1 9/20 cefepime > 9/29 9/20 azithro >  9/25 10/8 vanc > 10/9 10/8 zosyn > x1 10/10 ceftriaxone > 10/16 10/31 vancomycin >>   Interim history/subjective:  Lasted about 2 hours on TC this AM.  Objective   Blood pressure 105/64, pulse (!) 129, temperature 100.1 F (37.8 C), temperature source Core, resp. rate (!) 30, height 5\' 2"  (1.575 m), weight 108.3 kg, SpO2 98 %.    Vent Mode: PSV;CPAP FiO2 (%):  [40 %] 40 % PEEP:  [5 cmH20] 5 cmH20 Pressure Support:  [10 cmH20] 10 cmH20   Intake/Output Summary (Last 24 hours) at 04/01/2020 1026 Last data filed at 04/01/2020 1000 Gross per 24 hour  Intake 1772 ml  Output 300 ml  Net 1472 ml   Filed Weights   03/30/20 0434 03/31/20 0708 04/01/20 0500  Weight: 103.6 kg 104.9 kg 108.3 kg   Examination:  General - alert Eyes - pupils reactive ENT - trach site clean Cardiac - regular, tachycardic Chest - scattered rhonchi Abdomen -  soft, non tender, + bowel sounds Extremities - 1+ edema Skin - no rashes Neuro - 0   Resolved Hospital Problem list   Thrombocytopenia  MSSA PNA  Nausea   Assessment & Plan:   Acute hypoxic respiratory failure from COVID 19 pneumonia with ARDS complicated by MSSA PNA. Failure to wean s/p tracheostomy. - pressure support to TC as able - f/u CXR intermittently - trach care - no insurance benefits for LTACH  ESRD. - iHD per nephrology  Hypotension 03/31/20. - likely from meds and  volume removal with HD - goal SBP > 90  Fever on 03/29/20. - Staph aureus in sputum - will add vancomycin pending culture results  Acute metabolic encephalopathy in setting of sepsis. Agitated delirium. - RASS goal 0 to -1 - continue klonopin, catapres, oxycodone, seroquel  Anemia of critical and chronic illness. - fu/ CBC - transfuse for Hb < 7 or significant bleeding  Hypothyroidism. - continue synthroid  DM type 2 with labile blood sugars. - SSI with lantus and tube feed coverage  Stage 2 sacral pressure ulcer. - wound care  Dysphagia. - IR arranging for PEG  Best practice:  Diet: tube feeding DVT prophylaxis: Heparin [place] GI prophylaxis: Protonix Mobility: bed rest Code Status: full Disposition: ICU  Labs:   CMP Latest Ref Rng & Units 04/01/2020 03/31/2020 03/30/2020  Glucose 70 - 99 mg/dL 255(H) 156(H) 182(H)  BUN 6 - 20 mg/dL 42(H) 75(H) 44(H)  Creatinine 0.44 - 1.00 mg/dL 3.05(H) 5.40(H) 3.98(H)  Sodium 135 - 145 mmol/L 133(L) 132(L) 134(L)  Potassium 3.5 - 5.1 mmol/L 4.3 5.2(H) 4.9  Chloride 98 - 111 mmol/L 93(L) 92(L) 94(L)  CO2 22 - 32 mmol/L 27 24 25   Calcium 8.9 - 10.3 mg/dL 10.2 10.6(H) 9.7  Total Protein 6.5 - 8.1 g/dL - - -  Total Bilirubin 0.3 - 1.2 mg/dL - - -  Alkaline Phos 38 - 126 U/L - - -  AST 15 - 41 U/L - - -  ALT 0 - 44 U/L - - -    CBC Latest Ref Rng & Units 04/01/2020 03/31/2020 03/30/2020  WBC 4.0 - 10.5 K/uL 20.1(H) 23.6(H) 20.9(H)  Hemoglobin 12.0 - 15.0 g/dL 7.6(L) 7.8(L) 7.7(L)  Hematocrit 36 - 46 % 24.2(L) 25.1(L) 25.7(L)  Platelets 150 - 400 K/uL 418(H) 419(H) 468(H)    ABG    Component Value Date/Time   PHART 7.327 (L) 03/13/2020 1135   PCO2ART 47.5 03/13/2020 1135   PO2ART 122 (H) 03/13/2020 1135   HCO3 24.7 03/13/2020 1135   TCO2 26 03/13/2020 1135   ACIDBASEDEF 1.0 03/13/2020 1135   O2SAT 98.0 03/13/2020 1135    CBG (last 3)  Recent Labs    04/01/20 0004 04/01/20 0446 04/01/20 0827  GLUCAP 226* 217*  182*    Critical care time: 32 minutes  Chesley Mires, MD [place] Pager - [phone] 04/01/2020, 10:26 AM

## 2020-04-01 NOTE — Progress Notes (Addendum)
E-link notified of patients sustained increased HR up to the 130s, no new orders at this time, will continue to monitor.

## 2020-04-01 NOTE — Progress Notes (Signed)
Assisted tele visit to patient with daughter.  Sigmund Hazel, RN

## 2020-04-01 NOTE — Progress Notes (Signed)
[facility] Progress Note   OP HD:TTS 4h 19mn 450/800 2/2.25 bath 111.5kg Hep 2000 L AVF - darbe 50 ug q week, last 9/7 - calc 1.0 tiw - 9/9 Hb 10.0, tsat 22%  49yo female w/ ESRD on TTS HD admitted for COVID PNA on 02/13/20. She was intubated. She had CRRT around 9/26- 10/7. Now is on intermittent HD. She is sp trach. She had MSSA pna. DM2.  Assessment/ Plan:   1. COVID pna /sp trach -trying to wean from vent 2. MSSA HCAP/ VAP - finished course of abx.  3. ESRD -usual HD TTS.sp CRRT 9/26- 10/7.  HD 10/28 with UF 3L net, only able to get 3050mnet on 10/30.  Next HD tomorrow, will try UF profiling, albumin 25gm + Midodrine. Hold Clonidine tomorrow.  4. BP/ volume -euvolemic on exam. 5. HD access: her LUA AVF clotted while here, will need new perm access when more stable. TDC x2 here by IR.LIJ TC 6. 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 Phos 3.9 10/30 8. DM2 - per pmd   Subjective:   Tachycardic even before trach collar. TM100.8 / 24hr   Objective:   BP 116/70   Pulse (!) 138   Temp 100.1 F (37.8 C) (Core)   Resp (!) 30   Ht _0  (1.575 m)   Wt 108.3 kg   SpO2 100%   BMI 43.67 kg/m   Intake/Output Summary (Last 24 hours) at 04/01/2020 096767ast data filed at 04/01/2020 0600 Gross per 24 hour  Intake 1377 ml  Output 300 ml  Net 1077 ml   Weight change:   Physical Exam: More alert, NG in , +trach collar CVS-tachy ABD- BS present soft non-distended  EXT- no edema LE's, lt arm access thrombosed ACCESS: [facility] TC  Imaging: DG Chest Port 1 View  Result Date: 04/01/2020 CLINICAL DATA:  Respiratory failure.  Follow-up exam. EXAM: PORTABLE CHEST 1 VIEW COMPARISON:  03/29/2020 and earlier studies. FINDINGS: Bilateral airspace lung opacities are without change from the most recent prior study. No new lung abnormalities. No pneumothorax.  Tracheostomy tube, nasogastric tube and dual lumen, tunneled left internal jugular central venous catheter are all stable. IMPRESSION: 1. No change from the most recent prior exam. 2. Persistent bilateral airspace lung opacities. No change in the support apparatus. Electronically Signed   By: DaLajean Manes.D.   On: 04/01/2020 07:06    Labs: BMET Recent Labs  Lab 03/26/20 0226 03/27/20 0620940/27/21 0922 03/29/20 0245 03/30/20 0139 03/31/20 0119 04/01/20 0320  NA 135 135 134* 134* 134* 132* 133*  K 4.4 4.0 3.4* 3.5 4.9 5.2* 4.3  CL 97* 95* 96* 95* 94* 92* 93*  CO2 _1 GLUCOSE 184* 168* 223* 94 182* 156* 255*  BUN 52* 72* 43* 58* 44* 75* 42*  CREATININE 5.92* 7.72* 4.92* 6.26* 3.98* 5.40* 3.05*  CALCIUM 9.8 10.3 10.0 10.4* 9.7 10.6* 10.2  PHOS  --  6.2*  --  5.2* 3.2 3.9  --    CBC Recent Labs  Lab 03/25/20 2343 03/27/20 0623 03/29/20 0245 03/30/20 0139 03/31/20 0119 04/01/20 0320  WBC 22.7*   < > 17.8* 20.9* 23.6* 20.1*  NEUTROABS 19.0*  --   --   --   --   --   HGB 7.5*   < > 7.0* 7.7* 7.8* 7.6*  HCT  23.9*   < > 21.9* 25.7* 25.1* 24.2*  MCV 95.2   < > 93.6 96.3 91.3 93.1  PLT 499*   < > 452* 468* 419* 418*   < > = values in this interval not displayed.    Medications:    . B-complex with vitamin C  1 tablet Per Tube Daily  . chlorhexidine gluconate (MEDLINE KIT)  15 mL Mouth Rinse BID  . Chlorhexidine Gluconate Cloth  6 each Topical Q0600  . clonazepam  0.5 mg Per Tube TID  . cloNIDine  0.1 mg Oral Q6H   Followed by  . cloNIDine  0.1 mg Oral BID   Followed by  . [START ON 04/02/2020] cloNIDine  0.1 mg Oral Daily  . collagenase   Topical Daily  . darbepoetin (ARANESP) injection - DIALYSIS  150 mcg Intravenous Q Tue-HD  . feeding supplement (PROSource TF)  45 mL Per Tube BID  . fiber  1 packet Per Tube BID  . heparin injection (subcutaneous)  5,000 Units Subcutaneous Q8H  . hydrocerin   Topical Daily  . insulin aspart  0-20 Units  Subcutaneous Q4H  . insulin aspart  5 Units Subcutaneous Q4H  . insulin detemir  12 Units Subcutaneous BID  . levothyroxine  125 mcg Per Tube Q0600  . mouth rinse  15 mL Mouth Rinse 10 times per day  . oxyCODONE  10 mg Per Tube Q6H  . pantoprazole sodium  40 mg Per Tube BID  . QUEtiapine  100 mg Per Tube BID  . sodium chloride flush  10-40 mL Intracatheter Q12H  . sodium chloride flush  3 mL Intravenous Q12H      Otelia Santee, MD 04/01/2020, 9:23 AM

## 2020-04-02 ENCOUNTER — Inpatient Hospital Stay (HOSPITAL_COMMUNITY): Payer: Medicaid Other

## 2020-04-02 DIAGNOSIS — J80 Acute respiratory distress syndrome: Secondary | ICD-10-CM | POA: Diagnosis not present

## 2020-04-02 DIAGNOSIS — R5081 Fever presenting with conditions classified elsewhere: Secondary | ICD-10-CM

## 2020-04-02 DIAGNOSIS — U071 COVID-19: Secondary | ICD-10-CM | POA: Diagnosis not present

## 2020-04-02 DIAGNOSIS — R509 Fever, unspecified: Secondary | ICD-10-CM

## 2020-04-02 DIAGNOSIS — N186 End stage renal disease: Secondary | ICD-10-CM | POA: Diagnosis not present

## 2020-04-02 LAB — CBC
HCT: 21 % — ABNORMAL LOW (ref 36.0–46.0)
Hemoglobin: 6.6 g/dL — CL (ref 12.0–15.0)
MCH: 29.3 pg (ref 26.0–34.0)
MCHC: 31.4 g/dL (ref 30.0–36.0)
MCV: 93.3 fL (ref 80.0–100.0)
Platelets: 359 10*3/uL (ref 150–400)
RBC: 2.25 MIL/uL — ABNORMAL LOW (ref 3.87–5.11)
RDW: 19 % — ABNORMAL HIGH (ref 11.5–15.5)
WBC: 26.6 10*3/uL — ABNORMAL HIGH (ref 4.0–10.5)
nRBC: 0.3 % — ABNORMAL HIGH (ref 0.0–0.2)

## 2020-04-02 LAB — BASIC METABOLIC PANEL
Anion gap: 15 (ref 5–15)
BUN: 80 mg/dL — ABNORMAL HIGH (ref 6–20)
CO2: 23 mmol/L (ref 22–32)
Calcium: 10.5 mg/dL — ABNORMAL HIGH (ref 8.9–10.3)
Chloride: 92 mmol/L — ABNORMAL LOW (ref 98–111)
Creatinine, Ser: 4.62 mg/dL — ABNORMAL HIGH (ref 0.44–1.00)
GFR, Estimated: 11 mL/min — ABNORMAL LOW (ref >60)
Glucose, Bld: 241 mg/dL — ABNORMAL HIGH (ref 70–99)
Potassium: 5.1 mmol/L (ref 3.5–5.1)
Sodium: 130 mmol/L — ABNORMAL LOW (ref 135–145)

## 2020-04-02 LAB — GLUCOSE, CAPILLARY
Glucose-Capillary: 138 mg/dL — ABNORMAL HIGH (ref 70–99)
Glucose-Capillary: 144 mg/dL — ABNORMAL HIGH (ref 70–99)
Glucose-Capillary: 146 mg/dL — ABNORMAL HIGH (ref 70–99)
Glucose-Capillary: 211 mg/dL — ABNORMAL HIGH (ref 70–99)
Glucose-Capillary: 245 mg/dL — ABNORMAL HIGH (ref 70–99)
Glucose-Capillary: 255 mg/dL — ABNORMAL HIGH (ref 70–99)
Glucose-Capillary: 271 mg/dL — ABNORMAL HIGH (ref 70–99)
Glucose-Capillary: 28 mg/dL — CL (ref 70–99)

## 2020-04-02 LAB — PROTIME-INR
INR: 1.2 (ref 0.8–1.2)
Prothrombin Time: 14.8 seconds (ref 11.4–15.2)

## 2020-04-02 LAB — HEMOGLOBIN AND HEMATOCRIT, BLOOD
HCT: 21.9 % — ABNORMAL LOW (ref 36.0–46.0)
Hemoglobin: 7 g/dL — ABNORMAL LOW (ref 12.0–15.0)

## 2020-04-02 LAB — CULTURE, RESPIRATORY W GRAM STAIN

## 2020-04-02 LAB — PREPARE RBC (CROSSMATCH)

## 2020-04-02 MED ORDER — IOHEXOL 300 MG/ML  SOLN
80.0000 mL | Freq: Once | INTRAMUSCULAR | Status: AC | PRN
  Administered 2020-04-02: 80 mL via INTRAVENOUS

## 2020-04-02 MED ORDER — SODIUM CHLORIDE 0.9% IV SOLUTION: Freq: Once | INTRAVENOUS | Status: AC

## 2020-04-02 MED ORDER — INSULIN DETEMIR 100 UNIT/ML ~~LOC~~ SOLN
16.0000 [IU] | Freq: Two times a day (BID) | SUBCUTANEOUS | Status: DC
  Administered 2020-04-02 – 2020-04-03 (×3): 16 [IU] via SUBCUTANEOUS
  Filled 2020-04-02 (×6): qty 0.16

## 2020-04-02 MED ORDER — EPINEPHRINE 1 MG/10ML IJ SOSY
PREFILLED_SYRINGE | INTRAMUSCULAR | Status: AC
  Filled 2020-04-02: qty 10

## 2020-04-02 MED ORDER — MIDODRINE HCL 5 MG PO TABS
10.0000 mg | ORAL_TABLET | Freq: Three times a day (TID) | ORAL | Status: DC
  Administered 2020-04-02 – 2020-04-05 (×7): 10 mg via ORAL
  Filled 2020-04-02 (×8): qty 2

## 2020-04-02 MED ORDER — MIDODRINE HCL 5 MG PO TABS
10.0000 mg | ORAL_TABLET | Freq: Once | ORAL | Status: AC
  Administered 2020-04-02: 10 mg via ORAL
  Filled 2020-04-02: qty 2

## 2020-04-02 MED ORDER — MIDODRINE HCL 5 MG PO TABS
10.0000 mg | ORAL_TABLET | Freq: Once | ORAL | Status: AC
  Administered 2020-04-02: 10 mg via ORAL

## 2020-04-02 MED ORDER — VANCOMYCIN HCL IN DEXTROSE 1-5 GM/200ML-% IV SOLN
1000.0000 mg | Freq: Once | INTRAVENOUS | Status: AC
  Administered 2020-04-03: 1000 mg via INTRAVENOUS
  Filled 2020-04-02: qty 200

## 2020-04-02 MED ORDER — SODIUM CHLORIDE 0.9% IV SOLUTION: Freq: Once | INTRAVENOUS | Status: DC

## 2020-04-02 MED ORDER — VANCOMYCIN VARIABLE DOSE PER UNSTABLE RENAL FUNCTION (PHARMACIST DOSING): Status: DC

## 2020-04-02 MED ORDER — CEFAZOLIN SODIUM-DEXTROSE 1-4 GM/50ML-% IV SOLN
1.0000 g | INTRAVENOUS | Status: DC
  Administered 2020-04-02: 1 g via INTRAVENOUS
  Filled 2020-04-02: qty 50

## 2020-04-02 MED ORDER — PIPERACILLIN-TAZOBACTAM IN DEX 2-0.25 GM/50ML IV SOLN
2.2500 g | Freq: Three times a day (TID) | INTRAVENOUS | Status: DC
  Administered 2020-04-03 – 2020-04-09 (×19): 2.25 g via INTRAVENOUS
  Filled 2020-04-02 (×21): qty 50

## 2020-04-02 MED ORDER — LACTATED RINGERS IV BOLUS
1000.0000 mL | Freq: Once | INTRAVENOUS | Status: AC
  Administered 2020-04-03: 1000 mL via INTRAVENOUS

## 2020-04-02 NOTE — Progress Notes (Signed)
Called eLink and spoke w/Liz Therapist, sports... notified her that pt's daughter [name] has been calling wanting updates and wanting recent CT results  Also asked what is the plan for tonight and if they would want to start and pressors  Lab came but are unable to draw any labs at this time since pt is actively receiving PRBCs.... sts she will wait 2 hours after pt is done w/PRBCs.   Will wait for further instructions.

## 2020-04-02 NOTE — Progress Notes (Signed)
CT reviewed, patient examined  Tachypneic labored breathing Encephalopathic which is an itnermittent issue for her but this appears worse Extensive soft tissue and muscle gas collection c/w infected decubitus ulcer. Terrible operative candidate.   Plan: - Start vanc/zosyn - f/u H/H - Check ABG, bicarb PRN - Check lactate - Will call on call surgeon for eval - Dismal prognosis  [name] MD PCCM

## 2020-04-02 NOTE — Progress Notes (Signed)
Patient not tolerating ATC wean.  Patient placed back on vent support  PS/CPAP 10/5, tolerating well at this time.

## 2020-04-02 NOTE — Progress Notes (Signed)
  Speech Language Pathology Treatment: [name] Speaking valve  Patient Details Name: Wanda Ramirez MRN: [id number] DOB: [birth year] Today's Date: 04/02/2020 Time: 1051-1100 SLP Time Calculation (min) (ACUTE ONLY): 9 min  Assessment / Plan / Recommendation Clinical Impression  Ms Giacobbe has been able to tolerate trach collar with increased frequency however not progressing in regards to  diaphragmatic strength, volitional cough and use of upper airway for speech. She could open eyes but not maintain more than several seconds. She did not tolerate valve for more than a few respiratory cycles. At baseline she was coughing secretions via trach around her inflated cuff (reflexive cough strong). Cough continued but not worsened after slow cuff deflation. Use of valve could not clear secretions from trachea and RN called for deep suctioning. Session stopped at this time. ST will continue efforts.      HPI HPI: 49 yo admitted 9/13 with Covid PNA, intubated 9/24-10/6, reintubated 10/8, CRRT 9/26-10/7, trach and bronch 10/18. PMhx: ESRD TTS, obesity, HTN, GERD, hypothyroidism, DM, asthma      SLP Plan  Continue with current plan of care       Recommendations         Patient may use Passy-Muir Speech Valve: with SLP only PMSV Supervision: Full         Oral Care Recommendations: Oral care QID Follow up Recommendations: Skilled Nursing facility SLP Visit Diagnosis: Aphonia (R49.1) Plan: Continue with current plan of care                      [name] 04/02/2020, 11:09 AM   Orbie Pyo Colvin Caroli.Ed Risk analyst [phone] Office [phone]

## 2020-04-02 NOTE — Progress Notes (Addendum)
NAME:  Wanda Ramirez, MRN:  [id number], DOB:  15-Jan-1971, LOS: 80 ADMISSION DATE:  02/13/2020, CONSULTATION DATE:  9/24 REFERRING MD:  Heber Ellerbe, CHIEF COMPLAINT:  Dyspnea   Brief History   49 yo female admitted with COVID 19 pneumonia on 9/13, had been diagnosed with COVID on 9/9.  On 9/24 her condition worsened and PCCM was consulted, she was sent to the ICU and treated with BIPAP, then eventually intubated.  Started on CRRT through a tunneled HD cath. Extubated on 10/6 but re-intubated on 10/8.  Has been challenged by sedation needs since admission.   Past Medical History  Sleep apnea Morbid obesity Hypothyroidism Hypertension GERD  End-stage renal disease on HD MWF Diabetes Asthma  Significant Hospital Events   9/14 admit 9/25 on BiPAP with Precedex overnight 9/26 -unable to run CRRT with prone ventilation.  Resume supine ventilation but still unable to draw blood back. 9/27 transferred to 5m. IR replaced HD catheter 10/1 on CRRT and vasopressors 10/6 - extubated 10/7 - had some stridor and increased WOB so placed on BIPAP and dexamethasone given.  10/15 weaning sedation. midaz off 10/16 move to 88M 10/18 s/p 6 proximal XLT [place] tracheostomy  10/19: Resumed Eliquis low dose. Per Dr. Thompson Caul note 10/7 will need to be on [facility] for 1 month then reasses 10/19: Episode of hypoglycemia 55. 10/21 hypoglycemic again. levemir and tubefeed coverage decreased. Low grade temp->sputum culture send had vomiting event on 10/19 w/ concern for aspiration. Fent was stopped 10/20. Still gets anxious at night. Required increased precedex. Added clonazepam. 10/22 attempting PSV trials  10/25 precedex drip weaned off 10/27 precedex restarted 10/28 Klonopin and seroquel added to attempt to get back off precedex gtt 10/31 started on vanco due to staph sputum  Consults:  Nephrology  Procedures:  9/23 tunneled HD cath placement >  10/8 ETT > 10/18 10/18 6 proximal XLT [place] tracheostomy   >  Significant Diagnostic Tests:  9/21 Vas ultrasound> thrombosed LUE fistula 9/21 VQ scan > negative for PE 9/29 echo> LVEF 60-65%, flat ventrcile consistent with RV pressure overload, RV severely enlarged, moderate reduction in RV function  Micro Data:  9/13 blood > neg   9/14 sars cov 2 > pos 9/26 blood > neg 10/8 resp > MSSA 10/8 blood > negative  10/21 sputum > Normal flora  10/25 Blood > ngtd x 3 days 10/25 Sputum  > few diphtheroids  10/25 Blood >>negative 10/29 Sputum >> Staph aureus  Antimicrobials:  9/14 remdesivir > 9/19 9/15 tocilizumab > x1 9/20 cefepime > 9/29 9/20 azithro >  9/25 10/8 vanc > 10/9 10/8 zosyn > x1 10/10 ceftriaxone > 10/16 10/31 vancomycin >>   Interim history/subjective:  Initially quite tachypneic on ATC, but has since calmed down a bit.   Objective   Blood pressure (!) 96/44, pulse (!) 109, temperature 100.3 F (37.9 C), temperature source Core, resp. rate (!) 26, height 5\' 2"  (1.575 m), weight 107.5 kg, SpO2 100 %.    Vent Mode: PSV;CPAP FiO2 (%):  [40 %] 40 % PEEP:  [5 cmH20] 5 cmH20 Pressure Support:  [10 cmH20] 10 cmH20   Intake/Output Summary (Last 24 hours) at 04/02/2020 0836 Last data filed at 04/02/2020 4287 Gross per 24 hour  Intake 1890 ml  Output 50 ml  Net 1840 ml   Filed Weights   03/31/20 0708 04/01/20 0500 04/02/20 0500  Weight: 104.9 kg 108.3 kg 107.5 kg   Examination: General:  Morbidly obese middle aged female  Neuro:  Eyes open to voice. No response or interaction on exam.  HEENT:  [place]/AT, No JVD noted, PERRL  Cardiovascular:  RRR, no MRG Lungs:  Clear bilateral breath sounds. Significant purulent sputum production. Clearing well via trach.  Abdomen:  Soft, non-distended, non-tender.  Musculoskeletal:  No acute deformity Skin:  Intact, MMM. Sacral wound not assessed.    Resolved Hospital Problem list   Thrombocytopenia  MSSA PNA  Nausea   Assessment & Plan:   Acute hypoxic respiratory failure from  COVID 19 pneumonia with ARDS complicated by MSSA PNA. Failure to wean s/p tracheostomy. - Trach collat trial as tolerated.  - Rest on full support overnight.  - routine trach care - no insurance benefits for LTACH  ESRD - iHD per nephrology  Hypotension: intermittent: likely from meds and volume removal with HD - goal SBP > 90 - Consider routine midodrine if this becomes an issue with HD.   Staph tracheobronchitis vs pneumonia.  - Continue vancomycin - Suspect this will prove to be MSSA as previously in the admission. Will await susceptibilities prior to narrowing.   Acute metabolic encephalopathy in setting of sepsis. Agitated delirium. - RASS goal 0 - continue klonopin, oxycodone, seroquel - Will DC catapres considering hypotension and oversedation. Today is supposed to be last dose anyway.   Anemia of critical and chronic illness. - fu/ CBC - transfuse for Hb < 7 or significant bleeding - one unit PRBC today pending type and screen.   Hypothyroidism. - continue synthroid  DM type 2 with labile blood sugars. - SSI with lantus and tube feed coverage - Increase lantus 12 BID to 16 BID  Stage 2 sacral pressure ulcer: assessed with [place] RN 11/1 and noted to have feculent material entering wound raising concern for fistula with bowel.  - CT abdomen pelvis with contrast to assess for fistula.  - wound care - Surgical consult once CT done.   Dysphagia. - IR arranging for PEG this week.   Best practice:  Diet: tube feeding DVT prophylaxis: Heparin [place] GI prophylaxis: Protonix Mobility: bed rest Code Status: full Disposition: ICU  Labs:   CMP Latest Ref Rng & Units 04/02/2020 04/01/2020 03/31/2020  Glucose 70 - 99 mg/dL 241(H) 255(H) 156(H)  BUN 6 - 20 mg/dL 80(H) 42(H) 75(H)  Creatinine 0.44 - 1.00 mg/dL 4.62(H) 3.05(H) 5.40(H)  Sodium 135 - 145 mmol/L 130(L) 133(L) 132(L)  Potassium 3.5 - 5.1 mmol/L 5.1 4.3 5.2(H)  Chloride 98 - 111 mmol/L 92(L) 93(L) 92(L)  CO2  22 - 32 mmol/L 23 27 24   Calcium 8.9 - 10.3 mg/dL 10.5(H) 10.2 10.6(H)  Total Protein 6.5 - 8.1 g/dL - - -  Total Bilirubin 0.3 - 1.2 mg/dL - - -  Alkaline Phos 38 - 126 U/L - - -  AST 15 - 41 U/L - - -  ALT 0 - 44 U/L - - -    CBC Latest Ref Rng & Units 04/02/2020 04/01/2020 03/31/2020  WBC 4.0 - 10.5 K/uL 26.6(H) 20.1(H) 23.6(H)  Hemoglobin 12.0 - 15.0 g/dL 6.6(LL) 7.6(L) 7.8(L)  Hematocrit 36 - 46 % 21.0(L) 24.2(L) 25.1(L)  Platelets 150 - 400 K/uL 359 418(H) 419(H)    ABG    Component Value Date/Time   PHART 7.327 (L) 03/13/2020 1135   PCO2ART 47.5 03/13/2020 1135   PO2ART 122 (H) 03/13/2020 1135   HCO3 24.7 03/13/2020 1135   TCO2 26 03/13/2020 1135   ACIDBASEDEF 1.0 03/13/2020 1135   O2SAT 98.0 03/13/2020 1135    CBG (  last 3)  Recent Labs    04/02/20 0023 04/02/20 0430 04/02/20 0825  GLUCAP 245* 211* 271*    Critical care time: 36 minutes    [name], AGACNP-BC [place] for personal pager PCCM on call pager [phone]  04/02/2020 8:53 AM

## 2020-04-02 NOTE — Progress Notes (Signed)
PCCM Interval Progress Note  Signed out to follow up on H/H after pt had bleeding from large sacral wound earlier this afternoon.  Since then, she has had ongoing trickling of blood.  Hgb returned at 7.  Will order 2u PRBC now.  After 1st unit, pt to go for CT abd / pelvis to better evaluate and get an idea of source.  I called pt's daughter [name] and explained that there is likely little we can offer regardless of where the bleeding is coming from.  Strongly recommended that they consider DNR in the event of decline or arrest.  [name] is calling her other sister to discuss further.   Montey Hora, [facility] 04/02/2020, 7:48 PM

## 2020-04-02 NOTE — Progress Notes (Signed)
Pharmacy Antibiotic Note  Wanda Ramirez is a 49 y.o. female admitted on 02/13/2020 with necrotizing fascitis?.  Pharmacy has been consulted for vancomycin and zosyn dosing. Currently on cefazolin.  She received 2gm vanc 10/31 @14 :29.  She had 4 hr HD earlier today  Plan: D/c cefazolin Give vancomycin 1gm IV x 1 now.  Will dose further based on HD schedule Zosyn 2.25 gm IV q8 hours F/u cultures and clinical course  Height: 5\' 2"  (157.5 cm) Weight: 107.5 kg (236 lb 15.9 oz) IBW/kg (Calculated) : 50.1  Temp (24hrs), Avg:100.1 F (37.8 C), Min:94.9 F (34.9 C), Max:103 F (39.4 C)  Recent Labs  Lab 03/29/20 0245 03/30/20 0139 03/31/20 0119 04/01/20 0320 04/02/20 0355  WBC 17.8* 20.9* 23.6* 20.1* 26.6*  CREATININE 6.26* 3.98* 5.40* 3.05* 4.62*    Estimated Creatinine Clearance: 17 mL/min (A) (by C-G formula based on SCr of 4.62 mg/dL (H)).    Allergies  Allergen Reactions  . Hydrocodone Itching and Nausea And Vomiting    Thank you for allowing pharmacy to be a part of this patient's care.  Excell Seltzer Poteet 04/02/2020 10:54 PM

## 2020-04-02 NOTE — Progress Notes (Signed)
[facility] Progress Note Patient Name: Wanda Ramirez DOB: 1970-10-11 MRN: [id number]   Date of Service  04/02/2020  HPI/Events of Note  Hb 6.6. Down from 7.4.  eICU Interventions  T&S. Transfuse 1u pRBCs. Obtain post-transfusion H/H.  Will HOLD [place] heparin temporarily.     Intervention Category Intermediate Interventions: Bleeding - evaluation and treatment with blood products  Marily Lente Anistyn Graddy 04/02/2020, 4:41 AM

## 2020-04-02 NOTE — Consult Note (Addendum)
[facility] Nurse wound follow up Wound type: Refer to previous [facility] consult notes on 10/25; pt developed an Unstageable pressure injury to the sacrum.  Area has declined further upon this assessment. Measurement: 4X3X3cm; 100% eschar, strong foul odor, large amt brown liquid draining out of wound when swab was inserted in a "pulsating manner when the patient coughs, "appearance is similar in appearance to the liquid brown stool which  Is draining in the flexiseal which has been inserted to attempt to contain stool. This may indicate a possible fistula.  Dressing procedure/placement/frequency: Bedside nurses have been applying Santyl to provide enzymatic debridement of nonviable tissue. Critical care team at the bedside to assess wound and drainage appearance; CT scan will be ordered and pt will need a surgical consult for this complex medical condition and for further recommendations regardingpossible wound debridement.  [place] team will continue to assess the location weekly and adjust the plan of care if indicated at that time.  Julien Girt MSN, RN, [place], [place], [place]

## 2020-04-02 NOTE — Progress Notes (Signed)
OT Cancellation Note  Patient Details Name: Wanda Ramirez MRN: [id number] DOB: 11/11/1970   Cancelled Treatment:    Reason Eval/Treat Not Completed: Medical issues which prohibited therapy-pt with Hgb 6.6 with plan to receive blood with HD later today.  RN requesting hold due to low hgb and not following commands.  Will see as able and appropriate.   Jolaine Artist, OT Acute Rehabilitation Services Pager [phone] Office [phone]   Delight Stare 04/02/2020, 10:44 AM

## 2020-04-02 NOTE — Progress Notes (Signed)
Patient completed HD and ready for CT scan , While cleaning patient prior to transport , patient wound started pouring blood. Paged MD at [phone].  To see if  patient was stable enough to transport to CT.  PA Hoffman return call and  Plan is to draw labs, transport to CT and as long as in HR 130's, and MAP greater than 65.

## 2020-04-02 NOTE — Progress Notes (Signed)
CRITICAL VALUE ALERT  Critical Value:  Hemoglobin 6.6  Date & Time Notied:  04/02/20 4:31 AM  Provider Notified: E-link  Orders Received/Actions taken: Awaiting further instruction

## 2020-04-02 NOTE — Progress Notes (Signed)
[facility] Progress Note Patient Name: Wanda Ramirez DOB: 1971-05-23 MRN: [id number]   Date of Service  04/02/2020  HPI/Events of Note  Borderline MAPs ~ 56 mmHg. Due for midodrine this AM with HD. Awaiting blood ordered earlier.   eICU Interventions  OK to give midodrine dose early. Transfuse blood with HD.     Intervention Category Intermediate Interventions: Hypotension - evaluation and management  Marily Lente Amitai Delaughter 04/02/2020, 5:42 AM

## 2020-04-02 NOTE — Progress Notes (Signed)
PT Cancellation Note  Patient Details Name: Wanda Ramirez MRN: [id number] DOB: 1970/11/19   Cancelled Treatment:    Reason Eval/Treat Not Completed: Patient not medically ready (pt with Hgb 6.6 and will receive blood with HD later today. Per RN hold secondary to pt not following commands and low Hgb)   [name] B Tiegan Jambor 04/02/2020, 10:39 AM  Bayard Males, PT Acute Rehabilitation Services Pager: [phone] Office: [phone]

## 2020-04-02 NOTE — Progress Notes (Signed)
Patient transported to CT and back without complications.  

## 2020-04-02 NOTE — Progress Notes (Addendum)
[facility] Progress Note   OP HD:TTS 4h 47mn 450/800 2/2.25 bath 111.5kg Hep 2000 L AVF - darbe 50 ug q week, last 9/7 - calc 1.0 tiw - 9/9 Hb 10.0, tsat 22%  49yo female w/ ESRD on TTS HD admitted for COVID PNA on 02/13/20. She was intubated. SP CRRT around 9/26- 10/7. Now is on intermittent HD. She is sp trach. She had MSSA pna. DM2.  Assessment/ Plan:   1. COVID pna /sp trach -trying to wean from vent 2. MSSA HCAP/ VAP - finished course of abx.  3. ESRD -usual HD TTS.sp CRRT 9/26- 10/7. - HD today off schedule in ICU, ordered midodrine 10 tid - softer BP's lean body wt+ vs new sepsis picture (^wbc/ temp)  4. BP/ volume -euvolemic on exam, minimal edema, 4-7kg under 5. HD access: her LUA AVF clotted while here, will need new perm access when more stable. TDC x2 here by IR.LIJ TC 6. 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 Phos 3.9 10/30 8. DM2 - per pmd  RKelly Splinter MD 04/02/2020, 1:37 PM      Subjective:   Seen in ICU, asleep   Objective:   BP (!) 108/57   Pulse (!) 118   Temp (!) 94.9 F (34.9 C) (Core)   Resp (!) 24   Ht '5\' 2"'  (1.575 m)   Wt 107.5 kg   SpO2 93%   BMI 43.35 kg/m   Intake/Output Summary (Last 24 hours) at 04/02/2020 1335 Last data filed at 04/02/2020 1000 Gross per 24 hour  Intake 1605 ml  Output 50 ml  Net 1555 ml   Weight change: 2.6 kg  Physical Exam: More alert, NG in , +trach collar CVS-tachy ABD- BS present soft non-distended  EXT- no edema LE's, lt arm access thrombosed ACCESS: [facility] TC  Imaging: DG Chest Port 1 View  Result Date: 04/02/2020 CLINICAL DATA:  Healthcare associated pneumonia. EXAM: PORTABLE CHEST 1 VIEW COMPARISON:  04/01/2020 FINDINGS: The tracheostomy tube tip is above the carina. Feeding tube unchanged. Left IJ dialysis catheter is noted with tips in the right atrium. Bilateral interstitial  and airspace opacities are unchanged from previous exam. IMPRESSION: 1. No change in aeration to the lungs compared with previous exam. 2. Stable support apparatus. Electronically Signed   By: TKerby MoorsM.D.   On: 04/02/2020 07:26   DG Chest Port 1 View  Result Date: 04/01/2020 CLINICAL DATA:  Respiratory failure.  Follow-up exam. EXAM: PORTABLE CHEST 1 VIEW COMPARISON:  03/29/2020 and earlier studies. FINDINGS: Bilateral airspace lung opacities are without change from the most recent prior study. No new lung abnormalities. No pneumothorax. Tracheostomy tube, nasogastric tube and dual lumen, tunneled left internal jugular central venous catheter are all stable. IMPRESSION: 1. No change from the most recent prior exam. 2. Persistent bilateral airspace lung opacities. No change in the support apparatus. Electronically Signed   By: DLajean ManesM.D.   On: 04/01/2020 07:06    Labs: BMET Recent Labs  Lab 03/27/20 0623 03/28/20 0922 03/29/20 0245 03/30/20 0139 03/31/20 0119 04/01/20 0320 04/02/20 0355  NA 135 134* 134* 134* 132* 133* 130*  K 4.0 3.4* 3.5 4.9 5.2* 4.3 5.1  CL 95* 96* 95* 94* 92* 93* 92*  CO2 '25 23 24 25 24 27 23  ' GLUCOSE 168* 223* 94 182* 156* 255* 241*  BUN 72* 43* 58* 44* 75* 42*  80*  CREATININE 7.72* 4.92* 6.26* 3.98* 5.40* 3.05* 4.62*  CALCIUM 10.3 10.0 10.4* 9.7 10.6* 10.2 10.5*  PHOS 6.2*  --  5.2* 3.2 3.9  --   --    CBC Recent Labs  Lab 03/30/20 0139 03/31/20 0119 04/01/20 0320 04/02/20 0355  WBC 20.9* 23.6* 20.1* 26.6*  HGB 7.7* 7.8* 7.6* 6.6*  HCT 25.7* 25.1* 24.2* 21.0*  MCV 96.3 91.3 93.1 93.3  PLT 468* 419* 418* 359    Medications:    . sodium chloride   Intravenous Once  . B-complex with vitamin C  1 tablet Per Tube Daily  . chlorhexidine gluconate (MEDLINE KIT)  15 mL Mouth Rinse BID  . Chlorhexidine Gluconate Cloth  6 each Topical Q0600  . clonazepam  0.5 mg Per Tube TID  . collagenase   Topical Daily  . darbepoetin (ARANESP)  injection - DIALYSIS  150 mcg Intravenous Q Tue-HD  . feeding supplement (PROSource TF)  45 mL Per Tube BID  . fiber  1 packet Per Tube BID  . hydrocerin   Topical Daily  . insulin aspart  0-20 Units Subcutaneous Q4H  . insulin aspart  5 Units Subcutaneous Q4H  . insulin detemir  16 Units Subcutaneous BID  . levothyroxine  125 mcg Per Tube Q0600  . mouth rinse  15 mL Mouth Rinse 10 times per day  . midodrine  10 mg Oral TID WC  . midodrine  10 mg Oral Once  . oxyCODONE  10 mg Per Tube Q6H  . pantoprazole sodium  40 mg Per Tube BID  . QUEtiapine  100 mg Per Tube BID  . sodium chloride flush  10-40 mL Intracatheter Q12H  . sodium chloride flush  3 mL Intravenous Q12H

## 2020-04-02 NOTE — Progress Notes (Addendum)
eLink Physician-Brief Progress Note Patient Name: Wanda Ramirez DOB: [birth year] MRN: [id number]   Date of Service  04/02/2020  HPI/Events of Note  Called to assess this patient who was originally admitted for COVID infection (now s/p trach and on minimal ventilator settings) with course complicated by a bleeding sacral wound which has required blood transfusions, as well as more recent concern for infection (she is currently febrile to 101, and increasingly hypotensive/tachycardic). On review of micro data, she had MSSA groww in a trach aspirate from 10/29, though this had also grown in a trach aspirate from 10/8. She was just started on vanc/zosyn today for empiric coverage. She does continue to have a trickle of blood loss from her sacral wound though I doubt this blood loss is the cause of her hypotension.   eICU Interventions  - Draw blood cultures x2, trach aspirate culture. She is anuric so no UCx ordered.  - Agree with empiric Vanc/Zosyn for now.  - Give 1L fluid bolus now. Reassess BP after this bolus.  - Tylenol for fever.  - Check H/H after she completes the blood transfusion that is currently infusing.   ADDENDUM 04/03/20 2:26 AM - Patient did not respond to IVF bolus. Central line placed. - Order for levophed infusion entered.  Intervention Category Major Interventions: Hypotension - evaluation and management;Sepsis - evaluation and management  Marily Lente Tamula Morrical 04/02/2020, 11:47 PM

## 2020-04-03 ENCOUNTER — Inpatient Hospital Stay (HOSPITAL_COMMUNITY): Payer: Medicaid Other | Admitting: Anesthesiology

## 2020-04-03 ENCOUNTER — Encounter (HOSPITAL_COMMUNITY)
Admission: EM | Disposition: A | Discharge status: Rehabilitation Facility | Payer: Self-pay | Source: Ambulatory Visit | Attending: Internal Medicine

## 2020-04-03 ENCOUNTER — Inpatient Hospital Stay (HOSPITAL_COMMUNITY): Payer: Medicaid Other

## 2020-04-03 DIAGNOSIS — J80 Acute respiratory distress syndrome: Secondary | ICD-10-CM | POA: Diagnosis not present

## 2020-04-03 DIAGNOSIS — R509 Fever, unspecified: Secondary | ICD-10-CM

## 2020-04-03 DIAGNOSIS — A419 Sepsis, unspecified organism: Secondary | ICD-10-CM

## 2020-04-03 DIAGNOSIS — L03317 Cellulitis of buttock: Secondary | ICD-10-CM

## 2020-04-03 DIAGNOSIS — M7989 Other specified soft tissue disorders: Secondary | ICD-10-CM

## 2020-04-03 DIAGNOSIS — N186 End stage renal disease: Secondary | ICD-10-CM | POA: Diagnosis not present

## 2020-04-03 DIAGNOSIS — R6521 Severe sepsis with septic shock: Secondary | ICD-10-CM

## 2020-04-03 DIAGNOSIS — U071 COVID-19: Secondary | ICD-10-CM | POA: Diagnosis not present

## 2020-04-03 DIAGNOSIS — J9601 Acute respiratory failure with hypoxia: Secondary | ICD-10-CM | POA: Diagnosis not present

## 2020-04-03 HISTORY — PX: INCISION AND DRAINAGE PERIRECTAL ABSCESS: SHX1804

## 2020-04-03 LAB — POCT I-STAT 7, (LYTES, BLD GAS, ICA,H+H)
Acid-Base Excess: 1 mmol/L (ref 0.0–2.0)
Acid-Base Excess: 0 mmol/L (ref 0.0–2.0)
Acid-Base Excess: 0 mmol/L (ref 0.0–2.0)
Acid-Base Excess: 0 mmol/L (ref 0.0–2.0)
Acid-base deficit: 2 mmol/L (ref 0.0–2.0)
Bicarbonate: 29.7 mmol/L — ABNORMAL HIGH (ref 20.0–28.0)
Bicarbonate: 28.1 mmol/L — ABNORMAL HIGH (ref 20.0–28.0)
Bicarbonate: 28.2 mmol/L — ABNORMAL HIGH (ref 20.0–28.0)
Bicarbonate: 27.4 mmol/L (ref 20.0–28.0)
Bicarbonate: 28.4 mmol/L — ABNORMAL HIGH (ref 20.0–28.0)
Calcium, Ion: 1.27 mmol/L (ref 1.15–1.40)
Calcium, Ion: 1.12 mmol/L — ABNORMAL LOW (ref 1.15–1.40)
Calcium, Ion: 1.21 mmol/L (ref 1.15–1.40)
Calcium, Ion: 1.19 mmol/L (ref 1.15–1.40)
Calcium, Ion: 1.2 mmol/L (ref 1.15–1.40)
HCT: 24 % — ABNORMAL LOW (ref 36.0–46.0)
HCT: 24 % — ABNORMAL LOW (ref 36.0–46.0)
HCT: 28 % — ABNORMAL LOW (ref 36.0–46.0)
HCT: 27 % — ABNORMAL LOW (ref 36.0–46.0)
HCT: 27 % — ABNORMAL LOW (ref 36.0–46.0)
Hemoglobin: 8.2 g/dL — ABNORMAL LOW (ref 12.0–15.0)
Hemoglobin: 8.2 g/dL — ABNORMAL LOW (ref 12.0–15.0)
Hemoglobin: 9.5 g/dL — ABNORMAL LOW (ref 12.0–15.0)
Hemoglobin: 9.2 g/dL — ABNORMAL LOW (ref 12.0–15.0)
Hemoglobin: 9.2 g/dL — ABNORMAL LOW (ref 12.0–15.0)
O2 Saturation: 100 %
O2 Saturation: 100 %
O2 Saturation: 100 %
O2 Saturation: 98 %
O2 Saturation: 99 %
Patient temperature: 98.9
Patient temperature: 99
Potassium: 4.7 mmol/L (ref 3.5–5.1)
Potassium: 5.1 mmol/L (ref 3.5–5.1)
Potassium: 5.2 mmol/L — ABNORMAL HIGH (ref 3.5–5.1)
Potassium: 5 mmol/L (ref 3.5–5.1)
Potassium: 4.8 mmol/L (ref 3.5–5.1)
Sodium: 134 mmol/L — ABNORMAL LOW (ref 135–145)
Sodium: 133 mmol/L — ABNORMAL LOW (ref 135–145)
Sodium: 134 mmol/L — ABNORMAL LOW (ref 135–145)
Sodium: 133 mmol/L — ABNORMAL LOW (ref 135–145)
Sodium: 133 mmol/L — ABNORMAL LOW (ref 135–145)
TCO2: 32 mmol/L (ref 22–32)
TCO2: 30 mmol/L (ref 22–32)
TCO2: 30 mmol/L (ref 22–32)
TCO2: 30 mmol/L (ref 22–32)
TCO2: 30 mmol/L (ref 22–32)
pCO2 arterial: 73.1 mmHg (ref 32.0–48.0)
pCO2 arterial: 68.2 mmHg (ref 32.0–48.0)
pCO2 arterial: 71.3 mmHg (ref 32.0–48.0)
pCO2 arterial: 72.9 mmHg (ref 32.0–48.0)
pCO2 arterial: 64.8 mmHg — ABNORMAL HIGH (ref 32.0–48.0)
pH, Arterial: 7.217 — ABNORMAL LOW (ref 7.350–7.450)
pH, Arterial: 7.205 — ABNORMAL LOW (ref 7.350–7.450)
pH, Arterial: 7.223 — ABNORMAL LOW (ref 7.350–7.450)
pH, Arterial: 7.184 — CL (ref 7.350–7.450)
pH, Arterial: 7.25 — ABNORMAL LOW (ref 7.350–7.450)
pO2, Arterial: 380 mmHg — ABNORMAL HIGH (ref 83.0–108.0)
pO2, Arterial: 263 mmHg — ABNORMAL HIGH (ref 83.0–108.0)
pO2, Arterial: 282 mmHg — ABNORMAL HIGH (ref 83.0–108.0)
pO2, Arterial: 145 mmHg — ABNORMAL HIGH (ref 83.0–108.0)
pO2, Arterial: 156 mmHg — ABNORMAL HIGH (ref 83.0–108.0)

## 2020-04-03 LAB — CBC
HCT: 23 % — ABNORMAL LOW (ref 36.0–46.0)
HCT: 28.2 % — ABNORMAL LOW (ref 36.0–46.0)
HCT: 24.3 % — ABNORMAL LOW (ref 36.0–46.0)
Hemoglobin: 7.3 g/dL — ABNORMAL LOW (ref 12.0–15.0)
Hemoglobin: 9.1 g/dL — ABNORMAL LOW (ref 12.0–15.0)
Hemoglobin: 8 g/dL — ABNORMAL LOW (ref 12.0–15.0)
MCH: 28.7 pg (ref 26.0–34.0)
MCH: 29.1 pg (ref 26.0–34.0)
MCH: 28.5 pg (ref 26.0–34.0)
MCHC: 31.7 g/dL (ref 30.0–36.0)
MCHC: 32.3 g/dL (ref 30.0–36.0)
MCHC: 32.9 g/dL (ref 30.0–36.0)
MCV: 90.6 fL (ref 80.0–100.0)
MCV: 90.1 fL (ref 80.0–100.0)
MCV: 86.5 fL (ref 80.0–100.0)
Platelets: 294 10*3/uL (ref 150–400)
Platelets: 234 10*3/uL (ref 150–400)
Platelets: 222 10*3/uL (ref 150–400)
RBC: 2.54 MIL/uL — ABNORMAL LOW (ref 3.87–5.11)
RBC: 3.13 MIL/uL — ABNORMAL LOW (ref 3.87–5.11)
RBC: 2.81 MIL/uL — ABNORMAL LOW (ref 3.87–5.11)
RDW: 17.7 % — ABNORMAL HIGH (ref 11.5–15.5)
RDW: 17 % — ABNORMAL HIGH (ref 11.5–15.5)
RDW: 17.3 % — ABNORMAL HIGH (ref 11.5–15.5)
WBC: 19.6 10*3/uL — ABNORMAL HIGH (ref 4.0–10.5)
WBC: 20.4 10*3/uL — ABNORMAL HIGH (ref 4.0–10.5)
WBC: 23 10*3/uL — ABNORMAL HIGH (ref 4.0–10.5)
nRBC: 0.5 % — ABNORMAL HIGH (ref 0.0–0.2)
nRBC: 0.7 % — ABNORMAL HIGH (ref 0.0–0.2)
nRBC: 0.3 % — ABNORMAL HIGH (ref 0.0–0.2)

## 2020-04-03 LAB — BASIC METABOLIC PANEL
Anion gap: 14 (ref 5–15)
BUN: 40 mg/dL — ABNORMAL HIGH (ref 6–20)
CO2: 25 mmol/L (ref 22–32)
Calcium: 9.2 mg/dL (ref 8.9–10.3)
Chloride: 93 mmol/L — ABNORMAL LOW (ref 98–111)
Creatinine, Ser: 2.58 mg/dL — ABNORMAL HIGH (ref 0.44–1.00)
GFR, Estimated: 22 mL/min — ABNORMAL LOW (ref >60)
Glucose, Bld: 163 mg/dL — ABNORMAL HIGH (ref 70–99)
Potassium: 4.4 mmol/L (ref 3.5–5.1)
Sodium: 132 mmol/L — ABNORMAL LOW (ref 135–145)

## 2020-04-03 LAB — PROTIME-INR
INR: 1.4 — ABNORMAL HIGH (ref 0.8–1.2)
INR: 1.5 — ABNORMAL HIGH (ref 0.8–1.2)
Prothrombin Time: 16.9 seconds — ABNORMAL HIGH (ref 11.4–15.2)
Prothrombin Time: 17.2 seconds — ABNORMAL HIGH (ref 11.4–15.2)

## 2020-04-03 LAB — LACTIC ACID, PLASMA: Lactic Acid, Venous: 2.1 mmol/L (ref 0.5–1.9)

## 2020-04-03 LAB — COMPREHENSIVE METABOLIC PANEL
ALT: 18 U/L (ref 0–44)
AST: 60 U/L — ABNORMAL HIGH (ref 15–41)
Albumin: 1.7 g/dL — ABNORMAL LOW (ref 3.5–5.0)
Alkaline Phosphatase: 229 U/L — ABNORMAL HIGH (ref 38–126)
Anion gap: 12 (ref 5–15)
BUN: 47 mg/dL — ABNORMAL HIGH (ref 6–20)
CO2: 24 mmol/L (ref 22–32)
Calcium: 8.7 mg/dL — ABNORMAL LOW (ref 8.9–10.3)
Chloride: 97 mmol/L — ABNORMAL LOW (ref 98–111)
Creatinine, Ser: 2.87 mg/dL — ABNORMAL HIGH (ref 0.44–1.00)
GFR, Estimated: 19 mL/min — ABNORMAL LOW (ref >60)
Glucose, Bld: 130 mg/dL — ABNORMAL HIGH (ref 70–99)
Potassium: 4.8 mmol/L (ref 3.5–5.1)
Sodium: 133 mmol/L — ABNORMAL LOW (ref 135–145)
Total Bilirubin: 3.8 mg/dL — ABNORMAL HIGH (ref 0.3–1.2)
Total Protein: 5.5 g/dL — ABNORMAL LOW (ref 6.5–8.1)

## 2020-04-03 LAB — MAGNESIUM: Magnesium: 2.1 mg/dL (ref 1.7–2.4)

## 2020-04-03 LAB — GLUCOSE, CAPILLARY
Glucose-Capillary: 131 mg/dL — ABNORMAL HIGH (ref 70–99)
Glucose-Capillary: 124 mg/dL — ABNORMAL HIGH (ref 70–99)
Glucose-Capillary: 142 mg/dL — ABNORMAL HIGH (ref 70–99)
Glucose-Capillary: 174 mg/dL — ABNORMAL HIGH (ref 70–99)
Glucose-Capillary: 217 mg/dL — ABNORMAL HIGH (ref 70–99)
Glucose-Capillary: 248 mg/dL — ABNORMAL HIGH (ref 70–99)

## 2020-04-03 LAB — PHOSPHORUS: Phosphorus: 3.2 mg/dL (ref 2.5–4.6)

## 2020-04-03 LAB — PREPARE RBC (CROSSMATCH)

## 2020-04-03 SURGERY — INCISION AND DRAINAGE, ABSCESS, PERIRECTAL
Anesthesia: General | Site: Buttocks

## 2020-04-03 MED ORDER — VASOPRESSIN 20 UNIT/ML IV SOLN
INTRAVENOUS | Status: DC | PRN
  Administered 2020-04-03: 1 [IU] via INTRAVENOUS
  Administered 2020-04-03: 2 [IU] via INTRAVENOUS

## 2020-04-03 MED ORDER — SODIUM CHLORIDE 0.9% IV SOLUTION: Freq: Once | INTRAVENOUS | Status: DC

## 2020-04-03 MED ORDER — FENTANYL CITRATE (PF) 250 MCG/5ML IJ SOLN
INTRAMUSCULAR | Status: AC
  Filled 2020-04-03: qty 5

## 2020-04-03 MED ORDER — FENTANYL 2500MCG IN NS 250ML (10MCG/ML) PREMIX INFUSION
25.0000 ug/h | INTRAVENOUS | Status: DC
  Administered 2020-04-03 – 2020-04-04 (×2): 100 ug/h via INTRAVENOUS
  Administered 2020-04-05: 150 ug/h via INTRAVENOUS
  Administered 2020-04-05: 100 ug/h via INTRAVENOUS
  Filled 2020-04-03 (×4): qty 250

## 2020-04-03 MED ORDER — SODIUM CHLORIDE 0.9 % IR SOLN
Status: DC | PRN
  Administered 2020-04-03: 3000 mL

## 2020-04-03 MED ORDER — SODIUM CHLORIDE 0.9 % IV SOLN: INTRAVENOUS | Status: DC | PRN

## 2020-04-03 MED ORDER — LACTATED RINGERS IV BOLUS
1000.0000 mL | Freq: Once | INTRAVENOUS | Status: AC
  Administered 2020-04-03: 1000 mL via INTRAVENOUS

## 2020-04-03 MED ORDER — NOREPINEPHRINE 4 MG/250ML-% IV SOLN
INTRAVENOUS | Status: AC
  Administered 2020-04-03: 6 mg
  Filled 2020-04-03: qty 250

## 2020-04-03 MED ORDER — NOREPINEPHRINE 16 MG/250ML-% IV SOLN
0.0000 ug/min | INTRAVENOUS | Status: DC
  Administered 2020-04-03 (×2): 40 ug/min via INTRAVENOUS
  Administered 2020-04-03: 30 ug/min via INTRAVENOUS
  Administered 2020-04-04: 40 ug/min via INTRAVENOUS
  Administered 2020-04-05: 20 ug/min via INTRAVENOUS
  Administered 2020-04-05: 39 ug/min via INTRAVENOUS
  Administered 2020-04-05: 21 ug/min via INTRAVENOUS
  Administered 2020-04-06: 9 ug/min via INTRAVENOUS
  Administered 2020-04-07: 16 ug/min via INTRAVENOUS
  Filled 2020-04-03 (×11): qty 250

## 2020-04-03 MED ORDER — SUGAMMADEX SODIUM 200 MG/2ML IV SOLN
INTRAVENOUS | Status: DC | PRN
  Administered 2020-04-03: 200 mg via INTRAVENOUS

## 2020-04-03 MED ORDER — ACETAMINOPHEN 10 MG/ML IV SOLN
INTRAVENOUS | Status: DC | PRN
  Administered 2020-04-03: 1000 mg via INTRAVENOUS

## 2020-04-03 MED ORDER — FENTANYL BOLUS VIA INFUSION
25.0000 ug | INTRAVENOUS | Status: DC | PRN
  Administered 2020-04-04 – 2020-04-05 (×4): 25 ug via INTRAVENOUS
  Filled 2020-04-03: qty 25

## 2020-04-03 MED ORDER — VASOPRESSIN 20 UNITS/100 ML INFUSION FOR SHOCK
INTRAVENOUS | Status: DC | PRN
  Administered 2020-04-03: .03 [IU]/min via INTRAVENOUS

## 2020-04-03 MED ORDER — ACETAMINOPHEN 10 MG/ML IV SOLN
INTRAVENOUS | Status: AC
  Filled 2020-04-03: qty 100

## 2020-04-03 MED ORDER — 0.9 % SODIUM CHLORIDE (POUR BTL) OPTIME
TOPICAL | Status: DC | PRN
  Administered 2020-04-03: 1000 mL

## 2020-04-03 MED ORDER — MIDAZOLAM HCL 2 MG/2ML IJ SOLN
INTRAMUSCULAR | Status: AC
  Filled 2020-04-03: qty 2

## 2020-04-03 MED ORDER — ALBUMIN HUMAN 5 % IV SOLN: INTRAVENOUS | Status: DC | PRN

## 2020-04-03 MED ORDER — OXYCODONE HCL 5 MG PO TABS
10.0000 mg | ORAL_TABLET | Freq: Four times a day (QID) | ORAL | Status: DC
  Administered 2020-04-03 – 2020-04-05 (×9): 10 mg via ORAL
  Filled 2020-04-03 (×9): qty 2

## 2020-04-03 MED ORDER — DOCUSATE SODIUM 50 MG/5ML PO LIQD
100.0000 mg | Freq: Two times a day (BID) | ORAL | Status: DC
  Administered 2020-04-04: 100 mg via ORAL
  Filled 2020-04-03: qty 10

## 2020-04-03 MED ORDER — POLYETHYLENE GLYCOL 3350 17 G PO PACK
17.0000 g | PACK | Freq: Every day | ORAL | Status: DC
  Administered 2020-04-04 – 2020-04-05 (×2): 17 g via ORAL
  Filled 2020-04-03 (×2): qty 1

## 2020-04-03 MED ORDER — VASOPRESSIN 20 UNITS/100 ML INFUSION FOR SHOCK
0.0000 [IU]/min | INTRAVENOUS | Status: AC
  Filled 2020-04-03 (×2): qty 100

## 2020-04-03 MED ORDER — ROCURONIUM BROMIDE 100 MG/10ML IV SOLN
INTRAVENOUS | Status: DC | PRN
  Administered 2020-04-03: 40 mg via INTRAVENOUS
  Administered 2020-04-03: 60 mg via INTRAVENOUS
  Administered 2020-04-03: 50 mg via INTRAVENOUS

## 2020-04-03 MED ORDER — FENTANYL CITRATE (PF) 100 MCG/2ML IJ SOLN: 25.0000 ug | Freq: Once | INTRAMUSCULAR | Status: DC

## 2020-04-03 MED ORDER — VASOPRESSIN 20 UNITS/100 ML INFUSION FOR SHOCK
0.0000 [IU]/min | INTRAVENOUS | Status: DC
  Administered 2020-04-03 – 2020-04-06 (×7): 0.03 [IU]/min via INTRAVENOUS
  Filled 2020-04-03 (×7): qty 100

## 2020-04-03 MED ORDER — ROCURONIUM BROMIDE 10 MG/ML (PF) SYRINGE
PREFILLED_SYRINGE | INTRAVENOUS | Status: AC
  Filled 2020-04-03: qty 20

## 2020-04-03 SURGICAL SUPPLY — 39 items
BNDG GAUZE ELAST 4 BULKY (GAUZE/BANDAGES/DRESSINGS) ×15 IMPLANT
BRIEF STRETCH FOR OB PAD LRG (UNDERPADS AND DIAPERS) IMPLANT
CANISTER SUCT 3000ML PPV (MISCELLANEOUS) IMPLANT
COVER SURGICAL LIGHT HANDLE (MISCELLANEOUS) ×3 IMPLANT
COVER WAND RF STERILE (DRAPES) ×3 IMPLANT
DRAPE HALF SHEET 40X57 (DRAPES) IMPLANT
DRAPE LAPAROTOMY 100X72 PEDS (DRAPES) IMPLANT
DRSG PAD ABDOMINAL 8X10 ST (GAUZE/BANDAGES/DRESSINGS) IMPLANT
ELECT REM PT RETURN 9FT ADLT (ELECTROSURGICAL) ×3
ELECTRODE REM PT RTRN 9FT ADLT (ELECTROSURGICAL) ×1 IMPLANT
GAUZE PACKING IODOFORM 1/2 (PACKING) IMPLANT
GAUZE SPONGE 4X4 12PLY STRL (GAUZE/BANDAGES/DRESSINGS) IMPLANT
GLOVE BIOGEL M STRL SZ7.5 (GLOVE) IMPLANT
GLOVE BIOGEL PI IND STRL 8 (GLOVE) IMPLANT
GLOVE BIOGEL PI INDICATOR 8 (GLOVE)
GOWN STRL REUS W/ TWL LRG LVL3 (GOWN DISPOSABLE) ×3 IMPLANT
GOWN STRL REUS W/TWL 2XL LVL3 (GOWN DISPOSABLE) IMPLANT
GOWN STRL REUS W/TWL LRG LVL3 (GOWN DISPOSABLE) ×9
HANDPIECE INTERPULSE COAX TIP (DISPOSABLE) ×3
KIT BASIN OR (CUSTOM PROCEDURE TRAY) ×3 IMPLANT
KIT TURNOVER KIT B (KITS) ×3 IMPLANT
MANIFOLD NEPTUNE II (INSTRUMENTS) ×3 IMPLANT
NS IRRIG 1000ML POUR BTL (IV SOLUTION) ×3 IMPLANT
PACK GENERAL/GYN (CUSTOM PROCEDURE TRAY) IMPLANT
PACK LITHOTOMY IV (CUSTOM PROCEDURE TRAY) IMPLANT
PAD ARMBOARD 7.5X6 YLW CONV (MISCELLANEOUS) ×3 IMPLANT
PENCIL SMOKE EVACUATOR (MISCELLANEOUS) ×3 IMPLANT
SET HNDPC FAN SPRY TIP SCT (DISPOSABLE) ×1 IMPLANT
SPONGE LAP 18X18 RF (DISPOSABLE) ×9 IMPLANT
SUT ETHILON 2 0 FS 18 (SUTURE) ×3 IMPLANT
SUT VIC AB 2-0 SH 18 (SUTURE) ×3 IMPLANT
SWAB COLLECTION DEVICE MRSA (MISCELLANEOUS) ×3 IMPLANT
SWAB CULTURE ESWAB REG 1ML (MISCELLANEOUS) ×3 IMPLANT
TOWEL GREEN STERILE (TOWEL DISPOSABLE) ×3 IMPLANT
TOWEL GREEN STERILE FF (TOWEL DISPOSABLE) ×3 IMPLANT
TUBE CONNECTING 12'X1/4 (SUCTIONS) ×1
TUBE CONNECTING 12X1/4 (SUCTIONS) ×2 IMPLANT
UNDERPAD 30X36 HEAVY ABSORB (UNDERPADS AND DIAPERS) ×3 IMPLANT
YANKAUER SUCT BULB TIP NO VENT (SUCTIONS) ×3 IMPLANT

## 2020-04-03 NOTE — Op Note (Signed)
   Operative Note   Date: 04/03/2020  Procedure: incision and debridement of sacrum, left gluteus  Pre-op diagnosis: necrotizing soft tissue infection Post-op diagnosis: same  Indication and clinical history: The patient is a 49 y.o. year old female with necrotizing soft tissue infection     Surgeon: [name], MD  Anesthesiologist: Ola Spurr, MD Anesthesia: General  Findings:  . Specimen: aerobic/anaerobic cultures  . EBL: 1.5L . Drains/Implants: kerlix x5 . Products administered: 4u pRBC, 500cc albumin  Disposition: ICU - intubated and critically ill.  Description of procedure: The patient was positioned supine on the operating room table. General anesthetic induction was uneventful. The patient was re-positioned to prone and prepped and draped in the usual sterile fashion. Time-out was performed verifying correct patient, procedure, signature of informed consent, and administration of pre-operative antibiotics.   The procedure began with obtaining aerobic and anaerobic cultures from the obvious site of drainage. Next, blunt dissection was performed to identify a tract extending laterally , which was incised sharply. Most of the overlying skin was viable, however deep, there was frankly necrotic tissue. This tissue was debrided with electrocautery and extended down to the level of bone at the sacrum and into the gluteal muscle laterally. Hemostasis was achieved. A pulse lavage was used to irrigate and hemostasis again obtained. The wound was packed with five dry kerlix rolls. The wound was measured to be 30cm x 28cm x 6cm. Three 2-0 nylon sutures were used to loosely approximate the wound flaps and provide some additional hemostasis. ABD pads were applied over this.   All sponge and instrument counts were correct at the conclusion of the procedure. The patient was awakened from anesthesia and transported to the ICU in critical condition. There were no complications.   The  expectation is to return to the OR 04/05/2020 for a second look operation and possible wound vac placement.  Clinical update provided to the patient's daughter via phone.    [name], MD General and [facility]

## 2020-04-03 NOTE — Consult Note (Signed)
CC: unable to obtain  Requesting provider: Dr Tamala Julian  HPI: Wanda Ramirez is an 49 y.o. female who we were called to evaluate for a sacral wound as the potential source for the patient's declining status.  Patient has been in the hospital 49 days with COVID-19 PNA.  History is obtained from the medical record as the patient is currently on ventilator support.  She was diagnosed with Covid on September 9; admitted on September 13, her condition worsened on September 24 and eventually got intubated.  She was extubated but then reintubated on October 8.  Comorbidities include severe obesity, obstructive sleep apnea, on chronic oxygen at home, history of hypertension, GERD, diabetes mellitus on insulin, asthma, end-stage renal disease on dialysis  She has been on multiple different antibiotics off and on during this hospitalization.  She had been febrile the past several days along with intermittent hypotension.  Antibiotics were restarted for potential staph pneumonia.  She was noted to have a sacral wound that was unstageable.  She was noticed to have some bloody drainage from this wound.  She had some worsening anemia and this required some blood transfusions today.  So far she has had a total of 4 units.  A CT scan this evening showed a large amount of soft gas throughout the left gluteal region, involving the left gluteal musculature and subcutaneous fat without evidence of enterocutaneous fistula or osteomyelitis; ongoing multifocal bilateral airspace disease consistent with Covid pneumonia  Patient was febrile throughout the weekend and had intermittent periods of hypotension over the weekend.  We were called to evaluate her sacral and gluteal area as a potential source of her current hemodynamic picture.  CCM is starting her on vasopressor.  Patient is currently full code.  The ultimate plan prior to the development of this current issue was long-term LTAC because of inability to wean her  from the ventilator.  .  Past Medical History:  Diagnosis Date  . Arthritis   . Asthma   . Chronic back pain   . Diabetes mellitus ([place])   . ESRD (end stage renal disease) on dialysis (HCC)    TTS  . Essential hypertension   . GERD (gastroesophageal reflux disease)   . Glaucoma   . Hyperlipidemia   . Hypothyroidism   . Morbid obesity ([place])   . Peripheral neuropathy   . Sleep apnea    wears CPAP, does not know setting    Past Surgical History:  Procedure Laterality Date  . AV FISTULA PLACEMENT Left 04/2014  . CARDIAC CATHETERIZATION N/A 03/17/2016   Procedure: Left Heart Cath and Coronary Angiography;  Surgeon: [name], MD;  Location: [facility];  Service: Cardiovascular;  Laterality: N/A;  . IR FLUORO GUIDE CV LINE LEFT  02/22/2020  . IR FLUORO GUIDE CV LINE LEFT  02/27/2020  . IR US GUIDE VASC ACCESS LEFT  02/22/2020  . KNEE SURGERY    . TUBAL LIGATION      Family History  Problem Relation Age of Onset  . Diabetes Mother   . Hyperlipidemia Mother   . Kidney disease Father     Social:  reports that she has quit smoking. She has never used smokeless tobacco. She reports that she does not drink alcohol and does not use drugs.  Allergies:  Allergies  Allergen Reactions  . Hydrocodone Itching and Nausea And Vomiting    Medications: I have reviewed the patient's current medications.  Results for orders placed or performed during the  hospital encounter of 02/13/20 (from the past 48 hour(s))  Basic metabolic panel     Status: Abnormal   Collection Time: 04/01/20  3:20 AM  Result Value Ref Range   Sodium 133 (L) 135 - 145 mmol/L   Potassium 4.3 3.5 - 5.1 mmol/L   Chloride 93 (L) 98 - 111 mmol/L   CO2 27 22 - 32 mmol/L   Glucose, Bld 255 (H) 70 - 99 mg/dL    Comment: Glucose reference range applies only to samples taken after fasting for at least 8 hours.   BUN 42 (H) 6 - 20 mg/dL   Creatinine, Ser 3.05 (H) 0.44 - 1.00 mg/dL    Comment:  DIALYSIS   Calcium 10.2 8.9 - 10.3 mg/dL   GFR, Estimated 18 (L) >60 mL/min    Comment: (NOTE) Calculated using the CKD-EPI Creatinine Equation (2021)    Anion gap 13 5 - 15    Comment: Performed at [facility] [address], [place], [place] [address]  CBC     Status: Abnormal   Collection Time: 04/01/20  3:20 AM  Result Value Ref Range   WBC 20.1 (H) 4.0 - 10.5 K/uL   RBC 2.60 (L) 3.87 - 5.11 MIL/uL   Hemoglobin 7.6 (L) 12.0 - 15.0 g/dL   HCT 24.2 (L) 36 - 46 %   MCV 93.1 80.0 - 100.0 fL   MCH 29.2 26.0 - 34.0 pg   MCHC 31.4 30.0 - 36.0 g/dL   RDW 18.3 (H) 11.5 - 15.5 %   Platelets 418 (H) 150 - 400 K/uL   nRBC 0.3 (H) 0.0 - 0.2 %    Comment: Performed at [facility] [address], [place], [place] [address]  Glucose, capillary     Status: Abnormal   Collection Time: 04/01/20  4:46 AM  Result Value Ref Range   Glucose-Capillary 217 (H) 70 - 99 mg/dL    Comment: Glucose reference range applies only to samples taken after fasting for at least 8 hours.   Comment 1 Notify RN   Glucose, capillary     Status: Abnormal   Collection Time: 04/01/20  8:27 AM  Result Value Ref Range   Glucose-Capillary 182 (H) 70 - 99 mg/dL    Comment: Glucose reference range applies only to samples taken after fasting for at least 8 hours.  Glucose, capillary     Status: Abnormal   Collection Time: 04/01/20 11:58 AM  Result Value Ref Range   Glucose-Capillary 195 (H) 70 - 99 mg/dL    Comment: Glucose reference range applies only to samples taken after fasting for at least 8 hours.  Glucose, capillary     Status: Abnormal   Collection Time: 04/01/20  4:15 PM  Result Value Ref Range   Glucose-Capillary 202 (H) 70 - 99 mg/dL    Comment: Glucose reference range applies only to samples taken after fasting for at least 8 hours.  Glucose, capillary     Status: Abnormal   Collection Time: 04/01/20  8:15 PM  Result Value Ref Range   Glucose-Capillary 214 (H) 70 - 99 mg/dL    Comment:  Glucose reference range applies only to samples taken after fasting for at least 8 hours.   Comment 1 Notify RN   Glucose, capillary     Status: Abnormal   Collection Time: 04/02/20 12:23 AM  Result Value Ref Range   Glucose-Capillary 245 (H) 70 - 99 mg/dL    Comment: Glucose reference range  applies only to samples taken after fasting for at least 8 hours.   Comment 1 Notify RN   Basic metabolic panel     Status: Abnormal   Collection Time: 04/02/20  3:55 AM  Result Value Ref Range   Sodium 130 (L) 135 - 145 mmol/L   Potassium 5.1 3.5 - 5.1 mmol/L   Chloride 92 (L) 98 - 111 mmol/L   CO2 23 22 - 32 mmol/L   Glucose, Bld 241 (H) 70 - 99 mg/dL    Comment: Glucose reference range applies only to samples taken after fasting for at least 8 hours.   BUN 80 (H) 6 - 20 mg/dL   Creatinine, Ser 4.62 (H) 0.44 - 1.00 mg/dL    Comment: DIALYSIS   Calcium 10.5 (H) 8.9 - 10.3 mg/dL   GFR, Estimated 11 (L) >60 mL/min    Comment: (NOTE) Calculated using the CKD-EPI Creatinine Equation (2021)    Anion gap 15 5 - 15    Comment: Performed at [facility] [address], [place] [address]  CBC     Status: Abnormal   Collection Time: 04/02/20  3:55 AM  Result Value Ref Range   WBC 26.6 (H) 4.0 - 10.5 K/uL   RBC 2.25 (L) 3.87 - 5.11 MIL/uL   Hemoglobin 6.6 (LL) 12.0 - 15.0 g/dL    Comment: REPEATED TO VERIFY THIS CRITICAL RESULT HAS VERIFIED AND BEEN CALLED TO RN Wanda Ramirez BY MESSAN HOUEGNIFIO ON 11 01 2021 AT 0429, AND HAS BEEN READ BACK.     HCT 21.0 (L) 36 - 46 %   MCV 93.3 80.0 - 100.0 fL   MCH 29.3 26.0 - 34.0 pg   MCHC 31.4 30.0 - 36.0 g/dL   RDW 19.0 (H) 11.5 - 15.5 %   Platelets 359 150 - 400 K/uL   nRBC 0.3 (H) 0.0 - 0.2 %    Comment: Performed at [facility] [address] [place], [place] [address]  Glucose, capillary     Status: Abnormal   Collection Time: 04/02/20  4:30 AM  Result Value Ref Range   Glucose-Capillary 211 (H) 70 - 99 mg/dL    Comment:  Glucose reference range applies only to samples taken after fasting for at least 8 hours.   Comment 1 Notify RN   Type and screen [facility]     Status: None (Preliminary result)   Collection Time: 04/02/20  6:42 AM  Result Value Ref Range   ABO/RH(D) O POS    Antibody Screen NEG    Sample Expiration 04/05/2020,2359    Unit Number [id number]    Blood Component Type RED CELLS,LR    Unit division 00    Status of Unit ISSUED    Transfusion Status OK TO TRANSFUSE    Crossmatch Result Compatible    Unit Number [id number]    Blood Component Type RED CELLS,LR    Unit division 00    Status of Unit ISSUED    Transfusion Status OK TO TRANSFUSE    Crossmatch Result      Compatible Performed at [facility], [place] [address] [place],  [address]    Unit Number [id number]    Blood Component Type RBC LR PHER1    Unit division 00    Status of Unit ISSUED    Transfusion Status OK TO TRANSFUSE    Crossmatch Result Compatible   Prepare RBC (crossmatch)     Status: None  Collection Time: 04/02/20  6:42 AM  Result Value Ref Range   Order Confirmation      ORDER PROCESSED BY BLOOD BANK Performed at [facility], [place] [address], [place], [place] [address]   Glucose, capillary     Status: Abnormal   Collection Time: 04/02/20  8:25 AM  Result Value Ref Range   Glucose-Capillary 271 (H) 70 - 99 mg/dL    Comment: Glucose reference range applies only to samples taken after fasting for at least 8 hours.  Glucose, capillary     Status: Abnormal   Collection Time: 04/02/20 11:32 AM  Result Value Ref Range   Glucose-Capillary 255 (H) 70 - 99 mg/dL    Comment: Glucose reference range applies only to samples taken after fasting for at least 8 hours.  Glucose, capillary     Status: Abnormal   Collection Time: 04/02/20  4:12 PM  Result Value Ref Range   Glucose-Capillary 144 (H) 70 - 99 mg/dL    Comment: Glucose reference range applies only to samples taken  after fasting for at least 8 hours.  Hemoglobin and hematocrit, blood     Status: Abnormal   Collection Time: 04/02/20  6:25 PM  Result Value Ref Range   Hemoglobin 7.0 (L) 12.0 - 15.0 g/dL   HCT 21.9 (L) 36 - 46 %    Comment: Performed at [facility] [address], [place], [place] [address]  Protime-INR     Status: None   Collection Time: 04/02/20  6:25 PM  Result Value Ref Range   Prothrombin Time 14.8 11.4 - 15.2 seconds   INR 1.2 0.8 - 1.2    Comment: (NOTE) INR goal varies based on device and disease states. Performed at [facility], [place] [address] [place], [place] [address]   Prepare RBC (crossmatch)     Status: None   Collection Time: 04/02/20  7:28 PM  Result Value Ref Range   Order Confirmation      ORDER PROCESSED BY BLOOD BANK Performed at [facility], [place] [address] [place], [place] [address]   Glucose, capillary     Status: Abnormal   Collection Time: 04/02/20  8:00 PM  Result Value Ref Range   Glucose-Capillary 138 (H) 70 - 99 mg/dL    Comment: Glucose reference range applies only to samples taken after fasting for at least 8 hours.  Glucose, capillary     Status: Abnormal   Collection Time: 04/02/20 11:48 PM  Result Value Ref Range   Glucose-Capillary 146 (H) 70 - 99 mg/dL    Comment: Glucose reference range applies only to samples taken after fasting for at least 8 hours.    CT ABDOMEN PELVIS W CONTRAST  Result Date: 04/02/2020 CLINICAL DATA:  Sacral pressure wound, concern for enterocutaneous fistula EXAM: CT ABDOMEN AND PELVIS WITH CONTRAST TECHNIQUE: Multidetector CT imaging of the abdomen and pelvis was performed using the standard protocol following bolus administration of intravenous contrast. CONTRAST:  15mL OMNIPAQUE IOHEXOL 300 MG/ML  SOLN COMPARISON:  03/29/2020 FINDINGS: Lower chest: Persistent multifocal bibasilar airspace disease consistent with history of COVID 19. Bullous changes are seen within the right middle lobe.  Bibasilar scarring. Enteric catheter extends into the gastric lumen. Central venous catheter tip extends to the level of the IVC, stable. Hepatobiliary: No focal liver abnormality is seen. Status post cholecystectomy. No biliary dilatation. Pancreas: Unremarkable. No pancreatic ductal dilatation or surrounding inflammatory changes. Spleen: Normal in size without focal abnormality.  Adrenals/Urinary Tract: Bilateral renal cortical atrophy consistent with end-stage renal disease. Stable punctate calcifications right kidney. Bladder is decompressed. The adrenals are unremarkable. Stomach/Bowel: No bowel obstruction or ileus. No wall thickening or inflammatory change. No evidence of bowel fistula. Vascular/Lymphatic: Aortic atherosclerosis. No enlarged abdominal or pelvic lymph nodes. Reproductive: Uterus and bilateral adnexa are unremarkable. Other: No free intraperitoneal fluid or free gas. No abdominal wall hernia. Musculoskeletal: Decubitus ulcer overlies the sacrococcygeal junction. There is extensive subcutaneous gas throughout the left gluteal region, involving the left gluteal musculature and subcutaneous fat. Asymmetric enlargement of the left gluteus maximus without intramuscular fluid collection or evidence of abscess. Overall, findings are consistent with myositis and cellulitis. I do not see any bony destruction to suggest osteomyelitis. Reconstructed images demonstrate no additional findings. IMPRESSION: 1. Sacral decubitus ulcer, with extensive subcutaneous and intramuscular gas involving the left gluteal region as above consistent with myositis and cellulitis. No fluid collection or abscess. No evidence of enterocutaneous fistula or osteomyelitis. 2. Multifocal bilateral airspace disease consistent with COVID 19 pneumonia. 3. Support devices as above. Electronically Signed   By: Randa Ngo M.D.   On: 04/02/2020 22:40   DG CHEST PORT 1 VIEW  Result Date: 04/03/2020 CLINICAL DATA:  Central line  placement EXAM: PORTABLE CHEST 1 VIEW COMPARISON:  04/02/2020 FINDINGS: Left subclavian central line is been placed with the tip in the upper SVC directed laterally. Dialysis catheter remains in place, unchanged. Diffuse bilateral airspace disease again noted, unchanged. Stable cardiomegaly. No effusions or visible pneumothorax. IMPRESSION: Left subclavian central line in place with the tip directed laterally in the upper SVC. Remainder of support devices are unchanged. Diffuse bilateral airspace disease, unchanged. Electronically Signed   By: Rolm Baptise M.D.   On: 04/03/2020 01:13   DG Chest Port 1 View  Result Date: 04/02/2020 CLINICAL DATA:  Healthcare associated pneumonia. EXAM: PORTABLE CHEST 1 VIEW COMPARISON:  04/01/2020 FINDINGS: The tracheostomy tube tip is above the carina. Feeding tube unchanged. Left IJ dialysis catheter is noted with tips in the right atrium. Bilateral interstitial and airspace opacities are unchanged from previous exam. IMPRESSION: 1. No change in aeration to the lungs compared with previous exam. 2. Stable support apparatus. Electronically Signed   By: Kerby Moors M.D.   On: 04/02/2020 07:26   DG Chest Port 1 View  Result Date: 04/01/2020 CLINICAL DATA:  Respiratory failure.  Follow-up exam. EXAM: PORTABLE CHEST 1 VIEW COMPARISON:  03/29/2020 and earlier studies. FINDINGS: Bilateral airspace lung opacities are without change from the most recent prior study. No new lung abnormalities. No pneumothorax. Tracheostomy tube, nasogastric tube and dual lumen, tunneled left internal jugular central venous catheter are all stable. IMPRESSION: 1. No change from the most recent prior exam. 2. Persistent bilateral airspace lung opacities. No change in the support apparatus. Electronically Signed   By: Lajean Manes M.D.   On: 04/01/2020 07:06    ROS - unable to obtain - pt on ventilator  PE Blood pressure (!) 83/48, pulse (!) 111, temperature 100.2 F (37.9 C), temperature  source Oral, resp. rate (!) 26, height 5\' 2"  (1.575 m), weight 107.5 kg, SpO2 100 %. Constitutional: chronically ill appearance; severe obesity; no deformities Neuro: no follow commands currently Eyes: Moist conjunctiva; no lid lag; anicteric; PERRL Neck: Trachea midline; no thyromegaly; trach site ok Lungs: Normal respiratory effort; no tactile fremitus CV: tachy no palpable thrills; no pitting edema GI: Abd soft, obese; no palpable hepatosplenomegaly MSK: no clubbing/cyanosis Psychiatric: intubated; no follow commands Lymphatic: No  palpable cervical or axillary lymphadenopathy Skin:open sacral wound with foul smelling odor - c/w necrotic tissue; draining reddish brownish fluid; open wound 4cm, able to track with finger to pt's left at least 4 cm; some mottling on skin around open wound  Results for orders placed or performed during the hospital encounter of 02/13/20 (from the past 48 hour(s))  Basic metabolic panel     Status: Abnormal   Collection Time: 04/01/20  3:20 AM  Result Value Ref Range   Sodium 133 (L) 135 - 145 mmol/L   Potassium 4.3 3.5 - 5.1 mmol/L   Chloride 93 (L) 98 - 111 mmol/L   CO2 27 22 - 32 mmol/L   Glucose, Bld 255 (H) 70 - 99 mg/dL    Comment: Glucose reference range applies only to samples taken after fasting for at least 8 hours.   BUN 42 (H) 6 - 20 mg/dL   Creatinine, Ser 3.05 (H) 0.44 - 1.00 mg/dL    Comment: DIALYSIS   Calcium 10.2 8.9 - 10.3 mg/dL   GFR, Estimated 18 (L) >60 mL/min    Comment: (NOTE) Calculated using the CKD-EPI Creatinine Equation (2021)    Anion gap 13 5 - 15    Comment: Performed at [facility] [address] [place], [place] [address]  CBC     Status: Abnormal   Collection Time: 04/01/20  3:20 AM  Result Value Ref Range   WBC 20.1 (H) 4.0 - 10.5 K/uL   RBC 2.60 (L) 3.87 - 5.11 MIL/uL   Hemoglobin 7.6 (L) 12.0 - 15.0 g/dL   HCT 24.2 (L) 36 - 46 %   MCV 93.1 80.0 - 100.0 fL   MCH 29.2 26.0 - 34.0 pg   MCHC 31.4 30.0  - 36.0 g/dL   RDW 18.3 (H) 11.5 - 15.5 %   Platelets 418 (H) 150 - 400 K/uL   nRBC 0.3 (H) 0.0 - 0.2 %    Comment: Performed at [facility] [address] [place], [place] [address]  Glucose, capillary     Status: Abnormal   Collection Time: 04/01/20  4:46 AM  Result Value Ref Range   Glucose-Capillary 217 (H) 70 - 99 mg/dL    Comment: Glucose reference range applies only to samples taken after fasting for at least 8 hours.   Comment 1 Notify RN   Glucose, capillary     Status: Abnormal   Collection Time: 04/01/20  8:27 AM  Result Value Ref Range   Glucose-Capillary 182 (H) 70 - 99 mg/dL    Comment: Glucose reference range applies only to samples taken after fasting for at least 8 hours.  Glucose, capillary     Status: Abnormal   Collection Time: 04/01/20 11:58 AM  Result Value Ref Range   Glucose-Capillary 195 (H) 70 - 99 mg/dL    Comment: Glucose reference range applies only to samples taken after fasting for at least 8 hours.  Glucose, capillary     Status: Abnormal   Collection Time: 04/01/20  4:15 PM  Result Value Ref Range   Glucose-Capillary 202 (H) 70 - 99 mg/dL    Comment: Glucose reference range applies only to samples taken after fasting for at least 8 hours.  Glucose, capillary     Status: Abnormal   Collection Time: 04/01/20  8:15 PM  Result Value Ref Range   Glucose-Capillary 214 (H) 70 - 99 mg/dL    Comment: Glucose reference range applies only to samples taken after  fasting for at least 8 hours.   Comment 1 Notify RN   Glucose, capillary     Status: Abnormal   Collection Time: 04/02/20 12:23 AM  Result Value Ref Range   Glucose-Capillary 245 (H) 70 - 99 mg/dL    Comment: Glucose reference range applies only to samples taken after fasting for at least 8 hours.   Comment 1 Notify RN   Basic metabolic panel     Status: Abnormal   Collection Time: 04/02/20  3:55 AM  Result Value Ref Range   Sodium 130 (L) 135 - 145 mmol/L   Potassium 5.1 3.5 - 5.1 mmol/L    Chloride 92 (L) 98 - 111 mmol/L   CO2 23 22 - 32 mmol/L   Glucose, Bld 241 (H) 70 - 99 mg/dL    Comment: Glucose reference range applies only to samples taken after fasting for at least 8 hours.   BUN 80 (H) 6 - 20 mg/dL   Creatinine, Ser 4.62 (H) 0.44 - 1.00 mg/dL    Comment: DIALYSIS   Calcium 10.5 (H) 8.9 - 10.3 mg/dL   GFR, Estimated 11 (L) >60 mL/min    Comment: (NOTE) Calculated using the CKD-EPI Creatinine Equation (2021)    Anion gap 15 5 - 15    Comment: Performed at [facility] [address] [place] [address]  CBC     Status: Abnormal   Collection Time: 04/02/20  3:55 AM  Result Value Ref Range   WBC 26.6 (H) 4.0 - 10.5 K/uL   RBC 2.25 (L) 3.87 - 5.11 MIL/uL   Hemoglobin 6.6 (LL) 12.0 - 15.0 g/dL    Comment: REPEATED TO VERIFY THIS CRITICAL RESULT HAS VERIFIED AND BEEN CALLED TO RN Wanda Ramirez BY MESSAN HOUEGNIFIO ON 11 01 2021 AT 0429, AND HAS BEEN READ BACK.     HCT 21.0 (L) 36 - 46 %   MCV 93.3 80.0 - 100.0 fL   MCH 29.3 26.0 - 34.0 pg   MCHC 31.4 30.0 - 36.0 g/dL   RDW 19.0 (H) 11.5 - 15.5 %   Platelets 359 150 - 400 K/uL   nRBC 0.3 (H) 0.0 - 0.2 %    Comment: Performed at [facility] [address] [place], [place] [address]  Glucose, capillary     Status: Abnormal   Collection Time: 04/02/20  4:30 AM  Result Value Ref Range   Glucose-Capillary 211 (H) 70 - 99 mg/dL    Comment: Glucose reference range applies only to samples taken after fasting for at least 8 hours.   Comment 1 Notify RN   Type and screen [facility]     Status: None (Preliminary result)   Collection Time: 04/02/20  6:42 AM  Result Value Ref Range   ABO/RH(D) O POS    Antibody Screen NEG    Sample Expiration 04/05/2020,2359    Unit Number [id number]    Blood Component Type RED CELLS,LR    Unit division 00    Status of Unit ISSUED    Transfusion Status OK TO TRANSFUSE    Crossmatch Result Compatible    Unit Number [id number]    Blood  Component Type RED CELLS,LR    Unit division 00    Status of Unit ISSUED    Transfusion Status OK TO TRANSFUSE    Crossmatch Result      Compatible Performed at [facility], [place] [address], [place], [place] [address]  Unit Number [id number]    Blood Component Type RBC LR PHER1    Unit division 00    Status of Unit ISSUED    Transfusion Status OK TO TRANSFUSE    Crossmatch Result Compatible   Prepare RBC (crossmatch)     Status: None   Collection Time: 04/02/20  6:42 AM  Result Value Ref Range   Order Confirmation      ORDER PROCESSED BY BLOOD BANK Performed at [facility], [address], [place], [place] [address]   Glucose, capillary     Status: Abnormal   Collection Time: 04/02/20  8:25 AM  Result Value Ref Range   Glucose-Capillary 271 (H) 70 - 99 mg/dL    Comment: Glucose reference range applies only to samples taken after fasting for at least 8 hours.  Glucose, capillary     Status: Abnormal   Collection Time: 04/02/20 11:32 AM  Result Value Ref Range   Glucose-Capillary 255 (H) 70 - 99 mg/dL    Comment: Glucose reference range applies only to samples taken after fasting for at least 8 hours.  Glucose, capillary     Status: Abnormal   Collection Time: 04/02/20  4:12 PM  Result Value Ref Range   Glucose-Capillary 144 (H) 70 - 99 mg/dL    Comment: Glucose reference range applies only to samples taken after fasting for at least 8 hours.  Hemoglobin and hematocrit, blood     Status: Abnormal   Collection Time: 04/02/20  6:25 PM  Result Value Ref Range   Hemoglobin 7.0 (L) 12.0 - 15.0 g/dL   HCT 21.9 (L) 36 - 46 %    Comment: Performed at [facility] [address], [place], [place] [address]  Protime-INR     Status: None   Collection Time: 04/02/20  6:25 PM  Result Value Ref Range   Prothrombin Time 14.8 11.4 - 15.2 seconds   INR 1.2 0.8 - 1.2    Comment: (NOTE) INR goal varies based on device and disease states. Performed at [facility], [place] [address] [place], [place] [address]   Prepare RBC (crossmatch)     Status: None   Collection Time: 04/02/20  7:28 PM  Result Value Ref Range   Order Confirmation      ORDER PROCESSED BY BLOOD BANK Performed at [facility], [place] [address], [place], [place] [address]   Glucose, capillary     Status: Abnormal   Collection Time: 04/02/20  8:00 PM  Result Value Ref Range   Glucose-Capillary 138 (H) 70 - 99 mg/dL    Comment: Glucose reference range applies only to samples taken after fasting for at least 8 hours.  Glucose, capillary     Status: Abnormal   Collection Time: 04/02/20 11:48 PM  Result Value Ref Range   Glucose-Capillary 146 (H) 70 - 99 mg/dL    Comment: Glucose reference range applies only to samples taken after fasting for at least 8 hours.    CT ABDOMEN PELVIS W CONTRAST  Result Date: 04/02/2020 CLINICAL DATA:  Sacral pressure wound, concern for enterocutaneous fistula EXAM: CT ABDOMEN AND PELVIS WITH CONTRAST TECHNIQUE: Multidetector CT imaging of the abdomen and pelvis was performed using the standard protocol following bolus administration of intravenous contrast. CONTRAST:  38mL OMNIPAQUE IOHEXOL 300 MG/ML  SOLN COMPARISON:  03/29/2020 FINDINGS: Lower chest: Persistent multifocal bibasilar airspace disease consistent with history of COVID 19. Bullous changes are seen within the right middle lobe.  Bibasilar scarring. Enteric catheter extends into the gastric lumen. Central venous catheter tip extends to the level of the IVC, stable. Hepatobiliary: No focal liver abnormality is seen. Status post cholecystectomy. No biliary dilatation. Pancreas: Unremarkable. No pancreatic ductal dilatation or surrounding inflammatory changes. Spleen: Normal in size without focal abnormality. Adrenals/Urinary Tract: Bilateral renal cortical atrophy consistent with end-stage renal disease. Stable punctate calcifications right kidney. Bladder is decompressed. The adrenals are  unremarkable. Stomach/Bowel: No bowel obstruction or ileus. No wall thickening or inflammatory change. No evidence of bowel fistula. Vascular/Lymphatic: Aortic atherosclerosis. No enlarged abdominal or pelvic lymph nodes. Reproductive: Uterus and bilateral adnexa are unremarkable. Other: No free intraperitoneal fluid or free gas. No abdominal wall hernia. Musculoskeletal: Decubitus ulcer overlies the sacrococcygeal junction. There is extensive subcutaneous gas throughout the left gluteal region, involving the left gluteal musculature and subcutaneous fat. Asymmetric enlargement of the left gluteus maximus without intramuscular fluid collection or evidence of abscess. Overall, findings are consistent with myositis and cellulitis. I do not see any bony destruction to suggest osteomyelitis. Reconstructed images demonstrate no additional findings. IMPRESSION: 1. Sacral decubitus ulcer, with extensive subcutaneous and intramuscular gas involving the left gluteal region as above consistent with myositis and cellulitis. No fluid collection or abscess. No evidence of enterocutaneous fistula or osteomyelitis. 2. Multifocal bilateral airspace disease consistent with COVID 19 pneumonia. 3. Support devices as above. Electronically Signed   By: Randa Ngo M.D.   On: 04/02/2020 22:40   DG CHEST PORT 1 VIEW  Result Date: 04/03/2020 CLINICAL DATA:  Central line placement EXAM: PORTABLE CHEST 1 VIEW COMPARISON:  04/02/2020 FINDINGS: Left subclavian central line is been placed with the tip in the upper SVC directed laterally. Dialysis catheter remains in place, unchanged. Diffuse bilateral airspace disease again noted, unchanged. Stable cardiomegaly. No effusions or visible pneumothorax. IMPRESSION: Left subclavian central line in place with the tip directed laterally in the upper SVC. Remainder of support devices are unchanged. Diffuse bilateral airspace disease, unchanged. Electronically Signed   By: Rolm Baptise M.D.   On:  04/03/2020 01:13   DG Chest Port 1 View  Result Date: 04/02/2020 CLINICAL DATA:  Healthcare associated pneumonia. EXAM: PORTABLE CHEST 1 VIEW COMPARISON:  04/01/2020 FINDINGS: The tracheostomy tube tip is above the carina. Feeding tube unchanged. Left IJ dialysis catheter is noted with tips in the right atrium. Bilateral interstitial and airspace opacities are unchanged from previous exam. IMPRESSION: 1. No change in aeration to the lungs compared with previous exam. 2. Stable support apparatus. Electronically Signed   By: Kerby Moors M.D.   On: 04/02/2020 07:26   DG Chest Port 1 View  Result Date: 04/01/2020 CLINICAL DATA:  Respiratory failure.  Follow-up exam. EXAM: PORTABLE CHEST 1 VIEW COMPARISON:  03/29/2020 and earlier studies. FINDINGS: Bilateral airspace lung opacities are without change from the most recent prior study. No new lung abnormalities. No pneumothorax. Tracheostomy tube, nasogastric tube and dual lumen, tunneled left internal jugular central venous catheter are all stable. IMPRESSION: 1. No change from the most recent prior exam. 2. Persistent bilateral airspace lung opacities. No change in the support apparatus. Electronically Signed   By: Lajean Manes M.D.   On: 04/01/2020 07:06    Imaging: Personally reviewed  A/P: ERANDI LEMMA is an 48 y.o. female with  Septic shock Large necrotizing soft tissue infection of her sacrum and left gluteal region Covid 19 infection Respiratory failure End-stage renal disease Metabolic encephalopathy Anemia critical and chronic illness Protein calorie malnutrition-albumin 1.5 Severe obesity  Her clinical exam and CT is consistent with a necrotizing soft tissue infection  Given her overall clinical picture I think the patient is extremely high risk for perioperative mortality even with surgical intervention.  Using the NSQIP calculator scoring system, I calculated her perioperative mortality of around 60% alone.  Prior to the  finding of the soft tissue infection CCM felt the patient had a very poor prognosis and now with the finding of a large soft tissue infection they feel that her prognosis is dismal.  I am forced to concur  I think surgery may temporize her but I do not think that surgical debridement will necessarily change her eventual prognosis  This would certainly lead a very large soft tissue wound that potentially would be a nonhealing wound  I contacted the daughter on phone and had an extensive conversation with her.  We discussed what surgery would involve such as debridement of skin, soft tissue muscle, requiring more than 1 trip to the operating room, blood transfusions, perioperative cardiac and pulmonary events, blood clot formation, nonhealing wound potential.  I discussed the NSQIP scores with the patient's daughter  The daughter did ask what would be the progression of this process.  I told her while the critical care team could keep her on "life support "; the infection would continue and ultimately overwhelm her system but if they decided not to operate I think there would be time for family to visit with her and have further discussions with CCM about changing her CODE STATUS from full code to DNR and potentially changing to comfort measures.  I explained that this would be an option and that they could discuss this further with CCM.  The daughter states that she did not feel comfortable making the decision without consulting her siblings.  She states that she would contact them and call the ICU back with her decision  I asked and answered all the questions that she had at this time  Discussed with CCM attending  Leighton Ruff. Redmond Pulling, MD, FACS General, Bariatric, & Minimally Invasive Surgery [facility], [place]

## 2020-04-03 NOTE — Progress Notes (Signed)
PT Cancellation Note  Patient Details Name: Wanda Ramirez MRN: [id number] DOB: 01/18/1971   Cancelled Treatment:    Reason Eval/Treat Not Completed: Patient not medically ready (pt back from sx with poor prognosis not currently able to participate. Medical decline and will sign off at this time. Please reorder as pt status is appropriate)   [name] B [name] 04/03/2020, 12:20 PM  Bayard Males, PT Acute Rehabilitation Services Pager: [phone] Office: [phone]

## 2020-04-03 NOTE — Procedures (Signed)
Central Venous Catheter Insertion Procedure Note  JAQUALA FULLER  [id number]  [birth year]  Date:04/03/20  Time:12:53 AM   Provider Performing:Lenya Sterne Shearon Stalls   Procedure: Insertion of Non-tunneled Central Venous Catheter(36556) without US guidance  Indication(s) Medication administration and Difficult access  Consent Unable to obtain consent due to emergent nature of procedure.  Anesthesia Topical only with 1% lidocaine   Timeout Verified patient identification, verified procedure, site/side was marked, verified correct patient position, special equipment/implants available, medications/allergies/relevant history reviewed, required imaging and test results available.  Sterile Technique Maximal sterile technique including full sterile barrier drape, hand hygiene, sterile gown, sterile gloves, mask, hair covering, sterile ultrasound probe cover (if used).  Procedure Description Area of catheter insertion was cleaned with chlorhexidine and draped in sterile fashion.  Without real-time ultrasound guidance a central venous catheter was placed into the left subclavian vein. Nonpulsatile blood flow and easy flushing noted in all ports.  The catheter was sutured in place and sterile dressing applied.  Complications/Tolerance None; patient tolerated the procedure well. Chest X-ray is ordered to verify placement for internal jugular or subclavian cannulation.   Chest x-ray is not ordered for femoral cannulation.  EBL Minimal  Specimen(s) None   Montey Hora, [place] [facility] 04/03/2020, 12:54 AM

## 2020-04-03 NOTE — Transfer of Care (Signed)
Immediate Anesthesia Transfer of Care Note  Patient: Wanda Ramirez  Procedure(s) Performed: EXCISIONAL DEBRIDEMENT OF SACRAL AND GLUTEAL WOUNDS (N/A Buttocks)  Patient Location: ICU  Anesthesia Type:General  Level of Consciousness: Patient remains intubated per anesthesia plan  Airway & Oxygen Therapy: Patient remains intubated per anesthesia plan and Patient placed on Ventilator (see vital sign flow sheet for setting)  Post-op Assessment: Report given to RN and Post -op Vital signs reviewed and stable  Post vital signs: Reviewed and stable  Last Vitals:  Vitals Value Taken Time  BP    Temp    Pulse 102 04/03/20 0848  Resp 22 04/03/20 0848  SpO2 100 % 04/03/20 0848  Vitals shown include unvalidated device data.  Last Pain:  Vitals:   04/03/20 0349  TempSrc: Axillary  PainSc:       Patients Stated Pain Goal: 0 (57/49/35 5217)  Complications: No complications documented.

## 2020-04-03 NOTE — Anesthesia Preprocedure Evaluation (Addendum)
Anesthesia Evaluation  Patient identified by MRN, date of birth, ID band Patient awake    Reviewed: Allergy & Precautions, H&P , NPO status , Patient's Chart, lab work & pertinent test results  Airway Mallampati: Trach       Dental no notable dental hx. (+) Teeth Intact, Dental Advisory Given   Pulmonary asthma , sleep apnea , pneumonia, former smoker,  VDRF   Pulmonary exam normal  + decreased breath sounds      Cardiovascular hypertension,  Rhythm:Regular Rate:Normal     Neuro/Psych negative neurological ROS  negative psych ROS   GI/Hepatic negative GI ROS, Neg liver ROS,   Endo/Other  diabetes, Insulin Dependent, Oral Hypoglycemic AgentsHypothyroidism Morbid obesity  Renal/GU ESRF and DialysisRenal disease  negative genitourinary   Musculoskeletal  (+) Arthritis , Osteoarthritis,    Abdominal   Peds  Hematology negative hematology ROS (+)   Anesthesia Other Findings   Reproductive/Obstetrics negative OB ROS                             Anesthesia Physical Anesthesia Plan  ASA: IV  Anesthesia Plan: General   Post-op Pain Management:    Induction: Intravenous  PONV Risk Score and Plan: 3 and Treatment may vary due to age or medical condition  Airway Management Planned: Tracheostomy  Additional Equipment: Arterial line  Intra-op Plan:   Post-operative Plan: Post-operative intubation/ventilation  Informed Consent: I have reviewed the patients History and Physical, chart, labs and discussed the procedure including the risks, benefits and alternatives for the proposed anesthesia with the patient or authorized representative who has indicated his/her understanding and acceptance.     Dental advisory given  Plan Discussed with: CRNA  Anesthesia Plan Comments: (I spoke with the patients daughter about the severity of her mothers condition and that we would do all we could to  get her through the surgery. Questions were answered. Daughter understands the risks.)       Anesthesia Quick Evaluation

## 2020-04-03 NOTE — Progress Notes (Signed)
Patient transported on vent from OR to [id number] without complications.

## 2020-04-03 NOTE — Progress Notes (Signed)
[facility] Progress Note   OP HD:TTS 4h 12mn 450/800 2/2.25 bath 111.5kg Hep 2000 L AVF - darbe 50 ug q week, last 9/7 - calc 1.0 tiw - 9/9 Hb 10.0, tsat 22%  49yo female w/ ESRD on TTS HD admitted for COVID PNA on 02/13/20. She was intubated. SP CRRT around 9/26- 10/7. Now is on intermittent HD. She is sp trach. She had MSSA pna. DM2.  Assessment/ Plan:   1. COVID pna /sp trach -trying to wean from vent, not having succes 2. Septic shock - on 2 pressors, and going up 3. Sacral decub w/ necrosis - sp I&D today by gen surg 4. SP MSSA HCAP/ VAP - finished course of abx.  5. ESRD -usual HD TTS.sp CRRT 9/26- 10/7. - HD tomorrow in ICU  4. BP/ volume -euvolemic on exam, minimal edema, 4-7kg under 5. HD access: her LUA AVF clotted while here, will need new perm access when more stable. TDC x2 here by IR.[facility] TC 6. 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 Phos 3.9 10/30 8. DM2 - per pmd 9. GRobert Lee- had family meeting today, family is coming from out of town and then most likely will transition to comfort care, perhaps on thursday  Rob SCleburne MD 04/03/2020, 3:49 PM      Subjective:   Seen in ICU, asleep   Objective:   BP (!) 127/50   Pulse (!) 110   Temp 98.8 F (37.1 C) (Axillary)   Resp (!) 38   Ht 5' 2" (1.575 m)   Wt 105.6 kg   SpO2 100%   BMI 42.58 kg/m   Intake/Output Summary (Last 24 hours) at 04/03/2020 1549 Last data filed at 04/03/2020 1503 Gross per 24 hour  Intake 7100.32 ml  Output 3600 ml  Net 3500.32 ml   Weight change: 0 kg  Physical Exam: More alert, NG in , +trach collar CVS-tachy ABD- BS present soft non-distended  EXT- no edema LE's, lt arm access thrombosed ACCESS: [facility] TC  Imaging: CT ABDOMEN PELVIS W CONTRAST  Result Date: 04/02/2020 CLINICAL DATA:  Sacral pressure wound, concern for enterocutaneous fistula EXAM: CT  ABDOMEN AND PELVIS WITH CONTRAST TECHNIQUE: Multidetector CT imaging of the abdomen and pelvis was performed using the standard protocol following bolus administration of intravenous contrast. CONTRAST:  843mOMNIPAQUE IOHEXOL 300 MG/ML  SOLN COMPARISON:  03/29/2020 FINDINGS: Lower chest: Persistent multifocal bibasilar airspace disease consistent with history of COVID 19. Bullous changes are seen within the right middle lobe. Bibasilar scarring. Enteric catheter extends into the gastric lumen. Central venous catheter tip extends to the level of the IVC, stable. Hepatobiliary: No focal liver abnormality is seen. Status post cholecystectomy. No biliary dilatation. Pancreas: Unremarkable. No pancreatic ductal dilatation or surrounding inflammatory changes. Spleen: Normal in size without focal abnormality. Adrenals/Urinary Tract: Bilateral renal cortical atrophy consistent with end-stage renal disease. Stable punctate calcifications right kidney. Bladder is decompressed. The adrenals are unremarkable. Stomach/Bowel: No bowel obstruction or ileus. No wall thickening or inflammatory change. No evidence of bowel fistula. Vascular/Lymphatic: Aortic atherosclerosis. No enlarged abdominal or pelvic lymph nodes. Reproductive: Uterus and bilateral adnexa are unremarkable. Other: No free intraperitoneal fluid or free gas. No abdominal wall hernia. Musculoskeletal: Decubitus ulcer overlies the sacrococcygeal junction. There is extensive subcutaneous gas throughout the left gluteal region, involving the left gluteal musculature and subcutaneous fat. Asymmetric enlargement of the left gluteus maximus  without intramuscular fluid collection or evidence of abscess. Overall, findings are consistent with myositis and cellulitis. I do not see any bony destruction to suggest osteomyelitis. Reconstructed images demonstrate no additional findings. IMPRESSION: 1. Sacral decubitus ulcer, with extensive subcutaneous and intramuscular gas  involving the left gluteal region as above consistent with myositis and cellulitis. No fluid collection or abscess. No evidence of enterocutaneous fistula or osteomyelitis. 2. Multifocal bilateral airspace disease consistent with COVID 19 pneumonia. 3. Support devices as above. Electronically Signed   By: Randa Ngo M.D.   On: 04/02/2020 22:40   DG CHEST PORT 1 VIEW  Result Date: 04/03/2020 CLINICAL DATA:  Central line placement EXAM: PORTABLE CHEST 1 VIEW COMPARISON:  04/02/2020 FINDINGS: Left subclavian central line is been placed with the tip in the upper SVC directed laterally. Dialysis catheter remains in place, unchanged. Diffuse bilateral airspace disease again noted, unchanged. Stable cardiomegaly. No effusions or visible pneumothorax. IMPRESSION: Left subclavian central line in place with the tip directed laterally in the upper SVC. Remainder of support devices are unchanged. Diffuse bilateral airspace disease, unchanged. Electronically Signed   By: Rolm Baptise M.D.   On: 04/03/2020 01:13   DG Chest Port 1 View  Result Date: 04/02/2020 CLINICAL DATA:  Healthcare associated pneumonia. EXAM: PORTABLE CHEST 1 VIEW COMPARISON:  04/01/2020 FINDINGS: The tracheostomy tube tip is above the carina. Feeding tube unchanged. Left IJ dialysis catheter is noted with tips in the right atrium. Bilateral interstitial and airspace opacities are unchanged from previous exam. IMPRESSION: 1. No change in aeration to the lungs compared with previous exam. 2. Stable support apparatus. Electronically Signed   By: Kerby Moors M.D.   On: 04/02/2020 07:26    Labs: BMET Recent Labs  Lab 03/29/20 0245 03/29/20 0245 03/30/20 0139 03/30/20 0139 03/31/20 0119 03/31/20 0119 04/01/20 0320 04/01/20 0320 04/02/20 0355 04/02/20 0355 04/03/20 0220 04/03/20 8270 04/03/20 0745 04/03/20 0821 04/03/20 0907 04/03/20 1023 04/03/20 1030  NA 134*   < > 134*   < > 132*   < > 133*   < > 130*   < > 132* 134* 133*  134* 133* 133* 133*  K 3.5   < > 4.9   < > 5.2*   < > 4.3   < > 5.1   < > 4.4 4.7 5.1 5.2* 5.0 4.8 4.8  CL 95*  --  94*  --  92*  --  93*  --  92*  --  93*  --   --   --   --  97*  --   CO2 24  --  25  --  24  --  27  --  23  --  25  --   --   --   --  24  --   GLUCOSE 94  --  182*  --  156*  --  255*  --  241*  --  163*  --   --   --   --  130*  --   BUN 58*  --  44*  --  75*  --  42*  --  80*  --  40*  --   --   --   --  47*  --   CREATININE 6.26*  --  3.98*  --  5.40*  --  3.05*  --  4.62*  --  2.58*  --   --   --   --  2.87*  --   CALCIUM 10.4*  --  9.7  --  10.6*  --  10.2  --  10.5*  --  9.2  --   --   --   --  8.7*  --   PHOS 5.2*  --  3.2  --  3.9  --   --   --   --   --  3.2  --   --   --   --   --   --    < > = values in this interval not displayed.   CBC Recent Labs  Lab 04/01/20 0320 04/01/20 0320 04/02/20 0355 04/02/20 1825 04/03/20 0220 04/03/20 0652 04/03/20 0821 04/03/20 0907 04/03/20 1023 04/03/20 1030  WBC 20.1*  --  26.6*  --  19.6*  --   --   --  20.4*  --   HGB 7.6*   < > 6.6*   < > 7.3*   < > 9.5* 9.2* 9.1* 9.2*  HCT 24.2*   < > 21.0*   < > 23.0*   < > 28.0* 27.0* 28.2* 27.0*  MCV 93.1  --  93.3  --  90.6  --   --   --  90.1  --   PLT 418*  --  359  --  294  --   --   --  234  --    < > = values in this interval not displayed.    Medications:    . sodium chloride   Intravenous Once  . sodium chloride   Intravenous Once  . B-complex with vitamin C  1 tablet Per Tube Daily  . chlorhexidine gluconate (MEDLINE KIT)  15 mL Mouth Rinse BID  . Chlorhexidine Gluconate Cloth  6 each Topical Q0600  . clonazepam  0.5 mg Per Tube TID  . collagenase   Topical Daily  . darbepoetin (ARANESP) injection - DIALYSIS  150 mcg Intravenous Q Tue-HD  . docusate  100 mg Oral BID  . feeding supplement (PROSource TF)  45 mL Per Tube BID  . fentaNYL (SUBLIMAZE) injection  25 mcg Intravenous Once  . fiber  1 packet Per Tube BID  . hydrocerin   Topical Daily  . insulin aspart   0-20 Units Subcutaneous Q4H  . insulin aspart  5 Units Subcutaneous Q4H  . insulin detemir  16 Units Subcutaneous BID  . levothyroxine  125 mcg Per Tube Q0600  . mouth rinse  15 mL Mouth Rinse 10 times per day  . midodrine  10 mg Oral TID WC  . oxyCODONE  10 mg Oral Q6H  . pantoprazole sodium  40 mg Per Tube BID  . polyethylene glycol  17 g Oral Daily  . QUEtiapine  100 mg Per Tube BID  . sodium chloride flush  10-40 mL Intracatheter Q12H  . sodium chloride flush  3 mL Intravenous Q12H  . vancomycin variable dose per unstable renal function (pharmacist dosing)   Does not apply See admin instructions

## 2020-04-03 NOTE — Anesthesia Postprocedure Evaluation (Signed)
Anesthesia Post Note  Patient: Wanda Ramirez  Procedure(s) Performed: EXCISIONAL DEBRIDEMENT OF SACRAL AND GLUTEAL WOUNDS (N/A Buttocks)     Patient location during evaluation: SICU Anesthesia Type: General Level of consciousness: sedated Pain management: pain level controlled Vital Signs Assessment: post-procedure vital signs reviewed and stable Respiratory status: patient on ventilator - see flowsheet for VS (Tracheostomy ) Cardiovascular status: stable Postop Assessment: no apparent nausea or vomiting Anesthetic complications: no   No complications documented.  Last Vitals:  Vitals:   04/03/20 0910 04/03/20 1109  BP: (!) 121/44   Pulse: (!) 104   Resp: (!) 35   Temp:  37.1 C  SpO2: 100%     Last Pain:  Vitals:   04/03/20 1109  TempSrc: Axillary  PainSc:                  Karyl Kinnier Dulcie Gammon

## 2020-04-03 NOTE — Progress Notes (Addendum)
Daughter called back and said her and siblings decided to proceed with surgery  I again reiterated my concern that she is very high risk for surgery and that surgery in my opinion will not change her eventual outcome of dying.   She asked that we proceed.   Pt's nurse was present for both my original and followup conversation with the pt's daughter.   Cbc & coags pending.  Pt has been off of tube since early 11/1 per nurse On broad spectrum abx  Leighton Ruff. Redmond Pulling, MD, FACS General, Bariatric, & Minimally Invasive Surgery [facility], [place]

## 2020-04-03 NOTE — Progress Notes (Addendum)
NAME:  Wanda Ramirez, MRN:  [id number], DOB:  [birth year], LOS: 60 ADMISSION DATE:  02/13/2020, CONSULTATION DATE:  9/24 REFERRING MD:  Heber Brewton, CHIEF COMPLAINT:  Dyspnea   Brief History   49 yo female admitted with COVID 19 pneumonia on 9/13, had been diagnosed with COVID on 9/9.  On 9/24 her condition worsened and PCCM was consulted, she was sent to the ICU and treated with BIPAP, then eventually intubated.  Started on CRRT through a tunneled HD cath. Extubated on 10/6 but re-intubated on 10/8.  Has been challenged by sedation needs since admission.   Past Medical History  Sleep apnea Morbid obesity Hypothyroidism Hypertension GERD  End-stage renal disease on HD MWF Diabetes Asthma  Significant Hospital Events   9/14 admit 9/25 on BiPAP with Precedex overnight 9/26 -unable to run CRRT with prone ventilation.  Resume supine ventilation but still unable to draw blood back. 9/27 transferred to 38m. IR replaced HD catheter 10/1 on CRRT and vasopressors 10/6 - extubated 10/7 - had some stridor and increased WOB so placed on BIPAP and dexamethasone given.  10/15 weaning sedation. midaz off 10/16 move to 64M 10/18 s/p 6 proximal XLT Shiley tracheostomy  10/19: Resumed Eliquis low dose. Per Dr. Thompson Caul note 10/7 will need to be on [facility] for 1 month then reasses 10/19: Episode of hypoglycemia 55. 10/21 hypoglycemic again. levemir and tubefeed coverage decreased. Low grade temp->sputum culture send had vomiting event on 10/19 w/ concern for aspiration. Fent was stopped 10/20. Still gets anxious at night. Required increased precedex. Added clonazepam. 10/22 attempting PSV trials  10/25 precedex drip weaned off 10/27 precedex restarted 10/28 Klonopin and seroquel added to attempt to get back off precedex gtt 10/31 started on vanco due to staph sputum  Consults:  Nephrology  Procedures:  9/23 tunneled HD cath placement >  10/8 ETT > 10/18 10/18 6 proximal XLT Shiley tracheostomy   >  Significant Diagnostic Tests:  9/21 Vas ultrasound> thrombosed LUE fistula 9/21 VQ scan > negative for PE 9/29 echo> LVEF 60-65%, flat ventrcile consistent with RV pressure overload, RV severely enlarged, moderate reduction in RV function  Micro Data:  9/13 blood > neg   9/14 sars cov 2 > pos 9/26 blood > neg 10/8 resp > MSSA 10/8 blood > negative  10/21 sputum > Normal flora  10/25 Blood > ngtd x 3 days 10/25 Sputum  > few diphtheroids  10/25 Blood >>negative 10/29 Sputum >> Staph aureus  Antimicrobials:  9/14 remdesivir > 9/19 9/15 tocilizumab > x1 9/20 cefepime > 9/29 9/20 azithro >  9/25 10/8 vanc > 10/9 10/8 zosyn > x1 10/10 ceftriaxone > 10/16 10/31 vancomycin >>   Interim history/subjective:  Began to have some feculent oozing and bleeding from sacral wound. Taken for CT to evaluate for enterocutaneous fistula. No fistula, but CT was significant for gas in the surrounding soft tissues. Surgery was consulted and took the patient for extensive surgical debridement. Back in ICU this morning on escalated doses of pressors.   Objective   Blood pressure (!) 119/48, pulse (!) 108, temperature 98.7 F (37.1 C), temperature source Axillary, resp. rate (!) 35, height 5\' 2"  (1.575 m), weight 105.6 kg, SpO2 100 %.    Vent Mode: PRVC FiO2 (%):  [40 %-60 %] 40 % Set Rate:  [18 bmp-35 bmp] 35 bmp Vt Set:  [400 mL-450 mL] 400 mL PEEP:  [5 cmH20] 5 cmH20 Pressure Support:  [10 cmH20] 10 cmH20 Plateau Pressure:  [18 QIW97-98 cmH20]  23 cmH20   Intake/Output Summary (Last 24 hours) at 04/03/2020 1235 Last data filed at 04/03/2020 1007 Gross per 24 hour  Intake 7610.32 ml  Output 3600 ml  Net 4010.32 ml   Filed Weights   04/02/20 0500 04/02/20 1316 04/03/20 0400  Weight: 107.5 kg 107.5 kg 105.6 kg   Examination: General:  Morbidly obese female on vent via trach.  Neuro:  Sedated.  HEENT:  [place]/AT, No JVD noted, PERRL  Cardiovascular:  Tachy, regular, no MRG Lungs:  Vent  assisted breaths, no significant secretions. Syn Abdomen:  Soft, non-distended, non-tender.  Musculoskeletal:  No acute deformity Skin:  Intact, MMM. Sacral wound not assessed.    Resolved Hospital Problem list   Thrombocytopenia  MSSA PNA  Nausea   Assessment & Plan:   Acute hypoxic respiratory failure from COVID 19 pneumonia with ARDS complicated by MSSA PNA. Failure to wean s/p tracheostomy. - Full vent support - No weaning today.  - routine trach care - no insurance benefits for LTACH - Transition to DNR, family planning to change to comfort care once family gets in from [place].   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  -  goal MAP > 80mmHg - Family requesting no additional surgery.  - norepinephrine and vasopressin infusions.  - Zosyn, Vancomycin  Staph tracheobronchitis vs pneumonia.  MSSA - ABX as above  Acute metabolic encephalopathy in setting of sepsis. Agitated delirium. - RASS goal 0 - continue klonopin, oxycodone, seroquel  Anemia r/t to blood loss from sacrum and anemia of critical illness. S/p 4 units PRBC - fu/ CBC - transfuse for Hb < 7 or significant bleeding  Hypothyroidism - continue synthroid  DM type 2 with labile blood sugars. - SSI with lantus and tube feed coverage - Lantus 16 units BID  Dysphagia. - IR has seen with plans for PEG this week, but with GOC plans changing it will likely not be necessary. Continue with cortrak.   Family discussion today. Dr. Jonnie Finner and Dr. Bobbye Morton present. Family elects for DNR and transition to comfort care.   Best practice:  Diet: tube feeding DVT prophylaxis: Heparin  GI prophylaxis: Protonix Mobility: Bed rest Code Status: DNR Disposition: ICU  Labs:   CMP Latest Ref Rng & Units 04/03/2020 04/03/2020 04/03/2020  Glucose 70 - 99 mg/dL - 130(H) -  BUN 6 - 20 mg/dL - 47(H) -  Creatinine 0.44 - 1.00 mg/dL - 2.87(H) -  Sodium 135 - 145  mmol/L 133(L) 133(L) 133(L)  Potassium 3.5 - 5.1 mmol/L 4.8 4.8 5.0  Chloride 98 - 111 mmol/L - 97(L) -  CO2 22 - 32 mmol/L - 24 -  Calcium 8.9 - 10.3 mg/dL - 8.7(L) -  Total Protein 6.5 - 8.1 g/dL - 5.5(L) -  Total Bilirubin 0.3 - 1.2 mg/dL - 3.8(H) -  Alkaline Phos 38 - 126 U/L - 229(H) -  AST 15 - 41 U/L - 60(H) -  ALT 0 - 44 U/L - 18 -    CBC Latest Ref Rng & Units 04/03/2020 04/03/2020 04/03/2020  WBC 4.0 - 10.5 K/uL - 20.4(H) -  Hemoglobin 12.0 - 15.0 g/dL 9.2(L) 9.1(L) 9.2(L)  Hematocrit 36 - 46 % 27.0(L) 28.2(L) 27.0(L)  Platelets 150 - 400 K/uL - 234 -    ABG    Component Value Date/Time   PHART 7.250 (L) 04/03/2020 1030   PCO2ART 64.8 (H) 04/03/2020 1030   PO2ART 156 (H) 04/03/2020 1030  HCO3 28.4 (H) 04/03/2020 1030   TCO2 30 04/03/2020 1030   ACIDBASEDEF 2.0 04/03/2020 0907   O2SAT 99.0 04/03/2020 1030    CBG (last 3)  Recent Labs    04/03/20 0338 04/03/20 0847 04/03/20 1108  GLUCAP 131* 124* 142*    Critical care time: 48 minutes    [name], AGACNP-BC [facility] for personal pager PCCM on call pager [phone]  04/03/2020 12:35 PM

## 2020-04-03 NOTE — Progress Notes (Signed)
CRITICAL VALUE ALERT  Critical Value:  Lactic acid 2.1  Date & Time Notied:  04/03/20 @ 0315  Provider Notified: Dr Gillermina Phy

## 2020-04-03 NOTE — Progress Notes (Signed)
Evaluated backside and placed images under media. Steady moderate flow of blood and fecal appearing material from ~4 cm defect in sacrum. Called and updated daughter informing her that I am speaking with surgeon but there may not be a solution here.  [name] MD PCCM

## 2020-04-03 NOTE — Progress Notes (Signed)
Pt taken to OR this am for procedure and tubed down consent form for procedure along w/facesheet and x1 PRBCs (green sheet) to station 75 per there OR request.

## 2020-04-03 NOTE — Anesthesia Procedure Notes (Signed)
Arterial Line Insertion Start/End11/07/2019 6:45 AM, 04/03/2020 6:55 AM Performed by: Roderic Palau, MD, anesthesiologist  Patient location: OR. Preanesthetic checklist: patient identified, IV checked, site marked, risks and benefits discussed, surgical consent, monitors and equipment checked, pre-op evaluation, timeout performed and anesthesia consent Lidocaine 1% used for infiltration Right, brachial was placed Catheter size: 20 Fr Hand hygiene performed , maximum sterile barriers used  and Seldinger technique used  Attempts: 1 Procedure performed using ultrasound guided technique. Ultrasound Notes:anatomy identified, needle tip was noted to be adjacent to the nerve/plexus identified, no ultrasound evidence of intravascular and/or intraneural injection and image(s) printed for medical record Following insertion, dressing applied, Biopatch and line sutured. Post procedure assessment: normal and unchanged  Patient tolerated the procedure well with no immediate complications.

## 2020-04-03 NOTE — Progress Notes (Signed)
Nutrition Follow-up  DOCUMENTATION CODES:   Morbid obesity  INTERVENTION:   - RD will monitor for ability to restart tube feeds  NUTRITION DIAGNOSIS:   Inadequate oral intake related to inability to eat as evidenced by NPO status.  Ongoing  GOAL:   Patient will meet greater than or equal to 90% of their needs  Unmet at this time  MONITOR:   Vent status, Labs, Weight trends, Skin, I & O's  REASON FOR ASSESSMENT:   Consult Assessment of nutrition requirement/status  ASSESSMENT:   49 year old female with a past medical history significant for end-stage renal disease on hemodialysis MWF, chronic hypoxic respiratory failure on 3 L nasal cannula at baseline, sleep apnea, diabetes, hypertension, hyperlipidemia, hypothyroidism, and asthma presents with complaints of worsening shortness of breath. Previously diagnosed with COVID PNA on 9/9.  09/28 - start CRRT 10/04 - cortrak placed; tip gastric 10/07 - end CRRT 10/08 - pt reintubated 10/14 - s/p iHD 10/18 - trach 11/02 - s/p I&D of sacrum, left gluteus  Pt found to have necrotizing soft tissue infection. Tube feeds have been off since 11/01 due to concern for EC fistula. Per CT scan, no evidence of EC fistula. Plan is for pt to return to the OR on 11/04 for a second look operation and possible wound VAC placement.  Discussed pt with RN and during ICU rounds. Pt is declining medically. No plans to restart tube feeds today. Cortrak remains in place. IR has been consulted for G-tube placement. RD will monitor for goals of care and nutrition plan.  Last HD was on 11/01 with 2000 ml net UF.  EDW: 111.5 kg  Current weight: 105.6 kg  Per RN edema assessment, pt continues to have non-pitting edema to BUE and BLE.  Patient remains on ventilator support via trach. MV: 13.8 L/min Temp (24hrs), Avg:100.7 F (38.2 C), Min:98.7 F (37.1 C), Max:103 F (39.4 C) BP (a-line): 117/47 MAP (a-line):  64  Drips: Fentanyl Levophed Vasopressin  Medications reviewed and include: B-complex with vitamin C, aranesp, colace, nutrisource fiber BID, SSI q 4 hours, Novolog 5 units q 4 hours, levemir 16 units BID, protonix, miralax, IV abx  Labs reviewed: sodium 133, BUN 47, creatinine 2.87, hemoglobin 9.2 CBG's: 124-146 x 24 hours  Stool: 150 ml x 24 hours I/O's: +17.7 L since admit  Diet Order:   Diet Order    None      EDUCATION NEEDS:   Not appropriate for education at this time  Skin:  Skin Assessment: Skin Integrity Issues: Stage II: right neck Unstageable: mid sacrum Incisions: buttocks  Last BM:  04/02/20 rectal tube  Height:   Ht Readings from Last 1 Encounters:  04/03/20 5\' 2"  (1.575 m)    Weight:   Wt Readings from Last 1 Encounters:  04/03/20 105.6 kg    Ideal Body Weight:  50 kg  BMI:  Body mass index is 42.58 kg/m.  Estimated Nutritional Needs:   Kcal:  2065-2260  Protein:  110-125 gm  Fluid:  1 L + UOP    Gaynell Face, MS, RD, LDN Inpatient Clinical Dietitian Please see [facility] for contact information.

## 2020-04-03 NOTE — Consult Note (Signed)
Chief Complaint: Patient was seen in consultation today for percutaneous gastric tube placement Chief Complaint  Patient presents with  . Fever   at the request of Dr Charlsie Quest   Supervising Physician: Aletta Edouard  Patient Status: [facility] - In-pt  History of Present Illness: Wanda Ramirez is a 49 y.o. female   Hx ESRD: Covid + 02/09/20 Progressed to ARDS-- intubation 9/24 Extubated - but re intubated 10/8 Hypoxemic resp failure Prolonged ventilator  Sacral wound/necrotizing soft tissue infection-- debridement today in OR-- Plan to go back to OR 11/4 for relook  Dysphagia Deconditioning Non healing sacral wound  Request for percutaneous gastric tube for nutrition  Dr Pascal Lux reviewed imaging and approves procedure Temp 100.9 this am Wbc 19.6  Will watch these values over next few days in hopes will normalize   Past Medical History:  Diagnosis Date  . Arthritis   . Asthma   . Chronic back pain   . Diabetes mellitus ([place])   . ESRD (end stage renal disease) on dialysis (HCC)    TTS  . Essential hypertension   . GERD (gastroesophageal reflux disease)   . Glaucoma   . Hyperlipidemia   . Hypothyroidism   . Morbid obesity ([place])   . Peripheral neuropathy   . Sleep apnea    wears CPAP, does not know setting    Past Surgical History:  Procedure Laterality Date  . AV FISTULA PLACEMENT Left 04/2014  . CARDIAC CATHETERIZATION N/A 03/17/2016   Procedure: Left Heart Cath and Coronary Angiography;  Surgeon: [name], MD;  Location: [facility];  Service: Cardiovascular;  Laterality: N/A;  . IR FLUORO GUIDE CV LINE LEFT  02/22/2020  . IR FLUORO GUIDE CV LINE LEFT  02/27/2020  . IR US GUIDE VASC ACCESS LEFT  02/22/2020  . KNEE SURGERY    . TUBAL LIGATION      Allergies: Hydrocodone  Medications: Prior to Admission medications   Medication Sig Start Date End Date Taking? Authorizing Provider  acetaminophen (TYLENOL) 325 MG tablet Take 650 mg  by mouth every 6 (six) hours as needed for mild pain.   Yes [provider]  albuterol (PROVENTIL HFA;VENTOLIN HFA) 108 (90 Base) MCG/ACT inhaler Inhale 2 puffs into the lungs every 6 (six) hours as needed for wheezing or shortness of breath.   Yes [provider]  amLODipine (NORVASC) 10 MG tablet Take 10 mg by mouth at bedtime.   Yes [provider]  aspirin EC 81 MG tablet Take 81 mg by mouth daily.   Yes [provider]  AURYXIA 1 GM 210 MG(Fe) tablet Take 210 mg by mouth 3 (three) times daily with meals.  12/18/17  Yes [provider]  benzonatate (TESSALON) 100 MG capsule Take 100 mg by mouth as needed for cough.    Yes [provider]  Brinzolamide-Brimonidine (SIMBRINZA) 1-0.2 % SUSP Place 2 drops into both eyes 3 (three) times daily.    Yes [provider]  calcium acetate (PHOSLO) 667 MG capsule Take 1,334 mg by mouth 3 (three) times daily. 10/26/17  Yes [provider]  cinacalcet (SENSIPAR) 90 MG tablet Take 90 mg by mouth daily.    Yes [provider]  glimepiride (AMARYL) 2 MG tablet Take 2 mg by mouth daily with supper.   Yes [provider]  glimepiride (AMARYL) 4 MG tablet Take 4 mg by mouth daily with breakfast.   Yes [provider]  insulin regular human CONCENTRATED (HUMULIN  R) 500 UNIT/ML injection Inject 3-6 Units into the skin 2 (two) times daily with a meal. Takes 3 units QAM and 6 units QHS   Yes [provider]  levothyroxine (SYNTHROID, LEVOTHROID) 125 MCG tablet Take 125 mcg by mouth daily before breakfast.   Yes [provider]  liraglutide (VICTOZA) 18 MG/3ML SOPN Inject 1.8 mg into the skin daily.   Yes [provider]  loratadine (CLARITIN) 10 MG tablet Take 10 mg by mouth daily.   Yes [provider]  metoprolol succinate (TOPROL-XL) 50 MG 24 hr tablet Take 50 mg by mouth 2 (two) times daily. Take with or immediately following a meal.     Yes [provider]  Netarsudil Dimesylate (RHOPRESSA) 0.02 % SOLN Place 1 drop into both eyes daily.    Yes [provider]  omeprazole (PRILOSEC) 40 MG capsule Take 40 mg by mouth daily.   Yes [provider]  ondansetron (ZOFRAN) 4 MG tablet Take 4 mg by mouth daily.   Yes [provider]  Oxycodone HCl 10 MG TABS Take 10 mg by mouth 4 (four) times daily as needed for pain. 01/01/16  Yes [provider]  pregabalin (LYRICA) 75 MG capsule Take 1 capsule (75 mg total) by mouth daily. Take an additional 75mg  on Dialysis days 02/09/18  Yes Hongalgi, Lenis Dickinson, MD  tiotropium (SPIRIVA) 18 MCG inhalation capsule Place 18 mcg into inhaler and inhale daily.   Yes [provider]  Travoprost, BAK Free, (TRAVATAN) 0.004 % SOLN ophthalmic solution Place 1 drop into both eyes every morning.   Yes [provider]  famotidine (PEPCID) 40 MG tablet Take 40 mg by mouth daily.    [provider]     Family History  Problem Relation Age of Onset  . Diabetes Mother   . Hyperlipidemia Mother   . Kidney disease Father     Social History   Socioeconomic History  . Marital status: Single    Spouse name: Not on file  . Number of children: Not on file  . Years of education: Not on file  . Highest education level: Not on file  Occupational History  . Occupation: disabled  Tobacco Use  . Smoking status: Former Research scientist (life sciences)  . Smokeless tobacco: Never Used  . Tobacco comment: Smoked x 2 yrs  Vaping Use  . Vaping Use: Never used  Substance and Sexual Activity  . Alcohol use: No    Comment: years ago - has since quit  . Drug use: No  . Sexual activity: Not on file  Other Topics Concern  . Not on file  Social History Narrative   Pt lives alone in 2 story home (has 2 aides that occasionally stay the night)   Has 4 children   11th grade education   Has never been gainfully employed   Social Determinants of Radio broadcast assistant  Strain:   . Difficulty of Paying Living Expenses: Not on file  Food Insecurity:   . Worried About Charity fundraiser in the Last Year: Not on file  . Ran Out of Food in the Last Year: Not on file  Transportation Needs:   . Lack of Transportation (Medical): Not on file  . Lack of Transportation (Non-Medical): Not on file  Physical Activity:   . Days of Exercise per Week: Not on file  . Minutes of Exercise per Session: Not on file  Stress:   . Feeling of Stress : Not on  file  Social Connections:   . Frequency of Communication with Friends and Family: Not on file  . Frequency of Social Gatherings with Friends and Family: Not on file  . Attends Religious Services: Not on file  . Active Member of Clubs or Organizations: Not on file  . Attends Archivist Meetings: Not on file  . Marital Status: Not on file    Review of Systems: A 12 point ROS discussed and pertinent positives are indicated in the HPI above.  All other systems are negative.   Vital Signs: BP (!) 121/44   Pulse (!) 104   Temp (!) 100.9 F (38.3 C) (Axillary)   Resp (!) 35   Ht 5\' 2"  (1.575 m)   Wt 232 lb 12.9 oz (105.6 kg)   SpO2 100%   BMI 42.58 kg/m   Physical Exam Vitals reviewed.  Constitutional:      Comments: Vent/trach  Cardiovascular:     Rate and Rhythm: Normal rate and regular rhythm.  Pulmonary:     Effort: Respiratory distress present.     Breath sounds: Rhonchi present.  Skin:    General: Skin is warm.  Psychiatric:     Comments: Consented for procedure with Dtr [name] via phone     Imaging: CT ABDOMEN WO CONTRAST  Result Date: 03/30/2020 CLINICAL DATA:  49 year old female with history of COVID-19 pneumonia and dysphagia. Evaluation for gastrostomy tube placement. EXAM: CT ABDOMEN WITHOUT CONTRAST TECHNIQUE: Multidetector CT imaging of the abdomen was performed following the standard protocol without IV contrast. COMPARISON:  10/10/2019 FINDINGS: Lower chest: Hemodialysis  catheter tip located within the floor of the right atrium. Scattered ground-glass and consolidative of opacities visualized in the lung bases. Cystic space in the anterior, subpleural right middle lobe with multiple septations favoring complicated pneumatocele. The heart is normal size. No pericardial effusion. Hepatobiliary: The liver is normal in size, contour, and attenuation. No apparent focal lesion. Status post cholecystectomy. No intra or extrahepatic biliary ductal dilation. Pancreas: Unremarkable. No pancreatic ductal dilatation or surrounding inflammatory changes. Spleen: Normal in size without focal abnormality. Adrenals/Urinary Tract: Adrenal glands are unremarkable. Kidneys symmetric in size. Unchanged right upper pole punctate nonobstructive nephrolithiasis. No hydronephrosis. Stomach/Bowel: Weighted tip feeding tube courses through the stomach coils in the pylorus with the tip terminating in the gastric antrum. The stomach demonstrates favorable anatomy for percutaneous gastrostomy tube placement. No distension or bowel wall thickening appreciated throughout the visualized small and large bowel. Vascular/Lymphatic: No intra-abdominal lymphadenopathy. Scattered atherosclerotic calcification of the bilateral common iliac arteries. The aorta is normal in caliber. Other: No ascites.  No abdominal wall hernia. Musculoskeletal: No acute or significant osseous findings. IMPRESSION: 1. No acute intra-abdominal abnormality. Favorable gastric anatomy for percutaneous gastrostomy tube placement. 2. Scattered ground-glass and consolidative pulmonary opacities in the visualized lung bases compatible with sequela of known COVID 19 associated pneumonia. Interval development of complex pneumatocele in the anterior right middle lobe. 3. Unchanged nonobstructive right upper pole punctate nephrolithiasis. Ruthann Cancer, MD [facility] Electronically Signed   By:  Ruthann Cancer MD   On: 03/30/2020 08:33   DG Chest 1 View  Result Date: 03/25/2020 CLINICAL DATA:  Fever. EXAM: CHEST  1 VIEW COMPARISON:  March 25, 2020 (3:39 a.m.) FINDINGS: There is stable tracheostomy tube, nasogastric tube and left-sided venous catheter positioning. The right internal jugular venous catheter seen on the prior study has been removed. Persistent marked severity bilateral multifocal infiltrates are seen. This is unchanged in  severity when compared to the prior exam. There is no evidence of a pleural effusion or pneumothorax. The cardiac silhouette is mildly enlarged. The visualized skeletal structures are unremarkable. IMPRESSION: Persistent marked severity bilateral multifocal infiltrates, unchanged in severity when compared to the prior exam. Electronically Signed   By: Virgina Norfolk M.D.   On: 03/25/2020 23:38   CT ABDOMEN PELVIS W CONTRAST  Result Date: 04/02/2020 CLINICAL DATA:  Sacral pressure wound, concern for enterocutaneous fistula EXAM: CT ABDOMEN AND PELVIS WITH CONTRAST TECHNIQUE: Multidetector CT imaging of the abdomen and pelvis was performed using the standard protocol following bolus administration of intravenous contrast. CONTRAST:  51mL OMNIPAQUE IOHEXOL 300 MG/ML  SOLN COMPARISON:  03/29/2020 FINDINGS: Lower chest: Persistent multifocal bibasilar airspace disease consistent with history of COVID 19. Bullous changes are seen within the right middle lobe. Bibasilar scarring. Enteric catheter extends into the gastric lumen. Central venous catheter tip extends to the level of the IVC, stable. Hepatobiliary: No focal liver abnormality is seen. Status post cholecystectomy. No biliary dilatation. Pancreas: Unremarkable. No pancreatic ductal dilatation or surrounding inflammatory changes. Spleen: Normal in size without focal abnormality. Adrenals/Urinary Tract: Bilateral renal cortical atrophy consistent with end-stage renal disease. Stable punctate calcifications right  kidney. Bladder is decompressed. The adrenals are unremarkable. Stomach/Bowel: No bowel obstruction or ileus. No wall thickening or inflammatory change. No evidence of bowel fistula. Vascular/Lymphatic: Aortic atherosclerosis. No enlarged abdominal or pelvic lymph nodes. Reproductive: Uterus and bilateral adnexa are unremarkable. Other: No free intraperitoneal fluid or free gas. No abdominal wall hernia. Musculoskeletal: Decubitus ulcer overlies the sacrococcygeal junction. There is extensive subcutaneous gas throughout the left gluteal region, involving the left gluteal musculature and subcutaneous fat. Asymmetric enlargement of the left gluteus maximus without intramuscular fluid collection or evidence of abscess. Overall, findings are consistent with myositis and cellulitis. I do not see any bony destruction to suggest osteomyelitis. Reconstructed images demonstrate no additional findings. IMPRESSION: 1. Sacral decubitus ulcer, with extensive subcutaneous and intramuscular gas involving the left gluteal region as above consistent with myositis and cellulitis. No fluid collection or abscess. No evidence of enterocutaneous fistula or osteomyelitis. 2. Multifocal bilateral airspace disease consistent with COVID 19 pneumonia. 3. Support devices as above. Electronically Signed   By: Randa Ngo M.D.   On: 04/02/2020 22:40   DG CHEST PORT 1 VIEW  Result Date: 04/03/2020 CLINICAL DATA:  Central line placement EXAM: PORTABLE CHEST 1 VIEW COMPARISON:  04/02/2020 FINDINGS: Left subclavian central line is been placed with the tip in the upper SVC directed laterally. Dialysis catheter remains in place, unchanged. Diffuse bilateral airspace disease again noted, unchanged. Stable cardiomegaly. No effusions or visible pneumothorax. IMPRESSION: Left subclavian central line in place with the tip directed laterally in the upper SVC. Remainder of support devices are unchanged. Diffuse bilateral airspace disease, unchanged.  Electronically Signed   By: Rolm Baptise M.D.   On: 04/03/2020 01:13   DG Chest Port 1 View  Result Date: 04/02/2020 CLINICAL DATA:  Healthcare associated pneumonia. EXAM: PORTABLE CHEST 1 VIEW COMPARISON:  04/01/2020 FINDINGS: The tracheostomy tube tip is above the carina. Feeding tube unchanged. Left IJ dialysis catheter is noted with tips in the right atrium. Bilateral interstitial and airspace opacities are unchanged from previous exam. IMPRESSION: 1. No change in aeration to the lungs compared with previous exam. 2. Stable support apparatus. Electronically Signed   By: Kerby Moors M.D.   On: 04/02/2020 07:26   DG Chest Port 1 View  Result Date: 04/01/2020 CLINICAL DATA:  Respiratory failure.  Follow-up exam. EXAM: PORTABLE CHEST 1 VIEW COMPARISON:  03/29/2020 and earlier studies. FINDINGS: Bilateral airspace lung opacities are without change from the most recent prior study. No new lung abnormalities. No pneumothorax. Tracheostomy tube, nasogastric tube and dual lumen, tunneled left internal jugular central venous catheter are all stable. IMPRESSION: 1. No change from the most recent prior exam. 2. Persistent bilateral airspace lung opacities. No change in the support apparatus. Electronically Signed   By: Lajean Manes M.D.   On: 04/01/2020 07:06   DG Chest Port 1 View  Result Date: 03/29/2020 CLINICAL DATA:  Respiratory failure. EXAM: PORTABLE CHEST 1 VIEW COMPARISON:  03/27/2020 FINDINGS: 0359 hours. Low lung volumes with diffuse bilateral airspace disease, right greater than left. No pneumothorax or substantial pleural effusion. The cardio pericardial silhouette is enlarged. Tracheostomy tube remains in place. A feeding tube passes into the stomach although the distal tip position is not included on the film. Left IJ central line tip overlies the upper to mid right atrium. IMPRESSION: Low volume film with diffuse bilateral airspace disease, right greater than left. No substantial interval  change. Electronically Signed   By: Misty Stanley M.D.   On: 03/29/2020 06:45   DG CHEST PORT 1 VIEW  Result Date: 03/27/2020 CLINICAL DATA:  Tachypnea EXAM: PORTABLE CHEST 1 VIEW COMPARISON:  03/25/2020, 03/24/2020, 03/19/2020 FINDINGS: Tracheostomy tube similar in position. Left-sided central venous catheter tip over the right atrium. Esophageal tube tip below the diaphragm. Extensive right greater than left pulmonary airspace disease with little change since most recent priors. Stable cardiomediastinal silhouette. No pneumothorax. IMPRESSION: No significant interval change in extensive right greater than left pulmonary airspace disease. Electronically Signed   By: Donavan Foil M.D.   On: 03/27/2020 20:10   DG CHEST PORT 1 VIEW  Result Date: 03/25/2020 CLINICAL DATA:  Pneumonia due to COVID-19 virus. EXAM: PORTABLE CHEST 1 VIEW COMPARISON:  Chest x-rays dated 03/24/2020 and 03/21/2020. FINDINGS: Persistent bilateral airspace opacities, without significant interval change. No pleural effusion or pneumothorax is seen. Support apparatus appears stable in position. IMPRESSION: Bilateral multifocal pneumonia.  No significant interval change. Electronically Signed   By: Franki Cabot M.D.   On: 03/25/2020 06:15   DG CHEST PORT 1 VIEW  Result Date: 03/24/2020 CLINICAL DATA:  Difficulty breathing. EXAM: PORTABLE CHEST 1 VIEW COMPARISON:  March 21, 2020. FINDINGS: The heart size and mediastinal contours are within normal limits. Tracheostomy and feeding tubes are unchanged in position. Bilateral jugular catheters are unchanged. No pneumothorax or pleural effusion is noted. Stable bilateral patchy airspace opacities are noted consistent with multifocal pneumonia. The visualized skeletal structures are unremarkable. IMPRESSION: Stable bilateral patchy airspace opacities are noted consistent with multifocal pneumonia. Electronically Signed   By: Marijo Conception M.D.   On: 03/24/2020 15:11   DG CHEST PORT 1  VIEW  Result Date: 03/21/2020 CLINICAL DATA:  Intubated with aspiration. EXAM: PORTABLE CHEST 1 VIEW COMPARISON:  03/19/2020 FINDINGS: Bilateral central venous catheters, and enteric tube are unchanged in position. Cardiac enlargement. Bilateral perihilar infiltrates. No pleural effusions. No pneumothorax. No significant change since prior study. IMPRESSION: Cardiac enlargement with bilateral perihilar infiltrates, unchanged. Electronically Signed   By: Lucienne Capers M.D.   On: 03/21/2020 04:03   DG Chest Port 1 View  Result Date: 03/19/2020 CLINICAL DATA:  Tracheostomy. EXAM: PORTABLE CHEST 1 VIEW COMPARISON:  One-view chest x-ray 03/19/2020 at 5:13 a.m. FINDINGS: Heart size is normal. Endotracheal tube has been replaced with a tracheostomy tube. The tip  is 0.5 cm from the carina, in satisfactory position. Enteric tube courses off the inferior border the film. Right IJ line is stable. Interstitial and airspace opacities are unchanged. No pneumothorax or pneumomediastinum is present. IMPRESSION: 1. Tracheostomy tube in satisfactory position. 2. Stable diffuse interstitial and airspace disease. Findings are concerning for infection or edema. Electronically Signed   By: San Morelle M.D.   On: 03/19/2020 16:42   DG Chest Port 1 View  Result Date: 03/19/2020 CLINICAL DATA:  Acute respiratory failure with hypoxia EXAM: PORTABLE CHEST 1 VIEW COMPARISON:  03/12/2020 FINDINGS: Endotracheal and enteric tubes, bilateral central venous catheters are unchanged in position. Heart size is normal. Patchy perihilar and basilar infiltrates bilaterally. No significant change since previous study. IMPRESSION: Bilateral perihilar and basilar infiltrates unchanged. Electronically Signed   By: Lucienne Capers M.D.   On: 03/19/2020 06:19   DG CHEST PORT 1 VIEW  Result Date: 03/12/2020 CLINICAL DATA:  COVID positive. EXAM: PORTABLE CHEST 1 VIEW COMPARISON:  03/11/2020 FINDINGS: 0426 hours. Endotracheal tube  tip is 2.7 cm above the base of the carina. A feeding tube passes into the stomach although the distal tip position is not included on the film. Left IJ dialysis catheter tip overlies the upper right atrium. Right IJ central line tip overlies the innominate vein confluence. Apparent new left subclavian catheter/sheath. Diffuse bilateral airspace disease, right greater than left, similar to prior. No pneumothorax or pleural effusion evident on the current exam. Telemetry leads overlie the chest. IMPRESSION: Stable exam. Right greater than left diffuse airspace disease without substantial change. Support apparatus as above. Electronically Signed   By: Misty Stanley M.D.   On: 03/12/2020 06:31   DG CHEST PORT 1 VIEW  Result Date: 03/11/2020 CLINICAL DATA:  COVID-19. EXAM: PORTABLE CHEST 1 VIEW COMPARISON:  Chest radiograph 03/09/2020 FINDINGS: ET tube terminates at the carina, recommend retraction. Enteric tube courses inferior to the diaphragm. Right IJ central venous catheter tip projects over the superior vena cava. Dialysis catheter projects over the right atrium. Stable cardiac and mediastinal contours. Similar diffuse bilateral airspace opacities. No pleural effusion or pneumothorax. IMPRESSION: 1. ET tube terminates at the carina, recommend retraction. 2. Stable bilateral airspace opacities. 3. These results will be called to the ordering clinician or representative by the Radiologist Assistant, and communication documented in the PACS or [employer]. Electronically Signed   By: Lovey Newcomer M.D.   On: 03/11/2020 11:25   DG CHEST PORT 1 VIEW  Result Date: 03/09/2020 CLINICAL DATA:  COVID-19 positivity with ARDS EXAM: PORTABLE CHEST 1 VIEW COMPARISON:  02/27/2020 FINDINGS: Cardiac shadow is stable. Dialysis catheter, endotracheal tube, feeding catheter and right jugular central line are seen. The endotracheal tube lies at the level of the carina and should be withdrawn 2-3 cm. Patchy airspace opacity  is again identified but now in a symmetrical fashion bilaterally consistent with the given clinical history of COVID-19 positivity. No bony abnormality is seen. IMPRESSION: Bilateral airspace opacities consistent with the given clinical history. Endotracheal tube now lies at the level of the carina and should be withdrawn 2-3 cm. These results will be called to the ordering clinician or representative by the Radiologist Assistant, and communication documented in the PACS or [employer]. Electronically Signed   By: Inez Catalina M.D.   On: 03/09/2020 16:45   DG Abd Portable 1V  Result Date: 03/28/2020 CLINICAL DATA:  Leukocytosis, ileus, nausea and vomiting EXAM: PORTABLE ABDOMEN - 1 VIEW COMPARISON:  02/27/2020 FINDINGS: Feeding tube folded in  the pyloric channel with the tip redirected to the stomach body. Nonobstructive bowel gas pattern. Scattered colonic air noted. No significant ileus or dilatation. Remote cholecystectomy. Degenerative changes of the lower lumbar spine. IMPRESSION: Feeding tube folded in the distal stomach. Nonobstructive bowel gas pattern Electronically Signed   By: Jerilynn Mages.  Shick M.D.   On: 03/28/2020 09:47   VAS [place] LOWER EXTREMITY VENOUS (DVT)  Result Date: 03/21/2020  Lower Venous DVTStudy Indications: Recent COVID-19 infection. Edema.  Limitations: Body habitus and poor ultrasound/tissue interface. Comparison Study: No prior study Performing Technologist: Maudry Mayhew MHA, RDMS, RVT, RDCS  Examination Guidelines: A complete evaluation includes B-mode imaging, spectral Doppler, color Doppler, and power Doppler as needed of all accessible portions of each vessel. Bilateral testing is considered an integral part of a complete examination. Limited examinations for reoccurring indications may be performed as noted. The reflux portion of the exam is performed with the patient in reverse Trendelenburg.  +---------+---------------+---------+-----------+----------+--------------+  RIGHT    CompressibilityPhasicitySpontaneityPropertiesThrombus Aging +---------+---------------+---------+-----------+----------+--------------+ CFV      Full           Yes      Yes                                 +---------+---------------+---------+-----------+----------+--------------+ FV Prox  Full                                                        +---------+---------------+---------+-----------+----------+--------------+ FV Mid   Full                                                        +---------+---------------+---------+-----------+----------+--------------+ FV DistalFull                                                        +---------+---------------+---------+-----------+----------+--------------+ POP      Full           Yes      Yes                                 +---------+---------------+---------+-----------+----------+--------------+ PTV      Full                                                        +---------+---------------+---------+-----------+----------+--------------+ PERO     Full                                                        +---------+---------------+---------+-----------+----------+--------------+   Right Technical Findings: Not visualized segments include SFJ, PFV.  +---------+---------------+---------+-----------+----------+--------------+  LEFT     CompressibilityPhasicitySpontaneityPropertiesThrombus Aging +---------+---------------+---------+-----------+----------+--------------+ CFV      Full           Yes      Yes                                 +---------+---------------+---------+-----------+----------+--------------+ FV Prox  Full                                                        +---------+---------------+---------+-----------+----------+--------------+ FV Mid   Full                                                         +---------+---------------+---------+-----------+----------+--------------+ FV DistalFull                                                        +---------+---------------+---------+-----------+----------+--------------+ POP      Full           Yes      Yes                                 +---------+---------------+---------+-----------+----------+--------------+ PTV      Full                                                        +---------+---------------+---------+-----------+----------+--------------+ PERO     Full                                                        +---------+---------------+---------+-----------+----------+--------------+   Left Technical Findings: Not visualized segments include SFJ, PFV.   Summary: RIGHT: - There is no evidence of deep vein thrombosis in the lower extremity. However, portions of this examination were limited- see technologist comments above.  - No cystic structure found in the popliteal fossa.  LEFT: - There is no evidence of deep vein thrombosis in the lower extremity. However, portions of this examination were limited- see technologist comments above.  - No cystic structure found in the popliteal fossa.  *See table(s) above for measurements and observations. Electronically signed by Harold Barban MD on 03/21/2020 at 7:39:10 PM.    Final     Labs:  CBC: Recent Labs    03/31/20 0119 03/31/20 0119 04/01/20 0320 04/01/20 0320 04/02/20 0355 04/02/20 1825 04/03/20 0220 04/03/20 0907  WBC 23.6*  --  20.1*  --  26.6*  --  19.6*  --   HGB 7.8*   < > 7.6*   < > 6.6* 7.0* 7.3* 9.2*  HCT 25.1*   < >  24.2*   < > 21.0* 21.9* 23.0* 27.0*  PLT 419*  --  418*  --  359  --  294  --    < > = values in this interval not displayed.    COAGS: Recent Labs    02/14/20 1216 02/26/20 0709 02/27/20 0514 03/03/20 1700 03/04/20 0356 03/04/20 1605 03/05/20 0450 04/02/20 1825 04/03/20 0245  INR 1.2 1.3*  --   --   --   --   --  1.2 1.4*   APTT  --  31   < > 73* 52* 62* 47*  --   --    < > = values in this interval not displayed.    BMP: Recent Labs    03/04/20 0356 03/04/20 0927 03/04/20 1605 03/04/20 1605 03/05/20 0450 03/05/20 0450 03/05/20 1703 03/06/20 1713 03/31/20 0119 03/31/20 0119 04/01/20 0320 04/02/20 0355 04/03/20 0220 04/03/20 0907  NA 136   < > 136   < > 135   < > 134*   < > 132*   < > 133* 130* 132* 133*  K 3.8   < > 3.6   < > 4.0   < > 4.6   < > 5.2*   < > 4.3 5.1 4.4 5.0  CL 101  --  103   < > 102   < > 102   < > 92*  --  93* 92* 93*  --   CO2 23  --  25   < > 24   < > 22   < > 24  --  27 23 25   --   GLUCOSE 160*  --  223*   < > 203*   < > 258*   < > 156*  --  255* 241* 163*  --   BUN 29*  --  28*   < > 27*   < > 31*   < > 75*  --  42* 80* 40*  --   CALCIUM 8.6*  --  8.5*   < > 8.9   < > 8.6*   < > 10.6*  --  10.2 10.5* 9.2  --   CREATININE 1.90*  --  2.00*   < > 1.82*   < > 2.00*   < > 5.40*  --  3.05* 4.62* 2.58*  --   GFRNONAA 30*  --  29*   < > 32*   < > 29*   < > 9*  --  18* 11* 22*  --   GFRAA 35*  --  33*  --  37*  --  33*  --   --   --   --   --   --   --    < > = values in this interval not displayed.    LIVER FUNCTION TESTS: Recent Labs    03/20/20 0419 03/20/20 0419 03/22/20 0423 03/23/20 0455 03/24/20 1120 03/27/20 0623  BILITOT 0.6  --  0.6 0.5 0.7  --   AST 50*  --  56* 38 35  --   ALT 75*  --  67* 57* 48*  --   ALKPHOS 479*  --  502* 415* 420*  --   PROT 6.3*  --  6.3* 6.2* 6.8  --   ALBUMIN 1.3*   < > 1.5* 1.6* 1.7* 1.6*   < > = values in this interval not displayed.    TUMOR MARKERS: No results for input(s): AFPTM, CEA, CA199, CHROMGRNA in  the last 8760 hours.  Assessment and Plan:  Covid 02/09/20 ARDS Hypoxemic respiratory failure Dysphagia; deconditioning Vent/trach Scheduled for percutaneous gastric tube placement when appropriate Will keep check on temp (100.9 this am) Will watch wbc (19.6 this am) Will need to see these values trend down to be able  to move ahead with Perc G tube in IR Risks and benefits image guided gastrostomy tube placement was discussed with the patient's daughter [name] via phone including, but not limited to the need for a barium enema during the procedure, bleeding, infection, peritonitis and/or damage to adjacent structures.  All questions were answered, she is agreeable to proceed. Consent signed and in chart.   Thank you for this interesting consult.  I greatly enjoyed meeting Wanda Ramirez and look forward to participating in their care.  A copy of this report was sent to the requesting provider on this date.  Electronically Signed: Lavonia Drafts, PA-C 04/03/2020, 9:58 AM   I spent a total of 40 Minutes    in face to face in clinical consultation, greater than 50% of which was counseling/coordinating care for percutaneous gastric tube placement

## 2020-04-03 NOTE — Progress Notes (Signed)
eMD notified of Lactic acid 2.1, Hgb 7.3

## 2020-04-03 NOTE — Progress Notes (Signed)
Critical ABG values given to P. Heber [place], NP.

## 2020-04-04 ENCOUNTER — Encounter (HOSPITAL_COMMUNITY): Payer: Self-pay | Admitting: Surgery

## 2020-04-04 DIAGNOSIS — J80 Acute respiratory distress syndrome: Secondary | ICD-10-CM | POA: Diagnosis not present

## 2020-04-04 DIAGNOSIS — J9601 Acute respiratory failure with hypoxia: Secondary | ICD-10-CM | POA: Diagnosis not present

## 2020-04-04 DIAGNOSIS — U071 COVID-19: Secondary | ICD-10-CM | POA: Diagnosis not present

## 2020-04-04 DIAGNOSIS — L03317 Cellulitis of buttock: Secondary | ICD-10-CM | POA: Diagnosis not present

## 2020-04-04 LAB — COMPREHENSIVE METABOLIC PANEL
ALT: 20 U/L (ref 0–44)
AST: 62 U/L — ABNORMAL HIGH (ref 15–41)
Albumin: 1.4 g/dL — ABNORMAL LOW (ref 3.5–5.0)
Alkaline Phosphatase: 202 U/L — ABNORMAL HIGH (ref 38–126)
Anion gap: 14 (ref 5–15)
BUN: 68 mg/dL — ABNORMAL HIGH (ref 6–20)
CO2: 22 mmol/L (ref 22–32)
Calcium: 9.6 mg/dL (ref 8.9–10.3)
Chloride: 100 mmol/L (ref 98–111)
Creatinine, Ser: 3.9 mg/dL — ABNORMAL HIGH (ref 0.44–1.00)
GFR, Estimated: 13 mL/min — ABNORMAL LOW (ref >60)
Glucose, Bld: 167 mg/dL — ABNORMAL HIGH (ref 70–99)
Potassium: 4.9 mmol/L (ref 3.5–5.1)
Sodium: 136 mmol/L (ref 135–145)
Total Bilirubin: 4 mg/dL — ABNORMAL HIGH (ref 0.3–1.2)
Total Protein: 5.8 g/dL — ABNORMAL LOW (ref 6.5–8.1)

## 2020-04-04 LAB — TYPE AND SCREEN
ABO/RH(D): O POS
Antibody Screen: NEGATIVE
Unit division: 0
Unit division: 0
Unit division: 0
Unit division: 0
Unit division: 0
Unit division: 0
Unit division: 0

## 2020-04-04 LAB — CBC
HCT: 22.9 % — ABNORMAL LOW (ref 36.0–46.0)
Hemoglobin: 7.7 g/dL — ABNORMAL LOW (ref 12.0–15.0)
MCH: 28.7 pg (ref 26.0–34.0)
MCHC: 33.6 g/dL (ref 30.0–36.0)
MCV: 85.4 fL (ref 80.0–100.0)
Platelets: 208 10*3/uL (ref 150–400)
RBC: 2.68 MIL/uL — ABNORMAL LOW (ref 3.87–5.11)
RDW: 17.4 % — ABNORMAL HIGH (ref 11.5–15.5)
WBC: 27.7 10*3/uL — ABNORMAL HIGH (ref 4.0–10.5)
nRBC: 0.2 % (ref 0.0–0.2)

## 2020-04-04 LAB — GLUCOSE, CAPILLARY
Glucose-Capillary: 168 mg/dL — ABNORMAL HIGH (ref 70–99)
Glucose-Capillary: 84 mg/dL (ref 70–99)
Glucose-Capillary: 120 mg/dL — ABNORMAL HIGH (ref 70–99)
Glucose-Capillary: 162 mg/dL — ABNORMAL HIGH (ref 70–99)
Glucose-Capillary: 158 mg/dL — ABNORMAL HIGH (ref 70–99)
Glucose-Capillary: 141 mg/dL — ABNORMAL HIGH (ref 70–99)

## 2020-04-04 LAB — BPAM RBC
Blood Product Expiration Date: 202111262359
Blood Product Expiration Date: 202111272359
Blood Product Expiration Date: 202111272359
Blood Product Expiration Date: 202111292359
Blood Product Expiration Date: 202111292359
Blood Product Expiration Date: 202112042359
Blood Product Expiration Date: 202112042359
ISSUE DATE / TIME: 202111011330
ISSUE DATE / TIME: 202111011941
ISSUE DATE / TIME: 202111011941
ISSUE DATE / TIME: 202111020517
ISSUE DATE / TIME: 202111020517
ISSUE DATE / TIME: 202111020725
ISSUE DATE / TIME: 202111020725
Unit Type and Rh: 5100
Unit Type and Rh: 5100
Unit Type and Rh: 5100
Unit Type and Rh: 5100
Unit Type and Rh: 5100
Unit Type and Rh: 5100
Unit Type and Rh: 5100

## 2020-04-04 LAB — PHOSPHORUS: Phosphorus: 5.7 mg/dL — ABNORMAL HIGH (ref 2.5–4.6)

## 2020-04-04 LAB — MAGNESIUM: Magnesium: 2.3 mg/dL (ref 1.7–2.4)

## 2020-04-04 MED ORDER — HEPARIN SODIUM (PORCINE) 1000 UNIT/ML IJ SOLN
INTRAMUSCULAR | Status: AC
  Administered 2020-04-04: 2000 [IU] via INTRAVENOUS_CENTRAL
  Filled 2020-04-04: qty 2

## 2020-04-04 MED ORDER — VANCOMYCIN HCL IN DEXTROSE 1-5 GM/200ML-% IV SOLN
1000.0000 mg | INTRAVENOUS | Status: AC
  Administered 2020-04-04: 1000 mg via INTRAVENOUS
  Filled 2020-04-04: qty 200

## 2020-04-04 MED ORDER — VITAL 1.5 CAL PO LIQD
1000.0000 mL | ORAL | Status: DC
  Administered 2020-04-04 – 2020-04-05 (×2): 1000 mL
  Filled 2020-04-04: qty 1000

## 2020-04-04 NOTE — Progress Notes (Signed)
NAME:  Wanda Ramirez, MRN:  [id number], DOB:  [birth year], LOS: 26 ADMISSION DATE:  02/13/2020, CONSULTATION DATE:  9/24 REFERRING MD:  Heber Roann, CHIEF COMPLAINT:  Dyspnea   Brief History   49 yo female admitted with COVID 19 pneumonia on 9/13, had been diagnosed with COVID on 9/9.  On 9/24 her condition worsened and PCCM was consulted, she was sent to the ICU and treated with BIPAP, then eventually intubated.  Started on CRRT through a tunneled HD cath. Extubated on 10/6 but re-intubated on 10/8.  Has been challenged by sedation needs since admission.   Past Medical History  Sleep apnea Morbid obesity Hypothyroidism Hypertension GERD  End-stage renal disease on HD MWF Diabetes Asthma  Significant Hospital Events   9/14 admit 9/25 on BiPAP with Precedex overnight 9/26 -unable to run CRRT with prone ventilation.  Resume supine ventilation but still unable to draw blood back. 9/27 transferred to 80m. IR replaced HD catheter 10/1 on CRRT and vasopressors 10/6 - extubated 10/7 - had some stridor and increased WOB so placed on BIPAP and dexamethasone given.  10/15 weaning sedation. midaz off 10/16 move to 86M 10/18 s/p 6 proximal XLT Shiley tracheostomy  10/19: Resumed Eliquis low dose. Per Dr. Thompson Caul note 10/7 will need to be on [facility] for 1 month then reasses 10/19: Episode of hypoglycemia 55. 10/21 hypoglycemic again. levemir and tubefeed coverage decreased. Low grade temp->sputum culture send had vomiting event on 10/19 w/ concern for aspiration. Fent was stopped 10/20. Still gets anxious at night. Required increased precedex. Added clonazepam. 10/22 attempting PSV trials  10/25 precedex drip weaned off 10/27 precedex restarted 10/28 Klonopin and seroquel added to attempt to get back off precedex gtt 10/31 started on vanco due to staph sputum  Consults:  Nephrology  Procedures:  9/23 tunneled HD cath placement >  10/8 ETT > 10/18 10/18 6 proximal XLT Shiley tracheostomy   >  Significant Diagnostic Tests:  9/21 Vas ultrasound> thrombosed LUE fistula 9/21 VQ scan > negative for PE 9/29 echo> LVEF 60-65%, flat ventrcile consistent with RV pressure overload, RV severely enlarged, moderate reduction in RV function  Micro Data:  9/13 blood > neg   9/14 sars cov 2 > pos 9/26 blood > neg 10/8 resp > MSSA 10/8 blood > negative  10/21 sputum > Normal flora  10/25 Blood > ngtd x 3 days 10/25 Sputum  > few diphtheroids  10/25 Blood >>negative 10/29 Sputum >> Staph aureus  Antimicrobials:  9/14 remdesivir > 9/19 9/15 tocilizumab > x1 9/20 cefepime > 9/29 9/20 azithro >  9/25 10/8 vanc > 10/9 10/8 zosyn > x1 10/10 ceftriaxone > 10/16 10/31 vancomycin >>   Interim history/subjective:  Patient is seen lying in bed on iHD  T-max of 102.4 overnight  Hgb drifted down to 7.7 overnight   Objective   Blood pressure (!) 114/55, pulse (!) 103, temperature 99.2 F (37.3 C), temperature source Axillary, resp. rate (!) 22, height 5\' 2"  (1.575 m), weight 108.8 kg, SpO2 100 %.    Vent Mode: PRVC FiO2 (%):  [40 %-60 %] 40 % Set Rate:  [35 bmp] 35 bmp Vt Set:  [400 mL] 400 mL PEEP:  [5 cmH20] 5 cmH20 Plateau Pressure:  [23 cmH20-24 cmH20] 24 cmH20   Intake/Output Summary (Last 24 hours) at 04/04/2020 0907 Last data filed at 04/04/2020 0800 Gross per 24 hour  Intake 3005.63 ml  Output 150 ml  Net 2855.63 ml   Filed Weights   04/03/20 0400  04/04/20 0400 04/04/20 0710  Weight: 105.6 kg 108.8 kg 108.8 kg   Examination: General: Chronically ill appearing elderly deconditioned female lying in bed on mechanical ventilation through trach, in NAD HEENT: 6cuffed proximal XLT trach in place, MM pink/moist, PERRL,  Neuro: Will open eyes to verbal stimuli, unable to follow commands  CV: s1s2 regular rate and rhythm, no murmur, rubs, or gallops,  PULM:  Bilateral rhonchi, expiratory wheeze to left upper, tolerating vent, no increased work of breathing  GI: soft, bowel  sounds active in all 4 quadrants, non-tender, non-distended, tolerating TF Extremities: warm/dry, no edema  Skin: Sacral wound with dressing in pace   Resolved Hospital Problem list   Thrombocytopenia  MSSA PNA  Nausea   Assessment & Plan:   Acute hypoxic respiratory failure from COVID 19 pneumonia with ARDS complicated by MSSA PNA. Failure to wean s/p tracheostomy. - Transition to DNR, family planning to change to comfort care once family gets in from [place].  P: Continue ventilator support with lung protective strategies  Wean PEEP and FiO2 for sats greater than 90%. Head of bed elevated 30 degrees. Plateau pressures less than 30 cm H20.  Follow intermittent chest x-ray and ABG.   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   -MSSA P: Treatment as above  Pulmonary hygiene   Acute metabolic encephalopathy in setting of sepsis. Agitated delirium. P: RASS goal 0 Continue Klonipin, Oxycodone, and Seroquel   Anemia  -r/t to blood loss from sacrum and anemia of critical illness. S/p 4 units PRBC P: Trend CBC  Transfuse for hgb > 7 Wound care   Hypothyroidism P: Continue Synthroid   DM type 2 with labile blood sugars. P: Continue SSI, Lantus, and tube feed coverage  CBG checks  q4  Dysphagia. - IR has seen with plans for PEG this week, but with GOC plans changing it will likely not be necessary. Continue with cortrak.   Pressure ulcer -Unstageable sacral ulcer stage II right neck presure injury  P: Wound care Pressure alleviating devices Frequent turn Ensure adequate nutrition   GOC Family discussion 11/2. Dr. Jonnie Finner and Dr. Bobbye Morton present during this conversation family elects for DNR and transition to comfort care.  However on further discussion 11/3 with both daughters identifying family has decided to pursue current aggressive measures and would like additional operative intervention if indicated.  Will discuss with general surgery regarding surgical plans.  Patient continues to remain DO NOT RESUSCITATE.  Best practice:  Diet: tube feeding DVT prophylaxis: Heparin [place] GI prophylaxis: Protonix Mobility: Bed rest Code Status: DNR Family discussion: Daughters updated over the phone on 11/3 Disposition: ICU  Labs:   CMP Latest Ref Rng & Units 04/04/2020 04/03/2020 04/03/2020  Glucose 70 - 99 mg/dL 167(H) - 130(H)  BUN 6 - 20 mg/dL 68(H) - 47(H)  Creatinine 0.44 - 1.00 mg/dL 3.90(H) - 2.87(H)  Sodium 135 - 145  mmol/L 136 133(L) 133(L)  Potassium 3.5 - 5.1 mmol/L 4.9 4.8 4.8  Chloride 98 - 111 mmol/L 100 - 97(L)  CO2 22 - 32 mmol/L 22 - 24  Calcium 8.9 - 10.3 mg/dL 9.6 - 8.7(L)  Total Protein 6.5 - 8.1 g/dL 5.8(L) - 5.5(L)  Total Bilirubin 0.3 - 1.2 mg/dL 4.0(H) - 3.8(H)  Alkaline Phos 38 - 126 U/L 202(H) - 229(H)  AST 15 - 41 U/L 62(H) - 60(H)  ALT 0 - 44 U/L 20 - 18    CBC Latest Ref Rng & Units 04/04/2020 04/03/2020 04/03/2020  WBC 4.0 - 10.5 K/uL 27.7(H) 23.0(H) -  Hemoglobin 12.0 - 15.0 g/dL 7.7(L) 8.0(L) 9.2(L)  Hematocrit 36 - 46 % 22.9(L) 24.3(L) 27.0(L)  Platelets 150 - 400 K/uL 208 222 -    ABG    Component Value Date/Time   PHART 7.250 (L) 04/03/2020 1030   PCO2ART 64.8 (H) 04/03/2020 1030   PO2ART 156 (H) 04/03/2020 1030   HCO3  28.4 (H) 04/03/2020 1030   TCO2 30 04/03/2020 1030   ACIDBASEDEF 2.0 04/03/2020 0907   O2SAT 99.0 04/03/2020 1030    CBG (last 3)  Recent Labs    04/03/20 2326 04/04/20 0339 04/04/20 0800  GLUCAP 248* 168* 84    Critical care time:   CRITICAL CARE Performed by: Johnsie Cancel  Total critical care time: 40 minutes  Critical care time was exclusive of separately billable procedures and treating other patients.  Critical care was necessary to treat or prevent imminent or life-threatening deterioration.  Critical care was time spent personally by me on the following activities: development of treatment plan with patient and/or surrogate as well as nursing, discussions with consultants, evaluation of patient's response to treatment, examination of patient, obtaining history from patient or surrogate, ordering and performing treatments and interventions, ordering and review of laboratory studies, ordering and review of radiographic studies, pulse oximetry and re-evaluation of patient's condition.  Johnsie Cancel, NP-C [facility] Contact / Pager information can be found on Amion  04/04/2020, 10:31 AM

## 2020-04-04 NOTE — Progress Notes (Signed)
Nutrition Follow-up  DOCUMENTATION CODES:   Morbid obesity  INTERVENTION:   Trickle tube feeds via Cortrak: - Vital 1.5 @ 20 ml/hr (480 ml/day)  Trickle tube feeding regimen provides 720 kcal, 32 grams of protein, and 367 ml of H2O (meeting 35% of kcal needs, 29% of protein needs).  RD will monitor for ability to advance to goal tube feeding regimen: - Vital 1.5 @ 55 ml/hr (1320 ml/day) - ProSource TF 45 ml TID  Tube feeding regimen provides 2100 kcal, 122 grams of protein, and 1008 ml of H2O.  NUTRITION DIAGNOSIS:   Inadequate oral intake related to inability to eat as evidenced by NPO status.  Ongoing  GOAL:   Patient will meet greater than or equal to 90% of their needs  Unmet at this time  MONITOR:   Vent status, Labs, Weight trends, TF tolerance, Skin, I & O's  REASON FOR ASSESSMENT:   Consult Assessment of nutrition requirement/status  ASSESSMENT:   49 year old female with a past medical history significant for end-stage renal disease on hemodialysis MWF, chronic hypoxic respiratory failure on 3 L nasal cannula at baseline, sleep apnea, diabetes, hypertension, hyperlipidemia, hypothyroidism, and asthma presents with complaints of worsening shortness of breath. Previously diagnosed with COVID PNA on 9/9.  09/28 - start CRRT 10/04 - cortrak placed; tip gastric 10/07 - end CRRT 10/08 - pt reintubated 10/14 - s/p iHD 10/18 - trach 11/02 - s/p I&D of sacrum, left gluteus  Discussed pt with RN and during ICU rounds. Pt with devastating necrotizing soft tissue infection. Per notes, family desires full aggressive care and would like additional operative intervention if indicated. Tube feeds have been off since 11/01. Discussed with MD who agreed with restarting trickle feeds today. CCM to discuss with Surgery whether pt can go to OR for additional surgery. IR has been consulted for G-tube placement. Watching fevers and WBC.  Pt received HD today with 1000 ml net  UF.  EDW: 111.5 kg Current weight:107.5 kg  Patient remains on ventilator support via trach. MV: 13.9 L/min Temp (24hrs), Avg:99.8 F (37.7 C), Min:98.7 F (37.1 C), Max:102.1 F (38.9 C) BP (a-line): 130/64 MAP (a-line): 84  Drips: Fentanyl Levophed Vasopressin  Medications reviewed and include: B-complex with vitamin C, aranesp, nutrisource fiber BID, SSI q 4 hours, Novolog 5 units q 4 hours, protonix, miralax, IV abx  Labs reviewed: phosphorus 5.7, hemoglobin 7.7 CBG's: 84-248 x 24 hours  Stool: 150 ml x 24 hours I/O's: +18.8 L since admit  Diet Order:   Diet Order    None      EDUCATION NEEDS:   Not appropriate for education at this time  Skin:  Skin Assessment: Skin Integrity Issues: Stage II: right neck Unstageable: mid sacrum Incisions: buttocks  Last BM:  04/04/20 rectal tube  Height:   Ht Readings from Last 1 Encounters:  04/03/20 5\' 2"  (1.575 m)    Weight:   Wt Readings from Last 1 Encounters:  04/04/20 107.5 kg    Ideal Body Weight:  50 kg  BMI:  Body mass index is 43.35 kg/m.  Estimated Nutritional Needs:   Kcal:  2065-2260  Protein:  110-125 gm  Fluid:  1 L + UOP    Gaynell Face, MS, RD, LDN Inpatient Clinical Dietitian Please see AMiON for contact information.

## 2020-04-04 NOTE — Progress Notes (Signed)
I called and spoke with the patient's daughter, Corby Villasenor. She states that she and family have changed their minds and would like everything done for the mother in attempts to keep her alive. The do now want to pursue further surgery if recommended. We discussed that their mother is very high risk for surgery and that surgery may not change eventual outcome of dying. They understand and wish to proceed. We will plan to take her back to surgery on 04/06/20 for debridement.  Wellington Hampshire, [facility] 04/04/2020, 5:00 PM Please see Amion for pager number during day hours 7:00am-4:30pm

## 2020-04-04 NOTE — Progress Notes (Signed)
[facility] Progress Note   OP HD:TTS 4h 8mn 450/800 2/2.25 bath 111.5kg Hep 2000 L AVF - darbe 50 ug q week, last 9/7 - calc 1.0 tiw - 9/9 Hb 10.0, tsat 22%  49yo female w/ ESRD on TTS HD admitted for COVID PNA on 02/13/20. She was intubated. SP CRRT around 9/26- 10/7. Now is on intermittent HD. She is sp trach. She had MSSA pna. DM2.  Assessment/ Plan:   1. COVID pna /sp trach -trying to wean from vent, not having success 2. Septic shock - on 2 pressors at high dose 3. Sacral decub w/ necrosis - sp I&D 11/2 by gen surg, broad spec abx IV 4. SP MSSA HCAP/ VAP 5. ESRD -usual HD TTS.sp CRRT 9/26- 10/7. - HD today ongoing in ICU  4. BP/ volume -euvolemic on exam, minimal edema, 4-7kg under 5. HD access: LUA AVF clotted while here. TDC x2 here by IR.LIJ TC 6. 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 Phos 3.9 10/30 8. DM2 - per pmd 9. [place] y[place], family is coming from out of town and will consider transition to comfort care  RKelly Splinter MD 04/04/2020, 1:58 PM      Subjective:   Seen in ICU, asleep   Objective:   BP (!) 123/59   Pulse (!) 110   Temp 98.7 F (37.1 C) (Axillary)   Resp (!) 38   Ht _0  (1.575 m)   Wt 107.5 kg   SpO2 100%   BMI 43.35 kg/m   Intake/Output Summary (Last 24 hours) at 04/04/2020 1358 Last data filed at 04/04/2020 1039 Gross per 24 hour  Intake 1603.83 ml  Output 1150 ml  Net 453.83 ml   Weight change: 1.3 kg  Physical Exam: More alert, NG in , +trach collar CVS-tachy ABD- BS present soft non-distended  EXT- no edema LE's, lt arm access thrombosed ACCESS: [facility]J TC  Imaging: CT ABDOMEN PELVIS W CONTRAST  Result Date: 04/02/2020 CLINICAL DATA:  Sacral pressure wound, concern for enterocutaneous fistula EXAM: CT ABDOMEN AND PELVIS WITH CONTRAST TECHNIQUE: Multidetector CT imaging of the abdomen  and pelvis was performed using the standard protocol following bolus administration of intravenous contrast. CONTRAST:  861mOMNIPAQUE IOHEXOL 300 MG/ML  SOLN COMPARISON:  03/29/2020 FINDINGS: Lower chest: Persistent multifocal bibasilar airspace disease consistent with history of COVID 19. Bullous changes are seen within the right middle lobe. Bibasilar scarring. Enteric catheter extends into the gastric lumen. Central venous catheter tip extends to the level of the IVC, stable. Hepatobiliary: No focal liver abnormality is seen. Status post cholecystectomy. No biliary dilatation. Pancreas: Unremarkable. No pancreatic ductal dilatation or surrounding inflammatory changes. Spleen: Normal in size without focal abnormality. Adrenals/Urinary Tract: Bilateral renal cortical atrophy consistent with end-stage renal disease. Stable punctate calcifications right kidney. Bladder is decompressed. The adrenals are unremarkable. Stomach/Bowel: No bowel obstruction or ileus. No wall thickening or inflammatory change. No evidence of bowel fistula. Vascular/Lymphatic: Aortic atherosclerosis. No enlarged abdominal or pelvic lymph nodes. Reproductive: Uterus and bilateral adnexa are unremarkable. Other: No free intraperitoneal fluid or free gas. No abdominal wall hernia. Musculoskeletal: Decubitus ulcer overlies the sacrococcygeal junction. There is extensive subcutaneous gas throughout the left gluteal region, involving the left gluteal musculature and subcutaneous fat. Asymmetric enlargement of the left gluteus maximus without intramuscular fluid collection or evidence of abscess. Overall, findings are consistent with myositis and  cellulitis. I do not see any bony destruction to suggest osteomyelitis. Reconstructed images demonstrate no additional findings. IMPRESSION: 1. Sacral decubitus ulcer, with extensive subcutaneous and intramuscular gas involving the left gluteal region as above consistent with myositis and cellulitis. No  fluid collection or abscess. No evidence of enterocutaneous fistula or osteomyelitis. 2. Multifocal bilateral airspace disease consistent with COVID 19 pneumonia. 3. Support devices as above. Electronically Signed   By: Randa Ngo M.D.   On: 04/02/2020 22:40   DG CHEST PORT 1 VIEW  Result Date: 04/03/2020 CLINICAL DATA:  Central line placement EXAM: PORTABLE CHEST 1 VIEW COMPARISON:  04/02/2020 FINDINGS: Left subclavian central line is been placed with the tip in the upper SVC directed laterally. Dialysis catheter remains in place, unchanged. Diffuse bilateral airspace disease again noted, unchanged. Stable cardiomegaly. No effusions or visible pneumothorax. IMPRESSION: Left subclavian central line in place with the tip directed laterally in the upper SVC. Remainder of support devices are unchanged. Diffuse bilateral airspace disease, unchanged. Electronically Signed   By: Rolm Baptise M.D.   On: 04/03/2020 01:13    Labs: [employer] Recent Labs  Lab 03/29/20 0245 03/29/20 0245 03/30/20 0139 03/30/20 0139 03/31/20 0119 03/31/20 0119 04/01/20 0320 04/01/20 0320 04/02/20 0355 04/02/20 0355 04/03/20 0220 04/03/20 0220 04/03/20 9326 04/03/20 0745 04/03/20 0821 04/03/20 0907 04/03/20 1023 04/03/20 1030 04/04/20 0423  NA 134*   < > 134*   < > 132*   < > 133*   < > 130*   < > 132*   < > 134* 133* 134* 133* 133* 133* 136  K 3.5   < > 4.9   < > 5.2*   < > 4.3   < > 5.1   < > 4.4   < > 4.7 5.1 5.2* 5.0 4.8 4.8 4.9  CL 95*   < > 94*  --  92*  --  93*  --  92*  --  93*  --   --   --   --   --  97*  --  100  CO2 24   < > 25  --  24  --  27  --  23  --  25  --   --   --   --   --  24  --  22  GLUCOSE 94   < > 182*  --  156*  --  255*  --  241*  --  163*  --   --   --   --   --  130*  --  167*  BUN 58*   < > 44*  --  75*  --  42*  --  80*  --  40*  --   --   --   --   --  47*  --  68*  CREATININE 6.26*   < > 3.98*  --  5.40*  --  3.05*  --  4.62*  --  2.58*  --   --   --   --   --  2.87*  --  3.90*   CALCIUM 10.4*   < > 9.7  --  10.6*  --  10.2  --  10.5*  --  9.2  --   --   --   --   --  8.7*  --  9.6  PHOS 5.2*  --  3.2  --  3.9  --   --   --   --   --  3.2  --   --   --   --   --   --   --  5.7*   < > = values in this interval not displayed.   CBC Recent Labs  Lab 04/03/20 0220 04/03/20 0652 04/03/20 1023 04/03/20 1030 04/03/20 1919 04/04/20 0423  WBC 19.6*  --  20.4*  --  23.0* 27.7*  HGB 7.3*   < > 9.1* 9.2* 8.0* 7.7*  HCT 23.0*   < > 28.2* 27.0* 24.3* 22.9*  MCV 90.6  --  90.1  --  86.5 85.4  PLT 294  --  234  --  222 208   < > = values in this interval not displayed.    Medications:    . sodium chloride   Intravenous Once  . sodium chloride   Intravenous Once  . B-complex with vitamin C  1 tablet Per Tube Daily  . chlorhexidine gluconate (MEDLINE KIT)  15 mL Mouth Rinse BID  . Chlorhexidine Gluconate Cloth  6 each Topical Q0600  . clonazepam  0.5 mg Per Tube TID  . collagenase   Topical Daily  . darbepoetin (ARANESP) injection - DIALYSIS  150 mcg Intravenous Q Tue-HD  . fentaNYL (SUBLIMAZE) injection  25 mcg Intravenous Once  . fiber  1 packet Per Tube BID  . hydrocerin   Topical Daily  . insulin aspart  0-20 Units Subcutaneous Q4H  . insulin aspart  5 Units Subcutaneous Q4H  . levothyroxine  125 mcg Per Tube Q0600  . mouth rinse  15 mL Mouth Rinse 10 times per day  . midodrine  10 mg Oral TID WC  . oxyCODONE  10 mg Oral Q6H  . pantoprazole sodium  40 mg Per Tube BID  . polyethylene glycol  17 g Oral Daily  . QUEtiapine  100 mg Per Tube BID  . sodium chloride flush  10-40 mL Intracatheter Q12H  . sodium chloride flush  3 mL Intravenous Q12H  . vancomycin variable dose per unstable renal function (pharmacist dosing)   Does not apply See admin instructions

## 2020-04-04 NOTE — Progress Notes (Addendum)
Pharmacy Antibiotic Note  Wanda Ramirez is a 49 y.o. female admitted on 02/13/2020 with necrotizing wound infection and MSSA pneumonia.  Pharmacy has been consulted for vancomycin and zosyn dosing.   11/2 Wound culture with few gram negative rods, still pending.  11/2 Repeat trach aspirate - pending.  HD today and tolerating - will give Vancomycin after HD today. WBC remains elevated at 27.7, Tmax 100.9 - post OR debridement on 11/1, may go for another debridement.   Plan: Give Vancomycin 1 gram post HD today.  No scheduled vancomycin at this time - follow up HD schedule Continue Zosyn 2.25 gm IV every 8 hours F/u cultures and clinical course  Height: 5\' 2"  (157.5 cm) Weight: 107.5 kg (236 lb 15.9 oz) IBW/kg (Calculated) : 50.1  Temp (24hrs), Avg:99.7 F (37.6 C), Min:98.7 F (37.1 C), Max:102.1 F (38.9 C)  Recent Labs  Lab 04/01/20 0320 04/01/20 0320 04/02/20 0355 04/03/20 0220 04/03/20 1023 04/03/20 1919 04/04/20 0423  WBC 20.1*   < > 26.6* 19.6* 20.4* 23.0* 27.7*  CREATININE 3.05*  --  4.62* 2.58* 2.87*  --  3.90*  LATICACIDVEN  --   --   --  2.1*  --   --   --    < > = values in this interval not displayed.    Estimated Creatinine Clearance: 20.1 mL/min (A) (by C-G formula based on SCr of 3.9 mg/dL (H)).    Allergies  Allergen Reactions  . Hydrocodone Itching and Nausea And Vomiting    Thank you for allowing pharmacy to be a part of this patient's care.  Sloan Leiter, PharmD, BCPS, BCCCP Clinical Pharmacist Please refer to [facility] for [place] numbers 04/04/2020 11:08 AM

## 2020-04-04 NOTE — Progress Notes (Signed)
Speech Language Pathology Discharge Patient Details Name: Wanda Ramirez MRN: [id number] DOB: [birth year] Today's Date: 04/04/2020 Time:  -     Patient discharged from SLP services secondary to medical decline - will need to re-order SLP to resume therapy services. Pt had debridement, plans were for G-tube. Became unstable to have CT. Per MD note, family coming down- possible transfer to comfort care. She is not able to participate in tx at present. If she improves, please reconsult.  Please see latest therapy progress note for current level of functioning and progress toward goals.    Progress and discharge plan discussed with patient and/or caregiver: Patient unable to participate in discharge planning and no caregivers available.   GO     [name] 04/04/2020, 8:17 AM

## 2020-04-04 NOTE — Progress Notes (Signed)
OT Cancellation Note  Patient Details Name: Wanda Ramirez MRN: [id number] DOB: 29-Nov-1970   Cancelled Treatment:    Reason Eval/Treat Not Completed: Other (comment) (Pt with declined medical status poor prognosis not currently able to participate). OT will sign off - should status change feel free to re-order.   [place] 04/04/2020, 8:42 AM   Jesse Sans OTR/L Acute Rehabilitation Services Pager: [phone] Office: [phone]

## 2020-04-05 DIAGNOSIS — J80 Acute respiratory distress syndrome: Secondary | ICD-10-CM | POA: Diagnosis not present

## 2020-04-05 DIAGNOSIS — U071 COVID-19: Secondary | ICD-10-CM | POA: Diagnosis not present

## 2020-04-05 DIAGNOSIS — J9601 Acute respiratory failure with hypoxia: Secondary | ICD-10-CM | POA: Diagnosis not present

## 2020-04-05 DIAGNOSIS — Z930 Tracheostomy status: Secondary | ICD-10-CM

## 2020-04-05 DIAGNOSIS — E1169 Type 2 diabetes mellitus with other specified complication: Secondary | ICD-10-CM | POA: Diagnosis not present

## 2020-04-05 LAB — CBC
HCT: 20.3 % — ABNORMAL LOW (ref 36.0–46.0)
Hemoglobin: 6.7 g/dL — CL (ref 12.0–15.0)
MCH: 28.5 pg (ref 26.0–34.0)
MCHC: 33 g/dL (ref 30.0–36.0)
MCV: 86.4 fL (ref 80.0–100.0)
Platelets: 218 10*3/uL (ref 150–400)
RBC: 2.35 MIL/uL — ABNORMAL LOW (ref 3.87–5.11)
RDW: 17.5 % — ABNORMAL HIGH (ref 11.5–15.5)
WBC: 22.6 10*3/uL — ABNORMAL HIGH (ref 4.0–10.5)
nRBC: 0.2 % (ref 0.0–0.2)

## 2020-04-05 LAB — GLUCOSE, CAPILLARY
Glucose-Capillary: 116 mg/dL — ABNORMAL HIGH (ref 70–99)
Glucose-Capillary: 129 mg/dL — ABNORMAL HIGH (ref 70–99)
Glucose-Capillary: 138 mg/dL — ABNORMAL HIGH (ref 70–99)
Glucose-Capillary: 151 mg/dL — ABNORMAL HIGH (ref 70–99)
Glucose-Capillary: 157 mg/dL — ABNORMAL HIGH (ref 70–99)
Glucose-Capillary: 168 mg/dL — ABNORMAL HIGH (ref 70–99)

## 2020-04-05 LAB — BASIC METABOLIC PANEL
Anion gap: 13 (ref 5–15)
BUN: 48 mg/dL — ABNORMAL HIGH (ref 6–20)
CO2: 24 mmol/L (ref 22–32)
Calcium: 9.4 mg/dL (ref 8.9–10.3)
Chloride: 98 mmol/L (ref 98–111)
Creatinine, Ser: 3.3 mg/dL — ABNORMAL HIGH (ref 0.44–1.00)
GFR, Estimated: 16 mL/min — ABNORMAL LOW (ref >60)
Glucose, Bld: 156 mg/dL — ABNORMAL HIGH (ref 70–99)
Potassium: 4.2 mmol/L (ref 3.5–5.1)
Sodium: 135 mmol/L (ref 135–145)

## 2020-04-05 LAB — HEMOGLOBIN AND HEMATOCRIT, BLOOD
HCT: 22.4 % — ABNORMAL LOW (ref 36.0–46.0)
Hemoglobin: 7.2 g/dL — ABNORMAL LOW (ref 12.0–15.0)

## 2020-04-05 LAB — CULTURE, RESPIRATORY W GRAM STAIN
Culture: NORMAL
Special Requests: NORMAL

## 2020-04-05 LAB — PREPARE RBC (CROSSMATCH)

## 2020-04-05 MED ORDER — OXYCODONE HCL 5 MG PO TABS
10.0000 mg | ORAL_TABLET | Freq: Four times a day (QID) | ORAL | Status: DC
  Administered 2020-04-05 – 2020-05-09 (×128): 10 mg
  Filled 2020-04-05 (×130): qty 2

## 2020-04-05 MED ORDER — SODIUM CHLORIDE 0.9% IV SOLUTION: Freq: Once | INTRAVENOUS | Status: AC

## 2020-04-05 MED ORDER — POLYETHYLENE GLYCOL 3350 17 G PO PACK
17.0000 g | PACK | Freq: Every day | ORAL | Status: DC
  Administered 2020-04-07 – 2020-04-16 (×5): 17 g
  Filled 2020-04-05 (×8): qty 1

## 2020-04-05 MED ORDER — HYDROCERIN EX CREA
TOPICAL_CREAM | Freq: Every day | CUTANEOUS | Status: DC
  Administered 2020-05-04: 1 via TOPICAL
  Filled 2020-04-05: qty 113

## 2020-04-05 MED ORDER — COLLAGENASE 250 UNIT/GM EX OINT
TOPICAL_OINTMENT | Freq: Every day | CUTANEOUS | Status: DC
  Administered 2020-04-06: 1 via TOPICAL
  Filled 2020-04-05: qty 30

## 2020-04-05 MED ORDER — VANCOMYCIN HCL IN DEXTROSE 1-5 GM/200ML-% IV SOLN
1000.0000 mg | INTRAVENOUS | Status: AC
  Administered 2020-04-06: 1000 mg via INTRAVENOUS
  Filled 2020-04-05: qty 200

## 2020-04-05 MED ORDER — MIDODRINE HCL 5 MG PO TABS
10.0000 mg | ORAL_TABLET | Freq: Three times a day (TID) | ORAL | Status: DC
  Administered 2020-04-05 – 2020-05-09 (×77): 10 mg
  Filled 2020-04-05 (×86): qty 2

## 2020-04-05 NOTE — Progress Notes (Signed)
[place] Progress Note Patient Name: Wanda Ramirez DOB: 1971-04-27 MRN: [id number]   Date of Service  04/05/2020  HPI/Events of Note  Anemia - Hgb = 6.7.   eICU Interventions  Will transfuse 1 unit PRBC now.     Intervention Category Major Interventions: Other:  Lysle Dingwall 04/05/2020, 6:56 AM

## 2020-04-05 NOTE — Progress Notes (Signed)
Pt hbg 6.7 called elink notified MD.

## 2020-04-05 NOTE — Progress Notes (Signed)
Patient left in PS/CPAP mode overnight due to being more comfortable and more synchronous compared to full support. RT will continue to monitor.

## 2020-04-05 NOTE — Progress Notes (Signed)
PCCM progress note  I was notified by bedside RN that patient and daughter wanted to discuss CODE STATUS further I called both Wanda Ramirez and Wanda Ramirez to again discuss patient's current clinical course and answer any further questions regarding CODE STATUS. Both daughters were updated regarding current clinical course and likely projection. They both reported they understood and wished to continue with current plan of care including further surgical interventions and aggressive measures. Both daughters therefore agreed that given patient's significant comorbidities and likely inability to survive cardiac arrest that Wanda Ramirez should remain a DO NOT RESUSCITATE.   Wanda Cancel, NP-C [facility] Contact / Pager information can be found on Amion  04/05/2020, 2:09 PM

## 2020-04-05 NOTE — Progress Notes (Signed)
Pharmacy Antibiotic Note  Wanda Ramirez is a 49 y.o. female admitted on 02/13/2020 with necrotizing wound infection and MSSA pneumonia.  Pharmacy has been consulted for vancomycin and zosyn dosing.   11/2 Wound culture with few gram negative rods, still pending.  11/2 Repeat trach aspirate - pending.   HD currently off schedule from PTA regimen of TTS. Plan per Nephrology is that next HD will be tomorrow 11/5 off schedule. Will give Vancomycin after HD tomorrow.  WBC remains elevated but trending down 22.6, Tmax 101.3- post OR debridement on 11/1 with plan for another debridement on 11/5.   Plan: Give Vancomycin 1 gram post HD tomorrow   No scheduled vancomycin at this time - follow up HD schedule May benefit from Pre-HD Vancomycin level prior to next subsequent dose if Vancomycin is to be continued.  Continue Zosyn 2.25 gm IV every 8 hours F/u cultures and clinical course  Height: 5\' 2"  (157.5 cm) Weight: 108.4 kg (238 lb 15.7 oz) IBW/kg (Calculated) : 50.1  Temp (24hrs), Avg:99.9 F (37.7 C), Min:98.9 F (37.2 C), Max:101.3 F (38.5 C)  Recent Labs  Lab 04/02/20 0355 04/02/20 0355 04/03/20 0220 04/03/20 1023 04/03/20 1919 04/04/20 0423 04/05/20 0601  WBC 26.6*   < > 19.6* 20.4* 23.0* 27.7* 22.6*  CREATININE 4.62*  --  2.58* 2.87*  --  3.90* 3.30*  LATICACIDVEN  --   --  2.1*  --   --   --   --    < > = values in this interval not displayed.    Estimated Creatinine Clearance: 23.9 mL/min (A) (by C-G formula based on SCr of 3.3 mg/dL (H)).    Allergies  Allergen Reactions  . Hydrocodone Itching and Nausea And Vomiting    Thank you for allowing pharmacy to be a part of this patient's care.  Sloan Leiter, PharmD, BCPS, BCCCP Clinical Pharmacist Please refer to [facility] for [place] numbers 04/05/2020 3:30 PM

## 2020-04-05 NOTE — Progress Notes (Signed)
NAME:  Wanda Ramirez, MRN:  [id number], DOB:  25-May-1971, LOS: 26 ADMISSION DATE:  02/13/2020, CONSULTATION DATE:  9/24 REFERRING MD:  Heber Angola, CHIEF COMPLAINT:  Dyspnea   Brief History   49 yo female admitted with COVID 19 pneumonia on 9/13, had been diagnosed with COVID on 9/9.  On 9/24 her condition worsened and PCCM was consulted, she was sent to the ICU and treated with BIPAP, then eventually intubated.  Started on CRRT through a tunneled HD cath. Extubated on 10/6 but re-intubated on 10/8.  Has been challenged by sedation needs since admission.   Past Medical History  Sleep apnea Morbid obesity Hypothyroidism Hypertension GERD  End-stage renal disease on HD MWF Diabetes Asthma  Significant Hospital Events   9/14 admit 9/25 on BiPAP with Precedex overnight 9/26 -unable to run CRRT with prone ventilation.  Resume supine ventilation but still unable to draw blood back. 9/27 transferred to 51m. IR replaced HD catheter 10/1 on CRRT and vasopressors 10/6 - extubated 10/7 - had some stridor and increased WOB so placed on BIPAP and dexamethasone given.  10/15 weaning sedation. midaz off 10/16 move to 38M 10/18 s/p 6 proximal XLT Shiley tracheostomy  10/19: Resumed Eliquis low dose. Per Dr. Thompson Caul note 10/7 will need to be on [facility] for 1 month then reasses 10/19: Episode of hypoglycemia 55. 10/21 hypoglycemic again. levemir and tubefeed coverage decreased. Low grade temp->sputum culture send had vomiting event on 10/19 w/ concern for aspiration. Fent was stopped 10/20. Still gets anxious at night. Required increased precedex. Added clonazepam. 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 11/2 Surgical debridement   Consults:   Nephrology  Procedures:  9/23 tunneled HD cath placement >  10/8 ETT > 10/18 10/18 6 proximal XLT Shiley tracheostomy  >  Significant Diagnostic Tests:  9/21 Vas ultrasound> thrombosed LUE fistula 9/21 VQ scan > negative for PE 9/29 echo> LVEF 60-65%, flat ventrcile consistent with RV pressure overload, RV severely enlarged, moderate reduction in RV function  Micro Data:  9/13 blood > neg   9/14 sars cov 2 > pos 9/26 blood > neg 10/8 resp > MSSA 10/8 blood > negative  10/21 sputum > Normal flora  10/25 Blood > ngtd x 3 days 10/25 Sputum  > few diphtheroids  10/25 Blood >>negative 10/29 Sputum >> Staph aureus  Antimicrobials:  9/14 remdesivir > 9/19 9/15 tocilizumab > x1 9/20 cefepime > 9/29 9/20 azithro >  9/25 10/8 vanc > 10/9 10/8 zosyn > x1 10/10 ceftriaxone > 10/16 10/31 vancomycin >>   Interim history/subjective:  No acute events overnight Tmax 101.3 Hgb drop again to 6.7 this morning, S/P 1 unit PRBC   Objective   Blood pressure (!) 132/57, pulse 96, temperature (!) 101.3 F (38.5 C), temperature source Axillary, resp. rate 20, height 5\' 2"  (1.575 m), weight 108.4 kg, SpO2 100 %.    Vent Mode: PSV;CPAP FiO2 (%):  [40 %] 40 % Set Rate:  [35 bmp] 35 bmp Vt Set:  [400 mL] 400 mL PEEP:  [5 cmH20] 5 cmH20 Pressure Support:  [5 cmH20-10 cmH20] 5 cmH20 Plateau Pressure:  [28 cmH20] 28 cmH20   Intake/Output Summary (Last 24 hours) at 04/05/2020 0747 Last data filed at 04/05/2020 0600 Gross per 24 hour  Intake 1764.66  ml  Output 1075 ml  Net 689.66 ml   Filed Weights   04/04/20 0710 04/04/20 1039 04/05/20 0500  Weight: 108.8 kg 107.5 kg 108.4 kg   Examination: General: Chronically ill appearing elderly deconditioned female lying in bed on mechanical ventilation through trach, in NAD HEENT: 6 cuffed proximal XLT trach, MM pink/moist, PERRL,  Neuro: Will open eyes to verbal stimuli but unable to follow any commands  CV: s1s2 regular rate and rhythm, no  murmur, rubs, or gallops,  PULM:  Bilateral rhonchi improved from day prior, tolerating CPAP trial currently  GI: soft, bowel sounds active in all 4 quadrants, non-tender, non-distended, tolerating TF Extremities: warm/dry, no edema  Skin: no rashes or lesions   Resolved Hospital Problem list   Thrombocytopenia  MSSA PNA  Nausea   Assessment & Plan:   Acute hypoxic respiratory failure from COVID 19 pneumonia with ARDS complicated by MSSA PNA. Failure to wean s/p tracheostomy. - Transition to DNR, family planning to change to comfort care once family gets in from [place].  P: Continue ventilator support with lung protective strategies  Wean PEEP and FiO2 for sats greater than 90%. Head of bed elevated 30 degrees. Plateau pressures less than 30 cm H20.  Follow intermittent chest x-ray and ABG.   Ensure adequate pulmonary hygiene  Follow cultures  VAP bundle in place  PAD protocol Continue IV antibiotics   Shock: septic Necrotizing infection of soft tissue surrounding sacral wound -Patient began displaying evidence of sepsis with shock physiology 11/1 prompting her evaluation including CT scan that revealed gas surrounding soft tissue prompting surgical consult P: Family has requested continue aggressive care including repeat surgical debridement  Tentative plan for repeat debridement 11/5 Continue Empiric antibiotics  Continue pressor support   ESRD P: Nephrology following, appreciate assistance  Follow renal function / urine output Trend Bmet Avoid nephrotoxins, ensure adequate renal perfusion  Enteral hydration   Staph tracheobronchitis vs pneumonia.   -MSSA P: Treatment as above  Encourage adequate pulmonary hygiene  Vent support as above   Acute metabolic encephalopathy in setting of sepsis Agitated delirium P: Slightly improved on current regiment  RASS goal 0  Continue Klonipin, Oxycodone, and Seroquel   Anemia  -r/t to blood loss from sacrum and anemia of  critical illness. S/p 4 units PRBC P: Hgb again dropped to 6.7 Transfuse another unit of PRBC now Trend CBC  Transfuse for hgb > 7 Wound care   Hypothyroidism P: Continue Synthroid   DM type 2 with labile blood sugars. P: Continue SSI, Lantus, and tube feed coverage CBG checks q4hrs  Dysphagia. - IR has seen with plans for PEG this week, but with GOC plans changing it will likely not be necessary. Continue with cortrak.   Pressure ulcer -Unstageable sacral ulcer stage II right neck presure injury  P: Wound care  Pressure alleviating devices  Frequent turns  Ensure adequate nutrition   GOC Family discussion 11/2. Dr. Jonnie Finner and Dr. Bobbye Morton present during this conversation family elects for DNR and transition to comfort care.  However on further discussion 11/3 with both daughters identifying family has decided to pursue current aggressive measures and would like additional operative intervention if indicated.  Will discuss with general surgery regarding surgical plans.  Patient continues to remain DO NOT RESUSCITATE.  Best practice:  Diet: tube feeding DVT prophylaxis: Heparin Trooper GI prophylaxis: Protonix Mobility: Bed rest Code Status: DNR Family discussion: Daughters updated over the phone on 11/4 Disposition: ICU  Labs:  CMP Latest Ref Rng & Units 04/05/2020 04/04/2020 04/03/2020  Glucose 70 - 99 mg/dL 156(H) 167(H) -  BUN 6 - 20 mg/dL 48(H) 68(H) -  Creatinine 0.44 - 1.00 mg/dL 3.30(H) 3.90(H) -  Sodium 135 - 145 mmol/L 135 136 133(L)  Potassium 3.5 - 5.1 mmol/L 4.2 4.9 4.8  Chloride 98 - 111 mmol/L 98 100 -  CO2 22 - 32 mmol/L 24 22 -  Calcium 8.9 - 10.3 mg/dL 9.4 9.6 -  Total Protein 6.5 - 8.1 g/dL - 5.8(L) -  Total Bilirubin 0.3 - 1.2 mg/dL - 4.0(H) -  Alkaline Phos 38 - 126 U/L - 202(H) -  AST 15 - 41 U/L - 62(H) -  ALT 0 - 44 U/L - 20 -    CBC Latest Ref Rng & Units 04/05/2020 04/04/2020 04/03/2020  WBC 4.0 - 10.5 K/uL 22.6(H) 27.7(H) 23.0(H)  Hemoglobin  12.0 - 15.0 g/dL 6.7(LL) 7.7(L) 8.0(L)  Hematocrit 36 - 46 % 20.3(L) 22.9(L) 24.3(L)  Platelets 150 - 400 K/uL 218 208 222    ABG    Component Value Date/Time   PHART 7.250 (L) 04/03/2020 1030   PCO2ART 64.8 (H) 04/03/2020 1030   PO2ART 156 (H) 04/03/2020 1030   HCO3 28.4 (H) 04/03/2020 1030   TCO2 30 04/03/2020 1030   ACIDBASEDEF 2.0 04/03/2020 0907   O2SAT 99.0 04/03/2020 1030    CBG (last 3)  Recent Labs    04/04/20 1945 04/04/20 2323 04/05/20 0346  GLUCAP 158* 141* 116*    Critical care time:    Performed by: Johnsie Cancel  Total critical care time: 40 minutes  Critical care time was exclusive of separately billable procedures and treating other patients.  Critical care was necessary to treat or prevent imminent or life-threatening deterioration.  Critical care was time spent personally by me on the following activities: development of treatment plan with patient and/or surrogate as well as nursing, discussions with consultants, evaluation of patient's response to treatment, examination of patient, obtaining history from patient or surrogate, ordering and performing treatments and interventions, ordering and review of laboratory studies, ordering and review of radiographic studies, pulse oximetry and re-evaluation of patient's condition.  Johnsie Cancel, NP-C [facility] Contact / Pager information can be found on Amion  04/05/2020, 7:47 AM

## 2020-04-05 NOTE — Progress Notes (Signed)
Assisted tele visit to patient with family member.  Jaeshawn Silvio D Braxden Lovering, RN   

## 2020-04-05 NOTE — Progress Notes (Signed)
Assisted tele visit to patient with daughter. ° °Fenix Ruppe P, RN  °

## 2020-04-05 NOTE — Progress Notes (Signed)
[facility] Progress Note   OP HD:TTS 4h 45mn 450/800 2/2.25 bath 111.5kg Hep 2000 L AVF - darbe 50 ug q week, last 9/7 - calc 1.0 tiw - 9/9 Hb 10.0, tsat 22%  49yo female w/ ESRD on TTS HD admitted for COVID PNA on 02/13/20. She was intubated. SP CRRT around 9/26- 10/7. Now is on intermittent HD. She is sp trach. She had MSSA pna. DM2.  Assessment/ Plan:   1. COVID pna /sp trach -trying to wean from vent, not having success 2. Septic shock - on 2 pressors, dosing coming down some today 3. Sacral decub w/ necrosis - sp I&D 11/2 by gen surg, broad spec abx IV 4. SP MSSA HCAP/ VAP 5. ESRD -usual HD TTS.sp CRRT 9/26- 10/7. - HD tomorrow off schedule - UF likely small given pressor support  4. BP/ volume -euvolemic on exam, minimal edema 5. HD access: LUA AVF clotted while here. TDC x2 here by IR.LIJ TC 6. 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 Phos 3.9 10/30 8. DM2 - per pmd  RKelly Splinter MD 04/05/2020, 1:11 PM      Subjective:   Seen in ICU, asleep   Objective:   BP (!) 132/57   Pulse 96   Temp 99.6 F (37.6 C) (Axillary)   Resp 15   Ht '5\' 2"'  (1.575 m)   Wt 108.4 kg   SpO2 100%   BMI 43.71 kg/m   Intake/Output Summary (Last 24 hours) at 04/05/2020 1311 Last data filed at 04/05/2020 1200 Gross per 24 hour  Intake 1425.87 ml  Output 275 ml  Net 1150.87 ml   Weight change: 0 kg  Physical Exam: More alert, NG in , +trach collar CVS-tachy ABD- BS present soft non-distended  EXT- no edema LE's, lt arm access thrombosed ACCESS: LIJ TC  Imaging: No results found.  Labs: BMET Recent Labs  Lab 03/30/20 0139 03/30/20 0139 03/31/20 0119 03/31/20 0119 04/01/20 0320 04/01/20 0320 04/02/20 0355 04/02/20 0355 04/03/20 0220 04/03/20 0295611/02/21 0745 04/03/20 0821 04/03/20 0907 04/03/20 1023 04/03/20 1030 04/04/20 0423  04/05/20 0601  NA 134*   < > 132*   < > 133*   < > 130*   < > 132*   < > 133* 134* 133* 133* 133* 136 135  K 4.9   < > 5.2*   < > 4.3   < > 5.1   < > 4.4   < > 5.1 5.2* 5.0 4.8 4.8 4.9 4.2  CL 94*   < > 92*  --  93*  --  92*  --  93*  --   --   --   --  97*  --  100 98  CO2 25   < > 24  --  27  --  23  --  25  --   --   --   --  24  --  22 24  GLUCOSE 182*   < > 156*  --  255*  --  241*  --  163*  --   --   --   --  130*  --  167* 156*  BUN 44*   < > 75*  --  42*  --  80*  --  40*  --   --   --   --  47*  --  68* 48*  CREATININE  3.98*   < > 5.40*  --  3.05*  --  4.62*  --  2.58*  --   --   --   --  2.87*  --  3.90* 3.30*  CALCIUM 9.7   < > 10.6*  --  10.2  --  10.5*  --  9.2  --   --   --   --  8.7*  --  9.6 9.4  PHOS 3.2  --  3.9  --   --   --   --   --  3.2  --   --   --   --   --   --  5.7*  --    < > = values in this interval not displayed.   CBC Recent Labs  Lab 04/03/20 1023 04/03/20 1023 04/03/20 1030 04/03/20 1919 04/04/20 0423 04/05/20 0601  WBC 20.4*  --   --  23.0* 27.7* 22.6*  HGB 9.1*   < > 9.2* 8.0* 7.7* 6.7*  HCT 28.2*   < > 27.0* 24.3* 22.9* 20.3*  MCV 90.1  --   --  86.5 85.4 86.4  PLT 234  --   --  222 208 218   < > = values in this interval not displayed.    Medications:    . B-complex with vitamin C  1 tablet Per Tube Daily  . chlorhexidine gluconate (MEDLINE KIT)  15 mL Mouth Rinse BID  . Chlorhexidine Gluconate Cloth  6 each Topical Q0600  . clonazepam  0.5 mg Per Tube TID  . collagenase   Topical Daily  . darbepoetin (ARANESP) injection - DIALYSIS  150 mcg Intravenous Q Tue-HD  . fentaNYL (SUBLIMAZE) injection  25 mcg Intravenous Once  . fiber  1 packet Per Tube BID  . hydrocerin   Topical Daily  . insulin aspart  0-20 Units Subcutaneous Q4H  . insulin aspart  5 Units Subcutaneous Q4H  . levothyroxine  125 mcg Per Tube Q0600  . mouth rinse  15 mL Mouth Rinse 10 times per day  . midodrine  10 mg Per Tube TID WC  . oxyCODONE  10 mg Per Tube Q6H  .  pantoprazole sodium  40 mg Per Tube BID  . [START ON 04/06/2020] polyethylene glycol  17 g Per Tube Daily  . QUEtiapine  100 mg Per Tube BID  . sodium chloride flush  10-40 mL Intracatheter Q12H  . vancomycin variable dose per unstable renal function (pharmacist dosing)   Does not apply See admin instructions

## 2020-04-06 ENCOUNTER — Inpatient Hospital Stay (HOSPITAL_COMMUNITY): Payer: Self-pay | Admitting: Certified Registered Nurse Anesthetist

## 2020-04-06 ENCOUNTER — Encounter (HOSPITAL_COMMUNITY)
Admission: EM | Disposition: A | Discharge status: Rehabilitation Facility | Payer: Self-pay | Source: Ambulatory Visit | Attending: Internal Medicine

## 2020-04-06 ENCOUNTER — Encounter (HOSPITAL_COMMUNITY): Payer: Self-pay | Admitting: Certified Registered Nurse Anesthetist

## 2020-04-06 DIAGNOSIS — U071 COVID-19: Secondary | ICD-10-CM | POA: Diagnosis not present

## 2020-04-06 LAB — GLUCOSE, CAPILLARY
Glucose-Capillary: 120 mg/dL — ABNORMAL HIGH (ref 70–99)
Glucose-Capillary: 139 mg/dL — ABNORMAL HIGH (ref 70–99)
Glucose-Capillary: 151 mg/dL — ABNORMAL HIGH (ref 70–99)
Glucose-Capillary: 166 mg/dL — ABNORMAL HIGH (ref 70–99)
Glucose-Capillary: 174 mg/dL — ABNORMAL HIGH (ref 70–99)
Glucose-Capillary: 223 mg/dL — ABNORMAL HIGH (ref 70–99)

## 2020-04-06 LAB — CBC
HCT: 20.3 % — ABNORMAL LOW (ref 36.0–46.0)
Hemoglobin: 6.5 g/dL — CL (ref 12.0–15.0)
MCH: 28.4 pg (ref 26.0–34.0)
MCHC: 32 g/dL (ref 30.0–36.0)
MCV: 88.6 fL (ref 80.0–100.0)
Platelets: 208 10*3/uL (ref 150–400)
RBC: 2.29 MIL/uL — ABNORMAL LOW (ref 3.87–5.11)
RDW: 17.2 % — ABNORMAL HIGH (ref 11.5–15.5)
WBC: 20 10*3/uL — ABNORMAL HIGH (ref 4.0–10.5)
nRBC: 0.1 % (ref 0.0–0.2)

## 2020-04-06 LAB — BASIC METABOLIC PANEL
Anion gap: 15 (ref 5–15)
BUN: 58 mg/dL — ABNORMAL HIGH (ref 6–20)
CO2: 21 mmol/L — ABNORMAL LOW (ref 22–32)
Calcium: 9 mg/dL (ref 8.9–10.3)
Chloride: 98 mmol/L (ref 98–111)
Creatinine, Ser: 4.33 mg/dL — ABNORMAL HIGH (ref 0.44–1.00)
GFR, Estimated: 12 mL/min — ABNORMAL LOW (ref >60)
Glucose, Bld: 199 mg/dL — ABNORMAL HIGH (ref 70–99)
Potassium: 5.4 mmol/L — ABNORMAL HIGH (ref 3.5–5.1)
Sodium: 134 mmol/L — ABNORMAL LOW (ref 135–145)

## 2020-04-06 LAB — PREPARE RBC (CROSSMATCH)

## 2020-04-06 LAB — HEMOGLOBIN AND HEMATOCRIT, BLOOD
HCT: 20 % — ABNORMAL LOW (ref 36.0–46.0)
Hemoglobin: 6.5 g/dL — CL (ref 12.0–15.0)

## 2020-04-06 SURGERY — INCISION AND DRAINAGE, ABSCESS
Anesthesia: General | Laterality: Left

## 2020-04-06 MED ORDER — SODIUM CHLORIDE 0.9% IV SOLUTION: Freq: Once | INTRAVENOUS | Status: DC

## 2020-04-06 MED ORDER — FENTANYL CITRATE (PF) 250 MCG/5ML IJ SOLN
INTRAMUSCULAR | Status: AC
  Filled 2020-04-06: qty 5

## 2020-04-06 MED ORDER — PROPOFOL 10 MG/ML IV BOLUS
INTRAVENOUS | Status: AC
  Filled 2020-04-06: qty 20

## 2020-04-06 MED ORDER — HEPARIN SODIUM (PORCINE) 1000 UNIT/ML IJ SOLN
INTRAMUSCULAR | Status: AC
  Administered 2020-04-06: 1000 [IU]
  Filled 2020-04-06: qty 1

## 2020-04-06 MED ORDER — VITAL 1.5 CAL PO LIQD
1000.0000 mL | ORAL | Status: DC
  Administered 2020-04-06 – 2020-04-13 (×5): 1000 mL
  Filled 2020-04-06 (×6): qty 1000

## 2020-04-06 MED ORDER — MIDAZOLAM HCL 2 MG/2ML IJ SOLN
INTRAMUSCULAR | Status: AC
  Filled 2020-04-06: qty 2

## 2020-04-06 MED ORDER — FENTANYL CITRATE (PF) 100 MCG/2ML IJ SOLN
50.0000 ug | INTRAMUSCULAR | Status: DC | PRN
  Filled 2020-04-06: qty 2

## 2020-04-06 MED ORDER — PROSOURCE TF PO LIQD
45.0000 mL | Freq: Three times a day (TID) | ORAL | Status: DC
  Administered 2020-04-06 – 2020-04-10 (×12): 45 mL
  Filled 2020-04-06 (×12): qty 45

## 2020-04-06 MED ORDER — FENTANYL CITRATE (PF) 100 MCG/2ML IJ SOLN
50.0000 ug | INTRAMUSCULAR | Status: DC | PRN
  Administered 2020-04-06 – 2020-04-09 (×6): 100 ug via INTRAVENOUS
  Administered 2020-04-09: 200 ug via INTRAVENOUS
  Administered 2020-04-09 (×2): 100 ug via INTRAVENOUS
  Administered 2020-04-09: 50 ug via INTRAVENOUS
  Administered 2020-04-10 (×2): 200 ug via INTRAVENOUS
  Administered 2020-04-11: 100 ug via INTRAVENOUS
  Filled 2020-04-06: qty 4
  Filled 2020-04-06 (×5): qty 2
  Filled 2020-04-06: qty 4
  Filled 2020-04-06 (×5): qty 2
  Filled 2020-04-06: qty 4

## 2020-04-06 NOTE — Progress Notes (Signed)
NAME:  Wanda Ramirez, MRN:  [id number], DOB:  1971-01-20, LOS: 23 ADMISSION DATE:  02/13/2020, CONSULTATION DATE:  9/24 REFERRING MD:  [place], CHIEF COMPLAINT:  Dyspnea   Brief History   49 yo female admitted with COVID 19 pneumonia on 9/13, had been diagnosed with COVID on 9/9.  On 9/24 her condition worsened and PCCM was consulted, she was sent to the ICU and treated with BIPAP, then eventually intubated.  Started on CRRT through a tunneled HD cath. Extubated on 10/6 but re-intubated on 10/8.  Has been challenged by sedation needs since admission.   Past Medical History  Sleep apnea Morbid obesity Hypothyroidism Hypertension GERD  End-stage renal disease on HD MWF Diabetes Asthma  Significant Hospital Events   9/14 admit 9/25 on BiPAP with Precedex overnight 9/26 -unable to run CRRT with prone ventilation.  Resume supine ventilation but still unable to draw blood back. 9/27 transferred to 32m. IR replaced HD catheter 10/1 on CRRT and vasopressors 10/6 - extubated 10/7 - had some stridor and increased WOB so placed on BIPAP and dexamethasone given.  10/15 weaning sedation. midaz off 10/16 move to 8M 10/18 s/p 6 proximal XLT Shiley tracheostomy  10/19: Resumed Eliquis low dose. Per Dr. Thompson Caul note 10/7 will need to be on [facility] for 1 month then reasses 10/19: Episode of hypoglycemia 55. 10/21 hypoglycemic again. levemir and tubefeed coverage decreased. Low grade temp->sputum culture send had vomiting event on 10/19 w/ concern for aspiration. Fent was stopped 10/20. Still gets anxious at night. Required increased precedex. Added clonazepam. 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 11/2 Surgical debridement   Consults:   Nephrology  Procedures:  9/23 tunneled HD cath placement >  10/8 ETT > 10/18 10/18 6 proximal XLT Shiley tracheostomy  >  Significant Diagnostic Tests:  9/21 Vas ultrasound> thrombosed LUE fistula 9/21 VQ scan > negative for PE 9/29 echo> LVEF 60-65%, flat ventrcile consistent with RV pressure overload, RV severely enlarged, moderate reduction in RV function  Micro Data:  9/13 blood > neg   9/14 sars cov 2 > pos 9/26 blood > neg 10/8 resp > MSSA 10/8 blood > negative  10/21 sputum > Normal flora  10/25 Blood > ngtd x 3 days 10/25 Sputum  > few diphtheroids  10/25 Blood >>negative 10/29 Sputum >> Staph aureus  Antimicrobials:  9/14 remdesivir > 9/19 9/15 tocilizumab > x1 9/20 cefepime > 9/29 9/20 azithro >  9/25 10/8 vanc > 10/9 10/8 zosyn > x1 10/10 ceftriaxone > 10/16 10/31 vancomycin >>   Interim history/subjective:  Hgb again dropped overnight to 6.5, s/p another unit of PRBC Afebrile overnight   Objective   Blood pressure 129/64, pulse 94, temperature 98.8 F (37.1 C), temperature source Oral, resp. rate 16, height 5\' 2"  (1.575 m), weight 108.6 kg, SpO2 100 %.    Vent Mode: PRVC FiO2 (%):  [40 %] 40 % Set Rate:  [16 bmp] 16 bmp Vt Set:  [400 mL] 400 mL PEEP:  [5 cmH20] 5 cmH20 Pressure Support:  [10 cmH20] 10 cmH20 Plateau Pressure:  [12 cmH20] 12 cmH20   Intake/Output Summary (Last 24 hours) at 04/06/2020 0915 Last data filed at 04/06/2020 0800 Gross per 24 hour  Intake 2498.76 ml  Output 200 ml  Net  2298.76 ml   Filed Weights   04/04/20 1039 04/05/20 0500 04/06/20 0500  Weight: 107.5 kg 108.4 kg 108.6 kg   Examination: General: Chronically ill appearing elderly female lying in bed on mechanical ventilation, in NAD HEENT: 6cuffed proximal XLT trach,, MM pink/moist, PERRL,  Neuro: Opens eyes to verbal stimuli, unable to follow commands  CV: s1s2 regular rate and rhythm, no murmur, rubs, or gallops,  PULM:  Rhonchi bilaterally, no increased work of  breathing, tolerating vent well  GI: soft, bowel sounds active in all 4 quadrants, non-tender, non-distended, tolerating TF Extremities: warm/dry, no edema  Skin: no rashes or lesions  Resolved Hospital Problem list   Thrombocytopenia  MSSA PNA  Nausea  Agitated delirium  Assessment & Plan:   Acute hypoxic respiratory failure from COVID 19 pneumonia with ARDS complicated by MSSA PNA. Failure to wean s/p tracheostomy. - Transition to DNR, family planning to change to comfort care once family gets in from [place].  P: Hold SBT this am with plan to return to OR Continue ventilator support with lung protective strategies  Wean PEEP and FiO2 for sats greater than 90%. Head of bed elevated 30 degrees. Plateau pressures less than 30 cm H20.  Follow intermittent chest x-ray and ABG.   Ensure adequate pulmonary hygiene  VAP bundle in place  PAD protocol Continue IV antibiotics   Shock: septic Necrotizing infection of soft tissue surrounding sacral wound -Patient began displaying evidence of sepsis with shock physiology 11/1 prompting her evaluation including CT scan that revealed gas surrounding soft tissue prompting surgical consult P: Continue aggressive measures per family request Further debridement planned 11/5 Continue empiric antibiotics  Continue pressor support   ESRD P: Receiving iHD again today  Nephrology following, appreciate assistance  Follow renal function  Trend Bmet  Enteral hydration   Staph tracheobronchitis vs pneumonia.   -MSSA P: Treatment as above  Encourage pulmonary hygiene  Vent support as above   Acute metabolic encephalopathy in setting of sepsis P: Well controlled on current regiment  RASS goal 0 Continue Klonipin, Oxycodone, and Seroquel   Anemia  -r/t to blood loss from sacrum and anemia of critical illness. S/p 6 units PRBC P: Hgb again dropped to 6.5 overnight  S/P another unit of PRBC this am  Trend CBC  Transfuse per protocol   Wound care   Hypothyroidism P: Continue synthroid   DM type 2 with labile blood sugars. P: Continue SSI, Lantus, and tube feeds CBG checks q4hrs  Dysphagia. - Family would no like to purse PEG placement, IR notified and will continue to follow patients clinical course for availability to safely place PEG tube   Pressure ulcer -Unstageable sacral ulcer stage II right neck presure injury  P: Wound care  Pressure alleviating devices  Frequent turns   [place] Family discussion 11/2. Dr. Jonnie Finner and Dr. Bobbye Morton present during this conversation family elects for DNR and transition to comfort care.  However on further discussion 11/3 with both daughters identifying family has decided to pursue current aggressive measures and would like additional operative intervention if indicated.  Will discuss with general surgery regarding surgical plans.  Patient continues to remain DO NOT RESUSCITATE.  Best practice:  Diet: tube feeding DVT prophylaxis: Heparin [place] GI prophylaxis: Protonix Mobility: Bed rest Code Status: DNR Family discussion: Daughters updated over the phone on 11/5 Disposition: ICU  Labs:   CMP Latest Ref Rng & Units 04/06/2020 04/05/2020 04/04/2020  Glucose 70 - 99 mg/dL 199(H) 156(H) 167(H)  BUN  6 - 20 mg/dL 58(H) 48(H) 68(H)  Creatinine 0.44 - 1.00 mg/dL 4.33(H) 3.30(H) 3.90(H)  Sodium 135 - 145 mmol/L 134(L) 135 136  Potassium 3.5 - 5.1 mmol/L 5.4(H) 4.2 4.9  Chloride 98 - 111 mmol/L 98 98 100  CO2 22 - 32 mmol/L 21(L) 24 22  Calcium 8.9 - 10.3 mg/dL 9.0 9.4 9.6  Total Protein 6.5 - 8.1 g/dL - - 5.8(L)  Total Bilirubin 0.3 - 1.2 mg/dL - - 4.0(H)  Alkaline Phos 38 - 126 U/L - - 202(H)  AST 15 - 41 U/L - - 62(H)  ALT 0 - 44 U/L - - 20    CBC Latest Ref Rng & Units 04/06/2020 04/05/2020 04/05/2020  WBC 4.0 - 10.5 K/uL 20.0(H) - 22.6(H)  Hemoglobin 12.0 - 15.0 g/dL 6.5(LL) 7.2(L) 6.7(LL)  Hematocrit 36 - 46 % 20.3(L) 22.4(L) 20.3(L)  Platelets 150 - 400 K/uL 208 - 218     ABG    Component Value Date/Time   PHART 7.250 (L) 04/03/2020 1030   PCO2ART 64.8 (H) 04/03/2020 1030   PO2ART 156 (H) 04/03/2020 1030   HCO3 28.4 (H) 04/03/2020 1030   TCO2 30 04/03/2020 1030   ACIDBASEDEF 2.0 04/03/2020 0907   O2SAT 99.0 04/03/2020 1030    CBG (last 3)  Recent Labs    04/05/20 2356 04/06/20 0329 04/06/20 0827  GLUCAP 151* 166* 151*    Critical care time:    Performed by: Johnsie Cancel  Total critical care time: 42minutes  Critical care time was exclusive of separately billable procedures and treating other patients.  Critical care was necessary to treat or prevent imminent or life-threatening deterioration.  Critical care was time spent personally by me on the following activities: development of treatment plan with patient and/or surrogate as well as nursing, discussions with consultants, evaluation of patient's response to treatment, examination of patient, obtaining history from patient or surrogate, ordering and performing treatments and interventions, ordering and review of laboratory studies, ordering and review of radiographic studies, pulse oximetry and re-evaluation of patient's condition.  Johnsie Cancel, NP-C [facility] Contact / Pager information can be found on Amion  04/06/2020, 9:15 AM

## 2020-04-06 NOTE — TOC Progression Note (Signed)
Transition of Care [facility]) - Progression Note    Patient Details  Name: Wanda Ramirez MRN: [id number] Date of Birth: 1971/01/01  Transition [facility] Care [facility]) CM/SW [place], RN Phone Number: 04/06/2020, 1:54 PM  Clinical Narrative:     Discussion past 2 days with daughters concerning code status, were going to [place] care, but then decided to go the opposite and enact aggressive treatments including surgeries etc if needed. The patient will remain a DNR, but they want aggressive treatments up to the time of arrest.  The patient is on day 52 of hospitalization. If he does survive, he will need extensive rehabilitation and reconditioning, such as LTAC vent SNF, patient has only medicaid for insurance. CM will continue to follow for needs.  Expected Discharge Plan: Home/Self Care Barriers to Discharge: Continued Medical Work up  Expected Discharge Plan and Services Expected Discharge Plan: Home/Self Care   Discharge Planning Services: CM Consult   Living arrangements for the past 2 months: Apartment                                       Social Determinants of Health (SDOH) Interventions    Readmission Risk Interventions No flowsheet data found.

## 2020-04-06 NOTE — Progress Notes (Signed)
[facility] Progress Note Patient Name: PATRYCJA MUMPOWER DOB: [birth year] MRN: [id number]   Date of Service  04/06/2020  HPI/Events of Note  Anemia - Hgb = 6.5.   eICU Interventions  Will transfuse 1 unit PRBC now.      Intervention Category Major Interventions: Other:  Carletta Feasel Cornelia Copa 04/06/2020, 6:15 AM

## 2020-04-06 NOTE — Progress Notes (Signed)
Assisted tele visit to patient with daughter.  Marcia Hartwell M, RN

## 2020-04-06 NOTE — Progress Notes (Signed)
General Surgery Follow Up Note  Subjective:    Overnight Issues:   Objective:  Vital signs for last 24 hours: Temp:  [98.5 F (36.9 C)-100 F (37.8 C)] 98.8 F (37.1 C) (11/05 0824) Pulse Rate:  [86-109] 105 (11/05 0802) Resp:  [15-25] 20 (11/05 0802) BP: (124-140)/(59-71) 140/71 (11/05 0802) SpO2:  [73 %-100 %] 99 % (11/05 0802) Arterial Line BP: (107-147)/(42-76) 129/60 (11/05 0500) FiO2 (%):  [40 %] 40 % (11/05 0802) Weight:  [108.6 kg] 108.6 kg (11/05 0500)  Hemodynamic parameters for last 24 hours:    Intake/Output from previous day: 11/04 0701 - 11/05 0700 In: 2145.1 [I.V.:1125; Blood:630; NG/GT:240; IV Piggyback:150] Out: 200 [Stool:200]  Intake/Output this shift: Total I/O In: 353.7 [I.V.:43.7; Blood:310] Out: -   Vent settings for last 24 hours: Vent Mode: PSV;CPAP FiO2 (%):  [40 %] 40 % Set Rate:  [16 bmp] 16 bmp Vt Set:  [400 mL] 400 mL PEEP:  [5 cmH20] 5 cmH20 Pressure Support:  [10 cmH20] 10 cmH20 Plateau Pressure:  [12 cmH20] 12 cmH20  Physical Exam:  Gen: comfortable, no distress Neuro: does not follow commands HEENT: trached CV: RRR Pulm: unlabored breathing, mechanically ventilated Abd: soft, nontender GU: on HD at the time of my exam   Results for orders placed or performed during the hospital encounter of 02/13/20 (from the past 24 hour(s))  Type and screen [place]     Status: None (Preliminary result)   Collection Time: 04/05/20 10:36 AM  Result Value Ref Range   ABO/RH(D) O POS    Antibody Screen NEG    Sample Expiration 04/08/2020,2359    Unit Number [id number]    Blood Component Type RED CELLS,LR    Unit division 00    Status of Unit ISSUED    Transfusion Status OK TO TRANSFUSE    Crossmatch Result Compatible    Unit Number [id number]    Blood Component Type RED CELLS,LR    Unit division 00    Status of Unit ISSUED    Transfusion Status OK TO TRANSFUSE    Crossmatch Result       Compatible Performed at [facility], [address], [place], [place] [address]   Prepare RBC (crossmatch)     Status: None   Collection Time: 04/05/20 10:36 AM  Result Value Ref Range   Order Confirmation      ORDER PROCESSED BY BLOOD BANK Performed at [facility], [place] [address], [place], [place] [address]   Glucose, capillary     Status: Abnormal   Collection Time: 04/05/20 11:33 AM  Result Value Ref Range   Glucose-Capillary 138 (H) 70 - 99 mg/dL  Glucose, capillary     Status: Abnormal   Collection Time: 04/05/20  3:46 PM  Result Value Ref Range   Glucose-Capillary 129 (H) 70 - 99 mg/dL  Hemoglobin and hematocrit, blood     Status: Abnormal   Collection Time: 04/05/20  5:26 PM  Result Value Ref Range   Hemoglobin 7.2 (L) 12.0 - 15.0 g/dL   HCT 22.4 (L) 36 - 46 %  Glucose, capillary     Status: Abnormal   Collection Time: 04/05/20  7:42 PM  Result Value Ref Range   Glucose-Capillary 168 (H) 70 - 99 mg/dL  Glucose, capillary     Status: Abnormal   Collection Time: 04/05/20 11:56 PM  Result Value Ref Range   Glucose-Capillary 151 (H) 70 - 99 mg/dL  Glucose, capillary  Status: Abnormal   Collection Time: 04/06/20  3:29 AM  Result Value Ref Range   Glucose-Capillary 166 (H) 70 - 99 mg/dL  CBC     Status: Abnormal   Collection Time: 04/06/20  5:08 AM  Result Value Ref Range   WBC 20.0 (H) 4.0 - 10.5 K/uL   RBC 2.29 (L) 3.87 - 5.11 MIL/uL   Hemoglobin 6.5 (LL) 12.0 - 15.0 g/dL   HCT 20.3 (L) 36 - 46 %   MCV 88.6 80.0 - 100.0 fL   MCH 28.4 26.0 - 34.0 pg   MCHC 32.0 30.0 - 36.0 g/dL   RDW 17.2 (H) 11.5 - 15.5 %   Platelets 208 150 - 400 K/uL   nRBC 0.1 0.0 - 0.2 %  Basic metabolic panel     Status: Abnormal   Collection Time: 04/06/20  5:08 AM  Result Value Ref Range   Sodium 134 (L) 135 - 145 mmol/L   Potassium 5.4 (H) 3.5 - 5.1 mmol/L   Chloride 98 98 - 111 mmol/L   CO2 21 (L) 22 - 32 mmol/L   Glucose, Bld 199 (H) 70 - 99 mg/dL   BUN 58 (H) 6 -  20 mg/dL   Creatinine, Ser 4.33 (H) 0.44 - 1.00 mg/dL   Calcium 9.0 8.9 - 10.3 mg/dL   GFR, Estimated 12 (L) >60 mL/min   Anion gap 15 5 - 15  Prepare RBC (crossmatch)     Status: None   Collection Time: 04/06/20  6:16 AM  Result Value Ref Range   Order Confirmation      ORDER PROCESSED BY BLOOD BANK Performed at [facility], [address] [place], [place] [address]   Glucose, capillary     Status: Abnormal   Collection Time: 04/06/20  8:27 AM  Result Value Ref Range   Glucose-Capillary 151 (H) 70 - 99 mg/dL    Assessment & Plan:  Present on Admission: . COVID-19 . Diabetes mellitus type 2 in obese ([place]) . OSA (obstructive sleep apnea)    LOS: 52 days   Additional comments:I reviewed the patient's new clinical lab test results.   and I reviewed the patients new imaging test results.    Sacral wound - family has changed their mind about transitioning to comfort care. Plan for repeat operative debridement today, however unable to reach family for consent. Will try again later this morning.  FEN - NPO  [name], MD Trauma & General Surgery Please use [email or web address] to contact on call provider  04/06/2020  *Care during the described time interval was provided by me. I have reviewed this patient's available data, including medical history, events of note, physical examination and test results as part of my evaluation.

## 2020-04-06 NOTE — Progress Notes (Addendum)
[facility] Progress Note   OP HD:TTS 4h 39mn 450/800 2/2.25 bath 111.5kg Hep 2000 L AVF - darbe 50 ug q week, last 9/7 - calc 1.0 tiw - 9/9 Hb 10.0, tsat 22%  49yo female w/ ESRD on TTS HD admitted for COVID PNA on 02/13/20. She was intubated. SP CRRT around 9/26- 10/7. Now is on intermittent HD. She is sp trach. She had MSSA pna. DM2.  Assessment/ Plan:   1. COVID pna /sp trach -trying to wean from vent, not having success 2. Septic shock - on 2 pressors, dosing down a bit 3. Sacral decub w/ necrosis - sp I&D 11/2 by gen surg, broad spec abx IV 4. SP MSSA HCAP/ VAP 5. ESRD -usual HD TTS.sp CRRT 9/26- 10/7. - HD today off schedule, will keep MWF for now - UF likely small given pressor support  4. BP/ volume - on midodrine 10 tid, attempt to wean pressors for SBP > 90.  Has some dependent edema, wts up 108kg 5. HD access: LUA AVF clotted while here. TDC x2 here by IR.LIJ TC 6. 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 Phos 3.9 10/30 8. DM2 - per pmd  RKelly Splinter MD 04/06/2020, 2:07 PM      Subjective:   Seen in ICU, asleep   Objective:   BP 135/61    Pulse 99    Temp 99.1 F (37.3 C) (Axillary)    Resp (!) 26    Ht _0  (1.575 m)    Wt 108.6 kg    SpO2 100%    BMI 43.79 kg/m   Intake/Output Summary (Last 24 hours) at 04/06/2020 1407 Last data filed at 04/06/2020 1300 Gross per 24 hour  Intake 1964.51 ml  Output --  Net 1964.51 ml   Weight change: -0.2 kg  Physical Exam: More alert, NG in , +trach collar CVS-tachy ABD- BS present soft non-distended  EXT- no edema LE's, lt arm access thrombosed ACCESS: LIJ TC  Imaging: No results found.  Labs: BMET Recent Labs  Lab 03/31/20 0119 03/31/20 0119 04/01/20 0320 04/01/20 0320 04/02/20 0355 04/02/20 0355 04/03/20 0220 04/03/20 0774111/02/21 0821 04/03/20 0907 04/03/20 1023  04/03/20 1030 04/04/20 0423 04/05/20 0601 04/06/20 0508  NA 132*   < > 133*   < > 130*   < > 132*   < > 134* 133* 133* 133* 136 135 134*  K 5.2*   < > 4.3   < > 5.1   < > 4.4   < > 5.2* 5.0 4.8 4.8 4.9 4.2 5.4*  CL 92*   < > 93*  --  92*  --  93*  --   --   --  97*  --  100 98 98  CO2 24   < > 27  --  23  --  25  --   --   --  24  --  22 24 21*  GLUCOSE 156*   < > 255*  --  241*  --  163*  --   --   --  130*  --  167* 156* 199*  BUN 75*   < > 42*  --  80*  --  40*  --   --   --  47*  --  68* 48* 58*  CREATININE 5.40*   < > 3.05*  --  4.62*  --  2.58*  --   --   --  2.87*  --  3.90* 3.30* 4.33*  CALCIUM 10.6*   < > 10.2  --  10.5*  --  9.2  --   --   --  8.7*  --  9.6 9.4 9.0  PHOS 3.9  --   --   --   --   --  3.2  --   --   --   --   --  5.7*  --   --    < > = values in this interval not displayed.   CBC Recent Labs  Lab 04/03/20 1919 04/03/20 1919 04/04/20 0423 04/05/20 0601 04/05/20 1726 04/06/20 0508  WBC 23.0*  --  27.7* 22.6*  --  20.0*  HGB 8.0*   < > 7.7* 6.7* 7.2* 6.5*  HCT 24.3*   < > 22.9* 20.3* 22.4* 20.3*  MCV 86.5  --  85.4 86.4  --  88.6  PLT 222  --  208 218  --  208   < > = values in this interval not displayed.    Medications:     sodium chloride   Intravenous Once   B-complex with vitamin C  1 tablet Per Tube Daily   chlorhexidine gluconate (MEDLINE KIT)  15 mL Mouth Rinse BID   Chlorhexidine Gluconate Cloth  6 each Topical Q0600   clonazepam  0.5 mg Per Tube TID   collagenase   Topical Daily   darbepoetin (ARANESP) injection - DIALYSIS  150 mcg Intravenous Q Tue-HD   fiber  1 packet Per Tube BID   hydrocerin   Topical Daily   insulin aspart  0-20 Units Subcutaneous Q4H   insulin aspart  5 Units Subcutaneous Q4H   levothyroxine  125 mcg Per Tube Q0600   mouth rinse  15 mL Mouth Rinse 10 times per day   midodrine  10 mg Per Tube TID WC   oxyCODONE  10 mg Per Tube Q6H   pantoprazole sodium  40 mg Per Tube BID   polyethylene glycol   17 g Per Tube Daily   QUEtiapine  100 mg Per Tube BID   sodium chloride flush  10-40 mL Intracatheter Q12H   vancomycin variable dose per unstable renal function (pharmacist dosing)   Does not apply See admin instructions

## 2020-04-06 NOTE — Progress Notes (Signed)
Nutrition Follow-up  DOCUMENTATION CODES:   Morbid obesity  INTERVENTION:   Restart tube feeds via Cortrak: - Start Vital 1.5 @ 25 ml/hr and titrate by 10 ml/hr q 8 hours to goal rate of 55 ml/hr (1320 ml/day) - ProSource TF 45 ml TID  Tube feeding regimen at goal provides 2100 kcal, 122 grams of protein, and 1008 ml of H2O (meets 100% of needs).  NUTRITION DIAGNOSIS:   Inadequate oral intake related to inability to eat as evidenced by NPO status.  Ongoing  GOAL:   Patient will meet greater than or equal to 90% of their needs  Progressing  MONITOR:   Vent status, Labs, Weight trends, TF tolerance, Skin, I & O's  REASON FOR ASSESSMENT:   Consult Assessment of nutrition requirement/status  ASSESSMENT:   49 year old female with a past medical history significant for end-stage renal disease on hemodialysis MWF, chronic hypoxic respiratory failure on 3 L nasal cannula at baseline, sleep apnea, diabetes, hypertension, hyperlipidemia, hypothyroidism, and asthma presents with complaints of worsening shortness of breath. Previously diagnosed with COVID PNA on 9/9.  09/28 - start CRRT 10/04-cortrak placed; tip gastric 10/07 - end CRRT 10/08-pt reintubated 10/14-s/p iHD 10/18 - trach 11/02 - s/p I&D of sacrum, left gluteus  Discussed pt with RN and during ICU rounds. Pt with devastating necrotizing soft tissue infection. Per notes, family desires full aggressive care and would like additional operative intervention if indicated. Surgery attempting to get in contact with family for consent for repeat operative debridement today.  Discussed pt with CCM NP. Okay to restart tube feeds as pt is unlikely to go to OR today. Discussed with RN.  Pt continues on iHD. Per Nephrology note, plan is for HD today off schedule.  Admit weight: 124.7 kg Current weight: 108.6 kg EDW (per nephrology): 111.5 kg  Patient remains on ventilator support via trach. MV: 9.6 L/min Temp  (24hrs), Avg:99 F (37.2 C), Min:98.5 F (36.9 C), Max:100 F (37.8 C) BP (a-line): 117/48 MAP (a-line): 67  Drips: Levophed Vasopressin  Medications reviewed and include: B-complex with vitamin C, aranesp, nutrisource fiber BID, SSI q 4 hours, Novolog 5 units q 4 hours, protonix, miralax, IV abx  Labs reviewed: sodium 134, potassium 5.4, hemoglobin 6.5 CBG's: 129-168 x 24 hours  Rectal tube: 200 ml + 1 unmeasured occurrence x 24 hours I/O's: +23.0 L since admit  Diet Order:   Diet Order            Diet NPO time specified  Diet effective midnight                 EDUCATION NEEDS:   Not appropriate for education at this time  Skin:  Skin Assessment: Skin Integrity Issues: Stage II: right neck Unstageable: mid sacrum Incisions: buttocks  Last BM:  04/05/20 rectal tube  Height:   Ht Readings from Last 1 Encounters:  04/05/20 5\' 2"  (1.575 m)    Weight:   Wt Readings from Last 1 Encounters:  04/06/20 108.6 kg    Ideal Body Weight:  50 kg  BMI:  Body mass index is 43.79 kg/m.  Estimated Nutritional Needs:   Kcal:  2065-2260  Protein:  120-140 grams  Fluid:  1 L + UOP    Gaynell Face, MS, RD, LDN Inpatient Clinical Dietitian Please see AMiON for contact information.

## 2020-04-07 DIAGNOSIS — U071 COVID-19: Secondary | ICD-10-CM | POA: Diagnosis not present

## 2020-04-07 LAB — TYPE AND SCREEN
ABO/RH(D): O POS
Antibody Screen: NEGATIVE
Unit division: 0
Unit division: 0
Unit division: 0
Unit division: 0
Unit division: 0
Unit division: 0

## 2020-04-07 LAB — BPAM RBC
Blood Product Expiration Date: 202112062359
Blood Product Expiration Date: 202112072359
Blood Product Expiration Date: 202112072359
Blood Product Expiration Date: 202112082359
Blood Product Expiration Date: 202112112359
Blood Product Expiration Date: 202112112359
ISSUE DATE / TIME: 202111041343
ISSUE DATE / TIME: 202111050647
ISSUE DATE / TIME: 202111051221
ISSUE DATE / TIME: 202111051817
Unit Type and Rh: 5100
Unit Type and Rh: 5100
Unit Type and Rh: 5100
Unit Type and Rh: 5100
Unit Type and Rh: 5100
Unit Type and Rh: 5100

## 2020-04-07 LAB — GLUCOSE, CAPILLARY
Glucose-Capillary: 253 mg/dL — ABNORMAL HIGH (ref 70–99)
Glucose-Capillary: 271 mg/dL — ABNORMAL HIGH (ref 70–99)
Glucose-Capillary: 300 mg/dL — ABNORMAL HIGH (ref 70–99)
Glucose-Capillary: 267 mg/dL — ABNORMAL HIGH (ref 70–99)
Glucose-Capillary: 175 mg/dL — ABNORMAL HIGH (ref 70–99)
Glucose-Capillary: 175 mg/dL — ABNORMAL HIGH (ref 70–99)

## 2020-04-07 LAB — CBC
HCT: 22.1 % — ABNORMAL LOW (ref 36.0–46.0)
HCT: 20.4 % — ABNORMAL LOW (ref 36.0–46.0)
Hemoglobin: 7.2 g/dL — ABNORMAL LOW (ref 12.0–15.0)
Hemoglobin: 6.7 g/dL — CL (ref 12.0–15.0)
MCH: 28.5 pg (ref 26.0–34.0)
MCH: 28.4 pg (ref 26.0–34.0)
MCHC: 32.6 g/dL (ref 30.0–36.0)
MCHC: 32.8 g/dL (ref 30.0–36.0)
MCV: 87.4 fL (ref 80.0–100.0)
MCV: 86.4 fL (ref 80.0–100.0)
Platelets: 231 10*3/uL (ref 150–400)
Platelets: 250 10*3/uL (ref 150–400)
RBC: 2.53 MIL/uL — ABNORMAL LOW (ref 3.87–5.11)
RBC: 2.36 MIL/uL — ABNORMAL LOW (ref 3.87–5.11)
RDW: 16.4 % — ABNORMAL HIGH (ref 11.5–15.5)
RDW: 16.5 % — ABNORMAL HIGH (ref 11.5–15.5)
WBC: 18 10*3/uL — ABNORMAL HIGH (ref 4.0–10.5)
WBC: 18.9 10*3/uL — ABNORMAL HIGH (ref 4.0–10.5)
nRBC: 0 % (ref 0.0–0.2)
nRBC: 0 % (ref 0.0–0.2)

## 2020-04-07 LAB — HEMOGLOBIN AND HEMATOCRIT, BLOOD
HCT: 25.4 % — ABNORMAL LOW (ref 36.0–46.0)
Hemoglobin: 8.3 g/dL — ABNORMAL LOW (ref 12.0–15.0)

## 2020-04-07 LAB — BASIC METABOLIC PANEL
Anion gap: 13 (ref 5–15)
BUN: 35 mg/dL — ABNORMAL HIGH (ref 6–20)
CO2: 24 mmol/L (ref 22–32)
Calcium: 8.9 mg/dL (ref 8.9–10.3)
Chloride: 99 mmol/L (ref 98–111)
Creatinine, Ser: 3 mg/dL — ABNORMAL HIGH (ref 0.44–1.00)
GFR, Estimated: 18 mL/min — ABNORMAL LOW (ref >60)
Glucose, Bld: 285 mg/dL — ABNORMAL HIGH (ref 70–99)
Potassium: 4.1 mmol/L (ref 3.5–5.1)
Sodium: 136 mmol/L (ref 135–145)

## 2020-04-07 LAB — PREPARE RBC (CROSSMATCH)

## 2020-04-07 MED ORDER — SODIUM CHLORIDE 0.9% IV SOLUTION: Freq: Once | INTRAVENOUS | Status: AC

## 2020-04-07 MED ORDER — INSULIN DETEMIR 100 UNIT/ML ~~LOC~~ SOLN
16.0000 [IU] | Freq: Two times a day (BID) | SUBCUTANEOUS | Status: DC
  Administered 2020-04-07 (×2): 16 [IU] via SUBCUTANEOUS
  Filled 2020-04-07 (×4): qty 0.16

## 2020-04-07 NOTE — Progress Notes (Signed)
NAME:  Wanda Ramirez, MRN:  [id number], DOB:  11/01/1970, LOS: 34 ADMISSION DATE:  02/13/2020, CONSULTATION DATE:  9/24 REFERRING MD:  Heber [place], CHIEF COMPLAINT:  Dyspnea   Brief History   49 yo female admitted with COVID 19 pneumonia on 9/13, had been diagnosed with COVID on 9/9.  On 9/24 her condition worsened and PCCM was consulted, she was sent to the ICU and treated with BIPAP, then eventually intubated.  Started on CRRT through a tunneled HD cath. Extubated on 10/6 but re-intubated on 10/8.  Has been challenged by sedation needs since admission.   Past Medical History  Sleep apnea Morbid obesity Hypothyroidism Hypertension GERD  End-stage renal disease on HD MWF Diabetes Asthma  Significant Hospital Events   9/14 admit 9/25 on BiPAP with Precedex overnight 9/26 -unable to run CRRT with prone ventilation.  Resume supine ventilation but still unable to draw blood back. 9/27 transferred to 89m. IR replaced HD catheter 10/1 on CRRT and vasopressors 10/6 - extubated 10/7 - had some stridor and increased WOB so placed on BIPAP and dexamethasone given.  10/15 weaning sedation. midaz off 10/16 move to 60M 10/18 s/p 6 proximal XLT Shiley tracheostomy  10/19: Resumed Eliquis low dose. Per Dr. Thompson Caul note 10/7 will need to be on [facility] for 1 month then reasses 10/19: Episode of hypoglycemia 55. 10/21 hypoglycemic again. levemir and tubefeed coverage decreased. Low grade temp->sputum culture send had vomiting event on 10/19 w/ concern for aspiration. Fent was stopped 10/20. Still gets anxious at night. Required increased precedex. Added clonazepam. 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 11/2 Surgical debridement   Consults:   Nephrology  Procedures:  9/23 tunneled HD cath placement >  10/8 ETT > 10/18 10/18 6 proximal XLT Shiley tracheostomy  >  Significant Diagnostic Tests:  9/21 Vas ultrasound> thrombosed LUE fistula 9/21 VQ scan > negative for PE 9/29 echo> LVEF 60-65%, flat ventrcile consistent with RV pressure overload, RV severely enlarged, moderate reduction in RV function  Micro Data:  9/13 blood > neg   9/14 sars cov 2 > pos 9/26 blood > neg 10/8 resp > MSSA 10/8 blood > negative  10/21 sputum > Normal flora  10/25 Blood > ngtd x 3 days 10/25 Sputum  > few diphtheroids  10/25 Blood >>negative 10/29 Sputum >> Staph aureus  Antimicrobials:  9/14 remdesivir > 9/19 9/15 tocilizumab > x1 9/20 cefepime > 9/29 9/20 azithro >  9/25 10/8 vanc > 10/9 10/8 zosyn > x1 10/10 ceftriaxone > 10/16 10/31 vancomycin >>   Interim history/subjective:  Wound change last night revealed significant bloody drainage. Remains on levophed.   Objective   Blood pressure (!) 106/49, pulse 100, temperature (!) 100.9 F (38.3 C), temperature source Oral, resp. rate (!) 23, height 5\' 2"  (1.575 m), weight 109.6 kg, SpO2 97 %.    Vent Mode: CPAP;PSV FiO2 (%):  [30 %-40 %] 30 % Set Rate:  [16 bmp] 16 bmp Vt Set:  [400 mL] 400 mL PEEP:  [5 cmH20] 5 cmH20   Intake/Output Summary (Last 24 hours) at 04/07/2020 1706 Last data filed at 04/07/2020 1400 Gross per 24 hour  Intake 1370.83 ml  Output 100 ml  Net 1270.83 ml   Filed Weights   04/06/20 0500 04/06/20 1194  04/07/20 0449  Weight: 108.6 kg 108.6 kg 109.6 kg   Examination: General: Chronically ill appearing elderly female lying in bed on mechanical ventilation, in NAD HEENT: 6cuffed proximal XLT trach,, MM pink/moist, PERRL,  Neuro: Opens eyes to verbal stimuli, unable to follow commands  CV: s1s2 regular rate and rhythm, no murmur, rubs, or gallops,  PULM:  Rhonchi bilaterally, no increased work of breathing, tolerating vent well  GI: soft, bowel  sounds active in all 4 quadrants, non-tender, non-distended, tolerating TF Extremities: warm/dry, no edema  Skin: sacral wound with packing  Resolved Hospital Problem list   Thrombocytopenia  MSSA PNA  Nausea  Agitated delirium  Assessment & Plan:   Acute hypoxic respiratory failure from COVID 19 pneumonia with ARDS complicated by MSSA PNA. Failure to wean s/p tracheostomy. P: Continue ventilator support with lung protective strategies  Wean PEEP and FiO2 for sats greater than 90%. Head of bed elevated 30 degrees. Plateau pressures less than 30 cm H20.  Follow intermittent chest x-ray and ABG.   Ensure adequate pulmonary hygiene  VAP bundle in place  PAD protocol Continue IV antibiotics   Shock: septic Necrotizing infection of soft tissue surrounding sacral wound -Patient began displaying evidence of sepsis with shock physiology 11/1 prompting her evaluation including CT scan that revealed gas surrounding soft tissue prompting surgical consult P: Continue aggressive measures per family request Further debridement planned 11/5 by surgery but unable to contact family Continue empiric antibiotics, DC vanc today Continue pressor support   ESRD P: Receiving iHD  Nephrology following, appreciate assistance  Follow renal function  Trend Bmet  Enteral hydration   Staph tracheobronchitis vs pneumonia.   -MSSA P: Treatment as above  Encourage pulmonary hygiene  Vent support as above   Acute metabolic encephalopathy in setting of sepsis P: Well controlled on current regiment  RASS goal 0 Continue Klonipin, Oxycodone, and Seroquel   Anemia  -r/t to blood loss from sacrum and anemia of critical illness. S/p 7 units PRBC P: Trend CBC  Transfuse per protocol  Wound care   Hypothyroidism P: Continue synthroid   DM type 2 with labile blood sugars. P: Continue SSI, Lantus, and tube feeds CBG checks q4hrs  Dysphagia. - Family would no like to purse PEG placement,  IR notified and will continue to follow patients clinical course for availability to safely place PEG tube   Pressure ulcer -Unstageable sacral ulcer stage II right neck presure injury  P: Wound care  Pressure alleviating devices  Frequent turns   [place] Family discussion 11/2. Dr. Jonnie Finner and Dr. Bobbye Morton present during this conversation family elects for DNR and transition to comfort care.  However on further discussion 11/3 with both daughters identifying family has decided to pursue current aggressive measures and would like additional operative intervention if indicated.  Will discuss with general surgery regarding surgical plans.  Patient continues to remain DO NOT RESUSCITATE.  Best practice:  Diet: tube feeding DVT prophylaxis: Heparin  GI prophylaxis: Protonix Mobility: Bed rest Code Status: DNR Family discussion: Daughters updated over the phone on 11/5 Disposition: ICU  Labs:   CMP Latest Ref Rng & Units 04/07/2020 04/06/2020 04/05/2020  Glucose 70 - 99 mg/dL 285(H) 199(H) 156(H)  BUN 6 - 20 mg/dL 35(H) 58(H) 48(H)  Creatinine 0.44 - 1.00 mg/dL 3.00(H) 4.33(H) 3.30(H)  Sodium 135 - 145 mmol/L 136 134(L) 135  Potassium 3.5 - 5.1 mmol/L 4.1 5.4(H) 4.2  Chloride 98 - 111 mmol/L 99 98 98  CO2 22 - 32  mmol/L 24 21(L) 24  Calcium 8.9 - 10.3 mg/dL 8.9 9.0 9.4  Total Protein 6.5 - 8.1 g/dL - - -  Total Bilirubin 0.3 - 1.2 mg/dL - - -  Alkaline Phos 38 - 126 U/L - - -  AST 15 - 41 U/L - - -  ALT 0 - 44 U/L - - -    CBC Latest Ref Rng & Units 04/07/2020 04/06/2020 04/06/2020  WBC 4.0 - 10.5 K/uL 18.0(H) - -  Hemoglobin 12.0 - 15.0 g/dL 7.2(L) 8.3(L) 6.5(LL)  Hematocrit 36 - 46 % 22.1(L) 25.4(L) 20.0(L)  Platelets 150 - 400 K/uL 231 - -    ABG    Component Value Date/Time   PHART 7.250 (L) 04/03/2020 1030   PCO2ART 64.8 (H) 04/03/2020 1030   PO2ART 156 (H) 04/03/2020 1030   HCO3 28.4 (H) 04/03/2020 1030   TCO2 30 04/03/2020 1030   ACIDBASEDEF 2.0 04/03/2020 0907   O2SAT  99.0 04/03/2020 1030    CBG (last 3)  Recent Labs    04/07/20 0828 04/07/20 1259 04/07/20 1524  GLUCAP 271* 300* 267*    Critical care time: [place], MD [place] [facility] Office: [phone]   See Amion for Pager Details

## 2020-04-07 NOTE — Progress Notes (Signed)
[facility] Progress Note   OP HD:TTS 4h 34mn 450/800 2/2.25 bath 111.5kg Hep 2000 L AVF - darbe 50 ug q week, last 9/7 - calc 1.0 tiw - 9/9 Hb 10.0, tsat 22%  49yo female w/ ESRD on TTS HD admitted for COVID PNA on 02/13/20. She was intubated. SP CRRT around 9/26- 10/7. Now is on intermittent HD. She is sp trach. She had MSSA pna. DM2.  Assessment/ Plan:   1. COVID pna /sp trach - on vent, per ccm 2. Septic shock - remains on pressor support 3. Sacral decub w/ necrosis - sp I&D 11/2 by gen surg, broad spec abx IV. WBC down, still having low grade temps.  4. SP MSSA HCAP/ VAP 5. ESRD -usual HD TTS.sp CRRT 9/26- 10/7. - next HD monday, will keep on MWF sched for now  4. BP/ volume - on midodrine 10 tid, attempt to wean pressors for SBP > 90.  Has some dependent edema, wts up 108kg 5. HD access: LUA AVF clotted while here. TDC x2 here by IR.LIJ TC 6. 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 Phos 3.9 10/30 8. DM2 - per pmd  RKelly Splinter MD 04/07/2020, 12:41 PM      Subjective:   Seen in ICU, asleep   Objective:   BP (!) 116/50   Pulse (!) 108   Temp (!) 100.4 F (38 C) (Axillary) Comment: RN is aware  Resp (!) 27   Ht '5\' 2"'  (1.575 m)   Wt 109.6 kg   SpO2 95%   BMI 44.19 kg/m   Intake/Output Summary (Last 24 hours) at 04/07/2020 1241 Last data filed at 04/07/2020 1200 Gross per 24 hour  Intake 1460.98 ml  Output 100 ml  Net 1360.98 ml   Weight change: 1 kg  Physical Exam: More alert, NG in , +trach collar CVS-tachy ABD- BS present soft non-distended  EXT- no edema LE's, lt arm access thrombosed ACCESS: LIJ TC  Imaging: No results found.  Labs: BMET Recent Labs  Lab 04/02/20 0355 04/02/20 0355 04/03/20 0220 04/03/20 0774111/02/21 0907 04/03/20 1023 04/03/20 1030 04/04/20 0423 04/05/20 0601 04/06/20 0508 04/07/20 0416  NA 130*    < > 132*   < > 133* 133* 133* 136 135 134* 136  K 5.1   < > 4.4   < > 5.0 4.8 4.8 4.9 4.2 5.4* 4.1  CL 92*  --  93*  --   --  97*  --  100 98 98 99  CO2 23  --  25  --   --  24  --  22 24 21* 24  GLUCOSE 241*  --  163*  --   --  130*  --  167* 156* 199* 285*  BUN 80*  --  40*  --   --  47*  --  68* 48* 58* 35*  CREATININE 4.62*  --  2.58*  --   --  2.87*  --  3.90* 3.30* 4.33* 3.00*  CALCIUM 10.5*  --  9.2  --   --  8.7*  --  9.6 9.4 9.0 8.9  PHOS  --   --  3.2  --   --   --   --  5.7*  --   --   --    < > = values in this interval not displayed.   CBC Recent Labs  Lab 04/04/20 0423 04/04/20 0423 04/05/20 0601 04/05/20 1726 04/06/20 0508 04/06/20 1646 04/06/20 2320 04/07/20 0416  WBC 27.7*  --  22.6*  --  20.0*  --   --  18.0*  HGB 7.7*   < > 6.7*   < > 6.5* 6.5* 8.3* 7.2*  HCT 22.9*   < > 20.3*   < > 20.3* 20.0* 25.4* 22.1*  MCV 85.4  --  86.4  --  88.6  --   --  87.4  PLT 208  --  218  --  208  --   --  231   < > = values in this interval not displayed.    Medications:    . sodium chloride   Intravenous Once  . sodium chloride   Intravenous Once  . B-complex with vitamin C  1 tablet Per Tube Daily  . chlorhexidine gluconate (MEDLINE KIT)  15 mL Mouth Rinse BID  . Chlorhexidine Gluconate Cloth  6 each Topical Q0600  . clonazepam  0.5 mg Per Tube TID  . collagenase   Topical Daily  . darbepoetin (ARANESP) injection - DIALYSIS  150 mcg Intravenous Q Tue-HD  . feeding supplement (PROSource TF)  45 mL Per Tube TID  . fiber  1 packet Per Tube BID  . hydrocerin   Topical Daily  . insulin aspart  0-20 Units Subcutaneous Q4H  . insulin aspart  5 Units Subcutaneous Q4H  . insulin detemir  16 Units Subcutaneous BID  . levothyroxine  125 mcg Per Tube Q0600  . mouth rinse  15 mL Mouth Rinse 10 times per day  . midodrine  10 mg Per Tube TID WC  . oxyCODONE  10 mg Per Tube Q6H  . pantoprazole sodium  40 mg Per Tube BID  . polyethylene glycol  17 g Per Tube Daily  . QUEtiapine   100 mg Per Tube BID  . sodium chloride flush  10-40 mL Intracatheter Q12H

## 2020-04-07 NOTE — Progress Notes (Signed)
Patient left in PS/CPAP mode at this time due to patient being more comfortable and synchronous. RT will continue to monitor.

## 2020-04-07 NOTE — Progress Notes (Signed)
CRITICAL VALUE ALERT  Critical Value:  Hgb 6.7  Date & Time Notied:  04/07/20 1830  Provider Notified: Dr. Erin Fulling  Orders Received/Actions taken: 2 PRBC ordered.    Irven [place], RN

## 2020-04-08 DIAGNOSIS — U071 COVID-19: Secondary | ICD-10-CM | POA: Diagnosis not present

## 2020-04-08 LAB — AEROBIC/ANAEROBIC CULTURE W GRAM STAIN (SURGICAL/DEEP WOUND)

## 2020-04-08 LAB — CULTURE, BLOOD (ROUTINE X 2)
Culture: NO GROWTH
Culture: NO GROWTH

## 2020-04-08 LAB — HEMOGLOBIN AND HEMATOCRIT, BLOOD
HCT: 26.8 % — ABNORMAL LOW (ref 36.0–46.0)
Hemoglobin: 9 g/dL — ABNORMAL LOW (ref 12.0–15.0)

## 2020-04-08 LAB — GLUCOSE, CAPILLARY
Glucose-Capillary: 160 mg/dL — ABNORMAL HIGH (ref 70–99)
Glucose-Capillary: 208 mg/dL — ABNORMAL HIGH (ref 70–99)
Glucose-Capillary: 217 mg/dL — ABNORMAL HIGH (ref 70–99)
Glucose-Capillary: 229 mg/dL — ABNORMAL HIGH (ref 70–99)
Glucose-Capillary: 235 mg/dL — ABNORMAL HIGH (ref 70–99)
Glucose-Capillary: 261 mg/dL — ABNORMAL HIGH (ref 70–99)

## 2020-04-08 LAB — BASIC METABOLIC PANEL
Anion gap: 15 (ref 5–15)
BUN: 56 mg/dL — ABNORMAL HIGH (ref 6–20)
CO2: 23 mmol/L (ref 22–32)
Calcium: 8.8 mg/dL — ABNORMAL LOW (ref 8.9–10.3)
Chloride: 101 mmol/L (ref 98–111)
Creatinine, Ser: 4.33 mg/dL — ABNORMAL HIGH (ref 0.44–1.00)
GFR, Estimated: 12 mL/min — ABNORMAL LOW (ref >60)
Glucose, Bld: 291 mg/dL — ABNORMAL HIGH (ref 70–99)
Potassium: 4.4 mmol/L (ref 3.5–5.1)
Sodium: 139 mmol/L (ref 135–145)

## 2020-04-08 LAB — MRSA PCR SCREENING: MRSA by PCR: NEGATIVE

## 2020-04-08 MED ORDER — INSULIN DETEMIR 100 UNIT/ML ~~LOC~~ SOLN
20.0000 [IU] | Freq: Two times a day (BID) | SUBCUTANEOUS | Status: DC
  Administered 2020-04-08 – 2020-04-10 (×5): 20 [IU] via SUBCUTANEOUS
  Filled 2020-04-08 (×7): qty 0.2

## 2020-04-08 NOTE — Progress Notes (Signed)
elink assisting virtual visit for family

## 2020-04-08 NOTE — Progress Notes (Signed)
NAME:  Wanda Ramirez, MRN:  [id number], DOB:  07/19/1970, LOS: 4 ADMISSION DATE:  02/13/2020, CONSULTATION DATE:  9/24 REFERRING MD:  Heber [place], CHIEF COMPLAINT:  Dyspnea   Brief History   49 yo female admitted with COVID 19 pneumonia on 9/13, had been diagnosed with COVID on 9/9.  On 9/24 her condition worsened and PCCM was consulted, she was sent to the ICU and treated with BIPAP, then eventually intubated.  Started on CRRT through a tunneled HD cath. Extubated on 10/6 but re-intubated on 10/8.  Has been challenged by sedation needs since admission.   Past Medical History  Sleep apnea Morbid obesity Hypothyroidism Hypertension GERD  End-stage renal disease on HD MWF Diabetes Asthma  Significant Hospital Events   9/14 admit 9/25 on BiPAP with Precedex overnight 9/26 -unable to run CRRT with prone ventilation.  Resume supine ventilation but still unable to draw blood back. 9/27 transferred to 65m. IR replaced HD catheter 10/1 on CRRT and vasopressors 10/6 - extubated 10/7 - had some stridor and increased WOB so placed on BIPAP and dexamethasone given.  10/15 weaning sedation. midaz off 10/16 move to 6M 10/18 s/p 6 proximal XLT Shiley tracheostomy  10/19: Resumed Eliquis low dose. Per Dr. Thompson Caul note 10/7 will need to be on [facility] for 1 month then reasses 10/19: Episode of hypoglycemia 55. 10/21 hypoglycemic again. levemir and tubefeed coverage decreased. Low grade temp->sputum culture send had vomiting event on 10/19 w/ concern for aspiration. Fent was stopped 10/20. Still gets anxious at night. Required increased precedex. Added clonazepam. 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 11/2 Surgical debridement   Consults:   Nephrology  Procedures:  9/23 tunneled HD cath placement >  10/8 ETT > 10/18 10/18 6 proximal XLT Shiley tracheostomy  >  Significant Diagnostic Tests:  9/21 Vas ultrasound> thrombosed LUE fistula 9/21 VQ scan > negative for PE 9/29 echo> LVEF 60-65%, flat ventrcile consistent with RV pressure overload, RV severely enlarged, moderate reduction in RV function  Micro Data:  9/13 blood > neg   9/14 sars cov 2 > pos 9/26 blood > neg 10/8 resp > MSSA 10/8 blood > negative  10/21 sputum > Normal flora  10/25 Blood > ngtd x 3 days 10/25 Sputum  > few diphtheroids  10/25 Blood >>negative 10/29 Sputum >> Staph aureus  Antimicrobials:  9/14 remdesivir > 9/19 9/15 tocilizumab > x1 9/20 cefepime > 9/29 9/20 azithro >  9/25 10/8 vanc > 10/9 10/8 zosyn > x1 10/10 ceftriaxone > 10/16 10/31 vancomycin >>   Interim history/subjective:  NAEON. NE weaned off.    Objective   Blood pressure 124/62, pulse (!) 115, temperature (!) 100.6 F (38.1 C), temperature source Axillary, resp. rate (!) 27, height 5\' 2"  (1.575 m), weight 110.6 kg, SpO2 95 %.    Vent Mode: CPAP;PSV FiO2 (%):  [30 %] 30 % PEEP:  [5 cmH20] 5 cmH20 Pressure Support:  [5 cmH20] 5 cmH20   Intake/Output Summary (Last 24 hours) at 04/08/2020 1445 Last data filed at 04/08/2020 1256 Gross per 24 hour  Intake 990.91 ml  Output 400 ml  Net 590.91 ml   Filed Weights   04/06/20 0652 04/07/20 0449 04/08/20 0500  Weight: 108.6 kg 109.6 kg 110.6 kg   Examination: General:  Chronically ill appearing elderly female lying in bed on mechanical ventilation, in NAD HEENT: 6cuffed proximal XLT trach,, MM pink/moist, PERRL,  Neuro: Opens eyes to verbal stimuli, unable to follow commands  CV: s1s2 regular rate and rhythm, no murmur, rubs, or gallops,  PULM:  Rhonchi bilaterally, no increased work of breathing, tolerating vent well  GI: soft, bowel sounds active in all 4 quadrants, non-tender, non-distended, tolerating  TF Extremities: warm/dry, no edema  Skin: sacral wound with packing  Resolved Hospital Problem list   Thrombocytopenia  MSSA PNA  Nausea  Agitated delirium  Assessment & Plan:   Acute hypoxic respiratory failure from COVID 19 pneumonia with ARDS complicated by MSSA PNA. Failure to wean s/p tracheostomy. P: S/p trach PSV, TC trials as tolerated  Shock: septic Necrotizing infection of soft tissue surrounding sacral wound -Patient began displaying evidence of sepsis with shock physiology 11/1 prompting her evaluation including CT scan that revealed gas surrounding soft tissue prompting surgical consult P: Continue aggressive measures per family request Further debridement planned 11/5 by surgery but unable to contact family Continue empiric antibiotics Pressors weaned off 11/7  ESRD P: Receiving iHD  Nephrology following, appreciate assistance  Follow renal function  Trend Bmet  Enteral hydration   Acute metabolic encephalopathy in setting of sepsis P: Well controlled on current regiment  RASS goal 0 Continue Klonipin, Oxycodone, and Seroquel   Anemia  -r/t to blood loss from sacrum and anemia of critical illness. S/p 7 units PRBC P: Trend CBC  Transfuse per protocol  Wound care   Hypothyroidism P: Continue synthroid   DM type 2 with labile blood sugars. P: Continue SSI, Lantus, and tube feeds CBG checks q4hrs  Dysphagia. - Family would no like to purse PEG placement, IR notified and will continue to follow patients clinical course for availability to safely place PEG tube   Pressure ulcer -Unstageable sacral ulcer stage II right neck presure injury  P: Wound care  Pressure alleviating devices  Frequent turns   [place] Family discussion 11/2. Dr. Jonnie Finner and Dr. Bobbye Morton present during this conversation family elects for DNR and transition to comfort care.  However on further discussion 11/3 with both daughters identifying family has decided to pursue  current aggressive measures and would like additional operative intervention if indicated.  Will discuss with general surgery regarding surgical plans.  Patient continues to remain DO NOT RESUSCITATE.  Best practice:  Diet: tube feeding DVT prophylaxis: Heparin  GI prophylaxis: Protonix Mobility: Bed rest Code Status: DNR Family discussion: as able Disposition: ICU  Labs:   CMP Latest Ref Rng & Units 04/08/2020 04/07/2020 04/06/2020  Glucose 70 - 99 mg/dL 291(H) 285(H) 199(H)  BUN 6 - 20 mg/dL 56(H) 35(H) 58(H)  Creatinine 0.44 - 1.00 mg/dL 4.33(H) 3.00(H) 4.33(H)  Sodium 135 - 145 mmol/L 139 136 134(L)  Potassium 3.5 - 5.1 mmol/L 4.4 4.1 5.4(H)  Chloride 98 - 111 mmol/L 101 99 98  CO2 22 - 32 mmol/L 23 24 21(L)  Calcium 8.9 - 10.3 mg/dL 8.8(L) 8.9 9.0  Total Protein 6.5 - 8.1 g/dL - - -  Total Bilirubin 0.3 - 1.2 mg/dL - - -  Alkaline Phos 38 - 126 U/L - - -  AST 15 - 41 U/L - - -  ALT 0 - 44 U/L - - -    CBC Latest Ref Rng & Units 04/08/2020 04/07/2020 04/07/2020  WBC 4.0 - 10.5 K/uL - 18.9(H) 18.0(H)  Hemoglobin 12.0 - 15.0 g/dL 9.0(L) 6.7(LL) 7.2(L)  Hematocrit 36 - 46 % 26.8(L) 20.4(L) 22.1(L)  Platelets 150 - 400 K/uL - 250 231    ABG    Component Value Date/Time   PHART 7.250 (L) 04/03/2020 1030   PCO2ART 64.8 (H) 04/03/2020 1030   PO2ART 156 (H) 04/03/2020 1030   HCO3 28.4 (H) 04/03/2020 1030   TCO2 30 04/03/2020 1030   ACIDBASEDEF 2.0 04/03/2020 0907   O2SAT 99.0 04/03/2020 1030    CBG (last 3)  Recent Labs    04/08/20 0339 04/08/20 0831 04/08/20 1235  GLUCAP 160* 208* 261*    Critical care time:    CRITICAL CARE Performed by: Bonna Gains Carolyn Sylvia   Total critical care time: 30 minutes  Critical care time was exclusive of separately billable procedures and treating other patients.  Critical care was necessary to treat or prevent imminent or life-threatening deterioration.  Critical care was time spent personally by me on the following  activities: development of treatment plan with patient and/or surrogate as well as nursing, discussions with consultants, evaluation of patient's response to treatment, examination of patient, obtaining history from patient or surrogate, ordering and performing treatments and interventions, ordering and review of laboratory studies, ordering and review of radiographic studies, pulse oximetry and re-evaluation of patient's condition.

## 2020-04-08 NOTE — Progress Notes (Signed)
Assisted tele visit to patient with daughter.  Sigmund Hazel, RN

## 2020-04-08 NOTE — Progress Notes (Signed)
[facility] Progress Note   OP HD:TTS 4h 90mn 450/800 2/2.25 bath 111.5kg Hep 2000 L AVF - darbe 50 ug q week, last 9/7 - calc 1.0 tiw - 9/9 Hb 10.0, tsat 22%  49yo female w/ ESRD on TTS HD admitted for COVID PNA on 02/13/20. She was intubated. SP CRRT around 9/26- 10/7. Now is on intermittent HD. She is sp trach. She had MSSA pna. DM2.  Assessment/ Plan:   1. COVID pna /sp trach - on vent, per ccm 2. Septic shock - pressors down, almost off today 3. Sacral decub w/ dermal necrosis - sp I&D 11/2 and again 11/5 by gen surg. Getting broad spec abx IV.  4. SP MSSA HCAP/ VAP 5. ESRD -usual HD TTS.sp CRRT 9/26- 10/7. - next HD monday, will keep on MWF sched for now  4. BP/ volume - on midodrine 10 tid, weaning pressors almost off. Some dependent hip edema bilat. Wt's up 110kg. UF w/ HD tomorrow as tolerated.  5. HD access: LUA AVF clotted while here. TDC x2 here by IR.LIJ TC 6. 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 Phos 3.9 10/30 8. DM2 - per pmd  RKelly Splinter MD 04/08/2020, 10:28 AM      Subjective:   Seen in ICU, asleep   Objective:   BP (!) 130/58   Pulse (!) 104   Temp (!) 100.6 F (38.1 C) (Axillary) Comment: RN is aware  Resp (!) 23   Ht '5\' 2"'  (1.575 m)   Wt 110.6 kg   SpO2 99%   BMI 44.60 kg/m   Intake/Output Summary (Last 24 hours) at 04/08/2020 1028 Last data filed at 04/08/2020 0600 Gross per 24 hour  Intake 770.23 ml  Output 400 ml  Net 370.23 ml   Weight change: 1 kg  Physical Exam: More alert, NG in , +trach collar CVS-tachy ABD- BS present soft non-distended  EXT- no edema LE's, lt arm access thrombosed ACCESS: LIJ TC  Imaging: No results found.  Labs: BMET Recent Labs  Lab 04/02/20 0355 04/02/20 0355 04/03/20 0220 04/03/20 0299311/02/21 0907 04/03/20 1023 04/03/20 1030 04/04/20 0423 04/05/20 0601 04/06/20 0508  04/07/20 0416  NA 130*   < > 132*   < > 133* 133* 133* 136 135 134* 136  K 5.1   < > 4.4   < > 5.0 4.8 4.8 4.9 4.2 5.4* 4.1  CL 92*  --  93*  --   --  97*  --  100 98 98 99  CO2 23  --  25  --   --  24  --  22 24 21* 24  GLUCOSE 241*  --  163*  --   --  130*  --  167* 156* 199* 285*  BUN 80*  --  40*  --   --  47*  --  68* 48* 58* 35*  CREATININE 4.62*  --  2.58*  --   --  2.87*  --  3.90* 3.30* 4.33* 3.00*  CALCIUM 10.5*  --  9.2  --   --  8.7*  --  9.6 9.4 9.0 8.9  PHOS  --   --  3.2  --   --   --   --  5.7*  --   --   --    < > = values in this interval not displayed.   CBC Recent  Labs  Lab 04/05/20 0601 04/05/20 1726 04/06/20 0508 04/06/20 1646 04/06/20 2320 04/07/20 0416 04/07/20 1820 04/08/20 0545  WBC 22.6*  --  20.0*  --   --  18.0* 18.9*  --   HGB 6.7*   < > 6.5*   < > 8.3* 7.2* 6.7* 9.0*  HCT 20.3*   < > 20.3*   < > 25.4* 22.1* 20.4* 26.8*  MCV 86.4  --  88.6  --   --  87.4 86.4  --   PLT 218  --  208  --   --  231 250  --    < > = values in this interval not displayed.    Medications:    . sodium chloride   Intravenous Once  . sodium chloride   Intravenous Once  . B-complex with vitamin C  1 tablet Per Tube Daily  . chlorhexidine gluconate (MEDLINE KIT)  15 mL Mouth Rinse BID  . Chlorhexidine Gluconate Cloth  6 each Topical Q0600  . clonazepam  0.5 mg Per Tube TID  . collagenase   Topical Daily  . darbepoetin (ARANESP) injection - DIALYSIS  150 mcg Intravenous Q Tue-HD  . feeding supplement (PROSource TF)  45 mL Per Tube TID  . fiber  1 packet Per Tube BID  . hydrocerin   Topical Daily  . insulin aspart  0-20 Units Subcutaneous Q4H  . insulin aspart  5 Units Subcutaneous Q4H  . insulin detemir  20 Units Subcutaneous BID  . levothyroxine  125 mcg Per Tube Q0600  . mouth rinse  15 mL Mouth Rinse 10 times per day  . midodrine  10 mg Per Tube TID WC  . oxyCODONE  10 mg Per Tube Q6H  . pantoprazole sodium  40 mg Per Tube BID  . polyethylene glycol  17 g Per  Tube Daily  . QUEtiapine  100 mg Per Tube BID  . sodium chloride flush  10-40 mL Intracatheter Q12H

## 2020-04-09 DIAGNOSIS — U071 COVID-19: Secondary | ICD-10-CM | POA: Diagnosis not present

## 2020-04-09 LAB — TYPE AND SCREEN
ABO/RH(D): O POS
Antibody Screen: NEGATIVE
Unit division: 0
Unit division: 0

## 2020-04-09 LAB — BPAM RBC
Blood Product Expiration Date: 202112112359
Blood Product Expiration Date: 202112112359
ISSUE DATE / TIME: 202111062348
ISSUE DATE / TIME: 202111070217
Unit Type and Rh: 5100
Unit Type and Rh: 5100

## 2020-04-09 LAB — CBC
HCT: 25.8 % — ABNORMAL LOW (ref 36.0–46.0)
Hemoglobin: 8.3 g/dL — ABNORMAL LOW (ref 12.0–15.0)
MCH: 28.4 pg (ref 26.0–34.0)
MCHC: 32.2 g/dL (ref 30.0–36.0)
MCV: 88.4 fL (ref 80.0–100.0)
Platelets: 303 10*3/uL (ref 150–400)
RBC: 2.92 MIL/uL — ABNORMAL LOW (ref 3.87–5.11)
RDW: 16.5 % — ABNORMAL HIGH (ref 11.5–15.5)
WBC: 16.9 10*3/uL — ABNORMAL HIGH (ref 4.0–10.5)
nRBC: 0 % (ref 0.0–0.2)

## 2020-04-09 LAB — PROTIME-INR
INR: 1.2 (ref 0.8–1.2)
Prothrombin Time: 14.9 seconds (ref 11.4–15.2)

## 2020-04-09 LAB — GLUCOSE, CAPILLARY
Glucose-Capillary: 140 mg/dL — ABNORMAL HIGH (ref 70–99)
Glucose-Capillary: 197 mg/dL — ABNORMAL HIGH (ref 70–99)
Glucose-Capillary: 204 mg/dL — ABNORMAL HIGH (ref 70–99)
Glucose-Capillary: 223 mg/dL — ABNORMAL HIGH (ref 70–99)
Glucose-Capillary: 227 mg/dL — ABNORMAL HIGH (ref 70–99)
Glucose-Capillary: 229 mg/dL — ABNORMAL HIGH (ref 70–99)

## 2020-04-09 LAB — RENAL FUNCTION PANEL
Albumin: 1.2 g/dL — ABNORMAL LOW (ref 3.5–5.0)
Anion gap: 19 — ABNORMAL HIGH (ref 5–15)
BUN: 75 mg/dL — ABNORMAL HIGH (ref 6–20)
CO2: 20 mmol/L — ABNORMAL LOW (ref 22–32)
Calcium: 9 mg/dL (ref 8.9–10.3)
Chloride: 101 mmol/L (ref 98–111)
Creatinine, Ser: 5.3 mg/dL — ABNORMAL HIGH (ref 0.44–1.00)
GFR, Estimated: 9 mL/min — ABNORMAL LOW (ref >60)
Glucose, Bld: 249 mg/dL — ABNORMAL HIGH (ref 70–99)
Phosphorus: 9.2 mg/dL — ABNORMAL HIGH (ref 2.5–4.6)
Potassium: 4.7 mmol/L (ref 3.5–5.1)
Sodium: 140 mmol/L (ref 135–145)

## 2020-04-09 MED ORDER — HEPARIN SODIUM (PORCINE) 1000 UNIT/ML DIALYSIS: 2000.0000 [IU] | Freq: Once | INTRAMUSCULAR | Status: DC

## 2020-04-09 MED ORDER — HEPARIN SODIUM (PORCINE) 5000 UNIT/ML IJ SOLN
5000.0000 [IU] | Freq: Three times a day (TID) | INTRAMUSCULAR | Status: DC
  Administered 2020-04-09 – 2020-04-12 (×9): 5000 [IU] via SUBCUTANEOUS
  Filled 2020-04-09 (×9): qty 1

## 2020-04-09 MED ORDER — HEPARIN SODIUM (PORCINE) 1000 UNIT/ML DIALYSIS
2000.0000 [IU] | Freq: Once | INTRAMUSCULAR | Status: AC
  Administered 2020-04-09: 2000 [IU] via INTRAVENOUS_CENTRAL

## 2020-04-09 NOTE — Progress Notes (Signed)
NAME:  Wanda Ramirez, MRN:  [id number], DOB:  08-27-1970, LOS: 31 ADMISSION DATE:  02/13/2020, CONSULTATION DATE:  9/24 REFERRING MD:  Heber Opelika, CHIEF COMPLAINT:  Dyspnea   Brief History   49 yo female admitted with COVID 19 pneumonia on 9/13, had been diagnosed with COVID on 9/9.  On 9/24 her condition worsened and PCCM was consulted, she was sent to the ICU and treated with BIPAP, then eventually intubated.  Started on CRRT through a tunneled HD cath. Extubated on 10/6 but re-intubated on 10/8.  Has been challenged by sedation needs since admission.   Past Medical History  Sleep apnea Morbid obesity Hypothyroidism Hypertension GERD  End-stage renal disease on HD MWF Diabetes Asthma  Significant Hospital Events   9/14 admit 9/25 on BiPAP with Precedex overnight 9/26 -unable to run CRRT with prone ventilation.  Resume supine ventilation but still unable to draw blood back. 9/27 transferred to 47m. IR replaced HD catheter 10/1 on CRRT and vasopressors 10/6 - extubated 10/7 - had some stridor and increased WOB so placed on BIPAP and dexamethasone given.  10/15 weaning sedation. midaz off 10/16 move to 79M 10/18 s/p 6 proximal XLT Shiley tracheostomy  10/19: Resumed Eliquis low dose. Per Dr. Thompson Caul note 10/7 will need to be on [facility] for 1 month then reasses 10/19: Episode of hypoglycemia 55. 10/21 hypoglycemic again. levemir and tubefeed coverage decreased. Low grade temp->sputum culture send had vomiting event on 10/19 w/ concern for aspiration. Fent was stopped 10/20. Still gets anxious at night. Required increased precedex. Added clonazepam. 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 11/2 Surgical debridement   Consults:   Nephrology  Procedures:  9/23 tunneled HD cath placement >  10/8 ETT > 10/18 10/18 6 proximal XLT Shiley tracheostomy  >  Significant Diagnostic Tests:  9/21 Vas ultrasound> thrombosed LUE fistula 9/21 VQ scan > negative for PE 9/29 echo> LVEF 60-65%, flat ventrcile consistent with RV pressure overload, RV severely enlarged, moderate reduction in RV function  Micro Data:  9/13 blood > neg   9/14 sars cov 2 > pos 9/26 blood > neg 10/8 resp > MSSA 10/8 blood > negative  10/21 sputum > Normal flora  10/25 Blood > ngtd x 3 days 10/25 Sputum  > few diphtheroids  10/25 Blood >>negative 10/29 Sputum >> Staph aureus 11/2 Blood>> negative 11/2 sputum >> negative 11/2 Wound >> E. Coli, enterococcus faecalis  Antimicrobials:  9/14 remdesivir > 9/19 9/15 tocilizumab > x1 9/20 cefepime > 9/29 9/20 azithro >  9/25 10/8 vanc > 10/9 10/8 zosyn > x1 10/10 ceftriaxone > 10/16 10/31 vancomycin >> 1/7 11/2 zosyn>> Interim history/subjective:  No acute overnight events. grimacing and and teaful  Objective   Blood pressure 117/60, pulse (!) 112, temperature 98.3 F (36.8 C), temperature source Oral, resp. rate 18, height 5\' 2"  (1.575 m), weight 109.7 kg, SpO2 100 %.    Vent Mode: CPAP;PSV FiO2 (%):  [30 %-40 %] 40 % PEEP:  [5 cmH20] 5 cmH20 Pressure Support:  [5 cmH20] 5 cmH20   Intake/Output Summary (Last 24 hours) at 04/09/2020 0942 Last data filed at 04/09/2020 0900 Gross per 24 hour  Intake 1504.55 ml  Output 300 ml  Net 1204.55 ml   Filed Weights   04/07/20 0449  04/08/20 0500 04/09/20 0500  Weight: 109.6 kg 110.6 kg 109.7 kg   Examination: General: Chronically ill appearing elderly female lying in bed on mechanical ventilation, in NAD HEENT: 6cuffed proximal XLT trach,, MM pink/moist, PERRL,  Neuro: Opens eyes spontaneously, unable to follow commands  CV: s1s2 regular rate and rhythm, no murmur, rubs, or gallops,  PULM:  Rhonchi bilaterally, no increased work of  breathing, tolerating vent well  GI: soft, bowel sounds active in all 4 quadrants, non-tender, non-distended, tolerating TF Extremities: warm/dry, no edema  Skin: sacral wound with packing  Resolved Hospital Problem list   Thrombocytopenia  MSSA PNA  Nausea  Agitated delirium  Assessment & Plan:   Acute hypoxic respiratory failure from COVID 19 pneumonia with ARDS complicated by MSSA PNA. Failure to wean s/p tracheostomy. 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 Wound cultures positive for E. Coli and E. Faecalis. P: Further debridement planned 11/5 by surgery but seems to have had discordance between family about goals of care Surgery to evaluate for need for further debridement Continue zosyn Pressors weaned off 11/7  ESRD P: Receiving iHD today Nephrology following, appreciate assistance  Follow renal function  Trend Bmet  Enteral hydration   Acute metabolic encephalopathy in setting of sepsis P: Well controlled on current regimen RASS goal 0 Continue Klonipin, Oxycodone, and Seroquel   Anemia  -r/t to blood loss from sacrum and anemia of critical illness. S/p 7 units PRBC P: Trend CBC  Transfuse per protocol  Wound care   Hypothyroidism P: Continue synthroid   DM type 2 with labile blood sugars. P: Continue SSI, Levimir, and tube feeds CBG checks q4hrs  Dysphagia. - Family would no like to purse PEG placement, IR notified and will continue to follow patients clinical course for availability to safely place PEG tube   Pressure ulcer -Unstageable sacral ulcer stage II right neck presure injury  P: Wound care  Pressure alleviating devices  Frequent turns   [place] Family discussion 11/2. Dr. Jonnie Finner  and Dr. Bobbye Morton present during this conversation family elects for DNR and transition to comfort care.  However on further discussion 11/3 with both daughters identifying family has decided to pursue current aggressive measures and would like additional operative intervention if indicated.  Will discuss with general surgery regarding surgical plans.  Patient continues to remain DO NOT RESUSCITATE.  Best practice:  Diet: tube feeding DVT prophylaxis: Heparin  GI prophylaxis: Protonix Mobility: Bed rest Code Status: DNR Family discussion: as able Disposition: ICU  Labs:   CMP Latest Ref Rng & Units 04/08/2020 04/07/2020 04/06/2020  Glucose 70 - 99 mg/dL 291(H) 285(H) 199(H)  BUN 6 - 20 mg/dL 56(H) 35(H) 58(H)  Creatinine 0.44 - 1.00 mg/dL 4.33(H) 3.00(H) 4.33(H)  Sodium 135 - 145 mmol/L 139 136 134(L)  Potassium 3.5 - 5.1 mmol/L 4.4 4.1 5.4(H)  Chloride 98 - 111 mmol/L 101 99 98  CO2 22 - 32 mmol/L 23 24 21(L)  Calcium 8.9 - 10.3 mg/dL 8.8(L) 8.9 9.0  Total Protein 6.5 - 8.1 g/dL - - -  Total Bilirubin 0.3 - 1.2 mg/dL - - -  Alkaline Phos 38 - 126 U/L - - -  AST 15 - 41 U/L - - -  ALT 0 - 44 U/L - - -  CBC Latest Ref Rng & Units 04/08/2020 04/07/2020 04/07/2020  WBC 4.0 - 10.5 K/uL - 18.9(H) 18.0(H)  Hemoglobin 12.0 - 15.0 g/dL 9.0(L) 6.7(LL) 7.2(L)  Hematocrit 36 - 46 % 26.8(L) 20.4(L) 22.1(L)  Platelets 150 - 400 K/uL - 250 231    ABG    Component Value Date/Time   PHART 7.250 (L) 04/03/2020 1030   PCO2ART 64.8 (H) 04/03/2020 1030   PO2ART 156 (H) 04/03/2020 1030   HCO3 28.4 (H) 04/03/2020 1030   TCO2 30 04/03/2020 1030   ACIDBASEDEF 2.0 04/03/2020 0907   O2SAT 99.0 04/03/2020 1030    CBG (last 3)  Recent Labs    04/08/20 2353 04/09/20 0306 04/09/20 0733  GLUCAP 217* 197* 229*    Critical care time:    CRITICAL CARE Performed by: Iona Beard   Total critical care time: 30 minutes  Critical care time was exclusive of separately billable procedures and  treating other patients.  Critical care was necessary to treat or prevent imminent or life-threatening deterioration.  Critical care was time spent personally by me on the following activities: development of treatment plan with patient and/or surrogate as well as nursing, discussions with consultants, evaluation of patient's response to treatment, examination of patient, obtaining history from patient or surrogate, ordering and performing treatments and interventions, ordering and review of laboratory studies, ordering and review of radiographic studies, pulse oximetry and re-evaluation of patient's condition.

## 2020-04-09 NOTE — Progress Notes (Signed)
[facility] Progress Note   OP HD:TTS 4h 43mn 450/800 2/2.25 bath 111.5kg Hep 2000 L AVF - darbe 50 ug q week, last 9/7 - calc 1.0 tiw - 9/9 Hb 10.0, tsat 22%  49yo female w/ ESRD on TTS HD admitted for COVID PNA on 02/13/20. She was intubated. SP CRRT around 9/26- 10/7. Now is on intermittent HD. She is sp trach. She had MSSA pna. DM2.  Assessment/ Plan:   1. COVID pna /sp trach - on vent, per ccm - currently on TC this AM 2. Septic shock - off pressors now 3. Sacral decub w/ dermal necrosis - sp I&D 11/2 and again 11/5 by gen surg. Currently on pip/tazo  4. S/P MSSA HCAP/ VAP 5. ESRD -usual HD TTS.sp CRRT 9/26- 10/7. - next HD today, will keep on MWF sched for now  4. BP/ volume - on midodrine 10 tid. Some dependent hip edema bilat. Wt's up 110kg. UF w/ HD today as tolerated - 2-2.5L goal.  5. HD access: LUA AVF clotted while here. TDC x2 here by IR.LIJ TC 6. 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 Phos 3.9 10/30 8. DM2 - per pmd  LJannifer HickMD [facility] Pager [phone]     Subjective:   Seen in ICU, awake  Daughters appear to be on conference video call on screen in room.   Objective:   BP 117/60   Pulse (!) 117   Temp 98.3 F (36.8 C) (Oral)   Resp (!) 24   Ht '5\' 2"'  (1.575 m)   Wt 109.7 kg   SpO2 100%   BMI 44.23 kg/m   Intake/Output Summary (Last 24 hours) at 04/09/2020 0742 Last data filed at 04/09/2020 08937Gross per 24 hour  Intake 1460.65 ml  Output 300 ml  Net 1160.65 ml   Weight change: -0.9 kg  Physical Exam: More alert, NG in , +trach collar  CVS-tachy ABD- BS present soft non-distended  EXT- trace pedal edema, lt arm access thrombosed ACCESS: LIJ TC  Imaging: No results found.  Labs: BMET Recent Labs  Lab 04/03/20 0220 04/03/20 0342811/02/21 1023 04/03/20 1030 04/04/20 0423 04/05/20 0601  04/06/20 0508 04/07/20 0416 04/08/20 1015  NA 132*   < > 133* 133* 136 135 134* 136 139  K 4.4   < > 4.8 4.8 4.9 4.2 5.4* 4.1 4.4  CL 93*  --  97*  --  100 98 98 99 101  CO2 25  --  24  --  22 24 21* 24 23  GLUCOSE 163*  --  130*  --  167* 156* 199* 285* 291*  BUN 40*  --  47*  --  68* 48* 58* 35* 56*  CREATININE 2.58*  --  2.87*  --  3.90* 3.30* 4.33* 3.00* 4.33*  CALCIUM 9.2  --  8.7*  --  9.6 9.4 9.0 8.9 8.8*  PHOS 3.2  --   --   --  5.7*  --   --   --   --    < > = values in this interval not displayed.   CBC Recent Labs  Lab 04/05/20 0601 04/05/20 1726 04/06/20 0508 04/06/20 1646 04/06/20 2320 04/07/20 0416 04/07/20 1820 04/08/20 0545  WBC 22.6*  --  20.0*  --   --  18.0* 18.9*  --   HGB 6.7*   < >  6.5*   < > 8.3* 7.2* 6.7* 9.0*  HCT 20.3*   < > 20.3*   < > 25.4* 22.1* 20.4* 26.8*  MCV 86.4  --  88.6  --   --  87.4 86.4  --   PLT 218  --  208  --   --  231 250  --    < > = values in this interval not displayed.    Medications:    . sodium chloride   Intravenous Once  . sodium chloride   Intravenous Once  . B-complex with vitamin C  1 tablet Per Tube Daily  . chlorhexidine gluconate (MEDLINE KIT)  15 mL Mouth Rinse BID  . Chlorhexidine Gluconate Cloth  6 each Topical Q0600  . clonazepam  0.5 mg Per Tube TID  . collagenase   Topical Daily  . darbepoetin (ARANESP) injection - DIALYSIS  150 mcg Intravenous Q Tue-HD  . feeding supplement (PROSource TF)  45 mL Per Tube TID  . fiber  1 packet Per Tube BID  . hydrocerin   Topical Daily  . insulin aspart  0-20 Units Subcutaneous Q4H  . insulin aspart  5 Units Subcutaneous Q4H  . insulin detemir  20 Units Subcutaneous BID  . levothyroxine  125 mcg Per Tube Q0600  . mouth rinse  15 mL Mouth Rinse 10 times per day  . midodrine  10 mg Per Tube TID WC  . oxyCODONE  10 mg Per Tube Q6H  . pantoprazole sodium  40 mg Per Tube BID  . polyethylene glycol  17 g Per Tube Daily  . QUEtiapine  100 mg Per Tube BID  . sodium  chloride flush  10-40 mL Intracatheter Q12H

## 2020-04-09 NOTE — Progress Notes (Signed)
Inpatient Diabetes Program Recommendations  AACE/ADA: New Consensus Statement on Inpatient Glycemic Control (2015)  Target Ranges:  Prepandial:   less than 140 mg/dL      Peak postprandial:   less than 180 mg/dL (1-2 hours)      Critically ill patients:  140 - 180 mg/dL   Lab Results  Component Value Date   GLUCAP 229 (H) 04/09/2020   HGBA1C 12.8 (H) 02/14/2020    Review of Glycemic Control  Results for JAYDAH, STAHLE (MRN [id number]) as of 04/09/2020 11:04  Ref. Range 04/08/2020 16:32 04/08/2020 20:12 04/08/2020 23:53 04/09/2020 03:06 04/09/2020 07:33  Glucose-Capillary Latest Ref Range: 70 - 99 mg/dL 229 (H) 235 (H) 217 (H) 197 (H) 229 (H)   Diabetes history: DM 2 Outpatient Diabetes medications:  Amaryl 4 mg q AM and Amaryl 2 mg q PM U500 insulin 3 units q AM and 6 units q PM Victoza 1.8 mg daily Current orders for Inpatient glycemic control:  Levemir 20 units bid, Novolog 5 units q 4 hours, Novolog 0-20 units q 4 hours  Inpatient Diabetes Program Recommendations:   Consider slight increase of Levemir to 24 units bid.  Also consider increasing Novolog tube feed coverage to 6 units q 4 hours.   Thanks,  Adah Perl, RN, BC-ADM Inpatient Diabetes Coordinator Pager [phone] (8a-5p)

## 2020-04-09 NOTE — Progress Notes (Signed)
[facility] Progress Note  6 Days Post-Op  Subjective: CC-  On trach collar. Appears very uncomfortable  Objective: Vital signs in last 24 hours: Temp:  [98.3 F (36.8 C)-100.1 F (37.8 C)] 98.3 F (36.8 C) (11/08 1205) Pulse Rate:  [101-128] 103 (11/08 1430) Resp:  [16-37] 27 (11/08 1430) BP: (86-117)/(48-60) 111/53 (11/08 1430) SpO2:  [90 %-100 %] 100 % (11/08 1430) Arterial Line BP: (92-164)/(46-88) 93/52 (11/08 1215) FiO2 (%):  [30 %-40 %] 40 % (11/08 1053) Weight:  [109.7 kg] 109.7 kg (11/08 1205) Last BM Date: 04/09/20  Intake/Output from previous day: 11/07 0701 - 11/08 0700 In: 1460.7 [I.V.:45.7; NG/GT:1265; IV Piggyback:150] Out: 300 [Stool:300] Intake/Output this shift: Total I/O In: 495.8 [I.V.:60.8; NG/GT:385; IV Piggyback:50] Out: -   PE: Gen:  Alert, NAD HEENT: trach in place Pulm:  Rate and effort normal Abd: Soft, NT Neuro: does not follow commands, tracks faces with her eyes at times but not consistently  Skin: warm and dry GU: wound is actually very clean, some friable areas but mostly granulation tissue, no purulent drainage     Lab Results:  Recent Labs    04/07/20 1820 04/07/20 1820 04/08/20 0545 04/09/20 1025  WBC 18.9*  --   --  16.9*  HGB 6.7*   < > 9.0* 8.3*  HCT 20.4*   < > 26.8* 25.8*  PLT 250  --   --  303   < > = values in this interval not displayed.   BMET Recent Labs    04/08/20 1015 04/09/20 0917  NA 139 140  K 4.4 4.7  CL 101 101  CO2 23 20*  GLUCOSE 291* 249*  BUN 56* 75*  CREATININE 4.33* 5.30*  CALCIUM 8.8* 9.0   PT/INR Recent Labs    04/09/20 1025  LABPROT 14.9  INR 1.2   CMP     Component Value Date/Time   NA 140 04/09/2020 0917   K 4.7 04/09/2020 0917   CL 101 04/09/2020 0917   CO2 20 (L) 04/09/2020 0917   GLUCOSE 249 (H) 04/09/2020 0917   BUN 75 (H) 04/09/2020 0917   CREATININE 5.30 (H) 04/09/2020 0917   CALCIUM 9.0 04/09/2020 0917   PROT 5.8 (L) 04/04/2020 0423   ALBUMIN  1.2 (L) 04/09/2020 0917   AST 62 (H) 04/04/2020 0423   ALT 20 04/04/2020 0423   ALKPHOS 202 (H) 04/04/2020 0423   BILITOT 4.0 (H) 04/04/2020 0423   GFRNONAA 9 (L) 04/09/2020 0917   GFRAA 33 (L) 03/05/2020 1703   Lipase  No results found for: LIPASE     Studies/Results: No results found.  Anti-infectives: Anti-infectives (From admission, onward)   Start     Dose/Rate Route Frequency Ordered Stop   04/06/20 1200  vancomycin (VANCOCIN) IVPB 1000 mg/200 mL premix        1,000 mg 200 mL/hr over 60 Minutes Intravenous Every Fri (Hemodialysis) 04/05/20 1529 04/06/20 1300   04/04/20 1200  vancomycin (VANCOCIN) IVPB 1000 mg/200 mL premix        1,000 mg 200 mL/hr over 60 Minutes Intravenous Every M-W-F (Hemodialysis) 04/04/20 1014 04/04/20 1423   04/03/20 0000  piperacillin-tazobactam (ZOSYN) IVPB 2.25 g        2.25 g 100 mL/hr over 30 Minutes Intravenous Every 8 hours 04/02/20 2253     04/02/20 2345  vancomycin (VANCOCIN) IVPB 1000 mg/200 mL premix        1,000 mg 200 mL/hr over 60 Minutes Intravenous  Once 04/02/20 2251 04/03/20  0236   04/02/20 2251  vancomycin variable dose per unstable renal function (pharmacist dosing)  Status:  Discontinued         Does not apply See admin instructions 04/02/20 2251 04/07/20 1037   04/02/20 1200  ceFAZolin (ANCEF) IVPB 1 g/50 mL premix  Status:  Discontinued        1 g 100 mL/hr over 30 Minutes Intravenous Every 24 hours 04/02/20 1030 04/02/20 2251   04/01/20 1300  vancomycin (VANCOCIN) 2,000 mg in sodium chloride 0.9 % 500 mL IVPB  Status:  Discontinued        2,000 mg 250 mL/hr over 120 Minutes Intravenous  Once 04/01/20 1203 04/01/20 1203   04/01/20 1300  vancomycin (VANCOREADY) IVPB 2000 mg/400 mL        2,000 mg 200 mL/hr over 120 Minutes Intravenous Every 12 hours 04/01/20 1205 04/01/20 1715   04/01/20 1300  vancomycin (VANCOCIN) 2,000 mg in sodium chloride 0.9 % 500 mL IVPB  Status:  Discontinued        2,000 mg 250 mL/hr over 120  Minutes Intravenous  Once 04/01/20 1207 04/01/20 1208   04/01/20 1245  vancomycin (VANCOCIN) 2,000 mg in sodium chloride 0.9 % 500 mL IVPB  Status:  Discontinued        2,000 mg 250 mL/hr over 120 Minutes Intravenous  Once 04/01/20 1154 04/01/20 1206   04/01/20 1154  vancomycin variable dose per unstable renal function (pharmacist dosing)  Status:  Discontinued         Does not apply See admin instructions 04/01/20 1154 04/02/20 1030   03/11/20 1200  cefTRIAXone (ROCEPHIN) 2 g in sodium chloride 0.9 % 100 mL IVPB        2 g 200 mL/hr over 30 Minutes Intravenous Every 24 hours 03/11/20 1107 03/17/20 1307   03/10/20 1200  piperacillin-tazobactam (ZOSYN) IVPB 3.375 g  Status:  Discontinued        3.375 g 12.5 mL/hr over 240 Minutes Intravenous Every 12 hours 03/10/20 1117 03/10/20 1245   03/10/20 1130  vancomycin (VANCOCIN) IVPB 1000 mg/200 mL premix        1,000 mg 200 mL/hr over 60 Minutes Intravenous  Once 03/10/20 1117 03/10/20 1258   03/10/20 1000  vancomycin variable dose per unstable renal function (pharmacist dosing)  Status:  Discontinued         Does not apply See admin instructions 03/09/20 1412 03/14/20 0810   03/09/20 2300  piperacillin-tazobactam (ZOSYN) IVPB 2.25 g  Status:  Discontinued        2.25 g 100 mL/hr over 30 Minutes Intravenous Every 8 hours 03/09/20 1412 03/10/20 1117   03/09/20 1415  piperacillin-tazobactam (ZOSYN) IVPB 3.375 g        3.375 g 100 mL/hr over 30 Minutes Intravenous  Once 03/09/20 1412 03/09/20 1537   03/09/20 1415  vancomycin (VANCOCIN) 2,250 mg in sodium chloride 0.9 % 500 mL IVPB        2,250 mg 250 mL/hr over 120 Minutes Intravenous  Once 03/09/20 1412 03/09/20 1639   02/28/20 2200  ceFEPIme (MAXIPIME) 1 g in sodium chloride 0.9 % 100 mL IVPB        1 g 200 mL/hr over 30 Minutes Intravenous Every 12 hours 02/28/20 1305 02/29/20 2220   02/28/20 0907  ceFEPIme (MAXIPIME) 1 g in sodium chloride 0.9 % 100 mL IVPB  Status:  Discontinued        1  g 200 mL/hr over 30 Minutes Intravenous Every 24 hours 02/27/20  5681 02/28/20 1305   02/26/20 2200  ceFEPIme (MAXIPIME) 2 g in sodium chloride 0.9 % 100 mL IVPB  Status:  Discontinued        2 g 200 mL/hr over 30 Minutes Intravenous Every 12 hours 02/26/20 1519 02/27/20 0939   02/25/20 1200  ceFEPIme (MAXIPIME) 2 g in sodium chloride 0.9 % 100 mL IVPB  Status:  Discontinued        2 g 200 mL/hr over 30 Minutes Intravenous Every T-Th-Sa (Hemodialysis) 02/25/20 0955 02/26/20 1519   02/24/20 1904  azithromycin (ZITHROMAX) 500 mg in sodium chloride 0.9 % 250 mL IVPB  Status:  Discontinued        500 mg 250 mL/hr over 60 Minutes Intravenous Every 24 hours 02/24/20 1904 02/26/20 0815   02/24/20 1900  azithromycin (ZITHROMAX) 250 mg in dextrose 5 % 125 mL IVPB  Status:  Discontinued        250 mg 125 mL/hr over 60 Minutes Intravenous Every 24 hours 02/24/20 1845 02/24/20 1904   02/24/20 1800  ceFEPIme (MAXIPIME) 2 g in sodium chloride 0.9 % 100 mL IVPB  Status:  Discontinued        2 g 200 mL/hr over 30 Minutes Intravenous Every Fri (Hemodialysis) 02/24/20 1326 02/25/20 0942   02/22/20 1745  ceFAZolin (ANCEF) IVPB 2g/100 mL premix        2 g 200 mL/hr over 30 Minutes Intravenous  Once 02/22/20 1744 02/22/20 1931   02/22/20 0600  ceFAZolin (ANCEF) IVPB 2g/100 mL premix        2 g 200 mL/hr over 30 Minutes Intravenous To Radiology 02/21/20 1658 02/22/20 1900   02/22/20 0000  ceFAZolin (ANCEF) IVPB 1 g/50 mL premix  Status:  Discontinued        1 g 100 mL/hr over 30 Minutes Intravenous To Radiology 02/21/20 1645 02/21/20 1658   02/21/20 1800  azithromycin (ZITHROMAX) tablet 250 mg  Status:  Discontinued       "Followed by" Linked Group Details   250 mg Oral Daily-1800 02/20/20 1658 02/24/20 1852   02/21/20 1800  ceFEPIme (MAXIPIME) 2 g in sodium chloride 0.9 % 100 mL IVPB  Status:  Discontinued        2 g 200 mL/hr over 30 Minutes Intravenous Every T-Th-Sa (1800) 02/20/20 1737 02/24/20 1326    02/20/20 1800  azithromycin (ZITHROMAX) tablet 500 mg       "Followed by" Linked Group Details   500 mg Oral Daily 02/20/20 1658 02/20/20 1731   02/20/20 1745  ceFEPIme (MAXIPIME) 1 g in sodium chloride 0.9 % 100 mL IVPB        1 g 200 mL/hr over 30 Minutes Intravenous STAT 02/20/20 1737 02/21/20 0747   02/19/20 1145  remdesivir 100 mg in sodium chloride 0.9 % 100 mL IVPB        100 mg 200 mL/hr over 30 Minutes Intravenous Daily 02/19/20 1131 02/19/20 1443   02/15/20 1000  remdesivir 100 mg in sodium chloride 0.9 % 100 mL IVPB  Status:  Discontinued       "Followed by" Linked Group Details   100 mg 200 mL/hr over 30 Minutes Intravenous Daily 02/14/20 0944 02/14/20 0951   02/15/20 1000  remdesivir 100 mg in sodium chloride 0.9 % 100 mL IVPB        100 mg 200 mL/hr over 30 Minutes Intravenous Daily 02/14/20 0952 02/17/20 1102   02/14/20 1030  remdesivir 100 mg in sodium chloride 0.9 % 100 mL IVPB  100 mg 200 mL/hr over 30 Minutes Intravenous Every 30 min 02/14/20 2956 02/14/20 1129   02/14/20 0945  remdesivir 200 mg in sodium chloride 0.9% 250 mL IVPB  Status:  Discontinued       "Followed by" Linked Group Details   200 mg 580 mL/hr over 30 Minutes Intravenous Once 02/14/20 0944 02/14/20 0951       Assessment/Plan OSA Morbid obesity BMI 44 Hypothyroidism HTN GERD  ESRD on HD DM Asthma  Chronic respiratory failure 2/2 Covid-19 (diagnosed 02/09/20) S/p tracheostomy Code status DNR  Necrotizing soft tissue infection S/p incision and debridement of sacrum, left gluteus 11/2 Dr. Bobbye Morton - POD#6 - culture: E COLI and ENTEROCOCCUS FAECALIS sensitive to zosyn - The wound is very clean and does not need further surgical debridement. Recommend continuing wound care as per [facility] recommendations with twice daily wet to dry dressing changes. May be able to apply wound vac in the future. She does not need any more antibiotics. General surgery will sign off, please call with concerns.    ID - currently zosyn 11/2>>day#7 FEN - NPO, TF VTE - sq heparin   LOS: 92 days    Wellington Hampshire, [facility] Surgery 04/09/2020, 2:50 PM Please see Amion for pager number during day hours 7:00am-4:30pm

## 2020-04-09 NOTE — Progress Notes (Signed)
Assisted tele visit to patient with daughter.  Thomas, Marilu Rylander Renee, RN   

## 2020-04-09 NOTE — Progress Notes (Signed)
Assisted tele visit to patient with family member.  Thomas, Jarl Sellitto Renee, RN   

## 2020-04-09 NOTE — Consult Note (Signed)
[facility] Nurse Consult Note: Reason for Consult: Events reviewed since last visit with [place] nurse D. Engels on 04/02/20 including surgical debridement on 11/2 by Dr. Bobbye Morton.  No return to OR has occurred. Discussed patient with [facility] PAs K. Osborne and B. Meuth today.  Patient not seen today as she is getting HD and cannot be turned for complete assessment until later in the day. Wound type: Pressure, surgical Pressure Injury POA: No  Dressing procedure/placement/frequency: It is decided that collagenase [facility]) may be discontinued in favor of twice daily NS dressings to the wound. I have entered those order modifications for Nursing guidance.  [place] Nursing to defer to surgery for wound care POC at this time.   [place] nursing team will not follow, but will remain available to this patient, the nursing and medical teams.  Please re-consult if needed. Thanks, Maudie Flakes, MSN, RN, [place], Arther Abbott  Pager# [phone]

## 2020-04-09 NOTE — Progress Notes (Signed)
Assisted tele visit to patient with family member.  Correna Meacham D Jancarlo Biermann, RN   

## 2020-04-10 DIAGNOSIS — R0902 Hypoxemia: Secondary | ICD-10-CM

## 2020-04-10 LAB — CBC
HCT: 27.1 % — ABNORMAL LOW (ref 36.0–46.0)
Hemoglobin: 8.6 g/dL — ABNORMAL LOW (ref 12.0–15.0)
MCH: 28.4 pg (ref 26.0–34.0)
MCHC: 31.7 g/dL (ref 30.0–36.0)
MCV: 89.4 fL (ref 80.0–100.0)
Platelets: 353 10*3/uL (ref 150–400)
RBC: 3.03 MIL/uL — ABNORMAL LOW (ref 3.87–5.11)
RDW: 16.4 % — ABNORMAL HIGH (ref 11.5–15.5)
WBC: 20.9 10*3/uL — ABNORMAL HIGH (ref 4.0–10.5)
nRBC: 0 % (ref 0.0–0.2)

## 2020-04-10 LAB — GLUCOSE, CAPILLARY
Glucose-Capillary: 209 mg/dL — ABNORMAL HIGH (ref 70–99)
Glucose-Capillary: 182 mg/dL — ABNORMAL HIGH (ref 70–99)
Glucose-Capillary: 181 mg/dL — ABNORMAL HIGH (ref 70–99)
Glucose-Capillary: 161 mg/dL — ABNORMAL HIGH (ref 70–99)
Glucose-Capillary: 194 mg/dL — ABNORMAL HIGH (ref 70–99)
Glucose-Capillary: 220 mg/dL — ABNORMAL HIGH (ref 70–99)
Glucose-Capillary: 221 mg/dL — ABNORMAL HIGH (ref 70–99)

## 2020-04-10 MED ORDER — PROSOURCE TF PO LIQD
45.0000 mL | Freq: Four times a day (QID) | ORAL | Status: DC
  Administered 2020-04-10 – 2020-04-13 (×11): 45 mL
  Filled 2020-04-10 (×12): qty 45

## 2020-04-10 MED ORDER — INSULIN GLARGINE 100 UNIT/ML ~~LOC~~ SOLN
10.0000 [IU] | Freq: Once | SUBCUTANEOUS | Status: AC
  Administered 2020-04-10: 10 [IU] via SUBCUTANEOUS
  Filled 2020-04-10: qty 0.1

## 2020-04-10 MED ORDER — INSULIN DETEMIR 100 UNIT/ML ~~LOC~~ SOLN
30.0000 [IU] | Freq: Two times a day (BID) | SUBCUTANEOUS | Status: DC
  Administered 2020-04-10 – 2020-04-11 (×2): 30 [IU] via SUBCUTANEOUS
  Filled 2020-04-10 (×3): qty 0.3

## 2020-04-10 MED ORDER — DARBEPOETIN ALFA 150 MCG/0.3ML IJ SOSY
150.0000 ug | PREFILLED_SYRINGE | INTRAMUSCULAR | Status: DC
  Filled 2020-04-10 (×3): qty 0.3

## 2020-04-10 NOTE — Progress Notes (Signed)
   MD calls today re G tube in IR for this pt. IR aware of pt and pt was seen and prepared for same -- see note 11/2  We were on HOLD per MD secondary change in pts condition and family meetings regarding  placement  MD now has spoken to family and confirms all want to move ahead. Pt has new spike in wbc  Will await trend down of wbc She is Afeb  I spoke to MD-- we will check trends-- and hope to move ahead later in week She is agreeable.

## 2020-04-10 NOTE — Progress Notes (Signed)
Patient's daughter Wanda Ramirez at bedside today. States she was here to provide paperwork stating she is patient's POA. Expressed that family would continue to pursue surgical interventions including a g-tube should it be medically necessary. Daughter states she would like to avoid procedures, but would like to have everything necessary to have her mother get better.

## 2020-04-10 NOTE — Progress Notes (Signed)
I have called report to 2w12 Claiborne Billings RN and I have also called her daughter [name] to update her of the transfer

## 2020-04-10 NOTE — Progress Notes (Signed)
Assisted tele visit to patient with daughter and family member.  Maryelizabeth Rowan, RN

## 2020-04-10 NOTE — Care Plan (Signed)
Received message from [facility] team regarding patient transferring out of ICU. IMTS service to take over care on 04/11/2020 at 7am.

## 2020-04-10 NOTE — Progress Notes (Deleted)
NAME:  Wanda Ramirez, MRN:  [id number], DOB:  [birth year], LOS: 65 ADMISSION DATE:  02/13/2020, CONSULTATION DATE:  9/24 REFERRING MD:  Heber [place], CHIEF COMPLAINT:  Dyspnea   Brief History   49 yo female admitted with COVID 19 pneumonia on 9/13, had been diagnosed with COVID on 9/9.  On 9/24 her condition worsened and PCCM was consulted, she was sent to the ICU and treated with BIPAP, then eventually intubated.  Started on CRRT through a tunneled HD cath. Extubated on 10/6 but re-intubated on 10/8. Now sp trach tolerating trach collar.   Past Medical History  Sleep apnea Morbid obesity Hypothyroidism Hypertension GERD  End-stage renal disease on HD MWF Diabetes Asthma  Significant Hospital Events   9/14 admit 9/25 on BiPAP with Precedex overnight 9/26 -unable to run CRRT with prone ventilation.  Resume supine ventilation but still unable to draw blood back. 9/27 transferred to 18m. IR replaced HD catheter 10/1 on CRRT and vasopressors 10/6 - extubated 10/7 - had some stridor and increased WOB so placed on BIPAP and dexamethasone given.  10/15 weaning sedation. midaz off 10/16 move to 18M 10/18 s/p 6 proximal XLT Shiley tracheostomy  10/19: Resumed Eliquis low dose. Per Dr. Thompson Caul note 10/7 will need to be on [facility] for 1 month then reasses 10/19: Episode of hypoglycemia 55. 10/21 hypoglycemic again. levemir and tubefeed coverage decreased. Low grade temp->sputum culture send had vomiting event on 10/19 w/ concern for aspiration. Fent was stopped 10/20. Still gets anxious at night. Required increased precedex. Added clonazepam. 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 11/2 Surgical debridement  11/5 off vanc 11/7 off pressors 11/8 On trach  collar, off zosyn surgery evaluated no further need for debridement  Consults:  Nephrology  Procedures:  9/23 tunneled HD cath placement >  10/8 ETT > 10/18 10/18 6 proximal XLT Shiley tracheostomy  >  Significant Diagnostic Tests:  9/21 Vas ultrasound> thrombosed LUE fistula 9/21 VQ scan > negative for PE 9/29 echo> LVEF 60-65%, flat ventrcile consistent with RV pressure overload, RV severely enlarged, moderate reduction in RV function  Micro Data:  9/13 blood > neg   9/14 sars cov 2 > pos 9/26 blood > neg 10/8 resp > MSSA 10/8 blood > negative  10/21 sputum > Normal flora  10/25 Blood > ngtd x 3 days 10/25 Sputum  > few diphtheroids  10/25 Blood >>negative 10/29 Sputum >> Staph aureus 11/2 Blood>> negative 11/2 sputum >> negative 11/2 Wound >> E. Coli, enterococcus faecalis  Antimicrobials:  9/14 remdesivir > 9/19 9/15 tocilizumab > x1 9/20 cefepime > 9/29 9/20 azithro >  9/25 10/8 vanc > 10/9 10/8 zosyn > x1 10/10 ceftriaxone > 10/16 10/31 vancomycin >> 1/7 11/2 zosyn>>11/8 Interim history/subjective:  No acute overnight events. Awake this morning. Nodding and shaking her head to questions.  Objective   Blood pressure 133/70, pulse (!) 111, temperature 98.1 F (36.7 C), temperature source Axillary, resp. rate 17, height 5\' 2"  (1.575 m), weight 107.2 kg, SpO2 100 %.    FiO2 (%):  [28 %-40 %] 28 %   Intake/Output Summary (Last 24 hours) at 04/10/2020 1339 Last data filed at 04/10/2020 1200 Gross per 24 hour  Intake 1360.67 ml  Output 2700 ml  Net -1339.33 ml  Filed Weights   04/09/20 0500 04/09/20 1205 04/09/20 1520  Weight: 109.7 kg 109.7 kg 107.2 kg   Examination: General: Chronically ill appearing elderly female lying in bed on mechanical ventilation, in NAD HEENT: 6 cuffed proximal XLT trach, MM pink/moist, PERRL,  Neuro: Patient more awake today nodding and shaking head to questions, intermittently follows commands  CV: s1s2 regular rate and rhythm,  no murmur, rubs, or gallops,  PULM:  Rhonchi bilaterally, no increased work of breathing, tolerating vent well  GI: soft, bowel sounds active in all 4 quadrants, non-tender, non-distended, tolerating TF Extremities: warm/dry, no edema  Skin: sacral wound with packing  Resolved Hospital Problem list   Thrombocytopenia  MSSA PNA  Nausea  Agitated delirium  Assessment & Plan:   Acute hypoxic respiratory failure from COVID 19 pneumonia with ARDS complicated by MSSA PNA. Failure to wean s/p tracheostomy. 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 Wound cultures positive for E. Coli and E. Faecalis. -Surgery evaluated recommending no further debridement  P: Pressors weaned off 11/7 Discontinued on zosyn 11/8 Fever overnight with Tmax of 100.4 with increase WBC to 20.9, no increase O2 requirements will recheck blood cultures.  ESRD P: Receiving iHD MWF Nephrology following, appreciate assistance  Follow renal function  Trend Bmet  Enteral hydration   Acute metabolic encephalopathy in setting of sepsis P: Well controlled on current regimen RASS goal 0 Continue Klonipin, Oxycodone, and Seroquel   Anemia  -r/t to blood loss from sacrum and anemia of critical illness. S/p 7 units PRBC P: Trend CBC  Transfuse per protocol  Wound care   Hypothyroidism P: Continue synthroid   DM type 2 with labile blood sugars. P: Continue SSI, Levimir, and tube feeds CBG checks q4hrs  Dysphagia. - Family currently still wishing to continue with g tube placement when able - Discussed with IR currently patient with elevated white count. IR will continue to follow patients clinical  course for availability to safely place PEG tube   Pressure ulcer -Unstageable sacral ulcer stage II right neck presure injury  P: Wound care  Pressure alleviating devices  Frequent turns   [place] Family discussion 11/2. Dr. Jonnie Finner and Dr. Bobbye Morton present during this conversation family elects for DNR and transition to comfort care.  However on further discussion 11/3 with both daughters identifying family has decided to pursue current aggressive measures and would like additional operative intervention if indicated. 11/9 Disscussed with daughter Barnie Alderman, who is POA and reiterates would like to continue with surgical interventions if indicated. Patient continues to remain DO NOT RESUSCITATE.  Best practice:  Diet: tube feeding DVT prophylaxis: Heparin [place] GI prophylaxis: Protonix Mobility: Bed rest Code Status: DNR Family discussion: as able Disposition: ICU  Labs:   CMP Latest Ref Rng & Units 04/10/2020 04/09/2020 04/08/2020  Glucose 70 - 99 mg/dL 257(H) 249(H) 291(H)  BUN 6 - 20 mg/dL 42(H) 75(H) 56(H)  Creatinine 0.44 - 1.00 mg/dL 3.45(H) 5.30(H) 4.33(H)  Sodium 135 - 145 mmol/L 141 140 139  Potassium 3.5 - 5.1 mmol/L 4.2 4.7 4.4  Chloride 98 - 111 mmol/L 102 101 101  CO2 22 - 32 mmol/L 22 20(L) 23  Calcium 8.9 - 10.3 mg/dL 9.2 9.0 8.8(L)  Total Protein 6.5 -  8.1 g/dL - - -  Total Bilirubin 0.3 - 1.2 mg/dL - - -  Alkaline Phos 38 - 126 U/L - - -  AST 15 - 41 U/L - - -  ALT 0 - 44 U/L - - -    CBC Latest Ref Rng & Units 04/10/2020 04/09/2020 04/08/2020  WBC 4.0 - 10.5 K/uL 20.9(H) 16.9(H) -  Hemoglobin 12.0 - 15.0 g/dL 8.6(L) 8.3(L) 9.0(L)  Hematocrit 36 - 46 % 27.1(L) 25.8(L) 26.8(L)  Platelets 150 - 400 K/uL 353 303 -    ABG    Component Value Date/Time   PHART 7.250 (L) 04/03/2020 1030   PCO2ART 64.8 (H) 04/03/2020 1030   PO2ART 156 (H) 04/03/2020 1030   HCO3 28.4 (H) 04/03/2020 1030   TCO2 30 04/03/2020 1030   ACIDBASEDEF 2.0 04/03/2020 0907   O2SAT 99.0 04/03/2020  1030    CBG (last 3)  Recent Labs    04/10/20 0336 04/10/20 0804 04/10/20 [place] PGY1 Internal medicine 04/10/20 7:04 AM  Pager [phone]

## 2020-04-10 NOTE — Progress Notes (Signed)
NAME:  Wanda Ramirez, MRN:  [id number], DOB:  06-11-1970, LOS: 61 ADMISSION DATE:  02/13/2020, CONSULTATION DATE:  9/24 REFERRING MD:  Heber Chinese Camp, CHIEF COMPLAINT:  Dyspnea   Brief History   49 yo female admitted with COVID 19 pneumonia on 9/13, had been diagnosed with COVID on 9/9.  On 9/24 her condition worsened and PCCM was consulted, she was sent to the ICU and treated with BIPAP, then eventually intubated.  Started on CRRT through a tunneled HD cath. Extubated on 10/6 but re-intubated on 10/8. Now sp trach tolerating trach collar.   Past Medical History  Sleep apnea Morbid obesity Hypothyroidism Hypertension GERD  End-stage renal disease on HD MWF Diabetes Asthma  Significant Hospital Events   9/14 admit 9/25 on BiPAP with Precedex overnight 9/26 -unable to run CRRT with prone ventilation.  Resume supine ventilation but still unable to draw blood back. 9/27 transferred to 92m. IR replaced HD catheter 10/1 on CRRT and vasopressors 10/6 - extubated 10/7 - had some stridor and increased WOB so placed on BIPAP and dexamethasone given.  10/15 weaning sedation. midaz off 10/16 move to 42M 10/18 s/p 6 proximal XLT Shiley tracheostomy  10/19: Resumed Eliquis low dose. Per Dr. Thompson Caul note 10/7 will need to be on [facility] for 1 month then reasses 10/19: Episode of hypoglycemia 55. 10/21 hypoglycemic again. levemir and tubefeed coverage decreased. Low grade temp->sputum culture send had vomiting event on 10/19 w/ concern for aspiration. Fent was stopped 10/20. Still gets anxious at night. Required increased precedex. Added clonazepam. 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 11/2 Surgical debridement  11/5 off vanc 11/7 off pressors 11/8 On trach  collar, off zosyn surgery evaluated no further need for debridement  Consults:  Nephrology  Procedures:  9/23 tunneled HD cath placement >  10/8 ETT > 10/18 10/18 6 proximal XLT Shiley tracheostomy  >  Significant Diagnostic Tests:  9/21 Vas ultrasound> thrombosed LUE fistula 9/21 VQ scan > negative for PE 9/29 echo> LVEF 60-65%, flat ventrcile consistent with RV pressure overload, RV severely enlarged, moderate reduction in RV function  Micro Data:  9/13 blood > neg   9/14 sars cov 2 > pos 9/26 blood > neg 10/8 resp > MSSA 10/8 blood > negative  10/21 sputum > Normal flora  10/25 Blood > ngtd x 3 days 10/25 Sputum  > few diphtheroids  10/25 Blood >>negative 10/29 Sputum >> Staph aureus 11/2 Blood>> negative 11/2 sputum >> negative 11/2 Wound >> E. Coli, enterococcus faecalis  Antimicrobials:  9/14 remdesivir > 9/19 9/15 tocilizumab > x1 9/20 cefepime > 9/29 9/20 azithro >  9/25 10/8 vanc > 10/9 10/8 zosyn > x1 10/10 ceftriaxone > 10/16 10/31 vancomycin >> 1/7 11/2 zosyn>>11/8 Interim history/subjective:  No acute overnight events. Awake this morning. Nodding and shaking her head to questions.  Objective   Blood pressure (!) 141/69, pulse (!) 115, temperature 99.2 F (37.3 C), temperature source Oral, resp. rate (!) 25, height 5\' 2"  (1.575 m), weight 107.2 kg, SpO2 100 %.    FiO2 (%):  [28 %-40 %] 28 %   Intake/Output Summary (Last 24 hours) at 04/10/2020 0704 Last data filed at 04/10/2020 0300 Gross per 24 hour  Intake 1500.05 ml  Output 2700 ml  Net -1199.95 ml  Filed Weights   04/09/20 0500 04/09/20 1205 04/09/20 1520  Weight: 109.7 kg 109.7 kg 107.2 kg   Examination: General: Chronically ill appearing elderly female lying in bed on mechanical ventilation, in NAD HEENT: 6 cuffed proximal XLT trach, MM pink/moist, PERRL,  Neuro: Patient more awake today nodding and shaking head to questions, intermittently follows commands  CV: s1s2 regular rate and  rhythm, no murmur, rubs, or gallops,  PULM:  Rhonchi bilaterally, no increased work of breathing, tolerating vent well  GI: soft, bowel sounds active in all 4 quadrants, non-tender, non-distended, tolerating TF Extremities: warm/dry, no edema  Skin: sacral wound with packing  Resolved Hospital Problem list   Thrombocytopenia  MSSA PNA  Nausea  Agitated delirium  Assessment & Plan:   Acute hypoxic respiratory failure from COVID 19 pneumonia with ARDS complicated by MSSA PNA. Failure to wean s/p tracheostomy. 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 Wound cultures positive for E. Coli and E. Faecalis. -Surgery evaluated recommending no further debridement  P: Pressors weaned off 11/7 Discontinued on zosyn 11/8 Fever overnight with Tmax of 100.4 no increase O2 requirements will recheck blood cultures.  ESRD P: Receiving iHD MWF Nephrology following, appreciate assistance  Follow renal function  Trend Bmet  Enteral hydration   Acute metabolic encephalopathy in setting of sepsis P: Well controlled on current regimen RASS goal 0 Continue Klonipin, Oxycodone, and Seroquel   Anemia  -r/t to blood loss from sacrum and anemia of critical illness. S/p 7 units PRBC P: Trend CBC  Transfuse per protocol  Wound care   Hypothyroidism P: Continue synthroid   DM type 2 with labile blood sugars. P: Continue SSI, Levimir, and tube feeds CBG checks q4hrs  Dysphagia. - Will discuss with family to come to decision about g tube placement - Discussed with IR and will continue to follow patients clinical course for availability to safely place PEG tube   Pressure  ulcer -Unstageable sacral ulcer stage II right neck presure injury  P: Wound care  Pressure alleviating devices  Frequent turns   Rule Family discussion 11/2. Dr. Jonnie Finner and Dr. Bobbye Morton present during this conversation family elects for DNR and transition to comfort care.  However on further discussion 11/3 with both daughters identifying family has decided to pursue current aggressive measures and would like additional operative intervention if indicated. Patient continues to remain DO NOT RESUSCITATE.  Best practice:  Diet: tube feeding DVT prophylaxis: Heparin Dodson GI prophylaxis: Protonix Mobility: Bed rest Code Status: DNR Family discussion: as able Disposition: ICU  Labs:   CMP Latest Ref Rng & Units 04/10/2020 04/09/2020 04/08/2020  Glucose 70 - 99 mg/dL 257(H) 249(H) 291(H)  BUN 6 - 20 mg/dL 42(H) 75(H) 56(H)  Creatinine 0.44 - 1.00 mg/dL 3.45(H) 5.30(H) 4.33(H)  Sodium 135 - 145 mmol/L 141 140 139  Potassium 3.5 - 5.1 mmol/L 4.2 4.7 4.4  Chloride 98 - 111 mmol/L 102 101 101  CO2 22 - 32 mmol/L 22 20(L) 23  Calcium 8.9 - 10.3 mg/dL 9.2 9.0 8.8(L)  Total Protein 6.5 - 8.1 g/dL - - -  Total Bilirubin 0.3 - 1.2 mg/dL - - -  Alkaline Phos 38 - 126 U/L - - -  AST 15 -  41 U/L - - -  ALT 0 - 44 U/L - - -    CBC Latest Ref Rng & Units 04/10/2020 04/09/2020 04/08/2020  WBC 4.0 - 10.5 K/uL 20.9(H) 16.9(H) -  Hemoglobin 12.0 - 15.0 g/dL 8.6(L) 8.3(L) 9.0(L)  Hematocrit 36 - 46 % 27.1(L) 25.8(L) 26.8(L)  Platelets 150 - 400 K/uL 353 303 -    ABG    Component Value Date/Time   PHART 7.250 (L) 04/03/2020 1030   PCO2ART 64.8 (H) 04/03/2020 1030   PO2ART 156 (H) 04/03/2020 1030   HCO3 28.4 (H) 04/03/2020 1030   TCO2 30 04/03/2020 1030   ACIDBASEDEF 2.0 04/03/2020 0907   O2SAT 99.0 04/03/2020 1030    CBG (last 3)  Recent Labs    04/09/20 1946 04/09/20 2343 04/10/20 [place]   Iona Beard PGY1 Internal medicine 04/10/20 7:04 AM  Pager [phone]

## 2020-04-10 NOTE — Progress Notes (Signed)
Nutrition Follow-up  DOCUMENTATION CODES:   Morbid obesity  INTERVENTION:   Continue tube feeds via Cortrak:  - Vital 1.5 @ 55 ml/hr (1320 ml/day) - Increase ProSource TF to 45 ml QID  Tube feeding regimen at goal provides 2140 kcal, 133 grams of protein, and 1008 ml of H2O (meets 100% of needs).  NUTRITION DIAGNOSIS:   Inadequate oral intake related to inability to eat as evidenced by NPO status.  Ongoing  GOAL:   Patient will meet greater than or equal to 90% of their needs  Met with TF  MONITOR:   Vent status, Labs, Weight trends, TF tolerance, Skin, I & O's  REASON FOR ASSESSMENT:   Consult Assessment of nutrition requirement/status  ASSESSMENT:   49-year-old female with a past medical history significant for end-stage renal disease on hemodialysis MWF, chronic hypoxic respiratory failure on 3 L nasal cannula at baseline, sleep apnea, diabetes, hypertension, hyperlipidemia, hypothyroidism, and asthma presents with complaints of worsening shortness of breath. Previously diagnosed with COVID PNA on 9/9.  09/28 - start CRRT 10/04-cortrak placed; tip gastric 10/07 - end CRRT 10/08-pt reintubated 10/14-s/p iHD 10/18 - trach 11/02 - s/p I&D of sacrum, left gluteus 11/08 - trach collar  Discussed patient in ICU rounds and with RN today. Patient has been on trach collar since yesterday.  Off Levophed this morning. Last HD was 10/8, next HD scheduled for 11/10. Pt with devastating necrotizing soft tissue infection. No further surgical intervention indicated at this time.  Patient is currently receiving Vital 1.5 via Cortrak at 55 ml/h (1320 ml/day) with Prosource TF 45 ml TID to provide 2100 kcals, 122 gm protein, 1008 ml free water daily.   Admit weight: 124.7 kg Current weight: 107.2 kg EDW (per nephrology): 111.5 kg  Medications reviewed and include: B-complex with vitamin C, aranesp, nutrisource fiber BID, SSI q 4 hours, Novolog 5 units q 4 hours,  levemir, protonix, miralax.  Labs reviewed. K & Phos WNL CBG's: [phone]  Rectal tube: 200 ml x 24 hours I/O's: +24.9 L since admit  Diet Order:   Diet Order            Diet NPO time specified  Diet effective midnight                 EDUCATION NEEDS:   Not appropriate for education at this time  Skin:  Skin Assessment: Skin Integrity Issues: Stage II: right neck Unstageable: mid sacrum Incisions: buttocks  Last BM:  11/9 rectal tube  Height:   Ht Readings from Last 1 Encounters:  04/05/20 5' 2" (1.575 m)    Weight:   Wt Readings from Last 1 Encounters:  04/09/20 107.2 kg    Ideal Body Weight:  50 kg  BMI:  Body mass index is 43.23 kg/m.  Estimated Nutritional Needs:   Kcal:  2100-2300  Protein:  120-140 grams  Fluid:  1 L + UOP    H, RD, LDN, CNSC Please refer to Amion for contact information.                                                        

## 2020-04-10 NOTE — Consult Note (Addendum)
[facility] Nurse Consult Note: Reason for Consult: Follow up after CCS visit and wound assessment.  General Surgery has signed off, the wound resulting from necrotizing soft tissue infection is relatively clean (see photo uploaded to the patient's EMR yesterday afternoon) and it is determined that no further surgery is indicated at this time. Dressings will be twice daily and PRN drainage strike through with normal saline moistened gauze, topped with dry gauze, ABDs and secured with tape. May require NPWT in the future. Bedside RN to measure wound once weekly on Wednesdays and document on the Nursing Flow Sheet. Wound type: infectious Pressure Injury POA: No  WOC Nursing will not follow, but will remain available to the patient, nursing, medical and surgical teams. Please reconsult if needed.  Thanks, Maudie Flakes, MSN, RN, [place], Arther Abbott  Pager# [phone]

## 2020-04-10 NOTE — Progress Notes (Signed)
[facility] Progress Note   OP HD:TTS 4h 103mn 450/800 2/2.25 bath 111.5kg Hep 2000 L AVF - darbe 50 ug q week, last 9/7 - calc 1.0 tiw - 9/9 Hb 10.0, tsat 22%  49yo female w/ ESRD on TTS HD admitted for COVID PNA on 02/13/20. She was intubated. SP CRRT around 9/26- 10/7. Now is on intermittent HD. She is sp trach. She had MSSA pna. DM2.  Assessment/ Plan:   1. COVID pna /sp trach - on vent, per ccm - currently on TC this AM 2. Septic shock - off pressors now 3. Sacral decub w/ dermal necrosis - sp I&D 11/2 and again 11/5 by gen surg. Currently on pip/tazo  4. S/P MSSA HCAP/ VAP 5. ESRD -usual HD TTS.sp CRRT 9/26- 10/7.Last HD 11/8 UF 2.5L, plan next 11/10. 4. BP/ volume - on midodrine 10 tid. Some dependent hip edema bilat. Wt's improving. UF w/ HD as tolerated 5. HD access: LUA AVF clotted while here. TDC x2 here by IR.LIJ TC 6. Anemia ckd/ critical illness - on darbe 150 weekly, tx prbc's prn - Hb in the mid 8s.  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 Phos 3.9 10/30 8. DM2 - per pmd  LJannifer HickMD [facility] Pager [phone]  Subjective:   Seen in ICU, awake, on TC   Objective:   BP 133/70   Pulse (!) 114   Temp 98.7 F (37.1 C) (Axillary)   Resp (!) 25   Ht _0  (1.575 m)   Wt 107.2 kg   SpO2 98%   BMI 43.23 kg/m   Intake/Output Summary (Last 24 hours) at 04/10/2020 1011 Last data filed at 04/10/2020 0900 Gross per 24 hour  Intake 1294.14 ml  Output 2700 ml  Net -1405.86 ml   Weight change: 0 kg  Physical Exam: Awake but not really participating NG in , +trach collar  Lungs - very coarse, loose rhonchi, coughing frequently today, no rales CVS-tachy ABD- BS present soft non-distended  EXT- trace pedal edema, lt arm access thrombosed ACCESS: LIJ TC  Imaging: No results found.  Labs: BMET Recent Labs  Lab 04/04/20 0423 04/05/20 0601 04/06/20 0508 04/07/20 0416 04/08/20 1015  04/09/20 0917 04/10/20 0518  NA 136 135 134* 136 139 140 141  K 4.9 4.2 5.4* 4.1 4.4 4.7 4.2  CL 100 98 98 99 101 101 102  CO2 22 24 21* 24 23 20* 22  GLUCOSE 167* 156* 199* 285* 291* 249* 257*  BUN 68* 48* 58* 35* 56* 75* 42*  CREATININE 3.90* 3.30* 4.33* 3.00* 4.33* 5.30* 3.45*  CALCIUM 9.6 9.4 9.0 8.9 8.8* 9.0 9.2  PHOS 5.7*  --   --   --   --  9.2*  --    CBC Recent Labs  Lab 04/07/20 0416 04/07/20 0416 04/07/20 1820 04/08/20 0545 04/09/20 1025 04/10/20 0518  WBC 18.0*  --  18.9*  --  16.9* 20.9*  HGB 7.2*   < > 6.7* 9.0* 8.3* 8.6*  HCT 22.1*   < > 20.4* 26.8* 25.8* 27.1*  MCV 87.4  --  86.4  --  88.4 89.4  PLT 231  --  250  --  303 353   < > = values in this interval not displayed.    Medications:    . sodium chloride   Intravenous Once  . sodium chloride   Intravenous Once  . B-complex with vitamin C  1 tablet  Per Tube Daily  . chlorhexidine gluconate (MEDLINE KIT)  15 mL Mouth Rinse BID  . Chlorhexidine Gluconate Cloth  6 each Topical Q0600  . clonazepam  0.5 mg Per Tube TID  . darbepoetin (ARANESP) injection - DIALYSIS  150 mcg Intravenous Q Tue-HD  . feeding supplement (PROSource TF)  45 mL Per Tube TID  . fiber  1 packet Per Tube BID  . heparin injection (subcutaneous)  5,000 Units Subcutaneous Q8H  . hydrocerin   Topical Daily  . insulin aspart  0-20 Units Subcutaneous Q4H  . insulin aspart  5 Units Subcutaneous Q4H  . insulin detemir  20 Units Subcutaneous BID  . levothyroxine  125 mcg Per Tube Q0600  . mouth rinse  15 mL Mouth Rinse 10 times per day  . midodrine  10 mg Per Tube TID WC  . oxyCODONE  10 mg Per Tube Q6H  . pantoprazole sodium  40 mg Per Tube BID  . polyethylene glycol  17 g Per Tube Daily  . QUEtiapine  100 mg Per Tube BID  . sodium chloride flush  10-40 mL Intracatheter Q12H

## 2020-04-11 LAB — COMPREHENSIVE METABOLIC PANEL
ALT: 25 U/L (ref 0–44)
AST: 26 U/L (ref 15–41)
Albumin: 1.4 g/dL — ABNORMAL LOW (ref 3.5–5.0)
Alkaline Phosphatase: 390 U/L — ABNORMAL HIGH (ref 38–126)
Anion gap: 15 (ref 5–15)
BUN: 69 mg/dL — ABNORMAL HIGH (ref 6–20)
CO2: 23 mmol/L (ref 22–32)
Calcium: 9.3 mg/dL (ref 8.9–10.3)
Chloride: 105 mmol/L (ref 98–111)
Creatinine, Ser: 5.12 mg/dL — ABNORMAL HIGH (ref 0.44–1.00)
GFR, Estimated: 10 mL/min — ABNORMAL LOW (ref >60)
Glucose, Bld: 244 mg/dL — ABNORMAL HIGH (ref 70–99)
Potassium: 5.4 mmol/L — ABNORMAL HIGH (ref 3.5–5.1)
Sodium: 143 mmol/L (ref 135–145)
Total Bilirubin: 1.1 mg/dL (ref 0.3–1.2)
Total Protein: 6.2 g/dL — ABNORMAL LOW (ref 6.5–8.1)

## 2020-04-11 LAB — BASIC METABOLIC PANEL
Anion gap: 17 — ABNORMAL HIGH (ref 5–15)
BUN: 42 mg/dL — ABNORMAL HIGH (ref 6–20)
CO2: 22 mmol/L (ref 22–32)
Calcium: 9.2 mg/dL (ref 8.9–10.3)
Chloride: 102 mmol/L (ref 98–111)
Creatinine, Ser: 3.45 mg/dL — ABNORMAL HIGH (ref 0.44–1.00)
GFR, Estimated: 16 mL/min — ABNORMAL LOW (ref >60)
Glucose, Bld: 257 mg/dL — ABNORMAL HIGH (ref 70–99)
Potassium: 4.2 mmol/L (ref 3.5–5.1)
Sodium: 141 mmol/L (ref 135–145)

## 2020-04-11 LAB — GLUCOSE, CAPILLARY
Glucose-Capillary: 112 mg/dL — ABNORMAL HIGH (ref 70–99)
Glucose-Capillary: 188 mg/dL — ABNORMAL HIGH (ref 70–99)
Glucose-Capillary: 189 mg/dL — ABNORMAL HIGH (ref 70–99)
Glucose-Capillary: 190 mg/dL — ABNORMAL HIGH (ref 70–99)
Glucose-Capillary: 222 mg/dL — ABNORMAL HIGH (ref 70–99)

## 2020-04-11 LAB — CBC WITH DIFFERENTIAL/PLATELET
Abs Immature Granulocytes: 0.6 10*3/uL — ABNORMAL HIGH (ref 0.00–0.07)
Basophils Absolute: 0.1 10*3/uL (ref 0.0–0.1)
Basophils Relative: 0 %
Eosinophils Absolute: 0.3 10*3/uL (ref 0.0–0.5)
Eosinophils Relative: 2 %
HCT: 26.1 % — ABNORMAL LOW (ref 36.0–46.0)
Hemoglobin: 8.2 g/dL — ABNORMAL LOW (ref 12.0–15.0)
Immature Granulocytes: 3 %
Lymphocytes Relative: 9 %
Lymphs Abs: 1.7 10*3/uL (ref 0.7–4.0)
MCH: 28.6 pg (ref 26.0–34.0)
MCHC: 31.4 g/dL (ref 30.0–36.0)
MCV: 90.9 fL (ref 80.0–100.0)
Monocytes Absolute: 0.8 10*3/uL (ref 0.1–1.0)
Monocytes Relative: 5 %
Neutro Abs: 15.1 10*3/uL — ABNORMAL HIGH (ref 1.7–7.7)
Neutrophils Relative %: 81 %
Platelets: 405 10*3/uL — ABNORMAL HIGH (ref 150–400)
RBC: 2.87 MIL/uL — ABNORMAL LOW (ref 3.87–5.11)
RDW: 16 % — ABNORMAL HIGH (ref 11.5–15.5)
WBC: 18.6 10*3/uL — ABNORMAL HIGH (ref 4.0–10.5)
nRBC: 0 % (ref 0.0–0.2)

## 2020-04-11 MED ORDER — HEPARIN SODIUM (PORCINE) 1000 UNIT/ML IJ SOLN
INTRAMUSCULAR | Status: AC
  Filled 2020-04-11: qty 5

## 2020-04-11 MED ORDER — INSULIN ASPART 100 UNIT/ML ~~LOC~~ SOLN
7.0000 [IU] | SUBCUTANEOUS | Status: DC
  Administered 2020-04-11 – 2020-04-28 (×59): 7 [IU] via SUBCUTANEOUS

## 2020-04-11 MED ORDER — FENTANYL CITRATE (PF) 100 MCG/2ML IJ SOLN
100.0000 ug | INTRAMUSCULAR | Status: DC | PRN
  Administered 2020-04-12: 100 ug via INTRAVENOUS
  Administered 2020-04-13 (×2): 50 ug via INTRAVENOUS
  Administered 2020-04-15 – 2020-04-16 (×3): 100 ug via INTRAVENOUS
  Filled 2020-04-11 (×8): qty 2

## 2020-04-11 MED ORDER — INSULIN DETEMIR 100 UNIT/ML ~~LOC~~ SOLN
32.0000 [IU] | Freq: Two times a day (BID) | SUBCUTANEOUS | Status: DC
  Administered 2020-04-11 – 2020-04-14 (×6): 32 [IU] via SUBCUTANEOUS
  Filled 2020-04-11 (×8): qty 0.32

## 2020-04-11 NOTE — Evaluation (Cosign Needed)
[facility] Speaking Valve - Evaluation Patient Details  Name: Wanda Ramirez MRN: [id number] Date of Birth: 26-Jul-1970  Today's Date: 04/11/2020 Time: 0905-0925 SLP Time Calculation (min) (ACUTE ONLY): 20 min  Past Medical History:  Past Medical History:  Diagnosis Date  . Arthritis   . Asthma   . Chronic back pain   . Diabetes mellitus ([place])   . ESRD (end stage renal disease) on dialysis (HCC)    TTS  . Essential hypertension   . GERD (gastroesophageal reflux disease)   . Glaucoma   . Hyperlipidemia   . Hypothyroidism   . Morbid obesity ([place])   . Peripheral neuropathy   . Sleep apnea    wears CPAP, does not know setting   Past Surgical History:  Past Surgical History:  Procedure Laterality Date  . AV FISTULA PLACEMENT Left 04/2014  . CARDIAC CATHETERIZATION N/A 03/17/2016   Procedure: Left Heart Cath and Coronary Angiography;  Surgeon: [name], MD;  Location: [facility];  Service: Cardiovascular;  Laterality: N/A;  . INCISION AND DRAINAGE PERIRECTAL ABSCESS N/A 04/03/2020   Procedure: EXCISIONAL DEBRIDEMENT OF SACRAL AND GLUTEAL WOUNDS;  Surgeon: [name], MD;  Location: [facility];  Service: General;  Laterality: N/A;  . IR FLUORO GUIDE CV LINE LEFT  02/22/2020  . IR FLUORO GUIDE CV LINE LEFT  02/27/2020  . IR US GUIDE VASC ACCESS LEFT  02/22/2020  . KNEE SURGERY    . TUBAL LIGATION     HPI:  49 year old female with PMH of with end-stage renal disease on hemodialysis and chronic lung disease on chronic supplemental oxygen at 3 L/min. Admitted 9/13 with acute on chronic hypoxic respiratory failure due to COVID-19 pneumonia. Intubated 9/24-10/6, reintubated 10/8, CRRT 9/26-10/7, trach and bronch 10/18, trach collar 11/8. PMV trials began 10/27 with minimal toleration, SLP discharged 11/3 due to medical decline.    Assessment / Plan / Recommendation Clinical Impression  Pt was seen for PMV evaluation. Upon entering the room, the pt appeared anxious  and tense. She tolerated initial placement of the PMV without changes in vitals, however, she demonstrated increased work of breathing and shortness of breath. Pt demonstrated difficulty redirecting subglottic air through the upper airway and required mod verbal cues from the SLP in order to attain phonation. Her vocal quality was observed to be hoarse and vocal intensity was reduced, which negatively impacted her intelligbility. With the PMV placed, she demonstrated 2 instances of prolonged coughing and a small amount of secretions were suctioned from the oral cavity. These instances of coughing resulted in the pt's vitals desating and the PMV was removed until vital signs were stabilized. With the PMV placed again, the pt demonstrated little participation in conversation as she intermittently answered questions. However, she is producing more verbalizations than previous sessions, demonstrating improvements in her overall mentation. She also maintained arousal and tolerated placement for a longer period of time than the previous session on 11/1. PMV should be worn with SLP only. SLP will continue to f/u acutely.   SLP Visit Diagnosis: Aphonia (R49.1)    SLP Assessment  Patient needs continued Speech Lanaguage Pathology Services    Follow Up Recommendations  Skilled Nursing facility    Frequency and Duration min 2x/week  2 weeks    PMSV Trial PMSV was placed for: 10 minutes Able to redirect subglottic air through upper airway: Yes Able to Attain Phonation: Yes Voice Quality: Hoarse;Low vocal intensity Able to Expectorate Secretions: Yes Level of Secretion Expectoration  with PMSV: Oral Breath Support for Phonation: Mildly decreased Intelligibility: Intelligibility reduced Word: 75-100% accurate Phrase: 50-74% accurate Sentence: 50-74% accurate Conversation: 50-74% accurate SpO2 During Trial: 94 % Pulse During Trial: 124 Behavior: Anxious   Tracheostomy Tube       Vent Dependency   FiO2 (%): 28 %    Cuff Deflation Trial  GO Tolerated Cuff Deflation: Yes Length of Time for Cuff Deflation Trial: 20 minutes Behavior: Alert;Anxious;Quiet;Tense        Greggory Keen 04/11/2020, 10:41 AM

## 2020-04-11 NOTE — Progress Notes (Signed)
[facility] Progress Note   OP HD:TTS 4h 13mn 450/800 2/2.25 bath 111.5kg Hep 2000 L AVF - darbe 50 ug q week, last 9/7 - calc 1.0 tiw - 9/9 Hb 10.0, tsat 22%  49yo female w/ ESRD on TTS HD admitted for COVID PNA on 02/13/20. She was intubated. SP CRRT around 9/26- 10/7. Now is on intermittent HD. She is sp trach. She had MSSA pna. DM2.  Assessment/ Plan:   1. COVID pna /sp trach - now on TC and in SDU; appears plans being made for G tube 2. Septic shock - resolved, off pressors. 3. Sacral decub w/ dermal necrosis - sp I&D 11/2 and again 11/5 by gen surg. Off abx for now. 4. S/P MSSA HCAP/ VAP 5. ESRD -usual HD TTS.sp CRRT 9/26- 10/7.Last HD 11/8 UF 2.5L, plan next 11/10. 4. BP/ volume - on midodrine 10 tid. Some dependent hip edema bilat. Wt's improving. UF w/ HD as tolerated 5. HD access: LUA AVF clotted while here. TDC x2 here by IR.LIJ TC 6. Anemia ckd/ critical illness - on darbe 150 weekly, tx prbc's prn - Hb in the mid 8s.  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 Phos 3.9 10/30 > 9.2 11/8 --> would like RD input on changing TF v adding binder 8. DM2 - per pmd   LJannifer HickMD [facility] Pager [phone]  Subjective:   Moved to SDU overnight  Plans for G tube this week it appears On for HD today plans to come to HD unit.    Objective:   BP 121/71 (BP Location: Right Arm)   Pulse (!) 122   Temp 99.5 F (37.5 C) (Axillary)   Resp 20   Ht 5' 2" (1.575 m)   Wt 84.8 kg   SpO2 97%   BMI 34.19 kg/m   Intake/Output Summary (Last 24 hours) at 04/11/2020 0835 Last data filed at 04/11/2020 05176Gross per 24 hour  Intake 800 ml  Output 1200 ml  Net -400 ml   Weight change: -0.2 kg  Physical Exam: Awake but not really participating NG in , +trach collar  Lungs - very coarse, loose rhonchi, coughing less today, no rales CVS-tachy ABD- BS present soft non-distended  EXT- trace pedal edema, lt  arm access thrombosed ACCESS: LIJ TC  Imaging: No results found.  Labs: BMET Recent Labs  Lab 04/05/20 0601 04/06/20 0508 04/07/20 0416 04/08/20 1015 04/09/20 0917 04/10/20 0518  NA 135 134* 136 139 140 141  K 4.2 5.4* 4.1 4.4 4.7 4.2  CL 98 98 99 101 101 102  CO2 24 21* 24 23 20* 22  GLUCOSE 156* 199* 285* 291* 249* 257*  BUN 48* 58* 35* 56* 75* 42*  CREATININE 3.30* 4.33* 3.00* 4.33* 5.30* 3.45*  CALCIUM 9.4 9.0 8.9 8.8* 9.0 9.2  PHOS  --   --   --   --  9.2*  --    CBC Recent Labs  Lab 04/07/20 0416 04/07/20 0416 04/07/20 1820 04/08/20 0545 04/09/20 1025 04/10/20 0518  WBC 18.0*  --  18.9*  --  16.9* 20.9*  HGB 7.2*   < > 6.7* 9.0* 8.3* 8.6*  HCT 22.1*   < > 20.4* 26.8* 25.8* 27.1*  MCV 87.4  --  86.4  --  88.4 89.4  PLT 231  --  250  --  303 353   < > = values in this interval not displayed.  Medications:    . sodium chloride   Intravenous Once  . sodium chloride   Intravenous Once  . B-complex with vitamin C  1 tablet Per Tube Daily  . chlorhexidine gluconate (MEDLINE KIT)  15 mL Mouth Rinse BID  . Chlorhexidine Gluconate Cloth  6 each Topical Q0600  . clonazepam  0.5 mg Per Tube TID  . darbepoetin (ARANESP) injection - DIALYSIS  150 mcg Intravenous Q Wed-HD  . feeding supplement (PROSource TF)  45 mL Per Tube QID  . fiber  1 packet Per Tube BID  . heparin injection (subcutaneous)  5,000 Units Subcutaneous Q8H  . hydrocerin   Topical Daily  . insulin aspart  0-20 Units Subcutaneous Q4H  . insulin aspart  5 Units Subcutaneous Q4H  . insulin detemir  30 Units Subcutaneous BID  . levothyroxine  125 mcg Per Tube Q0600  . mouth rinse  15 mL Mouth Rinse 10 times per day  . midodrine  10 mg Per Tube TID WC  . oxyCODONE  10 mg Per Tube Q6H  . pantoprazole sodium  40 mg Per Tube BID  . polyethylene glycol  17 g Per Tube Daily  . QUEtiapine  100 mg Per Tube BID  . sodium chloride flush  10-40 mL Intracatheter Q12H

## 2020-04-11 NOTE — Progress Notes (Signed)
ICU course: Wanda Ramirez is a 49 y.o. with PMH of chronic hypoxic resp failure on 3L, OSA, Morbid obesity, Hypothyroidism, HTN, GERD, ESRD MWF, T2DM admit for COVID pneumonia on hospital day 57. She initially presented with acute hypoxic respiratory failure and transitioned to ICU for ventilator support. Hospital course prolonged due to inability to wean off mechanical ventilation and she is now s/p tracheostomy. Now stable to transfer out of ICU. Time course of other significant events listed below:  9/14 Admission to general floor 9/26 Transferred to ICU for hypotension and CRRT 10/1 On CRRT and vasopressors 10/6 Extubated 10/16 Moved off COVID floor 10/18 tracheostomy performed 10/31 Restart vanc for staph in resp culture 11/2 Surgical debridement for worsening sacral wound 11/7 Off pressors 11/8 Off antibiotics, surgery sign off   Subjective:   Wanda Ramirez was examined and evaluated at bedside this am. She was noted to appear uncomfortable. She mentions pain with movement but otherwise feels ok. Endorsing some cough but denies any dyspnea.  Objective:  Vital signs in last 24 hours: Vitals:   04/10/20 2312 04/11/20 0103 04/11/20 0301 04/11/20 0504  BP:    121/71  Pulse: (!) 105  (!) 125 (!) 125  Resp: (!) 22  (!) 24 (!) 22  Temp:    99.5 F (37.5 C)  TempSrc:    Axillary  SpO2: 94%  94% 97%  Weight:  84.8 kg    Height:       Gen: Well-developed, chronically ill-appearing, agitated HEENT: NCAT head, EOMI, Dry mucous membranes Neck: trach site intact with no surrounding drainage, erythema or edema CV: Tachycardic, regular rhythm, S1, S2 normal, No rubs, no murmurs, no gallops Pulm: Bilateral crackles, frequent cough with clear sputum, no wheezes Abd: Soft, BS+, Diffuse tenderness to palpation Extm: ROM intact, Peripheral pulses intact, No peripheral edema Skin: Dry, Warm, large, deep sacral ulcer with sanguinous drainage with peripheral tenderness. Neuro: 2/5 strength  bilateral upper and lower extremities, exquisitely tender bilateral lower extremities to palpation and movement Psych: Appear anxious  Assessment/Plan:  Principal Problem:   COVID-19 Active Problems:   End stage renal disease on dialysis (HCC)   Diabetes mellitus type 2 in obese (HCC)   OSA (obstructive sleep apnea)   HCAP (healthcare-associated pneumonia)   Acute respiratory failure with hypoxemia (HCC)   Encephalopathy acute   ARDS (adult respiratory distress syndrome) (HCC)   Pressure injury of skin   Aspiration into airway   Status post tracheostomy ([place])   Fever   Septic shock ([place])   Cellulitis of buttock  Wanda Ramirez is a 49 y.o. with PMH of chronic hypoxic resp failure on 3L, OSA, Morbid obesity, Hypothyroidism, HTN, GERD, ESRD MWF, T2DM admit for COVID pneumonia. Hospital course complicated by staph pneumonia and infected sacral wound.  Goals of Care Conversation Chart review shows multiple family discussion with surgery, nephrology and PCCM. Notable conversation on 04/03/20 after clinical decompensation with transition to DNR and comfort care. However, family decided to revert to full scope after additional family discussion. Currently maintains DNR code status but would like to proceed with full scope. Currently getting nutrition through NG tube. Requesting G-tube when able. IR awaiting resolution of leukocytosis. - C/w goals of care discussion - IR to place G-tube when leukocytosis improves  Acute on Chronic Hypoxic Respiratory Failure 2/2 ARDS from COVID-19 and MSSA pneumonia S/p Trach 10/18. Off mechanical ventilation 04/09/20. Currently satting 95 on 5L. Likely new baseline. Unclear if able to wean considering prolonged intubation hx during hospitalization.  Unable to tolerate PMSV due to respiratory distress.  - Trend cbc - Wean off oxygen if able - C/w trach care - Albuterol nebs prn for wheezing, dypsnea.  Critical Illness Neuropathy Acute metabolic  encephalopathy Alert, able to follow directions but appear agitated. Noted to have significant tenderness on exam with weakness in all extremities. Likely due to deconditioning and ICU-related neuropathy. On oxycodone and fentanyl for pain control. Noted prior adverse reaction to dilaudid in 2015. On klonopin, seroquel for agitation - C/w dialysis per nephro - Nursing staff unable to provide as frequent pain meds as ICU on general floor - C/w seroquel, klonopin, oxycodone. Fentanyl for breakthrough.  Sacral decubitus wound with dermal necrosis Leukocytosis S/p debridement on 04/03/20. Wound culture growing ESBL+ E.coli, bacteroids, enterococcus faecalis. Received 7 day course of Zosyn. Surgery signed off. Wound care on board. On exam, large, deep tender sacral ulcer with sanguinous drainage. Noted leukocytosis improving without further abx therapy. Wbc 20.9->18.6. Afebrile. Tachycardic but bp stable. Repeat blood culture collected 04/10/20 NGTD. - F/u blood culture - Low threshold to restart abx - Monitor vitals - Frequent repositioning - Dressing changes per woc  ESRD On MWF schedule. Nephrology on board. Needed pressor support with CRRT due to shock during hospitalization. Current bp stable on midodrine. Weight up 84.8kg->107kg.  - Appreciate nephro recs: HD MWF. Plan for HD today - Trend renal fx, electrolytes - Oral hydration via ng tube - C/w midodrine 10mg  TID  Acute on chronic anemia Hx of chronic anemia at baseline hgb ~9. Fluctuating hemoglobin with critical illness, surgical debridement, continued bleeding from sacral wound. Last transfusion 2 units on 04/07/20. Received total of 15 units during hospitalization. Currently stable. Hgb trend 9->8.3->8.6->8.2 - Trend cbc - Assess for bleed - Keep hgb >7 - Aranesp weekly per nephro  T2DM On novolog 5 units q4hr, levemir 30 units BID. Noted persistent hyperglycemia with blood glucose >200. Will need strict glycemic control to prevent  recurrent infection, especially with an open wound. - Increase levemir to 32 units BID - Increase novolog to 7 units q4h - SSI - Glucose checks  Hypothyroidism On levothyroxine 135mcg - C/w current dose  DVT prophx: subqhep Diet: Feeding tube Bowel: Miralax Code: DNR  Prior to Admission Living Arrangement: Home Anticipated Discharge Location: SNF Barriers to Discharge: Medical tx Dispo: Anticipated discharge in approximately 4-5 day(s).   Mosetta Anis, MD 04/11/2020, 6:55 AM Pager: [phone] After 5pm on weekdays and 1pm on weekends: On Call Pager: [phone]

## 2020-04-11 NOTE — Progress Notes (Signed)
Inpatient Diabetes Program Recommendations  AACE/ADA: New Consensus Statement on Inpatient Glycemic Control   Target Ranges:  Prepandial:   less than 140 mg/dL      Peak postprandial:   less than 180 mg/dL (1-2 hours)      Critically ill patients:  140 - 180 mg/dL   Results for Wanda Ramirez, Wanda Ramirez (MRN [id number]) as of 04/11/2020 12:49  Ref. Range 04/11/2020 04:34 04/11/2020 07:54 04/11/2020 12:31  Glucose-Capillary Latest Ref Range: 70 - 99 mg/dL 190 (H) 189 (H) 222 (H)  Results for Wanda Ramirez, Wanda Ramirez (MRN [id number]) as of 04/11/2020 12:49  Ref. Range 04/10/2020 08:04 04/10/2020 11:04 04/10/2020 15:50 04/10/2020 19:42 04/10/2020 21:55 04/10/2020 23:17  Glucose-Capillary Latest Ref Range: 70 - 99 mg/dL 182 (H) 181 (H) 161 (H) 194 (H) 220 (H) 221 (H)   Review of Glycemic Control  Current orders for Inpatient glycemic control: Levemir 30 units BID, Novolog 0-20 units Q4H, Novolog 5 units Q4H for tube feeding coverage; Vital @ 55 ml/hr  Inpatient Diabetes Program Recommendations:     Insulin: Please consider increasing tube feeding coverage to Novolog 7 units Q4H.  Thanks, Barnie Alderman, RN, MSN, CDE Diabetes Coordinator Inpatient Diabetes Program [phone] (Team Pager from 8am to 5pm)

## 2020-04-12 LAB — CBC WITH DIFFERENTIAL/PLATELET
Abs Immature Granulocytes: 0.69 10*3/uL — ABNORMAL HIGH (ref 0.00–0.07)
Basophils Absolute: 0.1 10*3/uL (ref 0.0–0.1)
Basophils Relative: 1 %
Eosinophils Absolute: 0.2 10*3/uL (ref 0.0–0.5)
Eosinophils Relative: 1 %
HCT: 26.7 % — ABNORMAL LOW (ref 36.0–46.0)
Hemoglobin: 8.4 g/dL — ABNORMAL LOW (ref 12.0–15.0)
Immature Granulocytes: 4 %
Lymphocytes Relative: 9 %
Lymphs Abs: 1.6 10*3/uL (ref 0.7–4.0)
MCH: 28.8 pg (ref 26.0–34.0)
MCHC: 31.5 g/dL (ref 30.0–36.0)
MCV: 91.4 fL (ref 80.0–100.0)
Monocytes Absolute: 0.8 10*3/uL (ref 0.1–1.0)
Monocytes Relative: 5 %
Neutro Abs: 14.6 10*3/uL — ABNORMAL HIGH (ref 1.7–7.7)
Neutrophils Relative %: 80 %
Platelets: 427 10*3/uL — ABNORMAL HIGH (ref 150–400)
RBC: 2.92 MIL/uL — ABNORMAL LOW (ref 3.87–5.11)
RDW: 15.9 % — ABNORMAL HIGH (ref 11.5–15.5)
WBC: 18 10*3/uL — ABNORMAL HIGH (ref 4.0–10.5)
nRBC: 0 % (ref 0.0–0.2)

## 2020-04-12 LAB — COMPREHENSIVE METABOLIC PANEL
ALT: 27 U/L (ref 0–44)
AST: 31 U/L (ref 15–41)
Albumin: 1.6 g/dL — ABNORMAL LOW (ref 3.5–5.0)
Alkaline Phosphatase: 403 U/L — ABNORMAL HIGH (ref 38–126)
Anion gap: 16 — ABNORMAL HIGH (ref 5–15)
BUN: 36 mg/dL — ABNORMAL HIGH (ref 6–20)
CO2: 25 mmol/L (ref 22–32)
Calcium: 9.2 mg/dL (ref 8.9–10.3)
Chloride: 99 mmol/L (ref 98–111)
Creatinine, Ser: 3.18 mg/dL — ABNORMAL HIGH (ref 0.44–1.00)
GFR, Estimated: 17 mL/min — ABNORMAL LOW (ref >60)
Glucose, Bld: 143 mg/dL — ABNORMAL HIGH (ref 70–99)
Potassium: 4.1 mmol/L (ref 3.5–5.1)
Sodium: 140 mmol/L (ref 135–145)
Total Bilirubin: 0.9 mg/dL (ref 0.3–1.2)
Total Protein: 6.6 g/dL (ref 6.5–8.1)

## 2020-04-12 LAB — GLUCOSE, CAPILLARY
Glucose-Capillary: 165 mg/dL — ABNORMAL HIGH (ref 70–99)
Glucose-Capillary: 159 mg/dL — ABNORMAL HIGH (ref 70–99)
Glucose-Capillary: 196 mg/dL — ABNORMAL HIGH (ref 70–99)
Glucose-Capillary: 160 mg/dL — ABNORMAL HIGH (ref 70–99)
Glucose-Capillary: 119 mg/dL — ABNORMAL HIGH (ref 70–99)

## 2020-04-12 LAB — GAMMA GT: GGT: 136 U/L — ABNORMAL HIGH (ref 7–50)

## 2020-04-12 LAB — MAGNESIUM: Magnesium: 2.2 mg/dL (ref 1.7–2.4)

## 2020-04-12 LAB — PHOSPHORUS: Phosphorus: 6.5 mg/dL — ABNORMAL HIGH (ref 2.5–4.6)

## 2020-04-12 MED ORDER — HEPARIN SODIUM (PORCINE) 5000 UNIT/ML IJ SOLN: 5000.0000 [IU] | Freq: Three times a day (TID) | INTRAMUSCULAR | Status: DC

## 2020-04-12 MED ORDER — HEPARIN SODIUM (PORCINE) 5000 UNIT/ML IJ SOLN
5000.0000 [IU] | Freq: Three times a day (TID) | INTRAMUSCULAR | Status: DC
  Administered 2020-04-12 – 2020-04-15 (×8): 5000 [IU] via SUBCUTANEOUS
  Filled 2020-04-12 (×8): qty 1

## 2020-04-12 MED ORDER — FREE WATER
200.0000 mL | Status: DC
  Administered 2020-04-12 – 2020-04-13 (×6): 200 mL

## 2020-04-12 MED ORDER — SODIUM CHLORIDE 0.9 % IV SOLN
500.0000 mg | INTRAVENOUS | Status: DC
  Administered 2020-04-12 – 2020-04-13 (×2): 500 mg via INTRAVENOUS
  Filled 2020-04-12 (×3): qty 0.5

## 2020-04-12 NOTE — Plan of Care (Signed)
Pt wound not healing.

## 2020-04-12 NOTE — Progress Notes (Addendum)
  Possible perc G tube in IR 11/12 IR planning to check wbc in am If trending down---- may be able to move ahead with G tube tomorrow  PA will check in am and speak to RN Will make plans accordingly.  Will keep npo- to include tube feeds or free water Will hold hep inj    ADDENDUM:  Just rechecked chart  Now with 100.1 fever.  Will HOLD til next week on G tube

## 2020-04-12 NOTE — Plan of Care (Signed)
  Problem: Respiratory: Goal: Will maintain a patent airway Outcome: Progressing   Problem: Respiratory: Goal: Complications related to the disease process, condition or treatment will be avoided or minimized Outcome: Progressing   Problem: Coping: Goal: Level of anxiety will decrease Outcome: Progressing

## 2020-04-12 NOTE — Progress Notes (Addendum)
NAME:  Wanda Ramirez, MRN:  [id number], DOB:  1970-07-20, LOS: 74 ADMISSION DATE:  02/13/2020   Brief History  36 yof with ESRD on HD admitted on 02/13/20 for COVID 19 pneumonia which progressed to respiratory failure requiring intubation on 10/8. Unfortunately, she was unable to be weaned from the ventilator and tracheostomy was subsequently placed on 10/18. Her hospitalization has been complicated by septic shock requiring pressor support. Additionally complicated by a sacral decubitus ulcer infection for which she underwent a debridement for on 11/2.  Subjective   No significant overnight events. Denies pain this morning.  Significant Hospital Events   9/14 Admission to general floor 9/23 Tunneled HD cath placed 9/26 Transferred to ICU for hypotension and CRRT 10/1 On CRRT and vasopressors 10/6 Extubated 10/16 Moved off COVID floor 10/18 tracheostomy performed 10/31 Restart vanc for staph in resp culture 11/2 Surgical debridement for worsening sacral wound 11/7 Off pressors 11/8 Off antibiotics, surgery sign off  11/10 IMTS to assume care  Objective   Blood pressure 113/86, pulse (!) 107, temperature 99.7 F (37.6 C), temperature source Axillary, resp. rate (!) 24, height 5\' 2"  (1.575 m), weight 107 kg, SpO2 100 %.     Intake/Output Summary (Last 24 hours) at 04/12/2020 0642 Last data filed at 04/11/2020 1903 Gross per 24 hour  Intake --  Output 2500 ml  Net -2500 ml   Filed Weights   04/10/20 2057 04/11/20 0103 04/11/20 1529  Weight: 109.5 kg 84.8 kg 107 kg    Examination: General: no acute distress Cardiac: tachycardic rate, regular rhythm Pulm: on 5L via trach mask. Upper respiratory sounds appreciable on auscultation   Consults:  Nephrology General surgery  Significant Diagnostic Tests:  9/13 CXR>> bilateral patchy airspace opacities 9/21 VQ scan>> no PE 9/29 echocardiogram>> normal LVEF, findings consistent with increased RV volume and fluid overload,  moderately reduced RV function 10/28 CT abdomen/pelvis >>scattered ground glass and consolidative opacities in the lung bases. Cystic space in the anterior, subpleural RML with septations favoring complicated pneumatocele. Non-obstructive punctuate nephrolithiasis 11/1 CT abdomen/pelvis>> persistent multifocal bibasilar airspace disease with scarring. Bullous changes seen in RML. Decubitus ulcer overlying the sacrococcygeal junction with extensive gas seen in the left gluteal region involving the muscle and fat. No evidence of abscess. Findings consistent with cellulitis and myositis. No evidence of body destruction to suggest osteomyelitis.  Micro Data:  9/13 blood culture>>no growth 9/26 blood culture>> no growth 10/8 resp culture>>MSSA, resistant to clinda, erythromycin, clindamycin 10/8 blood culture>> no growth 10/18 resp culture>> normal resp flora 10/21 resp culture>> normal resp flora 10/24 blood culture>> no growth 10/25 resp culture>> few cornebacterium 10/29 resp culture>> MSSA, resistant to clinda, erythromycin, clinda 11/2 blood culture>> no growth 11/2 resp culture>> no growth 11/2 wound culture>> e.coli resistant to ampicillin, cephalosporins, bactrim, e. Faecalis pan sensitive 11/9 blood culture>> no growth Antimicrobials:  Remdesivir 9/14>>9/19 Cefepime 9/20>>9/29 Azithromycin 9/20>>9/25 Zosyn 10/8, 11/1>11/8 Vanc 10/8, 10/31>>11/5 Rocephin>>10/10>>10/16  Labs    CBC Latest Ref Rng & Units 04/12/2020 04/11/2020 04/10/2020  WBC 4.0 - 10.5 K/uL 18.0(H) 18.6(H) 20.9(H)  Hemoglobin 12.0 - 15.0 g/dL 8.4(L) 8.2(L) 8.6(L)  Hematocrit 36 - 46 % 26.7(L) 26.1(L) 27.1(L)  Platelets 150 - 400 K/uL 427(H) 405(H) 353   BMP Latest Ref Rng & Units 04/12/2020 04/11/2020 04/10/2020  Glucose 70 - 99 mg/dL 143(H) 244(H) 257(H)  BUN 6 - 20 mg/dL 36(H) 69(H) 42(H)  Creatinine 0.44 - 1.00 mg/dL 3.18(H) 5.12(H) 3.45(H)  Sodium 135 - 145 mmol/L 140 143  141  Potassium 3.5 - 5.1 mmol/L 4.1  5.4(H) 4.2  Chloride 98 - 111 mmol/L 99 105 102  CO2 22 - 32 mmol/L 25 23 22   Calcium 8.9 - 10.3 mg/dL 9.2 9.3 9.2    Summary  53 yof admitted 02/17/20 for COVID-19 pneumonia who developed chronic respiratory failure requiring a tracheostomy. Her hospitalization has been complicated by recurrent respiratory infections as well as the development of a sacral decubitus ulcer. She was transferred to the IMTS 11/10 for ongoing care while awaiting the placement of a PEG tube.  Assessment & Plan:  Principal Problem:   COVID-19 Active Problems:   End stage renal disease on dialysis ([place])   Diabetes mellitus type 2 in obese (HCC)   OSA (obstructive sleep apnea)   HCAP (healthcare-associated pneumonia)   Acute respiratory failure with hypoxemia (HCC)   Encephalopathy acute   ARDS (adult respiratory distress syndrome) (HCC)   Pressure injury of skin   Aspiration into airway   Status post tracheostomy ([place])   Fever   Septic shock (HCC)   Cellulitis of buttock  Acute encephalopathy/ ICU delerium. Seemed to respond appropriately to questions this morning.  Pain management --continue seroquel, klonopin, oxycodone. Fentanyl for breakthrough pain --delirium precautions. Shades open, lights on, up to chair if able during the day, lights off, TV off at night.  ESRD on HD. Management per nephrology. Will touch base with nephrology to discuss whether or not the HD line needs to be continued or if we can remove it.  Right sided heart failure with volume overload as noted on 9/29 echocardiogram. Appears euvolemic today. Recommend repeat echocardiogram at a later date to evaluate baseline cardiac function. Persistent tachycardia since 11/6. Suspect this is multifactorial involving pain, anxiety, possible a component of intravascular depletion, possible infection? She is normotensive at this time so I would favor continuing to monitor for the time being however can consider adding on a beta blocker once  other causes have been ruled out.  Leukocytosis. Suspect this is related to acute illness. Afebrile since 11/6. Although she is high risk for infection via her trach vs sacral ulcer vs multiple lines, I would consider this less likely.  -Will continue to monitor fever curve and hemodynamics.  Anemia of CKD superimposed on anemia of critical illness. stable  Chronic respiratory failure s/p tracheostomy secondary to COVID 19 pneumonia 6.0 shiley XLT cuffed.  Remains on 5L supplemental O2 Plan --trach care --spoke with RT. Will work on transitioning her to cuffless  Sacral decubitus ulcer s/p debridement by gen surgery.  Multi-drug resistant bacteria as noted on prior wound culture.  Plan --continue frequent repositioning --wound care --will touch base with surgery regarding plans moving forward  Type 2 Diabetes. Glucoses at goal. Continue levemir 32U BID, novolog 7U q4h  High risk malnutrition. Will need PEG for ongoing nutrition. Unfortunately, my understanding is that gen surg/IR are hesitant to place it with her leukocytosis. Will touch base with them sometime today for further discussion.  Elevated alkaline phos--suspect cholestasis of critical illness. Will obtain a GGT.   Best practice:  CODE STATUS: DNR Diet: tube feeds DVT for prophylaxis: heparin Social considerations/Family communication: daughter updated Dispo: pending ongoing workup   Mitzi Hansen, MD Internal Medicine Resident PGY-2 [facility] Pager: [phone] 04/12/2020 11:15 AM    Contact info:  Please page me at [phone] from 7am-5pm M-F.  Please use the IMTS after hours pager at [phone] after 5pm M-F and on weekends

## 2020-04-12 NOTE — Progress Notes (Signed)
Pharmacy Antibiotic Note  Wanda Ramirez is a 49 y.o. female admitted on 02/13/2020 with COVID-19 pneumonia.  Patient is ESRD on HD on MWF. Pharmacy has been consulted for meropenem dosing. During this hospitalization patient is s/p sacral decubitus ulcer and underwent I&D on 11/2 with culture found to have ESBL e. Coli, few e. Faecalis, and moderated b. Fragilis (treated with Zosyn from 11/2 to 11/8).   Patient was febrile today with temperature of 100.5 though was an isolated event and patient has had a persistent leukocytosis.   Plan: Start meropenem 500mg  q24h (administered after HD on HD days) Monitor cultures/sensitivities and clinical progression   Height: 5\' 2"  (157.5 cm) Weight: 107 kg (235 lb 14.3 oz) IBW/kg (Calculated) : 50.1  Temp (24hrs), Avg:99.7 F (37.6 C), Min:98.4 F (36.9 C), Max:100.5 F (38.1 C)  Recent Labs  Lab 04/07/20 0416 04/07/20 1820 04/08/20 1015 04/09/20 0917 04/09/20 1025 04/10/20 0518 04/11/20 1130 04/12/20 0520  WBC  --  18.9*  --   --  16.9* 20.9* 18.6* 18.0*  CREATININE   < >  --  4.33* 5.30*  --  3.45* 5.12* 3.18*   < > = values in this interval not displayed.    Estimated Creatinine Clearance: 24.6 mL/min (A) (by C-G formula based on SCr of 3.18 mg/dL (H)).    Allergies  Allergen Reactions  . Hydrocodone Itching and Nausea And Vomiting    Antimicrobials this admission: Meropenem 11/11 >> Vanc 10/8 >>10/10 10/31>>11/6 Zosyn 10/8 >>10/9; 11/2 >>11/8 Ceftriaxone 10/10 >> 10/16 Cefepime 9/20>>9/29 Azithro 9/20>> 9/25  Dose adjustments this admission: N/a  Microbiology results: 9/13 blood culture>>no growth 9/26 blood culture>> no growth 10/8 resp culture>>MSSA, resistant to clinda, erythromycin, clindamycin 10/8 blood culture>> no growth 10/18 resp culture>> normal resp flora 10/21 resp culture>> normal resp flora 10/24 blood culture>> no growth 10/25 resp culture>> few cornebacterium 10/29 resp culture>> MSSA, resistant  to clinda, erythromycin, clinda 11/2 blood culture>> no growth 11/2 resp culture>> no growth 11/2 wound culture>> e.coli resistant to ampicillin, cephalosporins, bactrim, e. Faecalis pan sensitive 11/9 blood culture>> no growth 11/11 blood culture >>   Thank you for allowing pharmacy to be a part of this patient's care.  Cristela Felt, PharmD Clinical Pharmacist  04/12/2020 5:29 PM

## 2020-04-12 NOTE — Progress Notes (Signed)
  Speech Language Pathology Treatment: [name] Speaking valve  Patient Details Name: Wanda Ramirez MRN: [id number] DOB: 07/11/1970 Today's Date: 04/12/2020 Time: 4098-1191 SLP Time Calculation (min) (ACUTE ONLY): 24 min  Assessment / Plan / Recommendation Clinical Impression  Pt seen for continued trials of PMSV with SLP.  Pt's older sister Wanda Ramirez present for this session. Cuff was already deflated.  Pt with rattling breath sounds at baseline.  Pt tolerated valve for 15 minutes without any distress.  There was no backpressure noted. Pt was unable to achieve phonation, and only produced weak cough on cue.  Pt did verbalize rarely with one word responses, but these were whispered/aphonic.  Pt was very lethargic and minimally responsive.  RN reported recent administration of pain medication. SLP removed PMV and placed in closet for use in continued therapy.  RN reports plans to change trach (downsize or cuffless). Hopefully this will improve pt's communication.  Consider placing orders for swallow evaluation if pt tolerates trach change.    Recommend continuing trials of PMV with SLP only.   HPI HPI: 49 year old female with PMH of with end-stage renal disease on hemodialysis and chronic lung disease on chronic supplemental oxygen at 3 L/min. Admitted 9/13 with acute on chronic hypoxic respiratory failure due to COVID-19 pneumonia. Intubated 9/24-10/6, reintubated 10/8, CRRT 9/26-10/7, trach and bronch 10/18, trach collar 11/8. PMV trials began 10/27 with minimal toleration, SLP discharged 11/3 due to medical decline. Reconsulted 11/10. Pt currently has cuffed Shiley 6      SLP Plan  Continue with current plan of care       Recommendations         Patient may use Passy-Muir Speech Valve: with SLP only PMSV Supervision: Full MD: Please consider changing trach tube to : Smaller size;Cuffless         Oral Care Recommendations: Oral care QID Follow up Recommendations: Skilled Nursing  facility SLP Visit Diagnosis: Aphonia (R49.1) Plan: Continue with current plan of care       [place], [place], [place] Office: [phone]  04/12/2020, 12:41 PM

## 2020-04-12 NOTE — Progress Notes (Signed)
[facility] Progress Note   OP HD:TTS 4h 84mn 450/800 2/2.25 bath 111.5kg Hep 2000 L AVF - darbe 50 ug q week, last 9/7 - calc 1.0 tiw - 9/9 Hb 10.0, tsat 22%  49yo female w/ ESRD on TTS HD admitted for COVID PNA on 02/13/20. She was intubated. SP CRRT around 9/26- 10/7. Now is on intermittent HD. She is sp trach. She had MSSA pna. DM2.  Assessment/ Plan:   1. COVID pna /sp trach - now on TC and in SDU; appears plans being made for G tube once leukocytosis improves. 2. Septic shock - resolved, off pressors. 3. Sacral decub w/ dermal necrosis - sp I&D 11/2 and again 11/5 by gen surg. Off abx for now.  With leukocytosis primary watching closely and may reinvolve surgery. 4. S/P MSSA HCAP/ VAP 5. ESRD -usual HD TTS.sp CRRT 9/26- 10/7.Last HD 11/8 UF 2.5L, plan next 11/12. 4. BP/ volume - on midodrine 10 tid. Some dependent hip edema bilat. Wt's improving. UF w/ HD as tolerated 5. HD access: LUA AVF clotted while here. TDC x2 here by IR.LIJ TC 6. Anemia ckd/ critical illness - on darbe 150 weekly, tx prbc's prn - Hb in the mid 8s.  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 Phos 3.9 10/30 > 9.2 11/8 --> 6.5.  would like RD input on changing TF v adding binder 8. DM2 - per pmd   LJannifer HickMD [facility] Pager [phone]  Subjective:   HD yesterday tol 2.5L UF.  Shakes head no to question of anything bothering this AM.    Objective:   BP 113/86 (BP Location: Right Leg)   Pulse (!) 107   Temp 99.7 F (37.6 C) (Axillary)   Resp (!) 24   Ht '5\' 2"'  (1.575 m)   Wt 107 kg   SpO2 100%   BMI 43.15 kg/m   Intake/Output Summary (Last 24 hours) at 04/12/2020 0732 Last data filed at 04/11/2020 1903 Gross per 24 hour  Intake --  Output 2500 ml  Net -2500 ml   Weight change: -2.5 kg  Physical Exam: Awake, will localize to me and attend to conversation , +trach collar  Lungs - very coarse, loose rhonchi, coughing  less today, no rales CVS-tachy ABD- BS present soft non-distended  EXT- trace pedal edema, lt arm access thrombosed ACCESS: LIJ TC  Imaging: No results found.  Labs: BMET Recent Labs  Lab 04/06/20 0508 04/07/20 0416 04/08/20 1015 04/09/20 0917 04/10/20 0518 04/11/20 1130 04/12/20 0520  NA 134* 136 139 140 141 143 140  K 5.4* 4.1 4.4 4.7 4.2 5.4* 4.1  CL 98 99 101 101 102 105 99  CO2 21* 24 23 20* '22 23 25  ' GLUCOSE 199* 285* 291* 249* 257* 244* 143*  BUN 58* 35* 56* 75* 42* 69* 36*  CREATININE 4.33* 3.00* 4.33* 5.30* 3.45* 5.12* 3.18*  CALCIUM 9.0 8.9 8.8* 9.0 9.2 9.3 9.2  PHOS  --   --   --  9.2*  --   --  6.5*   CBC Recent Labs  Lab 04/09/20 1025 04/10/20 0518 04/11/20 1130 04/12/20 0520  WBC 16.9* 20.9* 18.6* 18.0*  NEUTROABS  --   --  15.1* 14.6*  HGB 8.3* 8.6* 8.2* 8.4*  HCT 25.8* 27.1* 26.1* 26.7*  MCV 88.4 89.4 90.9 91.4  PLT 303 353 405* 427*    Medications:    . sodium chloride   Intravenous Once  .  sodium chloride   Intravenous Once  . B-complex with vitamin C  1 tablet Per Tube Daily  . chlorhexidine gluconate (MEDLINE KIT)  15 mL Mouth Rinse BID  . Chlorhexidine Gluconate Cloth  6 each Topical Q0600  . clonazepam  0.5 mg Per Tube TID  . darbepoetin (ARANESP) injection - DIALYSIS  150 mcg Intravenous Q Wed-HD  . feeding supplement (PROSource TF)  45 mL Per Tube QID  . fiber  1 packet Per Tube BID  . heparin injection (subcutaneous)  5,000 Units Subcutaneous Q8H  . hydrocerin   Topical Daily  . insulin aspart  0-20 Units Subcutaneous Q4H  . insulin aspart  7 Units Subcutaneous Q4H  . insulin detemir  32 Units Subcutaneous BID  . levothyroxine  125 mcg Per Tube Q0600  . mouth rinse  15 mL Mouth Rinse 10 times per day  . midodrine  10 mg Per Tube TID WC  . oxyCODONE  10 mg Per Tube Q6H  . pantoprazole sodium  40 mg Per Tube BID  . polyethylene glycol  17 g Per Tube Daily  . QUEtiapine  100 mg Per Tube BID  . sodium chloride flush  10-40 mL  Intracatheter Q12H

## 2020-04-13 DIAGNOSIS — U071 COVID-19: Secondary | ICD-10-CM | POA: Diagnosis not present

## 2020-04-13 LAB — COMPREHENSIVE METABOLIC PANEL
ALT: 25 U/L (ref 0–44)
AST: 25 U/L (ref 15–41)
Albumin: 1.5 g/dL — ABNORMAL LOW (ref 3.5–5.0)
Alkaline Phosphatase: 316 U/L — ABNORMAL HIGH (ref 38–126)
Anion gap: 18 — ABNORMAL HIGH (ref 5–15)
BUN: 60 mg/dL — ABNORMAL HIGH (ref 6–20)
CO2: 22 mmol/L (ref 22–32)
Calcium: 9.4 mg/dL (ref 8.9–10.3)
Chloride: 98 mmol/L (ref 98–111)
Creatinine, Ser: 4.55 mg/dL — ABNORMAL HIGH (ref 0.44–1.00)
GFR, Estimated: 11 mL/min — ABNORMAL LOW (ref >60)
Glucose, Bld: 140 mg/dL — ABNORMAL HIGH (ref 70–99)
Potassium: 4.8 mmol/L (ref 3.5–5.1)
Sodium: 138 mmol/L (ref 135–145)
Total Bilirubin: 0.8 mg/dL (ref 0.3–1.2)
Total Protein: 5.9 g/dL — ABNORMAL LOW (ref 6.5–8.1)

## 2020-04-13 LAB — CBC
HCT: 24.8 % — ABNORMAL LOW (ref 36.0–46.0)
Hemoglobin: 7.9 g/dL — ABNORMAL LOW (ref 12.0–15.0)
MCH: 29 pg (ref 26.0–34.0)
MCHC: 31.9 g/dL (ref 30.0–36.0)
MCV: 91.2 fL (ref 80.0–100.0)
Platelets: 434 10*3/uL — ABNORMAL HIGH (ref 150–400)
RBC: 2.72 MIL/uL — ABNORMAL LOW (ref 3.87–5.11)
RDW: 15.6 % — ABNORMAL HIGH (ref 11.5–15.5)
WBC: 17.5 10*3/uL — ABNORMAL HIGH (ref 4.0–10.5)
nRBC: 0 % (ref 0.0–0.2)

## 2020-04-13 LAB — GLUCOSE, CAPILLARY
Glucose-Capillary: 130 mg/dL — ABNORMAL HIGH (ref 70–99)
Glucose-Capillary: 134 mg/dL — ABNORMAL HIGH (ref 70–99)
Glucose-Capillary: 139 mg/dL — ABNORMAL HIGH (ref 70–99)
Glucose-Capillary: 202 mg/dL — ABNORMAL HIGH (ref 70–99)
Glucose-Capillary: 81 mg/dL (ref 70–99)
Glucose-Capillary: 97 mg/dL (ref 70–99)

## 2020-04-13 LAB — IRON AND TIBC
Iron: 24 ug/dL — ABNORMAL LOW (ref 28–170)
Saturation Ratios: 11 % (ref 10.4–31.8)
TIBC: 211 ug/dL — ABNORMAL LOW (ref 250–450)
UIBC: 187 ug/dL

## 2020-04-13 LAB — FERRITIN: Ferritin: 4162 ng/mL — ABNORMAL HIGH (ref 11–307)

## 2020-04-13 MED ORDER — HEPARIN SODIUM (PORCINE) 1000 UNIT/ML IJ SOLN
INTRAMUSCULAR | Status: AC
  Administered 2020-04-13: 1000 [IU]
  Filled 2020-04-13: qty 5

## 2020-04-13 MED ORDER — PROSOURCE TF PO LIQD
45.0000 mL | Freq: Three times a day (TID) | ORAL | Status: DC
  Administered 2020-04-13 – 2020-04-30 (×50): 45 mL
  Filled 2020-04-13 (×47): qty 45

## 2020-04-13 MED ORDER — FREE WATER
20.0000 mL | Freq: Four times a day (QID) | Status: DC
  Administered 2020-04-13 – 2020-05-04 (×75): 20 mL

## 2020-04-13 MED ORDER — NEPRO/CARBSTEADY PO LIQD
1000.0000 mL | ORAL | Status: DC
  Administered 2020-04-13 – 2020-04-30 (×9): 1000 mL
  Filled 2020-04-13 (×22): qty 1000

## 2020-04-13 MED ORDER — JUVEN PO PACK
1.0000 | PACK | Freq: Two times a day (BID) | ORAL | Status: DC
  Administered 2020-04-13 – 2020-05-04 (×40): 1
  Filled 2020-04-13 (×38): qty 1

## 2020-04-13 NOTE — Progress Notes (Signed)
#  6 XLT cuffless ordered yesterday by unit secretary. Wanda Ramirez has not arrived yet. Will change trach when new one arrives.

## 2020-04-13 NOTE — Plan of Care (Signed)
°  Problem: Education: Goal: Knowledge of risk factors and measures for prevention of condition will improve Outcome: Progressing   Problem: Coping: Goal: Psychosocial and spiritual needs will be supported Outcome: Progressing   Problem: Respiratory: Goal: Will maintain a patent airway Outcome: Progressing Goal: Complications related to the disease process, condition or treatment will be avoided or minimized Outcome: Progressing   Problem: Education: Goal: Knowledge of General Education information will improve Description: Including pain rating scale, medication(s)/side effects and non-pharmacologic comfort measures Outcome: Progressing   Problem: Health Behavior/Discharge Planning: Goal: Ability to manage health-related needs will improve Outcome: Progressing   Problem: Clinical Measurements: Goal: Ability to maintain clinical measurements within normal limits will improve Outcome: Progressing Goal: Will remain free from infection Outcome: Progressing Goal: Diagnostic test results will improve Outcome: Progressing Goal: Respiratory complications will improve Outcome: Progressing Goal: Cardiovascular complication will be avoided Outcome: Progressing   Problem: Activity: Goal: Risk for activity intolerance will decrease Outcome: Progressing   Problem: Nutrition: Goal: Adequate nutrition will be maintained Outcome: Progressing   Problem: Coping: Goal: Level of anxiety will decrease Outcome: Progressing   Problem: Elimination: Goal: Will not experience complications related to bowel motility Outcome: Progressing   Problem: Pain Managment: Goal: General experience of comfort will improve Outcome: Progressing   Problem: Safety: Goal: Ability to remain free from injury will improve Outcome: Progressing   Problem: Skin Integrity: Goal: Risk for impaired skin integrity will decrease Outcome: Progressing   Problem: Activity: Goal: Ability to tolerate increased  activity will improve Outcome: Progressing   Problem: Respiratory: Goal: Ability to maintain a clear airway and adequate ventilation will improve Outcome: Progressing   Problem: Role Relationship: Goal: Method of communication will improve Outcome: Progressing   Problem: Education: Goal: Knowledge of disease and its progression will improve Outcome: Progressing Goal: Individualized Educational Video(s) Outcome: Progressing   Problem: Fluid Volume: Goal: Compliance with measures to maintain balanced fluid volume will improve Outcome: Progressing   Problem: Health Behavior/Discharge Planning: Goal: Ability to manage health-related needs will improve Outcome: Progressing   Problem: Nutritional: Goal: Ability to make healthy dietary choices will improve Outcome: Progressing   Problem: Clinical Measurements: Goal: Complications related to the disease process, condition or treatment will be avoided or minimized Outcome: Progressing   Problem: Activity: Goal: Ability to tolerate increased activity will improve Outcome: Progressing   Problem: Respiratory: Goal: Ability to maintain a clear airway and adequate ventilation will improve Outcome: Progressing   Problem: Role Relationship: Goal: Method of communication will improve Outcome: Progressing

## 2020-04-13 NOTE — Progress Notes (Signed)
Surgeons came to view wound on buttocks and will talk to family for future surgical debridement.

## 2020-04-13 NOTE — Progress Notes (Signed)
Paged by RN earlier this evening regarding patient's sister, Ms Allena Earing, requesting to speak to a physician. She expressed concerns regarding surgery plans and the surgeon's discussion with patient's daughter. Ms Twanna Hy expressed her disappointment in management of patient's condition and is concerned that her sacral ulcer is a result of her not being repositioned frequently during this admission. I provided reassurance. I discussed that per surgery notes, the plan is for hydrotherapy and continued IV antibiotics. Discussed that if there is no improvement with hydrotherapy, then may consider repeat debridement in OR. Ms Twanna Hy has concerns regarding this and would like to further discuss with surgery team.

## 2020-04-13 NOTE — Plan of Care (Signed)
°  Problem: Respiratory: Goal: Will maintain a patent airway Outcome: Progressing   Problem: Respiratory: Goal: Complications related to the disease process, condition or treatment will be avoided or minimized Outcome: Progressing   Problem: Clinical Measurements: Goal: Respiratory complications will improve Outcome: Progressing   Problem: Clinical Measurements: Goal: Cardiovascular complication will be avoided Outcome: Not Progressing  Pt remains ST

## 2020-04-13 NOTE — Progress Notes (Signed)
[facility] Progress Note   OP HD:TTS 4h 52mn 450/800 2/2.25 bath 111.5kg Hep 2000 L AVF - darbe 50 ug q week, last 9/7 - calc 1.0 tiw - 9/9 Hb 10.0, tsat 22%  49yo female w/ ESRD on TTS HD admitted for COVID PNA on 02/13/20. She was intubated. SP CRRT around 9/26- 10/7. Now is on intermittent HD. She is sp trach. She had MSSA pna. DM2.  Assessment/ Plan:   1. COVID pna /sp trach - now on TC and in SDU; appears plans being made for G tube once leukocytosis improves perhaps as early as today. 2. Sacral decub w/ dermal necrosis - sp I&D 11/2 and again 11/5 by gen surg. Off abx for now. Primary considering reinvolving surgery to look at sacral wound. 3. S/P MSSA HCAP/ VAP 4. ESRD -usual HD TTS.sp CRRT 9/26- 10/7. HD today 11/12, plan next will be 11/15 4. BP/ volume - on midodrine 10 tid. Some dependent hip edema bilat. Wt's improving. UF w/ HD as tolerated 5. HD access: LUA AVF clotted while here. TDC x2 here by IR.LIJ TC 6. Anemia ckd/ critical illness - on darbe 150 weekly, tx prbc's prn - Hb in the mid 8s down to 7.9 today.  Recheck ferritin and iron - ferritins had been 4400-7000+ in 02/2020. 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 Phos 3.9 10/30 > 9.2 11/8 --> 6.5.   8. DM2 - per pmd   LJannifer HickMD [facility] Pager [phone]  Subjective:   Seen on HD this AM Shakes head no to any complaints   Objective:   BP 108/80   Pulse (!) 108   Temp (!) 97.3 F (36.3 C) (Axillary)   Resp 20   Ht '5\' 2"'  (1.575 m)   Wt 106 kg   SpO2 99%   BMI 42.74 kg/m   Intake/Output Summary (Last 24 hours) at 04/13/2020 1126 Last data filed at 04/12/2020 1753 Gross per 24 hour  Intake --  Output 525 ml  Net -525 ml   Weight change:   Physical Exam: Awake, will localize to me and attend to conversation , +trach collar  Lungs - very coarse, loose rhonchi, coughing less today, no rales CVS-tachy ABD- BS present soft  non-distended  EXT- trace pedal edema, lt arm access thrombosed ACCESS: LIJ TC  Imaging: No results found.  Labs: BMET Recent Labs  Lab 04/07/20 0416 04/08/20 1015 04/09/20 0917 04/10/20 0518 04/11/20 1130 04/12/20 0520 04/13/20 0500  NA 136 139 140 141 143 140 138  K 4.1 4.4 4.7 4.2 5.4* 4.1 4.8  CL 99 101 101 102 105 99 98  CO2 24 23 20* '22 23 25 22  ' GLUCOSE 285* 291* 249* 257* 244* 143* 140*  BUN 35* 56* 75* 42* 69* 36* 60*  CREATININE 3.00* 4.33* 5.30* 3.45* 5.12* 3.18* 4.55*  CALCIUM 8.9 8.8* 9.0 9.2 9.3 9.2 9.4  PHOS  --   --  9.2*  --   --  6.5*  --    CBC Recent Labs  Lab 04/10/20 0518 04/11/20 1130 04/12/20 0520 04/13/20 0500  WBC 20.9* 18.6* 18.0* 17.5*  NEUTROABS  --  15.1* 14.6*  --   HGB 8.6* 8.2* 8.4* 7.9*  HCT 27.1* 26.1* 26.7* 24.8*  MCV 89.4 90.9 91.4 91.2  PLT 353 405* 427* 434*    Medications:    . sodium chloride   Intravenous Once  . sodium chloride   Intravenous Once  .  B-complex with vitamin C  1 tablet Per Tube Daily  . chlorhexidine gluconate (MEDLINE KIT)  15 mL Mouth Rinse BID  . Chlorhexidine Gluconate Cloth  6 each Topical Q0600  . clonazepam  0.5 mg Per Tube TID  . darbepoetin (ARANESP) injection - DIALYSIS  150 mcg Intravenous Q Wed-HD  . feeding supplement (PROSource TF)  45 mL Per Tube QID  . fiber  1 packet Per Tube BID  . free water  200 mL Per Tube Q4H  . heparin injection (subcutaneous)  5,000 Units Subcutaneous Q8H  . hydrocerin   Topical Daily  . insulin aspart  0-20 Units Subcutaneous Q4H  . insulin aspart  7 Units Subcutaneous Q4H  . insulin detemir  32 Units Subcutaneous BID  . levothyroxine  125 mcg Per Tube Q0600  . mouth rinse  15 mL Mouth Rinse 10 times per day  . midodrine  10 mg Per Tube TID WC  . oxyCODONE  10 mg Per Tube Q6H  . pantoprazole sodium  40 mg Per Tube BID  . polyethylene glycol  17 g Per Tube Daily  . QUEtiapine  100 mg Per Tube BID  . sodium chloride flush  10-40 mL Intracatheter Q12H

## 2020-04-13 NOTE — Progress Notes (Addendum)
NAME:  KINZE LABO, MRN:  [id number], DOB:  1971-04-21, LOS: 22 ADMISSION DATE:  02/13/2020   Brief History  4 yof with ESRD on HD admitted on 02/13/20 for COVID 19 pneumonia which progressed to respiratory failure requiring intubation on 10/8. Unfortunately, she was unable to be weaned from the ventilator and tracheostomy was subsequently placed on 10/18. Her hospitalization has been complicated by septic shock requiring pressor support. Additionally complicated by a sacral decubitus ulcer infection for which she underwent a debridement for on 11/2.  Interm history/ Subjective   Daughter updated via telephone yesterday afternoon. She asked how the sacral wound is healing. I explained that this is unlikely to heal quickly and more likely not at all unless she becomes more mobile. She inquired into the continued need for tracheostomy. We discussed that we would work toward transitioning to a cuffless however I suspect that she will continue to need this for quite a while if not permanently.   Pt spiked a temp to 100.50F yesterday afternoon.  Afebrile overnight. Tachycardia improved.  Significant Hospital Events   9/14 Admission to general floor 9/23 Tunneled HD cath placed 9/26 Transferred to ICU for hypotension and CRRT 10/1 On CRRT and vasopressors 10/6 Extubated 10/16 Moved off COVID floor 10/18 tracheostomy performed 10/31 Restart vanc for staph in resp culture 11/2 Surgical debridement for worsening sacral wound 11/7 Off pressors 11/8 Off antibiotics, surgery sign off  11/10 IMTS to assume care 11/11 febrile. merrem started. Surgery reconsulted  Objective   Blood pressure 130/76, pulse (!) 108, temperature 98.8 F (37.1 C), temperature source Oral, resp. rate 20, height 5\' 2"  (1.575 m), weight 107 kg, SpO2 100 %.     Intake/Output Summary (Last 24 hours) at 04/13/2020 0724 Last data filed at 04/12/2020 1753 Gross per 24 hour  Intake --  Output 525 ml  Net -525 ml    Filed Weights   04/10/20 2057 04/11/20 0103 04/11/20 1529  Weight: 109.5 kg 84.8 kg 107 kg    Examination: General: no acute distress Cardiac: tachycardic rate, regular rhythm Pulm: on 5L via trach mask. Upper respiratory sounds appreciable on auscultation  Skin: photo's from 11/12        Consults:  Nephrology General surgery  Significant Diagnostic Tests:  9/13 CXR>> bilateral patchy airspace opacities 9/21 VQ scan>> no PE 9/29 echocardiogram>> normal LVEF, findings consistent with increased RV volume and fluid overload, moderately reduced RV function 10/28 CT abdomen/pelvis >>scattered ground glass and consolidative opacities in the lung bases. Cystic space in the anterior, subpleural RML with septations favoring complicated pneumatocele. Non-obstructive punctuate nephrolithiasis 11/1 CT abdomen/pelvis>> persistent multifocal bibasilar airspace disease with scarring. Bullous changes seen in RML. Decubitus ulcer overlying the sacrococcygeal junction with extensive gas seen in the left gluteal region involving the muscle and fat. No evidence of abscess. Findings consistent with cellulitis and myositis. No evidence of body destruction to suggest osteomyelitis.  Micro Data:  9/13 blood culture>>no growth 9/26 blood culture>> no growth 10/8 resp culture>>MSSA, resistant to clinda, erythromycin, clindamycin 10/8 blood culture>> no growth 10/18 resp culture>> normal resp flora 10/21 resp culture>> normal resp flora 10/24 blood culture>> no growth 10/25 resp culture>> few cornebacterium 10/29 resp culture>> MSSA, resistant to clinda, erythromycin, clinda 11/2 blood culture>> no growth 11/2 resp culture>> no growth 11/2 wound culture>> e.coli resistant to ampicillin, cephalosporins, bactrim, e. Faecalis pan sensitive 11/9 blood culture>> no growth 11/11 blood culture>> NGTD Antimicrobials:  Remdesivir 9/14>>9/19 Cefepime 9/20>>9/29 Azithromycin 9/20>>9/25 Zosyn 10/8,  11/1>11/8 Vanc  10/8, 10/31>>11/5 Rocephin>>10/10>>10/16 Merrem 11/11>>  Procedures/lines  Tracheostomy shiley 6.0 XLT 10/18 Sacral wound debridement--11/2  Tunneled HD line 9/27>> Central line 11/2>> Rectal tube 10/2>> Cortrak 10/4>>  Central line 9/26-10/24 A-line 9/30>>10/13, 11/2-11/9 HD line 9/22-9/27 ETT 9/26-10/6 ETT 10/8-10/18  Labs    CBC Latest Ref Rng & Units 04/13/2020 04/12/2020 04/11/2020  WBC 4.0 - 10.5 K/uL 17.5(H) 18.0(H) 18.6(H)  Hemoglobin 12.0 - 15.0 g/dL 7.9(L) 8.4(L) 8.2(L)  Hematocrit 36 - 46 % 24.8(L) 26.7(L) 26.1(L)  Platelets 150 - 400 K/uL 434(H) 427(H) 405(H)   BMP Latest Ref Rng & Units 04/13/2020 04/12/2020 04/11/2020  Glucose 70 - 99 mg/dL 140(H) 143(H) 244(H)  BUN 6 - 20 mg/dL 60(H) 36(H) 69(H)  Creatinine 0.44 - 1.00 mg/dL 4.55(H) 3.18(H) 5.12(H)  Sodium 135 - 145 mmol/L 138 140 143  Potassium 3.5 - 5.1 mmol/L 4.8 4.1 5.4(H)  Chloride 98 - 111 mmol/L 98 99 105  CO2 22 - 32 mmol/L 22 25 23   Calcium 8.9 - 10.3 mg/dL 9.4 9.2 9.3    Summary  62 yof admitted 02/17/20 for COVID-19 pneumonia who developed chronic respiratory failure requiring a tracheostomy. Her hospitalization has been complicated by recurrent respiratory infections as well as the development of a sacral decubitus ulcer. She was transferred to the IMTS 11/10 for ongoing care while awaiting the placement of a PEG tube.  Assessment & Plan:  Principal Problem:   COVID-19 Active Problems:   End stage renal disease on dialysis ([place])   Diabetes mellitus type 2 in obese (HCC)   OSA (obstructive sleep apnea)   HCAP (healthcare-associated pneumonia)   Acute respiratory failure with hypoxemia (HCC)   Encephalopathy acute   ARDS (adult respiratory distress syndrome) (HCC)   Pressure injury of skin   Aspiration into airway   Status post tracheostomy ([place])   Fever   Septic shock (HCC)   Cellulitis of buttock  Anxiety. Responds appropriate to questions by nodding and shaking her  head. --continue seroquel, klonopin --delirium precautions. Shades open, lights on, up to chair if able during the day, lights off, TV off at night.  ESRD on HD THS. AVF clotted off resulting in need for use of tunneled HD line. Management per nephrology.   Anemia of CKD/critical illness Down half a gram since yesterday. Suspect some of this is attributable to her massive sacral wound and associated bleeding. On aranesp, midodrine. Will continue to monitor with daily labs.  Chronic respiratory failure s/p tracheostomy secondary to COVID 19 pneumonia 6.0 shiley XLT cuffed. Worked with SLP who recommends downsizing trach.  Remains on 5L supplemental O2 Plan --trach care --continue to work toward cuffless. Appreciate SLP and RT involvement  SIRS. Suspect sacral decubitus ulcer source however has numerous potential sources--HD line, central line, trach. NGTD on 11/11 blood cultures. Afebrile now. Tachycardia improving. Sacral decubitus ulcer s/p debridement by gen surgery (11/2, 11/5). Unfortunately, unlikely to heal until more mobile and is high risk for infection Multi-drug resistant bacteria as noted on prior wound culture.  Plan --continue merrem (started 11/11) --Spoke with general surgery PA who will discuss with his attending about taking her back to the OR this weekend for another debridement. --will also consult ID given her MDR organisms on prior wound culture --continue frequent repositioning --wound care --Pain management: oxycodone 10mg  q6h, prn fentanyl 160mcg with dressing changes  Type 2 Diabetes. Glucoses at goal. Continue levemir 32U BID, novolog 7U q4h  High risk malnutrition. Will need PEG for ongoing nutrition. Unfortunately, this continues to get  delayed due to her recurrent infections. Unforunately, she is likely to have recurrent infections. --continue tube feeds via cortrak at this time. RD recommending switching to Nepro and to d/c nutrisource fiber. Greatly  appreciate their recommendations. --will re-consult IR once infection resolves  Cholestasis of critical illness. Confirmed with elevated GGT.  Physical deconditioning. PT/OT consulted.  Goals of care. Pt has a poor long term prognosis. She has multisystem organ failure. I spoke with her daughter yesterday however I get the feeling that she may not have a realistic understanding of her mother's severe state and what her long term prognosis looks like. Unfortunately, I think this is complicated by her [place]. Will update the daughter again today to confirm goals of care.  Best practice:  CODE STATUS: DNR Diet: tube feeds DVT for prophylaxis: heparin Social considerations/Family communication: daughter updated Dispo: pending ongoing workup   Mitzi Hansen, MD Internal Medicine Resident PGY-2 [facility] Pager: [phone] 04/13/2020 7:24 AM    Contact info:  Please page me at [phone] from 7am-5pm M-F.  Please use the IMTS after hours pager at [phone] after 5pm M-F and on weekends

## 2020-04-13 NOTE — Evaluation (Addendum)
Physical Therapy Re-Evaluation Patient Details Name: Wanda Ramirez MRN: [id number] DOB: [birth year] Today's Date: 04/13/2020   History of Present Illness  49 yo admitted 9/13 with Covid PNA, intubated 9/24-10/6, reintubated 10/8, CRRT 9/26-10/7, trach and bronch 10/18. Pt with significant sacral wound. PMhx: ESRD TTS, obesity, HTN, GERD, hypothyroidism, DM, asthma  Clinical Impression  Pt appears close to what she was doing for [place] before we signed off, earlier this month.  Pt alert, not oriented to place, now on air mattress due to significant sacral wound and PT found a R heel wound as well (informed RN).  She is total assist with stiff trunk and 4 extremities from prolonged time in bed.  Prevalons applied at the end of the session and RN in room.  Asking for OT to sign back on as well as she has significant tightness in her scapula bil, R UE weaker than L UE.  Pt will need extensive post acute therapy.   PT to follow acutely for deficits listed below.      Follow Up Recommendations LTACH    Equipment Recommendations  Wheelchair (measurements PT);Wheelchair cushion (measurements PT);Hospital bed;Other (comment) (hoyer lift and roho cushion)    Recommendations for Other Services OT consult     Precautions / Restrictions Precautions Precautions: Fall;Other (comment) Precaution Comments: trach, PEG, flexiseal Required Braces or Orthoses: Other Brace Other Brace: bil prevalons (has a pressure blood blister on the back of her R heel.       Mobility  Bed Mobility               General bed mobility comments: Will need two person assist to mobilize EOB.     Transfers                 General transfer comment: Not sure our goal will be OOB to chair given the massive nature of her sacral wound.    Ambulation/Gait                Stairs            Wheelchair Mobility    Modified Rankin (Stroke Patients Only)       Balance                                              Pertinent Vitals/Pain Pain Assessment: Faces Faces Pain Scale: Hurts whole lot Pain Location: generalized with ROM Pain Descriptors / Indicators: Grimacing Pain Intervention(s): Limited activity within patient's tolerance;Monitored during session;Repositioned    Home Living Family/patient expects to be discharged to:: Private residence Living Arrangements: Children Available Help at Discharge: Family;Available PRN/intermittently Type of Home: Apartment Home Access: Level entry     Home Layout: Two level;Bed/bath upstairs;1/2 bath on main level Home Equipment: None Additional Comments: information gathered in chart    Prior Function Level of Independence: Independent         Comments: pt normally cares for herself and drives to/from HD. Pt lives with son who works and has grandchildren     Journalist, newspaper   Dominant Hand: Right    Extremity/Trunk Assessment   Upper Extremity Assessment Upper Extremity Assessment: Defer to OT evaluation    Lower Extremity Assessment Lower Extremity Assessment: Generalized weakness;RLE deficits/detail;LLE deficits/detail RLE Deficits / Details: pt with 2/5 in bil toes and ankles, seems stronger on her left or more rigid from being  tight, often can resist my ROM, but unable to preform some MMT when asked.  LLE Deficits / Details: left leg either tighter or stronger than R leg, resisted Knee flexion and ankle PF likely due to pain.     Cervical / Trunk Assessment Cervical / Trunk Assessment: Kyphotic;Other exceptions Cervical / Trunk Exceptions: very rounded from extended time in bed, scapula are protracted, head is forward and back is rounded.   Communication   Communication: Tracheostomy  Cognition Arousal/Alertness: Awake/alert Behavior During Therapy: Flat affect Overall Cognitive Status: Impaired/Different from baseline Area of Impairment: Orientation;Attention;Memory;Following  commands;Safety/judgement;Awareness;Problem solving                 Orientation Level: Disoriented to;Place (got month and year correct) Current Attention Level: Sustained Memory: Decreased short-term memory Following Commands: Follows one step commands inconsistently;Follows one step commands with increased time Safety/Judgement: Decreased awareness of safety;Decreased awareness of deficits Awareness: Intellectual Problem Solving: Slow processing;Decreased initiation;Difficulty sequencing;Requires verbal cues;Requires tactile cues General Comments: short periods of sustained attention, 75% basic command following with increased processing time.       General Comments      Exercises General Exercises - Upper Extremity Shoulder Flexion: PROM;10 reps;Supine (to 90 degrees as scapular/humeral rythm is significantly off) Elbow Flexion: PROM;Both;10 reps Elbow Extension: PROM;Both;10 reps Wrist Flexion: PROM;Both;10 reps (supination and pronation as well) General Exercises - Lower Extremity Ankle Circles/Pumps: PROM;Both;10 reps;Other (comment) (pt resisting due to pain into plantarflexion) Heel Slides: PROM;Both;10 reps;Other (comment) (easier on the right, resisting on the left. ) Hip ABduction/ADduction: PROM;Both;10 reps   Assessment/Plan    PT Assessment Patient needs continued PT services  PT Problem List Decreased strength;Decreased range of motion;Decreased activity tolerance;Decreased balance;Decreased mobility;Decreased coordination;Decreased cognition;Decreased knowledge of use of DME;Decreased safety awareness;Decreased knowledge of precautions;Cardiopulmonary status limiting activity;Impaired tone;Pain;Decreased skin integrity       PT Treatment Interventions DME instruction;Gait training;Stair training;Functional mobility training;Therapeutic activities;Therapeutic exercise;Balance training;Neuromuscular re-education;Cognitive remediation;Patient/family  education;Wheelchair mobility training;Manual techniques;Modalities    PT Goals (Current goals can be found in the Care Plan section)  Acute Rehab PT Goals Patient Stated Goal: unable to state PT Goal Formulation: Patient unable to participate in goal setting Time For Goal Achievement: 04/27/20 Potential to Achieve Goals: Fair    Frequency Min 2X/week   Barriers to discharge Decreased caregiver support;Inaccessible home environment      Co-evaluation               AM-PAC PT "6 Clicks" Mobility  Outcome Measure Help needed turning from your back to your side while in a flat bed without using bedrails?: Total Help needed moving from lying on your back to sitting on the side of a flat bed without using bedrails?: Total Help needed moving to and from a bed to a chair (including a wheelchair)?: Total Help needed standing up from a chair using your arms (e.g., wheelchair or bedside chair)?: Total Help needed to walk in hospital room?: Total Help needed climbing 3-5 steps with a railing? : Total 6 Click Score: 6    End of Session Equipment Utilized During Treatment: Oxygen (28% TC 5L O2 ) Activity Tolerance: Patient limited by pain Patient left: in bed;with call bell/phone within reach;with nursing/sitter in room   PT Visit Diagnosis: Muscle weakness (generalized) (M62.81);Difficulty in walking, not elsewhere classified (R26.2);Pain Pain - Right/Left:  (generalized) Pain - part of body:  (generalized)    Time: 1829-9371 PT Time Calculation (min) (ACUTE ONLY): 37 min   Charges:   PT Evaluation $PT Re-evaluation:  1 Re-eval PT Treatments $Therapeutic Exercise: 23-37 mins        Verdene Lennert, PT, DPT  Acute Rehabilitation [phone] pager [phone]) [phone] office

## 2020-04-13 NOTE — Progress Notes (Addendum)
Nutrition Follow-up  DOCUMENTATION CODES:   Morbid obesity  INTERVENTION:   Continue tube feeds via Cortrak:  - Change to Nepro at 50 ml/hr (1200 ml/day) - ProSource TF 45 ml TID  Tube feeding regimen at goal provides 2280 kcal, 130 grams of protein, and 872 ml of free water daily.  D/C Nutrisource fiber (Nepro contains 12 gm fiber/L).  Decrease free water flushes to 20 ml every 6 hours.  Add Juven BID via tube, each packet provides 80 calories, 8 grams of carbohydrate, 2.5 grams of protein (collagen), 7 grams of L-arginine and 7 grams of L-glutamine; supplement contains CaHMB, Vitamins C, E, B12 and Zinc to promote wound healing.   NUTRITION DIAGNOSIS:   Inadequate oral intake related to inability to eat as evidenced by NPO status.  Ongoing  GOAL:   Patient will meet greater than or equal to 90% of their needs  Met with TF  MONITOR:   Vent status, Labs, Weight trends, TF tolerance, Skin, I & O's  REASON FOR ASSESSMENT:   Consult Assessment of nutrition requirement/status  ASSESSMENT:   49 year old female with a past medical history significant for end-stage renal disease on hemodialysis MWF, chronic hypoxic respiratory failure on 3 L nasal cannula at baseline, sleep apnea, diabetes, hypertension, hyperlipidemia, hypothyroidism, and asthma presents with complaints of worsening shortness of breath. Previously diagnosed with COVID PNA on 9/9.  Pt with devastating necrotizing soft tissue infection.  Patient remains on trach collar.  Last HD 11/10 with 2.5 L UF Received HD on HD unit this morning. SLP is following for PMV use. Plans for PEG placement next week.  Patient is currently receiving Vital 1.5 via Cortrak at 55 ml/h (1320 ml/day) with Prosource TF 45 ml TID to provide 2100 kcals, 122 gm protein, 1008 ml free water daily.  Free water flushes 200 ml every 4 hours for a total of 2.2 L per day. Will change TF to Nepro to help maintain normal potassium and  phosphorus levels since patient in on HD. Recommend reducing free water flushes to prevent excess volume intake.   Admit weight: 124.7 kg Current weight: 106 kg EDW (per nephrology): 111.5 kg, currently below this weight, need new EDW  Medications reviewed and include: B-complex with vitamin C, Aranesp, nutrisource fiber BID, Novolog SSI q 4 hours, Novolog 7 units q 4 hours, Levemir, Protonix, Miralax.  Labs reviewed. BUN 60, creatinine 4.55, K 4.8 WNL, Phos 6.5 (H) CBG's: 139-134  Rectal tube: 525 ml x 24 hours Anuric  Diet Order:   Diet Order            Diet NPO time specified  Diet effective now                 EDUCATION NEEDS:   Not appropriate for education at this time  Skin:  Skin Assessment: Skin Integrity Issues: Stage II: right neck Unstageable: mid sacrum Incisions: buttocks  Last BM:  11/11 525 ml via rectal tube  Height:   Ht Readings from Last 1 Encounters:  04/05/20 '5\' 2"'  (1.575 m)    Weight:   Wt Readings from Last 1 Encounters:  04/13/20 102.8 kg    Ideal Body Weight:  50 kg  BMI:  Body mass index is 41.45 kg/m.  Estimated Nutritional Needs:   Kcal:  2100-2300  Protein:  120-140 grams  Fluid:  1 L + UOP   Lucas Mallow, RD, LDN, CNSC Please refer to Amion for contact information.

## 2020-04-13 NOTE — Progress Notes (Addendum)
10 Days Post-Op  Subjective: CC: Seen during dressing change with RN.   Objective: Vital signs in last 24 hours: Temp:  [97.3 F (36.3 C)-100.5 F (38.1 C)] 98.5 F (36.9 C) (11/12 1153) Pulse Rate:  [101-118] 115 (11/12 1153) Resp:  [17-28] 19 (11/12 1153) BP: (98-130)/(60-87) 116/76 (11/12 1153) SpO2:  [98 %-100 %] 98 % (11/12 1153) FiO2 (%):  [28 %] 28 % (11/12 0323) Weight:  [102.8 kg-106 kg] 102.8 kg (11/12 1117) Last BM Date: 04/12/20  Intake/Output from previous day: 11/11 0701 - 11/12 0700 In: -  Out: 525 [Stool:525] Intake/Output this shift: Total I/O In: -  Out: 3000 [Other:3000]  PE: Gen: Awake, NAD Pulm: Rate and effort normal  Wounds: Large sacral/left buttocks wound. Most of the wound is clean at base with granulation tissue that appears stable from picture on 10/8 by our team. There is approximately 10cm of undermining at the left most aspect of the wound with grey/tan discharge.   Lab Results:  Recent Labs    04/12/20 0520 04/13/20 0500  WBC 18.0* 17.5*  HGB 8.4* 7.9*  HCT 26.7* 24.8*  PLT 427* 434*   BMET Recent Labs    04/12/20 0520 04/13/20 0500  NA 140 138  K 4.1 4.8  CL 99 98  CO2 25 22  GLUCOSE 143* 140*  BUN 36* 60*  CREATININE 3.18* 4.55*  CALCIUM 9.2 9.4   PT/INR No results for input(s): LABPROT, INR in the last 72 hours. CMP     Component Value Date/Time   NA 138 04/13/2020 0500   K 4.8 04/13/2020 0500   CL 98 04/13/2020 0500   CO2 22 04/13/2020 0500   GLUCOSE 140 (H) 04/13/2020 0500   BUN 60 (H) 04/13/2020 0500   CREATININE 4.55 (H) 04/13/2020 0500   CALCIUM 9.4 04/13/2020 0500   PROT 5.9 (L) 04/13/2020 0500   ALBUMIN 1.5 (L) 04/13/2020 0500   AST 25 04/13/2020 0500   ALT 25 04/13/2020 0500   ALKPHOS 316 (H) 04/13/2020 0500   BILITOT 0.8 04/13/2020 0500   GFRNONAA 11 (L) 04/13/2020 0500   GFRAA 33 (L) 03/05/2020 1703   Lipase  No results found for: LIPASE     Studies/Results: No results  found.  Anti-infectives: Anti-infectives (From admission, onward)   Start     Dose/Rate Route Frequency Ordered Stop   04/12/20 1800  meropenem (MERREM) 500 mg in sodium chloride 0.9 % 100 mL IVPB        500 mg 200 mL/hr over 30 Minutes Intravenous Every 24 hours 04/12/20 1746     04/06/20 1200  vancomycin (VANCOCIN) IVPB 1000 mg/200 mL premix        1,000 mg 200 mL/hr over 60 Minutes Intravenous Every Fri (Hemodialysis) 04/05/20 1529 04/06/20 1300   04/04/20 1200  vancomycin (VANCOCIN) IVPB 1000 mg/200 mL premix        1,000 mg 200 mL/hr over 60 Minutes Intravenous Every M-W-F (Hemodialysis) 04/04/20 1014 04/04/20 1423   04/03/20 0000  piperacillin-tazobactam (ZOSYN) IVPB 2.25 g  Status:  Discontinued        2.25 g 100 mL/hr over 30 Minutes Intravenous Every 8 hours 04/02/20 2253 04/09/20 1559   04/02/20 2345  vancomycin (VANCOCIN) IVPB 1000 mg/200 mL premix        1,000 mg 200 mL/hr over 60 Minutes Intravenous  Once 04/02/20 2251 04/03/20 0236   04/02/20 2251  vancomycin variable dose per unstable renal function (pharmacist dosing)  Status:  Discontinued  Does not apply See admin instructions 04/02/20 2251 04/07/20 1037   04/02/20 1200  ceFAZolin (ANCEF) IVPB 1 g/50 mL premix  Status:  Discontinued        1 g 100 mL/hr over 30 Minutes Intravenous Every 24 hours 04/02/20 1030 04/02/20 2251   04/01/20 1300  vancomycin (VANCOCIN) 2,000 mg in sodium chloride 0.9 % 500 mL IVPB  Status:  Discontinued        2,000 mg 250 mL/hr over 120 Minutes Intravenous  Once 04/01/20 1203 04/01/20 1203   04/01/20 1300  vancomycin (VANCOREADY) IVPB 2000 mg/400 mL        2,000 mg 200 mL/hr over 120 Minutes Intravenous Every 12 hours 04/01/20 1205 04/01/20 1715   04/01/20 1300  vancomycin (VANCOCIN) 2,000 mg in sodium chloride 0.9 % 500 mL IVPB  Status:  Discontinued        2,000 mg 250 mL/hr over 120 Minutes Intravenous  Once 04/01/20 1207 04/01/20 1208   04/01/20 1245  vancomycin (VANCOCIN)  2,000 mg in sodium chloride 0.9 % 500 mL IVPB  Status:  Discontinued        2,000 mg 250 mL/hr over 120 Minutes Intravenous  Once 04/01/20 1154 04/01/20 1206   04/01/20 1154  vancomycin variable dose per unstable renal function (pharmacist dosing)  Status:  Discontinued         Does not apply See admin instructions 04/01/20 1154 04/02/20 1030   03/11/20 1200  cefTRIAXone (ROCEPHIN) 2 g in sodium chloride 0.9 % 100 mL IVPB        2 g 200 mL/hr over 30 Minutes Intravenous Every 24 hours 03/11/20 1107 03/17/20 1307   03/10/20 1200  piperacillin-tazobactam (ZOSYN) IVPB 3.375 g  Status:  Discontinued        3.375 g 12.5 mL/hr over 240 Minutes Intravenous Every 12 hours 03/10/20 1117 03/10/20 1245   03/10/20 1130  vancomycin (VANCOCIN) IVPB 1000 mg/200 mL premix        1,000 mg 200 mL/hr over 60 Minutes Intravenous  Once 03/10/20 1117 03/10/20 1258   03/10/20 1000  vancomycin variable dose per unstable renal function (pharmacist dosing)  Status:  Discontinued         Does not apply See admin instructions 03/09/20 1412 03/14/20 0810   03/09/20 2300  piperacillin-tazobactam (ZOSYN) IVPB 2.25 g  Status:  Discontinued        2.25 g 100 mL/hr over 30 Minutes Intravenous Every 8 hours 03/09/20 1412 03/10/20 1117   03/09/20 1415  piperacillin-tazobactam (ZOSYN) IVPB 3.375 g        3.375 g 100 mL/hr over 30 Minutes Intravenous  Once 03/09/20 1412 03/09/20 1537   03/09/20 1415  vancomycin (VANCOCIN) 2,250 mg in sodium chloride 0.9 % 500 mL IVPB        2,250 mg 250 mL/hr over 120 Minutes Intravenous  Once 03/09/20 1412 03/09/20 1639   02/28/20 2200  ceFEPIme (MAXIPIME) 1 g in sodium chloride 0.9 % 100 mL IVPB        1 g 200 mL/hr over 30 Minutes Intravenous Every 12 hours 02/28/20 1305 02/29/20 2220   02/28/20 0907  ceFEPIme (MAXIPIME) 1 g in sodium chloride 0.9 % 100 mL IVPB  Status:  Discontinued        1 g 200 mL/hr over 30 Minutes Intravenous Every 24 hours 02/27/20 0939 02/28/20 1305   02/26/20  2200  ceFEPIme (MAXIPIME) 2 g in sodium chloride 0.9 % 100 mL IVPB  Status:  Discontinued  2 g 200 mL/hr over 30 Minutes Intravenous Every 12 hours 02/26/20 1519 02/27/20 0939   02/25/20 1200  ceFEPIme (MAXIPIME) 2 g in sodium chloride 0.9 % 100 mL IVPB  Status:  Discontinued        2 g 200 mL/hr over 30 Minutes Intravenous Every T-Th-Sa (Hemodialysis) 02/25/20 0955 02/26/20 1519   02/24/20 1904  azithromycin (ZITHROMAX) 500 mg in sodium chloride 0.9 % 250 mL IVPB  Status:  Discontinued        500 mg 250 mL/hr over 60 Minutes Intravenous Every 24 hours 02/24/20 1904 02/26/20 0815   02/24/20 1900  azithromycin (ZITHROMAX) 250 mg in dextrose 5 % 125 mL IVPB  Status:  Discontinued        250 mg 125 mL/hr over 60 Minutes Intravenous Every 24 hours 02/24/20 1845 02/24/20 1904   02/24/20 1800  ceFEPIme (MAXIPIME) 2 g in sodium chloride 0.9 % 100 mL IVPB  Status:  Discontinued        2 g 200 mL/hr over 30 Minutes Intravenous Every Fri (Hemodialysis) 02/24/20 1326 02/25/20 0942   02/22/20 1745  ceFAZolin (ANCEF) IVPB 2g/100 mL premix        2 g 200 mL/hr over 30 Minutes Intravenous  Once 02/22/20 1744 02/22/20 1931   02/22/20 0600  ceFAZolin (ANCEF) IVPB 2g/100 mL premix        2 g 200 mL/hr over 30 Minutes Intravenous To Radiology 02/21/20 1658 02/22/20 1900   02/22/20 0000  ceFAZolin (ANCEF) IVPB 1 g/50 mL premix  Status:  Discontinued        1 g 100 mL/hr over 30 Minutes Intravenous To Radiology 02/21/20 1645 02/21/20 1658   02/21/20 1800  azithromycin (ZITHROMAX) tablet 250 mg  Status:  Discontinued       "Followed by" Linked Group Details   250 mg Oral Daily-1800 02/20/20 1658 02/24/20 1852   02/21/20 1800  ceFEPIme (MAXIPIME) 2 g in sodium chloride 0.9 % 100 mL IVPB  Status:  Discontinued        2 g 200 mL/hr over 30 Minutes Intravenous Every T-Th-Sa (1800) 02/20/20 1737 02/24/20 1326   02/20/20 1800  azithromycin (ZITHROMAX) tablet 500 mg       "Followed by" Linked Group Details    500 mg Oral Daily 02/20/20 1658 02/20/20 1731   02/20/20 1745  ceFEPIme (MAXIPIME) 1 g in sodium chloride 0.9 % 100 mL IVPB        1 g 200 mL/hr over 30 Minutes Intravenous STAT 02/20/20 1737 02/21/20 0747   02/19/20 1145  remdesivir 100 mg in sodium chloride 0.9 % 100 mL IVPB        100 mg 200 mL/hr over 30 Minutes Intravenous Daily 02/19/20 1131 02/19/20 1443   02/15/20 1000  remdesivir 100 mg in sodium chloride 0.9 % 100 mL IVPB  Status:  Discontinued       "Followed by" Linked Group Details   100 mg 200 mL/hr over 30 Minutes Intravenous Daily 02/14/20 0944 02/14/20 0951   02/15/20 1000  remdesivir 100 mg in sodium chloride 0.9 % 100 mL IVPB        100 mg 200 mL/hr over 30 Minutes Intravenous Daily 02/14/20 0952 02/17/20 1102   02/14/20 1030  remdesivir 100 mg in sodium chloride 0.9 % 100 mL IVPB        100 mg 200 mL/hr over 30 Minutes Intravenous Every 30 min 02/14/20 0952 02/14/20 1129   02/14/20 0945  remdesivir 200 mg in sodium  chloride 0.9% 250 mL IVPB  Status:  Discontinued       "Followed by" Linked Group Details   200 mg 580 mL/hr over 30 Minutes Intravenous Once 02/14/20 0944 02/14/20 0951       Assessment/Plan OSA Morbid obesity BMI 44 Hypothyroidism HTN GERD  ESRD on HD DM Asthma  Chronic respiratory failure 2/2 Covid-19 (diagnosed 02/09/20) S/p tracheostomy Code status DNR  Necrotizing soft tissue infection S/p incision and debridement of sacrum, left gluteus 11/2 Dr. Bobbye Morton - POD #10 - Culture: E COLI and ENTEROCOCCUS FAECALIS. Currently on Merrem  - Patient would likely benefit from futher debridement in the OR to remove skin overlying area of undermining; however she is very high risk given she was on the ventilator for so long.  I discussed the situation with her daughter, Coreena Rubalcava, and we decided to proceed with conservative management first with hydrotherapy to clean up some of the drainage in the tunneled area on the lateral aspect of the wound  tracking down her thigh.  We will start with hydrotherapy to clean the drainage up some.  If this does not improve, then we can reconsider debridement needs in the OR next week.  I will order hydrotherapy today likely to start on Saturday.  We will re-evaluate her wound on Monday.  This is not overtly dirty and from what is visible does not appear to likely be causing any significant source of sepsis.     LOS: 59 days    Jillyn Ledger , [facility] Surgery 04/13/2020, 1:53 PM Please see Amion for pager number during day hours 7:00am-4:30pm

## 2020-04-13 NOTE — Progress Notes (Signed)
OT Cancellation Note  Patient Details Name: Wanda Ramirez MRN: [id number] DOB: 09-Feb-1971   Cancelled Treatment:    Reason Eval/Treat Not Completed: Patient at procedure or test/ unavailable (HD)  Charlann Wayne,HILLARY 04/13/2020, 11:18 AM  Maurie Boettcher, OT/L   Acute OT Clinical Specialist [place] Pager [phone] Office [phone]

## 2020-04-14 DIAGNOSIS — U071 COVID-19: Secondary | ICD-10-CM | POA: Diagnosis not present

## 2020-04-14 LAB — GLUCOSE, CAPILLARY
Glucose-Capillary: 91 mg/dL (ref 70–99)
Glucose-Capillary: 136 mg/dL — ABNORMAL HIGH (ref 70–99)
Glucose-Capillary: 129 mg/dL — ABNORMAL HIGH (ref 70–99)
Glucose-Capillary: 93 mg/dL (ref 70–99)
Glucose-Capillary: 73 mg/dL (ref 70–99)
Glucose-Capillary: 169 mg/dL — ABNORMAL HIGH (ref 70–99)

## 2020-04-14 LAB — CBC
HCT: 26.9 % — ABNORMAL LOW (ref 36.0–46.0)
Hemoglobin: 8.6 g/dL — ABNORMAL LOW (ref 12.0–15.0)
MCH: 29.1 pg (ref 26.0–34.0)
MCHC: 32 g/dL (ref 30.0–36.0)
MCV: 90.9 fL (ref 80.0–100.0)
Platelets: 430 10*3/uL — ABNORMAL HIGH (ref 150–400)
RBC: 2.96 MIL/uL — ABNORMAL LOW (ref 3.87–5.11)
RDW: 15.2 % (ref 11.5–15.5)
WBC: 15.6 10*3/uL — ABNORMAL HIGH (ref 4.0–10.5)
nRBC: 0 % (ref 0.0–0.2)

## 2020-04-14 LAB — RENAL FUNCTION PANEL
Albumin: 1.7 g/dL — ABNORMAL LOW (ref 3.5–5.0)
Anion gap: 14 (ref 5–15)
BUN: 45 mg/dL — ABNORMAL HIGH (ref 6–20)
CO2: 23 mmol/L (ref 22–32)
Calcium: 9.4 mg/dL (ref 8.9–10.3)
Chloride: 98 mmol/L (ref 98–111)
Creatinine, Ser: 3.42 mg/dL — ABNORMAL HIGH (ref 0.44–1.00)
GFR, Estimated: 16 mL/min — ABNORMAL LOW (ref >60)
Glucose, Bld: 158 mg/dL — ABNORMAL HIGH (ref 70–99)
Phosphorus: 7.3 mg/dL — ABNORMAL HIGH (ref 2.5–4.6)
Potassium: 4 mmol/L (ref 3.5–5.1)
Sodium: 135 mmol/L (ref 135–145)

## 2020-04-14 MED ORDER — ALTEPLASE 2 MG IJ SOLR
2.0000 mg | Freq: Once | INTRAMUSCULAR | Status: AC
  Administered 2020-04-14: 2 mg
  Filled 2020-04-14: qty 2

## 2020-04-14 MED ORDER — ALTEPLASE 2 MG IJ SOLR
2.0000 mg | Freq: Once | INTRAMUSCULAR | Status: AC
  Administered 2020-04-14: 2 mg
  Filled 2020-04-14 (×2): qty 2

## 2020-04-14 MED ORDER — INSULIN DETEMIR 100 UNIT/ML ~~LOC~~ SOLN
30.0000 [IU] | Freq: Two times a day (BID) | SUBCUTANEOUS | Status: DC
  Administered 2020-04-14 – 2020-04-20 (×12): 30 [IU] via SUBCUTANEOUS
  Filled 2020-04-14 (×16): qty 0.3

## 2020-04-14 NOTE — Progress Notes (Signed)
Physical Therapy Wound Evaluation/Treatment Patient Details  Name: Wanda Ramirez MRN: [id number] Date of Birth: 1970-08-19  Today's Date: 04/14/2020 Time: 4098-1191 Time Calculation (min): 57 min  Subjective  Subjective: Nodding head intermittently to questions but not very engaged with the session, and avoiding eye contact with therapist when explaining hydro procedure.  Patient and Family Stated Goals: None stated at this time.  Date of Onset:  (Unknown) Prior Treatments: Dressing changes  Pain Score:  Pt was premedicated. She appeared to be in pain, and was offered IV pain meds in the middle of session however pt refused.   Wound Assessment  Pressure Injury 03/06/20 Sacrum Mid Unstageable - Full thickness tissue loss in which the base of the injury is covered by slough (yellow, tan, gray, green or brown) and/or eschar (tan, brown or black) in the wound bed. (Active)  Wound Image   04/14/20 1458  Dressing Type ABD;Barrier Film (skin prep);Gauze (Comment);Moist to dry 04/14/20 1458  Dressing Changed;Clean;Dry;Intact 04/14/20 1458  Dressing Change Frequency Daily 04/14/20 1458  State of Healing Early/partial granulation 04/14/20 1458  Site / Wound Assessment Red;Yellow;Black 04/14/20 1458  % Wound base Red or Granulating 60% 04/14/20 1458  % Wound base Yellow/Fibrinous Exudate 15% 04/14/20 1458  % Wound base Black/Eschar 15% 04/14/20 1458  % Wound base Other/Granulation Tissue (Comment) 10% 04/14/20 1458  Peri-wound Assessment Intact 04/14/20 1458  Wound Length (cm) 16.5 cm 04/14/20 1458  Wound Width (cm) 20.3 cm 04/14/20 1458  Wound Depth (cm) 6 cm 04/14/20 1458  Wound Surface Area (cm^2) 334.95 cm^2 04/14/20 1458  Wound Volume (cm^3) 2009.7 cm^3 04/14/20 1458  Tunneling (cm) 11.8 cm at 8:00 04/14/20 1458  Undermining (cm) 6.7 cm from 6:00-8:00 04/14/20 1458  Margins Unattached edges (unapproximated) 04/14/20 1458  Drainage Amount Copious 04/14/20 1458  Drainage Description  Purulent 04/14/20 1458  Treatment Debridement (Selective);Hydrotherapy (Pulse lavage);Packing (Saline gauze) 04/14/20 1458      Hydrotherapy Pulsed lavage therapy - wound location: sacrum Pulsed Lavage with Suction (psi): 8 psi Pulsed Lavage with Suction - Normal Saline Used: 1000 mL Pulsed Lavage Tip: Tip with splash shield Selective Debridement Selective Debridement - Location: sacrum Selective Debridement - Tools Used: Forceps;Scissors Selective Debridement - Tissue Removed: yellow and black necrotic tissue, necrotic adipose   Wound Assessment and Plan  Wound Therapy - Assess/Plan/Recommendations Wound Therapy - Clinical Statement: Pt presents to hydrotherapy with a large sacral wound and 11.8 cm tunnel towards thigh which is copiously draining purulence. Some necrotic tissue was able to be removed today, however the tissue is largely red and healthy appearing over most of the wound bed as well as some connective tissue present at base of wound. This patient will benefit from continued hydrotherapy for selective removal of unviable tissue, to decrease bioburden, and promote wound bed healing.  Wound Therapy - Functional Problem List: Global weakness in the setting of COVID and extended hospital stay. Factors Delaying/Impairing Wound Healing: Incontinence;Infection - systemic/local;Immobility;Multiple medical problems;Polypharmacy;Diabetes Mellitus Hydrotherapy Plan: Debridement;Dressing change;Patient/family education;Pulsatile lavage with suction Wound Therapy - Frequency: 6X / week Wound Therapy - Follow Up Recommendations: Skilled nursing facility Wound Plan: See above  Wound Therapy Goals- Improve the function of patient's integumentary system by progressing the wound(s) through the phases of wound healing (inflammation - proliferation - remodeling) by: Decrease Necrotic Tissue to: 0 Decrease Necrotic Tissue - Progress: Goal set today Increase Granulation Tissue to: 100% Increase  Granulation Tissue - Progress: Goal set today Improve Drainage Characteristics: Min;Serous Improve Drainage Characteristics - Progress: Goal set  today Goals/treatment plan/discharge plan were made with and agreed upon by patient/family: Yes Time For Goal Achievement: 7 days Wound Therapy - Potential for Goals: Excellent  Goals will be updated until maximal potential achieved or discharge criteria met.  Discharge criteria: when goals achieved, discharge from hospital, MD decision/surgical intervention, no progress towards goals, refusal/missing three consecutive treatments without notification or medical reason.  GP     [name] 04/14/2020, 3:17 PM  Rolinda Roan, PT, DPT Acute Rehabilitation Services Pager: [phone] Office: [phone]

## 2020-04-14 NOTE — Evaluation (Signed)
Occupational Therapy Evaluation Patient Details Name: Wanda Ramirez MRN: [id number] DOB: 07-23-1970 Today's Date: 04/14/2020    History of Present Illness 49 yo admitted 9/13 with Covid PNA, intubated 9/24-10/6, reintubated 10/8, CRRT 9/26-10/7, trach and bronch 10/18. Pt with significant sacral wound. PMhx: ESRD TTS, obesity, HTN, GERD, hypothyroidism, DM, asthma   Clinical Impression   This 49 y/o female presents with the above. Pt with extensive hospital stay, seen today for OT re-evaluation. Pt currently presenting with the above and below listed deficits including notable weakness (globally), impaired cognition and overall mobility status. She requires overall totalA for ADL at this time and totalA for repositioning in bed, suspect will require two person assist for safe completion of bed mobility or EOB attempts given weakness and given pt with sacral wound at this time. Pt also noted with painful extremities with ROM attempts (LE>UE) and with decreased command follow, processing noted. She will benefit from continued acute OT services and currently recommend post acute rehab services (LTACH vs SNF) to maximize her overall strength, safety and independence with ADL and mobility.     Follow Up Recommendations  LTACH;SNF    Equipment Recommendations  Other (comment);Hospital bed (TBD)           Precautions / Restrictions Precautions Precautions: Fall;Other (comment) Precaution Comments: trach, PEG, flexiseal Required Braces or Orthoses: Other Brace Other Brace: bil prevalons (has a pressure blood blister on the back of her R heel.  Restrictions Weight Bearing Restrictions: No      Mobility Bed Mobility               General bed mobility comments: Will need two person assist to mobilize or for rolling at bed level given pain with general ROM/repositioning     Transfers                 General transfer comment: NT at this time         ADL either performed  or assessed with clinical judgement   ADL Overall ADL's : Needs assistance/impaired Eating/Feeding: NPO   Grooming: Maximal assistance;Bed level                                 General ADL Comments: pt currently totalA for all other aspect of ADL at this time      Vision         Perception     Praxis      Pertinent Vitals/Pain Pain Assessment: Faces Faces Pain Scale: Hurts whole lot Pain Location: generalized, most notable with attempts to move LEs/reposition prevalons  Pain Descriptors / Indicators: Grimacing;Discomfort Pain Intervention(s): Monitored during session;Repositioned     Hand Dominance Right   Extremity/Trunk Assessment Upper Extremity Assessment Upper Extremity Assessment: RUE deficits/detail;LUE deficits/detail;Generalized weakness RUE Deficits / Details: grossly 2/5 throughout; initially resistive to some movements but able to progress through full PROM  RUE Coordination: decreased fine motor;decreased gross motor LUE Deficits / Details: grossly 2/5 throughout; initially resistive to some movements but able to progress through full PROM  LUE Coordination: decreased fine motor;decreased gross motor   Lower Extremity Assessment Lower Extremity Assessment: Defer to PT evaluation   Cervical / Trunk Assessment Cervical / Trunk Assessment: Kyphotic;Other exceptions Cervical / Trunk Exceptions: very rounded from extended time in bed, scapula are protracted, head is forward and back is rounded.    Communication Communication Communication: Tracheostomy   Cognition Arousal/Alertness: Awake/alert Behavior During Therapy:  Flat affect Overall Cognitive Status: Difficult to assess Area of Impairment: Attention;Memory;Following commands;Safety/judgement;Awareness;Problem solving                   Current Attention Level: Sustained Memory: Decreased short-term memory Following Commands: Follows one step commands inconsistently;Follows one  step commands with increased time Safety/Judgement: Decreased awareness of safety;Decreased awareness of deficits Awareness: Intellectual Problem Solving: Slow processing;Decreased initiation;Difficulty sequencing;Requires verbal cues;Requires tactile cues General Comments: multimodal cueing and increased time for command follow, attempting to mouth words during session   General Comments       Exercises Exercises: Other exercises Other Exercises Other Exercises: AA/PROM to bil UEs within available limits Other Exercises: repositioning of bil LEs in prevalons, pt with increased pain with attempts to reposition    Shoulder Instructions      Home Living Family/patient expects to be discharged to:: Private residence Living Arrangements: Children Available Help at Discharge: Family;Available PRN/intermittently Type of Home: Apartment Home Access: Level entry     Home Layout: Two level;Bed/bath upstairs;1/2 bath on main level     Bathroom Shower/Tub: Teacher, early years/pre: Standard     Home Equipment: None   Additional Comments: information gathered in chart      Prior Functioning/Environment Level of Independence: Independent        Comments: pt normally cares for herself and drives to/from HD. Pt lives with son who works and has grandchildren        OT Problem List: Decreased strength;Decreased range of motion;Decreased activity tolerance;Impaired balance (sitting and/or standing);Decreased cognition;Decreased coordination;Decreased safety awareness;Decreased knowledge of use of DME or AE;Impaired UE functional use;Pain;Obesity;Cardiopulmonary status limiting activity      OT Treatment/Interventions: Self-care/ADL training;Therapeutic exercise;Neuromuscular education;Energy conservation;DME and/or AE instruction;Therapeutic activities;Cognitive remediation/compensation;Patient/family education;Balance training    OT Goals(Current goals can be found in the  care plan section) Acute Rehab OT Goals Patient Stated Goal: unable to state OT Goal Formulation: Patient unable to participate in goal setting Time For Goal Achievement: 04/28/20 Potential to Achieve Goals: Fair  OT Frequency: Min 2X/week   Barriers to D/C:            Co-evaluation              AM-PAC OT "6 Clicks" Daily Activity     Outcome Measure Help from another person eating meals?: Total Help from another person taking care of personal grooming?: A Lot Help from another person toileting, which includes using toliet, bedpan, or urinal?: Total Help from another person bathing (including washing, rinsing, drying)?: Total Help from another person to put on and taking off regular upper body clothing?: Total Help from another person to put on and taking off regular lower body clothing?: Total 6 Click Score: 7   End of Session Nurse Communication: Mobility status  Activity Tolerance: Patient limited by pain;Patient tolerated treatment well Patient left: in bed;with call bell/phone within reach;with family/visitor present  OT Visit Diagnosis: Other abnormalities of gait and mobility (R26.89);Muscle weakness (generalized) (M62.81);Other symptoms and signs involving cognitive function                Time: 0539-7673 OT Time Calculation (min): 18 min Charges:  OT General Charges $OT Visit: 1 Visit OT Evaluation $OT Re-eval: 1 Re-eval  Lou Cal, OT Acute Rehabilitation Services Pager [phone] Office [phone]   Raymondo Band 04/14/2020, 1:32 PM

## 2020-04-14 NOTE — Progress Notes (Signed)
Brown and blue port of central line TPA'd. Blue port successful, however brown port flushes, but no blood return. Dr. Darrick Meigs paged and ordered to TPA brown port again.

## 2020-04-14 NOTE — Progress Notes (Signed)
[facility] Progress Note   OP HD:TTS 4h 36mn 450/800 2/2.25 bath 111.5kg Hep 2000 L AVF - darbe 50 ug q week, last 9/7 - calc 1.0 tiw - 9/9 Hb 10.0, tsat 22%  49yo female w/ ESRD on TTS HD admitted for COVID PNA on 02/13/20. She was intubated. SP CRRT around 9/26- 10/7. Now is on intermittent HD. She is sp trach. She had MSSA pna. DM2.  Assessment/ Plan:   1. COVID pna /sp trach - now on TC and in SDU; plans being made for G tube once leukocytosis improves, perhaps next week. 2. Sacral decub w/ dermal necrosis - sp I&D 11/2 and again 11/5 by gen surg. Surgery following again and planning hydrotherapy 1st and may need another debridement.  Currently on merrem. 3. S/P MSSA HCAP/ VAP 4. ESRD -usual HD TTS.sp CRRT 9/26- 10/7. HD 11/12, plan next will be 11/15. 4. BP/ volume - on midodrine 10 tid. Some dependent hip edema bilat. Wt's improving. UF w/ HD as tolerated 5. HD access: LUA AVF clotted while here. TDC x2 here by IR.LIJ TC 6. Anemia ckd/ critical illness - on darbe 150 weekly, 11/12 Ferritin >4000, iron sat 11.  With active infection and ^^ ferritin no IV iron. tx prbc's PRN, not currently indicated.  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 Phos 3.9 10/30 > 9.2 11/8 --> 6.5.  RD switched to nepro and lowered FWF on 11/12. Follow 8. DM2 - per pmd   LJannifer HickMD [facility] Pager [phone]  Subjective:   Tolerated 3L UF with HD yesterday Plans for trial of hydrotherapy prior to another debridement of sacral wound per surgery consult  Shakes head no for any issues today.    Objective:   BP 117/61 (BP Location: Right Arm)    Pulse (!) 105    Temp 99.1 F (37.3 C) (Oral)    Resp 18    Ht _0  (1.575 m)    Wt 102.8 kg    SpO2 97%    BMI 41.45 kg/m   Intake/Output Summary (Last 24 hours) at 04/14/2020 0855 Last data filed at 04/14/2020 0321 Gross per 24 hour  Intake 3983.63 ml  Output 3000 ml  Net 983.63  ml   Weight change:   Physical Exam: Awake, will localize to me and attend to conversation , +trach collar  Lungs - very coarse, loose rhonchi but less than prior days, no rales, no coughing during exam CVS-tachy ABD- BS present soft non-distended  EXT- trace pedal edema, lt arm access thrombosed ACCESS: [facility] TC  Imaging: No results found.  Labs: BMET Recent Labs  Lab 04/08/20 1015 04/09/20 0917 04/10/20 0518 04/11/20 1130 04/12/20 0520 04/13/20 0500 04/14/20 0719  NA 139 140 141 143 140 138 135  K 4.4 4.7 4.2 5.4* 4.1 4.8 4.0  CL 101 101 102 105 99 98 98  CO2 23 20* _1 GLUCOSE 291* 249* 257* 244* 143* 140* 158*  BUN 56* 75* 42* 69* 36* 60* 45*  CREATININE 4.33* 5.30* 3.45* 5.12* 3.18* 4.55* 3.42*  CALCIUM 8.8* 9.0 9.2 9.3 9.2 9.4 9.4  PHOS  --  9.2*  --   --  6.5*  --  7.3*   CBC Recent Labs  Lab 04/11/20 1130 04/12/20 0520 04/13/20 0500 04/14/20 0719  WBC 18.6* 18.0* 17.5* 15.6*  NEUTROABS 15.1* 14.6*  --   --   HGB 8.2* 8.4* 7.9* 8.6*  HCT 26.1* 26.7* 24.8* 26.9*  MCV 90.9 91.4 91.2 90.9  PLT 405* 427* 434* 430*    Medications:     sodium chloride   Intravenous Once   sodium chloride   Intravenous Once   B-complex with vitamin C  1 tablet Per Tube Daily   chlorhexidine gluconate (MEDLINE KIT)  15 mL Mouth Rinse BID   Chlorhexidine Gluconate Cloth  6 each Topical Q0600   clonazepam  0.5 mg Per Tube TID   darbepoetin (ARANESP) injection - DIALYSIS  150 mcg Intravenous Q Wed-HD   feeding supplement (PROSource TF)  45 mL Per Tube TID   free water  20 mL Per Tube Q6H   heparin injection (subcutaneous)  5,000 Units Subcutaneous Q8H   hydrocerin   Topical Daily   insulin aspart  0-20 Units Subcutaneous Q4H   insulin aspart  7 Units Subcutaneous Q4H   insulin detemir  32 Units Subcutaneous BID   levothyroxine  125 mcg Per Tube Q0600   mouth rinse  15 mL Mouth Rinse 10 times per day   midodrine  10 mg Per Tube TID WC    nutrition supplement (JUVEN)  1 packet Per Tube BID   oxyCODONE  10 mg Per Tube Q6H   pantoprazole sodium  40 mg Per Tube BID   polyethylene glycol  17 g Per Tube Daily   QUEtiapine  100 mg Per Tube BID   sodium chloride flush  10-40 mL Intracatheter Q12H

## 2020-04-14 NOTE — Progress Notes (Signed)
NAME:  Wanda Ramirez, MRN:  [id number], DOB:  [birth year], LOS: 32 ADMISSION DATE:  02/13/2020   Brief History  20 yof with ESRD on HD admitted on 02/13/20 for COVID 19 pneumonia which progressed to respiratory failure requiring intubation on 10/8. Unfortunately, she was unable to be weaned from the ventilator and tracheostomy was subsequently placed on 10/18. Her hospitalization has been complicated by septic shock requiring pressor support. Additionally complicated by a sacral decubitus ulcer infection for which she underwent a debridement for on 11/2.  Interm history/ Subjective   Evaluated by surgery yesterday who, after discussion with patient's daughter, has decided to try hydrotherapy rather than going straight to another debridement.  Surgery notes indicate that they do not think this is the source of infection attributable to her fevers.  PM on call team received a phone call from patient's other sister who has concerns regarding patient's care. I spoke with [facility] via telephone this morning. She expressed concern and confusion regarding the discussion that I had vs the surgeons had with her. She was wondering why I had told her that the white blood cell count is still elevated and the surgeons had said it was coming down. I explained that both of the above statements were true and that, although the WBC count is still coming down, it is still elevated above the upper limit of normal. She also relayed frustration regarding communication of patient's care plan. She notes that she had previously been told that Faelynn would have another debridement a couple of Friday's ago while she was still intubated however it never happened. She relayed that when she spoke with the surgeons on the phone yesterday, they had implied apprehension about taking her for another surgery as she may end up back on the ventilator and in the ICU. She questions why they could not have done the surgery when she was on  ventilator in the weeks prior.  I told her I can not speak on the surgeons behalf of what exactly was said vs what was understood. I explained what the current plan is in terms of medical and surgical treatment. Her daughter expressed appreciation for all of this. I also discussed that we will stop antibiotics and see what happens with her white count and fever curve and the risk for antibiotics given her already present MDR organisms.  I additionally spoke with Leonard Downing, NP from palliative care regarding this patient's case. She brought up an excellent point regarding pt's inability to even have outpatient dialysis as she is completely bed bound at this time. She would likely need to go to a specialized facility that could provide dialysis in her clinical context.   Significant Hospital Events   9/14 Admission to general floor 9/23 Tunneled HD cath placed 9/26 Transferred to ICU for hypotension and CRRT 10/1 On CRRT and vasopressors 10/6 Extubated 10/16 Moved off COVID floor 10/18 tracheostomy performed 10/31 Restart vanc for staph in resp culture 11/2 Surgical debridement for worsening sacral wound 11/7 Off pressors 11/8 Off antibiotics, surgery sign off  11/10 IMTS to assume care 11/11 febrile. merrem started. Surgery reconsulted 11/12 no acute events  Objective   Blood pressure 117/61, pulse (!) 109, temperature 99.1 F (37.3 C), temperature source Oral, resp. rate 18, height 5\' 2"  (1.575 m), weight 102.8 kg, SpO2 98 %.     Intake/Output Summary (Last 24 hours) at 04/14/2020 0536 Last data filed at 04/14/2020 0321 Gross per 24 hour  Intake 3983.63 ml  Output 3000  ml  Net 983.63 ml   Filed Weights   04/11/20 1529 04/13/20 0725 04/13/20 1117  Weight: 107 kg 106 kg 102.8 kg    Examination: General: no acute distress, drowsy this morning. Cardiac: tachycardic rate, regular rhythm Pulm: on 5L via trach mask. Upper respiratory sounds appreciable on auscultation. No crackles or  rhonchi heart anteriorly. Posterior auscultation not possible due to positioning at time of exam.  Consults:  Nephrology General surgery Palliative care  Significant Diagnostic Tests:  9/13 CXR>> bilateral patchy airspace opacities 9/21 VQ scan>> no PE 9/29 echocardiogram>> normal LVEF, findings consistent with increased RV volume and fluid overload, moderately reduced RV function 10/28 CT abdomen/pelvis >>scattered ground glass and consolidative opacities in the lung bases. Cystic space in the anterior, subpleural RML with septations favoring complicated pneumatocele. Non-obstructive punctuate nephrolithiasis 11/1 CT abdomen/pelvis>> persistent multifocal bibasilar airspace disease with scarring. Bullous changes seen in RML. Decubitus ulcer overlying the sacrococcygeal junction with extensive gas seen in the left gluteal region involving the muscle and fat. No evidence of abscess. Findings consistent with cellulitis and myositis. No evidence of body destruction to suggest osteomyelitis.  Micro Data:  9/13 blood culture>>no growth 9/26 blood culture>> no growth 10/8 resp culture>>MSSA, resistant to clinda, erythromycin, clindamycin 10/8 blood culture>> no growth 10/18 resp culture>> normal resp flora 10/21 resp culture>> normal resp flora 10/24 blood culture>> no growth 10/25 resp culture>> few cornebacterium 10/29 resp culture>> MSSA, resistant to clinda, erythromycin, clinda 11/2 blood culture>> no growth 11/2 resp culture>> no growth 11/2 wound culture>> e.coli resistant to ampicillin, cephalosporins, bactrim, e. Faecalis pan sensitive 11/9 blood culture>> no growth 11/11 blood culture>> NGTD Antimicrobials:  Remdesivir 9/14>>9/19 Cefepime 9/20>>9/29 Azithromycin 9/20>>9/25 Zosyn 10/8, 11/1>11/8 Vanc 10/8, 10/31>>11/5 Rocephin>>10/10>>10/16 Merrem 11/11>>11/13  Procedures/lines  Tracheostomy shiley 6.0 XLT 10/18 Sacral wound debridement--11/2  Tunneled HD line  9/27>> Central line 11/2>> Rectal tube 10/2>> Cortrak 10/4>>  Central line 9/26-10/24 A-line 9/30>>10/13, 11/2-11/9 HD line 9/22-9/27 ETT 9/26-10/6 ETT 10/8-10/18  Labs    CBC Latest Ref Rng & Units 04/13/2020 04/12/2020 04/11/2020  WBC 4.0 - 10.5 K/uL 17.5(H) 18.0(H) 18.6(H)  Hemoglobin 12.0 - 15.0 g/dL 7.9(L) 8.4(L) 8.2(L)  Hematocrit 36 - 46 % 24.8(L) 26.7(L) 26.1(L)  Platelets 150 - 400 K/uL 434(H) 427(H) 405(H)   BMP Latest Ref Rng & Units 04/13/2020 04/12/2020 04/11/2020  Glucose 70 - 99 mg/dL 140(H) 143(H) 244(H)  BUN 6 - 20 mg/dL 60(H) 36(H) 69(H)  Creatinine 0.44 - 1.00 mg/dL 4.55(H) 3.18(H) 5.12(H)  Sodium 135 - 145 mmol/L 138 140 143  Potassium 3.5 - 5.1 mmol/L 4.8 4.1 5.4(H)  Chloride 98 - 111 mmol/L 98 99 105  CO2 22 - 32 mmol/L 22 25 23   Calcium 8.9 - 10.3 mg/dL 9.4 9.2 9.3    Summary  58 yof admitted 02/17/20 for COVID-19 pneumonia who developed chronic respiratory failure requiring a tracheostomy. Her hospitalization has been complicated by recurrent respiratory infections as well as the development of a sacral decubitus ulcer. She was transferred to the IMTS 11/10 for ongoing care while awaiting the placement of a PEG tube.  Assessment & Plan:  Principal Problem:   COVID-19 Active Problems:   End stage renal disease on dialysis ([place])   Diabetes mellitus type 2 in obese (HCC)   OSA (obstructive sleep apnea)   HCAP (healthcare-associated pneumonia)   Acute respiratory failure with hypoxemia (HCC)   Encephalopathy acute   ARDS (adult respiratory distress syndrome) (HCC)   Pressure injury of skin   Aspiration into airway  Status post tracheostomy ([place])   Fever   Septic shock (HCC)   Cellulitis of buttock  Anxiety.  --continue seroquel, klonopin --delirium precautions. Shades open, lights on, up to chair if able during the day, lights off, TV off at night.  ESRD on HD THS. AVF clotted off resulting in need for use of tunneled HD line. Management per  nephrology.   Anemia of CKD/critical illness Stable. On aranesp, midodrine. Will continue to monitor with daily labs.  Chronic respiratory failure s/p tracheostomy secondary to COVID 19 pneumonia 6.0 shiley XLT cuffed. Worked with SLP who recommends downsizing trach.  Remains on 5L supplemental O2 Plan --trach care --continue to work toward cuffless. Appreciate SLP and RT involvement  SIRS (11/11).  numerous potential sources for an infection--HD line, central line, trach, sacral wound. Her white count is down to 15 today but I do question if she will not have a persistent leukocytosis and intermittent low grade temps without infection due to her critically ill state.  Afebrile overnight--Temps around 68F. Tachycardic around 120 NGTD on 11/11 blood cultures. Afebrile now. Tachycardia improving. Sacral decubitus ulcer s/p debridement by gen surgery (11/2, 11/5). Unfortunately, unlikely to heal until more mobile and is high risk for infection General surgery not convinced that sacral ulcer is source of infection Multi-drug resistant bacteria as noted on prior wound culture.  Plan --d/c merrem today and monitor for s/s of infection --general surgery recommending trying hydrotherapy prior to having to go back to the OR for another debridement --continue frequent repositioning --wound care --Pain management: oxycodone 10mg  q6h, prn fentanyl 149mcg with dressing changes  Type 2 Diabetes. Glucoses ranging 81-136. Will back off on levemir to 30U BID to try to avoid a hypoglycemic event. Note that feeds were changed 11/12. Glucose range goal: 120-180. Continue novolog 7U q4h  High risk malnutrition. Will need PEG for ongoing nutrition. Unfortunately, this continues to get delayed due to her persistently elevated white count and intermittent low grade fevers. --continue tube feeds via cortrak at this time. RD recommending switching to Nepro and to d/c nutrisource fiber. Greatly appreciate their  recommendations. --will re-consult IR if suspicion for infection remains low over the upcoming days  Cholestasis of critical illness. Confirmed with elevated GGT.  Physical deconditioning. PT/OT consulted.  Goals of care. Case discussed with Leonard Downing, palliative NP. Greatly appreciate her involvement and assistance from a goals of care and pain management perspective. Unfortunately, Aalaysia's long term prognosis and quality of life seems to be dismal and I think it will be helpful to set realistic goals with family  Best practice:  CODE STATUS: DNR Diet: tube feeds DVT for prophylaxis: heparin Social considerations/Family communication: daughter updated Dispo: pending ongoing workup   Mitzi Hansen, MD Internal Medicine Resident PGY-2 [facility] Pager: [phone] 04/14/2020 5:36 AM    Contact info:  Please page me at [phone] from 7am-5pm M-F.  Please use the IMTS after hours pager at [phone] after 5pm M-F and on weekends

## 2020-04-14 NOTE — Plan of Care (Signed)
°  Problem: Coping: °Goal: Level of anxiety will decrease °Outcome: Progressing °  °

## 2020-04-15 LAB — PREPARE RBC (CROSSMATCH)

## 2020-04-15 LAB — CBC
HCT: 25.1 % — ABNORMAL LOW (ref 36.0–46.0)
HCT: 22.8 % — ABNORMAL LOW (ref 36.0–46.0)
Hemoglobin: 8.1 g/dL — ABNORMAL LOW (ref 12.0–15.0)
Hemoglobin: 7.3 g/dL — ABNORMAL LOW (ref 12.0–15.0)
MCH: 28.9 pg (ref 26.0–34.0)
MCH: 29.3 pg (ref 26.0–34.0)
MCHC: 32.3 g/dL (ref 30.0–36.0)
MCHC: 32 g/dL (ref 30.0–36.0)
MCV: 89.6 fL (ref 80.0–100.0)
MCV: 91.6 fL (ref 80.0–100.0)
Platelets: 398 10*3/uL (ref 150–400)
Platelets: 423 10*3/uL — ABNORMAL HIGH (ref 150–400)
RBC: 2.8 MIL/uL — ABNORMAL LOW (ref 3.87–5.11)
RBC: 2.49 MIL/uL — ABNORMAL LOW (ref 3.87–5.11)
RDW: 14.7 % (ref 11.5–15.5)
RDW: 14.5 % (ref 11.5–15.5)
WBC: 13.8 10*3/uL — ABNORMAL HIGH (ref 4.0–10.5)
WBC: 15.6 10*3/uL — ABNORMAL HIGH (ref 4.0–10.5)
nRBC: 0 % (ref 0.0–0.2)
nRBC: 0 % (ref 0.0–0.2)

## 2020-04-15 LAB — RENAL FUNCTION PANEL
Albumin: 1.6 g/dL — ABNORMAL LOW (ref 3.5–5.0)
Anion gap: 15 (ref 5–15)
BUN: 80 mg/dL — ABNORMAL HIGH (ref 6–20)
CO2: 21 mmol/L — ABNORMAL LOW (ref 22–32)
Calcium: 9.1 mg/dL (ref 8.9–10.3)
Chloride: 96 mmol/L — ABNORMAL LOW (ref 98–111)
Creatinine, Ser: 4.49 mg/dL — ABNORMAL HIGH (ref 0.44–1.00)
GFR, Estimated: 11 mL/min — ABNORMAL LOW (ref >60)
Glucose, Bld: 148 mg/dL — ABNORMAL HIGH (ref 70–99)
Phosphorus: 8.9 mg/dL — ABNORMAL HIGH (ref 2.5–4.6)
Potassium: 4.1 mmol/L (ref 3.5–5.1)
Sodium: 132 mmol/L — ABNORMAL LOW (ref 135–145)

## 2020-04-15 LAB — CULTURE, BLOOD (ROUTINE X 2)
Culture: NO GROWTH
Culture: NO GROWTH
Special Requests: ADEQUATE
Special Requests: ADEQUATE

## 2020-04-15 LAB — GLUCOSE, CAPILLARY
Glucose-Capillary: 129 mg/dL — ABNORMAL HIGH (ref 70–99)
Glucose-Capillary: 95 mg/dL (ref 70–99)
Glucose-Capillary: 68 mg/dL — ABNORMAL LOW (ref 70–99)
Glucose-Capillary: 87 mg/dL (ref 70–99)
Glucose-Capillary: 111 mg/dL — ABNORMAL HIGH (ref 70–99)

## 2020-04-15 MED ORDER — SODIUM CHLORIDE 0.9 % IV BOLUS
250.0000 mL | Freq: Once | INTRAVENOUS | Status: AC | PRN
  Administered 2020-04-15: 250 mL via INTRAVENOUS

## 2020-04-15 MED ORDER — CHLORHEXIDINE GLUCONATE CLOTH 2 % EX PADS
6.0000 | MEDICATED_PAD | Freq: Every day | CUTANEOUS | Status: DC
  Administered 2020-04-15 – 2020-04-19 (×3): 6 via TOPICAL

## 2020-04-15 MED ORDER — "THROMBI-PAD 3""X3"" EX PADS"
1.0000 | MEDICATED_PAD | Freq: Once | CUTANEOUS | Status: DC
  Filled 2020-04-15: qty 1

## 2020-04-15 MED ORDER — SODIUM CHLORIDE 0.9% IV SOLUTION: Freq: Once | INTRAVENOUS | Status: DC

## 2020-04-15 NOTE — Progress Notes (Signed)
[facility] Progress Note   OP HD:TTS 4h 32mn 450/800 2/2.25 bath 111.5kg Hep 2000 L AVF - darbe 50 ug q week, last 9/7 - calc 1.0 tiw - 9/9 Hb 10.0, tsat 22%  49yo female w/ ESRD on TTS HD admitted for COVID PNA on 02/13/20. She was intubated. SP CRRT around 9/26- 10/7. Now is on intermittent HD. She is sp trach. She had MSSA pna. DM2.  Assessment/ Plan:   1. COVID pna /sp trach - now on TC and in SDU; plans being made for G tube once leukocytosis improves, perhaps next week. 2. Sacral decub w/ dermal necrosis - sp I&D 11/2 and again 11/5 by gen surg. Surgery following again and planning hydrotherapy 1st and may need another debridement.  Currently on merrem. 3. S/P MSSA HCAP/ VAP 4. ESRD -usual HD TTS at outpt unit but protracted admission so has been running MWF recently.sp CRRT 9/26- 10/7. HD 11/12, plan next will be 11/15.  4. BP/ volume - on midodrine 10 tid. Some dependent hip edema bilat. Wt's improving. UF w/ HD as tolerated --> 2.5-3L as tolerated 5. HD access: LUA AVF clotted while here. TDC x2 here by IR.[facility] TC 6. Anemia ckd/ critical illness - on darbe 150 weekly, 11/12 Ferritin >4000, iron sat 11.  With active infection and ^^ ferritin no IV iron. tx prbc's PRN, not currently indicated with Hb 8s.  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 Phos 3.9 10/30 > 9.2 11/8 --> 6.5.  RD switched to nepro and lowered FWF on 11/12. Follow 8. DM2 - per pmd   LJannifer HickMD [facility] Pager [phone]  Subjective:   No major changes - hydrotherapy started for sacral wound. Shakes head no for any issues today.    Objective:   BP 107/72 (BP Location: Right Arm)   Pulse (!) 111   Temp 98.7 F (37.1 C) (Oral)   Resp 19   Ht _0  (1.575 m)   Wt 102.8 kg   SpO2 98%   BMI 41.45 kg/m   Intake/Output Summary (Last 24 hours) at 04/15/2020 0814 Last data filed at 04/15/2020 03419Gross per 24 hour  Intake 1115 ml   Output 475 ml  Net 640 ml   Weight change:   Physical Exam: Awake, will localize to me and attend to conversation , +trach collar ,wiggles toes Lungs - very coarse, loose rhonchi but less than prior days, no rales, no coughing during exam CVS-tachy ABD- BS present soft non-distended  EXT- trace pedal edema, lt arm access thrombosed ACCESS: [facility] TC  Imaging: No results found.  Labs: BMET Recent Labs  Lab 04/09/20 0917 04/10/20 0518 04/11/20 1130 04/12/20 0520 04/13/20 0500 04/14/20 0719 04/15/20 0449  NA 140 141 143 140 138 135 132*  K 4.7 4.2 5.4* 4.1 4.8 4.0 4.1  CL 101 102 105 99 98 98 96*  CO2 20* _1 21*  GLUCOSE 249* 257* 244* 143* 140* 158* 148*  BUN 75* 42* 69* 36* 60* 45* 80*  CREATININE 5.30* 3.45* 5.12* 3.18* 4.55* 3.42* 4.49*  CALCIUM 9.0 9.2 9.3 9.2 9.4 9.4 9.1  PHOS 9.2*  --   --  6.5*  --  7.3* 8.9*   CBC Recent Labs  Lab 04/11/20 1130 04/11/20 1130 04/12/20 0520 04/13/20 0500 04/14/20 0719 04/15/20 0449  WBC 18.6*   < > 18.0* 17.5* 15.6* 13.8*  NEUTROABS 15.1*  --  14.6*  --   --   --  HGB 8.2*   < > 8.4* 7.9* 8.6* 8.1*  HCT 26.1*   < > 26.7* 24.8* 26.9* 25.1*  MCV 90.9   < > 91.4 91.2 90.9 89.6  PLT 405*   < > 427* 434* 430* 398   < > = values in this interval not displayed.    Medications:    . sodium chloride   Intravenous Once  . sodium chloride   Intravenous Once  . B-complex with vitamin C  1 tablet Per Tube Daily  . chlorhexidine gluconate (MEDLINE KIT)  15 mL Mouth Rinse BID  . Chlorhexidine Gluconate Cloth  6 each Topical Q0600  . clonazepam  0.5 mg Per Tube TID  . darbepoetin (ARANESP) injection - DIALYSIS  150 mcg Intravenous Q Wed-HD  . feeding supplement (PROSource TF)  45 mL Per Tube TID  . free water  20 mL Per Tube Q6H  . heparin injection (subcutaneous)  5,000 Units Subcutaneous Q8H  . hydrocerin   Topical Daily  . insulin aspart  0-20 Units Subcutaneous Q4H  . insulin aspart  7 Units Subcutaneous Q4H   . insulin detemir  30 Units Subcutaneous BID  . levothyroxine  125 mcg Per Tube Q0600  . mouth rinse  15 mL Mouth Rinse 10 times per day  . midodrine  10 mg Per Tube TID WC  . nutrition supplement (JUVEN)  1 packet Per Tube BID  . oxyCODONE  10 mg Per Tube Q6H  . pantoprazole sodium  40 mg Per Tube BID  . polyethylene glycol  17 g Per Tube Daily  . QUEtiapine  100 mg Per Tube BID  . sodium chloride flush  10-40 mL Intracatheter Q12H

## 2020-04-15 NOTE — Significant Event (Addendum)
Rapid Response Event Note   Reason for Call :  Bleeding from L buttock wound.  Pt had hydrotherapy done yesterday(11/13). Last dressing change was at 1000 today and copious serosanguinous drainage was charted.  Initial Focused Assessment:  Pt laying in bed in no distress. She will nod and shake head appropriately to questions, follow commands, and move all extremities. She denies chest pain/SOB/dizziness/lightheadness. Her skin is warm and dry. RN is holding pressure to L buttock wound. There is a continuous flow of blood coming out of wound despite constant pressure.  T-99.6, HR-120s, BP-120/63, RR-22, SpO2-100% on .28 TC.   Bleeding lessened once MD arrive to bedside. MD irrigated wound with NS and dressing was changed. Large clots seen in wound base-these were left undisturbed.   Interventions:  CBC STAT-Hgb 8.1>7.3 Manual pressure held x 40+ minutes.  Wound irrigated by MD and dressing changed.   Transfuse 1 unit PRBCs.  Plan of Care:  Dressing at this time is C/D/I. Transfuse blood and continue to monitor pt closely. Call RRT if further assistance needed.   Event Summary:   MD Notified:  Dr. Coy Saunas notified and came to bedside  Call [place]     Update: 2225-SBP-60-70s, HR-112, pt still at baseline neuro-wise per RN. Pt did received 169mcg fentanyl at 2059. 250cc NS ordered. RN to call bloodbank to see if PRBCs are ready.   Pt seen at 2232. Pt is lethargic but will awaken to verbal command, nod/shake head appropriately, but goes back to sleep very quickly. Skin cool to touch. Pt nods her head "yes" when asked if she's cold. SBP-80s, HR-120s. T and S drawn from pt's CVC and sent to lab. Dr. Coy Saunas to bedside to assess pt. Dressing assessed-it is saturated again and there is a continuous flow of blood again. Pressure held. Additional 250cc NS bolus ordered.   Dr. Lenore Cordia with general surgery came to beside. He removed the packing and was  able find an area that was spurting blood. He placed a suture in the site and bleeding stopped. He then placed combat gauze in the L part of the wound and then packed the wound with kerlex. 1 units PRBCs hung with bedside RN-plan for post transfusion CBC.    Update: 0443-SBP remains 70-80s. Pt sleepy but will wake up and is at her baseline neurologically. Lungs clear and diminished. Skin warm to touch. L buttock dressing C/D/I. Dr. Coy Saunas at bedside-500cc NS bolus given (this makes 1000cc NS total in boluses and 350cc PRBCs given for the night).  Plan: reassess BP after bolus.     Dillard Essex, RN

## 2020-04-15 NOTE — Significant Event (Signed)
MD team was paged to bedside due to significant bleeding from sacral wound. Nurse and rapid response team had been applying pressure to wound for approx 30 minutes. On our arrival, the bleeding had started to decrease. Dr. Coy Saunas irrigated wound in order to make dressing change easier. New dressing was applied once it was apparent that bleeding had subsided. Stat hgb had been ordered which showed downtrend from 8.1>7.3. Order placed to transfuse 1 unit pRBCs.   Patient was in apparent discomfort during dressing change process, but otherwise was awake, alert and HDS.   Appreciate nursing and RRT assistance in her care.   Modena Nunnery D, DO PGY-3 04/15/20, 10:45 PM

## 2020-04-15 NOTE — Significant Event (Signed)
General Surgery Significant Event  Surgical team called to bedside due to hemodynamically significant bleeding requiring transfusion despite adequate wound packing. The patient was in the right lateral decubitus position with dense blood soaked packing in her large sacral wound. Despite packing, the wound was still actively bleeding. Packing was removed. A large clot burden was encountered under the left lateral tissue flap. After clot removal, an arterial pressure bleeding vessel was encountered which was controlled with a figure-of-eight Vicryl suture. Hemostasis was achieve. A z-fold combat gauze was packed in the left lateral aspect of the wound followed by 2 dray Kerlix gauze rolls in the remainder of the wound with ABD pads covering the wound.   The primary nursing and physician team was instructed to contact general surgery again if there were any additional concerns of ongoing hemorrhage from the wound.   Sheria Lang, MD General Surgery

## 2020-04-15 NOTE — Progress Notes (Signed)
NAME:  Wanda Ramirez, MRN:  [id number], DOB:  [birth year], LOS: 85 ADMISSION DATE:  02/13/2020   Brief History  58 yof with ESRD on HD admitted on 02/13/20 for COVID 19 pneumonia which progressed to respiratory failure requiring intubation on 10/8. Unfortunately, she was unable to be weaned from the ventilator and tracheostomy was subsequently placed on 10/18. Her hospitalization has been complicated by septic shock requiring pressor support. Additionally complicated by a sacral decubitus ulcer infection for which she underwent a debridement for on 11/2.  Interm history/ Subjective   No significant overnight events.  Significant Hospital Events   9/14 Admission to general floor 9/23 Tunneled HD cath placed 9/26 Transferred to ICU for hypotension and CRRT 10/1 On CRRT and vasopressors 10/6 Extubated 10/16 Moved off COVID floor 10/18 tracheostomy performed 10/31 Restart vanc for staph in resp culture 11/2 Surgical debridement for worsening sacral wound 11/7 Off pressors 11/8 Off antibiotics, surgery sign off  11/10 IMTS to assume care 11/11 febrile. merrem started. Surgery reconsulted 11/12 no acute events 11/13 merrem stopped  Objective   Blood pressure 107/72, pulse (!) 108, temperature 98.7 F (37.1 C), temperature source Oral, resp. rate 18, height 5\' 2"  (1.575 m), weight 102.8 kg, SpO2 98 %.     Intake/Output Summary (Last 24 hours) at 04/15/2020 0504 Last data filed at 04/14/2020 1834 Gross per 24 hour  Intake 1115 ml  Output 475 ml  Net 640 ml   Filed Weights   04/11/20 1529 04/13/20 0725 04/13/20 1117  Weight: 107 kg 106 kg 102.8 kg    Examination: General: no acute distress, drowsy this morning. Cardiac: tachycardic rate, regular rhythm Pulm: on 5L via trach mask. Upper respiratory sounds appreciable on auscultation.   Consults:  Nephrology General surgery Palliative care  Significant Diagnostic Tests:  9/13 CXR>> bilateral patchy airspace  opacities 9/21 VQ scan>> no PE 9/29 echocardiogram>> normal LVEF, findings consistent with increased RV volume and fluid overload, moderately reduced RV function 10/28 CT abdomen/pelvis >>scattered ground glass and consolidative opacities in the lung bases. Cystic space in the anterior, subpleural RML with septations favoring complicated pneumatocele. Non-obstructive punctuate nephrolithiasis 11/1 CT abdomen/pelvis>> persistent multifocal bibasilar airspace disease with scarring. Bullous changes seen in RML. Decubitus ulcer overlying the sacrococcygeal junction with extensive gas seen in the left gluteal region involving the muscle and fat. No evidence of abscess. Findings consistent with cellulitis and myositis. No evidence of body destruction to suggest osteomyelitis.  Micro Data:  9/13 blood culture>>no growth 9/26 blood culture>> no growth 10/8 resp culture>>MSSA, resistant to clinda, erythromycin, clindamycin 10/8 blood culture>> no growth 10/18 resp culture>> normal resp flora 10/21 resp culture>> normal resp flora 10/24 blood culture>> no growth 10/25 resp culture>> few cornebacterium 10/29 resp culture>> MSSA, resistant to clinda, erythromycin, clinda 11/2 blood culture>> no growth 11/2 resp culture>> no growth 11/2 wound culture>> e.coli resistant to ampicillin, cephalosporins, bactrim, e. Faecalis pan sensitive 11/9 blood culture>> no growth 11/11 blood culture>> NGTD Antimicrobials:  Remdesivir 9/14>>9/19 Cefepime 9/20>>9/29 Azithromycin 9/20>>9/25 Zosyn 10/8, 11/1>11/8 Vanc 10/8, 10/31>>11/5 Rocephin>>10/10>>10/16 Merrem 11/11>>11/13  Procedures/lines  Tracheostomy shiley 6.0 XLT 10/18 Sacral wound debridement--11/2  Tunneled HD line 9/27>> Central line 11/2>> Rectal tube 10/2>> Cortrak 10/4>>  Central line 9/26-10/24 A-line 9/30>>10/13, 11/2-11/9 HD line 9/22-9/27 ETT 9/26-10/6 ETT 10/8-10/18  Labs    CBC Latest Ref Rng & Units 04/14/2020 04/13/2020  04/12/2020  WBC 4.0 - 10.5 K/uL 15.6(H) 17.5(H) 18.0(H)  Hemoglobin 12.0 - 15.0 g/dL 8.6(L) 7.9(L) 8.4(L)  Hematocrit 36 -  46 % 26.9(L) 24.8(L) 26.7(L)  Platelets 150 - 400 K/uL 430(H) 434(H) 427(H)   BMP Latest Ref Rng & Units 04/14/2020 04/13/2020 04/12/2020  Glucose 70 - 99 mg/dL 158(H) 140(H) 143(H)  BUN 6 - 20 mg/dL 45(H) 60(H) 36(H)  Creatinine 0.44 - 1.00 mg/dL 3.42(H) 4.55(H) 3.18(H)  Sodium 135 - 145 mmol/L 135 138 140  Potassium 3.5 - 5.1 mmol/L 4.0 4.8 4.1  Chloride 98 - 111 mmol/L 98 98 99  CO2 22 - 32 mmol/L 23 22 25   Calcium 8.9 - 10.3 mg/dL 9.4 9.4 9.2    Summary  85 yof admitted 02/17/20 for COVID-19 pneumonia who developed chronic respiratory failure requiring a tracheostomy. Her hospitalization has been complicated by recurrent respiratory infections as well as the development of a sacral decubitus ulcer. She was transferred to the IMTS 11/10 for ongoing care while awaiting the placement of a PEG tube.  Assessment & Plan:  Principal Problem:   COVID-19 Active Problems:   End stage renal disease on dialysis ([place])   Diabetes mellitus type 2 in obese (HCC)   OSA (obstructive sleep apnea)   HCAP (healthcare-associated pneumonia)   Acute respiratory failure with hypoxemia (HCC)   Encephalopathy acute   ARDS (adult respiratory distress syndrome) (HCC)   Pressure injury of skin   Aspiration into airway   Status post tracheostomy ([place])   Fever   Septic shock (HCC)   Cellulitis of buttock  ICU dilerium --continue seroquel, klonopin. If she continues to appear drowsy tomorrow, may need to consider backing off on these a little.  --delirium precautions. Shades open, lights on, up to chair if able during the day, lights off, TV off at night.  ESRD on HD THS. AVF clotted off resulting in need for use of tunneled HD line. Management per nephrology.   Anemia of CKD/critical illness Stable. On aranesp, midodrine. Will continue to monitor with daily labs.  Chronic  respiratory failure s/p tracheostomy secondary to COVID 19 pneumonia 6.0 shiley XLT cuffed. Worked with SLP who recommends downsizing trach. Awaiting supplies to arrive.  Remains on 5L supplemental O2 Plan --trach care --continue to work toward cuffless. Appreciate SLP and RT involvement  Sacral decubitus ulcer s/p debridement by gen surgery (11/2, 11/5). Unfortunately, unlikely to heal until more mobile and is high risk for infection Remains afebrile Multi-drug resistant bacteria as noted on prior wound culture.  Plan --continue hydrotherapy. General surgery will re-evaluate 11/15 --continue frequent repositioning --wound care --Pain management: oxycodone 10mg  q6h, prn fentanyl 164mcg with dressing changes  Type 2 Diabetes. CBGs at goal. Continue levemir 30U BID. Glucose range goal: 120-180. Continue novolog 7U q4h  High risk malnutrition. Will need PEG for ongoing nutrition.  --continue tube feeds via cortrak at this time.  --will discuss PEG tube placement with IR tomorrow  Cholestasis of critical illness. Confirmed with elevated GGT.  Physical deconditioning. PT/OT consulted.  Goals of care. Greatly appreciate palliative care's assistance with GOC and pain management  Best practice:  CODE STATUS: DNR Diet: tube feeds DVT for prophylaxis: heparin Social considerations/Family communication: will update Dispo: PEG tube placement   Mitzi Hansen, MD Internal Medicine Resident PGY-2 [facility] Pager: [phone] 04/15/2020 5:03 AM    Contact info:  Please page me at [phone] from 7am-5pm M-F.  Please use the IMTS after hours pager at [phone] after 5pm M-F and on weekends

## 2020-04-15 NOTE — Progress Notes (Signed)
Spoke with Daughter Marcie Bal to update her on her mom.

## 2020-04-15 NOTE — Progress Notes (Signed)
UPDATE: Team was paged again due to hypotension and continued bleeding from sacral wound. She was already receiving 250 cc boluses. Unit of blood had not been given yet due to expired type and screen.   At this point, paged general surgery resident on-call who immediately came to bedside and was able to achieve hemostasis with suturing and packing with combat gauze.  Patient remained awake and alert throughout, though did seem more agitated. She is s/p 250 cc boluses x2. Will transfuse unit over an hour. Blood pressure should improve now that hemostasis has been achieved and with transfusion.   Greatly appreciate assistance from general surgery.   Modena Nunnery D, DO PGY-3 04/15/20, 11:56 PM

## 2020-04-15 NOTE — Progress Notes (Signed)
Late Entry. Trach changed on 04/14/20 at 1131 per MD order to # 6 Shiley XLT cuffless, without complications. Positive CO2 exchange noted and SPO2 99%.

## 2020-04-16 DIAGNOSIS — Z515 Encounter for palliative care: Secondary | ICD-10-CM

## 2020-04-16 DIAGNOSIS — J9601 Acute respiratory failure with hypoxia: Secondary | ICD-10-CM | POA: Diagnosis not present

## 2020-04-16 DIAGNOSIS — L03317 Cellulitis of buttock: Secondary | ICD-10-CM | POA: Diagnosis not present

## 2020-04-16 DIAGNOSIS — U071 COVID-19: Secondary | ICD-10-CM | POA: Diagnosis not present

## 2020-04-16 LAB — RENAL FUNCTION PANEL
Albumin: 1.6 g/dL — ABNORMAL LOW (ref 3.5–5.0)
Anion gap: 18 — ABNORMAL HIGH (ref 5–15)
BUN: 101 mg/dL — ABNORMAL HIGH (ref 6–20)
CO2: 17 mmol/L — ABNORMAL LOW (ref 22–32)
Calcium: 9 mg/dL (ref 8.9–10.3)
Chloride: 95 mmol/L — ABNORMAL LOW (ref 98–111)
Creatinine, Ser: 5.6 mg/dL — ABNORMAL HIGH (ref 0.44–1.00)
GFR, Estimated: 9 mL/min — ABNORMAL LOW (ref >60)
Glucose, Bld: 226 mg/dL — ABNORMAL HIGH (ref 70–99)
Phosphorus: 11.1 mg/dL — ABNORMAL HIGH (ref 2.5–4.6)
Potassium: 5.8 mmol/L — ABNORMAL HIGH (ref 3.5–5.1)
Sodium: 130 mmol/L — ABNORMAL LOW (ref 135–145)

## 2020-04-16 LAB — CBC
HCT: 24 % — ABNORMAL LOW (ref 36.0–46.0)
HCT: 20.8 % — ABNORMAL LOW (ref 36.0–46.0)
Hemoglobin: 7.9 g/dL — ABNORMAL LOW (ref 12.0–15.0)
Hemoglobin: 7 g/dL — ABNORMAL LOW (ref 12.0–15.0)
MCH: 29.3 pg (ref 26.0–34.0)
MCH: 29.3 pg (ref 26.0–34.0)
MCHC: 32.9 g/dL (ref 30.0–36.0)
MCHC: 33.7 g/dL (ref 30.0–36.0)
MCV: 88.9 fL (ref 80.0–100.0)
MCV: 87 fL (ref 80.0–100.0)
Platelets: 396 10*3/uL (ref 150–400)
Platelets: 289 10*3/uL (ref 150–400)
RBC: 2.7 MIL/uL — ABNORMAL LOW (ref 3.87–5.11)
RBC: 2.39 MIL/uL — ABNORMAL LOW (ref 3.87–5.11)
RDW: 14.7 % (ref 11.5–15.5)
RDW: 14.8 % (ref 11.5–15.5)
WBC: 20.9 10*3/uL — ABNORMAL HIGH (ref 4.0–10.5)
WBC: 10.9 10*3/uL — ABNORMAL HIGH (ref 4.0–10.5)
nRBC: 0.1 % (ref 0.0–0.2)
nRBC: 0 % (ref 0.0–0.2)

## 2020-04-16 LAB — LACTIC ACID, PLASMA: Lactic Acid, Venous: 1.3 mmol/L (ref 0.5–1.9)

## 2020-04-16 LAB — GLUCOSE, CAPILLARY
Glucose-Capillary: 105 mg/dL — ABNORMAL HIGH (ref 70–99)
Glucose-Capillary: 152 mg/dL — ABNORMAL HIGH (ref 70–99)
Glucose-Capillary: 165 mg/dL — ABNORMAL HIGH (ref 70–99)
Glucose-Capillary: 175 mg/dL — ABNORMAL HIGH (ref 70–99)
Glucose-Capillary: 197 mg/dL — ABNORMAL HIGH (ref 70–99)
Glucose-Capillary: 86 mg/dL (ref 70–99)

## 2020-04-16 LAB — PROTIME-INR
INR: 1 (ref 0.8–1.2)
Prothrombin Time: 13.1 seconds (ref 11.4–15.2)

## 2020-04-16 LAB — PREPARE RBC (CROSSMATCH)

## 2020-04-16 LAB — HEPATITIS B SURFACE ANTIGEN: Hepatitis B Surface Ag: NONREACTIVE

## 2020-04-16 LAB — HEPATITIS B CORE ANTIBODY, TOTAL: Hep B Core Total Ab: NONREACTIVE

## 2020-04-16 MED ORDER — SODIUM CHLORIDE 0.9% IV SOLUTION: Freq: Once | INTRAVENOUS | Status: DC

## 2020-04-16 MED ORDER — SODIUM CHLORIDE 0.9 % IV SOLN: 100.0000 mL | INTRAVENOUS | Status: DC | PRN

## 2020-04-16 MED ORDER — LIDOCAINE-PRILOCAINE 2.5-2.5 % EX CREA: 1.0000 "application " | TOPICAL_CREAM | CUTANEOUS | Status: DC | PRN

## 2020-04-16 MED ORDER — ALBUMIN HUMAN 25 % IV SOLN
25.0000 g | Freq: Four times a day (QID) | INTRAVENOUS | Status: AC
  Administered 2020-04-16 – 2020-04-17 (×3): 25 g via INTRAVENOUS
  Filled 2020-04-16 (×3): qty 100

## 2020-04-16 MED ORDER — FENTANYL CITRATE (PF) 100 MCG/2ML IJ SOLN
200.0000 ug | INTRAMUSCULAR | Status: DC | PRN
  Administered 2020-04-16: 100 ug via INTRAVENOUS

## 2020-04-16 MED ORDER — FENTANYL CITRATE (PF) 100 MCG/2ML IJ SOLN
100.0000 ug | INTRAMUSCULAR | Status: DC | PRN
  Administered 2020-04-17 – 2020-04-19 (×7): 100 ug via INTRAVENOUS
  Administered 2020-04-21 – 2020-04-23 (×3): 200 ug via INTRAVENOUS
  Administered 2020-04-24 – 2020-05-09 (×11): 100 ug via INTRAVENOUS
  Filled 2020-04-16 (×2): qty 2
  Filled 2020-04-16: qty 4
  Filled 2020-04-16 (×2): qty 2
  Filled 2020-04-16: qty 4
  Filled 2020-04-16 (×7): qty 2
  Filled 2020-04-16: qty 4
  Filled 2020-04-16 (×3): qty 2
  Filled 2020-04-16 (×2): qty 4
  Filled 2020-04-16 (×2): qty 2

## 2020-04-16 MED ORDER — NOREPINEPHRINE 4 MG/250ML-% IV SOLN
0.0000 ug/min | INTRAVENOUS | Status: DC
  Administered 2020-04-16: 2 ug/min via INTRAVENOUS
  Filled 2020-04-16: qty 250

## 2020-04-16 MED ORDER — PENTAFLUOROPROP-TETRAFLUOROETH EX AERO: 1.0000 "application " | INHALATION_SPRAY | CUTANEOUS | Status: DC | PRN

## 2020-04-16 MED ORDER — HEPARIN SODIUM (PORCINE) 1000 UNIT/ML DIALYSIS
1000.0000 [IU] | INTRAMUSCULAR | Status: DC | PRN
  Administered 2020-04-18: 1000 [IU] via INTRAVENOUS_CENTRAL
  Filled 2020-04-16 (×5): qty 1

## 2020-04-16 MED ORDER — LIDOCAINE HCL (PF) 1 % IJ SOLN: 5.0000 mL | INTRAMUSCULAR | Status: DC | PRN

## 2020-04-16 MED ORDER — SODIUM ZIRCONIUM CYCLOSILICATE 5 G PO PACK
10.0000 g | PACK | Freq: Three times a day (TID) | ORAL | Status: DC
  Administered 2020-04-16 (×3): 10 g
  Filled 2020-04-16 (×4): qty 2

## 2020-04-16 MED ORDER — SODIUM CHLORIDE 0.9 % IV BOLUS
500.0000 mL | Freq: Once | INTRAVENOUS | Status: AC
  Administered 2020-04-16: 500 mL via INTRAVENOUS

## 2020-04-16 MED ORDER — ALTEPLASE 2 MG IJ SOLR: 2.0000 mg | Freq: Once | INTRAMUSCULAR | Status: DC | PRN

## 2020-04-16 NOTE — Progress Notes (Signed)
NAME:  Wanda Ramirez, MRN:  [id number], DOB:  1970-08-09, LOS: 68 ADMISSION DATE:  02/13/2020   Brief History  68 yof with ESRD on HD admitted on 02/13/20 for COVID 19 pneumonia which progressed to respiratory failure requiring intubation on 10/8. Unfortunately, she was unable to be weaned from the ventilator and tracheostomy was subsequently placed on 10/18. Her hospitalization has been complicated by septic shock requiring pressor support. Additionally complicated by a sacral decubitus ulcer infection for which she underwent a debridement for on 11/2.  Interm history/ Subjective   Overnight notes indicate profuse bleeding from sacral wound resulting in hemodynamic instability. Sugical resident went to bedside and placed a suture on a visible bleed, packed it, and placed a pressure dressing.   I received notification this morning by bedside RN that pt's blood pressures have been declining. On bedside assessment, blood pressure was 64/27 (MAP 37). She was about halfway through her 2nd unit of blood and has received about 1.5L fluids.  Discussed case with PCCM who was agreeable to assuming care. Levophed started at 17mcg. She will be transferred to the unit once bed is available.  Pt's daughter, Wilfred Lacy, updated on plan. Still wishes to pursue full scope of care.  Significant Hospital Events   9/14 Admission to general floor 9/23 Tunneled HD cath placed 9/26 Transferred to ICU for hypotension and CRRT 10/1 On CRRT and vasopressors 10/6 Extubated 10/16 Moved off COVID floor 10/18 tracheostomy performed 10/31 Restart vanc for staph in resp culture 11/2 Surgical debridement for worsening sacral wound 11/7 Off pressors 11/8 Off antibiotics, surgery sign off  11/10 IMTS to assume care 11/11 febrile. merrem started. Surgery reconsulted 11/12 no acute events 11/13 merrem stopped 11/14 hemodynamic significant sacral wound bleeding. Surgery placed stitch. 11/15 hypotensive. Transferred  back on ICU for pressor support  Objective   Blood pressure (!) 86/50, pulse (!) 110, temperature 98.4 F (36.9 C), resp. rate 19, height 5\' 2"  (1.575 m), weight 102.8 kg, SpO2 100 %.     Intake/Output Summary (Last 24 hours) at 04/16/2020 0800 Last data filed at 04/16/2020 0134 Gross per 24 hour  Intake 315 ml  Output --  Net 315 ml   Filed Weights   04/11/20 1529 04/13/20 0725 04/13/20 1117  Weight: 107 kg 106 kg 102.8 kg    Examination: General: critically and chronically ill appearing. Cardiac: tachycardic rate, regular rhythm. Extremities warm. Pulm: breathing comfortably on 5L via trach mask. Upper respiratory sounds appreciable on auscultation.    Neuro: drowsy but easily arousable. Opens eyes to voice. Responded appropriately to questions by nodding and shaking her head.  Consults:  Nephrology General surgery Palliative care Pulm  Significant Diagnostic Tests:  9/13 CXR>> bilateral patchy airspace opacities 9/21 VQ scan>> no PE 9/29 echocardiogram>> normal LVEF, findings consistent with increased RV volume and fluid overload, moderately reduced RV function 10/28 CT abdomen/pelvis >>scattered ground glass and consolidative opacities in the lung bases. Cystic space in the anterior, subpleural RML with septations favoring complicated pneumatocele. Non-obstructive punctuate nephrolithiasis 11/1 CT abdomen/pelvis>> persistent multifocal bibasilar airspace disease with scarring. Bullous changes seen in RML. Decubitus ulcer overlying the sacrococcygeal junction with extensive gas seen in the left gluteal region involving the muscle and fat. No evidence of abscess. Findings consistent with cellulitis and myositis. No evidence of body destruction to suggest osteomyelitis.  Micro Data:  9/13 blood culture>>no growth 9/26 blood culture>> no growth 10/8 resp culture>>MSSA, resistant to clinda, erythromycin, clindamycin 10/8 blood culture>> no growth 10/18 resp culture>>  normal  resp flora 10/21 resp culture>> normal resp flora 10/24 blood culture>> no growth 10/25 resp culture>> few cornebacterium 10/29 resp culture>> MSSA, resistant to clinda, erythromycin, clinda 11/2 blood culture>> no growth 11/2 resp culture>> no growth 11/2 wound culture>> e.coli resistant to ampicillin, cephalosporins, bactrim, e. Faecalis pan sensitive 11/9 blood culture>> no growth 11/11 blood culture>> NGTD Antimicrobials:  Remdesivir 9/14>>9/19 Cefepime 9/20>>9/29 Azithromycin 9/20>>9/25 Zosyn 10/8, 11/1>11/8 Vanc 10/8, 10/31>>11/5 Rocephin>>10/10>>10/16 Merrem 11/11>>11/13  Procedures/lines  Tracheostomy shiley 6.0 XLT 10/18 Sacral wound debridement--11/2  Tunneled HD line 9/27>> Central line 11/2>> Rectal tube 10/2>> Cortrak 10/4>>  Central line 9/26-10/24 A-line 9/30>>10/13, 11/2-11/9 HD line 9/22-9/27 ETT 9/26-10/6 ETT 10/8-10/18  Labs    CBC Latest Ref Rng & Units 04/16/2020 04/15/2020 04/15/2020  WBC 4.0 - 10.5 K/uL 20.9(H) 15.6(H) 13.8(H)  Hemoglobin 12.0 - 15.0 g/dL 7.9(L) 7.3(L) 8.1(L)  Hematocrit 36 - 46 % 24.0(L) 22.8(L) 25.1(L)  Platelets 150 - 400 K/uL 396 423(H) 398   BMP Latest Ref Rng & Units 04/16/2020 04/15/2020 04/14/2020  Glucose 70 - 99 mg/dL 226(H) 148(H) 158(H)  BUN 6 - 20 mg/dL 101(H) 80(H) 45(H)  Creatinine 0.44 - 1.00 mg/dL 5.60(H) 4.49(H) 3.42(H)  Sodium 135 - 145 mmol/L 130(L) 132(L) 135  Potassium 3.5 - 5.1 mmol/L 5.8(H) 4.1 4.0  Chloride 98 - 111 mmol/L 95(L) 96(L) 98  CO2 22 - 32 mmol/L 17(L) 21(L) 23  Calcium 8.9 - 10.3 mg/dL 9.0 9.1 9.4    Summary  61 yof admitted 02/17/20 for COVID-19 pneumonia who developed chronic respiratory failure requiring a tracheostomy. Her hospitalization has been complicated by recurrent respiratory infections as well as the development of a sacral decubitus ulcer. She was transferred to the IMTS 11/10 for ongoing care while awaiting the placement of a PEG tube. Unfortunately, she developed  hemorrhagic shock on 11/14 overnight and was ultimately transferred back to ICU for pressor support.  Assessment & Plan:  Principal Problem:   COVID-19 Active Problems:   End stage renal disease on dialysis ([place])   Diabetes mellitus type 2 in obese (HCC)   OSA (obstructive sleep apnea)   HCAP (healthcare-associated pneumonia)   Acute respiratory failure with hypoxemia (HCC)   Encephalopathy acute   ARDS (adult respiratory distress syndrome) (HCC)   Pressure injury of skin   Aspiration into airway   Status post tracheostomy ([place])   Fever   Septic shock (HCC)   Cellulitis of buttock  ICU dilerium --continue seroquel, klonopin. --delirium precautions. Shades open, lights on, up to chair if able during the day, lights off, TV off at night.  ESRD on HD THS. AVF clotted off resulting in need for use of tunneled HD line. Due for HD today but will need to defer at the present time due to pressures. Discussed with nephrology who does not feel she is a CRRT candidate. Will need to monitor volume status in the setting of multiple transfusions/fluids. Management per nephrology.   Hemorrhagic shock secondary to sacral wound bleed. Progressively hypotensive since last night despite volume resuscitation. MAPs ranging between 37-58 this morning. Hemostasis achieved with assistance of suture placement by surgery last night.  I discussed her case with critical care as I suspect she will need pressor support for the time being. Critical care will assume care today and transfer her to the unit. Levo ordered @10mcg  for the time being. Central access in place in case it needs to be titrated up. So far seems to be responding well to levo. --Will hold off on  HD and hydrotherapy for the present time.  --Post transfusion h/h --Transfer to ICU  Anemia of CKD/critical illness . On aranesp, midodrine.   Chronic respiratory failure s/p tracheostomy secondary to COVID 19 pneumonia. Stable on trach collar on 5L   6.0 shiley XLT cuffless.  Plan --trach care --rest per trach team  Sacral decubitus ulcer s/p debridement by gen surgery (11/2, 11/5). Now with hemodynamic significant bleeding--see above Unfortunately, unlikely to heal until more mobile and is high risk for infection Multi-drug resistant bacteria as noted on prior wound culture.  Plan --hold hydrotherapy today. Appreciate GI recs --continue frequent repositioning --wound care --Pain management: oxycodone 10mg  q6h, prn fentanyl 174mcg with dressing changes  Type 2 Diabetes. CBGs at goal. Continue levemir 30U BID. Glucose range goal: 120-180. Continue novolog 7U q4h  High risk malnutrition. Will need PEG tube at some point. Continue tube feeds via cortrak at this time.   Cholestasis of critical illness. Confirmed with elevated GGT.  Physical deconditioning. PT/OT consulted.  Goals of care. Greatly appreciate palliative care's assistance with GOC and pain management  Best practice:  CODE STATUS: DNR Diet: tube feeds DVT for prophylaxis: heparin Social considerations/Family communication: daughter updated Dispo: ICU   Mitzi Hansen, MD Internal Medicine Resident PGY-2 [facility] Pager: [phone] 04/16/2020 8:00 AM    Contact info:  Please page me at [phone] from 7am-5pm M-F.  Please use the IMTS after hours pager at [phone] after 5pm M-F and on weekends

## 2020-04-16 NOTE — Consult Note (Signed)
Consultation Note Date: 04/16/2020   Patient Name: Wanda Ramirez  DOB: 1970/09/11  MRN: [id number]  Age / Sex: 49 y.o., female  PCP: Welford Roche, NP Referring Physician: Candee Furbish, MD  Reason for Consultation: Establishing goals of care  HPI/Patient Profile: 49 y.o. female  with past medical history of ESRD, DM, OSA, chronic back pain, asthma, arthritis and morbid obesity who was admitted on 02/13/2020 with COVID 19.  She has had a long hospital course including intubation and reintubation, tracheostomy, large unstageable sacral wound with necrotizing soft tissue infection status post OR debridement x 2.  Last night (11/14) she suffered arterial hemorrhage from her wound and was transfused.  She is currently in the ICU on pressors for BP support.  Clinical Assessment and Goals of Care:  I have reviewed medical records including EPIC notes, labs and imaging, received report from ICU RN, examined the patient and spoke on phone with her daughter, Ms. Voncille Lo to discuss diagnosis prognosis, and GOC.  I introduced Palliative Medicine as specialized medical care for people living with serious illness. It focuses on providing relief from the symptoms and stress of a serious illness.   [name] explained that she and her siblings (two sisters and one brother) are making decisions for their mother together but she has been the one to communicate for the group with the doctors.  She said she has been unable to come to the hospital personally (our connection went out momentarily when she explained why she is unable to come in) but she offered to have one of her siblings meet with me.    We talked about the very severe case of COVID her mother has experienced and now an severe unstageable, unhealing wound that is the result of a necrotizing soft tissue infection.      I expressed my concern to Ms. Ormand that  this wound covers her entire sacral area, goes down to the bone and will not heal.   Ms. Vargus expressed that the family is not "giving up" on the patient and she requested that Palliative help with her symptoms of pain.  We discussed the severe bleed overnight and the resulting hypotension.  I explained that pain medications often drop blood pressure making it difficult to keep the patient safe (normotensive) and control her pain at the same time.  I asked if Ms. Skaff and her siblings wanted to speak with me as a group on the phone tomorrow.  She agreed and we set a meeting time for 3:45 pm.  Primary Decision Maker:  NEXT OF KIN Adult Children.  Brayah Urquilla is the point of contact.    SUMMARY OF RECOMMENDATIONS    PMT added a range of fentanyl PRN for pain control. PMT follow up meeting on 11/16 at 3:45 via telephone.  Code Status/Advance Care Planning:  DNR   Symptom Management:   As above.  Palliative Prophylaxis:   Frequent Pain Assessment  Psycho-social/Spiritual:   Desire for further Chaplaincy support: not discussed.  Prognosis:  Very concerning given severe COVID illness with refractory hypoxic respiratory failure, severe unstageable sacral wound, poor nutrition despite artificial feedings, patient now debilitated, and fragile.    Discharge Planning: To Be Determined      Primary Diagnoses: Present on Admission: . COVID-19 . Diabetes mellitus type 2 in obese ([place]) . OSA (obstructive sleep apnea)   I have reviewed the medical record, interviewed the patient and family, and examined the patient. The following aspects are pertinent.  Past Medical History:  Diagnosis Date  . Arthritis   . Asthma   . Chronic back pain   . Diabetes mellitus ([place])   . ESRD (end stage renal disease) on dialysis ([facility])    TTS  . Essential hypertension   . GERD (gastroesophageal reflux disease)   . Glaucoma   . Hyperlipidemia   . Hypothyroidism   . Morbid obesity ([place])    . Peripheral neuropathy   . Sleep apnea    wears CPAP, does not know setting   Social History   Socioeconomic History  . Marital status: Single    Spouse name: Not on file  . Number of children: Not on file  . Years of education: Not on file  . Highest education level: Not on file  Occupational History  . Occupation: disabled  Tobacco Use  . Smoking status: Former Research scientist (life sciences)  . Smokeless tobacco: Never Used  . Tobacco comment: Smoked x 2 yrs  Vaping Use  . Vaping Use: Never used  Substance and Sexual Activity  . Alcohol use: No    Comment: years ago - has since quit  . Drug use: No  . Sexual activity: Not on file  Other Topics Concern  . Not on file  Social History Narrative   Pt lives alone in 2 story home (has 2 aides that occasionally stay the night)   Has 4 children   11th grade education   Has never been gainfully employed   Social Determinants of Radio broadcast assistant Strain:   . Difficulty of Paying Living Expenses: Not on file  Food Insecurity:   . Worried About Charity fundraiser in the Last Year: Not on file  . Ran Out of Food in the Last Year: Not on file  Transportation Needs:   . Lack of Transportation (Medical): Not on file  . Lack of Transportation (Non-Medical): Not on file  Physical Activity:   . Days of Exercise per Week: Not on file  . Minutes of Exercise per Session: Not on file  Stress:   . Feeling of Stress : Not on file  Social Connections:   . Frequency of Communication with Friends and Family: Not on file  . Frequency of Social Gatherings with Friends and Family: Not on file  . Attends Religious Services: Not on file  . Active Member of Clubs or Organizations: Not on file  . Attends Archivist Meetings: Not on file  . Marital Status: Not on file   Family History  Problem Relation Age of Onset  . Diabetes Mother   . Hyperlipidemia Mother   . Kidney disease Father     Allergies  Allergen Reactions  . Hydrocodone  Itching and Nausea And Vomiting     Vital Signs: BP 116/67   Pulse (!) 109   Temp 98.1 F (36.7 C) (Axillary)   Resp 18   Ht 5\' 2"  (1.575 m)   Wt 102.8 kg   SpO2 96%   BMI 41.45 kg/m  Pain  Scale: 0-10 POSS *See Group Information*: S-Acceptable,Sleep, easy to arouse Pain Score: 3    SpO2: SpO2: 96 % O2 Device:SpO2: 96 % O2 Flow Rate: .O2 Flow Rate (L/min): 5 L/min    Palliative Assessment/Data: 20%     Time In: 3:00 Time Out: 4:00 Time Total: 60 min. Visit consisted of counseling and education dealing with the complex and emotionally intense issues surrounding the need for palliative care and symptom management in the setting of serious and potentially life-threatening illness. Greater than 50%  of this time was spent counseling and coordinating care related to the above assessment and plan.  Signed by: Florentina Jenny, PA-C Palliative Medicine  Please contact Palliative Medicine Team phone at [phone] for questions and concerns.  For individual provider: See Shea Evans

## 2020-04-16 NOTE — Progress Notes (Signed)
Assisted tele visit to patient with daughter.  Amel Kitch Ann, RN  

## 2020-04-16 NOTE — Progress Notes (Signed)
   Spoke to Dr Charlsie Quest today  Regarding G tube insertion in IR  He has asked [place] to cancel for now He will re order when he feels pt is stable and appropriate for same

## 2020-04-16 NOTE — Progress Notes (Signed)
Assisted tele visit to patient with family member.  Shatia Sindoni D Deseray Daponte, RN   

## 2020-04-16 NOTE — Progress Notes (Addendum)
MD went to the bedside to assess patient after receiving page about persistently low BPs even after 1u pRBC and 500 mL IVNS overnight. No acute change on my assessment, no respiratory distress but patient remained hypotensive at 79/53 with HR of 110. Lungs CTAB bilaterally on anterior and middle lung fields. No signs of active bleeding from sacral wound. Slight improvement of BP to 90/56 with MAP of 66 after evaluating patient. After discussing with senior, ordered another 500 mL IV NS bolus with plans to reassess response after the bolus. Will consider ICU transfer for pressure support if BP remains low.   Lacinda Axon, MD 04/16/2020, 5:23 AM

## 2020-04-16 NOTE — Progress Notes (Signed)
Patient blood pressure 78/55 this morning.Dr. Coy Saunas called to come assess patient.

## 2020-04-16 NOTE — Progress Notes (Signed)
SLP Cancellation Note  Patient Details Name: Wanda Ramirez MRN: [id number] DOB: 11-06-1970   Cancelled treatment:        Pt transferred to ICU - hypotensive. Will continue efforts.    [name] 04/16/2020, 10:48 AM   Orbie Pyo Colvin Caroli.Ed Risk analyst [phone] Office [phone]

## 2020-04-16 NOTE — Progress Notes (Signed)
Started and titrated Levophed gtt up to 80mcg/min via central line.  Assisted with transport to 2M13.

## 2020-04-16 NOTE — Progress Notes (Signed)
During patient assessment, blood was found to be pooling in groin area, patient was turned to assess L buttock wound where a copious amount of blood and clotting blood was found on bed pad, wound packing was saturated and a continuous flow of blood draining from wound.  Pt was able to nod appropriately to questions and did not appear to be in distress.  Vitals where obtained, rapid response was called and MD notified.

## 2020-04-16 NOTE — Progress Notes (Signed)
PT Cancellation Note  Patient Details Name: Wanda Ramirez MRN: [id number] DOB: 1970/12/25   Cancelled Treatment:    Reason Eval/Treat Not Completed: Medical issues which prohibited therapy. Discussed pt case with RN who states hydrotherapy to hold per MD 2 hypotension and bleeding. Will resume tomorrow if appropriate for hydrotherapy treatment.    [name] 04/16/2020, 9:20 AM   Rolinda Roan, PT, DPT Acute Rehabilitation Services Pager: [phone] Office: [phone]

## 2020-04-16 NOTE — Progress Notes (Addendum)
   04/15/20 2215  Assess: MEWS Score  BP (!) 79/42  Assess: MEWS Score  MEWS Temp 0  MEWS Systolic 2  MEWS Pulse 2  MEWS RR 0  MEWS LOC 0  MEWS Score 4  MEWS Score Color Red  Assess: if the MEWS score is Yellow or Red  Were vital signs taken at a resting state? Yes  Focused Assessment Change from prior assessment (see assessment flowsheet)  Early Detection of Sepsis Score *See Row Information* Medium  MEWS guidelines implemented *See Row Information* Yes  Treat  MEWS Interventions Escalated (See documentation below)  Take Vital Signs  Increase Vital Sign Frequency  Red: Q 1hr X 4 then Q 4hr X 4, if remains red, continue Q 4hrs  Escalate  MEWS: Escalate Red: discuss with charge nurse/RN and provider, consider discussing with RRT  Notify: Charge Nurse/RN  Date Charge Nurse/RN Notified 04/15/20  Time Charge Nurse/RN Notified 2215  Notify: Provider  Provider Name/Title Dr. Coy Saunas (Dr. Coy Saunas)  Date Provider Notified 04/15/20  Time Provider Notified 2215  Notification Type Page  Notification Reason Change in status  Response See new orders  Date of Provider Response 04/15/20  Time of Provider Response 2220  Notify: Rapid Response  Name of Rapid Response RN Notified Mindy, RN  Date Rapid Response Notified 04/15/20  Time Rapid Response Notified 2215  Document  Patient Outcome Not stable and remains on department  Progress note created (see row info) Yes

## 2020-04-16 NOTE — Progress Notes (Signed)
Came to evaluate patient's wound around 1115 this AM. Dressing had just been changed by nursing staff. No active bleeding reported. May be able to resume hydrotherapy tomorrow, will assess wound tomorrow.   Norm Parcel, [facility] 04/16/2020, 12:04 PM Please see Amion for pager number during day hours 7:00am-4:30pm  NO CHARGE

## 2020-04-16 NOTE — Progress Notes (Signed)
Pt's HD tx modified to 3.5hours per Dr. Roney Jaffe.

## 2020-04-16 NOTE — Progress Notes (Addendum)
NAME:  Wanda Ramirez, MRN:  [id number], DOB:  January 03, 1971, LOS: 68 ADMISSION DATE:  02/13/2020, CONSULTATION DATE:  9/24 REFERRING MD:  Heber Arrington, CHIEF COMPLAINT:  Dyspnea   Brief History   49 yo female admitted with COVID 19 pneumonia on 9/13, had been diagnosed with COVID on 9/9.  On 9/24 her condition worsened and PCCM was consulted, she was sent to the ICU and treated with BIPAP, then eventually intubated.  Started on CRRT through a tunneled HD cath. Extubated on 10/6 but re-intubated on 10/8. Now sp trach tolerating trach collar.   Past Medical History  Sleep apnea Morbid obesity Hypothyroidism Hypertension GERD  End-stage renal disease on HD MWF Diabetes Asthma  Significant Hospital Events   9/14 admit 9/25 on BiPAP with Precedex overnight 9/26 -unable to run CRRT with prone ventilation.  Resume supine ventilation but still unable to draw blood back. 9/27 transferred to 19m. IR replaced HD catheter 10/1 on CRRT and vasopressors 10/6 - extubated 10/7 - had some stridor and increased WOB so placed on BIPAP and dexamethasone given.  10/15 weaning sedation. midaz off 10/16 move to 9M 10/18 s/p 6 proximal XLT Shiley tracheostomy  10/19: Resumed Eliquis low dose. Per Dr. Thompson Caul note 10/7 will need to be on [facility] for 1 month then reasses 10/19: Episode of hypoglycemia 55. 10/21 hypoglycemic again. levemir and tubefeed coverage decreased. Low grade temp->sputum culture send had vomiting event on 10/19 w/ concern for aspiration. Fent was stopped 10/20. Still gets anxious at night. Required increased precedex. Added clonazepam. 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 11/2 Surgical debridement  11/5 off vanc 11/7 off pressors 11/8 On trach  collar, off zosyn surgery evaluated no further need for debridement  Consults:  Nephrology  Procedures:  9/23 tunneled HD cath placement >  10/8 ETT > 10/18 10/18 6 proximal XLT Shiley tracheostomy  >  Significant Diagnostic Tests:  9/21 Vas ultrasound> thrombosed LUE fistula 9/21 VQ scan > negative for PE 9/29 echo> LVEF 60-65%, flat ventrcile consistent with RV pressure overload, RV severely enlarged, moderate reduction in RV function  Micro Data:  9/13 blood > neg   9/14 sars cov 2 > pos 9/26 blood > neg 10/8 resp > MSSA 10/8 blood > negative  10/21 sputum > Normal flora  10/25 Blood > ngtd x 3 days 10/25 Sputum  > few diphtheroids  10/25 Blood >>negative 10/29 Sputum >> Staph aureus 11/2 Blood>> negative 11/2 sputum >> negative 11/2 Wound >> E. Coli, enterococcus faecalis  Antimicrobials:  9/14 remdesivir > 9/19 9/15 tocilizumab > x1 9/20 cefepime > 9/29 9/20 azithro >  9/25 10/8 vanc > 10/9 10/8 zosyn > x1 10/10 ceftriaxone > 10/16 10/31 vancomycin >> 1/7 11/2 zosyn>>11/8 Interim history/subjective:  Alert and interactive with ability to follow simple commands this morning Endorses significant pain with any movement  Objective   Blood pressure (!) 86/50, pulse (!) 110, temperature 98.4 F (36.9 C), resp. rate 19, height 5\' 2"  (1.575 m), weight 102.8 kg, SpO2 100 %.    FiO2 (%):  [28 %] 28 %   Intake/Output Summary (Last 24 hours) at 04/16/2020 0757 Last data filed at 04/16/2020 0134 Gross per 24 hour  Intake 315 ml  Output --  Net 315 ml   Danley Danker  Weights   04/11/20 1529 04/13/20 0725 04/13/20 1117  Weight: 107 kg 106 kg 102.8 kg   Examination: General: Chronically ill appearing very deconditioned frail adult female lying in bed in moderate discomfort from significant sacral wound HEENT: 6 uncuffed proximal XLT Shiley, MM pink/moist, PERRL,  Neuro: Alert and interactive with ability to follow simple commands this morning CV: s1s2 regular rate and  rhythm, no murmur, rubs, or gallops,  PULM: Clear to auscultation bilaterally, no increased work of breathing, trach appears well-positioned with no secretions GI: soft, bowel sounds active in all 4 quadrants, non-tender, non-distended, tolerating TF Extremities: warm/dry, no edema  Skin: See image of significant decubitus ulcer with post surgical debridement    Resolved Hospital Problem list   Thrombocytopenia  MSSA PNA  Nausea  Agitated delirium Shock  Assessment & Plan:   Likely early development of hemorrhagic shock Overnight on 11/14 patient developed hypotension with significant bleeding identified from decubitus/surgical wound. General surgery evaluated with evidence of arterial bleed seen, this was sutured with hemostasis seen Acute blood loss anemia -Hemoglobin this a.m. 7.9 P: General surgery following for wound care, appreciate assistance Continue pressure dressing to be removed per general surgery Transfer to ICU for pressor support Began norepinephrine for map goal greater than 65 Every 6 hours H&H Has received 2 additional units of packed red blood cells overnight Hold subcutaneous heparin, resume when able   Acute hypoxic respiratory failure from COVID 19 pneumonia with ARDS complicated by MSSA PNA. Failure to wean s/p tracheostomy Tracheostomy placed 10/18 P: Patient has tolerated transition to trach collar well Routine trach care Encourage pulmonary hygiene As needed bronchodilators  Necrotizing infection of soft tissue surrounding sacral wound -Patient began displaying evidence of sepsis with shock physiology 11/1 prompting her evaluation including CT scan that revealed gas surrounding soft tissue prompting surgical consult. Wound cultures positive for E. Coli and E. Faecalis. P: Patient has been undergoing hydrotherapy for treatment of significant decubitus/surgical wound, hold today PT/OT Frequent turning repositioning  ESRD -On Tuesday Thursday  Saturday scheduling prior to admission but since resumption of the HD during admission she has been on Monday Wednesday Friday scheduling P: Nephrology following, appreciate assistance Given hypertension, active bleed May need to hold dialysis today Follow renal function Trend Bmet Enteral hydration  Acute metabolic encephalopathy in setting of sepsis Appears improved, she is able to follow simple commands and interact  P: RASS goal 0 Continue Klonopin, oxycodone, and Seroquel Encourage sleep-wake cycle  Hypothyroidism P: Continue home Synthroid  DM type 2 with labile blood sugars. P: Continue SSI, Levemir, and tube feed coverage Check CBGs every 4 hours  Dysphagia. P: Core track in place, continue tube feeds Patient will need PEG tube placement once stabilized  Pressure ulcer -Unstageable sacral ulcer stage II right neck presure injury  P: Wound care Pressure alleviating devices Frequent turns   Best practice:  Diet: tube feeding DVT prophylaxis: Heparin [place] GI prophylaxis: Protonix Mobility: Bed rest Code Status: DNR Family discussion: as able Disposition: ICU  Labs:   CMP Latest Ref Rng & Units 04/16/2020 04/15/2020 04/14/2020  Glucose 70 - 99 mg/dL 226(H) 148(H) 158(H)  BUN 6 - 20 mg/dL 101(H) 80(H) 45(H)  Creatinine 0.44 - 1.00 mg/dL 5.60(H) 4.49(H) 3.42(H)  Sodium 135 - 145 mmol/L 130(L) 132(L) 135  Potassium 3.5 - 5.1 mmol/L 5.8(H) 4.1 4.0  Chloride 98 - 111 mmol/L 95(L) 96(L) 98  CO2 22 - 32 mmol/L 17(L) 21(L) 23  Calcium 8.9 - 10.3 mg/dL 9.0 9.1  9.4  Total Protein 6.5 - 8.1 g/dL - - -  Total Bilirubin 0.3 - 1.2 mg/dL - - -  Alkaline Phos 38 - 126 U/L - - -  AST 15 - 41 U/L - - -  ALT 0 - 44 U/L - - -    CBC Latest Ref Rng & Units 04/16/2020 04/15/2020 04/15/2020  WBC 4.0 - 10.5 K/uL 20.9(H) 15.6(H) 13.8(H)  Hemoglobin 12.0 - 15.0 g/dL 7.9(L) 7.3(L) 8.1(L)  Hematocrit 36 - 46 % 24.0(L) 22.8(L) 25.1(L)  Platelets 150 - 400 K/uL 396 423(H)  398    ABG    Component Value Date/Time   PHART 7.250 (L) 04/03/2020 1030   PCO2ART 64.8 (H) 04/03/2020 1030   PO2ART 156 (H) 04/03/2020 1030   HCO3 28.4 (H) 04/03/2020 1030   TCO2 30 04/03/2020 1030   ACIDBASEDEF 2.0 04/03/2020 0907   O2SAT 99.0 04/03/2020 1030    CBG (last 3)  Recent Labs    04/15/20 1939 04/16/20 0321 04/16/20 0724  GLUCAP 111* 197* 152*   CRITICAL CARE Performed by: Johnsie Cancel  Total critical care time: 42 minutes  Critical care time was exclusive of separately billable procedures and treating other patients.  Critical care was necessary to treat or prevent imminent or life-threatening deterioration.  Critical care was time spent personally by me on the following activities: development of treatment plan with patient and/or surrogate as well as nursing, discussions with consultants, evaluation of patient's response to treatment, examination of patient, obtaining history from patient or surrogate, ordering and performing treatments and interventions, ordering and review of laboratory studies, ordering and review of radiographic studies, pulse oximetry and re-evaluation of patient's condition.  Johnsie Cancel, NP-C [facility] Contact / Pager information can be found on Amion  04/16/2020, 9:30 AM

## 2020-04-16 NOTE — Progress Notes (Addendum)
[facility] Progress Note   OP HD:TTS (running MWF here now) 4h 31mn 450/800 2/2.25 bath 111.5kg Hep 2000 L AVF - darbe 50 ug q week, last 9/7 - calc 1.0 tiw - 9/9 Hb 10.0, tsat 22%  49yo female w/ ESRD on TTS HD admitted for COVID PNA on 02/13/20. She was intubated. SP CRRT around 9/26- 10/7. Now is on intermittent HD MWF here. She is sp trach. She had MSSA pna. DM2.  Assessment/ Plan:   1. COVID pna /sp trach 2. Hypotension - recurrent issues, back on pressors and back in ICU as of this am.   3. Sacral decub w/ necrosis - sp I&D 11/2 and again 11/5 by gen surg. Surgery following again and planning hydrotherapy 1st and may need another debridement.  Currently on merrem. 4. S/P MSSA HCAP/ VAP 5. ESRD -usual HD TTS at outpt unit but protracted admission so has been running MWF recently.sp CRRT 9/26- 10/7. HD today in ICU.  4. BP/ volume - on midodrine 10 tid. Some dependent hip edema bilat. Wt's improving. UF w/ HD as tolerated, low goal today w/ low BP's  5. HD access: LUA AVF clotted while here. TDC x2 here by IR.LIJ TC 6. Anemia ckd/ critical illness - on darbe 150 weekly, 11/12 Ferritin >4000, iron sat 11.  With active infection and ^^ ferritin no IV iron. tx prbc's PRN, not currently indicated with Hb 8s.  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 Phos 3.9 10/30 > 9.2 11/8 --> 6.5.  RD switched to nepro and lowered FWF on 11/12. Follow 8. DM2 - per pmd  RKelly Splinter MD 04/16/2020, 3:13 PM    Subjective:   Had bleeder tied in the sacral wound last night.  Back on shock, moved to ICU this  Am and back on pressor support.    Objective:   BP 116/67   Pulse (!) 109   Temp 98.6 F (37 C) (Axillary)   Resp 18   Ht '5\' 2"'  (1.575 m)   Wt 102.8 kg   SpO2 96%   BMI 41.45 kg/m   Intake/Output Summary (Last 24 hours) at 04/16/2020 1512 Last data filed at 04/16/2020 1222 Gross per 24 hour  Intake 955.04 ml  Output --  Net 955.04  ml   Weight change:   Physical Exam: Awake, will respond minimally to conversation , +trach collar ,wiggles toes Lungs - occ cough, occ rhonchi, no wheezing CVS-tachy ABD- BS present soft non-distended  EXT- trace pedal edema, lt arm access thrombosed ACCESS: LIJ TC  Imaging: No results found.  Labs: BMET Recent Labs  Lab 04/10/20 0518 04/11/20 1130 04/12/20 0520 04/13/20 0500 04/14/20 0719 04/15/20 0449 04/16/20 0400  NA 141 143 140 138 135 132* 130*  K 4.2 5.4* 4.1 4.8 4.0 4.1 5.8*  CL 102 105 99 98 98 96* 95*  CO2 '22 23 25 22 23 ' 21* 17*  GLUCOSE 257* 244* 143* 140* 158* 148* 226*  BUN 42* 69* 36* 60* 45* 80* 101*  CREATININE 3.45* 5.12* 3.18* 4.55* 3.42* 4.49* 5.60*  CALCIUM 9.2 9.3 9.2 9.4 9.4 9.1 9.0  PHOS  --   --  6.5*  --  7.3* 8.9* 11.1*   CBC Recent Labs  Lab 04/11/20 1130 04/11/20 1130 04/12/20 0520 04/13/20 0500 04/14/20 0719 04/15/20 0449 04/15/20 2030 04/16/20 0400  WBC 18.6*   < > 18.0*   < > 15.6* 13.8* 15.6* 20.9*  NEUTROABS 15.1*  --  14.6*  --   --   --   --   --   HGB 8.2*   < > 8.4*   < > 8.6* 8.1* 7.3* 7.9*  HCT 26.1*   < > 26.7*   < > 26.9* 25.1* 22.8* 24.0*  MCV 90.9   < > 91.4   < > 90.9 89.6 91.6 88.9  PLT 405*   < > 427*   < > 430* 398 423* 396   < > = values in this interval not displayed.    Medications:    . sodium chloride   Intravenous Once  . sodium chloride   Intravenous Once  . sodium chloride   Intravenous Once  . B-complex with vitamin C  1 tablet Per Tube Daily  . chlorhexidine gluconate (MEDLINE KIT)  15 mL Mouth Rinse BID  . Chlorhexidine Gluconate Cloth  6 each Topical Q0600  . Chlorhexidine Gluconate Cloth  6 each Topical Q0600  . clonazepam  0.5 mg Per Tube TID  . darbepoetin (ARANESP) injection - DIALYSIS  150 mcg Intravenous Q Wed-HD  . feeding supplement (PROSource TF)  45 mL Per Tube TID  . free water  20 mL Per Tube Q6H  . hydrocerin   Topical Daily  . insulin aspart  0-20 Units Subcutaneous Q4H   . insulin aspart  7 Units Subcutaneous Q4H  . insulin detemir  30 Units Subcutaneous BID  . levothyroxine  125 mcg Per Tube Q0600  . mouth rinse  15 mL Mouth Rinse 10 times per day  . midodrine  10 mg Per Tube TID WC  . nutrition supplement (JUVEN)  1 packet Per Tube BID  . oxyCODONE  10 mg Per Tube Q6H  . pantoprazole sodium  40 mg Per Tube BID  . polyethylene glycol  17 g Per Tube Daily  . QUEtiapine  100 mg Per Tube BID  . sodium chloride flush  10-40 mL Intracatheter Q12H  . sodium zirconium cyclosilicate  10 g Per Tube TID  . Thrombi-Pad  1 each Topical Once

## 2020-04-17 DIAGNOSIS — L03317 Cellulitis of buttock: Secondary | ICD-10-CM | POA: Diagnosis not present

## 2020-04-17 DIAGNOSIS — U071 COVID-19: Secondary | ICD-10-CM | POA: Diagnosis not present

## 2020-04-17 DIAGNOSIS — S31000A Unspecified open wound of lower back and pelvis without penetration into retroperitoneum, initial encounter: Secondary | ICD-10-CM

## 2020-04-17 DIAGNOSIS — J9601 Acute respiratory failure with hypoxia: Secondary | ICD-10-CM | POA: Diagnosis not present

## 2020-04-17 DIAGNOSIS — E1169 Type 2 diabetes mellitus with other specified complication: Secondary | ICD-10-CM | POA: Diagnosis not present

## 2020-04-17 LAB — TYPE AND SCREEN
ABO/RH(D): O POS
Antibody Screen: NEGATIVE
Unit division: 0
Unit division: 0
Unit division: 0

## 2020-04-17 LAB — BPAM RBC
Blood Product Expiration Date: 202112132359
Blood Product Expiration Date: 202112172359
Blood Product Expiration Date: 202112182359
ISSUE DATE / TIME: 202111142351
ISSUE DATE / TIME: 202111150603
ISSUE DATE / TIME: 202111160112
Unit Type and Rh: 5100
Unit Type and Rh: 5100
Unit Type and Rh: 5100

## 2020-04-17 LAB — RENAL FUNCTION PANEL
Albumin: 2.3 g/dL — ABNORMAL LOW (ref 3.5–5.0)
Anion gap: 13 (ref 5–15)
BUN: 40 mg/dL — ABNORMAL HIGH (ref 6–20)
CO2: 26 mmol/L (ref 22–32)
Calcium: 9.3 mg/dL (ref 8.9–10.3)
Chloride: 96 mmol/L — ABNORMAL LOW (ref 98–111)
Creatinine, Ser: 3.02 mg/dL — ABNORMAL HIGH (ref 0.44–1.00)
GFR, Estimated: 18 mL/min — ABNORMAL LOW
Glucose, Bld: 120 mg/dL — ABNORMAL HIGH (ref 70–99)
Phosphorus: 5.9 mg/dL — ABNORMAL HIGH (ref 2.5–4.6)
Potassium: 3 mmol/L — ABNORMAL LOW (ref 3.5–5.1)
Sodium: 135 mmol/L (ref 135–145)

## 2020-04-17 LAB — CULTURE, BLOOD (ROUTINE X 2)
Culture: NO GROWTH
Culture: NO GROWTH
Special Requests: ADEQUATE
Special Requests: ADEQUATE

## 2020-04-17 LAB — CBC
HCT: 24.2 % — ABNORMAL LOW (ref 36.0–46.0)
Hemoglobin: 8.1 g/dL — ABNORMAL LOW (ref 12.0–15.0)
MCH: 30.2 pg (ref 26.0–34.0)
MCHC: 33.5 g/dL (ref 30.0–36.0)
MCV: 90.3 fL (ref 80.0–100.0)
Platelets: 264 10*3/uL (ref 150–400)
RBC: 2.68 MIL/uL — ABNORMAL LOW (ref 3.87–5.11)
RDW: 14.6 % (ref 11.5–15.5)
WBC: 10.5 10*3/uL (ref 4.0–10.5)
nRBC: 0 % (ref 0.0–0.2)

## 2020-04-17 LAB — GLUCOSE, CAPILLARY
Glucose-Capillary: 117 mg/dL — ABNORMAL HIGH (ref 70–99)
Glucose-Capillary: 117 mg/dL — ABNORMAL HIGH (ref 70–99)
Glucose-Capillary: 118 mg/dL — ABNORMAL HIGH (ref 70–99)
Glucose-Capillary: 157 mg/dL — ABNORMAL HIGH (ref 70–99)
Glucose-Capillary: 226 mg/dL — ABNORMAL HIGH (ref 70–99)
Glucose-Capillary: 88 mg/dL (ref 70–99)

## 2020-04-17 MED ORDER — ORAL CARE MOUTH RINSE
15.0000 mL | OROMUCOSAL | Status: AC
  Administered 2020-04-17: 15 mL via OROMUCOSAL

## 2020-04-17 MED ORDER — POLYETHYLENE GLYCOL 3350 17 G PO PACK: 17.0000 g | PACK | Freq: Every day | ORAL | Status: DC | PRN

## 2020-04-17 MED ORDER — POTASSIUM CHLORIDE 20 MEQ PO PACK
40.0000 meq | PACK | Freq: Two times a day (BID) | ORAL | Status: AC
  Administered 2020-04-17: 40 meq via ORAL
  Filled 2020-04-17: qty 2

## 2020-04-17 MED ORDER — SILVER NITRATE-POT NITRATE 75-25 % EX MISC
1.0000 "application " | CUTANEOUS | Status: DC | PRN
  Filled 2020-04-17: qty 1

## 2020-04-17 MED ORDER — POLYETHYLENE GLYCOL 3350 17 G PO PACK
17.0000 g | PACK | Freq: Every day | ORAL | Status: DC | PRN
  Administered 2020-04-22 (×2): 17 g
  Filled 2020-04-17 (×2): qty 1

## 2020-04-17 MED ORDER — POTASSIUM CHLORIDE 20 MEQ PO PACK
20.0000 meq | PACK | Freq: Two times a day (BID) | ORAL | Status: DC
  Administered 2020-04-17: 20 meq via ORAL
  Filled 2020-04-17: qty 1

## 2020-04-17 NOTE — Progress Notes (Signed)
Progress Note  14 Days Post-Op  Subjective: Seen for dressing change with RN and resident. Patient pre-medicated although still had significant pain with dressing change.  Objective: Vital signs in last 24 hours: Temp:  [98.1 F (36.7 C)-99.3 F (37.4 C)] 98.1 F (36.7 C) (11/16 0817) Pulse Rate:  [93-114] 110 (11/16 0900) Resp:  [13-24] 22 (11/16 0900) BP: (82-138)/(31-87) 138/87 (11/16 0900) SpO2:  [94 %-100 %] 98 % (11/16 0900) FiO2 (%):  [28 %] 28 % (11/16 0804) Weight:  [102.8 kg] 102.8 kg (11/15 1630) Last BM Date: 04/16/20  Intake/Output from previous day: 11/15 0701 - 11/16 0700 In: 2271.3 [I.V.:250.5; Blood:812; NG/GT:605; IV Piggyback:603.8] Out: 2000  Intake/Output this shift: Total I/O In: 83 [IV Piggyback:83] Out: -   PE: Gen: Awake, NAD Pulm: Rate and effort normal  Wounds: Large sacral/left buttocks wound. Exposed sacrum in wound base with fibrinous exudate present. Healthy granulation tissue stable. Minor bleeding from a vessel in inferior flap which was controlled with direct pressure, old hematoma evacuated, no purulence noted at this time    Lab Results:  Recent Labs    04/16/20 1556 04/17/20 0744  WBC 10.9* 10.5  HGB 7.0* 8.1*  HCT 20.8* 24.2*  PLT 289 264   BMET Recent Labs    04/16/20 0400 04/17/20 0744  NA 130* 135  K 5.8* 3.0*  CL 95* 96*  CO2 17* 26  GLUCOSE 226* 120*  BUN 101* 40*  CREATININE 5.60* 3.02*  CALCIUM 9.0 9.3   PT/INR Recent Labs    04/16/20 1730  LABPROT 13.1  INR 1.0   CMP     Component Value Date/Time   NA 135 04/17/2020 0744   K 3.0 (L) 04/17/2020 0744   CL 96 (L) 04/17/2020 0744   CO2 26 04/17/2020 0744   GLUCOSE 120 (H) 04/17/2020 0744   BUN 40 (H) 04/17/2020 0744   CREATININE 3.02 (H) 04/17/2020 0744   CALCIUM 9.3 04/17/2020 0744   PROT 5.9 (L) 04/13/2020 0500   ALBUMIN 2.3 (L) 04/17/2020 0744   AST 25 04/13/2020 0500   ALT 25 04/13/2020 0500   ALKPHOS 316 (H) 04/13/2020 0500   BILITOT  0.8 04/13/2020 0500   GFRNONAA 18 (L) 04/17/2020 0744   GFRAA 33 (L) 03/05/2020 1703   Lipase  No results found for: LIPASE     Studies/Results: No results found.  Anti-infectives: Anti-infectives (From admission, onward)   Start     Dose/Rate Route Frequency Ordered Stop   04/12/20 1800  meropenem (MERREM) 500 mg in sodium chloride 0.9 % 100 mL IVPB  Status:  Discontinued        500 mg 200 mL/hr over 30 Minutes Intravenous Every 24 hours 04/12/20 1746 04/14/20 1426   04/06/20 1200  vancomycin (VANCOCIN) IVPB 1000 mg/200 mL premix        1,000 mg 200 mL/hr over 60 Minutes Intravenous Every Fri (Hemodialysis) 04/05/20 1529 04/06/20 1300   04/04/20 1200  vancomycin (VANCOCIN) IVPB 1000 mg/200 mL premix        1,000 mg 200 mL/hr over 60 Minutes Intravenous Every M-W-F (Hemodialysis) 04/04/20 1014 04/04/20 1423   04/03/20 0000  piperacillin-tazobactam (ZOSYN) IVPB 2.25 g  Status:  Discontinued        2.25 g 100 mL/hr over 30 Minutes Intravenous Every 8 hours 04/02/20 2253 04/09/20 1559   04/02/20 2345  vancomycin (VANCOCIN) IVPB 1000 mg/200 mL premix        1,000 mg 200 mL/hr over 60 Minutes Intravenous  Once 04/02/20 2251 04/03/20 0236   04/02/20 2251  vancomycin variable dose per unstable renal function (pharmacist dosing)  Status:  Discontinued         Does not apply See admin instructions 04/02/20 2251 04/07/20 1037   04/02/20 1200  ceFAZolin (ANCEF) IVPB 1 g/50 mL premix  Status:  Discontinued        1 g 100 mL/hr over 30 Minutes Intravenous Every 24 hours 04/02/20 1030 04/02/20 2251   04/01/20 1300  vancomycin (VANCOCIN) 2,000 mg in sodium chloride 0.9 % 500 mL IVPB  Status:  Discontinued        2,000 mg 250 mL/hr over 120 Minutes Intravenous  Once 04/01/20 1203 04/01/20 1203   04/01/20 1300  vancomycin (VANCOREADY) IVPB 2000 mg/400 mL        2,000 mg 200 mL/hr over 120 Minutes Intravenous Every 12 hours 04/01/20 1205 04/01/20 1715   04/01/20 1300  vancomycin (VANCOCIN)  2,000 mg in sodium chloride 0.9 % 500 mL IVPB  Status:  Discontinued        2,000 mg 250 mL/hr over 120 Minutes Intravenous  Once 04/01/20 1207 04/01/20 1208   04/01/20 1245  vancomycin (VANCOCIN) 2,000 mg in sodium chloride 0.9 % 500 mL IVPB  Status:  Discontinued        2,000 mg 250 mL/hr over 120 Minutes Intravenous  Once 04/01/20 1154 04/01/20 1206   04/01/20 1154  vancomycin variable dose per unstable renal function (pharmacist dosing)  Status:  Discontinued         Does not apply See admin instructions 04/01/20 1154 04/02/20 1030   03/11/20 1200  cefTRIAXone (ROCEPHIN) 2 g in sodium chloride 0.9 % 100 mL IVPB        2 g 200 mL/hr over 30 Minutes Intravenous Every 24 hours 03/11/20 1107 03/17/20 1307   03/10/20 1200  piperacillin-tazobactam (ZOSYN) IVPB 3.375 g  Status:  Discontinued        3.375 g 12.5 mL/hr over 240 Minutes Intravenous Every 12 hours 03/10/20 1117 03/10/20 1245   03/10/20 1130  vancomycin (VANCOCIN) IVPB 1000 mg/200 mL premix        1,000 mg 200 mL/hr over 60 Minutes Intravenous  Once 03/10/20 1117 03/10/20 1258   03/10/20 1000  vancomycin variable dose per unstable renal function (pharmacist dosing)  Status:  Discontinued         Does not apply See admin instructions 03/09/20 1412 03/14/20 0810   03/09/20 2300  piperacillin-tazobactam (ZOSYN) IVPB 2.25 g  Status:  Discontinued        2.25 g 100 mL/hr over 30 Minutes Intravenous Every 8 hours 03/09/20 1412 03/10/20 1117   03/09/20 1415  piperacillin-tazobactam (ZOSYN) IVPB 3.375 g        3.375 g 100 mL/hr over 30 Minutes Intravenous  Once 03/09/20 1412 03/09/20 1537   03/09/20 1415  vancomycin (VANCOCIN) 2,250 mg in sodium chloride 0.9 % 500 mL IVPB        2,250 mg 250 mL/hr over 120 Minutes Intravenous  Once 03/09/20 1412 03/09/20 1639   02/28/20 2200  ceFEPIme (MAXIPIME) 1 g in sodium chloride 0.9 % 100 mL IVPB        1 g 200 mL/hr over 30 Minutes Intravenous Every 12 hours 02/28/20 1305 02/29/20 2220    02/28/20 0907  ceFEPIme (MAXIPIME) 1 g in sodium chloride 0.9 % 100 mL IVPB  Status:  Discontinued        1 g 200 mL/hr over 30 Minutes Intravenous  Every 24 hours 02/27/20 0939 02/28/20 1305   02/26/20 2200  ceFEPIme (MAXIPIME) 2 g in sodium chloride 0.9 % 100 mL IVPB  Status:  Discontinued        2 g 200 mL/hr over 30 Minutes Intravenous Every 12 hours 02/26/20 1519 02/27/20 0939   02/25/20 1200  ceFEPIme (MAXIPIME) 2 g in sodium chloride 0.9 % 100 mL IVPB  Status:  Discontinued        2 g 200 mL/hr over 30 Minutes Intravenous Every T-Th-Sa (Hemodialysis) 02/25/20 0955 02/26/20 1519   02/24/20 1904  azithromycin (ZITHROMAX) 500 mg in sodium chloride 0.9 % 250 mL IVPB  Status:  Discontinued        500 mg 250 mL/hr over 60 Minutes Intravenous Every 24 hours 02/24/20 1904 02/26/20 0815   02/24/20 1900  azithromycin (ZITHROMAX) 250 mg in dextrose 5 % 125 mL IVPB  Status:  Discontinued        250 mg 125 mL/hr over 60 Minutes Intravenous Every 24 hours 02/24/20 1845 02/24/20 1904   02/24/20 1800  ceFEPIme (MAXIPIME) 2 g in sodium chloride 0.9 % 100 mL IVPB  Status:  Discontinued        2 g 200 mL/hr over 30 Minutes Intravenous Every Fri (Hemodialysis) 02/24/20 1326 02/25/20 0942   02/22/20 1745  ceFAZolin (ANCEF) IVPB 2g/100 mL premix        2 g 200 mL/hr over 30 Minutes Intravenous  Once 02/22/20 1744 02/22/20 1931   02/22/20 0600  ceFAZolin (ANCEF) IVPB 2g/100 mL premix        2 g 200 mL/hr over 30 Minutes Intravenous To Radiology 02/21/20 1658 02/22/20 1900   02/22/20 0000  ceFAZolin (ANCEF) IVPB 1 g/50 mL premix  Status:  Discontinued        1 g 100 mL/hr over 30 Minutes Intravenous To Radiology 02/21/20 1645 02/21/20 1658   02/21/20 1800  azithromycin (ZITHROMAX) tablet 250 mg  Status:  Discontinued       "Followed by" Linked Group Details   250 mg Oral Daily-1800 02/20/20 1658 02/24/20 1852   02/21/20 1800  ceFEPIme (MAXIPIME) 2 g in sodium chloride 0.9 % 100 mL IVPB  Status:   Discontinued        2 g 200 mL/hr over 30 Minutes Intravenous Every T-Th-Sa (1800) 02/20/20 1737 02/24/20 1326   02/20/20 1800  azithromycin (ZITHROMAX) tablet 500 mg       "Followed by" Linked Group Details   500 mg Oral Daily 02/20/20 1658 02/20/20 1731   02/20/20 1745  ceFEPIme (MAXIPIME) 1 g in sodium chloride 0.9 % 100 mL IVPB        1 g 200 mL/hr over 30 Minutes Intravenous STAT 02/20/20 1737 02/21/20 0747   02/19/20 1145  remdesivir 100 mg in sodium chloride 0.9 % 100 mL IVPB        100 mg 200 mL/hr over 30 Minutes Intravenous Daily 02/19/20 1131 02/19/20 1443   02/15/20 1000  remdesivir 100 mg in sodium chloride 0.9 % 100 mL IVPB  Status:  Discontinued       "Followed by" Linked Group Details   100 mg 200 mL/hr over 30 Minutes Intravenous Daily 02/14/20 0944 02/14/20 0951   02/15/20 1000  remdesivir 100 mg in sodium chloride 0.9 % 100 mL IVPB        100 mg 200 mL/hr over 30 Minutes Intravenous Daily 02/14/20 0952 02/17/20 1102   02/14/20 1030  remdesivir 100 mg in sodium chloride 0.9 %  100 mL IVPB        100 mg 200 mL/hr over 30 Minutes Intravenous Every 30 min 02/14/20 3967 02/14/20 1129   02/14/20 0945  remdesivir 200 mg in sodium chloride 0.9% 250 mL IVPB  Status:  Discontinued       "Followed by" Linked Group Details   200 mg 580 mL/hr over 30 Minutes Intravenous Once 02/14/20 0944 02/14/20 0951       Assessment/Plan OSA Morbid obesityBMI 44 Hypothyroidism HTN GERD ESRDon HD DM Asthma Chronic respiratory failure 2/2 Covid-19 (diagnosed 02/09/20) S/p tracheostomy Code status DNR  Necrotizing soft tissue infection S/pincision and debridement of sacrum, left gluteus11/2 Dr. Bobbye Morton -POD #14 - Culture: E COLI and ENTEROCOCCUS FAECALIS. Currently on Merrem  - Hydrotherapy on hold for bleeding - 1 vessel sutured around 1200 11/15 - minor bleeding from 1 spot today which was controlled with pressure - will plan to resume hydrotherapy tomorrow and I will see  wound again at that time - no acute surgical intervention planned at this time - also may need to consider diverting colostomy at some point - will await results of palliative discussions today before broaching this subject with family    LOS: 60 days    Norm Parcel , [facility] 04/17/2020, 9:46 AM Please see Amion for pager number during day hours 7:00am-4:30pm

## 2020-04-17 NOTE — Progress Notes (Signed)
[facility] Progress Note   OP HD:MWF here (was TTS at OP unit) 4h 38mn 450/800 2/2.25 bath 111.5kg Hep 2000 L AVF - darbe 50 ug q week, last 9/7 - calc 1.0 tiw - 9/9 Hb 10.0, tsat 22%  49yo female w/ ESRD on TTS HD admitted for COVID PNA on 02/13/20. She was intubated. SP CRRT around 9/26- 10/7. Now is on intermittent HD MWF here. She is sp trach. She had MSSA pna. DM2.Then she had large sacral decub requiring I&D.   Assessment/ Plan:   1. COVID pna /sp trach 2. Hypotension/ vol - recurrent issue, was sent back to ICU and put on pressors yest but is off pressors today. Wt's are stable 102- 108kg, minimal hip edema on exam.  3. Sacral decub w/ necrosis - sp I&D 11/2 and again 11/5 by gen surg. Surgery following again and planning hydrotherapy 1st and may need another debridement.  Currently on merrem. 4. ESRD -getting HD MWF here now.sp CRRT 9/26- 10/7. HD Wed.  4. HD access: LUA AVF clotted while here. TDC x2 here by IR.LIJ TC 5. Anemia ckd/ critical illness - on darbe 150 weekly, 11/12 Ferritin >4000, iron sat 11.  With active infection and ^^ ferritin no IV iron. tx prbc's PRN 6. 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 Phos 3.9 10/30 > 9.2 11/8 --> 6.5.  RD switched to nepro  7. S/P MSSA HCAP/ VAP 8. DM2 - per pmd  RKelly Splinter MD 04/17/2020, 1:52 PM    Subjective:   Had bleeder tied in the sacral wound last night.  Back on shock, moved to ICU this  Am and back on pressor support.    Objective:   BP 123/77   Pulse (!) 103   Temp 98.8 F (37.1 C) (Axillary)   Resp (!) 22   Ht '5\' 2"'  (1.575 m)   Wt 102.8 kg   SpO2 97%   BMI 41.45 kg/m   Intake/Output Summary (Last 24 hours) at 04/17/2020 1352 Last data filed at 04/17/2020 1300 Gross per 24 hour  Intake 1847.39 ml  Output 2000 ml  Net -152.61 ml   Weight change:   Physical Exam: Awake, will respond minimally to conversation , +trach collar ,wiggles toes Lungs - occ  cough, occ rhonchi, no wheezing CVS-tachy ABD- BS present soft non-distended  EXT- trace pedal edema, lt arm access thrombosed ACCESS: LIJ TC  Imaging: No results found.  Labs: BMET Recent Labs  Lab 04/11/20 1130 04/12/20 0520 04/13/20 0500 04/14/20 0719 04/15/20 0449 04/16/20 0400 04/17/20 0744  NA 143 140 138 135 132* 130* 135  K 5.4* 4.1 4.8 4.0 4.1 5.8* 3.0*  CL 105 99 98 98 96* 95* 96*  CO2 '23 25 22 23 ' 21* 17* 26  GLUCOSE 244* 143* 140* 158* 148* 226* 120*  BUN 69* 36* 60* 45* 80* 101* 40*  CREATININE 5.12* 3.18* 4.55* 3.42* 4.49* 5.60* 3.02*  CALCIUM 9.3 9.2 9.4 9.4 9.1 9.0 9.3  PHOS  --  6.5*  --  7.3* 8.9* 11.1* 5.9*   CBC Recent Labs  Lab 04/11/20 1130 04/11/20 1130 04/12/20 0520 04/13/20 0500 04/15/20 2030 04/16/20 0400 04/16/20 1556 04/17/20 0744  WBC 18.6*   < > 18.0*   < > 15.6* 20.9* 10.9* 10.5  NEUTROABS 15.1*  --  14.6*  --   --   --   --   --   HGB 8.2*   < > 8.4*   < >  7.3* 7.9* 7.0* 8.1*  HCT 26.1*   < > 26.7*   < > 22.8* 24.0* 20.8* 24.2*  MCV 90.9   < > 91.4   < > 91.6 88.9 87.0 90.3  PLT 405*   < > 427*   < > 423* 396 289 264   < > = values in this interval not displayed.    Medications:    . sodium chloride   Intravenous Once  . sodium chloride   Intravenous Once  . sodium chloride   Intravenous Once  . sodium chloride   Intravenous Once  . B-complex with vitamin C  1 tablet Per Tube Daily  . chlorhexidine gluconate (MEDLINE KIT)  15 mL Mouth Rinse BID  . Chlorhexidine Gluconate Cloth  6 each Topical Q0600  . Chlorhexidine Gluconate Cloth  6 each Topical Q0600  . clonazepam  0.5 mg Per Tube TID  . darbepoetin (ARANESP) injection - DIALYSIS  150 mcg Intravenous Q Wed-HD  . feeding supplement (PROSource TF)  45 mL Per Tube TID  . free water  20 mL Per Tube Q6H  . hydrocerin   Topical Daily  . insulin aspart  0-20 Units Subcutaneous Q4H  . insulin aspart  7 Units Subcutaneous Q4H  . insulin detemir  30 Units Subcutaneous BID  .  levothyroxine  125 mcg Per Tube Q0600  . midodrine  10 mg Per Tube TID WC  . nutrition supplement (JUVEN)  1 packet Per Tube BID  . oxyCODONE  10 mg Per Tube Q6H  . pantoprazole sodium  40 mg Per Tube BID  . potassium chloride  40 mEq Oral BID  . QUEtiapine  100 mg Per Tube BID  . sodium chloride flush  10-40 mL Intracatheter Q12H  . Thrombi-Pad  1 each Topical Once

## 2020-04-17 NOTE — Progress Notes (Signed)
PT Cancellation Note  Patient Details Name: Wanda Ramirez MRN: [id number] DOB: 1971/03/07   Cancelled Treatment:    Reason Eval/Treat Not Completed: Medical issues which prohibited therapy. Discussed pt case with Claiborne Billings, PA-C and will hold hydrotherapy this date 2 bleeding. Will continue to follow and resume hydrotherapy when surgical team clears pt for treatment.    [name] 04/17/2020, 9:53 AM   Rolinda Roan, PT, DPT Acute Rehabilitation Services Pager: [phone] Office: [phone]

## 2020-04-17 NOTE — Progress Notes (Signed)
Daily Progress Note   Patient Name: Wanda Ramirez       Date: 04/17/2020 DOB: 01-27-1971  Age: 49 y.o. MRN#: [id number] Attending Physician: Candee Furbish, MD Primary Care Physician: Welford Roche, NP Admit Date: 02/13/2020  Reason for Consultation/Follow-up:   To discuss complex medical decision making related to patient's goals of care  Reviewed Epic.  Visited patient at bedside.  Discussed with ICU RN.  Subjective: PMT & Renal Navigator joined family for 60 minute conference call with Wanda Ramirez, and Wanda Ramirez.  Conversation quickly went to questions from Wanda Ramirez about bruising on patient's arm - how and when did it come about?  Wanda Ramirez expressed frustration that each time she asks a question she is told "today is my 1st day working with the patient, I don't know the answer but I will find out and get back to you".  She felt similarly about the patient's wound.  Many questions to which she feels as though she is not getting answers.  Many frustrations and concerns were expressed.  Wanda Ramirez questioned when the wound was found, why was it not brought to the family's attention earlier, why was the decision made to cancel the second surgery, If she was too weak for the second surgery then why did she have the first surgery.   Wanda Ramirez wants to support Wanda Ramirez but also wants to protect Wanda Ramirez and Wanda Ramirez from this very difficult situation.  Attempted to answer questions to the best of my ability.  Encouraged family to talk about how we can move forward together.   Proposed tighter communication.  Wanda Ramirez calls and speaks to the RNs twice a day - but she needs information from the MDs directly.  She needs to understand that issues and the plans for her mother.  Expressed concern that the wound may not  heal.  Discussed that patient would not be able to sit for hemodialysis and would require dialysis lying down in a facility.     Discussed the things we are doing for Wanda Ramirez now including antibiotics, hemodialysis, artificial feeding with protein supplementation, hydrotherapy, respiratory, speech, and physical therapy.  Assessment: Patient awake, alert, appears hemodynamically stable.   Severe wound (the majority of her bottom is gone), malnutrition, on trach collar with 5L, severe deconditioning.  On antibiotics for necrotizing infection that caused the  wound.   Patient Profile/HPI:  49 y.o. female  with past medical history of ESRD, DM, OSA, chronic back pain, asthma, arthritis and morbid obesity who was admitted on 02/13/2020 with COVID 19.  She has had a long hospital course including intubation and reintubation, tracheostomy, large unstageable sacral wound with necrotizing soft tissue infection status post OR debridement x 2.  Last night (11/14) she suffered arterial hemorrhage from her wound and was transfused.  On 11/16 she is off pressors and moving out of the ICU.   Length of Stay: 63   Vital Signs: BP 122/77   Pulse (!) 111   Temp 98.7 F (37.1 C) (Axillary)   Resp 20   Ht 5\' 2"  (1.575 m)   Wt 102.8 kg   SpO2 98%   BMI 41.45 kg/m  SpO2: SpO2: 98 % O2 Device: O2 Device: Tracheostomy Collar O2 Flow Rate: O2 Flow Rate (L/min): 5 L/min       Palliative Assessment/Data: 20%     Palliative Care Plan    Recommendations/Plan: Family needs increased communication from the MDs.  They are very concerned and feel uninformed. Palliative will support those efforts as well. Full scope treatment  Very concerned about disposition options.  She will not be able to sit for hemodialysis.  Code Status:  DNR  Prognosis:  Unable to determine Very fragile and at high risk for acute decompensation.  Discharge Planning: To Be Determined  Care plan was discussed with ICU RN.   Renall Navigator.  Thank you for allowing the Palliative Medicine Team to assist in the care of this patient.  Total time spent:  90 min.     Greater than 50%  of this time was spent counseling and coordinating care related to the above assessment and plan.  Florentina Jenny, PA-C Palliative Medicine  Please contact Palliative MedicineTeam phone at [phone] for questions and concerns between 7 am - 7 pm.   Please see AMION for individual provider pager numbers.

## 2020-04-17 NOTE — Progress Notes (Signed)
Assisted tele visit to patient with daughter.  Margaret Pyle, RN

## 2020-04-17 NOTE — Care Plan (Signed)
Received message from [place] regarding Wanda Ramirez's plan to transfer out of the ICU. IMTS service to take over care 04/18/2020 at 7am.

## 2020-04-17 NOTE — Progress Notes (Addendum)
04/17/2020 I saw and evaluated the patient. Discussed with resident and agree with resident's findings and plan as documented in the resident's note.  I have seen and evaluated the patient for shock.  S:  Off pressors. Denies pain. Remains on TC.  O: Blood pressure 138/87, pulse (!) 110, temperature 98.1 F (36.7 C), temperature source Oral, resp. rate (!) 22, height 5\' 2"  (1.575 m), weight 102.8 kg, SpO2 98 %.  Chronically ill obese woman on TC Trach in place with small secretions Scattered rhonci on exam, shallow inspiratory efforts See surgery note for description of sacral wound Patient moving all 4 ext to command  H/H good this am after transfusions  A:  Hemorrhagic shock due to sacral wound bleed resolved after transfusion and suturing of bleeding vessel at bedside Severe necrotizing sacral wound Chronic respiratory failure after COVID s/p trach Dysphagia with cortrak in place Severe muscular deconditioning  P:  - To resume hydrotherapy - Palliative to meet with family today - Continue TC, progressive mobility as able with PT - If needs diverting ostomy would combine this procedure with a PEG if able - Otherwise stable for return to PCU appreciate IMTS taking back over care    04/17/2020 Wanda Emery MD      NAME:  Wanda Ramirez, MRN:  [id number], DOB:  1971-02-03, LOS: 76 ADMISSION DATE:  02/13/2020, CONSULTATION DATE:  9/24 REFERRING MD:  Wanda Ramirez, CHIEF COMPLAINT:  Dyspnea   Brief History   49 yo female admitted with COVID 19 pneumonia on 9/13, had been diagnosed with COVID on 9/9.  On 9/24 her condition worsened and PCCM was consulted, she was sent to the ICU and treated with BIPAP, then eventually intubated.  Started on CRRT through a tunneled HD cath. Extubated on 10/6 but re-intubated on 10/8. Now sp trach tolerating trach collar.   Past Medical History  Sleep apnea Morbid obesity Hypothyroidism Hypertension GERD  End-stage renal disease on HD  MWF Diabetes Asthma  Significant Hospital Events   9/14 admit 9/25 on BiPAP with Precedex overnight 9/26 -unable to run CRRT with prone ventilation.  Resume supine ventilation but still unable to draw blood back. 9/27 transferred to 18m. IR replaced HD catheter 10/1 on CRRT and vasopressors 10/6 - extubated 10/7 - had some stridor and increased WOB so placed on BIPAP and dexamethasone given.  10/15 weaning sedation. midaz off 10/16 move to 64M 10/18 s/p 6 proximal XLT Shiley tracheostomy  10/19: Resumed Eliquis low dose. Per Dr. Thompson Ramirez note 10/7 will need to be on [facility] for 1 month then reasses 10/19: Episode of hypoglycemia 55. 10/21 hypoglycemic again. levemir and tubefeed coverage decreased. Low grade temp->sputum culture send had vomiting event on 10/19 w/ concern for aspiration. Fent was stopped 10/20. Still gets anxious at night. Required increased precedex. Added clonazepam. 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 11/2 Surgical debridement  11/5 off vanc 11/7 off pressors 11/8 On trach collar, off zosyn surgery evaluated no further need for debridement  11/14 hemodynamic significant sacral wound bleeding. Surgery placed stitch. 11/15 hypotensive. Transferred back on ICU for pressor support 11/16 Off pressors Consults:  Nephrology General Surgery Palliative Care Procedures:  9/23 tunneled HD cath placement >  10/8 ETT > 10/18 10/18 6 proximal XLT Shiley tracheostomy  > 11/2  Sacral wound debridement Significant Diagnostic Tests:  9/21 Vas ultrasound> thrombosed LUE fistula 9/21 VQ scan > negative for PE 9/29 echo> LVEF 60-65%, flat ventrcile consistent with RV pressure overload, RV severely enlarged, moderate reduction in RV  function  Micro Data:  9/13 blood > neg   9/14 sars cov 2 > pos 9/26 blood > neg 10/8 resp > MSSA 10/8 blood > negative  10/21 sputum > Normal flora  10/25 Blood > ngtd x 3 days 10/25 Sputum  > few diphtheroids  10/25 Blood >>negative 10/29 Sputum >> Staph aureus 11/2 Blood>> negative 11/2 sputum >> negative 11/2 Wound >> E. Coli, enterococcus faecalis  11/15 Tracheal aspirate >> few gram negative rods, moderate gram variable rods Antimicrobials:  9/14 remdesivir > 9/19 9/15 tocilizumab > x1 9/20 cefepime > 9/29 9/20 azithro >  9/25 10/8 vanc > 10/9 10/8 zosyn > x1 10/10 ceftriaxone > 10/16 10/31 vancomycin >> 1/7 11/2 zosyn>>11/8 11/11 Merrem >>11/13 Interim history/subjective:  Awake and alert this morning able to follow simple commands. Indicating she would like to sit up in bed and continues to have significant pain with movement.  Objective   Blood pressure 120/80, pulse (!) 106, temperature 99.1 F (37.3 C), temperature source Oral, resp. rate (!) 24, height 5\' 2"  (1.575 m), weight 102.8 kg, SpO2 99 %.    FiO2 (%):  [28 %] 28 %   Intake/Output Summary (Last 24 hours) at 04/17/2020 0739 Last data filed at 04/17/2020 0430 Gross per 24 hour  Intake 1626.92 ml  Output 2000 ml  Net -373.08 ml   Filed Weights   04/13/20 0725 04/13/20 1117 04/16/20 1630  Weight: 106 kg 102.8 kg 102.8 kg   Examination: General: Frail african [place] female, appears chronically ill, uncomfortable from sacral wound  HEENT: 6 uncuffed proximal XLT Shiley, MM pink/moist, PERRL,  Neuro: Alert and awake, following simple commands, interactive  CV: Tachycardic, no murmur, rubs, or gallops,  PULM: Clear to auscultation bilaterally, no increased work of breathing, trach appears well-positioned with no secretions GI: soft, bowel sounds active in all 4 quadrants, non-tender, non-distended, tolerating TF Extremities: warm/dry, no edema  Skin: Stage 4 sacral wound dressing changed this  morning, several areas with small amount of oozing blood   Resolved Hospital Problem list   Thrombocytopenia  MSSA PNA  Nausea  Agitated delirium Shock  Assessment & Plan:   Hemorrhagic shock Now off pressors with MAPs >65. Does not appear to have further acute bleeding from sacral wound. Acute blood loss anemia - Hgb now 8.1. Patient transfused with 2 units pRBC yesterday P: General surgery following for wound care, appreciate assistance Continue pressure dressing to be removed per general surgery Hold subcutaneous heparin, resume when able   Acute hypoxic respiratory failure from COVID 19 pneumonia with ARDS complicated by MSSA PNA. Failure to wean s/p tracheostomy Tracheostomy placed 10/18.  P: Patient has tolerated transition to trach collar well Routine trach care Encourage pulmonary hygiene As needed bronchodilators  Necrotizing infection of soft tissue surrounding sacral wound -Patient began displaying evidence of sepsis with shock physiology 11/1 prompting her evaluation including CT scan that revealed gas surrounding soft tissue prompting surgical consult. Wound cultures positive for E. Coli and E. Faecalis. P: Patient has been undergoing hydrotherapy for treatment of significant decubitus/surgical wound, hold today, possibly restart tomorrow Wound care per surgery PT/OT Frequent turning repositioning  ESRD -On Tuesday Thursday Saturday scheduling prior to admission but since resumption of the HD during admission she has been on Monday  Wednesday Friday scheduling P: Nephrology following, appreciate assistance Received dialysis yesterday Follow renal function Enteral hydration  Acute metabolic encephalopathy in setting of sepsis Appears improved, she is able to follow simple commands and interact  P: RASS goal 0 Continue Klonopin, oxycodone, and Seroquel Encourage sleep-wake cycle  Hypothyroidism P: Continue home Synthroid  DM type 2 with labile  blood sugars. P: Continue SSI, Levemir, and tube feed coverage Check CBGs every 4 hours  Dysphagia. P: Core track in place, continue tube feeds Patient will need PEG tube placement once stabilized  Pressure ulcer -Unstageable sacral ulcer stage II right neck presure injury  P: Wound care Pressure alleviating devices Frequent turns  GOC: Palliative discussed GOC with daughter Lyncoln Maskell over phone yesterday. Siblings continue to be making decisions on mothers care together. Plan for siblings to speak with palliative as a group this afternoon. Best practice:  Diet: tube feeding DVT prophylaxis: SCDs GI prophylaxis: Protonix Mobility: Bed rest Code Status: DNR Family discussion: as able Disposition: ICU  Labs:   CMP Latest Ref Rng & Units 04/16/2020 04/15/2020 04/14/2020  Glucose 70 - 99 mg/dL 226(H) 148(H) 158(H)  BUN 6 - 20 mg/dL 101(H) 80(H) 45(H)  Creatinine 0.44 - 1.00 mg/dL 5.60(H) 4.49(H) 3.42(H)  Sodium 135 - 145 mmol/L 130(L) 132(L) 135  Potassium 3.5 - 5.1 mmol/L 5.8(H) 4.1 4.0  Chloride 98 - 111 mmol/L 95(L) 96(L) 98  CO2 22 - 32 mmol/L 17(L) 21(L) 23  Calcium 8.9 - 10.3 mg/dL 9.0 9.1 9.4  Total Protein 6.5 - 8.1 g/dL - - -  Total Bilirubin 0.3 - 1.2 mg/dL - - -  Alkaline Phos 38 - 126 U/L - - -  AST 15 - 41 U/L - - -  ALT 0 - 44 U/L - - -    CBC Latest Ref Rng & Units 04/16/2020 04/16/2020 04/15/2020  WBC 4.0 - 10.5 K/uL 10.9(H) 20.9(H) 15.6(H)  Hemoglobin 12.0 - 15.0 g/dL 7.0(L) 7.9(L) 7.3(L)  Hematocrit 36 - 46 % 20.8(L) 24.0(L) 22.8(L)  Platelets 150 - 400 K/uL 289 396 423(H)    ABG    Component Value Date/Time   PHART 7.250 (L) 04/03/2020 1030   PCO2ART 64.8 (H) 04/03/2020 1030   PO2ART 156 (H) 04/03/2020 1030   HCO3 28.4 (H) 04/03/2020 1030   TCO2 30 04/03/2020 1030   ACIDBASEDEF 2.0 04/03/2020 0907   O2SAT 99.0 04/03/2020 1030    CBG (last 3)  Recent Labs    04/16/20 1944 04/16/20 2337 04/17/20 0341  GLUCAP [address] [place]    Iona Beard PGY-1 Internal medicine 04/17/20 7:57 AM

## 2020-04-17 NOTE — Progress Notes (Signed)
Occupational Therapy Treatment Patient Details Name: Wanda Ramirez MRN: [id number] DOB: 08-25-1970 Today's Date: 04/17/2020    History of present illness 49 yo admitted 9/13 with Covid PNA, intubated 9/24-10/6, reintubated 10/8, CRRT 9/26-10/7, trach and bronch 10/18. Pt returned to vent 10/30-11/7. Pt with significant sacral wound to bone s/p I&D x 2. Pt with hemorrhage from wound 11/14, hemorrhagic shock 11/15. PMhx: ESRD TTS, obesity, HTN, GERD, hypothyroidism, DM, asthma   OT comments  Pt presents supine in bed agreeable to therapy session. Pt more participatory today and able to engage in bil UE A/AAROM across multiple planes. Pt requiring up to modA for basic grooming ADL, noted decreased ability to incorporate RUE into task (compared to L). She follows majority of simple commands but often requiring cues for redirection/attention to task at hand. VSS throughout on trach collar. Spoke with RN who reports order for Kreg Tilt bed has been placed. Will continue per POC at this time.    Follow Up Recommendations  LTACH;SNF    Equipment Recommendations  Other (comment);Hospital bed          Precautions / Restrictions Precautions Precautions: Fall;Other (comment) Precaution Comments: trach, cortrak, flexiseal, sacral wound Required Braces or Orthoses: Other Brace Other Brace: bil prevalons (has a pressure blood blister on the back of her R heel.  Restrictions Weight Bearing Restrictions: No              ADL either performed or assessed with clinical judgement   ADL Overall ADL's : Needs assistance/impaired     Grooming: Moderate assistance;Bed level Grooming Details (indicate cue type and reason): applying lotion to bil hands       Lower Body Bathing Details (indicate cue type and reason): pt able to apply lotion to upper half of bil LEs                                               Cognition Arousal/Alertness: Awake/alert Behavior During  Therapy: Flat affect Overall Cognitive Status: Difficult to assess Area of Impairment: Attention;Following commands;Safety/judgement                   Current Attention Level: Sustained Memory: Decreased short-term memory Following Commands: Follows one step commands inconsistently;Follows one step commands with increased time Safety/Judgement: Decreased awareness of safety;Decreased awareness of deficits   Problem Solving: Slow processing;Decreased initiation;Difficulty sequencing;Requires verbal cues;Requires tactile cues General Comments: requires cues for carryover of some activity - seems to lose attention easily         Exercises Exercises: General Upper Extremity;Other exercises General Exercises - Upper Extremity Shoulder Flexion: AAROM;Both;10 reps Shoulder Horizontal ABduction: AAROM;Both;10 reps Shoulder Horizontal ADduction: AAROM;Both;10 reps Elbow Flexion: AAROM;Both;10 reps Elbow Extension: AAROM;Both;10 reps Other Exercises Other Exercises: bil UE forearm supination/pronation    Shoulder Instructions       General Comments      Pertinent Vitals/ Pain       Pain Assessment: Faces Faces Pain Scale: Hurts a little bit Pain Location: LEs with certain movements Pain Descriptors / Indicators: Discomfort;Grimacing Pain Intervention(s): Limited activity within patient's tolerance;Monitored during session;Repositioned  Home Living                                          Prior Functioning/Environment  Frequency  Min 2X/week        Progress Toward Goals  OT Goals(current goals can now be found in the care plan section)  Progress towards OT goals: Progressing toward goals  Acute Rehab OT Goals Patient Stated Goal: pt agreeable to work with therapy OT Goal Formulation: With patient Time For Goal Achievement: 04/28/20 Potential to Achieve Goals: [place] Discharge plan remains appropriate;Frequency remains  appropriate    Co-evaluation                 AM-PAC OT "6 Clicks" Daily Activity     Outcome Measure   Help from another person eating meals?: Total Help from another person taking care of personal grooming?: A Lot Help from another person toileting, which includes using toliet, bedpan, or urinal?: Total Help from another person bathing (including washing, rinsing, drying)?: Total Help from another person to put on and taking off regular upper body clothing?: Total Help from another person to put on and taking off regular lower body clothing?: Total 6 Click Score: 7    End of Session Equipment Utilized During Treatment: Oxygen  OT Visit Diagnosis: Other abnormalities of gait and mobility (R26.89);Muscle weakness (generalized) (M62.81);Other symptoms and signs involving cognitive function   Activity Tolerance Patient tolerated treatment well   Patient Left in bed;with call bell/phone within reach   Nurse Communication Mobility status        Time: 2863-8177 OT Time Calculation (min): 17 min  Charges: OT General Charges $OT Visit: 1 Visit OT Treatments $Therapeutic Activity: 8-22 mins  Lou Cal, OT Acute Rehabilitation Services Pager [phone] Office [place] 04/17/2020, 5:06 PM

## 2020-04-17 NOTE — Progress Notes (Signed)
Physical Therapy Treatment Patient Details Name: Wanda Ramirez MRN: [id number] DOB: [birth year] Today's Date: 04/17/2020    History of Present Illness 49 yo admitted 9/13 with Covid PNA, intubated 9/24-10/6, reintubated 10/8, CRRT 9/26-10/7, trach and bronch 10/18. Pt returned to vent 10/30-11/7. Pt with significant sacral wound to bone s/p I&D x 2. Pt with hemorrhage from wound 11/14, hemorrhagic shock 11/15. PMhx: ESRD TTS, obesity, HTN, GERD, hypothyroidism, DM, asthma    PT Comments    Pt awake, alert and very willing to participate. Pt able to respond with head nods, mouthing words and speaking with trach occluded. Pt on 28% FiO2 trach collar with Spo2 98% and HR 106-110. Pt able to follow commands for moving all extremities, transferring to EOb and sitting for 10 min EOb. Pt with delayed response and increased weakness on LLE>RLe and RUE>LUE. Pt with medication prior to session and appeared to be kicking in end of session with pt lethargic end of session with return to bed. Will continue to follow and pt would benefit from Kreg tilt bed.     Follow Up Recommendations  LTACH;Supervision/Assistance - 24 hour     Equipment Recommendations  Wheelchair (measurements PT);Wheelchair cushion (measurements PT);Hospital bed;Other (comment)    Recommendations for Other Services       Precautions / Restrictions Precautions Precautions: Fall;Other (comment) Precaution Comments: trach, cortrak, flexiseal, sacral wound Other Brace: bil prevalons (has a pressure blood blister on the back of her R heel.     Mobility  Bed Mobility Overal bed mobility: Needs Assistance Bed Mobility: Rolling;Sidelying to Sit;Sit to Supine Rolling: Mod assist Sidelying to sit: Mod assist   Sit to supine: Total assist;+2 for physical assistance;+2 for safety/equipment   General bed mobility comments: physical assist with cues to roll to left, lift trunk from surface. Return to supine pt with increased  drowsiness and required assist for all aspects of return to supine as well as total +2 to slide toward [facility]  Transfers Overall transfer level: Needs assistance   Transfers: Sit to/from Stand Sit to Stand: Total assist;+2 physical assistance         General transfer comment: bil UE hooking with therapists and use of pad to support sacrum pt able to initiate anterior weight shift onto feet but could not engage quads to rise from surface  Ambulation/Gait                 Stairs             Wheelchair Mobility    Modified Rankin (Stroke Patients Only)       Balance   Sitting-balance support: Feet supported;Bilateral upper extremity supported Sitting balance-Leahy Scale: Fair Sitting balance - Comments: pt sitting 10 min EOb with guarding for bil LE HEP                                    Cognition Arousal/Alertness: Awake/alert Behavior During Therapy: Flat affect Overall Cognitive Status: Difficult to assess Area of Impairment: Attention;Following commands;Safety/judgement                   Current Attention Level: Sustained Memory: Decreased short-term memory Following Commands: Follows one step commands inconsistently;Follows one step commands with increased time Safety/Judgement: Decreased awareness of safety;Decreased awareness of deficits   Problem Solving: Slow processing;Decreased initiation;Difficulty sequencing;Requires verbal cues;Requires tactile cues General Comments: pt with decreased initiation and command following particularly on RUE and  LLE. Pt mouthing words and occluding trach to speak      Exercises General Exercises - Lower Extremity Long Arc Quad: AROM;AAROM;Right;Left;Seated;10 reps (AAROm on LLE) Hip Flexion/Marching: AAROM;Both;10 reps;Seated    General Comments        Pertinent Vitals/Pain Faces Pain Scale: Hurts a little bit Pain Location: sacrum when returning to supine Pain Descriptors / Indicators:  Grimacing Pain Intervention(s): Limited activity within patient's tolerance;Monitored during session;Repositioned    Home Living                      Prior Function            PT Goals (current goals can now be found in the care plan section) Progress towards PT goals: Progressing toward goals    Frequency    Min 2X/week      PT Plan Current plan remains appropriate    Co-evaluation              AM-PAC PT "6 Clicks" Mobility   Outcome Measure  Help needed turning from your back to your side while in a flat bed without using bedrails?: A Lot Help needed moving from lying on your back to sitting on the side of a flat bed without using bedrails?: A Lot Help needed moving to and from a bed to a chair (including a wheelchair)?: Total Help needed standing up from a chair using your arms (e.g., wheelchair or bedside chair)?: Total Help needed to walk in hospital room?: Total Help needed climbing 3-5 steps with a railing? : Total 6 Click Score: 8    End of Session Equipment Utilized During Treatment: Oxygen Activity Tolerance: Patient tolerated treatment well Patient left: in bed;with call bell/phone within reach (prevalons on) Nurse Communication: Mobility status PT Visit Diagnosis: Muscle weakness (generalized) (M62.81);Difficulty in walking, not elsewhere classified (R26.2);Pain     Time: 4193-7902 PT Time Calculation (min) (ACUTE ONLY): 27 min  Charges:  $Therapeutic Exercise: 8-22 mins $Therapeutic Activity: 8-22 mins                     Cristle Jared P, PT Acute Rehabilitation Services Pager: [phone] Office: [place] 04/17/2020, 10:14 AM

## 2020-04-17 NOTE — Progress Notes (Signed)
SLP Cancellation Note  Patient Details Name: Wanda Ramirez MRN: [id number] DOB: [birth year]   Cancelled treatment:        Attempted to see with speaking valve. She received Fentanyl prior to dressing change this morning along with Klonopin and Seroquel and not arousable. PT reported pt following command, speaking when PT occluded trach. Plan to see tomorrow morning prior to hydro therapy where she will receive pain meds.      [name] 04/17/2020, 10:10 AM  Orbie Pyo Colvin Caroli.Ed Risk analyst [phone] Office [phone]

## 2020-04-18 DIAGNOSIS — U071 COVID-19: Secondary | ICD-10-CM | POA: Diagnosis not present

## 2020-04-18 LAB — BPAM RBC
Blood Product Expiration Date: 202112182359
ISSUE DATE / TIME: 202111160112
Unit Type and Rh: 5100

## 2020-04-18 LAB — RENAL FUNCTION PANEL
Albumin: 2.2 g/dL — ABNORMAL LOW (ref 3.5–5.0)
Anion gap: 14 (ref 5–15)
BUN: 67 mg/dL — ABNORMAL HIGH (ref 6–20)
CO2: 24 mmol/L (ref 22–32)
Calcium: 9.4 mg/dL (ref 8.9–10.3)
Chloride: 97 mmol/L — ABNORMAL LOW (ref 98–111)
Creatinine, Ser: 4.39 mg/dL — ABNORMAL HIGH (ref 0.44–1.00)
GFR, Estimated: 12 mL/min — ABNORMAL LOW (ref >60)
Glucose, Bld: 173 mg/dL — ABNORMAL HIGH (ref 70–99)
Phosphorus: 7 mg/dL — ABNORMAL HIGH (ref 2.5–4.6)
Potassium: 3.9 mmol/L (ref 3.5–5.1)
Sodium: 135 mmol/L (ref 135–145)

## 2020-04-18 LAB — CBC
HCT: 24.2 % — ABNORMAL LOW (ref 36.0–46.0)
Hemoglobin: 7.8 g/dL — ABNORMAL LOW (ref 12.0–15.0)
MCH: 29.4 pg (ref 26.0–34.0)
MCHC: 32.2 g/dL (ref 30.0–36.0)
MCV: 91.3 fL (ref 80.0–100.0)
Platelets: 283 10*3/uL (ref 150–400)
RBC: 2.65 MIL/uL — ABNORMAL LOW (ref 3.87–5.11)
RDW: 14.3 % (ref 11.5–15.5)
WBC: 12.4 10*3/uL — ABNORMAL HIGH (ref 4.0–10.5)
nRBC: 0 % (ref 0.0–0.2)

## 2020-04-18 LAB — TYPE AND SCREEN
ABO/RH(D): O POS
Antibody Screen: NEGATIVE
Unit division: 0

## 2020-04-18 LAB — GLUCOSE, CAPILLARY
Glucose-Capillary: 109 mg/dL — ABNORMAL HIGH (ref 70–99)
Glucose-Capillary: 116 mg/dL — ABNORMAL HIGH (ref 70–99)
Glucose-Capillary: 120 mg/dL — ABNORMAL HIGH (ref 70–99)
Glucose-Capillary: 171 mg/dL — ABNORMAL HIGH (ref 70–99)
Glucose-Capillary: 80 mg/dL (ref 70–99)
Glucose-Capillary: 92 mg/dL (ref 70–99)

## 2020-04-18 LAB — HEPATITIS B SURFACE ANTIBODY, QUANTITATIVE: Hep B S AB Quant (Post): 191.6 m[IU]/mL (ref >9.9)

## 2020-04-18 LAB — CULTURE, RESPIRATORY W GRAM STAIN

## 2020-04-18 MED ORDER — RENA-VITE PO TABS
1.0000 | ORAL_TABLET | Freq: Every day | ORAL | Status: DC
  Administered 2020-04-18 – 2020-04-24 (×7): 1 via ORAL
  Filled 2020-04-18 (×7): qty 1

## 2020-04-18 MED ORDER — DARBEPOETIN ALFA 150 MCG/0.3ML IJ SOSY
PREFILLED_SYRINGE | INTRAMUSCULAR | Status: AC
  Administered 2020-04-18: 150 ug via INTRAVENOUS
  Filled 2020-04-18: qty 0.3

## 2020-04-18 MED ORDER — HEPARIN SODIUM (PORCINE) 1000 UNIT/ML IJ SOLN: 4000.0000 [IU] | Freq: Once | INTRAMUSCULAR | Status: DC

## 2020-04-18 NOTE — Progress Notes (Signed)
Assisted tele visit to patient with daughter.  Margaret Pyle, RN

## 2020-04-18 NOTE — Progress Notes (Signed)
Progress Note  15 Days Post-Op  Subjective: Seen with PT hydrotherapy.  Objective: Vital signs in last 24 hours: Temp:  [98 F (36.7 C)-98.7 F (37.1 C)] 98.3 F (36.8 C) (11/17 0811) Pulse Rate:  [92-113] 109 (11/17 1000) Resp:  [14-30] 16 (11/17 0833) BP: (101-136)/(61-79) 112/73 (11/17 1137) SpO2:  [94 %-99 %] 95 % (11/17 1000) FiO2 (%):  [28 %] 28 % (11/17 1137) Last BM Date: 04/18/20  Intake/Output from previous day: 11/16 0701 - 11/17 0700 In: 863 [NG/GT:780; IV Piggyback:83] Out: 100 [Stool:100] Intake/Output this shift: Total I/O In: 150 [NG/GT:150] Out: -   PE: Gen: Awake, NAD Pulm: Rate and effort normal  Wounds:Large sacral/left buttocks wound. Exposed sacrum in wound base with fibrinous exudate present. Healthy granulation tissue stable. Undermining extending left inferolaterally as seen in first photo, no purulence expressed from that tract but extends about 12 cm. Right lateral wound surface with necrotic tissue and a tract that extends about 3 cm as seen in 2nd photo. Inferior flap with eschar along distal edge. No uncontrolled bleeding upon packing removal.          Lab Results:  Recent Labs    04/17/20 0744 04/18/20 0818  WBC 10.5 12.4*  HGB 8.1* 7.8*  HCT 24.2* 24.2*  PLT 264 283   BMET Recent Labs    04/17/20 0744 04/18/20 0818  NA 135 135  K 3.0* 3.9  CL 96* 97*  CO2 26 24  GLUCOSE 120* 173*  BUN 40* 67*  CREATININE 3.02* 4.39*  CALCIUM 9.3 9.4   PT/INR Recent Labs    04/16/20 1730  LABPROT 13.1  INR 1.0   CMP     Component Value Date/Time   NA 135 04/18/2020 0818   K 3.9 04/18/2020 0818   CL 97 (L) 04/18/2020 0818   CO2 24 04/18/2020 0818   GLUCOSE 173 (H) 04/18/2020 0818   BUN 67 (H) 04/18/2020 0818   CREATININE 4.39 (H) 04/18/2020 0818   CALCIUM 9.4 04/18/2020 0818   PROT 5.9 (L) 04/13/2020 0500   ALBUMIN 2.2 (L) 04/18/2020 0818   AST 25 04/13/2020 0500   ALT 25 04/13/2020 0500   ALKPHOS 316 (H)  04/13/2020 0500   BILITOT 0.8 04/13/2020 0500   GFRNONAA 12 (L) 04/18/2020 0818   GFRAA 33 (L) 03/05/2020 1703   Lipase  No results found for: LIPASE     Studies/Results: No results found.  Anti-infectives: Anti-infectives (From admission, onward)   Start     Dose/Rate Route Frequency Ordered Stop   04/12/20 1800  meropenem (MERREM) 500 mg in sodium chloride 0.9 % 100 mL IVPB  Status:  Discontinued        500 mg 200 mL/hr over 30 Minutes Intravenous Every 24 hours 04/12/20 1746 04/14/20 1426   04/06/20 1200  vancomycin (VANCOCIN) IVPB 1000 mg/200 mL premix        1,000 mg 200 mL/hr over 60 Minutes Intravenous Every Fri (Hemodialysis) 04/05/20 1529 04/06/20 1300   04/04/20 1200  vancomycin (VANCOCIN) IVPB 1000 mg/200 mL premix        1,000 mg 200 mL/hr over 60 Minutes Intravenous Every M-W-F (Hemodialysis) 04/04/20 1014 04/04/20 1423   04/03/20 0000  piperacillin-tazobactam (ZOSYN) IVPB 2.25 g  Status:  Discontinued        2.25 g 100 mL/hr over 30 Minutes Intravenous Every 8 hours 04/02/20 2253 04/09/20 1559   04/02/20 2345  vancomycin (VANCOCIN) IVPB 1000 mg/200 mL premix  1,000 mg 200 mL/hr over 60 Minutes Intravenous  Once 04/02/20 2251 04/03/20 0236   04/02/20 2251  vancomycin variable dose per unstable renal function (pharmacist dosing)  Status:  Discontinued         Does not apply See admin instructions 04/02/20 2251 04/07/20 1037   04/02/20 1200  ceFAZolin (ANCEF) IVPB 1 g/50 mL premix  Status:  Discontinued        1 g 100 mL/hr over 30 Minutes Intravenous Every 24 hours 04/02/20 1030 04/02/20 2251   04/01/20 1300  vancomycin (VANCOCIN) 2,000 mg in sodium chloride 0.9 % 500 mL IVPB  Status:  Discontinued        2,000 mg 250 mL/hr over 120 Minutes Intravenous  Once 04/01/20 1203 04/01/20 1203   04/01/20 1300  vancomycin (VANCOREADY) IVPB 2000 mg/400 mL        2,000 mg 200 mL/hr over 120 Minutes Intravenous Every 12 hours 04/01/20 1205 04/01/20 1715   04/01/20  1300  vancomycin (VANCOCIN) 2,000 mg in sodium chloride 0.9 % 500 mL IVPB  Status:  Discontinued        2,000 mg 250 mL/hr over 120 Minutes Intravenous  Once 04/01/20 1207 04/01/20 1208   04/01/20 1245  vancomycin (VANCOCIN) 2,000 mg in sodium chloride 0.9 % 500 mL IVPB  Status:  Discontinued        2,000 mg 250 mL/hr over 120 Minutes Intravenous  Once 04/01/20 1154 04/01/20 1206   04/01/20 1154  vancomycin variable dose per unstable renal function (pharmacist dosing)  Status:  Discontinued         Does not apply See admin instructions 04/01/20 1154 04/02/20 1030   03/11/20 1200  cefTRIAXone (ROCEPHIN) 2 g in sodium chloride 0.9 % 100 mL IVPB        2 g 200 mL/hr over 30 Minutes Intravenous Every 24 hours 03/11/20 1107 03/17/20 1307   03/10/20 1200  piperacillin-tazobactam (ZOSYN) IVPB 3.375 g  Status:  Discontinued        3.375 g 12.5 mL/hr over 240 Minutes Intravenous Every 12 hours 03/10/20 1117 03/10/20 1245   03/10/20 1130  vancomycin (VANCOCIN) IVPB 1000 mg/200 mL premix        1,000 mg 200 mL/hr over 60 Minutes Intravenous  Once 03/10/20 1117 03/10/20 1258   03/10/20 1000  vancomycin variable dose per unstable renal function (pharmacist dosing)  Status:  Discontinued         Does not apply See admin instructions 03/09/20 1412 03/14/20 0810   03/09/20 2300  piperacillin-tazobactam (ZOSYN) IVPB 2.25 g  Status:  Discontinued        2.25 g 100 mL/hr over 30 Minutes Intravenous Every 8 hours 03/09/20 1412 03/10/20 1117   03/09/20 1415  piperacillin-tazobactam (ZOSYN) IVPB 3.375 g        3.375 g 100 mL/hr over 30 Minutes Intravenous  Once 03/09/20 1412 03/09/20 1537   03/09/20 1415  vancomycin (VANCOCIN) 2,250 mg in sodium chloride 0.9 % 500 mL IVPB        2,250 mg 250 mL/hr over 120 Minutes Intravenous  Once 03/09/20 1412 03/09/20 1639   02/28/20 2200  ceFEPIme (MAXIPIME) 1 g in sodium chloride 0.9 % 100 mL IVPB        1 g 200 mL/hr over 30 Minutes Intravenous Every 12 hours 02/28/20  1305 02/29/20 2220   02/28/20 0907  ceFEPIme (MAXIPIME) 1 g in sodium chloride 0.9 % 100 mL IVPB  Status:  Discontinued  1 g 200 mL/hr over 30 Minutes Intravenous Every 24 hours 02/27/20 0939 02/28/20 1305   02/26/20 2200  ceFEPIme (MAXIPIME) 2 g in sodium chloride 0.9 % 100 mL IVPB  Status:  Discontinued        2 g 200 mL/hr over 30 Minutes Intravenous Every 12 hours 02/26/20 1519 02/27/20 0939   02/25/20 1200  ceFEPIme (MAXIPIME) 2 g in sodium chloride 0.9 % 100 mL IVPB  Status:  Discontinued        2 g 200 mL/hr over 30 Minutes Intravenous Every T-Th-Sa (Hemodialysis) 02/25/20 0955 02/26/20 1519   02/24/20 1904  azithromycin (ZITHROMAX) 500 mg in sodium chloride 0.9 % 250 mL IVPB  Status:  Discontinued        500 mg 250 mL/hr over 60 Minutes Intravenous Every 24 hours 02/24/20 1904 02/26/20 0815   02/24/20 1900  azithromycin (ZITHROMAX) 250 mg in dextrose 5 % 125 mL IVPB  Status:  Discontinued        250 mg 125 mL/hr over 60 Minutes Intravenous Every 24 hours 02/24/20 1845 02/24/20 1904   02/24/20 1800  ceFEPIme (MAXIPIME) 2 g in sodium chloride 0.9 % 100 mL IVPB  Status:  Discontinued        2 g 200 mL/hr over 30 Minutes Intravenous Every Fri (Hemodialysis) 02/24/20 1326 02/25/20 0942   02/22/20 1745  ceFAZolin (ANCEF) IVPB 2g/100 mL premix        2 g 200 mL/hr over 30 Minutes Intravenous  Once 02/22/20 1744 02/22/20 1931   02/22/20 0600  ceFAZolin (ANCEF) IVPB 2g/100 mL premix        2 g 200 mL/hr over 30 Minutes Intravenous To Radiology 02/21/20 1658 02/22/20 1900   02/22/20 0000  ceFAZolin (ANCEF) IVPB 1 g/50 mL premix  Status:  Discontinued        1 g 100 mL/hr over 30 Minutes Intravenous To Radiology 02/21/20 1645 02/21/20 1658   02/21/20 1800  azithromycin (ZITHROMAX) tablet 250 mg  Status:  Discontinued       "Followed by" Linked Group Details   250 mg Oral Daily-1800 02/20/20 1658 02/24/20 1852   02/21/20 1800  ceFEPIme (MAXIPIME) 2 g in sodium chloride 0.9 % 100 mL  IVPB  Status:  Discontinued        2 g 200 mL/hr over 30 Minutes Intravenous Every T-Th-Sa (1800) 02/20/20 1737 02/24/20 1326   02/20/20 1800  azithromycin (ZITHROMAX) tablet 500 mg       "Followed by" Linked Group Details   500 mg Oral Daily 02/20/20 1658 02/20/20 1731   02/20/20 1745  ceFEPIme (MAXIPIME) 1 g in sodium chloride 0.9 % 100 mL IVPB        1 g 200 mL/hr over 30 Minutes Intravenous STAT 02/20/20 1737 02/21/20 0747   02/19/20 1145  remdesivir 100 mg in sodium chloride 0.9 % 100 mL IVPB        100 mg 200 mL/hr over 30 Minutes Intravenous Daily 02/19/20 1131 02/19/20 1443   02/15/20 1000  remdesivir 100 mg in sodium chloride 0.9 % 100 mL IVPB  Status:  Discontinued       "Followed by" Linked Group Details   100 mg 200 mL/hr over 30 Minutes Intravenous Daily 02/14/20 0944 02/14/20 0951   02/15/20 1000  remdesivir 100 mg in sodium chloride 0.9 % 100 mL IVPB        100 mg 200 mL/hr over 30 Minutes Intravenous Daily 02/14/20 0952 02/17/20 1102   02/14/20 1030  remdesivir 100 mg in sodium chloride 0.9 % 100 mL IVPB        100 mg 200 mL/hr over 30 Minutes Intravenous Every 30 min 02/14/20 0952 02/14/20 1129   02/14/20 0945  remdesivir 200 mg in sodium chloride 0.9% 250 mL IVPB  Status:  Discontinued       "Followed by" Linked Group Details   200 mg 580 mL/hr over 30 Minutes Intravenous Once 02/14/20 0944 02/14/20 0951       Assessment/Plan OSA Morbid obesityBMI 44 Hypothyroidism HTN GERD ESRDon HD DM Asthma Chronic respiratory failure 2/2 Covid-19 (diagnosed 02/09/20) S/p tracheostomy Code status DNR  Necrotizing soft tissue infection S/pincision and debridement of sacrum, left gluteus11/2 Dr. Bobbye Morton -POD#15 - Culture: E COLI and ENTEROCOCCUS FAECALIS. Currently on [place] resumed today, concerned about R lateral wound surface - will reassess Friday this week, if not cleaning up with hydrotherapy may need to consider OR Monday 11/22 - if we  were to return to OR at any point for further I&D, would recommend discussion regarding diverting colostomy as well - will discuss this with MD today    LOS: 64 days    Norm Parcel , [facility] 04/18/2020, 12:26 PM Please see Amion for pager number during day hours 7:00am-4:30pm

## 2020-04-18 NOTE — Progress Notes (Signed)
  Speech Language Pathology Treatment: [name] Speaking valve  Patient Details Name: Wanda Ramirez MRN: [id number] DOB: 31-Dec-1970 Today's Date: 04/18/2020 Time: 2130-8657 SLP Time Calculation (min) (ACUTE ONLY): 14 min  Assessment / Plan / Recommendation Clinical Impression  PMV treatment this morning prior to pain meds in preparation for hydrotherapy with PT. Easily awakened and sustained for 15 minutes while tolerating speaking valve. Nasal exhilatory emission with stable respirations and O2 sats and pt calm throughout. Session targeted increasing pulmonary volume and intensity with verbal/visual SLP demonstrations. Fleeting, audible phonation during responses to biographical info with < 25% intelligibility. Will focus on increasing respiratory support and pt's awareness of intensity to make needs known verbally versus mouthing or gestures. She is very deconditioned with complicated hospitalization.   Pt has not yet been appropriate for swallow assessment. Therapist suspects with improvements and evaluation she may be safe for modified po's but unlikely she will meet nutritional needs with po's alone. Her decreased endurance and alertness are factors prohibiting success presently. She needs pain meds for wound care and klonopin, Seroquel cocktail may prevent po consumption however will continue to monitor and request assessment when ready.    HPI HPI: 49 year old female with PMH of with end-stage renal disease on hemodialysis and chronic lung disease on chronic supplemental oxygen at 3 L/min. Admitted 9/13 with acute on chronic hypoxic respiratory failure due to COVID-19 pneumonia. Intubated 9/24-10/6, reintubated 10/8, CRRT 9/26-10/7, trach and bronch 10/18, trach collar 11/8. PMV trials began 10/27 with minimal toleration, SLP discharged 11/3 due to medical decline. Reconsulted 11/10. Pt currently has cuffed Shiley 6      SLP Plan  Continue with current plan of care        Recommendations         Patient may use Passy-Muir Speech Valve: with SLP only PMSV Supervision: Full         Oral Care Recommendations: Oral care QID Follow up Recommendations: Skilled Nursing facility;LTACH SLP Visit Diagnosis: Aphonia (R49.1) Plan: Continue with current plan of care       GO                [name] 04/18/2020, 8:58 AM  Orbie Pyo Colvin Caroli.Ed Risk analyst [phone] Office [phone]

## 2020-04-18 NOTE — Progress Notes (Signed)
Assisted tele visit to patient with family member.  Wanda Woodfin Anderson, RN   

## 2020-04-18 NOTE — Progress Notes (Signed)
[facility] Progress Note   OP HD:MWF here (was TTS at OP unit) 4h 56mn 450/800 2/2.25 bath 111.5kg Hep 2000 L AVF - darbe 50 ug q week, last 9/7 - calc 1.0 tiw - 9/9 Hb 10.0, tsat 22%  49yo female w/ ESRD on TTS HD admitted for COVID PNA on 02/13/20. She was intubated. SP CRRT around 9/26- 10/7. Now is on intermittent HD MWF here. She is sp trach. She had MSSA pna. DM2.Then she had large sacral decub requiring I&D.   Assessment/ Plan:   1. COVID pna /sp trach - off the vent now is on trach collar 2. Hypotension - recurrent issue, back on IV abx and off pressors now 3. Sacral decub w/ necrosis - sp I&D 11/2 and again 11/5 by gen surg. Surgery following.  Currently on merrem. 4. ESRD -getting HD MWF here now.sp CRRT 9/26- 10/7. HD today 5. Volume - euvolemic no edema on exam now, low UF today on HD 4. HD access: LUA AVF clotted while here. TDC x2 here by IR.LIJ TC 5. Anemia ckd/ critical illness - on darbe 150 weekly, 11/12 Ferritin >4000, iron sat 11.  With active infection and ^^ ferritin no IV iron. tx prbc's PRN 6. MBD ckd - Ca and phos in range, binders dc'd. Phos 3.9 10/30 > 9.2 11/8 --> 6.5.  RD switched to nepro  7. Nutrition - getting NG tube feeds, rena-vite 8. S/P MSSA HCAP/ VAP 9. DM2 - per pmd  RKelly Splinter MD 04/18/2020, 11:36 AM    Subjective:   No new problems today.    Objective:   BP 122/69   Pulse (!) 109   Temp 98.3 F (36.8 C) (Oral)   Resp 16   Ht _0  (1.575 m)   Wt 102.8 kg   SpO2 95%   BMI 41.45 kg/m   Intake/Output Summary (Last 24 hours) at 04/18/2020 1136 Last data filed at 04/18/2020 1000 Gross per 24 hour  Intake 710 ml  Output 100 ml  Net 610 ml   Weight change:   Physical Exam: Awake, will respond minimally to conversation , +trach collar ,wiggles toes Lungs - occ cough, occ rhonchi, no wheezing CVS-tachy ABD- BS present soft non-distended  EXT- trace pedal edema, lt arm access  thrombosed ACCESS: LIJ TC  Imaging: No results found.  Labs: BMET Recent Labs  Lab 04/12/20 0520 04/13/20 0500 04/14/20 0719 04/15/20 0449 04/16/20 0400 04/17/20 0744 04/18/20 0818  NA 140 138 135 132* 130* 135 135  K 4.1 4.8 4.0 4.1 5.8* 3.0* 3.9  CL 99 98 98 96* 95* 96* 97*  CO2 _1 21* 17* 26 24  GLUCOSE 143* 140* 158* 148* 226* 120* 173*  BUN 36* 60* 45* 80* 101* 40* 67*  CREATININE 3.18* 4.55* 3.42* 4.49* 5.60* 3.02* 4.39*  CALCIUM 9.2 9.4 9.4 9.1 9.0 9.3 9.4  PHOS 6.5*  --  7.3* 8.9* 11.1* 5.9* 7.0*   CBC Recent Labs  Lab 04/12/20 0520 04/13/20 0500 04/16/20 0400 04/16/20 1556 04/17/20 0744 04/18/20 0818  WBC 18.0*   < > 20.9* 10.9* 10.5 12.4*  NEUTROABS 14.6*  --   --   --   --   --   HGB 8.4*   < > 7.9* 7.0* 8.1* 7.8*  HCT 26.7*   < > 24.0* 20.8* 24.2* 24.2*  MCV 91.4   < > 88.9 87.0 90.3 91.3  PLT 427*   < > 396 289 264 283   < > =  values in this interval not displayed.    Medications:    . sodium chloride   Intravenous Once  . sodium chloride   Intravenous Once  . sodium chloride   Intravenous Once  . sodium chloride   Intravenous Once  . B-complex with vitamin C  1 tablet Per Tube Daily  . chlorhexidine gluconate (MEDLINE KIT)  15 mL Mouth Rinse BID  . Chlorhexidine Gluconate Cloth  6 each Topical Q0600  . Chlorhexidine Gluconate Cloth  6 each Topical Q0600  . clonazepam  0.5 mg Per Tube TID  . darbepoetin (ARANESP) injection - DIALYSIS  150 mcg Intravenous Q Wed-HD  . feeding supplement (PROSource TF)  45 mL Per Tube TID  . free water  20 mL Per Tube Q6H  . hydrocerin   Topical Daily  . insulin aspart  0-20 Units Subcutaneous Q4H  . insulin aspart  7 Units Subcutaneous Q4H  . insulin detemir  30 Units Subcutaneous BID  . levothyroxine  125 mcg Per Tube Q0600  . midodrine  10 mg Per Tube TID WC  . nutrition supplement (JUVEN)  1 packet Per Tube BID  . oxyCODONE  10 mg Per Tube Q6H  . pantoprazole sodium  40 mg Per Tube BID  .  QUEtiapine  100 mg Per Tube BID  . sodium chloride flush  10-40 mL Intracatheter Q12H  . Thrombi-Pad  1 each Topical Once

## 2020-04-18 NOTE — Progress Notes (Signed)
Physical Therapy Wound Treatment Patient Details  Name: Wanda Ramirez MRN: [id number] Date of Birth: [birth year]  Today's Date: 04/18/2020 Time: 1200-1256 Time Calculation (min): 56 min  Subjective  Subjective: Nodding head in agreement to PT Patient and Family Stated Goals: None stated at this time.  Date of Onset:  (Unknown) Prior Treatments: Dressing changes  Pain Score:  Pt was premedicated.  Occasional grimace during session but overall tolerated.   Wound Assessment  Pressure Injury 03/06/20 Sacrum Mid Unstageable - Full thickness tissue loss in which the base of the injury is covered by slough (yellow, tan, gray, green or brown) and/or eschar (tan, brown or black) in the wound bed. (Active)  Dressing Type Gauze (Comment);ABD;Barrier Film (skin prep) 04/18/20 1359  Dressing Changed;Clean;Dry;Intact 04/18/20 1359  Dressing Change Frequency Daily 04/18/20 1359  State of Healing Non-healing 04/17/20 1900  Site / Wound Assessment Red;Yellow;Brown;Bleeding 04/18/20 1359  % Wound base Red or Granulating 65% 04/18/20 1359  % Wound base Yellow/Fibrinous Exudate 10% 04/18/20 1359  % Wound base Black/Eschar 15% 04/18/20 1359  % Wound base Other/Granulation Tissue (Comment) 10% 04/18/20 1359  Peri-wound Assessment Intact 04/18/20 1359  Wound Length (cm) 16.5 cm 04/14/20 1458  Wound Width (cm) 20.3 cm 04/14/20 1458  Wound Depth (cm) 6 cm 04/14/20 1458  Wound Surface Area (cm^2) 334.95 cm^2 04/14/20 1458  Wound Volume (cm^3) 2009.7 cm^3 04/14/20 1458  Tunneling (cm) 11.8 at 8:00, 4.2 at 5:00 04/18/20 1359  Undermining (cm) 6.7 cm from 6:00-8:00 04/14/20 1458  Margins Unattached edges (unapproximated) 04/18/20 1359  Drainage Amount Moderate 04/18/20 1359  Drainage Description Serosanguineous 04/18/20 1359  Treatment Debridement (Selective);Hydrotherapy (Pulse lavage);Packing (Saline gauze) 04/18/20 1359         Hydrotherapy Pulsed lavage therapy - wound location: sacrum Pulsed  Lavage with Suction (psi): 10 psi Pulsed Lavage with Suction - Normal Saline Used: 1000 mL Pulsed Lavage Tip: Tip with splash shield Selective Debridement Selective Debridement - Location: sacrum Selective Debridement - Tools Used: Forceps;Scissors;Scalpel Selective Debridement - Tissue Removed: yellow and black necrotic tissue, necrotic adipose (focused on necrosis on flap and at 5:00)   Wound Assessment and Plan  Wound Therapy - Assess/Plan/Recommendations Wound Therapy - Clinical Statement: Pt tolerated hydrotherapy well.  Surgical PAs in at beginning of treatment and recommend focus on area of necrosis at 5:00 and on flap.  Pt did not have excessive bleeding today.   Did note new tunnel at 5:00 .  Able to debride moderate amount of necrosis.  Will continue to benefit from debridement and hydrotherapy for removal of unviable tissues, to decrease bioburden, and promote wound bed healing.  Wound Therapy - Functional Problem List: Global weakness in the setting of COVID and extended hospital stay. Factors Delaying/Impairing Wound Healing: Incontinence;Infection - systemic/local;Immobility;Multiple medical problems;Polypharmacy;Diabetes Mellitus Hydrotherapy Plan: Debridement;Dressing change;Patient/family education;Pulsatile lavage with suction Wound Therapy - Frequency: 6X / week Wound Therapy - Follow Up Recommendations: Skilled nursing facility Wound Plan: See above  Wound Therapy Goals- Improve the function of patient's integumentary system by progressing the wound(s) through the phases of wound healing (inflammation - proliferation - remodeling) by: Decrease Necrotic Tissue to: 0 Decrease Necrotic Tissue - Progress: Progressing toward goal Increase Granulation Tissue to: 100% Increase Granulation Tissue - Progress: Progressing toward goal Improve Drainage Characteristics: Min;Serous Improve Drainage Characteristics - Progress: Progressing toward goal Goals/treatment plan/discharge plan  were made with and agreed upon by patient/family: Yes Time For Goal Achievement: 7 days Wound Therapy - Potential for Goals: Excellent  Goals will be updated  until maximal potential achieved or discharge criteria met.  Discharge criteria: when goals achieved, discharge from hospital, MD decision/surgical intervention, no progress towards goals, refusal/missing three consecutive treatments without notification or medical reason.  GP    Abran Richard, PT Acute Rehab Services Pager [phone] [facility] [place] 04/18/2020, 2:09 PM

## 2020-04-18 NOTE — Progress Notes (Addendum)
NAME:  Wanda Ramirez, MRN:  [id number], DOB:  10-Dec-1970, LOS: 14 ADMISSION DATE:  02/13/2020   Interm history/ Subjective   IMTS resuming care this morning. No significant overnight events.  PT notes reviewed: Able to sit at edge of bed with PT yesterday.   Palliative care note reviewed: In the note, it is stated that Wanda Ramirez is upset that she is speaking with the Wanda Ramirez twice daily but it is implied that she needs information from MD directly.  Pt denies pain this morning.  Pt's daughter, Wanda Ramirez, updated via telephone. She denies questions at this time.  Significant Hospital Events   9/14 Admission to general floor 9/23 Tunneled HD cath placed 9/26 Transferred to ICU for hypotension and CRRT 10/1 On CRRT and vasopressors 10/6 Extubated 10/16 Moved off COVID floor 10/18 tracheostomy performed 10/31 Restart vanc for staph in resp culture 11/2 Surgical debridement for worsening sacral wound 11/7 Off pressors 11/8 Off antibiotics, surgery sign off  11/10 IMTS to assume care 11/11 febrile. merrem started. Surgery reconsulted 11/12 no acute events 11/13 merrem stopped 11/14 hemodynamic significant sacral wound bleeding. Surgery placed stitch. 11/15 tx to ICU for hemorrhagic shock. Started on pressors. 11/16 off pressors. 11/17 transferred back to IMTS  Objective   Blood pressure 127/71, pulse 98, temperature 98 F (36.7 C), temperature source Oral, resp. rate 20, height 5\' 2"  (1.575 m), weight 102.8 kg, SpO2 96 %.     Intake/Output Summary (Last 24 hours) at 04/18/2020 0642 Last data filed at 04/17/2020 1900 Gross per 24 hour  Intake 1457.38 ml  Output 100 ml  Net 1357.38 ml   Filed Weights   04/13/20 0725 04/13/20 1117 04/16/20 1630  Weight: 106 kg 102.8 kg 102.8 kg    Examination: General: chronically ill appearing HEENT: tracheostomy present. No surrounding drainage or secretions visible. Cardiac: sinus tach. No LE edema. Extremities warm. L AVF without  palpable thrill. Pulm: O2 sats around 94-96% on room air (supplemental O2 trach mask not on trach). Upper respiratory sounds present.  Consults:  Nephrology General surgery Palliative care Pulm  Significant Diagnostic Tests:  9/13 CXR>> bilateral patchy airspace opacities 9/21 VQ scan>> no PE 9/29 echocardiogram>> normal LVEF, findings consistent with increased RV volume and fluid overload, moderately reduced RV function 10/28 CT abdomen/pelvis >>scattered ground glass and consolidative opacities in the lung bases. Cystic space in the anterior, subpleural RML with septations favoring complicated pneumatocele. Non-obstructive punctuate nephrolithiasis 11/1 CT abdomen/pelvis>> persistent multifocal bibasilar airspace disease with scarring. Bullous changes seen in RML. Decubitus ulcer overlying the sacrococcygeal junction with extensive gas seen in the left gluteal region involving the muscle and fat. No evidence of abscess. Findings consistent with cellulitis and myositis. No evidence of body destruction to suggest osteomyelitis.  Micro Data:  9/13 blood culture>>no growth 9/26 blood culture>> no growth 10/8 resp culture>>MSSA, resistant to clinda, erythromycin, clindamycin 10/8 blood culture>> no growth 10/18 resp culture>> normal resp flora 10/21 resp culture>> normal resp flora 10/24 blood culture>> no growth 10/25 resp culture>> few cornebacterium 10/29 resp culture>> MSSA, resistant to clinda, erythromycin, clinda 11/2 blood culture>> no growth 11/2 resp culture>> no growth 11/2 wound culture>> e.coli resistant to ampicillin, cephalosporins, bactrim, e. Faecalis pan sensitive 11/9 blood culture>> no growth 11/11 blood culture>> NG 11/15 tracheal aspirate>> Antimicrobials:  Remdesivir 9/14>>9/19 Cefepime 9/20>>9/29 Azithromycin 9/20>>9/25 Zosyn 10/8, 11/1>11/8 Vanc 10/8, 10/31>>11/5 Rocephin>>10/10>>10/16 Merrem 11/11>>11/13  Procedures/lines  Tracheostomy shiley 6.0 XLT  10/18 Sacral wound debridement--11/2  Tunneled HD line 9/27>> Central line 11/2>> Rectal  tube 10/2>> Cortrak 10/4>>  Central line 9/26-10/24 A-line 9/30>>10/13, 11/2-11/9 HD line 9/22-9/27 ETT 9/26-10/6 ETT 10/8-10/18  Labs    CBC Latest Ref Rng & Units 04/17/2020 04/16/2020 04/16/2020  WBC 4.0 - 10.5 K/uL 10.5 10.9(H) 20.9(H)  Hemoglobin 12.0 - 15.0 g/dL 8.1(L) 7.0(L) 7.9(L)  Hematocrit 36 - 46 % 24.2(L) 20.8(L) 24.0(L)  Platelets 150 - 400 K/uL 264 289 396   BMP Latest Ref Rng & Units 04/17/2020 04/16/2020 04/15/2020  Glucose 70 - 99 mg/dL 120(H) 226(H) 148(H)  BUN 6 - 20 mg/dL 40(H) 101(H) 80(H)  Creatinine 0.44 - 1.00 mg/dL 3.02(H) 5.60(H) 4.49(H)  Sodium 135 - 145 mmol/L 135 130(L) 132(L)  Potassium 3.5 - 5.1 mmol/L 3.0(L) 5.8(H) 4.1  Chloride 98 - 111 mmol/L 96(L) 95(L) 96(L)  CO2 22 - 32 mmol/L 26 17(L) 21(L)  Calcium 8.9 - 10.3 mg/dL 9.3 9.0 9.1    Summary  67 yof admitted 02/17/20 for COVID-19 pneumonia who developed chronic respiratory failure requiring a tracheostomy. Her hospitalization has been complicated by recurrent respiratory infections as well as the development of a sacral decubitus ulcer. She was transferred to the IMTS 11/10 for ongoing care while awaiting the placement of a PEG tube. Unfortunately, her sacral wound developed an arterial bleed on 11/14 and she subsequently developed hemorrhagic shock requiring transfer back to ICU for pressor support. She was transferred back to IMTS 11/17 off pressors.  Palliative care is discussing goals of care with family as patient has been unable to express her wishes up to this point. Establishing goals of care will help direct future surgical management.  Assessment & Plan:  Principal Problem:   COVID-19 Active Problems:   End stage renal disease on dialysis (Wanda Ramirez)   Diabetes mellitus type 2 in obese (Wanda Ramirez)   OSA (obstructive sleep apnea)   HCAP (healthcare-associated pneumonia)   Acute respiratory failure with  hypoxemia (Wanda Ramirez)   Encephalopathy acute   ARDS (adult respiratory distress syndrome) (Wanda Ramirez)   Pressure injury of skin   Aspiration into airway   Status post tracheostomy (Wanda Ramirez)   Fever   Septic shock (Wanda Ramirez)   Cellulitis of buttock   Palliative care encounter   Sacral wound  ICU delirium --continue seroquel, klonopin. --delirium precautions.   ESRD on iHD MWF. AVF clotted off resulting in need for use of tunneled HD line. On midodrine 10 mg TID --Management per nephrology.   Hemorrhagic shock requiring pressor support secondary to sacral wound bleed. (resolved)  Now off pressors. Acute blood loss anemia. S/p 2U PRBC transfusion 11/15. Total PRBC transfusions since admission--#16. Anemia of CKD/critical illness . On aranesp, midodrine.  --daily CBC Leukocytosis. Low suspicion for active infection. No systemic signs. White count is down quite a bit from last week which is additionally reassuring. Off merrem since 11/12.  I suspect that she may continue to have a leukocytosis that is reactive to her sacral ulcer. No further workup at this time.  Chronic hypoxic respiratory failure s/p tracheostomy secondary to COVID 19 pneumonia complicated by MSSA pneumonia--unable to be weaned from vent. Stable on trach collar on 5L  6.0 shiley XLT cuffless.  PT notes suggest she has been able to speak softly with trach occlusion.  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 Unfortunately, unlikely to heal until more mobile and is high risk for infection Multi-drug resistant bacteria as noted on prior wound culture.  Plan --Appreciate gen  surgery recs. Resume hydrotherapy when cleared by surgery. May need diverting colostomy at some point however general surgery is awaiting palliative discussions prior to bringing this up to pt/family. --continue frequent repositioning. wound care/dressing  changes --Pain management: oxycodone 10mg  q6h, prn fentanyl 155mcg with dressing changes --PT/OT to work on mobility  Type 2 Diabetes. CBGs at goal. Continue levemir 30U BID. Glucose range goal: 120-180. Continue novolog 7U q4h and resistant SSI  High risk malnutrition. Tube feeds via cortrak at this time. Will ultimately need a PEG which would ideally be placed at time of diverting colostomy if it is determined necessary. Will try to touch base with general surgery to discuss.  Cholestasis of critical illness. Confirmed with elevated GGT. Hypothyroidism. Continue synthroid.  Goals of care. Overall poor prognosis. Will not be a candidate for outpatient HD unless she is able to sit in HD chair. Greatly appreciate palliative care's assistance with GOC and pain management Family communication: based on the palliative note from 11/16, it is implied that Wanda Ramirez is upset that she is speaking to a nurse twice a day but she feels she needs information from MDs directly. Having cared for this patient since 11/11, I can confidently say that I have spoken with [facility] daily with the exception of her recent ICU stay. I have encouraged her to reach out if she had any questions each time. I am happy to continue to update her daily however it may be beneficial to the family dynamics if they are able to conference call in all members that want to be updated as it is not reasonable to be reaching out to multiple family members each day or to be calling in updates more than once a day (barring an acute event).  Overall, I suspect family is frustrated at the situation more so.  Best practice:  CODE STATUS: DNR Diet: tube feeds DVT for prophylaxis: heparin Social considerations/Family communication: will update daughter later Dispo: pending PEG placement   Mitzi Hansen, MD Internal Medicine Resident PGY-2 [facility] Pager: [phone] 04/18/2020 6:42 AM    Contact info:   Please page me at [phone] from 7am-5pm M-F.  Please use the IMTS after hours pager at [phone] after 5pm M-F and on weekends

## 2020-04-19 ENCOUNTER — Encounter (HOSPITAL_COMMUNITY): Payer: Self-pay | Admitting: Student in an Organized Health Care Education/Training Program

## 2020-04-19 DIAGNOSIS — J9601 Acute respiratory failure with hypoxia: Secondary | ICD-10-CM | POA: Diagnosis not present

## 2020-04-19 DIAGNOSIS — E1169 Type 2 diabetes mellitus with other specified complication: Secondary | ICD-10-CM | POA: Diagnosis not present

## 2020-04-19 DIAGNOSIS — L03317 Cellulitis of buttock: Secondary | ICD-10-CM | POA: Diagnosis not present

## 2020-04-19 DIAGNOSIS — U071 COVID-19: Secondary | ICD-10-CM | POA: Diagnosis not present

## 2020-04-19 LAB — GLUCOSE, CAPILLARY
Glucose-Capillary: 175 mg/dL — ABNORMAL HIGH (ref 70–99)
Glucose-Capillary: 200 mg/dL — ABNORMAL HIGH (ref 70–99)
Glucose-Capillary: 190 mg/dL — ABNORMAL HIGH (ref 70–99)
Glucose-Capillary: 175 mg/dL — ABNORMAL HIGH (ref 70–99)
Glucose-Capillary: 188 mg/dL — ABNORMAL HIGH (ref 70–99)
Glucose-Capillary: 162 mg/dL — ABNORMAL HIGH (ref 70–99)
Glucose-Capillary: 164 mg/dL — ABNORMAL HIGH (ref 70–99)

## 2020-04-19 LAB — CBC
HCT: 27.7 % — ABNORMAL LOW (ref 36.0–46.0)
Hemoglobin: 9 g/dL — ABNORMAL LOW (ref 12.0–15.0)
MCH: 30 pg (ref 26.0–34.0)
MCHC: 32.5 g/dL (ref 30.0–36.0)
MCV: 92.3 fL (ref 80.0–100.0)
Platelets: 338 10*3/uL (ref 150–400)
RBC: 3 MIL/uL — ABNORMAL LOW (ref 3.87–5.11)
RDW: 14.2 % (ref 11.5–15.5)
WBC: 10.9 10*3/uL — ABNORMAL HIGH (ref 4.0–10.5)
nRBC: 0 % (ref 0.0–0.2)

## 2020-04-19 MED ORDER — CHLORHEXIDINE GLUCONATE CLOTH 2 % EX PADS
6.0000 | MEDICATED_PAD | Freq: Every day | CUTANEOUS | Status: DC
  Administered 2020-04-19 – 2020-05-15 (×27): 6 via TOPICAL

## 2020-04-19 MED ORDER — QUETIAPINE FUMARATE 50 MG PO TABS
50.0000 mg | ORAL_TABLET | Freq: Two times a day (BID) | ORAL | Status: DC
  Administered 2020-04-19 – 2020-04-30 (×21): 50 mg
  Filled 2020-04-19 (×20): qty 1

## 2020-04-19 NOTE — TOC Progression Note (Signed)
Transition of Care [facility]) - Progression Note    Patient Details  Name: Wanda Ramirez MRN: [id number] Date of Birth: 1970/07/05  Transition of Care [facility]) CM/SW [place], RN Phone Number: 04/19/2020, 4:41 PM  Clinical Narrative:    Day 2 palliative spoke to daughter and patient about long term recovery. Patient had a sacral wound that is tunnelling, most likely she would be in SNF for moths, needing care to the wound, she also needs Chronic dialysis the combination is a difficult placement, may even be far away from the area. The patient does not want placement, even if it means death. The daughter stated she felt like the care team was pushing withdrawal. And comfort care. The patient was and still is critical. Day 65 in the hospital of [place]. ...........................................................Marland KitchenMarland KitchenWe had a prolonged discussion about these complex clinical issues and went over the various important aspects to consider. All questions were answered. COVID effects.  Continuing conversations will ensue, as it seems the patient has different views than the daughter, and the patient is alert and has the right to make choices affecting her care. Discharge likely if the patient directs to a Long term SNF where she can get dialysis., or hospice care residential or at home. Both of these will have to be explored in depth with a full understanding.  .    Expected Discharge Plan: Home/Self Care Barriers to Discharge: Continued Medical Work up  Expected Discharge Plan and Services Expected Discharge Plan: Home/Self Care   Discharge Planning Services: CM Consult   Living arrangements for the past 2 months: Apartment                                       Social Determinants of Health (SDOH) Interventions    Readmission Risk Interventions No flowsheet data found.

## 2020-04-19 NOTE — Progress Notes (Signed)
Nutrition Follow-up  DOCUMENTATION CODES:   Morbid obesity  INTERVENTION:   Continue tube feeds via Cortrak:  - Nepro at 50 ml/hr (1200 ml/day) - ProSource TF 45 ml TID  Tube feeding regimen at goal provides 2280 kcal, 130 grams of protein, and 872 ml of free water daily.  Continue Juven BID via tube, each packet provides 80 calories, 8 grams of carbohydrate, 2.5 grams of protein (collagen), 7 grams of L-arginine and 7 grams of L-glutamine; supplement contains CaHMB, Vitamins C, E, B12 and Zinc to promote wound healing.  NUTRITION DIAGNOSIS:   Inadequate oral intake related to inability to eat as evidenced by NPO status.  Ongoing  GOAL:   Patient will meet greater than or equal to 90% of their needs  Met with TF  MONITOR:   Vent status, Labs, Weight trends, TF tolerance, Skin, I & O's  REASON FOR ASSESSMENT:   Consult Assessment of nutrition requirement/status  ASSESSMENT:   49 year old female with a past medical history significant for end-stage renal disease on hemodialysis MWF, chronic hypoxic respiratory failure on 3 L nasal cannula at baseline, sleep apnea, diabetes, hypertension, hyperlipidemia, hypothyroidism, and asthma presents with complaints of worsening shortness of breath. Previously diagnosed with COVID PNA on 9/9.  Patient is receiving hydrotherapy for necrotizing soft tissue infection.  Patient remains on trach collar.  Cortrak in place. Has not received a PEG yet.  Last HD 11/16 with 2 L UF. SLP is following for PMV use.  Patient is currently receiving Nepro via Cortrak at 50 ml/h (1200 ml/day) with Prosource TF 45 ml TID to provide 2280 kcals, 130 gm protein, 872 ml free water daily.  Free water flushes 20 ml every 6 hours for a total of 952 ml per day.  Admit weight: 124.7 kg Current weight: 102.8 kg EDW (per nephrology): 111.5 kg, currently below this weight, need new EDW  Medications reviewed and include: B-complex with vitamin C, Aranesp,  Novolog SSI q 4 hours, Novolog 7 units q 4 hours, Levemir, Rena-vit, Juven.  Labs reviewed. Phos 7 (H) CBG's: 175-200-190  I/O +25 L since admission Anuric  Diet Order:   Diet Order            Diet NPO time specified  Diet effective now                 EDUCATION NEEDS:   Not appropriate for education at this time  Skin:  Skin Assessment: Skin Integrity Issues: Stage II: right neck Unstageable: mid sacrum Incisions: buttocks  Last BM:  11/17  Height:   Ht Readings from Last 1 Encounters:  04/05/20 '5\' 2"'  (1.575 m)    Weight:   Wt Readings from Last 1 Encounters:  06/25/18 124.7 kg    Ideal Body Weight:  50 kg  BMI:  Body mass index is 41.45 kg/m.  Estimated Nutritional Needs:   Kcal:  2100-2300  Protein:  120-140 grams  Fluid:  1 L + UOP   Lucas Mallow, RD, LDN, CNSC Please refer to Amion for contact information.

## 2020-04-19 NOTE — Progress Notes (Signed)
Responded to page to assist patient daughter with paper work.  Could not help with paperwork due to the nature of document.  Paperwork had to do with adding other to patient  [place] and giving others permission to live there and pay her bills.I instructed daughter to seek other witnesses  another avenue and to get document  Notarized.   Jaclynn Major, [place], [facility], Pager [phone]

## 2020-04-19 NOTE — Progress Notes (Signed)
Daily Progress Note   Patient Name: Wanda Ramirez       Date: 04/19/2020 DOB: 01/08/1971  Age: 49 y.o. MRN#: [id number] Attending Physician: Wanda Kilts, MD Primary Care Physician: Wanda Roche, NP Admit Date: 02/13/2020  Reason for Consultation/Follow-up: To discuss complex medical decision making related to patient's goals of care  Subjective: Spoke with patient at bedside.  Asked several questions to which she nodded yes or no appropriately.  She seems alert and orientated. Mentioned to patient that because of the wound she would likely have a long recovery with months in a nursing facility after this hospitalization.  Patient shook her head "no".  I asked if it meant not dying would she be willing to go thru recovery for an extended period of time in a nursing facility - patient shook her head "no".  I was rather surprised by this information and simply provided reassurance that we will keep doing everything we can do to improve her health and take it one day at a time.  Together with Dr. Darrick Ramirez, we spoke on the phone with Wanda Ramirez.  Provided daily update including information from the Wanda Ramirez note that her mother does have a "new tunnel" in her wound.  Surgery is following to assess whether or not further surgery would benefit her.   Dr. Darrick Ramirez did an excellent job of ensuring that Wanda Ramirez understood what a new tunnel is the wound means.    Wanda Ramirez brought up the subject of the patient's quality of life and I was able to share that her mother indicated to me she did not want to go thru extended rehab in a nursing facility.  Wanda Ramirez candidly expressed that she felt (1) her mother is not ready to die and (2) that she felt the doctors had been pushing the family to let her die since she  had been moved to ICU.  She gave [place] her honest feeling that she did not appreciate it.   I explained that our ultimate desire is to heal her mother.  We do not want Ramirez deaths or chronically ill patients, but we also need to be honest with family about what we are seeing even if it is not what they want to hear.  Dr. Darrick Ramirez shared that the term "physician" means healer, but we have to be  honest about what we are seeing medically.  I expressed that if Wanda Ramirez and the family have a realistic understanding about what is going on then they can help make good decisions for Wanda Ramirez.  As long as they have that realistic understanding then I'm in the boat with them focused on how we can best heal her.    Wanda Ramirez was accepting of the conversation and we committed to keep her updated.  We ended the call on a positive note talking about the strength [name] is developing in her voice and that perhaps Wanda Ramirez will be hearing her voice soon.   Assessment: 49 yo female ESRD, post COVID now with trach, battling a necrotizing skin infection which has created a large unstageable wound.  I'm concerned that given her nutritional status her wound is unlikely to ever heal.  Patient is unlikely to be able to "sit" for hemodialysis outpatient and is likely facing many months in a long term care facility.  Patient Profile/HPI:   49 y.o. female  with past medical history of ESRD, DM, OSA, chronic back pain, asthma, arthritis and morbid obesity who was admitted on 02/13/2020 with COVID 19.  She has had a long Ramirez course including intubation and reintubation, tracheostomy, large unstageable sacral wound with necrotizing soft tissue infection status post OR debridement.  Last night (11/14) she suffered arterial hemorrhage from her wound and was transfused.   Length of Stay: 65   Vital Signs: BP 112/78   Pulse (!) 106   Temp 98.5 F (36.9 C) (Oral)   Resp 20   Ht 5\' 2"  (1.575 m)   Wt 102.8 kg   SpO2 98%    BMI 41.45 kg/m  SpO2: SpO2: 98 % O2 Device: O2 Device: Tracheostomy Collar O2 Flow Rate: O2 Flow Rate (L/min): 5 L/min       Palliative Assessment/Data: 20%     Palliative Care Plan    Recommendations/Plan: Continue current care.  Family desires full scope treatment.  We have discussed her difficult future. Family may benefit from a conversation with Surgery MD at some point when appropriate (wound healing prognostication) PMT will continue to check in once or twice a week with the family.  Please call us if our services are needed more urgently.  Code Status:  DNR  Prognosis:  Unable to determine   Discharge Planning: To Be Determined likely a facility that can provide dialysis in the bed and extensive wound care & hydrotherapy.  Care plan was discussed with Wanda Ramirez and Dr. Darrick Ramirez.  Thank you for allowing the Palliative Medicine Team to assist in the care of this patient.  Total time spent:  35 min.     Greater than 50%  of this time was spent counseling and coordinating care related to the above assessment and plan.  Wanda Jenny, PA-C Palliative Medicine  Please contact Palliative MedicineTeam phone at [phone] for questions and concerns between 7 am - 7 pm.   Please see AMION for individual provider pager numbers.

## 2020-04-19 NOTE — Progress Notes (Signed)
Physical Therapy Wound Treatment Patient Details  Name: Wanda Ramirez MRN: [id number] Date of Birth: 29-Jul-1970  Today's Date: 04/19/2020 Time: 7616-0737 Time Calculation (min): 49 min  Subjective  Subjective: Nodding head in agreement to PT Patient and Family Stated Goals: None stated at this time.  Date of Onset:  (Unknown) Prior Treatments: Dressing changes  Pain Score:  Unable to rate pain, she was premedicated and repeatedly asked during session if she needed more meds but denied.  Pt with grimacing with packing removal but tolerated other well and sleeping toward end of session.   Wound Assessment  Pressure Injury 03/06/20 Sacrum Mid Unstageable - Full thickness tissue loss in which the base of the injury is covered by slough (yellow, tan, gray, green or brown) and/or eschar (tan, brown or black) in the wound bed. (Active)  Dressing Type ABD;Barrier Film (skin prep);Gauze (Comment);Non adherent (xeroform) 04/19/20 1336  Dressing Changed;Clean;Dry;Intact 04/19/20 1336  Dressing Change Frequency Daily 04/19/20 1336  State of Healing Early/partial granulation 04/19/20 1336  Site / Wound Assessment Brown;Yellow;Red;Other (Comment) 04/19/20 1336  % Wound base Red or Granulating 65% 04/19/20 1336  % Wound base Yellow/Fibrinous Exudate 10% 04/19/20 1336  % Wound base Black/Eschar 15% 04/19/20 1336  % Wound base Other/Granulation Tissue (Comment) 10% 04/19/20 1336  Peri-wound Assessment Intact 04/19/20 1336  Wound Length (cm) 16.5 cm 04/14/20 1458  Wound Width (cm) 20.3 cm 04/14/20 1458  Wound Depth (cm) 6 cm 04/14/20 1458  Wound Surface Area (cm^2) 334.95 cm^2 04/14/20 1458  Wound Volume (cm^3) 2009.7 cm^3 04/14/20 1458  Tunneling (cm) 11.8 at 8:00, 4.2 at 5:00 04/18/20 1359  Undermining (cm) 6.7 cm from 6:00-8:00 04/14/20 1458  Margins Unattached edges (unapproximated) 04/19/20 1336  Drainage Amount Copious 04/19/20 1336  Drainage Description Serosanguineous 04/19/20 1336   Treatment Debridement (Selective);Hydrotherapy (Pulse lavage);Packing (Saline gauze); Santyl applied to wound bed prior to applying dressing.  04/19/20 1336      Hydrotherapy Pulsed lavage therapy - wound location: sacrum Pulsed Lavage with Suction (psi): 12 psi Pulsed Lavage with Suction - Normal Saline Used: 1000 mL Pulsed Lavage Tip: Tip with splash shield Selective Debridement Selective Debridement - Location: sacrum Selective Debridement - Tools Used: Forceps;Scissors;Scalpel Selective Debridement - Tissue Removed: Yellow necrotic tissue from 5:00 area and necrotic adipose from flap   Wound Assessment and Plan  Wound Therapy - Assess/Plan/Recommendations Wound Therapy - Clinical Statement: Pt tolerated hydrotherapy well.  She did have pain with packing removal - added non-adherant , xeroform, to aide with removal next visit. Able to debride large amount of necrosis from 5:00 wound base and from flap.  Granulating well.  Will continue to benefit from debridement and hydrotherapy for removal of unviable tissues, to decrease bioburden, and promote wound bed healing Wound Therapy - Functional Problem List: Global weakness in the setting of COVID and extended hospital stay. Factors Delaying/Impairing Wound Healing: Incontinence;Infection - systemic/local;Immobility;Multiple medical problems;Polypharmacy;Diabetes Mellitus Hydrotherapy Plan: Debridement;Dressing change;Patient/family education;Pulsatile lavage with suction Wound Therapy - Frequency: 6X / week Wound Therapy - Follow Up Recommendations: Skilled nursing facility Wound Plan: See above  Wound Therapy Goals- Improve the function of patient's integumentary system by progressing the wound(s) through the phases of wound healing (inflammation - proliferation - remodeling) by: Decrease Necrotic Tissue to: 0 Decrease Necrotic Tissue - Progress: Progressing toward goal Increase Granulation Tissue to: 100% Increase Granulation Tissue  - Progress: Progressing toward goal Improve Drainage Characteristics: Min;Serous Improve Drainage Characteristics - Progress: Progressing toward goal Goals/treatment plan/discharge plan were made with and agreed  upon by patient/family: Yes Time For Goal Achievement: 7 days Wound Therapy - Potential for Goals: Excellent  Goals will be updated until maximal potential achieved or discharge criteria met.  Discharge criteria: when goals achieved, discharge from hospital, MD decision/surgical intervention, no progress towards goals, refusal/missing three consecutive treatments without notification or medical reason.  GP    Abran Richard, PT Acute Rehab Services Pager [phone] [facility] [place] 04/19/2020, 1:45 PM

## 2020-04-19 NOTE — Progress Notes (Signed)
Physical Therapy Treatment Patient Details Name: Wanda Ramirez MRN: [id number] DOB: 01-16-1971 Today's Date: 04/19/2020    History of Present Illness 49 yo admitted 9/13 with Covid PNA, intubated 9/24-10/6, reintubated 10/8, CRRT 9/26-10/7, trach and bronch 10/18. Pt returned to vent 10/30-11/7. Pt with significant sacral wound to bone s/p I&D x 2. Pt with hemorrhage from wound 11/14, hemorrhagic shock 11/15. PMhx: ESRD TTS, obesity, HTN, GERD, hypothyroidism, DM, asthma    PT Comments    Pt restless in bed on arrival wanting to move and shift and stating desire to sit up. Pt able to perform rolling and sitting with decreased assist today without sacral pain. Pt transferred to kreg tilt bed and initiated tilting today. Pt tolerated 15 degrees well with BP 124/77 however at 30degrees pt unable to activate right quadricep into standing, reported leg pain and was hyperextending left knee with BP 112/78 only tolerating 2 min. Pt educated for continued need to progress standing tolerance, strength and balance. Will continue to follow. PT in full sidelying on right with HOB elevated 30 degrees end of session.   SpO2 96% on trach collar 28% fiO2 HR 108    Follow Up Recommendations  LTACH;Supervision/Assistance - 24 hour     Equipment Recommendations  Wheelchair (measurements PT);Wheelchair cushion (measurements PT);Hospital bed;Other (comment)    Recommendations for Other Services       Precautions / Restrictions Precautions Precautions: Fall;Other (comment) Precaution Comments: trach, cortrak, flexiseal, sacral wound Other Brace: bil prevalons (has a pressure blood blister on the back of her R heel.     Mobility  Bed Mobility Overal bed mobility: Needs Assistance Bed Mobility: Rolling;Sidelying to Sit Rolling: Min assist Sidelying to sit: Mod assist       General bed mobility comments: rolling bil for pad placement and reinforcement of dressing. side to sitting with mod assist  to bring legs off bed and elevate trunk. total +2 to slide toward [facility] or foot of bed for tilting  Transfers Overall transfer level: Needs assistance               General transfer comment: from EOB lifted via maxisky to switch beds to kreg tilt bed. Pt then able to tilt to 30 degrees with cues and assist for right knee extension, limited by fatigue  Ambulation/Gait                 Stairs             Wheelchair Mobility    Modified Rankin (Stroke Patients Only)       Balance Overall balance assessment: Needs assistance Sitting-balance support: Feet supported;Bilateral upper extremity supported Sitting balance-Leahy Scale: Fair Sitting balance - Comments: EOB 3 min with no UE or LE support with pt able to maintain trunk control and perform bil lateral weight shifts for lift pad placement     Standing balance-Leahy Scale: Zero Standing balance comment: in 30 degree tilt pt with continued right lean and assist to correct to midline                            Cognition Arousal/Alertness: Awake/alert Behavior During Therapy: Flat affect Overall Cognitive Status: Difficult to assess Area of Impairment: Attention;Following commands;Safety/judgement                   Current Attention Level: Sustained Memory: Decreased short-term memory Following Commands: Follows one step commands inconsistently;Follows one step commands with increased time Safety/Judgement:  Decreased awareness of safety;Decreased awareness of deficits   Problem Solving: Requires verbal cues;Requires tactile cues General Comments: pt stating no pain but with tilting when cued would state pain in knees, grimace with maxisky lift      Exercises General Exercises - Lower Extremity Long Arc Quad: AROM;Both;10 reps;Seated    General Comments        Pertinent Vitals/Pain Pain Assessment: Faces Pain Score: 4  Pain Location: LEs with certain movements Pain Descriptors /  Indicators: Aching;Sore Pain Intervention(s): Limited activity within patient's tolerance;Monitored during session;Repositioned    Home Living                      Prior Function            PT Goals (current goals can now be found in the care plan section) Progress towards PT goals: Progressing toward goals    Frequency    Min 3X/week      PT Plan Current plan remains appropriate;Frequency needs to be updated    Co-evaluation              AM-PAC PT "6 Clicks" Mobility   Outcome Measure  Help needed turning from your back to your side while in a flat bed without using bedrails?: A Little Help needed moving from lying on your back to sitting on the side of a flat bed without using bedrails?: A Lot Help needed moving to and from a bed to a chair (including a wheelchair)?: Total Help needed standing up from a chair using your arms (e.g., wheelchair or bedside chair)?: Total Help needed to walk in hospital room?: Total Help needed climbing 3-5 steps with a railing? : Total 6 Click Score: 9    End of Session   Activity Tolerance: Patient tolerated treatment well Patient left: in bed;with call bell/phone within reach;with nursing/sitter in room Nurse Communication: Mobility status;Need for lift equipment;Precautions PT Visit Diagnosis: Muscle weakness (generalized) (M62.81);Difficulty in walking, not elsewhere classified (R26.2);Pain     Time: 3419-3790 PT Time Calculation (min) (ACUTE ONLY): 51 min  Charges:  $Therapeutic Activity: 38-52 mins                     Bert Ptacek P, PT Acute Rehabilitation Services Pager: [phone] Office: [place] 04/19/2020, 10:44 AM

## 2020-04-19 NOTE — Progress Notes (Signed)
NAME:  Wanda Ramirez, MRN:  [id number], DOB:  Mar 20, 1971, LOS: 92 ADMISSION DATE:  02/13/2020   Interm history/ Subjective   No significant overnight events. General surgery note reviewed--no new changes Nephrology note reviewed--no new changes  Pt able to vocalize answers to questions. Significant Hospital Events   9/14 Admission to general floor 9/23 Tunneled HD cath placed 9/26 Transferred to ICU for hypotension and CRRT 10/1 On CRRT and vasopressors 10/6 Extubated 10/18 tracheostomy performed 10/31 Restart vanc for staph in resp culture 11/2 Surgical debridement for worsening sacral wound 11/10 IMTS to assume care 11/11 febrile. merrem started. Surgery reconsulted 11/13 merrem stopped 11/14 hemodynamic significant sacral wound bleeding. Surgery placed stitch. 11/15 tx to ICU for hemorrhagic shock. Started on pressors. 11/17 transferred back to IMTS  Objective   Blood pressure 106/65, pulse (!) 118, temperature 99.7 F (37.6 C), temperature source Oral, resp. rate 15, height 5\' 2"  (1.575 m), weight 102.8 kg, SpO2 94 %.     Intake/Output Summary (Last 24 hours) at 04/19/2020 0727 Last data filed at 04/18/2020 1800 Gross per 24 hour  Intake 550 ml  Output 1005 ml  Net -455 ml   Filed Weights   04/13/20 0725 04/13/20 1117 04/16/20 1630  Weight: 106 kg 102.8 kg 102.8 kg    Examination: General: chronically ill appearing but in NAD HEENT: tracheostomy present. Able to speak single words Pulm: remains on 5L via trach mask. Lungs clear throughout GI: BS present. No abdominal tenderness  Consults:  Nephrology General surgery Palliative care Pulm  Significant Diagnostic Tests:  9/13 CXR>> bilateral patchy airspace opacities 9/21 VQ scan>> no PE 9/29 echocardiogram>> normal LVEF, findings consistent with increased RV volume and fluid overload, moderately reduced RV function 10/28 CT abdomen/pelvis >>scattered ground glass and consolidative opacities in the lung  bases. Cystic space in the anterior, subpleural RML with septations favoring complicated pneumatocele. Non-obstructive punctuate nephrolithiasis 11/1 CT abdomen/pelvis>> persistent multifocal bibasilar airspace disease with scarring. Bullous changes seen in RML. Decubitus ulcer overlying the sacrococcygeal junction with extensive gas seen in the left gluteal region involving the muscle and fat. No evidence of abscess. Findings consistent with cellulitis and myositis. No evidence of body destruction to suggest osteomyelitis.  Micro Data:  9/13, 9/26, 10/8, 10/24, 11/2, 11/9 blood cultures>>no growth 10/29 resp culture>> MSSA, resistant to clinda, erythromycin, clinda 11/2 wound culture>> e.coli resistant to ampicillin, cephalosporins, bactrim, e. Faecalis pan sensitive  Antimicrobials:  Remdesivir 9/14>>9/19 Cefepime 9/20>>9/29 Azithromycin 9/20>>9/25 Zosyn 10/8, 11/1>11/8 Vanc 10/8, 10/31>>11/5 Rocephin>>10/10>>10/16 Merrem 11/11>>11/13  Procedures/lines  Tracheostomy shiley 6.0 XLT 10/18 Sacral wound debridement--11/2  Tunneled HD line 9/27>> Central line 11/2>> Rectal tube 10/2>> Cortrak 10/4>>  Central line 9/26-10/24 A-line 9/30>>10/13, 11/2-11/9 HD line 9/22-9/27 ETT 9/26-10/6 ETT 10/8-10/18  Labs    CBC Latest Ref Rng & Units 04/18/2020 04/17/2020 04/16/2020  WBC 4.0 - 10.5 K/uL 12.4(H) 10.5 10.9(H)  Hemoglobin 12.0 - 15.0 g/dL 7.8(L) 8.1(L) 7.0(L)  Hematocrit 36 - 46 % 24.2(L) 24.2(L) 20.8(L)  Platelets 150 - 400 K/uL 283 264 289   BMP Latest Ref Rng & Units 04/18/2020 04/17/2020 04/16/2020  Glucose 70 - 99 mg/dL 173(H) 120(H) 226(H)  BUN 6 - 20 mg/dL 67(H) 40(H) 101(H)  Creatinine 0.44 - 1.00 mg/dL 4.39(H) 3.02(H) 5.60(H)  Sodium 135 - 145 mmol/L 135 135 130(L)  Potassium 3.5 - 5.1 mmol/L 3.9 3.0(L) 5.8(H)  Chloride 98 - 111 mmol/L 97(L) 96(L) 95(L)  CO2 22 - 32 mmol/L 24 26 17(L)  Calcium 8.9 - 10.3 mg/dL  9.4 9.3 9.0    Summary  25 yof admitted 02/17/20 for  COVID-19 pneumonia who developed chronic respiratory failure requiring a tracheostomy after an extended stay in ICU. Her hospitalization has been complicated by MSSA pneumonia as well as the development of a necrotizing soft tissue infection involving her gluteus secondary to MDR organisms. She was transferred to the IMTS 11/10 for ongoing care while awaiting the placement of a PEG tube.   We are continuing goals of care discussions that are being led by the palliative care team  Assessment & Plan:  Principal Problem:   COVID-19 Active Problems:   End stage renal disease on dialysis [facility])   Diabetes mellitus type 2 in obese (HCC)   OSA (obstructive sleep apnea)   HCAP (healthcare-associated pneumonia)   Acute respiratory failure with hypoxemia (HCC)   Encephalopathy acute   ARDS (adult respiratory distress syndrome) (HCC)   Pressure injury of skin   Aspiration into airway   Status post tracheostomy ([place])   Fever   Septic shock (HCC)   Cellulitis of buttock   Palliative care encounter   Sacral wound  ICU delirium --continue seroquel, klonopin. May start to try weaning in the next day or two. --delirium precautions.   ESRD on iHD MWF. AVF clotted off resulting in need for use of tunneled HD line. On midodrine 10 mg TID --Management per nephrology.   Acute blood loss anemia. Morning lab pending Anemia of CKD/critical illness . On aranesp, midodrine.  --daily CBC Leukocytosis. Low suspicion for active infection. No systemic signs. White count is down quite a bit from last week which is additionally reassuring. Off merrem since 11/12.  I suspect that she may continue to have a leukocytosis that is reactive to her sacral ulcer. No further workup at this time.  Chronic hypoxic respiratory failure s/p tracheostomy secondary to COVID 19 pneumonia complicated by MSSA pneumonia--unable to be weaned from vent. Stable on trach collar on 5L  6.0 shiley XLT cuffless.  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 Unfortunately, unlikely to heal until more mobile and is high risk for infection Multi-drug resistant bacteria as noted on prior wound culture.   Plan --no definitive plans for debridement from surgery. Hydrotherapy per their recommendations. May need diverting colostomy at some point. --Spoke with ID--they do not feel any clear indication for starting antibiotics at this time. Recommend obtaining deep wound cultures if debridement is planned in the upcoming days. Alternatively, although less favorable, could consider obtaining wound cultures from exposed areas. --continue frequent repositioning. wound care/dressing changes --Pain management: oxycodone 10mg  q6h, prn fentanyl 120mcg with dressing changes --PT/OT to work on mobility  Type 2 Diabetes. CBGs at goal. Continue levemir 30U BID. Glucose range goal: 120-180. Continue novolog 7U q4h and resistant SSI  High risk malnutrition. Tube feeds via cortrak at this time. Will ultimately need a PEG which would ideally be placed at time of diverting colostomy if it is determined necessary. May consider speaking to IR in the next day or two since there is no clear indication of acute infection right now.  Cholestasis of critical illness. Confirmed with elevated GGT. Hypothyroidism. Continue synthroid.  Goals of care. Next of kin is [name], pt's daughter. Appreciate palliative care's assistance. Planning for conference call with [name] and pt's sister later today in conjunction with palliative care team.  Best practice:  CODE STATUS: DNR Diet: tube  feeds DVT for prophylaxis: heparin Social considerations/Family communication: family conferences planned today in conjunction with palliatve care. Dispo: pending PEG placement   Mitzi Hansen, MD Internal Medicine Resident PGY-2 [facility]  Residency Pager: [phone] 04/19/2020 7:27 AM    Contact info:  Please page me at [phone] from 7am-5pm M-F.  Please use the IMTS after hours pager at [phone] after 5pm M-F and on weekends

## 2020-04-19 NOTE — Progress Notes (Signed)
[facility] Progress Note   OP HD:MWF here (was TTS at OP unit) 4h 18mn 450/800 2/2.25 bath 111.5kg Hep 2000 L AVF - darbe 50 ug q week, last 9/7 - calc 1.0 tiw - 9/9 Hb 10.0, tsat 22%  49yo female w/ ESRD on TTS HD admitted for COVID PNA on 02/13/20. She was intubated. SP CRRT around 9/26- 10/7. Now is on intermittent HD MWF here. She is sp trach. She had MSSA pna. DM2.Then she had large sacral decub requiring I&D.   Assessment/ Plan:   1. COVID pna /sp trach - off vent on trach collar 2. Hypotension - resolved, remains off pressors 3. Sacral decub w/ necrosis - sp I&D 11/2 and again 11/5 by gen surg. Surgery following. SP course of IV abx completed 11/12  4. ESRD -getting HD MWF here now.sp CRRT 9/26- 10/7. HD tomorrow 5. Volume - no edema on exam now, low UF 1-1.5 L as tol 4. HD access: LUA AVF clotted while here. TDC x2 here per IR 5. Anemia ckd/ critical illness - on darbe 150 weekly, 11/12 Ferritin >4000, iron sat 11.  With active infection and ^^ ferritin no IV iron. tx prbc's PRN 6. MBD ckd - Ca and phos in range, binders dc'd. Phos 3.9 10/30 > 9.2 11/8 --> 6.5.  RD switched to nepro  7. Nutrition - getting NG tube feeds, rena-vite 8. S/P MSSA HCAP/ VAP 9. DM2 - per pmd  RKelly Splinter MD 04/19/2020, 1:08 PM    Subjective:   No new problems today. Remains off pressors and afebrile.     Objective:   BP 112/78   Pulse (!) 106   Temp 98.5 F (36.9 C) (Oral)   Resp 20   Ht 5' 2" (1.575 m)   Wt 102.8 kg   SpO2 98%   BMI 41.45 kg/m   Intake/Output Summary (Last 24 hours) at 04/19/2020 1308 Last data filed at 04/19/2020 1130 Gross per 24 hour  Intake 1550 ml  Output 1005 ml  Net 545 ml   Weight change:   Physical Exam: Awake, responsive, NG w/ tube feeds, trach w/ trach collar O2 Lungs - clear bilat  CVS- reg w/o RG ABD- BS present soft non-distended  EXT- no sig hip / UE/ LE edema, lt arm access thrombosed ACCESS: [facility]  TC  Imaging: No results found.  Labs: BMET Recent Labs  Lab 04/13/20 0500 04/14/20 0719 04/15/20 0449 04/16/20 0400 04/17/20 0744 04/18/20 0818  NA 138 135 132* 130* 135 135  K 4.8 4.0 4.1 5.8* 3.0* 3.9  CL 98 98 96* 95* 96* 97*  CO2 22 23 21* 17* 26 24  GLUCOSE 140* 158* 148* 226* 120* 173*  BUN 60* 45* 80* 101* 40* 67*  CREATININE 4.55* 3.42* 4.49* 5.60* 3.02* 4.39*  CALCIUM 9.4 9.4 9.1 9.0 9.3 9.4  PHOS  --  7.3* 8.9* 11.1* 5.9* 7.0*   CBC Recent Labs  Lab 04/16/20 0400 04/16/20 1556 04/17/20 0744 04/18/20 0818  WBC 20.9* 10.9* 10.5 12.4*  HGB 7.9* 7.0* 8.1* 7.8*  HCT 24.0* 20.8* 24.2* 24.2*  MCV 88.9 87.0 90.3 91.3  PLT 396 289 264 283    Medications:    . sodium chloride   Intravenous Once  . B-complex with vitamin C  1 tablet Per Tube Daily  . chlorhexidine gluconate (MEDLINE KIT)  15 mL Mouth Rinse BID  . Chlorhexidine Gluconate Cloth  6 each Topical Q0600  . Chlorhexidine Gluconate Cloth  6 each Topical  [id number]  . clonazepam  0.5 mg Per Tube TID  . darbepoetin (ARANESP) injection - DIALYSIS  150 mcg Intravenous Q Wed-HD  . feeding supplement (PROSource TF)  45 mL Per Tube TID  . free water  20 mL Per Tube Q6H  . hydrocerin   Topical Daily  . insulin aspart  0-20 Units Subcutaneous Q4H  . insulin aspart  7 Units Subcutaneous Q4H  . insulin detemir  30 Units Subcutaneous BID  . levothyroxine  125 mcg Per Tube Q0600  . midodrine  10 mg Per Tube TID WC  . multivitamin  1 tablet Oral QHS  . nutrition supplement (JUVEN)  1 packet Per Tube BID  . oxyCODONE  10 mg Per Tube Q6H  . pantoprazole sodium  40 mg Per Tube BID  . QUEtiapine  100 mg Per Tube BID  . sodium chloride flush  10-40 mL Intracatheter Q12H  . Thrombi-Pad  1 each Topical Once

## 2020-04-20 DIAGNOSIS — U071 COVID-19: Secondary | ICD-10-CM | POA: Diagnosis not present

## 2020-04-20 LAB — CBC
HCT: 26.5 % — ABNORMAL LOW (ref 36.0–46.0)
HCT: 27.1 % — ABNORMAL LOW (ref 36.0–46.0)
Hemoglobin: 8.4 g/dL — ABNORMAL LOW (ref 12.0–15.0)
Hemoglobin: 8.4 g/dL — ABNORMAL LOW (ref 12.0–15.0)
MCH: 29.7 pg (ref 26.0–34.0)
MCH: 29.3 pg (ref 26.0–34.0)
MCHC: 31.7 g/dL (ref 30.0–36.0)
MCHC: 31 g/dL (ref 30.0–36.0)
MCV: 93.6 fL (ref 80.0–100.0)
MCV: 94.4 fL (ref 80.0–100.0)
Platelets: 367 10*3/uL (ref 150–400)
Platelets: 329 10*3/uL (ref 150–400)
RBC: 2.83 MIL/uL — ABNORMAL LOW (ref 3.87–5.11)
RBC: 2.87 MIL/uL — ABNORMAL LOW (ref 3.87–5.11)
RDW: 13.8 % (ref 11.5–15.5)
RDW: 13.8 % (ref 11.5–15.5)
WBC: 12.9 10*3/uL — ABNORMAL HIGH (ref 4.0–10.5)
WBC: 11.8 10*3/uL — ABNORMAL HIGH (ref 4.0–10.5)
nRBC: 0 % (ref 0.0–0.2)
nRBC: 0 % (ref 0.0–0.2)

## 2020-04-20 LAB — GLUCOSE, CAPILLARY
Glucose-Capillary: 104 mg/dL — ABNORMAL HIGH (ref 70–99)
Glucose-Capillary: 145 mg/dL — ABNORMAL HIGH (ref 70–99)
Glucose-Capillary: 191 mg/dL — ABNORMAL HIGH (ref 70–99)
Glucose-Capillary: 108 mg/dL — ABNORMAL HIGH (ref 70–99)

## 2020-04-20 LAB — RENAL FUNCTION PANEL
Albumin: 2.1 g/dL — ABNORMAL LOW (ref 3.5–5.0)
Albumin: 2.2 g/dL — ABNORMAL LOW (ref 3.5–5.0)
Anion gap: 14 (ref 5–15)
Anion gap: 11 (ref 5–15)
BUN: 65 mg/dL — ABNORMAL HIGH (ref 6–20)
BUN: 51 mg/dL — ABNORMAL HIGH (ref 6–20)
CO2: 25 mmol/L (ref 22–32)
CO2: 27 mmol/L (ref 22–32)
Calcium: 9.8 mg/dL (ref 8.9–10.3)
Calcium: 9.4 mg/dL (ref 8.9–10.3)
Chloride: 98 mmol/L (ref 98–111)
Chloride: 98 mmol/L (ref 98–111)
Creatinine, Ser: 4.19 mg/dL — ABNORMAL HIGH (ref 0.44–1.00)
Creatinine, Ser: 3.43 mg/dL — ABNORMAL HIGH (ref 0.44–1.00)
GFR, Estimated: 12 mL/min — ABNORMAL LOW (ref >60)
GFR, Estimated: 16 mL/min — ABNORMAL LOW (ref >60)
Glucose, Bld: 132 mg/dL — ABNORMAL HIGH (ref 70–99)
Glucose, Bld: 147 mg/dL — ABNORMAL HIGH (ref 70–99)
Phosphorus: 5.1 mg/dL — ABNORMAL HIGH (ref 2.5–4.6)
Phosphorus: 4.2 mg/dL (ref 2.5–4.6)
Potassium: 4.3 mmol/L (ref 3.5–5.1)
Potassium: 4.1 mmol/L (ref 3.5–5.1)
Sodium: 137 mmol/L (ref 135–145)
Sodium: 136 mmol/L (ref 135–145)

## 2020-04-20 MED ORDER — HEPARIN SODIUM (PORCINE) 1000 UNIT/ML IJ SOLN
INTRAMUSCULAR | Status: AC
  Filled 2020-04-20: qty 5

## 2020-04-20 MED ORDER — HEPARIN SODIUM (PORCINE) 1000 UNIT/ML DIALYSIS: 2000.0000 [IU] | Freq: Once | INTRAMUSCULAR | Status: DC

## 2020-04-20 MED ORDER — IPRATROPIUM-ALBUTEROL 0.5-2.5 (3) MG/3ML IN SOLN
RESPIRATORY_TRACT | Status: AC
  Administered 2020-04-20: 3 mL
  Filled 2020-04-20: qty 3

## 2020-04-20 NOTE — Progress Notes (Signed)
NAME:  Wanda Ramirez, MRN:  [id number], DOB:  [birth year], LOS: 30 ADMISSION DATE:  02/13/2020   Interm history/ Subjective   No significant overnight events. PT note reviewed--appears she may have a new tunnel developing. Nephrology note reviewed--no significant changes  Discussed with palliative yesterday who notes that patient indicated to her that she would not want long term SNF and that she would rather pass away than go to one. I had a lengthy conversation, in conjunction with palliative care, with patient's daughter, Wanda Ramirez, last evening. Please see 11/18 palliative care note for details.  Discuss care plan with general surgery this morning who is hopeful that she may not need another debridement if hydrotherapy is able to clean up that area. I did ask about the "tunnel" as noted previously. Gen surg relays that this is not quite a tunnel but more so of a just an area of necrotic tissue. I relayed to them that Wanda Ramirez would like to chat with them for further care plan.  Seen in HD. No complaints this morning.   Significant Hospital Events   9/14 Admission to general floor 9/23 Tunneled HD cath placed 9/26 Transferred to ICU for hypotension and CRRT 10/1 On CRRT and vasopressors 10/6 Extubated 10/18 tracheostomy performed 10/31 Restart vanc for staph in resp culture 11/2 Surgical debridement for worsening sacral wound 11/10 IMTS to assume care 11/11 febrile. merrem started. Surgery reconsulted 11/13 merrem stopped 11/14 hemodynamic significant sacral wound bleeding. Surgery placed stitch. 11/15 tx to ICU for hemorrhagic shock. Started on pressors. 11/17 transferred back to IMTS  Objective   Blood pressure 111/84, pulse (!) 106, temperature 98.6 F (37 C), temperature source Oral, resp. rate 17, height 5\' 2"  (1.575 m), weight 102.8 kg, SpO2 100 %.     Intake/Output Summary (Last 24 hours) at 04/20/2020 0515 Last data filed at 04/20/2020 0452 Gross per 24 hour  Intake  1600 ml  Output 1000 ml  Net 600 ml   Filed Weights   04/13/20 0725 04/13/20 1117 04/16/20 1630  Weight: 106 kg 102.8 kg 102.8 kg    Examination: General: chronically ill appearing but in NAD HEENT: tracheostomy present.  Pulm: remains on 5L via trach mask. Lungs clear anteriorly.  Consults:  Nephrology General surgery Palliative care Pulm  Significant Diagnostic Tests:  9/13 CXR>> bilateral patchy airspace opacities 9/21 VQ scan>> no PE 9/29 echocardiogram>> normal LVEF, findings consistent with increased RV volume and fluid overload, moderately reduced RV function 10/28 CT abdomen/pelvis >>scattered ground glass and consolidative opacities in the lung bases. Cystic space in the anterior, subpleural RML with septations favoring complicated pneumatocele. Non-obstructive punctuate nephrolithiasis 11/1 CT abdomen/pelvis>> persistent multifocal bibasilar airspace disease with scarring. Bullous changes seen in RML. Decubitus ulcer overlying the sacrococcygeal junction with extensive gas seen in the left gluteal region involving the muscle and fat. No evidence of abscess. Findings consistent with cellulitis and myositis. No evidence of body destruction to suggest osteomyelitis.  Micro Data:  9/13, 9/26, 10/8, 10/24, 11/2, 11/9 blood cultures>>no growth 10/29 resp culture>> MSSA, resistant to clinda, erythromycin, clinda 11/2 wound culture>> e.coli resistant to ampicillin, cephalosporins, bactrim, e. Faecalis pan sensitive  Antimicrobials:  Remdesivir 9/14>>9/19 Cefepime 9/20>>9/29 Azithromycin 9/20>>9/25 Zosyn 10/8, 11/1>11/8 Vanc 10/8, 10/31>>11/5 Rocephin>>10/10>>10/16 Merrem 11/11>>11/13  Procedures/lines  Tracheostomy shiley 6.0 XLT 10/18 Sacral wound debridement--11/2  Tunneled HD line 9/27>> Central line 11/2>> Rectal tube 10/2>> Cortrak 10/4>>  Central line 9/26-10/24 A-line 9/30>>10/13, 11/2-11/9 HD line 9/22-9/27 ETT 9/26-10/6 ETT 10/8-10/18  Labs  CBC  Latest Ref Rng & Units 04/19/2020 04/18/2020 04/17/2020  WBC 4.0 - 10.5 K/uL 10.9(H) 12.4(H) 10.5  Hemoglobin 12.0 - 15.0 g/dL 9.0(L) 7.8(L) 8.1(L)  Hematocrit 36 - 46 % 27.7(L) 24.2(L) 24.2(L)  Platelets 150 - 400 K/uL 338 283 264   BMP Latest Ref Rng & Units 04/18/2020 04/17/2020 04/16/2020  Glucose 70 - 99 mg/dL 173(H) 120(H) 226(H)  BUN 6 - 20 mg/dL 67(H) 40(H) 101(H)  Creatinine 0.44 - 1.00 mg/dL 4.39(H) 3.02(H) 5.60(H)  Sodium 135 - 145 mmol/L 135 135 130(L)  Potassium 3.5 - 5.1 mmol/L 3.9 3.0(L) 5.8(H)  Chloride 98 - 111 mmol/L 97(L) 96(L) 95(L)  CO2 22 - 32 mmol/L 24 26 17(L)  Calcium 8.9 - 10.3 mg/dL 9.4 9.3 9.0    Summary  26 yof admitted 02/17/20 for COVID-19 pneumonia who developed chronic respiratory failure requiring a tracheostomy after an extended stay in ICU. Her hospitalization has been complicated by MSSA pneumonia as well as the development of a necrotizing soft tissue infection involving her gluteus secondary to MDR organisms. She was transferred to the IMTS 11/10 for ongoing care while awaiting the placement of a PEG tube.   We are continuing goals of care discussions that are being led by the palliative care team  Assessment & Plan:  Principal Problem:   COVID-19 Active Problems:   End stage renal disease on dialysis [facility])   Diabetes mellitus type 2 in obese (HCC)   OSA (obstructive sleep apnea)   HCAP (healthcare-associated pneumonia)   Acute respiratory failure with hypoxemia (HCC)   Encephalopathy acute   ARDS (adult respiratory distress syndrome) (HCC)   Pressure injury of skin   Aspiration into airway   Status post tracheostomy ([place])   Fever   Septic shock (HCC)   Cellulitis of buttock   Palliative care encounter   Sacral wound  ICU delirium --continue seroquel, klonopin. Decreased seroquel to 50mg  BID 11/18. --delirium precautions.   ESRD on iHD MWF. AVF clotted off resulting in need for use of tunneled HD line. On midodrine 10 mg  TID --Management per nephrology.   Acute blood loss anemia (resolved) Anemia of CKD/critical illness . Mild drop in hgb 9>8.4. On aranesp, midodrine.  --daily CBC  Chronic hypoxic respiratory failure s/p tracheostomy secondary to COVID 19 pneumonia complicated by MSSA pneumonia--unable to be weaned from vent. Stable on trach collar on 5L  6.0 shiley XLT cuffless.  Plan: continue trach care. Management per trach team.   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 Very mild leukocytosis on yesterday's labs. Remains afebrile without sign of systemic infection. Multi-drug resistant bacteria as noted on prior wound culture.   Plan --no definitive plans for debridement from surgery. Hydrotherapy per their recommendations. May need diverting colostomy at some point. --continue frequent repositioning. wound care/dressing changes --Pain management: oxycodone 10mg  q6h, prn fentanyl 140mcg with dressing changes --PT/OT to work on mobility  Type 2 Diabetes. CBGs at goal. Continue levemir 30U BID, novolog 7U every 4h and resistant SSI.   High risk malnutrition. Tube feeds via cortrak at this time. Will ultimately need a PEG which would ideally be placed at time of diverting colostomy if it is determined necessary.   Hypothyroidism. Continue synthroid.  Goals of care. Full scope of care for now. Continuing discussions with patient and daughter. Appreciate palliative's assistance.  Best practice:  CODE STATUS: DNR Diet: tube feeds DVT for prophylaxis: heparin Social considerations/Family communication: will update daughter Dispo: pending PEG  placement. Will need LTACH or skill facility that would perform HD.   Mitzi Hansen, MD Internal Medicine Resident PGY-2 [facility] Pager: [phone] 04/20/2020 5:15 AM    Contact info:  Please page me at [phone] from 7am-5pm M-F.  Please use the IMTS after  hours pager at [phone] after 5pm M-F and on weekends

## 2020-04-20 NOTE — Progress Notes (Signed)
SLP Cancellation Note  Patient Details Name: Wanda Ramirez MRN: [id number] DOB: 09/01/1970   Cancelled treatment:        In HD this morning and hydro therapy this afternoon.   [name] 04/20/2020, 2:33 PM

## 2020-04-20 NOTE — Progress Notes (Signed)
Progress Note  17 Days Post-Op  Subjective: Seen with PT hydrotherapy. Patient somewhat lethargic after dialysis but moving arms and intermittently uncomfortable during examination of wound.   Objective: Vital signs in last 24 hours: Temp:  [97.7 F (36.5 C)-98.7 F (37.1 C)] 98.6 F (37 C) (11/19 0750) Pulse Rate:  [36-113] 113 (11/19 1255) Resp:  [13-29] 16 (11/19 1255) BP: (94-133)/(55-93) 128/83 (11/19 1220) SpO2:  [71 %-100 %] 100 % (11/19 1255) FiO2 (%):  [28 %] 28 % (11/19 1255) Last BM Date: 04/19/20  Intake/Output from previous day: 11/18 0701 - 11/19 0700 In: 1600 [NG/GT:1600] Out: 1000 [Stool:1000] Intake/Output this shift: Total I/O In: -  Out: 849 [Other:849]  PE: Gen: Awake, NAD Pulm: Rate and effort normal  Wounds:Large sacral/left buttocks wound.Exposed sacrum in wound base with fibrinous exudate present. Healthy granulation tissue stable. Undermining extending left inferolaterally as seen in first photo, no purulence expressed from that tract but extends about 11 cm. Right lateral wound surface with less necrotic tissue and a pocket that extends about 3.5 cm as seen in 2nd photo. Inferior flap with eschar along distal edge. One area of bleeding controlled with silver nitrate applicators and direct pressure         Lab Results:  Recent Labs    04/20/20 0600 04/20/20 0838  WBC 12.9* 11.8*  HGB 8.4* 8.4*  HCT 26.5* 27.1*  PLT 367 329   BMET Recent Labs    04/20/20 0635 04/20/20 0838  NA 137 136  K 4.3 4.1  CL 98 98  CO2 25 27  GLUCOSE 132* 147*  BUN 65* 51*  CREATININE 4.19* 3.43*  CALCIUM 9.8 9.4   PT/INR No results for input(s): LABPROT, INR in the last 72 hours. CMP     Component Value Date/Time   NA 136 04/20/2020 0838   K 4.1 04/20/2020 0838   CL 98 04/20/2020 0838   CO2 27 04/20/2020 0838   GLUCOSE 147 (H) 04/20/2020 0838   BUN 51 (H) 04/20/2020 0838   CREATININE 3.43 (H) 04/20/2020 0838   CALCIUM 9.4 04/20/2020 0838    PROT 5.9 (L) 04/13/2020 0500   ALBUMIN 2.2 (L) 04/20/2020 0838   AST 25 04/13/2020 0500   ALT 25 04/13/2020 0500   ALKPHOS 316 (H) 04/13/2020 0500   BILITOT 0.8 04/13/2020 0500   GFRNONAA 16 (L) 04/20/2020 0838   GFRAA 33 (L) 03/05/2020 1703   Lipase  No results found for: LIPASE     Studies/Results: No results found.  Anti-infectives: Anti-infectives (From admission, onward)   Start     Dose/Rate Route Frequency Ordered Stop   04/12/20 1800  meropenem (MERREM) 500 mg in sodium chloride 0.9 % 100 mL IVPB  Status:  Discontinued        500 mg 200 mL/hr over 30 Minutes Intravenous Every 24 hours 04/12/20 1746 04/14/20 1426   04/06/20 1200  vancomycin (VANCOCIN) IVPB 1000 mg/200 mL premix        1,000 mg 200 mL/hr over 60 Minutes Intravenous Every Fri (Hemodialysis) 04/05/20 1529 04/06/20 1300   04/04/20 1200  vancomycin (VANCOCIN) IVPB 1000 mg/200 mL premix        1,000 mg 200 mL/hr over 60 Minutes Intravenous Every M-W-F (Hemodialysis) 04/04/20 1014 04/04/20 1423   04/03/20 0000  piperacillin-tazobactam (ZOSYN) IVPB 2.25 g  Status:  Discontinued        2.25 g 100 mL/hr over 30 Minutes Intravenous Every 8 hours 04/02/20 2253 04/09/20 1559   04/02/20 2345  vancomycin (VANCOCIN) IVPB 1000 mg/200 mL premix        1,000 mg 200 mL/hr over 60 Minutes Intravenous  Once 04/02/20 2251 04/03/20 0236   04/02/20 2251  vancomycin variable dose per unstable renal function (pharmacist dosing)  Status:  Discontinued         Does not apply See admin instructions 04/02/20 2251 04/07/20 1037   04/02/20 1200  ceFAZolin (ANCEF) IVPB 1 g/50 mL premix  Status:  Discontinued        1 g 100 mL/hr over 30 Minutes Intravenous Every 24 hours 04/02/20 1030 04/02/20 2251   04/01/20 1300  vancomycin (VANCOCIN) 2,000 mg in sodium chloride 0.9 % 500 mL IVPB  Status:  Discontinued        2,000 mg 250 mL/hr over 120 Minutes Intravenous  Once 04/01/20 1203 04/01/20 1203   04/01/20 1300  vancomycin  (VANCOREADY) IVPB 2000 mg/400 mL        2,000 mg 200 mL/hr over 120 Minutes Intravenous Every 12 hours 04/01/20 1205 04/01/20 1715   04/01/20 1300  vancomycin (VANCOCIN) 2,000 mg in sodium chloride 0.9 % 500 mL IVPB  Status:  Discontinued        2,000 mg 250 mL/hr over 120 Minutes Intravenous  Once 04/01/20 1207 04/01/20 1208   04/01/20 1245  vancomycin (VANCOCIN) 2,000 mg in sodium chloride 0.9 % 500 mL IVPB  Status:  Discontinued        2,000 mg 250 mL/hr over 120 Minutes Intravenous  Once 04/01/20 1154 04/01/20 1206   04/01/20 1154  vancomycin variable dose per unstable renal function (pharmacist dosing)  Status:  Discontinued         Does not apply See admin instructions 04/01/20 1154 04/02/20 1030   03/11/20 1200  cefTRIAXone (ROCEPHIN) 2 g in sodium chloride 0.9 % 100 mL IVPB        2 g 200 mL/hr over 30 Minutes Intravenous Every 24 hours 03/11/20 1107 03/17/20 1307   03/10/20 1200  piperacillin-tazobactam (ZOSYN) IVPB 3.375 g  Status:  Discontinued        3.375 g 12.5 mL/hr over 240 Minutes Intravenous Every 12 hours 03/10/20 1117 03/10/20 1245   03/10/20 1130  vancomycin (VANCOCIN) IVPB 1000 mg/200 mL premix        1,000 mg 200 mL/hr over 60 Minutes Intravenous  Once 03/10/20 1117 03/10/20 1258   03/10/20 1000  vancomycin variable dose per unstable renal function (pharmacist dosing)  Status:  Discontinued         Does not apply See admin instructions 03/09/20 1412 03/14/20 0810   03/09/20 2300  piperacillin-tazobactam (ZOSYN) IVPB 2.25 g  Status:  Discontinued        2.25 g 100 mL/hr over 30 Minutes Intravenous Every 8 hours 03/09/20 1412 03/10/20 1117   03/09/20 1415  piperacillin-tazobactam (ZOSYN) IVPB 3.375 g        3.375 g 100 mL/hr over 30 Minutes Intravenous  Once 03/09/20 1412 03/09/20 1537   03/09/20 1415  vancomycin (VANCOCIN) 2,250 mg in sodium chloride 0.9 % 500 mL IVPB        2,250 mg 250 mL/hr over 120 Minutes Intravenous  Once 03/09/20 1412 03/09/20 1639    02/28/20 2200  ceFEPIme (MAXIPIME) 1 g in sodium chloride 0.9 % 100 mL IVPB        1 g 200 mL/hr over 30 Minutes Intravenous Every 12 hours 02/28/20 1305 02/29/20 2220   02/28/20 0907  ceFEPIme (MAXIPIME) 1 g in sodium chloride 0.9 %  100 mL IVPB  Status:  Discontinued        1 g 200 mL/hr over 30 Minutes Intravenous Every 24 hours 02/27/20 0939 02/28/20 1305   02/26/20 2200  ceFEPIme (MAXIPIME) 2 g in sodium chloride 0.9 % 100 mL IVPB  Status:  Discontinued        2 g 200 mL/hr over 30 Minutes Intravenous Every 12 hours 02/26/20 1519 02/27/20 0939   02/25/20 1200  ceFEPIme (MAXIPIME) 2 g in sodium chloride 0.9 % 100 mL IVPB  Status:  Discontinued        2 g 200 mL/hr over 30 Minutes Intravenous Every T-Th-Sa (Hemodialysis) 02/25/20 0955 02/26/20 1519   02/24/20 1904  azithromycin (ZITHROMAX) 500 mg in sodium chloride 0.9 % 250 mL IVPB  Status:  Discontinued        500 mg 250 mL/hr over 60 Minutes Intravenous Every 24 hours 02/24/20 1904 02/26/20 0815   02/24/20 1900  azithromycin (ZITHROMAX) 250 mg in dextrose 5 % 125 mL IVPB  Status:  Discontinued        250 mg 125 mL/hr over 60 Minutes Intravenous Every 24 hours 02/24/20 1845 02/24/20 1904   02/24/20 1800  ceFEPIme (MAXIPIME) 2 g in sodium chloride 0.9 % 100 mL IVPB  Status:  Discontinued        2 g 200 mL/hr over 30 Minutes Intravenous Every Fri (Hemodialysis) 02/24/20 1326 02/25/20 0942   02/22/20 1745  ceFAZolin (ANCEF) IVPB 2g/100 mL premix        2 g 200 mL/hr over 30 Minutes Intravenous  Once 02/22/20 1744 02/22/20 1931   02/22/20 0600  ceFAZolin (ANCEF) IVPB 2g/100 mL premix        2 g 200 mL/hr over 30 Minutes Intravenous To Radiology 02/21/20 1658 02/22/20 1900   02/22/20 0000  ceFAZolin (ANCEF) IVPB 1 g/50 mL premix  Status:  Discontinued        1 g 100 mL/hr over 30 Minutes Intravenous To Radiology 02/21/20 1645 02/21/20 1658   02/21/20 1800  azithromycin (ZITHROMAX) tablet 250 mg  Status:  Discontinued       "Followed by"  Linked Group Details   250 mg Oral Daily-1800 02/20/20 1658 02/24/20 1852   02/21/20 1800  ceFEPIme (MAXIPIME) 2 g in sodium chloride 0.9 % 100 mL IVPB  Status:  Discontinued        2 g 200 mL/hr over 30 Minutes Intravenous Every T-Th-Sa (1800) 02/20/20 1737 02/24/20 1326   02/20/20 1800  azithromycin (ZITHROMAX) tablet 500 mg       "Followed by" Linked Group Details   500 mg Oral Daily 02/20/20 1658 02/20/20 1731   02/20/20 1745  ceFEPIme (MAXIPIME) 1 g in sodium chloride 0.9 % 100 mL IVPB        1 g 200 mL/hr over 30 Minutes Intravenous STAT 02/20/20 1737 02/21/20 0747   02/19/20 1145  remdesivir 100 mg in sodium chloride 0.9 % 100 mL IVPB        100 mg 200 mL/hr over 30 Minutes Intravenous Daily 02/19/20 1131 02/19/20 1443   02/15/20 1000  remdesivir 100 mg in sodium chloride 0.9 % 100 mL IVPB  Status:  Discontinued       "Followed by" Linked Group Details   100 mg 200 mL/hr over 30 Minutes Intravenous Daily 02/14/20 0944 02/14/20 0951   02/15/20 1000  remdesivir 100 mg in sodium chloride 0.9 % 100 mL IVPB        100 mg 200 mL/hr  over 30 Minutes Intravenous Daily 02/14/20 0952 02/17/20 1102   02/14/20 1030  remdesivir 100 mg in sodium chloride 0.9 % 100 mL IVPB        100 mg 200 mL/hr over 30 Minutes Intravenous Every 30 min 02/14/20 5597 02/14/20 1129   02/14/20 0945  remdesivir 200 mg in sodium chloride 0.9% 250 mL IVPB  Status:  Discontinued       "Followed by" Linked Group Details   200 mg 580 mL/hr over 30 Minutes Intravenous Once 02/14/20 0944 02/14/20 0951       Assessment/Plan OSA Morbid obesityBMI 44 Hypothyroidism HTN GERD ESRDon HD DM Asthma Chronic respiratory failure 2/2 Covid-19 (diagnosed 02/09/20) S/p tracheostomy Code status DNR  Necrotizing soft tissue infection S/pincision and debridement of sacrum, left gluteus11/2 Dr. Bobbye Morton -POD#17 - Culture: E COLI and ENTEROCOCCUS FAECALIS. Currently on Merrem -wound seems to be cleaning up well  overall with hydrotherapy would continue this for now - after further discussion I think patient can continue with rectal tube while on TF, if she starts having thicker stools when able to take PO again she will just need dressing changed with BMs.   Attempted to update patient's daughter via phone and was unable to reach her  LOS: 66 days    Norm Parcel , [facility] 04/20/2020, 1:32 PM Please see Amion for pager number during day hours 7:00am-4:30pm

## 2020-04-20 NOTE — Progress Notes (Signed)
OT Cancellation Note  Patient Details Name: Wanda Ramirez MRN: [id number] DOB: [birth year]   Cancelled Treatment:    Reason Eval/Treat Not Completed: Patient at procedure or test/ unavailable. -pt. At HD.   Daiva Huge Lorraine-COTA/L 04/20/2020, 10:05 AM

## 2020-04-20 NOTE — Progress Notes (Signed)
Patient transporter here to take patient to dialysis. Patient made aware of plan for trip to dialysis. RN assisted with transferring the patient to portable oxygen tank for trach collar. Vital signs stable prior to patient leaving the unit. No sings of distress from patient.

## 2020-04-20 NOTE — Progress Notes (Signed)
Physical Therapy Wound Treatment Patient Details  Name: Wanda Ramirez MRN: [id number] Date of Birth: 27-Nov-1970  Today's Date: 04/20/2020 Time: 1340-1436 Time Calculation (min): 56 min  Subjective  Subjective: Appears painful but declines additional pain medication. Was premedicated prior to treatment.  Patient and Family Stated Goals: None stated at this time.  Date of Onset:  (Unknown) Prior Treatments: Dressing changes  Pain Score:  Faces pain scale: 6/10  Wound Assessment  Pressure Injury 03/06/20 Sacrum Mid Unstageable - Full thickness tissue loss in which the base of the injury is covered by slough (yellow, tan, gray, green or brown) and/or eschar (tan, brown or black) in the wound bed. (Active)  Dressing Type ABD;Barrier Film (skin prep);Gauze (Comment);Moist to dry 04/20/20 1457  Dressing Changed;Clean;Dry;Intact 04/20/20 1457  Dressing Change Frequency Daily 04/20/20 1457  State of Healing Early/partial granulation 04/20/20 1457  Site / Wound Assessment Red;Yellow;Brown;Black 04/20/20 1457  % Wound base Red or Granulating 65% 04/20/20 1457  % Wound base Yellow/Fibrinous Exudate 15% 04/20/20 1457  % Wound base Black/Eschar 10% 04/20/20 1457  % Wound base Other/Granulation Tissue (Comment) 10% 04/20/20 1457  Peri-wound Assessment Intact 04/20/20 1457  Wound Length (cm) 16.5 cm 04/14/20 1458  Wound Width (cm) 20.3 cm 04/14/20 1458  Wound Depth (cm) 6 cm 04/14/20 1458  Wound Surface Area (cm^2) 334.95 cm^2 04/14/20 1458  Wound Volume (cm^3) 2009.7 cm^3 04/14/20 1458  Tunneling (cm) 11.8 at 8:00, 4.2 at 5:00 04/18/20 1359  Undermining (cm) 6.7 cm from 6:00-8:00 04/14/20 1458  Margins Unattached edges (unapproximated) 04/20/20 1457  Drainage Amount Moderate 04/20/20 1457  Drainage Description Serosanguineous 04/20/20 1457  Treatment Debridement (Selective);Hydrotherapy (Pulse lavage);Packing (Saline gauze) 04/20/20 1457   Santyl applied to wound bed prior to applying  dressing.     Hydrotherapy Pulsed lavage therapy - wound location: sacrum Pulsed Lavage with Suction (psi): 12 psi Pulsed Lavage with Suction - Normal Saline Used: 1000 mL Pulsed Lavage Tip: Tip with splash shield Selective Debridement Selective Debridement - Location: sacrum Selective Debridement - Tools Used: Forceps;Scissors Selective Debridement - Tissue Removed: Yellow necrotic tissue from 5:00 area and necrotic adipose from flap   Wound Assessment and Plan  Wound Therapy - Assess/Plan/Recommendations Wound Therapy - Clinical Statement: PA's present to assess wound with therapist at beginning of session. area of eschar ~2:00 edge that was removed, and noted purulence emerging from underneath. Progressing with debridement at 5:00 wound base and from flap. Will continue to follow for selective removal of unviable tissue, to decrease bioburden, and promote wound bed healing.  Wound Therapy - Functional Problem List: Global weakness in the setting of COVID and extended hospital stay. Factors Delaying/Impairing Wound Healing: Incontinence;Infection - systemic/local;Immobility;Multiple medical problems;Polypharmacy;Diabetes Mellitus Hydrotherapy Plan: Debridement;Dressing change;Patient/family education;Pulsatile lavage with suction Wound Therapy - Frequency: 6X / week Wound Therapy - Follow Up Recommendations: Skilled nursing facility Wound Plan: See above  Wound Therapy Goals- Improve the function of patient's integumentary system by progressing the wound(s) through the phases of wound healing (inflammation - proliferation - remodeling) by: Decrease Necrotic Tissue to: 0 Decrease Necrotic Tissue - Progress: Progressing toward goal Increase Granulation Tissue to: 100% Increase Granulation Tissue - Progress: Progressing toward goal Improve Drainage Characteristics: Min;Serous Improve Drainage Characteristics - Progress: Progressing toward goal Goals/treatment plan/discharge plan were  made with and agreed upon by patient/family: Yes Time For Goal Achievement: 7 days Wound Therapy - Potential for Goals: Excellent  Goals will be updated until maximal potential achieved or discharge criteria met.  Discharge criteria: when goals  achieved, discharge from hospital, MD decision/surgical intervention, no progress towards goals, refusal/missing three consecutive treatments without notification or medical reason.  GP     [name] 04/20/2020, 3:05 PM   Rolinda Roan, PT, DPT Acute Rehabilitation Services Pager: [phone] Office: [phone]

## 2020-04-20 NOTE — Progress Notes (Signed)
PT Cancellation Note  Patient Details Name: Wanda Ramirez MRN: [id number] DOB: [birth year]   Cancelled Treatment:    Reason Eval/Treat Not Completed: Patient at procedure or test/unavailable (going to HD)   [place] 04/20/2020, 7:18 AM  Bayard Males, PT Acute Rehabilitation Services Pager: [phone] Office: [phone]

## 2020-04-20 NOTE — Progress Notes (Addendum)
[facility] Progress Note   OP HD:MWF here (was TTS at OP unit) 4h 37mn 450/800 2/2.25 bath 111.5kg Hep 2000 L AVF - darbe 50 ug q week, last 9/7 - calc 1.0 tiw - 9/9 Hb 10.0, tsat 22%  49yo female w/ ESRD on TTS HD admitted for COVID PNA on 02/13/20. She was intubated. SP CRRT around 9/26- 10/7. Now is on intermittent HD MWF here. She is sp trach. She had MSSA pna. DM2.Then she had large sacral decub requiring I&D.   Assessment/ Plan:   1. COVID pna /sp trach - off vent on trach collar 2. Hypotension - BP's stable on midodrine 10 tid 3. Sacral decub w/ necrosis - sp I&D 11/2 and again 11/5 by gen surg. Surgery following. SP course of IV abx completed 11/12  4. ESRD - HD MWF here for now.sp CRRT 9/26- 10/7. HD today 5. Volume - no edema on exam now, small UF w/ HD 4. HD access: LUA AVF clotted here during acute covid. TDC x2 per IR 5. Anemia ckd/ critical illness - on darbe 150 weekly, 11/12 Ferritin >4000, iron sat 11.  With active infection and ^^ ferritin no IV iron. 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 Nutrition - getting NG Nepro tube feeds, rena-vite 8. S/P MSSA HCAP/ VAP 9. DM2 - per pmd  RKelly Splinter MD 04/20/2020, 11:47 AM    Subjective:   No new problems today. Remains stable, good BP's    Objective:   BP 107/70   Pulse 99   Temp 98.7 F (37.1 C) (Oral)   Resp 14   Ht _0  (1.575 m)   Wt 102.8 kg   SpO2 100%   BMI 41.45 kg/m   Intake/Output Summary (Last 24 hours) at 04/20/2020 1147 Last data filed at 04/20/2020 0452 Gross per 24 hour  Intake 450 ml  Output 1000 ml  Net -550 ml   Weight change:   Physical Exam: Awake, responsive, NG w/ tube feeds, trach w/ trach collar O2 Lungs - clear bilat  CVS- reg w/o RG ABD- BS present soft non-distended  EXT- no sig hip / UE/ LE edema, lt arm access thrombosed ACCESS: LIJ TC  Imaging: No results found.  Labs: BMET Recent Labs  Lab  04/14/20 0719 04/15/20 0449 04/16/20 0400 04/17/20 0744 04/18/20 0818 04/20/20 0635 04/20/20 0838  NA 135 132* 130* 135 135 137 136  K 4.0 4.1 5.8* 3.0* 3.9 4.3 4.1  CL 98 96* 95* 96* 97* 98 98  CO2 23 21* 17* _1 GLUCOSE 158* 148* 226* 120* 173* 132* 147*  BUN 45* 80* 101* 40* 67* 65* 51*  CREATININE 3.42* 4.49* 5.60* 3.02* 4.39* 4.19* 3.43*  CALCIUM 9.4 9.1 9.0 9.3 9.4 9.8 9.4  PHOS 7.3* 8.9* 11.1* 5.9* 7.0* 5.1* 4.2   CBC Recent Labs  Lab 04/18/20 0818 04/19/20 1503 04/20/20 0600 04/20/20 0838  WBC 12.4* 10.9* 12.9* 11.8*  HGB 7.8* 9.0* 8.4* 8.4*  HCT 24.2* 27.7* 26.5* 27.1*  MCV 91.3 92.3 93.6 94.4  PLT 283 338 367 329    Medications:    . sodium chloride   Intravenous Once  . B-complex with vitamin C  1 tablet Per Tube Daily  . chlorhexidine gluconate (MEDLINE KIT)  15 mL Mouth Rinse BID  . Chlorhexidine Gluconate Cloth  6 each Topical Daily  . clonazepam  0.5 mg Per Tube TID  . darbepoetin (ARANESP) injection -  DIALYSIS  150 mcg Intravenous Q Wed-HD  . feeding supplement (PROSource TF)  45 mL Per Tube TID  . free water  20 mL Per Tube Q6H  . [START ON 04/21/2020] heparin  2,000 Units Dialysis Once in dialysis  . hydrocerin   Topical Daily  . insulin aspart  0-20 Units Subcutaneous Q4H  . insulin aspart  7 Units Subcutaneous Q4H  . insulin detemir  30 Units Subcutaneous BID  . levothyroxine  125 mcg Per Tube Q0600  . midodrine  10 mg Per Tube TID WC  . multivitamin  1 tablet Oral QHS  . nutrition supplement (JUVEN)  1 packet Per Tube BID  . oxyCODONE  10 mg Per Tube Q6H  . pantoprazole sodium  40 mg Per Tube BID  . QUEtiapine  50 mg Per Tube BID  . sodium chloride flush  10-40 mL Intracatheter Q12H  . Thrombi-Pad  1 each Topical Once

## 2020-04-21 LAB — RENAL FUNCTION PANEL
Albumin: 2 g/dL — ABNORMAL LOW (ref 3.5–5.0)
Anion gap: 11 (ref 5–15)
BUN: 39 mg/dL — ABNORMAL HIGH (ref 6–20)
CO2: 28 mmol/L (ref 22–32)
Calcium: 9.4 mg/dL (ref 8.9–10.3)
Chloride: 99 mmol/L (ref 98–111)
Creatinine, Ser: 2.79 mg/dL — ABNORMAL HIGH (ref 0.44–1.00)
GFR, Estimated: 20 mL/min — ABNORMAL LOW (ref >60)
Glucose, Bld: 102 mg/dL — ABNORMAL HIGH (ref 70–99)
Phosphorus: 3.7 mg/dL (ref 2.5–4.6)
Potassium: 3.7 mmol/L (ref 3.5–5.1)
Sodium: 138 mmol/L (ref 135–145)

## 2020-04-21 LAB — GLUCOSE, CAPILLARY
Glucose-Capillary: 102 mg/dL — ABNORMAL HIGH (ref 70–99)
Glucose-Capillary: 117 mg/dL — ABNORMAL HIGH (ref 70–99)
Glucose-Capillary: 128 mg/dL — ABNORMAL HIGH (ref 70–99)
Glucose-Capillary: 138 mg/dL — ABNORMAL HIGH (ref 70–99)
Glucose-Capillary: 141 mg/dL — ABNORMAL HIGH (ref 70–99)
Glucose-Capillary: 159 mg/dL — ABNORMAL HIGH (ref 70–99)
Glucose-Capillary: 67 mg/dL — ABNORMAL LOW (ref 70–99)
Glucose-Capillary: 88 mg/dL (ref 70–99)

## 2020-04-21 LAB — CBC
HCT: 27.6 % — ABNORMAL LOW (ref 36.0–46.0)
Hemoglobin: 8.9 g/dL — ABNORMAL LOW (ref 12.0–15.0)
MCH: 29.8 pg (ref 26.0–34.0)
MCHC: 32.2 g/dL (ref 30.0–36.0)
MCV: 92.3 fL (ref 80.0–100.0)
Platelets: 338 10*3/uL (ref 150–400)
RBC: 2.99 MIL/uL — ABNORMAL LOW (ref 3.87–5.11)
RDW: 13.8 % (ref 11.5–15.5)
WBC: 11.6 10*3/uL — ABNORMAL HIGH (ref 4.0–10.5)
nRBC: 0.2 % (ref 0.0–0.2)

## 2020-04-21 MED ORDER — CLONAZEPAM 0.25 MG PO TBDP
0.2500 mg | ORAL_TABLET | Freq: Three times a day (TID) | ORAL | Status: DC
  Administered 2020-04-21 – 2020-04-24 (×9): 0.25 mg
  Filled 2020-04-21 (×9): qty 1

## 2020-04-21 MED ORDER — INSULIN DETEMIR 100 UNIT/ML ~~LOC~~ SOLN
25.0000 [IU] | Freq: Two times a day (BID) | SUBCUTANEOUS | Status: DC
  Administered 2020-04-21 – 2020-04-24 (×7): 25 [IU] via SUBCUTANEOUS
  Filled 2020-04-21 (×10): qty 0.25

## 2020-04-21 MED ORDER — DEXTROSE 50 % IV SOLN
INTRAVENOUS | Status: AC
  Administered 2020-04-21: 50 mL
  Filled 2020-04-21: qty 50

## 2020-04-21 NOTE — Progress Notes (Signed)
Attempted to contact pt's daughter to update--no answer.

## 2020-04-21 NOTE — Progress Notes (Signed)
CBG 67. Referred to hypoglycemia sidebar. CBG recheck 141.

## 2020-04-21 NOTE — Progress Notes (Signed)
Physical Therapy Wound Treatment Patient Details  Name: Wanda Ramirez MRN: [id number] Date of Birth: February 28, 1971  Today's Date: 04/21/2020 Time: 7793-9030 Time Calculation (min): 39 min  Subjective  Subjective: Appears painful at times but declines additional pain medication. Was premedicated prior to treatment.  Patient and Family Stated Goals: None stated at this time.  Date of Onset:  (Unknown) Prior Treatments: Dressing changes  Pain Score:  Faces pain scale 4/10  Wound Assessment  Pressure Injury 03/06/20 Sacrum Mid Unstageable - Full thickness tissue loss in which the base of the injury is covered by slough (yellow, tan, gray, green or brown) and/or eschar (tan, brown or black) in the wound bed. (Active)  Wound Image   04/21/20 1413  Dressing Type ABD;Barrier Film (skin prep);Gauze (Comment);Moist to dry 04/21/20 1413  Dressing Changed;Clean;Dry;Intact 04/21/20 1413  Dressing Change Frequency Daily 04/21/20 1413  State of Healing Early/partial granulation 04/21/20 1413  Site / Wound Assessment Red;Yellow;Brown 04/21/20 1413  % Wound base Red or Granulating 65% 04/21/20 1413  % Wound base Yellow/Fibrinous Exudate 15% 04/21/20 1413  % Wound base Black/Eschar 10% 04/21/20 1413  % Wound base Other/Granulation Tissue (Comment) 10% 04/21/20 1413  Peri-wound Assessment Intact 04/21/20 1413  Wound Length (cm) 15 cm 04/20/20 1457  Wound Width (cm) 19 cm 04/20/20 1457  Wound Depth (cm) 8.2 cm 04/20/20 1457  Wound Surface Area (cm^2) 285 cm^2 04/20/20 1457  Wound Volume (cm^3) 2337 cm^3 04/20/20 1457  Tunneling (cm) 12.5 cm at 8:00, 5.3 cm tunnel at 5:00 04/20/20 1457  Undermining (cm) 6.7 cm from 6:00-8:00 04/14/20 1458  Margins Unattached edges (unapproximated) 04/21/20 1413  Drainage Amount Moderate 04/21/20 1413  Drainage Description Serosanguineous 04/21/20 1413  Treatment Debridement (Selective);Hydrotherapy (Pulse lavage) 04/21/20 1413   Santyl applied to wound bed prior to  applying dressing.     Hydrotherapy Pulsed lavage therapy - wound location: sacrum Pulsed Lavage with Suction (psi): 12 psi Pulsed Lavage with Suction - Normal Saline Used: 1000 mL Pulsed Lavage Tip: Tip with splash shield Selective Debridement Selective Debridement - Location: sacrum Selective Debridement - Tools Used: Forceps;Scissors Selective Debridement - Tissue Removed: Yellow necrotic tissue from 5:00 area and necrotic adipose from flap   Wound Assessment and Plan  Wound Therapy - Assess/Plan/Recommendations Wound Therapy - Clinical Statement: Overall, wound bed appearance improving. Progressing with debridement at 5:00 wound base and from flap. Will continue to follow for selective removal of unviable tissue, to decrease bioburden, and promote wound bed healing.  Wound Therapy - Functional Problem List: Global weakness in the setting of COVID and extended hospital stay. Factors Delaying/Impairing Wound Healing: Incontinence;Infection - systemic/local;Immobility;Multiple medical problems;Polypharmacy;Diabetes Mellitus Hydrotherapy Plan: Debridement;Dressing change;Patient/family education;Pulsatile lavage with suction Wound Therapy - Frequency: 6X / week Wound Therapy - Follow Up Recommendations: Skilled nursing facility Wound Plan: See above  Wound Therapy Goals- Improve the function of patient's integumentary system by progressing the wound(s) through the phases of wound healing (inflammation - proliferation - remodeling) by: Decrease Necrotic Tissue to: 0 Decrease Necrotic Tissue - Progress: Progressing toward goal Increase Granulation Tissue to: 100% Increase Granulation Tissue - Progress: Progressing toward goal Improve Drainage Characteristics: Min;Serous Improve Drainage Characteristics - Progress: Progressing toward goal Goals/treatment plan/discharge plan were made with and agreed upon by patient/family: Yes Time For Goal Achievement: 7 days Wound Therapy - Potential  for Goals: Excellent  Goals will be updated until maximal potential achieved or discharge criteria met.  Discharge criteria: when goals achieved, discharge from hospital, MD decision/surgical intervention, no progress towards  goals, refusal/missing three consecutive treatments without notification or medical reason.  GP     [name] 04/21/2020, 2:15 PM   Rolinda Roan, PT, DPT Acute Rehabilitation Services Pager: [phone] Office: [phone]

## 2020-04-21 NOTE — Progress Notes (Signed)
NAME:  Wanda Ramirez, MRN:  [id number], DOB:  [birth year], LOS: 30 ADMISSION DATE:  02/13/2020   Interm history/ Subjective   No significant overnight events. General surgery notes reviewed. Planning to continue hydrotherapy for now. Hopeful to defer diverting colostomy at this time.  Pt noting right knee pain this morning. She implies it started overnight.   Significant Hospital Events   9/14 Admission to general floor 9/23 Tunneled HD cath placed 9/26 Transferred to ICU for hypotension and CRRT 10/1 On CRRT and vasopressors 10/6 Extubated 10/18 tracheostomy performed 10/31 Restart vanc for staph in resp culture 11/2 Surgical debridement for worsening sacral wound 11/10 IMTS to assume care 11/11 febrile. merrem started. Surgery reconsulted 11/13 merrem stopped 11/14 hemodynamic significant sacral wound bleeding. Surgery placed stitch. 11/15 tx to ICU for hemorrhagic shock. Started on pressors. 11/17 transferred back to IMTS  Objective   Blood pressure 134/83, pulse (!) 112, temperature 98.7 F (37.1 C), temperature source Oral, resp. rate 18, height 5\' 2"  (1.575 m), weight 102.8 kg, SpO2 100 %.     Intake/Output Summary (Last 24 hours) at 04/21/2020 0501 Last data filed at 04/21/2020 0300 Gross per 24 hour  Intake 150 ml  Output 849 ml  Net -699 ml   Filed Weights   04/13/20 0725 04/13/20 1117 04/16/20 1630  Weight: 106 kg 102.8 kg 102.8 kg    Examination: General: chronically ill appearing but in NAD HEENT: tracheostomy present. Able to speak 3-4 words now. Pulm: remains on 5L via trach mask. Lungs clear anteriorly. MSK: increased warmth of R>L knee. Prepatellar bounce present bilaterally. Pain with ROM of right knee. Skin: no asymmetric skin changes present of knees  Consults:  Nephrology General surgery Palliative care Pulm  Significant Diagnostic Tests:  9/13 CXR>> bilateral patchy airspace opacities 9/21 VQ scan>> no PE 9/29 echocardiogram>> normal  LVEF, findings consistent with increased RV volume and fluid overload, moderately reduced RV function 10/28 CT abdomen/pelvis >>scattered ground glass and consolidative opacities in the lung bases. Cystic space in the anterior, subpleural RML with septations favoring complicated pneumatocele. Non-obstructive punctuate nephrolithiasis 11/1 CT abdomen/pelvis>> persistent multifocal bibasilar airspace disease with scarring. Bullous changes seen in RML. Decubitus ulcer overlying the sacrococcygeal junction with extensive gas seen in the left gluteal region involving the muscle and fat. No evidence of abscess. Findings consistent with cellulitis and myositis. No evidence of body destruction to suggest osteomyelitis.  Micro Data:  9/13, 9/26, 10/8, 10/24, 11/2, 11/9 blood cultures>>no growth 10/29 resp culture>> MSSA, resistant to clinda, erythromycin, clinda 11/2 wound culture>> e.coli resistant to ampicillin, cephalosporins, bactrim, e. Faecalis pan sensitive  Antimicrobials:  Remdesivir 9/14>>9/19 Cefepime 9/20>>9/29 Azithromycin 9/20>>9/25 Zosyn 10/8, 11/1>11/8 Vanc 10/8, 10/31>>11/5 Rocephin>>10/10>>10/16 Merrem 11/11>>11/13  Procedures/lines  Tracheostomy shiley 6.0 XLT 10/18 Sacral wound debridement--11/2  Tunneled HD line 9/27>> Central line 11/2>> Rectal tube 10/2>> Cortrak 10/4>>  Central line 9/26-10/24 A-line 9/30>>10/13, 11/2-11/9 HD line 9/22-9/27 ETT 9/26-10/6 ETT 10/8-10/18  Labs    CBC Latest Ref Rng & Units 04/21/2020 04/20/2020 04/20/2020  WBC 4.0 - 10.5 K/uL 11.6(H) 11.8(H) 12.9(H)  Hemoglobin 12.0 - 15.0 g/dL 8.9(L) 8.4(L) 8.4(L)  Hematocrit 36 - 46 % 27.6(L) 27.1(L) 26.5(L)  Platelets 150 - 400 K/uL 338 329 367   BMP Latest Ref Rng & Units 04/21/2020 04/20/2020 04/20/2020  Glucose 70 - 99 mg/dL 102(H) 147(H) 132(H)  BUN 6 - 20 mg/dL 39(H) 51(H) 65(H)  Creatinine 0.44 - 1.00 mg/dL 2.79(H) 3.43(H) 4.19(H)  Sodium 135 - 145 mmol/L 138 136 137  Potassium 3.5  - 5.1 mmol/L 3.7 4.1 4.3  Chloride 98 - 111 mmol/L 99 98 98  CO2 22 - 32 mmol/L 28 27 25   Calcium 8.9 - 10.3 mg/dL 9.4 9.4 9.8    Summary  55 yof admitted 02/17/20 for COVID-19 pneumonia who developed chronic respiratory failure requiring a tracheostomy after an extended stay in ICU. Her hospitalization has been complicated by MSSA pneumonia as well as the development of a necrotizing soft tissue infection involving her gluteus secondary to MDR organisms. She was transferred to the IMTS 11/10.   Goals of care has been an ongoing conversation this hospitalization. Her daughter, [name], is her next of kin and has been the Scientist, research (medical). I am hopeful that [facility] may start to be able to contribute to these conversations in conjunction with palliative care now that she is beginning to be able to speak.  From a medical standpoint, she would be stable to discharge to a facility where hydrotherapy, wound care, trach care, HD, and ongoing rehabilitation could be provided should she be agreeable to this.  Assessment & Plan:  Principal Problem:   COVID-19 Active Problems:   End stage renal disease on dialysis ([place])   Diabetes mellitus type 2 in obese (HCC)   OSA (obstructive sleep apnea)   HCAP (healthcare-associated pneumonia)   Acute respiratory failure with hypoxemia (HCC)   Encephalopathy acute   ARDS (adult respiratory distress syndrome) (HCC)   Pressure injury of skin   Aspiration into airway   Status post tracheostomy ([place])   Fever   Septic shock (HCC)   Cellulitis of buttock   Palliative care encounter   Sacral wound  Right knee pain. Positional vs inflammatory vs infectious.  R knee is slightly warmer than left however, other than that, no signs to point to infection. When in the room, she implied that she felt it would be more comfortable in a different position. Other thoughts would include pseudogout, gout, trauma/injury, etc however would like to start conservatively and  go from there. If pain doesn't improve or worsens, can consider getting an arthrocentesis to r/o infection/inflammatory issues.  ICU delirium --continue seroquel 50mg  BID (decreased 11/18). Will decrease klonapin to 0.25mg  TID. --delirium precautions.   ESRD on iHD MWF. AVF clotted off resulting in need for use of tunneled HD line. On midodrine 10 mg TID --Management per nephrology.   Anemia of CKD/critical illness . Hgb stable. On aranesp, midodrine.   Chronic hypoxic respiratory failure s/p tracheostomy secondary to COVID 19 pneumonia complicated by MSSA pneumonia--unable to be weaned from vent. Stable on trach collar on 5L  6.0 shiley XLT cuffless.  Plan: continue trach care. Management per trach team.  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 White count stable. Afebrile. No sign of active infection at this time. Multi-drug resistant bacteria as noted on prior wound culture.   Plan -- Hydrotherapy per surgery recommendations.  --continue frequent repositioning. wound care/dressing changes --Pain management: oxycodone 10mg  q6h, prn fentanyl 133mcg with dressing changes --PT/OT to work on mobility  Type 2 Diabetes. Decrease levemir to 25U BID, novolog 7U every 4h and resistant SSI.   High risk malnutrition. Tube feeds via cortrak.   Hypothyroidism. Continue synthroid.  Goals of care. Full scope of care.  Best practice:  CODE STATUS: DNR Diet: tube feeds DVT for prophylaxis: heparin Social considerations/Family communication: will update daughter Dispo: She would be stable from our point to transition to an [facility] that  could perform HD, hydrotherapy and continue tube feeds.   Mitzi Hansen, MD Internal Medicine Resident PGY-2 [facility] Pager: [phone] 04/21/2020 5:01 AM    Contact info:  Please page me at [phone] from 7am-5pm M-F.  Please use the IMTS after hours pager at  [phone] after 5pm M-F and on weekends

## 2020-04-21 NOTE — Progress Notes (Addendum)
[facility] Progress Note   OP HD: TTS  4h 15min 450/800 2/2.25 bath 111.5kg Hep 2000 L AVF - darbe 50 ug q week, last 9/7 - calc 1.0 tiw - 9/9 Hb 10.0, tsat 22%  49 yo female w/ ESRD on TTS HD admitted for COVID PNA on 02/13/20. She was intubated. SP CRRT around 9/26- 10/7. Now is on intermittent HD MWF here. She is sp trach. She had MSSA pna. DM2.Then she had large sacral decub requiring I&D.   Assessment/ Plan:   1. COVID pna /sp trach - off vent on trach collar 2. Hypotension - BP's stable on midodrine 10 tid 3. Sacral decub w/ necrosis - sp I&D 11/2 and again 11/5 by gen surg. Surgery following. SP course of IV abx completed 11/12  4. ESRD - switching back to TTS.sp CRRT 9/26- 10/7. Next HD Monday per holiday schedule. Will see again Monday, call for any questions over the w/e.  5. Volume - no edema on exam now, small UF w/ next HD 4. HD access: LUA AVF clotted here during acute covid. TDC x2 per IR 5. Anemia ckd/ critical illness - on darbe 150 weekly, 11/12 Ferritin >4000, iron sat 11.  With active infection and ^^ ferritin no IV iron. 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 Nutrition - getting NG Nepro tube feeds, rena-vite 8. S/P MSSA HCAP/ VAP 9. DM2 - per pmd  Rob , MD 04/21/2020, 1:58 PM    Subjective:   No new problems today. Remains stable, good BP's    Objective:   BP 123/77 (BP Location: Right Arm)   Pulse (!) 106   Temp 98.5 F (36.9 C) (Oral)   Resp 16   Ht 5' 2" (1.575 m)   Wt 106.5 kg   SpO2 100%   BMI 42.94 kg/m   Intake/Output Summary (Last 24 hours) at 04/21/2020 1358 Last data filed at 04/21/2020 0300 Gross per 24 hour  Intake 150 ml  Output --  Net 150 ml   Weight change:   Physical Exam: Awake, responsive, NG w/ tube feeds, trach w/ trach collar O2 Lungs - clear bilat  CVS- reg w/o RG ABD- BS present soft non-distended  EXT- no sig hip / UE/ LE edema, lt arm access  thrombosed ACCESS: LIJ TC  Imaging: No results found.  Labs: BMET Recent Labs  Lab 04/15/20 0449 04/16/20 0400 04/17/20 0744 04/18/20 0818 04/20/20 0635 04/20/20 0838 04/21/20 0334  NA 132* 130* 135 135 137 136 138  K 4.1 5.8* 3.0* 3.9 4.3 4.1 3.7  CL 96* 95* 96* 97* 98 98 99  CO2 21* 17* 26 24 25 27 28  GLUCOSE 148* 226* 120* 173* 132* 147* 102*  BUN 80* 101* 40* 67* 65* 51* 39*  CREATININE 4.49* 5.60* 3.02* 4.39* 4.19* 3.43* 2.79*  CALCIUM 9.1 9.0 9.3 9.4 9.8 9.4 9.4  PHOS 8.9* 11.1* 5.9* 7.0* 5.1* 4.2 3.7   CBC Recent Labs  Lab 04/19/20 1503 04/20/20 0600 04/20/20 0838 04/21/20 0334  WBC 10.9* 12.9* 11.8* 11.6*  HGB 9.0* 8.4* 8.4* 8.9*  HCT 27.7* 26.5* 27.1* 27.6*  MCV 92.3 93.6 94.4 92.3  PLT 338 367 329 338    Medications:    . sodium chloride   Intravenous Once  . B-complex with vitamin C  1 tablet Per Tube Daily  . chlorhexidine gluconate (MEDLINE KIT)  15 mL Mouth Rinse BID  . Chlorhexidine Gluconate Cloth  6 each   Topical Daily  . clonazepam  0.25 mg Per Tube TID  . darbepoetin (ARANESP) injection - DIALYSIS  150 mcg Intravenous Q Wed-HD  . feeding supplement (PROSource TF)  45 mL Per Tube TID  . free water  20 mL Per Tube Q6H  . hydrocerin   Topical Daily  . insulin aspart  0-20 Units Subcutaneous Q4H  . insulin aspart  7 Units Subcutaneous Q4H  . insulin detemir  25 Units Subcutaneous BID  . levothyroxine  125 mcg Per Tube Q0600  . midodrine  10 mg Per Tube TID WC  . multivitamin  1 tablet Oral QHS  . nutrition supplement (JUVEN)  1 packet Per Tube BID  . oxyCODONE  10 mg Per Tube Q6H  . pantoprazole sodium  40 mg Per Tube BID  . QUEtiapine  50 mg Per Tube BID  . sodium chloride flush  10-40 mL Intracatheter Q12H  . Thrombi-Pad  1 each Topical Once      

## 2020-04-21 NOTE — Plan of Care (Signed)
°  Problem: Education: Goal: Knowledge of risk factors and measures for prevention of condition will improve Outcome: Progressing   Problem: Coping: Goal: Psychosocial and spiritual needs will be supported Outcome: Progressing   Problem: Respiratory: Goal: Will maintain a patent airway Outcome: Progressing Goal: Complications related to the disease process, condition or treatment will be avoided or minimized Outcome: Progressing   Problem: Education: Goal: Knowledge of General Education information will improve Description: Including pain rating scale, medication(s)/side effects and non-pharmacologic comfort measures Outcome: Progressing   Problem: Health Behavior/Discharge Planning: Goal: Ability to manage health-related needs will improve Outcome: Progressing   Problem: Clinical Measurements: Goal: Ability to maintain clinical measurements within normal limits will improve Outcome: Progressing Goal: Will remain free from infection Outcome: Progressing Goal: Diagnostic test results will improve Outcome: Progressing Goal: Respiratory complications will improve Outcome: Progressing Goal: Cardiovascular complication will be avoided Outcome: Progressing   Problem: Activity: Goal: Risk for activity intolerance will decrease Outcome: Progressing   Problem: Nutrition: Goal: Adequate nutrition will be maintained Outcome: Progressing   Problem: Coping: Goal: Level of anxiety will decrease Outcome: Progressing   Problem: Elimination: Goal: Will not experience complications related to bowel motility Outcome: Progressing   Problem: Pain Managment: Goal: General experience of comfort will improve Outcome: Progressing   Problem: Safety: Goal: Ability to remain free from injury will improve Outcome: Progressing   Problem: Skin Integrity: Goal: Risk for impaired skin integrity will decrease Outcome: Progressing   Problem: Activity: Goal: Ability to tolerate increased  activity will improve Outcome: Progressing   Problem: Respiratory: Goal: Ability to maintain a clear airway and adequate ventilation will improve Outcome: Progressing   Problem: Role Relationship: Goal: Method of communication will improve Outcome: Progressing   Problem: Education: Goal: Knowledge of disease and its progression will improve Outcome: Progressing Goal: Individualized Educational Video(s) Outcome: Progressing   Problem: Fluid Volume: Goal: Compliance with measures to maintain balanced fluid volume will improve Outcome: Progressing   Problem: Health Behavior/Discharge Planning: Goal: Ability to manage health-related needs will improve Outcome: Progressing   Problem: Nutritional: Goal: Ability to make healthy dietary choices will improve Outcome: Progressing   Problem: Clinical Measurements: Goal: Complications related to the disease process, condition or treatment will be avoided or minimized Outcome: Progressing   Problem: Activity: Goal: Ability to tolerate increased activity will improve Outcome: Progressing   Problem: Respiratory: Goal: Ability to maintain a clear airway and adequate ventilation will improve Outcome: Progressing   Problem: Role Relationship: Goal: Method of communication will improve Outcome: Progressing

## 2020-04-22 LAB — RENAL FUNCTION PANEL
Albumin: 2.1 g/dL — ABNORMAL LOW (ref 3.5–5.0)
Anion gap: 14 (ref 5–15)
BUN: 72 mg/dL — ABNORMAL HIGH (ref 6–20)
CO2: 23 mmol/L (ref 22–32)
Calcium: 9.9 mg/dL (ref 8.9–10.3)
Chloride: 99 mmol/L (ref 98–111)
Creatinine, Ser: 4.07 mg/dL — ABNORMAL HIGH (ref 0.44–1.00)
GFR, Estimated: 13 mL/min — ABNORMAL LOW (ref >60)
Glucose, Bld: 149 mg/dL — ABNORMAL HIGH (ref 70–99)
Phosphorus: 5.5 mg/dL — ABNORMAL HIGH (ref 2.5–4.6)
Potassium: 4.3 mmol/L (ref 3.5–5.1)
Sodium: 136 mmol/L (ref 135–145)

## 2020-04-22 LAB — CBC
HCT: 27.9 % — ABNORMAL LOW (ref 36.0–46.0)
Hemoglobin: 8.6 g/dL — ABNORMAL LOW (ref 12.0–15.0)
MCH: 29 pg (ref 26.0–34.0)
MCHC: 30.8 g/dL (ref 30.0–36.0)
MCV: 93.9 fL (ref 80.0–100.0)
Platelets: 322 10*3/uL (ref 150–400)
RBC: 2.97 MIL/uL — ABNORMAL LOW (ref 3.87–5.11)
RDW: 13.7 % (ref 11.5–15.5)
WBC: 13.2 10*3/uL — ABNORMAL HIGH (ref 4.0–10.5)
nRBC: 0 % (ref 0.0–0.2)

## 2020-04-22 LAB — GLUCOSE, CAPILLARY
Glucose-Capillary: 112 mg/dL — ABNORMAL HIGH (ref 70–99)
Glucose-Capillary: 119 mg/dL — ABNORMAL HIGH (ref 70–99)
Glucose-Capillary: 126 mg/dL — ABNORMAL HIGH (ref 70–99)
Glucose-Capillary: 128 mg/dL — ABNORMAL HIGH (ref 70–99)
Glucose-Capillary: 167 mg/dL — ABNORMAL HIGH (ref 70–99)
Glucose-Capillary: 88 mg/dL (ref 70–99)

## 2020-04-22 MED ORDER — CAMPHOR-MENTHOL 0.5-0.5 % EX LOTN
TOPICAL_LOTION | CUTANEOUS | Status: DC | PRN
  Filled 2020-04-22: qty 222

## 2020-04-22 NOTE — Plan of Care (Signed)
  Problem: Education: Goal: Knowledge of risk factors and measures for prevention of condition will improve Outcome: Progressing   Problem: Coping: Goal: Psychosocial and spiritual needs will be supported Outcome: Progressing   Problem: Respiratory: Goal: Will maintain a patent airway Outcome: Progressing Goal: Complications related to the disease process, condition or treatment will be avoided or minimized Outcome: Progressing   Problem: Education: Goal: Knowledge of General Education information will improve Description: Including pain rating scale, medication(s)/side effects and non-pharmacologic comfort measures Outcome: Progressing   Problem: Health Behavior/Discharge Planning: Goal: Ability to manage health-related needs will improve Outcome: Progressing   Problem: Clinical Measurements: Goal: Ability to maintain clinical measurements within normal limits will improve Outcome: Progressing Goal: Will remain free from infection Outcome: Progressing Goal: Diagnostic test results will improve Outcome: Progressing Goal: Respiratory complications will improve Outcome: Progressing Goal: Cardiovascular complication will be avoided Outcome: Progressing   Problem: Activity: Goal: Risk for activity intolerance will decrease Outcome: Progressing   Problem: Nutrition: Goal: Adequate nutrition will be maintained Outcome: Progressing   Problem: Coping: Goal: Level of anxiety will decrease Outcome: Progressing   Problem: Elimination: Goal: Will not experience complications related to bowel motility Outcome: Progressing   Problem: Pain Managment: Goal: General experience of comfort will improve Outcome: Progressing   Problem: Safety: Goal: Ability to remain free from injury will improve Outcome: Progressing   Problem: Skin Integrity: Goal: Risk for impaired skin integrity will decrease Outcome: Progressing   Problem: Activity: Goal: Ability to tolerate increased  activity will improve Outcome: Progressing   Problem: Respiratory: Goal: Ability to maintain a clear airway and adequate ventilation will improve Outcome: Progressing   Problem: Role Relationship: Goal: Method of communication will improve Outcome: Progressing   Problem: Education: Goal: Knowledge of disease and its progression will improve Outcome: Progressing Goal: Individualized Educational Video(s) Outcome: Progressing   Problem: Fluid Volume: Goal: Compliance with measures to maintain balanced fluid volume will improve Outcome: Progressing   Problem: Health Behavior/Discharge Planning: Goal: Ability to manage health-related needs will improve Outcome: Progressing   Problem: Nutritional: Goal: Ability to make healthy dietary choices will improve Outcome: Progressing   Problem: Clinical Measurements: Goal: Complications related to the disease process, condition or treatment will be avoided or minimized Outcome: Progressing   Problem: Activity: Goal: Ability to tolerate increased activity will improve Outcome: Progressing   Problem: Respiratory: Goal: Ability to maintain a clear airway and adequate ventilation will improve Outcome: Progressing   Problem: Role Relationship: Goal: Method of communication will improve Outcome: Progressing

## 2020-04-22 NOTE — Progress Notes (Signed)
NAME:  Wanda Ramirez, MRN:  [id number], DOB:  [birth year], LOS: 104 ADMISSION DATE:  02/13/2020   Interm history/ Subjective   No significant overnight events Bedside RN notes that she has been a little more anxious/tearful over the past day. I discussed that I have been working on weaning down the seroquel and klonapin which may be attributable and encouraged her to reach out if it seems to get worse. She was pretty awake this morning. I discussed with her what long term treatment and the rehabilitation process would look like. She asked questions throughout our conversation. When I brought up the topic of what Wanda Ramirez's thoughts were on going to an LTACH or similar facility, she became tearful understandably, and seemed to express some ambivalence about doing this. She was trying to say something but unfortunately her voice was too quiet to illicit what it was she was saying.  I asked her if she would be able to write down what she was trying to say and she shook her head no. I discussed that I am hopeful she would be able to start using a Passy Mui Valve soon which would help facilitate her side of the conversation.  I asked her if she would like [place] to continue treating her medical problems as we have. To this question, she became tearful again but did not shake or nod her answer. She asked to speak with her daughter after our visit.  She notes resolution of right knee pain.  I updated Wanda Ramirez this morning after our visit and updated her on the current plan from the surgery notes  [facility] Events   9/14 Admission to general floor 9/23 Tunneled HD cath placed 9/26 Transferred to ICU for hypotension and CRRT 10/1 On CRRT and vasopressors 10/6 Extubated 10/18 tracheostomy performed 10/31 Restart vanc for staph in resp culture 11/2 Surgical debridement for worsening sacral wound 11/10 IMTS to assume care 11/11 febrile. merrem started. Surgery reconsulted 11/13 merrem  stopped 11/14 hemodynamic significant sacral wound bleeding. Surgery placed stitch. 11/15 tx to ICU for hemorrhagic shock. Started on pressors. 11/17 transferred back to IMTS  Objective   Blood pressure 111/62, pulse (!) 101, temperature 98.6 F (37 C), temperature source Oral, resp. rate 13, height 5\' 2"  (1.575 m), weight 105.9 kg, SpO2 100 %.    No intake or output data in the 24 hours ending 04/22/20 0552 Filed Weights   04/21/20 0500 04/22/20 0457  Weight: 106.5 kg 105.9 kg    Examination: General: chronically ill appearing but in NAD HEENT: tracheostomy present. Voice soft but she is able to speak full sentences Pulm: remains on 5L via trach mask. Breathing comfortably MSK: moves all extremities. Is able to roll to her back and side but using bed rail.  Consults:  Nephrology General surgery Palliative care Pulm  Significant Diagnostic Tests:  9/13 CXR>> bilateral patchy airspace opacities 9/21 VQ scan>> no PE 9/29 echocardiogram>> normal LVEF, findings consistent with increased RV volume and fluid overload, moderately reduced RV function 10/28 CT abdomen/pelvis >>scattered ground glass and consolidative opacities in the lung bases. Cystic space in the anterior, subpleural RML with septations favoring complicated pneumatocele. Non-obstructive punctuate nephrolithiasis 11/1 CT abdomen/pelvis>> persistent multifocal bibasilar airspace disease with scarring. Bullous changes seen in RML. Decubitus ulcer overlying the sacrococcygeal junction with extensive gas seen in the left gluteal region involving the muscle and fat. No evidence of abscess. Findings consistent with cellulitis and myositis. No evidence of body destruction to suggest osteomyelitis.  Micro  Data:  9/13, 9/26, 10/8, 10/24, 11/2, 11/9 blood cultures>>no growth 10/29 resp culture>> MSSA, resistant to clinda, erythromycin, clinda 11/2 wound culture>> e.coli resistant to ampicillin, cephalosporins, bactrim, e. Faecalis  pan sensitive  Antimicrobials:  Remdesivir 9/14>>9/19 Cefepime 9/20>>9/29 Azithromycin 9/20>>9/25 Zosyn 10/8, 11/1>11/8 Vanc 10/8, 10/31>>11/5 Rocephin>>10/10>>10/16 Merrem 11/11>>11/13  Procedures/lines  Tracheostomy shiley 6.0 XLT 10/18 Sacral wound debridement--11/2  Tunneled HD line 9/27>> Central line 11/2>> Rectal tube 10/2>> Cortrak 10/4>>  Central line 9/26-10/24 A-line 9/30>>10/13, 11/2-11/9 HD line 9/22-9/27 ETT 9/26-10/6 ETT 10/8-10/18  Labs    CBC Latest Ref Rng & Units 04/22/2020 04/21/2020 04/20/2020  WBC 4.0 - 10.5 K/uL 13.2(H) 11.6(H) 11.8(H)  Hemoglobin 12.0 - 15.0 g/dL 8.6(L) 8.9(L) 8.4(L)  Hematocrit 36 - 46 % 27.9(L) 27.6(L) 27.1(L)  Platelets 150 - 400 K/uL 322 338 329   BMP Latest Ref Rng & Units 04/22/2020 04/21/2020 04/20/2020  Glucose 70 - 99 mg/dL 149(H) 102(H) 147(H)  BUN 6 - 20 mg/dL 72(H) 39(H) 51(H)  Creatinine 0.44 - 1.00 mg/dL 4.07(H) 2.79(H) 3.43(H)  Sodium 135 - 145 mmol/L 136 138 136  Potassium 3.5 - 5.1 mmol/L 4.3 3.7 4.1  Chloride 98 - 111 mmol/L 99 99 98  CO2 22 - 32 mmol/L 23 28 27   Calcium 8.9 - 10.3 mg/dL 9.9 9.4 9.4    Summary  37 yof admitted 02/17/20 for COVID-19 pneumonia who developed chronic respiratory failure requiring a tracheostomy after an extended stay in ICU. Her hospitalization has been complicated by MSSA pneumonia as well as the development of a necrotizing soft tissue infection involving her gluteus secondary to MDR organisms. She was transferred to the IMTS 11/10.   Goals of care has been an ongoing conversation this hospitalization. Her daughter, Wanda Ramirez, is her next of kin and has been the Scientist, research (medical). I am hopeful that [facility] may start to be able to contribute to these conversations in conjunction with palliative care now that she is beginning to be able to speak.  From a medical standpoint, she would be stable to discharge to a facility where hydrotherapy, wound care, trach care, HD, and  ongoing rehabilitation could be provided should she be agreeable to this.  Assessment & Plan:  Principal Problem:   COVID-19 Active Problems:   End stage renal disease on dialysis (Wanda Ramirez)   Diabetes mellitus type 2 in obese (HCC)   OSA (obstructive sleep apnea)   HCAP (healthcare-associated pneumonia)   Acute respiratory failure with hypoxemia (HCC)   Encephalopathy acute   ARDS (adult respiratory distress syndrome) (HCC)   Pressure injury of skin   Aspiration into airway   Status post tracheostomy (Wanda Ramirez)   Fever   Septic shock (HCC)   Cellulitis of buttock   Palliative care encounter   Sacral wound  Right knee pain. resolved  ICU delirium. Bedside RN is noting pt to be more anxious and tearful today. I suspect she has a component of depression which is understandable. I would like to titrate her off of the seroquel and klonapin eventually but she may benefit from an SSRI to help treat the anxiety and depressive symptoms. --continue seroquel 50mg  BID (decreased 11/18), klonapin to 0.25mg  TID (decreased 11/20) --delirium precautions.   ESRD on iHD MWF. AVF clotted off resulting in need for use of tunneled HD line. On midodrine 10 mg TID --Management per nephrology.   Anemia of CKD/critical illness . Hgb stable. On aranesp, midodrine.   Chronic hypoxic respiratory failure s/p tracheostomy secondary to COVID 19 pneumonia complicated by MSSA  pneumonia--unable to be weaned from vent. Stable on trach collar on 5L  6.0 shiley XLT cuffless.  Plan: continue trach care. Management per trach team. Hopeful to get her using PMV soon.  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 Multi-drug resistant bacteria as noted on prior wound culture.   Plan -- Hydrotherapy per surgery recommendations.  --continue frequent repositioning. wound care/dressing changes --Pain management: oxycodone 10mg  q6h, prn fentanyl 16mcg with dressing  changes --PT/OT to work on mobility  Type 2 Diabetes. CBGs at goal. Continue levemir 25U BID, novolog 7U every 4h and resistant SSI.   High risk malnutrition. Tube feeds via cortrak.   Hypothyroidism. Continue synthroid.  Goals of care. Full scope of care.  Best practice:  CODE STATUS: DNR Diet: tube feeds DVT for prophylaxis: heparin Social considerations/Family communication: Wanda Ramirez updated Dispo: She would be stable from our point to transition to an LTACH that could perform HD, hydrotherapy and continue tube feeds.   Mitzi Hansen, MD Internal Medicine Resident PGY-2 [facility] Pager: [phone] 04/22/2020 5:52 AM    Contact info:  Please page me at [phone] from 7am-5pm M-F.  Please use the IMTS after hours pager at [phone] after 5pm M-F and on weekends

## 2020-04-22 NOTE — Progress Notes (Signed)
Pt has left IJ triple lumen CVC that was placed on 04/03/20. Site wnl. On 11/13 proximal port was occluded and taped off, and other 2 ports were declotted with success. On 11/21 noted both the medial and distal ports flush, but do not have a blood return. Pt does not have any continuous meds ordered at this time. Suggest removal of central line.

## 2020-04-22 NOTE — Progress Notes (Signed)
Notes reviewed.   Went to room to see patient.  She is sleeping comfortably and soundly.  Will not wake.   RN gave me daily report. She has had difficulty this morning with itching.  RN bathed and applied lotion.    Patient denies pain but has a very difficult time getting into a comfortable position per RN.  Nursing goes in very frequently for repositioning but it is a struggle to find a comfortable position for the patient.    Will write for Sarna lotion to help with itch.  PMT will continue to check in intermittently. We are hopeful patient will be coherent and strong enough to direct her own medical care.  Please call our team if we are needed more urgently.  Florentina Jenny, PA-C Palliative Medicine Office:  [phone]  No charge.

## 2020-04-23 LAB — RENAL FUNCTION PANEL
Albumin: 1.9 g/dL — ABNORMAL LOW (ref 3.5–5.0)
Anion gap: 17 — ABNORMAL HIGH (ref 5–15)
BUN: 108 mg/dL — ABNORMAL HIGH (ref 6–20)
CO2: 26 mmol/L (ref 22–32)
Calcium: 10 mg/dL (ref 8.9–10.3)
Chloride: 92 mmol/L — ABNORMAL LOW (ref 98–111)
Creatinine, Ser: 5.4 mg/dL — ABNORMAL HIGH (ref 0.44–1.00)
GFR, Estimated: 9 mL/min — ABNORMAL LOW (ref >60)
Glucose, Bld: 130 mg/dL — ABNORMAL HIGH (ref 70–99)
Phosphorus: 6.4 mg/dL — ABNORMAL HIGH (ref 2.5–4.6)
Potassium: 4.3 mmol/L (ref 3.5–5.1)
Sodium: 135 mmol/L (ref 135–145)

## 2020-04-23 LAB — CBC
HCT: 26.4 % — ABNORMAL LOW (ref 36.0–46.0)
Hemoglobin: 8.4 g/dL — ABNORMAL LOW (ref 12.0–15.0)
MCH: 29.8 pg (ref 26.0–34.0)
MCHC: 31.8 g/dL (ref 30.0–36.0)
MCV: 93.6 fL (ref 80.0–100.0)
Platelets: 463 10*3/uL — ABNORMAL HIGH (ref 150–400)
RBC: 2.82 MIL/uL — ABNORMAL LOW (ref 3.87–5.11)
RDW: 13.8 % (ref 11.5–15.5)
WBC: 12.7 10*3/uL — ABNORMAL HIGH (ref 4.0–10.5)
nRBC: 0 % (ref 0.0–0.2)

## 2020-04-23 LAB — GLUCOSE, CAPILLARY
Glucose-Capillary: 122 mg/dL — ABNORMAL HIGH (ref 70–99)
Glucose-Capillary: 185 mg/dL — ABNORMAL HIGH (ref 70–99)
Glucose-Capillary: 124 mg/dL — ABNORMAL HIGH (ref 70–99)
Glucose-Capillary: 132 mg/dL — ABNORMAL HIGH (ref 70–99)
Glucose-Capillary: 124 mg/dL — ABNORMAL HIGH (ref 70–99)

## 2020-04-23 MED ORDER — COLLAGENASE 250 UNIT/GM EX OINT
TOPICAL_OINTMENT | Freq: Every day | CUTANEOUS | Status: DC
  Filled 2020-04-23 (×2): qty 30

## 2020-04-23 MED ORDER — HEPARIN SODIUM (PORCINE) 1000 UNIT/ML DIALYSIS: 2000.0000 [IU] | Freq: Once | INTRAMUSCULAR | Status: DC

## 2020-04-23 MED ORDER — HEPARIN SODIUM (PORCINE) 1000 UNIT/ML IJ SOLN
INTRAMUSCULAR | Status: AC
  Filled 2020-04-23: qty 6

## 2020-04-23 MED ORDER — HEPARIN SODIUM (PORCINE) 1000 UNIT/ML DIALYSIS
2000.0000 [IU] | Freq: Once | INTRAMUSCULAR | Status: AC
  Administered 2020-04-23: 2000 [IU] via INTRAVENOUS_CENTRAL

## 2020-04-23 NOTE — Progress Notes (Signed)
Assessed for PIV.  Previous IV team member unable to obtain PIV in order to remove central line.  Assess arm with ultrasound.  Lower arm appears bruised from wrist to elbow.  No suitable veins found.  Veins in upper arm too deep for PIV insertion  Unable to obtain PIV.  Floor RN notified.

## 2020-04-23 NOTE — Progress Notes (Addendum)
NAME:  Wanda Ramirez, MRN:  [id number], DOB:  01/27/1971, LOS: 61 ADMISSION DATE:  02/13/2020   Interm history/ Subjective   O/N events: Increased oxygen requirement from 5 to 6L  Ms.Bjorklund was examined and evaluated at bedside during dialysis. She was noted to have difficulty speaking with very faint voice. She is able to nod yes/no to questions and denies any acute pain at the moment.  Spoke with Ms.Delpilar's daughter, [name], at bedside. She mentions having significant concern regarding access to her medical records and as she wants transparency regarding her hospital course. She mentions feeling that our providers have not been providing sufficient communication with her family members. Also mentions that she has concerns regarding Mrs.Lince's sacral ulcers as she feels this is due to inadequate care at the hospital. Discussed with Ms.Toney Rakes regarding need for Mrs.Porchia to give consent prior to her medical records being released to other family members. Also explained that the clinical team have been regularly updating her daughter, [name], and we cannot accommodate daily updates to individual family members. Also described Mrs.Everson's clinical course and treatments performed for her sacral wound and lack of recovery as sign of severity of her illness rather than inadequate care. Ms.Maria expressed understanding. All other questions and concerns addressed.  Significant Hospital Events   9/14 Admission to general floor 9/23 Tunneled HD cath placed 9/26 Transferred to ICU for hypotension and CRRT 10/1 On CRRT and vasopressors 10/6 Extubated 10/18 tracheostomy performed 10/31 Restart vanc for staph in resp culture 11/2 Surgical debridement for worsening sacral wound 11/10 IMTS to assume care 11/11 febrile. merrem started. Surgery reconsulted 11/13 merrem stopped 11/14 hemodynamic significant sacral wound bleeding. Surgery placed stitch. 11/15 tx to ICU for hemorrhagic shock. Started on  pressors. 11/17 transferred back to IMTS  Objective   Blood pressure 138/83, pulse (!) 112, temperature 98.4 F (36.9 C), temperature source Oral, resp. rate 20, height 5\' 2"  (1.575 m), weight 104.2 kg, SpO2 96 %.     Intake/Output Summary (Last 24 hours) at 04/23/2020 1300 Last data filed at 04/23/2020 1054 Gross per 24 hour  Intake 837 ml  Output 2000 ml  Net -1163 ml   Filed Weights   04/23/20 0500 04/23/20 0718 04/23/20 1054  Weight: 106.1 kg 106.2 kg 104.2 kg    Examination: Gen: Chronically ill-appearing, NAD HEENT: NCAT head, hearing intact, trach collar intact Pulm: No rales, no wheezes, no respiratory distress CV: Tachycardic, regular rhythm, no murmurs Extm: ROM intact, No peripheral edema Skin: Dry, Warm, normal turgor, sacral ulcer covered with fresh bandaging Neuro: Alert, Unable to assess orientation, follow direction appropriately   Consults:  Nephrology General surgery Palliative care Pulm  Significant Diagnostic Tests:  9/13 CXR>> bilateral patchy airspace opacities 9/21 VQ scan>> no PE 9/29 echocardiogram>> normal LVEF, findings consistent with increased RV volume and fluid overload, moderately reduced RV function 10/28 CT abdomen/pelvis >>scattered ground glass and consolidative opacities in the lung bases. Cystic space in the anterior, subpleural RML with septations favoring complicated pneumatocele. Non-obstructive punctuate nephrolithiasis 11/1 CT abdomen/pelvis>> persistent multifocal bibasilar airspace disease with scarring. Bullous changes seen in RML. Decubitus ulcer overlying the sacrococcygeal junction with extensive gas seen in the left gluteal region involving the muscle and fat. No evidence of abscess. Findings consistent with cellulitis and myositis. No evidence of body destruction to suggest osteomyelitis.  Micro Data:  9/13, 9/26, 10/8, 10/24, 11/2, 11/9 blood cultures>>no growth 10/29 resp culture>> MSSA, resistant to clinda,  erythromycin, clinda 11/2 wound culture>> e.coli resistant to  ampicillin, cephalosporins, bactrim, e. Faecalis pan sensitive  Antimicrobials:  Remdesivir 9/14>>9/19 Cefepime 9/20>>9/29 Azithromycin 9/20>>9/25 Zosyn 10/8, 11/1>11/8 Vanc 10/8, 10/31>>11/5 Rocephin>>10/10>>10/16 Merrem 11/11>>11/13  Procedures/lines  Tracheostomy shiley 6.0 XLT 10/18 Sacral wound debridement--11/2  Tunneled HD line 9/27>> Central line 11/2>> Rectal tube 10/2>> Cortrak 10/4>>  Central line 9/26-10/24 A-line 9/30>>10/13, 11/2-11/9 HD line 9/22-9/27 ETT 9/26-10/6 ETT 10/8-10/18  Labs    CBC Latest Ref Rng & Units 04/23/2020 04/22/2020 04/21/2020  WBC 4.0 - 10.5 K/uL 12.7(H) 13.2(H) 11.6(H)  Hemoglobin 12.0 - 15.0 g/dL 8.4(L) 8.6(L) 8.9(L)  Hematocrit 36 - 46 % 26.4(L) 27.9(L) 27.6(L)  Platelets 150 - 400 K/uL 463(H) 322 338   BMP Latest Ref Rng & Units 04/23/2020 04/22/2020 04/21/2020  Glucose 70 - 99 mg/dL 130(H) 149(H) 102(H)  BUN 6 - 20 mg/dL 108(H) 72(H) 39(H)  Creatinine 0.44 - 1.00 mg/dL 5.40(H) 4.07(H) 2.79(H)  Sodium 135 - 145 mmol/L 135 136 138  Potassium 3.5 - 5.1 mmol/L 4.3 4.3 3.7  Chloride 98 - 111 mmol/L 92(L) 99 99  CO2 22 - 32 mmol/L 26 23 28   Calcium 8.9 - 10.3 mg/dL 10.0 9.9 9.4    Summary  70 yof admitted 02/17/20 for COVID-19 pneumonia who developed chronic respiratory failure requiring a tracheostomy after an extended stay in ICU. Her hospitalization has been complicated by MSSA pneumonia as well as the development of a necrotizing soft tissue infection involving her gluteus secondary to MDR organisms. She was transferred to the IMTS 11/10.   Continues to have difficulty with goals of care conversation due to chronic respiratory failure and difficulty tolerating Passy-Muir valve. Current ongoing plan is to discharge to [facility]. No bed offers at this time.  Assessment & Plan:  Principal Problem:   COVID-19 Active Problems:   End stage renal disease on dialysis ([place])    Diabetes mellitus type 2 in obese (HCC)   OSA (obstructive sleep apnea)   HCAP (healthcare-associated pneumonia)   Acute respiratory failure with hypoxemia (HCC)   Encephalopathy acute   ARDS (adult respiratory distress syndrome) (HCC)   Pressure injury of skin   Aspiration into airway   Status post tracheostomy ([place])   Fever   Septic shock (HCC)   Cellulitis of buttock   Palliative care encounter   Sacral wound  ICU delirium.  Currently not agitated, able to follow directions. Appear to be in better mood with daughter at bedside. --continue seroquel 50mg  BID (decreased 11/18), klonapin to 0.25mg  TID (decreased 11/20) --delirium precautions.  ESRD on iHD MWF. AVF clotted off resulting in need for use of tunneled HD line. On midodrine 10 mg TID --Management per nephrology.   Anemia of CKD/critical illness . Hgb stable 8.6->8.4. On aranesp, midodrine.   Chronic hypoxic respiratory failure s/p tracheostomy secondary to COVID 19 pneumonia complicated by MSSA pneumonia--unable to be weaned from vent. Slight increase in oxygen requirement to 6L but due to HD this am 6.0 shiley XLT cuffless.  Plan: continue trach care. Management per trach team. 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 Multi-drug resistant bacteria as noted on prior wound culture.   Plan -- Hydrotherapy per surgery recommendations.  -- continue frequent repositioning. wound care/dressing changes -- Pain management: oxycodone 10mg  q6h, prn fentanyl 176mcg with dressing changes -- PT/OT to work on mobility  Type 2 Diabetes. CBGs at goal. Continue levemir 25U BID, novolog 7U every 4h and resistant SSI.  High risk malnutrition. Tube feeds via cortrak.  Hypothyroidism. Continue synthroid.  Goals of care. Full scope of care.  Best practice:  CODE STATUS: DNR Diet: tube feeds DVT for prophylaxis: heparin Social considerations/Family  communication: [name] updated at bedside Dispo: She would be stable from our point to transition to an LTACH that could perform HD, hydrotherapy and continue tube feeds.  Mosetta Anis, MD 04/23/2020, 1:23 PM Pager: [phone] After 5pm on weekdays and 1pm on weekends: On Call Pager: [phone]    Contact info:  Please page me at [phone] from 7am-5pm M-F.  Please use the IMTS after hours pager at [phone] after 5pm M-F and on weekends

## 2020-04-23 NOTE — Progress Notes (Signed)
[facility] Progress Note   Assessment/ Plan:      OP HD: TTS  4h 3mn 450/800 2/2.25 bath 111.5kg Hep 2000 L AVF - darbe 50 ug q week, last 9/7 - calc 1.0 tiw - 9/9 Hb 10.0, tsat 22%  49yo female w/ ESRD on TTS HD admitted for COVID PNA on 02/13/20. She was intubated. SP CRRT around 9/26- 10/7. Now is on intermittent HD MWF here. She is sp trach. She had MSSA pna. DM2.Then she had large sacral decub requiring I&D.   1. COVID pna /sp trach - off vent on trach collar 2. Hypotension - BP's stable on midodrine 10 tid 3. Sacral decub w/ necrosis - sp I&D 11/2 and again 11/5 by gen surg. Surgery following. SP course of IV abx completed 11/12  4. ESRD - switching back to TTS.sp CRRT 9/26- 10/7. Next HD today Monday per holiday schedule.  5. Volume - no edema on exam now, small UF w/ next HD 4. HD access: LUA AVF clotted here during acute covid. TDC x2 per IR 5. Anemia ckd/ critical illness - on darbe 150 weekly, 11/12 Ferritin >4000, iron sat 11.  With active infection and ^^ ferritin no IV iron. 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 Nutrition - getting NG Nepro tube feeds, rena-vite 8. S/P MSSA HCAP/ VAP 9. DM2 - per pmd   Subjective:    Seen on dialysis.  Lethargic, opens eyes to questioning but falls right back asleep   Objective:   BP 114/67 (BP Location: Left Leg)   Pulse (!) 114   Temp 97.9 F (36.6 C) (Oral)   Resp 12   Ht 5' 2" (1.575 m)   Wt 106.2 kg   SpO2 100%   BMI 42.82 kg/m   Physical Exam: Gen: lying in bed, on dialysis HEENT: trach and coretrak in place CVS: RRR Resp:coarse bilateral Abd: soft Ext: no LE edema SKIN: large sacral decub not examined today ACCESS: AVF  Labs: BMET Recent Labs  Lab 04/17/20 0744 04/18/20 0818 04/20/20 0635 04/20/20 0838 04/21/20 0334 04/22/20 0144 04/23/20 0308  NA 135 135 137 136 138 136 135  K 3.0* 3.9 4.3 4.1 3.7 4.3 4.3  CL 96* 97* 98 98 99 99 92*   CO2 _0 GLUCOSE 120* 173* 132* 147* 102* 149* 130*  BUN 40* 67* 65* 51* 39* 72* 108*  CREATININE 3.02* 4.39* 4.19* 3.43* 2.79* 4.07* 5.40*  CALCIUM 9.3 9.4 9.8 9.4 9.4 9.9 10.0  PHOS 5.9* 7.0* 5.1* 4.2 3.7 5.5* 6.4*   CBC Recent Labs  Lab 04/20/20 0838 04/21/20 0334 04/22/20 0144 04/23/20 0308  WBC 11.8* 11.6* 13.2* 12.7*  HGB 8.4* 8.9* 8.6* 8.4*  HCT 27.1* 27.6* 27.9* 26.4*  MCV 94.4 92.3 93.9 93.6  PLT 329 338 322 463*      Medications:    . sodium chloride   Intravenous Once  . B-complex with vitamin C  1 tablet Per Tube Daily  . chlorhexidine gluconate (MEDLINE KIT)  15 mL Mouth Rinse BID  . Chlorhexidine Gluconate Cloth  6 each Topical Daily  . clonazepam  0.25 mg Per Tube TID  . darbepoetin (ARANESP) injection - DIALYSIS  150 mcg Intravenous Q Wed-HD  . feeding supplement (PROSource TF)  45 mL Per Tube TID  . free water  20 mL Per Tube Q6H  . [START ON 04/24/2020] heparin  2,000 Units Dialysis Once  in dialysis  . heparin sodium (porcine)      . hydrocerin   Topical Daily  . insulin aspart  0-20 Units Subcutaneous Q4H  . insulin aspart  7 Units Subcutaneous Q4H  . insulin detemir  25 Units Subcutaneous BID  . levothyroxine  125 mcg Per Tube Q0600  . midodrine  10 mg Per Tube TID WC  . multivitamin  1 tablet Oral QHS  . nutrition supplement (JUVEN)  1 packet Per Tube BID  . oxyCODONE  10 mg Per Tube Q6H  . pantoprazole sodium  40 mg Per Tube BID  . QUEtiapine  50 mg Per Tube BID  . sodium chloride flush  10-40 mL Intracatheter Q12H  . Thrombi-Pad  1 each Topical Once      , MD 04/23/2020, 9:30 AM  

## 2020-04-23 NOTE — Progress Notes (Signed)
Physical Therapy Wound Treatment Patient Details  Name: Wanda Ramirez MRN: [id number] Date of Birth: February 15, 1971  Today's Date: 04/23/2020 Time: 5093-2671 Time Calculation (min): 47 min  Subjective  Subjective: Appears painful at times but declines additional pain medication. Was premedicated prior to treatment.  Patient and Family Stated Goals: None stated at this time.  Date of Onset:  (Unknown) Prior Treatments: Dressing changes  Pain Score: Premedicated. Pt uncomfortable - minor pain at times but mostly from itching.   Wound Assessment  Pressure Injury 03/06/20 Sacrum Mid Unstageable - Full thickness tissue loss in which the base of the injury is covered by slough (yellow, tan, gray, green or brown) and/or eschar (tan, brown or black) in the wound bed. (Active)  Dressing Type ABD;Barrier Film (skin prep);Gauze (Comment);Moist to dry 04/23/20 1453  Dressing Changed;Clean;Dry;Intact 04/23/20 1453  Dressing Change Frequency Daily 04/23/20 1453  State of Healing Early/partial granulation 04/23/20 1453  Site / Wound Assessment Red;Yellow;Brown 04/23/20 1453  % Wound base Red or Granulating 75% 04/23/20 1453  % Wound base Yellow/Fibrinous Exudate 10% 04/23/20 1453  % Wound base Black/Eschar 5% 04/23/20 1453  % Wound base Other/Granulation Tissue (Comment) 10% 04/23/20 1453  Peri-wound Assessment Intact 04/21/20 1413  Wound Length (cm) 15 cm 04/20/20 1457  Wound Width (cm) 19 cm 04/20/20 1457  Wound Depth (cm) 8.2 cm 04/20/20 1457  Wound Surface Area (cm^2) 285 cm^2 04/20/20 1457  Wound Volume (cm^3) 2337 cm^3 04/20/20 1457  Tunneling (cm) 12.5 cm at 8:00, 5.3 cm tunnel at 5:00 04/20/20 1457  Undermining (cm) 6.7 cm from 6:00-8:00 04/14/20 1458  Margins Unattached edges (unapproximated) 04/23/20 1453  Drainage Amount Moderate 04/23/20 1453  Drainage Description Serosanguineous 04/23/20 1453  Treatment Debridement (Selective);Hydrotherapy (Pulse lavage);Packing (Saline gauze)  04/23/20 1453   Santyl applied to wound bed prior to applying dressing.     Hydrotherapy Pulsed lavage therapy - wound location: sacrum Pulsed Lavage with Suction (psi): 12 psi Pulsed Lavage with Suction - Normal Saline Used: 1000 mL Pulsed Lavage Tip: Tip with splash shield Selective Debridement Selective Debridement - Location: sacrum Selective Debridement - Tools Used: Forceps;Scissors Selective Debridement - Tissue Removed: Yellow and brown necrotic tissue from 5:00 area and necrotic adipose from flap; black eschar over tunnel at 8:00   Wound Assessment and Plan  Wound Therapy - Assess/Plan/Recommendations Wound Therapy - Clinical Statement: Daughter present and observed part of hydrotherapy treatment this session. Answered her questions within scope of practice. Progressing with removal of unviable tissue, with area opening up laterally at base of the 5:00 area. This patient will benefit from continued hydrotherapy for selective removal of unviable tissue, to decrease bioburden and promote wound bed healing.  Wound Therapy - Functional Problem List: Global weakness in the setting of COVID and extended hospital stay. Factors Delaying/Impairing Wound Healing: Incontinence;Infection - systemic/local;Immobility;Multiple medical problems;Polypharmacy;Diabetes Mellitus Hydrotherapy Plan: Debridement;Dressing change;Patient/family education;Pulsatile lavage with suction Wound Therapy - Frequency: 6X / week Wound Therapy - Follow Up Recommendations: Skilled nursing facility Wound Plan: See above  Wound Therapy Goals- Improve the function of patient's integumentary system by progressing the wound(s) through the phases of wound healing (inflammation - proliferation - remodeling) by: Decrease Necrotic Tissue to: 0 Decrease Necrotic Tissue - Progress: Progressing toward goal Increase Granulation Tissue to: 100% Increase Granulation Tissue - Progress: Progressing toward goal Improve Drainage  Characteristics: Min;Serous Improve Drainage Characteristics - Progress: Progressing toward goal Goals/treatment plan/discharge plan were made with and agreed upon by patient/family: Yes Time For Goal Achievement: 7 days Wound Therapy -  Potential for Goals: Excellent  Goals will be updated until maximal potential achieved or discharge criteria met.  Discharge criteria: when goals achieved, discharge from hospital, MD decision/surgical intervention, no progress towards goals, refusal/missing three consecutive treatments without notification or medical reason.  GP     [name] 04/23/2020, 3:04 PM   Rolinda Roan, PT, DPT Acute Rehabilitation Services Pager: [phone] Office: [phone]

## 2020-04-23 NOTE — Evaluation (Signed)
Clinical/Bedside Swallow Evaluation Patient Details  Name: Wanda Ramirez MRN: [id number] Date of Birth: 08-23-1970  Today's Date: 04/23/2020 Time: SLP Start Time (ACUTE ONLY): 2202 SLP Stop Time (ACUTE ONLY): 1334 SLP Time Calculation (min) (ACUTE ONLY): 13 min  Past Medical History:  Past Medical History:  Diagnosis Date  . Arthritis   . Asthma   . Chronic back pain   . Diabetes mellitus ([place])   . ESRD (end stage renal disease) on dialysis (HCC)    TTS  . Essential hypertension   . GERD (gastroesophageal reflux disease)   . Glaucoma   . Hyperlipidemia   . Hypothyroidism   . Morbid obesity ([place])   . Peripheral neuropathy   . Sleep apnea    wears CPAP, does not know setting   Past Surgical History:  Past Surgical History:  Procedure Laterality Date  . AV FISTULA PLACEMENT Left 04/2014  . CARDIAC CATHETERIZATION N/A 03/17/2016   Procedure: Left Heart Cath and Coronary Angiography;  Surgeon: [name], MD;  Location: [facility];  Service: Cardiovascular;  Laterality: N/A;  . INCISION AND DRAINAGE PERIRECTAL ABSCESS N/A 04/03/2020   Procedure: EXCISIONAL DEBRIDEMENT OF SACRAL AND GLUTEAL WOUNDS;  Surgeon: [name], MD;  Location: [facility];  Service: General;  Laterality: N/A;  . IR FLUORO GUIDE CV LINE LEFT  02/22/2020  . IR FLUORO GUIDE CV LINE LEFT  02/27/2020  . IR US GUIDE VASC ACCESS LEFT  02/22/2020  . KNEE SURGERY    . TUBAL LIGATION     HPI:  49 year old female with PMH of with end-stage renal disease on hemodialysis and chronic lung disease on chronic supplemental oxygen at 3 L/min. Admitted 9/13 with acute on chronic hypoxic respiratory failure due to COVID-19 pneumonia. Intubated 9/24-10/6, reintubated 10/8, CRRT 9/26-10/7, trach and bronch 10/18, trach collar 11/8. PMV trials began 10/27 with minimal toleration, SLP discharged 11/3 due to medical decline. Reconsulted 11/10. Pt currently has cuffed Shiley 6   Assessment / Plan /  Recommendation Clinical Impression  Bedside swallow assessment completed with speaking valve in place and daughter at bedside. She has had a complicated hospital course and SLP has been working with pt with PMV. Today she is appropriate for asessment across various textures including puree, thin and nectar thick. Pt pools saliva in her right buccal cavity as well as mild amount with applesauce. There was no coughing, or throat clearing with cup sips thin or puree and respiratory rate remained stable. Instrumental evaluation with FEES (fiberoptic endoscopic evaluation of swallow) recommended to fully assess swallow function. Plan for tomorrow if pt appropriate.   SLP Visit Diagnosis: Aphonia (R49.1)    Aspiration Risk  Mild aspiration risk;Moderate aspiration risk    Diet Recommendation NPO   Medication Administration: Via alternative means    Other  Recommendations Oral Care Recommendations: Oral care QID   Follow up Recommendations LTACH;Skilled Nursing facility      Frequency and Duration            Prognosis Prognosis for Safe Diet Advancement: Good Barriers to Reach Goals: Cognitive deficits;Severity of deficits      Swallow Study   General HPI: 49 year old female with PMH of with end-stage renal disease on hemodialysis and chronic lung disease on chronic supplemental oxygen at 3 L/min. Admitted 9/13 with acute on chronic hypoxic respiratory failure due to COVID-19 pneumonia. Intubated 9/24-10/6, reintubated 10/8, CRRT 9/26-10/7, trach and bronch 10/18, trach collar 11/8. PMV trials began 10/27 with minimal toleration,  SLP discharged 11/3 due to medical decline. Reconsulted 11/10. Pt currently has cuffed Shiley 6 Type of Study: Bedside Swallow Evaluation Previous Swallow Assessment: none Diet Prior to this Study: NPO;NG Tube Temperature Spikes Noted: No Respiratory Status: Trach;Trach Collar Trach Size and Type: Cuff;#6;Deflated;Other (Comment);With PMSV in place (XLT) History  of Recent Intubation:  (greater than 3 weeks) Behavior/Cognition: Alert;Cooperative;Pleasant mood Oral Cavity Assessment: Within Functional Limits Oral Care Completed by SLP: Yes Oral Cavity - Dentition: Adequate natural dentition Vision: Functional for self-feeding Self-Feeding Abilities: Able to feed self;Needs assist Patient Positioning: Upright in bed Baseline Vocal Quality: Breathy;Low vocal intensity Volitional Cough: Weak    Oral/Motor/Sensory Function Overall Oral Motor/Sensory Function: Mild impairment Facial Strength: Other (Comment) (drooling from right )   Ice Chips Ice chips: Not tested   Thin Liquid Thin Liquid: Impaired Presentation: Cup;Straw Oral Phase Impairments: Reduced labial seal Oral Phase Functional Implications: Right anterior spillage Pharyngeal  Phase Impairments: Suspected delayed Swallow    Nectar Thick Nectar Thick Liquid: Within functional limits Presentation: Spoon   Honey Thick Honey Thick Liquid: Not tested   Puree Puree: Within functional limits   Solid     Solid: Not tested      [name] 04/23/2020,2:45 PM  Orbie Pyo Colvin Caroli.Ed Risk analyst [phone] Office [phone]

## 2020-04-23 NOTE — Procedures (Signed)
Patient seen and examined on Hemodialysis. BP 114/67 (BP Location: Left Leg)   Pulse (!) 114   Temp 97.9 F (36.6 C) (Oral)   Resp 12   Ht 5\' 2"  (1.575 m)   Wt 106.2 kg   SpO2 100%   BMI 42.82 kg/m   QB 400 mL/ min, UF goal 2.5L   Tolerating treatment without complaints at this time.   Madelon Lips MD 9:34 AM

## 2020-04-23 NOTE — Progress Notes (Signed)
  Speech Language Pathology Treatment: Wanda Ramirez Speaking valve  Patient Details Name: Wanda Ramirez MRN: [id number] DOB: [birth year] Today's Date: 04/23/2020 Time: 7622-6333 SLP Time Calculation (min) (ACUTE ONLY): 13 min  Assessment / Plan / Recommendation Clinical Impression  Pt seen after HD and prior to hydrotherapy with daughter, Wanda Ramirez present. She was able to mobilize exhalations to upper airway to initiate a low intensity, breathy vocal quality in 60% of attempts. Her coordination of phonation and respiration is reduced and needs verbal cues although her sustained attention waxes and wanes with therapist. SpO2 was 93% and HR 112. No back pressure/air trapping at any time when valve doffed. Will continue treatment to maximize vocalization of needs and aiding to restore a more natural respiratory exchange via nose/mouth.   HPI HPI: 49 year old female with PMH of with end-stage renal disease on hemodialysis and chronic lung disease on chronic supplemental oxygen at 3 L/min. Admitted 9/13 with acute on chronic hypoxic respiratory failure due to COVID-19 pneumonia. Intubated 9/24-10/6, reintubated 10/8, CRRT 9/26-10/7, trach and bronch 10/18, trach collar 11/8. PMV trials began 10/27 with minimal toleration, SLP discharged 11/3 due to medical decline. Reconsulted 11/10. Pt currently has cuffed Shiley 6      SLP Plan  Continue with current plan of care       Recommendations         Patient may use Passy-Muir Speech Valve: During all therapies with supervision PMSV Supervision: Full         Oral Care Recommendations: Oral care QID Follow up Recommendations: LTACH;Skilled Nursing facility SLP Visit Diagnosis: Aphonia (R49.1) Plan: Continue with current plan of care       GO                [name] 04/23/2020, 2:05 PM

## 2020-04-23 NOTE — Progress Notes (Signed)
PT Cancellation Note  Patient Details Name: Wanda Ramirez MRN: [id number] DOB: 06-May-1971   Cancelled Treatment:    Reason Eval/Treat Not Completed: Patient at procedure or test/unavailable (HD)   Estephani Popper B Ashima Shrake 04/23/2020, 7:28 AM  Bayard Males, PT Acute Rehabilitation Services Pager: [phone] Office: [phone]

## 2020-04-23 NOTE — Plan of Care (Signed)
  Problem: Education: Goal: Knowledge of risk factors and measures for prevention of condition will improve Outcome: Progressing   Problem: Coping: Goal: Psychosocial and spiritual needs will be supported Outcome: Progressing   Problem: Respiratory: Goal: Will maintain a patent airway Outcome: Progressing Goal: Complications related to the disease process, condition or treatment will be avoided or minimized Outcome: Progressing   Problem: Education: Goal: Knowledge of General Education information will improve Description: Including pain rating scale, medication(s)/side effects and non-pharmacologic comfort measures Outcome: Progressing   Problem: Health Behavior/Discharge Planning: Goal: Ability to manage health-related needs will improve Outcome: Progressing   Problem: Clinical Measurements: Goal: Ability to maintain clinical measurements within normal limits will improve Outcome: Progressing Goal: Will remain free from infection Outcome: Progressing Goal: Diagnostic test results will improve Outcome: Progressing Goal: Respiratory complications will improve Outcome: Progressing Goal: Cardiovascular complication will be avoided Outcome: Progressing   Problem: Activity: Goal: Risk for activity intolerance will decrease Outcome: Progressing   Problem: Nutrition: Goal: Adequate nutrition will be maintained Outcome: Progressing   Problem: Coping: Goal: Level of anxiety will decrease Outcome: Progressing   Problem: Elimination: Goal: Will not experience complications related to bowel motility Outcome: Progressing   Problem: Pain Managment: Goal: General experience of comfort will improve Outcome: Progressing   Problem: Safety: Goal: Ability to remain free from injury will improve Outcome: Progressing   Problem: Skin Integrity: Goal: Risk for impaired skin integrity will decrease Outcome: Progressing   Problem: Activity: Goal: Ability to tolerate increased  activity will improve Outcome: Progressing   Problem: Respiratory: Goal: Ability to maintain a clear airway and adequate ventilation will improve Outcome: Progressing   Problem: Role Relationship: Goal: Method of communication will improve Outcome: Progressing   Problem: Education: Goal: Knowledge of disease and its progression will improve Outcome: Progressing Goal: Individualized Educational Video(s) Outcome: Progressing   Problem: Fluid Volume: Goal: Compliance with measures to maintain balanced fluid volume will improve Outcome: Progressing   Problem: Health Behavior/Discharge Planning: Goal: Ability to manage health-related needs will improve Outcome: Progressing   Problem: Nutritional: Goal: Ability to make healthy dietary choices will improve Outcome: Progressing   Problem: Clinical Measurements: Goal: Complications related to the disease process, condition or treatment will be avoided or minimized Outcome: Progressing   Problem: Activity: Goal: Ability to tolerate increased activity will improve Outcome: Progressing   Problem: Respiratory: Goal: Ability to maintain a clear airway and adequate ventilation will improve Outcome: Progressing   Problem: Role Relationship: Goal: Method of communication will improve Outcome: Progressing

## 2020-04-23 NOTE — Progress Notes (Signed)
PT Cancellation Note  Patient Details Name: Wanda Ramirez MRN: [id number] DOB: November 14, 1970   Cancelled Treatment:    Reason Eval/Treat Not Completed: Other (comment) (attempted to see post HD however RN giving meds and reports she will require at least 10 min to complete)   [name] B [name] 04/23/2020, 11:52 AM Bayard Males, PT Acute Rehabilitation Services Pager: [phone] Office: [phone]

## 2020-04-23 NOTE — Progress Notes (Addendum)
Physical Therapy Treatment Patient Details Name: Wanda Ramirez MRN: [id number] DOB: 03/14/1971 Today's Date: 04/23/2020    History of Present Illness 49 yo admitted 9/13 with Covid PNA, intubated 9/24-10/6, reintubated 10/8, CRRT 9/26-10/7, trach and bronch 10/18. Pt returned to vent 10/30-11/7. Pt with significant sacral wound to bone s/p I&D x 2. Pt with hemorrhage from wound 11/14, hemorrhagic shock 11/15. PMhx: ESRD TTS, obesity, HTN, GERD, hypothyroidism, DM, asthma    PT Comments    Pt awake, waving and very willing to participate on arrival. Pt had just been given seroquel, klonopin and oxy prior to session and unfortunately impacted pt during session with lethargy and inability to maintain attention limiting time in standing. Pt with good tolerance for bil LE in tilt to 40degrees today but difficult for pt to be comfortable with glute pain today. Pt continues to be motivated to progress function and demonstrated improved bil LE strength. Will continue to follow.   HR 115 with activity and SpO2 94-97% on RA and 28% trach collar end of session BP 144/99 in 40degree tilt    Follow Up Recommendations  LTACH;Supervision/Assistance - 24 hour     Equipment Recommendations  Wheelchair (measurements PT);Wheelchair cushion (measurements PT);Hospital bed;Other (comment)    Recommendations for Other Services       Precautions / Restrictions Precautions Precautions: Fall;Other (comment) Precaution Comments: trach, cortrak, flexiseal, sacral wound    Mobility  Bed Mobility Overal bed mobility: Needs Assistance Bed Mobility: Rolling Rolling: Min assist         General bed mobility comments: rolling bil with min assist for pericare and pad change  Transfers Overall transfer level: Needs assistance               General transfer comment: tilted to 40 degrees via bed with 2 straps at legs and cues for midline posture and pt continually leaning toward left and even rotating  trunk due to glute pain  Ambulation/Gait                 Stairs             Wheelchair Mobility    Modified Rankin (Stroke Patients Only)       Balance Overall balance assessment: Needs assistance       Postural control: Left lateral lean     Standing balance comment: 40 degree tilt with left lean                            Cognition Arousal/Alertness: Awake/alert Behavior During Therapy: Flat affect Overall Cognitive Status: Difficult to assess Area of Impairment: Attention;Following commands;Safety/judgement                     Memory: Decreased short-term memory Following Commands: Follows one step commands inconsistently;Follows one step commands with increased time Safety/Judgement: Decreased awareness of safety;Decreased awareness of deficits   Problem Solving: Requires verbal cues;Requires tactile cues General Comments: pt following commands inconsistently with mouthing words and decreased attention. Pt becoming drowsy with decreased attention with session progression suspect due to medication (klonopin, seroquel, oxycodone)      Exercises General Exercises - Lower Extremity Mini-Sqauts: AROM;Both;Standing;5 reps    General Comments        Pertinent Vitals/Pain Pain Assessment: Faces Pain Score: 5  Pain Location: sacrum with mobility Pain Descriptors / Indicators: Aching;Sore;Guarding Pain Intervention(s): Limited activity within patient's tolerance;Monitored during session;Premedicated before session;Repositioned    Home Living  Prior Function            PT Goals (current goals can now be found in the care plan section) Progress towards PT goals: Progressing toward goals    Frequency    Min 3X/week      PT Plan Current plan remains appropriate    Co-evaluation              AM-PAC PT "6 Clicks" Mobility   Outcome Measure  Help needed turning from your back to your  side while in a flat bed without using bedrails?: A Little Help needed moving from lying on your back to sitting on the side of a flat bed without using bedrails?: A Lot Help needed moving to and from a bed to a chair (including a wheelchair)?: Total Help needed standing up from a chair using your arms (e.g., wheelchair or bedside chair)?: Total Help needed to walk in hospital room?: Total Help needed climbing 3-5 steps with a railing? : Total 6 Click Score: 9    End of Session   Activity Tolerance: Patient tolerated treatment well Patient left: in bed;with call bell/phone within reach;with nursing/sitter in room;with family/visitor present Nurse Communication: Mobility status;Need for lift equipment;Precautions PT Visit Diagnosis: Muscle weakness (generalized) (M62.81);Difficulty in walking, not elsewhere classified (R26.2);Pain     Time: 3235-5732 PT Time Calculation (min) (ACUTE ONLY): 29 min  Charges:  $Therapeutic Activity: 23-37 mins                     Elin Fenley P, PT Acute Rehabilitation Services Pager: [phone] Office: [place] 04/23/2020, 1:07 PM

## 2020-04-24 DIAGNOSIS — U071 COVID-19: Secondary | ICD-10-CM | POA: Diagnosis not present

## 2020-04-24 LAB — GLUCOSE, CAPILLARY
Glucose-Capillary: 103 mg/dL — ABNORMAL HIGH (ref 70–99)
Glucose-Capillary: 119 mg/dL — ABNORMAL HIGH (ref 70–99)
Glucose-Capillary: 148 mg/dL — ABNORMAL HIGH (ref 70–99)
Glucose-Capillary: 156 mg/dL — ABNORMAL HIGH (ref 70–99)
Glucose-Capillary: 55 mg/dL — ABNORMAL LOW (ref 70–99)
Glucose-Capillary: 57 mg/dL — ABNORMAL LOW (ref 70–99)
Glucose-Capillary: 88 mg/dL (ref 70–99)

## 2020-04-24 LAB — RENAL FUNCTION PANEL
Albumin: 1.9 g/dL — ABNORMAL LOW (ref 3.5–5.0)
Anion gap: 15 (ref 5–15)
BUN: 62 mg/dL — ABNORMAL HIGH (ref 6–20)
CO2: 25 mmol/L (ref 22–32)
Calcium: 9.4 mg/dL (ref 8.9–10.3)
Chloride: 99 mmol/L (ref 98–111)
Creatinine, Ser: 3.63 mg/dL — ABNORMAL HIGH (ref 0.44–1.00)
GFR, Estimated: 15 mL/min — ABNORMAL LOW (ref >60)
Glucose, Bld: 112 mg/dL — ABNORMAL HIGH (ref 70–99)
Phosphorus: 4 mg/dL (ref 2.5–4.6)
Potassium: 3.6 mmol/L (ref 3.5–5.1)
Sodium: 139 mmol/L (ref 135–145)

## 2020-04-24 LAB — CBC
HCT: 25.2 % — ABNORMAL LOW (ref 36.0–46.0)
Hemoglobin: 8 g/dL — ABNORMAL LOW (ref 12.0–15.0)
MCH: 29.2 pg (ref 26.0–34.0)
MCHC: 31.7 g/dL (ref 30.0–36.0)
MCV: 92 fL (ref 80.0–100.0)
Platelets: 335 10*3/uL (ref 150–400)
RBC: 2.74 MIL/uL — ABNORMAL LOW (ref 3.87–5.11)
RDW: 14.1 % (ref 11.5–15.5)
WBC: 11 10*3/uL — ABNORMAL HIGH (ref 4.0–10.5)
nRBC: 0 % (ref 0.0–0.2)

## 2020-04-24 MED ORDER — SODIUM BICARBONATE 650 MG PO TABS
650.0000 mg | ORAL_TABLET | Freq: Once | ORAL | Status: AC
  Administered 2020-04-24: 650 mg
  Filled 2020-04-24: qty 1

## 2020-04-24 MED ORDER — CLONAZEPAM 0.25 MG PO TBDP
0.2500 mg | ORAL_TABLET | Freq: Two times a day (BID) | ORAL | Status: DC
  Administered 2020-04-24 – 2020-04-27 (×5): 0.25 mg
  Filled 2020-04-24 (×5): qty 1

## 2020-04-24 MED ORDER — PANTOPRAZOLE SODIUM 40 MG PO PACK
40.0000 mg | PACK | Freq: Every day | ORAL | Status: DC
  Administered 2020-04-24 – 2020-05-06 (×13): 40 mg
  Filled 2020-04-24 (×12): qty 20

## 2020-04-24 MED ORDER — PANCRELIPASE (LIP-PROT-AMYL) 10440-39150 UNITS PO TABS
20880.0000 [IU] | ORAL_TABLET | Freq: Once | ORAL | Status: AC
  Administered 2020-04-24: 20880 [IU]
  Filled 2020-04-24: qty 2

## 2020-04-24 NOTE — Progress Notes (Signed)
[facility] Progress Note   Assessment/ Plan:      OP HD: TTS  4h 74mn 450/800 2/2.25 bath 111.5kg Hep 2000 L AVF - darbe 50 ug q week, last 9/7 - calc 1.0 tiw - 9/9 Hb 10.0, tsat 22%  49yo female w/ ESRD on TTS HD admitted for COVID PNA on 02/13/20. She was intubated. SP CRRT around 9/26- 10/7. Now is on intermittent HD MWF here. She is sp trach. She had MSSA pna. DM2.Then she had large sacral decub requiring I&D.   1. COVID pna /sp trach - off vent on trach collar 2. Hypotension - BP's stable on midodrine 10 tid 3. Stage IV sacral decub:- sp I&D 11/2 and again 11/5 by gen surg. Surgery following. SP course of IV abx completed 11/12, getting hydrotherapy daily.  4. ESRD - switching back to TTS.sp CRRT 9/26- 10/7.HD Mon/ Wed/ Sat per holiday sched 5. Volume - no edema on exam now, small UF w/ next HD 4. HD access: LUA AVF clotted here during acute covid. TDC x2 per IR 5. Anemia ckd/ critical illness - on darbe 150 weekly, 11/12 Ferritin >4000, iron sat 11.  With active infection and ^^ ferritin no IV iron. 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 Nutrition - getting NG Nepro tube feeds, rena-vite 8. S/P MSSA HCAP/ VAP 9. DM2 - per pmd 10. Dispo: would need LTACH   Subjective:    Seen in room.  Just worked with PT and OT, sleepy.     Objective:   BP (!) 141/79 (BP Location: Right Leg)   Pulse (!) 106   Temp 97.8 F (36.6 C)   Resp 18   Ht '5\' 2"'  (1.575 m)   Wt 104.2 kg   SpO2 100%   BMI 42.02 kg/m   Physical Exam: Gen: lying in bed, on dialysis HEENT: trach and coretrak in place CVS: RRR Resp:coarse bilateral Abd: soft Ext: no LE edema SKIN: large sacral decub not examined today ACCESS: AVF  Labs: BMET Recent Labs  Lab 04/18/20 0818 04/20/20 0635 04/20/20 0838 04/21/20 0334 04/22/20 0144 04/23/20 0308 04/24/20 0341  NA 135 137 136 138 136 135 139  K 3.9 4.3 4.1 3.7 4.3 4.3 3.6  CL 97* 98 98 99 99  92* 99  CO2 '24 25 27 28 23 26 25  ' GLUCOSE 173* 132* 147* 102* 149* 130* 112*  BUN 67* 65* 51* 39* 72* 108* 62*  CREATININE 4.39* 4.19* 3.43* 2.79* 4.07* 5.40* 3.63*  CALCIUM 9.4 9.8 9.4 9.4 9.9 10.0 9.4  PHOS 7.0* 5.1* 4.2 3.7 5.5* 6.4* 4.0   CBC Recent Labs  Lab 04/21/20 0334 04/22/20 0144 04/23/20 0308 04/24/20 0341  WBC 11.6* 13.2* 12.7* 11.0*  HGB 8.9* 8.6* 8.4* 8.0*  HCT 27.6* 27.9* 26.4* 25.2*  MCV 92.3 93.9 93.6 92.0  PLT 338 322 463* 335      Medications:    . sodium chloride   Intravenous Once  . B-complex with vitamin C  1 tablet Per Tube Daily  . chlorhexidine gluconate (MEDLINE KIT)  15 mL Mouth Rinse BID  . Chlorhexidine Gluconate Cloth  6 each Topical Daily  . clonazepam  0.25 mg Per Tube TID  . collagenase   Topical Daily  . darbepoetin (ARANESP) injection - DIALYSIS  150 mcg Intravenous Q Wed-HD  . feeding supplement (PROSource TF)  45 mL Per Tube TID  . free water  20 mL Per Tube Q6H  .  hydrocerin   Topical Daily  . insulin aspart  0-20 Units Subcutaneous Q4H  . insulin aspart  7 Units Subcutaneous Q4H  . insulin detemir  25 Units Subcutaneous BID  . levothyroxine  125 mcg Per Tube Q0600  . midodrine  10 mg Per Tube TID WC  . multivitamin  1 tablet Oral QHS  . nutrition supplement (JUVEN)  1 packet Per Tube BID  . oxyCODONE  10 mg Per Tube Q6H  . pantoprazole sodium  40 mg Per Tube Daily  . QUEtiapine  50 mg Per Tube BID  . sodium chloride flush  10-40 mL Intracatheter Q12H  . Thrombi-Pad  1 each Topical Once     Madelon Lips, MD 04/24/2020, 10:09 AM

## 2020-04-24 NOTE — Progress Notes (Signed)
Nutrition Follow-up  DOCUMENTATION CODES:   Morbid obesity  INTERVENTION:  Continue tube feeds via Cortrak:  -Nepro at 50 ml/hr (1200 ml/day) - ProSource TF 45 ml TID  Tube feeding regimen at goal provides2280kcal, 130grams of protein, and 85m of free water daily.  Continue Juven BID via tube, each packet provides 80 calories, 8 grams of carbohydrate, 2.5grams of protein (collagen), 7 grams of L-arginine and 7 grams of L-glutamine; supplement contains CaHMB, Vitamins C, E, B12 and Zinc to promote wound healing.   Will monitor for results of FEES today and make adjustments to TF regimen as appropriate. If pt is to continue receiving TF, recommend placement of PEG (if aligned with [place].   NUTRITION DIAGNOSIS:   Inadequate oral intake related to inability to eat as evidenced by NPO status.  ongoing  GOAL:   Patient will meet greater than or equal to 90% of their needs  Met with TF   MONITOR:   Vent status, Labs, Weight trends, TF tolerance, Skin, I & O's  REASON FOR ASSESSMENT:   Consult Assessment of nutrition requirement/status  ASSESSMENT:   Pt with PMH significant for ESRD on HD, chronic hypoxic respiratory failure on 3L [place] at baseline, sleep apnea, DM, HTN, HLD, hypothyroidism, and asthma. Pt admitted 02/17/20 for COVID-19 pneumonia who developed chronic respiratory failure requiring a tracheostomy after an extended stay in ICU. Pt's hospitalization has been complicated by MSSA pneumonia as well as the development of a necrotizing soft tissue infection involving her gluteus secondary to MDR organisms. She was transferred to the IMTS 11/10.   [facility] available at [facility].   Pt remains on trach collar and continues to receive hydrotherapy. Per MD, there continues to be difficulty with goals of care conversation due to chronic respiratory failure and difficulty tolerating Passy-Muir valve. SLP continues to work  with pt and plans for FEES today. MD suspects it will be "quite some time" before pt can meet her nutrition needs orally. Pt currently receiving TF via Cortrak (gastric placement, placed 10/4) and has not received PEG. Will monitor for results of FEES and adjust TF as appropriate. If pt to continue to receive TF and if aligned with GOC, recommend placement of PEG.   Patient is currently receiving Nepro via Cortrak at 50 ml/h (1200 ml/day) with Prosource TF 45 ml TID to provide 2280 kcals, 130 gm protein, 872 ml free water daily.  Free water flushes 20 ml every 6 hours for a total of 952 ml per day.  Pt anuric I/O: +23,279.121msince admit  Admit weight: 124.7 kg Current weight: 104.2 kg EDW (per nephrology): 111.5 kg, currently below this weight, need new EDW  Last HD 11/22, net UF 2L  Labs: CBGs 88-124 Medications: B-complex with vitamin C, Aranesp, Novolog SSI q 4 hours, Novolog 7 units q 4 hours, Levemir 25 units BID, Rena-vit, Juven BID   Diet Order:   Diet Order            Diet NPO time specified  Diet effective now                 EDUCATION NEEDS:   Not appropriate for education at this time  Skin:  Skin Assessment: Skin Integrity Issues: Skin Integrity Issues:: Unstageable, Incisions Stage II: right neck Unstageable: mid sacrum Incisions: buttocks  Last BM:  11/22 type 7 via rectal tube  Height:   Ht Readings from Last 1 Encounters:  04/05/20 _0  (1.575 m)    Weight:   Wt Readings from Last 1 Encounters:  04/23/20 104.2 kg    Ideal Body Weight:  50 kg  BMI:  Body mass index is 42.02 kg/m.  Estimated Nutritional Needs:   Kcal:  2100-2300  Protein:  120-140 grams  Fluid:  1 L + UOP    Larkin Ina, MS, RD, LDN RD pager number and weekend/on-call pager number located in [place].

## 2020-04-24 NOTE — Progress Notes (Addendum)
Progress Note  21 Days Post-Op  Subjective: Seen with PT hydrotherapy today. Wound progressing well.   Objective: Vital signs in last 24 hours: Temp:  [97.8 F (36.6 C)-98.2 F (36.8 C)] 97.8 F (36.6 C) (11/22 2320) Pulse Rate:  [106-117] 109 (11/23 1232) Resp:  [13-22] 14 (11/23 1232) BP: (103-141)/(59-79) 110/75 (11/23 1232) SpO2:  [97 %-100 %] 98 % (11/23 1232) FiO2 (%):  [28 %] 28 % (11/23 1147) Last BM Date: 04/23/20  Intake/Output from previous day: 11/22 0701 - 11/23 0700 In: 850 [NG/GT:850] Out: 2000  Intake/Output this shift: No intake/output data recorded.  PE: Gen: Awake, NAD Pulm: Rate and effort normal  Wounds:Large sacral/left buttocks wound.Exposed sacrum in wound base with fibrinous exudate present. Healthy granulation tissue stable.Undermining extending left inferolaterally as seen in first photo, no purulence expressed from that tract but extends about 9 cm. Right lateral wound surface with less necrotic tissue and a pocket that extends about 5 cm as seen in 2nd photo. Inferior flap with eschar along distal edge.        Lab Results:  Recent Labs    04/23/20 0308 04/24/20 0341  WBC 12.7* 11.0*  HGB 8.4* 8.0*  HCT 26.4* 25.2*  PLT 463* 335   BMET Recent Labs    04/23/20 0308 04/24/20 0341  NA 135 139  K 4.3 3.6  CL 92* 99  CO2 26 25  GLUCOSE 130* 112*  BUN 108* 62*  CREATININE 5.40* 3.63*  CALCIUM 10.0 9.4   PT/INR No results for input(s): LABPROT, INR in the last 72 hours. CMP     Component Value Date/Time   NA 139 04/24/2020 0341   K 3.6 04/24/2020 0341   CL 99 04/24/2020 0341   CO2 25 04/24/2020 0341   GLUCOSE 112 (H) 04/24/2020 0341   BUN 62 (H) 04/24/2020 0341   CREATININE 3.63 (H) 04/24/2020 0341   CALCIUM 9.4 04/24/2020 0341   PROT 5.9 (L) 04/13/2020 0500   ALBUMIN 1.9 (L) 04/24/2020 0341   AST 25 04/13/2020 0500   ALT 25 04/13/2020 0500   ALKPHOS 316 (H) 04/13/2020 0500   BILITOT 0.8 04/13/2020 0500    GFRNONAA 15 (L) 04/24/2020 0341   GFRAA 33 (L) 03/05/2020 1703   Lipase  No results found for: LIPASE     Studies/Results: No results found.  Anti-infectives: Anti-infectives (From admission, onward)   Start     Dose/Rate Route Frequency Ordered Stop   04/12/20 1800  meropenem (MERREM) 500 mg in sodium chloride 0.9 % 100 mL IVPB  Status:  Discontinued        500 mg 200 mL/hr over 30 Minutes Intravenous Every 24 hours 04/12/20 1746 04/14/20 1426   04/06/20 1200  vancomycin (VANCOCIN) IVPB 1000 mg/200 mL premix        1,000 mg 200 mL/hr over 60 Minutes Intravenous Every Fri (Hemodialysis) 04/05/20 1529 04/06/20 1300   04/04/20 1200  vancomycin (VANCOCIN) IVPB 1000 mg/200 mL premix        1,000 mg 200 mL/hr over 60 Minutes Intravenous Every M-W-F (Hemodialysis) 04/04/20 1014 04/04/20 1423   04/03/20 0000  piperacillin-tazobactam (ZOSYN) IVPB 2.25 g  Status:  Discontinued        2.25 g 100 mL/hr over 30 Minutes Intravenous Every 8 hours 04/02/20 2253 04/09/20 1559   04/02/20 2345  vancomycin (VANCOCIN) IVPB 1000 mg/200 mL premix        1,000 mg 200 mL/hr over 60 Minutes Intravenous  Once 04/02/20 2251 04/03/20 0236  04/02/20 2251  vancomycin variable dose per unstable renal function (pharmacist dosing)  Status:  Discontinued         Does not apply See admin instructions 04/02/20 2251 04/07/20 1037   04/02/20 1200  ceFAZolin (ANCEF) IVPB 1 g/50 mL premix  Status:  Discontinued        1 g 100 mL/hr over 30 Minutes Intravenous Every 24 hours 04/02/20 1030 04/02/20 2251   04/01/20 1300  vancomycin (VANCOCIN) 2,000 mg in sodium chloride 0.9 % 500 mL IVPB  Status:  Discontinued        2,000 mg 250 mL/hr over 120 Minutes Intravenous  Once 04/01/20 1203 04/01/20 1203   04/01/20 1300  vancomycin (VANCOREADY) IVPB 2000 mg/400 mL        2,000 mg 200 mL/hr over 120 Minutes Intravenous Every 12 hours 04/01/20 1205 04/01/20 1715   04/01/20 1300  vancomycin (VANCOCIN) 2,000 mg in sodium  chloride 0.9 % 500 mL IVPB  Status:  Discontinued        2,000 mg 250 mL/hr over 120 Minutes Intravenous  Once 04/01/20 1207 04/01/20 1208   04/01/20 1245  vancomycin (VANCOCIN) 2,000 mg in sodium chloride 0.9 % 500 mL IVPB  Status:  Discontinued        2,000 mg 250 mL/hr over 120 Minutes Intravenous  Once 04/01/20 1154 04/01/20 1206   04/01/20 1154  vancomycin variable dose per unstable renal function (pharmacist dosing)  Status:  Discontinued         Does not apply See admin instructions 04/01/20 1154 04/02/20 1030   03/11/20 1200  cefTRIAXone (ROCEPHIN) 2 g in sodium chloride 0.9 % 100 mL IVPB        2 g 200 mL/hr over 30 Minutes Intravenous Every 24 hours 03/11/20 1107 03/17/20 1307   03/10/20 1200  piperacillin-tazobactam (ZOSYN) IVPB 3.375 g  Status:  Discontinued        3.375 g 12.5 mL/hr over 240 Minutes Intravenous Every 12 hours 03/10/20 1117 03/10/20 1245   03/10/20 1130  vancomycin (VANCOCIN) IVPB 1000 mg/200 mL premix        1,000 mg 200 mL/hr over 60 Minutes Intravenous  Once 03/10/20 1117 03/10/20 1258   03/10/20 1000  vancomycin variable dose per unstable renal function (pharmacist dosing)  Status:  Discontinued         Does not apply See admin instructions 03/09/20 1412 03/14/20 0810   03/09/20 2300  piperacillin-tazobactam (ZOSYN) IVPB 2.25 g  Status:  Discontinued        2.25 g 100 mL/hr over 30 Minutes Intravenous Every 8 hours 03/09/20 1412 03/10/20 1117   03/09/20 1415  piperacillin-tazobactam (ZOSYN) IVPB 3.375 g        3.375 g 100 mL/hr over 30 Minutes Intravenous  Once 03/09/20 1412 03/09/20 1537   03/09/20 1415  vancomycin (VANCOCIN) 2,250 mg in sodium chloride 0.9 % 500 mL IVPB        2,250 mg 250 mL/hr over 120 Minutes Intravenous  Once 03/09/20 1412 03/09/20 1639   02/28/20 2200  ceFEPIme (MAXIPIME) 1 g in sodium chloride 0.9 % 100 mL IVPB        1 g 200 mL/hr over 30 Minutes Intravenous Every 12 hours 02/28/20 1305 02/29/20 2220   02/28/20 0907  ceFEPIme  (MAXIPIME) 1 g in sodium chloride 0.9 % 100 mL IVPB  Status:  Discontinued        1 g 200 mL/hr over 30 Minutes Intravenous Every 24 hours 02/27/20 0939 02/28/20 1305  02/26/20 2200  ceFEPIme (MAXIPIME) 2 g in sodium chloride 0.9 % 100 mL IVPB  Status:  Discontinued        2 g 200 mL/hr over 30 Minutes Intravenous Every 12 hours 02/26/20 1519 02/27/20 0939   02/25/20 1200  ceFEPIme (MAXIPIME) 2 g in sodium chloride 0.9 % 100 mL IVPB  Status:  Discontinued        2 g 200 mL/hr over 30 Minutes Intravenous Every T-Th-Sa (Hemodialysis) 02/25/20 0955 02/26/20 1519   02/24/20 1904  azithromycin (ZITHROMAX) 500 mg in sodium chloride 0.9 % 250 mL IVPB  Status:  Discontinued        500 mg 250 mL/hr over 60 Minutes Intravenous Every 24 hours 02/24/20 1904 02/26/20 0815   02/24/20 1900  azithromycin (ZITHROMAX) 250 mg in dextrose 5 % 125 mL IVPB  Status:  Discontinued        250 mg 125 mL/hr over 60 Minutes Intravenous Every 24 hours 02/24/20 1845 02/24/20 1904   02/24/20 1800  ceFEPIme (MAXIPIME) 2 g in sodium chloride 0.9 % 100 mL IVPB  Status:  Discontinued        2 g 200 mL/hr over 30 Minutes Intravenous Every Fri (Hemodialysis) 02/24/20 1326 02/25/20 0942   02/22/20 1745  ceFAZolin (ANCEF) IVPB 2g/100 mL premix        2 g 200 mL/hr over 30 Minutes Intravenous  Once 02/22/20 1744 02/22/20 1931   02/22/20 0600  ceFAZolin (ANCEF) IVPB 2g/100 mL premix        2 g 200 mL/hr over 30 Minutes Intravenous To Radiology 02/21/20 1658 02/22/20 1900   02/22/20 0000  ceFAZolin (ANCEF) IVPB 1 g/50 mL premix  Status:  Discontinued        1 g 100 mL/hr over 30 Minutes Intravenous To Radiology 02/21/20 1645 02/21/20 1658   02/21/20 1800  azithromycin (ZITHROMAX) tablet 250 mg  Status:  Discontinued       "Followed by" Linked Group Details   250 mg Oral Daily-1800 02/20/20 1658 02/24/20 1852   02/21/20 1800  ceFEPIme (MAXIPIME) 2 g in sodium chloride 0.9 % 100 mL IVPB  Status:  Discontinued        2 g 200  mL/hr over 30 Minutes Intravenous Every T-Th-Sa (1800) 02/20/20 1737 02/24/20 1326   02/20/20 1800  azithromycin (ZITHROMAX) tablet 500 mg       "Followed by" Linked Group Details   500 mg Oral Daily 02/20/20 1658 02/20/20 1731   02/20/20 1745  ceFEPIme (MAXIPIME) 1 g in sodium chloride 0.9 % 100 mL IVPB        1 g 200 mL/hr over 30 Minutes Intravenous STAT 02/20/20 1737 02/21/20 0747   02/19/20 1145  remdesivir 100 mg in sodium chloride 0.9 % 100 mL IVPB        100 mg 200 mL/hr over 30 Minutes Intravenous Daily 02/19/20 1131 02/19/20 1443   02/15/20 1000  remdesivir 100 mg in sodium chloride 0.9 % 100 mL IVPB  Status:  Discontinued       "Followed by" Linked Group Details   100 mg 200 mL/hr over 30 Minutes Intravenous Daily 02/14/20 0944 02/14/20 0951   02/15/20 1000  remdesivir 100 mg in sodium chloride 0.9 % 100 mL IVPB        100 mg 200 mL/hr over 30 Minutes Intravenous Daily 02/14/20 0952 02/17/20 1102   02/14/20 1030  remdesivir 100 mg in sodium chloride 0.9 % 100 mL IVPB  100 mg 200 mL/hr over 30 Minutes Intravenous Every 30 min 02/14/20 2297 02/14/20 1129   02/14/20 0945  remdesivir 200 mg in sodium chloride 0.9% 250 mL IVPB  Status:  Discontinued       "Followed by" Linked Group Details   200 mg 580 mL/hr over 30 Minutes Intravenous Once 02/14/20 0944 02/14/20 0951       Assessment/Plan OSA Morbid obesityBMI 42.02 Hypothyroidism HTN GERD ESRDon HD DM Asthma Chronic respiratory failure 2/2 Covid-19 (diagnosed 02/09/20) S/p tracheostomy Code status DNR  Necrotizing soft tissue infection S/pincision and debridement of sacrum, left gluteus11/2 Dr. Bobbye Morton -POD#21 - Culture: E COLI and ENTEROCOCCUS FAECALIS. Treated with merrem and wound clean now, no further abx needed from a general surgery standpoint -wound has cleaned up well with hydrotherapy - I think it would be ok to stop hydrotherapy at this point if able to [facility] wound - attempt VAC tomorrow   -  if unable to place VAC, recommend hydrotherapy 3x per week and saline BID wet to dry dressing  - no further surgical debridement needed at this time, general surgery will sign off at this time  - I would recommend follow up at wound care center when patient discharged from [facility]    LOS: 70 days    Norm Parcel , [facility] 04/24/2020, 2:06 PM Please see Amion for pager number during day hours 7:00am-4:30pm

## 2020-04-24 NOTE — TOC Progression Note (Addendum)
Transition of Care [facility]) - Progression Note    Patient Details  Name: Wanda Ramirez MRN: [id number] Date of Birth: [birth year]  Transition of Care [facility]) CM/SW [place], RN Phone Number: [phone]  04/24/2020, 2:05 PM  Clinical Narrative:    CM received secure chat from Dr. Darrick Meigs inquiring if CM has started a bed search for placement. MD states that she thinks the patient would be a good candidate for LTACH. [facility] contacted [facility] and spoke with Wanda Ramirez. Wanda Ramirez to look at patients info and will call CM back.Message has also been left for Wanda Ramirez at [place]. CM will await return call. CM attempted to call daughter Wanda Ramirez @ [phone] with no answer. Message left for daughter to call CM.   1500 CM received call from [place] (daughter). CM informed Wanda Ramirez that MD is requesting bed search be initiated. Wanda Ramirez states that she was told that her mother was not ready a few days ago and now she is being told that she is ready. Wanda Ramirez feels that her mother is being rushed out. CM has explained to daughter that there is a process for discharge and that  paperwork and bed search will need to be initiated before bed avaliablity can be determined. Daughter is ok with bed search being initiated but only locally. Daughter states that it is very important to her to have her mother placed locally. Wanda Ramirez expresses that she is not sure that her mother is ready for discharge.  1520 CM received call from [facility] with [facility] who states that there is no bed availability. Wanda Ramirez from [facility] also states that there are no beds available for a trach hemodialysis patient. MD has been updates. CM will initiate bed search.     Expected Discharge Plan: Home/Self Care Barriers to Discharge: Continued Medical Work up  Expected Discharge Plan and Services Expected Discharge Plan: Home/Self Care   Discharge Planning Services: CM Consult   Living  arrangements for the past 2 months: Apartment                                       Social Determinants of Health (SDOH) Interventions    Readmission Risk Interventions No flowsheet data found.

## 2020-04-24 NOTE — Progress Notes (Signed)
  Speech Language Pathology Treatment:    Patient Details Name: Wanda Ramirez MRN: [id number] DOB: [birth year] Today's Date: 04/24/2020 Time:  -     Pt's FEES scheduled today at 11:00. Hydrotherapy planning to see her this afternoon    [name] M.Ed Risk analyst [phone] Office [phone]

## 2020-04-24 NOTE — Progress Notes (Signed)
NAME:  Wanda Ramirez, MRN:  [id number], DOB:  1970/09/27, LOS: 43 ADMISSION DATE:  02/13/2020   Interm history/ Subjective   No significant overnight events.  No complaints this morning.   Significant Hospital Events   9/14 Admission to general floor 9/23 Tunneled HD cath placed 9/26 Transferred to ICU for hypotension and CRRT 10/1 On CRRT and vasopressors 10/6 Extubated 10/18 tracheostomy performed 10/31 Restart vanc for staph in resp culture 11/2 Surgical debridement for worsening sacral wound 11/10 IMTS to assume care 11/11 febrile. merrem started. Surgery reconsulted 11/13 merrem stopped 11/14 hemodynamic significant sacral wound bleeding. Surgery placed stitch. 11/15 tx to ICU for hemorrhagic shock. Started on pressors. 11/17 transferred back to IMTS  Objective   Blood pressure (!) 141/79, pulse (!) 113, temperature 97.8 F (36.6 C), resp. rate 17, height 5\' 2"  (1.575 m), weight 104.2 kg, SpO2 100 %.     Intake/Output Summary (Last 24 hours) at 04/24/2020 1213 Last data filed at 04/24/2020 0300 Gross per 24 hour  Intake 850 ml  Output --  Net 850 ml   Filed Weights   04/23/20 0500 04/23/20 0718 04/23/20 1054  Weight: 106.1 kg 106.2 kg 104.2 kg    Examination: Gen: chronically ill appearing Pulm: upper respiratory sounds that improved with deep suctioning. Mucous clear/white.  GI: nontender. Rectal tube in place. Skin: dressing saturated but intact to sacral wound   Consults:  Nephrology General surgery Palliative care Pulm  Significant Diagnostic Tests:  9/13 CXR>> bilateral patchy airspace opacities 9/21 VQ scan>> no PE 9/29 echocardiogram>> normal LVEF, findings consistent with increased RV volume and fluid overload, moderately reduced RV function 10/28 CT abdomen/pelvis >>scattered ground glass and consolidative opacities in the lung bases. Cystic space in the anterior, subpleural RML with septations favoring complicated pneumatocele.  Non-obstructive punctuate nephrolithiasis 11/1 CT abdomen/pelvis>> persistent multifocal bibasilar airspace disease with scarring. Bullous changes seen in RML. Decubitus ulcer overlying the sacrococcygeal junction with extensive gas seen in the left gluteal region involving the muscle and fat. No evidence of abscess. Findings consistent with cellulitis and myositis. No evidence of body destruction to suggest osteomyelitis.  Micro Data:  9/13, 9/26, 10/8, 10/24, 11/2, 11/9 blood cultures>>no growth 10/29 resp culture>> MSSA, resistant to clinda, erythromycin, clinda 11/2 wound culture>> e.coli resistant to ampicillin, cephalosporins, bactrim, e. Faecalis pan sensitive  Antimicrobials:  Remdesivir 9/14>>9/19 Cefepime 9/20>>9/29 Azithromycin 9/20>>9/25 Zosyn 10/8, 11/1>11/8 Vanc 10/8, 10/31>>11/5 Rocephin>>10/10>>10/16 Merrem 11/11>>11/13  Procedures/lines  Tracheostomy shiley 6.0 XLT 10/18 Sacral wound debridement--11/2  Tunneled HD line 9/27>> Central line 11/2>> Rectal tube 10/2>> Cortrak 10/4>>  Central line 9/26-10/24 A-line 9/30>>10/13, 11/2-11/9 HD line 9/22-9/27 ETT 9/26-10/6 ETT 10/8-10/18  Labs    CBC Latest Ref Rng & Units 04/24/2020 04/23/2020 04/22/2020  WBC 4.0 - 10.5 K/uL 11.0(H) 12.7(H) 13.2(H)  Hemoglobin 12.0 - 15.0 g/dL 8.0(L) 8.4(L) 8.6(L)  Hematocrit 36 - 46 % 25.2(L) 26.4(L) 27.9(L)  Platelets 150 - 400 K/uL 335 463(H) 322   BMP Latest Ref Rng & Units 04/24/2020 04/23/2020 04/22/2020  Glucose 70 - 99 mg/dL 112(H) 130(H) 149(H)  BUN 6 - 20 mg/dL 62(H) 108(H) 72(H)  Creatinine 0.44 - 1.00 mg/dL 3.63(H) 5.40(H) 4.07(H)  Sodium 135 - 145 mmol/L 139 135 136  Potassium 3.5 - 5.1 mmol/L 3.6 4.3 4.3  Chloride 98 - 111 mmol/L 99 92(L) 99  CO2 22 - 32 mmol/L 25 26 23   Calcium 8.9 - 10.3 mg/dL 9.4 10.0 9.9    Summary  51 yof admitted 02/17/20 for COVID-19 pneumonia  who developed chronic respiratory failure requiring a tracheostomy after an extended stay in  ICU. Her hospitalization has been complicated by MSSA pneumonia as well as the development of a necrotizing soft tissue infection involving her gluteus secondary to MDR organisms. She was transferred to the IMTS 11/10.   She is medically stable for discharge to [facility] pending bed availability.  Assessment & Plan:  Principal Problem:   COVID-19 Active Problems:   End stage renal disease on dialysis ([place])   Diabetes mellitus type 2 in obese (HCC)   OSA (obstructive sleep apnea)   HCAP (healthcare-associated pneumonia)   Acute respiratory failure with hypoxemia (HCC)   Encephalopathy acute   ARDS (adult respiratory distress syndrome) (HCC)   Pressure injury of skin   Aspiration into airway   Status post tracheostomy ([place])   Fever   Septic shock (HCC)   Cellulitis of buttock   Palliative care encounter   Sacral wound  ICU delirium.  --continue seroquel 50mg  BID, decrease klonapin to BID  --delirium precautions.  ESRD on iHD MWF. AVF clotted off resulting in need for use of tunneled HD line. On midodrine 10 mg TID --Management per nephrology.   Anemia of CKD/critical illness . Hgb stable 8.6->8.4. On aranesp, midodrine.   Chronic hypoxic respiratory failure s/p tracheostomy secondary to COVID 19 pneumonia complicated by MSSA pneumonia--unable to be weaned from vent. Slight increase in oxygen requirement to 6L but due to HD this am 6.0 shiley XLT cuffless.  Plan: continue trach care. Management per trach team. 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 Plan -- Hydrotherapy per surgery recommendations.  -- continue frequent repositioning. wound care/dressing changes -- Pain management: oxycodone 10mg  q6h, prn fentanyl 13mcg with dressing changes -- PT/OT to work on mobility  Type 2 Diabetes. CBGs at goal. Continue levemir 25U BID, novolog 7U every 4h and resistant SSI.  High risk malnutrition. Tube  feeds via cortrak. Appreciate SLP's work on starting oral nutrition.  Hypothyroidism. Continue synthroid.  Goals of care. Full scope of care.  Best practice:  CODE STATUS: DNR Diet: tube feeds DVT for prophylaxis: heparin Social considerations/Family communication: will update marlena later today Dispo: She would be stable from our point to transition to an LTACH that could provide trach cares, perform HD, hydrotherapy and continue tube feeds.  Mitzi Hansen, MD Internal Medicine Resident PGY-2 [facility] Pager: [phone] 04/24/2020 12:53 PM    After 5pm on weekdays and 1pm on weekends: On Call Pager: [phone]

## 2020-04-24 NOTE — Progress Notes (Signed)
Occupational Therapy Treatment Patient Details Name: Wanda Ramirez MRN: [id number] DOB: [birth year] Today's Date: 04/24/2020    History of present illness 49 yo admitted 9/13 with Covid PNA, intubated 9/24-10/6, reintubated 10/8, CRRT 9/26-10/7, trach and bronch 10/18. Pt returned to vent 10/30-11/7. Pt with significant sacral wound to bone s/p I&D x 2. Pt with hemorrhage from wound 11/14, hemorrhagic shock 11/15. PMhx: ESRD TTS, obesity, HTN, GERD, hypothyroidism, DM, asthma   OT comments  Pt received in bed, pt declining mobility this session, agreeable to bed level session as she just finished working with PT. Pt completing oral care and washing face with minimal assistance, following commands consistently during these tasks. She required moderate assistance to apply lotion to UE and maxA to apply lotion to LE. Pt completed UE exercises following one step commands with increased time. Pt will continue to benefit from skilled OT services to maximize safety and independence with ADL/IADL and functional mobility. Will continue to follow acutely and progress as tolerated.    Follow Up Recommendations  LTACH;SNF    Equipment Recommendations  Other (comment);Hospital bed    Recommendations for Other Services      Precautions / Restrictions Precautions Precautions: Fall;Other (comment) Precaution Comments: trach, cortrak, flexiseal, sacral wound Required Braces or Orthoses: Other Brace Other Brace: bil prevalons (has a pressure blood blister on the back of her R heel.  Restrictions Weight Bearing Restrictions: No       Mobility Bed Mobility               General bed mobility comments: pt reports she just got comfy, agreeable to bed level session  Transfers                 General transfer comment: pt declined    Balance                                           ADL either performed or assessed with clinical judgement   ADL Overall ADL's :  Needs assistance/impaired Eating/Feeding: NPO   Grooming: Minimal assistance Grooming Details (indicate cue type and reason): pt brushed teeth, washed face after setup assistance Upper Body Bathing: Moderate assistance Upper Body Bathing Details (indicate cue type and reason): pt applied lotion to arms at bed level Lower Body Bathing: Maximal assistance Lower Body Bathing Details (indicate cue type and reason): pt applied lotion to LLE (hip to knee) while in sidelying position           Toilet Transfer Details (indicate cue type and reason): deferred    Toileting - Clothing Manipulation Details (indicate cue type and reason): did not assess       General ADL Comments: pt reporting fatigue as she just finished working with PT, agreeable to bed level session this date. Pt required minA for grooming and completed bed level, she demonstrated improvement with following commands consistenly during ADL tasks this session     Vision       Perception     Praxis      Cognition Arousal/Alertness: Awake/alert Behavior During Therapy: Flat affect Overall Cognitive Status: Difficult to assess Area of Impairment: Attention;Following commands;Safety/judgement;Awareness;Problem solving                   Current Attention Level: Selective   Following Commands: Follows multi-step commands inconsistently;Follows multi-step commands with increased time Safety/Judgement: Decreased awareness of safety;Decreased  awareness of deficits Awareness: Emergent Problem Solving: Requires verbal cues;Requires tactile cues General Comments: pt following one step commands consistently this session, pt reports fatigue at start of session. Pt correctly identified 5 items in the room.         Exercises Exercises: General Upper Extremity;Other exercises General Exercises - Upper Extremity Shoulder Flexion: 10 reps;AAROM;Left Shoulder Horizontal ABduction: AAROM;10 reps;Left Shoulder Horizontal  ADduction: AAROM;10 reps;Left Elbow Flexion: AAROM;Both;10 reps Elbow Extension: AAROM;Both;10 reps Wrist Flexion: PROM;Both;10 reps (supination and pronation as well) Other Exercises Other Exercises: bil UE forearm supination/pronation    Shoulder Instructions       General Comments VSS on vent trach 5l/min FiO2 28%    Pertinent Vitals/ Pain       Pain Assessment: Faces Faces Pain Scale: Hurts a little bit Pain Location: sacrum Pain Descriptors / Indicators: Aching;Sore;Guarding Pain Intervention(s): Monitored during session;Limited activity within patient's tolerance  Home Living                                          Prior Functioning/Environment              Frequency  Min 2X/week        Progress Toward Goals  OT Goals(current goals can now be found in the care plan section)  Progress towards OT goals: Progressing toward goals (pt declined mobility secondary to fatigue)  Acute Rehab OT Goals Patient Stated Goal: pt agreeable to work with therapy OT Goal Formulation: With patient Time For Goal Achievement: 05/08/20 Potential to Achieve Goals: Fair ADL Goals Pt Will Perform Grooming: with mod assist;sitting;bed level Pt Will Perform Upper Body Bathing: with mod assist;bed level Pt Will Perform Upper Body Dressing: with min guard assist;sitting Pt Will Transfer to Toilet: with min assist;stand pivot transfer Pt/caregiver will Perform Home Exercise Program: Increased strength;Increased ROM;Both right and left upper extremity;With minimal assist;With written HEP provided Additional ADL Goal #1: Pt will follow multistep commands with 75% accuracy and no more than min cues during ADL/functional task. Additional ADL Goal #2: Pt will tolerate bed mobility with modA+2 as precursor to EOB/OOB ADL.  Plan Discharge plan remains appropriate;Frequency remains appropriate    Co-evaluation                 AM-PAC OT "6 Clicks" Daily Activity      Outcome Measure   Help from another person eating meals?: Total Help from another person taking care of personal grooming?: A Lot Help from another person toileting, which includes using toliet, bedpan, or urinal?: Total Help from another person bathing (including washing, rinsing, drying)?: Total Help from another person to put on and taking off regular upper body clothing?: Total Help from another person to put on and taking off regular lower body clothing?: Total 6 Click Score: 7    End of Session Equipment Utilized During Treatment: Oxygen  OT Visit Diagnosis: Other abnormalities of gait and mobility (R26.89);Muscle weakness (generalized) (M62.81);Other symptoms and signs involving cognitive function   Activity Tolerance Patient limited by fatigue   Patient Left in bed;with call bell/phone within reach   Nurse Communication Mobility status        Time: 3762-8315 OT Time Calculation (min): 20 min  Charges: OT General Charges $OT Visit: 1 Visit OT Treatments $Self Care/Home Management : 8-22 mins  Helene Kelp OTR/L Acute Rehabilitation Services Office: [place] 04/24/2020,  10:28 AM

## 2020-04-24 NOTE — Progress Notes (Signed)
Physical Therapy Wound Treatment and Discharge Patient Details  Name: Wanda Ramirez MRN: [id number] Date of Birth: 1970/12/24  Today's Date: 04/24/2020 Time: 9937-1696 Time Calculation (min): 56 min  Subjective  Subjective: Appears painful at times but declines additional pain medication. Was premedicated prior to treatment.  Patient and Family Stated Goals: None stated at this time.  Date of Onset:  (Unknown) Prior Treatments: Dressing changes  Pain Score:  Pt appears mildly uncomfortable during treatment but overall tolerated well. Premedicated.   Wound Assessment  Pressure Injury 03/06/20 Sacrum Mid Unstageable - Full thickness tissue loss in which the base of the injury is covered by slough (yellow, tan, gray, green or brown) and/or eschar (tan, brown or black) in the wound bed. (Active)  Wound Image    04/24/20 1455  Dressing Type ABD;Barrier Film (skin prep);Gauze (Comment);Moist to dry 04/24/20 1455  Dressing Changed;Clean;Dry;Intact 04/24/20 1455  Dressing Change Frequency Daily 04/24/20 1455  State of Healing Early/partial granulation 04/24/20 1455  Site / Wound Assessment Red;Yellow;Black 04/24/20 1455  % Wound base Red or Granulating 75% 04/24/20 1455  % Wound base Yellow/Fibrinous Exudate 10% 04/24/20 1455  % Wound base Black/Eschar 5% 04/24/20 1455  % Wound base Other/Granulation Tissue (Comment) 10% 04/24/20 1455  Peri-wound Assessment Intact 04/24/20 0418  Wound Length (cm) 15 cm 04/20/20 1457  Wound Width (cm) 19 cm 04/20/20 1457  Wound Depth (cm) 8.2 cm 04/20/20 1457  Wound Surface Area (cm^2) 285 cm^2 04/20/20 1457  Wound Volume (cm^3) 2337 cm^3 04/20/20 1457  Tunneling (cm) 12.5 cm at 8:00, 5.3 cm tunnel at 5:00 04/20/20 1457  Undermining (cm) 6.7 cm from 6:00-8:00 04/14/20 1458  Margins Unattached edges (unapproximated) 04/24/20 1455  Drainage Amount Moderate 04/24/20 1455  Drainage Description Serosanguineous 04/24/20 1455  Treatment Debridement  (Selective);Hydrotherapy (Pulse lavage);Packing (Saline gauze) 04/24/20 1455   Santyl applied to wound bed prior to applying dressing.     Hydrotherapy Pulsed lavage therapy - wound location: sacrum Pulsed Lavage with Suction (psi): 12 psi Pulsed Lavage with Suction - Normal Saline Used: 1000 mL Pulsed Lavage Tip: Tip with splash shield Selective Debridement Selective Debridement - Location: sacrum Selective Debridement - Tools Used: Forceps;Scissors Selective Debridement - Tissue Removed: Yellow and brown necrotic tissue from 5:00 area and necrotic adipose from flap; black eschar over tunnel at 8:00   Wound Assessment and Plan  Wound Therapy - Assess/Plan/Recommendations Wound Therapy - Clinical Statement: Assessed wound with Claiborne Billings, PA-C. Hydrotherapy performed today but will sign off as wound is progressing nicely and ~15% necrotic at this time. If needs change, please reconsult.  Wound Therapy - Functional Problem List: Global weakness in the setting of COVID and extended hospital stay. Factors Delaying/Impairing Wound Healing: Incontinence;Infection - systemic/local;Immobility;Multiple medical problems;Polypharmacy;Diabetes Mellitus Hydrotherapy Plan: Debridement;Dressing change;Patient/family education;Pulsatile lavage with suction Wound Therapy - Frequency: 6X / week Wound Therapy - Follow Up Recommendations: Skilled nursing facility Wound Plan: See above  Wound Therapy Goals- Improve the function of patient's integumentary system by progressing the wound(s) through the phases of wound healing (inflammation - proliferation - remodeling) by: Decrease Necrotic Tissue to: 0 Decrease Necrotic Tissue - Progress: Partly met Increase Granulation Tissue to: 100% Increase Granulation Tissue - Progress: Partly met Improve Drainage Characteristics: Min;Serous Improve Drainage Characteristics - Progress: Not met Goals/treatment plan/discharge plan were made with and agreed upon by  patient/family: Yes Time For Goal Achievement: 7 days Wound Therapy - Potential for Goals: Excellent  Goals will be updated until maximal potential achieved or discharge criteria met.  Discharge  criteria: when goals achieved, discharge from hospital, MD decision/surgical intervention, no progress towards goals, refusal/missing three consecutive treatments without notification or medical reason.  GP     [name] 04/24/2020, 3:05 PM   Rolinda Roan, PT, DPT Acute Rehabilitation Services Pager: [phone] Office: [phone]

## 2020-04-24 NOTE — Procedures (Signed)
Objective Swallowing Evaluation: Type of Study: FEES-Fiberoptic Endoscopic Evaluation of Swallow   Patient Details  Name: Wanda Ramirez MRN: [id number] Date of Birth: 28-Jun-1970  Today's Date: 04/24/2020 Time: SLP Start Time (ACUTE ONLY): 1127 -SLP Stop Time (ACUTE ONLY): 1151  SLP Time Calculation (min) (ACUTE ONLY): 24 min   Past Medical History:  Past Medical History:  Diagnosis Date  . Arthritis   . Asthma   . Chronic back pain   . Diabetes mellitus ([place])   . ESRD (end stage renal disease) on dialysis (HCC)    TTS  . Essential hypertension   . GERD (gastroesophageal reflux disease)   . Glaucoma   . Hyperlipidemia   . Hypothyroidism   . Morbid obesity ([place])   . Peripheral neuropathy   . Sleep apnea    wears CPAP, does not know setting   Past Surgical History:  Past Surgical History:  Procedure Laterality Date  . AV FISTULA PLACEMENT Left 04/2014  . CARDIAC CATHETERIZATION N/A 03/17/2016   Procedure: Left Heart Cath and Coronary Angiography;  Surgeon: [name], MD;  Location: [place] [facility];  Service: Cardiovascular;  Laterality: N/A;  . INCISION AND DRAINAGE PERIRECTAL ABSCESS N/A 04/03/2020   Procedure: EXCISIONAL DEBRIDEMENT OF SACRAL AND GLUTEAL WOUNDS;  Surgeon: [name], MD;  Location: [facility];  Service: General;  Laterality: N/A;  . IR FLUORO GUIDE CV LINE LEFT  02/22/2020  . IR FLUORO GUIDE CV LINE LEFT  02/27/2020  . IR US GUIDE VASC ACCESS LEFT  02/22/2020  . KNEE SURGERY    . TUBAL LIGATION     HPI: 49 year old female with PMH of with end-stage renal disease on hemodialysis and chronic lung disease on chronic supplemental oxygen at 3 L/min. Admitted 9/13 with acute on chronic hypoxic respiratory failure due to COVID-19 pneumonia. Intubated 9/24-10/6, reintubated 10/8, CRRT 9/26-10/7, trach and bronch 10/18, trach collar 11/8. PMV trials began 10/27 with minimal toleration, SLP discharged 11/3 due to medical decline. Reconsulted 11/10. Pt  currently has cuffed Shiley 6   Subjective: pt appears anxious    Assessment / Plan / Recommendation  CHL IP CLINICAL IMPRESSIONS 04/24/2020  Clinical Impression Pt exhibited min-mild oropharyngeal dysphagia wearing PMV during FEES without penetration or aspiration observed. She needed much encouragment throughout due to discomfort with scope. Her left arytenoid cartilidge appeared signifiantly edematous and right true vocal cord was thick in appearance. Unable to fully adduct vocal cords. Surprisingly, amount of mucous in larynx/pharynx was minimal in contrast to audible congestion at bedside. Timing of swallow onset was adequate and vallecular, lateral channel and pyriform sinus residue was min-mild. Volitional cough expelled mucous through glottis, white/tan in color, no green dye detected. Even when challenged with straw sips thin she appeared to protect her airway. Masticated solid texture with mildy prolongation and intermittent hesitation of boluses before initiating propulsion. Recommend she initiate Dys 2 (fine chopped), thin liquids, wearing PMV. Positioning can be challenging due to large sacral wound. She prefers right side, elevate head of bed and utilize reverse Trendelenburg position. Supervision and feeding assist to control pace. ST will follow.            SLP Visit Diagnosis Dysphagia, oropharyngeal phase (R13.12)  Attention and concentration deficit following --  Frontal lobe and executive function deficit following --  Impact on safety and function --      CHL IP TREATMENT RECOMMENDATION 04/24/2020  Treatment Recommendations Therapy as outlined in treatment plan below     Prognosis  04/24/2020  Prognosis for Safe Diet Advancement Good  Barriers to Reach Goals Cognitive deficits;Severity of deficits  Barriers/Prognosis Comment --    CHL IP DIET RECOMMENDATION 04/24/2020  SLP Diet Recommendations Dysphagia 2 (Fine chop) solids;Thin liquid  Liquid Administration via  Cup;Straw  Medication Administration Crushed with puree  Compensations Minimize environmental distractions;Slow rate;Small sips/bites;Lingual sweep for clearance of pocketing;Clear throat intermittently  Postural Changes Seated upright at 90 degrees      CHL IP OTHER RECOMMENDATIONS 04/24/2020  Recommended Consults --  Oral Care Recommendations Oral care BID  Other Recommendations --      CHL IP FOLLOW UP RECOMMENDATIONS 04/24/2020  Follow up Recommendations LTACH;Skilled Nursing facility      [facility] IP FREQUENCY AND DURATION 04/24/2020  Speech Therapy Frequency (ACUTE ONLY) min 2x/week  Treatment Duration 2 weeks           CHL IP ORAL PHASE 04/24/2020  Oral Phase Impaired  Oral - Pudding Teaspoon --  Oral - Pudding Cup --  Oral - Honey Teaspoon Delayed oral transit  Oral - Honey Cup --  Oral - Nectar Teaspoon Delayed oral transit  Oral - Nectar Cup --  Oral - Nectar Straw --  Oral - Thin Teaspoon Decreased bolus cohesion  Oral - Thin Cup --  Oral - Thin Straw Delayed oral transit  Oral - Puree Delayed oral transit  Oral - Mech Soft --  Oral - Regular (No Data)  Oral - Multi-Consistency --  Oral - Pill --  Oral Phase - Comment --    CHL IP PHARYNGEAL PHASE 04/24/2020  Pharyngeal Phase Impaired  Pharyngeal- Pudding Teaspoon --  Pharyngeal --  Pharyngeal- Pudding Cup --  Pharyngeal --  Pharyngeal- Honey Teaspoon Lateral channel residue;Pharyngeal residue - pyriform  Pharyngeal --  Pharyngeal- Honey Cup --  Pharyngeal --  Pharyngeal- Nectar Teaspoon Pharyngeal residue - pyriform;Lateral channel residue  Pharyngeal --  Pharyngeal- Nectar Cup --  Pharyngeal --  Pharyngeal- Nectar Straw --  Pharyngeal --  Pharyngeal- Thin Teaspoon Pharyngeal residue - valleculae  Pharyngeal --  Pharyngeal- Thin Cup --  Pharyngeal --  Pharyngeal- Thin Straw Pharyngeal residue - valleculae  Pharyngeal --  Pharyngeal- Puree Pharyngeal residue - pyriform  Pharyngeal --   Pharyngeal- Mechanical Soft --  Pharyngeal --  Pharyngeal- Regular Pharyngeal residue - valleculae  Pharyngeal --  Pharyngeal- Multi-consistency --  Pharyngeal --  Pharyngeal- Pill --  Pharyngeal --  Pharyngeal Comment --     CHL IP CERVICAL ESOPHAGEAL PHASE 04/24/2020  Cervical Esophageal Phase WFL  Pudding Teaspoon --  Pudding Cup --  Honey Teaspoon --  Honey Cup --  Nectar Teaspoon --  Nectar Cup --  Nectar Straw --  Thin Teaspoon --  Thin Cup --  Thin Straw --  Puree --  Mechanical Soft --  Regular --  Multi-consistency --  Pill --  Cervical Esophageal Comment --     [name] 04/24/2020, 1:03 PM Orbie Pyo Colvin Caroli.Ed Risk analyst [phone] Office [phone]

## 2020-04-24 NOTE — Progress Notes (Signed)
NAME:  Wanda Ramirez, MRN:  [id number], DOB:  12-21-1970, LOS: 49 ADMISSION DATE:  02/13/2020, CONSULTATION DATE:  9/24 REFERRING MD:  Heber Mariposa, CHIEF COMPLAINT:  Dyspnea   Brief History   49 yo female admitted with COVID 19 pneumonia on 9/13, had been diagnosed with COVID on 9/9.  On 9/24 her condition worsened and PCCM was consulted, she was sent to the ICU and treated with BIPAP, then eventually intubated.  Started on CRRT through a tunneled HD cath. Extubated on 10/6 but re-intubated on 10/8. Now sp trach tolerating trach collar.   Past Medical History  Sleep apnea Morbid obesity Hypothyroidism Hypertension GERD  End-stage renal disease on HD MWF Diabetes Asthma  Significant Hospital Events   9/14 admit 9/25 on BiPAP with Precedex overnight 9/26 -unable to run CRRT with prone ventilation.  Resume supine ventilation but still unable to draw blood back. 9/27 transferred to 64m. IR replaced HD catheter 10/1 on CRRT and vasopressors 10/6 - extubated 10/7 - had some stridor and increased WOB so placed on BIPAP and dexamethasone given.  10/15 weaning sedation. midaz off 10/16 move to 63M 10/18 s/p 6 proximal XLT Shiley tracheostomy  10/19: Resumed Eliquis low dose. Per Dr. Thompson Caul note 10/7 will need to be on [facility] for 1 month then reasses 10/19: Episode of hypoglycemia 55. 10/21 hypoglycemic again. levemir and tubefeed coverage decreased. Low grade temp->sputum culture send had vomiting event on 10/19 w/ concern for aspiration. Fent was stopped 10/20. Still gets anxious at night. Required increased precedex. Added clonazepam. 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 Surgery placed stitch. 11/15 hypotensive. Transferred back on ICU for pressor support 11/16 Off pressors Consults:  Nephrology General Surgery Palliative Care Procedures:  9/23 tunneled HD cath placement >  10/8 ETT > 10/18 10/18 6 proximal XLT Shiley tracheostomy  > 11/2 Sacral wound debridement Significant Diagnostic Tests:  9/21 Vas ultrasound> thrombosed LUE fistula 9/21 VQ scan > negative for PE 9/29 echo> LVEF 60-65%, flat ventrcile consistent with RV pressure overload, RV severely enlarged, moderate reduction in RV function  Micro Data:  9/13 blood > neg   9/14 sars cov 2 > pos 9/26 blood > neg 10/8 resp > MSSA 10/8 blood > negative  10/21 sputum > Normal flora  10/25 Blood > ngtd x 3 days 10/25 Sputum  > few diphtheroids  10/25 Blood >>negative 10/29 Sputum >> Staph aureus 11/2 Blood>> negative 11/2 sputum >> negative 11/2 Wound >> E. Coli, enterococcus faecalis  11/15 Tracheal aspirate >> few gram negative rods, moderate gram variable rods Antimicrobials:  9/14 remdesivir > 9/19 9/15 tocilizumab > x1 9/20 cefepime > 9/29 9/20 azithro >  9/25 10/8 vanc > 10/9 10/8 zosyn > x1 10/10 ceftriaxone > 10/16 10/31 vancomycin >> 1/7 11/2 zosyn>>11/8 11/11 Merrem >>11/13 Interim history/subjective:  Lying in bed. Communicative. Denies breathing issues.  Objective   Blood pressure 116/78, pulse (!) 101, temperature 97.8 F (36.6 C), temperature source Oral, resp. rate 15, height 5\' 2"  (1.575 m), weight 104.2 kg, SpO2 100 %.    FiO2 (%):  [28 %] 28 %  Intake/Output Summary (Last 24 hours) at 04/24/2020 1737 Last data filed at 04/24/2020 0300 Gross per 24 hour  Intake 400 ml  Output --  Net 400 ml   Filed Weights   04/23/20 0500 04/23/20 0718 04/23/20 1054  Weight: 106.1 kg 106.2 kg 104.2 kg   Examination: General: Frail  female, appears  chronically ill HEENT: 6 uncuffed proximal XLT Shiley, MM pink/moist, PERRL,  Neuro: Alert and awake, following simple commands, interactive  CV: Tachycardic, no murmur, rubs, or gallops,  PULM: Clear to auscultation bilaterally, no increased work of breathing, trach appears well-positioned with no secretions   Resolved Hospital Problem list   Thrombocytopenia  MSSA PNA  Nausea  Agitated delirium Shock  Assessment & Plan:     Acute hypoxic respiratory failure from COVID 19 pneumonia with ARDS complicated by MSSA PNA. Failure to wean s/p tracheostomy Tracheostomy placed 10/18.  P: Patient has tolerated transition to trach collar well Continue #6 uncuffed with secretions, consider down size given strong cough, may help with swallowing issues Routine trach care Encourage pulmonary hygiene As needed bronchodilators   Best practice:  Per primary  Labs:   CMP Latest Ref Rng & Units 04/24/2020 04/23/2020 04/22/2020  Glucose 70 - 99 mg/dL 112(H) 130(H) 149(H)  BUN 6 - 20 mg/dL 62(H) 108(H) 72(H)  Creatinine 0.44 - 1.00 mg/dL 3.63(H) 5.40(H) 4.07(H)  Sodium 135 - 145 mmol/L 139 135 136  Potassium 3.5 - 5.1 mmol/L 3.6 4.3 4.3  Chloride 98 - 111 mmol/L 99 92(L) 99  CO2 22 - 32 mmol/L 25 26 23   Calcium 8.9 - 10.3 mg/dL 9.4 10.0 9.9  Total Protein 6.5 - 8.1 g/dL - - -  Total Bilirubin 0.3 - 1.2 mg/dL - - -  Alkaline Phos 38 - 126 U/L - - -  AST 15 - 41 U/L - - -  ALT 0 - 44 U/L - - -    CBC Latest Ref Rng & Units 04/24/2020 04/23/2020 04/22/2020  WBC 4.0 - 10.5 K/uL 11.0(H) 12.7(H) 13.2(H)  Hemoglobin 12.0 - 15.0 g/dL 8.0(L) 8.4(L) 8.6(L)  Hematocrit 36 - 46 % 25.2(L) 26.4(L) 27.9(L)  Platelets 150 - 400 K/uL 335 463(H) 322    ABG    Component Value Date/Time   PHART 7.250 (L) 04/03/2020 1030   PCO2ART 64.8 (H) 04/03/2020 1030   PO2ART 156 (H) 04/03/2020 1030   HCO3 28.4 (H) 04/03/2020 1030   TCO2 30 04/03/2020 1030   ACIDBASEDEF 2.0 04/03/2020 0907   O2SAT 99.0  04/03/2020 1030    CBG (last 3)  Recent Labs    04/24/20 0748 04/24/20 1143 04/24/20 1643  GLUCAP 88 119* 156*   J

## 2020-04-24 NOTE — Progress Notes (Signed)
Spoke to [employer] regarding order to discontinue central line. Pt has been assessed by 3 [facility]AST RN's for peripheral IV access; pt does not have an appropriate vein for this. Recommended considering leaving current CVAD in place or placing new CVAD if there is a continued need for IV medications/fluids.

## 2020-04-24 NOTE — Progress Notes (Signed)
Physical Therapy Treatment Patient Details Name: Wanda Ramirez MRN: [id number] DOB: September 04, 1970 Today's Date: 04/24/2020    History of Present Illness 49 yo admitted 9/13 with Covid PNA, intubated 9/24-10/6, reintubated 10/8, CRRT 9/26-10/7, trach and bronch 10/18. Pt returned to vent 10/30-11/7. Pt with significant sacral wound to bone s/p I&D x 2. Pt with hemorrhage from wound 11/14, hemorrhagic shock 11/15. PMhx: ESRD TTS, obesity, HTN, GERD, hypothyroidism, DM, asthma    PT Comments    Pt found in room with bed unplugged and pressure relieving mattress not on. Pt unable to participate in Tilt Bed therapy today secondary to R upper railing being broken. Due to this, PT consisted of bed mobility, LE exercise, and sitting balance. Pt was able to maintain dynamic sitting balance with min guard. Pt reposition on R side to provide pressure relief.  Once Tilt Bed has been fixed, pt would benefit from further progression with it to further closed kinetic chain exercise and weight acceptance.  Follow Up Recommendations  LTACH;Supervision/Assistance - 24 hour     Equipment Recommendations  Wheelchair (measurements PT);Wheelchair cushion (measurements PT);Hospital bed;Other (comment)    Recommendations for Other Services       Precautions / Restrictions Precautions Precautions: Fall;Other (comment) Precaution Comments: trach, cortrak, flexiseal, sacral wound Required Braces or Orthoses: Other Brace Other Brace: bil prevalons Restrictions Weight Bearing Restrictions: No    Mobility  Bed Mobility Overal bed mobility: Needs Assistance Bed Mobility: Rolling;Supine to Sit;Sit to Supine Rolling: Min assist   Supine to sit: Mod assist;+2 for physical assistance;HOB elevated;+2 for safety/equipment Sit to supine: Mod assist;+2 for physical assistance;+2 for safety/equipment;HOB elevated   General bed mobility comments: pt found to be laying on L side and against L bedrail  Transfers                  General transfer comment: not attempted  Ambulation/Gait             General Gait Details: not attempted   Stairs             Wheelchair Mobility    Modified Rankin (Stroke Patients Only)       Balance Overall balance assessment: Needs assistance Sitting-balance support: Feet unsupported;Bilateral upper extremity supported Sitting balance-Leahy Scale: Fair Sitting balance - Comments: EOB >5 min with no LE support but bilateral UE support, pt able to maintain trunk control and was min guard x1. pt able to maintain trunk control while performing LE exercises     Standing balance-Leahy Scale: Zero                              Cognition Arousal/Alertness: Awake/alert Behavior During Therapy: Flat affect Overall Cognitive Status: Difficult to assess Area of Impairment: Attention;Following commands;Safety/judgement;Awareness;Problem solving                   Current Attention Level: Selective Memory: Decreased short-term memory Following Commands: Follows one step commands with increased time;Follows multi-step commands consistently Safety/Judgement: Decreased awareness of safety;Decreased awareness of deficits Awareness: Emergent Problem Solving: Requires verbal cues;Requires tactile cues;Slow processing General Comments: pt initially lethargic from being woken up, pt needed increased time at beginning of session to perform bed mobility      Exercises General exercises- Lower Extremity:  Long Arc Quad: AROM; Both: 10 reps: Seated Hip flexion/Marching: AAROM; Both; 10 reps; Seated Heel slides: AROM; Both; 10 reps; Supine    General Comments General comments (skin  integrity, edema, etc.): VSS on vent trach 5l/min FiO2 28%      Pertinent Vitals/Pain Pain Assessment: Faces Faces Pain Scale: Hurts a little bit Pain Location:  (nasal cavity during FEEES) Pain Descriptors / Indicators: Aching;Sore;Guarding Pain  Intervention(s): Monitored during session    Home Living                      Prior Function            PT Goals (current goals can now be found in the care plan section) Acute Rehab PT Goals Patient Stated Goal: pt agreeable to work with therapy Progress towards PT goals: Progressing toward goals    Frequency    Min 3X/week      PT Plan Current plan remains appropriate    Co-evaluation              AM-PAC PT "6 Clicks" Mobility   Outcome Measure  Help needed turning from your back to your side while in a flat bed without using bedrails?: A Little Help needed moving from lying on your back to sitting on the side of a flat bed without using bedrails?: A Lot Help needed moving to and from a bed to a chair (including a wheelchair)?: Total Help needed standing up from a chair using your arms (e.g., wheelchair or bedside chair)?: Total Help needed to walk in hospital room?: Total Help needed climbing 3-5 steps with a railing? : Total 6 Click Score: 9    End of Session   Activity Tolerance: Patient tolerated treatment well Patient left: in bed;with call bell/phone within reach;with nursing/sitter in room;with family/visitor present Nurse Communication: Mobility status;Precautions;Need for lift equipment (broken Tilt bed and that maintenance has been called) PT Visit Diagnosis: Muscle weakness (generalized) (M62.81);Difficulty in walking, not elsewhere classified (R26.2);Pain     Time: 3005-1102 PT Time Calculation (min) (ACUTE ONLY): 50 min  Charges:  $Therapeutic Exercise: 8-22 mins $Therapeutic Activity: 23-37 mins                     Caleb Popp, SPT [id number]   Wanda Ramirez 04/24/2020, 12:43 PM

## 2020-04-25 LAB — RENAL FUNCTION PANEL
Albumin: 2 g/dL — ABNORMAL LOW (ref 3.5–5.0)
Anion gap: 16 — ABNORMAL HIGH (ref 5–15)
BUN: 93 mg/dL — ABNORMAL HIGH (ref 6–20)
CO2: 24 mmol/L (ref 22–32)
Calcium: 10.4 mg/dL — ABNORMAL HIGH (ref 8.9–10.3)
Chloride: 98 mmol/L (ref 98–111)
Creatinine, Ser: 5 mg/dL — ABNORMAL HIGH (ref 0.44–1.00)
GFR, Estimated: 10 mL/min — ABNORMAL LOW (ref >60)
Glucose, Bld: 151 mg/dL — ABNORMAL HIGH (ref 70–99)
Phosphorus: 5.2 mg/dL — ABNORMAL HIGH (ref 2.5–4.6)
Potassium: 3.7 mmol/L (ref 3.5–5.1)
Sodium: 138 mmol/L (ref 135–145)

## 2020-04-25 LAB — GLUCOSE, CAPILLARY
Glucose-Capillary: 109 mg/dL — ABNORMAL HIGH (ref 70–99)
Glucose-Capillary: 114 mg/dL — ABNORMAL HIGH (ref 70–99)
Glucose-Capillary: 131 mg/dL — ABNORMAL HIGH (ref 70–99)
Glucose-Capillary: 170 mg/dL — ABNORMAL HIGH (ref 70–99)
Glucose-Capillary: 68 mg/dL — ABNORMAL LOW (ref 70–99)
Glucose-Capillary: 98 mg/dL (ref 70–99)

## 2020-04-25 LAB — CBC
HCT: 24.7 % — ABNORMAL LOW (ref 36.0–46.0)
Hemoglobin: 7.9 g/dL — ABNORMAL LOW (ref 12.0–15.0)
MCH: 29.6 pg (ref 26.0–34.0)
MCHC: 32 g/dL (ref 30.0–36.0)
MCV: 92.5 fL (ref 80.0–100.0)
Platelets: 399 10*3/uL (ref 150–400)
RBC: 2.67 MIL/uL — ABNORMAL LOW (ref 3.87–5.11)
RDW: 13.7 % (ref 11.5–15.5)
WBC: 11.7 10*3/uL — ABNORMAL HIGH (ref 4.0–10.5)
nRBC: 0 % (ref 0.0–0.2)

## 2020-04-25 MED ORDER — HEPARIN SODIUM (PORCINE) 1000 UNIT/ML IJ SOLN
INTRAMUSCULAR | Status: AC
  Filled 2020-04-25: qty 5

## 2020-04-25 MED ORDER — INSULIN DETEMIR 100 UNIT/ML ~~LOC~~ SOLN
40.0000 [IU] | Freq: Every day | SUBCUTANEOUS | Status: DC
  Filled 2020-04-25: qty 0.4

## 2020-04-25 MED ORDER — HEPARIN SODIUM (PORCINE) 1000 UNIT/ML IJ SOLN
INTRAMUSCULAR | Status: AC
  Administered 2020-04-25: 1000 [IU]
  Filled 2020-04-25: qty 2

## 2020-04-25 MED ORDER — RENA-VITE PO TABS
1.0000 | ORAL_TABLET | Freq: Every day | ORAL | Status: DC
  Administered 2020-04-25 – 2020-05-08 (×14): 1
  Filled 2020-04-25 (×15): qty 1

## 2020-04-25 MED ORDER — DARBEPOETIN ALFA 150 MCG/0.3ML IJ SOSY
PREFILLED_SYRINGE | INTRAMUSCULAR | Status: AC
  Administered 2020-04-25: 150 ug
  Filled 2020-04-25: qty 0.3

## 2020-04-25 MED ORDER — INSULIN GLARGINE 100 UNIT/ML ~~LOC~~ SOLN
40.0000 [IU] | Freq: Every day | SUBCUTANEOUS | Status: DC
  Administered 2020-04-25 – 2020-04-27 (×3): 40 [IU] via SUBCUTANEOUS
  Filled 2020-04-25 (×4): qty 0.4

## 2020-04-25 NOTE — Progress Notes (Signed)
SLP Cancellation Note  Patient Details Name: Wanda Ramirez MRN: [id number] DOB: 1970/12/07   Cancelled treatment:        Pt in HD this morning. Spoke with RN who reports she received a wound vac today as well and has not been eating. SLP will continue efforts to observe with po's after yesterday's FEES.   [name] 04/25/2020, 4:53 PM

## 2020-04-25 NOTE — Progress Notes (Addendum)
NAME:  Wanda Ramirez, MRN:  [id number], DOB:  27-Feb-1971, LOS: 71 ADMISSION DATE:  02/13/2020   Interm history/ Subjective   Nephrology notes reviewed--no new changes  SLP notes reviewed--Underwent Fiberoptic endoscopic eval by SLP yesterday--no aspiration appreciated. SLP recommending Dysphagia 2 diet, thin liquids when wearing PMV.  Case manager notes reviewed--. I had reached out to CM to discuss looking for bed placement. CM spoke with daughter who is requesting local beds only. No beds currently available at [facility]  Surgery notes reviewed--notes that wound is granulating nicely. Planning to place wound vac today. Sugery signing off  Pulm notes reviewed--recommending trying to size down trach to help with swallowing.  Attempted to update pt's daughter, [name], yesterday afternoon--no answer.  No significant overnight events.    Significant Hospital Events   9/14 Admission to general floor 9/23 Tunneled HD cath placed 9/26 Transferred to ICU for hypotension and CRRT 10/1 On CRRT and vasopressors 10/6 Extubated 10/18 tracheostomy performed 10/31 Restart vanc for staph in resp culture 11/2 Surgical debridement for worsening sacral wound 11/10 IMTS to assume care 11/11 febrile. merrem started. Surgery reconsulted 11/13 merrem stopped 11/14 hemodynamic significant sacral wound bleeding. Surgery placed stitch. 11/15 tx to ICU for hemorrhagic shock. Started on pressors. 11/17 transferred back to IMTS  Objective   Blood pressure 128/77, pulse (!) 109, temperature 97.7 F (36.5 C), temperature source Oral, resp. rate 15, height 5\' 2"  (1.575 m), weight 104.2 kg, SpO2 99 %.    No intake or output data in the 24 hours ending 04/25/20 0715 Filed Weights   04/23/20 0500 04/23/20 0718 04/23/20 1054  Weight: 106.1 kg 106.2 kg 104.2 kg    Examination: General: chronically ill appearing HEENT: voice is getting stronger Pulm: had a coughing spell this morning that sounded mucous  filled. Remains on 5L supplemental oxygen with the trach mask. Upper respiratory sounds appreciated on auscultation.  Consults:  Nephrology General surgery Palliative care Pulm  Significant Diagnostic Tests:  9/13 CXR>> bilateral patchy airspace opacities 9/21 VQ scan>> no PE 9/29 echocardiogram>> normal LVEF, findings consistent with increased RV volume and fluid overload, moderately reduced RV function 10/28 CT abdomen/pelvis >>scattered ground glass and consolidative opacities in the lung bases. Cystic space in the anterior, subpleural RML with septations favoring complicated pneumatocele. Non-obstructive punctuate nephrolithiasis 11/1 CT abdomen/pelvis>> persistent multifocal bibasilar airspace disease with scarring. Bullous changes seen in RML. Decubitus ulcer overlying the sacrococcygeal junction with extensive gas seen in the left gluteal region involving the muscle and fat. No evidence of abscess. Findings consistent with cellulitis and myositis. No evidence of body destruction to suggest osteomyelitis.  Micro Data:  9/13, 9/26, 10/8, 10/24, 11/2, 11/9 blood cultures>>no growth 10/29 resp culture>> MSSA, resistant to clinda, erythromycin, clinda 11/2 wound culture>> e.coli resistant to ampicillin, cephalosporins, bactrim, e. Faecalis pan sensitive  Antimicrobials:  Remdesivir 9/14>>9/19 Cefepime 9/20>>9/29 Azithromycin 9/20>>9/25 Zosyn 10/8, 11/1>11/8 Vanc 10/8, 10/31>>11/5 Rocephin>>10/10>>10/16 Merrem 11/11>>11/13  Procedures/lines  Tracheostomy shiley 6.0 XLT 10/18 Sacral wound debridement--11/2  Tunneled HD line 9/27>> Central line 11/2>> Rectal tube 10/2>> Cortrak 10/4>>  Central line 9/26-10/24 A-line 9/30>>10/13, 11/2-11/9 HD line 9/22-9/27 ETT 9/26-10/6 ETT 10/8-10/18  Labs    CBC Latest Ref Rng & Units 04/24/2020 04/23/2020 04/22/2020  WBC 4.0 - 10.5 K/uL 11.0(H) 12.7(H) 13.2(H)  Hemoglobin 12.0 - 15.0 g/dL 8.0(L) 8.4(L) 8.6(L)  Hematocrit 36 - 46 %  25.2(L) 26.4(L) 27.9(L)  Platelets 150 - 400 K/uL 335 463(H) 322   BMP Latest Ref Rng & Units 04/25/2020 04/24/2020  04/23/2020  Glucose 70 - 99 mg/dL 151(H) 112(H) 130(H)  BUN 6 - 20 mg/dL 93(H) 62(H) 108(H)  Creatinine 0.44 - 1.00 mg/dL 5.00(H) 3.63(H) 5.40(H)  Sodium 135 - 145 mmol/L 138 139 135  Potassium 3.5 - 5.1 mmol/L 3.7 3.6 4.3  Chloride 98 - 111 mmol/L 98 99 92(L)  CO2 22 - 32 mmol/L 24 25 26   Calcium 8.9 - 10.3 mg/dL 10.4(H) 9.4 10.0    Summary  7 yof admitted 02/17/20 for COVID-19 pneumonia who developed chronic respiratory failure requiring a tracheostomy after an extended stay in ICU. Her hospitalization has been complicated by MSSA pneumonia as well as the development of a necrotizing soft tissue infection involving her gluteus secondary to MDR organisms. She was transferred to the IMTS 11/10.   She is medically stable for discharge to [facility] pending bed availability.  Assessment & Plan:  Principal Problem:   COVID-19 Active Problems:   End stage renal disease on dialysis ([place])   Diabetes mellitus type 2 in obese (HCC)   OSA (obstructive sleep apnea)   HCAP (healthcare-associated pneumonia)   Acute respiratory failure with hypoxemia (HCC)   Encephalopathy acute   ARDS (adult respiratory distress syndrome) (HCC)   Pressure injury of skin   Aspiration into airway   Status post tracheostomy ([place])   Fever   Septic shock (HCC)   Cellulitis of buttock   Palliative care encounter   Sacral wound  Depression/anxiety. She notes feeling down today. We discussed the option of starting an SSRI, however pt would like to defer pharmaceutical treatment at this time --continue seroquel 50mg  BID,  Continue klonopin 0.25mg  BID, slowly weaning --delirium precautions.  ESRD on iHD MWF. AVF clotted off resulting in need for use of tunneled HD line. On midodrine 10 mg TID --Management per nephrology.   Anemia of CKD/critical illness . Hgb stable 8.6->8.4. On aranesp, midodrine.  No sign of active infection. Could consider ferriheme at this time.  Chronic hypoxic respiratory failure s/p tracheostomy (6.0 shiley XLT cuffless) secondary to COVID 19 pneumonia complicated by MSSA pneumonia--unable to be weaned from vent. Oxygen saturations being maintained around 100% on 5L Plan:  --Wean supplemental O2 as able.  --pulm recommending trying to size down trach  --continue trach care. Management per trach team. PMV as able.  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 Plan -- Surgery notes from 11/23 indicate possible plans to place a wound vac. Surgery signing off -- Hydrotherapy per surgery recommendations.  -- continue frequent repositioning. wound care/dressing changes -- Pain management: oxycodone 10mg  q6h, prn fentanyl 115mcg with dressing changes -- PT/OT to work on mobility  Type 2 Diabetes. Two hypoglycemic episodes overnight--57, 68. AM glucose 98. Change levemir to 40U qHS.  novolog 7U every 4h and resistant SSI.  High risk malnutrition. Underwent FEEs with SLP yesterday--no aspiration noted. --Dysphagia 2 diet, thin liquids. Must use PMV when taking POs. --Supplement nutrition with Tube feeds via cortrak.   Hypothyroidism. Continue synthroid.  Goals of care. Full scope of care.  Best practice:  CODE STATUS: DNR Diet: Dysphagia 2, thin liquids. Supplement with tube feeds DVT for prophylaxis: heparin Social considerations/Family communication: daughter Dispo: pending LTACH bed availability--no beds available as of 11/23.  Mitzi Hansen, MD Internal Medicine Resident PGY-2 [facility] Pager: [phone] 04/25/2020 7:15 AM    After 5pm on weekdays and 1pm on weekends: On Call Pager: [phone]

## 2020-04-25 NOTE — Consult Note (Signed)
WOC Nurse Consult Note: Patient receiving care in [place] [facility].  Assisted with turning by NT, Amber. Reason for Consult: Initiation of complex NPWT to sacral/buttock wound Wound type: Necrotizing infection wound, previously undergone surgical debridement. Measurement: 18 cm x 18 cm x 7 cm with a tunnel extending toward the left lateral thigh that is 10.3 cm and has a hole that opens to the level of the skin.  The patient's right buttock area also has a tunnel/cavity that is at least 5.5 cm in length.  Both of these tunnels were filled with one wide strip of white foam each.  Then one large black foam was cut in half length wise and placed into the wound bed.  One flattened barrier ring was placed between the wound margin just above the anus, and drape was applied.  The entire NPWT dressing was bridged to the patient's left hip.  Immediate seal was obtained at 125 mm Hg pressure.  The patient tolerated well. Wound bed: Drainage (amount, consistency, odor) new cannister placed into VAC machine. Periwound: intact Dressing procedure/placement/frequency: Additional white foam ordered for Friday's dressing change.  Additional black foams and barrier rings in room.  [name], RN, MSN, CWOCN, CNS-BC, pager [phone]

## 2020-04-25 NOTE — Procedures (Signed)
Patient seen and examined on Hemodialysis. BP 98/65   Pulse (!) 110   Temp (!) 97.5 F (36.4 C) (Oral)   Resp 13   Ht 5\' 2"  (1.575 m)   Wt 104.2 kg   SpO2 94%   BMI 42.02 kg/m   QB 400 mL/ min via TDC, UF goal 2L  Tolerating treatment without complaints at this time.   Madelon Lips MD [facility] pgr [phone] 10:35 AM

## 2020-04-25 NOTE — Progress Notes (Signed)
RT NOTES: Came to assess patient d/t patient telling RN she was having a hard time breathing. Assessed inner cannula to find it was completely occluded with secretions. Changed inner cannula and suctioned patient for moderate thick secretions. Patient states she is now breathing "better". Sats 97%. Will continue to monitor.

## 2020-04-25 NOTE — Progress Notes (Signed)
[facility] Progress Note   Assessment/ Plan:      OP HD: TTS  4h 10mn 450/800 2/2.25 bath 111.5kg Hep 2000 L AVF - darbe 50 ug q week, last 9/7 - calc 1.0 tiw - 9/9 Hb 10.0, tsat 22%  49yo female w/ ESRD on TTS HD admitted for COVID PNA on 02/13/20. She was intubated. SP CRRT around 9/26- 10/7. Now is on intermittent HD MWF here. She is sp trach. She had MSSA pna. DM2.Then she had large sacral decub requiring I&D.   1. COVID pna /sp trach - off vent on trach collar 2. Hypotension - BP's stable on midodrine 10 tid 3. Stage IV sacral decub:- sp I&D 11/2 and again 11/5 by gen surg. Surgery following. SP course of IV abx completed 11/12, getting hydrotherapy daily.  4. ESRD - switching back to TTS.sp CRRT 9/26- 10/7.HD Mon/ Wed/ Sat per holiday sched 5. Volume - no edema on exam now, small UF w/ next HD 4. HD access: LUA AVF clotted here during acute covid. TDC x2 per IR 5. Anemia ckd/ critical illness - on darbe 150 weekly, 11/12 Ferritin >4000, iron sat 11.  With active infection and ^^ ferritin no IV iron. 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 Nutrition - getting NG Nepro tube feeds, rena-vite 8. S/P MSSA HCAP/ VAP 9. DM2 - per pmd 10. Dispo: would need LTACH   Subjective:    Seen on dialysis.  No needs.  More awake today.   Objective:   BP 98/65   Pulse (!) 110   Temp (!) 97.5 F (36.4 C) (Oral)   Resp 13   Ht '5\' 2"'  (1.575 m)   Wt 104.2 kg   SpO2 94%   BMI 42.02 kg/m   Physical Exam: Gen: lying in bed, on dialysis HEENT: trach and coretrak in place CVS: RRR Resp:coarse bilateral Abd: soft Ext: no LE edema SKIN: large sacral decub not examined today [facility] Recent Labs  Lab 04/20/20 0635 04/20/20 0838 04/21/20 0334 04/22/20 0144 04/23/20 0308 04/24/20 0341 04/25/20 0450  NA 137 136 138 136 135 139 138  K 4.3 4.1 3.7 4.3 4.3 3.6 3.7  CL 98 98 99 99 92* 99 98  CO2  '25 27 28 23 26 25 24  ' GLUCOSE 132* 147* 102* 149* 130* 112* 151*  BUN 65* 51* 39* 72* 108* 62* 93*  CREATININE 4.19* 3.43* 2.79* 4.07* 5.40* 3.63* 5.00*  CALCIUM 9.8 9.4 9.4 9.9 10.0 9.4 10.4*  PHOS 5.1* 4.2 3.7 5.5* 6.4* 4.0 5.2*   CBC Recent Labs  Lab 04/22/20 0144 04/23/20 0308 04/24/20 0341 04/25/20 0818  WBC 13.2* 12.7* 11.0* 11.7*  HGB 8.6* 8.4* 8.0* 7.9*  HCT 27.9* 26.4* 25.2* 24.7*  MCV 93.9 93.6 92.0 92.5  PLT 322 463* 335 399      Medications:    . sodium chloride   Intravenous Once  . B-complex with vitamin C  1 tablet Per Tube Daily  . chlorhexidine gluconate (MEDLINE KIT)  15 mL Mouth Rinse BID  . Chlorhexidine Gluconate Cloth  6 each Topical Daily  . clonazepam  0.25 mg Per Tube BID  . collagenase   Topical Daily  . darbepoetin (ARANESP) injection - DIALYSIS  150 mcg Intravenous Q Wed-HD  . feeding supplement (PROSource TF)  45 mL Per Tube TID  . free water  20 mL Per Tube Q6H  . heparin sodium (  porcine)      . hydrocerin   Topical Daily  . insulin aspart  0-20 Units Subcutaneous Q4H  . insulin aspart  7 Units Subcutaneous Q4H  . insulin detemir  40 Units Subcutaneous QHS  . levothyroxine  125 mcg Per Tube Q0600  . midodrine  10 mg Per Tube TID WC  . multivitamin  1 tablet Oral QHS  . nutrition supplement (JUVEN)  1 packet Per Tube BID  . oxyCODONE  10 mg Per Tube Q6H  . pantoprazole sodium  40 mg Per Tube Daily  . QUEtiapine  50 mg Per Tube BID  . sodium chloride flush  10-40 mL Intracatheter Q12H  . Thrombi-Pad  1 each Topical Once     Madelon Lips, MD 04/25/2020, 10:29 AM

## 2020-04-26 LAB — FERRITIN: Ferritin: 2777 ng/mL — ABNORMAL HIGH (ref 11–307)

## 2020-04-26 LAB — GLUCOSE, CAPILLARY
Glucose-Capillary: 44 mg/dL — CL (ref 70–99)
Glucose-Capillary: 111 mg/dL — ABNORMAL HIGH (ref 70–99)
Glucose-Capillary: 128 mg/dL — ABNORMAL HIGH (ref 70–99)
Glucose-Capillary: 136 mg/dL — ABNORMAL HIGH (ref 70–99)
Glucose-Capillary: 129 mg/dL — ABNORMAL HIGH (ref 70–99)
Glucose-Capillary: 92 mg/dL (ref 70–99)
Glucose-Capillary: 147 mg/dL — ABNORMAL HIGH (ref 70–99)
Glucose-Capillary: 125 mg/dL — ABNORMAL HIGH (ref 70–99)
Glucose-Capillary: 149 mg/dL — ABNORMAL HIGH (ref 70–99)

## 2020-04-26 LAB — RENAL FUNCTION PANEL
Albumin: 2 g/dL — ABNORMAL LOW (ref 3.5–5.0)
Anion gap: 13 (ref 5–15)
BUN: 57 mg/dL — ABNORMAL HIGH (ref 6–20)
CO2: 26 mmol/L (ref 22–32)
Calcium: 9.8 mg/dL (ref 8.9–10.3)
Chloride: 98 mmol/L (ref 98–111)
Creatinine, Ser: 3.7 mg/dL — ABNORMAL HIGH (ref 0.44–1.00)
GFR, Estimated: 14 mL/min — ABNORMAL LOW (ref >60)
Glucose, Bld: 163 mg/dL — ABNORMAL HIGH (ref 70–99)
Phosphorus: 3.6 mg/dL (ref 2.5–4.6)
Potassium: 3.6 mmol/L (ref 3.5–5.1)
Sodium: 137 mmol/L (ref 135–145)

## 2020-04-26 LAB — IRON AND TIBC
Iron: 28 ug/dL (ref 28–170)
Saturation Ratios: 18 % (ref 10.4–31.8)
TIBC: 155 ug/dL — ABNORMAL LOW (ref 250–450)
UIBC: 127 ug/dL

## 2020-04-26 MED ORDER — DEXTROSE 50 % IV SOLN
INTRAVENOUS | Status: AC
  Filled 2020-04-26: qty 50

## 2020-04-26 MED ORDER — DEXTROSE 50 % IV SOLN: 25.0000 g | INTRAVENOUS | Status: AC

## 2020-04-26 MED ORDER — INSULIN ASPART 100 UNIT/ML ~~LOC~~ SOLN
0.0000 [IU] | SUBCUTANEOUS | Status: DC
  Administered 2020-04-26 (×3): 2 [IU] via SUBCUTANEOUS
  Administered 2020-04-27 – 2020-04-28 (×2): 3 [IU] via SUBCUTANEOUS
  Administered 2020-04-28 (×2): 2 [IU] via SUBCUTANEOUS
  Administered 2020-04-29 – 2020-04-30 (×4): 3 [IU] via SUBCUTANEOUS

## 2020-04-26 NOTE — Progress Notes (Signed)
NAME:  Wanda Ramirez, MRN:  [id number], DOB:  September 22, 1970, LOS: 10 ADMISSION DATE:  02/13/2020   Interm history/ Subjective   Nephrology notes reviewed--no new changes Discussed plan for vascular access with Dr. Hollie Salk in the room today. She feels that Wanda Ramirez will need to continue to use the HD line at this time as the fistula would require thrombectomy and is likely no longer going to be usable. I asked about creating a new fistula however Dr. Hollie Salk notes that Wanda Ramirez would need to be able to use her arms more in order to mature a fistula.   Wound vac placed yesterday  Hypoglycemic x1 overnight  Daughter, [name], updated via telephone this morning.    Significant Hospital Events   9/14 Admission to general floor 9/23 Tunneled HD cath placed 9/26 Transferred to ICU for hypotension and CRRT 10/1 On CRRT and vasopressors 10/6 Extubated 10/18 tracheostomy performed 10/31 Restart vanc for staph in resp culture 11/2 Surgical debridement for worsening sacral wound 11/10 IMTS to assume care 11/11 febrile. merrem started. Surgery reconsulted 11/13 merrem stopped 11/14 hemodynamic significant sacral wound bleeding. Surgery placed stitch. 11/15 tx to ICU for hemorrhagic shock. Started on pressors. 11/17 transferred back to IMTS  Objective   Blood pressure 128/84, pulse (!) 101, temperature 98.7 F (37.1 C), temperature source Oral, resp. rate 12, height 5\' 2"  (1.575 m), weight 105 kg, SpO2 100 %.     Intake/Output Summary (Last 24 hours) at 04/26/2020 0531 Last data filed at 04/25/2020 1126 Gross per 24 hour  Intake --  Output 1000 ml  Net -1000 ml   Filed Weights   04/23/20 0718 04/23/20 1054 04/26/20 0417  Weight: 106.2 kg 104.2 kg 105 kg    Examination: General: chronically ill appearing Neck: tracheostomy present  Cardiac: RRR, no LE edema Pulm: lungs clear  Consults:  Nephrology Palliative care Pulm  Significant Diagnostic Tests:  9/13 CXR>> bilateral patchy  airspace opacities 9/21 VQ scan>> no PE 9/29 echocardiogram>> normal LVEF, findings consistent with increased RV volume and fluid overload, moderately reduced RV function 10/28 CT abdomen/pelvis >>scattered ground glass and consolidative opacities in the lung bases. Cystic space in the anterior, subpleural RML with septations favoring complicated pneumatocele. Non-obstructive punctuate nephrolithiasis 11/1 CT abdomen/pelvis>> persistent multifocal bibasilar airspace disease with scarring. Bullous changes seen in RML. Decubitus ulcer overlying the sacrococcygeal junction with extensive gas seen in the left gluteal region involving the muscle and fat. No evidence of abscess. Findings consistent with cellulitis and myositis. No evidence of body destruction to suggest osteomyelitis.  Micro Data:  9/13, 9/26, 10/8, 10/24, 11/2, 11/9 blood cultures>>no growth 10/29 resp culture>> MSSA, resistant to clinda, erythromycin, clinda 11/2 wound culture>> e.coli resistant to ampicillin, cephalosporins, bactrim, e. Faecalis pan sensitive  Antimicrobials:  Remdesivir 9/14>>9/19 Cefepime 9/20>>9/29 Azithromycin 9/20>>9/25 Zosyn 10/8, 11/1>11/8 Vanc 10/8, 10/31>>11/5 Rocephin>>10/10>>10/16 Merrem 11/11>>11/13  Procedures/lines  Tracheostomy shiley 6.0 XLT 10/18 Sacral wound debridement--11/2  Tunneled HD line 9/27>> Central line 11/2>> Rectal tube 10/2>> Cortrak 10/4>> Wound vac 11/24>>  Central line 9/26-10/24 A-line 9/30>>10/13, 11/2-11/9 HD line 9/22-9/27 ETT 9/26-10/6 ETT 10/8-10/18  Labs    CBC Latest Ref Rng & Units 04/25/2020 04/24/2020 04/23/2020  WBC 4.0 - 10.5 K/uL 11.7(H) 11.0(H) 12.7(H)  Hemoglobin 12.0 - 15.0 g/dL 7.9(L) 8.0(L) 8.4(L)  Hematocrit 36 - 46 % 24.7(L) 25.2(L) 26.4(L)  Platelets 150 - 400 K/uL 399 335 463(H)   BMP Latest Ref Rng & Units 04/25/2020 04/24/2020 04/23/2020  Glucose 70 - 99 mg/dL 151(H) 112(H) 130(H)  BUN 6 - 20 mg/dL 93(H) 62(H) 108(H)  Creatinine  0.44 - 1.00 mg/dL 5.00(H) 3.63(H) 5.40(H)  Sodium 135 - 145 mmol/L 138 139 135  Potassium 3.5 - 5.1 mmol/L 3.7 3.6 4.3  Chloride 98 - 111 mmol/L 98 99 92(L)  CO2 22 - 32 mmol/L 24 25 26   Calcium 8.9 - 10.3 mg/dL 10.4(H) 9.4 10.0    Summary  73 yof admitted 02/17/20 for COVID-19 pneumonia who developed chronic respiratory failure requiring a tracheostomy after an extended stay in ICU. Her hospitalization has been complicated by MSSA pneumonia as well as the development of a necrotizing soft tissue infection involving her gluteus secondary to MDR organisms. She was transferred to the IMTS 11/10.   She is medically stable for discharge to [facility] pending bed availability.  Assessment & Plan:  Principal Problem:   COVID-19 Active Problems:   End stage renal disease on dialysis ([place])   Diabetes mellitus type 2 in obese (HCC)   OSA (obstructive sleep apnea)   HCAP (healthcare-associated pneumonia)   Acute respiratory failure with hypoxemia (HCC)   Encephalopathy acute   ARDS (adult respiratory distress syndrome) (HCC)   Pressure injury of skin   Aspiration into airway   Status post tracheostomy ([place])   Fever   Septic shock (HCC)   Cellulitis of buttock   Palliative care encounter   Sacral wound  Depression/anxiety. She notes feeling down today. We discussed the option of starting an SSRI, however pt would like to defer pharmaceutical treatment at this time --continue seroquel 50mg  BID,  Continue klonopin 0.25mg  BID, slowly weaning --delirium precautions.  ESRD on iHD MWF. AVF clotted off resulting in need for use of tunneled HD line. On midodrine 10 mg TID --Management per nephrology.   Anemia of CKD. Hgb stable 8.6->8.4. On aranesp, midodrine.  -f/u iron panel -nephrology to direct need for ferraheme  Chronic hypoxic respiratory failure s/p tracheostomy (6.0 shiley XLT cuffless) secondary to COVID 19 pneumonia complicated by MSSA pneumonia--unable to be weaned from vent. Oxygen  saturations being maintained around 100% on 5L --Wean supplemental O2 as able.  --pulm recommending trying to size down trach  --continue trach care. Management per trach team. PMV as able.  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 Wound vac placed 11/24 Plan -- continue wound vac -- continue frequent repositioning -- Pain management: oxycodone 10mg  q6h, prn fentanyl 138mcg with dressing changes  Type 2 Diabetes.  Change levemir to 40U qHS.  novolog 7U every 4h. Decreased SSI to moderate.  High risk malnutrition. Underwent FEEs with SLP yesterday--no aspiration noted. --Dysphagia 2 diet, thin liquids. Must use PMV when taking POs. --Supplement nutrition with Tube feeds via cortrak.   Hypothyroidism. Continue synthroid.  Best practice:  CODE STATUS: DNR Diet: Dysphagia 2, thin liquids. Supplement with tube feeds DVT for prophylaxis: heparin Social considerations/Family communication: daughter updated 11/25 Dispo: pending LTACH bed availability--no beds available as of 11/23.  Mitzi Hansen, MD Internal Medicine Resident PGY-2 [facility] Pager: [phone] 04/26/2020 5:31 AM    After 5pm on weekdays and 1pm on weekends: On Call Pager: [phone]

## 2020-04-26 NOTE — Progress Notes (Signed)
°  Date: 04/26/2020  Patient name: Wanda Ramirez  Medical record number: [id number]  Date of birth: [birth year]   This patient's plan of care was discussed with the house staff. Please see Dr. Chase Picket note for complete details. I concur with her findings.  Complicated patient case.  She will need to be discharged to [facility] going forward.    Sid Falcon, MD 04/26/2020, 12:59 PM

## 2020-04-26 NOTE — Progress Notes (Signed)
Patient was picking on her wound vac and the machine was beeping due to  leakage. Reinforcement of the dressing done and the machine is working efficiently now. Will continue to monitor.

## 2020-04-26 NOTE — Progress Notes (Signed)
°   04/26/20 1600  Assess: MEWS Score  BP (!) 142/87  Pulse Rate (!) 114  ECG Heart Rate (!) 115  Resp 14  SpO2 100 %  Assess: MEWS Score  MEWS Temp 0  MEWS Systolic 0  MEWS Pulse 2  MEWS RR 0  MEWS LOC 0  MEWS Score 2  MEWS Score Color Yellow  Assess: if the MEWS score is Yellow or Red  Were vital signs taken at a resting state? Yes  Focused Assessment No change from prior assessment  Early Detection of Sepsis Score *See Row Information* Low  MEWS guidelines implemented *See Row Information* Yes  Treat  MEWS Interventions Administered scheduled meds/treatments  Pain Scale 0-10  Pain Score 2  Faces Pain Scale 2  Pain Type Chronic pain  Pain Location Buttocks  Take Vital Signs  Increase Vital Sign Frequency  Yellow: Q 2hr X 2 then Q 4hr X 2, if remains yellow, continue Q 4hrs  Escalate  MEWS: Escalate Yellow: discuss with charge nurse/RN and consider discussing with provider and RRT  Notify: Charge Nurse/RN  Name of Charge Nurse/RN Notified Claiborne Billings  Date Charge Nurse/RN Notified 04/26/20  Time Charge Nurse/RN Notified 1607  Document  Patient Outcome Stabilized after interventions

## 2020-04-26 NOTE — Progress Notes (Signed)
[facility] Progress Note   Assessment/ Plan:      OP HD: TTS  4h 77mn 450/800 2/2.25 bath 111.5kg Hep 2000 L AVF - darbe 50 ug q week, last 9/7 - calc 1.0 tiw - 9/9 Hb 10.0, tsat 22%  49yo female w/ ESRD on TTS HD admitted for COVID PNA on 02/13/20. She was intubated. SP CRRT around 9/26- 10/7. Now is on intermittent HD MWF here. She is sp trach. She had MSSA pna. DM2.Then she had large sacral decub requiring I&D.   1. COVID pna /sp trach - off vent on trach collar 2. Hypotension - BP's stable on midodrine 10 tid 3. Stage IV sacral decub:- sp I&D 11/2 and again 11/5 by gen surg. Surgery following. SP course of IV abx completed 11/12, getting hydrotherapy daily.  4. ESRD - switching back to TTS.sp CRRT 9/26- 10/7.HD Mon/ Wed/ Sat per holiday sched 5. Volume - no edema on exam now, small UF w/ next HD 4. HD access: LUA AVF clotted here during acute covid. TDC x2 per IR 5. Anemia ckd/ critical illness - on darbe 150 weekly, 11/12 Ferritin >4000, iron sat 11.  With active infection and ^^ ferritin no IV iron. tx prbc's PRN.  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 Nutrition - getting NG Nepro tube feeds, rena-vite 8. S/P MSSA HCAP/ VAP 9. DM2 - per pmd 10. Dispo: would need LTACH   Subjective:    No acute events overnight.     Objective:   BP 130/76 (BP Location: Left Leg)   Pulse (!) 106   Temp 98.4 F (36.9 C) (Oral)   Resp 12   Ht '5\' 2"'  (1.575 m)   Wt 105 kg   SpO2 100%   BMI 42.34 kg/m   Physical Exam: Gen: lying in bed, on dialysis HEENT: trach and coretrak in place CVS: RRR Resp:coarse bilateral Abd: soft Ext: no LE edema SKIN: large sacral decub not examined today [facility]: BMET Recent Labs  Lab 04/20/20 0838 04/21/20 0334 04/22/20 0144 04/23/20 0308 04/24/20 0341 04/25/20 0450 04/26/20 0845  NA 136 138 136 135 139 138 137  K 4.1 3.7 4.3 4.3 3.6 3.7 3.6  CL 98  99 99 92* 99 98 98  CO2 '27 28 23 26 25 24 26  ' GLUCOSE 147* 102* 149* 130* 112* 151* 163*  BUN 51* 39* 72* 108* 62* 93* 57*  CREATININE 3.43* 2.79* 4.07* 5.40* 3.63* 5.00* 3.70*  CALCIUM 9.4 9.4 9.9 10.0 9.4 10.4* 9.8  PHOS 4.2 3.7 5.5* 6.4* 4.0 5.2* 3.6   CBC Recent Labs  Lab 04/22/20 0144 04/23/20 0308 04/24/20 0341 04/25/20 0818  WBC 13.2* 12.7* 11.0* 11.7*  HGB 8.6* 8.4* 8.0* 7.9*  HCT 27.9* 26.4* 25.2* 24.7*  MCV 93.9 93.6 92.0 92.5  PLT 322 463* 335 399      Medications:    . sodium chloride   Intravenous Once  . B-complex with vitamin C  1 tablet Per Tube Daily  . chlorhexidine gluconate (MEDLINE KIT)  15 mL Mouth Rinse BID  . Chlorhexidine Gluconate Cloth  6 each Topical Daily  . clonazepam  0.25 mg Per Tube BID  . collagenase   Topical Daily  . darbepoetin (ARANESP) injection - DIALYSIS  150 mcg Intravenous Q Wed-HD  . feeding supplement (PROSource TF)  45 mL Per Tube TID  . free water  20 mL Per Tube Q6H  .  hydrocerin   Topical Daily  . insulin aspart  0-15 Units Subcutaneous Q4H  . insulin aspart  7 Units Subcutaneous Q4H  . insulin glargine  40 Units Subcutaneous Daily  . levothyroxine  125 mcg Per Tube Q0600  . midodrine  10 mg Per Tube TID WC  . multivitamin  1 tablet Per Tube QHS  . nutrition supplement (JUVEN)  1 packet Per Tube BID  . oxyCODONE  10 mg Per Tube Q6H  . pantoprazole sodium  40 mg Per Tube Daily  . QUEtiapine  50 mg Per Tube BID  . sodium chloride flush  10-40 mL Intracatheter Q12H  . Thrombi-Pad  1 each Topical Once     Madelon Lips, MD 04/26/2020, 10:30 AM

## 2020-04-26 NOTE — Progress Notes (Signed)
Hypoglycemic Event  CBG: 44 mg/dL  Treatment: D50 50 mL (25 gm)  Symptoms: None  Follow-up CBG: Time:0412 CBG Result:111  Possible Reasons for Event: Medication regimen: Lantus  Comments/MD notified:Kamat ( talked to the RN on call and she indicated she will let him know.    Tad Moore

## 2020-04-26 NOTE — Progress Notes (Addendum)
Order to discontinue Central line. Currently still in place due to inability to establish PIV access. Tonight was able to place a PIV in R hand. Corliss Parish RN notified that Central line could be discontinued per order, as PIV access had been established.

## 2020-04-27 LAB — RENAL FUNCTION PANEL
Albumin: 2.1 g/dL — ABNORMAL LOW (ref 3.5–5.0)
Anion gap: 16 — ABNORMAL HIGH (ref 5–15)
BUN: 73 mg/dL — ABNORMAL HIGH (ref 6–20)
CO2: 24 mmol/L (ref 22–32)
Calcium: 10.7 mg/dL — ABNORMAL HIGH (ref 8.9–10.3)
Chloride: 97 mmol/L — ABNORMAL LOW (ref 98–111)
Creatinine, Ser: 4.77 mg/dL — ABNORMAL HIGH (ref 0.44–1.00)
GFR, Estimated: 11 mL/min — ABNORMAL LOW (ref >60)
Glucose, Bld: 138 mg/dL — ABNORMAL HIGH (ref 70–99)
Phosphorus: 4.2 mg/dL (ref 2.5–4.6)
Potassium: 3.9 mmol/L (ref 3.5–5.1)
Sodium: 137 mmol/L (ref 135–145)

## 2020-04-27 LAB — CBC
HCT: 28.8 % — ABNORMAL LOW (ref 36.0–46.0)
Hemoglobin: 9.1 g/dL — ABNORMAL LOW (ref 12.0–15.0)
MCH: 28.9 pg (ref 26.0–34.0)
MCHC: 31.6 g/dL (ref 30.0–36.0)
MCV: 91.4 fL (ref 80.0–100.0)
Platelets: 416 10*3/uL — ABNORMAL HIGH (ref 150–400)
RBC: 3.15 MIL/uL — ABNORMAL LOW (ref 3.87–5.11)
RDW: 13.9 % (ref 11.5–15.5)
WBC: 12.6 10*3/uL — ABNORMAL HIGH (ref 4.0–10.5)
nRBC: 0 % (ref 0.0–0.2)

## 2020-04-27 LAB — GLUCOSE, CAPILLARY
Glucose-Capillary: 100 mg/dL — ABNORMAL HIGH (ref 70–99)
Glucose-Capillary: 113 mg/dL — ABNORMAL HIGH (ref 70–99)
Glucose-Capillary: 115 mg/dL — ABNORMAL HIGH (ref 70–99)
Glucose-Capillary: 125 mg/dL — ABNORMAL HIGH (ref 70–99)
Glucose-Capillary: 154 mg/dL — ABNORMAL HIGH (ref 70–99)
Glucose-Capillary: 87 mg/dL (ref 70–99)

## 2020-04-27 MED ORDER — CLONAZEPAM 0.25 MG PO TBDP
0.2500 mg | ORAL_TABLET | Freq: Every day | ORAL | Status: DC
  Administered 2020-04-28 – 2020-05-09 (×12): 0.25 mg
  Filled 2020-04-27 (×12): qty 1

## 2020-04-27 MED ORDER — NYSTATIN 100000 UNIT/ML MT SUSP
5.0000 mL | Freq: Four times a day (QID) | OROMUCOSAL | Status: DC
  Administered 2020-04-27 – 2020-05-01 (×16): 500000 [IU] via ORAL
  Filled 2020-04-27 (×15): qty 5

## 2020-04-27 NOTE — Progress Notes (Signed)
NAME:  Wanda Ramirez, MRN:  [id number], DOB:  1970/09/27, LOS: 92 ADMISSION DATE:  02/13/2020   Interm history/ Subjective   No significant overnight events No hypoglycemic events overnight  Significant Hospital Events   9/14 Admission to general floor 9/23 Tunneled HD cath placed 9/26 Transferred to ICU for hypotension and CRRT 10/1 On CRRT and vasopressors 10/6 Extubated 10/18 tracheostomy performed 10/31 Restart vanc for staph in resp culture 11/2 Surgical debridement for worsening sacral wound 11/10 IMTS to assume care 11/11 febrile. merrem started. Surgery reconsulted 11/13 merrem stopped 11/14 hemodynamic significant sacral wound bleeding. Surgery placed stitch. 11/15 tx to ICU for hemorrhagic shock. Started on pressors. 11/17 transferred back to IMTS  Objective   Blood pressure 120/62, pulse (!) 114, temperature 97.8 F (36.6 C), temperature source Oral, resp. rate 16, height 5\' 2"  (1.575 m), weight 105 kg, SpO2 98 %.     Intake/Output Summary (Last 24 hours) at 04/27/2020 0505 Last data filed at 04/27/2020 0400 Gross per 24 hour  Intake 1900 ml  Output --  Net 1900 ml   Filed Weights   04/23/20 0718 04/23/20 1054 04/26/20 0417  Weight: 106.2 kg 104.2 kg 105 kg    Examination: General: chronically ill appearing Neck: tracheostomy present  Cardiac: RRR, no LE edema Pulm: lungs clear  Consults:  Nephrology Palliative care Pulm  Significant Diagnostic Tests:  9/13 CXR>> bilateral patchy airspace opacities 9/21 VQ scan>> no PE 9/29 echocardiogram>> normal LVEF, findings consistent with increased RV volume and fluid overload, moderately reduced RV function 10/28 CT abdomen/pelvis >>scattered ground glass and consolidative opacities in the lung bases. Cystic space in the anterior, subpleural RML with septations favoring complicated pneumatocele. Non-obstructive punctuate nephrolithiasis 11/1 CT abdomen/pelvis>> persistent multifocal bibasilar airspace  disease with scarring. Bullous changes seen in RML. Decubitus ulcer overlying the sacrococcygeal junction with extensive gas seen in the left gluteal region involving the muscle and fat. No evidence of abscess. Findings consistent with cellulitis and myositis. No evidence of body destruction to suggest osteomyelitis.  Micro Data:  9/13, 9/26, 10/8, 10/24, 11/2, 11/9 blood cultures>>no growth 10/29 resp culture>> MSSA, resistant to clinda, erythromycin, clinda 11/2 wound culture>> e.coli resistant to ampicillin, cephalosporins, bactrim, e. Faecalis pan sensitive  Antimicrobials:  Remdesivir 9/14>>9/19 Cefepime 9/20>>9/29 Azithromycin 9/20>>9/25 Zosyn 10/8, 11/1>11/8 Vanc 10/8, 10/31>>11/5 Rocephin>>10/10>>10/16 Merrem 11/11>>11/13  Procedures/lines  Tracheostomy shiley 6.0 XLT 10/18 Sacral wound debridement--11/2  Tunneled HD line 9/27>> Central line 11/2>> Rectal tube 10/2>> Cortrak 10/4>> Wound vac 11/24>>  Central line 9/26-10/24 A-line 9/30>>10/13, 11/2-11/9 HD line 9/22-9/27 ETT 9/26-10/6 ETT 10/8-10/18  Labs    CBC Latest Ref Rng & Units 04/27/2020 04/25/2020 04/24/2020  WBC 4.0 - 10.5 K/uL 12.6(H) 11.7(H) 11.0(H)  Hemoglobin 12.0 - 15.0 g/dL 9.1(L) 7.9(L) 8.0(L)  Hematocrit 36 - 46 % 28.8(L) 24.7(L) 25.2(L)  Platelets 150 - 400 K/uL 416(H) 399 335   BMP Latest Ref Rng & Units 04/27/2020 04/26/2020 04/25/2020  Glucose 70 - 99 mg/dL 138(H) 163(H) 151(H)  BUN 6 - 20 mg/dL 73(H) 57(H) 93(H)  Creatinine 0.44 - 1.00 mg/dL 4.77(H) 3.70(H) 5.00(H)  Sodium 135 - 145 mmol/L 137 137 138  Potassium 3.5 - 5.1 mmol/L 3.9 3.6 3.7  Chloride 98 - 111 mmol/L 97(L) 98 98  CO2 22 - 32 mmol/L 24 26 24   Calcium 8.9 - 10.3 mg/dL 10.7(H) 9.8 10.4(H)    Summary  58 yof admitted 02/17/20 for COVID-19 pneumonia who developed chronic respiratory failure requiring a tracheostomy after an extended stay in ICU.  Her hospitalization has been complicated by MSSA pneumonia as well as the  development of a necrotizing soft tissue infection involving her gluteus secondary to MDR organisms. She was transferred to the IMTS 11/10.   She is medically stable for discharge to [facility] pending bed availability.  Assessment & Plan:  Principal Problem:   COVID-19 Active Problems:   End stage renal disease on dialysis ([place])   Diabetes mellitus type 2 in obese (HCC)   OSA (obstructive sleep apnea)   HCAP (healthcare-associated pneumonia)   Acute respiratory failure with hypoxemia (HCC)   Encephalopathy acute   ARDS (adult respiratory distress syndrome) (HCC)   Pressure injury of skin   Aspiration into airway   Status post tracheostomy ([place])   Fever   Septic shock (HCC)   Cellulitis of buttock   Palliative care encounter   Sacral wound  Depression/anxiety.  --continue seroquel 50mg  BID. Decrease klonapin to daily. --delirium precautions.  ESRD on iHD MWF. AVF clotted off resulting in need for use of tunneled HD line. On midodrine 10 mg TID Anemia of CKD. --Management per nephrology.  --Aranesp and midodrine per nephrology  Chronic hypoxic respiratory failure s/p tracheostomy (6.0 shiley XLT cuffless) secondary to COVID 19 pneumonia complicated by MSSA pneumonia--unable to be weaned from vent. Oxygen saturations being maintained around 100% on 5L --Wean supplemental O2 as able.  --pulm recommending trying to size down trach  --continue trach care. Management per trach team. PMV as able.  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 Wound vac placed 11/24 Plan -- continue wound vac. Frequent repositioning. -- Pain management: oxycodone 10mg  q6h, prn fentanyl 177mcg with dressing changes  Type 2 Diabetes.  Levemir to 40U qHS.  novolog 7U every 4h. Moderate SSI  High risk malnutrition.  --Dysphagia 2 diet, thin liquids. Must use PMV when taking POs. --Supplement nutrition with Tube feeds via cortrak.   Hypothyroidism.  Continue synthroid.  Best practice:  CODE STATUS: DNR Diet: Dysphagia 2, thin liquids. Supplement with tube feeds DVT for prophylaxis: heparin Social considerations/Family communication:  Dispo: pending LTACH bed availability--no beds available as of 11/23.  Mitzi Hansen, MD Internal Medicine Resident PGY-2 [facility] Pager: [phone] 04/27/2020 5:05 AM    After 5pm on weekdays and 1pm on weekends: On Call Pager: [phone]

## 2020-04-27 NOTE — Consult Note (Signed)
[place] Nurse wound follow up Patient receiving care in  [facility] [id number]. Wound type: surgical buttocks wound Measurement: na Wound bed: red Drainage (amount, consistency, odor) heavy amount (316ml in cannister placed 2 days ago) of thick, tan, purulent looking drainage. Wound bed bleeding slightly when existing foam removed Periwound: intact Dressing procedure/placement/frequency: All pieces of white and black foam removed from tunnels and wound bed. One piece of white foam placed into tunnel extending to left thigh area and into right buttock area. Then two large pieces of black foam placed into wound bed. Dressing bridged to left hip.  Drape applied, immediate seal obtained.  Patient tolerated well and was able to help pull herself over sufficiently so that I could perform the therapy dressing change without assistance. Additional supplies requested.  I had requested 2 white foams 2 days ago, but they could not be located.   [name], RN, MSN, CWOCN, CNS-BC, pager [phone]

## 2020-04-27 NOTE — Progress Notes (Signed)
[facility] Progress Note   Assessment/ Plan:      OP HD: TTS  4h 33mn 450/800 2/2.25 bath 111.5kg Hep 2000 L AVF - darbe 50 ug q week, last 9/7 - calc 1.0 tiw - 9/9 Hb 10.0, tsat 22%  49yo female w/ ESRD on TTS HD admitted for COVID PNA on 02/13/20. She was intubated. SP CRRT around 9/26- 10/7. Now is on intermittent HD MWF here. She is sp trach. She had MSSA pna. DM2.Then she had large sacral decub requiring I&D.   1. COVID pna /sp trach - off vent on trach collar 2. Hypotension - BP's stable on midodrine 10 tid 3. Stage IV sacral decub:- sp I&D 11/2 and again 11/5 by gen surg. Surgery following. SP course of IV abx completed 11/12, getting hydrotherapy daily.  4. ESRD - switching back to TTS.sp CRRT 9/26- 10/7.HD Mon/ Wed/ Sat per holiday sched 5. Volume - no edema on exam now, small UF w/ next HD 4. HD access: LUA AVF clotted here during acute covid. TDC x2 per IR 5. Anemia ckd/ critical illness - on darbe 150 weekly, 11/12 Ferritin >4000, iron sat 11.  With active infection and ^^ ferritin no IV iron. tx prbc's PRN.  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 Nutrition - getting NG Nepro tube feeds.  FEES with SLP 11/23.  Dysphagia 2, thin liquids, wearing PMV with it.   8. S/P MSSA HCAP/ VAP 9. DM2 - per pmd 10. Thrush: Nystatin QID 11. Dispo: would need LTACH- [facility]   Subjective:    Reports mouth pain.  + thrush.  For HD tomorrow.       Objective:   BP 120/62 (BP Location: Left Leg)   Pulse (!) 111   Temp 97.8 F (36.6 C) (Oral)   Resp 18   Ht _0  (1.575 m)   Wt 105 kg   SpO2 98%   BMI 42.34 kg/m   Physical Exam: Gen: lying in bed, on dialysis HEENT: trach and coretrak in place CVS: RRR Resp:coarse bilateral Abd: soft Ext: no LE edema SKIN: large sacral decub not examined today [facility]: [facility] Recent Labs   Lab 04/21/20 0334 04/22/20 0144 04/23/20 0308 04/24/20 0341 04/25/20 0450 04/26/20 0845 04/27/20 0151  NA 138 136 135 139 138 137 137  K 3.7 4.3 4.3 3.6 3.7 3.6 3.9  CL 99 99 92* 99 98 98 97*  CO2 _1 GLUCOSE 102* 149* 130* 112* 151* 163* 138*  BUN 39* 72* 108* 62* 93* 57* 73*  CREATININE 2.79* 4.07* 5.40* 3.63* 5.00* 3.70* 4.77*  CALCIUM 9.4 9.9 10.0 9.4 10.4* 9.8 10.7*  PHOS 3.7 5.5* 6.4* 4.0 5.2* 3.6 4.2   CBC Recent Labs  Lab 04/23/20 0308 04/24/20 0341 04/25/20 0818 04/27/20 0151  WBC 12.7* 11.0* 11.7* 12.6*  HGB 8.4* 8.0* 7.9* 9.1*  HCT 26.4* 25.2* 24.7* 28.8*  MCV 93.6 92.0 92.5 91.4  PLT 463* 335 399 416*      Medications:    . sodium chloride   Intravenous Once  . B-complex with vitamin C  1 tablet Per Tube Daily  . chlorhexidine gluconate (MEDLINE KIT)  15 mL Mouth Rinse BID  . Chlorhexidine Gluconate Cloth  6 each Topical Daily  . clonazepam  0.25 mg Per Tube BID  .  collagenase   Topical Daily  . darbepoetin (ARANESP) injection - DIALYSIS  150 mcg Intravenous Q Wed-HD  . feeding supplement (PROSource TF)  45 mL Per Tube TID  . free water  20 mL Per Tube Q6H  . hydrocerin   Topical Daily  . insulin aspart  0-15 Units Subcutaneous Q4H  . insulin aspart  7 Units Subcutaneous Q4H  . insulin glargine  40 Units Subcutaneous Daily  . levothyroxine  125 mcg Per Tube Q0600  . midodrine  10 mg Per Tube TID WC  . multivitamin  1 tablet Per Tube QHS  . nutrition supplement (JUVEN)  1 packet Per Tube BID  . oxyCODONE  10 mg Per Tube Q6H  . pantoprazole sodium  40 mg Per Tube Daily  . QUEtiapine  50 mg Per Tube BID  . sodium chloride flush  10-40 mL Intracatheter Q12H  . Thrombi-Pad  1 each Topical Once     Madelon Lips, MD 04/27/2020, 10:22 AM

## 2020-04-27 NOTE — Progress Notes (Signed)
Physical Therapy Treatment Patient Details Name: OPAL DINNING MRN: [id number] DOB: 08-24-1970 Today's Date: 04/27/2020    History of Present Illness 49 yo admitted 9/13 with Covid PNA, intubated 9/24-10/6, reintubated 10/8, CRRT 9/26-10/7, trach and bronch 10/18. Pt returned to vent 10/30-11/7. Pt with significant sacral wound to bone s/p I&D x 2. Pt with hemorrhage from wound 11/14, hemorrhagic shock 11/15. PMhx: ESRD TTS, obesity, HTN, GERD, hypothyroidism, DM, asthma    PT Comments    Pt pleasant, appropriate and following commands. Pt tilt bed function not working and [employer] called. Pt able to transition to sitting EOB, maintain balance and perform seated HEp on 28% FiO2 trach collar at 98% SpO2. Encouraged mobility and pressure relief as well as continued HEP. Will resume tilt trials once bed is fixed.    Follow Up Recommendations  LTACH;Supervision/Assistance - 24 hour     Equipment Recommendations  Wheelchair (measurements PT);Wheelchair cushion (measurements PT);Hospital bed;Other (comment)    Recommendations for Other Services       Precautions / Restrictions Precautions Precautions: Fall;Other (comment) Precaution Comments: trach, cortrak, flexiseal, sacral wound with VAC    Mobility  Bed Mobility Overal bed mobility: Needs Assistance Bed Mobility: Rolling Rolling: Min assist   Supine to sit: Mod assist Sit to supine: Mod assist   General bed mobility comments: min assist with pad to roll bil for pad placement and sliding toward HOB. Physical assist to clear legs and bring them back to surface with transition from supine<>sit. Pt sat EOB 10 min  Transfers                 General transfer comment: unable to tilt as function was not working  Ambulation/Gait                 Marine scientist [place] (Stroke Patients Only)       Balance Overall balance assessment: Needs assistance    Sitting balance-Leahy Scale: Fair Sitting balance - Comments: EOB 10 min with guarding for safety without UE support                                    Cognition Arousal/Alertness: Awake/alert Behavior During Therapy: Flat affect Overall Cognitive Status: Difficult to assess Area of Impairment: Attention;Following commands;Safety/judgement;Awareness;Problem solving                   Current Attention Level: Selective Memory: Decreased short-term memory Following Commands: Follows one step commands with increased time;Follows multi-step commands consistently Safety/Judgement: Decreased awareness of safety;Decreased awareness of deficits   Problem Solving: Requires verbal cues;Requires tactile cues;Slow processing General Comments: pt following commands with increased time and able to respond verbally with trach occluded at times and at times able to phonate without occlusion      Exercises General Exercises - Lower Extremity Long Arc Quad: AROM;Both;Seated;20 reps Hip Flexion/Marching: Both;Seated;15 reps;AROM    General Comments        Pertinent Vitals/Pain Pain Assessment: No/denies pain    Home Living                      Prior Function            PT Goals (current goals can now be found in the care plan section) Acute Rehab PT Goals Time For Goal Achievement:  05/11/20 Potential to Achieve Goals: Fair Progress towards PT goals: Progressing toward goals    Frequency    Min 3X/week      PT Plan Current plan remains appropriate    Co-evaluation              AM-PAC PT "6 Clicks" Mobility   Outcome Measure  Help needed turning from your back to your side while in a flat bed without using bedrails?: A Little Help needed moving from lying on your back to sitting on the side of a flat bed without using bedrails?: A Lot Help needed moving to and from a bed to a chair (including a wheelchair)?: Total Help needed standing up  from a chair using your arms (e.g., wheelchair or bedside chair)?: Total Help needed to walk in hospital room?: Total Help needed climbing 3-5 steps with a railing? : Total 6 Click Score: 9    End of Session Equipment Utilized During Treatment: Oxygen Activity Tolerance: Patient tolerated treatment well Patient left: in bed;with call bell/phone within reach;with nursing/sitter in room Nurse Communication: Mobility status;Precautions;Need for lift equipment PT Visit Diagnosis: Muscle weakness (generalized) (M62.81);Difficulty in walking, not elsewhere classified (R26.2);Pain     Time: 5110-2111 PT Time Calculation (min) (ACUTE ONLY): 31 min  Charges:  $Therapeutic Exercise: 8-22 mins $Therapeutic Activity: 8-22 mins                     York Valliant P, PT Acute Rehabilitation Services Pager: [phone] Office: [place] 04/27/2020, 1:20 PM

## 2020-04-27 NOTE — Progress Notes (Signed)
Cortrak Tube Team Note:  Consult received for clogged Cortrak feeding tube.   RD able to unclog tube at bedside. Tube flushes without difficulty. Tube remains secured at 69cm.   Koleen Distance MS, RD, LDN Please refer to [facility] for RD and/or RD on-call/weekend/after hours pager

## 2020-04-27 NOTE — Progress Notes (Signed)
SLP Cancellation Note  Patient Details Name: Wanda Ramirez MRN: [id number] DOB: [birth year]   Cancelled treatment:       Reason Eval/Treat Not Completed: Patient declined, no reason specified. Pt reported that she was tired and wanted to rest and declined p.o. intake due to c/o odynophagia from "yeast". SLP will follow up on a subsequent date per pt's request.    Tobie Poet I. Hardin Negus, [place], [place] Office number [phone] Pager [place] 04/27/2020, 5:33 PM

## 2020-04-28 LAB — GLUCOSE, CAPILLARY
Glucose-Capillary: 62 mg/dL — ABNORMAL LOW (ref 70–99)
Glucose-Capillary: 79 mg/dL (ref 70–99)
Glucose-Capillary: 144 mg/dL — ABNORMAL HIGH (ref 70–99)
Glucose-Capillary: 167 mg/dL — ABNORMAL HIGH (ref 70–99)
Glucose-Capillary: 140 mg/dL — ABNORMAL HIGH (ref 70–99)
Glucose-Capillary: 111 mg/dL — ABNORMAL HIGH (ref 70–99)
Glucose-Capillary: 117 mg/dL — ABNORMAL HIGH (ref 70–99)
Glucose-Capillary: 126 mg/dL — ABNORMAL HIGH (ref 70–99)

## 2020-04-28 LAB — RENAL FUNCTION PANEL
Albumin: 1.8 g/dL — ABNORMAL LOW (ref 3.5–5.0)
Anion gap: 13 (ref 5–15)
BUN: 102 mg/dL — ABNORMAL HIGH (ref 6–20)
CO2: 25 mmol/L (ref 22–32)
Calcium: 10 mg/dL (ref 8.9–10.3)
Chloride: 95 mmol/L — ABNORMAL LOW (ref 98–111)
Creatinine, Ser: 6.01 mg/dL — ABNORMAL HIGH (ref 0.44–1.00)
GFR, Estimated: 8 mL/min — ABNORMAL LOW (ref >60)
Glucose, Bld: 178 mg/dL — ABNORMAL HIGH (ref 70–99)
Phosphorus: 4.6 mg/dL (ref 2.5–4.6)
Potassium: 3.9 mmol/L (ref 3.5–5.1)
Sodium: 133 mmol/L — ABNORMAL LOW (ref 135–145)

## 2020-04-28 MED ORDER — HEPARIN SODIUM (PORCINE) 5000 UNIT/ML IJ SOLN
5000.0000 [IU] | Freq: Three times a day (TID) | INTRAMUSCULAR | Status: DC
  Administered 2020-04-28 – 2020-05-15 (×48): 5000 [IU] via SUBCUTANEOUS
  Filled 2020-04-28 (×51): qty 1

## 2020-04-28 MED ORDER — HEPARIN SODIUM (PORCINE) 1000 UNIT/ML IJ SOLN
INTRAMUSCULAR | Status: AC
  Filled 2020-04-28: qty 1

## 2020-04-28 MED ORDER — INSULIN GLARGINE 100 UNIT/ML ~~LOC~~ SOLN
30.0000 [IU] | Freq: Every day | SUBCUTANEOUS | Status: DC
  Administered 2020-04-28 – 2020-05-03 (×6): 30 [IU] via SUBCUTANEOUS
  Filled 2020-04-28 (×7): qty 0.3

## 2020-04-28 MED ORDER — DEXTROSE 50 % IV SOLN
1.0000 | Freq: Once | INTRAVENOUS | Status: AC
  Administered 2020-04-28: 50 mL via INTRAVENOUS
  Filled 2020-04-28: qty 50

## 2020-04-28 NOTE — Progress Notes (Addendum)
At 0332: CBG is "77"  She was able to take only 50 ml of milk. she said her mouth and toungue are hurting due to soreness. Tube feeding has been infusing.   At 0410: CBG is "11" She is sleeping, easy to wake up. No signs and symptoms of hypoglycemia. Dr. Shon Baton notified- D50 25mg  given. After all, her CBG is "144." Dr. Shon Baton aware. Will continue to assess  At 0530: patient started wheezing and RT called. Patient received breathing treatment.   At 0545: patient reports that she can't breath. HR went up to 140s. O2 sat dropped to 84%. RT called again.  Mucus plugged noted. RT did levage and changed the inner cannular.   Patient states that she feels much better. Heart rate is 110s. BP is stable. Dr. Shon Baton is aware. Will continue to assess.

## 2020-04-28 NOTE — Progress Notes (Signed)
NAME:  Wanda Ramirez, MRN:  [id number], DOB:  09-Apr-1971, LOS: 74 ADMISSION DATE:  02/13/2020   Interm history/ Subjective   Hypoglycemic again overnight despite insulin reduction.  Significant Hospital Events   9/14 Admission to general floor 9/23 Tunneled HD cath placed 9/26 Transferred to ICU for hypotension and CRRT 10/1 On CRRT and vasopressors 10/6 Extubated 10/18 tracheostomy performed 10/31 Restart vanc for staph in resp culture 11/2 Surgical debridement for worsening sacral wound 11/10 IMTS to assume care 11/11 febrile. merrem started. Surgery reconsulted 11/13 merrem stopped 11/14 hemodynamic significant sacral wound bleeding. Surgery placed stitch. 11/15 tx to ICU for hemorrhagic shock. Started on pressors. 11/17 transferred back to IMTS  Objective   Blood pressure 120/60, pulse (!) 108, temperature 98.5 F (36.9 C), temperature source Oral, resp. rate 14, height 5\' 2"  (1.575 m), weight 105 kg, SpO2 95 %.     Intake/Output Summary (Last 24 hours) at 04/28/2020 0815 Last data filed at 04/28/2020 0600 Gross per 24 hour  Intake 1310 ml  Output 650 ml  Net 660 ml   Filed Weights   04/23/20 0718 04/23/20 1054 04/26/20 0417  Weight: 106.2 kg 104.2 kg 105 kg    Examination: General: chronically ill appearing Neck: tracheostomy present  Cardiac: RRR, no LE edema Pulm: lungs clear  Consults:  Nephrology Palliative care Pulm  Significant Diagnostic Tests:  9/13 CXR>> bilateral patchy airspace opacities 9/21 VQ scan>> no PE 9/29 echocardiogram>> normal LVEF, findings consistent with increased RV volume and fluid overload, moderately reduced RV function 10/28 CT abdomen/pelvis >>scattered ground glass and consolidative opacities in the lung bases. Cystic space in the anterior, subpleural RML with septations favoring complicated pneumatocele. Non-obstructive punctuate nephrolithiasis 11/1 CT abdomen/pelvis>> persistent multifocal bibasilar airspace disease  with scarring. Bullous changes seen in RML. Decubitus ulcer overlying the sacrococcygeal junction with extensive gas seen in the left gluteal region involving the muscle and fat. No evidence of abscess. Findings consistent with cellulitis and myositis. No evidence of body destruction to suggest osteomyelitis.  Micro Data:  9/13, 9/26, 10/8, 10/24, 11/2, 11/9 blood cultures>>no growth 10/29 resp culture>> MSSA, resistant to clinda, erythromycin, clinda 11/2 wound culture>> e.coli resistant to ampicillin, cephalosporins, bactrim, e. Faecalis pan sensitive  Antimicrobials:  Remdesivir 9/14>>9/19 Cefepime 9/20>>9/29 Azithromycin 9/20>>9/25 Zosyn 10/8, 11/1>11/8 Vanc 10/8, 10/31>>11/5 Rocephin>>10/10>>10/16 Merrem 11/11>>11/13  Procedures/lines  Tracheostomy shiley 6.0 XLT 10/18 Sacral wound debridement--11/2  Tunneled HD line 9/27>> Central line 11/2>> Rectal tube 10/2>> Cortrak 10/4>> Wound vac 11/24>>  Central line 9/26-10/24 A-line 9/30>>10/13, 11/2-11/9 HD line 9/22-9/27 ETT 9/26-10/6 ETT 10/8-10/18  Labs    CBC Latest Ref Rng & Units 04/27/2020 04/25/2020 04/24/2020  WBC 4.0 - 10.5 K/uL 12.6(H) 11.7(H) 11.0(H)  Hemoglobin 12.0 - 15.0 g/dL 9.1(L) 7.9(L) 8.0(L)  Hematocrit 36 - 46 % 28.8(L) 24.7(L) 25.2(L)  Platelets 150 - 400 K/uL 416(H) 399 335   BMP Latest Ref Rng & Units 04/28/2020 04/27/2020 04/26/2020  Glucose 70 - 99 mg/dL 178(H) 138(H) 163(H)  BUN 6 - 20 mg/dL 102(H) 73(H) 57(H)  Creatinine 0.44 - 1.00 mg/dL 6.01(H) 4.77(H) 3.70(H)  Sodium 135 - 145 mmol/L 133(L) 137 137  Potassium 3.5 - 5.1 mmol/L 3.9 3.9 3.6  Chloride 98 - 111 mmol/L 95(L) 97(L) 98  CO2 22 - 32 mmol/L 25 24 26   Calcium 8.9 - 10.3 mg/dL 10.0 10.7(H) 9.8    Summary  29 yof admitted 02/17/20 for COVID-19 pneumonia who developed chronic respiratory failure requiring a tracheostomy after an extended stay in ICU. Her  hospitalization has been complicated by MSSA pneumonia as well as the  development of a necrotizing soft tissue infection involving her gluteus secondary to MDR organisms. She was transferred to the IMTS 11/10.   She is medically stable for discharge to [facility] pending bed availability.  Assessment & Plan:  Principal Problem:   COVID-19 Active Problems:   End stage renal disease on dialysis ([place])   Diabetes mellitus type 2 in obese (HCC)   OSA (obstructive sleep apnea)   HCAP (healthcare-associated pneumonia)   Acute respiratory failure with hypoxemia (HCC)   Encephalopathy acute   ARDS (adult respiratory distress syndrome) (HCC)   Pressure injury of skin   Aspiration into airway   Status post tracheostomy ([place])   Fever   Septic shock (HCC)   Cellulitis of buttock   Palliative care encounter   Sacral wound  Depression/anxiety.  --continue seroquel 50mg  BID. Continue klonopin daily --delirium precautions.  ESRD on iHD MWF. AVF clotted off resulting in need for use of tunneled HD line. On midodrine 10 mg TID Anemia of CKD. --Management per nephrology.  --Aranesp and midodrine per nephrology  Chronic hypoxic respiratory failure s/p tracheostomy (6.0 shiley XLT cuffless) secondary to COVID 19 pneumonia complicated by MSSA pneumonia--unable to be weaned from vent. Oxygen saturations being maintained around 100% on 5L --Wean supplemental O2 as able.  --pulm recommending trying to size down trach  --continue trach care. Management per trach team. PMV as able.  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 Wound vac placed 11/24 Plan -- continue wound vac. Frequent repositioning. -- Pain management: oxycodone 10mg  q6h, prn fentanyl 155mcg with dressing changes  Type 2 Diabetes. Still becoming hypoglycemic overnight. Decrease lantus to 30U. Continue novolog 7U and resistant SSI q4h  High risk malnutrition.  --Dysphagia 2 diet, thin liquids. Must use PMV when taking POs. --Supplement nutrition with  Tube feeds via cortrak.   Hypothyroidism. Continue synthroid.  Best practice:  CODE STATUS: DNR Diet: Dysphagia 2, thin liquids. Supplement with tube feeds DVT for prophylaxis: heparin Social considerations/Family communication:  Dispo: pending LTACH bed availability--no beds available as of 11/23.  Mitzi Hansen, MD Internal Medicine Resident PGY-2 [facility] Pager: [phone] 04/28/2020 8:15 AM    After 5pm on weekdays and 1pm on weekends: On Call Pager: [phone]

## 2020-04-28 NOTE — Progress Notes (Signed)
[facility] Progress Note   Assessment/ Plan:      OP HD: TTS  4h 84mn 450/800 2/2.25 bath 111.5kg Hep 2000 L AVF - darbe 50 ug q week, last 9/7 - calc 1.0 tiw - 9/9 Hb 10.0, tsat 22%  49yo female w/ ESRD on TTS HD admitted for COVID PNA on 02/13/20. She was intubated. SP CRRT around 9/26- 10/7. Now is on intermittent HD MWF here. She is sp trach. She had MSSA pna. DM2.Then she had large sacral decub requiring I&D.   1. COVID pna /sp trach - off vent on trach collar 2. Hypotension - BP's stable on midodrine 10 tid 3. Stage IV sacral decub:- sp I&D 11/2 and again 11/5 by gen surg. Surgery following. SP course of IV abx completed 11/12, getting hydrotherapy daily.  4. ESRD - switching back to TTS.sp CRRT 9/26- 10/7.HD Mon/ Wed/ Sat per holiday sched--> HD today 5. Volume - no edema on exam now, small UF w/ next HD 4. HD access: LUA AVF clotted here during acute covid. TDC x2 per IR 5. Anemia ckd/ critical illness - on darbe 150 weekly, 11/12 Ferritin >4000, iron sat 11.  With active infection and ^^ ferritin no IV iron. tx prbc's PRN.  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 Nutrition - getting NG Nepro tube feeds.  FEES with SLP 11/23.  Dysphagia 2, thin liquids, wearing PMV with it.   8. S/P MSSA HCAP/ VAP 9. DM2 - per pmd 10. Thrush: Nystatin QID 11. Dispo: would need LTACH- ? [facility]   Subjective:    For HD today.  Thrush being treated with nystatin.  Trying to eat but has some mouth pain.        Objective:   BP 120/60   Pulse (!) 108   Temp 98.5 F (36.9 C) (Oral)   Resp 14   Ht '5\' 2"'  (1.575 m)   Wt 105 kg   SpO2 95%   BMI 42.34 kg/m   Physical Exam: Gen: lying in bed, NAD HEENT: trach and coretrak in place CVS: RRR Resp:coarse bilateral Abd: soft Ext: no LE edema SKIN: large sacral decub not examined today [facility],  [facility] Recent Labs  Lab 04/22/20 0144 04/23/20 0308 04/24/20 0341 04/25/20 0450 04/26/20 0845 04/27/20 0151 04/28/20 0455  NA 136 135 139 138 137 137 133*  K 4.3 4.3 3.6 3.7 3.6 3.9 3.9  CL 99 92* 99 98 98 97* 95*  CO2 '23 26 25 24 26 24 25  ' GLUCOSE 149* 130* 112* 151* 163* 138* 178*  BUN 72* 108* 62* 93* 57* 73* 102*  CREATININE 4.07* 5.40* 3.63* 5.00* 3.70* 4.77* 6.01*  CALCIUM 9.9 10.0 9.4 10.4* 9.8 10.7* 10.0  PHOS 5.5* 6.4* 4.0 5.2* 3.6 4.2 4.6   CBC Recent Labs  Lab 04/23/20 0308 04/24/20 0341 04/25/20 0818 04/27/20 0151  WBC 12.7* 11.0* 11.7* 12.6*  HGB 8.4* 8.0* 7.9* 9.1*  HCT 26.4* 25.2* 24.7* 28.8*  MCV 93.6 92.0 92.5 91.4  PLT 463* 335 399 416*      Medications:    . sodium chloride   Intravenous Once  . B-complex with vitamin C  1 tablet Per Tube Daily  . chlorhexidine gluconate (MEDLINE KIT)  15 mL Mouth Rinse BID  . Chlorhexidine Gluconate Cloth  6 each Topical Daily  . clonazepam  0.25  mg Per Tube Daily  . collagenase   Topical Daily  . darbepoetin (ARANESP) injection - DIALYSIS  150 mcg Intravenous Q Wed-HD  . feeding supplement (PROSource TF)  45 mL Per Tube TID  . free water  20 mL Per Tube Q6H  . hydrocerin   Topical Daily  . insulin aspart  0-15 Units Subcutaneous Q4H  . insulin aspart  7 Units Subcutaneous Q4H  . insulin glargine  30 Units Subcutaneous Daily  . levothyroxine  125 mcg Per Tube Q0600  . midodrine  10 mg Per Tube TID WC  . multivitamin  1 tablet Per Tube QHS  . nutrition supplement (JUVEN)  1 packet Per Tube BID  . nystatin  5 mL Oral QID  . oxyCODONE  10 mg Per Tube Q6H  . pantoprazole sodium  40 mg Per Tube Daily  . QUEtiapine  50 mg Per Tube BID  . sodium chloride flush  10-40 mL Intracatheter Q12H  . Thrombi-Pad  1 each Topical Once     Madelon Lips, MD 04/28/2020, 11:25 AM

## 2020-04-29 LAB — GLUCOSE, CAPILLARY
Glucose-Capillary: 71 mg/dL (ref 70–99)
Glucose-Capillary: 118 mg/dL — ABNORMAL HIGH (ref 70–99)
Glucose-Capillary: 116 mg/dL — ABNORMAL HIGH (ref 70–99)
Glucose-Capillary: 89 mg/dL (ref 70–99)
Glucose-Capillary: 188 mg/dL — ABNORMAL HIGH (ref 70–99)
Glucose-Capillary: 161 mg/dL — ABNORMAL HIGH (ref 70–99)

## 2020-04-29 LAB — RENAL FUNCTION PANEL
Albumin: 2 g/dL — ABNORMAL LOW (ref 3.5–5.0)
Anion gap: 14 (ref 5–15)
BUN: 53 mg/dL — ABNORMAL HIGH (ref 6–20)
CO2: 25 mmol/L (ref 22–32)
Calcium: 9.3 mg/dL (ref 8.9–10.3)
Chloride: 95 mmol/L — ABNORMAL LOW (ref 98–111)
Creatinine, Ser: 3.54 mg/dL — ABNORMAL HIGH (ref 0.44–1.00)
GFR, Estimated: 15 mL/min — ABNORMAL LOW (ref >60)
Glucose, Bld: 87 mg/dL (ref 70–99)
Phosphorus: 3.1 mg/dL (ref 2.5–4.6)
Potassium: 4.2 mmol/L (ref 3.5–5.1)
Sodium: 134 mmol/L — ABNORMAL LOW (ref 135–145)

## 2020-04-29 NOTE — Progress Notes (Signed)
Wanda Ramirez Progress Note   Assessment/ Plan:      OP HD: TTS  4h 76mn 450/800 2/2.25 bath 111.5kg Hep 2000 L AVF - darbe 50 ug q week, last 9/7 - calc 1.0 tiw - 9/9 Hb 10.0, tsat 22%  49yo female w/ ESRD on TTS HD admitted for COVID PNA on 02/13/20. She was intubated. SP CRRT around 9/26- 10/7. Now is on intermittent HD MWF here. She is sp trach. She had MSSA pna. DM2.Then she had large sacral decub requiring I&D.   1. COVID pna /sp trach - off vent on trach collar 2. Hypotension - BP's stable on midodrine 10 tid 3. Stage IV sacral decub:- sp I&D 11/2 and again 11/5 by gen surg. Surgery following. SP course of IV abx completed 11/12, s/p hydrotherapy, woundvac placed 11/24. 4. ESRD - switching back to TTS.sp CRRT 9/26- 10/7.HD Mon/ Wed/ Sat per holiday sched--> HD Sat, next planned for 11/30 5. Volume - no edema on exam now, small UF w/ next HD, midodrine TID 4. HD access: LUA AVF clotted here during acute covid. TDC x2 per IR 5. Anemia ckd/ critical illness - on darbe 150 weekly, 11/12 Ferritin >4000, iron sat 11.  With active infection and ^^ ferritin no IV iron. tx prbc's PRN.  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 Nutrition - getting NG Nepro tube feeds.  FEES with SLP 11/23.  Dysphagia 2, thin liquids, wearing PMV with it.   8. S/P MSSA HCAP/ VAP 9. DM2 - per pmd 10. Thrush: Nystatin QID 11. Dispo: would need LTACH- ? [facility]   Subjective:    For HD today.  Thrush being treated with nystatin.  Trying to eat but has some mouth pain.        Objective:   BP 110/66   Pulse 100   Temp 98.7 F (37.1 C)   Resp 20   Ht '5\' 2"'  (1.575 m)   Wt 105 kg   SpO2 99%   BMI 42.34 kg/m   Physical Exam: Gen: lying in bed, NAD HEENT: trach and coretrak in place CVS: RRR Resp:coarse bilateral Abd: soft Ext: no LE edema SKIN: large sacral decub not examined  today [facility] BMET Recent Labs  Lab 04/23/20 0308 04/24/20 0341 04/25/20 0450 04/26/20 0845 04/27/20 0151 04/28/20 0455 04/29/20 0141  NA 135 139 138 137 137 133* 134*  K 4.3 3.6 3.7 3.6 3.9 3.9 4.2  CL 92* 99 98 98 97* 95* 95*  CO2 '26 25 24 26 24 25 25  ' GLUCOSE 130* 112* 151* 163* 138* 178* 87  BUN 108* 62* 93* 57* 73* 102* 53*  CREATININE 5.40* 3.63* 5.00* 3.70* 4.77* 6.01* 3.54*  CALCIUM 10.0 9.4 10.4* 9.8 10.7* 10.0 9.3  PHOS 6.4* 4.0 5.2* 3.6 4.2 4.6 3.1   CBC Recent Labs  Lab 04/23/20 0308 04/24/20 0341 04/25/20 0818 04/27/20 0151  WBC 12.7* 11.0* 11.7* 12.6*  HGB 8.4* 8.0* 7.9* 9.1*  HCT 26.4* 25.2* 24.7* 28.8*  MCV 93.6 92.0 92.5 91.4  PLT 463* 335 399 416*      Medications:    . sodium chloride   Intravenous Once  . B-complex with vitamin C  1 tablet Per Tube Daily  . chlorhexidine gluconate (MEDLINE KIT)  15 mL Mouth Rinse BID  . Chlorhexidine Gluconate Cloth  6 each Topical Daily  .  clonazepam  0.25 mg Per Tube Daily  . collagenase   Topical Daily  . darbepoetin (ARANESP) injection - DIALYSIS  150 mcg Intravenous Q Wed-HD  . feeding supplement (PROSource TF)  45 mL Per Tube TID  . free water  20 mL Per Tube Q6H  . heparin injection (subcutaneous)  5,000 Units Subcutaneous Q8H  . hydrocerin   Topical Daily  . insulin aspart  0-15 Units Subcutaneous Q4H  . insulin glargine  30 Units Subcutaneous Daily  . levothyroxine  125 mcg Per Tube Q0600  . midodrine  10 mg Per Tube TID WC  . multivitamin  1 tablet Per Tube QHS  . nutrition supplement (JUVEN)  1 packet Per Tube BID  . nystatin  5 mL Oral QID  . oxyCODONE  10 mg Per Tube Q6H  . pantoprazole sodium  40 mg Per Tube Daily  . QUEtiapine  50 mg Per Tube BID  . sodium chloride flush  10-40 mL Intracatheter Q12H  . Thrombi-Pad  1 each Topical Once     Madelon Lips, MD 04/29/2020, 10:41 AM

## 2020-04-29 NOTE — Progress Notes (Signed)
120 ml of juice given for cbg 71

## 2020-04-29 NOTE — Progress Notes (Signed)
NAME:  Wanda Ramirez, MRN:  [id number], DOB:  [birth year], LOS: 56 ADMISSION DATE:  02/13/2020   Interm history/ Subjective   Overnight glucoses 126, 87, 71, 118 No other overnight events  She notes her throat feels a little better today. No complaints this morning.  Significant Hospital Events   9/14 Admission to general floor 9/23 Tunneled HD cath placed 9/26 Transferred to ICU for hypotension and CRRT 10/1 On CRRT and vasopressors 10/6 Extubated 10/18 tracheostomy performed 10/31 Restart vanc for staph in resp culture 11/2 Surgical debridement for worsening sacral wound 11/10 IMTS to assume care 11/11 febrile. merrem started. Surgery reconsulted 11/13 merrem stopped 11/14 hemodynamic significant sacral wound bleeding. Surgery placed stitch. 11/15 tx to ICU for hemorrhagic shock. Started on pressors. 11/17 transferred back to IMTS  Objective   Blood pressure 113/68, pulse (!) 102, temperature 98 F (36.7 C), temperature source Oral, resp. rate 16, height 5\' 2"  (1.575 m), weight 105 kg, SpO2 100 %.     Intake/Output Summary (Last 24 hours) at 04/29/2020 0531 Last data filed at 04/29/2020 0100 Gross per 24 hour  Intake 400 ml  Output 3250 ml  Net -2850 ml   Filed Weights   04/23/20 0718 04/23/20 1054 04/26/20 0417  Weight: 106.2 kg 104.2 kg 105 kg    Examination: General: chronically ill appearing Neck: tracheostomy present  Cardiac: RRR, no LE edema Pulm: lungs clear  Consults:  Nephrology Palliative care-signed off Trach team Gen surg-signed off  Significant Diagnostic Tests:  9/13 CXR>> bilateral patchy airspace opacities 9/21 VQ scan>> no PE 9/29 echocardiogram>> normal LVEF, findings consistent with increased RV volume and fluid overload, moderately reduced RV function 10/28 CT abdomen/pelvis >>scattered ground glass and consolidative opacities in the lung bases. Cystic space in the anterior, subpleural RML with septations favoring complicated  pneumatocele. Non-obstructive punctuate nephrolithiasis 11/1 CT abdomen/pelvis>> persistent multifocal bibasilar airspace disease with scarring. Bullous changes seen in RML. Decubitus ulcer overlying the sacrococcygeal junction with extensive gas seen in the left gluteal region involving the muscle and fat. No evidence of abscess. Findings consistent with cellulitis and myositis. No evidence of body destruction to suggest osteomyelitis.  Micro Data:  9/13, 9/26, 10/8, 10/24, 11/2, 11/9 blood cultures>>no growth 10/29 resp culture>> MSSA, resistant to clinda, erythromycin, clinda 11/2 wound culture>> e.coli resistant to ampicillin, cephalosporins, bactrim, e. Faecalis pan sensitive  Antimicrobials:  Remdesivir 9/14>>9/19 Cefepime 9/20>>9/29 Azithromycin 9/20>>9/25 Zosyn 10/8, 11/1>11/8 Vanc 10/8, 10/31>>11/5 Rocephin>>10/10>>10/16 Merrem 11/11>>11/13  Procedures/lines  Tracheostomy shiley 6.0 XLT 10/18 Sacral wound debridement--11/2  Tunneled HD line 9/27>>  Rectal tube 10/2>> Cortrak 10/4>> Wound vac 11/24>>  Central line 9/26-10/24 A-line 9/30>>10/13, 11/2-11/9 HD line 9/22-9/27 ETT 9/26-10/6 ETT 10/8-10/18 Central line 11/2>>11/25  Labs    CBC Latest Ref Rng & Units 04/27/2020 04/25/2020 04/24/2020  WBC 4.0 - 10.5 K/uL 12.6(H) 11.7(H) 11.0(H)  Hemoglobin 12.0 - 15.0 g/dL 9.1(L) 7.9(L) 8.0(L)  Hematocrit 36 - 46 % 28.8(L) 24.7(L) 25.2(L)  Platelets 150 - 400 K/uL 416(H) 399 335   BMP Latest Ref Rng & Units 04/29/2020 04/28/2020 04/27/2020  Glucose 70 - 99 mg/dL 87 178(H) 138(H)  BUN 6 - 20 mg/dL 53(H) 102(H) 73(H)  Creatinine 0.44 - 1.00 mg/dL 3.54(H) 6.01(H) 4.77(H)  Sodium 135 - 145 mmol/L 134(L) 133(L) 137  Potassium 3.5 - 5.1 mmol/L 4.2 3.9 3.9  Chloride 98 - 111 mmol/L 95(L) 95(L) 97(L)  CO2 22 - 32 mmol/L 25 25 24   Calcium 8.9 - 10.3 mg/dL 9.3 10.0 10.7(H)  Summary  77 yof admitted 02/17/20 for COVID-19 pneumonia who developed chronic respiratory failure  requiring a tracheostomy after an extended stay in ICU. Her hospitalization has been complicated by MSSA pneumonia as well as the development of a necrotizing soft tissue infection involving her gluteus secondary to MDR organisms. She was transferred to the IMTS 11/10.   She is medically stable for discharge to [facility] pending bed availability.  Assessment & Plan:  Principal Problem:   COVID-19 Active Problems:   End stage renal disease on dialysis ([place])   Diabetes mellitus type 2 in obese (HCC)   OSA (obstructive sleep apnea)   HCAP (healthcare-associated pneumonia)   Acute respiratory failure with hypoxemia (HCC)   Encephalopathy acute   ARDS (adult respiratory distress syndrome) (HCC)   Pressure injury of skin   Aspiration into airway   Status post tracheostomy ([place])   Fever   Septic shock (HCC)   Cellulitis of buttock   Palliative care encounter   Sacral wound  Throat pain. No erythema or tonsilar exudates appreciated on exam yesterday however left adenoid cartilage and right vocal cord was noted to be edematous on FEE on 11/23. Question if this and the pain she is currently having could be related to her cortrak. Unfortunately, she is not taking in a sufficient nutrition orally to be able to remove it.  Plan: since her pain is better today, will continue to monitor for now. If pain worsens, can consider ENT consult for laryngoscopy.   Depression/anxiety.  --continue seroquel 50mg  BID. Continue klonopin daily --delirium precautions.  ESRD on iHD MWF. AVF clotted off resulting in need for use of tunneled HD line. On midodrine 10 mg TID Anemia of CKD. --Management per nephrology.  --Aranesp and midodrine per nephrology  Chronic hypoxic respiratory failure s/p tracheostomy (6.0 shiley XLT cuffless) secondary to COVID 19 pneumonia complicated by MSSA pneumonia--unable to be weaned from vent. Oxygen saturations being maintained around 100% on 5L --Wean supplemental O2 as able.   --pulm recommending trying to size down trach  --continue trach care. Management per trach team. PMV as able.  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 Wound vac placed 11/24 Plan -- continue wound vac. Frequent repositioning. -- Pain management: oxycodone 10mg  q6h, prn fentanyl 150mcg with dressing changes  Type 2 Diabetes. Glucoses are still on the lower side. Will continue lantus 30U. D/c novolog 7U q4h. Continue resistant SSI  High risk malnutrition.  --Dysphagia 2 diet, thin liquids. Must use PMV when taking POs. Please assist pt with meals. --Supplement nutrition with Tube feeds via cortrak.   Hypothyroidism. Continue synthroid.  Best practice:  CODE STATUS: DNR Diet: Dysphagia 2, thin liquids. Supplement with tube feeds DVT for prophylaxis: heparin Social considerations/Family communication:  Dispo: pending LTACH bed availability--no beds available as of 11/23.  Mitzi Hansen, MD Internal Medicine Resident PGY-2 [facility] Pager: [phone] 04/29/2020 5:31 AM    After 5pm on weekdays and 1pm on weekends: On Call Pager: [phone]

## 2020-04-30 DIAGNOSIS — U071 COVID-19: Secondary | ICD-10-CM | POA: Diagnosis not present

## 2020-04-30 LAB — GLUCOSE, CAPILLARY
Glucose-Capillary: 156 mg/dL — ABNORMAL HIGH (ref 70–99)
Glucose-Capillary: 189 mg/dL — ABNORMAL HIGH (ref 70–99)
Glucose-Capillary: 143 mg/dL — ABNORMAL HIGH (ref 70–99)
Glucose-Capillary: 99 mg/dL (ref 70–99)
Glucose-Capillary: 255 mg/dL — ABNORMAL HIGH (ref 70–99)
Glucose-Capillary: 109 mg/dL — ABNORMAL HIGH (ref 70–99)

## 2020-04-30 LAB — RENAL FUNCTION PANEL
Albumin: 1.8 g/dL — ABNORMAL LOW (ref 3.5–5.0)
Anion gap: 15 (ref 5–15)
BUN: 78 mg/dL — ABNORMAL HIGH (ref 6–20)
CO2: 23 mmol/L (ref 22–32)
Calcium: 9.8 mg/dL (ref 8.9–10.3)
Chloride: 92 mmol/L — ABNORMAL LOW (ref 98–111)
Creatinine, Ser: 5.19 mg/dL — ABNORMAL HIGH (ref 0.44–1.00)
GFR, Estimated: 10 mL/min — ABNORMAL LOW (ref >60)
Glucose, Bld: 170 mg/dL — ABNORMAL HIGH (ref 70–99)
Phosphorus: 4.7 mg/dL — ABNORMAL HIGH (ref 2.5–4.6)
Potassium: 4.1 mmol/L (ref 3.5–5.1)
Sodium: 130 mmol/L — ABNORMAL LOW (ref 135–145)

## 2020-04-30 MED ORDER — NEPRO/CARBSTEADY PO LIQD
840.0000 mL | ORAL | Status: DC
  Administered 2020-04-30 – 2020-05-01 (×2): 840 mL
  Filled 2020-04-30: qty 948

## 2020-04-30 MED ORDER — QUETIAPINE FUMARATE 50 MG PO TABS
50.0000 mg | ORAL_TABLET | Freq: Every day | ORAL | Status: DC
  Administered 2020-05-01 – 2020-05-08 (×8): 50 mg
  Filled 2020-04-30 (×8): qty 1

## 2020-04-30 MED ORDER — PROSOURCE TF PO LIQD
45.0000 mL | Freq: Two times a day (BID) | ORAL | Status: DC
  Administered 2020-04-30 – 2020-05-02 (×4): 45 mL
  Filled 2020-04-30 (×4): qty 45

## 2020-04-30 MED ORDER — INSULIN ASPART 100 UNIT/ML ~~LOC~~ SOLN
0.0000 [IU] | SUBCUTANEOUS | Status: DC
  Administered 2020-04-30: 11 [IU] via SUBCUTANEOUS
  Administered 2020-04-30: 3 [IU] via SUBCUTANEOUS
  Administered 2020-05-01 – 2020-05-02 (×6): 4 [IU] via SUBCUTANEOUS
  Administered 2020-05-02 (×2): 3 [IU] via SUBCUTANEOUS
  Administered 2020-05-02: 4 [IU] via SUBCUTANEOUS
  Administered 2020-05-03: 3 [IU] via SUBCUTANEOUS
  Administered 2020-05-03: 4 [IU] via SUBCUTANEOUS
  Administered 2020-05-03: 3 [IU] via SUBCUTANEOUS
  Administered 2020-05-04 (×3): 4 [IU] via SUBCUTANEOUS
  Administered 2020-05-04: 3 [IU] via SUBCUTANEOUS
  Administered 2020-05-04: 7 [IU] via SUBCUTANEOUS
  Administered 2020-05-04 – 2020-05-05 (×3): 4 [IU] via SUBCUTANEOUS

## 2020-04-30 MED ORDER — NEPRO/CARBSTEADY PO LIQD
237.0000 mL | Freq: Two times a day (BID) | ORAL | Status: DC
  Administered 2020-05-01: 237 mL via ORAL
  Filled 2020-04-30: qty 237

## 2020-04-30 NOTE — Progress Notes (Signed)
NAME:  Wanda Ramirez, MRN:  [id number], DOB:  [birth year], LOS: 46 ADMISSION DATE:  02/13/2020   Interm history/ Subjective   No hypoglycemic events overnight Breakfast tray at bedside--about 50% ate. No throat pain this morning  Significant Hospital Events   9/14 Admission to general floor 9/23 Tunneled HD cath placed 9/26 Transferred to ICU for hypotension and CRRT 10/1 On CRRT and vasopressors 10/6 Extubated 10/18 tracheostomy performed 10/31 Restart vanc for staph in resp culture 11/2 Surgical debridement for worsening sacral wound 11/10 IMTS to assume care 11/11 febrile. merrem started. Surgery reconsulted 11/13 merrem stopped 11/14 hemodynamic significant sacral wound bleeding. Surgery placed stitch. 11/15 tx to ICU for hemorrhagic shock. Started on pressors. 11/17 transferred back to IMTS  Objective   Blood pressure 128/62, pulse 99, temperature 97.9 F (36.6 C), temperature source Oral, resp. rate 16, height 5\' 2"  (1.575 m), weight 105 kg, SpO2 96 %.     Intake/Output Summary (Last 24 hours) at 04/30/2020 0704 Last data filed at 04/29/2020 1200 Gross per 24 hour  Intake 300 ml  Output --  Net 300 ml   Filed Weights   04/23/20 0718 04/23/20 1054 04/26/20 0417  Weight: 106.2 kg 104.2 kg 105 kg    Examination: General: chronically ill appearing but in NAD Pulm: breathing comfortably on 5L via trach mask. Lungs clear Neuro: a/o   Consults:  Nephrology Palliative care-signed off Trach team Gen surg-signed off  Significant Diagnostic Tests:  9/13 CXR>> bilateral patchy airspace opacities 9/21 VQ scan>> no PE 9/29 echocardiogram>> normal LVEF, findings consistent with increased RV volume and fluid overload, moderately reduced RV function 10/28 CT abdomen/pelvis >>scattered ground glass and consolidative opacities in the lung bases. Cystic space in the anterior, subpleural RML with septations favoring complicated pneumatocele. Non-obstructive punctuate  nephrolithiasis 11/1 CT abdomen/pelvis>> persistent multifocal bibasilar airspace disease with scarring. Bullous changes seen in RML. Decubitus ulcer overlying the sacrococcygeal junction with extensive gas seen in the left gluteal region involving the muscle and fat. No evidence of abscess. Findings consistent with cellulitis and myositis. No evidence of body destruction to suggest osteomyelitis.  Micro Data:  9/13, 9/26, 10/8, 10/24, 11/2, 11/9 blood cultures>>no growth 10/29 resp culture>> MSSA, resistant to clinda, erythromycin, clinda 11/2 wound culture>> e.coli resistant to ampicillin, cephalosporins, bactrim, e. Faecalis pan sensitive  Antimicrobials:  Remdesivir 9/14>>9/19 Cefepime 9/20>>9/29 Azithromycin 9/20>>9/25 Zosyn 10/8, 11/1>11/8 Vanc 10/8, 10/31>>11/5 Rocephin>>10/10>>10/16 Merrem 11/11>>11/13  Procedures/lines  Tracheostomy shiley 6.0 XLT 10/18 Sacral wound debridement--11/2  Tunneled HD line 9/27>>  Rectal tube 10/2>> Cortrak 10/4>> Wound vac 11/24>>  Central line 9/26-10/24 A-line 9/30>>10/13, 11/2-11/9 HD line 9/22-9/27 ETT 9/26-10/6 ETT 10/8-10/18 Central line 11/2>>11/25  Labs    CBC Latest Ref Rng & Units 04/27/2020 04/25/2020 04/24/2020  WBC 4.0 - 10.5 K/uL 12.6(H) 11.7(H) 11.0(H)  Hemoglobin 12.0 - 15.0 g/dL 9.1(L) 7.9(L) 8.0(L)  Hematocrit 36 - 46 % 28.8(L) 24.7(L) 25.2(L)  Platelets 150 - 400 K/uL 416(H) 399 335   BMP Latest Ref Rng & Units 04/30/2020 04/29/2020 04/28/2020  Glucose 70 - 99 mg/dL 170(H) 87 178(H)  BUN 6 - 20 mg/dL 78(H) 53(H) 102(H)  Creatinine 0.44 - 1.00 mg/dL 5.19(H) 3.54(H) 6.01(H)  Sodium 135 - 145 mmol/L 130(L) 134(L) 133(L)  Potassium 3.5 - 5.1 mmol/L 4.1 4.2 3.9  Chloride 98 - 111 mmol/L 92(L) 95(L) 95(L)  CO2 22 - 32 mmol/L 23 25 25   Calcium 8.9 - 10.3 mg/dL 9.8 9.3 10.0    Summary  79 yof admitted 02/17/20 for  COVID-19 pneumonia who developed chronic respiratory failure requiring a tracheostomy after an  extended stay in ICU. Her hospitalization has been complicated by MSSA pneumonia as well as the development of a necrotizing soft tissue infection involving her gluteus secondary to MDR organisms. She was transferred to the IMTS 11/10.   She is medically stable for discharge to [facility] pending bed availability.  Assessment & Plan:  Principal Problem:   COVID-19 Active Problems:   End stage renal disease on dialysis ([place])   Diabetes mellitus type 2 in obese (HCC)   OSA (obstructive sleep apnea)   HCAP (healthcare-associated pneumonia)   Acute respiratory failure with hypoxemia (HCC)   Encephalopathy acute   ARDS (adult respiratory distress syndrome) (HCC)   Pressure injury of skin   Aspiration into airway   Status post tracheostomy ([place])   Fever   Septic shock (HCC)   Cellulitis of buttock   Palliative care encounter   Sacral wound   Chronic hypoxic respiratory failure s/p tracheostomy (6.0 shiley XLT cuffless) secondary to COVID 19 pneumonia complicated by MSSA pneumonia--unable to be weaned from vent. --Wean supplemental O2 as able.  --continue trach care. Management per trach team. PMV as able.  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 Wound vac placed 11/24 Plan -- continue wound vac. Frequent repositioning. -- Pain management: oxycodone 10mg  q6h, prn fentanyl 163mcg with dressing changes  ESRD on iHD MWF. AVF clotted off resulting in need for use of tunneled HD line. On midodrine 10 mg TID Anemia of CKD. --Management per nephrology.  --Aranesp and midodrine per nephrology  Depression/anxiety.  --decrease seroquel to qhs. Continue klonopin daily  Type 2 Diabetes. CBGs at goal. Will continue lantus 30U and resistant SSI  High risk malnutrition. Oral intake is improving and I suspect that cortrak may be able to be removed soon. --Dysphagia 2 diet, thin liquids. Must use PMV when taking POs. Please assist pt with  meals. --Supplement nutrition with Tube feeds via cortrak.  --dietician consulted to start calorie count  Hypothyroidism. Continue synthroid.  Best practice:  CODE STATUS: DNR Diet: Dysphagia 2, thin liquids. Supplement with tube feeds DVT for prophylaxis: heparin Social considerations/Family communication:  Dispo: pending LTACH bed availability--no beds available as of 11/23.  Mitzi Hansen, MD Internal Medicine Resident PGY-2 [facility] Pager: [phone] 04/30/2020 7:04 AM    After 5pm on weekdays and 1pm on weekends: On Call Pager: [phone]

## 2020-04-30 NOTE — Plan of Care (Signed)

## 2020-04-30 NOTE — Discharge Summary (Signed)
Name: Wanda Ramirez MRN: [id number] DOB: 1971/03/03 49 y.o. PCP: Welford Roche, NP  Date of Admission: 02/13/2020  6:45 PM Date of Discharge: 05/15/2020 Attending Physician: Lucious Groves, DO  Discharge Diagnosis: 1. Acute hypoxic respiratory failure s/p tracheostomy 2. COVID-19 pneumonia 3. MSSA penumonia 4. 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 ESRD on iHD 6. Anemia of CKD 7. Depression/Anxiety 8. Type 2 Diabetes Mellitus 9. High risk malnutrition 10. Hypothyroidism  Discharge Medications: Allergies as of 05/15/2020      Reactions   Hydrocodone Itching, Nausea And Vomiting      Medication List    STOP taking these medications   acetaminophen 325 MG tablet Commonly known as: TYLENOL Replaced by: acetaminophen 160 MG/5ML solution   albuterol 108 (90 Base) MCG/ACT inhaler Commonly known as: VENTOLIN HFA Replaced by: albuterol (2.5 MG/3ML) 0.083% nebulizer solution   amLODipine 10 MG tablet Commonly known as: NORVASC   Auryxia 1 GM 210 MG(Fe) tablet Generic drug: ferric citrate   benzonatate 100 MG capsule Commonly known as: TESSALON   calcium acetate 667 MG capsule Commonly known as: PHOSLO   cinacalcet 90 MG tablet Commonly known as: SENSIPAR   famotidine 40 MG tablet Commonly known as: PEPCID   glimepiride 2 MG tablet Commonly known as: AMARYL   glimepiride 4 MG tablet Commonly known as: AMARYL   HUMULIN R 500 UNIT/ML injection Generic drug: insulin regular human CONCENTRATED   liraglutide 18 MG/3ML Sopn Commonly known as: VICTOZA   loratadine 10 MG tablet Commonly known as: CLARITIN   metoprolol succinate 50 MG 24 hr tablet Commonly known as: TOPROL-XL   omeprazole 40 MG capsule Commonly known as: PRILOSEC   ondansetron 4 MG tablet Commonly known as: ZOFRAN Replaced by: ondansetron 4 MG/2ML Soln injection   Oxycodone HCl 10 MG Tabs   pregabalin 75 MG capsule Commonly known as: LYRICA   Rhopressa 0.02 %  Soln Generic drug: Netarsudil Dimesylate   Simbrinza 1-0.2 % Susp Generic drug: Brinzolamide-Brimonidine   tiotropium 18 MCG inhalation capsule Commonly known as: SPIRIVA   Travoprost (BAK Free) 0.004 % Soln ophthalmic solution Commonly known as: TRAVATAN     TAKE these medications   acetaminophen 160 MG/5ML solution Commonly known as: TYLENOL Take 20.3 mLs (650 mg total) by mouth every 6 (six) hours as needed for mild pain or fever (for temp greater than 101.5). Replaces: acetaminophen 325 MG tablet   albuterol (2.5 MG/3ML) 0.083% nebulizer solution Commonly known as: PROVENTIL Take 3 mLs (2.5 mg total) by nebulization every 3 (three) hours as needed for wheezing or shortness of breath (secreations). Replaces: albuterol 108 (90 Base) MCG/ACT inhaler   aspirin EC 81 MG tablet Take 81 mg by mouth daily.   B-complex with vitamin C tablet Take 1 tablet by mouth daily. Start taking on: May 16, 2020   brimonidine 0.2 % ophthalmic solution Commonly known as: ALPHAGAN Place 1 drop into both eyes 3 (three) times daily.   brinzolamide 1 % ophthalmic suspension Commonly known as: AZOPT Place 1 drop into both eyes 3 (three) times daily.   camphor-menthol lotion Commonly known as: SARNA Apply topically as needed for itching.   chlorhexidine gluconate (MEDLINE KIT) 0.12 % solution Commonly known as: PERIDEX 15 mLs by Mouth Rinse route 2 (two) times daily.   Chlorhexidine Gluconate Cloth 2 % Pads Apply 6 each topically daily. Start taking on: May 16, 2020   clonazePAM 0.25 MG disintegrating tablet Commonly known as: KLONOPIN Take 1 tablet (0.25 mg total)  by mouth daily. Start taking on: May 16, 2020   collagenase ointment Commonly known as: SANTYL Apply topically daily. Start taking on: May 16, 2020   Darbepoetin Alfa 150 MCG/0.3ML Sosy injection Commonly known as: ARANESP Inject 0.3 mLs (150 mcg total) into the vein every Thursday with  hemodialysis. Start taking on: May 17, 2020   feeding supplement (GLUCERNA SHAKE) Liqd Take 237 mLs by mouth 3 (three) times daily with meals.   nutrition supplement (JUVEN) Pack Take 1 packet by mouth 2 (two) times daily between meals.   heparin 5000 UNIT/ML injection Inject 1 mL (5,000 Units total) into the skin every 8 (eight) hours.   hydrocerin Crea Apply 1 application topically daily.   insulin aspart 100 UNIT/ML injection Commonly known as: novoLOG Inject 0-6 Units into the skin every 4 (four) hours.   insulin aspart 100 UNIT/ML injection Commonly known as: novoLOG Inject 2 Units into the skin 3 (three) times daily with meals as needed for high blood sugar (Give 2 units of apart with each meal if patient is likely to eat 50% or more of her meal.).   insulin glargine 100 UNIT/ML injection Commonly known as: LANTUS Inject 0.03 mLs (3 Units total) into the skin daily.   latanoprost 0.005 % ophthalmic solution Commonly known as: XALATAN Place 1 drop into both eyes in the morning. Start taking on: May 16, 2020   levothyroxine 125 MCG tablet Commonly known as: SYNTHROID Take 125 mcg by mouth daily before breakfast.   lip balm ointment Apply topically as needed for lip care.   midodrine 10 MG tablet Commonly known as: PROAMATINE Take 1 tablet (10 mg total) by mouth 3 (three) times daily with meals.   multivitamin Tabs tablet Take 1 tablet by mouth at bedtime.   ondansetron 4 MG/2ML Soln injection Commonly known as: ZOFRAN Inject 2 mLs (4 mg total) into the vein every 6 (six) hours as needed for nausea or vomiting. Replaces: ondansetron 4 MG tablet   pantoprazole 40 MG tablet Commonly known as: PROTONIX Take 1 tablet (40 mg total) by mouth daily. Start taking on: May 16, 2020   polyethylene glycol 17 g packet Commonly known as: MIRALAX / GLYCOLAX Take 17 g by mouth daily as needed for mild constipation.   QUEtiapine 50 MG tablet Commonly  known as: SEROQUEL Take 1 tablet (50 mg total) by mouth at bedtime.   sevelamer carbonate 0.8 g Pack packet Commonly known as: RENVELA Take 1.6 g by mouth 3 (three) times daily with meals.   silver nitrate applicators 75-25 % applicator Apply 1 Stick (1 application total) topically as needed for bleeding (During dressing changes).            Discharge Care Instructions  (From admission, onward)         Start     Ordered   05/15/20 0000  Discharge wound care:       Comments: Daily dressing changes to trach.   05/15/20 1107          Disposition and follow-up:   Ms.Aadhya L Eisler was discharged from [facility] in Stable condition.  At the hospital follow up visit please address:  Acute hypoxic respiratory failure s/p tracheostomy secondary to COVID 19 pneumonia and MSSA pneumonia. S/p tracheostomy Ensure she her trach site has continued to heal. If not closed by 4 weeks, will need ENT referral.   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 S/p I&D 11/2, 11/5. S/p wound vac placement 11/24,   removed 12/14 due to amount of drainage. Wound acare: BID wet to dry dressing changes with normal saline. Per general surgery, can consider reapplying vac in a week or so if drainage resolves.   ESRD on iHD. Managed by [facility]. Unfortunately, her AVF was found to be clotted off requiring the use of tunneled HD catheter. Discharge medications: midodrine 7m three times daily  Anemia of CKD Discharge medications: Aranesp per nephrology  Depression/Anxiety: Mood has overall improved. Continue Seroquel 50 mg daily at bedtime and Klonopin daily.  Type 2 Diabetes Mellitus: Blood sugar is relatively stable. She is on Lantus 3 units daily, meal coverage with novolog 2 units TID and SSI, very sensitive. Continue q4h CBG checks.  High risk malnutrition: Patient has lost 54 lbs since admission back in mid September.   Hypothyroidism Discharge medication:  synthroid 1274m daily  Labs / imaging needed at time of follow-up: RFP, CBG  Pending labs/ test needing follow-up: None  Follow-up Appointments:   Hospital Course: [age] F with dialysis dependent ESRD who [facility] for COVID 19 pneumonia. Her conditioned continued to deteriorate, becoming hypotensive, requiring transfer to the ICU on 9/26 for ventilator support, pressor support and CRRT. Unfortunately, she was unable to be weaned off the ventilator and tracheostomy was performed 03/19/20. Following this, she developed MSSA pneumonia.  Her hospitalization was complicated by the development of a necrotizing wound involving her left gluteus maximus for which she underwent antibiotic treatment. She underwent surgical debridement on 04/03/20 and another one on 04/06/2020. Intra-operative cultures revealed MDR organisms (e.coli resistant to ampicillin, cephalosporins, bactrim and e. Faecalis pan sensitive). Hydrotherapy improved wound healing and a wound vac was placed 11/24.  Wound VAC was discontinued on 12/13 with plan to reevaluate for possible MWF VAC resumption in 1 week.  She continued to be on trach collar with no further complications. Plan for decannulation today. Patient has progressively improved clinically.   Discharge Vitals:   BP 111/65   Pulse (!) 104   Temp 98.5 F (36.9 C) (Oral)   Resp 20   Ht 5' 2" (1.575 m)   Wt 100 kg   SpO2 99%   BMI 40.32 kg/m   Pertinent Labs, Studies, and Procedures:   9/13 CXR>> bilateral patchy airspace opacities 9/21 VQ scan>> no PE 9/29 echocardiogram>> normal LVEF, findings consistent with increased RV volume and fluid overload, moderately reduced RV function 10/28 CT abdomen/pelvis >>scattered ground glass and consolidative opacities in the lung bases. Cystic space in the anterior, subpleural RML with septations favoring complicated pneumatocele. Non-obstructive punctuate nephrolithiasis 11/1 CT  abdomen/pelvis>> persistent multifocal bibasilar airspace disease with scarring. Bullous changes seen in RML. Decubitus ulcer overlying the sacrococcygeal junction with extensive gas seen in the left gluteal region involving the muscle and fat. No evidence of abscess. Findings consistent with cellulitis and myositis. No evidence of body destruction to suggest osteomyelitis.   Discharge Instructions: Discharge Instructions    Diet - low sodium heart healthy   Complete by: As directed    Discharge instructions   Complete by: As directed    Ms. AlAlvidrezIt was a pleasure taking care of you. We wish you a continued recovery in rehab.   Discharge wound care:   Complete by: As directed    Daily dressing changes to trach.   Increase activity slowly   Complete by: As directed       Signed: BlDelice BisonDO PGY-3  05/15/2020 11:16 AM

## 2020-04-30 NOTE — Consult Note (Signed)
[place] Nurse wound follow up Patient receiving care in [facility] 2W35. Wound type: surgical buttocks wound Measurement: na Wound bed: red with exposed bone and strong odor.  Drainage (amount, consistency, odor) heavy amount (45ml in cannister emptied by bedside RN this AM) of thick, tan, purulent looking drainage. Wound bed bleeding slightly when existing foam removed Periwound: intact Dressing procedure/placement/frequency: All pieces of white and black foam removed from tunnels and wound bed. Two pieces of white foam placed into tunnel extending to left thigh area and into right buttock area. Then three pieces of black foam placed into wound bed. Dressing bridged to left hip.  Drape applied, immediate seal obtained.  Patient tolerated well and was premedicated prior to dressing change. Bedside RN present with myself and colleague Julien Girt.  Additional supplies at bedside in a box.    Cathlean Marseilles Tamala Julian, MSN, RN, [place], Lysle Pearl, [facility] Wound Treatment Associate Pager [phone]

## 2020-04-30 NOTE — Progress Notes (Signed)
Nutrition Follow-up  DOCUMENTATION CODES:   Morbid obesity  INTERVENTION:  -Calorie count per MD -Nepro Shake po BID, each supplement provides 425 kcal and 19 grams protein    Transition to nocturnal TF via Cortrak: -Provide Nepro @ 21m/hr for 12 hours from 1800-0600 -475mProsource TF BID -2028mree water Q6H  TF regimen will provide 1592 kcals (meets 75% minimum estimated needs), 90 grams of protein, 610m52mee water  Continue Juven BID via Cortrak for wound healing  NUTRITION DIAGNOSIS:   Inadequate oral intake related to inability to eat as evidenced by NPO status.  Progressing, pt now on Dysphagia 2 diet with thin liquids  GOAL:   Patient will meet greater than or equal to 90% of their needs  Met with TF  MONITOR:   Vent status, Labs, Weight trends, TF tolerance, Skin, I & O's  REASON FOR ASSESSMENT:   Consult Calorie Count  ASSESSMENT:   Pt with PMH significant for ESRD on HD, chronic hypoxic respiratory failure on 3L [place] at baseline, sleep apnea, DM, HTN, HLD, hypothyroidism, and asthma. Pt admitted 02/17/20 for COVID-19 pneumonia who developed chronic respiratory failure requiring a tracheostomy after an extended stay in ICU. Pt's hospitalization has been complicated by MSSA pneumonia as well as the development of a necrotizing soft tissue infection involving her gluteus secondary to MDR organisms. She was transferred to the IMTS 11/10.  Pt had FEES 11/23 and was advanced to a dysphagia 2 diet with thin liquids when wearing PMV. Surgery noted that pt's wound is granulating nicely and wound VAC was placed 11/24. Pulmonology recommending attempting to downsize trach to help with swallowing. Pt has been c/o throat pain; MD questioning if Cortrak is contributing. MD ordered calorie count to determine if pt can meet her needs PO. Will transition pt to nocturnal TF to stimulate her appetite in the day time. Will also order oral nutrition supplements to help pt meet  kcal/protein needs po.   Current TF via Cortrak (gastric tip): Nepro @ 50ml17m 45ml 25mource TF TID, 20ml f56mwater Q6H  Wound VAC: 400ml ou20m today   Last HD 11/27, net UF 2.5L  Labs: Na 130 (L), Phosphorus 4.7 (H), CBGs [phone]: B-complex with vitamin C, Aranesp, ss novolog Q4H, 30 units lantus daily, rena-vit, juven BID  Diet Order:   Diet Order            DIET DYS 2 Room service appropriate? No; Fluid consistency: Thin  Diet effective now                 EDUCATION NEEDS:   Not appropriate for education at this time  Skin:  Skin Assessment: Skin Integrity Issues: Skin Integrity Issues:: Unstageable, Incisions Stage II: right neck Unstageable: mid sacrum Incisions: buttocks  Last BM:  11/22 type 7 via rectal tube  Height:   Ht Readings from Last 1 Encounters:  04/05/20 '5\' 2"'  (1.575 m)    Weight:   Wt Readings from Last 1 Encounters:  04/26/20 105 kg    Ideal Body Weight:  50 kg  BMI:  Body mass index is 42.34 kg/m.  Estimated Nutritional Needs:   Kcal:  2100-2300  Protein:  120-140 grams  Fluid:  1 L + UOP    Wanda Ramirez ALarkin Ina, LDN RD pager number and weekend/on-call pager number located in [facility]

## 2020-04-30 NOTE — Progress Notes (Addendum)
  Speech Language Pathology Treatment: Dysphagia;Passy Muir Speaking valve  Patient Details Name: Wanda Ramirez MRN: [id number] DOB: 01-12-1971 Today's Date: 04/30/2020 Time: 2446-2863 SLP Time Calculation (min) (ACUTE ONLY): 14 min  Assessment / Plan / Recommendation Clinical Impression  Breakfast tray at bedside and appeared to have eaten approximately 50% eggs, 25% sausage, several bites cream of wheat. Mastication eggs and propulsion was unremarkable with straw sip water. Possible intermittent penetration as she did clear throat after swallow which did not appear to be a result of recommended strategy.  Dietitian following as well- goal is for increased and safe intake to discontinue Cortrak when able- she is making some progress towards this. Continues to need encouragement and use of reverse trendelenburg to facilitate upright position.  Her Passy-Muir speaking valve worn throughout session achieving greater intensity and duration of true vocal cord phonation. No indications back pressure and vitals stable. Given her frequent periods of sleepiness, recommend she continue to wear valve with nursing and therapies with full supervision.    HPI HPI: 49 year old female with PMH of with end-stage renal disease on hemodialysis and chronic lung disease on chronic supplemental oxygen at 3 L/min. Admitted 9/13 with acute on chronic hypoxic respiratory failure due to COVID-19 pneumonia. Intubated 9/24-10/6, reintubated 10/8, CRRT 9/26-10/7, trach and bronch 10/18, trach collar 11/8. PMV trials began 10/27 with minimal toleration, SLP discharged 11/3 due to medical decline. Reconsulted 11/10. Pt currently has cuffed Shiley 6      SLP Plan  Continue with current plan of care       Recommendations  Diet recommendations: Dysphagia 2 (fine chop);Thin liquid Liquids provided via: Straw;Cup Medication Administration: Whole meds with puree Supervision: Patient able to self feed;Full supervision/cueing  for compensatory strategies Compensations: Minimize environmental distractions;Slow rate;Small sips/bites;Lingual sweep for clearance of pocketing;Clear throat intermittently Postural Changes and/or Swallow Maneuvers: Seated upright 90 degrees      Patient may use Passy-Muir Speech Valve: During all therapies with supervision PMSV Supervision: Full         Oral Care Recommendations: Oral care BID Follow up Recommendations: LTACH;Skilled Nursing facility SLP Visit Diagnosis: Dysphagia, oropharyngeal phase (R13.12) Plan: Continue with current plan of care                      [name] 04/30/2020, 9:32 AM  Orbie Pyo Colvin Caroli.Ed Risk analyst [phone] Office [phone]

## 2020-04-30 NOTE — Progress Notes (Signed)
[facility] Progress Note  Subjective: pt seen in room  Vitals:   04/30/20 0904 04/30/20 1112 04/30/20 1211 04/30/20 1323  BP:  (!) 103/58    Pulse: (!) 108 100 (!) 103   Resp: 15 19 19 19  Temp:  97.6 F (36.4 C)    TempSrc:  Oral    SpO2: 97% 99%    Weight:      Height:        Exam: OP HD:TTS  4h 15min 450/800 2/2.25 bath 111.5kg Hep 2000 L AVF - darbe 50 ug q week, last 9/7 - calc 1.0 tiw - 9/9 Hb 10.0, tsat 22%  49 yo female w/ ESRD on TTS HD admitted for COVID PNA on 02/13/20. She was intubated. SP CRRT around 9/26- 10/7. Now is on intermittent HD MWF here. She is sp trach. She had MSSA pna. DM2.Then she had large sacral decub requiring I&D.    Assessment/ Plan: 1. COVID pna /sp trach - off vent on trach collar 2. Hypotension - BP's stable on midodrine 10 tid 3. Stage IV sacral decub:- sp I&D 11/2 and again 11/5 by gen surg. Surgery following. SP course of IV abx completed 11/12, s/p hydrotherapy, woundvac placed 11/24. 4. ESRD - HD here on TTS schedule. SP CRRT. HD tomorrow 5. Volume - no edema on exam, small UF w/nextHD, midodrine TID. Ave UF 1-2 L here of late.  4. HD access: LUA AVF clotted here during acute covid. TDC x2 per IR 5. Anemia ckd/ critical illness - on darbe 150 weekly, 11/12 Ferritin >4000, iron sat 11. With active infection and ^^ ferritin no IV iron. tx prbc's PRN.  Rechecked iron panel-- Ferritinn 2777 and %sat 18, hold off on IV iron for now 6. 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 Nutrition - getting NG Nepro tube feeds.  FEES with SLP 11/23.  Dysphagia 2, thin liquids, wearing PMV with it.   8. S/P MSSA HCAP/ VAP 9. DM2 - per pmd 10. Thrush: Nystatin QID 11. Dispo: would need LTACH- ? [facility]     Rob  04/30/2020, 2:35 PM   Recent Labs  Lab 04/25/20 0818 04/26/20 0845 04/27/20 0151 04/28/20 0455 04/29/20 0141 04/30/20 0027  K  --    < > 3.9   < > 4.2 4.1  BUN  --    < > 73*   < > 53*  78*  CREATININE  --    < > 4.77*   < > 3.54* 5.19*  CALCIUM  --    < > 10.7*   < > 9.3 9.8  PHOS  --    < > 4.2   < > 3.1 4.7*  HGB 7.9*  --  9.1*  --   --   --    < > = values in this interval not displayed.   Inpatient medications: . sodium chloride   Intravenous Once  . B-complex with vitamin C  1 tablet Per Tube Daily  . chlorhexidine gluconate (MEDLINE KIT)  15 mL Mouth Rinse BID  . Chlorhexidine Gluconate Cloth  6 each Topical Daily  . clonazepam  0.25 mg Per Tube Daily  . collagenase   Topical Daily  . darbepoetin (ARANESP) injection - DIALYSIS  150 mcg Intravenous Q Wed-HD  . feeding supplement (NEPRO CARB STEADY)  237 mL Oral BID BM  . feeding supplement (NEPRO CARB STEADY)  840 mL Per Tube Q24H  . feeding supplement (PROSource TF)  45   mL Per Tube BID  . free water  20 mL Per Tube Q6H  . heparin injection (subcutaneous)  5,000 Units Subcutaneous Q8H  . hydrocerin   Topical Daily  . insulin aspart  0-20 Units Subcutaneous Q4H  . insulin glargine  30 Units Subcutaneous Daily  . levothyroxine  125 mcg Per Tube Q0600  . midodrine  10 mg Per Tube TID WC  . multivitamin  1 tablet Per Tube QHS  . nutrition supplement (JUVEN)  1 packet Per Tube BID  . nystatin  5 mL Oral QID  . oxyCODONE  10 mg Per Tube Q6H  . pantoprazole sodium  40 mg Per Tube Daily  . [START ON 05/01/2020] QUEtiapine  50 mg Per Tube QHS  . sodium chloride flush  10-40 mL Intracatheter Q12H  . Thrombi-Pad  1 each Topical Once   . sodium chloride 250 mL (04/08/20 2040)   sodium chloride, acetaminophen (TYLENOL) oral liquid 160 mg/5 mL, albuterol, camphor-menthol, fentaNYL (SUBLIMAZE) injection, influenza vac split quadrivalent PF, lip balm, ondansetron (ZOFRAN) IV, polyethylene glycol, silver nitrate applicators, sodium chloride flush

## 2020-04-30 NOTE — Progress Notes (Signed)
NAME:  Wanda Ramirez, MRN:  [id number], DOB:  05/17/1971, LOS: 38 ADMISSION DATE:  02/13/2020, CONSULTATION DATE:  9/24 REFERRING MD:  Heber Troy, CHIEF COMPLAINT:  Dyspnea   Brief History   49 yo female admitted with COVID 19 pneumonia on 9/13, had been diagnosed with COVID on 9/9.  On 9/24 her condition worsened and PCCM was consulted, she was sent to the ICU and treated with BIPAP, then eventually intubated.  Started on CRRT through a tunneled HD cath. Extubated on 10/6 but re-intubated on 10/8. Now sp trach tolerating trach collar.   Past Medical History  Sleep apnea Morbid obesity Hypothyroidism Hypertension GERD  End-stage renal disease on HD MWF Diabetes Asthma  Significant Hospital Events   9/14 admit 9/25 on BiPAP with Precedex overnight 9/26 -unable to run CRRT with prone ventilation.  Resume supine ventilation but still unable to draw blood back. 9/27 transferred to 52m. IR replaced HD catheter 10/1 on CRRT and vasopressors 10/6 - extubated 10/7 - had some stridor and increased WOB so placed on BIPAP and dexamethasone given.  10/15 weaning sedation. midaz off 10/16 move to 9M 10/18 s/p 6 proximal XLT Shiley tracheostomy  10/19: Resumed Eliquis low dose. Per Dr. Thompson Caul note 10/7 will need to be on [facility] for 1 month then reasses 10/19: Episode of hypoglycemia 55. 10/21 hypoglycemic again. levemir and tubefeed coverage decreased. Low grade temp->sputum culture send had vomiting event on 10/19 w/ concern for aspiration. Fent was stopped 10/20. Still gets anxious at night. Required increased precedex. Added clonazepam. 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 Surgery placed stitch. 11/15 hypotensive. Transferred back on ICU for pressor support 11/16 Off pressors Consults:  Nephrology General Surgery Palliative Care Procedures:  9/23 tunneled HD cath placement >  10/8 ETT > 10/18 10/18 6 proximal XLT Shiley tracheostomy  > 11/2 Sacral wound debridement Significant Diagnostic Tests:  9/21 Vas ultrasound> thrombosed LUE fistula 9/21 VQ scan > negative for PE 9/29 echo> LVEF 60-65%, flat ventrcile consistent with RV pressure overload, RV severely enlarged, moderate reduction in RV function  Micro Data:  9/13 blood > neg   9/14 sars cov 2 > pos 9/26 blood > neg 10/8 resp > MSSA 10/8 blood > negative  10/21 sputum > Normal flora  10/25 Blood > ngtd x 3 days 10/25 Sputum  > few diphtheroids  10/25 Blood >>negative 10/29 Sputum >> Staph aureus 11/2 Blood>> negative 11/2 sputum >> negative 11/2 Wound >> E. Coli, enterococcus faecalis  11/15 Tracheal aspirate >> few gram negative rods, moderate gram variable rods Antimicrobials:  9/14 remdesivir > 9/19 9/15 tocilizumab > x1 9/20 cefepime > 9/29 9/20 azithro >  9/25 10/8 vanc > 10/9 10/8 zosyn > x1 10/10 ceftriaxone > 10/16 10/31 vancomycin >> 1/7 11/2 zosyn>>11/8 11/11 Merrem >>11/13 Interim history/subjective:  Laying in bed in no acute distress however appears sleepy. Follows commands. Speech using PMSV with supervision.  Objective   Blood pressure 117/70, pulse (!) 110, temperature 98.6 F (37 C), temperature source Oral, resp. rate 15, height 5\' 2"  (1.575 m), weight 105 kg, SpO2 99 %.  FiO2 (%):  [25 %-28 %] 25 %   Intake/Output Summary (Last 24 hours) at 04/30/2020 1929 Last data filed at 04/30/2020 0900 Gross per 24 hour  Intake 60 ml  Output 400 ml  Net -340 ml   Filed Weights   04/23/20 0718 04/23/20 1054 04/26/20 0417  Weight: 106.2 kg  104.2 kg 105 kg    Physical Exam: General: Obese female, no acute distress, drowsy HEENT: #6 cuffless proximal XLT, c/d/i, no tracheal secretions, OP clear, MMM Eyes: EOMI, no scleral icterus Respiratory: Clear to auscultation bilaterally.  No crackles, wheezing or rales Cardiovascular: RRR, -M/R/G, no JVD Extremities:-Edema,-tenderness Neuro: AAO x4, CNII-XII grossly intact, follows commands Skin: Intact, no rashes or bruising Psych: Normal mood, normal affect   Resolved Hospital Problem list   Thrombocytopenia  MSSA PNA  Nausea  Agitated delirium Shock  Assessment & Plan:     Acute hypoxic respiratory failure from COVID 19 pneumonia with ARDS complicated by MSSA PNA. Failure to wean s/p tracheostomy Suspected OSA/OHS Tracheostomy placed 10/18.  P: Tolerating trach collar Continue #6 uncuffed with secretions, consider down size given strong cough, may help with swallowing issues Routine trach care Encourage pulmonary hygiene As needed bronchodilators Obtain ABG in am to assess hypercarbia. If elevated she will likely need nightly BiPAP for suspected OHS/OSA   Best practice:  Per primary  Labs:   CMP Latest Ref Rng & Units 04/30/2020 04/29/2020 04/28/2020  Glucose 70 - 99 mg/dL 170(H) 87 178(H)  BUN 6 - 20 mg/dL 78(H) 53(H) 102(H)  Creatinine 0.44 - 1.00 mg/dL 5.19(H) 3.54(H) 6.01(H)  Sodium 135 - 145 mmol/L 130(L) 134(L) 133(L)  Potassium 3.5 - 5.1 mmol/L 4.1 4.2 3.9  Chloride 98 - 111 mmol/L 92(L) 95(L) 95(L)  CO2 22 - 32 mmol/L 23 25 25   Calcium 8.9 - 10.3 mg/dL 9.8 9.3 10.0  Total Protein 6.5 - 8.1 g/dL - - -  Total Bilirubin 0.3 - 1.2 mg/dL - - -  Alkaline Phos 38 - 126 U/L - - -  AST 15 - 41 U/L - - -  ALT 0 - 44 U/L - - -    CBC Latest Ref Rng & Units 04/27/2020 04/25/2020 04/24/2020  WBC 4.0 - 10.5 K/uL 12.6(H) 11.7(H) 11.0(H)  Hemoglobin 12.0 - 15.0 g/dL 9.1(L) 7.9(L) 8.0(L)  Hematocrit 36 - 46 % 28.8(L) 24.7(L) 25.2(L)  Platelets 150 - 400 K/uL  416(H) 399 335    ABG    Component Value Date/Time   PHART 7.250 (L) 04/03/2020 1030   PCO2ART 64.8 (H) 04/03/2020 1030   PO2ART 156 (H) 04/03/2020 1030   HCO3 28.4 (H) 04/03/2020 1030   TCO2 30 04/03/2020 1030   ACIDBASEDEF 2.0 04/03/2020 0907   O2SAT 99.0 04/03/2020 1030    CBG (last 3)  Recent Labs    04/30/20 0753 04/30/20 1109 04/30/20 [place]    Rodman Pickle, M.D. [facility] Pulmonary/Critical Care Medicine 04/30/2020 7:45 PM

## 2020-04-30 NOTE — Plan of Care (Signed)
  Problem: Respiratory: Goal: Will maintain a patent airway Outcome: Progressing   Problem: Respiratory: Goal: Complications related to the disease process, condition or treatment will be avoided or minimized Outcome: Progressing   Problem: Clinical Measurements: Goal: Respiratory complications will improve Outcome: Progressing   Problem: Nutrition: Goal: Adequate nutrition will be maintained Outcome: Progressing

## 2020-05-01 DIAGNOSIS — U071 COVID-19: Secondary | ICD-10-CM | POA: Diagnosis not present

## 2020-05-01 LAB — GLUCOSE, CAPILLARY
Glucose-Capillary: 118 mg/dL — ABNORMAL HIGH (ref 70–99)
Glucose-Capillary: 151 mg/dL — ABNORMAL HIGH (ref 70–99)
Glucose-Capillary: 158 mg/dL — ABNORMAL HIGH (ref 70–99)
Glucose-Capillary: 165 mg/dL — ABNORMAL HIGH (ref 70–99)
Glucose-Capillary: 186 mg/dL — ABNORMAL HIGH (ref 70–99)
Glucose-Capillary: 193 mg/dL — ABNORMAL HIGH (ref 70–99)
Glucose-Capillary: 86 mg/dL (ref 70–99)
Glucose-Capillary: 92 mg/dL (ref 70–99)

## 2020-05-01 LAB — CBC
HCT: 25 % — ABNORMAL LOW (ref 36.0–46.0)
Hemoglobin: 8.2 g/dL — ABNORMAL LOW (ref 12.0–15.0)
MCH: 28.5 pg (ref 26.0–34.0)
MCHC: 32.8 g/dL (ref 30.0–36.0)
MCV: 86.8 fL (ref 80.0–100.0)
Platelets: 429 10*3/uL — ABNORMAL HIGH (ref 150–400)
RBC: 2.88 MIL/uL — ABNORMAL LOW (ref 3.87–5.11)
RDW: 14.1 % (ref 11.5–15.5)
WBC: 10.7 10*3/uL — ABNORMAL HIGH (ref 4.0–10.5)
nRBC: 0 % (ref 0.0–0.2)

## 2020-05-01 LAB — RENAL FUNCTION PANEL
Albumin: 1.9 g/dL — ABNORMAL LOW (ref 3.5–5.0)
Anion gap: 18 — ABNORMAL HIGH (ref 5–15)
BUN: 127 mg/dL — ABNORMAL HIGH (ref 6–20)
CO2: 22 mmol/L (ref 22–32)
Calcium: 10.4 mg/dL — ABNORMAL HIGH (ref 8.9–10.3)
Chloride: 86 mmol/L — ABNORMAL LOW (ref 98–111)
Creatinine, Ser: 6.72 mg/dL — ABNORMAL HIGH (ref 0.44–1.00)
GFR, Estimated: 7 mL/min — ABNORMAL LOW (ref >60)
Glucose, Bld: 173 mg/dL — ABNORMAL HIGH (ref 70–99)
Phosphorus: 5.2 mg/dL — ABNORMAL HIGH (ref 2.5–4.6)
Potassium: 4.2 mmol/L (ref 3.5–5.1)
Sodium: 126 mmol/L — ABNORMAL LOW (ref 135–145)

## 2020-05-01 MED ORDER — DARBEPOETIN ALFA 150 MCG/0.3ML IJ SOSY
150.0000 ug | PREFILLED_SYRINGE | INTRAMUSCULAR | Status: DC
  Administered 2020-05-03: 150 ug via INTRAVENOUS
  Filled 2020-05-01 (×2): qty 0.3

## 2020-05-01 MED ORDER — NYSTATIN 100000 UNIT/ML MT SUSP
5.0000 mL | Freq: Four times a day (QID) | OROMUCOSAL | Status: DC
  Administered 2020-05-02 – 2020-05-09 (×25): 500000 [IU] via OROMUCOSAL
  Filled 2020-05-01 (×25): qty 5

## 2020-05-01 MED ORDER — HEPARIN SODIUM (PORCINE) 1000 UNIT/ML IJ SOLN
INTRAMUSCULAR | Status: AC
  Filled 2020-05-01: qty 4

## 2020-05-01 NOTE — Progress Notes (Signed)
0900 called to patient room because of respiratory distress.  Patient SPO2 in 80's and appears to have difficulty breathing.  Inner Cannula changed and was plugged off.  Patient immediately was relieved and able to take a deep breath.  SPO2 is now 98% with NAD.  RT to monitor.

## 2020-05-01 NOTE — Progress Notes (Signed)
OT Cancellation Note  Patient Details Name: Wanda Ramirez MRN: [id number] DOB: Mar 24, 1971   Cancelled Treatment:    Reason Eval/Treat Not Completed: Patient at procedure or test/ unavailable;Other (comment) Pt out of room at HD, will check back as time allows.   Lanier Clam., COTA/L Acute Rehabilitation Services [phone]   Ihor Gully 05/01/2020, 1:16 PM

## 2020-05-01 NOTE — Progress Notes (Signed)
RT note- Called to dialysis for patient in respiratory distress. Patient was being suctioned by RN and Velva Harman, NP. Moderate plug removed, patient is now resting comfortably, PMV remains off at this time with fio2 at 30%, 4-6L

## 2020-05-01 NOTE — Progress Notes (Signed)
[facility] Progress Note  Subjective: pt seen on HD  Vitals:   05/01/20 1300 05/01/20 1325 05/01/20 1340 05/01/20 1613  BP: (!) 132/92 112/85 122/81 (!) 141/100  Pulse:  (!) 111 (!) 106 (!) 113  Resp: '14 20 15   ' Temp:      TempSrc:      SpO2:  100% 100% 97%  Weight:      Height:        Exam: OP HD:TTS  4h 25mn 450/800 2/2.25 bath 111.5kg Hep 2000 L AVF - darbe 50 ug q week, last 9/7 - calc 1.0 tiw - 9/9 Hb 10.0, tsat 22%  49yo female w/ ESRD on TTS HD admitted for COVID PNA on 02/13/20. She was intubated. SP CRRT around 9/26- 10/7. Now is on intermittent HD MWF here. She is sp trach. She had MSSA pna. DM2.Then she had large sacral decub requiring I&D.    Assessment/ Plan: 1. COVID pna /sp trach - off vent on trach collar 2. Hypotension - BP's stable on midodrine 10 tid 3. Stage IV sacral decub:- sp I&D 11/2 and again 11/5 by gen surg. Surgery following. SP course of IV abx completed 11/12, s/p hydrotherapy, woundvac placed 11/24. 4. ESRD - HD here on TTS schedule. SP CRRT. HD today upstairs.  5. Volume - midodrine TID. Ave UF 2-2 .5 L as tol.  4. HD access: LUA AVF clotted here during acute covid. TDC x2 per IR 5. Anemia ckd/ critical illness - on darbe 150 weekly, 11/12 Ferritin >4000, iron sat 11. With active infection and ^^ ferritin no IV iron. tx prbc's PRN.  Rechecked iron panel-- Ferritinn 2777 and %sat 18, hold off on IV iron for now 6. 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 Nutrition - getting NG Nepro tube feeds.  FEES with SLP 11/23.  Dysphagia 2, thin liquids, wearing PMV with it.   8. S/P MSSA HCAP/ VAP 9. DM2 - per pmd 10. Thrush: Nystatin QID 11. Dispo: would need LTACH- ? [facility]     Rob SDoctor, hospital11/30/2021, 4:30 PM   Recent Labs  Lab 04/27/20 0151 04/28/20 0455 04/30/20 0027 05/01/20 0558  K 3.9   < > 4.1 4.2  BUN 73*   < > 78* 127*  CREATININE 4.77*   < > 5.19* 6.72*  CALCIUM 10.7*   < > 9.8 10.4*  PHOS  4.2   < > 4.7* 5.2*  HGB 9.1*  --   --  8.2*   < > = values in this interval not displayed.   Inpatient medications: . sodium chloride   Intravenous Once  . B-complex with vitamin C  1 tablet Per Tube Daily  . chlorhexidine gluconate (MEDLINE KIT)  15 mL Mouth Rinse BID  . Chlorhexidine Gluconate Cloth  6 each Topical Daily  . clonazepam  0.25 mg Per Tube Daily  . collagenase   Topical Daily  . [START ON 05/03/2020] darbepoetin (ARANESP) injection - DIALYSIS  150 mcg Intravenous Q Thu-HD  . feeding supplement (NEPRO CARB STEADY)  237 mL Oral BID BM  . feeding supplement (NEPRO CARB STEADY)  840 mL Per Tube Q24H  . feeding supplement (PROSource TF)  45 mL Per Tube BID  . free water  20 mL Per Tube Q6H  . heparin injection (subcutaneous)  5,000 Units Subcutaneous Q8H  . hydrocerin   Topical Daily  . insulin aspart  0-20 Units Subcutaneous Q4H  . insulin glargine  30 Units Subcutaneous  Daily  . levothyroxine  125 mcg Per Tube Q0600  . midodrine  10 mg Per Tube TID WC  . multivitamin  1 tablet Per Tube QHS  . nutrition supplement (JUVEN)  1 packet Per Tube BID  . nystatin  5 mL Oral QID  . oxyCODONE  10 mg Per Tube Q6H  . pantoprazole sodium  40 mg Per Tube Daily  . QUEtiapine  50 mg Per Tube QHS  . sodium chloride flush  10-40 mL Intracatheter Q12H  . Thrombi-Pad  1 each Topical Once   . sodium chloride 250 mL (04/08/20 2040)   sodium chloride, acetaminophen (TYLENOL) oral liquid 160 mg/5 mL, albuterol, camphor-menthol, fentaNYL (SUBLIMAZE) injection, influenza vac split quadrivalent PF, lip balm, ondansetron (ZOFRAN) IV, polyethylene glycol, silver nitrate applicators, sodium chloride flush

## 2020-05-01 NOTE — Progress Notes (Signed)
Physical Therapy Treatment Patient Details Name: Wanda Ramirez MRN: [id number] DOB: 1970-09-27 Today's Date: 05/01/2020    History of Present Illness 49 yo admitted 9/13 with Covid PNA, intubated 9/24-10/6, reintubated 10/8, CRRT 9/26-10/7, trach and bronch 10/18. Pt returned to vent 10/30-11/7. Pt with significant sacral wound to bone s/p I&D x 2. Pt with hemorrhage from wound 11/14, hemorrhagic shock 11/15. PMhx: ESRD TTS, obesity, HTN, GERD, hypothyroidism, DM, asthma    PT Comments    Pt agreeable to attempt mobility stating she feels much better than this am with mucous plug. Pt able to roll well particularly to right but has pain and difficulty achieving/maintaining supine for positioning and tilting. Pt with decreased attention and command following today with session limited by transporter arrival for HD. Will continue to follow to maximize tolerance for standing, sitting and strength.   VSS   Follow Up Recommendations  LTACH;Supervision/Assistance - 24 hour     Equipment Recommendations  Wheelchair (measurements PT);Wheelchair cushion (measurements PT);Hospital bed;Other (comment)    Recommendations for Other Services       Precautions / Restrictions Precautions Precautions: Fall;Other (comment) Precaution Comments: trach, cortrak, sacral wound with VAC Other Brace: bil prevalons    Mobility  Bed Mobility Overal bed mobility: Needs Assistance Bed Mobility: Rolling Rolling: Min guard         General bed mobility comments: pt able to roll with rail and cues, increased time. Pt unable to follow commands to bridge or shift hips without rolling requiring mod +2 to adjust hips in sidelying or supine  Transfers       Sit to Stand: Total assist         General transfer comment: tilt feature to 30 degrees but pt unable to maintain feet on plate and quad activation  Ambulation/Gait                 Stairs             Wheelchair Mobility     Modified Rankin (Stroke Patients Only)       Balance Overall balance assessment: Needs assistance           Standing balance-Leahy Scale: Zero Standing balance comment: 30 degrees with pt leaning left and unable to achieve neutral hip rotation for successful stand                            Cognition Arousal/Alertness: Awake/alert Behavior During Therapy: Flat affect Overall Cognitive Status: Impaired/Different from baseline Area of Impairment: Memory;Attention;Safety/judgement;Following commands;Problem solving                   Current Attention Level: Sustained Memory: Decreased short-term memory Following Commands: Follows one step commands with increased time;Follows one step commands inconsistently     Problem Solving: Requires verbal cues;Requires tactile cues;Slow processing General Comments: pt with decreased awareness of body position in bed and limited ability to follow commands to transition to supine for tilting      Exercises      General Comments        Pertinent Vitals/Pain Pain Score: 5  Pain Location: left hip Pain Descriptors / Indicators: Sore;Guarding Pain Intervention(s): Limited activity within patient's tolerance;Monitored during session;Repositioned    Home Living                      Prior Function            PT Goals (  current goals can now be found in the care plan section) Progress towards PT goals: Progressing toward goals    Frequency    Min 3X/week      PT Plan Current plan remains appropriate    Co-evaluation              AM-PAC PT "6 Clicks" Mobility   Outcome Measure  Help needed turning from your back to your side while in a flat bed without using bedrails?: A Little Help needed moving from lying on your back to sitting on the side of a flat bed without using bedrails?: A Lot Help needed moving to and from a bed to a chair (including a wheelchair)?: Total Help needed standing  up from a chair using your arms (e.g., wheelchair or bedside chair)?: Total Help needed to walk in hospital room?: Total Help needed climbing 3-5 steps with a railing? : Total 6 Click Score: 9    End of Session Equipment Utilized During Treatment: Oxygen Activity Tolerance: Patient tolerated treatment well Patient left: in bed;Other (comment) (with transporter present for move to HD) Nurse Communication: Mobility status;Precautions;Need for lift equipment PT Visit Diagnosis: Muscle weakness (generalized) (M62.81);Difficulty in walking, not elsewhere classified (R26.2);Pain     Time: 1747-1595 PT Time Calculation (min) (ACUTE ONLY): 23 min  Charges:  $Therapeutic Activity: 23-37 mins                     Bryauna Byrum P, PT Acute Rehabilitation Services Pager: [phone] Office: [place] 05/01/2020, 1:48 PM

## 2020-05-01 NOTE — Progress Notes (Signed)
HD#77 Subjective:  Overnight Events: none   Patient resting comfortably in bed. She is able to speak better with the valve in place. She states that she has been tolerating PO intake but it has been slow as she has not had an appetite. She continues to have a mild sore throat, but otherwise denies any new problems today.   Objective:  Vital signs in last 24 hours: Vitals:   05/01/20 0816 05/01/20 0830 05/01/20 0844 05/01/20 1141  BP: 140/85  140/85   Pulse:  (!) 116 (!) 115 (!) 104  Resp: 18 16 16 16   Temp:   98.7 F (37.1 C)   TempSrc:   Axillary   SpO2: 98% 97% 98% 99%  Weight:      Height:       Supplemental O2: Trach collar SpO2: 99 % O2 Flow Rate (L/min): 5 L/min FiO2 (%): 28 %   Physical Exam:  Physical Exam Constitutional:      General: She is not in acute distress.    Appearance: She is obese. She is ill-appearing.  HENT:     Head: Normocephalic and atraumatic.     Mouth/Throat:     Comments: Trach in place Cardiovascular:     Rate and Rhythm: Normal rate and regular rhythm.     Pulses: Normal pulses.     Heart sounds: Normal heart sounds.  Pulmonary:     Effort: Pulmonary effort is normal.     Breath sounds: Normal breath sounds.  Abdominal:     General: Abdomen is flat. Bowel sounds are normal.     Palpations: Abdomen is soft.  Musculoskeletal:        General: Normal range of motion.     Cervical back: Normal range of motion.  Skin:    General: Skin is warm and dry.  Neurological:     Mental Status: She is alert.     Filed Weights   04/23/20 0718 04/23/20 1054 04/26/20 0417  Weight: 106.2 kg 104.2 kg 105 kg     Intake/Output Summary (Last 24 hours) at 05/01/2020 1203 Last data filed at 05/01/2020 0800 Gross per 24 hour  Intake 2139.17 ml  Output 400 ml  Net 1739.17 ml   Net IO Since Admission: 24,958.23 mL [05/01/20 1203]  Pertinent Labs: CBC Latest Ref Rng & Units 05/01/2020 04/27/2020 04/25/2020  WBC 4.0 - 10.5 K/uL 10.7(H)  12.6(H) 11.7(H)  Hemoglobin 12.0 - 15.0 g/dL 8.2(L) 9.1(L) 7.9(L)  Hematocrit 36 - 46 % 25.0(L) 28.8(L) 24.7(L)  Platelets 150 - 400 K/uL 429(H) 416(H) 399    CMP Latest Ref Rng & Units 05/01/2020 04/30/2020 04/29/2020  Glucose 70 - 99 mg/dL 173(H) 170(H) 87  BUN 6 - 20 mg/dL 127(H) 78(H) 53(H)  Creatinine 0.44 - 1.00 mg/dL 6.72(H) 5.19(H) 3.54(H)  Sodium 135 - 145 mmol/L 126(L) 130(L) 134(L)  Potassium 3.5 - 5.1 mmol/L 4.2 4.1 4.2  Chloride 98 - 111 mmol/L 86(L) 92(L) 95(L)  CO2 22 - 32 mmol/L 22 23 25   Calcium 8.9 - 10.3 mg/dL 10.4(H) 9.8 9.3  Total Protein 6.5 - 8.1 g/dL - - -  Total Bilirubin 0.3 - 1.2 mg/dL - - -  Alkaline Phos 38 - 126 U/L - - -  AST 15 - 41 U/L - - -  ALT 0 - 44 U/L - - -    Imaging: No results found.  Assessment/Plan:   Principal Problem:   COVID-19 Active Problems:   End stage renal disease on dialysis ([place])  Diabetes mellitus type 2 in obese (HCC)   OSA (obstructive sleep apnea)   HCAP (healthcare-associated pneumonia)   Acute respiratory failure with hypoxemia (HCC)   Encephalopathy acute   ARDS (adult respiratory distress syndrome) (HCC)   Pressure injury of skin   Aspiration into airway   Status post tracheostomy ([place])   Fever   Septic shock (HCC)   Cellulitis of buttock   Palliative care encounter   Sacral wound   Patient Summary: Wanda Ramirez is a 49 y.o. with a pertinent PMH of OSA, morbid obesity, hypothyroidism, hypertension, GERD, ESRD, diabetes, asthma, who presented with shortness of breath and admitted for Covid pneumonia complicated by prolonged hospital stay including subsequent MSSA infection and necrotizing soft tissue infection..    Chronic hypoxic respiratory failure s/p tracheostomy secondary to [id number] pneumonia complicated by MSSA pneumonia: Patient on trach collar tolerating well with 5 L flow.  She is able to communicate better with valve in place. 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 -Continue pain management with oxycodone 10 mg every 6, as needed fentanyl 100 mcg with dressing changes.  ESRD Patient has tunneled cath in place for HD -Continue HD per nephrology -Continue midodrine 10 mg 3 times daily with HD -Continue Aranesp per nephrology   Depression/Anxiety: -Continue Seroquel nightly and Klonopin daily  Type 2 diabetes: -Continue Lantus 30 units daily and sliding scale insulin resistant  High risk for malnutrition: Patient has improved oral intake with waxing and waning appetite.  Nutrition consulted for calorie count with hopes to switch to full p.o. diet in the near future.  We will continue dysphagia 2 diet with thin liquids.  She needs PMV and assistance with meals.  She can also get supplemental tube feeds via core track per dietician.   Diet: Dysphagia 2 and thin liquids, PMV inplace and assistance needed IVF: None,None VTE: Heparin Code: DNR PT/OT recs: Pending improvement TOC recs: pending improvement   Dispo: Anticipated discharge to LTAC vs SNF pending improvement of PO intake and able to participate in HD.    Please contact the on call pager after 5 pm and on weekends at [phone].

## 2020-05-01 NOTE — Progress Notes (Signed)
Calorie Count Note  48 hour calorie count ordered.  Diet: DYS 2, thin liquids  Supplements: Nepro Shake BID  No tickets in calorie count envelope despite active calorie count order. One meal completion charted for 11/29 breakfast at 50% (407 kcal and 20 g protein). Unsure of supplement intake given lack of documentation.   Spoke with nursing regarding importance of proper documentation. Follow for further results.   Mariana Single RD, LDN Clinical Nutrition Pager listed in [place]

## 2020-05-01 NOTE — Plan of Care (Signed)
  Problem: Respiratory: Goal: Will maintain a patent airway Outcome: Progressing   Problem: Respiratory: Goal: Complications related to the disease process, condition or treatment will be avoided or minimized Outcome: Progressing   Problem: Clinical Measurements: Goal: Respiratory complications will improve Outcome: Progressing   Problem: Nutrition: Goal: Adequate nutrition will be maintained Outcome: Progressing   Problem: Coping: Goal: Level of anxiety will decrease Outcome: Progressing

## 2020-05-01 NOTE — Progress Notes (Signed)
PT Cancellation Note  Patient Details Name: Wanda Ramirez MRN: [id number] DOB: 23-Jan-1971   Cancelled Treatment:    Reason Eval/Treat Not Completed: Other (comment) (pt with anxiety this AM. SpO2 96% on 28% FiO2 with HR 122-146 at rest with pt with Increased RR and dyspnea. Will hold with pt and RN in agreement)   [place] 05/01/2020, 8:10 AM  Bayard Males, PT Acute Rehabilitation Services Pager: [phone] Office: [phone]

## 2020-05-01 NOTE — NC FL2 (Signed)
[place] MEDICAID FL2 LEVEL OF CARE SCREENING TOOL     IDENTIFICATION  Patient Name: Wanda Ramirez Birthdate: 07/13/1970 Sex: female Admission Date (Current Location): 02/13/2020  [facility] and [place] Number:  Herbalist and Address:  [facility], [place] [address] [place], [place] [address]      Provider Number: [id number]  Attending Physician Name and Address:  Oda Kilts, MD  Relative Name and Phone Number:  Markan Cazarez [phone]    Current Level of Care: Hospital Recommended Level of Care: [place] Prior Approval Number:    Date Approved/Denied:   PASRR Number:    Discharge Plan: SNF    Current Diagnoses: Patient Active Problem List   Diagnosis Date Noted  . Sacral wound   . Palliative care encounter   . Septic shock ([place])   . Cellulitis of buttock   . Fever   . Status post tracheostomy ([place])   . Aspiration into airway   . Pressure injury of skin 03/07/2020  . ARDS (adult respiratory distress syndrome) ([place])   . Acute respiratory failure with hypoxemia ([place])   . Encephalopathy acute   . HCAP (healthcare-associated pneumonia)   . COVID-19 02/14/2020  . Weakness 03/17/2016  . Hypotension 03/17/2016  . Leukocytosis 03/17/2016  . Hyperlipidemia 03/17/2016  . Peripheral neuropathy 03/17/2016  . Precordial chest pain 03/17/2016  . End stage renal disease on dialysis ([place]) 03/16/2016  . Diabetes mellitus type 2 in obese ([place]) 03/16/2016  . Essential hypertension 03/16/2016  . Hypothyroidism 03/16/2016  . Glaucoma 03/16/2016  . GERD (gastroesophageal reflux disease) 03/16/2016  . OSA (obstructive sleep apnea) 03/16/2016    Orientation RESPIRATION BLADDER Height & Weight     Self, Time, Situation, Place (per documentation)  O2, Tracheostomy (Trach collar 5L/ 28%) External catheter Weight: 105 kg Height:  _0  (157.5 cm)  BEHAVIORAL SYMPTOMS/MOOD NEUROLOGICAL BOWEL NUTRITION STATUS     (n/a)  Incontinent (flexi seal) Diet (Dysphagia 2 fine chopped thin liquids)  AMBULATORY STATUS COMMUNICATION OF NEEDS Skin   Total Care Non-Verbally (verbally when passey muir  speaking valve is on) PU Stage and Appropriate Care, Wound Vac (pressure injury to mid sacrum unstageable, right neck shallow open injury with red pink wound bed without sloughing beneath right side of trach)                       Personal Care Assistance Level of Assistance  Bathing, Feeding, Dressing, Total care Bathing Assistance: Maximum assistance Feeding assistance: Maximum assistance (full supervison) Dressing Assistance: Maximum assistance Total Care Assistance: Maximum assistance   Functional Limitations Info  Sight, Speech, Hearing Sight Info: Impaired Hearing Info: Adequate Speech Info: Impaired (trach/ passey muir speaking valve)    SPECIAL CARE FACTORS FREQUENCY  PT (By licensed PT), OT (By licensed OT)     PT Frequency: 5X OT Frequency: 5X            Contractures Contractures Info: Not present    Additional Factors Info  Code Status, Allergies, Psychotropic, Insulin Sliding Scale, Isolation Precautions, Suctioning Needs Code Status Info: DNR Allergies Info: Hydrocodone Psychotropic Info: None Insulin Sliding Scale Info: See discharge summary for sliding scale info Isolation Precautions Info: Contact precautions for ESBL Suctioning Needs: As needed   Current Medications (05/01/2020):  This is the current hospital active medication list Current Facility-Administered Medications  Medication Dose Route Frequency Provider Last Rate Last Admin  . 0.9 %  sodium chloride infusion (Manually  program via [place])   Intravenous Once Candee Furbish, MD   Stopped at 04/16/20 1801  . 0.9 %  sodium chloride infusion   Intravenous PRN Spero Geralds, MD 10 mL/hr at 04/08/20 2040 250 mL at 04/08/20 2040  . acetaminophen (TYLENOL) 160 MG/5ML solution 650 mg  650 mg Per Tube Q6H PRN  Brand Males, MD   650 mg at 04/28/20 2044  . albuterol (PROVENTIL) (2.5 MG/3ML) 0.083% nebulizer solution 2.5 mg  2.5 mg Nebulization Q3H PRN Anders Simmonds, MD   2.5 mg at 05/01/20 0829  . B-complex with vitamin C tablet 1 tablet  1 tablet Per Tube Daily Brand Males, MD   1 tablet at 05/01/20 0805  . camphor-menthol (SARNA) lotion   Topical PRN Dellinger, Bobby Rumpf, PA-C   Given at 04/28/20 2247  . chlorhexidine gluconate (MEDLINE KIT) (PERIDEX) 0.12 % solution 15 mL  15 mL Mouth Rinse BID Brand Males, MD   15 mL at 05/01/20 0806  . Chlorhexidine Gluconate Cloth 2 % PADS 6 each  6 each Topical Daily Candee Furbish, MD   6 each at 05/01/20 [phone]  . clonazePAM (KLONOPIN) disintegrating tablet 0.25 mg  0.25 mg Per Tube Daily Darrick Meigs, Rylee, MD   0.25 mg at 05/01/20 0805  . collagenase (SANTYL) ointment   Topical Daily Norm Parcel, [place]   Given at 04/30/20 0815  . [START ON 05/03/2020] Darbepoetin Alfa (ARANESP) injection 150 mcg  150 mcg Intravenous Q Thu-HD Roney Jaffe, MD      . feeding supplement (NEPRO CARB STEADY) liquid 237 mL  237 mL Oral BID BM Oda Kilts, MD   237 mL at 05/01/20 0806  . feeding supplement (NEPRO CARB STEADY) liquid 840 mL  840 mL Per Tube Q24H Oda Kilts, MD 70 mL/hr at 05/01/20 0300 Rate Verify at 05/01/20 0300  . feeding supplement (PROSource TF) liquid 45 mL  45 mL Per Tube BID Oda Kilts, MD   45 mL at 05/01/20 0805  . fentaNYL (SUBLIMAZE) injection 100-200 mcg  100-200 mcg Intravenous Q2H PRN Dellinger, Marianne L, PA-C   100 mcg at 04/30/20 1323  . free water 20 mL  20 mL Per Tube Q6H Aldine Contes, MD   20 mL at 05/01/20 0044  . heparin injection 5,000 Units  5,000 Units Subcutaneous Q8H Christian, Rylee, MD   5,000 Units at 04/30/20 2100  . hydrocerin (EUCERIN) cream   Topical Daily Spero Geralds, MD   Given at 04/30/20 2103  . influenza vac split quadrivalent PF (FLUARIX) injection 0.5 mL  0.5 mL  Intramuscular Prior to discharge Axel Filler, MD      . insulin aspart (novoLOG) injection 0-20 Units  0-20 Units Subcutaneous Q4H Mitzi Hansen, MD   4 Units at 05/01/20 0959  . insulin glargine (LANTUS) injection 30 Units  30 Units Subcutaneous Daily Mitzi Hansen, MD   30 Units at 04/30/20 2058  . levothyroxine (SYNTHROID) tablet 125 mcg  125 mcg Per Tube Q0600 Donnamae Jude, RPH   125 mcg at 05/01/20 2505  . lip balm (CARMEX) ointment   Topical PRN Brand Males, MD   Given at 03/27/20 0536  . midodrine (PROAMATINE) tablet 10 mg  10 mg Per Tube TID WC Spero Geralds, MD   10 mg at 05/01/20 0805  . multivitamin (RENA-VIT) tablet 1 tablet  1 tablet Per Tube QHS Oda Kilts, MD   1 tablet at 04/30/20 2101  .  nutrition supplement (JUVEN) (JUVEN) powder packet 1 packet  1 packet Per Tube BID Aldine Contes, MD   1 packet at 05/01/20 0805  . nystatin (MYCOSTATIN) 100000 UNIT/ML suspension 500,000 Units  5 mL Oral QID Madelon Lips, MD   500,000 Units at 05/01/20 0805  . ondansetron (ZOFRAN) injection 4 mg  4 mg Intravenous Q6H PRN [name], NP   4 mg at 04/22/20 0001  . oxyCODONE (Oxy IR/ROXICODONE) immediate release tablet 10 mg  10 mg Per Tube Q6H Spero Geralds, MD   10 mg at 05/01/20 0521  . pantoprazole sodium (PROTONIX) 40 mg/20 mL oral suspension 40 mg  40 mg Per Tube Daily Mosetta Anis, MD   40 mg at 05/01/20 0805  . polyethylene glycol (MIRALAX / GLYCOLAX) packet 17 g  17 g Per Tube Daily PRN Iona Beard, MD   17 g at 04/22/20 2159  . QUEtiapine (SEROQUEL) tablet 50 mg  50 mg Per Tube QHS Christian, Rylee, MD      . silver nitrate applicators applicator 1 application  1 application Topical PRN Candee Furbish, MD      . sodium chloride flush (NS) 0.9 % injection 10-40 mL  10-40 mL Intracatheter Q12H Hoffman, Erik C, DO   10 mL at 05/01/20 0806  . sodium chloride flush (NS) 0.9 % injection 10-40 mL  10-40 mL Intracatheter PRN Joni Reining C, DO    10 mL at 03/31/20 1130  . Thrombi-Pad 3"X3" pad 1 each  1 each Topical Once Aldine Contes, MD         Discharge Medications: Please see discharge summary for a list of discharge medications.  Relevant Imaging Results:  Relevant Lab Results:   Additional Information SS# [id number] / Hemodialysis T/T/S/ /Trach collar 5L 28%/ wound vac  Angelita Ingles, RN

## 2020-05-01 NOTE — Progress Notes (Signed)
NAME:  Wanda Ramirez, MRN:  [id number], DOB:  September 30, 1970, LOS: 70 ADMISSION DATE:  02/13/2020, CONSULTATION DATE:  9/24 REFERRING MD:  Heber Phelps, CHIEF COMPLAINT:  Dyspnea   Brief History   49 yo female admitted with COVID 19 pneumonia on 9/13, had been diagnosed with COVID on 9/9.  On 9/24 her condition worsened and PCCM was consulted, she was sent to the ICU and treated with BIPAP, then eventually intubated.  Started on CRRT through a tunneled HD cath. Extubated on 10/6 but re-intubated on 10/8. Now sp trach tolerating trach collar.   Past Medical History  Sleep apnea Morbid obesity Hypothyroidism Hypertension GERD  End-stage renal disease on HD MWF Diabetes Asthma  Significant Hospital Events   9/14 admit 9/25 on BiPAP with Precedex overnight 9/26 -unable to run CRRT with prone ventilation.  Resume supine ventilation but still unable to draw blood back. 9/27 transferred to 84m. IR replaced HD catheter 10/1 on CRRT and vasopressors 10/6 - extubated 10/7 - had some stridor and increased WOB so placed on BIPAP and dexamethasone given.  10/15 weaning sedation. midaz off 10/16 move to 43M 10/18 s/p 6 proximal XLT Shiley tracheostomy  10/19: Resumed Eliquis low dose. Per Dr. Thompson Caul note 10/7 will need to be on [facility] for 1 month then reasses 10/19: Episode of hypoglycemia 55. 10/21 hypoglycemic again. levemir and tubefeed coverage decreased. Low grade temp->sputum culture send had vomiting event on 10/19 w/ concern for aspiration. Fent was stopped 10/20. Still gets anxious at night. Required increased precedex. Added clonazepam. 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 Surgery placed stitch. 11/15 hypotensive. Transferred back on ICU for pressor support 11/16 Off pressors Consults:  Nephrology General Surgery Palliative Care Procedures:  9/23 tunneled HD cath placement >  10/8 ETT > 10/18 10/18 6 proximal XLT Shiley tracheostomy  > 11/2 Sacral wound debridement Significant Diagnostic Tests:  9/21 Vas ultrasound> thrombosed LUE fistula 9/21 VQ scan > negative for PE 9/29 echo> LVEF 60-65%, flat ventrcile consistent with RV pressure overload, RV severely enlarged, moderate reduction in RV function  Micro Data:  9/13 blood > neg   9/14 sars cov 2 > pos 9/26 blood > neg 10/8 resp > MSSA 10/8 blood > negative  10/21 sputum > Normal flora  10/25 Blood > ngtd x 3 days 10/25 Sputum  > few diphtheroids  10/25 Blood >>negative 10/29 Sputum >> Staph aureus 11/2 Blood>> negative 11/2 sputum >> negative 11/2 Wound >> E. Coli, enterococcus faecalis  11/15 Tracheal aspirate >> few gram negative rods, moderate gram variable rods Antimicrobials:  9/14 remdesivir > 9/19 9/15 tocilizumab > x1 9/20 cefepime > 9/29 9/20 azithro >  9/25 10/8 vanc > 10/9 10/8 zosyn > x1 10/10 ceftriaxone > 10/16 10/31 vancomycin >> 1/7 11/2 zosyn>>11/8 11/11 Merrem >>11/13 Interim history/subjective:  In bed no Passy-Muir valve no acute distress making appropriate sounds around her trach Objective   Blood pressure 140/85, pulse (!) 115, temperature 98.7 F (37.1 C), temperature source Axillary, resp. rate 16, height 5\' 2"  (1.575 m), weight 105 kg, SpO2 98 %.  FiO2 (%):  [25 %-28 %] 28 %   Intake/Output Summary (Last 24 hours) at 05/01/2020 1136 Last data filed at 05/01/2020 0800 Gross per 24 hour  Intake 2139.17 ml  Output 400 ml  Net 1739.17 ml   Filed Weights   04/23/20 0718 04/23/20 1054 04/26/20 0417  Weight: 106.2 kg 104.2 kg 105 kg     Physical Exam: General: Morbid obese female sitting in bed no acute distress HEENT: #6 cuffless proximal XLT trach is in place.  She is making burping sounds around the tracheostomy at this time.  With no desaturations. Neuro: Grossly intact without focal defect CV: Heart sounds are distant PULM: Decreased air movement. GI: soft, bsx4 active, massively obese, massively abdomen Extremities: Left upper extremity AV fistula is in place.  Bilateral lower extremity edema Skin: Dry    Resolved Hospital Problem list   Thrombocytopenia  MSSA PNA  Nausea  Agitated delirium Shock  Assessment & Plan:     Acute hypoxic respiratory failure from COVID 19 pneumonia with ARDS complicated by MSSA PNA. Failure to wean s/p tracheostomy Suspected OSA/OHS Tracheostomy placed 10/18.  P:  Currently tolerating trach collar without problem Continue #6 uncuffed proximal XLT. Unable to get arterial blood gas to determine if she needs nocturnal ventilation Continue to try to get arterial blood gas      Best practice:  Per primary  Labs:   CMP Latest Ref Rng & Units 05/01/2020 04/30/2020 04/29/2020  Glucose 70 - 99 mg/dL 173(H) 170(H) 87  BUN 6 - 20 mg/dL 127(H) 78(H) 53(H)  Creatinine 0.44 - 1.00 mg/dL 6.72(H) 5.19(H) 3.54(H)  Sodium 135 - 145 mmol/L 126(L) 130(L) 134(L)  Potassium 3.5 - 5.1 mmol/L 4.2 4.1 4.2  Chloride 98 - 111 mmol/L 86(L) 92(L) 95(L)  CO2 22 - 32 mmol/L 22 23 25   Calcium 8.9 - 10.3 mg/dL 10.4(H) 9.8 9.3  Total Protein 6.5 - 8.1 g/dL - - -  Total Bilirubin 0.3 - 1.2 mg/dL - - -  Alkaline Phos 38 - 126 U/L - - -  AST 15 - 41 U/L - - -  ALT 0 - 44 U/L - - -    CBC Latest Ref Rng & Units 05/01/2020 04/27/2020 04/25/2020  WBC 4.0 - 10.5 K/uL 10.7(H) 12.6(H) 11.7(H)  Hemoglobin 12.0 - 15.0 g/dL 8.2(L) 9.1(L) 7.9(L)  Hematocrit 36 - 46 % 25.0(L) 28.8(L) 24.7(L)  Platelets 150 - 400 K/uL 429(H) 416(H) 399    ABG    Component Value Date/Time   PHART 7.250 (L)  04/03/2020 1030   PCO2ART 64.8 (H) 04/03/2020 1030   PO2ART 156 (H) 04/03/2020 1030   HCO3 28.4 (H) 04/03/2020 1030   TCO2 30 04/03/2020 1030   ACIDBASEDEF 2.0 04/03/2020 0907   O2SAT 99.0 04/03/2020 1030    CBG (last 3)  Recent Labs    05/01/20 0438 05/01/20 0843 05/01/20 [place] ACNP Acute Care Nurse Practitioner [place] Please consult Amion 05/01/2020, 11:36 AM

## 2020-05-01 NOTE — Progress Notes (Signed)
RT unsuccessful at obtain ABG x 2 attempts on right radial.  Left radial cannot be used due to limb restriction. RN notified.

## 2020-05-02 LAB — BLOOD GAS, ARTERIAL
Acid-Base Excess: 2.3 mmol/L — ABNORMAL HIGH (ref 0.0–2.0)
Bicarbonate: 26.4 mmol/L (ref 20.0–28.0)
FIO2: 28
O2 Saturation: 98.8 %
Patient temperature: 36.9
pCO2 arterial: 41.4 mmHg (ref 32.0–48.0)
pH, Arterial: 7.421 (ref 7.350–7.450)
pO2, Arterial: 113 mmHg — ABNORMAL HIGH (ref 83.0–108.0)

## 2020-05-02 LAB — CBC
HCT: 24.2 % — ABNORMAL LOW (ref 36.0–46.0)
Hemoglobin: 7.7 g/dL — ABNORMAL LOW (ref 12.0–15.0)
MCH: 27.6 pg (ref 26.0–34.0)
MCHC: 31.8 g/dL (ref 30.0–36.0)
MCV: 86.7 fL (ref 80.0–100.0)
Platelets: 397 10*3/uL (ref 150–400)
RBC: 2.79 MIL/uL — ABNORMAL LOW (ref 3.87–5.11)
RDW: 14.2 % (ref 11.5–15.5)
WBC: 9.6 10*3/uL (ref 4.0–10.5)
nRBC: 0 % (ref 0.0–0.2)

## 2020-05-02 LAB — RENAL FUNCTION PANEL
Albumin: 1.8 g/dL — ABNORMAL LOW (ref 3.5–5.0)
Anion gap: 14 (ref 5–15)
BUN: 53 mg/dL — ABNORMAL HIGH (ref 6–20)
CO2: 25 mmol/L (ref 22–32)
Calcium: 9 mg/dL (ref 8.9–10.3)
Chloride: 93 mmol/L — ABNORMAL LOW (ref 98–111)
Creatinine, Ser: 3.39 mg/dL — ABNORMAL HIGH (ref 0.44–1.00)
GFR, Estimated: 16 mL/min — ABNORMAL LOW (ref >60)
Glucose, Bld: 142 mg/dL — ABNORMAL HIGH (ref 70–99)
Phosphorus: 2.8 mg/dL (ref 2.5–4.6)
Potassium: 3.4 mmol/L — ABNORMAL LOW (ref 3.5–5.1)
Sodium: 132 mmol/L — ABNORMAL LOW (ref 135–145)

## 2020-05-02 LAB — GLUCOSE, CAPILLARY
Glucose-Capillary: 120 mg/dL — ABNORMAL HIGH (ref 70–99)
Glucose-Capillary: 158 mg/dL — ABNORMAL HIGH (ref 70–99)
Glucose-Capillary: 100 mg/dL — ABNORMAL HIGH (ref 70–99)
Glucose-Capillary: 161 mg/dL — ABNORMAL HIGH (ref 70–99)
Glucose-Capillary: 132 mg/dL — ABNORMAL HIGH (ref 70–99)
Glucose-Capillary: 138 mg/dL — ABNORMAL HIGH (ref 70–99)

## 2020-05-02 MED ORDER — NEPRO/CARBSTEADY PO LIQD
1000.0000 mL | ORAL | Status: DC
  Administered 2020-05-02: 1000 mL
  Filled 2020-05-02 (×4): qty 1000

## 2020-05-02 MED ORDER — PROSOURCE TF PO LIQD
45.0000 mL | Freq: Three times a day (TID) | ORAL | Status: DC
  Administered 2020-05-02 – 2020-05-04 (×6): 45 mL
  Filled 2020-05-02 (×6): qty 45

## 2020-05-02 NOTE — Plan of Care (Signed)
°  Problem: Education: Goal: Knowledge of risk factors and measures for prevention of condition will improve Outcome: Progressing   Problem: Coping: Goal: Psychosocial and spiritual needs will be supported Outcome: Progressing   Problem: Respiratory: Goal: Will maintain a patent airway Outcome: Progressing   Problem: Education: Goal: Knowledge of General Education information will improve Description: Including pain rating scale, medication(s)/side effects and non-pharmacologic comfort measures Outcome: Progressing   Problem: Clinical Measurements: Goal: Ability to maintain clinical measurements within normal limits will improve Outcome: Progressing Goal: Will remain free from infection Outcome: Progressing Goal: Diagnostic test results will improve Outcome: Progressing Goal: Respiratory complications will improve Outcome: Progressing Goal: Cardiovascular complication will be avoided Outcome: Progressing   Problem: Activity: Goal: Risk for activity intolerance will decrease Outcome: Progressing   Problem: Coping: Goal: Level of anxiety will decrease Outcome: Progressing   Problem: Elimination: Goal: Will not experience complications related to bowel motility Outcome: Progressing   Problem: Pain Managment: Goal: General experience of comfort will improve Outcome: Progressing   Problem: Safety: Goal: Ability to remain free from injury will improve Outcome: Progressing   Problem: Respiratory: Goal: Ability to maintain a clear airway and adequate ventilation will improve Outcome: Progressing   Problem: Role Relationship: Goal: Method of communication will improve Outcome: Progressing

## 2020-05-02 NOTE — Plan of Care (Signed)
  Problem: Respiratory: Goal: Will maintain a patent airway Outcome: Progressing   Problem: Clinical Measurements: Goal: Respiratory complications will improve Outcome: Progressing   Problem: Clinical Measurements: Goal: Cardiovascular complication will be avoided Outcome: Progressing

## 2020-05-02 NOTE — Progress Notes (Signed)
[facility] Progress Note  Subjective: pt seen in room  Vitals:   05/02/20 0825 05/02/20 0936 05/02/20 1142 05/02/20 1242  BP:  126/88  (!) 153/90  Pulse: 100 98 99 100  Resp: _0 Temp:  98 F (36.7 C)  98 F (36.7 C)  TempSrc:  Oral  Oral  SpO2:      Weight:      Height:        Exam: OP HD:TTS  4h 50mn 450/800 2/2.25 bath 111.5kg Hep 2000 L AVF - darbe 50 ug q week, last 9/7 - calc 1.0 tiw - 9/9 Hb 10.0, tsat 22%  49yo female w/ ESRD on TTS HD admitted for COVID PNA on 02/13/20. She was intubated. SP CRRT around 9/26- 10/7. Now is on intermittent HD MWF here. She is sp trach. She had MSSA pna. DM2.Then she had large sacral decub requiring I&D.    Assessment/ Plan: 1. COVID pna sp trach complicated by MSSA PNA - off vent on trach collar 2. Hypotension - BP's stable on midodrine 10 tid 3. Stage IV sacral decub:- sp I&D x 2. SP abx. Getting wound care w/ BID saline rinses, pain control per pmd. 4. ESRD - HD here on TTS schedule. SP CRRT. HD tomorrow on schedule.  5. Volume - midodrine TID. Ave UF 2-2 .5 L as tol.  4. Anemia ckd/ critical illness - on darbe 150 weekly, 11/12 Ferritin >4000, iron sat 11. With active infection and ^^ ferritin no IV iron. tx prbc's PRN.  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 HD access: LUA AVF clotted here during acute covid. TDC x2 per IR 7. Nutrition - getting NG Nepro tube feeds.  FEES with SLP 11/23.  Dysphagia 2, thin liquids, wearing PMV with it.   8. DM2 - per pmd 9. Thrush: Nystatin QID 10. Dispo: would need LTACH/ [facility] unless trach can be dc'd.      Rob Jaylene Schrom 05/02/2020, 2:12 PM   Recent Labs  Lab 05/01/20 0558 05/02/20 0148 05/02/20 0628  K 4.2 3.4*  --   BUN 127* 53*  --   CREATININE 6.72* 3.39*  --   CALCIUM 10.4* 9.0  --   PHOS 5.2* 2.8  --   HGB 8.2*  --  7.7*   Inpatient  medications: . sodium chloride   Intravenous Once  . B-complex with vitamin C  1 tablet Per Tube Daily  . chlorhexidine gluconate (MEDLINE KIT)  15 mL Mouth Rinse BID  . Chlorhexidine Gluconate Cloth  6 each Topical Daily  . clonazepam  0.25 mg Per Tube Daily  . collagenase   Topical Daily  . [START ON 05/03/2020] darbepoetin (ARANESP) injection - DIALYSIS  150 mcg Intravenous Q Thu-HD  . feeding supplement (PROSource TF)  45 mL Per Tube TID  . free water  20 mL Per Tube Q6H  . heparin injection (subcutaneous)  5,000 Units Subcutaneous Q8H  . hydrocerin   Topical Daily  . insulin aspart  0-20 Units Subcutaneous Q4H  . insulin glargine  30 Units Subcutaneous Daily  . levothyroxine  125 mcg Per Tube Q0600  . midodrine  10 mg Per Tube TID WC  . multivitamin  1 tablet Per Tube QHS  . nutrition supplement (JUVEN)  1 packet Per Tube BID  . nystatin  5 mL Mouth/Throat QID  . oxyCODONE  10 mg Per Tube Q6H  . pantoprazole sodium  40 mg Per Tube Daily  . QUEtiapine  50 mg Per Tube QHS  . sodium chloride flush  10-40 mL Intracatheter Q12H  . Thrombi-Pad  1 each Topical Once   . sodium chloride 250 mL (04/08/20 2040)  . feeding supplement (NEPRO CARB STEADY) 1,000 mL (05/02/20 1315)   sodium chloride, acetaminophen (TYLENOL) oral liquid 160 mg/5 mL, albuterol, camphor-menthol, fentaNYL (SUBLIMAZE) injection, influenza vac split quadrivalent PF, lip balm, ondansetron (ZOFRAN) IV, polyethylene glycol, silver nitrate applicators, sodium chloride flush

## 2020-05-02 NOTE — Progress Notes (Signed)
  Speech Language Pathology Treatment: Dysphagia;Passy Muir Speaking valve  Patient Details Name: Wanda Ramirez MRN: [id number] DOB: [birth year] Today's Date: 05/02/2020 Time: 1436-1500 SLP Time Calculation (min) (ACUTE ONLY): 24 min  Assessment / Plan / Recommendation Clinical Impression  Wanda Ramirez is much more alert and has sustained throughout the day per nurse. She is also communicating with speaking valve (therapist placed purple valve and attached leash) in an audible, mildly hoarse vocal quality.  Educated pt to wear with meals and not wear while sleeping.  Pt observed with regular texture to determine safety to upgrade. She manipulated, masticated and propelled without delays or residue. No s/s aspiration with thin or solid, therefore texture upgraded to regular. Hopeful her intake will increase for removal of Cortrak.    HPI HPI: 49 year old female with PMH of with end-stage renal disease on hemodialysis and chronic lung disease on chronic supplemental oxygen at 3 L/min. Admitted 9/13 with acute on chronic hypoxic respiratory failure due to COVID-19 pneumonia. Intubated 9/24-10/6, reintubated 10/8, CRRT 9/26-10/7, trach and bronch 10/18, trach collar 11/8. PMV trials began 10/27 with minimal toleration, SLP discharged 11/3 due to medical decline. Reconsulted 11/10. Pt currently has cuffed Shiley 6      SLP Plan  Continue with current plan of care       Recommendations  Diet recommendations: Regular;Thin liquid Liquids provided via: Straw;Cup Medication Administration: Whole meds with puree Supervision: Patient able to self feed;Full supervision/cueing for compensatory strategies Compensations: Slow rate;Small sips/bites Postural Changes and/or Swallow Maneuvers:  (upright position; reverse trendelenburg position)      Patient may use Passy-Muir Speech Valve: During all therapies with supervision;During PO intake/meals PMSV Supervision: Full         Oral Care  Recommendations: Oral care BID Follow up Recommendations: LTACH;Skilled Nursing facility SLP Visit Diagnosis: Dysphagia, unspecified (R13.10);Aphonia (R49.1) Plan: Continue with current plan of care                       Wanda Ramirez 05/02/2020, 3:21 PM  Wanda [email or web address].Ed Risk analyst [phone] Office [phone]

## 2020-05-02 NOTE — Progress Notes (Signed)
NAME:  Wanda Ramirez, MRN:  [id number], DOB:  May 26, 1971, LOS: 81 ADMISSION DATE:  02/13/2020, CONSULTATION DATE:  9/24 REFERRING MD:  Heber Lathrop, CHIEF COMPLAINT:  Dyspnea   Brief History   49 yo female admitted with COVID 19 pneumonia on 9/13, had been diagnosed with COVID on 9/9.  On 9/24 her condition worsened and PCCM was consulted, she was sent to the ICU and treated with BIPAP, then eventually intubated.  Started on CRRT through a tunneled HD cath. Extubated on 10/6 but re-intubated on 10/8. Now sp trach tolerating trach collar.   Past Medical History  Sleep apnea Morbid obesity Hypothyroidism Hypertension GERD  End-stage renal disease on HD MWF Diabetes Asthma  Significant Hospital Events   9/14 admit 9/25 on BiPAP with Precedex overnight 9/26 -unable to run CRRT with prone ventilation.  Resume supine ventilation but still unable to draw blood back. 9/27 transferred to 26m. IR replaced HD catheter 10/1 on CRRT and vasopressors 10/6 - extubated 10/7 - had some stridor and increased WOB so placed on BIPAP and dexamethasone given.  10/15 weaning sedation. midaz off 10/16 move to 29M 10/18 s/p 6 proximal XLT Shiley tracheostomy  10/19: Resumed Eliquis low dose. Per Dr. Thompson Caul note 10/7 will need to be on [facility] for 1 month then reasses 10/19: Episode of hypoglycemia 55. 10/21 hypoglycemic again. levemir and tubefeed coverage decreased. Low grade temp->sputum culture send had vomiting event on 10/19 w/ concern for aspiration. Fent was stopped 10/20. Still gets anxious at night. Required increased precedex. Added clonazepam. 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 Surgery placed stitch. 11/15 hypotensive. Transferred back on ICU for pressor support 11/16 Off pressors Consults:  Nephrology General Surgery Palliative Care Procedures:  9/23 tunneled HD cath placement >  10/8 ETT > 10/18 10/18 6 proximal XLT Shiley tracheostomy  > 11/2 Sacral wound debridement Significant Diagnostic Tests:  9/21 Vas ultrasound> thrombosed LUE fistula 9/21 VQ scan > negative for PE 9/29 echo> LVEF 60-65%, flat ventrcile consistent with RV pressure overload, RV severely enlarged, moderate reduction in RV function  Micro Data:  9/13 blood > neg   9/14 sars cov 2 > pos 9/26 blood > neg 10/8 resp > MSSA 10/8 blood > negative  10/21 sputum > Normal flora  10/25 Blood > ngtd x 3 days 10/25 Sputum  > few diphtheroids  10/25 Blood >>negative 10/29 Sputum >> Staph aureus 11/2 Blood>> negative 11/2 sputum >> negative 11/2 Wound >> E. Coli, enterococcus faecalis  11/15 Tracheal aspirate >> few gram negative rods, moderate gram variable rods Antimicrobials:  9/14 remdesivir > 9/19 9/15 tocilizumab > x1 9/20 cefepime > 9/29 9/20 azithro >  9/25 10/8 vanc > 10/9 10/8 zosyn > x1 10/10 ceftriaxone > 10/16 10/31 vancomycin >> 1/7 11/2 zosyn>>11/8 11/11 Merrem >>11/13 Interim history/subjective:   No new issues Slept well, no pain Says she is able to clear her secretions  Objective   Blood pressure 117/69, pulse 100, temperature 98.3 F (36.8 C), temperature source Oral, resp. rate 16, height 5\' 2"  (1.575 m), weight 104.9 kg, SpO2 99 %.  FiO2 (%):  [28 %] 28 %   Intake/Output Summary (Last 24 hours) at 05/02/2020 0933 Last data filed at 05/02/2020 0532 Gross per 24 hour  Intake --  Output 1900 ml  Net -1900 ml   Filed Weights   04/26/20 0417 05/02/20 0459  Weight: 105 kg 104.9 kg    Physical Exam: General: Morbid obese female  sitting in bed no acute distress HEENT: #6 cuffless proximal XLT trach is in place.  She is making burping sounds around the tracheostomy at this time.  With no desaturations. Neuro: Grossly intact without focal defect CV: Heart sounds are distant PULM: Decreased air movement. GI: soft, bsx4 active, massively obese, massively abdomen Extremities: Left upper extremity AV fistula is in place.  Bilateral lower extremity edema Skin: Dry    Resolved Hospital Problem list   Thrombocytopenia  MSSA PNA  Nausea  Agitated delirium Shock  Assessment & Plan:     Acute hypoxic respiratory failure from COVID 19 pneumonia with ARDS complicated by MSSA PNA. Failure to wean s/p tracheostomy Suspected OSA/OHS Tracheostomy placed 10/18.  P: Continue ATC, has tolerated well Continue #6 uncuffed proximal XLT. No plan to downsize today, consider next week depending on sectretions and suction frequency.  Her ABG 12/1 was normal >> she does not need nocturnal ventilation. Needs to sleep with trach uncapped. If she is ultimately decannulated then she would need another ABG, formal sleep eval to determine BiPAP need.     Best practice:  Per primary  Labs:   CMP Latest Ref Rng & Units 05/02/2020 05/01/2020 04/30/2020  Glucose 70 - 99 mg/dL 142(H) 173(H) 170(H)  BUN 6 - 20 mg/dL 53(H) 127(H) 78(H)  Creatinine 0.44 - 1.00 mg/dL 3.39(H) 6.72(H) 5.19(H)  Sodium 135 - 145 mmol/L 132(L) 126(L) 130(L)  Potassium 3.5 - 5.1 mmol/L 3.4(L) 4.2 4.1  Chloride 98 - 111 mmol/L 93(L) 86(L) 92(L)  CO2 22 - 32 mmol/L 25 22 23   Calcium 8.9 - 10.3 mg/dL 9.0 10.4(H) 9.8  Total Protein 6.5 - 8.1 g/dL - - -  Total Bilirubin 0.3 - 1.2 mg/dL - - -  Alkaline Phos 38 - 126 U/L - - -  AST 15 - 41 U/L - - -  ALT 0 - 44 U/L - - -    CBC Latest Ref Rng & Units 05/02/2020 05/01/2020 04/27/2020  WBC 4.0 - 10.5 K/uL 9.6 10.7(H) 12.6(H)  Hemoglobin 12.0 - 15.0 g/dL 7.7(L) 8.2(L) 9.1(L)  Hematocrit 36 - 46 % 24.2(L) 25.0(L)  28.8(L)  Platelets 150 - 400 K/uL 397 429(H) 416(H)    ABG    Component Value Date/Time   PHART 7.421 05/02/2020 0511   PCO2ART 41.4 05/02/2020 0511   PO2ART 113 (H) 05/02/2020 0511   HCO3 26.4 05/02/2020 0511   TCO2 30 04/03/2020 1030   ACIDBASEDEF 2.0 04/03/2020 0907   O2SAT 98.8 05/02/2020 0511    CBG (last 3)  Recent Labs    05/01/20 1915 05/01/20 2256 05/02/20 0821  GLUCAP 92 Myrtle    Baltazar Apo, MD, PhD 05/02/2020, 9:37 AM [facility] [phone] or if no answer [phone]

## 2020-05-02 NOTE — Progress Notes (Signed)
HD#78 Subjective:  Overnight Events: none   Patient resting comfortably in bed. She denies any new complaints today.  Objective:  Vital signs in last 24 hours: Vitals:   05/02/20 0825 05/02/20 0936 05/02/20 1142 05/02/20 1242  BP:  126/88  (!) 153/90  Pulse: 100 98 99 100  Resp: 16 19 19 18   Temp:  98 F (36.7 C)  98 F (36.7 C)  TempSrc:  Oral  Oral  SpO2:      Weight:      Height:       Supplemental O2: Trach collar SpO2: 99 % O2 Flow Rate (L/min): 5 L/min FiO2 (%): 28 %   Physical Exam:  Physical Exam Constitutional:      General: She is not in acute distress.    Appearance: She is ill-appearing.  HENT:     Head: Normocephalic and atraumatic.  Cardiovascular:     Rate and Rhythm: Normal rate and regular rhythm.     Pulses: Normal pulses.     Heart sounds: Normal heart sounds.  Pulmonary:     Effort: Pulmonary effort is normal. No respiratory distress.  Abdominal:     General: There is no distension.     Palpations: Abdomen is soft.  Musculoskeletal:     Cervical back: Normal range of motion.  Skin:    Comments: Surgical wound on sacrum with purulent discharge and sinus tract (see image in chart)  Neurological:     Mental Status: She is alert.   Media Information   Document Information  Photos    05/02/2020 10:31  Attached To:  Hospital Encounter on 02/13/20  Source Information  Wanda Groves, DO  [facility]   Media Information   Document Information  Photos    05/02/2020 10:31  Attached To:  Hospital Encounter on 02/13/20  Source Information  Wanda Ramirez  [facility]     Filed Weights   04/26/20 0417 05/02/20 0459  Weight: 105 kg 104.9 kg     Intake/Output Summary (Last 24 hours) at 05/02/2020 1251 Last data filed at 05/02/2020 0532 Gross per 24 hour  Intake --  Output 1900 ml  Net -1900 ml   Net IO Since Admission: 23,058.23 mL [05/02/20 1251]  Pertinent Labs: CBC Latest Ref Rng &  Units 05/02/2020 05/01/2020 04/27/2020  WBC 4.0 - 10.5 K/uL 9.6 10.7(H) 12.6(H)  Hemoglobin 12.0 - 15.0 g/dL 7.7(L) 8.2(L) 9.1(L)  Hematocrit 36 - 46 % 24.2(L) 25.0(L) 28.8(L)  Platelets 150 - 400 K/uL 397 429(H) 416(H)    CMP Latest Ref Rng & Units 05/02/2020 05/01/2020 04/30/2020  Glucose 70 - 99 mg/dL 142(H) 173(H) 170(H)  BUN 6 - 20 mg/dL 53(H) 127(H) 78(H)  Creatinine 0.44 - 1.00 mg/dL 3.39(H) 6.72(H) 5.19(H)  Sodium 135 - 145 mmol/L 132(L) 126(L) 130(L)  Potassium 3.5 - 5.1 mmol/L 3.4(L) 4.2 4.1  Chloride 98 - 111 mmol/L 93(L) 86(L) 92(L)  CO2 22 - 32 mmol/L 25 22 23   Calcium 8.9 - 10.3 mg/dL 9.0 10.4(H) 9.8  Total Protein 6.5 - 8.1 g/dL - - -  Total Bilirubin 0.3 - 1.2 mg/dL - - -  Alkaline Phos 38 - 126 U/L - - -  AST 15 - 41 U/L - - -  ALT 0 - 44 U/L - - -    Imaging: No results found.  Assessment/Plan:   Principal Problem:   COVID-19 Active Problems:   End stage renal disease on dialysis [facility])   Diabetes  mellitus type 2 in obese (HCC)   OSA (obstructive sleep apnea)   HCAP (healthcare-associated pneumonia)   Acute respiratory failure with hypoxemia (HCC)   Encephalopathy acute   ARDS (adult respiratory distress syndrome) (HCC)   Pressure injury of skin   Aspiration into airway   Status post tracheostomy ([place])   Fever   Septic shock (HCC)   Cellulitis of buttock   Palliative care encounter   Sacral wound   Patient Summary: Wanda Ramirez is a 49 y.o. with a pertinent PMH of OSA, morbid obesity, hypothyroidism, hypertension, GERD, ESRD, diabetes, asthma, who presented with shortness of breath and admitted for Covid pneumonia complicated by prolonged hospital stay including subsequent MSSA infection and necrotizing soft tissue infection..   Chronic hypoxic respiratory failure s/p tracheostomy secondary to MIWOE-32 pneumonia complicated by MSSA pneumonia: Patient on trach collar tolerating well with 5 L flow.  She is able to communicate better with valve in  place. 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 Wound care will remove the wound vac and start BID saline rinses and Wanda Ramirez will reevaluate the wound later this week. Patient denies any systemic signs of infection. Therefore, we will hold off on antibiotic therapy at this time.  -Continue BID dressing changes -Continue pain management with oxycodone 10 mg every 6, as needed fentanyl 100 mcg with dressing changes.  ESRD Patient has tunneled cath in place for HD -Continue HD per nephrology -Continue midodrine 10 mg 3 times daily with HD -Continue Aranesp per nephrology   Depression/Anxiety: -Continue Seroquel nightly and Klonopin daily  Type 2 diabetes: -Continue Lantus 30 units daily and sliding scale insulin resistant  High risk for malnutrition: Continue calorie count per dietitian. Will assess patient's need for PEG tube if she cannot maintain appropriate nutrition through PO intake.   Diet: dysphagia 2 and tube feeds IVF: None,None VTE: Heparin Code: DNR PT/OT recs: Pending, none. TOC recs: pending improvement, but would need LTAC   Dispo: Anticipated discharge pending improvement.   Please contact the on call pager after 5 pm and on weekends at [phone].

## 2020-05-02 NOTE — Progress Notes (Addendum)
Calorie Count Note  48 hour calorie count ordered.  Diet: DYS 2, thin liquids  Supplements: Nepro Shake BID  Despite discussion with nursing yesterday expressing the importance of proper meal documentation, no tickets in calorie count envelope today either. Over the course of the 48-hour calorie count, there was one meal completion charted for 11/29 breakfast at 50% (407 kcal and 20 g protein). No other meal completion documentation available for 11/29 or for 11/30. Unsure of supplement intake given lack of documentation; however, noted to have refused supplement this morning.  Spoke with RN today who reports pt has had very poor intake, <25%. Given pt's poor intake, increased nutrient needs, and risk for malnutrition, recommend returning pt to 24 hour continuous tube feeding.    Intervention: -d/c calorie count -d/c Nepro shakes po; pt refusing -Return to 24 hour continuous TF via Cortrak: Nepro at 50 ml/hr (1200 ml/day) with 38mlProSource TF TID and 30ml free water Q6H   Tube feeding regimen at goal provides2280kcal, 130grams of protein, and 817ml of free water daily (940ml total with flushes)  -Also continue Juven BID via Cortrak for wound healing    Larkin Ina, MS, RD, LDN RD pager number and weekend/on-call pager number located in [place].

## 2020-05-02 NOTE — Consult Note (Signed)
[place] Nurse wound follow up Wound type: surgically debrided Stage 4 Pressure Injury Measurement: 17cm x 15cm x 4cm with tunneling at 6 oclock that is 9 cm deep and tunneling at 2 oclock that is 8cm  It is noted when I am coming down the hallway that there is an odor, thought maybe a patient on the unit had Cdiff.  Noted when I arrived to the room that the odor is coming from her room. Bedside nurse reports this odor is much worse and they have placed peppermint on the door to aid in lessoning the odor on the floor Wound KKD:PTELMRA clean, however the area that tunnels at 2 oclock has thick yellow drainage pooling despite the use of NPWT It is noted the VAC is alarming and the dressing is not sealed when I arrived to the room.  Drainage (amount, consistency, odor) yellow, thick, with strong odor Periwound: 2 small areas just distal to the bigger wound on the left lateral thigh, yellow, they do not probe to the larger wound Dressing procedure/placement/frequency: Removed NPWT, based on my assessment I have decided to remove the NPWT for now.  I have attempted to notify the ordering MD (CCS), however they have signed off on this patient. Unclear who is managing this patient's wound care at this time. VAC was ordered and CCS signed off.   Will have staff complete BID saline dressings for a few days and reassess appropriateness of wound care.   [place], [place], [place]

## 2020-05-02 NOTE — Progress Notes (Signed)
[facility] Progress Note  29 Days Post-Op  Subjective: CC:  CCS was asked to re-evaluate wound today by [place] RN due to increased odor and purulent drainage from tunnel located in 2 o'clock position.  Objective: Vital signs in last 24 hours: Temp:  [98 F (36.7 C)-98.7 F (37.1 C)] 98 F (36.7 C) (12/01 1242) Pulse Rate:  [95-113] 100 (12/01 1242) Resp:  [16-25] 18 (12/01 1242) BP: (109-153)/(59-100) 153/90 (12/01 1242) SpO2:  [97 %-100 %] 99 % (12/01 0445) FiO2 (%):  [28 %] 28 % (12/01 1242) Weight:  [104.9 kg] 104.9 kg (12/01 0459) Last BM Date: 05/01/20  Intake/Output from previous day: 11/30 0701 - 12/01 0700 In: 50 [NG/GT:50] Out: 1900 [Drains:400] Intake/Output this shift: No intake/output data recorded.  PE: Gen:  Alert, NAD, pleasant Pulm:  Normal effort Abd: Soft, non-tender, non-distended GU: wound granulating nicely and decreasin in size. In the 2'oclock position there is a roughly 3 x 3 x 12 cm tunnel that is draining fibrinopurulent exudate. probing of the wound did not reveal any undrained loculations or concern for undrained fluid collections      Psych: A&Ox3   Lab Results:  Recent Labs    05/01/20 0558 05/02/20 0628  WBC 10.7* 9.6  HGB 8.2* 7.7*  HCT 25.0* 24.2*  PLT 429* 397   BMET Recent Labs    05/01/20 0558 05/02/20 0148  NA 126* 132*  K 4.2 3.4*  CL 86* 93*  CO2 22 25  GLUCOSE 173* 142*  BUN 127* 53*  CREATININE 6.72* 3.39*  CALCIUM 10.4* 9.0   PT/INR No results for input(s): LABPROT, INR in the last 72 hours. CMP     Component Value Date/Time   NA 132 (L) 05/02/2020 0148   K 3.4 (L) 05/02/2020 0148   CL 93 (L) 05/02/2020 0148   CO2 25 05/02/2020 0148   GLUCOSE 142 (H) 05/02/2020 0148   BUN 53 (H) 05/02/2020 0148   CREATININE 3.39 (H) 05/02/2020 0148   CALCIUM 9.0 05/02/2020 0148   PROT 5.9 (L) 04/13/2020 0500   ALBUMIN 1.8 (L) 05/02/2020 0148   AST 25 04/13/2020 0500   ALT 25 04/13/2020 0500   ALKPHOS  316 (H) 04/13/2020 0500   BILITOT 0.8 04/13/2020 0500   GFRNONAA 16 (L) 05/02/2020 0148   GFRAA 33 (L) 03/05/2020 1703   Lipase  No results found for: LIPASE     Studies/Results: No results found.  Anti-infectives: Anti-infectives (From admission, onward)   Start     Dose/Rate Route Frequency Ordered Stop   04/12/20 1800  meropenem (MERREM) 500 mg in sodium chloride 0.9 % 100 mL IVPB  Status:  Discontinued        500 mg 200 mL/hr over 30 Minutes Intravenous Every 24 hours 04/12/20 1746 04/14/20 1426   04/06/20 1200  vancomycin (VANCOCIN) IVPB 1000 mg/200 mL premix        1,000 mg 200 mL/hr over 60 Minutes Intravenous Every Fri (Hemodialysis) 04/05/20 1529 04/06/20 1300   04/04/20 1200  vancomycin (VANCOCIN) IVPB 1000 mg/200 mL premix        1,000 mg 200 mL/hr over 60 Minutes Intravenous Every M-W-F (Hemodialysis) 04/04/20 1014 04/04/20 1423   04/03/20 0000  piperacillin-tazobactam (ZOSYN) IVPB 2.25 g  Status:  Discontinued        2.25 g 100 mL/hr over 30 Minutes Intravenous Every 8 hours 04/02/20 2253 04/09/20 1559   04/02/20 2345  vancomycin (VANCOCIN) IVPB 1000 mg/200 mL premix  1,000 mg 200 mL/hr over 60 Minutes Intravenous  Once 04/02/20 2251 04/03/20 0236   04/02/20 2251  vancomycin variable dose per unstable renal function (pharmacist dosing)  Status:  Discontinued         Does not apply See admin instructions 04/02/20 2251 04/07/20 1037   04/02/20 1200  ceFAZolin (ANCEF) IVPB 1 g/50 mL premix  Status:  Discontinued        1 g 100 mL/hr over 30 Minutes Intravenous Every 24 hours 04/02/20 1030 04/02/20 2251   04/01/20 1300  vancomycin (VANCOCIN) 2,000 mg in sodium chloride 0.9 % 500 mL IVPB  Status:  Discontinued        2,000 mg 250 mL/hr over 120 Minutes Intravenous  Once 04/01/20 1203 04/01/20 1203   04/01/20 1300  vancomycin (VANCOREADY) IVPB 2000 mg/400 mL        2,000 mg 200 mL/hr over 120 Minutes Intravenous Every 12 hours 04/01/20 1205 04/01/20 1715    04/01/20 1300  vancomycin (VANCOCIN) 2,000 mg in sodium chloride 0.9 % 500 mL IVPB  Status:  Discontinued        2,000 mg 250 mL/hr over 120 Minutes Intravenous  Once 04/01/20 1207 04/01/20 1208   04/01/20 1245  vancomycin (VANCOCIN) 2,000 mg in sodium chloride 0.9 % 500 mL IVPB  Status:  Discontinued        2,000 mg 250 mL/hr over 120 Minutes Intravenous  Once 04/01/20 1154 04/01/20 1206   04/01/20 1154  vancomycin variable dose per unstable renal function (pharmacist dosing)  Status:  Discontinued         Does not apply See admin instructions 04/01/20 1154 04/02/20 1030   03/11/20 1200  cefTRIAXone (ROCEPHIN) 2 g in sodium chloride 0.9 % 100 mL IVPB        2 g 200 mL/hr over 30 Minutes Intravenous Every 24 hours 03/11/20 1107 03/17/20 1307   03/10/20 1200  piperacillin-tazobactam (ZOSYN) IVPB 3.375 g  Status:  Discontinued        3.375 g 12.5 mL/hr over 240 Minutes Intravenous Every 12 hours 03/10/20 1117 03/10/20 1245   03/10/20 1130  vancomycin (VANCOCIN) IVPB 1000 mg/200 mL premix        1,000 mg 200 mL/hr over 60 Minutes Intravenous  Once 03/10/20 1117 03/10/20 1258   03/10/20 1000  vancomycin variable dose per unstable renal function (pharmacist dosing)  Status:  Discontinued         Does not apply See admin instructions 03/09/20 1412 03/14/20 0810   03/09/20 2300  piperacillin-tazobactam (ZOSYN) IVPB 2.25 g  Status:  Discontinued        2.25 g 100 mL/hr over 30 Minutes Intravenous Every 8 hours 03/09/20 1412 03/10/20 1117   03/09/20 1415  piperacillin-tazobactam (ZOSYN) IVPB 3.375 g        3.375 g 100 mL/hr over 30 Minutes Intravenous  Once 03/09/20 1412 03/09/20 1537   03/09/20 1415  vancomycin (VANCOCIN) 2,250 mg in sodium chloride 0.9 % 500 mL IVPB        2,250 mg 250 mL/hr over 120 Minutes Intravenous  Once 03/09/20 1412 03/09/20 1639   02/28/20 2200  ceFEPIme (MAXIPIME) 1 g in sodium chloride 0.9 % 100 mL IVPB        1 g 200 mL/hr over 30 Minutes Intravenous Every 12 hours  02/28/20 1305 02/29/20 2220   02/28/20 0907  ceFEPIme (MAXIPIME) 1 g in sodium chloride 0.9 % 100 mL IVPB  Status:  Discontinued  1 g 200 mL/hr over 30 Minutes Intravenous Every 24 hours 02/27/20 0939 02/28/20 1305   02/26/20 2200  ceFEPIme (MAXIPIME) 2 g in sodium chloride 0.9 % 100 mL IVPB  Status:  Discontinued        2 g 200 mL/hr over 30 Minutes Intravenous Every 12 hours 02/26/20 1519 02/27/20 0939   02/25/20 1200  ceFEPIme (MAXIPIME) 2 g in sodium chloride 0.9 % 100 mL IVPB  Status:  Discontinued        2 g 200 mL/hr over 30 Minutes Intravenous Every T-Th-Sa (Hemodialysis) 02/25/20 0955 02/26/20 1519   02/24/20 1904  azithromycin (ZITHROMAX) 500 mg in sodium chloride 0.9 % 250 mL IVPB  Status:  Discontinued        500 mg 250 mL/hr over 60 Minutes Intravenous Every 24 hours 02/24/20 1904 02/26/20 0815   02/24/20 1900  azithromycin (ZITHROMAX) 250 mg in dextrose 5 % 125 mL IVPB  Status:  Discontinued        250 mg 125 mL/hr over 60 Minutes Intravenous Every 24 hours 02/24/20 1845 02/24/20 1904   02/24/20 1800  ceFEPIme (MAXIPIME) 2 g in sodium chloride 0.9 % 100 mL IVPB  Status:  Discontinued        2 g 200 mL/hr over 30 Minutes Intravenous Every Fri (Hemodialysis) 02/24/20 1326 02/25/20 0942   02/22/20 1745  ceFAZolin (ANCEF) IVPB 2g/100 mL premix        2 g 200 mL/hr over 30 Minutes Intravenous  Once 02/22/20 1744 02/22/20 1931   02/22/20 0600  ceFAZolin (ANCEF) IVPB 2g/100 mL premix        2 g 200 mL/hr over 30 Minutes Intravenous To Radiology 02/21/20 1658 02/22/20 1900   02/22/20 0000  ceFAZolin (ANCEF) IVPB 1 g/50 mL premix  Status:  Discontinued        1 g 100 mL/hr over 30 Minutes Intravenous To Radiology 02/21/20 1645 02/21/20 1658   02/21/20 1800  azithromycin (ZITHROMAX) tablet 250 mg  Status:  Discontinued       "Followed by" Linked Group Details   250 mg Oral Daily-1800 02/20/20 1658 02/24/20 1852   02/21/20 1800  ceFEPIme (MAXIPIME) 2 g in sodium chloride 0.9 %  100 mL IVPB  Status:  Discontinued        2 g 200 mL/hr over 30 Minutes Intravenous Every T-Th-Sa (1800) 02/20/20 1737 02/24/20 1326   02/20/20 1800  azithromycin (ZITHROMAX) tablet 500 mg       "Followed by" Linked Group Details   500 mg Oral Daily 02/20/20 1658 02/20/20 1731   02/20/20 1745  ceFEPIme (MAXIPIME) 1 g in sodium chloride 0.9 % 100 mL IVPB        1 g 200 mL/hr over 30 Minutes Intravenous STAT 02/20/20 1737 02/21/20 0747   02/19/20 1145  remdesivir 100 mg in sodium chloride 0.9 % 100 mL IVPB        100 mg 200 mL/hr over 30 Minutes Intravenous Daily 02/19/20 1131 02/19/20 1443   02/15/20 1000  remdesivir 100 mg in sodium chloride 0.9 % 100 mL IVPB  Status:  Discontinued       "Followed by" Linked Group Details   100 mg 200 mL/hr over 30 Minutes Intravenous Daily 02/14/20 0944 02/14/20 0951   02/15/20 1000  remdesivir 100 mg in sodium chloride 0.9 % 100 mL IVPB        100 mg 200 mL/hr over 30 Minutes Intravenous Daily 02/14/20 0952 02/17/20 1102   02/14/20 1030  remdesivir 100 mg in sodium chloride 0.9 % 100 mL IVPB        100 mg 200 mL/hr over 30 Minutes Intravenous Every 30 min 02/14/20 0952 02/14/20 1129   02/14/20 0945  remdesivir 200 mg in sodium chloride 0.9% 250 mL IVPB  Status:  Discontinued       "Followed by" Linked Group Details   200 mg 580 mL/hr over 30 Minutes Intravenous Once 02/14/20 0944 02/14/20 0951      Assessment/Plan Morbid obesityBMI 42.02 Hypothyroidism HTN GERD ESRDon HD DM Asthma Chronic respiratory failure 2/2 Covid-19 (diagnosed 02/09/20) S/p tracheostomy Code status DNR  Necrotizing soft tissue infection S/pincision and debridement of sacrum, left gluteus11/2 Dr. Bobbye Ramirez - afebrile, WBC WNL - wound is overall clean and actively draining infectious fluid. Agree with WOC RN decision to discontinue wound VAC and initiate wet to dry dressing changes BID.  - No  role for further debridement in the OR - CCS will check the wound again  on Friday and make further recommendations. Ultimately, once all signs and symptoms of infection are done, patient wound benefit from plastic surgery evaluation for potential need for a skin flap or other surgical closure.    LOS: 31 days    Wanda Ramirez, [facility] Please see Amion for pager number during day hours 7:00am-4:30pm

## 2020-05-02 NOTE — Plan of Care (Signed)
  Problem: Respiratory: Goal: Will maintain a patent airway Outcome: Progressing   Problem: Respiratory: Goal: Complications related to the disease process, condition or treatment will be avoided or minimized Outcome: Progressing   Problem: Clinical Measurements: Goal: Respiratory complications will improve Outcome: Progressing   Problem: Clinical Measurements: Goal: Cardiovascular complication will be avoided Outcome: Progressing   Problem: Pain Managment: Goal: General experience of comfort will improve Outcome: Progressing

## 2020-05-03 LAB — RENAL FUNCTION PANEL
Albumin: 1.8 g/dL — ABNORMAL LOW (ref 3.5–5.0)
Anion gap: 18 — ABNORMAL HIGH (ref 5–15)
BUN: 90 mg/dL — ABNORMAL HIGH (ref 6–20)
CO2: 24 mmol/L (ref 22–32)
Calcium: 10.3 mg/dL (ref 8.9–10.3)
Chloride: 90 mmol/L — ABNORMAL LOW (ref 98–111)
Creatinine, Ser: 5 mg/dL — ABNORMAL HIGH (ref 0.44–1.00)
GFR, Estimated: 10 mL/min — ABNORMAL LOW (ref >60)
Glucose, Bld: 129 mg/dL — ABNORMAL HIGH (ref 70–99)
Phosphorus: 4.3 mg/dL (ref 2.5–4.6)
Potassium: 3.6 mmol/L (ref 3.5–5.1)
Sodium: 132 mmol/L — ABNORMAL LOW (ref 135–145)

## 2020-05-03 LAB — CBC
HCT: 23.9 % — ABNORMAL LOW (ref 36.0–46.0)
Hemoglobin: 7.7 g/dL — ABNORMAL LOW (ref 12.0–15.0)
MCH: 28.3 pg (ref 26.0–34.0)
MCHC: 32.2 g/dL (ref 30.0–36.0)
MCV: 87.9 fL (ref 80.0–100.0)
Platelets: 433 10*3/uL — ABNORMAL HIGH (ref 150–400)
RBC: 2.72 MIL/uL — ABNORMAL LOW (ref 3.87–5.11)
RDW: 14.4 % (ref 11.5–15.5)
WBC: 9.3 10*3/uL (ref 4.0–10.5)
nRBC: 0 % (ref 0.0–0.2)

## 2020-05-03 LAB — GLUCOSE, CAPILLARY
Glucose-Capillary: 125 mg/dL — ABNORMAL HIGH (ref 70–99)
Glucose-Capillary: 113 mg/dL — ABNORMAL HIGH (ref 70–99)
Glucose-Capillary: 145 mg/dL — ABNORMAL HIGH (ref 70–99)
Glucose-Capillary: 175 mg/dL — ABNORMAL HIGH (ref 70–99)

## 2020-05-03 MED ORDER — DIPHENHYDRAMINE HCL 50 MG/ML IJ SOLN
25.0000 mg | Freq: Once | INTRAMUSCULAR | Status: AC
  Administered 2020-05-03: 25 mg via INTRAVENOUS

## 2020-05-03 MED ORDER — HEPARIN SODIUM (PORCINE) 1000 UNIT/ML DIALYSIS: 2000.0000 [IU] | Freq: Once | INTRAMUSCULAR | Status: DC

## 2020-05-03 MED ORDER — SODIUM CHLORIDE 0.9 % IV SOLN
2.0000 g | INTRAVENOUS | Status: DC
  Administered 2020-05-03: 2 g via INTRAVENOUS
  Filled 2020-05-03: qty 2
  Filled 2020-05-03: qty 2000

## 2020-05-03 MED ORDER — SODIUM CHLORIDE 0.9 % IV SOLN
500.0000 mg | INTRAVENOUS | Status: AC
  Administered 2020-05-03 – 2020-05-12 (×10): 500 mg via INTRAVENOUS
  Filled 2020-05-03 (×10): qty 0.5

## 2020-05-03 MED ORDER — SODIUM CHLORIDE 0.9 % IV SOLN
2.0000 g | INTRAVENOUS | Status: DC
  Filled 2020-05-03: qty 2000

## 2020-05-03 MED ORDER — DARBEPOETIN ALFA 150 MCG/0.3ML IJ SOSY
PREFILLED_SYRINGE | INTRAMUSCULAR | Status: AC
  Filled 2020-05-03: qty 0.3

## 2020-05-03 MED ORDER — DIPHENHYDRAMINE HCL 50 MG/ML IJ SOLN
INTRAMUSCULAR | Status: AC
  Filled 2020-05-03: qty 1

## 2020-05-03 MED ORDER — HEPARIN SODIUM (PORCINE) 1000 UNIT/ML IJ SOLN
INTRAMUSCULAR | Status: AC
  Administered 2020-05-03: 1000 [IU]
  Filled 2020-05-03: qty 4

## 2020-05-03 NOTE — Progress Notes (Signed)
[facility] Progress Note  Subjective: pt seen on HD  Vitals:   05/03/20 1200 05/03/20 1230 05/03/20 1300 05/03/20 1330  BP: 130/80 128/74 129/75 112/70  Pulse: (!) 106 (!) 105 (!) 103 (!) 103  Resp:      Temp:      TempSrc:      SpO2: 97% 94% 100% 100%  Weight:      Height:        Exam: OP HD:TTS  4h 50mn 450/800 2/2.25 bath 111.5kg Hep 2000 L AVF - darbe 50 ug q week, last 9/7 - calc 1.0 tiw - 9/9 Hb 10.0, tsat 22%  49yo female w/ ESRD on TTS HD admitted for COVID PNA on 02/13/20. She was intubated. SP CRRT around 9/26- 10/7. Now is on intermittent HD MWF here. She is sp trach. She had MSSA pna. DM2.Then she had large sacral decub requiring I&D.    Assessment/ Plan: 1. COVID pna sp trach complicated by MSSA PNA - off vent on trach collar 2. Hypotension - BP's stable on midodrine 10 tid 3. Stage IV sacral decub:- sp I&D x 2. Off abx. Getting wound care w/ BID saline rinses, pain control per pmd. 4. ESRD - HD here on TTS schedule. SP CRRT. HD today.  5. Volume - midodrine TID. Ave UF 2-2 .5 L as tol.  4. Anemia ckd/ critical illness - on darbe 150 weekly, 11/12 Ferritin >4000, iron sat 11. With active infection and ^^ ferritin no IV iron. tx prbc's PRN.  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 HD access: LUA AVF clotted here during acute covid. TDC x2 per IR 7. Nutrition - getting NG Nepro tube feeds.  FEES with SLP 11/23.  Dysphagia 2, thin liquids, wearing PMV with it.   8. DM2 - per pmd 9. Thrush: Nystatin QID 10. Dispo: would need LTACH/ [facility] unless trach can be dc'd.  Family aware.   Rob Zeus Marquis 05/03/2020, 1:53 PM   Recent Labs  Lab 05/01/20 0558 05/02/20 0148 05/02/20 0628 05/03/20 0339  K  --  3.4*  --  3.6  BUN  --  53*  --  90*  CREATININE  --  3.39*  --  5.00*  CALCIUM  --  9.0  --  10.3  PHOS  --  2.8  --  4.3  HGB   < >  --  7.7*  7.7*   < > = values in this interval not displayed.   Inpatient medications: . sodium chloride   Intravenous Once  . B-complex with vitamin C  1 tablet Per Tube Daily  . chlorhexidine gluconate (MEDLINE KIT)  15 mL Mouth Rinse BID  . Chlorhexidine Gluconate Cloth  6 each Topical Daily  . clonazepam  0.25 mg Per Tube Daily  . collagenase   Topical Daily  . Darbepoetin Alfa      . darbepoetin (ARANESP) injection - DIALYSIS  150 mcg Intravenous Q Thu-HD  . diphenhydrAMINE      . feeding supplement (PROSource TF)  45 mL Per Tube TID  . free water  20 mL Per Tube Q6H  . [START ON 05/04/2020] heparin  2,000 Units Dialysis Once in dialysis  . [START ON 05/04/2020] heparin  2,000 Units Dialysis Once in dialysis  . heparin injection (subcutaneous)  5,000 Units Subcutaneous Q8H  . hydrocerin   Topical Daily  . insulin  aspart  0-20 Units Subcutaneous Q4H  . insulin glargine  30 Units Subcutaneous Daily  . levothyroxine  125 mcg Per Tube Q0600  . midodrine  10 mg Per Tube TID WC  . multivitamin  1 tablet Per Tube QHS  . nutrition supplement (JUVEN)  1 packet Per Tube BID  . nystatin  5 mL Mouth/Throat QID  . oxyCODONE  10 mg Per Tube Q6H  . pantoprazole sodium  40 mg Per Tube Daily  . QUEtiapine  50 mg Per Tube QHS  . sodium chloride flush  10-40 mL Intracatheter Q12H  . Thrombi-Pad  1 each Topical Once   . sodium chloride 250 mL (04/08/20 2040)  . feeding supplement (NEPRO CARB STEADY) 1,000 mL (05/02/20 1315)   sodium chloride, acetaminophen (TYLENOL) oral liquid 160 mg/5 mL, albuterol, camphor-menthol, fentaNYL (SUBLIMAZE) injection, influenza vac split quadrivalent PF, lip balm, ondansetron (ZOFRAN) IV, polyethylene glycol, silver nitrate applicators, sodium chloride flush

## 2020-05-03 NOTE — Progress Notes (Signed)
Physical Therapy Treatment Patient Details Name: Wanda Ramirez MRN: [id number] DOB: 04/15/1971 Today's Date: 05/03/2020    History of Present Illness 49 yo admitted 9/13 with Covid PNA, intubated 9/24-10/6, reintubated 10/8, CRRT 9/26-10/7, trach and bronch 10/18. Pt returned to vent 10/30-11/7. Pt with significant sacral wound to bone s/p I&D x 2. Pt with hemorrhage from wound 11/14, hemorrhagic shock 11/15. PMhx: ESRD TTS, obesity, HTN, GERD, hypothyroidism, DM, asthma    PT Comments    Pt participated in tilt bed today with increased tolerance higher angles for longer periods of time with even weight distribution between both legs and was only limited by the pain in her L buttock from the pressure ulcer. Pt was able to perform dynamic exercise in standing at 30 degrees on tilt bed with eccentric control. Pt showed less need for assistance with bed mobility and R lean preference in sitting. Pt would continue to benefit from using the tilt bed in order to increase standing weight bearing tolerance.   Follow Up Recommendations  LTACH;Supervision/Assistance - 24 hour     Equipment Recommendations  Wheelchair (measurements PT);Wheelchair cushion (measurements PT);Hospital bed;Other (comment)    Recommendations for Other Services       Precautions / Restrictions Precautions Precautions: Fall Precaution Comments: trach, cortrak, sacral wound Other Brace: bil prevalons Restrictions Weight Bearing Restrictions: No    Mobility  Bed Mobility Overal bed mobility: Needs Assistance Bed Mobility: Rolling;Sidelying to Sit;Sit to Supine Rolling: Min guard Sidelying to sit: Min assist Supine to sit: Min assist     General bed mobility comments: pt able to roll with rails and sit up with cues, railing, and min assist at the trunk; pt min assist with supine to sit for assistance with legs  Transfers    Tilt bed -Start time: 0727, angle: 30 degrees; total minutes in angle: 3 minutes; BP  154/106; SpO2 98% on room air; pt cooperative;10x squats performed -40 degrees for 2 minutes, 115 bpm, 94% SpO2 on room air, 115 bpm, pt cooperative -52 degrees for 2 minutes, pt started to have pain in buttock, requested to go lower -35 degrees for 1 minute, pt still had pain in buttock, requested to lay down -End time: 0735             General transfer comment: not attempted  Ambulation/Gait             General Gait Details: not attempted   Stairs             Wheelchair Mobility    Modified Rankin (Stroke Patients Only)       Balance Overall balance assessment: Needs assistance Sitting-balance support: Feet unsupported;Bilateral upper extremity supported Sitting balance-Leahy Scale: Fair Sitting balance - Comments: EOB 3 minutes, pt able to maintain support without balance but preferred bilat UE support; pt had right lateral lean in sitting secondary to pressure ulcer Postural control: Right lateral lean                                  Cognition Arousal/Alertness: Awake/alert Behavior During Therapy: WFL for tasks assessed/performed Overall Cognitive Status: Impaired/Different from baseline Area of Impairment: Attention;Following commands;Awareness;Safety/judgement;Problem solving                   Current Attention Level: Sustained   Following Commands: Follows one step commands inconsistently;Follows one step commands with increased time Safety/Judgement: Decreased awareness of deficits;Decreased awareness of safety Awareness:  Emergent Problem Solving: Requires verbal cues;Requires tactile cues;Slow processing General Comments: pt with decreased awareness of body position in bed      Exercises General Exercises - Lower Extremity Ankle Circles/Pumps: AROM;Both;10 reps;Seated Long Arc Quad: AROM;Both;Seated;15 reps    General Comments        Pertinent Vitals/Pain Pain Assessment: 0-10 Pain Score: 8  Pain Location: L  buttock Pain Descriptors / Indicators: Grimacing;Discomfort Pain Intervention(s): Monitored during session;Repositioned;Limited activity within patient's tolerance    Home Living                      Prior Function            PT Goals (current goals can now be found in the care plan section) Acute Rehab PT Goals Patient Stated Goal: stand Progress towards PT goals: Progressing toward goals    Frequency    Min 3X/week      PT Plan Current plan remains appropriate    Co-evaluation              AM-PAC PT "6 Clicks" Mobility   Outcome Measure  Help needed turning from your back to your side while in a flat bed without using bedrails?: A Little Help needed moving from lying on your back to sitting on the side of a flat bed without using bedrails?: A Little Help needed moving to and from a bed to a chair (including a wheelchair)?: Total Help needed standing up from a chair using your arms (e.g., wheelchair or bedside chair)?: Total Help needed to walk in hospital room?: Total Help needed climbing 3-5 steps with a railing? : Total 6 Click Score: 10    End of Session   Activity Tolerance: Patient tolerated treatment well Patient left: in bed;with bed alarm set;with call bell/phone within reach Nurse Communication: Mobility status PT Visit Diagnosis: Muscle weakness (generalized) (M62.81);Difficulty in walking, not elsewhere classified (R26.2);Pain     Time: 0719-0759 PT Time Calculation (min) (ACUTE ONLY): 40 min  Charges:  $Therapeutic Exercise: 8-22 mins $Therapeutic Activity: 23-37 mins                     Caleb Popp, SPT [id number]  Wanda Ramirez 05/03/2020, 10:22 AM

## 2020-05-03 NOTE — Progress Notes (Signed)
OT Cancellation Note  Patient Details Name: Wanda Ramirez MRN: [id number] DOB: Jul 17, 1970   Cancelled Treatment:    Reason Eval/Treat Not Completed: Patient at procedure or test/ unavailable;Other (comment) Patient in HD upon arrival to unit, therapy will re-attempt as time permits.   Corinne Ports E. [place], [place] [facility] [phone] [place] 05/03/2020, 10:57 AM

## 2020-05-03 NOTE — Progress Notes (Signed)
PHARMACY NOTE:  ANTIMICROBIAL RENAL DOSAGE ADJUSTMENT  Current antimicrobial regimen includes a mismatch between antimicrobial dosage and estimated renal function.  As per policy approved by the Pharmacy & Therapeutics and Medical Executive Committees, the antimicrobial dosage will be adjusted accordingly.  Current antimicrobial dosage:  Meropenem 1g q8 hours, Ampicilin 1g q4 hours  Indication: Wound infection  Renal Function:  Estimated Creatinine Clearance: 15.5 mL/min (A) (by C-G formula based on SCr of 5 mg/dL (H)). [x]      On intermittent HD, scheduled: []      On CRRT    Antimicrobial dosage has been changed to:   Meropenem 1g q 24 hours Ampicillin 2g q24 hours   Additional comments:Will order both to be given after HD on HD days   Thank you for allowing pharmacy to be a part of this patient's care.  Erin Hearing PharmD., BCPS Clinical Pharmacist 05/03/2020 4:30 PM

## 2020-05-03 NOTE — Progress Notes (Signed)
HD#79 Subjective:  Overnight Events: no events overnight.   Patient denies any new complaints today. She denies any systemic symptoms of infection.   Objective:  Vital signs in last 24 hours: Vitals:   05/03/20 1350 05/03/20 1353 05/03/20 1507 05/03/20 1518  BP: 127/71 129/72  123/69  Pulse: (!) 107  (!) 108 (!) 106  Resp:   20 20  Temp:  98.3 F (36.8 C)  98.6 F (37 C)  TempSrc:  Oral  Oral  SpO2: 100% 97% 100% 99%  Weight:      Height:       Supplemental O2: Trach collar SpO2: 99 % O2 Flow Rate (L/min): 5 L/min FiO2 (%): 28 %   Physical Exam:  Physical Exam Constitutional:      General: She is not in acute distress.    Appearance: She is ill-appearing.  HENT:     Head: Normocephalic and atraumatic.  Cardiovascular:     Rate and Rhythm: Normal rate and regular rhythm.  Pulmonary:     Effort: Pulmonary effort is normal. No respiratory distress.  Abdominal:     General: There is no distension.  Musculoskeletal:        General: No swelling. Normal range of motion.     Cervical back: Normal range of motion.  Skin:    General: Skin is warm and dry.     Comments: Surgical wound continues to have purulent foul smelling drainage. Wound bases appear pink and moist  Neurological:     General: No focal deficit present.     Mental Status: She is alert.     Filed Weights   04/26/20 0417 05/02/20 0459  Weight: 105 kg 104.9 kg     Intake/Output Summary (Last 24 hours) at 05/03/2020 1638 Last data filed at 05/03/2020 1350 Gross per 24 hour  Intake 380 ml  Output 2100 ml  Net -1720 ml   Net IO Since Admission: 21,338.23 mL [05/03/20 1638]  Pertinent Labs: CBC Latest Ref Rng & Units 05/03/2020 05/02/2020 05/01/2020  WBC 4.0 - 10.5 K/uL 9.3 9.6 10.7(H)  Hemoglobin 12.0 - 15.0 g/dL 7.7(L) 7.7(L) 8.2(L)  Hematocrit 36 - 46 % 23.9(L) 24.2(L) 25.0(L)  Platelets 150 - 400 K/uL 433(H) 397 429(H)    CMP Latest Ref Rng & Units 05/03/2020 05/02/2020 05/01/2020   Glucose 70 - 99 mg/dL 129(H) 142(H) 173(H)  BUN 6 - 20 mg/dL 90(H) 53(H) 127(H)  Creatinine 0.44 - 1.00 mg/dL 5.00(H) 3.39(H) 6.72(H)  Sodium 135 - 145 mmol/L 132(L) 132(L) 126(L)  Potassium 3.5 - 5.1 mmol/L 3.6 3.4(L) 4.2  Chloride 98 - 111 mmol/L 90(L) 93(L) 86(L)  CO2 22 - 32 mmol/L 24 25 22   Calcium 8.9 - 10.3 mg/dL 10.3 9.0 10.4(H)  Total Protein 6.5 - 8.1 g/dL - - -  Total Bilirubin 0.3 - 1.2 mg/dL - - -  Alkaline Phos 38 - 126 U/L - - -  AST 15 - 41 U/L - - -  ALT 0 - 44 U/L - - -    Imaging: No results found.  Assessment/Plan:   Principal Problem:   COVID-19 Active Problems:   End stage renal disease on dialysis (HCC)   Diabetes mellitus type 2 in obese (HCC)   OSA (obstructive sleep apnea)   HCAP (healthcare-associated pneumonia)   Acute respiratory failure with hypoxemia (HCC)   Encephalopathy acute   ARDS (adult respiratory distress syndrome) (HCC)   Pressure injury of skin   Aspiration into airway   Status post  tracheostomy ([place])   Fever   Septic shock (HCC)   Cellulitis of buttock   Palliative care encounter   Sacral wound   Patient Summary: Wanda Eve Alstonis a 49 y.o.with a pertinent PMH of OSA, morbid obesity, hypothyroidism, hypertension, GERD, ESRD, diabetes, asthma, who presented withshortness of breathand admitted for Covid pneumonia complicated by prolonged hospital stay including subsequent MSSA infection and necrotizing soft tissue infection..  Chronic hypoxic respiratory failure s/p tracheostomy secondary to [id number] pneumonia complicated by MSSA pneumonia: Patient on trach collar tolerating well with 5 L flow. She is able to communicate better with valve in place. -Continue weaning supplemental oxygen as possible -Continue trach care and management per trach team - Will reach out to pulm team to determine timing for decannulation in the future.   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 Her surgical wound cultures previously grew ESBL and enterococcus, and she was treated with cefepime. She denies any systemic signs of infection. Regardless, I will give her a 10 day course of ampicillin and meropenem. We will continue saline washes BID for dressing changes.  -Continue BID dressing changes -Continue pain management with oxycodone 10 mg every 6, as needed fentanyl 100 mcg with dressing changes. - Start IV ampicillin and meropenem dosed with HD.   ESRD Patient has tunneled cath in place for HD -Continue HD per nephrology -Continue midodrine 10 mg 3 times daily with HD -Continue [place] nephrology   Depression/Anxiety: -Continue Seroquel nightly and Klonopin daily  Type 2 diabetes: -Continue Lantus 30 units daily and sliding scale insulin resistant  High risk for malnutrition: Continue calorie count per dietitian. Will assess patient's need for PEG tube if she cannot maintain appropriate nutrition through PO intake  Diet: tube feeds IVF: None,None VTE: Heparin Code: DNR PT/OT recs: Pending, none. TOC recs: pending   Dispo: Anticipated discharge to [facility] pending improvement.    Please contact the on call pager after 5 pm and on weekends at [phone].

## 2020-05-03 NOTE — Plan of Care (Signed)
  Problem: Education: Goal: Knowledge of risk factors and measures for prevention of condition will improve Outcome: Progressing   

## 2020-05-03 NOTE — Plan of Care (Signed)

## 2020-05-04 LAB — RENAL FUNCTION PANEL
Albumin: 1.8 g/dL — ABNORMAL LOW (ref 3.5–5.0)
Anion gap: 15 (ref 5–15)
BUN: 43 mg/dL — ABNORMAL HIGH (ref 6–20)
CO2: 25 mmol/L (ref 22–32)
Calcium: 9.6 mg/dL (ref 8.9–10.3)
Chloride: 94 mmol/L — ABNORMAL LOW (ref 98–111)
Creatinine, Ser: 3.07 mg/dL — ABNORMAL HIGH (ref 0.44–1.00)
GFR, Estimated: 18 mL/min — ABNORMAL LOW (ref >60)
Glucose, Bld: 214 mg/dL — ABNORMAL HIGH (ref 70–99)
Phosphorus: 2.7 mg/dL (ref 2.5–4.6)
Potassium: 4 mmol/L (ref 3.5–5.1)
Sodium: 134 mmol/L — ABNORMAL LOW (ref 135–145)

## 2020-05-04 LAB — GLUCOSE, CAPILLARY
Glucose-Capillary: 132 mg/dL — ABNORMAL HIGH (ref 70–99)
Glucose-Capillary: 169 mg/dL — ABNORMAL HIGH (ref 70–99)
Glucose-Capillary: 178 mg/dL — ABNORMAL HIGH (ref 70–99)
Glucose-Capillary: 191 mg/dL — ABNORMAL HIGH (ref 70–99)
Glucose-Capillary: 194 mg/dL — ABNORMAL HIGH (ref 70–99)
Glucose-Capillary: 208 mg/dL — ABNORMAL HIGH (ref 70–99)

## 2020-05-04 LAB — CBC
HCT: 22.2 % — ABNORMAL LOW (ref 36.0–46.0)
Hemoglobin: 7.2 g/dL — ABNORMAL LOW (ref 12.0–15.0)
MCH: 28.1 pg (ref 26.0–34.0)
MCHC: 32.4 g/dL (ref 30.0–36.0)
MCV: 86.7 fL (ref 80.0–100.0)
Platelets: 439 10*3/uL — ABNORMAL HIGH (ref 150–400)
RBC: 2.56 MIL/uL — ABNORMAL LOW (ref 3.87–5.11)
RDW: 14.5 % (ref 11.5–15.5)
WBC: 8 10*3/uL (ref 4.0–10.5)
nRBC: 0 % (ref 0.0–0.2)

## 2020-05-04 MED ORDER — DAKINS (1/4 STRENGTH) 0.125 % EX SOLN
Freq: Two times a day (BID) | CUTANEOUS | Status: DC
  Administered 2020-05-04: 1 via TOPICAL
  Filled 2020-05-04 (×3): qty 473

## 2020-05-04 MED ORDER — INSULIN GLARGINE 100 UNIT/ML ~~LOC~~ SOLN
35.0000 [IU] | Freq: Every day | SUBCUTANEOUS | Status: DC
  Administered 2020-05-04 – 2020-05-05 (×2): 35 [IU] via SUBCUTANEOUS
  Filled 2020-05-04 (×3): qty 0.35

## 2020-05-04 MED ORDER — JUVEN PO PACK
1.0000 | PACK | Freq: Two times a day (BID) | ORAL | Status: DC
  Administered 2020-05-05 – 2020-05-15 (×20): 1 via ORAL
  Filled 2020-05-04 (×19): qty 1

## 2020-05-04 MED ORDER — GLUCERNA SHAKE PO LIQD
237.0000 mL | Freq: Three times a day (TID) | ORAL | Status: DC
  Administered 2020-05-05 – 2020-05-15 (×24): 237 mL via ORAL
  Filled 2020-05-04 (×2): qty 237

## 2020-05-04 NOTE — Progress Notes (Signed)
PT Cancellation Note  Patient Details Name: DORENDA PFANNENSTIEL MRN: [id number] DOB: 11/06/1970   Cancelled Treatment:    Reason Eval/Treat Not Completed: Patient declined, no reason specified. Pt declined this am stating fatigue   Shubh Chiara B Maaliyah Adolph 05/04/2020, 7:37 AM Bayard Males, PT Acute Rehabilitation Services Pager: [phone] Office: [phone]

## 2020-05-04 NOTE — Progress Notes (Addendum)
[facility] Progress Note  31 Days Post-Op  Subjective: CC:  NAEO. Denies pain.  Objective: Vital signs in last 24 hours: Temp:  [98 F (36.7 C)-98.6 F (37 C)] 98.1 F (36.7 C) (12/03 0300) Pulse Rate:  [99-108] 104 (12/03 0355) Resp:  [18-20] 18 (12/03 0355) BP: (108-130)/(65-81) 127/81 (12/03 0355) SpO2:  [92 %-100 %] 99 % (12/03 0355) FiO2 (%):  [28 %] 28 % (12/03 0355) Last BM Date: 05/01/20  Intake/Output from previous day: 12/02 0701 - 12/03 0700 In: 880 [P.O.:140; NG/GT:640; IV Piggyback:100] Out: 2000  Intake/Output this shift: No intake/output data recorded.  PE: Gen:  Alert, NAD, pleasant Pulm:  Normal effort Abd: Soft, non-tender, non-distended GU: wound granulating nicely and decreasing in size. In the 2'oclock position there is a roughly 3 x 3 x 12 cm tunnel that is draining fibrinopurulent exudate that is foul smelling. probing of the wound did not reveal any undrained loculations or concern for undrained fluid collections.   Psych: A&Ox3   Lab Results:  Recent Labs    05/03/20 0339 05/04/20 0524  WBC 9.3 8.0  HGB 7.7* 7.2*  HCT 23.9* 22.2*  PLT 433* 439*   BMET Recent Labs    05/03/20 0339 05/04/20 0524  NA 132* 134*  K 3.6 4.0  CL 90* 94*  CO2 24 25  GLUCOSE 129* 214*  BUN 90* 43*  CREATININE 5.00* 3.07*  CALCIUM 10.3 9.6   PT/INR No results for input(s): LABPROT, INR in the last 72 hours. CMP     Component Value Date/Time   NA 134 (L) 05/04/2020 0524   K 4.0 05/04/2020 0524   CL 94 (L) 05/04/2020 0524   CO2 25 05/04/2020 0524   GLUCOSE 214 (H) 05/04/2020 0524   BUN 43 (H) 05/04/2020 0524   CREATININE 3.07 (H) 05/04/2020 0524   CALCIUM 9.6 05/04/2020 0524   PROT 5.9 (L) 04/13/2020 0500   ALBUMIN 1.8 (L) 05/04/2020 0524   AST 25 04/13/2020 0500   ALT 25 04/13/2020 0500   ALKPHOS 316 (H) 04/13/2020 0500   BILITOT 0.8 04/13/2020 0500   GFRNONAA 18 (L) 05/04/2020 0524   GFRAA 33 (L) 03/05/2020 1703   Lipase   No results found for: LIPASE     Studies/Results: No results found.  Anti-infectives: Anti-infectives (From admission, onward)   Start     Dose/Rate Route Frequency Ordered Stop   05/03/20 2000  ampicillin (OMNIPEN) 2 g in sodium chloride 0.9 % 100 mL IVPB  Status:  Discontinued        2 g 300 mL/hr over 20 Minutes Intravenous Every 24 hours 05/03/20 1610 05/03/20 1629   05/03/20 1800  meropenem (MERREM) 500 mg in sodium chloride 0.9 % 100 mL IVPB        500 mg 200 mL/hr over 30 Minutes Intravenous Every 24 hours 05/03/20 1610 05/13/20 1759   05/03/20 1800  ampicillin (OMNIPEN) 2 g in sodium chloride 0.9 % 100 mL IVPB  Status:  Discontinued        2 g 300 mL/hr over 20 Minutes Intravenous Every 24 hours 05/03/20 1629 05/04/20 0831   04/12/20 1800  meropenem (MERREM) 500 mg in sodium chloride 0.9 % 100 mL IVPB  Status:  Discontinued        500 mg 200 mL/hr over 30 Minutes Intravenous Every 24 hours 04/12/20 1746 04/14/20 1426   04/06/20 1200  vancomycin (VANCOCIN) IVPB 1000 mg/200 mL premix        1,000 mg 200  mL/hr over 60 Minutes Intravenous Every Fri (Hemodialysis) 04/05/20 1529 04/06/20 1300   04/04/20 1200  vancomycin (VANCOCIN) IVPB 1000 mg/200 mL premix        1,000 mg 200 mL/hr over 60 Minutes Intravenous Every M-W-F (Hemodialysis) 04/04/20 1014 04/04/20 1423   04/03/20 0000  piperacillin-tazobactam (ZOSYN) IVPB 2.25 g  Status:  Discontinued        2.25 g 100 mL/hr over 30 Minutes Intravenous Every 8 hours 04/02/20 2253 04/09/20 1559   04/02/20 2345  vancomycin (VANCOCIN) IVPB 1000 mg/200 mL premix        1,000 mg 200 mL/hr over 60 Minutes Intravenous  Once 04/02/20 2251 04/03/20 0236   04/02/20 2251  vancomycin variable dose per unstable renal function (pharmacist dosing)  Status:  Discontinued         Does not apply See admin instructions 04/02/20 2251 04/07/20 1037   04/02/20 1200  ceFAZolin (ANCEF) IVPB 1 g/50 mL premix  Status:  Discontinued        1 g 100 mL/hr  over 30 Minutes Intravenous Every 24 hours 04/02/20 1030 04/02/20 2251   04/01/20 1300  vancomycin (VANCOCIN) 2,000 mg in sodium chloride 0.9 % 500 mL IVPB  Status:  Discontinued        2,000 mg 250 mL/hr over 120 Minutes Intravenous  Once 04/01/20 1203 04/01/20 1203   04/01/20 1300  vancomycin (VANCOREADY) IVPB 2000 mg/400 mL        2,000 mg 200 mL/hr over 120 Minutes Intravenous Every 12 hours 04/01/20 1205 04/01/20 1715   04/01/20 1300  vancomycin (VANCOCIN) 2,000 mg in sodium chloride 0.9 % 500 mL IVPB  Status:  Discontinued        2,000 mg 250 mL/hr over 120 Minutes Intravenous  Once 04/01/20 1207 04/01/20 1208   04/01/20 1245  vancomycin (VANCOCIN) 2,000 mg in sodium chloride 0.9 % 500 mL IVPB  Status:  Discontinued        2,000 mg 250 mL/hr over 120 Minutes Intravenous  Once 04/01/20 1154 04/01/20 1206   04/01/20 1154  vancomycin variable dose per unstable renal function (pharmacist dosing)  Status:  Discontinued         Does not apply See admin instructions 04/01/20 1154 04/02/20 1030   03/11/20 1200  cefTRIAXone (ROCEPHIN) 2 g in sodium chloride 0.9 % 100 mL IVPB        2 g 200 mL/hr over 30 Minutes Intravenous Every 24 hours 03/11/20 1107 03/17/20 1307   03/10/20 1200  piperacillin-tazobactam (ZOSYN) IVPB 3.375 g  Status:  Discontinued        3.375 g 12.5 mL/hr over 240 Minutes Intravenous Every 12 hours 03/10/20 1117 03/10/20 1245   03/10/20 1130  vancomycin (VANCOCIN) IVPB 1000 mg/200 mL premix        1,000 mg 200 mL/hr over 60 Minutes Intravenous  Once 03/10/20 1117 03/10/20 1258   03/10/20 1000  vancomycin variable dose per unstable renal function (pharmacist dosing)  Status:  Discontinued         Does not apply See admin instructions 03/09/20 1412 03/14/20 0810   03/09/20 2300  piperacillin-tazobactam (ZOSYN) IVPB 2.25 g  Status:  Discontinued        2.25 g 100 mL/hr over 30 Minutes Intravenous Every 8 hours 03/09/20 1412 03/10/20 1117   03/09/20 1415   piperacillin-tazobactam (ZOSYN) IVPB 3.375 g        3.375 g 100 mL/hr over 30 Minutes Intravenous  Once 03/09/20 1412 03/09/20 1537   03/09/20  1415  vancomycin (VANCOCIN) 2,250 mg in sodium chloride 0.9 % 500 mL IVPB        2,250 mg 250 mL/hr over 120 Minutes Intravenous  Once 03/09/20 1412 03/09/20 1639   02/28/20 2200  ceFEPIme (MAXIPIME) 1 g in sodium chloride 0.9 % 100 mL IVPB        1 g 200 mL/hr over 30 Minutes Intravenous Every 12 hours 02/28/20 1305 02/29/20 2220   02/28/20 0907  ceFEPIme (MAXIPIME) 1 g in sodium chloride 0.9 % 100 mL IVPB  Status:  Discontinued        1 g 200 mL/hr over 30 Minutes Intravenous Every 24 hours 02/27/20 0939 02/28/20 1305   02/26/20 2200  ceFEPIme (MAXIPIME) 2 g in sodium chloride 0.9 % 100 mL IVPB  Status:  Discontinued        2 g 200 mL/hr over 30 Minutes Intravenous Every 12 hours 02/26/20 1519 02/27/20 0939   02/25/20 1200  ceFEPIme (MAXIPIME) 2 g in sodium chloride 0.9 % 100 mL IVPB  Status:  Discontinued        2 g 200 mL/hr over 30 Minutes Intravenous Every T-Th-Sa (Hemodialysis) 02/25/20 0955 02/26/20 1519   02/24/20 1904  azithromycin (ZITHROMAX) 500 mg in sodium chloride 0.9 % 250 mL IVPB  Status:  Discontinued        500 mg 250 mL/hr over 60 Minutes Intravenous Every 24 hours 02/24/20 1904 02/26/20 0815   02/24/20 1900  azithromycin (ZITHROMAX) 250 mg in dextrose 5 % 125 mL IVPB  Status:  Discontinued        250 mg 125 mL/hr over 60 Minutes Intravenous Every 24 hours 02/24/20 1845 02/24/20 1904   02/24/20 1800  ceFEPIme (MAXIPIME) 2 g in sodium chloride 0.9 % 100 mL IVPB  Status:  Discontinued        2 g 200 mL/hr over 30 Minutes Intravenous Every Fri (Hemodialysis) 02/24/20 1326 02/25/20 0942   02/22/20 1745  ceFAZolin (ANCEF) IVPB 2g/100 mL premix        2 g 200 mL/hr over 30 Minutes Intravenous  Once 02/22/20 1744 02/22/20 1931   02/22/20 0600  ceFAZolin (ANCEF) IVPB 2g/100 mL premix        2 g 200 mL/hr over 30 Minutes Intravenous  To Radiology 02/21/20 1658 02/22/20 1900   02/22/20 0000  ceFAZolin (ANCEF) IVPB 1 g/50 mL premix  Status:  Discontinued        1 g 100 mL/hr over 30 Minutes Intravenous To Radiology 02/21/20 1645 02/21/20 1658   02/21/20 1800  azithromycin (ZITHROMAX) tablet 250 mg  Status:  Discontinued       "Followed by" Linked Group Details   250 mg Oral Daily-1800 02/20/20 1658 02/24/20 1852   02/21/20 1800  ceFEPIme (MAXIPIME) 2 g in sodium chloride 0.9 % 100 mL IVPB  Status:  Discontinued        2 g 200 mL/hr over 30 Minutes Intravenous Every T-Th-Sa (1800) 02/20/20 1737 02/24/20 1326   02/20/20 1800  azithromycin (ZITHROMAX) tablet 500 mg       "Followed by" Linked Group Details   500 mg Oral Daily 02/20/20 1658 02/20/20 1731   02/20/20 1745  ceFEPIme (MAXIPIME) 1 g in sodium chloride 0.9 % 100 mL IVPB        1 g 200 mL/hr over 30 Minutes Intravenous STAT 02/20/20 1737 02/21/20 0747   02/19/20 1145  remdesivir 100 mg in sodium chloride 0.9 % 100 mL IVPB  100 mg 200 mL/hr over 30 Minutes Intravenous Daily 02/19/20 1131 02/19/20 1443   02/15/20 1000  remdesivir 100 mg in sodium chloride 0.9 % 100 mL IVPB  Status:  Discontinued       "Followed by" Linked Group Details   100 mg 200 mL/hr over 30 Minutes Intravenous Daily 02/14/20 0944 02/14/20 0951   02/15/20 1000  remdesivir 100 mg in sodium chloride 0.9 % 100 mL IVPB        100 mg 200 mL/hr over 30 Minutes Intravenous Daily 02/14/20 0952 02/17/20 1102   02/14/20 1030  remdesivir 100 mg in sodium chloride 0.9 % 100 mL IVPB        100 mg 200 mL/hr over 30 Minutes Intravenous Every 30 min 02/14/20 0952 02/14/20 1129   02/14/20 0945  remdesivir 200 mg in sodium chloride 0.9% 250 mL IVPB  Status:  Discontinued       "Followed by" Linked Group Details   200 mg 580 mL/hr over 30 Minutes Intravenous Once 02/14/20 0944 02/14/20 0951      Assessment/Plan Morbid obesityBMI 42.02 Hypothyroidism HTN GERD ESRDon HD DM Asthma Chronic  respiratory failure 2/2 Covid-19 (diagnosed 02/09/20) S/p tracheostomy Code status DNR  Necrotizing soft tissue infection S/pincision and debridement of sacrum, left gluteus11/2 Dr. Bobbye Morton - afebrile, WBC WNL - wound is overall clean and actively draining infectious fluid. Continue wet to dry dressing changes BID. Noted WOC RN plans for dakins and that is fine.  - No  role for further debridement in the OR - CCS will check the wound again on Monday 12/6 and make further recommendations. Ultimately, once all signs and symptoms of infection are gone, patient wound benefit from plastic surgery evaluation for potential need for a skin flap or other surgical closure.    LOS: 78 days    Wanda Ramirez, [facility] Please see Amion for pager number during day hours 7:00am-4:30pm

## 2020-05-04 NOTE — Progress Notes (Addendum)
HD#80 Subjective:  Overnight Events: no events overnight.   Patient resting comfortably in bed.  Patient states that she continues to have coughing and mucous plugs which can make it difficult to breathe at times.  Otherwise she denies any other significant changes in symptoms.  She states that she has been eating well and feels like her appetite is starting to improve.  On re-evaluation, patient has since removed her NG tube and states that she does not want to reinsert the tube. I counseled her on the importance of PO intake over the weekend and that this will determine whether or not we will need a new NG vs PEG tube.   Objective:  Vital signs in last 24 hours: Vitals:   05/04/20 0300 05/04/20 0355 05/04/20 0739 05/04/20 1144  BP: 127/81 127/81 123/84 (!) 141/85  Pulse: (!) 106 (!) 104 (!) 106 (!) 105  Resp: 19 18 20 19   Temp: 98.1 F (36.7 C)  98.3 F (36.8 C) 98.2 F (36.8 C)  TempSrc: Oral  Oral Tympanic  SpO2: 97% 99% 100% 100%  Weight:      Height:       Supplemental O2: Trach collar SpO2: 100 % O2 Flow Rate (L/min): 5 L/min FiO2 (%): 28 %   Physical Exam:  Physical Exam Constitutional:      General: She is not in acute distress.    Appearance: Normal appearance. She is ill-appearing.  HENT:     Head: Normocephalic and atraumatic.  Eyes:     Extraocular Movements: Extraocular movements intact.  Cardiovascular:     Rate and Rhythm: Normal rate.     Pulses: Normal pulses.     Heart sounds: Normal heart sounds.  Pulmonary:     Effort: Pulmonary effort is normal.     Breath sounds: Normal breath sounds.  Abdominal:     General: Bowel sounds are normal.     Palpations: Abdomen is soft.     Tenderness: There is no abdominal tenderness.  Musculoskeletal:        General: Normal range of motion.     Cervical back: Normal range of motion.     Right lower leg: No edema.     Left lower leg: No edema.  Skin:    General: Skin is warm and dry.     Comments:  Surgical wound - was inspected by surgical team today.  Currently has bandage in place.  Neurological:     Mental Status: She is alert and oriented to person, place, and time. Mental status is at baseline.  Psychiatric:        Mood and Affect: Mood normal.     Filed Weights   04/26/20 0417 05/02/20 0459  Weight: 105 kg 104.9 kg     Intake/Output Summary (Last 24 hours) at 05/04/2020 1302 Last data filed at 05/03/2020 1812 Gross per 24 hour  Intake 760 ml  Output 2000 ml  Net -1240 ml   Net IO Since Admission: 22,098.23 mL [05/04/20 1302]  Pertinent Labs: CBC Latest Ref Rng & Units 05/04/2020 05/03/2020 05/02/2020  WBC 4.0 - 10.5 K/uL 8.0 9.3 9.6  Hemoglobin 12.0 - 15.0 g/dL 7.2(L) 7.7(L) 7.7(L)  Hematocrit 36 - 46 % 22.2(L) 23.9(L) 24.2(L)  Platelets 150 - 400 K/uL 439(H) 433(H) 397    CMP Latest Ref Rng & Units 05/04/2020 05/03/2020 05/02/2020  Glucose 70 - 99 mg/dL 214(H) 129(H) 142(H)  BUN 6 - 20 mg/dL 43(H) 90(H) 53(H)  Creatinine 0.44 - 1.00  mg/dL 3.07(H) 5.00(H) 3.39(H)  Sodium 135 - 145 mmol/L 134(L) 132(L) 132(L)  Potassium 3.5 - 5.1 mmol/L 4.0 3.6 3.4(L)  Chloride 98 - 111 mmol/L 94(L) 90(L) 93(L)  CO2 22 - 32 mmol/L 25 24 25   Calcium 8.9 - 10.3 mg/dL 9.6 10.3 9.0  Total Protein 6.5 - 8.1 g/dL - - -  Total Bilirubin 0.3 - 1.2 mg/dL - - -  Alkaline Phos 38 - 126 U/L - - -  AST 15 - 41 U/L - - -  ALT 0 - 44 U/L - - -    Imaging: No results found.  Assessment/Plan:   Principal Problem:   COVID-19 Active Problems:   End stage renal disease on dialysis (HCC)   Diabetes mellitus type 2 in obese (HCC)   OSA (obstructive sleep apnea)   HCAP (healthcare-associated pneumonia)   Acute respiratory failure with hypoxemia (HCC)   Encephalopathy acute   ARDS (adult respiratory distress syndrome) (HCC)   Pressure injury of skin   Aspiration into airway   Status post tracheostomy ([place])   Fever   Septic shock (HCC)   Cellulitis of buttock   Palliative care  encounter   Sacral wound   Patient Summary: Wanda Majer Alstonis a 49 y.o.with a pertinent PMH of OSA, morbid obesity, hypothyroidism, hypertension, GERD, ESRD, diabetes, asthma, who presented withshortness of breathand admitted for Covid pneumonia complicated by prolonged hospital stay including subsequent MSSA infection and necrotizing soft tissue infection..  Chronic hypoxic respiratory failure s/p tracheostomy secondary to JEHUD-14 pneumonia complicated by MSSA pneumonia: Patient on trach collar tolerating well with 5 L flow. She is able to communicate better with valve in place. -Continue weaning supplemental oxygen as possible -Continue trach care and management per trach team - Will reach out to pulm team to determine timing for decannulation in the future.   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 coli infection.  We will discontinue ampicillin as patient likely had enough coverage from her previous antibiotic regimen.  Continue the course of antibiotics for 10 days. No sings of systemic infection today. -ContinueBID dressing changes.  I appreciate surgery and wound care's recommendations.  We will continue wet to dry dressing changes and dankins with dressing changes. -Continue pain management with oxycodone 10 mg every 6, as needed fentanyl 100 mcg with dressing changes. - Continue meropenem dosed with HD.   ESRD Patient has tunneled cath in place for HD -Continue HD per nephrology -Continue midodrine 10 mg 3 times daily with HD -Continue Aranespper nephrology   Depression/Anxiety: -Continue Seroquel nightly and Klonopin daily  Type 2 diabetes: -Continue Lantus 35 units daily and sliding scale insulin resistant - Wide changes in blood glucose throughout the day. Will increase lantus to 35 units with hope to decrease prandial requirements and have tighter glycemic  control.  - Blood sugars ranging from 113 to 214. Goal to have blood sugars around 120 to help with wound healing.    High risk for malnutrition: Patient removed her NG tube today and declined reinsertion. I counseled her regaurding the importance of PO intake over the weekend. I spoke to the nurse regarding this matter as well and discussed counting her calories over the weekend. - will reassess her calorie count on Monday to determine if she needs NG vs PEG tube.   Diet: tube feeds IVF: None,None VTE: Heparin Code: DNR PT/OT recs: Pending, none. TOC recs: pending   Dispo:  Anticipated discharge to [facility]  pending improvement.   Marianna Payment, D.O. [facility], PGY-2 Pager: [phone], Phone: [phone] Date 05/04/2020 Time 2:19 PM   Please contact the on call pager after 5 pm and on weekends at [phone].

## 2020-05-04 NOTE — Progress Notes (Signed)
Pt cortrak tubing was clogged I attempted unclog was unsuccessful so I contacted cortrak team. They were unsuccessful so they removed and attempted to replace pt refused. Dietitian contacted DO calorie count put in place.

## 2020-05-04 NOTE — TOC Progression Note (Signed)
Transition of Care [facility]) - Progression Note    Patient Details  Name: Wanda Ramirez MRN: [id number] Date of Birth: [birth year]  Transition of Care [facility]) CM/SW Contact  Pollie Friar, RN Phone Number: 05/04/2020, 2:22 PM  Clinical Narrative:    Pt continues to not have any bed offers.  ToC following.   Expected Discharge Plan: Home/Self Care Barriers to Discharge: Continued Medical Work up  Expected Discharge Plan and Services Expected Discharge Plan: Home/Self Care   Discharge Planning Services: CM Consult   Living arrangements for the past 2 months: Apartment                                       Social Determinants of Health (SDOH) Interventions    Readmission Risk Interventions Readmission Risk Prevention Plan 05/01/2020  Transportation Screening Complete  Medication Review Press photographer) Complete  PCP or Specialist appointment within 3-5 days of discharge Complete  HRI or Home Care Consult Complete  SW Recovery Care/Counseling Consult Complete  Palliative Care Screening Not [place] Complete  Some recent data might be hidden

## 2020-05-04 NOTE — Progress Notes (Signed)
Cortrak Tube Team Note:  Received page from RN regarding Cortrak tube being clogged.   Cortrak RD attempted to unclog Cortrak tube at bedside and unsuccessful.   RD began to re-attempt tube placement but pt refused. RN notified.   Kerman Passey MS, RDN, LDN, CNSC Registered Dietitian III Clinical Nutrition RD Pager and On-Call Pager Number Located in [place]

## 2020-05-04 NOTE — Progress Notes (Signed)
[facility] Progress Note  Subjective: no c/o's today  Vitals:   05/04/20 0300 05/04/20 0355 05/04/20 0739 05/04/20 1144  BP: 127/81 127/81 123/84 (!) 141/85  Pulse: (!) 106 (!) 104 (!) 106 (!) 105  Resp: '19 18 20 19  ' Temp: 98.1 F (36.7 C)  98.3 F (36.8 C) 98.2 F (36.8 C)  TempSrc: Oral  Oral Tympanic  SpO2: 97% 99% 100% 100%  Weight:      Height:        Exam: OP HD:TTS  4h 18mn 450/800 2/2.25 bath 111.5kg Hep 2000 L AVF - darbe 50 ug q week, last 9/7 - calc 1.0 tiw - 9/9 Hb 10.0, tsat 22%  49yo female w/ ESRD on TTS HD admitted for COVID PNA on 02/13/20. She was intubated. SP CRRT around 9/26- 10/7. Now is on intermittent HD MWF here. She is sp trach. She had MSSA pna. DM2.Then she had large sacral decub requiring I&D.    Assessment/ Plan: 1. COVID pna sp trach complicated by MSSA PNA - off vent on trach collar 2. Hypotension - BP's stable on midodrine 10 tid 3. Stage IV sacral decub:- sp I&D x 2. Off abx. Getting wound care w/ BID saline rinses, pain control per pmd. 4. ESRD - HD here on TTS schedule. SP CRRT. HD Saturday 5. Volume - midodrine TID. Ave UF 2-2 .5 L as tol.  4. Anemia ckd/ critical illness - on darbe 150 weekly, 11/12 Ferritin >4000, iron sat 11. With active infection and ^^ ferritin no IV iron. tx prbc's PRN.  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 HD access: LUA AVF clotted here during acute covid. TDC x2 per IR 7. Nutrition - getting NG Nepro tube feeds.  FEES with SLP 11/23.  Dysphagia 2, thin liquids, wearing PMV with it.   8. DM2 - per pmd 9. Thrush: Nystatin QID 10. Dispo: would need LTACH/ [facility] unless trach can be dc'd.  Family aware.   Rob Irven Ingalsbe 05/04/2020, 12:58 PM   Recent Labs  Lab 05/03/20 0339 05/04/20 0524  K 3.6 4.0  BUN 90* 43*  CREATININE 5.00* 3.07*  CALCIUM 10.3 9.6  PHOS 4.3 2.7  HGB 7.7* 7.2*    Inpatient medications: . sodium chloride   Intravenous Once  . B-complex with vitamin C  1 tablet Per Tube Daily  . chlorhexidine gluconate (MEDLINE KIT)  15 mL Mouth Rinse BID  . Chlorhexidine Gluconate Cloth  6 each Topical Daily  . clonazepam  0.25 mg Per Tube Daily  . collagenase   Topical Daily  . darbepoetin (ARANESP) injection - DIALYSIS  150 mcg Intravenous Q Thu-HD  . feeding supplement (PROSource TF)  45 mL Per Tube TID  . free water  20 mL Per Tube Q6H  . heparin injection (subcutaneous)  5,000 Units Subcutaneous Q8H  . hydrocerin   Topical Daily  . insulin aspart  0-20 Units Subcutaneous Q4H  . insulin glargine  30 Units Subcutaneous Daily  . levothyroxine  125 mcg Per Tube Q0600  . midodrine  10 mg Per Tube TID WC  . multivitamin  1 tablet Per Tube QHS  . nutrition supplement (JUVEN)  1 packet Per Tube BID  . nystatin  5 mL Mouth/Throat QID  . oxyCODONE  10 mg Per Tube Q6H  . pantoprazole sodium  40 mg Per Tube Daily  . QUEtiapine  50 mg Per Tube QHS  . sodium chloride flush  10-40 mL Intracatheter Q12H  . sodium hypochlorite   Topical BID  . Thrombi-Pad  1 each Topical Once   . sodium chloride 250 mL (04/08/20 2040)  . feeding supplement (NEPRO CARB STEADY) 1,000 mL (05/02/20 1315)  . meropenem (MERREM) IV 500 mg (05/03/20 1808)   sodium chloride, acetaminophen (TYLENOL) oral liquid 160 mg/5 mL, albuterol, camphor-menthol, fentaNYL (SUBLIMAZE) injection, influenza vac split quadrivalent PF, lip balm, ondansetron (ZOFRAN) IV, polyethylene glycol, silver nitrate applicators, sodium chloride flush

## 2020-05-04 NOTE — Plan of Care (Signed)
  Problem: Education: Goal: Knowledge of risk factors and measures for prevention of condition will improve Outcome: Progressing   

## 2020-05-04 NOTE — Progress Notes (Signed)
Nutrition Follow-up  DOCUMENTATION CODES:   Morbid obesity  INTERVENTION:  -d/c Nepro TF orders -d/c Prosource TF -d/c free water flushes -d/c Juven per tube  -Provided education on importance of adequate nutrition for wound healing -Glucerna Shake po TID, each supplement provides 220 kcal and 10 grams of protein -1 packet Juven po BID, each packet provides 95 calories, 2.5 grams of protein (collagen), and 9.8 grams of carbohydrate (3 grams sugar); also contains 7 grams of L-arginine and L-glutamine, 300 mg vitamin C, 15 mg vitamin E, 1.2 mcg vitamin B-12, 9.5 mg zinc, 200 mg calcium, and 1.5 g  Calcium Beta-hydroxy-Beta-methylbutyrate to support wound healing -Magic cup TID with meals, each supplement provides 290 kcal and 9 grams of protein -48 hour Calorie count per MD; RD will follow-up with results on Monday  Instructions for calorie count: Please hang calorie count envelope on the patient's door. Document percent consumed for each item on the patient's meal tray ticket and keep in envelope. Also document percent of any supplement or snack pt consumes and keep documentation in envelope for RD to review.    NUTRITION DIAGNOSIS:   Inadequate oral intake related to inability to eat as evidenced by NPO status.  Progressing, pt on carb modified diet  GOAL:   Patient will meet greater than or equal to 90% of their needs  progressing  MONITOR:   PO intake, Diet advancement, Weight trends, Labs, I & O's, Skin, Supplement acceptance  REASON FOR ASSESSMENT:   Consult Calorie Count  ASSESSMENT:   Pt with PMH significant for ESRD on HD, chronic hypoxic respiratory failure on 3L [place] at baseline, sleep apnea, DM, HTN, HLD, hypothyroidism, and asthma. Pt admitted 02/17/20 for COVID-19 pneumonia who developed chronic respiratory failure requiring a tracheostomy after an extended stay in ICU. Pt's hospitalization has been complicated by MSSA pneumonia as well as the development of a  necrotizing soft tissue infection involving her gluteus secondary to MDR organisms. She was transferred to the IMTS 11/10.   11/23 - FEES, diet advanced to dysphagia 2 with thin liquids 11/24 - wound VAC placed 12/1- wound VAC removed; diet advanced to carb modified  Discussed pt with RN, MD, and SLP.   Cortrak team was paged due to pt's Cortrak being clogged. Cortrak team was unable to unclog tube and pt declined having the feeding tube replaced. Pt with orders for Nepro @ 15ml/hr, 6ml Prosource TF TID, and 34ml free water Q6H all per tube. Will d/c these orders given pt without enteral access. Pt also with orders for Juven BID per tube -- will transition Juven to PO.   RD and MD stressed importance of adequate po intake for wound healing with pt. Pt agreeable to use of supplements and making efforts to eat more of her food. Will conduct calorie count over weekend and follow-up with results on Monday to determine if pt will need further nutrition support. Educated Engineer, civil (consulting) on how to conduct a calorie count.   PO Intake: 0-50% x 8 recorded meals (24% average meal intake)  Pt under EDW EDW 111.5 kg Current wt 104.9 kg Last HD 12/2, Net UF 2L  Labs: Na 134 (L), CBGs 178-208-132 Medications: b-complex with vitamin c, aranesp, ss novolog Q4H, 35 units lantus daily, rena-vit  Diet Order:   Diet Order            Diet Carb Modified Fluid consistency: Thin; Room service appropriate? Yes with Assist  Diet effective now  EDUCATION NEEDS:   Not appropriate for education at this time  Skin:  Skin Assessment: Skin Integrity Issues: Skin Integrity Issues:: Unstageable, Incisions Stage II: right neck Unstageable: mid sacrum Incisions: buttocks  Last BM:  11/30 type 6 via rectal tube  Height:   Ht Readings from Last 1 Encounters:  04/05/20 5\' 2"  (1.575 m)    Weight:   Wt Readings from Last 1 Encounters:  05/02/20 104.9 kg    Ideal Body Weight:  50  kg  BMI:  Body mass index is 42.3 kg/m.  Estimated Nutritional Needs:   Kcal:  2100-2300  Protein:  120-140 grams  Fluid:  1 L + UOP    Larkin Ina, MS, RD, LDN RD pager number and weekend/on-call pager number located in [place].

## 2020-05-04 NOTE — Progress Notes (Addendum)
  Speech Language Pathology Treatment: Dysphagia;Passy Muir Speaking valve  Patient Details Name: Wanda Ramirez MRN: [id number] DOB: 28-May-1971 Today's Date: 05/04/2020 Time: 8592-9244 SLP Time Calculation (min) (ACUTE ONLY): 28 min  Assessment / Plan / Recommendation Clinical Impression  Pt is making great gains towards her goals. PMV valve not found and was previously attached via leash. SLP obtained second PMV and attached to velcro trach ties which she wore 20 min during session. Air mobilized to upper airway throughout session verbalizing in a hoarse, intelligible/audible intensity. Provided visual feedback using mirror to donn and doff valve however she could not manipulate valve this session.  Consumption of regular/thin was unremarkable- oral and pharyngeal transit functional and no indications of aspiration. What is pt's intake- can she have Cortrak removed? Swallow goals have been met and will continue to follow for PMV. Frequency downgraded to 1x/week minimum.   HPI HPI: 49 year old female with PMH of with end-stage renal disease on hemodialysis and chronic lung disease on chronic supplemental oxygen at 3 L/min. Admitted 9/13 with acute on chronic hypoxic respiratory failure due to COVID-19 pneumonia. Intubated 9/24-10/6, reintubated 10/8, CRRT 9/26-10/7, trach and bronch 10/18, trach collar 11/8. PMV trials began 10/27 with minimal toleration, SLP discharged 11/3 due to medical decline. Reconsulted 11/10. Pt currently has cuffed Shiley 6      SLP Plan  Continue with current plan of care;Other (Comment) (downgrade frequency)       Recommendations  Liquids provided via: Straw;Cup Medication Administration: Whole meds with liquid Supervision: Patient able to self feed;Intermittent supervision to cue for compensatory strategies Compensations: Slow rate;Small sips/bites Postural Changes and/or Swallow Maneuvers: Seated upright 90 degrees      Patient may use Passy-Muir Speech  Valve: During all waking hours (remove during sleep) PMSV Supervision: Intermittent MD: Please consider changing trach tube to : Smaller size;Cuffless         Oral Care Recommendations: Oral care BID Follow up Recommendations: LTACH;Skilled Nursing facility SLP Visit Diagnosis: Dysphagia, unspecified (R13.10);[place] (R49.1) Plan: Continue with current plan of care;Other (Comment) (downgrade frequency)                      [name] 05/04/2020, 10:15 AM  Orbie Pyo Colvin Caroli.Ed Risk analyst [phone] Office [phone]

## 2020-05-04 NOTE — Consult Note (Signed)
[facility] Nurse wound follow up Patient receiving care in [facility] 2W35. Wound type: Former NSTI to buttocks Measurement: deferred Wound bed: slick pink with some yellow/tan slough on flap Drainage (amount, consistency, odor) serosanginous on dressing Periwound: intact Dressing procedure/placement/frequency: I have changed the topical approach to twice daily Dakin's 1/4% solution moistened gauze for 5 days.  The plan is to restart [facility] therapy Wednesday 05/09/20. [name], RN, MSN, CWOCN, CNS-BC, pager [phone]

## 2020-05-05 LAB — RENAL FUNCTION PANEL
Albumin: 1.8 g/dL — ABNORMAL LOW (ref 3.5–5.0)
Anion gap: 15 (ref 5–15)
BUN: 67 mg/dL — ABNORMAL HIGH (ref 6–20)
CO2: 27 mmol/L (ref 22–32)
Calcium: 10.5 mg/dL — ABNORMAL HIGH (ref 8.9–10.3)
Chloride: 96 mmol/L — ABNORMAL LOW (ref 98–111)
Creatinine, Ser: 4.38 mg/dL — ABNORMAL HIGH (ref 0.44–1.00)
GFR, Estimated: 12 mL/min — ABNORMAL LOW (ref >60)
Glucose, Bld: 65 mg/dL — ABNORMAL LOW (ref 70–99)
Phosphorus: 3.5 mg/dL (ref 2.5–4.6)
Potassium: 4.2 mmol/L (ref 3.5–5.1)
Sodium: 138 mmol/L (ref 135–145)

## 2020-05-05 LAB — CBC
HCT: 23.1 % — ABNORMAL LOW (ref 36.0–46.0)
Hemoglobin: 7.8 g/dL — ABNORMAL LOW (ref 12.0–15.0)
MCH: 29.2 pg (ref 26.0–34.0)
MCHC: 33.8 g/dL (ref 30.0–36.0)
MCV: 86.5 fL (ref 80.0–100.0)
Platelets: 487 10*3/uL — ABNORMAL HIGH (ref 150–400)
RBC: 2.67 MIL/uL — ABNORMAL LOW (ref 3.87–5.11)
RDW: 14.8 % (ref 11.5–15.5)
WBC: 10.3 10*3/uL (ref 4.0–10.5)
nRBC: 0 % (ref 0.0–0.2)

## 2020-05-05 LAB — GLUCOSE, CAPILLARY
Glucose-Capillary: 54 mg/dL — ABNORMAL LOW (ref 70–99)
Glucose-Capillary: 89 mg/dL (ref 70–99)
Glucose-Capillary: 102 mg/dL — ABNORMAL HIGH (ref 70–99)
Glucose-Capillary: 84 mg/dL (ref 70–99)
Glucose-Capillary: 185 mg/dL — ABNORMAL HIGH (ref 70–99)
Glucose-Capillary: 158 mg/dL — ABNORMAL HIGH (ref 70–99)
Glucose-Capillary: 61 mg/dL — ABNORMAL LOW (ref 70–99)
Glucose-Capillary: 63 mg/dL — ABNORMAL LOW (ref 70–99)

## 2020-05-05 MED ORDER — ALUM & MAG HYDROXIDE-SIMETH 200-200-20 MG/5ML PO SUSP
15.0000 mL | Freq: Once | ORAL | Status: AC
  Administered 2020-05-05: 15 mL via ORAL
  Filled 2020-05-05: qty 30

## 2020-05-05 MED ORDER — HEPARIN SODIUM (PORCINE) 1000 UNIT/ML IJ SOLN
INTRAMUSCULAR | Status: AC
  Administered 2020-05-05: 1000 [IU]
  Filled 2020-05-05: qty 4

## 2020-05-05 MED ORDER — HEPARIN SODIUM (PORCINE) 1000 UNIT/ML DIALYSIS: 2000.0000 [IU] | Freq: Once | INTRAMUSCULAR | Status: DC

## 2020-05-05 NOTE — Progress Notes (Signed)
Patient transported to dialysis via transport.

## 2020-05-05 NOTE — Progress Notes (Addendum)
HD#81 Subjective:  Overnight Events: none  Patient resting comfortably in bed.  She states that she is trying to maintain good p.o. intake.  She states that she tolerated dialysis this morning.  She was having some epigastric abdominal pain without nausea, vomiting, or constipation/diarrhea.  Otherwise she denies any new symptoms at this time.  Objective:  Vital signs in last 24 hours: Vitals:   05/04/20 2212 05/05/20 0315 05/05/20 0416 05/05/20 0422  BP:   134/88   Pulse: (!) 101 (!) 101 98 97  Resp: 20 20 (!) 24 18  Temp:   98.7 F (37.1 C)   TempSrc:   Oral   SpO2: 100% 100% 100% 100%  Weight:      Height:       Supplemental O2: Trach collar SpO2: 100 % O2 Flow Rate (L/min): 5 L/min FiO2 (%): 28 %   Physical Exam:  Physical Exam Constitutional:      Appearance: Normal appearance.  HENT:     Head: Normocephalic and atraumatic.  Eyes:     Extraocular Movements: Extraocular movements intact.  Cardiovascular:     Rate and Rhythm: Normal rate.     Pulses: Normal pulses.     Heart sounds: Normal heart sounds.  Pulmonary:     Effort: Pulmonary effort is normal.     Breath sounds: Normal breath sounds.  Abdominal:     General: Bowel sounds are normal.     Palpations: Abdomen is soft.     Tenderness: There is no abdominal tenderness.  Musculoskeletal:        General: Normal range of motion.     Cervical back: Normal range of motion.     Right lower leg: No edema.     Left lower leg: No edema.  Skin:    General: Skin is warm and dry.     Comments: Sacral surgical wound covered with bandage.  Neurological:     Mental Status: She is alert and oriented to person, place, and time. Mental status is at baseline.  Psychiatric:        Mood and Affect: Mood normal.     Filed Weights   04/26/20 0417 05/02/20 0459  Weight: 105 kg 104.9 kg     Intake/Output Summary (Last 24 hours) at 05/05/2020 0600 Last data filed at 05/05/2020 0300 Gross per 24 hour  Intake  193.74 ml  Output --  Net 193.74 ml   Net IO Since Admission: 22,291.97 mL [05/05/20 0600]  Pertinent Labs: CBC Latest Ref Rng & Units 05/05/2020 05/04/2020 05/03/2020  WBC 4.0 - 10.5 K/uL 10.3 8.0 9.3  Hemoglobin 12.0 - 15.0 g/dL 7.8(L) 7.2(L) 7.7(L)  Hematocrit 36 - 46 % 23.1(L) 22.2(L) 23.9(L)  Platelets 150 - 400 K/uL 487(H) 439(H) 433(H)    CMP Latest Ref Rng & Units 05/05/2020 05/04/2020 05/03/2020  Glucose 70 - 99 mg/dL 65(L) 214(H) 129(H)  BUN 6 - 20 mg/dL 67(H) 43(H) 90(H)  Creatinine 0.44 - 1.00 mg/dL 4.38(H) 3.07(H) 5.00(H)  Sodium 135 - 145 mmol/L 138 134(L) 132(L)  Potassium 3.5 - 5.1 mmol/L 4.2 4.0 3.6  Chloride 98 - 111 mmol/L 96(L) 94(L) 90(L)  CO2 22 - 32 mmol/L 27 25 24   Calcium 8.9 - 10.3 mg/dL 10.5(H) 9.6 10.3  Total Protein 6.5 - 8.1 g/dL - - -  Total Bilirubin 0.3 - 1.2 mg/dL - - -  Alkaline Phos 38 - 126 U/L - - -  AST 15 - 41 U/L - - -  ALT  0 - 44 U/L - - -    Imaging: No results found.  Assessment/Plan:   Principal Problem:   COVID-19 Active Problems:   End stage renal disease on dialysis (HCC)   Diabetes mellitus type 2 in obese (HCC)   OSA (obstructive sleep apnea)   HCAP (healthcare-associated pneumonia)   Acute respiratory failure with hypoxemia (HCC)   Encephalopathy acute   ARDS (adult respiratory distress syndrome) (HCC)   Pressure injury of skin   Aspiration into airway   Status post tracheostomy ([place])   Fever   Septic shock (HCC)   Cellulitis of buttock   Palliative care encounter   Sacral wound   Patient Summary: Wanda Gfeller Alstonis a 49 y.o.with a pertinent PMH of OSA, morbid obesity, hypothyroidism, hypertension, GERD, ESRD, diabetes, asthma, who presented withshortness of breathand admitted for Covid pneumonia requiring tracheostomy and complicated by prolonged hospital stay including subsequent MSSA pneumonia and necrotizing soft tissue infection of her sacrum.  Chronic hypoxic respiratory failure s/p tracheostomy  secondary to BOFBP-10 pneumonia complicated by MSSA pneumonia: Patient remains on trach collar with 5 L supplemental O2. She is tolerating this well but continues to have significant coughing and mucus plugging. Currently managed by pulmonary team. Continue to try to wean him and oxygen demands. Defer to pulmonary critical care team for timing of decannulation.  Necrotizing soft tissue infection of sacral wound status post debridement (11/2 and 11/5) Patient is on day 3 of antibiotic therapy with meropenem dosed with dialysis. She had wound cultures growing ESBL E. coli and Enterococcus and initially treated with Zosyn and a course of vancomycin. Patient had subsequent purulent drainage noted over the last week backlogged her wound VAC. Subsequently she has been switched to twice daily wet-to-dry dressing changes with Dakin's per wound care and surgery team. Patient likely had enough antibiotics to cover Enterococcus. Therefore we will continue 10-day course of meropenem to treat for ESBL E. coli infection. There are no signs of systemic infection today.  -Continue twice daily dressing changes with wet-to-dry dressings and Dakin's.  -Continue meropenem dosed with HD for 10 days.  -Continue pain management with oxycodone 10 mg every 6 hours and fentanyl every 2 hours as needed for severe pain/breakthrough/dressing changes  ESRD Patient has tunneled cath in place for HD -Continue HD per nephrology -Continue midodrine 10 mg 3 times daily with HD -Continue Aranespper nephrology   Depression/Anxiety: -Continue Seroquel nightly and Klonopin daily  Type 2 diabetes: Blood sugars well controlled on current diabetes regimen. Patient recently lost her NG tube yesterday and therefore will require slowly p.o. intake for nutrition. Therefore we may need to decrease her insulin requirements. She needs tight control of her blood sugar for appropriate wound healing. -Continue Lantus 35 units daily and sliding  scale insulin resistant -Consider decreasing Lantus to 30 units daily with sliding scale insulin with meals.  High risk for malnutrition: Patient self removed her NG tube yesterday and declined reinsertion. We will give her a trial of p.o. intake over the weekend with a calorie count. Is very important to get accurate readings from her calorie count so we can make a decision regarding reinsertion of NG tube versus PEG tube moving forward. -Trial of p.o. intake over the weekend -Calorie count. Appreciate RD's assistance   Abdominal pain: Patient's abdominal pain may be secondary to gastroesophageal reflux.  We will try Maalox to see if that improves her pain.  Diet: Normal IVF: None,None VTE: Heparin Code: DNR PT/OT recs: LTAC, none. TOC recs:  pending   Dispo: Anticipated discharge to LTAC pending provement.    Please contact the on call pager after 5 pm and on weekends at [phone].

## 2020-05-05 NOTE — Progress Notes (Signed)
[facility] Progress Note  Subjective: no c/o's today  Vitals:   05/05/20 1030 05/05/20 1100 05/05/20 1130 05/05/20 1139  BP: 120/67 110/66 122/73 128/69  Pulse: 96 (!) 102 (!) 108 (!) 109  Resp: 14 14 (!) 23   Temp:      TempSrc:      SpO2: 100% 100% 99% 100%  Weight:      Height:        Exam: OP HD:TTS  4h 107mn 450/800 2/2.25 bath 111.5kg Hep 2000 L AVF - darbe 50 ug q week, last 9/7 - calc 1.0 tiw - 9/9 Hb 10.0, tsat 22%  49yo female w/ ESRD on TTS HD admitted for COVID PNA on 02/13/20. She was intubated. SP CRRT around 9/26- 10/7. Now is on intermittent HD MWF here. She is sp trach. She had MSSA pna. DM2.Then she had large sacral decub requiring I&D.    Assessment/ Plan: 1. COVID pna sp trach complicated by MSSA PNA - off vent on trach collar 2. Hypotension - BP's stable on midodrine 10 tid 3. Stage IV sacral decub:- sp I&D x 2. Off abx. Getting wound care w/ BID saline rinses, pain control per pmd. 4. ESRD - HD here on TTS schedule. SP CRRT. HD today 5. Volume - midodrine TID. Ave UF 2-2 .5 L as tol.  4. Anemia ckd/ critical illness - on darbe 150 weekly, 11/12 Ferritin >4000, iron sat 11. With active infection and ^^ ferritin no IV iron. tx prbc's PRN.  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 HD access: LUA AVF clotted here during acute covid. TDC x2 per IR 7. Nutrition - getting NG Nepro tube feeds.  FEES with SLP 11/23.  Dysphagia 2, thin liquids, wearing PMV with it.   8. DM2 - per pmd 9. Thrush: Nystatin QID 10. Dispo: would need LTACH/ [facility] unless trach can be dc'd.  Family aware.   Rob Grantley Savage 05/05/2020, 12:53 PM   Recent Labs  Lab 05/04/20 0524 05/05/20 0116 05/05/20 0516  K 4.0 4.2  --   BUN 43* 67*  --   CREATININE 3.07* 4.38*  --   CALCIUM 9.6 10.5*  --   PHOS 2.7 3.5  --   HGB 7.2*  --  7.8*   Inpatient medications: .  B-complex with vitamin C  1 tablet Per Tube Daily  . chlorhexidine gluconate (MEDLINE KIT)  15 mL Mouth Rinse BID  . Chlorhexidine Gluconate Cloth  6 each Topical Daily  . clonazepam  0.25 mg Per Tube Daily  . collagenase   Topical Daily  . darbepoetin (ARANESP) injection - DIALYSIS  150 mcg Intravenous Q Thu-HD  . feeding supplement (GLUCERNA SHAKE)  237 mL Oral TID WC  . heparin injection (subcutaneous)  5,000 Units Subcutaneous Q8H  . hydrocerin   Topical Daily  . insulin aspart  0-20 Units Subcutaneous Q4H  . insulin glargine  35 Units Subcutaneous Daily  . levothyroxine  125 mcg Per Tube Q0600  . midodrine  10 mg Per Tube TID WC  . multivitamin  1 tablet Per Tube QHS  . nutrition supplement (JUVEN)  1 packet Oral BID BM  . nystatin  5 mL Mouth/Throat QID  . oxyCODONE  10 mg Per Tube Q6H  . pantoprazole sodium  40 mg Per Tube Daily  . QUEtiapine  50 mg Per Tube QHS  . sodium  chloride flush  10-40 mL Intracatheter Q12H  . sodium hypochlorite   Topical BID   . sodium chloride 250 mL (04/08/20 2040)  . meropenem (MERREM) IV 500 mg (05/04/20 1834)   sodium chloride, acetaminophen (TYLENOL) oral liquid 160 mg/5 mL, albuterol, camphor-menthol, fentaNYL (SUBLIMAZE) injection, influenza vac split quadrivalent PF, lip balm, ondansetron (ZOFRAN) IV, polyethylene glycol, silver nitrate applicators, sodium chloride flush

## 2020-05-05 NOTE — Progress Notes (Signed)
Hemodialysis duration of treatment decreased to 3.0 hrs per Roney Jaffe, MD

## 2020-05-06 LAB — GLUCOSE, CAPILLARY
Glucose-Capillary: 104 mg/dL — ABNORMAL HIGH (ref 70–99)
Glucose-Capillary: 116 mg/dL — ABNORMAL HIGH (ref 70–99)
Glucose-Capillary: 161 mg/dL — ABNORMAL HIGH (ref 70–99)
Glucose-Capillary: 180 mg/dL — ABNORMAL HIGH (ref 70–99)
Glucose-Capillary: 43 mg/dL — CL (ref 70–99)
Glucose-Capillary: 67 mg/dL — ABNORMAL LOW (ref 70–99)
Glucose-Capillary: 67 mg/dL — ABNORMAL LOW (ref 70–99)
Glucose-Capillary: 74 mg/dL (ref 70–99)
Glucose-Capillary: 90 mg/dL (ref 70–99)

## 2020-05-06 LAB — RENAL FUNCTION PANEL
Albumin: 1.9 g/dL — ABNORMAL LOW (ref 3.5–5.0)
Anion gap: 13 (ref 5–15)
BUN: 33 mg/dL — ABNORMAL HIGH (ref 6–20)
CO2: 27 mmol/L (ref 22–32)
Calcium: 9.6 mg/dL (ref 8.9–10.3)
Chloride: 98 mmol/L (ref 98–111)
Creatinine, Ser: 3.04 mg/dL — ABNORMAL HIGH (ref 0.44–1.00)
GFR, Estimated: 18 mL/min — ABNORMAL LOW (ref >60)
Glucose, Bld: 59 mg/dL — ABNORMAL LOW (ref 70–99)
Phosphorus: 2.6 mg/dL (ref 2.5–4.6)
Potassium: 4.2 mmol/L (ref 3.5–5.1)
Sodium: 138 mmol/L (ref 135–145)

## 2020-05-06 LAB — HEMOGLOBIN A1C
Hgb A1c MFr Bld: 6.1 % — ABNORMAL HIGH (ref 4.8–5.6)
Mean Plasma Glucose: 128.37 mg/dL

## 2020-05-06 MED ORDER — PANTOPRAZOLE SODIUM 40 MG PO TBEC
40.0000 mg | DELAYED_RELEASE_TABLET | Freq: Every day | ORAL | Status: DC
  Administered 2020-05-06: 40 mg via ORAL

## 2020-05-06 MED ORDER — PANTOPRAZOLE SODIUM 40 MG PO PACK
40.0000 mg | PACK | Freq: Every day | ORAL | Status: DC
  Administered 2020-05-07 – 2020-05-08 (×2): 40 mg
  Filled 2020-05-06 (×3): qty 20

## 2020-05-06 MED ORDER — INSULIN ASPART 100 UNIT/ML ~~LOC~~ SOLN
0.0000 [IU] | SUBCUTANEOUS | Status: DC
  Administered 2020-05-07 – 2020-05-08 (×2): 3 [IU] via SUBCUTANEOUS

## 2020-05-06 MED ORDER — INSULIN GLARGINE 100 UNIT/ML ~~LOC~~ SOLN
32.0000 [IU] | Freq: Every day | SUBCUTANEOUS | Status: DC
  Administered 2020-05-06: 32 [IU] via SUBCUTANEOUS
  Filled 2020-05-06 (×3): qty 0.32

## 2020-05-06 NOTE — Progress Notes (Signed)
[facility] Progress Note  Subjective: no c/o's today  Vitals:   05/05/20 2328 05/06/20 0248 05/06/20 0756 05/06/20 1214  BP: 123/74 (!) 141/74 128/84 (!) 142/88  Pulse: (!) 106 (!) 106 (!) 109 (!) 112  Resp: '17 17 20 18  ' Temp: 98 F (36.7 C) 98 F (36.7 C) 98.2 F (36.8 C) 98.2 F (36.8 C)  TempSrc: Oral Oral  Oral  SpO2: 98% 100% 100% 100%  Weight:      Height:        Exam: OP HD:TTS  4h 60mn 450/800 2/2.25 bath 111.5kg Hep 2000 L AVF - darbe 50 ug q week, last 9/7 - calc 1.0 tiw - 9/9 Hb 10.0, tsat 22%  49yo female w/ ESRD on TTS HD admitted for COVID PNA on 02/13/20. She was intubated. SP CRRT around 9/26- 10/7. Now is on intermittent HD MWF here. She is sp trach. She had MSSA pna. DM2.Then she had large sacral decub requiring I&D.    Assessment/ Plan: 1. COVID pna sp trach complicated by MSSA PNA - off vent on trach collar 2. Hypotension - BP's stable on midodrine 10 tid 3. Stage IV sacral decub:- sp I&D x 2. Off abx. Better, on wound care w/ BID saline rinses, pain control per pmd. 4. ESRD - SP CRRT, now HD TTS. Next HD 12/7 5. Volume - midodrine TID. Ave UF 2-2 .5 L as tol.  4. Anemia ckd/ critical illness - on darbe 150 weekly, 11/12 Ferritin >4000, iron sat 11. With active infection and ^^ ferritin no IV iron. tx prbc's PRN.  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 HD access: LUA AVF clotted here during acute covid. TDC x2 per IR 7. DM2 - per pmd 8. Dispo: would need LTACH/ [facility] unless trach can be dc'd.  Family aware.   Rob Robertson Colclough 05/06/2020, 3:55 PM   Recent Labs  Lab 05/04/20 0524 05/04/20 0524 05/05/20 0116 05/05/20 0516 05/06/20 0057  K 4.0   < > 4.2  --  4.2  BUN 43*   < > 67*  --  33*  CREATININE 3.07*   < > 4.38*  --  3.04*  CALCIUM 9.6   < > 10.5*  --  9.6  PHOS 2.7   < > 3.5  --  2.6  HGB 7.2*  --   --  7.8*  --    < >  = values in this interval not displayed.   Inpatient medications: . B-complex with vitamin C  1 tablet Per Tube Daily  . chlorhexidine gluconate (MEDLINE KIT)  15 mL Mouth Rinse BID  . Chlorhexidine Gluconate Cloth  6 each Topical Daily  . clonazepam  0.25 mg Per Tube Daily  . collagenase   Topical Daily  . darbepoetin (ARANESP) injection - DIALYSIS  150 mcg Intravenous Q Thu-HD  . feeding supplement (GLUCERNA SHAKE)  237 mL Oral TID WC  . heparin injection (subcutaneous)  5,000 Units Subcutaneous Q8H  . hydrocerin   Topical Daily  . insulin aspart  0-15 Units Subcutaneous Q4H  . insulin glargine  32 Units Subcutaneous Daily  . levothyroxine  125 mcg Per Tube Q0600  . midodrine  10 mg Per Tube TID WC  . multivitamin  1 tablet Per Tube QHS  . nutrition supplement (JUVEN)  1 packet Oral BID BM  . nystatin  5 mL Mouth/Throat  QID  . oxyCODONE  10 mg Per Tube Q6H  . pantoprazole  40 mg Oral Daily  . QUEtiapine  50 mg Per Tube QHS  . sodium chloride flush  10-40 mL Intracatheter Q12H  . sodium hypochlorite   Topical BID   . sodium chloride 250 mL (04/08/20 2040)  . meropenem (MERREM) IV 500 mg (05/05/20 1725)   sodium chloride, acetaminophen (TYLENOL) oral liquid 160 mg/5 mL, albuterol, camphor-menthol, fentaNYL (SUBLIMAZE) injection, influenza vac split quadrivalent PF, lip balm, ondansetron (ZOFRAN) IV, polyethylene glycol, silver nitrate applicators, sodium chloride flush

## 2020-05-06 NOTE — Progress Notes (Signed)
Patient sugar to 67, gave regular soda and will recheck per orders

## 2020-05-06 NOTE — Progress Notes (Signed)
HD#82 Subjective:  Overnight Events: no overnight events   Patient continues to make progressive with eating/appetite. She states that her breathing is stable and denies any chest pain. She has a mild cough and feels like she is trying to cough up mucus. She has not had any mucus plugs recently.   Objective:  Vital signs in last 24 hours: Vitals:   05/05/20 2046 05/05/20 2328 05/06/20 0248 05/06/20 0756  BP: 129/68 123/74 (!) 141/74 128/84  Pulse: (!) 106 (!) 106 (!) 106 (!) 109  Resp: 17 17 17 20   Temp: 98.1 F (36.7 C) 98 F (36.7 C) 98 F (36.7 C) 98.2 F (36.8 C)  TempSrc:  Oral Oral   SpO2: 100% 98% 100% 100%  Weight:      Height:       Supplemental O2: trach collar SpO2: 100 % O2 Flow Rate (L/min): 5 L/min FiO2 (%): 28 %   Physical Exam:  Physical Exam Constitutional:      Appearance: Normal appearance.  HENT:     Head: Normocephalic and atraumatic.  Eyes:     Extraocular Movements: Extraocular movements intact.  Cardiovascular:     Rate and Rhythm: Normal rate.     Pulses: Normal pulses.     Heart sounds: Normal heart sounds.  Pulmonary:     Effort: Pulmonary effort is normal.     Breath sounds: Normal breath sounds.  Abdominal:     General: Bowel sounds are normal.     Palpations: Abdomen is soft.     Tenderness: There is no abdominal tenderness.  Musculoskeletal:        General: Normal range of motion.     Cervical back: Normal range of motion.     Right lower leg: No edema.     Left lower leg: No edema.  Skin:    General: Skin is warm and dry.  Neurological:     Mental Status: She is alert and oriented to person, place, and time. Mental status is at baseline.  Psychiatric:        Mood and Affect: Mood normal.     Filed Weights   04/26/20 0417 05/02/20 0459  Weight: 105 kg 104.9 kg     Intake/Output Summary (Last 24 hours) at 05/06/2020 1220 Last data filed at 05/06/2020 3903 Gross per 24 hour  Intake 30.68 ml  Output --  Net 30.68  ml   Net IO Since Admission: 19,822.65 mL [05/06/20 1220]  Pertinent Labs: CBC Latest Ref Rng & Units 05/05/2020 05/04/2020 05/03/2020  WBC 4.0 - 10.5 K/uL 10.3 8.0 9.3  Hemoglobin 12.0 - 15.0 g/dL 7.8(L) 7.2(L) 7.7(L)  Hematocrit 36 - 46 % 23.1(L) 22.2(L) 23.9(L)  Platelets 150 - 400 K/uL 487(H) 439(H) 433(H)    CMP Latest Ref Rng & Units 05/06/2020 05/05/2020 05/04/2020  Glucose 70 - 99 mg/dL 59(L) 65(L) 214(H)  BUN 6 - 20 mg/dL 33(H) 67(H) 43(H)  Creatinine 0.44 - 1.00 mg/dL 3.04(H) 4.38(H) 3.07(H)  Sodium 135 - 145 mmol/L 138 138 134(L)  Potassium 3.5 - 5.1 mmol/L 4.2 4.2 4.0  Chloride 98 - 111 mmol/L 98 96(L) 94(L)  CO2 22 - 32 mmol/L 27 27 25   Calcium 8.9 - 10.3 mg/dL 9.6 10.5(H) 9.6  Total Protein 6.5 - 8.1 g/dL - - -  Total Bilirubin 0.3 - 1.2 mg/dL - - -  Alkaline Phos 38 - 126 U/L - - -  AST 15 - 41 U/L - - -  ALT 0 - 44  U/L - - -    Imaging: No results found.  Assessment/Plan:   Principal Problem:   COVID-19 Active Problems:   End stage renal disease on dialysis (HCC)   Diabetes mellitus type 2 in obese (HCC)   OSA (obstructive sleep apnea)   HCAP (healthcare-associated pneumonia)   Acute respiratory failure with hypoxemia (HCC)   Encephalopathy acute   ARDS (adult respiratory distress syndrome) (HCC)   Pressure injury of skin   Aspiration into airway   Status post tracheostomy ([place])   Fever   Septic shock (HCC)   Cellulitis of buttock   Palliative care encounter   Sacral wound   Patient Summary: Wanda Rickerson Alstonis a 49 y.o.with a pertinent PMH of OSA, morbid obesity, hypothyroidism, hypertension, GERD, ESRD, diabetes, asthma, who presented withshortness of breathand admitted for Covid pneumonia requiring tracheostomy and complicated by prolonged hospital stay including subsequent MSSA pneumonia and necrotizing soft tissue infection of her sacrum.  Chronic hypoxic respiratory failure s/p tracheostomy secondary to ELFYB-01 pneumonia complicated by  MSSA pneumonia: Patient remains on trach collar with 5 L supplemental O2. She is tolerating this well but continues to have significant coughing and mucus plugging. Currently managed by pulmonary team. Continue to try to wean him and oxygen demands. Defer to pulmonary critical care team for timing of decannulation.  Necrotizing soft tissue infection of sacral wound status post debridement (11/2 and 11/5) Patient is on day 4 of antibiotic therapy with meropenem dosed with dialysis. She is tolerating the AB well. She denies any increase pain in her sacrum.  -Continue twice daily dressing changes with wet-to-dry dressings and Dakin's.  -Continue meropenem dosed with HD for 10 days.  -Continue pain management with oxycodone 10 mg every 6 hours and fentanyl every 2 hours as needed for severe pain/breakthrough/dressing changes  ESRD Patient has tunneled cath in place for HD -Continue HD per nephrology -Continue midodrine 10 mg 3 times daily with HD -Continue Aranespper nephrology   Depression/Anxiety: -Continue Seroquel nightly and Klonopin daily  Type 2 diabetes: Blood sugars well controlled on current diabetes regimen. Patient recently lost her NG tube yesterday and therefore will require slowly p.o. intake for nutrition. Therefore we may need to decrease her insulin requirements. She needs tight control of her blood sugar for appropriate wound healing. -Continue Lantus 35units daily and sliding scale insulin resistant -Consider decreasing Lantus to 30 units daily with sliding scale insulin with meals.  High risk for malnutrition: Patient is eating well and denies any issues with PO intake.  -Trial of p.o. intake over the weekend -Calorie count. Appreciate RD's assistance   Abdominal pain: Improved with Maaloz.    Dispo: Anticipated discharge to LTAC pending provement.Diet: Normal IVF: None,None VTE: Heparin Code: DNR PT/OT recs: LTAC, none. TOC recs: pending   Dispo:  Anticipated discharge to LTAC pending provement.    Marianna Payment, D.O. [facility], PGY-2 Pager: [phone], Phone: [phone] Date 05/06/2020 Time 12:25 PM  Please contact the on call pager after 5 pm and on weekends at [phone].

## 2020-05-06 NOTE — Progress Notes (Signed)
Notified of CBG of 43, patient given juice. Will recheck CBG per orders.

## 2020-05-07 DIAGNOSIS — J9601 Acute respiratory failure with hypoxia: Secondary | ICD-10-CM | POA: Diagnosis not present

## 2020-05-07 DIAGNOSIS — Z930 Tracheostomy status: Secondary | ICD-10-CM | POA: Diagnosis not present

## 2020-05-07 DIAGNOSIS — J80 Acute respiratory distress syndrome: Secondary | ICD-10-CM | POA: Diagnosis not present

## 2020-05-07 LAB — RENAL FUNCTION PANEL
Albumin: 1.8 g/dL — ABNORMAL LOW (ref 3.5–5.0)
Anion gap: 11 (ref 5–15)
BUN: 46 mg/dL — ABNORMAL HIGH (ref 6–20)
CO2: 27 mmol/L (ref 22–32)
Calcium: 10 mg/dL (ref 8.9–10.3)
Chloride: 98 mmol/L (ref 98–111)
Creatinine, Ser: 5.17 mg/dL — ABNORMAL HIGH (ref 0.44–1.00)
GFR, Estimated: 10 mL/min — ABNORMAL LOW (ref >60)
Glucose, Bld: 87 mg/dL (ref 70–99)
Phosphorus: 5.1 mg/dL — ABNORMAL HIGH (ref 2.5–4.6)
Potassium: 4.8 mmol/L (ref 3.5–5.1)
Sodium: 136 mmol/L (ref 135–145)

## 2020-05-07 LAB — CBC
HCT: 24.3 % — ABNORMAL LOW (ref 36.0–46.0)
Hemoglobin: 7.4 g/dL — ABNORMAL LOW (ref 12.0–15.0)
MCH: 27.3 pg (ref 26.0–34.0)
MCHC: 30.5 g/dL (ref 30.0–36.0)
MCV: 89.7 fL (ref 80.0–100.0)
Platelets: 533 10*3/uL — ABNORMAL HIGH (ref 150–400)
RBC: 2.71 MIL/uL — ABNORMAL LOW (ref 3.87–5.11)
RDW: 15.3 % (ref 11.5–15.5)
WBC: 8 10*3/uL (ref 4.0–10.5)
nRBC: 0.2 % (ref 0.0–0.2)

## 2020-05-07 LAB — GLUCOSE, CAPILLARY
Glucose-Capillary: 65 mg/dL — ABNORMAL LOW (ref 70–99)
Glucose-Capillary: 81 mg/dL (ref 70–99)
Glucose-Capillary: 70 mg/dL (ref 70–99)
Glucose-Capillary: 86 mg/dL (ref 70–99)
Glucose-Capillary: 153 mg/dL — ABNORMAL HIGH (ref 70–99)
Glucose-Capillary: 96 mg/dL (ref 70–99)
Glucose-Capillary: 101 mg/dL — ABNORMAL HIGH (ref 70–99)

## 2020-05-07 MED ORDER — INSULIN GLARGINE 100 UNIT/ML ~~LOC~~ SOLN
25.0000 [IU] | Freq: Every day | SUBCUTANEOUS | Status: DC
  Filled 2020-05-07 (×2): qty 0.25

## 2020-05-07 MED ORDER — CHLORHEXIDINE GLUCONATE CLOTH 2 % EX PADS: 6.0000 | MEDICATED_PAD | Freq: Every day | CUTANEOUS | Status: DC

## 2020-05-07 NOTE — Progress Notes (Signed)
Inpatient Rehab Admissions Coordinator Note:   Per PT recommendation, pt was screened for CIR candidacy by Gayland Curry, MS, CCC-SLP.  At this time we are not recommending an inpatient rehab consult.  Would appreciate update OT notes.  Will continue to monitor from a distance pt's tolerance and progress with therapies.  Please contact me with questions.    Gayland Curry, [place], [place] Admissions Coordinator [phone] 05/07/20 6:22 PM

## 2020-05-07 NOTE — Progress Notes (Signed)
NAME:  Wanda Ramirez, MRN:  [id number], DOB:  1970/10/10, LOS: 44 ADMISSION DATE:  02/13/2020, CONSULTATION DATE:  9/24 REFERRING MD:  Heber Norton, CHIEF COMPLAINT:  Dyspnea   Brief History   49 yo female admitted with COVID 19 pneumonia on 9/13, had been diagnosed with COVID on 9/9.  On 9/24 her condition worsened and PCCM was consulted, she was sent to the ICU and treated with BIPAP, then eventually intubated.  Started on CRRT through a tunneled HD cath. Extubated on 10/6 but re-intubated on 10/8. Now sp trach tolerating trach collar.   Past Medical History  Sleep apnea Morbid obesity Hypothyroidism Hypertension GERD  End-stage renal disease on HD MWF Diabetes Asthma  Significant Hospital Events   9/14 admit 9/25 on BiPAP with Precedex overnight 9/26 -unable to run CRRT with prone ventilation.  Resume supine ventilation but still unable to draw blood back. 9/27 transferred to 19m. IR replaced HD catheter 10/1 on CRRT and vasopressors 10/6 - extubated 10/7 - had some stridor and increased WOB so placed on BIPAP and dexamethasone given.  10/15 weaning sedation. midaz off 10/16 move to 56M 10/18 s/p 6 proximal XLT Shiley tracheostomy  10/19: Resumed Eliquis low dose. Per Dr. Thompson Caul note 10/7 will need to be on [facility] for 1 month then reasses 10/19: Episode of hypoglycemia 55. 10/21 hypoglycemic again. levemir and tubefeed coverage decreased. Low grade temp->sputum culture send had vomiting event on 10/19 w/ concern for aspiration. Fent was stopped 10/20. Still gets anxious at night. Required increased precedex. Added clonazepam. 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 Surgery placed stitch. 11/15 hypotensive. Transferred back on ICU for pressor support 11/16 Off pressors Consults:  Nephrology General Surgery Palliative Care Procedures:  9/23 tunneled HD cath placement >  10/8 ETT > 10/18 10/18 6 proximal XLT Shiley tracheostomy  > 11/2 Sacral wound debridement Significant Diagnostic Tests:  9/21 Vas ultrasound> thrombosed LUE fistula 9/21 VQ scan > negative for PE 9/29 echo> LVEF 60-65%, flat ventrcile consistent with RV pressure overload, RV severely enlarged, moderate reduction in RV function  Micro Data:  9/13 blood > neg   9/14 sars cov 2 > pos 9/26 blood > neg 10/8 resp > MSSA 10/8 blood > negative  10/21 sputum > Normal flora  10/25 Blood > ngtd x 3 days 10/25 Sputum  > few diphtheroids  10/25 Blood >>negative 10/29 Sputum >> Staph aureus 11/2 Blood>> negative 11/2 sputum >> negative 11/2 Wound >> E. Coli, enterococcus faecalis  11/15 Tracheal aspirate >> few gram negative rods, moderate gram variable rods Antimicrobials:  9/14 remdesivir > 9/19 9/15 tocilizumab > x1 9/20 cefepime > 9/29 9/20 azithro >  9/25 10/8 vanc > 10/9 10/8 zosyn > x1 10/10 ceftriaxone > 10/16 10/31 vancomycin >> 1/7 11/2 zosyn>>11/8 11/11 Merrem >>11/13 Interim history/subjective:   No new issues reported Managing her secretions adequately She is not being suctioned frequently, but does sometimes has to be suctioned at times when she is dyspneic.  Objective   Blood pressure (!) 142/51, pulse 98, temperature 98.1 F (36.7 C), temperature source Oral, resp. rate Marland Kitchen)  23, height 5\' 2"  (1.575 m), weight 104.9 kg, SpO2 100 %.    FiO2 (%):  [28 %] 28 %   Intake/Output Summary (Last 24 hours) at 05/07/2020 1256 Last data filed at 05/07/2020 0340 Gross per 24 hour  Intake 689.7 ml  Output 0 ml  Net 689.7 ml   Filed Weights    04/26/20 0417 05/02/20 0459  Weight: 105 kg 104.9 kg    Physical Exam: General: Obese woman, laying in bed, no distress HEENT: #6 cuffless XLT in place, no secretions Neuro: Awake, alert, slightly anxious, appropriate, moves all extremities, no deficits CV: Distant, no murmur PULM: Decreased bilaterally GI: Morbidly obese, nondistended, positive bowel sounds Extremities: Left upper extremity AV fistula is in place.  Bilateral lower extremity edema Skin: Sacral wound, not examined    Resolved Hospital Problem list   Thrombocytopenia  MSSA PNA  Nausea  Agitated delirium Shock  Assessment & Plan:     Acute hypoxic respiratory failure from COVID 19 pneumonia with ARDS complicated by MSSA PNA. Failure to wean s/p tracheostomy Suspected OSA/OHS Tracheostomy placed 10/18.  P:  Minimal suctioning.  It sounds like she intermittently request suctioning when she is dyspneic.  She is able to cough secretions into her oropharynx past trach.  We will downsized to a #6 uncuffed proximal XLT.  Hopefully we can start doing capping trials soon.  No indication for nocturnal ventilation based on ABG.  If she is decannulated then we would have to reassess her nocturnal ventilation, need for possible BiPAP    Best practice:  Per primary  Labs:   CMP Latest Ref Rng & Units 05/07/2020 05/06/2020 05/05/2020  Glucose 70 - 99 mg/dL 87 59(L) 65(L)  BUN 6 - 20 mg/dL 46(H) 33(H) 67(H)  Creatinine 0.44 - 1.00 mg/dL 5.17(H) 3.04(H) 4.38(H)  Sodium 135 - 145 mmol/L 136 138 138  Potassium 3.5 - 5.1 mmol/L 4.8 4.2 4.2  Chloride 98 - 111 mmol/L 98 98 96(L)  CO2 22 - 32 mmol/L 27 27 27   Calcium 8.9 - 10.3 mg/dL 10.0 9.6 10.5(H)  Total Protein 6.5 - 8.1 g/dL - - -  Total Bilirubin 0.3 - 1.2 mg/dL - - -  Alkaline Phos 38 - 126 U/L - - -  AST 15 - 41 U/L - - -  ALT 0 - 44 U/L - - -    CBC Latest Ref Rng & Units 05/07/2020 05/05/2020 05/04/2020  WBC 4.0 - 10.5 K/uL 8.0 10.3 8.0  Hemoglobin 12.0 - 15.0  g/dL 7.4(L) 7.8(L) 7.2(L)  Hematocrit 36 - 46 % 24.3(L) 23.1(L) 22.2(L)  Platelets 150 - 400 K/uL 533(H) 487(H) 439(H)    ABG    Component Value Date/Time   PHART 7.421 05/02/2020 0511   PCO2ART 41.4 05/02/2020 0511   PO2ART 113 (H) 05/02/2020 0511   HCO3 26.4 05/02/2020 0511   TCO2 30 04/03/2020 1030   ACIDBASEDEF 2.0 04/03/2020 0907   O2SAT 98.8 05/02/2020 0511    CBG (last 3)  Recent Labs    05/07/20 0545 05/07/20 0755 05/07/20 1129  GLUCAP 81 106 86    Baltazar Apo, MD, PhD 05/07/2020, 12:56 PM [facility] [phone] or if no answer [phone]

## 2020-05-07 NOTE — Progress Notes (Addendum)
Physical Therapy Treatment Patient Details Name: CLAUDETTA SALLIE MRN: [id number] DOB: 09/08/1970 Today's Date: 05/07/2020    History of Present Illness 49 yo admitted 9/13 with Covid PNA, intubated 9/24-10/6, reintubated 10/8, CRRT 9/26-10/7, trach and bronch 10/18. Pt returned to vent 10/30-11/7. Pt with significant sacral wound to bone s/p I&D x 2. Pt with hemorrhage from wound 11/14, hemorrhagic shock 11/15. PMhx: ESRD TTS, obesity, HTN, GERD, hypothyroidism, DM, asthma    PT Comments    Pt tolerated increased degrees on Kreg Bed today for an increased total time today. Pt complained of L buttock pain during tilting, so pt wanted to try sit to stand from EOB. Pt was able to perform 3x sit to stand going from mod assist +2 to min assist +2 with decreased pain but lack of full hip extension and need for posterior support from the bed. Pt shows significant improvement this session with weightbearing activity and d/c recommendation has been updated to CIR secondary to increased tolerance to skilled therapy and weightbearing activity.  Tilting: (8 minutes total) -0941 at 25 degrees -0942 at 32 degrees: 160/114 -0944 at 40 degrees: 15 squats -0947 at 52 degrees: 122 bpm and 1 squat -0948 at 30 degrees -0949 supine    Follow Up Recommendations  CIR;Supervision/Assistance - 24 hour     Equipment Recommendations  Wheelchair (measurements PT);Wheelchair cushion (measurements PT);Hospital bed;Other (comment)    Recommendations for Other Services       Precautions / Restrictions Precautions Precautions: Fall Precaution Comments: trach, sacral wound Other Brace: bil prevalons Restrictions Weight Bearing Restrictions: No    Mobility  Bed Mobility Overal bed mobility: Needs Assistance Bed Mobility: Supine to Sit;Rolling;Sit to Supine Rolling: Min guard   Supine to sit: Min guard Sit to supine: Min assist;+2 for safety/equipment   General bed mobility comments: rolling for changing  gown and able to do with the railing; pt min guard supine to sit and sit to supine for safety and assist with legs  Transfers Overall transfer level: Needs assistance   Transfers: Sit to/from Stand           General transfer comment: pt performed 3x sit to stand from EOB, first attempt mod assist +2 with bed pad and then side stepped to the R 2-3 steps mod assist +2;  second/third attempt min assist +2 10 seconds each, pt had back of legs against bed and not in full hip extension  Ambulation/Gait             General Gait Details: not attempted   Stairs             Wheelchair Mobility    Modified Rankin (Stroke Patients Only)       Balance Overall balance assessment: Needs assistance Sitting-balance support: Feet unsupported Sitting balance-Leahy Scale: Fair Sitting balance - Comments: pt able to maintain EOB sitting without bilateral UE support and was min guard   Standing balance support: No upper extremity supported;Bilateral upper extremity supported Standing balance-Leahy Scale: Zero Standing balance comment: pt able to tolerate standing from EOB with posterior support from bed and UE support                            Cognition Arousal/Alertness: Awake/alert Behavior During Therapy: WFL for tasks assessed/performed Overall Cognitive Status: Impaired/Different from baseline Area of Impairment: Attention;Memory;Following commands;Safety/judgement;Awareness;Problem solving                   Current  Attention Level: Selective Memory: Decreased short-term memory Following Commands: Follows one step commands inconsistently;Follows one step commands with increased time Safety/Judgement: Decreased awareness of deficits;Decreased awareness of safety Awareness: Emergent Problem Solving: Slow processing;Requires verbal cues        Exercises      General Comments        Pertinent Vitals/Pain Pain Assessment: 0-10 Pain Score: 8   Pain Location: L buttock Pain Intervention(s): Limited activity within patient's tolerance;Monitored during session;Repositioned    Home Living                      Prior Function            PT Goals (current goals can now be found in the care plan section) Acute Rehab PT Goals Patient Stated Goal: stand and go to rehab PT Goal Formulation: With patient Potential to Achieve Goals: Fair Progress towards PT goals: Progressing toward goals    Frequency    Min 3X/week      PT Plan Current plan remains appropriate    Co-evaluation              AM-PAC PT "6 Clicks" Mobility   Outcome Measure  Help needed turning from your back to your side while in a flat bed without using bedrails?: A Little Help needed moving from lying on your back to sitting on the side of a flat bed without using bedrails?: A Little Help needed moving to and from a bed to a chair (including a wheelchair)?: Total Help needed standing up from a chair using your arms (e.g., wheelchair or bedside chair)?: A Lot Help needed to walk in hospital room?: Total Help needed climbing 3-5 steps with a railing? : Total 6 Click Score: 11    End of Session Equipment Utilized During Treatment: Oxygen Activity Tolerance: Patient tolerated treatment well Patient left: in bed;with bed alarm set;with call bell/phone within reach Nurse Communication: Mobility status PT Visit Diagnosis: Muscle weakness (generalized) (M62.81);Difficulty in walking, not elsewhere classified (R26.2);Pain Pain - Right/Left: Left Pain - part of body: Hip     Time: 3748-2707 PT Time Calculation (min) (ACUTE ONLY): 38 min  Charges:  $Therapeutic Activity: 38-52 mins                     Caleb Popp, SPT [id number]  Kaysen Deal 05/07/2020, 11:34 AM

## 2020-05-07 NOTE — Progress Notes (Signed)
   05/07/20 2145  Provider Notification  Provider Name/Title Allyson Sabal  Date Provider Notified 05/07/20  Time Provider Notified 2145  Notification Type Page  Notification Reason Other (Comment) (CBG 96. 32 units of Lantus scheduled. please advise)  Response See new orders (decrease Lantus to 25 units)  Date of Provider Response 05/07/20  Time of Provider Response 2152

## 2020-05-07 NOTE — Progress Notes (Signed)
[facility] NEPHROLOGY PROGRESS NOTE  Assessment/ Plan: Pt is a 49 y.o. yo female  w/ ESRD on TTS HD admitted for COVID PNA on 02/13/20. She was intubated. SP CRRT around 9/26- 10/7. Now is on intermittent HD MWF here. She is sp trach. She had MSSA pna. DM2.Then she had large sacral decub requiring I&D.   OP HD:TTS  4h 58mn 450/800 2/2.25 bath 111.5kg Hep 2000 L AVF - darbe 50 ug q week, last 9/7 - calc 1.0 tiw - 9/9 Hb 10.0, tsat 22%  # COVID pna sp trach complicated by MSSA PNA - off vent on trach collar.  #Hypotension - BP's stable on midodrine 10 tid. Monitor BP.  #Stage IV sacral decub:- sp I&D x 2.  On meropenem. wound care, general surgery is following.  #ESRD - S/P CRRT, now HD TTS. Next HD 12/7.  #Anemia ckd/ critical illness - on darbe 150 weekly, 11/12 Ferritin >4000, iron sat 11. With active infection and ^^ ferritin no IV iron. tx prbc's PRN. Rechecked iron panel-- Ferritin 2777 and %sat 18, hold off on IV iron for now  #MBD ckd - Ca and phos in range, off binders. Monitor.   # HD access: LUA AVF clotted here during acute covid. TSkillmanx2 per IR  #Dispo: would need LTACH/ [facility] unless trach can be dc'd.  Family aware.  Subjective: Seen and examined bedside.  No new event.  Denies nausea vomiting chest pain. Objective Vital signs in last 24 hours: Vitals:   05/07/20 0000 05/07/20 0150 05/07/20 0757 05/07/20 0904  BP: 139/83  123/78   Pulse: (!) 110 100 100 (!) 106  Resp:  20 19 (!) 23  Temp:   98.3 F (36.8 C)   TempSrc:   Oral   SpO2: 100% 100% 100% 100%  Weight:      Height:       Weight change:   Intake/Output Summary (Last 24 hours) at 05/07/2020 0918 Last data filed at 05/07/2020 0340 Gross per 24 hour  Intake 689.7 ml  Output 0 ml  Net 689.7 ml       Labs: Basic Metabolic Panel: Recent Labs  Lab 05/05/20 0116 05/06/20 0057 05/07/20 0310  NA 138 138 136  K 4.2 4.2 4.8  CL 96* 98 98  CO2 '27 27 27  ' GLUCOSE 65*  59* 87  BUN 67* 33* 46*  CREATININE 4.38* 3.04* 5.17*  CALCIUM 10.5* 9.6 10.0  PHOS 3.5 2.6 5.1*   Liver Function Tests: Recent Labs  Lab 05/05/20 0116 05/06/20 0057 05/07/20 0310  ALBUMIN 1.8* 1.9* 1.8*   No results for input(s): LIPASE, AMYLASE in the last 168 hours. No results for input(s): AMMONIA in the last 168 hours. CBC: Recent Labs  Lab 05/01/20 0558 05/01/20 0558 05/02/20 0628 05/02/20 0628 05/03/20 0339 05/04/20 0524 05/05/20 0516  WBC 10.7*   < > 9.6   < > 9.3 8.0 10.3  HGB 8.2*   < > 7.7*   < > 7.7* 7.2* 7.8*  HCT 25.0*   < > 24.2*   < > 23.9* 22.2* 23.1*  MCV 86.8  --  86.7  --  87.9 86.7 86.5  PLT 429*   < > 397   < > 433* 439* 487*   < > = values in this interval not displayed.   Cardiac Enzymes: No results for input(s): CKTOTAL, CKMB, CKMBINDEX, TROPONINI in the last 168 hours. CBG: Recent Labs  Lab 05/06/20 2224 05/06/20 2352 05/07/20 0352 05/07/20 0545 05/07/20  0755  GLUCAP 180* 161* 65* 81 70    Iron Studies: No results for input(s): IRON, TIBC, TRANSFERRIN, FERRITIN in the last 72 hours. Studies/Results: No results found.  Medications: Infusions: . sodium chloride 250 mL (04/08/20 2040)  . meropenem (MERREM) IV 500 mg (05/06/20 1757)    Scheduled Medications: . B-complex with vitamin C  1 tablet Per Tube Daily  . chlorhexidine gluconate (MEDLINE KIT)  15 mL Mouth Rinse BID  . Chlorhexidine Gluconate Cloth  6 each Topical Daily  . clonazepam  0.25 mg Per Tube Daily  . collagenase   Topical Daily  . darbepoetin (ARANESP) injection - DIALYSIS  150 mcg Intravenous Q Thu-HD  . feeding supplement (GLUCERNA SHAKE)  237 mL Oral TID WC  . heparin injection (subcutaneous)  5,000 Units Subcutaneous Q8H  . hydrocerin   Topical Daily  . insulin aspart  0-15 Units Subcutaneous Q4H  . insulin glargine  32 Units Subcutaneous Daily  . levothyroxine  125 mcg Per Tube Q0600  . midodrine  10 mg Per Tube TID WC  . multivitamin  1 tablet Per Tube  QHS  . nutrition supplement (JUVEN)  1 packet Oral BID BM  . nystatin  5 mL Mouth/Throat QID  . oxyCODONE  10 mg Per Tube Q6H  . pantoprazole sodium  40 mg Per Tube Daily  . QUEtiapine  50 mg Per Tube QHS  . sodium chloride flush  10-40 mL Intracatheter Q12H    have reviewed scheduled and prn medications.  Physical Exam: General: Comfortable, has tracheostomy Heart:RRR, s1s2 nl Lungs: Bibasal rhonchi and some coarse breath sound Abdomen:soft, Non-tender, non-distended Extremities: Trace edema Dialysis Access: Left IJ tunnel HD catheter  Iyanna Drummer Prasad Sevin Farone 05/07/2020,9:18 AM  LOS: 83 days  Pager: [phone]

## 2020-05-07 NOTE — Progress Notes (Signed)
Calorie Count Note  72 hour calorie count ordered.  Diet: Carb Modified Supplements: Glucerna po TID, Juven po BID, Magic Cup TID  Day 1 Results (12/3) Breakfast: 86 kcal, 4 grams protein Lunch: 325 kcal, 13 grams protein Dinner: 249 kcal, 18 grams protein Supplements: No supplement documentation noted  Total intake: 660 kcal (31% of minimum estimated needs)  35 grams protein (29% of minimum estimated needs)   Day 2 Results (12/4) Breakfast: no meal documentation available Lunch: 176 kcal, 6 grams protein Dinner: no meal documentation available Supplements: no supplement documentation available  Total intake: 176 kcal (8% of minimum estimated needs)  6 grams protein (5% of minimum estimated needs)   Day 3 Results (12/5) Breakfast: 220 kcals, 9 grams protein Lunch: no meal documentation available  Dinner: 158 kcals, 7 grams protein Supplements: 1 Glucerna shake - provides 220 kcal and 10 grams of protein  Total intake: 598 kcal (28% of minimum estimated needs)  26 grams protein (21% of minimum estimated needs)   Meal documentation still incomplete from nursing staff. Discussed pt with RN today (who had pt on 12/3 as well). RN today notes that pt consumed all of her supplements Friday and today and also states that pt has eaten 75-100% of her meals today. Per MAR, pt has been accepting all of her supplements throughout the weekend. Pt also reports consuming 100% of supplements.    Nutrition Dx: Inadequate oral intake related to inability to eat as evidenced by NPO status. -- Progressing, pt on carb modified diet  Goal: Patient will meet greater than or equal to 90% of their needs -- progressing   Intervention:  -Continue Glucerna Shake po TID, each supplement provides 220 kcal and 10 grams of protein -Continue Magic cup TID with meals, each supplement provides 290 kcal and 9 grams of protein -Continue Juven po BID for wound healing   Larkin Ina, MS, RD, LDN RD  pager number and weekend/on-call pager number located in [place].

## 2020-05-07 NOTE — Progress Notes (Signed)
HD#83 Subjective:  Overnight Events: No events overnight  Continues to have good p.o. intake.  She has had more cough and shortness of breath secondary to mucous plugging.  Otherwise patient denies any other symptoms at this time.  Objective:  Vital signs in last 24 hours: Vitals:   05/06/20 2000 05/07/20 0000 05/07/20 0150 05/07/20 0757  BP: 136/84 139/83  123/78  Pulse: (!) 121 (!) 110 100 100  Resp: 20  20 19   Temp:    98.3 F (36.8 C)  TempSrc:    Oral  SpO2: 100% 100% 100% 100%  Weight:      Height:       Supplemental O2: trach collar SpO2: 100 % O2 Flow Rate (L/min): 5 L/min FiO2 (%): 28 %   Physical Exam:  Physical Exam Constitutional:      Appearance: Normal appearance.  HENT:     Head: Normocephalic and atraumatic.  Eyes:     Extraocular Movements: Extraocular movements intact.  Cardiovascular:     Rate and Rhythm: Normal rate.     Pulses: Normal pulses.     Heart sounds: Normal heart sounds.  Pulmonary:     Effort: Pulmonary effort is normal.     Breath sounds: Normal breath sounds.  Abdominal:     General: Bowel sounds are normal.     Palpations: Abdomen is soft.     Tenderness: There is no abdominal tenderness.  Musculoskeletal:        General: Normal range of motion.     Cervical back: Normal range of motion.     Right lower leg: No edema.     Left lower leg: No edema.  Skin:    General: Skin is warm and dry.     Comments: Sacral wound -less purulent drainage today clean wound bases  Neurological:     Mental Status: She is alert and oriented to person, place, and time. Mental status is at baseline.  Psychiatric:        Mood and Affect: Mood normal.     Filed Weights   04/26/20 0417 05/02/20 0459  Weight: 105 kg 104.9 kg     Intake/Output Summary (Last 24 hours) at 05/07/2020 0812 Last data filed at 05/07/2020 0340 Gross per 24 hour  Intake 1069.7 ml  Output 0 ml  Net 1069.7 ml   Net IO Since Admission: 20,892.35 mL [05/07/20  0812]  Pertinent Labs: CBC Latest Ref Rng & Units 05/05/2020 05/04/2020 05/03/2020  WBC 4.0 - 10.5 K/uL 10.3 8.0 9.3  Hemoglobin 12.0 - 15.0 g/dL 7.8(L) 7.2(L) 7.7(L)  Hematocrit 36 - 46 % 23.1(L) 22.2(L) 23.9(L)  Platelets 150 - 400 K/uL 487(H) 439(H) 433(H)    CMP Latest Ref Rng & Units 05/07/2020 05/06/2020 05/05/2020  Glucose 70 - 99 mg/dL 87 59(L) 65(L)  BUN 6 - 20 mg/dL 46(H) 33(H) 67(H)  Creatinine 0.44 - 1.00 mg/dL 5.17(H) 3.04(H) 4.38(H)  Sodium 135 - 145 mmol/L 136 138 138  Potassium 3.5 - 5.1 mmol/L 4.8 4.2 4.2  Chloride 98 - 111 mmol/L 98 98 96(L)  CO2 22 - 32 mmol/L 27 27 27   Calcium 8.9 - 10.3 mg/dL 10.0 9.6 10.5(H)  Total Protein 6.5 - 8.1 g/dL - - -  Total Bilirubin 0.3 - 1.2 mg/dL - - -  Alkaline Phos 38 - 126 U/L - - -  AST 15 - 41 U/L - - -  ALT 0 - 44 U/L - - -    Imaging: No results found.  Assessment/Plan:   Principal Problem:   COVID-19 Active Problems:   End stage renal disease on dialysis (HCC)   Diabetes mellitus type 2 in obese (HCC)   OSA (obstructive sleep apnea)   HCAP (healthcare-associated pneumonia)   Acute respiratory failure with hypoxemia (HCC)   Encephalopathy acute   ARDS (adult respiratory distress syndrome) (HCC)   Pressure injury of skin   Aspiration into airway   Status post tracheostomy ([place])   Fever   Septic shock (HCC)   Cellulitis of buttock   Palliative care encounter   Sacral wound   Patient Summary: Wanda Peeters Alstonis a 49 y.o.with a pertinent PMH of OSA, morbid obesity, hypothyroidism, hypertension, GERD, ESRD, diabetes, asthma, who presented withshortness of breathand admitted for Covid pneumoniarequiring tracheostomy andcomplicated by prolonged hospital stay including subsequent MSSApneumoniaand necrotizing soft tissue infectionof her sacrum.  Chronic hypoxic respiratory failure s/p tracheostomy secondary to HQPRF-16 pneumonia complicated by MSSA pneumonia: Patient mains on 5 L supplemental O2 via trach  collar.  Patient has substantially more cough with mucus.  We will continue to try to wean supplemental O2 when possible -Date pulmonary critical care managing patient's trach -Appreciate respiratory therapy suctioning patient's trach for mucous plugs.    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  Patient states that she is tolerating the medication well with any side effects.  Patient's sacrum appears to be improving.  There is substantially less purulent drainage in the wound base appears pink and moist.  Surgery plans to put her back on a wound VAC if the drainage remains minimal.   -Continue wet-to-dry dressing changes with Dakin's -Continue meropenem with HD for 10 days -Continue pain management with oxycodone 10 mg every 6 hours and fentanyl every 2 hours as needed for severe pain/breakthrough/dressing changes  ESRD Patient has tunneled cath in place for HD -Continue HD per nephrology -Continue midodrine 10 mg 3 times daily with HD -Continue Aranespper nephrology   Depression/Anxiety: -Continue Seroquel nightly and Klonopin daily  Type 2 diabetes: Patient's blood sugars are still too low.  Will decrease patient's Lantus to 25 units daily. -Start 25 units of Lantus and sliding scale insulin.  High risk for malnutrition: Patient is eating well and denies any issues with PO intake.  -Trial of p.o. intake over the weekend -Calorie count. Appreciate RD's assistance  Abdominal pain: Improved with Maalox.   Dispo: Anticipated discharge toLTACpending provement.Diet:Normal BWG:YKZL,DJTT SVX:BLTJQZE Code:DNR PT/OT recs:LTAC,none. TOC recs:pending  Dispo: Anticipated discharge to [place] pending improvement  Lawerance Cruel, D.O.  Internal Medicine Resident, PGY-2 [facility]  Pager: [phone] 8:12 AM,  05/07/2020   Please contact the on call pager after 5 pm and on weekends at [phone].

## 2020-05-07 NOTE — Plan of Care (Signed)
  Problem: Education: Goal: Knowledge of risk factors and measures for prevention of condition will improve Outcome: Progressing   Problem: Coping: Goal: Psychosocial and spiritual needs will be supported Outcome: Progressing   Problem: Respiratory: Goal: Will maintain a patent airway Outcome: Progressing Goal: Complications related to the disease process, condition or treatment will be avoided or minimized Outcome: Progressing   Problem: Education: Goal: Knowledge of General Education information will improve Description: Including pain rating scale, medication(s)/side effects and non-pharmacologic comfort measures Outcome: Progressing   Problem: Health Behavior/Discharge Planning: Goal: Ability to manage health-related needs will improve Outcome: Progressing   Problem: Clinical Measurements: Goal: Ability to maintain clinical measurements within normal limits will improve Outcome: Progressing Goal: Will remain free from infection Outcome: Progressing Goal: Diagnostic test results will improve Outcome: Progressing Goal: Respiratory complications will improve Outcome: Progressing Goal: Cardiovascular complication will be avoided Outcome: Progressing   Problem: Activity: Goal: Risk for activity intolerance will decrease Outcome: Progressing   Problem: Nutrition: Goal: Adequate nutrition will be maintained Outcome: Progressing   Problem: Coping: Goal: Level of anxiety will decrease Outcome: Progressing   Problem: Elimination: Goal: Will not experience complications related to bowel motility Outcome: Progressing   Problem: Pain Managment: Goal: General experience of comfort will improve Outcome: Progressing   Problem: Safety: Goal: Ability to remain free from injury will improve Outcome: Progressing   Problem: Skin Integrity: Goal: Risk for impaired skin integrity will decrease Outcome: Progressing   Problem: Activity: Goal: Ability to tolerate increased  activity will improve Outcome: Progressing   Problem: Respiratory: Goal: Ability to maintain a clear airway and adequate ventilation will improve Outcome: Progressing   Problem: Role Relationship: Goal: Method of communication will improve Outcome: Progressing   Problem: Education: Goal: Knowledge of disease and its progression will improve Outcome: Progressing Goal: Individualized Educational Video(s) Outcome: Progressing   Problem: Fluid Volume: Goal: Compliance with measures to maintain balanced fluid volume will improve Outcome: Progressing   Problem: Health Behavior/Discharge Planning: Goal: Ability to manage health-related needs will improve Outcome: Progressing   Problem: Nutritional: Goal: Ability to make healthy dietary choices will improve Outcome: Progressing   Problem: Clinical Measurements: Goal: Complications related to the disease process, condition or treatment will be avoided or minimized Outcome: Progressing   Problem: Activity: Goal: Ability to tolerate increased activity will improve Outcome: Progressing   Problem: Respiratory: Goal: Ability to maintain a clear airway and adequate ventilation will improve Outcome: Progressing   Problem: Role Relationship: Goal: Method of communication will improve Outcome: Progressing

## 2020-05-07 NOTE — Progress Notes (Signed)
OT Cancellation Note  Patient Details Name: Wanda Ramirez MRN: [id number] DOB: 06-18-1970   Cancelled Treatment:    Reason Eval/Treat Not Completed: Other (comment) (Pt asleep, RN asked not to wake - Pt had an AMAZING PT session this morning and stood multiple times from the bed)  Jaci Carrel 05/07/2020, 4:11 PM   Jesse Sans OTR/L Acute Rehabilitation Services Pager: [phone] Office: [phone]

## 2020-05-07 NOTE — Progress Notes (Signed)
34 Days Post-Op  Subjective: Doing well.  No complaints.  ROS: See above, otherwise other systems negative  Objective: Vital signs in last 24 hours: Temp:  [98.2 F (36.8 C)-99 F (37.2 C)] 98.3 F (36.8 C) (12/06 0757) Pulse Rate:  [100-121] 100 (12/06 0757) Resp:  [11-20] 19 (12/06 0757) BP: (123-144)/(78-88) 123/78 (12/06 0757) SpO2:  [100 %] 100 % (12/06 0757) FiO2 (%):  [28 %] 28 % (12/06 0150) Last BM Date: 05/01/20  Intake/Output from previous day: 12/05 0701 - 12/06 0700 In: 1069.7 [P.O.:960; I.V.:20; IV Piggyback:89.7] Out: 0  Intake/Output this shift: No intake/output data recorded.  PE: Buttock: wound is essentially 100% clean.  No further purulent drainage or tunneling in the superior aspect of the wound.       Lab Results:  Recent Labs    05/05/20 0516  WBC 10.3  HGB 7.8*  HCT 23.1*  PLT 487*   BMET Recent Labs    05/06/20 0057 05/07/20 0310  NA 138 136  K 4.2 4.8  CL 98 98  CO2 27 27  GLUCOSE 59* 87  BUN 33* 46*  CREATININE 3.04* 5.17*  CALCIUM 9.6 10.0   PT/INR No results for input(s): LABPROT, INR in the last 72 hours. CMP     Component Value Date/Time   NA 136 05/07/2020 0310   K 4.8 05/07/2020 0310   CL 98 05/07/2020 0310   CO2 27 05/07/2020 0310   GLUCOSE 87 05/07/2020 0310   BUN 46 (H) 05/07/2020 0310   CREATININE 5.17 (H) 05/07/2020 0310   CALCIUM 10.0 05/07/2020 0310   PROT 5.9 (L) 04/13/2020 0500   ALBUMIN 1.8 (L) 05/07/2020 0310   AST 25 04/13/2020 0500   ALT 25 04/13/2020 0500   ALKPHOS 316 (H) 04/13/2020 0500   BILITOT 0.8 04/13/2020 0500   GFRNONAA 10 (L) 05/07/2020 0310   GFRAA 33 (L) 03/05/2020 1703   Lipase  No results found for: LIPASE     Studies/Results: No results found.  Anti-infectives: Anti-infectives (From admission, onward)   Start     Dose/Rate Route Frequency Ordered Stop   05/03/20 2000  ampicillin (OMNIPEN) 2 g in sodium chloride 0.9 % 100 mL IVPB  Status:  Discontinued        2  g 300 mL/hr over 20 Minutes Intravenous Every 24 hours 05/03/20 1610 05/03/20 1629   05/03/20 1800  meropenem (MERREM) 500 mg in sodium chloride 0.9 % 100 mL IVPB        500 mg 200 mL/hr over 30 Minutes Intravenous Every 24 hours 05/03/20 1610 05/13/20 1759   05/03/20 1800  ampicillin (OMNIPEN) 2 g in sodium chloride 0.9 % 100 mL IVPB  Status:  Discontinued        2 g 300 mL/hr over 20 Minutes Intravenous Every 24 hours 05/03/20 1629 05/04/20 0831   04/12/20 1800  meropenem (MERREM) 500 mg in sodium chloride 0.9 % 100 mL IVPB  Status:  Discontinued        500 mg 200 mL/hr over 30 Minutes Intravenous Every 24 hours 04/12/20 1746 04/14/20 1426   04/06/20 1200  vancomycin (VANCOCIN) IVPB 1000 mg/200 mL premix        1,000 mg 200 mL/hr over 60 Minutes Intravenous Every Fri (Hemodialysis) 04/05/20 1529 04/06/20 1300   04/04/20 1200  vancomycin (VANCOCIN) IVPB 1000 mg/200 mL premix        1,000 mg 200 mL/hr over 60 Minutes Intravenous Every M-W-F (Hemodialysis) 04/04/20 1014 04/04/20 1423  04/03/20 0000  piperacillin-tazobactam (ZOSYN) IVPB 2.25 g  Status:  Discontinued        2.25 g 100 mL/hr over 30 Minutes Intravenous Every 8 hours 04/02/20 2253 04/09/20 1559   04/02/20 2345  vancomycin (VANCOCIN) IVPB 1000 mg/200 mL premix        1,000 mg 200 mL/hr over 60 Minutes Intravenous  Once 04/02/20 2251 04/03/20 0236   04/02/20 2251  vancomycin variable dose per unstable renal function (pharmacist dosing)  Status:  Discontinued         Does not apply See admin instructions 04/02/20 2251 04/07/20 1037   04/02/20 1200  ceFAZolin (ANCEF) IVPB 1 g/50 mL premix  Status:  Discontinued        1 g 100 mL/hr over 30 Minutes Intravenous Every 24 hours 04/02/20 1030 04/02/20 2251   04/01/20 1300  vancomycin (VANCOCIN) 2,000 mg in sodium chloride 0.9 % 500 mL IVPB  Status:  Discontinued        2,000 mg 250 mL/hr over 120 Minutes Intravenous  Once 04/01/20 1203 04/01/20 1203   04/01/20 1300  vancomycin  (VANCOREADY) IVPB 2000 mg/400 mL        2,000 mg 200 mL/hr over 120 Minutes Intravenous Every 12 hours 04/01/20 1205 04/01/20 1715   04/01/20 1300  vancomycin (VANCOCIN) 2,000 mg in sodium chloride 0.9 % 500 mL IVPB  Status:  Discontinued        2,000 mg 250 mL/hr over 120 Minutes Intravenous  Once 04/01/20 1207 04/01/20 1208   04/01/20 1245  vancomycin (VANCOCIN) 2,000 mg in sodium chloride 0.9 % 500 mL IVPB  Status:  Discontinued        2,000 mg 250 mL/hr over 120 Minutes Intravenous  Once 04/01/20 1154 04/01/20 1206   04/01/20 1154  vancomycin variable dose per unstable renal function (pharmacist dosing)  Status:  Discontinued         Does not apply See admin instructions 04/01/20 1154 04/02/20 1030   03/11/20 1200  cefTRIAXone (ROCEPHIN) 2 g in sodium chloride 0.9 % 100 mL IVPB        2 g 200 mL/hr over 30 Minutes Intravenous Every 24 hours 03/11/20 1107 03/17/20 1307   03/10/20 1200  piperacillin-tazobactam (ZOSYN) IVPB 3.375 g  Status:  Discontinued        3.375 g 12.5 mL/hr over 240 Minutes Intravenous Every 12 hours 03/10/20 1117 03/10/20 1245   03/10/20 1130  vancomycin (VANCOCIN) IVPB 1000 mg/200 mL premix        1,000 mg 200 mL/hr over 60 Minutes Intravenous  Once 03/10/20 1117 03/10/20 1258   03/10/20 1000  vancomycin variable dose per unstable renal function (pharmacist dosing)  Status:  Discontinued         Does not apply See admin instructions 03/09/20 1412 03/14/20 0810   03/09/20 2300  piperacillin-tazobactam (ZOSYN) IVPB 2.25 g  Status:  Discontinued        2.25 g 100 mL/hr over 30 Minutes Intravenous Every 8 hours 03/09/20 1412 03/10/20 1117   03/09/20 1415  piperacillin-tazobactam (ZOSYN) IVPB 3.375 g        3.375 g 100 mL/hr over 30 Minutes Intravenous  Once 03/09/20 1412 03/09/20 1537   03/09/20 1415  vancomycin (VANCOCIN) 2,250 mg in sodium chloride 0.9 % 500 mL IVPB        2,250 mg 250 mL/hr over 120 Minutes Intravenous  Once 03/09/20 1412 03/09/20 1639    02/28/20 2200  ceFEPIme (MAXIPIME) 1 g in sodium chloride 0.9 %  100 mL IVPB        1 g 200 mL/hr over 30 Minutes Intravenous Every 12 hours 02/28/20 1305 02/29/20 2220   02/28/20 0907  ceFEPIme (MAXIPIME) 1 g in sodium chloride 0.9 % 100 mL IVPB  Status:  Discontinued        1 g 200 mL/hr over 30 Minutes Intravenous Every 24 hours 02/27/20 0939 02/28/20 1305   02/26/20 2200  ceFEPIme (MAXIPIME) 2 g in sodium chloride 0.9 % 100 mL IVPB  Status:  Discontinued        2 g 200 mL/hr over 30 Minutes Intravenous Every 12 hours 02/26/20 1519 02/27/20 0939   02/25/20 1200  ceFEPIme (MAXIPIME) 2 g in sodium chloride 0.9 % 100 mL IVPB  Status:  Discontinued        2 g 200 mL/hr over 30 Minutes Intravenous Every T-Th-Sa (Hemodialysis) 02/25/20 0955 02/26/20 1519   02/24/20 1904  azithromycin (ZITHROMAX) 500 mg in sodium chloride 0.9 % 250 mL IVPB  Status:  Discontinued        500 mg 250 mL/hr over 60 Minutes Intravenous Every 24 hours 02/24/20 1904 02/26/20 0815   02/24/20 1900  azithromycin (ZITHROMAX) 250 mg in dextrose 5 % 125 mL IVPB  Status:  Discontinued        250 mg 125 mL/hr over 60 Minutes Intravenous Every 24 hours 02/24/20 1845 02/24/20 1904   02/24/20 1800  ceFEPIme (MAXIPIME) 2 g in sodium chloride 0.9 % 100 mL IVPB  Status:  Discontinued        2 g 200 mL/hr over 30 Minutes Intravenous Every Fri (Hemodialysis) 02/24/20 1326 02/25/20 0942   02/22/20 1745  ceFAZolin (ANCEF) IVPB 2g/100 mL premix        2 g 200 mL/hr over 30 Minutes Intravenous  Once 02/22/20 1744 02/22/20 1931   02/22/20 0600  ceFAZolin (ANCEF) IVPB 2g/100 mL premix        2 g 200 mL/hr over 30 Minutes Intravenous To Radiology 02/21/20 1658 02/22/20 1900   02/22/20 0000  ceFAZolin (ANCEF) IVPB 1 g/50 mL premix  Status:  Discontinued        1 g 100 mL/hr over 30 Minutes Intravenous To Radiology 02/21/20 1645 02/21/20 1658   02/21/20 1800  azithromycin (ZITHROMAX) tablet 250 mg  Status:  Discontinued       "Followed by"  Linked Group Details   250 mg Oral Daily-1800 02/20/20 1658 02/24/20 1852   02/21/20 1800  ceFEPIme (MAXIPIME) 2 g in sodium chloride 0.9 % 100 mL IVPB  Status:  Discontinued        2 g 200 mL/hr over 30 Minutes Intravenous Every T-Th-Sa (1800) 02/20/20 1737 02/24/20 1326   02/20/20 1800  azithromycin (ZITHROMAX) tablet 500 mg       "Followed by" Linked Group Details   500 mg Oral Daily 02/20/20 1658 02/20/20 1731   02/20/20 1745  ceFEPIme (MAXIPIME) 1 g in sodium chloride 0.9 % 100 mL IVPB        1 g 200 mL/hr over 30 Minutes Intravenous STAT 02/20/20 1737 02/21/20 0747   02/19/20 1145  remdesivir 100 mg in sodium chloride 0.9 % 100 mL IVPB        100 mg 200 mL/hr over 30 Minutes Intravenous Daily 02/19/20 1131 02/19/20 1443   02/15/20 1000  remdesivir 100 mg in sodium chloride 0.9 % 100 mL IVPB  Status:  Discontinued       "Followed by" Linked Group Details   100  mg 200 mL/hr over 30 Minutes Intravenous Daily 02/14/20 0944 02/14/20 0951   02/15/20 1000  remdesivir 100 mg in sodium chloride 0.9 % 100 mL IVPB        100 mg 200 mL/hr over 30 Minutes Intravenous Daily 02/14/20 0952 02/17/20 1102   02/14/20 1030  remdesivir 100 mg in sodium chloride 0.9 % 100 mL IVPB        100 mg 200 mL/hr over 30 Minutes Intravenous Every 30 min 02/14/20 3335 02/14/20 1129   02/14/20 0945  remdesivir 200 mg in sodium chloride 0.9% 250 mL IVPB  Status:  Discontinued       "Followed by" Linked Group Details   200 mg 580 mL/hr over 30 Minutes Intravenous Once 02/14/20 0944 02/14/20 0951       Assessment/Plan Morbid obesityBMI 42.02 Hypothyroidism HTN GERD ESRDon HD DM Asthma Chronic respiratory failure 2/2 Covid-19 (diagnosed 02/09/20) S/p tracheostomy Code status DNR  Necrotizing soft tissue infection S/pincision and debridement of sacrum, left gluteus11/2 Dr. Bobbye Morton - afebrile, WBC WNL - wound is very clean.  No further purulent drainage. -will stop Dakin's solution to wound as it  is no longer needed -cont BID dressing changes with NS -will recheck mid week.  If remains clean with no further drainage, will likely replace VAC   LOS: 62 days    Wanda Ramirez , [facility] 05/07/2020, 8:07 AM Please see Amion for pager number during day hours 7:00am-4:30pm or 7:00am -11:30am on weekends

## 2020-05-07 NOTE — Progress Notes (Signed)
Palliative Medicine RN Note: Discussed pt in team rounds. GOC are clear, no further role for PMT.   At this time, we will sign off. Please re-consult if patient or her family requests to speak to our team again.  Marjie Skiff Gaye Scorza, RN, BSN, [facility] Palliative Medicine Team 05/07/2020 4:21 PM Office [phone]

## 2020-05-08 LAB — RENAL FUNCTION PANEL
Albumin: 1.9 g/dL — ABNORMAL LOW (ref 3.5–5.0)
Anion gap: 15 (ref 5–15)
BUN: 69 mg/dL — ABNORMAL HIGH (ref 6–20)
CO2: 24 mmol/L (ref 22–32)
Calcium: 10.2 mg/dL (ref 8.9–10.3)
Chloride: 97 mmol/L — ABNORMAL LOW (ref 98–111)
Creatinine, Ser: 6.74 mg/dL — ABNORMAL HIGH (ref 0.44–1.00)
GFR, Estimated: 7 mL/min — ABNORMAL LOW (ref >60)
Glucose, Bld: 97 mg/dL (ref 70–99)
Phosphorus: 5.8 mg/dL — ABNORMAL HIGH (ref 2.5–4.6)
Potassium: 5.8 mmol/L — ABNORMAL HIGH (ref 3.5–5.1)
Sodium: 136 mmol/L (ref 135–145)

## 2020-05-08 LAB — GLUCOSE, CAPILLARY
Glucose-Capillary: 93 mg/dL (ref 70–99)
Glucose-Capillary: 87 mg/dL (ref 70–99)
Glucose-Capillary: 107 mg/dL — ABNORMAL HIGH (ref 70–99)
Glucose-Capillary: 226 mg/dL — ABNORMAL HIGH (ref 70–99)
Glucose-Capillary: 180 mg/dL — ABNORMAL HIGH (ref 70–99)

## 2020-05-08 LAB — CBC
HCT: 25.2 % — ABNORMAL LOW (ref 36.0–46.0)
Hemoglobin: 8.1 g/dL — ABNORMAL LOW (ref 12.0–15.0)
MCH: 28.5 pg (ref 26.0–34.0)
MCHC: 32.1 g/dL (ref 30.0–36.0)
MCV: 88.7 fL (ref 80.0–100.0)
Platelets: 577 10*3/uL — ABNORMAL HIGH (ref 150–400)
RBC: 2.84 MIL/uL — ABNORMAL LOW (ref 3.87–5.11)
RDW: 15.8 % — ABNORMAL HIGH (ref 11.5–15.5)
WBC: 9.8 10*3/uL (ref 4.0–10.5)
nRBC: 0 % (ref 0.0–0.2)

## 2020-05-08 MED ORDER — HEPARIN SODIUM (PORCINE) 1000 UNIT/ML DIALYSIS
20.0000 [IU]/kg | INTRAMUSCULAR | Status: DC | PRN
  Administered 2020-05-08: 4200 [IU] via INTRAVENOUS_CENTRAL
  Filled 2020-05-08 (×2): qty 3

## 2020-05-08 MED ORDER — LIDOCAINE-PRILOCAINE 2.5-2.5 % EX CREA
1.0000 "application " | TOPICAL_CREAM | CUTANEOUS | Status: DC | PRN
  Filled 2020-05-08: qty 5

## 2020-05-08 MED ORDER — SODIUM CHLORIDE 0.9 % IV SOLN: 100.0000 mL | INTRAVENOUS | Status: DC | PRN

## 2020-05-08 MED ORDER — LIDOCAINE HCL (PF) 1 % IJ SOLN: 5.0000 mL | INTRAMUSCULAR | Status: DC | PRN

## 2020-05-08 MED ORDER — HEPARIN SODIUM (PORCINE) 1000 UNIT/ML IJ SOLN
INTRAMUSCULAR | Status: AC
  Filled 2020-05-08: qty 4

## 2020-05-08 MED ORDER — ALTEPLASE 2 MG IJ SOLR
2.0000 mg | Freq: Once | INTRAMUSCULAR | Status: DC | PRN
  Filled 2020-05-08: qty 2

## 2020-05-08 MED ORDER — PENTAFLUOROPROP-TETRAFLUOROETH EX AERO: 1.0000 "application " | INHALATION_SPRAY | CUTANEOUS | Status: DC | PRN

## 2020-05-08 MED ORDER — HEPARIN SODIUM (PORCINE) 1000 UNIT/ML DIALYSIS
1000.0000 [IU] | INTRAMUSCULAR | Status: DC | PRN
  Filled 2020-05-08: qty 1

## 2020-05-08 NOTE — Progress Notes (Signed)
PT Cancellation Note  Patient Details Name: Wanda Ramirez MRN: [id number] DOB: 03-14-1971   Cancelled Treatment:    Reason Eval/Treat Not Completed: Patient at procedure or test/unavailable (pt leaving for HD)   [place] 05/08/2020, 7:23 AM  Bayard Males, PT Acute Rehabilitation Services Pager: [phone] Office: [phone]

## 2020-05-08 NOTE — Progress Notes (Signed)
Pt was not available during 0800 and 1200 rounds due to being in dialysis.

## 2020-05-08 NOTE — Progress Notes (Addendum)
   05/08/20 2227  Provider Notification  Provider Name/Title Allyson Sabal  Date Provider Notified 05/08/20  Time Provider Notified 2227  Notification Type Page  Notification Reason Other (Comment) (insulin order clarification)  Response Other (Comment)  Date of Provider Response 05/08/20  Time of Provider Response 2330  Called provider to clarify long acting insulin orders. On 12/5, patient's CBG was 180 at bedtime. Patient received sliding scale coverage and Lantus. By morning, CBG 65. On 12/6, patient's CBG at bedtime was 96. Called this provider last night to clarify insulin orders and Lantus was decreased to 25 units instead of 32. However, order was placed to start 12/7 instead of 12/6. Patient never received Lantus last night and CBG by morning was 93. Highest CBG today was 107. Patient had late dinner and one hour after, patient's CBG 266. No sliding scale coverage given and CBG rechecked another hour later and was 180. Patient states that she does not take long acting insulin at home and only takes 3 units in the morning and 6 units at night but unsure what kind of insulin. Patient's A1C on admission was 6.1. Provider instructed to hold Lantus tonight and page with 0400 CBG results to pass along to primary day shift team.   At 0022 CBG 64 a little over an hour after receiving 3 units of Novolog per sliding scale. A cup of juice with one packet of sweetener added was given to patient. CBG re-check at 0101 was 99. Provider paged with above note. No return call recieved.  0407: Paged provider with update: 0022 CBG 1 hour after receiving 3u novolog (64), 0100 CBG After juice+sugar (99), 0400 CBG (103).

## 2020-05-08 NOTE — Plan of Care (Signed)
  Problem: Education: Goal: Knowledge of risk factors and measures for prevention of condition will improve Outcome: Progressing   Problem: Coping: Goal: Psychosocial and spiritual needs will be supported Outcome: Progressing   Problem: Respiratory: Goal: Will maintain a patent airway Outcome: Progressing Goal: Complications related to the disease process, condition or treatment will be avoided or minimized Outcome: Progressing   Problem: Education: Goal: Knowledge of General Education information will improve Description: Including pain rating scale, medication(s)/side effects and non-pharmacologic comfort measures Outcome: Progressing   Problem: Health Behavior/Discharge Planning: Goal: Ability to manage health-related needs will improve Outcome: Progressing   Problem: Clinical Measurements: Goal: Ability to maintain clinical measurements within normal limits will improve Outcome: Progressing Goal: Will remain free from infection Outcome: Progressing Goal: Diagnostic test results will improve Outcome: Progressing Goal: Respiratory complications will improve Outcome: Progressing Goal: Cardiovascular complication will be avoided Outcome: Progressing   Problem: Activity: Goal: Risk for activity intolerance will decrease Outcome: Progressing   Problem: Nutrition: Goal: Adequate nutrition will be maintained Outcome: Progressing   Problem: Coping: Goal: Level of anxiety will decrease Outcome: Progressing   Problem: Elimination: Goal: Will not experience complications related to bowel motility Outcome: Progressing   Problem: Pain Managment: Goal: General experience of comfort will improve Outcome: Progressing   Problem: Safety: Goal: Ability to remain free from injury will improve Outcome: Progressing   Problem: Skin Integrity: Goal: Risk for impaired skin integrity will decrease Outcome: Progressing   Problem: Activity: Goal: Ability to tolerate increased  activity will improve Outcome: Progressing   Problem: Respiratory: Goal: Ability to maintain a clear airway and adequate ventilation will improve Outcome: Progressing   Problem: Role Relationship: Goal: Method of communication will improve Outcome: Progressing   Problem: Education: Goal: Knowledge of disease and its progression will improve Outcome: Progressing Goal: Individualized Educational Video(s) Outcome: Progressing   Problem: Fluid Volume: Goal: Compliance with measures to maintain balanced fluid volume will improve Outcome: Progressing   Problem: Health Behavior/Discharge Planning: Goal: Ability to manage health-related needs will improve Outcome: Progressing   Problem: Nutritional: Goal: Ability to make healthy dietary choices will improve Outcome: Progressing   Problem: Clinical Measurements: Goal: Complications related to the disease process, condition or treatment will be avoided or minimized Outcome: Progressing   Problem: Activity: Goal: Ability to tolerate increased activity will improve Outcome: Progressing   Problem: Respiratory: Goal: Ability to maintain a clear airway and adequate ventilation will improve Outcome: Progressing   Problem: Role Relationship: Goal: Method of communication will improve Outcome: Progressing

## 2020-05-08 NOTE — Progress Notes (Signed)
HD#84 Subjective:  Overnight Events: No events overnight  Seen in HD this morning. Feels like her breathing has improved. Endorses good appetite and PO intake.   Objective:  Vital signs in last 24 hours: Vitals:   05/08/20 1227 05/08/20 1413 05/08/20 1510 05/08/20 1630  BP: 123/70   (!) 124/55  Pulse:   (!) 112 96  Resp: 16  (!) 24 17  Temp: 97.8 F (36.6 C)   98.6 F (37 C)  TempSrc: Oral   Oral  SpO2: 100% 98% 98% 100%  Weight:      Height:       Supplemental O2: trach collar SpO2: 100 % O2 Flow Rate (L/min): 5 L/min FiO2 (%): 28 %   Physical Exam:   General: awake, alert, chronically ill appearing, lying comfortably while receiving HD Pulm: breathing comfortably, speaking fluently with PMV Abd: non-distended   Filed Weights   04/26/20 0417 05/02/20 0459  Weight: 105 kg 104.9 kg     Intake/Output Summary (Last 24 hours) at 05/08/2020 1915 Last data filed at 05/08/2020 0705 Gross per 24 hour  Intake 338 ml  Output --  Net 338 ml   Net IO Since Admission: 22,321.35 mL [05/08/20 1915]  Pertinent Labs: CBC Latest Ref Rng & Units 05/08/2020 05/07/2020 05/05/2020  WBC 4.0 - 10.5 K/uL 9.8 8.0 10.3  Hemoglobin 12.0 - 15.0 g/dL 8.1(L) 7.4(L) 7.8(L)  Hematocrit 36 - 46 % 25.2(L) 24.3(L) 23.1(L)  Platelets 150 - 400 K/uL 577(H) 533(H) 487(H)    CMP Latest Ref Rng & Units 05/08/2020 05/07/2020 05/06/2020  Glucose 70 - 99 mg/dL 97 87 59(L)  BUN 6 - 20 mg/dL 69(H) 46(H) 33(H)  Creatinine 0.44 - 1.00 mg/dL 6.74(H) 5.17(H) 3.04(H)  Sodium 135 - 145 mmol/L 136 136 138  Potassium 3.5 - 5.1 mmol/L 5.8(H) 4.8 4.2  Chloride 98 - 111 mmol/L 97(L) 98 98  CO2 22 - 32 mmol/L 24 27 27   Calcium 8.9 - 10.3 mg/dL 10.2 10.0 9.6  Total Protein 6.5 - 8.1 g/dL - - -  Total Bilirubin 0.3 - 1.2 mg/dL - - -  Alkaline Phos 38 - 126 U/L - - -  AST 15 - 41 U/L - - -  ALT 0 - 44 U/L - - -    Imaging: No results found.  Assessment/Plan:   Principal Problem:   COVID-19 Active  Problems:   End stage renal disease on dialysis (HCC)   Diabetes mellitus type 2 in obese (HCC)   OSA (obstructive sleep apnea)   HCAP (healthcare-associated pneumonia)   Acute respiratory failure with hypoxemia (HCC)   Encephalopathy acute   ARDS (adult respiratory distress syndrome) (HCC)   Pressure injury of skin   Aspiration into airway   Status post tracheostomy ([place])   Fever   Septic shock (HCC)   Cellulitis of buttock   Palliative care encounter   Sacral wound   Patient Summary: Wanda Mcclaskey Alstonis a 49 y.o.with a pertinent PMH of OSA, morbid obesity, hypothyroidism, hypertension, GERD, ESRD, diabetes, asthma, who presented withshortness of breathand admitted for Covid pneumoniarequiring tracheostomy andcomplicated by prolonged hospital stay including subsequent MSSApneumoniaand necrotizing soft tissue infectionof her sacrum.  Chronic hypoxic respiratory failure s/p tracheostomy secondary to TOIZT-24 pneumonia complicated by MSSA pneumonia: Patient mains on 5 L supplemental O2 via trach collar. Continue suctioning prn.  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  Patient states that she is tolerating the medication well with any side effects.  Sacral wound has improved since antibiotic initiation. Surgery plans to put her back on a wound VAC if the drainage remains minimal.   -Continue wet-to-dry dressing changes with Dakin's -Continue meropenem with HD for 10 days -Continue pain management with oxycodone 10 mg every 6 hours and fentanyl every 2 hours as needed for severe pain/breakthrough/dressing changes  ESRD Patient has tunneled cath in place for HD -Continue HD per nephrology -Continue midodrine 10 mg 3 times daily with HD -Continue Aranespper nephrology    Depression/Anxiety: -Continue Seroquel nightly and Klonopin daily  Type 2 diabetes: CBGs stable since decreasing long-acting insulin   High risk for malnutrition: Patient is eating well and denies any issues with PO intake.  -Calorie count. Appreciate RD's assistance  Abdominal pain: Improved with Maalox.   Dispo: Anticipated discharge toCIR  Diet:Normal FGH:WEXH,BZJI RCV:ELFYBOF Code:DNR PT/OT recs:LTAC,none. TOC recs:pending  Dispo: Anticipated discharge to CIR pending MD evaluation.   Modena Nunnery D, DO PGY-2 7:15 PM, 05/08/2020   Please contact the on call pager after 5 pm and on weekends at [phone].

## 2020-05-08 NOTE — Progress Notes (Signed)
Rehab Admissions Coordinator Note:  Patient was screened by Cleatrice Burke for appropriateness for an Inpatient Acute Rehab Consult.  At this time, we are recommending Inpatient Rehab consult. I will contact attending service for order.  Cleatrice Burke RN MSN 05/08/2020, 9:00 AM  I can be reached at [phone].

## 2020-05-08 NOTE — Plan of Care (Signed)
  Problem: Education: Goal: Knowledge of risk factors and measures for prevention of condition will improve Outcome: Progressing   

## 2020-05-08 NOTE — Progress Notes (Signed)
[facility] NEPHROLOGY PROGRESS NOTE  Assessment/ Plan: Pt is a 49 y.o. yo female  w/ ESRD on TTS HD admitted for COVID PNA on 02/13/20. She was intubated. SP CRRT around 9/26- 10/7. Now is on intermittent HD MWF here. She is sp trach. She had MSSA pna. DM2.Then she had large sacral decub requiring I&D.   OP HD:TTS  4h 46mn 450/800 2/2.25 bath 111.5kg Hep 2000 L AVF - darbe 50 ug q week, last 9/7 - calc 1.0 tiw - 9/9 Hb 10.0, tsat 22%  # COVID pna sp trach complicated by MSSA PNA - off vent on trach collar.  #Hypotension - BP's stable on midodrine 10 tid. Monitor BP.  #Stage IV sacral decub:- sp I&D x 2.  On meropenem. wound care, general surgery is following.  #ESRD - S/P CRRT, now HD TTS.   HD today, tolerating well.  #Anemia ckd/ critical illness - on darbe 150 weekly, 11/12 Ferritin >4000, iron sat 11. With active infection and ^^ ferritin no IV iron. tx prbc's PRN. Rechecked iron panel-- Ferritin 2777 and %sat 18, hold off on IV iron for now  #MBD ckd -monitor Ca and phos level, off binders. Monitor.   # HD access: LUA AVF clotted here during acute covid. TDC x2 per IR  #Dispo: Considering inpatient rehab.  Otherwise she will need LTAC because of trach and dialysis need.  Subjective: Seen and examined, tolerating dialysis well.  No new event.  Denies nausea vomiting chest pain. Objective Vital signs in last 24 hours: Vitals:   05/08/20 0833 05/08/20 0848 05/08/20 0903 05/08/20 0933  BP: 119/78 120/60 (!) 143/83 101/66  Pulse:      Resp: 18 20 (!) 22 15  Temp:      TempSrc:      SpO2:      Weight:      Height:       Weight change:   Intake/Output Summary (Last 24 hours) at 05/08/2020 1001 Last data filed at 05/08/2020 0705 Gross per 24 hour  Intake 812 ml  Output --  Net 812 ml       Labs: Basic Metabolic Panel: Recent Labs  Lab 05/06/20 0057 05/07/20 0310 05/08/20 0200  NA 138 136 136  K 4.2 4.8 5.8*  CL 98 98 97*  CO2 '27  27 24  ' GLUCOSE 59* 87 97  BUN 33* 46* 69*  CREATININE 3.04* 5.17* 6.74*  CALCIUM 9.6 10.0 10.2  PHOS 2.6 5.1* 5.8*   Liver Function Tests: Recent Labs  Lab 05/06/20 0057 05/07/20 0310 05/08/20 0200  ALBUMIN 1.9* 1.8* 1.9*   No results for input(s): LIPASE, AMYLASE in the last 168 hours. No results for input(s): AMMONIA in the last 168 hours. CBC: Recent Labs  Lab 05/03/20 0339 05/03/20 0339 05/04/20 0524 05/04/20 0524 05/05/20 0516 05/07/20 0310 05/08/20 0718  WBC 9.3   < > 8.0   < > 10.3 8.0 9.8  HGB 7.7*   < > 7.2*   < > 7.8* 7.4* 8.1*  HCT 23.9*   < > 22.2*   < > 23.1* 24.3* 25.2*  MCV 87.9  --  86.7  --  86.5 89.7 88.7  PLT 433*   < > 439*   < > 487* 533* 577*   < > = values in this interval not displayed.   Cardiac Enzymes: No results for input(s): CKTOTAL, CKMB, CKMBINDEX, TROPONINI in the last 168 hours. CBG: Recent Labs  Lab 05/07/20 1129 05/07/20 1549 05/07/20 2050 05/07/20  2257 05/08/20 0416  GLUCAP 86 153* 96 101* 93    Iron Studies: No results for input(s): IRON, TIBC, TRANSFERRIN, FERRITIN in the last 72 hours. Studies/Results: No results found.  Medications: Infusions: . sodium chloride 250 mL (04/08/20 2040)  . sodium chloride    . sodium chloride    . meropenem (MERREM) IV 500 mg (05/07/20 1857)    Scheduled Medications: . B-complex with vitamin C  1 tablet Per Tube Daily  . chlorhexidine gluconate (MEDLINE KIT)  15 mL Mouth Rinse BID  . Chlorhexidine Gluconate Cloth  6 each Topical Daily  . clonazepam  0.25 mg Per Tube Daily  . collagenase   Topical Daily  . darbepoetin (ARANESP) injection - DIALYSIS  150 mcg Intravenous Q Thu-HD  . feeding supplement (GLUCERNA SHAKE)  237 mL Oral TID WC  . heparin injection (subcutaneous)  5,000 Units Subcutaneous Q8H  . heparin sodium (porcine)      . hydrocerin   Topical Daily  . insulin aspart  0-15 Units Subcutaneous Q4H  . insulin glargine  25 Units Subcutaneous Daily  . levothyroxine  125  mcg Per Tube Q0600  . midodrine  10 mg Per Tube TID WC  . multivitamin  1 tablet Per Tube QHS  . nutrition supplement (JUVEN)  1 packet Oral BID BM  . nystatin  5 mL Mouth/Throat QID  . oxyCODONE  10 mg Per Tube Q6H  . pantoprazole sodium  40 mg Per Tube Daily  . QUEtiapine  50 mg Per Tube QHS  . sodium chloride flush  10-40 mL Intracatheter Q12H    have reviewed scheduled and prn medications.  Physical Exam: General: Comfortable, has tracheostomy Heart:RRR, s1s2 nl Lungs: Bibasal rhonchi and some coarse breath sound Abdomen:soft, Non-tender, non-distended Extremities: Trace edema Dialysis Access: Left IJ tunnel HD catheter  Abaigeal Moomaw Tanna Furry 05/08/2020,10:01 AM  LOS: 84 days  Pager: [phone]

## 2020-05-08 NOTE — Progress Notes (Signed)
SLP Cancellation Note  Patient Details Name: Wanda Ramirez MRN: [id number] DOB: [birth year]   Cancelled treatment:       Reason Eval/Treat Not Completed: Patient at procedure or test/unavailable. Pt going to HD   Elana Jian, Katherene Ponto 05/08/2020, 7:52 AM

## 2020-05-09 LAB — RENAL FUNCTION PANEL
Albumin: 1.9 g/dL — ABNORMAL LOW (ref 3.5–5.0)
Anion gap: 12 (ref 5–15)
BUN: 34 mg/dL — ABNORMAL HIGH (ref 6–20)
CO2: 26 mmol/L (ref 22–32)
Calcium: 9 mg/dL (ref 8.9–10.3)
Chloride: 97 mmol/L — ABNORMAL LOW (ref 98–111)
Creatinine, Ser: 3.48 mg/dL — ABNORMAL HIGH (ref 0.44–1.00)
GFR, Estimated: 15 mL/min — ABNORMAL LOW (ref >60)
Glucose, Bld: 114 mg/dL — ABNORMAL HIGH (ref 70–99)
Phosphorus: 4.3 mg/dL (ref 2.5–4.6)
Potassium: 4.6 mmol/L (ref 3.5–5.1)
Sodium: 135 mmol/L (ref 135–145)

## 2020-05-09 LAB — GLUCOSE, CAPILLARY
Glucose-Capillary: 103 mg/dL — ABNORMAL HIGH (ref 70–99)
Glucose-Capillary: 106 mg/dL — ABNORMAL HIGH (ref 70–99)
Glucose-Capillary: 129 mg/dL — ABNORMAL HIGH (ref 70–99)
Glucose-Capillary: 163 mg/dL — ABNORMAL HIGH (ref 70–99)
Glucose-Capillary: 176 mg/dL — ABNORMAL HIGH (ref 70–99)
Glucose-Capillary: 211 mg/dL — ABNORMAL HIGH (ref 70–99)
Glucose-Capillary: 64 mg/dL — ABNORMAL LOW (ref 70–99)
Glucose-Capillary: 99 mg/dL (ref 70–99)

## 2020-05-09 MED ORDER — OXYCODONE HCL 5 MG PO TABS
10.0000 mg | ORAL_TABLET | Freq: Four times a day (QID) | ORAL | Status: DC
  Administered 2020-05-09 – 2020-05-15 (×23): 10 mg via ORAL
  Filled 2020-05-09 (×23): qty 2

## 2020-05-09 MED ORDER — POLYETHYLENE GLYCOL 3350 17 G PO PACK: 17.0000 g | PACK | Freq: Every day | ORAL | Status: DC | PRN

## 2020-05-09 MED ORDER — INSULIN GLARGINE 100 UNIT/ML ~~LOC~~ SOLN
3.0000 [IU] | Freq: Every day | SUBCUTANEOUS | Status: DC
  Administered 2020-05-09 – 2020-05-11 (×2): 3 [IU] via SUBCUTANEOUS
  Filled 2020-05-09 (×4): qty 0.03

## 2020-05-09 MED ORDER — RENA-VITE PO TABS
1.0000 | ORAL_TABLET | Freq: Every day | ORAL | Status: DC
  Administered 2020-05-09 – 2020-05-14 (×6): 1 via ORAL
  Filled 2020-05-09 (×6): qty 1

## 2020-05-09 MED ORDER — INSULIN GLARGINE 100 UNIT/ML ~~LOC~~ SOLN: 10.0000 [IU] | Freq: Every day | SUBCUTANEOUS | Status: DC

## 2020-05-09 MED ORDER — MIDODRINE HCL 5 MG PO TABS
10.0000 mg | ORAL_TABLET | Freq: Three times a day (TID) | ORAL | Status: DC
  Administered 2020-05-09 – 2020-05-15 (×14): 10 mg via ORAL
  Filled 2020-05-09 (×15): qty 2

## 2020-05-09 MED ORDER — B COMPLEX-C PO TABS
1.0000 | ORAL_TABLET | Freq: Every day | ORAL | Status: DC
  Administered 2020-05-10 – 2020-05-15 (×6): 1 via ORAL
  Filled 2020-05-09 (×6): qty 1

## 2020-05-09 MED ORDER — CHLORHEXIDINE GLUCONATE CLOTH 2 % EX PADS
6.0000 | MEDICATED_PAD | Freq: Every day | CUTANEOUS | Status: DC
  Administered 2020-05-09 – 2020-05-10 (×2): 6 via TOPICAL

## 2020-05-09 MED ORDER — INSULIN ASPART 100 UNIT/ML ~~LOC~~ SOLN
0.0000 [IU] | SUBCUTANEOUS | Status: DC
  Administered 2020-05-09: 2 [IU] via SUBCUTANEOUS
  Administered 2020-05-09 – 2020-05-11 (×4): 1 [IU] via SUBCUTANEOUS
  Administered 2020-05-11: 2 [IU] via SUBCUTANEOUS
  Administered 2020-05-12 (×2): 1 [IU] via SUBCUTANEOUS
  Administered 2020-05-12: 2 [IU] via SUBCUTANEOUS
  Administered 2020-05-13: 1 [IU] via SUBCUTANEOUS
  Administered 2020-05-13: 3 [IU] via SUBCUTANEOUS
  Administered 2020-05-13: 1 [IU] via SUBCUTANEOUS
  Administered 2020-05-14 (×2): 2 [IU] via SUBCUTANEOUS

## 2020-05-09 MED ORDER — QUETIAPINE FUMARATE 50 MG PO TABS
50.0000 mg | ORAL_TABLET | Freq: Every day | ORAL | Status: DC
  Administered 2020-05-09 – 2020-05-14 (×6): 50 mg via ORAL
  Filled 2020-05-09 (×6): qty 1

## 2020-05-09 MED ORDER — ACETAMINOPHEN 160 MG/5ML PO SOLN
650.0000 mg | Freq: Four times a day (QID) | ORAL | Status: DC | PRN
  Filled 2020-05-09: qty 20.3

## 2020-05-09 MED ORDER — INSULIN GLARGINE 100 UNIT/ML ~~LOC~~ SOLN
6.0000 [IU] | Freq: Every day | SUBCUTANEOUS | Status: DC
  Filled 2020-05-09: qty 0.06

## 2020-05-09 MED ORDER — LEVOTHYROXINE SODIUM 25 MCG PO TABS
125.0000 ug | ORAL_TABLET | Freq: Every day | ORAL | Status: DC
  Administered 2020-05-10 – 2020-05-15 (×6): 125 ug via ORAL
  Filled 2020-05-09 (×6): qty 1

## 2020-05-09 MED ORDER — PANTOPRAZOLE SODIUM 40 MG PO TBEC
40.0000 mg | DELAYED_RELEASE_TABLET | Freq: Every day | ORAL | Status: DC
  Administered 2020-05-09 – 2020-05-15 (×7): 40 mg via ORAL
  Filled 2020-05-09 (×7): qty 1

## 2020-05-09 MED ORDER — CLONAZEPAM 0.25 MG PO TBDP
0.2500 mg | ORAL_TABLET | Freq: Every day | ORAL | Status: DC
  Administered 2020-05-10 – 2020-05-15 (×6): 0.25 mg via ORAL
  Filled 2020-05-09 (×6): qty 1

## 2020-05-09 NOTE — Plan of Care (Signed)
  Problem: Education: Goal: Knowledge of risk factors and measures for prevention of condition will improve Outcome: Progressing   Problem: Coping: Goal: Psychosocial and spiritual needs will be supported Outcome: Progressing   Problem: Respiratory: Goal: Will maintain a patent airway Outcome: Progressing Goal: Complications related to the disease process, condition or treatment will be avoided or minimized Outcome: Progressing   Problem: Education: Goal: Knowledge of General Education information will improve Description: Including pain rating scale, medication(s)/side effects and non-pharmacologic comfort measures Outcome: Progressing   Problem: Health Behavior/Discharge Planning: Goal: Ability to manage health-related needs will improve Outcome: Progressing   Problem: Clinical Measurements: Goal: Ability to maintain clinical measurements within normal limits will improve Outcome: Progressing Goal: Will remain free from infection Outcome: Progressing Goal: Diagnostic test results will improve Outcome: Progressing Goal: Respiratory complications will improve Outcome: Progressing Goal: Cardiovascular complication will be avoided Outcome: Progressing   Problem: Activity: Goal: Risk for activity intolerance will decrease Outcome: Progressing   Problem: Nutrition: Goal: Adequate nutrition will be maintained Outcome: Progressing   Problem: Coping: Goal: Level of anxiety will decrease Outcome: Progressing   Problem: Elimination: Goal: Will not experience complications related to bowel motility Outcome: Progressing   Problem: Pain Managment: Goal: General experience of comfort will improve Outcome: Progressing   Problem: Safety: Goal: Ability to remain free from injury will improve Outcome: Progressing   Problem: Skin Integrity: Goal: Risk for impaired skin integrity will decrease Outcome: Progressing   Problem: Activity: Goal: Ability to tolerate increased  activity will improve Outcome: Progressing   Problem: Respiratory: Goal: Ability to maintain a clear airway and adequate ventilation will improve Outcome: Progressing   Problem: Role Relationship: Goal: Method of communication will improve Outcome: Progressing   Problem: Education: Goal: Knowledge of disease and its progression will improve Outcome: Progressing Goal: Individualized Educational Video(s) Outcome: Progressing   Problem: Fluid Volume: Goal: Compliance with measures to maintain balanced fluid volume will improve Outcome: Progressing   Problem: Health Behavior/Discharge Planning: Goal: Ability to manage health-related needs will improve Outcome: Progressing   Problem: Nutritional: Goal: Ability to make healthy dietary choices will improve Outcome: Progressing   Problem: Clinical Measurements: Goal: Complications related to the disease process, condition or treatment will be avoided or minimized Outcome: Progressing   Problem: Activity: Goal: Ability to tolerate increased activity will improve Outcome: Progressing   Problem: Respiratory: Goal: Ability to maintain a clear airway and adequate ventilation will improve Outcome: Progressing   Problem: Role Relationship: Goal: Method of communication will improve Outcome: Progressing

## 2020-05-09 NOTE — Progress Notes (Signed)
36 Days Post-Op  Subjective: CC: Wound vac applied by WOCN this AM. Patient is doing well. No complaints.   Objective: Vital signs in last 24 hours: Temp:  [97.8 F (36.6 C)-98.6 F (37 C)] 98.1 F (36.7 C) (12/08 0733) Pulse Rate:  [96-113] 100 (12/08 0733) Resp:  [12-27] 19 (12/08 0733) BP: (101-145)/(55-103) 119/72 (12/08 0733) SpO2:  [97 %-100 %] 97 % (12/08 0733) FiO2 (%):  [28 %] 28 % (12/08 0413) Last BM Date: 05/01/20  Intake/Output from previous day: 12/07 0701 - 12/08 0700 In: 337 [P.O.:337] Out: -  Intake/Output this shift: No intake/output data recorded.  PE: Buttock wound: Wound vac in place. Please see WOCN for description of wound.   Lab Results:  Recent Labs    05/07/20 0310 05/08/20 0718  WBC 8.0 9.8  HGB 7.4* 8.1*  HCT 24.3* 25.2*  PLT 533* 577*   BMET Recent Labs    05/08/20 0200 05/09/20 0356  NA 136 135  K 5.8* 4.6  CL 97* 97*  CO2 24 26  GLUCOSE 97 114*  BUN 69* 34*  CREATININE 6.74* 3.48*  CALCIUM 10.2 9.0   PT/INR No results for input(s): LABPROT, INR in the last 72 hours. CMP     Component Value Date/Time   NA 135 05/09/2020 0356   K 4.6 05/09/2020 0356   CL 97 (L) 05/09/2020 0356   CO2 26 05/09/2020 0356   GLUCOSE 114 (H) 05/09/2020 0356   BUN 34 (H) 05/09/2020 0356   CREATININE 3.48 (H) 05/09/2020 0356   CALCIUM 9.0 05/09/2020 0356   PROT 5.9 (L) 04/13/2020 0500   ALBUMIN 1.9 (L) 05/09/2020 0356   AST 25 04/13/2020 0500   ALT 25 04/13/2020 0500   ALKPHOS 316 (H) 04/13/2020 0500   BILITOT 0.8 04/13/2020 0500   GFRNONAA 15 (L) 05/09/2020 0356   GFRAA 33 (L) 03/05/2020 1703   Lipase  No results found for: LIPASE     Studies/Results: No results found.  Anti-infectives: Anti-infectives (From admission, onward)   Start     Dose/Rate Route Frequency Ordered Stop   05/03/20 2000  ampicillin (OMNIPEN) 2 g in sodium chloride 0.9 % 100 mL IVPB  Status:  Discontinued        2 g 300 mL/hr over 20 Minutes  Intravenous Every 24 hours 05/03/20 1610 05/03/20 1629   05/03/20 1800  meropenem (MERREM) 500 mg in sodium chloride 0.9 % 100 mL IVPB        500 mg 200 mL/hr over 30 Minutes Intravenous Every 24 hours 05/03/20 1610 05/13/20 1759   05/03/20 1800  ampicillin (OMNIPEN) 2 g in sodium chloride 0.9 % 100 mL IVPB  Status:  Discontinued        2 g 300 mL/hr over 20 Minutes Intravenous Every 24 hours 05/03/20 1629 05/04/20 0831   04/12/20 1800  meropenem (MERREM) 500 mg in sodium chloride 0.9 % 100 mL IVPB  Status:  Discontinued        500 mg 200 mL/hr over 30 Minutes Intravenous Every 24 hours 04/12/20 1746 04/14/20 1426   04/06/20 1200  vancomycin (VANCOCIN) IVPB 1000 mg/200 mL premix        1,000 mg 200 mL/hr over 60 Minutes Intravenous Every Fri (Hemodialysis) 04/05/20 1529 04/06/20 1300   04/04/20 1200  vancomycin (VANCOCIN) IVPB 1000 mg/200 mL premix        1,000 mg 200 mL/hr over 60 Minutes Intravenous Every M-W-F (Hemodialysis) 04/04/20 1014 04/04/20 1423   04/03/20 0000  piperacillin-tazobactam (ZOSYN) IVPB 2.25 g  Status:  Discontinued        2.25 g 100 mL/hr over 30 Minutes Intravenous Every 8 hours 04/02/20 2253 04/09/20 1559   04/02/20 2345  vancomycin (VANCOCIN) IVPB 1000 mg/200 mL premix        1,000 mg 200 mL/hr over 60 Minutes Intravenous  Once 04/02/20 2251 04/03/20 0236   04/02/20 2251  vancomycin variable dose per unstable renal function (pharmacist dosing)  Status:  Discontinued         Does not apply See admin instructions 04/02/20 2251 04/07/20 1037   04/02/20 1200  ceFAZolin (ANCEF) IVPB 1 g/50 mL premix  Status:  Discontinued        1 g 100 mL/hr over 30 Minutes Intravenous Every 24 hours 04/02/20 1030 04/02/20 2251   04/01/20 1300  vancomycin (VANCOCIN) 2,000 mg in sodium chloride 0.9 % 500 mL IVPB  Status:  Discontinued        2,000 mg 250 mL/hr over 120 Minutes Intravenous  Once 04/01/20 1203 04/01/20 1203   04/01/20 1300  vancomycin (VANCOREADY) IVPB 2000 mg/400 mL         2,000 mg 200 mL/hr over 120 Minutes Intravenous Every 12 hours 04/01/20 1205 04/01/20 1715   04/01/20 1300  vancomycin (VANCOCIN) 2,000 mg in sodium chloride 0.9 % 500 mL IVPB  Status:  Discontinued        2,000 mg 250 mL/hr over 120 Minutes Intravenous  Once 04/01/20 1207 04/01/20 1208   04/01/20 1245  vancomycin (VANCOCIN) 2,000 mg in sodium chloride 0.9 % 500 mL IVPB  Status:  Discontinued        2,000 mg 250 mL/hr over 120 Minutes Intravenous  Once 04/01/20 1154 04/01/20 1206   04/01/20 1154  vancomycin variable dose per unstable renal function (pharmacist dosing)  Status:  Discontinued         Does not apply See admin instructions 04/01/20 1154 04/02/20 1030   03/11/20 1200  cefTRIAXone (ROCEPHIN) 2 g in sodium chloride 0.9 % 100 mL IVPB        2 g 200 mL/hr over 30 Minutes Intravenous Every 24 hours 03/11/20 1107 03/17/20 1307   03/10/20 1200  piperacillin-tazobactam (ZOSYN) IVPB 3.375 g  Status:  Discontinued        3.375 g 12.5 mL/hr over 240 Minutes Intravenous Every 12 hours 03/10/20 1117 03/10/20 1245   03/10/20 1130  vancomycin (VANCOCIN) IVPB 1000 mg/200 mL premix        1,000 mg 200 mL/hr over 60 Minutes Intravenous  Once 03/10/20 1117 03/10/20 1258   03/10/20 1000  vancomycin variable dose per unstable renal function (pharmacist dosing)  Status:  Discontinued         Does not apply See admin instructions 03/09/20 1412 03/14/20 0810   03/09/20 2300  piperacillin-tazobactam (ZOSYN) IVPB 2.25 g  Status:  Discontinued        2.25 g 100 mL/hr over 30 Minutes Intravenous Every 8 hours 03/09/20 1412 03/10/20 1117   03/09/20 1415  piperacillin-tazobactam (ZOSYN) IVPB 3.375 g        3.375 g 100 mL/hr over 30 Minutes Intravenous  Once 03/09/20 1412 03/09/20 1537   03/09/20 1415  vancomycin (VANCOCIN) 2,250 mg in sodium chloride 0.9 % 500 mL IVPB        2,250 mg 250 mL/hr over 120 Minutes Intravenous  Once 03/09/20 1412 03/09/20 1639   02/28/20 2200  ceFEPIme (MAXIPIME) 1 g  in sodium chloride 0.9 % 100 mL  IVPB        1 g 200 mL/hr over 30 Minutes Intravenous Every 12 hours 02/28/20 1305 02/29/20 2220   02/28/20 0907  ceFEPIme (MAXIPIME) 1 g in sodium chloride 0.9 % 100 mL IVPB  Status:  Discontinued        1 g 200 mL/hr over 30 Minutes Intravenous Every 24 hours 02/27/20 0939 02/28/20 1305   02/26/20 2200  ceFEPIme (MAXIPIME) 2 g in sodium chloride 0.9 % 100 mL IVPB  Status:  Discontinued        2 g 200 mL/hr over 30 Minutes Intravenous Every 12 hours 02/26/20 1519 02/27/20 0939   02/25/20 1200  ceFEPIme (MAXIPIME) 2 g in sodium chloride 0.9 % 100 mL IVPB  Status:  Discontinued        2 g 200 mL/hr over 30 Minutes Intravenous Every T-Th-Sa (Hemodialysis) 02/25/20 0955 02/26/20 1519   02/24/20 1904  azithromycin (ZITHROMAX) 500 mg in sodium chloride 0.9 % 250 mL IVPB  Status:  Discontinued        500 mg 250 mL/hr over 60 Minutes Intravenous Every 24 hours 02/24/20 1904 02/26/20 0815   02/24/20 1900  azithromycin (ZITHROMAX) 250 mg in dextrose 5 % 125 mL IVPB  Status:  Discontinued        250 mg 125 mL/hr over 60 Minutes Intravenous Every 24 hours 02/24/20 1845 02/24/20 1904   02/24/20 1800  ceFEPIme (MAXIPIME) 2 g in sodium chloride 0.9 % 100 mL IVPB  Status:  Discontinued        2 g 200 mL/hr over 30 Minutes Intravenous Every Fri (Hemodialysis) 02/24/20 1326 02/25/20 0942   02/22/20 1745  ceFAZolin (ANCEF) IVPB 2g/100 mL premix        2 g 200 mL/hr over 30 Minutes Intravenous  Once 02/22/20 1744 02/22/20 1931   02/22/20 0600  ceFAZolin (ANCEF) IVPB 2g/100 mL premix        2 g 200 mL/hr over 30 Minutes Intravenous To Radiology 02/21/20 1658 02/22/20 1900   02/22/20 0000  ceFAZolin (ANCEF) IVPB 1 g/50 mL premix  Status:  Discontinued        1 g 100 mL/hr over 30 Minutes Intravenous To Radiology 02/21/20 1645 02/21/20 1658   02/21/20 1800  azithromycin (ZITHROMAX) tablet 250 mg  Status:  Discontinued       "Followed by" Linked Group Details   250 mg Oral  Daily-1800 02/20/20 1658 02/24/20 1852   02/21/20 1800  ceFEPIme (MAXIPIME) 2 g in sodium chloride 0.9 % 100 mL IVPB  Status:  Discontinued        2 g 200 mL/hr over 30 Minutes Intravenous Every T-Th-Sa (1800) 02/20/20 1737 02/24/20 1326   02/20/20 1800  azithromycin (ZITHROMAX) tablet 500 mg       "Followed by" Linked Group Details   500 mg Oral Daily 02/20/20 1658 02/20/20 1731   02/20/20 1745  ceFEPIme (MAXIPIME) 1 g in sodium chloride 0.9 % 100 mL IVPB        1 g 200 mL/hr over 30 Minutes Intravenous STAT 02/20/20 1737 02/21/20 0747   02/19/20 1145  remdesivir 100 mg in sodium chloride 0.9 % 100 mL IVPB        100 mg 200 mL/hr over 30 Minutes Intravenous Daily 02/19/20 1131 02/19/20 1443   02/15/20 1000  remdesivir 100 mg in sodium chloride 0.9 % 100 mL IVPB  Status:  Discontinued       "Followed by" Linked Group Details   100 mg 200  mL/hr over 30 Minutes Intravenous Daily 02/14/20 0944 02/14/20 0951   02/15/20 1000  remdesivir 100 mg in sodium chloride 0.9 % 100 mL IVPB        100 mg 200 mL/hr over 30 Minutes Intravenous Daily 02/14/20 0952 02/17/20 1102   02/14/20 1030  remdesivir 100 mg in sodium chloride 0.9 % 100 mL IVPB        100 mg 200 mL/hr over 30 Minutes Intravenous Every 30 min 02/14/20 0952 02/14/20 1129   02/14/20 0945  remdesivir 200 mg in sodium chloride 0.9% 250 mL IVPB  Status:  Discontinued       "Followed by" Linked Group Details   200 mg 580 mL/hr over 30 Minutes Intravenous Once 02/14/20 0944 02/14/20 0951       Assessment/Plan Morbid obesityBMI 42.02 Hypothyroidism HTN GERD ESRDon HD DM Asthma Chronic respiratory failure 2/2 Covid-19 (diagnosed 02/09/20) S/p tracheostomy Code status DNR  Necrotizing soft tissue infection S/pincision and debridement of sacrum, left gluteus11/2 Dr. Bobbye Morton - afebrile, WBC WNL - VAC reapplied today by WOCN. Will recheck wound Friday during VAC change.    LOS: 85 days    Jillyn Ledger , [facility] 05/09/2020, 9:14 AM Please see Amion for pager number during day hours 7:00am-4:30pm

## 2020-05-09 NOTE — Progress Notes (Signed)
HD#85 Subjective:  Overnight Events: No events overnight  Patient resting comfortably in bed.  States that she is breathing better and has less mucous plugs.  She is tolerating all of her medications well and appears to have improved appetite.  Objective:  Vital signs in last 24 hours: Vitals:   05/09/20 0358 05/09/20 0413 05/09/20 0733 05/09/20 1152  BP: 108/67  119/72 (!) 159/120  Pulse: 100 100 100 (!) 103  Resp: 18 18 19 19   Temp: 98 F (36.7 C)  98.1 F (36.7 C) 98 F (36.7 C)  TempSrc: Oral  Oral Oral  SpO2: 98% 98% 97% 97%  Weight:      Height:       Supplemental O2: Trach collar SpO2: 97 % O2 Flow Rate (L/min): 5 L/min FiO2 (%): 28 %   Physical Exam:  Physical Exam Constitutional:      Appearance: Normal appearance.  HENT:     Head: Normocephalic and atraumatic.  Eyes:     Extraocular Movements: Extraocular movements intact.  Cardiovascular:     Rate and Rhythm: Normal rate.     Pulses: Normal pulses.     Heart sounds: Normal heart sounds.  Pulmonary:     Effort: Pulmonary effort is normal.     Breath sounds: Normal breath sounds.  Abdominal:     General: Bowel sounds are normal.     Palpations: Abdomen is soft.     Tenderness: There is no abdominal tenderness.  Musculoskeletal:        General: Normal range of motion.     Cervical back: Normal range of motion.     Right lower leg: No edema.     Left lower leg: No edema.  Skin:    General: Skin is warm and dry.  Neurological:     Mental Status: She is alert and oriented to person, place, and time. Mental status is at baseline.  Psychiatric:        Mood and Affect: Mood normal.     Filed Weights   04/26/20 0417 05/02/20 0459  Weight: 105 kg 104.9 kg     Intake/Output Summary (Last 24 hours) at 05/09/2020 1305 Last data filed at 05/09/2020 1000 Gross per 24 hour  Intake 240 ml  Output --  Net 240 ml   Net IO Since Admission: 22,561.35 mL [05/09/20 1305]  Pertinent Labs: CBC Latest  Ref Rng & Units 05/08/2020 05/07/2020 05/05/2020  WBC 4.0 - 10.5 K/uL 9.8 8.0 10.3  Hemoglobin 12.0 - 15.0 g/dL 8.1(L) 7.4(L) 7.8(L)  Hematocrit 36 - 46 % 25.2(L) 24.3(L) 23.1(L)  Platelets 150 - 400 K/uL 577(H) 533(H) 487(H)    CMP Latest Ref Rng & Units 05/09/2020 05/08/2020 05/07/2020  Glucose 70 - 99 mg/dL 114(H) 97 87  BUN 6 - 20 mg/dL 34(H) 69(H) 46(H)  Creatinine 0.44 - 1.00 mg/dL 3.48(H) 6.74(H) 5.17(H)  Sodium 135 - 145 mmol/L 135 136 136  Potassium 3.5 - 5.1 mmol/L 4.6 5.8(H) 4.8  Chloride 98 - 111 mmol/L 97(L) 97(L) 98  CO2 22 - 32 mmol/L 26 24 27   Calcium 8.9 - 10.3 mg/dL 9.0 10.2 10.0  Total Protein 6.5 - 8.1 g/dL - - -  Total Bilirubin 0.3 - 1.2 mg/dL - - -  Alkaline Phos 38 - 126 U/L - - -  AST 15 - 41 U/L - - -  ALT 0 - 44 U/L - - -    Imaging: No results found.  Assessment/Plan:   Principal Problem:  COVID-19 Active Problems:   End stage renal disease on dialysis ([place])   Diabetes mellitus type 2 in obese (HCC)   OSA (obstructive sleep apnea)   HCAP (healthcare-associated pneumonia)   Acute respiratory failure with hypoxemia (HCC)   Encephalopathy acute   ARDS (adult respiratory distress syndrome) (HCC)   Pressure injury of skin   Aspiration into airway   Status post tracheostomy ([place])   Fever   Septic shock (HCC)   Cellulitis of buttock   Palliative care encounter   Sacral wound   Patient Summary: Wanda Gallop Alstonis a 49 y.o.with a pertinent PMH of OSA, morbid obesity, hypothyroidism, hypertension, GERD, ESRD, diabetes, asthma, who presented withshortness of breathand admitted for Covid pneumoniarequiring tracheostomy andcomplicated by prolonged hospital stay including subsequent MSSApneumoniaand necrotizing soft tissue infectionof her sacrum.  Chronic hypoxic respiratory failure s/p tracheostomy secondary to IBBCW-88 pneumonia complicated by MSSA pneumonia: -Continue 5 L trach collar as needed -Periodic suctioning for mucous  plugs -Appreciate pulmonary managing patient's tracheostomy.  Will defer to pulmonary for decannulation in the future..    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 -Continue wound VAC managed per surgery and wound care -Continue pain management with oxycodone 10 mg every 6 hours and fentanyl every 2 hours as needed for severe pain/breakthrough/dressing changes  ESRD Patient has tunneled cath in place for HD -Continue HD per nephrology -Continue midodrine 10 mg 3 times daily with HD -Continue Aranespper nephrology   Depression/Anxiety: -Continue Seroquel nightly and Klonopin daily  Type 2 diabetes: Decrease patient's insulin as she has had hypoglycemia. -3 units Lantus with sensitive sliding scale  High risk for malnutrition: Deconditioning  -Continue p.o. intake as she appears to be getting enough calories. -CR consulted for inpatient rehab. -All medications switch back to oral route at this time.  Dispo: Anticipated discharge toCIRpending provement.Diet:Normal QBV:QXIH,WTUU EKC:MKLKJZP Code:DNR PT/OT recs:LTAC,none. TOC recs:pending  Dispo: Anticipated discharge to [facility] pending improvement   Lawerance Cruel, D.O.  Internal Medicine Resident, PGY-2 [facility]  Pager: [phone] 1:05 PM, 05/09/2020   Please contact the on call pager after 5 pm and on weekends at [phone].

## 2020-05-09 NOTE — Progress Notes (Signed)
Occupational Therapy Treatment Patient Details Name: Wanda Ramirez MRN: [id number] DOB: 1970-06-14 Today's Date: 05/09/2020    History of present illness 49 yo admitted 9/13 with Covid PNA, intubated 9/24-10/6, reintubated 10/8, CRRT 9/26-10/7, trach and bronch 10/18. Pt returned to vent 10/30-11/7. Pt with significant sacral wound to bone s/p I&D x 2. Pt with hemorrhage from wound 11/14, hemorrhagic shock 11/15. PMhx: ESRD TTS, obesity, HTN, GERD, hypothyroidism, DM, asthma   OT comments  Pt seen together with PT and had a great session today. Pt completed bed mobility at min guard level to sit EOB. Utilized sara stedy to transfer OOB and up to recliner for the first time in weeks. Pt completed 2x sit<>stands with mod A +2 utilizing stedy bar. Pt fatigued at end of session but happy to be up in recliner and eager to continue working with therapies. Updated d/c recommendation to CIR.    Follow Up Recommendations  CIR    Equipment Recommendations  Other (comment) (defer to next venue)    Recommendations for Other Services      Precautions / Restrictions Precautions Precautions: Fall Precaution Comments: trach, sacral wound Required Braces or Orthoses: Other Brace Other Brace: bil prevalons Restrictions Weight Bearing Restrictions: No       Mobility Bed Mobility Overal bed mobility: Needs Assistance Bed Mobility: Supine to Sit           General bed mobility comments: min guard to come to EOB.  Transfers Overall transfer level: Needs assistance Equipment used: Ambulation equipment used (sara stedy) Transfers: Sit to/from Stand Sit to Stand: Mod assist;+2 physical assistance;+2 safety/equipment         General transfer comment: 2x sit<>stand utilizing sara stedy. Cues for technique. assist to stedy. Fatigued at end of session but happy to be in recliner.     Balance Overall balance assessment: Needs assistance Sitting-balance support: Feet supported Sitting  balance-Leahy Scale: Fair Sitting balance - Comments: pt able to maintain EOB sitting without bilateral UE support and was min guard   Standing balance support: Bilateral upper extremity supported Standing balance-Leahy Scale: Poor Standing balance comment: stood utilizing sara stedy bar.                           ADL either performed or assessed with clinical judgement   ADL Overall ADL's : Needs assistance/impaired Eating/Feeding: Set up;Sitting Eating/Feeding Details (indicate cue type and reason): likely would need supported seating for extended eating/drinking session Grooming: Set up;Minimal assistance;Sitting Grooming Details (indicate cue type and reason): setup in supported seating, likely min A for full grooming session in unsupported sitting                 Toilet Transfer: Moderate assistance;+2 for physical assistance;+2 for safety/equipment (sara stedy) Toilet Transfer Details (indicate cue type and reason): simulated with EOB to recliner. Utilized sara stedy with pt able to come to full standing position 2x with mod A +2  Fatigued afterwards but in good spirits sitting up in recliner for the first time in several weeks!!           General ADL Comments: bed mobility completed then EOB to recliner utilizing      Vision       Perception     Praxis      Cognition Arousal/Alertness: Awake/alert Behavior During Therapy: WFL for tasks assessed/performed Overall Cognitive Status: Within Functional Limits for tasks assessed  General Comments: WFL for tasked assessed. Pleasant, following commands and conversationally appropriate. Eager to work with therapies.        Exercises     Shoulder Instructions       General Comments      Pertinent Vitals/ Pain       Pain Assessment: Faces Faces Pain Scale: Hurts even more Pain Location: L buttock Pain Descriptors / Indicators: Grimacing;Discomfort Pain  Intervention(s): Monitored during session;Repositioned;Other (comment) (ordered geomat for recliner, pillows in seat until received)  Home Living                                          Prior Functioning/Environment              Frequency  Min 2X/week        Progress Toward Goals  OT Goals(current goals can now be found in the care plan section)  Progress towards OT goals: Progressing toward goals  Acute Rehab OT Goals Patient Stated Goal: stand and go to rehab OT Goal Formulation: With patient Time For Goal Achievement: 05/23/20 Potential to Achieve Goals: Good ADL Goals Pt Will Perform Grooming: with set-up;sitting (unsuppported sitting) Pt Will Perform Upper Body Bathing: sitting;with min guard assist Pt Will Perform Lower Body Bathing: sit to/from stand;with mod assist Pt Will Perform Upper Body Dressing: with min guard assist;sitting Pt Will Transfer to Toilet: with mod assist;with +2 assist;stand pivot transfer;bedside commode Pt Will Perform Toileting - Clothing Manipulation and hygiene: with mod assist;sit to/from stand Pt/caregiver will Perform Home Exercise Program: Increased strength;Increased ROM;Both right and left upper extremity;With minimal assist;With written HEP provided Additional ADL Goal #1: goal met Additional ADL Goal #2: goal met  Plan Discharge plan needs to be updated    Co-evaluation    PT/OT/SLP Co-Evaluation/Treatment: Yes Reason for Co-Treatment: Complexity of the patient's impairments (multi-system involvement);For patient/therapist safety;To address functional/ADL transfers   OT goals addressed during session: ADL's and self-care      AM-PAC OT "6 Clicks" Daily Activity     Outcome Measure   Help from another person eating meals?: A Little Help from another person taking care of personal grooming?: A Little Help from another person toileting, which includes using toliet, bedpan, or urinal?: A Lot Help from  another person bathing (including washing, rinsing, drying)?: A Lot Help from another person to put on and taking off regular upper body clothing?: A Lot Help from another person to put on and taking off regular lower body clothing?: A Lot 6 Click Score: 14    End of Session Equipment Utilized During Treatment: Gait belt;Oxygen;Other (comment) (sara stedy)  OT Visit Diagnosis: Other abnormalities of gait and mobility (R26.89);Muscle weakness (generalized) (M62.81);Other symptoms and signs involving cognitive function   Activity Tolerance Patient tolerated treatment well;Patient limited by fatigue   Patient Left in chair;with call bell/phone within reach;with chair alarm set   Nurse Communication Mobility status;Need for lift equipment;Precautions;Other (comment) (geomat ordered by Retail banker)        Time: 3244-0102 OT Time Calculation (min): 29 min  Charges: OT General Charges $OT Visit: 1 Visit OT Treatments $Self Care/Home Management : 8-22 mins  Tyrone Schimke, OT Acute Rehabilitation Services Pager: [phone] Office: [phone]    Hortencia Pilar 05/09/2020, 12:30 PM

## 2020-05-09 NOTE — Progress Notes (Signed)
[facility] NEPHROLOGY PROGRESS NOTE  Assessment/ Plan: Pt is a 49 y.o. yo female  w/ ESRD on TTS HD admitted for COVID PNA on 02/13/20. She was intubated. SP CRRT around 9/26- 10/7. Now is on intermittent HD MWF here. She is sp trach. She had MSSA pna. DM2.Then she had large sacral decub requiring I&D.   OP HD:TTS  4h 84mn 450/800 2/2.25 bath 111.5kg Hep 2000 L AVF - darbe 50 ug q week, last 9/7 - calc 1.0 tiw - 9/9 Hb 10.0, tsat 22%  # COVID pna sp trach complicated by MSSA PNA - off vent on trach collar.  #Hypotension - BP's stable on midodrine 10 tid. Monitor BP.  #Stage IV sacral decub:- sp I&D x 2.  On meropenem. wound care, general surgery is following.  #ESRD - S/P CRRT, now HD TTS.   HD yesterday, tolerated well.  Plan for next hemodialysis tomorrow.  #Anemia ckd/ critical illness - on darbe 150 weekly, 11/12 Ferritin >4000, iron sat 11. With active infection and ^^ ferritin no IV iron. tx prbc's PRN. Rechecked iron panel-- Ferritin 2777 and %sat 18, hold off on IV iron for now  #MBD ckd -monitor Ca and phos level, off binders. Monitor.   # HD access: LUA AVF clotted here during acute covid. TDC x2 per IR  #Dispo: Considering inpatient rehab.  Otherwise she will need LTAC because of trach and dialysis need.  Subjective: Seen and examined.  Denies nausea vomiting, chest pain or shortness of breath.  No new event. Objective Vital signs in last 24 hours: Vitals:   05/09/20 0150 05/09/20 0358 05/09/20 0413 05/09/20 0733  BP:  108/67  119/72  Pulse: 98 100 100 100  Resp: (!) _0 Temp:  98 F (36.7 C)  98.1 F (36.7 C)  TempSrc:  Oral  Oral  SpO2: 99% 98% 98% 97%  Weight:      Height:       Weight change:  No intake or output data in the 24 hours ending 05/09/20 0921     Labs: Basic Metabolic Panel: Recent Labs  Lab 05/07/20 0310 05/08/20 0200 05/09/20 0356  NA 136 136 135  K 4.8 5.8* 4.6  CL 98 97* 97*  CO2 _1 GLUCOSE 87 97 114*  BUN 46* 69* 34*  CREATININE 5.17* 6.74* 3.48*  CALCIUM 10.0 10.2 9.0  PHOS 5.1* 5.8* 4.3   Liver Function Tests: Recent Labs  Lab 05/07/20 0310 05/08/20 0200 05/09/20 0356  ALBUMIN 1.8* 1.9* 1.9*   No results for input(s): LIPASE, AMYLASE in the last 168 hours. No results for input(s): AMMONIA in the last 168 hours. CBC: Recent Labs  Lab 05/03/20 0339 05/03/20 0339 05/04/20 0524 05/04/20 0524 05/05/20 0516 05/07/20 0310 05/08/20 0718  WBC 9.3   < > 8.0   < > 10.3 8.0 9.8  HGB 7.7*   < > 7.2*   < > 7.8* 7.4* 8.1*  HCT 23.9*   < > 22.2*   < > 23.1* 24.3* 25.2*  MCV 87.9  --  86.7  --  86.5 89.7 88.7  PLT 433*   < > 439*   < > 487* 533* 577*   < > = values in this interval not displayed.   Cardiac Enzymes: No results for input(s): CKTOTAL, CKMB, CKMBINDEX, TROPONINI in the last 168 hours. CBG: Recent Labs  Lab 05/08/20 2222 05/09/20 0022 05/09/20 0101 05/09/20 0401 05/09/20 0733  GLUCAP 180* 64* 99  103* 106*    Iron Studies: No results for input(s): IRON, TIBC, TRANSFERRIN, FERRITIN in the last 72 hours. Studies/Results: No results found.  Medications: Infusions: . sodium chloride 250 mL (04/08/20 2040)  . meropenem (MERREM) IV 500 mg (05/08/20 1813)    Scheduled Medications: . B-complex with vitamin C  1 tablet Per Tube Daily  . chlorhexidine gluconate (MEDLINE KIT)  15 mL Mouth Rinse BID  . Chlorhexidine Gluconate Cloth  6 each Topical Daily  . clonazepam  0.25 mg Per Tube Daily  . collagenase   Topical Daily  . darbepoetin (ARANESP) injection - DIALYSIS  150 mcg Intravenous Q Thu-HD  . feeding supplement (GLUCERNA SHAKE)  237 mL Oral TID WC  . heparin injection (subcutaneous)  5,000 Units Subcutaneous Q8H  . hydrocerin   Topical Daily  . insulin aspart  0-6 Units Subcutaneous Q4H  . insulin glargine  3 Units Subcutaneous Daily  . levothyroxine  125 mcg Per Tube Q0600  . midodrine  10 mg Per Tube TID WC  . multivitamin  1  tablet Per Tube QHS  . nutrition supplement (JUVEN)  1 packet Oral BID BM  . nystatin  5 mL Mouth/Throat QID  . oxyCODONE  10 mg Per Tube Q6H  . pantoprazole sodium  40 mg Per Tube Daily  . QUEtiapine  50 mg Per Tube QHS  . sodium chloride flush  10-40 mL Intracatheter Q12H    have reviewed scheduled and prn medications.  Physical Exam: General: Comfortable, has tracheostomy Heart:RRR, s1s2 nl Lungs: Bibasal rhonchi and some coarse breath sound Abdomen:soft, Non-tender, non-distended Extremities: Trace edema Dialysis Access: Left IJ tunnel HD catheter  Wanda Ramirez Tanna Furry 05/09/2020,9:21 AM  LOS: 53 days  Pager: [phone]

## 2020-05-09 NOTE — Progress Notes (Signed)
Nutrition Follow-up  DOCUMENTATION CODES:   Morbid obesity  INTERVENTION:   Continue Glucerna Shake po TID, each supplement provides 220 kcal and 10 grams of protein  Continue Magic cup TID with meals, each supplement provides 290 kcal and 9 grams of protein  Continue Juven po BID for wound healing   NUTRITION DIAGNOSIS:   Inadequate oral intake related to inability to eat as evidenced by NPO status. -- progressing, pt on carb modified diet  GOAL:   Patient will meet greater than or equal to 90% of their needs -- progressing  MONITOR:   PO intake, Diet advancement, Weight trends, Labs, I & O's, Skin, Supplement acceptance  REASON FOR ASSESSMENT:   Consult Calorie Count  ASSESSMENT:   Pt with PMH significant for ESRD on HD, chronic hypoxic respiratory failure on 3L [place] at baseline, sleep apnea, DM, HTN, HLD, hypothyroidism, and asthma. Pt admitted 02/17/20 for COVID-19 pneumonia who developed chronic respiratory failure requiring a tracheostomy after an extended stay in ICU. Pt's hospitalization has been complicated by MSSA pneumonia as well as the development of a necrotizing soft tissue infection involving her gluteus secondary to MDR organisms. She was transferred to the IMTS 11/10.  11/23 - FEES, diet advanced to dysphagia 2 with thin liquids 11/24 - wound VAC placed 12/1- wound VAC removed; diet advanced to carb modified  Pt being assessed for CIR admission per MD. Otherwise, plan is for pt to go to LTAC due to trach and dialysis needs.    Pt unavailable at time of RD visit. Discussed pt with RN. Pt had wound VAC applied by WOCN this morning.   Pt's appetite/po intake are improving. Meal completions charted as 15-100% x last 8 recorded meals (61.25% average meal intake). Per RN, pt has continued to do well with supplement consumption. Pt has been receiving Glucerna po TID, Juven po BID, and Magic Cup po TID. Will continue with current nutrition plan of care.    No BM  documented since 11/30. RN states that pt reports LBM was yesterday, 12/7.  No updated weight since last RD assessment.   Pt under EDW, last wt 104.9 kg EDW 111.5 kg  Last HD 12/4, net UF 2.5L  Labs: CBGs 99-103-106 Medications: b-complex with vitamin c, aranesp, ss novolog Q4H, 3 units lantus daily, rena-vit  Diet Order:   Diet Order            Diet Carb Modified Fluid consistency: Thin; Room service appropriate? Yes with Assist  Diet effective now                 EDUCATION NEEDS:   Not appropriate for education at this time  Skin:  Skin Assessment: Skin Integrity Issues: Skin Integrity Issues:: Unstageable, Incisions Stage II: right neck Unstageable: mid sacrum Incisions: buttocks  Last BM:  11/30 type 6 via rectal tube  Height:   Ht Readings from Last 1 Encounters:  04/05/20 5\' 2"  (1.575 m)    Weight:   Wt Readings from Last 1 Encounters:  06/25/18 124.7 kg    Ideal Body Weight:  50 kg  BMI:  Body mass index is 42.3 kg/m.  Estimated Nutritional Needs:   Kcal:  2100-2300  Protein:  120-140 grams  Fluid:  1 L + UOP    Larkin Ina, MS, RD, LDN RD pager number and weekend/on-call pager number located in [place].

## 2020-05-09 NOTE — Consult Note (Signed)
[place] Nurse wound follow up Patient receiving care in [facility] 2/w35.  Assisted with positioning by NT for VAC restart to buttocks wound. Wound type: surgical; former NSTI site Measurement: 14 cm x 14 cm x 4.2 cm. Tunnel extending down left thigh measures 11.2 cm in length. Pocket on right buttock measures 5.8 cm deep. Wound bed: 99% clean, pink, no odor. Drainage (amount, consistency, odor) to be determined. New VAC cannister installed on device. Periwound: intact. Barrier ring placed between lower edge of wound and anus to facilitate seal. Dressing procedure/placement/frequency: one white foam cut in half length wise. One segment tucked into left thigh tunnel, the other into right buttock pocket. Black foam placed over these and into remaining wound bed.  Drape applied. Bridged to left hip. Immediate seal obtained. Patient tolerated with minimal discomfort. Supplies in room for next several changes. [name], RN, MSN, CWOCN, CNS-BC, pager [phone]

## 2020-05-09 NOTE — Progress Notes (Signed)
Physical Therapy Treatment Patient Details Name: Wanda Ramirez MRN: [id number] DOB: 24-Jun-1970 Today's Date: 05/09/2020    History of Present Illness 49 yo admitted 9/13 with Covid PNA, intubated 9/24-10/6, reintubated 10/8, CRRT 9/26-10/7, trach and bronch 10/18. Pt returned to vent 10/30-11/7. Pt with significant sacral wound to bone s/p I&D x 2. Pt with hemorrhage from wound 11/14, hemorrhagic shock 11/15. PMhx: ESRD TTS, obesity, HTN, GERD, hypothyroidism, DM, asthma    PT Comments    Pt making excellent progress. Was able to get to the recliner using Denna Haggard. No longer needs tilt bed since she is standing and getting OOB. Continue to feel she will be excellent CIR candidate.    Follow Up Recommendations  CIR;Supervision/Assistance - 24 hour     Equipment Recommendations  Wheelchair (measurements PT);Wheelchair cushion (measurements PT);Rolling walker with 5" wheels;Hospital bed    Recommendations for Other Services       Precautions / Restrictions Precautions Precautions: Fall Precaution Comments: trach, sacral wound Required Braces or Orthoses: Other Brace Other Brace: bil prevalons Restrictions Weight Bearing Restrictions: No    Mobility  Bed Mobility Overal bed mobility: Needs Assistance Bed Mobility: Supine to Sit     Supine to sit: Min guard;HOB elevated     General bed mobility comments: Incr time and use of bed rail. Assist for lines/safety  Transfers Overall transfer level: Needs assistance Equipment used: Ambulation equipment used (sara stedy) Transfers: Sit to/from Stand Sit to Stand: Mod assist;+2 physical assistance         General transfer comment: Assist to bring hips up and for balance. Tactile cues to extend hips and trunk. Performed x 2. Used Stedy to go bed to chair.  Ambulation/Gait                 Stairs             Wheelchair Mobility    Modified Rankin (Stroke Patients Only)       Balance Overall balance  assessment: Needs assistance Sitting-balance support: Feet supported Sitting balance-Leahy Scale: Fair Sitting balance - Comments: pt able to maintain EOB sitting without bilateral UE support and was min guard   Standing balance support: Bilateral upper extremity supported Standing balance-Leahy Scale: Poor Standing balance comment: +2 min/mod for static standing with Stedy x 2 for 15-20 seconds                            Cognition Arousal/Alertness: Awake/alert Behavior During Therapy: WFL for tasks assessed/performed Overall Cognitive Status: Within Functional Limits for tasks assessed                                 General Comments: WFL for tasked assessed. Pleasant, following commands and conversationally appropriate. Eager to work with therapies.      Exercises      General Comments General comments (skin integrity, edema, etc.): VSS      Pertinent Vitals/Pain Pain Assessment: Faces Faces Pain Scale: Hurts even more Pain Location: L buttock Pain Descriptors / Indicators: Grimacing;Discomfort Pain Intervention(s): Monitored during session;Repositioned (ordered geomat for recliner, pillows in seat until received)    Home Living                      Prior Function            PT Goals (current goals can now be found  in the care plan section) Acute Rehab PT Goals Patient Stated Goal: stand and go to rehab PT Goal Formulation: With patient Time For Goal Achievement: 05/23/20 Potential to Achieve Goals: Good Additional Goals Additional Goal #1:  (pt progressed away from tilt bed) Progress towards PT goals: Progressing toward goals;Goals met and updated - see care plan    Frequency    Min 3X/week      PT Plan Current plan remains appropriate    Co-evaluation PT/OT/SLP Co-Evaluation/Treatment: Yes Reason for Co-Treatment: Complexity of the patient's impairments (multi-system involvement);For patient/therapist safety   OT  goals addressed during session: ADL's and self-care      AM-PAC PT "6 Clicks" Mobility   Outcome Measure  Help needed turning from your back to your side while in a flat bed without using bedrails?: A Little Help needed moving from lying on your back to sitting on the side of a flat bed without using bedrails?: A Little Help needed moving to and from a bed to a chair (including a wheelchair)?: Total Help needed standing up from a chair using your arms (e.g., wheelchair or bedside chair)?: A Lot Help needed to walk in hospital room?: Total Help needed climbing 3-5 steps with a railing? : Total 6 Click Score: 11    End of Session Equipment Utilized During Treatment: Gait belt;Oxygen Activity Tolerance: Patient tolerated treatment well Patient left: in chair;with call bell/phone within reach;with chair alarm set Nurse Communication: Mobility status;Need for lift equipment PT Visit Diagnosis: Muscle weakness (generalized) (M62.81);Difficulty in walking, not elsewhere classified (R26.2);Pain Pain - Right/Left: Left Pain - part of body: Hip     Time: 1132-1203 PT Time Calculation (min) (ACUTE ONLY): 31 min  Charges:  $Therapeutic Activity: 8-22 mins                     [place] Pager [phone] Office [place] 05/09/2020, 1:03 PM

## 2020-05-09 NOTE — Progress Notes (Signed)
  Speech Language Pathology Treatment: Wanda Ramirez Speaking valve  Patient Details Name: Wanda Ramirez MRN: [id number] DOB: 11/06/1970 Today's Date: 05/09/2020 Time: 6734-1937 SLP Time Calculation (min) (ACUTE ONLY): 24 min  Assessment / Plan / Recommendation Clinical Impression  Pt was seen for treatment and was cooperative during the session. PMSV was donned upon SLP's entry and she reported that she has been tolerating it without difficulty. Vocal quality was mildly hoarse but pt stated that this is her baseline. She tolerated PMSV for the duration of session with stable vitals and no respiratory distress. Pt was educated on basic PMSV care as well as donning and doffing. Pt was able to donn and doff valve four times with cueing for hand placement. Pt exhibited some difficulty with this task due to neuropathy; visual input was provided, but pt stated that this did not help improve her accuracy. Pt's efficiency improved as the session progressed and she was delighted by this. She verbalized agreement with continuing to practice until she is seen again. SLP will continue to follow pt. However, considering her progress during this session, it is anticipated that her discharge from SLP services is imminent.    HPI HPI: 49 year old female with PMH of with end-stage renal disease on hemodialysis and chronic lung disease on chronic supplemental oxygen at 3 L/min. Admitted 9/13 with acute on chronic hypoxic respiratory failure due to COVID-19 pneumonia. Intubated 9/24-10/6, reintubated 10/8, CRRT 9/26-10/7, trach and bronch 10/18, trach collar 11/8. PMV trials began 10/27 with minimal toleration, SLP discharged 11/3 due to medical decline. Reconsulted 11/10. Pt currently has uncuffed Shiley 6 XLT      SLP Plan  Continue with current plan of care       Recommendations  Diet recommendations: Regular;Thin liquid Liquids provided via: Straw;Cup Medication Administration: Whole meds with  liquid Supervision: Patient able to self feed;Intermittent supervision to cue for compensatory strategies Compensations: Slow rate;Small sips/bites Postural Changes and/or Swallow Maneuvers: Seated upright 90 degrees      Patient may use Passy-Muir Speech Valve: During all waking hours (remove during sleep) PMSV Supervision: Intermittent MD: Please consider changing trach tube to : Smaller size         Oral Care Recommendations: Oral care BID Follow up Recommendations: Inpatient Rehab SLP Visit Diagnosis: Aphonia (R49.1) Plan: Continue with current plan of care       Wanda Ramirez I. Hardin Negus, [place], [place] Office number [phone] Pager [place] 05/09/2020, 3:23 PM

## 2020-05-09 NOTE — Plan of Care (Signed)
  Problem: Education: Goal: Knowledge of risk factors and measures for prevention of condition will improve Outcome: Progressing   

## 2020-05-09 NOTE — Progress Notes (Signed)
SLP Cancellation Note  Patient Details Name: Wanda Ramirez MRN: [id number] DOB: Feb 06, 1971   Cancelled treatment:       Reason Eval/Treat Not Completed: Patient at procedure or test/unavailable (Pt currently being cleaned by RN. SLP will f/u)  Tobie Poet I. Hardin Negus, [place], [place] Office number [phone] Pager [place] 05/09/2020, 2:35 PM

## 2020-05-09 NOTE — Progress Notes (Signed)
Inpatient Diabetes Program Recommendations  AACE/ADA: New Consensus Statement on Inpatient Glycemic Control   Target Ranges:  Prepandial:   less than 140 mg/dL      Peak postprandial:   less than 180 mg/dL (1-2 hours)      Critically ill patients:  140 - 180 mg/dL   Results for Wanda Ramirez, Wanda Ramirez (MRN [id number]) as of 05/09/2020 12:44  Ref. Range 05/09/2020 00:22 05/09/2020 01:01 05/09/2020 04:01 05/09/2020 07:33 05/09/2020 12:11  Glucose-Capillary Latest Ref Range: 70 - 99 mg/dL 64 (L) 99 103 (H) 106 (H) 176 (H)  Results for Wanda Ramirez, Wanda Ramirez (MRN [id number]) as of 05/09/2020 12:44  Ref. Range 05/08/2020 04:16 05/08/2020 13:46 05/08/2020 17:24 05/08/2020 21:13 05/08/2020 22:22  Glucose-Capillary Latest Ref Range: 70 - 99 mg/dL 93 87 107 (H) 226 (H) 180 (H)  Novolog 3 units@22 :47   Review of Glycemic Control  Current orders for Inpatient glycemic control: Lantus 3 units daily, Novolog 0-6 units Q4H  Inpatient Diabetes Program Recommendations:    Insulin: Please consider discontinuing Lantus and changing frequency of CBGs to AC&HS, Novolog 0-6 units TID with meals and Novolog 0-5 units QHS.  Thanks, Barnie Alderman, RN, MSN, CDE Diabetes Coordinator Inpatient Diabetes Program [phone] (Team Pager from 8am to 5pm)

## 2020-05-09 NOTE — Progress Notes (Addendum)
Inpatient Rehabilitation Admissions Coordinator  Inpatient rehab consult received. I met with patient at bedside with nursing for assessment. We discussed goals and expectations of a possible Cir admit. I contacted Terri Piedra with Nephrology concerning barriers to d/c to outpatient hemodialysis of trach with need for suctioning as well as need for patient to have the ability to sit for prolonged periods to attend an outpatient dialysis center with her current sacral wound. I contacted pt's daughter, Wanda Ramirez, per patient request to discuss rehab options. Wanda Ramirez will discuss with her siblings the need for projected 24/7 physical care after any short rehab stay and she will clarify if this will be available. CIR vs SNF determination pending.  Danne Baxter, RN, MSN Rehab Admissions Coordinator [phone] 05/09/2020 12:51 PM   I also spoke with daughter, Wanda Ramirez, by phone and she and Wanda Ramirez will discuss with family her options for rehab.  Danne Baxter, RN, MSN Rehab Admissions Coordinator [phone] 05/09/2020 1:42 PM

## 2020-05-10 LAB — CBC
HCT: 23.6 % — ABNORMAL LOW (ref 36.0–46.0)
Hemoglobin: 7.2 g/dL — ABNORMAL LOW (ref 12.0–15.0)
MCH: 27.2 pg (ref 26.0–34.0)
MCHC: 30.5 g/dL (ref 30.0–36.0)
MCV: 89.1 fL (ref 80.0–100.0)
Platelets: 489 10*3/uL — ABNORMAL HIGH (ref 150–400)
RBC: 2.65 MIL/uL — ABNORMAL LOW (ref 3.87–5.11)
RDW: 15.9 % — ABNORMAL HIGH (ref 11.5–15.5)
WBC: 9.5 10*3/uL (ref 4.0–10.5)
nRBC: 0 % (ref 0.0–0.2)

## 2020-05-10 LAB — RENAL FUNCTION PANEL
Albumin: 2 g/dL — ABNORMAL LOW (ref 3.5–5.0)
Anion gap: 14 (ref 5–15)
BUN: 64 mg/dL — ABNORMAL HIGH (ref 6–20)
CO2: 25 mmol/L (ref 22–32)
Calcium: 10.1 mg/dL (ref 8.9–10.3)
Chloride: 98 mmol/L (ref 98–111)
Creatinine, Ser: 5.27 mg/dL — ABNORMAL HIGH (ref 0.44–1.00)
GFR, Estimated: 9 mL/min — ABNORMAL LOW (ref >60)
Glucose, Bld: 130 mg/dL — ABNORMAL HIGH (ref 70–99)
Phosphorus: 6.5 mg/dL — ABNORMAL HIGH (ref 2.5–4.6)
Potassium: 4.9 mmol/L (ref 3.5–5.1)
Sodium: 137 mmol/L (ref 135–145)

## 2020-05-10 LAB — GLUCOSE, CAPILLARY
Glucose-Capillary: 94 mg/dL (ref 70–99)
Glucose-Capillary: 123 mg/dL — ABNORMAL HIGH (ref 70–99)
Glucose-Capillary: 194 mg/dL — ABNORMAL HIGH (ref 70–99)
Glucose-Capillary: 159 mg/dL — ABNORMAL HIGH (ref 70–99)
Glucose-Capillary: 148 mg/dL — ABNORMAL HIGH (ref 70–99)

## 2020-05-10 MED ORDER — MIDODRINE HCL 5 MG PO TABS
ORAL_TABLET | ORAL | Status: AC
  Filled 2020-05-10: qty 2

## 2020-05-10 MED ORDER — PENTAFLUOROPROP-TETRAFLUOROETH EX AERO: 1.0000 "application " | INHALATION_SPRAY | CUTANEOUS | Status: DC | PRN

## 2020-05-10 MED ORDER — SODIUM CHLORIDE 0.9 % IV SOLN: 100.0000 mL | INTRAVENOUS | Status: DC | PRN

## 2020-05-10 MED ORDER — HEPARIN SODIUM (PORCINE) 1000 UNIT/ML DIALYSIS: 20.0000 [IU]/kg | INTRAMUSCULAR | Status: DC | PRN

## 2020-05-10 MED ORDER — LIDOCAINE HCL (PF) 1 % IJ SOLN: 5.0000 mL | INTRAMUSCULAR | Status: DC | PRN

## 2020-05-10 MED ORDER — LIDOCAINE-PRILOCAINE 2.5-2.5 % EX CREA: 1.0000 "application " | TOPICAL_CREAM | CUTANEOUS | Status: DC | PRN

## 2020-05-10 MED ORDER — HEPARIN SODIUM (PORCINE) 1000 UNIT/ML DIALYSIS: 1000.0000 [IU] | INTRAMUSCULAR | Status: DC | PRN

## 2020-05-10 MED ORDER — SEVELAMER CARBONATE 0.8 G PO PACK
1.6000 g | PACK | Freq: Three times a day (TID) | ORAL | Status: DC
  Administered 2020-05-10 – 2020-05-15 (×13): 1.6 g via ORAL
  Filled 2020-05-10 (×17): qty 2

## 2020-05-10 MED ORDER — ALTEPLASE 2 MG IJ SOLR: 2.0000 mg | Freq: Once | INTRAMUSCULAR | Status: DC | PRN

## 2020-05-10 MED ORDER — HEPARIN SODIUM (PORCINE) 1000 UNIT/ML IJ SOLN
INTRAMUSCULAR | Status: AC
  Administered 2020-05-10: 2100 [IU] via INTRAVENOUS_CENTRAL
  Filled 2020-05-10: qty 3

## 2020-05-10 MED ORDER — DARBEPOETIN ALFA 150 MCG/0.3ML IJ SOSY
PREFILLED_SYRINGE | INTRAMUSCULAR | Status: AC
  Administered 2020-05-10: 150 ug via INTRAVENOUS
  Filled 2020-05-10: qty 0.3

## 2020-05-10 MED ORDER — HEPARIN SODIUM (PORCINE) 1000 UNIT/ML IJ SOLN
INTRAMUSCULAR | Status: AC
  Administered 2020-05-10: 4200 [IU] via INTRAVENOUS_CENTRAL
  Filled 2020-05-10: qty 4

## 2020-05-10 NOTE — Progress Notes (Signed)
[facility] NEPHROLOGY PROGRESS NOTE  Assessment/ Plan: Pt is a 49 y.o. yo female  w/ ESRD on TTS HD admitted for COVID PNA on 02/13/20. She was intubated. SP CRRT around 9/26- 10/7. Now is on intermittent HD MWF here. She is sp trach. She had MSSA pna. DM2.Then she had large sacral decub requiring I&D.   OP HD:TTS  4h 40mn 450/800 2/2.25 bath 111.5kg Hep 2000 L AVF - darbe 50 ug q week, last 9/7 - calc 1.0 tiw - 9/9 Hb 10.0, tsat 22%  # COVID pna sp trach complicated by MSSA PNA - off vent on trach collar.  #Hypotension - BP's stable on midodrine 10 tid. Monitor BP.  #Stage IV sacral decub:- sp I&D x 2.  On meropenem. wound care, general surgery is following.  #ESRD - S/P CRRT, now HD TTS.   HD today, tolerating well.   #Anemia ckd/ critical illness - on darbe 150 weekly, 11/12 Ferritin >4000, iron sat 11. With active infection and ^^ ferritin no IV iron. tx prbc's PRN. Rechecked iron panel-- Ferritin 2777 and %sat 18, hold off on IV iron for now. Transfuse prn.  #MBD ckd -monitor Ca and phos level, start sevelamer. Monitor.   # HD access: LUA AVF clotted here during acute covid. TDC x2 per IR  #Dispo: Considering inpatient rehab.  Otherwise she will need LTAC because of trach and dialysis need.  Subjective: Seen and examined.  No new event.  Tolerating dialysis well.  Denies nausea vomiting chest pain.  Some cough.  Objective Vital signs in last 24 hours: Vitals:   05/10/20 0715 05/10/20 0730 05/10/20 0800 05/10/20 0830  BP: 125/66 139/83 127/74 130/79  Pulse: (!) 106 (!) 108 (!) 101   Resp: _0 (!) 21  Temp:      TempSrc:      SpO2: 100% 100% 100%   Weight:      Height:       Weight change:   Intake/Output Summary (Last 24 hours) at 05/10/2020 0956 Last data filed at 05/10/2020 0551 Gross per 24 hour  Intake 242.27 ml  Output 200 ml  Net 42.27 ml       Labs: Basic Metabolic Panel: Recent Labs  Lab 05/08/20 0200  05/09/20 0356 05/10/20 0241  NA 136 135 137  K 5.8* 4.6 4.9  CL 97* 97* 98  CO2 _1 GLUCOSE 97 114* 130*  BUN 69* 34* 64*  CREATININE 6.74* 3.48* 5.27*  CALCIUM 10.2 9.0 10.1  PHOS 5.8* 4.3 6.5*   Liver Function Tests: Recent Labs  Lab 05/08/20 0200 05/09/20 0356 05/10/20 0241  ALBUMIN 1.9* 1.9* 2.0*   No results for input(s): LIPASE, AMYLASE in the last 168 hours. No results for input(s): AMMONIA in the last 168 hours. CBC: Recent Labs  Lab 05/04/20 0524 05/05/20 0516 05/07/20 0310 05/08/20 0718 05/10/20 0629  WBC 8.0 10.3 8.0 9.8 9.5  HGB 7.2* 7.8* 7.4* 8.1* 7.2*  HCT 22.2* 23.1* 24.3* 25.2* 23.6*  MCV 86.7 86.5 89.7 88.7 89.1  PLT 439* 487* 533* 577* 489*   Cardiac Enzymes: No results for input(s): CKTOTAL, CKMB, CKMBINDEX, TROPONINI in the last 168 hours. CBG: Recent Labs  Lab 05/09/20 1211 05/09/20 1640 05/09/20 1939 05/09/20 2342 05/10/20 0409  GLUCAP 176* 211* 129* 163* 94    Iron Studies: No results for input(s): IRON, TIBC, TRANSFERRIN, FERRITIN in the last 72 hours. Studies/Results: No results found.  Medications: Infusions: . sodium chloride 250 mL (04/08/20 2040)  .  sodium chloride    . sodium chloride    . meropenem (MERREM) IV 500 mg (05/09/20 1727)    Scheduled Medications: . midodrine      . B-complex with vitamin C  1 tablet Oral Daily  . chlorhexidine gluconate (MEDLINE KIT)  15 mL Mouth Rinse BID  . Chlorhexidine Gluconate Cloth  6 each Topical Daily  . Chlorhexidine Gluconate Cloth  6 each Topical Q0600  . clonazepam  0.25 mg Oral Daily  . collagenase   Topical Daily  . darbepoetin (ARANESP) injection - DIALYSIS  150 mcg Intravenous Q Thu-HD  . feeding supplement (GLUCERNA SHAKE)  237 mL Oral TID WC  . heparin injection (subcutaneous)  5,000 Units Subcutaneous Q8H  . hydrocerin   Topical Daily  . insulin aspart  0-6 Units Subcutaneous Q4H  . insulin glargine  3 Units Subcutaneous Daily  . levothyroxine  125 mcg Oral  Q0600  . midodrine  10 mg Oral TID WC  . multivitamin  1 tablet Oral QHS  . nutrition supplement (JUVEN)  1 packet Oral BID BM  . oxyCODONE  10 mg Oral Q6H  . pantoprazole  40 mg Oral Daily  . QUEtiapine  50 mg Oral QHS  . sodium chloride flush  10-40 mL Intracatheter Q12H    have reviewed scheduled and prn medications.  Physical Exam: General: Comfortable, has tracheostomy Heart:RRR, s1s2 nl Lungs: Bibasal rhonchi and some coarse breath sound Abdomen:soft, Non-tender, non-distended Extremities: Trace edema Dialysis Access: Left IJ tunnel HD catheter  Wanda Ramirez Wanda Ramirez Wanda Ramirez 05/10/2020,9:56 AM  LOS: 86 days  Pager: [phone]

## 2020-05-10 NOTE — Plan of Care (Signed)
  Problem: Education: Goal: Knowledge of risk factors and measures for prevention of condition will improve Outcome: Progressing   Problem: Coping: Goal: Psychosocial and spiritual needs will be supported Outcome: Progressing   Problem: Respiratory: Goal: Will maintain a patent airway Outcome: Progressing Goal: Complications related to the disease process, condition or treatment will be avoided or minimized Outcome: Progressing   Problem: Education: Goal: Knowledge of General Education information will improve Description: Including pain rating scale, medication(s)/side effects and non-pharmacologic comfort measures Outcome: Progressing   Problem: Health Behavior/Discharge Planning: Goal: Ability to manage health-related needs will improve Outcome: Progressing   Problem: Clinical Measurements: Goal: Ability to maintain clinical measurements within normal limits will improve Outcome: Progressing Goal: Will remain free from infection Outcome: Progressing Goal: Diagnostic test results will improve Outcome: Progressing Goal: Respiratory complications will improve Outcome: Progressing Goal: Cardiovascular complication will be avoided Outcome: Progressing   Problem: Activity: Goal: Risk for activity intolerance will decrease Outcome: Progressing   Problem: Nutrition: Goal: Adequate nutrition will be maintained Outcome: Progressing   Problem: Coping: Goal: Level of anxiety will decrease Outcome: Progressing   Problem: Elimination: Goal: Will not experience complications related to bowel motility Outcome: Progressing   Problem: Pain Managment: Goal: General experience of comfort will improve Outcome: Progressing   Problem: Safety: Goal: Ability to remain free from injury will improve Outcome: Progressing   Problem: Skin Integrity: Goal: Risk for impaired skin integrity will decrease Outcome: Progressing   Problem: Activity: Goal: Ability to tolerate increased  activity will improve Outcome: Progressing   Problem: Respiratory: Goal: Ability to maintain a clear airway and adequate ventilation will improve Outcome: Progressing   Problem: Role Relationship: Goal: Method of communication will improve Outcome: Progressing   Problem: Education: Goal: Knowledge of disease and its progression will improve Outcome: Progressing Goal: Individualized Educational Video(s) Outcome: Progressing   Problem: Fluid Volume: Goal: Compliance with measures to maintain balanced fluid volume will improve Outcome: Progressing   Problem: Health Behavior/Discharge Planning: Goal: Ability to manage health-related needs will improve Outcome: Progressing   Problem: Nutritional: Goal: Ability to make healthy dietary choices will improve Outcome: Progressing   Problem: Clinical Measurements: Goal: Complications related to the disease process, condition or treatment will be avoided or minimized Outcome: Progressing   Problem: Activity: Goal: Ability to tolerate increased activity will improve Outcome: Progressing   Problem: Respiratory: Goal: Ability to maintain a clear airway and adequate ventilation will improve Outcome: Progressing   Problem: Role Relationship: Goal: Method of communication will improve Outcome: Progressing

## 2020-05-10 NOTE — Progress Notes (Addendum)
HD#86 Subjective:  Overnight Events: No overnight events  Patient resting comfortably in bed.  She was seen during dialysis.  She denies any new complaints at this time.  Wound VAC shows good drainage of serosanguineous fluid.  Objective:  Vital signs in last 24 hours: Vitals:   05/10/20 1217 05/10/20 1220 05/10/20 1300 05/10/20 1351  BP: 129/71  105/89   Pulse: (!) 109 (!) 121 94 (!) 124  Resp: 17 (!) 24    Temp: 98.5 F (36.9 C)  98.3 F (36.8 C)   TempSrc: Oral     SpO2: 98% 98%    Weight:      Height:       Supplemental O2:  Trach collar SpO2: 98 % O2 Flow Rate (L/min): 5 L/min FiO2 (%): 28 %   Physical Exam:  Physical Exam Constitutional:      Appearance: Normal appearance.  HENT:     Head: Normocephalic and atraumatic.  Eyes:     Extraocular Movements: Extraocular movements intact.  Cardiovascular:     Rate and Rhythm: Normal rate.     Pulses: Normal pulses.     Heart sounds: Normal heart sounds.  Pulmonary:     Effort: Pulmonary effort is normal.     Breath sounds: Normal breath sounds.  Abdominal:     General: Bowel sounds are normal.     Palpations: Abdomen is soft.     Tenderness: There is no abdominal tenderness.  Musculoskeletal:        General: Normal range of motion.     Cervical back: Normal range of motion.     Right lower leg: No edema.     Left lower leg: No edema.  Skin:    General: Skin is warm and dry.  Neurological:     Mental Status: She is alert and oriented to person, place, and time. Mental status is at baseline.  Psychiatric:        Mood and Affect: Mood normal.     Filed Weights   05/02/20 0459  Weight: 104.9 kg     Intake/Output Summary (Last 24 hours) at 05/10/2020 1405 Last data filed at 05/10/2020 1120 Gross per 24 hour  Intake 2.27 ml  Output 2700 ml  Net -2697.73 ml   Net IO Since Admission: 19,863.62 mL [05/10/20 1405]  Pertinent Labs: CBC Latest Ref Rng & Units 05/10/2020 05/08/2020 05/07/2020  WBC 4.0 -  10.5 K/uL 9.5 9.8 8.0  Hemoglobin 12.0 - 15.0 g/dL 7.2(L) 8.1(L) 7.4(L)  Hematocrit 36.0 - 46.0 % 23.6(L) 25.2(L) 24.3(L)  Platelets 150 - 400 K/uL 489(H) 577(H) 533(H)    CMP Latest Ref Rng & Units 05/10/2020 05/09/2020 05/08/2020  Glucose 70 - 99 mg/dL 130(H) 114(H) 97  BUN 6 - 20 mg/dL 64(H) 34(H) 69(H)  Creatinine 0.44 - 1.00 mg/dL 5.27(H) 3.48(H) 6.74(H)  Sodium 135 - 145 mmol/L 137 135 136  Potassium 3.5 - 5.1 mmol/L 4.9 4.6 5.8(H)  Chloride 98 - 111 mmol/L 98 97(L) 97(L)  CO2 22 - 32 mmol/L 25 26 24   Calcium 8.9 - 10.3 mg/dL 10.1 9.0 10.2  Total Protein 6.5 - 8.1 g/dL - - -  Total Bilirubin 0.3 - 1.2 mg/dL - - -  Alkaline Phos 38 - 126 U/L - - -  AST 15 - 41 U/L - - -  ALT 0 - 44 U/L - - -    Imaging: No results found.  Assessment/Plan:   Principal Problem:   COVID-19 Active Problems:  End stage renal disease on dialysis [facility])   Diabetes mellitus type 2 in obese (HCC)   OSA (obstructive sleep apnea)   HCAP (healthcare-associated pneumonia)   Acute respiratory failure with hypoxemia (HCC)   Encephalopathy acute   ARDS (adult respiratory distress syndrome) (HCC)   Pressure injury of skin   Aspiration into airway   Status post tracheostomy ([place])   Fever   Septic shock (HCC)   Cellulitis of buttock   Palliative care encounter   Sacral wound   Patient Summary: Wanda Ramirez is a 49 y.o. with a pertinent PMH of OSA, morbid obesity, hypothyroidism, hypertension, GERD, ESRD, diabetes, asthma, who presented with shortness of breath and admitted for Covid pneumonia requiring tracheostomy and complicated by prolonged hospital stay including subsequent MSSA pneumonia and necrotizing soft tissue infection of her sacrum.    Chronic hypoxic respiratory failure s/p tracheostomy secondary to [id number] pneumonia complicated by MSSA pneumonia: -Continue 5 L trach collar as needed -Periodic suctioning for mucous plugs -Appreciate pulmonary managing patient's tracheostomy.   Will defer to pulmonary for decannulation in the future..     Necrotizing soft tissue infection of sacral wound status post debridement (11/2 and 11/5) -On day 8 of meropenem for surgical wound infection, will finish 10-day course. -Good drainage of serosanguineous fluid from wound VAC.  Continue wound VAC at this time. -Appreciate surgery and wound care's assistance -Continue pain management with oxycodone 10 mg every 6 hours and fentanyl every 2 hours as needed for severe pain/breakthrough/dressing changes   ESRD Patient has tunneled cath in place for HD -Continue HD per nephrology -Continue midodrine 10 mg 3 times daily with HD -Continue Aranesp per nephrology    Depression/Anxiety: -Continue Seroquel nightly and Klonopin daily   Type 2 diabetes: Patient's blood sugars have improved substantially since decreasing her Lantus to 3 units.  Current blood sugars are in the low to mid 100s. -3 units Lantus with sensitive sliding scale  High risk for malnutrition: Deconditioning  -Continue p.o. intake as she appears to be getting enough calories.  -CR consulted for inpatient rehab. -All medications switch back to oral route at this time.   Dispo: Anticipated discharge to CIR pending provement.  Diet: Normal IVF: None,None VTE: Heparin Code: DNR PT/OT recs: LTAC, none. TOC recs: pending    Lawerance Cruel, D.O.  Internal Medicine Resident, PGY-2 [facility]  Pager: [phone] 2:05 PM, 05/10/2020   Please contact the on call pager after 5 pm and on weekends at [phone].

## 2020-05-10 NOTE — Progress Notes (Signed)
   05/10/20 1120  Vitals  Temp 98.3 F (36.8 C)  Temp Source Oral  BP 102/66  BP Location Right Leg  BP Method Automatic  Patient Position (if appropriate) Lying  Pulse Rate (!) 105  Pulse Rate Source Monitor  ECG Heart Rate (!) 115  Resp (!) 25  Oxygen Therapy  SpO2 98 %  O2 Device Tracheostomy Collar  O2 Flow Rate (L/min) 5 L/min  FiO2 (%) 28 %  Post-Hemodialysis Assessment  Rinseback Volume (mL) 250 mL  KECN 257 V  Dialyzer Clearance Heavily streaked  Duration of HD Treatment -hour(s) 4 hour(s)  Hemodialysis Intake (mL) 1000 mL  UF Total -Machine (mL) 3500 mL  Net UF (mL) 2500 mL  Tolerated HD Treatment Yes  Hemodialysis Catheter Left Internal jugular Double lumen Permanent (Tunneled)  Placement Date/Time: 02/27/20 1844   Time Out: Correct patient;Correct site;Correct procedure  Maximum sterile barrier precautions: Hand hygiene;Cap;Mask;Sterile gown;Large sterile sheet  Site Prep: Chlorhexidine (preferred)  Local Anesthetic: Injecta...  Site Condition No complications  Blue Lumen Status Flushed;Heparin locked;Capped (Central line)  Red Lumen Status Flushed;Heparin locked;Capped (Central line)  Catheter fill solution Heparin 1000 units/ml  Catheter fill volume (Arterial) 2.1 cc  Catheter fill volume (Venous) 2.1  Dressing Type Occlusive  Dressing Status Clean;Dry;Intact  Antimicrobial disc in place? Yes  Drainage Description None  Dressing Change Due 05/15/20  Post treatment catheter status Capped and Clamped  HD tx complete, system clotted x1.

## 2020-05-10 NOTE — Progress Notes (Signed)
Physical Therapy Treatment Patient Details Name: Wanda Ramirez MRN: [id number] DOB: 1970-07-07 Today's Date: 05/10/2020    History of Present Illness 49 yo admitted 9/13 with Covid PNA, intubated 9/24-10/6, reintubated 10/8, CRRT 9/26-10/7, trach and bronch 10/18. Pt returned to vent 10/30-11/7. Pt with significant sacral wound to bone s/p I&D x 2. Pt with hemorrhage from wound 11/14, hemorrhagic shock 11/15.Wound VAc replaced 12/8. PMhx: ESRD TTS, obesity, HTN, GERD, hypothyroidism, DM, asthma    PT Comments    Pt shows increased endurance and tolerance to sit to stand transfers, with increased time in weightbearing stance, despite just getting back from dialysis. Continues to benefit from using [place], as she shows more independence with this. Pt sat in chair for 30 minutes yesterday but today refused sitting in chair, stating she was too tired. Will continue to work on weightbearing strength and endurance and transfer ability.   Follow Up Recommendations  CIR;Supervision/Assistance - 24 hour     Equipment Recommendations  Wheelchair (measurements PT);Wheelchair cushion (measurements PT);Rolling walker with 5" wheels;Hospital bed    Recommendations for Other Services       Precautions / Restrictions Precautions Precautions: Fall Precaution Comments: trach, sacral wound, VAC    Mobility  Bed Mobility Overal bed mobility: Needs Assistance Bed Mobility: Supine to Sit;Sit to Supine     Supine to sit: Min guard;HOB elevated Sit to supine: Min assist   General bed mobility comments: min assist for leg elevation with sit to supine to get back into bed; min assist +2 to scoot towards HOB  Transfers Overall transfer level: Needs assistance   Transfers: Sit to/from Stand Sit to Stand: Min guard;Min assist;+2 safety/equipment;+2 physical assistance         General transfer comment: pt performed 5 sit to stands, 4 of them standing for 10 seconds and 1 for 20 seconds. First sit  to stand was min assist +2 for power up, other 4 were min guard +2  Ambulation/Gait             General Gait Details: not attempted   Stairs             Wheelchair Mobility    Modified Rankin (Stroke Patients Only)       Balance Overall balance assessment: Needs assistance Sitting-balance support: Feet supported Sitting balance-Leahy Scale: Fair Sitting balance - Comments: pt able to maintain EOB sitting without bilateral UE support and was min guard   Standing balance support: Bilateral upper extremity supported Standing balance-Leahy Scale: Poor Standing balance comment: use of stedy with cues for hip extension into upright without physical assist 10-20 sec                            Cognition Arousal/Alertness: Awake/alert Behavior During Therapy: WFL for tasks assessed/performed Overall Cognitive Status: Within Functional Limits for tasks assessed                                        Exercises      General Comments        Pertinent Vitals/Pain Pain Score: 6  Pain Location: L buttock sitting EOB Pain Descriptors / Indicators: Grimacing;Discomfort Pain Intervention(s): Limited activity within patient's tolerance;Monitored during session;Repositioned    Home Living  Prior Function            PT Goals (current goals can now be found in the care plan section) Progress towards PT goals: Progressing toward goals    Frequency    Min 3X/week      PT Plan Current plan remains appropriate    Co-evaluation              AM-PAC PT "6 Clicks" Mobility   Outcome Measure  Help needed turning from your back to your side while in a flat bed without using bedrails?: A Little Help needed moving from lying on your back to sitting on the side of a flat bed without using bedrails?: A Little Help needed moving to and from a bed to a chair (including a wheelchair)?: Total Help needed  standing up from a chair using your arms (e.g., wheelchair or bedside chair)?: A Little Help needed to walk in hospital room?: Total Help needed climbing 3-5 steps with a railing? : Total 6 Click Score: 12    End of Session Equipment Utilized During Treatment: Oxygen Activity Tolerance: Patient tolerated treatment well Patient left: in bed;with call bell/phone within reach;with family/visitor present Nurse Communication: Mobility status;Need for lift equipment PT Visit Diagnosis: Muscle weakness (generalized) (M62.81);Difficulty in walking, not elsewhere classified (R26.2);Pain     Time: 6945-0388 PT Time Calculation (min) (ACUTE ONLY): 19 min  Charges:  $Therapeutic Activity: 8-22 mins                     Caleb Popp, SPT [id number]   Wanda Ramirez 05/10/2020, 2:00 PM

## 2020-05-10 NOTE — Progress Notes (Signed)
  Speech Language Pathology Treatment: [name] Speaking valve  Patient Details Name: Wanda Ramirez MRN: [id number] DOB: May 01, 1971 Today's Date: 05/10/2020 Time: 5732-2567 SLP Time Calculation (min) (ACUTE ONLY): 16 min  Assessment / Plan / Recommendation Clinical Impression  Pt was seen for treatment and was cooperative during the session. PMSV was in place at the onset of the session and pt tolerated it for the duration of the session with stable vitals. Pt admitted that she did not practice donning and doffing the PMSV since the last session due to exhaustion after having HD. She demonstrated understanding regarding PMSV cleaning and storage. Despite her not having practiced donning and doffing since the last session, pt was independent with this even in the absence of visual input and was able to consistently demonstrate it without difficulty. Pt indicated that she is comfortable with her level of independence, but stated that she will still intermittently practice to further improve efficiency. Further skilled SLP services are not clinically indicated at this time.    HPI HPI: 49 year old female with PMH of with end-stage renal disease on hemodialysis and chronic lung disease on chronic supplemental oxygen at 3 L/min. Admitted 9/13 with acute on chronic hypoxic respiratory failure due to COVID-19 pneumonia. Intubated 9/24-10/6, reintubated 10/8, CRRT 9/26-10/7, trach and bronch 10/18, trach collar 11/8. PMV trials began 10/27 with minimal toleration, SLP discharged 11/3 due to medical decline. Reconsulted 11/10. Pt currently has uncuffed Shiley 6 XLT      SLP Plan  Discharge SLP treatment due to (comment);All goals met       Recommendations  Diet recommendations: Regular;Thin liquid Liquids provided via: Straw;Cup Medication Administration: Whole meds with liquid Supervision: Patient able to self feed;Intermittent supervision to cue for compensatory strategies Compensations: Slow  rate;Small sips/bites Postural Changes and/or Swallow Maneuvers: Seated upright 90 degrees      Patient may use Passy-Muir Speech Valve: During all waking hours (remove during sleep) PMSV Supervision: Intermittent MD: Please consider changing trach tube to : Smaller size         Oral Care Recommendations: Oral care BID Follow up Recommendations: Inpatient Rehab SLP Visit Diagnosis: Aphonia (R49.1) Plan: Discharge SLP treatment due to (comment);All goals met       Mercy Leppla I. Hardin Negus, [place], [place] Office number [phone] Pager [place] 05/10/2020, 5:47 PM

## 2020-05-11 LAB — GLUCOSE, CAPILLARY
Glucose-Capillary: 111 mg/dL — ABNORMAL HIGH (ref 70–99)
Glucose-Capillary: 110 mg/dL — ABNORMAL HIGH (ref 70–99)
Glucose-Capillary: 210 mg/dL — ABNORMAL HIGH (ref 70–99)
Glucose-Capillary: 175 mg/dL — ABNORMAL HIGH (ref 70–99)
Glucose-Capillary: 183 mg/dL — ABNORMAL HIGH (ref 70–99)

## 2020-05-11 LAB — RENAL FUNCTION PANEL
Albumin: 2.1 g/dL — ABNORMAL LOW (ref 3.5–5.0)
Anion gap: 13 (ref 5–15)
BUN: 31 mg/dL — ABNORMAL HIGH (ref 6–20)
CO2: 26 mmol/L (ref 22–32)
Calcium: 9.5 mg/dL (ref 8.9–10.3)
Chloride: 99 mmol/L (ref 98–111)
Creatinine, Ser: 3.68 mg/dL — ABNORMAL HIGH (ref 0.44–1.00)
GFR, Estimated: 14 mL/min — ABNORMAL LOW (ref >60)
Glucose, Bld: 153 mg/dL — ABNORMAL HIGH (ref 70–99)
Phosphorus: 4.5 mg/dL (ref 2.5–4.6)
Potassium: 4.5 mmol/L (ref 3.5–5.1)
Sodium: 138 mmol/L (ref 135–145)

## 2020-05-11 MED ORDER — CHLORHEXIDINE GLUCONATE CLOTH 2 % EX PADS
6.0000 | MEDICATED_PAD | Freq: Every day | CUTANEOUS | Status: DC
  Administered 2020-05-11 – 2020-05-15 (×5): 6 via TOPICAL

## 2020-05-11 NOTE — Progress Notes (Signed)
[name] NEPHROLOGY PROGRESS NOTE  Assessment/ Plan: Pt is a 49 y.o. yo female  w/ ESRD on TTS HD admitted for COVID PNA on 02/13/20. She was intubated. SP CRRT around 9/26- 10/7. Now is on intermittent HD MWF here. She is sp trach. She had MSSA pna. DM2.Then she had large sacral decub requiring I&D.   OP HD:TTS  4h 81mn 450/800 2/2.25 bath 111.5kg Hep 2000 L AVF - darbe 50 ug q week, last 9/7 - calc 1.0 tiw - 9/9 Hb 10.0, tsat 22%  # COVID pna sp trach complicated by MSSA PNA - off vent on trach collar.  #Hypotension - BP's stable on midodrine 10 tid. Monitor BP.  #Stage IV sacral decub:- sp I&D x 2.  On meropenem. wound care, general surgery is following.  #ESRD - S/P CRRT, now HD TTS.   HD yesterday with 2.5 L UF, plan for next HD tomorrow.  #Anemia ckd/ critical illness - on darbe 150 weekly, 11/12 Ferritin >4000, iron sat 11. With active infection and ^^ ferritin no IV iron. tx prbc's PRN. Rechecked iron panel-- Ferritin 2777 and %sat 18, hold off on IV iron for now. Transfuse prn.  #MBD ckd -monitor Ca and phos level, started sevelamer. Monitor.   # HD access: LUA AVF clotted here during acute covid. TDC x2 per IR  #Dispo: Considering inpatient rehab.  Otherwise she will need LTAC because of trach and dialysis need.  Subjective: Seen and examined.  No new event.  Denies nausea vomiting chest pain or shortness of breath. Objective Vital signs in last 24 hours: Vitals:   05/10/20 2324 05/11/20 0531 05/11/20 0734 05/11/20 0819  BP: 130/69 130/80 (!) 126/94 137/90  Pulse: 95 86 (!) 101   Resp: _0 Temp: 99.8 F (37.7 C) 99.5 F (37.5 C) 98.3 F (36.8 C)   TempSrc: Oral Oral Oral   SpO2: 100% 100% 97%   Weight:      Height:       Weight change:   Intake/Output Summary (Last 24 hours) at 05/11/2020 0919 Last data filed at 05/10/2020 2000 Gross per 24 hour  Intake 240 ml  Output 2600 ml  Net -2360 ml       Labs: Basic  Metabolic Panel: Recent Labs  Lab 05/09/20 0356 05/10/20 0241 05/11/20 0259  NA 135 137 138  K 4.6 4.9 4.5  CL 97* 98 99  CO2 _1 GLUCOSE 114* 130* 153*  BUN 34* 64* 31*  CREATININE 3.48* 5.27* 3.68*  CALCIUM 9.0 10.1 9.5  PHOS 4.3 6.5* 4.5   Liver Function Tests: Recent Labs  Lab 05/09/20 0356 05/10/20 0241 05/11/20 0259  ALBUMIN 1.9* 2.0* 2.1*   No results for input(s): LIPASE, AMYLASE in the last 168 hours. No results for input(s): AMMONIA in the last 168 hours. CBC: Recent Labs  Lab 05/05/20 0516 05/07/20 0310 05/08/20 0718 05/10/20 0629  WBC 10.3 8.0 9.8 9.5  HGB 7.8* 7.4* 8.1* 7.2*  HCT 23.1* 24.3* 25.2* 23.6*  MCV 86.5 89.7 88.7 89.1  PLT 487* 533* 577* 489*   Cardiac Enzymes: No results for input(s): CKTOTAL, CKMB, CKMBINDEX, TROPONINI in the last 168 hours. CBG: Recent Labs  Lab 05/10/20 1635 05/10/20 1919 05/10/20 2322 05/11/20 0519 05/11/20 0711  GLUCAP 194* 159* 148* 111* 110*    Iron Studies: No results for input(s): IRON, TIBC, TRANSFERRIN, FERRITIN in the last 72 hours. Studies/Results: No results found.  Medications: Infusions: . sodium chloride 250 mL (  04/08/20 2040)  . meropenem (MERREM) IV 500 mg (05/10/20 1804)    Scheduled Medications: . B-complex with vitamin C  1 tablet Oral Daily  . chlorhexidine gluconate (MEDLINE KIT)  15 mL Mouth Rinse BID  . Chlorhexidine Gluconate Cloth  6 each Topical Daily  . Chlorhexidine Gluconate Cloth  6 each Topical Q0600  . clonazepam  0.25 mg Oral Daily  . collagenase   Topical Daily  . darbepoetin (ARANESP) injection - DIALYSIS  150 mcg Intravenous Q Thu-HD  . feeding supplement (GLUCERNA SHAKE)  237 mL Oral TID WC  . heparin injection (subcutaneous)  5,000 Units Subcutaneous Q8H  . hydrocerin   Topical Daily  . insulin aspart  0-6 Units Subcutaneous Q4H  . insulin glargine  3 Units Subcutaneous Daily  . levothyroxine  125 mcg Oral Q0600  . midodrine  10 mg Oral TID WC  .  multivitamin  1 tablet Oral QHS  . nutrition supplement (JUVEN)  1 packet Oral BID BM  . oxyCODONE  10 mg Oral Q6H  . pantoprazole  40 mg Oral Daily  . QUEtiapine  50 mg Oral QHS  . sevelamer carbonate  1.6 g Oral TID WC  . sodium chloride flush  10-40 mL Intracatheter Q12H    have reviewed scheduled and prn medications.  Physical Exam: General: Comfortable, has tracheostomy Heart:RRR, s1s2 nl Lungs: Bibasal rhonchi and some coarse breath sound Abdomen:soft, Non-tender, non-distended Extremities: No edema Dialysis Access: Left IJ tunnel HD catheter  Wanda Ramirez Wanda Ramirez 05/11/2020,9:19 AM  LOS: 87 days  Pager: [phone]

## 2020-05-11 NOTE — Progress Notes (Signed)
Occupational Therapy Treatment Patient Details Name: Wanda Ramirez MRN: [id number] DOB: 06-Jan-1971 Today's Date: 05/11/2020    History of present illness 49 yo admitted 9/13 with Covid PNA, intubated 9/24-10/6, reintubated 10/8, CRRT 9/26-10/7, trach and bronch 10/18. Pt returned to vent 10/30-11/7. Pt with significant sacral wound to bone s/p I&D x 2. Pt with hemorrhage from wound 11/14, hemorrhagic shock 11/15.Wound VAc replaced 12/8. PMhx: ESRD TTS, obesity, HTN, GERD, hypothyroidism, DM, asthma   OT comments  Pt. Motivated and eager for participation in skilled OT today.  Able to perform bed mobility in/out of bed and rolling with min guard for line and tube management.  Seated UE exercises as part of HEP. Also performed seated push ups for ue/le strengthening in prep for cont. Progression for functional mobility and transfers.  Remains CIR candidate as she would benefit from cont. Therapies prior to home.  Great attitude and super motivated for participation "okay what do you want me to do next".  Speaking like this throughout session with a smile! Visibly proud when she was able to perform her exercises.    Follow Up Recommendations  CIR    Equipment Recommendations  Other (comment)    Recommendations for Other Services      Precautions / Restrictions Precautions Precautions: Fall Precaution Comments: trach, sacral wound, VAC       Mobility Bed Mobility Overal bed mobility: Needs Assistance Bed Mobility: Rolling;Supine to Sit;Sit to Supine;Sit to Sidelying;Sidelying to Sit Rolling: Min guard Sidelying to sit: Min guard Supine to sit: Min guard Sit to supine: Min guard Sit to sidelying: Min guard General bed mobility comments: min guard for tube/line management. able to transition to eob and return to sidelying/supine without physical assistance  Transfers Overall transfer level: Needs assistance   Transfers: Sit to/from Stand Sit to Stand: Min guard          General transfer comment: pt. seated eob. b feet support, pt. able to perform partial stands 10 x 2 sets. tolerated well able to bring buttocks off of the bed to partial stand    Balance                                           ADL either performed or assessed with clinical judgement   ADL                                               Vision       Perception     Praxis      Cognition Arousal/Alertness: Awake/alert Behavior During Therapy: WFL for tasks assessed/performed Overall Cognitive Status: Within Functional Limits for tasks assessed                                          Exercises General Exercises - Upper Extremity Shoulder Flexion: 10 reps;AAROM;Both;Seated Shoulder Extension: AAROM;Both;10 reps;Seated Shoulder Horizontal ABduction: AAROM;10 reps;Both;Seated Shoulder Horizontal ADduction: AAROM;10 reps;Both;Seated Elbow Flexion: AAROM;Both;10 reps;Seated Elbow Extension: AAROM;Both;10 reps;Seated   Shoulder Instructions       General Comments      Pertinent Vitals/ Pain       Pain Assessment: No/denies pain  Home  Living                                          Prior Functioning/Environment              Frequency  Min 2X/week        Progress Toward Goals  OT Goals(current goals can now be found in the care plan section)  Progress towards OT goals: Progressing toward goals     Plan Discharge plan remains appropriate    Co-evaluation                 AM-PAC OT "6 Clicks" Daily Activity     Outcome Measure   Help from another person eating meals?: A Little Help from another person taking care of personal grooming?: A Little Help from another person toileting, which includes using toliet, bedpan, or urinal?: A Lot Help from another person bathing (including washing, rinsing, drying)?: A Lot Help from another person to put on and taking off regular upper  body clothing?: A Lot Help from another person to put on and taking off regular lower body clothing?: A Lot 6 Click Score: 14    End of Session Equipment Utilized During Treatment: Oxygen  OT Visit Diagnosis: Other abnormalities of gait and mobility (R26.89);Muscle weakness (generalized) (M62.81);Other symptoms and signs involving cognitive function   Activity Tolerance Patient tolerated treatment well   Patient Left in bed;with call bell/phone within reach   Nurse Communication          Time: [phone] OT Time Calculation (min): 14 min  Charges: OT General Charges $OT Visit: 1 Visit OT Treatments $Therapeutic Exercise: 8-22 mins  Sonia Baller, COTA/L Acute Rehabilitation [phone]   Janice Coffin 05/11/2020, 9:23 AM

## 2020-05-11 NOTE — Progress Notes (Signed)
38 Days Post-Op  Subjective: CC: No complaints this AM. Seen with WOCN for vac change.   Objective: Vital signs in last 24 hours: Temp:  [98.3 F (36.8 C)-99.9 F (37.7 C)] 98.3 F (36.8 C) (12/10 0734) Pulse Rate:  [86-124] 101 (12/10 0734) Resp:  [12-25] 20 (12/10 0734) BP: (102-130)/(54-94) 126/94 (12/10 0734) SpO2:  [97 %-100 %] 97 % (12/10 0734) FiO2 (%):  [28 %] 28 % (12/09 2322) Last BM Date: 05/01/20  Intake/Output from previous day: 12/09 0701 - 12/10 0700 In: 240 [P.O.:240] Out: 2600 [Drains:100] Intake/Output this shift: No intake/output data recorded.  PE: Buttock wound: Wound vac in place with SS like output in cannister. This was removed. 100cc/24 hours. The wound is overall healthy with > 90% granulation tissue at the base. There is one skin flap as seen in the picture below with some fibrinous tissue. There is tunneling that extends down left thigh (see picture #2) measuring ~9cm There is some SS drainage in the dependent, medial aspect of the wound that is seen in picture 3. This are does track ~8cm from skin level anterior/superior.            Please see WOCN for description of wound.   Lab Results:  Recent Labs    05/10/20 0629  WBC 9.5  HGB 7.2*  HCT 23.6*  PLT 489*   BMET Recent Labs    05/10/20 0241 05/11/20 0259  NA 137 138  K 4.9 4.5  CL 98 99  CO2 25 26  GLUCOSE 130* 153*  BUN 64* 31*  CREATININE 5.27* 3.68*  CALCIUM 10.1 9.5   PT/INR No results for input(s): LABPROT, INR in the last 72 hours. CMP     Component Value Date/Time   NA 138 05/11/2020 0259   K 4.5 05/11/2020 0259   CL 99 05/11/2020 0259   CO2 26 05/11/2020 0259   GLUCOSE 153 (H) 05/11/2020 0259   BUN 31 (H) 05/11/2020 0259   CREATININE 3.68 (H) 05/11/2020 0259   CALCIUM 9.5 05/11/2020 0259   PROT 5.9 (L) 04/13/2020 0500   ALBUMIN 2.1 (L) 05/11/2020 0259   AST 25 04/13/2020 0500   ALT 25 04/13/2020 0500   ALKPHOS 316 (H) 04/13/2020 0500   BILITOT  0.8 04/13/2020 0500   GFRNONAA 14 (L) 05/11/2020 0259   GFRAA 33 (L) 03/05/2020 1703   Lipase  No results found for: LIPASE     Studies/Results: No results found.  Anti-infectives: Anti-infectives (From admission, onward)   Start     Dose/Rate Route Frequency Ordered Stop   05/03/20 2000  ampicillin (OMNIPEN) 2 g in sodium chloride 0.9 % 100 mL IVPB  Status:  Discontinued        2 g 300 mL/hr over 20 Minutes Intravenous Every 24 hours 05/03/20 1610 05/03/20 1629   05/03/20 1800  meropenem (MERREM) 500 mg in sodium chloride 0.9 % 100 mL IVPB        500 mg 200 mL/hr over 30 Minutes Intravenous Every 24 hours 05/03/20 1610 05/13/20 1759   05/03/20 1800  ampicillin (OMNIPEN) 2 g in sodium chloride 0.9 % 100 mL IVPB  Status:  Discontinued        2 g 300 mL/hr over 20 Minutes Intravenous Every 24 hours 05/03/20 1629 05/04/20 0831   04/12/20 1800  meropenem (MERREM) 500 mg in sodium chloride 0.9 % 100 mL IVPB  Status:  Discontinued        500 mg 200 mL/hr over 30  Minutes Intravenous Every 24 hours 04/12/20 1746 04/14/20 1426   04/06/20 1200  vancomycin (VANCOCIN) IVPB 1000 mg/200 mL premix        1,000 mg 200 mL/hr over 60 Minutes Intravenous Every Fri (Hemodialysis) 04/05/20 1529 04/06/20 1300   04/04/20 1200  vancomycin (VANCOCIN) IVPB 1000 mg/200 mL premix        1,000 mg 200 mL/hr over 60 Minutes Intravenous Every M-W-F (Hemodialysis) 04/04/20 1014 04/04/20 1423   04/03/20 0000  piperacillin-tazobactam (ZOSYN) IVPB 2.25 g  Status:  Discontinued        2.25 g 100 mL/hr over 30 Minutes Intravenous Every 8 hours 04/02/20 2253 04/09/20 1559   04/02/20 2345  vancomycin (VANCOCIN) IVPB 1000 mg/200 mL premix        1,000 mg 200 mL/hr over 60 Minutes Intravenous  Once 04/02/20 2251 04/03/20 0236   04/02/20 2251  vancomycin variable dose per unstable renal function (pharmacist dosing)  Status:  Discontinued         Does not apply See admin instructions 04/02/20 2251 04/07/20 1037    04/02/20 1200  ceFAZolin (ANCEF) IVPB 1 g/50 mL premix  Status:  Discontinued        1 g 100 mL/hr over 30 Minutes Intravenous Every 24 hours 04/02/20 1030 04/02/20 2251   04/01/20 1300  vancomycin (VANCOCIN) 2,000 mg in sodium chloride 0.9 % 500 mL IVPB  Status:  Discontinued        2,000 mg 250 mL/hr over 120 Minutes Intravenous  Once 04/01/20 1203 04/01/20 1203   04/01/20 1300  vancomycin (VANCOREADY) IVPB 2000 mg/400 mL        2,000 mg 200 mL/hr over 120 Minutes Intravenous Every 12 hours 04/01/20 1205 04/01/20 1715   04/01/20 1300  vancomycin (VANCOCIN) 2,000 mg in sodium chloride 0.9 % 500 mL IVPB  Status:  Discontinued        2,000 mg 250 mL/hr over 120 Minutes Intravenous  Once 04/01/20 1207 04/01/20 1208   04/01/20 1245  vancomycin (VANCOCIN) 2,000 mg in sodium chloride 0.9 % 500 mL IVPB  Status:  Discontinued        2,000 mg 250 mL/hr over 120 Minutes Intravenous  Once 04/01/20 1154 04/01/20 1206   04/01/20 1154  vancomycin variable dose per unstable renal function (pharmacist dosing)  Status:  Discontinued         Does not apply See admin instructions 04/01/20 1154 04/02/20 1030   03/11/20 1200  cefTRIAXone (ROCEPHIN) 2 g in sodium chloride 0.9 % 100 mL IVPB        2 g 200 mL/hr over 30 Minutes Intravenous Every 24 hours 03/11/20 1107 03/17/20 1307   03/10/20 1200  piperacillin-tazobactam (ZOSYN) IVPB 3.375 g  Status:  Discontinued        3.375 g 12.5 mL/hr over 240 Minutes Intravenous Every 12 hours 03/10/20 1117 03/10/20 1245   03/10/20 1130  vancomycin (VANCOCIN) IVPB 1000 mg/200 mL premix        1,000 mg 200 mL/hr over 60 Minutes Intravenous  Once 03/10/20 1117 03/10/20 1258   03/10/20 1000  vancomycin variable dose per unstable renal function (pharmacist dosing)  Status:  Discontinued         Does not apply See admin instructions 03/09/20 1412 03/14/20 0810   03/09/20 2300  piperacillin-tazobactam (ZOSYN) IVPB 2.25 g  Status:  Discontinued        2.25 g 100 mL/hr over 30  Minutes Intravenous Every 8 hours 03/09/20 1412 03/10/20 1117   03/09/20  1415  piperacillin-tazobactam (ZOSYN) IVPB 3.375 g        3.375 g 100 mL/hr over 30 Minutes Intravenous  Once 03/09/20 1412 03/09/20 1537   03/09/20 1415  vancomycin (VANCOCIN) 2,250 mg in sodium chloride 0.9 % 500 mL IVPB        2,250 mg 250 mL/hr over 120 Minutes Intravenous  Once 03/09/20 1412 03/09/20 1639   02/28/20 2200  ceFEPIme (MAXIPIME) 1 g in sodium chloride 0.9 % 100 mL IVPB        1 g 200 mL/hr over 30 Minutes Intravenous Every 12 hours 02/28/20 1305 02/29/20 2220   02/28/20 0907  ceFEPIme (MAXIPIME) 1 g in sodium chloride 0.9 % 100 mL IVPB  Status:  Discontinued        1 g 200 mL/hr over 30 Minutes Intravenous Every 24 hours 02/27/20 0939 02/28/20 1305   02/26/20 2200  ceFEPIme (MAXIPIME) 2 g in sodium chloride 0.9 % 100 mL IVPB  Status:  Discontinued        2 g 200 mL/hr over 30 Minutes Intravenous Every 12 hours 02/26/20 1519 02/27/20 0939   02/25/20 1200  ceFEPIme (MAXIPIME) 2 g in sodium chloride 0.9 % 100 mL IVPB  Status:  Discontinued        2 g 200 mL/hr over 30 Minutes Intravenous Every T-Th-Sa (Hemodialysis) 02/25/20 0955 02/26/20 1519   02/24/20 1904  azithromycin (ZITHROMAX) 500 mg in sodium chloride 0.9 % 250 mL IVPB  Status:  Discontinued        500 mg 250 mL/hr over 60 Minutes Intravenous Every 24 hours 02/24/20 1904 02/26/20 0815   02/24/20 1900  azithromycin (ZITHROMAX) 250 mg in dextrose 5 % 125 mL IVPB  Status:  Discontinued        250 mg 125 mL/hr over 60 Minutes Intravenous Every 24 hours 02/24/20 1845 02/24/20 1904   02/24/20 1800  ceFEPIme (MAXIPIME) 2 g in sodium chloride 0.9 % 100 mL IVPB  Status:  Discontinued        2 g 200 mL/hr over 30 Minutes Intravenous Every Fri (Hemodialysis) 02/24/20 1326 02/25/20 0942   02/22/20 1745  ceFAZolin (ANCEF) IVPB 2g/100 mL premix        2 g 200 mL/hr over 30 Minutes Intravenous  Once 02/22/20 1744 02/22/20 1931   02/22/20 0600  ceFAZolin  (ANCEF) IVPB 2g/100 mL premix        2 g 200 mL/hr over 30 Minutes Intravenous To Radiology 02/21/20 1658 02/22/20 1900   02/22/20 0000  ceFAZolin (ANCEF) IVPB 1 g/50 mL premix  Status:  Discontinued        1 g 100 mL/hr over 30 Minutes Intravenous To Radiology 02/21/20 1645 02/21/20 1658   02/21/20 1800  azithromycin (ZITHROMAX) tablet 250 mg  Status:  Discontinued       "Followed by" Linked Group Details   250 mg Oral Daily-1800 02/20/20 1658 02/24/20 1852   02/21/20 1800  ceFEPIme (MAXIPIME) 2 g in sodium chloride 0.9 % 100 mL IVPB  Status:  Discontinued        2 g 200 mL/hr over 30 Minutes Intravenous Every T-Th-Sa (1800) 02/20/20 1737 02/24/20 1326   02/20/20 1800  azithromycin (ZITHROMAX) tablet 500 mg       "Followed by" Linked Group Details   500 mg Oral Daily 02/20/20 1658 02/20/20 1731   02/20/20 1745  ceFEPIme (MAXIPIME) 1 g in sodium chloride 0.9 % 100 mL IVPB        1 g 200  mL/hr over 30 Minutes Intravenous STAT 02/20/20 1737 02/21/20 0747   02/19/20 1145  remdesivir 100 mg in sodium chloride 0.9 % 100 mL IVPB        100 mg 200 mL/hr over 30 Minutes Intravenous Daily 02/19/20 1131 02/19/20 1443   02/15/20 1000  remdesivir 100 mg in sodium chloride 0.9 % 100 mL IVPB  Status:  Discontinued       "Followed by" Linked Group Details   100 mg 200 mL/hr over 30 Minutes Intravenous Daily 02/14/20 0944 02/14/20 0951   02/15/20 1000  remdesivir 100 mg in sodium chloride 0.9 % 100 mL IVPB        100 mg 200 mL/hr over 30 Minutes Intravenous Daily 02/14/20 0952 02/17/20 1102   02/14/20 1030  remdesivir 100 mg in sodium chloride 0.9 % 100 mL IVPB        100 mg 200 mL/hr over 30 Minutes Intravenous Every 30 min 02/14/20 0952 02/14/20 1129   02/14/20 0945  remdesivir 200 mg in sodium chloride 0.9% 250 mL IVPB  Status:  Discontinued       "Followed by" Linked Group Details   200 mg 580 mL/hr over 30 Minutes Intravenous Once 02/14/20 0944 02/14/20 0951       Assessment/Plan Morbid  obesityBMI 42.02 Hypothyroidism HTN GERD ESRDon HD DM Asthma Chronic respiratory failure 2/2 Covid-19 (diagnosed 02/09/20) S/p tracheostomy Code status DNR  Necrotizing soft tissue infection S/pincision and debridement of sacrum, left gluteus11/2 Dr. Bobbye Morton - Afebrile, WBC WNL - Cont vac M/W/F. Given difficulty to place sponge in areas of tunneling, will check again Monday to ensure the wound is continuing to heal well.     LOS: 87 days    Jillyn Ledger , [facility] 05/11/2020, 8:06 AM Please see Amion for pager number during day hours 7:00am-4:30pm

## 2020-05-11 NOTE — Progress Notes (Signed)
Inpatient Rehabilitation Admissions Coordinator  I met with patient at bedside to discuss possible Cir admit hopefully next week when bed is available. I have encouraged her to speak to her daughters concerning her wishes to admit to CIR then return home with her daughters to assist.  Danne Baxter, RN, MSN Rehab Admissions Coordinator [phone] 05/11/2020 1:50 PM

## 2020-05-11 NOTE — Consult Note (Signed)
[place] Nurse wound follow up Patient receiving care in [facility] [id number]. Wound type: healing surgical wound to buttocks and sacrum Measurement: Wound bed:see images in [employer] note from today Drainage (amount, consistency, odor) 350 ml orange tinged in cannister Periwound: intact Dressing procedure/placement/frequency: All pieces of white and black foam removed from tunnel, pocket and wound bed.  One piece of white foam cut in half length wise and placed into tunnel on left buttock and right buttock. Wound filled with one piece of black foam. Drape applied, immediate seal obtained.  Area adjacent to the anus filled with a flattened barrier ring. Patient tolerated very well without premedication.  Supplies in room for Monday. [name], RN, MSN, CWOCN, CNS-BC, pager [phone]

## 2020-05-11 NOTE — Progress Notes (Signed)
Sitting up in bed eating breakfast. No complaints or distress at this time.

## 2020-05-11 NOTE — Progress Notes (Signed)
HD#87 Subjective:  Overnight Events: no events overnight.   Patient resting comfortably in bed.  She denies any new symptoms at this time.  Objective:  Vital signs in last 24 hours: Vitals:   05/10/20 2324 05/11/20 0531 05/11/20 0734 05/11/20 0819  BP: 130/69 130/80 (!) 126/94 137/90  Pulse: 95 86 (!) 101   Resp: 20 20 20    Temp: 99.8 F (37.7 C) 99.5 F (37.5 C) 98.3 F (36.8 C)   TempSrc: Oral Oral Oral   SpO2: 100% 100% 97%   Weight:      Height:       Supplemental O2: Trach collar SpO2: 97 % O2 Flow Rate (L/min): 5 L/min FiO2 (%): 28 %   Physical Exam:  Physical Exam Constitutional:      Appearance: Normal appearance.  HENT:     Head: Normocephalic and atraumatic.  Eyes:     Extraocular Movements: Extraocular movements intact.  Cardiovascular:     Rate and Rhythm: Normal rate.     Pulses: Normal pulses.     Heart sounds: Normal heart sounds.  Pulmonary:     Effort: Pulmonary effort is normal.     Breath sounds: Normal breath sounds.  Abdominal:     General: Bowel sounds are normal.     Palpations: Abdomen is soft.     Tenderness: There is no abdominal tenderness.  Musculoskeletal:        General: Normal range of motion.     Cervical back: Normal range of motion.     Right lower leg: No edema.     Left lower leg: No edema.  Skin:    General: Skin is warm and dry.  Neurological:     Mental Status: She is alert and oriented to person, place, and time. Mental status is at baseline.  Psychiatric:        Mood and Affect: Mood normal.     Filed Weights   05/02/20 0459  Weight: 104.9 kg     Intake/Output Summary (Last 24 hours) at 05/11/2020 1412 Last data filed at 05/10/2020 2000 Gross per 24 hour  Intake --  Output 100 ml  Net -100 ml   Net IO Since Admission: 20,003.62 mL [05/11/20 1412]  Pertinent Labs: CBC Latest Ref Rng & Units 05/10/2020 05/08/2020 05/07/2020  WBC 4.0 - 10.5 K/uL 9.5 9.8 8.0  Hemoglobin 12.0 - 15.0 g/dL 7.2(L) 8.1(L)  7.4(L)  Hematocrit 36.0 - 46.0 % 23.6(L) 25.2(L) 24.3(L)  Platelets 150 - 400 K/uL 489(H) 577(H) 533(H)    CMP Latest Ref Rng & Units 05/11/2020 05/10/2020 05/09/2020  Glucose 70 - 99 mg/dL 153(H) 130(H) 114(H)  BUN 6 - 20 mg/dL 31(H) 64(H) 34(H)  Creatinine 0.44 - 1.00 mg/dL 3.68(H) 5.27(H) 3.48(H)  Sodium 135 - 145 mmol/L 138 137 135  Potassium 3.5 - 5.1 mmol/L 4.5 4.9 4.6  Chloride 98 - 111 mmol/L 99 98 97(L)  CO2 22 - 32 mmol/L 26 25 26   Calcium 8.9 - 10.3 mg/dL 9.5 10.1 9.0  Total Protein 6.5 - 8.1 g/dL - - -  Total Bilirubin 0.3 - 1.2 mg/dL - - -  Alkaline Phos 38 - 126 U/L - - -  AST 15 - 41 U/L - - -  ALT 0 - 44 U/L - - -    Imaging: No results found.  Assessment/Plan:   Principal Problem:   COVID-19 Active Problems:   End stage renal disease on dialysis ([place])   Diabetes mellitus type 2 in obese (  HCC)   OSA (obstructive sleep apnea)   HCAP (healthcare-associated pneumonia)   Acute respiratory failure with hypoxemia (HCC)   Encephalopathy acute   ARDS (adult respiratory distress syndrome) (HCC)   Pressure injury of skin   Aspiration into airway   Status post tracheostomy ([place])   Fever   Septic shock (HCC)   Cellulitis of buttock   Palliative care encounter   Sacral wound   Patient Summary: Wanda Behring Alstonis a 49 y.o.with a pertinent PMH of OSA, morbid obesity, hypothyroidism, hypertension, GERD, ESRD, diabetes, asthma, who presented withshortness of breathand admitted for Covid pneumoniarequiring tracheostomy andcomplicated by prolonged hospital stay including subsequent MSSApneumoniaand necrotizing soft tissue infectionof her sacrum.  Chronic hypoxic respiratory failure s/p tracheostomy secondary to [id number] pneumonia complicated by MSSA pneumonia: -Continue 5 L trach collar as needed -Periodic suctioning for mucous plugs -Appreciate pulmonary managing patient's tracheostomy. Will defer to pulmonary for decannulation in the  future..  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 -Good drainage of serosanguineous fluid from wound VAC.  Continue wound VAC at this time. -Appreciate surgery and wound care's assistance -Continue pain management with oxycodone 10 mg every 6 hours and fentanyl every 2 hours as needed for severe pain/breakthrough/dressing changes  ESRD Patient has tunneled cath in place for HD -Continue HD per nephrology -Continue midodrine 10 mg 3 times daily with HD -Continue Aranespper nephrology   Depression/Anxiety: -Continue Seroquel nightly and Klonopin daily  Type 2 diabetes: Patient's blood sugars have improved substantially since decreasing her Lantus to 3 units.  Current blood sugars are in the low to mid 100s. -3 units Lantus with sensitive sliding scale  High risk for malnutrition: Deconditioning  -Continue p.o. intake as she appears to be getting enough calories. -CR consulted for inpatient rehab. -All medications switch back to oral route at this time.  Dispo: Anticipated discharge toCIRpending provement.  Diet:Normal YWV:PXTG,GYIR SWN:IOEVOJJ Code:DNR PT/OT recs:LTAC,none. TOC recs:pending  Lawerance Cruel, D.O.  Internal Medicine Resident, PGY-2 [facility]  Pager: [phone] 2:12 PM, 05/11/2020   Please contact the on call pager after 5 pm and on weekends at [phone].

## 2020-05-11 NOTE — Progress Notes (Signed)
Physical Therapy Treatment Patient Details Name: Wanda Ramirez MRN: [id number] DOB: 1970/11/18 Today's Date: 05/11/2020    History of Present Illness 49 yo admitted 9/13 with Covid PNA, intubated 9/24-10/6, reintubated 10/8, CRRT 9/26-10/7, trach and bronch 10/18. Pt returned to vent 10/30-11/7. Pt with significant sacral wound to bone s/p I&D x 2. Pt with hemorrhage from wound 11/14, hemorrhagic shock 11/15.Wound VAc replaced 12/8. PMhx: ESRD TTS, obesity, HTN, GERD, hypothyroidism, DM, asthma    PT Comments    Pt only required one person assist to stand to stedy standing frame today and transfer OOB to chair in standing on frame.  I think she is ready to progress to trails of standing with RW as well as pre gait weight shifting and stepping.  She may still require stedy for back to bed or when fatigued, but she is motivated to get stronger and go home.  VSS throughout despite reports of 2/4 DOE.  Pt on 28% TC 5L and PMV donned throughout session. She self reports she stayed up 30 mins last time, limited by the pain from her extensive sacral wound.  PT will continue to follow acutely for safe mobility progression.   Follow Up Recommendations  CIR     Equipment Recommendations  Rolling walker with 5" wheels;3in1 (PT);Hospital bed;Wheelchair (measurements PT);Wheelchair cushion (measurements PT);Other (comment) (air mattress)    Recommendations for Other Services       Precautions / Restrictions Precautions Precautions: Fall Precaution Comments: trach, sacral wound, VAC    Mobility  Bed Mobility Overal bed mobility: Needs Assistance Bed Mobility: Sidelying to Sit Rolling: Min guard Sidelying to sit: Min guard Supine to sit: Min guard Sit to supine: Min guard Sit to sidelying: Min guard General bed mobility comments: Min guard assist for safety to ensure she did not fall back into the bed.  Heavy use of railing for leverage at trunk.  Transfers Overall transfer level: Needs  assistance Equipment used: Ambulation equipment used Transfers: Sit to/from [facility] Sit to Stand: Min assist Stand pivot transfers: Min assist       General transfer comment: Min assist to stand up to stedy standing frame and min assist to stabilize as we turned in standing (seat down just in case) to recliner chair.  Repeated 3 more sit to stands from elevated recliner min assist each time, however, heavier min assist with fatigue.  Ambulation/Gait                 Stairs             Wheelchair Mobility    Modified Rankin (Stroke Patients Only)       Balance Overall balance assessment: Needs assistance Sitting-balance support: Feet supported;Bilateral upper extremity supported Sitting balance-Leahy Scale: Poor Sitting balance - Comments: reliant on UE support, when removed falls posteriorly.   Standing balance support: Bilateral upper extremity supported Standing balance-Leahy Scale: Poor Standing balance comment: needs external support from therapist and standing frame in standing.  I do believe we are ready to trial a RW EOB for weight shifting and pre gait, but still may need stedy for the transfer.                            Cognition Arousal/Alertness: Awake/alert Behavior During Therapy: WFL for tasks assessed/performed Overall Cognitive Status: Within Functional Limits for tasks assessed  General Comments: Not specifically tested, conversation was normal and processing speed/sequencing seemed intact.      Exercises General Exercises - Upper Extremity Shoulder Flexion: 10 reps;AAROM;Both;Seated Shoulder Extension: AAROM;Both;10 reps;Seated Shoulder Horizontal ABduction: AAROM;10 reps;Both;Seated Shoulder Horizontal ADduction: AAROM;10 reps;Both;Seated Elbow Flexion: AAROM;Both;10 reps;Seated Elbow Extension: AAROM;Both;10 reps;Seated General Exercises - Lower  Extremity Ankle Circles/Pumps: AROM;Both;20 reps Long Arc Quad: AROM;Both;10 reps;Other (comment) (5 sec holds) Hip Flexion/Marching: AROM;Both;10 reps    General Comments General comments (skin integrity, edema, etc.): Pt on 28% TC 5L, VSS despite 2/4 DOE, PMV in place throughout.      Pertinent Vitals/Pain Pain Assessment: Faces Faces Pain Scale: Hurts little more Pain Location: buttocks in sitting    Home Living                      Prior Function            PT Goals (current goals can now be found in the care plan section) Acute Rehab PT Goals Patient Stated Goal: to get home so she can see her grandbabies again Progress towards PT goals: Progressing toward goals    Frequency    Min 3X/week      PT Plan Current plan remains appropriate    Co-evaluation              AM-PAC PT "6 Clicks" Mobility   Outcome Measure  Help needed turning from your back to your side while in a flat bed without using bedrails?: A Little Help needed moving from lying on your back to sitting on the side of a flat bed without using bedrails?: A Little Help needed moving to and from a bed to a chair (including a wheelchair)?: A Little Help needed standing up from a chair using your arms (e.g., wheelchair or bedside chair)?: A Little Help needed to walk in hospital room?: Total Help needed climbing 3-5 steps with a railing? : Total 6 Click Score: 14    End of Session Equipment Utilized During Treatment: Oxygen Activity Tolerance: Patient limited by fatigue;Patient limited by pain Patient left: in chair;with call bell/phone within reach;with chair alarm set Nurse Communication: Mobility status;Other (comment);Need for lift equipment (to RN tech) PT Visit Diagnosis: Muscle weakness (generalized) (M62.81);Difficulty in walking, not elsewhere classified (R26.2);Pain Pain - Right/Left: Left Pain - part of body: Hip     Time: 5056-9794 PT Time Calculation (min) (ACUTE  ONLY): 32 min  Charges:  $Therapeutic Exercise: 8-22 mins $Therapeutic Activity: 8-22 mins                    Verdene Lennert, PT, DPT  Acute Rehabilitation [phone] pager [phone]) [phone] office

## 2020-05-12 LAB — RENAL FUNCTION PANEL
Albumin: 2.1 g/dL — ABNORMAL LOW (ref 3.5–5.0)
Anion gap: 14 (ref 5–15)
BUN: 54 mg/dL — ABNORMAL HIGH (ref 6–20)
CO2: 24 mmol/L (ref 22–32)
Calcium: 10.3 mg/dL (ref 8.9–10.3)
Chloride: 100 mmol/L (ref 98–111)
Creatinine, Ser: 6.18 mg/dL — ABNORMAL HIGH (ref 0.44–1.00)
GFR, Estimated: 8 mL/min — ABNORMAL LOW (ref >60)
Glucose, Bld: 190 mg/dL — ABNORMAL HIGH (ref 70–99)
Phosphorus: 6.1 mg/dL — ABNORMAL HIGH (ref 2.5–4.6)
Potassium: 4.7 mmol/L (ref 3.5–5.1)
Sodium: 138 mmol/L (ref 135–145)

## 2020-05-12 LAB — CBC
HCT: 23.3 % — ABNORMAL LOW (ref 36.0–46.0)
Hemoglobin: 7.2 g/dL — ABNORMAL LOW (ref 12.0–15.0)
MCH: 27.9 pg (ref 26.0–34.0)
MCHC: 30.9 g/dL (ref 30.0–36.0)
MCV: 90.3 fL (ref 80.0–100.0)
Platelets: 449 10*3/uL — ABNORMAL HIGH (ref 150–400)
RBC: 2.58 MIL/uL — ABNORMAL LOW (ref 3.87–5.11)
RDW: 15.9 % — ABNORMAL HIGH (ref 11.5–15.5)
WBC: 12.5 10*3/uL — ABNORMAL HIGH (ref 4.0–10.5)
nRBC: 0 % (ref 0.0–0.2)

## 2020-05-12 LAB — GLUCOSE, CAPILLARY
Glucose-Capillary: 139 mg/dL — ABNORMAL HIGH (ref 70–99)
Glucose-Capillary: 155 mg/dL — ABNORMAL HIGH (ref 70–99)
Glucose-Capillary: 175 mg/dL — ABNORMAL HIGH (ref 70–99)
Glucose-Capillary: 178 mg/dL — ABNORMAL HIGH (ref 70–99)
Glucose-Capillary: 231 mg/dL — ABNORMAL HIGH (ref 70–99)
Glucose-Capillary: 87 mg/dL (ref 70–99)

## 2020-05-12 MED ORDER — ALTEPLASE 2 MG IJ SOLR: 2.0000 mg | Freq: Once | INTRAMUSCULAR | Status: DC | PRN

## 2020-05-12 MED ORDER — LIDOCAINE HCL (PF) 1 % IJ SOLN: 5.0000 mL | INTRAMUSCULAR | Status: DC | PRN

## 2020-05-12 MED ORDER — SODIUM CHLORIDE 0.9 % IV SOLN: 100.0000 mL | INTRAVENOUS | Status: DC | PRN

## 2020-05-12 MED ORDER — LIDOCAINE-PRILOCAINE 2.5-2.5 % EX CREA: 1.0000 "application " | TOPICAL_CREAM | CUTANEOUS | Status: DC | PRN

## 2020-05-12 MED ORDER — HEPARIN SODIUM (PORCINE) 1000 UNIT/ML DIALYSIS: 20.0000 [IU]/kg | INTRAMUSCULAR | Status: DC | PRN

## 2020-05-12 MED ORDER — PENTAFLUOROPROP-TETRAFLUOROETH EX AERO: 1.0000 "application " | INHALATION_SPRAY | CUTANEOUS | Status: DC | PRN

## 2020-05-12 MED ORDER — INSULIN GLARGINE 100 UNIT/ML ~~LOC~~ SOLN
5.0000 [IU] | Freq: Every day | SUBCUTANEOUS | Status: DC
  Administered 2020-05-12: 5 [IU] via SUBCUTANEOUS
  Filled 2020-05-12 (×2): qty 0.05

## 2020-05-12 MED ORDER — HEPARIN SODIUM (PORCINE) 1000 UNIT/ML DIALYSIS: 1000.0000 [IU] | INTRAMUSCULAR | Status: DC | PRN

## 2020-05-12 MED ORDER — HEPARIN SODIUM (PORCINE) 1000 UNIT/ML IJ SOLN
INTRAMUSCULAR | Status: AC
  Filled 2020-05-12: qty 5

## 2020-05-12 NOTE — Procedures (Signed)
Patient was seen on dialysis and the procedure was supervised.  BFR 400  Via AVF BP is  101/67 .   Patient appears to be tolerating treatment well.  Sheryle Vice Tanna Furry 05/12/2020

## 2020-05-12 NOTE — Progress Notes (Signed)
HD#88 Subjective:  Overnight Events: no events overnight.   Patient is stable. Eating well. She has occasional mucus plugs, but otherwise, not dyspneic.    Objective:  Vital signs in last 24 hours: Vitals:   05/11/20 2044 05/11/20 2310 05/12/20 0314 05/12/20 0651  BP: 137/73   132/70  Pulse: (!) 120 (!) 108 (!) 103 (!) 107  Resp: 19 (!) 26 14   Temp: 98.8 F (37.1 C)     TempSrc: Oral     SpO2: 97% 97% 96% 100%  Weight:      Height:       Supplemental O2: trach collar SpO2: 100 % O2 Flow Rate (L/min): 5 L/min FiO2 (%): 28 %   Physical Exam:  Physical Exam Constitutional:      Appearance: Normal appearance.  HENT:     Head: Normocephalic and atraumatic.  Eyes:     Extraocular Movements: Extraocular movements intact.  Cardiovascular:     Rate and Rhythm: Normal rate.     Pulses: Normal pulses.     Heart sounds: Normal heart sounds.  Pulmonary:     Effort: Pulmonary effort is normal.     Breath sounds: Normal breath sounds.  Abdominal:     General: Bowel sounds are normal.     Palpations: Abdomen is soft.     Tenderness: There is no abdominal tenderness.  Musculoskeletal:        General: Normal range of motion.     Cervical back: Normal range of motion.     Right lower leg: No edema.     Left lower leg: No edema.  Skin:    General: Skin is warm and dry.  Neurological:     Mental Status: She is alert and oriented to person, place, and time. Mental status is at baseline.  Psychiatric:        Mood and Affect: Mood normal.     Filed Weights   05/02/20 0459  Weight: 104.9 kg     Intake/Output Summary (Last 24 hours) at 05/12/2020 0702 Last data filed at 05/12/2020 0316 Gross per 24 hour  Intake 1111.81 ml  Output 600 ml  Net 511.81 ml   Net IO Since Admission: 20,515.43 mL [05/12/20 0702]  Pertinent Labs: CBC Latest Ref Rng & Units 05/10/2020 05/08/2020 05/07/2020  WBC 4.0 - 10.5 K/uL 9.5 9.8 8.0  Hemoglobin 12.0 - 15.0 g/dL 7.2(L) 8.1(L) 7.4(L)   Hematocrit 36.0 - 46.0 % 23.6(L) 25.2(L) 24.3(L)  Platelets 150 - 400 K/uL 489(H) 577(H) 533(H)    CMP Latest Ref Rng & Units 05/12/2020 05/11/2020 05/10/2020  Glucose 70 - 99 mg/dL 190(H) 153(H) 130(H)  BUN 6 - 20 mg/dL 54(H) 31(H) 64(H)  Creatinine 0.44 - 1.00 mg/dL 6.18(H) 3.68(H) 5.27(H)  Sodium 135 - 145 mmol/L 138 138 137  Potassium 3.5 - 5.1 mmol/L 4.7 4.5 4.9  Chloride 98 - 111 mmol/L 100 99 98  CO2 22 - 32 mmol/L 24 26 25   Calcium 8.9 - 10.3 mg/dL 10.3 9.5 10.1  Total Protein 6.5 - 8.1 g/dL - - -  Total Bilirubin 0.3 - 1.2 mg/dL - - -  Alkaline Phos 38 - 126 U/L - - -  AST 15 - 41 U/L - - -  ALT 0 - 44 U/L - - -    Imaging: No results found.  Assessment/Plan:   Principal Problem:   COVID-19 Active Problems:   End stage renal disease on dialysis ([place])   Diabetes mellitus type 2 in obese (  HCC)   OSA (obstructive sleep apnea)   HCAP (healthcare-associated pneumonia)   Acute respiratory failure with hypoxemia (HCC)   Encephalopathy acute   ARDS (adult respiratory distress syndrome) (HCC)   Pressure injury of skin   Aspiration into airway   Status post tracheostomy ([place])   Fever   Septic shock (HCC)   Cellulitis of buttock   Palliative care encounter   Sacral wound   Patient Summary: Wanda Ramirez a 49 y.o.with a pertinent PMH of OSA, morbid obesity, hypothyroidism, hypertension, GERD, ESRD, diabetes, asthma, who presented withshortness of breathand admitted for Covid pneumoniarequiring tracheostomy andcomplicated by prolonged hospital stay including subsequent MSSApneumoniaand necrotizing soft tissue infectionof her sacrum.  Chronic hypoxic respiratory failure s/p tracheostomy secondary to IHKVQ-25 pneumonia complicated by MSSA pneumonia: -Continue 5 L trach collar as needed -Periodic suctioning for mucous plugs -Appreciate pulmonary managing patient's tracheostomy. Will defer to pulmonary for decannulation in the future..  Necrotizing  soft tissue infection of sacral wound status post debridement (11/2 and 11/5) -On day10 of meropenem for surgical wound infection,will finish 10-day course. - Continues to have drainage of serosanguinous fluid from wound vac. Continue wound vac per surgery. -Appreciate surgery and wound care's assistance -Continue pain management with oxycodone 10 mg every 6 hours and fentanyl every 2 hours as needed for severe pain/breakthrough/dressing changes  ESRD Patient has tunneled cath in place for HD -Continue HD per nephrology -Continue midodrine 10 mg 3 times daily with HD -Continue Aranespper nephrology   Depression/Anxiety: -Continue Seroquel nightly and Klonopin daily  Type 2 diabetes: Patient's blood sugars have improved substantially since decreasing her Lantus to 3 units. Current blood sugars are in the low to mid 100s. -3 units Lantus with sensitive sliding scale  High risk for malnutrition: Deconditioning  - Continue encouraging PO intake.  - CR consulted for inpatient rehab.   Dispo: Anticipated discharge toCIRpending provement.  Lawerance Cruel, D.O.  Internal Medicine Resident, PGY-2 [facility]  Pager: [phone] 7:02 AM, 05/12/2020   Please contact the on call pager after 5 pm and on weekends at [phone].

## 2020-05-13 LAB — GLUCOSE, CAPILLARY
Glucose-Capillary: 126 mg/dL — ABNORMAL HIGH (ref 70–99)
Glucose-Capillary: 129 mg/dL — ABNORMAL HIGH (ref 70–99)
Glucose-Capillary: 149 mg/dL — ABNORMAL HIGH (ref 70–99)
Glucose-Capillary: 189 mg/dL — ABNORMAL HIGH (ref 70–99)
Glucose-Capillary: 190 mg/dL — ABNORMAL HIGH (ref 70–99)
Glucose-Capillary: 245 mg/dL — ABNORMAL HIGH (ref 70–99)
Glucose-Capillary: 251 mg/dL — ABNORMAL HIGH (ref 70–99)

## 2020-05-13 LAB — RENAL FUNCTION PANEL
Albumin: 2.2 g/dL — ABNORMAL LOW (ref 3.5–5.0)
Anion gap: 13 (ref 5–15)
BUN: 33 mg/dL — ABNORMAL HIGH (ref 6–20)
CO2: 24 mmol/L (ref 22–32)
Calcium: 10.1 mg/dL (ref 8.9–10.3)
Chloride: 98 mmol/L (ref 98–111)
Creatinine, Ser: 3.71 mg/dL — ABNORMAL HIGH (ref 0.44–1.00)
GFR, Estimated: 14 mL/min — ABNORMAL LOW (ref >60)
Glucose, Bld: 124 mg/dL — ABNORMAL HIGH (ref 70–99)
Phosphorus: 4.4 mg/dL (ref 2.5–4.6)
Potassium: 4.4 mmol/L (ref 3.5–5.1)
Sodium: 135 mmol/L (ref 135–145)

## 2020-05-13 MED ORDER — INSULIN GLARGINE 100 UNIT/ML ~~LOC~~ SOLN
3.0000 [IU] | Freq: Every day | SUBCUTANEOUS | Status: DC
  Administered 2020-05-13 – 2020-05-14 (×2): 3 [IU] via SUBCUTANEOUS
  Filled 2020-05-13 (×3): qty 0.03

## 2020-05-13 MED ORDER — INSULIN ASPART 100 UNIT/ML ~~LOC~~ SOLN: 2.0000 [IU] | Freq: Three times a day (TID) | SUBCUTANEOUS | Status: DC | PRN

## 2020-05-13 NOTE — Progress Notes (Signed)
Reinforced wound vac dressing. Cannister filled to 372ml mark.

## 2020-05-13 NOTE — Progress Notes (Signed)
[facility] NEPHROLOGY PROGRESS NOTE  Assessment/ Plan: Pt is a 49 y.o. yo female  w/ ESRD on TTS HD admitted for COVID PNA on 02/13/20. She was intubated. SP CRRT around 9/26- 10/7. Now is on intermittent HD MWF here. She is sp trach. She had MSSA pna. DM2.Then she had large sacral decub requiring I&D.   OP HD:TTS  4h 40mn 450/800 2/2.25 bath 111.5kg Hep 2000 L AVF - darbe 50 ug q week, last 9/7 - calc 1.0 tiw - 9/9 Hb 10.0, tsat 22%  # COVID pna sp trach complicated by MSSA PNA - off vent on trach collar.  #Hypotension - BP's stable on midodrine 10 tid. Monitor BP.  #Stage IV sacral decub:- sp I&D x 2.  On meropenem. wound care, general surgery is following.  #ESRD - S/P CRRT, now HD TTS.   HD yesterday with 3 L UF, plan for next HD on Tuesday.  #Anemia ckd/ critical illness - on darbe 150 weekly, 11/12 Ferritin >4000, iron sat 11. With active infection and ^^ ferritin no IV iron. tx prbc's PRN. Rechecked iron panel-- Ferritin 2777 and %sat 18, hold off on IV iron for now. Transfuse prn.  #MBD ckd -monitor Ca and phos level, started sevelamer. Monitor.   # HD access: LUA AVF clotted here during acute covid. TDC x2 per IR  #Dispo: Considering inpatient rehab.  Otherwise she will need LTAC because of trach and dialysis need.  Subjective: Seen and examined.  Denies nausea vomiting chest pain or shortness of breath.  No new event. Objective Vital signs in last 24 hours: Vitals:   05/12/20 2321 05/13/20 0326 05/13/20 0427 05/13/20 0805  BP: 110/78 119/75  119/73  Pulse: 95 95 91 99  Resp: '19 18 18 18  ' Temp: 98.8 F (37.1 C) 98.2 F (36.8 C)  98.5 F (36.9 C)  TempSrc: Oral Oral  Oral  SpO2: 100% 100% 100% 98%  Weight:      Height:       Weight change:   Intake/Output Summary (Last 24 hours) at 05/13/2020 0920 Last data filed at 05/12/2020 1800 Gross per 24 hour  Intake 823 ml  Output 3000 ml  Net -2177 ml       Labs: Basic Metabolic  Panel: Recent Labs  Lab 05/11/20 0259 05/12/20 0114 05/13/20 0327  NA 138 138 135  K 4.5 4.7 4.4  CL 99 100 98  CO2 '26 24 24  ' GLUCOSE 153* 190* 124*  BUN 31* 54* 33*  CREATININE 3.68* 6.18* 3.71*  CALCIUM 9.5 10.3 10.1  PHOS 4.5 6.1* 4.4   Liver Function Tests: Recent Labs  Lab 05/11/20 0259 05/12/20 0114 05/13/20 0327  ALBUMIN 2.1* 2.1* 2.2*   No results for input(s): LIPASE, AMYLASE in the last 168 hours. No results for input(s): AMMONIA in the last 168 hours. CBC: Recent Labs  Lab 05/07/20 0310 05/08/20 0718 05/10/20 0629 05/12/20 0754  WBC 8.0 9.8 9.5 12.5*  HGB 7.4* 8.1* 7.2* 7.2*  HCT 24.3* 25.2* 23.6* 23.3*  MCV 89.7 88.7 89.1 90.3  PLT 533* 577* 489* 449*   Cardiac Enzymes: No results for input(s): CKTOTAL, CKMB, CKMBINDEX, TROPONINI in the last 168 hours. CBG: Recent Labs  Lab 05/12/20 1639 05/12/20 1912 05/12/20 2323 05/13/20 0322 05/13/20 0803  GLUCAP 178* 231* 87 129* 126*    Iron Studies: No results for input(s): IRON, TIBC, TRANSFERRIN, FERRITIN in the last 72 hours. Studies/Results: No results found.  Medications: Infusions: . sodium chloride 250 mL (04/08/20  2040)    Scheduled Medications: . B-complex with vitamin C  1 tablet Oral Daily  . chlorhexidine gluconate (MEDLINE KIT)  15 mL Mouth Rinse BID  . Chlorhexidine Gluconate Cloth  6 each Topical Daily  . Chlorhexidine Gluconate Cloth  6 each Topical Q0600  . clonazepam  0.25 mg Oral Daily  . collagenase   Topical Daily  . darbepoetin (ARANESP) injection - DIALYSIS  150 mcg Intravenous Q Thu-HD  . feeding supplement (GLUCERNA SHAKE)  237 mL Oral TID WC  . heparin injection (subcutaneous)  5,000 Units Subcutaneous Q8H  . hydrocerin   Topical Daily  . insulin aspart  0-6 Units Subcutaneous Q4H  . insulin glargine  5 Units Subcutaneous Daily  . levothyroxine  125 mcg Oral Q0600  . midodrine  10 mg Oral TID WC  . multivitamin  1 tablet Oral QHS  . nutrition supplement (JUVEN)   1 packet Oral BID BM  . oxyCODONE  10 mg Oral Q6H  . pantoprazole  40 mg Oral Daily  . QUEtiapine  50 mg Oral QHS  . sevelamer carbonate  1.6 g Oral TID WC  . sodium chloride flush  10-40 mL Intracatheter Q12H    have reviewed scheduled and prn medications.  Physical Exam: General: Comfortable, has tracheostomy Heart:RRR, s1s2 nl Lungs: Bibasal rhonchi and some coarse breath sound Abdomen:soft, Non-tender, non-distended Extremities: No edema Dialysis Access: Left IJ tunnel HD catheter  Lucion Dilger Tanna Furry 05/13/2020,9:20 AM  LOS: 89 days  Pager: [phone]

## 2020-05-13 NOTE — Progress Notes (Signed)
HD#89 Subjective:  Overnight Events: No events overnight  Patient continues to make improvements.  She denies any new symptoms today.  Objective:  Vital signs in last 24 hours: Vitals:   05/12/20 2321 05/13/20 0326 05/13/20 0427 05/13/20 0805  BP: 110/78 119/75  119/73  Pulse: 95 95 91 99  Resp: 19 18 18 18   Temp: 98.8 F (37.1 C) 98.2 F (36.8 C)  98.5 F (36.9 C)  TempSrc: Oral Oral  Oral  SpO2: 100% 100% 100% 98%  Weight:      Height:       Supplemental O2: Trach collar SpO2: 98 % O2 Flow Rate (L/min): 5 L/min FiO2 (%): 28 %   Physical Exam:  Physical Exam Constitutional:      Appearance: Normal appearance.  HENT:     Head: Normocephalic and atraumatic.  Eyes:     Extraocular Movements: Extraocular movements intact.  Cardiovascular:     Rate and Rhythm: Normal rate.     Pulses: Normal pulses.     Heart sounds: Normal heart sounds.  Pulmonary:     Effort: Pulmonary effort is normal.     Breath sounds: Normal breath sounds.  Abdominal:     General: Bowel sounds are normal.     Palpations: Abdomen is soft.     Tenderness: There is no abdominal tenderness.  Musculoskeletal:        General: Normal range of motion.     Cervical back: Normal range of motion.     Right lower leg: No edema.     Left lower leg: No edema.  Skin:    General: Skin is warm and dry.     Comments: Wound VAC in place with some serosanguineous to brown fluid collection.  Neurological:     Mental Status: She is alert and oriented to person, place, and time. Mental status is at baseline.  Psychiatric:        Mood and Affect: Mood normal.     Filed Weights   05/12/20 0713 05/12/20 1125  Weight: 103.4 kg 100.4 kg     Intake/Output Summary (Last 24 hours) at 05/13/2020 1113 Last data filed at 05/12/2020 1800 Gross per 24 hour  Intake 823 ml  Output 3000 ml  Net -2177 ml   Net IO Since Admission: 18,338.43 mL [05/13/20 1113]  Pertinent Labs: CBC Latest Ref Rng & Units  05/12/2020 05/10/2020 05/08/2020  WBC 4.0 - 10.5 K/uL 12.5(H) 9.5 9.8  Hemoglobin 12.0 - 15.0 g/dL 7.2(L) 7.2(L) 8.1(L)  Hematocrit 36.0 - 46.0 % 23.3(L) 23.6(L) 25.2(L)  Platelets 150 - 400 K/uL 449(H) 489(H) 577(H)    CMP Latest Ref Rng & Units 05/13/2020 05/12/2020 05/11/2020  Glucose 70 - 99 mg/dL 124(H) 190(H) 153(H)  BUN 6 - 20 mg/dL 33(H) 54(H) 31(H)  Creatinine 0.44 - 1.00 mg/dL 3.71(H) 6.18(H) 3.68(H)  Sodium 135 - 145 mmol/L 135 138 138  Potassium 3.5 - 5.1 mmol/L 4.4 4.7 4.5  Chloride 98 - 111 mmol/L 98 100 99  CO2 22 - 32 mmol/L 24 24 26   Calcium 8.9 - 10.3 mg/dL 10.1 10.3 9.5  Total Protein 6.5 - 8.1 g/dL - - -  Total Bilirubin 0.3 - 1.2 mg/dL - - -  Alkaline Phos 38 - 126 U/L - - -  AST 15 - 41 U/L - - -  ALT 0 - 44 U/L - - -    Imaging: No results found.  Assessment/Plan:   Principal Problem:   COVID-19 Active Problems:  End stage renal disease on dialysis [facility])   Diabetes mellitus type 2 in obese (HCC)   OSA (obstructive sleep apnea)   HCAP (healthcare-associated pneumonia)   Acute respiratory failure with hypoxemia (HCC)   Encephalopathy acute   ARDS (adult respiratory distress syndrome) (HCC)   Pressure injury of skin   Aspiration into airway   Status post tracheostomy ([place])   Fever   Septic shock (HCC)   Cellulitis of buttock   Palliative care encounter   Sacral wound   Patient Summary: Wanda Ramirez a 49 y.o.with a pertinent PMH of OSA, morbid obesity, hypothyroidism, hypertension, GERD, ESRD, diabetes, asthma, who presented withshortness of breathand admitted for Covid pneumoniarequiring tracheostomy andcomplicated by prolonged hospital stay including subsequent MSSApneumoniaand necrotizing soft tissue infectionof her sacrum.  Chronic hypoxic respiratory failure s/p tracheostomy secondary to HXTAV-69 pneumonia complicated by MSSA pneumonia: Patient stable from a respiratory standpoint.  Will defer to pulmonology for timing of  decannulation. 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 -Continue wound VAC and Santyl per surgery's recommendations -Continue pain management with oxycodone 10 mg every 6 hours and fentanyl every 2 hours as needed for severe pain and dressing changes.  ESRD Patient has tunneled cath in place for HD -Continue HD per nephrology -Continue midodrine 10 mg 3 times daily with HD -Continue Aranespper nephrology   Depression/Anxiety: -Continue Seroquel nightly and Klonopin daily  Type 2 diabetes: Patient's blood sugars have significant spikes.  I will keep her on 3 units Lantus daily with 2 units NovoLog as needed for 50% or greater of meal plus sliding scale as needed we will keep a close eye on her blood sugars.   Dispo: Anticipated discharge toCIRpending provement.  Lawerance Cruel, D.O.  Internal Medicine Resident, PGY-2 [facility]  Pager: [phone] 11:13 AM, 05/13/2020   Please contact the on call pager after 5 pm and on weekends at [phone].

## 2020-05-14 LAB — RENAL FUNCTION PANEL
Albumin: 2.1 g/dL — ABNORMAL LOW (ref 3.5–5.0)
Anion gap: 15 (ref 5–15)
BUN: 63 mg/dL — ABNORMAL HIGH (ref 6–20)
CO2: 23 mmol/L (ref 22–32)
Calcium: 10.4 mg/dL — ABNORMAL HIGH (ref 8.9–10.3)
Chloride: 97 mmol/L — ABNORMAL LOW (ref 98–111)
Creatinine, Ser: 5.84 mg/dL — ABNORMAL HIGH (ref 0.44–1.00)
GFR, Estimated: 8 mL/min — ABNORMAL LOW (ref >60)
Glucose, Bld: 208 mg/dL — ABNORMAL HIGH (ref 70–99)
Phosphorus: 5.8 mg/dL — ABNORMAL HIGH (ref 2.5–4.6)
Potassium: 5.1 mmol/L (ref 3.5–5.1)
Sodium: 135 mmol/L (ref 135–145)

## 2020-05-14 LAB — GLUCOSE, CAPILLARY
Glucose-Capillary: 204 mg/dL — ABNORMAL HIGH (ref 70–99)
Glucose-Capillary: 90 mg/dL (ref 70–99)
Glucose-Capillary: 142 mg/dL — ABNORMAL HIGH (ref 70–99)
Glucose-Capillary: 210 mg/dL — ABNORMAL HIGH (ref 70–99)
Glucose-Capillary: 161 mg/dL — ABNORMAL HIGH (ref 70–99)

## 2020-05-14 MED ORDER — BRINZOLAMIDE 1 % OP SUSP
1.0000 [drp] | Freq: Three times a day (TID) | OPHTHALMIC | Status: DC
  Administered 2020-05-14 – 2020-05-15 (×5): 1 [drp] via OPHTHALMIC
  Filled 2020-05-14: qty 10

## 2020-05-14 MED ORDER — BRIMONIDINE TARTRATE 0.2 % OP SOLN
1.0000 [drp] | Freq: Three times a day (TID) | OPHTHALMIC | Status: DC
  Administered 2020-05-14 – 2020-05-15 (×5): 1 [drp] via OPHTHALMIC
  Filled 2020-05-14: qty 5

## 2020-05-14 MED ORDER — LATANOPROST 0.005 % OP SOLN
1.0000 [drp] | Freq: Every morning | OPHTHALMIC | Status: DC
  Administered 2020-05-15: 1 [drp] via OPHTHALMIC
  Filled 2020-05-14: qty 2.5

## 2020-05-14 NOTE — Progress Notes (Signed)
Inpatient Rehabilitation Admissions Coordinator  Discussed case with Dr. Naaman Plummer and Gigi Gin of [facility] leadership. I await bed availability to possibly admit patient to inpt rehab this week. I met with patient at bedside and she is aware and in agreement to plan.  Danne Baxter, RN, MSN Rehab Admissions Coordinator [phone] 05/14/2020 11:27 AM

## 2020-05-14 NOTE — Plan of Care (Signed)
°  Problem: Education: Goal: Knowledge of risk factors and measures for prevention of condition will improve Outcome: Progressing   Problem: Coping: Goal: Psychosocial and spiritual needs will be supported Outcome: Progressing   Problem: Respiratory: Goal: Will maintain a patent airway Outcome: Progressing Goal: Complications related to the disease process, condition or treatment will be avoided or minimized Outcome: Progressing   Problem: Education: Goal: Knowledge of General Education information will improve Description: Including pain rating scale, medication(s)/side effects and non-pharmacologic comfort measures Outcome: Progressing   Problem: Health Behavior/Discharge Planning: Goal: Ability to manage health-related needs will improve Outcome: Progressing   Problem: Clinical Measurements: Goal: Ability to maintain clinical measurements within normal limits will improve Outcome: Progressing Goal: Will remain free from infection Outcome: Progressing Goal: Diagnostic test results will improve Outcome: Progressing Goal: Respiratory complications will improve Outcome: Progressing Goal: Cardiovascular complication will be avoided Outcome: Progressing   Problem: Activity: Goal: Risk for activity intolerance will decrease Outcome: Progressing   Problem: Nutrition: Goal: Adequate nutrition will be maintained Outcome: Progressing   Problem: Coping: Goal: Level of anxiety will decrease Outcome: Progressing   Problem: Elimination: Goal: Will not experience complications related to bowel motility Outcome: Progressing   Problem: Pain Managment: Goal: General experience of comfort will improve Outcome: Progressing   Problem: Safety: Goal: Ability to remain free from injury will improve Outcome: Progressing   Problem: Skin Integrity: Goal: Risk for impaired skin integrity will decrease Outcome: Progressing   Problem: Activity: Goal: Ability to tolerate increased  activity will improve Outcome: Progressing   Problem: Respiratory: Goal: Ability to maintain a clear airway and adequate ventilation will improve Outcome: Progressing   Problem: Role Relationship: Goal: Method of communication will improve Outcome: Progressing   Problem: Education: Goal: Knowledge of disease and its progression will improve Outcome: Progressing Goal: Individualized Educational Video(s) Outcome: Progressing   Problem: Fluid Volume: Goal: Compliance with measures to maintain balanced fluid volume will improve Outcome: Progressing   Problem: Health Behavior/Discharge Planning: Goal: Ability to manage health-related needs will improve Outcome: Progressing   Problem: Nutritional: Goal: Ability to make healthy dietary choices will improve Outcome: Progressing   Problem: Clinical Measurements: Goal: Complications related to the disease process, condition or treatment will be avoided or minimized Outcome: Progressing   Problem: Activity: Goal: Ability to tolerate increased activity will improve Outcome: Progressing   Problem: Respiratory: Goal: Ability to maintain a clear airway and adequate ventilation will improve Outcome: Progressing   Problem: Role Relationship: Goal: Method of communication will improve Outcome: Progressing

## 2020-05-14 NOTE — Progress Notes (Signed)
05/14/2020   I have seen and evaluated the patient for prolonged respiratory failure.  S:  Looks amazing! Weaned to RA Sacral wound vac fallen off, too much output  O: Blood pressure 107/67, pulse (!) 106, temperature 98.1 F (36.7 C), temperature source Oral, resp. rate 17, height 5\' 2"  (1.575 m), weight 100.4 kg, SpO2 98 %.  No acute distress lying in bed Trach in place with PMV Lungs diminished at bases, no accessory muscle use Heart sounds regular, ext warm Ext with minimal edema  A:  Prolonged respiratory failure after COVID pneumonia and HCAP complicated by weakness and relative malnutrition all of which is improving.  P:  - Switch to 4-0 cuffless shiley, cap it, and potential decannulation in AM   05/14/2020 [name] MD

## 2020-05-14 NOTE — Progress Notes (Signed)
Wanda Ramirez was capped at 16:58. Patient is tolerating trach capping well with SpO2 of 98% on room air. Trach change held per CCM due to possibility of de cannulating in the next 1-2 days.

## 2020-05-14 NOTE — Progress Notes (Signed)
HD#90 Subjective:  Overnight Events: no events overnight   She continued to improve.  Her breathing is substantially improved.  She is off oxygen altogether.  She she denies problems with mucous plugs lately.  Her appetite is good denies any abdominal pain.  She is able to sit up and eat her meals and appears to be getting stronger by the day.  Patient has some cloudy drainage from her wound VAC will likely need for to be changed today.  Objective:  Vital signs in last 24 hours: Vitals:   05/14/20 0339 05/14/20 0357 05/14/20 0746 05/14/20 0858  BP:  127/72 107/67   Pulse: 94 100 98 (!) 106  Resp: 13 20 14 17   Temp:  98.1 F (36.7 C) 98.1 F (36.7 C)   TempSrc:  Oral Oral   SpO2: 100% 100% 100% 98%  Weight:      Height:       Supplemental O2: Room Air SpO2: 98 % O2 Flow Rate (L/min): 5 L/min FiO2 (%): (S) 21 % (Taken off ATC & placed on RA by MD.)   Physical Exam:  Physical Exam Constitutional:      Appearance: Normal appearance.  HENT:     Head: Normocephalic and atraumatic.  Eyes:     Extraocular Movements: Extraocular movements intact.  Cardiovascular:     Rate and Rhythm: Normal rate.     Pulses: Normal pulses.     Heart sounds: Normal heart sounds.  Pulmonary:     Effort: Pulmonary effort is normal.     Breath sounds: Normal breath sounds.  Abdominal:     General: Bowel sounds are normal.     Palpations: Abdomen is soft.     Tenderness: There is no abdominal tenderness.  Musculoskeletal:        General: Normal range of motion.     Cervical back: Normal range of motion.     Right lower leg: No edema.     Left lower leg: No edema.     Comments: Pains and strength is improving each day.  She is able to sit up and lay down independently and denies significant pain.  Skin:    General: Skin is warm and dry.     Comments: Wound VAC in place with some cloudy red to brownish liquid in the canister.  Neurological:     Mental Status: She is alert and oriented  to person, place, and time. Mental status is at baseline.  Psychiatric:        Mood and Affect: Mood normal.     Filed Weights   05/12/20 0713 05/12/20 1125  Weight: 103.4 kg 100.4 kg     Intake/Output Summary (Last 24 hours) at 05/14/2020 0919 Last data filed at 05/13/2020 1800 Gross per 24 hour  Intake 0 ml  Output 0 ml  Net 0 ml   Net IO Since Admission: 18,338.43 mL [05/14/20 0919]  Pertinent Labs: CBC Latest Ref Rng & Units 05/12/2020 05/10/2020 05/08/2020  WBC 4.0 - 10.5 K/uL 12.5(H) 9.5 9.8  Hemoglobin 12.0 - 15.0 g/dL 7.2(L) 7.2(L) 8.1(L)  Hematocrit 36.0 - 46.0 % 23.3(L) 23.6(L) 25.2(L)  Platelets 150 - 400 K/uL 449(H) 489(H) 577(H)    CMP Latest Ref Rng & Units 05/14/2020 05/13/2020 05/12/2020  Glucose 70 - 99 mg/dL 208(H) 124(H) 190(H)  BUN 6 - 20 mg/dL 63(H) 33(H) 54(H)  Creatinine 0.44 - 1.00 mg/dL 5.84(H) 3.71(H) 6.18(H)  Sodium 135 - 145 mmol/L 135 135 138  Potassium 3.5 -  5.1 mmol/L 5.1 4.4 4.7  Chloride 98 - 111 mmol/L 97(L) 98 100  CO2 22 - 32 mmol/L 23 24 24   Calcium 8.9 - 10.3 mg/dL 10.4(H) 10.1 10.3  Total Protein 6.5 - 8.1 g/dL - - -  Total Bilirubin 0.3 - 1.2 mg/dL - - -  Alkaline Phos 38 - 126 U/L - - -  AST 15 - 41 U/L - - -  ALT 0 - 44 U/L - - -    Imaging: No results found.  Assessment/Plan:   Principal Problem:   COVID-19 Active Problems:   End stage renal disease on dialysis (HCC)   Diabetes mellitus type 2 in obese (HCC)   OSA (obstructive sleep apnea)   HCAP (healthcare-associated pneumonia)   Acute respiratory failure with hypoxemia (HCC)   Encephalopathy acute   ARDS (adult respiratory distress syndrome) (HCC)   Pressure injury of skin   Aspiration into airway   Status post tracheostomy ([place])   Fever   Septic shock (HCC)   Cellulitis of buttock   Palliative care encounter   Sacral wound   Patient Summary: Wanda Stakes Alstonis a 49 y.o.with a pertinent PMH of OSA, morbid obesity, hypothyroidism, hypertension, GERD,  ESRD, diabetes, asthma, who presented withshortness of breathand admitted for Covid pneumoniarequiring tracheostomy andcomplicated by prolonged hospital stay including subsequent MSSApneumoniaand necrotizing soft tissue infectionof her sacrum.  Chronic hypoxic respiratory failure s/p tracheostomy secondary to ENIDP-82 pneumonia complicated by MSSA pneumonia: Patient is stable on room air.  She is ready for decannulation pending pulmonology's recommendations. 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  ESRD Patient has tunneled cath in place for HD -Continue HD per nephrology -Continue midodrine 10 mg 3 times daily with HD -Continue Aranespper nephrology   Depression/Anxiety: -Continue Seroquel nightly and Klonopin daily  Type 2 diabetes: -Continue current diabetic regimen.   Dispo: Anticipated discharge toCIRpending bed availability.  Lawerance Cruel, D.O.  Internal Medicine Resident, PGY-2 [facility]  Pager: [phone] 9:19 AM, 05/14/2020   Please contact the on call pager after 5 pm and on weekends at [phone].

## 2020-05-14 NOTE — Consult Note (Signed)
[place] Nurse Consult Note: Patient receiving care in [facility] 2W35. B. Meuth, PA, at bedside for wound eval and treatment decision making. It was jointly decided to discontinue the [facility] for now, for me to place Dakin's 1/4% moistened gauze into the wound, and begin bid saline moistened gauze dressings this evening.  These orders have been entered.  Re-evaluation for possible MWF VAC resumption to be made 12/20. Monitor the wound area(s) for worsening of condition such as: Signs/symptoms of infection,  Increase in size,  Development of or worsening of odor, Development of pain, or increased pain at the affected locations.  Notify the medical team if any of these develop. [name], RN, MSN, CWOCN, CNS-BC, pager [phone]

## 2020-05-14 NOTE — Progress Notes (Signed)
Physical Therapy Treatment Patient Details Name: Wanda Ramirez MRN: [id number] DOB: [birth year] Today's Date: 05/14/2020    History of Present Illness 49 yo admitted 9/13 with Covid PNA, intubated 9/24-10/6, reintubated 10/8, CRRT 9/26-10/7, trach and bronch 10/18. Pt returned to vent 10/30-11/7. Pt with significant sacral wound to bone s/p I&D x 2. Pt with hemorrhage from wound 11/14, hemorrhagic shock 11/15.Wound VAc replaced 12/8-12/13. PMhx: ESRD TTS, obesity, HTN, GERD, hypothyroidism, DM, asthma    PT Comments    Pt stated that she sat up in chair after last PT session for about 2 hours. Very motivated to stand up with a walker today and wanting to walk despite 10/10 pain in the buttock. Performed gait with RW and several sitting rest breaks secondary to decreased ambulatory endurance. Left in chair at end of session and pt stated she would attempt to try to sit for another 2 hours. Educated on reclining legs up and down about every 20 minutes for pressure relief in order to try and attempt sitting in the chair for longer periods of time.  Vitals during gait: HR 115, SpO2 97% RA  Follow Up Recommendations  CIR     Equipment Recommendations  Rolling walker with 5" wheels;3in1 (PT);Hospital bed;Wheelchair (measurements PT);Wheelchair cushion (measurements PT);Other (comment)    Recommendations for Other Services       Precautions / Restrictions Precautions Precautions: Fall Precaution Comments: trach, sacral wound Restrictions Weight Bearing Restrictions: No    Mobility  Bed Mobility Overal bed mobility: Needs Assistance Bed Mobility: Supine to Sit;Rolling Rolling: Supervision   Supine to sit: Supervision     General bed mobility comments: supervision for safety and usage of rail with rolling to L and supine to sit  Transfers Overall transfer level: Needs assistance Equipment used: Rolling walker (2 wheeled) Transfers: Sit to/from Stand           General  transfer comment: 5x sit to stand for ambulation rest break with min guard each time  Ambulation/Gait Ambulation/Gait assistance: Min guard Gait Distance (Feet): 10 Feet Assistive device: Rolling walker (2 wheeled) Gait Pattern/deviations: Trunk flexed;Shuffle;Step-to pattern;Decreased stride length   Gait velocity interpretation: <1.31 ft/sec, indicative of household ambulator General Gait Details: 4', 4', 8', 10',4' gait trials with seated rest between each trial and close chair follow   Stairs             Wheelchair Mobility    Modified Rankin (Stroke Patients Only)       Balance Overall balance assessment: Needs assistance Sitting-balance support: Feet supported;Bilateral upper extremity supported   Sitting balance - Comments: reliant on bil UE support for pressure relief from sacral wound   Standing balance support: Bilateral upper extremity supported Standing balance-Leahy Scale: Poor Standing balance comment: reliant on bil UE support on RW to maintain static and dynamic standing                            Cognition Arousal/Alertness: Awake/alert Behavior During Therapy: WFL for tasks assessed/performed Overall Cognitive Status: Within Functional Limits for tasks assessed                                        Exercises General Exercises - Lower Extremity Long Arc Quad: AROM;Both;10 reps;Seated Hip Flexion/Marching: AROM;Both;10 reps;Seated    General Comments General comments (skin integrity, edema, etc.): Pt on room air, speaking  valve on throughout      Pertinent Vitals/Pain Pain Assessment: 0-10 Pain Score: 10-Worst pain ever Pain Location: L buttock during gait and sitting Pain Descriptors / Indicators: Grimacing;Discomfort Pain Intervention(s): Limited activity within patient's tolerance;Monitored during session;Repositioned    Home Living                      Prior Function            PT Goals  (current goals can now be found in the care plan section) Acute Rehab PT Goals Patient Stated Goal: go to rehab PT Goal Formulation: With patient Potential to Achieve Goals: Good Progress towards PT goals: Progressing toward goals    Frequency    Min 3X/week      PT Plan Current plan remains appropriate    Co-evaluation              AM-PAC PT "6 Clicks" Mobility   Outcome Measure  Help needed turning from your back to your side while in a flat bed without using bedrails?: A Little Help needed moving from lying on your back to sitting on the side of a flat bed without using bedrails?: A Little Help needed moving to and from a bed to a chair (including a wheelchair)?: A Little Help needed standing up from a chair using your arms (e.g., wheelchair or bedside chair)?: A Little Help needed to walk in hospital room?: A Little Help needed climbing 3-5 steps with a railing? : Total 6 Click Score: 16    End of Session Equipment Utilized During Treatment: Gait belt Activity Tolerance: Patient tolerated treatment well Patient left: in chair;with call bell/phone within reach;with chair alarm set Nurse Communication: Mobility status PT Visit Diagnosis: Muscle weakness (generalized) (M62.81);Difficulty in walking, not elsewhere classified (R26.2);Pain Pain - Right/Left: Left Pain - part of body: Hip     Time: 3646-8032 PT Time Calculation (min) (ACUTE ONLY): 27 min  Charges:  $Gait Training: 8-22 mins $Therapeutic Activity: 8-22 mins                     Beckett Ridge, SPT [id number]   Anyela Napierkowski 05/14/2020, 12:43 PM

## 2020-05-14 NOTE — Progress Notes (Signed)
[facility] Progress Note  41 Days Post-Op  Subjective: CC-  Vac not maintaining seal but no alarm was going off to indicate this. When sponges were removed patient had significant amount of drainage in the wound. May be going to CIR this week.  Objective: Vital signs in last 24 hours: Temp:  [98 F (36.7 C)-98.3 F (36.8 C)] 98.1 F (36.7 C) (12/13 0746) Pulse Rate:  [91-106] 106 (12/13 0858) Resp:  [13-20] 17 (12/13 0858) BP: (99-139)/(60-90) 107/67 (12/13 0746) SpO2:  [98 %-100 %] 98 % (12/13 0858) FiO2 (%):  [21 %-28 %] 21 % (12/13 0858) Last BM Date: 05/01/20  Intake/Output from previous day: No intake/output data recorded. Intake/Output this shift: No intake/output data recorded.  PE: Gen:  Alert, NAD, pleasant Pulm:  rate and effort normal Buttock wound: wound is overall healthy with > 90% granulation tissue at the base. Minimal fibrinous exudate around edge. Tunneling extends down left thigh ~9cm. Trace thin purulent drainage in the dependent, medial aspect of the wound     Lab Results:  Recent Labs    05/12/20 0754  WBC 12.5*  HGB 7.2*  HCT 23.3*  PLT 449*   BMET Recent Labs    05/13/20 0327 05/14/20 0346  NA 135 135  K 4.4 5.1  CL 98 97*  CO2 24 23  GLUCOSE 124* 208*  BUN 33* 63*  CREATININE 3.71* 5.84*  CALCIUM 10.1 10.4*   PT/INR No results for input(s): LABPROT, INR in the last 72 hours. CMP     Component Value Date/Time   NA 135 05/14/2020 0346   K 5.1 05/14/2020 0346   CL 97 (L) 05/14/2020 0346   CO2 23 05/14/2020 0346   GLUCOSE 208 (H) 05/14/2020 0346   BUN 63 (H) 05/14/2020 0346   CREATININE 5.84 (H) 05/14/2020 0346   CALCIUM 10.4 (H) 05/14/2020 0346   PROT 5.9 (L) 04/13/2020 0500   ALBUMIN 2.1 (L) 05/14/2020 0346   AST 25 04/13/2020 0500   ALT 25 04/13/2020 0500   ALKPHOS 316 (H) 04/13/2020 0500   BILITOT 0.8 04/13/2020 0500   GFRNONAA 8 (L) 05/14/2020 0346   GFRAA 33 (L) 03/05/2020 1703   Lipase  No  results found for: LIPASE     Studies/Results: No results found.  Anti-infectives: Anti-infectives (From admission, onward)   Start     Dose/Rate Route Frequency Ordered Stop   05/03/20 2000  ampicillin (OMNIPEN) 2 g in sodium chloride 0.9 % 100 mL IVPB  Status:  Discontinued        2 g 300 mL/hr over 20 Minutes Intravenous Every 24 hours 05/03/20 1610 05/03/20 1629   05/03/20 1800  meropenem (MERREM) 500 mg in sodium chloride 0.9 % 100 mL IVPB        500 mg 200 mL/hr over 30 Minutes Intravenous Every 24 hours 05/03/20 1610 05/13/20 0934   05/03/20 1800  ampicillin (OMNIPEN) 2 g in sodium chloride 0.9 % 100 mL IVPB  Status:  Discontinued        2 g 300 mL/hr over 20 Minutes Intravenous Every 24 hours 05/03/20 1629 05/04/20 0831   04/12/20 1800  meropenem (MERREM) 500 mg in sodium chloride 0.9 % 100 mL IVPB  Status:  Discontinued        500 mg 200 mL/hr over 30 Minutes Intravenous Every 24 hours 04/12/20 1746 04/14/20 1426   04/06/20 1200  vancomycin (VANCOCIN) IVPB 1000 mg/200 mL premix        1,000 mg 200  mL/hr over 60 Minutes Intravenous Every Fri (Hemodialysis) 04/05/20 1529 04/06/20 1300   04/04/20 1200  vancomycin (VANCOCIN) IVPB 1000 mg/200 mL premix        1,000 mg 200 mL/hr over 60 Minutes Intravenous Every M-W-F (Hemodialysis) 04/04/20 1014 04/04/20 1423   04/03/20 0000  piperacillin-tazobactam (ZOSYN) IVPB 2.25 g  Status:  Discontinued        2.25 g 100 mL/hr over 30 Minutes Intravenous Every 8 hours 04/02/20 2253 04/09/20 1559   04/02/20 2345  vancomycin (VANCOCIN) IVPB 1000 mg/200 mL premix        1,000 mg 200 mL/hr over 60 Minutes Intravenous  Once 04/02/20 2251 04/03/20 0236   04/02/20 2251  vancomycin variable dose per unstable renal function (pharmacist dosing)  Status:  Discontinued         Does not apply See admin instructions 04/02/20 2251 04/07/20 1037   04/02/20 1200  ceFAZolin (ANCEF) IVPB 1 g/50 mL premix  Status:  Discontinued        1 g 100 mL/hr over  30 Minutes Intravenous Every 24 hours 04/02/20 1030 04/02/20 2251   04/01/20 1300  vancomycin (VANCOCIN) 2,000 mg in sodium chloride 0.9 % 500 mL IVPB  Status:  Discontinued        2,000 mg 250 mL/hr over 120 Minutes Intravenous  Once 04/01/20 1203 04/01/20 1203   04/01/20 1300  vancomycin (VANCOREADY) IVPB 2000 mg/400 mL        2,000 mg 200 mL/hr over 120 Minutes Intravenous Every 12 hours 04/01/20 1205 04/01/20 1715   04/01/20 1300  vancomycin (VANCOCIN) 2,000 mg in sodium chloride 0.9 % 500 mL IVPB  Status:  Discontinued        2,000 mg 250 mL/hr over 120 Minutes Intravenous  Once 04/01/20 1207 04/01/20 1208   04/01/20 1245  vancomycin (VANCOCIN) 2,000 mg in sodium chloride 0.9 % 500 mL IVPB  Status:  Discontinued        2,000 mg 250 mL/hr over 120 Minutes Intravenous  Once 04/01/20 1154 04/01/20 1206   04/01/20 1154  vancomycin variable dose per unstable renal function (pharmacist dosing)  Status:  Discontinued         Does not apply See admin instructions 04/01/20 1154 04/02/20 1030   03/11/20 1200  cefTRIAXone (ROCEPHIN) 2 g in sodium chloride 0.9 % 100 mL IVPB        2 g 200 mL/hr over 30 Minutes Intravenous Every 24 hours 03/11/20 1107 03/17/20 1307   03/10/20 1200  piperacillin-tazobactam (ZOSYN) IVPB 3.375 g  Status:  Discontinued        3.375 g 12.5 mL/hr over 240 Minutes Intravenous Every 12 hours 03/10/20 1117 03/10/20 1245   03/10/20 1130  vancomycin (VANCOCIN) IVPB 1000 mg/200 mL premix        1,000 mg 200 mL/hr over 60 Minutes Intravenous  Once 03/10/20 1117 03/10/20 1258   03/10/20 1000  vancomycin variable dose per unstable renal function (pharmacist dosing)  Status:  Discontinued         Does not apply See admin instructions 03/09/20 1412 03/14/20 0810   03/09/20 2300  piperacillin-tazobactam (ZOSYN) IVPB 2.25 g  Status:  Discontinued        2.25 g 100 mL/hr over 30 Minutes Intravenous Every 8 hours 03/09/20 1412 03/10/20 1117   03/09/20 1415  piperacillin-tazobactam  (ZOSYN) IVPB 3.375 g        3.375 g 100 mL/hr over 30 Minutes Intravenous  Once 03/09/20 1412 03/09/20 1537   03/09/20  1415  vancomycin (VANCOCIN) 2,250 mg in sodium chloride 0.9 % 500 mL IVPB        2,250 mg 250 mL/hr over 120 Minutes Intravenous  Once 03/09/20 1412 03/09/20 1639   02/28/20 2200  ceFEPIme (MAXIPIME) 1 g in sodium chloride 0.9 % 100 mL IVPB        1 g 200 mL/hr over 30 Minutes Intravenous Every 12 hours 02/28/20 1305 02/29/20 2220   02/28/20 0907  ceFEPIme (MAXIPIME) 1 g in sodium chloride 0.9 % 100 mL IVPB  Status:  Discontinued        1 g 200 mL/hr over 30 Minutes Intravenous Every 24 hours 02/27/20 0939 02/28/20 1305   02/26/20 2200  ceFEPIme (MAXIPIME) 2 g in sodium chloride 0.9 % 100 mL IVPB  Status:  Discontinued        2 g 200 mL/hr over 30 Minutes Intravenous Every 12 hours 02/26/20 1519 02/27/20 0939   02/25/20 1200  ceFEPIme (MAXIPIME) 2 g in sodium chloride 0.9 % 100 mL IVPB  Status:  Discontinued        2 g 200 mL/hr over 30 Minutes Intravenous Every T-Th-Sa (Hemodialysis) 02/25/20 0955 02/26/20 1519   02/24/20 1904  azithromycin (ZITHROMAX) 500 mg in sodium chloride 0.9 % 250 mL IVPB  Status:  Discontinued        500 mg 250 mL/hr over 60 Minutes Intravenous Every 24 hours 02/24/20 1904 02/26/20 0815   02/24/20 1900  azithromycin (ZITHROMAX) 250 mg in dextrose 5 % 125 mL IVPB  Status:  Discontinued        250 mg 125 mL/hr over 60 Minutes Intravenous Every 24 hours 02/24/20 1845 02/24/20 1904   02/24/20 1800  ceFEPIme (MAXIPIME) 2 g in sodium chloride 0.9 % 100 mL IVPB  Status:  Discontinued        2 g 200 mL/hr over 30 Minutes Intravenous Every Fri (Hemodialysis) 02/24/20 1326 02/25/20 0942   02/22/20 1745  ceFAZolin (ANCEF) IVPB 2g/100 mL premix        2 g 200 mL/hr over 30 Minutes Intravenous  Once 02/22/20 1744 02/22/20 1931   02/22/20 0600  ceFAZolin (ANCEF) IVPB 2g/100 mL premix        2 g 200 mL/hr over 30 Minutes Intravenous To Radiology 02/21/20  1658 02/22/20 1900   02/22/20 0000  ceFAZolin (ANCEF) IVPB 1 g/50 mL premix  Status:  Discontinued        1 g 100 mL/hr over 30 Minutes Intravenous To Radiology 02/21/20 1645 02/21/20 1658   02/21/20 1800  azithromycin (ZITHROMAX) tablet 250 mg  Status:  Discontinued       "Followed by" Linked Group Details   250 mg Oral Daily-1800 02/20/20 1658 02/24/20 1852   02/21/20 1800  ceFEPIme (MAXIPIME) 2 g in sodium chloride 0.9 % 100 mL IVPB  Status:  Discontinued        2 g 200 mL/hr over 30 Minutes Intravenous Every T-Th-Sa (1800) 02/20/20 1737 02/24/20 1326   02/20/20 1800  azithromycin (ZITHROMAX) tablet 500 mg       "Followed by" Linked Group Details   500 mg Oral Daily 02/20/20 1658 02/20/20 1731   02/20/20 1745  ceFEPIme (MAXIPIME) 1 g in sodium chloride 0.9 % 100 mL IVPB        1 g 200 mL/hr over 30 Minutes Intravenous STAT 02/20/20 1737 02/21/20 0747   02/19/20 1145  remdesivir 100 mg in sodium chloride 0.9 % 100 mL IVPB  100 mg 200 mL/hr over 30 Minutes Intravenous Daily 02/19/20 1131 02/19/20 1443   02/15/20 1000  remdesivir 100 mg in sodium chloride 0.9 % 100 mL IVPB  Status:  Discontinued       "Followed by" Linked Group Details   100 mg 200 mL/hr over 30 Minutes Intravenous Daily 02/14/20 0944 02/14/20 0951   02/15/20 1000  remdesivir 100 mg in sodium chloride 0.9 % 100 mL IVPB        100 mg 200 mL/hr over 30 Minutes Intravenous Daily 02/14/20 0952 02/17/20 1102   02/14/20 1030  remdesivir 100 mg in sodium chloride 0.9 % 100 mL IVPB        100 mg 200 mL/hr over 30 Minutes Intravenous Every 30 min 02/14/20 0952 02/14/20 1129   02/14/20 0945  remdesivir 200 mg in sodium chloride 0.9% 250 mL IVPB  Status:  Discontinued       "Followed by" Linked Group Details   200 mg 580 mL/hr over 30 Minutes Intravenous Once 02/14/20 0944 02/14/20 0951       Assessment/Plan Morbid obesityBMI 42.02 Hypothyroidism HTN GERD ESRDon HD DM Asthma Chronic respiratory failure 2/2  Covid-19 (diagnosed 02/09/20) S/p tracheostomy Code status DNR  Necrotizing soft tissue infection S/pincision and debridement of sacrum, left gluteus11/2 Dr. Bobbye Morton - Afebrile - D/c wound vac as it is not maintaining seal due to drainage. Will apply Dakins to kerlex and pack today, then resume BID wet to dry dressing changes with normal saline.  If drainage resolves in a week or so ok to consider reapplying vac. We will sign off, please call back with any concerns.   LOS: 90 days    [facility] 05/14/2020, 9:16 AM Please see Amion for pager number during day hours 7:00am-4:30pm

## 2020-05-14 NOTE — Progress Notes (Signed)
[facility] NEPHROLOGY PROGRESS NOTE  Assessment/ Plan: Pt is a 49 y.o. yo female  w/ ESRD on TTS HD admitted for COVID PNA on 02/13/20. She was intubated. SP CRRT around 9/26- 10/7. Now is on intermittent HD MWF here. She is sp trach. She had MSSA pna. DM2.Then she had large sacral decub requiring I&D x 2.    Physical Exam: General: Comfortable, has tracheostomy Heart:RRR, s1s2 nl Lungs: Bibasal rhonchi and some coarse breath sound Abdomen:soft, Non-tender, non-distended Extremities: No edema Dialysis Access: Left IJ tunnel HD catheter   OP HD:TTS  4h 7mn 450/800 2/2.25 bath 111.5kg Hep 2000 TDC  - L AVF clotted while here, using TDC now  - darbe 50 ug q week, last 9/7 - calc 1.0 tiw - 9/9 Hb 10.0, tsat 22%  Problems: 1. COVID pna / sp trach / MSSA PNA - off vent on trach collar. Per CCM possible decannulation soon.  2. Hypotension - BP's stable on midodrine 10 tid. Monitor BP. 3. Stage IV sacral decub - sp I&D x 2.  SP meropenem course. Wound care, general surgery is following, wound is improving.  4. ESRD - S/P CRRT, now HD TTS.  HD tomorrow.  5. Anemia ckd/ critical illness - on darbe 150 weekly, 11/12 Ferritin >4000, iron sat 11. With active infection and ^^ ferritin no IV iron. tx prbc's PRN. Rechecked iron panel-- Ferritin 2777 and %sat 18, hold off on IV iron for now. 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 Monitor 7. HD access: LUA AVF clotted here during acute covid. TDC x2 per IR 8. Dispo: Considering inpatient rehab.  RKelly Splinter MD 05/14/2020, 3:36 PM      Subjective: Seen and examined.  Denies nausea vomiting chest pain or shortness of breath.  No new event.   Objective Vital signs in last 24 hours: Vitals:   05/14/20 0746 05/14/20 0858 05/14/20 1205 05/14/20 1208  BP: 107/67  (!) 129/96 (!) 129/96  Pulse: 98 (!) 106 (!) 107 (!) 110  Resp: 14 17 (!) 22 20  Temp: 98.1 F (36.7 C)  (!) 97.4 F  (36.3 C)   TempSrc: Oral  Oral   SpO2: 100% 98% 98% 99%  Weight:      Height:       Weight change:   Intake/Output Summary (Last 24 hours) at 05/14/2020 1532 Last data filed at 05/13/2020 1800 Gross per 24 hour  Intake 0 ml  Output 0 ml  Net 0 ml       Labs: Basic Metabolic Panel: Recent Labs  Lab 05/12/20 0114 05/13/20 0327 05/14/20 0346  NA 138 135 135  K 4.7 4.4 5.1  CL 100 98 97*  CO2 '24 24 23  ' GLUCOSE 190* 124* 208*  BUN 54* 33* 63*  CREATININE 6.18* 3.71* 5.84*  CALCIUM 10.3 10.1 10.4*  PHOS 6.1* 4.4 5.8*   Liver Function Tests: Recent Labs  Lab 05/12/20 0114 05/13/20 0327 05/14/20 0346  ALBUMIN 2.1* 2.2* 2.1*   No results for input(s): LIPASE, AMYLASE in the last 168 hours. No results for input(s): AMMONIA in the last 168 hours. CBC: Recent Labs  Lab 05/08/20 0718 05/10/20 0629 05/12/20 0754  WBC 9.8 9.5 12.5*  HGB 8.1* 7.2* 7.2*  HCT 25.2* 23.6* 23.3*  MCV 88.7 89.1 90.3  PLT 577* 489* 449*   Cardiac Enzymes: No results for input(s): CKTOTAL, CKMB, CKMBINDEX, TROPONINI in the last 168 hours. CBG: Recent Labs  Lab 05/13/20 2051 05/13/20 2335  05/14/20 0355 05/14/20 0737 05/14/20 1203  GLUCAP 189* 149* 204* 90 142*    Iron Studies: No results for input(s): IRON, TIBC, TRANSFERRIN, FERRITIN in the last 72 hours. Studies/Results: No results found.  Medications: Infusions: . sodium chloride 250 mL (04/08/20 2040)    Scheduled Medications: . B-complex with vitamin C  1 tablet Oral Daily  . brimonidine  1 drop Both Eyes TID  . brinzolamide  1 drop Both Eyes TID  . chlorhexidine gluconate (MEDLINE KIT)  15 mL Mouth Rinse BID  . Chlorhexidine Gluconate Cloth  6 each Topical Daily  . Chlorhexidine Gluconate Cloth  6 each Topical Q0600  . clonazepam  0.25 mg Oral Daily  . collagenase   Topical Daily  . darbepoetin (ARANESP) injection - DIALYSIS  150 mcg Intravenous Q Thu-HD  . feeding supplement (GLUCERNA SHAKE)  237 mL Oral TID  WC  . heparin injection (subcutaneous)  5,000 Units Subcutaneous Q8H  . hydrocerin   Topical Daily  . insulin aspart  0-6 Units Subcutaneous Q4H  . insulin glargine  3 Units Subcutaneous Daily  . [START ON 05/15/2020] latanoprost  1 drop Both Eyes q AM  . levothyroxine  125 mcg Oral Q0600  . midodrine  10 mg Oral TID WC  . multivitamin  1 tablet Oral QHS  . nutrition supplement (JUVEN)  1 packet Oral BID BM  . oxyCODONE  10 mg Oral Q6H  . pantoprazole  40 mg Oral Daily  . QUEtiapine  50 mg Oral QHS  . sevelamer carbonate  1.6 g Oral TID WC  . sodium chloride flush  10-40 mL Intracatheter Q12H    have reviewed scheduled and prn medications.

## 2020-05-15 ENCOUNTER — Inpatient Hospital Stay (HOSPITAL_COMMUNITY)
Admission: RE | Admit: 2020-05-15 | Discharge: 2020-05-30 | DRG: 945 | Disposition: A | Discharge status: Home Health | Payer: Medicaid Other | Source: Intra-hospital | Attending: Physical Medicine & Rehabilitation | Admitting: Physical Medicine & Rehabilitation

## 2020-05-15 DIAGNOSIS — Z83438 Family history of other disorder of lipoprotein metabolism and other lipidemia: Secondary | ICD-10-CM

## 2020-05-15 DIAGNOSIS — Z794 Long term (current) use of insulin: Secondary | ICD-10-CM

## 2020-05-15 DIAGNOSIS — E875 Hyperkalemia: Secondary | ICD-10-CM | POA: Diagnosis present

## 2020-05-15 DIAGNOSIS — Z841 Family history of disorders of kidney and ureter: Secondary | ICD-10-CM

## 2020-05-15 DIAGNOSIS — D72820 Lymphocytosis (symptomatic): Secondary | ICD-10-CM

## 2020-05-15 DIAGNOSIS — E1169 Type 2 diabetes mellitus with other specified complication: Secondary | ICD-10-CM | POA: Diagnosis present

## 2020-05-15 DIAGNOSIS — F05 Delirium due to known physiological condition: Secondary | ICD-10-CM | POA: Diagnosis present

## 2020-05-15 DIAGNOSIS — D6489 Other specified anemias: Secondary | ICD-10-CM | POA: Diagnosis present

## 2020-05-15 DIAGNOSIS — G4733 Obstructive sleep apnea (adult) (pediatric): Secondary | ICD-10-CM | POA: Diagnosis present

## 2020-05-15 DIAGNOSIS — Z7982 Long term (current) use of aspirin: Secondary | ICD-10-CM

## 2020-05-15 DIAGNOSIS — T8241XA Breakdown (mechanical) of vascular dialysis catheter, initial encounter: Secondary | ICD-10-CM | POA: Diagnosis not present

## 2020-05-15 DIAGNOSIS — Z9981 Dependence on supplemental oxygen: Secondary | ICD-10-CM

## 2020-05-15 DIAGNOSIS — Z992 Dependence on renal dialysis: Secondary | ICD-10-CM

## 2020-05-15 DIAGNOSIS — M1990 Unspecified osteoarthritis, unspecified site: Secondary | ICD-10-CM | POA: Diagnosis present

## 2020-05-15 DIAGNOSIS — I959 Hypotension, unspecified: Secondary | ICD-10-CM | POA: Diagnosis present

## 2020-05-15 DIAGNOSIS — K219 Gastro-esophageal reflux disease without esophagitis: Secondary | ICD-10-CM | POA: Diagnosis present

## 2020-05-15 DIAGNOSIS — D72829 Elevated white blood cell count, unspecified: Secondary | ICD-10-CM | POA: Diagnosis present

## 2020-05-15 DIAGNOSIS — M898X9 Other specified disorders of bone, unspecified site: Secondary | ICD-10-CM | POA: Diagnosis present

## 2020-05-15 DIAGNOSIS — F419 Anxiety disorder, unspecified: Secondary | ICD-10-CM | POA: Diagnosis present

## 2020-05-15 DIAGNOSIS — L8990 Pressure ulcer of unspecified site, unspecified stage: Secondary | ICD-10-CM | POA: Diagnosis present

## 2020-05-15 DIAGNOSIS — E1142 Type 2 diabetes mellitus with diabetic polyneuropathy: Secondary | ICD-10-CM | POA: Diagnosis present

## 2020-05-15 DIAGNOSIS — E8889 Other specified metabolic disorders: Secondary | ICD-10-CM | POA: Diagnosis present

## 2020-05-15 DIAGNOSIS — G587 Mononeuritis multiplex: Secondary | ICD-10-CM | POA: Diagnosis present

## 2020-05-15 DIAGNOSIS — E1122 Type 2 diabetes mellitus with diabetic chronic kidney disease: Secondary | ICD-10-CM | POA: Diagnosis present

## 2020-05-15 DIAGNOSIS — Z833 Family history of diabetes mellitus: Secondary | ICD-10-CM

## 2020-05-15 DIAGNOSIS — H409 Unspecified glaucoma: Secondary | ICD-10-CM | POA: Diagnosis present

## 2020-05-15 DIAGNOSIS — Z885 Allergy status to narcotic agent status: Secondary | ICD-10-CM

## 2020-05-15 DIAGNOSIS — U071 COVID-19: Secondary | ICD-10-CM | POA: Diagnosis not present

## 2020-05-15 DIAGNOSIS — Z6841 Body Mass Index (BMI) 40.0 and over, adult: Secondary | ICD-10-CM

## 2020-05-15 DIAGNOSIS — E785 Hyperlipidemia, unspecified: Secondary | ICD-10-CM | POA: Diagnosis present

## 2020-05-15 DIAGNOSIS — G8929 Other chronic pain: Secondary | ICD-10-CM | POA: Diagnosis present

## 2020-05-15 DIAGNOSIS — J45909 Unspecified asthma, uncomplicated: Secondary | ICD-10-CM | POA: Diagnosis present

## 2020-05-15 DIAGNOSIS — Z930 Tracheostomy status: Secondary | ICD-10-CM

## 2020-05-15 DIAGNOSIS — L600 Ingrowing nail: Secondary | ICD-10-CM | POA: Diagnosis present

## 2020-05-15 DIAGNOSIS — I120 Hypertensive chronic kidney disease with stage 5 chronic kidney disease or end stage renal disease: Secondary | ICD-10-CM | POA: Diagnosis present

## 2020-05-15 DIAGNOSIS — Z79899 Other long term (current) drug therapy: Secondary | ICD-10-CM

## 2020-05-15 DIAGNOSIS — D631 Anemia in chronic kidney disease: Secondary | ICD-10-CM | POA: Diagnosis present

## 2020-05-15 DIAGNOSIS — J9601 Acute respiratory failure with hypoxia: Secondary | ICD-10-CM

## 2020-05-15 DIAGNOSIS — E039 Hypothyroidism, unspecified: Secondary | ICD-10-CM | POA: Diagnosis present

## 2020-05-15 DIAGNOSIS — J9611 Chronic respiratory failure with hypoxia: Secondary | ICD-10-CM | POA: Diagnosis present

## 2020-05-15 DIAGNOSIS — N186 End stage renal disease: Secondary | ICD-10-CM | POA: Diagnosis present

## 2020-05-15 DIAGNOSIS — L89322 Pressure ulcer of left buttock, stage 2: Secondary | ICD-10-CM | POA: Diagnosis present

## 2020-05-15 DIAGNOSIS — G9341 Metabolic encephalopathy: Secondary | ICD-10-CM | POA: Diagnosis present

## 2020-05-15 DIAGNOSIS — L89154 Pressure ulcer of sacral region, stage 4: Secondary | ICD-10-CM | POA: Diagnosis present

## 2020-05-15 DIAGNOSIS — R5381 Other malaise: Principal | ICD-10-CM | POA: Diagnosis present

## 2020-05-15 DIAGNOSIS — Y712 Prosthetic and other implants, materials and accessory cardiovascular devices associated with adverse incidents: Secondary | ICD-10-CM | POA: Diagnosis not present

## 2020-05-15 DIAGNOSIS — Z87891 Personal history of nicotine dependence: Secondary | ICD-10-CM

## 2020-05-15 DIAGNOSIS — E669 Obesity, unspecified: Secondary | ICD-10-CM | POA: Diagnosis present

## 2020-05-15 DIAGNOSIS — Z8619 Personal history of other infectious and parasitic diseases: Secondary | ICD-10-CM

## 2020-05-15 DIAGNOSIS — S31000A Unspecified open wound of lower back and pelvis without penetration into retroperitoneum, initial encounter: Secondary | ICD-10-CM | POA: Diagnosis present

## 2020-05-15 DIAGNOSIS — Z8616 Personal history of COVID-19: Secondary | ICD-10-CM

## 2020-05-15 LAB — RENAL FUNCTION PANEL
Albumin: 2.2 g/dL — ABNORMAL LOW (ref 3.5–5.0)
Anion gap: 15 (ref 5–15)
BUN: 90 mg/dL — ABNORMAL HIGH (ref 6–20)
CO2: 22 mmol/L (ref 22–32)
Calcium: 10.4 mg/dL — ABNORMAL HIGH (ref 8.9–10.3)
Chloride: 100 mmol/L (ref 98–111)
Creatinine, Ser: 7.41 mg/dL — ABNORMAL HIGH (ref 0.44–1.00)
GFR, Estimated: 6 mL/min — ABNORMAL LOW (ref >60)
Glucose, Bld: 115 mg/dL — ABNORMAL HIGH (ref 70–99)
Phosphorus: 6 mg/dL — ABNORMAL HIGH (ref 2.5–4.6)
Potassium: 5.9 mmol/L — ABNORMAL HIGH (ref 3.5–5.1)
Sodium: 137 mmol/L (ref 135–145)

## 2020-05-15 LAB — CBC
HCT: 23.3 % — ABNORMAL LOW (ref 36.0–46.0)
Hemoglobin: 7.2 g/dL — ABNORMAL LOW (ref 12.0–15.0)
MCH: 27.6 pg (ref 26.0–34.0)
MCHC: 30.9 g/dL (ref 30.0–36.0)
MCV: 89.3 fL (ref 80.0–100.0)
Platelets: 421 10*3/uL — ABNORMAL HIGH (ref 150–400)
RBC: 2.61 MIL/uL — ABNORMAL LOW (ref 3.87–5.11)
RDW: 15.7 % — ABNORMAL HIGH (ref 11.5–15.5)
WBC: 13.6 10*3/uL — ABNORMAL HIGH (ref 4.0–10.5)
nRBC: 0 % (ref 0.0–0.2)

## 2020-05-15 LAB — GLUCOSE, CAPILLARY
Glucose-Capillary: 117 mg/dL — ABNORMAL HIGH (ref 70–99)
Glucose-Capillary: 129 mg/dL — ABNORMAL HIGH (ref 70–99)
Glucose-Capillary: 141 mg/dL — ABNORMAL HIGH (ref 70–99)
Glucose-Capillary: 146 mg/dL — ABNORMAL HIGH (ref 70–99)
Glucose-Capillary: 206 mg/dL — ABNORMAL HIGH (ref 70–99)

## 2020-05-15 MED ORDER — FLEET ENEMA 7-19 GM/118ML RE ENEM
1.0000 | ENEMA | Freq: Once | RECTAL | Status: DC | PRN
Start: 2020-05-15 — End: 2020-05-30

## 2020-05-15 MED ORDER — HYDROCERIN EX CREA: 1.0000 "application " | TOPICAL_CREAM | Freq: Every day | CUTANEOUS | 0 refills | Status: DC

## 2020-05-15 MED ORDER — LEVOTHYROXINE SODIUM 25 MCG PO TABS
125.0000 ug | ORAL_TABLET | Freq: Every day | ORAL | Status: DC
  Administered 2020-05-16 – 2020-05-30 (×15): 125 ug via ORAL
  Filled 2020-05-15 (×15): qty 1

## 2020-05-15 MED ORDER — SILVER NITRATE-POT NITRATE 75-25 % EX MISC
1.0000 "application " | CUTANEOUS | Status: DC | PRN
  Filled 2020-05-15: qty 1

## 2020-05-15 MED ORDER — DARBEPOETIN ALFA 150 MCG/0.3ML IJ SOSY: 150.0000 ug | PREFILLED_SYRINGE | INTRAMUSCULAR | 0 refills | Status: DC

## 2020-05-15 MED ORDER — HEPARIN SODIUM (PORCINE) 1000 UNIT/ML IJ SOLN
INTRAMUSCULAR | Status: AC
  Filled 2020-05-15: qty 5

## 2020-05-15 MED ORDER — LATANOPROST 0.005 % OP SOLN
1.0000 [drp] | Freq: Every morning | OPHTHALMIC | Status: DC
  Administered 2020-05-16 – 2020-05-29 (×14): 1 [drp] via OPHTHALMIC
  Filled 2020-05-15 (×2): qty 2.5

## 2020-05-15 MED ORDER — CHLORHEXIDINE GLUCONATE CLOTH 2 % EX PADS: 6.0000 | MEDICATED_PAD | Freq: Every day | CUTANEOUS | 0 refills | Status: DC

## 2020-05-15 MED ORDER — PANTOPRAZOLE SODIUM 40 MG PO TBEC
40.0000 mg | DELAYED_RELEASE_TABLET | Freq: Every day | ORAL | Status: DC
  Administered 2020-05-16 – 2020-05-30 (×15): 40 mg via ORAL
  Filled 2020-05-15 (×14): qty 1

## 2020-05-15 MED ORDER — TRAZODONE HCL 50 MG PO TABS
25.0000 mg | ORAL_TABLET | Freq: Every evening | ORAL | Status: DC | PRN
  Administered 2020-05-19 – 2020-05-22 (×3): 50 mg via ORAL
  Filled 2020-05-15 (×5): qty 1

## 2020-05-15 MED ORDER — ALBUTEROL SULFATE (2.5 MG/3ML) 0.083% IN NEBU: 2.5000 mg | INHALATION_SOLUTION | RESPIRATORY_TRACT | 12 refills | Status: DC | PRN

## 2020-05-15 MED ORDER — MIDODRINE HCL 5 MG PO TABS
ORAL_TABLET | ORAL | Status: AC
  Administered 2020-05-15: 10 mg via ORAL
  Filled 2020-05-15: qty 2

## 2020-05-15 MED ORDER — GLUCERNA SHAKE PO LIQD: 237.0000 mL | Freq: Three times a day (TID) | ORAL | 0 refills | Status: DC

## 2020-05-15 MED ORDER — HEPARIN SODIUM (PORCINE) 1000 UNIT/ML DIALYSIS: 2000.0000 [IU] | Freq: Once | INTRAMUSCULAR | Status: DC

## 2020-05-15 MED ORDER — INSULIN GLARGINE 100 UNIT/ML ~~LOC~~ SOLN: 3.0000 [IU] | Freq: Every day | SUBCUTANEOUS | 11 refills | Status: DC

## 2020-05-15 MED ORDER — CHLORHEXIDINE GLUCONATE CLOTH 2 % EX PADS: 6.0000 | MEDICATED_PAD | Freq: Every day | CUTANEOUS | Status: DC

## 2020-05-15 MED ORDER — B COMPLEX-C PO TABS: 1.0000 | ORAL_TABLET | Freq: Every day | ORAL | 0 refills | Status: DC

## 2020-05-15 MED ORDER — HEPARIN SODIUM (PORCINE) 5000 UNIT/ML IJ SOLN
5000.0000 [IU] | Freq: Three times a day (TID) | INTRAMUSCULAR | Status: DC
  Administered 2020-05-15 – 2020-05-30 (×39): 5000 [IU] via SUBCUTANEOUS
  Filled 2020-05-15 (×40): qty 1

## 2020-05-15 MED ORDER — ONDANSETRON HCL 4 MG/2ML IJ SOLN: 4.0000 mg | Freq: Four times a day (QID) | INTRAMUSCULAR | 0 refills | Status: DC | PRN

## 2020-05-15 MED ORDER — SILVER NITRATE-POT NITRATE 75-25 % EX MISC: 1.0000 "application " | CUTANEOUS | 0 refills | Status: DC | PRN

## 2020-05-15 MED ORDER — CLONAZEPAM 0.25 MG PO TBDP
0.2500 mg | ORAL_TABLET | Freq: Every day | ORAL | Status: DC
  Administered 2020-05-16 – 2020-05-30 (×15): 0.25 mg via ORAL
  Filled 2020-05-15 (×16): qty 1

## 2020-05-15 MED ORDER — LIP MEDEX EX OINT
TOPICAL_OINTMENT | CUTANEOUS | Status: DC | PRN
  Administered 2020-05-15: 1 via TOPICAL
  Filled 2020-05-15 (×2): qty 7

## 2020-05-15 MED ORDER — BRIMONIDINE TARTRATE 0.2 % OP SOLN: 1.0000 [drp] | Freq: Three times a day (TID) | OPHTHALMIC | 12 refills | Status: DC

## 2020-05-15 MED ORDER — BRIMONIDINE TARTRATE 0.2 % OP SOLN
1.0000 [drp] | Freq: Three times a day (TID) | OPHTHALMIC | Status: DC
  Administered 2020-05-15 – 2020-05-29 (×38): 1 [drp] via OPHTHALMIC
  Filled 2020-05-15 (×2): qty 5

## 2020-05-15 MED ORDER — BRINZOLAMIDE 1 % OP SUSP
1.0000 [drp] | Freq: Three times a day (TID) | OPHTHALMIC | Status: DC
  Administered 2020-05-15 – 2020-05-29 (×38): 1 [drp] via OPHTHALMIC
  Filled 2020-05-15 (×2): qty 10

## 2020-05-15 MED ORDER — COLLAGENASE 250 UNIT/GM EX OINT: TOPICAL_OINTMENT | Freq: Every day | CUTANEOUS | 0 refills | Status: DC

## 2020-05-15 MED ORDER — DARBEPOETIN ALFA 150 MCG/0.3ML IJ SOSY
150.0000 ug | PREFILLED_SYRINGE | INTRAMUSCULAR | Status: DC
  Filled 2020-05-15 (×2): qty 0.3

## 2020-05-15 MED ORDER — PROCHLORPERAZINE EDISYLATE 10 MG/2ML IJ SOLN: 5.0000 mg | Freq: Four times a day (QID) | INTRAMUSCULAR | Status: DC | PRN

## 2020-05-15 MED ORDER — CHLORHEXIDINE GLUCONATE CLOTH 2 % EX PADS
6.0000 | MEDICATED_PAD | Freq: Every day | CUTANEOUS | Status: DC
  Administered 2020-05-16 – 2020-05-30 (×15): 6 via TOPICAL

## 2020-05-15 MED ORDER — B COMPLEX-C PO TABS
1.0000 | ORAL_TABLET | Freq: Every day | ORAL | Status: DC
  Administered 2020-05-16 – 2020-05-30 (×15): 1 via ORAL
  Filled 2020-05-15 (×15): qty 1

## 2020-05-15 MED ORDER — RENA-VITE PO TABS
1.0000 | ORAL_TABLET | Freq: Every day | ORAL | Status: DC
  Administered 2020-05-15 – 2020-05-29 (×15): 1 via ORAL
  Filled 2020-05-15 (×15): qty 1

## 2020-05-15 MED ORDER — BISACODYL 10 MG RE SUPP: 10.0000 mg | Freq: Every day | RECTAL | Status: DC | PRN

## 2020-05-15 MED ORDER — MIDODRINE HCL 5 MG PO TABS
10.0000 mg | ORAL_TABLET | Freq: Three times a day (TID) | ORAL | Status: DC
  Administered 2020-05-15 – 2020-05-30 (×43): 10 mg via ORAL
  Filled 2020-05-15 (×43): qty 2

## 2020-05-15 MED ORDER — POLYETHYLENE GLYCOL 3350 17 G PO PACK: 17.0000 g | PACK | Freq: Every day | ORAL | Status: DC | PRN

## 2020-05-15 MED ORDER — HYDROCERIN EX CREA
TOPICAL_CREAM | Freq: Every day | CUTANEOUS | Status: DC
  Administered 2020-05-15 – 2020-05-17 (×3): 1 via TOPICAL
  Filled 2020-05-15: qty 113

## 2020-05-15 MED ORDER — PROCHLORPERAZINE MALEATE 5 MG PO TABS: 5.0000 mg | ORAL_TABLET | Freq: Four times a day (QID) | ORAL | Status: DC | PRN

## 2020-05-15 MED ORDER — ONDANSETRON HCL 4 MG/2ML IJ SOLN: 4.0000 mg | Freq: Four times a day (QID) | INTRAMUSCULAR | Status: DC | PRN

## 2020-05-15 MED ORDER — INSULIN ASPART 100 UNIT/ML ~~LOC~~ SOLN: 2.0000 [IU] | Freq: Three times a day (TID) | SUBCUTANEOUS | 11 refills | Status: DC | PRN

## 2020-05-15 MED ORDER — INSULIN GLARGINE 100 UNIT/ML ~~LOC~~ SOLN
3.0000 [IU] | Freq: Every day | SUBCUTANEOUS | Status: DC
  Administered 2020-05-15 – 2020-05-17 (×3): 3 [IU] via SUBCUTANEOUS
  Filled 2020-05-15 (×4): qty 0.03

## 2020-05-15 MED ORDER — PANTOPRAZOLE SODIUM 40 MG PO TBEC: 40.0000 mg | DELAYED_RELEASE_TABLET | Freq: Every day | ORAL | 0 refills | Status: DC

## 2020-05-15 MED ORDER — QUETIAPINE FUMARATE 50 MG PO TABS: 50.0000 mg | ORAL_TABLET | Freq: Every day | ORAL | 0 refills | Status: DC

## 2020-05-15 MED ORDER — RENA-VITE PO TABS: 1.0000 | ORAL_TABLET | Freq: Every day | ORAL | 0 refills | Status: DC

## 2020-05-15 MED ORDER — CLONAZEPAM 0.25 MG PO TBDP: 0.2500 mg | ORAL_TABLET | Freq: Every day | ORAL | 0 refills | Status: DC

## 2020-05-15 MED ORDER — PROCHLORPERAZINE 25 MG RE SUPP: 12.5000 mg | Freq: Four times a day (QID) | RECTAL | Status: DC | PRN

## 2020-05-15 MED ORDER — ACETAMINOPHEN 160 MG/5ML PO SOLN: 650.0000 mg | Freq: Four times a day (QID) | ORAL | 0 refills | Status: DC | PRN

## 2020-05-15 MED ORDER — ACETAMINOPHEN 325 MG PO TABS
325.0000 mg | ORAL_TABLET | ORAL | Status: DC | PRN
  Administered 2020-05-22 – 2020-05-27 (×5): 650 mg via ORAL
  Filled 2020-05-15 (×5): qty 2

## 2020-05-15 MED ORDER — ALUM & MAG HYDROXIDE-SIMETH 200-200-20 MG/5ML PO SUSP
30.0000 mL | ORAL | Status: DC | PRN
Start: 2020-05-15 — End: 2020-05-30

## 2020-05-15 MED ORDER — INSULIN ASPART 100 UNIT/ML ~~LOC~~ SOLN: 0.0000 [IU] | SUBCUTANEOUS | 11 refills | Status: DC

## 2020-05-15 MED ORDER — JUVEN PO PACK: 1.0000 | PACK | Freq: Two times a day (BID) | ORAL | 0 refills | Status: DC

## 2020-05-15 MED ORDER — COLLAGENASE 250 UNIT/GM EX OINT
TOPICAL_OINTMENT | Freq: Every day | CUTANEOUS | Status: DC
  Filled 2020-05-15: qty 30

## 2020-05-15 MED ORDER — QUETIAPINE FUMARATE 50 MG PO TABS
50.0000 mg | ORAL_TABLET | Freq: Every day | ORAL | Status: DC
  Administered 2020-05-15 – 2020-05-29 (×15): 50 mg via ORAL
  Filled 2020-05-15 (×15): qty 1

## 2020-05-15 MED ORDER — LATANOPROST 0.005 % OP SOLN: 1.0000 [drp] | Freq: Every morning | OPHTHALMIC | 12 refills | Status: DC

## 2020-05-15 MED ORDER — LIP MEDEX EX OINT: TOPICAL_OINTMENT | CUTANEOUS | 0 refills | Status: AC | PRN

## 2020-05-15 MED ORDER — ALBUTEROL SULFATE (2.5 MG/3ML) 0.083% IN NEBU: 2.5000 mg | INHALATION_SOLUTION | RESPIRATORY_TRACT | Status: DC | PRN

## 2020-05-15 MED ORDER — CAMPHOR-MENTHOL 0.5-0.5 % EX LOTN
TOPICAL_LOTION | CUTANEOUS | Status: DC | PRN
  Filled 2020-05-15: qty 222

## 2020-05-15 MED ORDER — INSULIN ASPART 100 UNIT/ML ~~LOC~~ SOLN: 2.0000 [IU] | Freq: Three times a day (TID) | SUBCUTANEOUS | Status: DC | PRN

## 2020-05-15 MED ORDER — SEVELAMER CARBONATE 0.8 G PO PACK
1.6000 g | PACK | Freq: Three times a day (TID) | ORAL | Status: DC
  Administered 2020-05-15 – 2020-05-16 (×2): 1.6 g via ORAL
  Filled 2020-05-15 (×3): qty 2

## 2020-05-15 MED ORDER — INSULIN ASPART 100 UNIT/ML ~~LOC~~ SOLN
0.0000 [IU] | Freq: Three times a day (TID) | SUBCUTANEOUS | Status: DC
  Administered 2020-05-15: 22:00:00 2 [IU] via SUBCUTANEOUS
  Administered 2020-05-16 – 2020-05-17 (×5): 1 [IU] via SUBCUTANEOUS
  Administered 2020-05-17: 21:00:00 3 [IU] via SUBCUTANEOUS
  Administered 2020-05-18 – 2020-05-19 (×5): 1 [IU] via SUBCUTANEOUS
  Administered 2020-05-20: 12:00:00 2 [IU] via SUBCUTANEOUS
  Administered 2020-05-20 (×2): 1 [IU] via SUBCUTANEOUS
  Administered 2020-05-21: 17:00:00 2 [IU] via SUBCUTANEOUS
  Administered 2020-05-21 – 2020-05-22 (×3): 1 [IU] via SUBCUTANEOUS
  Administered 2020-05-22: 22:00:00 2 [IU] via SUBCUTANEOUS
  Administered 2020-05-23 – 2020-05-25 (×9): 1 [IU] via SUBCUTANEOUS
  Administered 2020-05-26: 23:00:00 2 [IU] via SUBCUTANEOUS
  Administered 2020-05-26 – 2020-05-29 (×7): 1 [IU] via SUBCUTANEOUS
  Administered 2020-05-29: 17:00:00 2 [IU] via SUBCUTANEOUS
  Administered 2020-05-29: 23:00:00 1 [IU] via SUBCUTANEOUS

## 2020-05-15 MED ORDER — MIDODRINE HCL 10 MG PO TABS: 10.0000 mg | ORAL_TABLET | Freq: Three times a day (TID) | ORAL | 0 refills | Status: DC

## 2020-05-15 MED ORDER — BRINZOLAMIDE 1 % OP SUSP: 1.0000 [drp] | Freq: Three times a day (TID) | OPHTHALMIC | 12 refills | Status: DC

## 2020-05-15 MED ORDER — CAMPHOR-MENTHOL 0.5-0.5 % EX LOTN: TOPICAL_LOTION | CUTANEOUS | 0 refills | Status: DC | PRN

## 2020-05-15 MED ORDER — GUAIFENESIN-DM 100-10 MG/5ML PO SYRP: 5.0000 mL | ORAL_SOLUTION | Freq: Four times a day (QID) | ORAL | Status: DC | PRN

## 2020-05-15 MED ORDER — SEVELAMER CARBONATE 0.8 G PO PACK: 1.6000 g | PACK | Freq: Three times a day (TID) | ORAL | 0 refills | Status: DC

## 2020-05-15 MED ORDER — DIPHENHYDRAMINE HCL 12.5 MG/5ML PO ELIX
12.5000 mg | ORAL_SOLUTION | Freq: Four times a day (QID) | ORAL | Status: DC | PRN
  Administered 2020-05-17 – 2020-05-29 (×9): 25 mg via ORAL
  Filled 2020-05-15 (×8): qty 10

## 2020-05-15 MED ORDER — JUVEN PO PACK
1.0000 | PACK | Freq: Two times a day (BID) | ORAL | Status: DC
  Administered 2020-05-16 – 2020-05-29 (×23): 1 via ORAL
  Filled 2020-05-15 (×23): qty 1

## 2020-05-15 MED ORDER — HEPARIN SODIUM (PORCINE) 5000 UNIT/ML IJ SOLN: 5000.0000 [IU] | Freq: Three times a day (TID) | INTRAMUSCULAR | 0 refills | Status: DC

## 2020-05-15 MED ORDER — POLYETHYLENE GLYCOL 3350 17 G PO PACK: 17.0000 g | PACK | Freq: Every day | ORAL | 0 refills | Status: DC | PRN

## 2020-05-15 MED ORDER — GLUCERNA SHAKE PO LIQD
237.0000 mL | Freq: Three times a day (TID) | ORAL | Status: DC
  Administered 2020-05-15 – 2020-05-27 (×12): 237 mL via ORAL

## 2020-05-15 MED ORDER — CHLORHEXIDINE GLUCONATE 0.12% ORAL RINSE (MEDLINE KIT)
15.0000 mL | Freq: Two times a day (BID) | OROMUCOSAL | Status: DC
  Administered 2020-05-15 – 2020-05-30 (×21): 15 mL via OROMUCOSAL

## 2020-05-15 MED ORDER — OXYCODONE HCL 5 MG PO TABS
10.0000 mg | ORAL_TABLET | ORAL | Status: DC | PRN
  Administered 2020-05-15 – 2020-05-28 (×16): 10 mg via ORAL
  Filled 2020-05-15 (×17): qty 2

## 2020-05-15 MED ORDER — CHLORHEXIDINE GLUCONATE 0.12% ORAL RINSE (MEDLINE KIT): 15.0000 mL | Freq: Two times a day (BID) | OROMUCOSAL | 0 refills | Status: DC

## 2020-05-15 NOTE — Progress Notes (Signed)
[facility] NEPHROLOGY PROGRESS NOTE  Assessment/ Plan: Pt is a 49 y.o. yo female  w/ ESRD on TTS HD admitted for COVID PNA on 02/13/20. She was intubated. SP CRRT around 9/26- 10/7. Now is on intermittent HD MWF here. She is sp trach. She had MSSA pna. DM2.Then she had large sacral decub requiring I&D x 2.    Physical Exam: General: Comfortable, has tracheostomy Heart:RRR, s1s2 nl Lungs: Bibasal rhonchi and some coarse breath sound Abdomen:soft, Non-tender, non-distended Extremities: No edema Dialysis Access: Left IJ tunnel HD catheter   OP HD:TTS  4h 3mn 450/800 2/2.25 bath 111.5kg Hep 2000 TDC  - L AVF clotted while here, using TDC now  - darbe 50 ug q week, last 9/7 - calc 1.0 tiw - 9/9 Hb 10.0, tsat 22%  Problems: 1. COVID pna / sp trach / MSSA PNA - off vent on trach collar. Per CCM possible decannulation soon.  2. Hypotension - BP's stable on midodrine 10 tid. Monitor BP. 3. Stage IV sacral decub - sp I&D x 2.  SP meropenem course. Wound care, general surgery is following, wound is improving.  4. ESRD - S/P CRRT, now HD TTS.  HD today.  5. Anemia ckd/ critical illness - on darbe 150 weekly, 11/12 Ferritin >4000, iron sat 11. With active infection and ^^ ferritin no IV iron. tx prbc's PRN. Rechecked iron panel-- Ferritin 2777 and %sat 18, hold off on IV iron for now. 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 Monitor 7. HD access: LUA AVF clotted here during acute covid. TDC x2 per IR 8. Dispo: Considering inpatient rehab.  RKelly Splinter MD 05/15/2020, 4:01 PM      Subjective: Seen and examined.  Denies nausea vomiting chest pain or shortness of breath.  No new event.   Objective Vital signs in last 24 hours: Vitals:   05/15/20 1432 05/15/20 1500 05/15/20 1502 05/15/20 1523  BP: (!) 87/62  (!) 81/33 (!) 87/56  Pulse: (!) 117  (!) 113 (!) 104  Resp: 19  16 (!) 22  Temp:    98.1 F (36.7 C)  TempSrc:     Oral  SpO2: 94%  99% 95%  Weight:  89.7 kg  89.7 kg  Height:       Weight change:   Intake/Output Summary (Last 24 hours) at 05/15/2020 1601 Last data filed at 05/15/2020 1523 Gross per 24 hour  Intake 600 ml  Output 3058 ml  Net -2458 ml       Labs: Basic Metabolic Panel: Recent Labs  Lab 05/13/20 0327 05/14/20 0346 05/15/20 0329  NA 135 135 137  K 4.4 5.1 5.9*  CL 98 97* 100  CO2 _0 GLUCOSE 124* 208* 115*  BUN 33* 63* 90*  CREATININE 3.71* 5.84* 7.41*  CALCIUM 10.1 10.4* 10.4*  PHOS 4.4 5.8* 6.0*   Liver Function Tests: Recent Labs  Lab 05/13/20 0327 05/14/20 0346 05/15/20 0329  ALBUMIN 2.2* 2.1* 2.2*   No results for input(s): LIPASE, AMYLASE in the last 168 hours. No results for input(s): AMMONIA in the last 168 hours. CBC: Recent Labs  Lab 05/10/20 0629 05/12/20 0754 05/15/20 1217  WBC 9.5 12.5* 13.6*  HGB 7.2* 7.2* 7.2*  HCT 23.6* 23.3* 23.3*  MCV 89.1 90.3 89.3  PLT 489* 449* 421*   Cardiac Enzymes: No results for input(s): CKTOTAL, CKMB, CKMBINDEX, TROPONINI in the last 168 hours. CBG: Recent Labs  Lab 05/14/20 1607 05/14/20 1904 05/15/20  0015 05/15/20 0356 05/15/20 0715  GLUCAP 210* 161* 129* 117* 141*    Iron Studies: No results for input(s): IRON, TIBC, TRANSFERRIN, FERRITIN in the last 72 hours. Studies/Results: No results found.  Medications: Infusions: . sodium chloride 250 mL (04/08/20 2040)  . levothyroxine      Scheduled Medications: . B-complex with vitamin C  1 tablet Oral Daily  . brimonidine  1 drop Both Eyes TID  . brinzolamide  1 drop Both Eyes TID  . chlorhexidine gluconate (MEDLINE KIT)  15 mL Mouth Rinse BID  . Chlorhexidine Gluconate Cloth  6 each Topical Daily  . Chlorhexidine Gluconate Cloth  6 each Topical Q0600  . clonazepam  0.25 mg Oral Daily  . collagenase   Topical Daily  . darbepoetin (ARANESP) injection - DIALYSIS  150 mcg Intravenous Q Thu-HD  . feeding supplement (GLUCERNA SHAKE)   237 mL Oral TID WC  . heparin injection (subcutaneous)  5,000 Units Subcutaneous Q8H  . heparin sodium (porcine)      . hydrocerin   Topical Daily  . insulin aspart  0-6 Units Subcutaneous Q4H  . insulin glargine  3 Units Subcutaneous Daily  . latanoprost  1 drop Both Eyes q AM  . midodrine  10 mg Oral TID WC  . multivitamin  1 tablet Oral QHS  . nutrition supplement (JUVEN)  1 packet Oral BID BM  . oxyCODONE  10 mg Oral Q6H  . pantoprazole  40 mg Oral Daily  . QUEtiapine  50 mg Oral QHS  . sevelamer carbonate  1.6 g Oral TID WC  . sodium chloride flush  10-40 mL Intracatheter Q12H    have reviewed scheduled and prn medications.

## 2020-05-15 NOTE — Progress Notes (Signed)
Physical Therapy Treatment Patient Details Name: Wanda Ramirez MRN: [id number] DOB: [birth year] Today's Date: 05/15/2020    History of Present Illness 49 yo admitted 9/13 with Covid PNA, intubated 9/24-10/6, reintubated 10/8, CRRT 9/26-10/7, trach and bronch 10/18. Pt returned to vent 10/30-11/7. Pt with significant sacral wound to bone s/p I&D x 2. Pt with hemorrhage from wound 11/14, hemorrhagic shock 11/15.Wound VAc replaced 12/8-12/13. PMhx: ESRD TTS, obesity, HTN, GERD, hypothyroidism, DM, asthma    PT Comments    Pt shows increased gait endurance this session with increased intervals of gait distance before needing seated rest breaks. Needed verbal cueing to maintain erect posture at end of gait intervals as pt became fatigued. HR got up to 143 bpm with gait, but was able to decrease it to safe ranges with seated rest breaks. Sats were between 92-97% SpO2 throughout. Reported that she sat up in the chair for 2 hours after yesterdays session.   Follow Up Recommendations  CIR;Supervision for mobility/OOB     Equipment Recommendations  Rolling walker with 5" wheels;3in1 (PT);Hospital bed;Wheelchair (measurements PT);Wheelchair cushion (measurements PT);Other (comment)    Recommendations for Other Services       Precautions / Restrictions Precautions Precautions: Fall Precaution Comments: trach, sacral wound Restrictions Weight Bearing Restrictions: No    Mobility  Bed Mobility Overal bed mobility: Needs Assistance Bed Mobility: Supine to Sit     Supine to sit: Supervision     General bed mobility comments: supervision for safety  Transfers Overall transfer level: Needs assistance Equipment used: Rolling walker (2 wheeled) Transfers: Sit to/from Stand Sit to Stand: Min guard         General transfer comment: 6x sit to stasnd for ambulation (sat impulsively 2x without breaks being locked), 1x sit to stand to side step towards [facility]  Ambulation/Gait Ambulation/Gait  assistance: Min guard Gait Distance (Feet): 20 Feet Assistive device: Rolling walker (2 wheeled)     Gait velocity interpretation: <1.31 ft/sec, indicative of household ambulator General Gait Details: 5', 8', 5', 20', 20', 10' gait trials with seat rest breaks for about 30 seconds between each trial and close chair follow; cues needed to stand up staight as pt fatigued   Stairs             Wheelchair Mobility    Modified Rankin (Stroke Patients Only)       Balance Overall balance assessment: Needs assistance Sitting-balance support: Feet supported;Bilateral upper extremity supported Sitting balance-Leahy Scale: Poor Sitting balance - Comments: reliant on bil UE support for pressure relief from sacral wound Postural control: Right lateral lean Standing balance support: Bilateral upper extremity supported Standing balance-Leahy Scale: Poor Standing balance comment: reliant on bil UE support on RW to maintain static and dynamic standing as well as energy conservation                            Cognition Arousal/Alertness: Awake/alert Behavior During Therapy: WFL for tasks assessed/performed Overall Cognitive Status: Within Functional Limits for tasks assessed                                        Exercises General Exercises - Lower Extremity Long Arc Quad: AROM;Both;10 reps;Seated;Strengthening Hip Flexion/Marching: AROM;Both;10 reps;Seated;Strengthening    General Comments General comments (skin integrity, edema, etc.): on room air with trach capped      Pertinent Vitals/Pain Pain  Assessment: Faces Faces Pain Scale: Hurts little more Pain Location: L buttock during gait and sitting Pain Descriptors / Indicators: Grimacing;Discomfort Pain Intervention(s): Limited activity within patient's tolerance;Monitored during session;Repositioned    Home Living                      Prior Function            PT Goals (current  goals can now be found in the care plan section) Acute Rehab PT Goals Patient Stated Goal: go to rehab Progress towards PT goals: Progressing toward goals    Frequency    Min 3X/week      PT Plan Current plan remains appropriate    Co-evaluation              AM-PAC PT "6 Clicks" Mobility   Outcome Measure  Help needed turning from your back to your side while in a flat bed without using bedrails?: A Little Help needed moving from lying on your back to sitting on the side of a flat bed without using bedrails?: A Little Help needed moving to and from a bed to a chair (including a wheelchair)?: A Little Help needed standing up from a chair using your arms (e.g., wheelchair or bedside chair)?: A Little Help needed to walk in hospital room?: A Little Help needed climbing 3-5 steps with a railing? : Total 6 Click Score: 16    End of Session Equipment Utilized During Treatment: Gait belt Activity Tolerance: Patient tolerated treatment well Patient left: in chair;with call bell/phone within reach;with chair alarm set Nurse Communication: Mobility status PT Visit Diagnosis: Muscle weakness (generalized) (M62.81);Difficulty in walking, not elsewhere classified (R26.2);Pain Pain - Right/Left: Left Pain - part of body: Hip     Time: 0722-0747 PT Time Calculation (min) (ACUTE ONLY): 25 min  Charges:  $Gait Training: 23-37 mins                     [place], SPT [id number]   Kellin Bartling 05/15/2020, 8:50 AM

## 2020-05-15 NOTE — Progress Notes (Signed)
Patient was de cannulated at 16:04 without any complications. Gauze and soft tape was placed over the stoma site.

## 2020-05-15 NOTE — PMR Pre-admission (Signed)
PMR Admission Coordinator Pre-Admission Assessment  Patient: Wanda Ramirez is an 49 y.o., female MRN: [id number] DOB: 23-Jun-1970 Height: '5\' 2"'  (157.5 cm) Weight: 100 kg  Insurance Information  PRIMARY: Medicaid [employer]      Policy#: [id number]      Subscriber: pt Benefits:  Phone #: passport one online     Name: 12/13  Eff. Date: active MADCY    CIR: per Medicaid guidelines        Financial Counselor:       Phone#:   The Data Collection Information Summary for patients in Inpatient Rehabilitation Facilities with attached Privacy Act Needles Records was provided and verbally reviewed with: N/A  Emergency Contact Information Contact Information    Name Relation Home Work Mobile   Marsch,Tiona Daughter   [phone]   Mayreli, Alden Daughter   [phone]   [facility] Sister [phone]     Clearence Ped Daughter   [phone]      Current Medical History  Patient Admitting Diagnosis: Debility Post COVID  History of Present Illness: 49 year old presented on 02/13/2020 via EMS from dialysis center. Had tested positive for COVID 19 a few days prior. Presented with SOB, cough and fever. Past medical history significant for ESRD dialysis dependent, diabetes, peripheral neuropathy and sleep apnea with use of CPAP, asthma with chronic O2 dependence 3 liters at baseline.   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  Patient stable on room air and pulmonologist capped trach on 12/13 with plans for decannulation. Daily dressing changes and if stoma does not close within 4 weeks, pulmonology recommends ENT consultation to consider surgical closure.  Necrotizing soft tissue of sacral wound status debridement 11/12 and 11/15. Wound VAC used but due to increased drainage with poor VAC seal on 12/13, surgery felt BID dressing changes with Dakens solution  and to reassess for possible replacement of VAC pending. Patient continues to offload pressure from this area in bed and as well as when up in chair. Pain management as needed for severe pain and with dressing changes. Intra-operative cultures revealed MDR organisms (e.coli resistant to ampicillin, cephalosporins, bactrim and e. Faecalis pan sensitive).   ESRD with Nephrology management of dialysis. To continue midodrine with HD and Aranesp.   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 To continue Seroquel nightly and Klonopin daily as needed for depression and anxiety.   Patient's [facility] has been reviewed by the rehabilitation admission coordinator and physician.  Past Medical History  Past Medical History:  Diagnosis Date   Arthritis    Asthma    Chronic back pain    Diabetes mellitus (HCC)    ESRD (end stage renal disease) on dialysis ([place])    TTS   Essential hypertension    GERD (gastroesophageal reflux disease)    Glaucoma    Hyperlipidemia    Hypothyroidism    Morbid obesity (HCC)    Peripheral neuropathy    Sleep apnea    wears CPAP, does not know setting    Family History   family history includes Diabetes in her mother; Hyperlipidemia in her mother; Kidney disease in her father.  Prior Rehab/Hospitalizations Has the patient had prior rehab or hospitalizations prior to admission? Yes  Has the patient had major surgery during 100 days prior to admission? Yes   Current  Medications  Current Facility-Administered Medications:    0.9 %  sodium chloride infusion, , Intravenous, PRN, Spero Geralds, MD, Last Rate: 10 mL/hr at 04/08/20 2040, 250 mL at 04/08/20 2040   acetaminophen (TYLENOL) 160 MG/5ML solution 650 mg, 650 mg, Oral, Q6H PRN, Marianna Payment, MD   albuterol (PROVENTIL) (2.5 MG/3ML) 0.083% nebulizer solution 2.5 mg, 2.5 mg,  Nebulization, Q3H PRN, Anders Simmonds, MD, 2.5 mg at 05/04/20 1530   B-complex with vitamin C tablet 1 tablet, 1 tablet, Oral, Daily, Marianna Payment, MD, 1 tablet at 05/15/20 1036   brimonidine (ALPHAGAN) 0.2 % ophthalmic solution 1 drop, 1 drop, Both Eyes, TID, Marianna Payment, MD, 1 drop at 05/15/20 1040   brinzolamide (AZOPT) 1 % ophthalmic suspension 1 drop, 1 drop, Both Eyes, TID, Marianna Payment, MD, 1 drop at 05/15/20 1045   camphor-menthol (SARNA) lotion, , Topical, PRN, Dellinger, Bobby Rumpf, PA-C, Given at 05/07/20 2254   chlorhexidine gluconate (MEDLINE KIT) (PERIDEX) 0.12 % solution 15 mL, 15 mL, Mouth Rinse, BID, Ramaswamy, Murali, MD, 15 mL at 05/15/20 0748   Chlorhexidine Gluconate Cloth 2 % PADS 6 each, 6 each, Topical, Daily, Candee Furbish, MD, 6 each at 05/15/20 1036   Chlorhexidine Gluconate Cloth 2 % PADS 6 each, 6 each, Topical, Q0600, Rosita Fire, MD, 6 each at 05/15/20 0747   clonazePAM (KLONOPIN) disintegrating tablet 0.25 mg, 0.25 mg, Oral, Daily, Marianna Payment, MD, 0.25 mg at 05/15/20 1036   collagenase (SANTYL) ointment, , Topical, Daily, Norm Parcel, PA-C, Given at 05/15/20 1037   Darbepoetin Alfa (ARANESP) injection 150 mcg, 150 mcg, Intravenous, Q Thu-HD, Roney Jaffe, MD, 150 mcg at 05/10/20 1031   feeding supplement (GLUCERNA SHAKE) (GLUCERNA SHAKE) liquid 237 mL, 237 mL, Oral, TID WC, Hoffman, Erik C, DO, 237 mL at 05/15/20 1038   fentaNYL (SUBLIMAZE) injection 100-200 mcg, 100-200 mcg, Intravenous, Q2H PRN, Dellinger, Marianne L, PA-C, 100 mcg at 05/09/20 1316   heparin injection 5,000 Units, 5,000 Units, Subcutaneous, Q8H, Christian, Rylee, MD, 5,000 Units at 05/15/20 0505   hydrocerin (EUCERIN) cream, , Topical, Daily, Spero Geralds, MD, Given at 05/14/20 2203   influenza vac split quadrivalent PF (FLUARIX) injection 0.5 mL, 0.5 mL, Intramuscular, Prior to discharge, Axel Filler, MD   insulin aspart (novoLOG) injection  0-6 Units, 0-6 Units, Subcutaneous, Q4H, Marianna Payment, MD, 2 Units at 05/14/20 1650   insulin aspart (novoLOG) injection 2 Units, 2 Units, Subcutaneous, TID WC PRN, Marianna Payment, MD   insulin glargine (LANTUS) injection 3 Units, 3 Units, Subcutaneous, Daily, Marianna Payment, MD, 3 Units at 05/14/20 2204   latanoprost (XALATAN) 0.005 % ophthalmic solution 1 drop, 1 drop, Both Eyes, q AM, Marianna Payment, MD, 1 drop at 05/15/20 0751   levothyroxine (SYNTHROID) tablet 125 mcg, 125 mcg, Oral, Q0600, Marianna Payment, MD, 125 mcg at 05/15/20 0505   lip balm (CARMEX) ointment, , Topical, PRN, Brand Males, MD, Given at 03/27/20 0536   midodrine (PROAMATINE) tablet 10 mg, 10 mg, Oral, TID WC, Marianna Payment, MD, 10 mg at 05/15/20 0747   multivitamin (RENA-VIT) tablet 1 tablet, 1 tablet, Oral, QHS, Marianna Payment, MD, 1 tablet at 05/14/20 2206   nutrition supplement (JUVEN) (JUVEN) powder packet 1 packet, 1 packet, Oral, BID BM, Lucious Groves, DO, 1 packet at 05/15/20 1038   ondansetron (ZOFRAN) injection 4 mg, 4 mg, Intravenous, Q6H PRN, [name], NP, 4 mg at 04/22/20 0001   oxyCODONE (Oxy IR/ROXICODONE) immediate release tablet 10 mg, 10  mg, Oral, Q6H, Marianna Payment, MD, 10 mg at 05/15/20 1048   pantoprazole (PROTONIX) EC tablet 40 mg, 40 mg, Oral, Daily, Marianna Payment, MD, 40 mg at 05/15/20 1036   polyethylene glycol (MIRALAX / GLYCOLAX) packet 17 g, 17 g, Oral, Daily PRN, Marianna Payment, MD   QUEtiapine (SEROQUEL) tablet 50 mg, 50 mg, Oral, QHS, Marianna Payment, MD, 50 mg at 05/14/20 2205   sevelamer carbonate (RENVELA) packet 1.6 g, 1.6 g, Oral, TID WC, Rosita Fire, MD, 1.6 g at 05/15/20 0747   silver nitrate applicators applicator 1 application, 1 application, Topical, PRN, Candee Furbish, MD   sodium chloride flush (NS) 0.9 % injection 10-40 mL, 10-40 mL, Intracatheter, Q12H, Hoffman, Erik C, DO, 10 mL at 05/15/20 1048   sodium chloride flush (NS) 0.9 % injection  10-40 mL, 10-40 mL, Intracatheter, PRN, Lucious Groves, DO, 10 mL at 03/31/20 1130  Patients Current Diet:  Diet Order            Diet - low sodium heart healthy           Diet Carb Modified Fluid consistency: Thin; Room service appropriate? Yes with Assist  Diet effective now                 Precautions / Restrictions Precautions Precautions: Fall Precaution Comments: trach, sacral wound Other Brace: bil prevalons Restrictions Weight Bearing Restrictions: No   Has the patient had 2 or more falls or a fall with injury in the past year? No  Prior Activity Level Community (5-7x/wk): Independent; drove, disabled  Prior Functional Level Self Care: Did the patient need help bathing, dressing, using the toilet or eating? Independent  Indoor Mobility: Did the patient need assistance with walking from room to room (with or without device)? Independent  Stairs: Did the patient need assistance with internal or external stairs (with or without device)? Independent  Functional Cognition: Did the patient need help planning regular tasks such as shopping or remembering to take medications? Independent  Home Assistive Devices / Equipment Home Assistive Devices/Equipment: None Home Equipment: None  Prior Device Use: Indicate devices/aids used by the patient prior to current illness, exacerbation or injury? None of the above  Current Functional Level Cognition  Overall Cognitive Status: Within Functional Limits for tasks assessed Difficult to assess due to: Tracheostomy Current Attention Level: Selective Orientation Level: Oriented X4 Following Commands: Follows one step commands inconsistently,Follows one step commands with increased time Safety/Judgement: Decreased awareness of deficits,Decreased awareness of safety General Comments: Not specifically tested, conversation was normal and processing speed/sequencing seemed intact.    Extremity Assessment (includes  Sensation/Coordination)  Upper Extremity Assessment: Generalized weakness (fine motor coordination not assessed this session) RUE Deficits / Details: able to maintain grasp on yonker and tooth brush (sponge) attached to suction;demonstrated poor coordination with brushing motions RUE Coordination: decreased fine motor,decreased gross motor LUE Deficits / Details: grossly 3-/5 throughout; initially resistive to some movements but able to progress through full PROM  LUE Coordination: decreased fine motor,decreased gross motor  Lower Extremity Assessment: Defer to PT evaluation RLE Deficits / Details: pt with 2/5 in bil toes and ankles, seems stronger on her left or more rigid from being tight, often can resist my ROM, but unable to preform some MMT when asked.  LLE Deficits / Details: left leg either tighter or stronger than R leg, resisted Knee flexion and ankle PF likely due to pain.     ADLs  Overall ADL's : Needs assistance/impaired Eating/Feeding: Set  up,Sitting Eating/Feeding Details (indicate cue type and reason): likely would need supported seating for extended eating/drinking session Grooming: Set up,Minimal assistance,Sitting Grooming Details (indicate cue type and reason): setup in supported seating, likely min A for full grooming session in unsupported sitting Upper Body Bathing: Moderate assistance Upper Body Bathing Details (indicate cue type and reason): pt applied lotion to arms at bed level Lower Body Bathing: Maximal assistance Lower Body Bathing Details (indicate cue type and reason): pt applied lotion to LLE (hip to knee) while in sidelying position Upper Body Dressing : Maximal assistance,Sitting Lower Body Dressing: Total assistance,+2 for physical assistance,+2 for safety/equipment Lower Body Dressing Details (indicate cue type and reason): total assist to don socks  Toilet Transfer: Moderate assistance,+2 for physical assistance,+2 for safety/equipment (sara  stedy) Toilet Transfer Details (indicate cue type and reason): simulated with EOB to recliner. Utilized sara stedy with pt able to come to full standing position 2x with mod A +2  Fatigued afterwards but in good spirits sitting up in recliner for the first time in several weeks!! Toileting - Clothing Manipulation Details (indicate cue type and reason): did not assess General ADL Comments: bed mobility completed then EOB to recliner utilizing     Mobility  Overal bed mobility: Needs Assistance Bed Mobility: Supine to Sit Rolling: Supervision Sidelying to sit: Min guard Supine to sit: Supervision Sit to supine: Min guard Sit to sidelying: Min guard General bed mobility comments: supervision for safety    Transfers  Overall transfer level: Needs assistance Equipment used: Rolling walker (2 wheeled) Transfer via Lift Equipment: Stedy Transfers: Sit to/from [employer] to Stand: Min guard Stand pivot transfers: Min assist General transfer comment: 6x sit to stasnd for ambulation (sat impulsively 2x without breaks being locked), 1x sit to stand to side step towards [employer]    Ambulation / Gait / Stairs / Wheelchair Mobility  Ambulation/Gait Ambulation/Gait assistance: Counsellor (Feet): 20 Feet Assistive device: Rolling walker (2 wheeled) Gait Pattern/deviations: Trunk flexed,Shuffle,Step-to pattern,Decreased stride length General Gait Details: 5', 8', 5', 20', 20', 10' gait trials with seat rest breaks for about 30 seconds between each trial and close chair follow; cues needed to stand up staight as pt fatigued Gait velocity interpretation: <1.31 ft/sec, indicative of household ambulator    Posture / Balance Dynamic Sitting Balance Sitting balance - Comments: reliant on bil UE support for pressure relief from sacral wound Balance Overall balance assessment: Needs assistance Sitting-balance support: Feet supported,Bilateral upper extremity supported Sitting balance-Leahy Scale:  Poor Sitting balance - Comments: reliant on bil UE support for pressure relief from sacral wound Postural control: Right lateral lean Standing balance support: Bilateral upper extremity supported Standing balance-Leahy Scale: Poor Standing balance comment: reliant on bil UE support on RW to maintain static and dynamic standing as well as energy conservation    Special needs/care consideration Size wise overlay mattress Trach Shiley XLT proximal 6 uncuffed capped on 12/14 with plans to de cannulate ESRD dialysis T TH SAT Incontinent of urine infrequently per patient. Continent of BM without contamination of wound bed. [place] nurse consulted on 10/25 Currently BID dressing change with Dakins dressing per Holiday on 12/13 and wound VAC discontinued. Also followed by [place] surgery. May resume MWF VAC change on 12/20. VAC was not sealing well with drainage. Please refer to photos in acute chart from 05/14/20.   Previous Home Environment  Living Arrangements: Alone (son lived with her and worked during the week; went to his home on the weekends per patient)  Lives With: Alone,Son Available Help at Discharge: Family,Available 24 hours/day (Tiona and siblings to provide 24/7 care at d/c as discussed) Type of Home: Apartment Home Layout: Two level,Bed/bath upstairs,1/2 bath on main level Home Access: Level entry Bathroom Shower/Tub: Chiropodist: Standard Bathroom Accessibility: Yes How Accessible: Accessible via walker Home Care Services: Yes Type of Home Care Services: Homehealth aide (daughter, Barnie Alderman, was patient's CAPS aide pta) Additional Comments: information gathered in chart  Discharge Living Setting Plans for Discharge Living Setting: Patient's home,Alone,Apartment (son lives wiht her during the week and works) Type of Home at Discharge: Apartment Discharge Home Layout: Two level,1/2 bath on main level,Bed/bath upstairs Alternate Level Stairs-Number of Steps:  12 Discharge Home Access: Level entry Discharge Bathroom Shower/Tub: Tub/shower unit Discharge Bathroom Toilet: Standard Discharge Bathroom Accessibility: Yes How Accessible: Accessible via walker Does the patient have any problems obtaining your medications?: No  Social/Family/Support Systems Contact Information: Tiona , daughter, patient requests to be main contact of her children Anticipated Caregiver: Barnie Alderman and Jamas Lav and other family members Anticipated Ambulance person Information: see above Ability/Limitations of Caregiver: Barnie Alderman was her CAPS aide pta; daughters concerned about caring for her wound at discharge Caregiver Availability: 24/7 Discharge Plan Discussed with Primary Caregiver: Yes Is Caregiver In Agreement with Plan?: Yes Does Caregiver/Family have Issues with Lodging/Transportation while Pt is in Rehab?: No  Goals Patient/Family Goal for Rehab: Mod I to supervision with PT, min asisst OT and mod I with SLP Expected length of stay: ELOS 2 to 3 weeks Additional Information: ESRD with outpatient dialysis T TH SAT Pt/Family Agrees to Admission and willing to participate: Yes Program Orientation Provided & Reviewed with Pt/Caregiver Including Roles  & Responsibilities: Yes Additional Information Needs: Daughters have hesitancy about caring for wound; Patient will need to be able to sit for outpatient dialysis 5 to 6 hours to include transport time  Barriers to Discharge: Other (comments) (ability to tolerate sitting for 5 to 6 hours for outpatient dialysis and children's ability to care for wound)  Decrease burden of Care through IP rehab admission: n/a  Possible need for SNF placement upon discharge: Tiona and [name] talk about possible SNF after CIR, But patient states she will not go to SNF, I have spoken with [facility] leadership about need for assistance for SNF placement if needed.  Patient Condition: I have reviewed medical records from [facility] , spoken  with  patient and daughter. I met with patient at the bedside for inpatient rehabilitation assessment.  Patient will benefit from ongoing PT, OT and SLP, can actively participate in 3 hours of therapy a day 5 days of the week, and can make measurable gains during the admission.  Patient will also benefit from the coordinated team approach during an Inpatient Acute Rehabilitation admission.  The patient will receive intensive therapy as well as Rehabilitation physician, nursing, social worker, and care management interventions.  Due to bladder management, bowel management, safety, skin/wound care, disease management, medication administration, pain management and patient education the patient requires 24 hour a day rehabilitation nursing.  The patient is currently min to mod assist overall with mobility and basic ADLs.  Discharge setting and therapy post discharge at home with home health is anticipated.  Patient has agreed to participate in the Acute Inpatient Rehabilitation Program and will admit today.  Preadmission Screen Completed By:  Cleatrice Burke, 05/15/2020 11:11 AM ______________________________________________________________________   Discussed status with Dr. Naaman Plummer  on  05/15/2020 at 1138 and received approval for admission  today.  Admission Coordinator:  Cleatrice Burke, RN, time 0355 Date  12/14 /2021   Assessment/Plan: Diagnosis: debility after COVID 1. Does the need for close, 24 hr/day Medical supervision in concert with the patient's rehab needs make it unreasonable for this patient to be served in a less intensive setting? Yes 2. Co-Morbidities requiring supervision/potential complications: large decubitus wound, ESRD on HD, morbid obesity, DM, OSA on CPAP 3. Due to bladder management, bowel management, safety, skin/wound care, disease management, medication administration, pain management and patient education, does the patient require 24 hr/day rehab nursing?  Yes 4. Does the patient require coordinated care of a physician, rehab nurse, PT, OT, and SLP to address physical and functional deficits in the context of the above medical diagnosis(es)? Yes Addressing deficits in the following areas: balance, endurance, locomotion, strength, transferring, bowel/bladder control, bathing, dressing, feeding, grooming, toileting and psychosocial support 5. Can the patient actively participate in an intensive therapy program of at least 3 hrs of therapy 5 days a week? Yes 6. The potential for patient to make measurable gains while on inpatient rehab is excellent 7. Anticipated functional outcomes upon discharge from inpatient rehab: modified independent and supervision PT, min assist OT, modified independent SLP 8. Estimated rehab length of stay to reach the above functional goals is: 14-21 days 9. Anticipated discharge destination: Home 10. Overall Rehab/Functional Prognosis: excellent   MD Signature: Meredith Staggers, MD, [facility] 05/15/2020

## 2020-05-15 NOTE — H&P (Signed)
Physical Medicine and Rehabilitation Admission H&P        Chief Complaint  Patient presents with  . Functional deficits due to debility      HPI:  Wanda Ramirez is a 49 year old female with history of T2DM, ESRD-HD TTS, morbid obesity, chronic respiratory failure--3 L oxygen dependent who was admitted on 02/13/20 with acute on chronic respiratory failure due to Covid 19 PNA. She was treated with remdesiver, steroids, tocilizumab X 1 as well as broad spectrum antibiotics but developed lethargy with increase in oxygen needs due to ARDS and was intubated 09/26. V/Q scan negative for PE. Nephrology following for management of HD and required CRRT due to hypotension.  She has had issues with LUA AVF clotting requring TDC X 2 (last by Dr. Anselm Pancoast on 09/28). She developed thrombocytopenia --HIT panel negative and was kept fully anticoagulated with IV heparin due to RV dysfunction and elevated D dimer. She was extubated on 10/06 but developed stridor with increased WOB and fever T-103 due to MSSA PNA requiring reintubation and ultimately required  tracheostomy 10/18.    She was on tube feeds for nutritional support but developed diarrhea requiring flexiseal. She developed large stage IV left buttock/sacral wound with exposed sacrum and bloody drainage with anemia and fevers. She was taken to OR for I & D 11/02 by Dr. Bobbye Morton (4 units PRBC/500 cc albumin administered perioperatively). Wound cultures positive for enteroccocus faecalis and ESBL E coli and treated with Meropenum She continued to have loose stools with oozing requiring frequent dressing changes. Hydrotherapy ongoing and wound VAC placed briefly but wound started developing significant  "C-diff type" odor with thick yellow drainage on 12/01. Wound VAC was removed with dakins used for local care and as drainage resolved replaced 12/06 but VAC not maintaining seal due to significant amount of wound drainage. Decision made by WOC/CCS to  discontinue VAC on 12/13 with moist damp to dressing changes for now. CCS recommends replacing wound VAC if drainage resolves --in approx a week or so?     Issues with delirium/metabolic encephalopathy has resolved. She was extubated to BIPAP, tolerated capping and to be decannulated today. Dr. Tamala Julian recommends ENT referral if stoma does not close in 4 weeks. Hypotension being managed with midodrine on board and anemia of chronic illness being treated with aranesp --IV iron on hold due to infection. She is tolerating regular diet and showing improvement in activity tolerance. Therapy ongoing and CIR recommended due to functional decline.       Review of Systems  Constitutional: Negative for chills and fever.  HENT: Negative for hearing loss and tinnitus.   Eyes: Positive for blurred vision. Negative for double vision.  Respiratory: Negative for cough and shortness of breath.   Cardiovascular: Negative for chest pain.  Gastrointestinal: Negative for abdominal pain, diarrhea and heartburn.  Musculoskeletal: Positive for myalgias.       Sacral pain due to decub  Skin: Negative for itching and rash.  Neurological: Positive for sensory change (numbness right hand and left foot--new since hospitalization). Negative for dizziness and headaches.  Psychiatric/Behavioral: The patient is not nervous/anxious and does not have insomnia.            Past Medical History:  Diagnosis Date  . Arthritis    . Asthma    . Chronic back pain    . Diabetes mellitus ([place])    . ESRD (end stage renal disease) on dialysis ([place])  TTS  . Essential hypertension    . GERD (gastroesophageal reflux disease)    . Glaucoma    . Hyperlipidemia    . Hypothyroidism    . Morbid obesity ([place])    . Peripheral neuropathy    . Sleep apnea      wears CPAP, does not know setting           Past Surgical History:  Procedure Laterality Date  . AV FISTULA PLACEMENT Left 04/2014  . CARDIAC CATHETERIZATION N/A 03/17/2016     Procedure: Left Heart Cath and Coronary Angiography;  Surgeon: [name], MD;  Location: [facility];  Service: Cardiovascular;  Laterality: N/A;  . INCISION AND DRAINAGE PERIRECTAL ABSCESS N/A 04/03/2020    Procedure: EXCISIONAL DEBRIDEMENT OF SACRAL AND GLUTEAL WOUNDS;  Surgeon: [name], MD;  Location: [facility];  Service: General;  Laterality: N/A;  . IR FLUORO GUIDE CV LINE LEFT   02/22/2020  . IR FLUORO GUIDE CV LINE LEFT   02/27/2020  . IR US GUIDE VASC ACCESS LEFT   02/22/2020  . KNEE SURGERY      . TUBAL LIGATION          Family History  Problem Relation Age of Onset  . Diabetes Mother    . Hyperlipidemia Mother    . Kidney disease Father        Social History:  reports that she has quit smoking. She has never used smokeless tobacco. She reports that she does not drink alcohol and does not use drugs.         Allergies  Allergen Reactions  . Hydrocodone Itching and Nausea And Vomiting          Medications Prior to Admission  Medication Sig Dispense Refill  . acetaminophen (TYLENOL) 325 MG tablet Take 650 mg by mouth every 6 (six) hours as needed for mild pain.      Marland Kitchen albuterol (PROVENTIL HFA;VENTOLIN HFA) 108 (90 Base) MCG/ACT inhaler Inhale 2 puffs into the lungs every 6 (six) hours as needed for wheezing or shortness of breath.      Marland Kitchen amLODipine (NORVASC) 10 MG tablet Take 10 mg by mouth at bedtime.      Marland Kitchen aspirin EC 81 MG tablet Take 81 mg by mouth daily.      Lorin Picket 1 GM 210 MG(Fe) tablet Take 210 mg by mouth 3 (three) times daily with meals.    3  . benzonatate (TESSALON) 100 MG capsule Take 100 mg by mouth as needed for cough.       . Brinzolamide-Brimonidine (SIMBRINZA) 1-0.2 % SUSP Place 2 drops into both eyes 3 (three) times daily.       . calcium acetate (PHOSLO) 667 MG capsule Take 1,334 mg by mouth 3 (three) times daily.   6  . cinacalcet (SENSIPAR) 90 MG tablet Take 90 mg by mouth daily.       Marland Kitchen glimepiride (AMARYL) 2 MG tablet Take  2 mg by mouth daily with supper.      Marland Kitchen glimepiride (AMARYL) 4 MG tablet Take 4 mg by mouth daily with breakfast.      . insulin regular human CONCENTRATED (HUMULIN R) 500 UNIT/ML injection Inject 3-6 Units into the skin 2 (two) times daily with a meal. Takes 3 units QAM and 6 units QHS      . levothyroxine (SYNTHROID, LEVOTHROID) 125 MCG tablet Take 125 mcg by mouth daily before breakfast.      . liraglutide (  VICTOZA) 18 MG/3ML SOPN Inject 1.8 mg into the skin daily.      Marland Kitchen loratadine (CLARITIN) 10 MG tablet Take 10 mg by mouth daily.      . metoprolol succinate (TOPROL-XL) 50 MG 24 hr tablet Take 50 mg by mouth 2 (two) times daily. Take with or immediately following a meal.       . Netarsudil Dimesylate (RHOPRESSA) 0.02 % SOLN Place 1 drop into both eyes daily.       Marland Kitchen omeprazole (PRILOSEC) 40 MG capsule Take 40 mg by mouth daily.      . ondansetron (ZOFRAN) 4 MG tablet Take 4 mg by mouth daily.      . Oxycodone HCl 10 MG TABS Take 10 mg by mouth 4 (four) times daily as needed for pain.   0  . pregabalin (LYRICA) 75 MG capsule Take 1 capsule (75 mg total) by mouth daily. Take an additional 75mg  on Dialysis days      . tiotropium (SPIRIVA) 18 MCG inhalation capsule Place 18 mcg into inhaler and inhale daily.      . Travoprost, BAK Free, (TRAVATAN) 0.004 % SOLN ophthalmic solution Place 1 drop into both eyes every morning.      . famotidine (PEPCID) 40 MG tablet Take 40 mg by mouth daily.          Drug Regimen Review  Drug regimen was reviewed and remains appropriate with no significant issues identified   Home: Home Living Family/patient expects to be discharged to:: Skilled nursing facility Living Arrangements: Children Available Help at Discharge: Family,Available PRN/intermittently Type of Home: Apartment Home Access: Level entry Home Layout: Two level,Bed/bath upstairs,1/2 bath on main level Bathroom Shower/Tub: Chiropodist: Standard Home Equipment:  None Additional Comments: information gathered in chart   Functional History: Prior Function Level of Independence: Independent Comments: pt normally cares for herself and drives to/from HD. Pt lives with son who works and has grandchildren   Functional Status:  Mobility: Bed Mobility Overal bed mobility: Needs Assistance Bed Mobility: Supine to Sit Rolling: Supervision Sidelying to sit: Min guard Supine to sit: Supervision Sit to supine: Min guard Sit to sidelying: Min guard General bed mobility comments: supervision for safety Transfers Overall transfer level: Needs assistance Equipment used: Rolling walker (2 wheeled) Transfer via Lift Equipment: Stedy Transfers: Sit to/from [employer] to Stand: Min guard Stand pivot transfers: Min assist General transfer comment: 6x sit to stasnd for ambulation (sat impulsively 2x without breaks being locked), 1x sit to stand to side step towards [facility] Ambulation/Gait Ambulation/Gait assistance: Min guard Gait Distance (Feet): 20 Feet Assistive device: Rolling walker (2 wheeled) Gait Pattern/deviations: Trunk flexed,Shuffle,Step-to pattern,Decreased stride length General Gait Details: 5', 8', 5', 20', 20', 10' gait trials with seat rest breaks for about 30 seconds between each trial and close chair follow; cues needed to stand up staight as pt fatigued Gait velocity interpretation: <1.31 ft/sec, indicative of household ambulator   ADL: ADL Overall ADL's : Needs assistance/impaired Eating/Feeding: Set up,Sitting Eating/Feeding Details (indicate cue type and reason): likely would need supported seating for extended eating/drinking session Grooming: Set up,Minimal assistance,Sitting Grooming Details (indicate cue type and reason): setup in supported seating, likely min A for full grooming session in unsupported sitting Upper Body Bathing: Moderate assistance Upper Body Bathing Details (indicate cue type and reason): pt applied lotion to arms  at bed level Lower Body Bathing: Maximal assistance Lower Body Bathing Details (indicate cue type and reason): pt applied lotion to LLE (hip to  knee) while in sidelying position Upper Body Dressing : Maximal assistance,Sitting Lower Body Dressing: Total assistance,+2 for physical assistance,+2 for safety/equipment Lower Body Dressing Details (indicate cue type and reason): total assist to don socks  Toilet Transfer: Moderate assistance,+2 for physical assistance,+2 for safety/equipment (sara stedy) Toilet Transfer Details (indicate cue type and reason): simulated with EOB to recliner. Utilized sara stedy with pt able to come to full standing position 2x with mod A +2  Fatigued afterwards but in good spirits sitting up in recliner for the first time in several weeks!! Toileting - Clothing Manipulation Details (indicate cue type and reason): did not assess General ADL Comments: bed mobility completed then EOB to recliner utilizing    Cognition: Cognition Overall Cognitive Status: Within Functional Limits for tasks assessed Orientation Level: Oriented X4 Cognition Arousal/Alertness: Awake/alert Behavior During Therapy: WFL for tasks assessed/performed Overall Cognitive Status: Within Functional Limits for tasks assessed Area of Impairment: Attention,Memory,Following commands,Safety/judgement,Awareness,Problem solving Orientation Level: Disoriented to,Place (got month and year correct) Current Attention Level: Selective Memory: Decreased short-term memory Following Commands: Follows one step commands inconsistently,Follows one step commands with increased time Safety/Judgement: Decreased awareness of deficits,Decreased awareness of safety Awareness: Emergent Problem Solving: Slow processing,Requires verbal cues General Comments: Not specifically tested, conversation was normal and processing speed/sequencing seemed intact. Difficult to assess due to: Tracheostomy     Blood pressure 111/65,  pulse (!) 104, temperature 98.5 F (36.9 C), temperature source Oral, resp. rate 20, height 5\' 2"  (1.575 m), weight 100 kg, SpO2 99 %. Physical Exam Vitals and nursing note reviewed.  Constitutional:      Appearance: Normal appearance. She is obese.  HENT:     Head: Normocephalic.     Nose: Nose normal.  Eyes:     Extraocular Movements: Extraocular movements intact.     Pupils: Pupils are equal, round, and reactive to light.  Cardiovascular:     Rate and Rhythm: Regular rhythm. Tachycardia present.  Pulmonary:     Effort: Pulmonary effort is normal.  Abdominal:     General: Bowel sounds are normal.     Palpations: Abdomen is soft.  Musculoskeletal:        General: No swelling.     Cervical back: Normal range of motion.  Skin:    Comments: Large sacral wound with serosanguinous drainage, dressing which appears to be granulating in, some exposed fat, small amount (10-15%) remaining fibronecrotic tissue  Trach stoma dressed. Some air leakage  Neurological:     Mental Status: She is alert and oriented to person, place, and time.     Sensory: No sensory deficit.     Comments: Mild dysphonia with some air escape from stoma. Otherwise normal CN exam. Demonstrates reasonable insight and awareness. Normal language. UE motor 4- to 4/5 prox to distal. LE 3+ HF to 4/5 ADF/PF with some pain inhibition d/t wound, current positioning  Psychiatric:        Mood and Affect: Mood normal.        Behavior: Behavior normal.        Lab Results Last 48 Hours        Results for orders placed or performed during the hospital encounter of 02/13/20 (from the past 48 hour(s))  Glucose, capillary     Status: Abnormal    Collection Time: 05/13/20 12:23 PM  Result Value Ref Range    Glucose-Capillary 190 (H) 70 - 99 mg/dL      Comment: Glucose reference range applies only to samples taken after fasting for  at least 8 hours.  Glucose, capillary     Status: Abnormal    Collection Time: 05/13/20  5:18 PM   Result Value Ref Range    Glucose-Capillary 251 (H) 70 - 99 mg/dL      Comment: Glucose reference range applies only to samples taken after fasting for at least 8 hours.  Glucose, capillary     Status: Abnormal    Collection Time: 05/13/20  6:22 PM  Result Value Ref Range    Glucose-Capillary 245 (H) 70 - 99 mg/dL      Comment: Glucose reference range applies only to samples taken after fasting for at least 8 hours.  Glucose, capillary     Status: Abnormal    Collection Time: 05/13/20  8:51 PM  Result Value Ref Range    Glucose-Capillary 189 (H) 70 - 99 mg/dL      Comment: Glucose reference range applies only to samples taken after fasting for at least 8 hours.  Glucose, capillary     Status: Abnormal    Collection Time: 05/13/20 11:35 PM  Result Value Ref Range    Glucose-Capillary 149 (H) 70 - 99 mg/dL      Comment: Glucose reference range applies only to samples taken after fasting for at least 8 hours.  Renal function panel     Status: Abnormal    Collection Time: 05/14/20  3:46 AM  Result Value Ref Range    Sodium 135 135 - 145 mmol/L    Potassium 5.1 3.5 - 5.1 mmol/L    Chloride 97 (L) 98 - 111 mmol/L    CO2 23 22 - 32 mmol/L    Glucose, Bld 208 (H) 70 - 99 mg/dL      Comment: Glucose reference range applies only to samples taken after fasting for at least 8 hours.    BUN 63 (H) 6 - 20 mg/dL    Creatinine, Ser 5.84 (H) 0.44 - 1.00 mg/dL      Comment: DIALYSIS    Calcium 10.4 (H) 8.9 - 10.3 mg/dL    Phosphorus 5.8 (H) 2.5 - 4.6 mg/dL    Albumin 2.1 (L) 3.5 - 5.0 g/dL    GFR, Estimated 8 (L) >60 mL/min      Comment: (NOTE) Calculated using the CKD-EPI Creatinine Equation (2021)      Anion gap 15 5 - 15      Comment: Performed at [facility] [address] [place], [place] [address]  Glucose, capillary     Status: Abnormal    Collection Time: 05/14/20  3:55 AM  Result Value Ref Range    Glucose-Capillary 204 (H) 70 - 99 mg/dL      Comment: Glucose reference  range applies only to samples taken after fasting for at least 8 hours.  Glucose, capillary     Status: None    Collection Time: 05/14/20  7:37 AM  Result Value Ref Range    Glucose-Capillary 90 70 - 99 mg/dL      Comment: Glucose reference range applies only to samples taken after fasting for at least 8 hours.  Glucose, capillary     Status: Abnormal    Collection Time: 05/14/20 12:03 PM  Result Value Ref Range    Glucose-Capillary 142 (H) 70 - 99 mg/dL      Comment: Glucose reference range applies only to samples taken after fasting for at least 8 hours.  Glucose, capillary     Status: Abnormal    Collection Time: 05/14/20  4:07 PM  Result Value Ref Range    Glucose-Capillary 210 (H) 70 - 99 mg/dL      Comment: Glucose reference range applies only to samples taken after fasting for at least 8 hours.  Glucose, capillary     Status: Abnormal    Collection Time: 05/14/20  7:04 PM  Result Value Ref Range    Glucose-Capillary 161 (H) 70 - 99 mg/dL      Comment: Glucose reference range applies only to samples taken after fasting for at least 8 hours.  Glucose, capillary     Status: Abnormal    Collection Time: 05/15/20 12:15 AM  Result Value Ref Range    Glucose-Capillary 129 (H) 70 - 99 mg/dL      Comment: Glucose reference range applies only to samples taken after fasting for at least 8 hours.  Renal function panel     Status: Abnormal    Collection Time: 05/15/20  3:29 AM  Result Value Ref Range    Sodium 137 135 - 145 mmol/L    Potassium 5.9 (H) 3.5 - 5.1 mmol/L    Chloride 100 98 - 111 mmol/L    CO2 22 22 - 32 mmol/L    Glucose, Bld 115 (H) 70 - 99 mg/dL      Comment: Glucose reference range applies only to samples taken after fasting for at least 8 hours.    BUN 90 (H) 6 - 20 mg/dL    Creatinine, Ser 7.41 (H) 0.44 - 1.00 mg/dL    Calcium 10.4 (H) 8.9 - 10.3 mg/dL    Phosphorus 6.0 (H) 2.5 - 4.6 mg/dL    Albumin 2.2 (L) 3.5 - 5.0 g/dL    GFR, Estimated 6 (L) >60 mL/min       Comment: (NOTE) Calculated using the CKD-EPI Creatinine Equation (2021)      Anion gap 15 5 - 15      Comment: Performed at [facility] [address] [place], [place] [address]  Glucose, capillary     Status: Abnormal    Collection Time: 05/15/20  3:56 AM  Result Value Ref Range    Glucose-Capillary 117 (H) 70 - 99 mg/dL      Comment: Glucose reference range applies only to samples taken after fasting for at least 8 hours.  Glucose, capillary     Status: Abnormal    Collection Time: 05/15/20  7:15 AM  Result Value Ref Range    Glucose-Capillary 141 (H) 70 - 99 mg/dL      Comment: Glucose reference range applies only to samples taken after fasting for at least 8 hours.      Imaging Results (Last 48 hours)  No results found.           Medical Problem List and Plan: 1.  Functional and mobility deficits secondary to debility after COVID and associated complications including VDRF/trach and large sacral wound             -patient may not yet shower             -ELOS/Goals: 14-21 days, mod I PT, min assist OT, mod I SLP 2.  Antithrombotics: -DVT/anticoagulation:  Pharmaceutical: Heparin             -antiplatelet therapy: N/A 3. Pain Management: Oxycodone prn.  4. Mood: LCSW to follow for evaluation and support.              -antipsychotic agents: N/A 5. Neuropsych: This patient is capable  of making decisions on her own behalf--dicussed code status -->SHE WANTS TO BE FULL CODE . 6.Large stage IV sacral/buttock wound/Wound Care: Meropenum completed 05/13/20.     -Juven tid, glucerna tid and vitamin B w/C.               -Santyl to eschar on inferior flap and wet to dry dressing changes bid and prn to keep wound clean of stool to prevent contamination.              -Pressure relief measure with air mattress.                -WBC trending upwards--recheck in am  7. Fluids/Electrolytes/Nutrition: Renal diet with 1200 cc FR. Strict I/O with daily weights.              -check labs  with HD 8.T2DM: On glucerna tid with meals. Monitor BS ac/hs--On Lantus 3 units daily with SSI for elevated BS.  9. Hypotension: Monitor BP tid--especially with increase in activity. Continue Midodrine 10 mg tid.  Hypthyroid: Stable on supplement.  10. ESRD:  HD -TTS with nephrology to assist with management. Schedule HD at the end of the day to help with tolerance of activity. Sevelmar tid for metabolic bone disease. Hyperkalemia managed with HD. 11. Anemia of critical illness/anemia of CKD: On Aranesp 150 mc/week. Hgb stable at 7.2 range past week.  Transfuse prn Hgb<7.0.   12.  Anxiety: Continue low dose klonopin in am and Seroquel at nights to help with sleep.          Bary Leriche, PA-C 05/15/2020  I have personally performed a face to face diagnostic evaluation of this patient and formulated the key components of the plan.  Additionally, I have personally reviewed laboratory data, imaging studies, as well as relevant notes and concur with the physician assistant's documentation above.  The patient's status has not changed from the original H&P.  Any changes in documentation from the acute care chart have been noted above.  Meredith Staggers, MD, [facility]

## 2020-05-15 NOTE — Progress Notes (Signed)
Meredith Staggers, MD  Physician  Physical Medicine and Rehabilitation  PMR Pre-admission      Signed  Date of Service:  05/15/2020 11:00 AM      Related encounter: ED to Hosp-Admission (Current) from 02/13/2020 in [facility]          Show:Clear all _0 Manual_1 Template_2 Copied  Added by: _3 Cristina Gong, RN_4 Meredith Staggers, MD   _5 Hover for details  PMR Admission Coordinator Pre-Admission Assessment   Patient: Wanda Ramirez is an 49 y.o., female MRN: [id number] DOB: 1971/02/23 Height: _6  (157.5 cm) Weight: 100 kg   Insurance Information   PRIMARY: Medicaid [employer]      Policy#: [id number]      Subscriber: pt Benefits:  Phone #: passport one online     Name: 12/13  Eff. Date: active MADCY    CIR: per Medicaid guidelines         Financial Counselor:       Phone#:    The "Data Collection Information Summary" for patients in Inpatient Rehabilitation Facilities with attached "Privacy Act [place] Records" was provided and verbally reviewed with: N/A   Emergency Contact Information         Contact Information     Name Relation Home Work Mobile    Durocher,Tiona Daughter     [phone]    Kuulei, Kleier Daughter     [phone]    [facility] Sister [phone]        Clearence Ped Daughter     [phone]         Current Medical History  Patient Admitting Diagnosis: Debility Post COVID   History of Present Illness: 49 year old presented on 02/13/2020 via EMS from dialysis center. Had tested positive for COVID 19 a few days prior. Presented with SOB, cough and fever. Past medical history significant for ESRD dialysis dependent, diabetes, peripheral neuropathy and sleep apnea with use of CPAP, asthma with chronic O2 dependence 3 liters at baseline.    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   Patient stable on room air and pulmonologist capped trach on 12/13 with plans for decannulation. Daily dressing changes and if stoma does not close within 4 weeks, pulmonology recommends ENT consultation to consider surgical closure.  Necrotizing soft tissue of sacral wound status debridement 11/12 and 11/15. Wound VAC used but due to increased drainage with poor VAC seal on 12/13, surgery felt BID dressing changes with Dakens solution and to reassess for possible replacement of VAC pending. Patient continues to offload pressure from this area in bed and as well as when up in chair. Pain management as needed for severe pain and with dressing changes. Intra-operative cultures revealed MDR organisms (e.coli resistant to ampicillin, cephalosporins, bactrim and e. Faecalis pan sensitive).    ESRD with Nephrology management of dialysis. To continue midodrine with HD and Aranesp.    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 To continue Seroquel nightly and Klonopin daily as needed for depression and anxiety.     Patient's medical record from [facility] has been reviewed by the rehabilitation admission coordinator and physician.   Past Medical History      Past Medical History:  Diagnosis Date  . Arthritis    . Asthma    . Chronic  back pain    . Diabetes mellitus ([place])    . ESRD (end stage renal disease) on dialysis (HCC)      TTS  . Essential hypertension    . GERD (gastroesophageal reflux disease)    . Glaucoma    . Hyperlipidemia    . Hypothyroidism    . Morbid obesity ([place])    . Peripheral neuropathy    . Sleep apnea      wears CPAP, does not know setting      Family History   family history includes Diabetes in her mother; Hyperlipidemia in her mother; Kidney disease in her father.   Prior Rehab/Hospitalizations Has the patient had prior rehab or hospitalizations  prior to admission? Yes   Has the patient had major surgery during 100 days prior to admission? Yes              Current Medications   Current Facility-Administered Medications:  .  0.9 %  sodium chloride infusion, , Intravenous, PRN, Spero Geralds, MD, Last Rate: 10 mL/hr at 04/08/20 2040, 250 mL at 04/08/20 2040 .  acetaminophen (TYLENOL) 160 MG/5ML solution 650 mg, 650 mg, Oral, Q6H PRN, Marianna Payment, MD .  albuterol (PROVENTIL) (2.5 MG/3ML) 0.083% nebulizer solution 2.5 mg, 2.5 mg, Nebulization, Q3H PRN, Anders Simmonds, MD, 2.5 mg at 05/04/20 1530 .  B-complex with vitamin C tablet 1 tablet, 1 tablet, Oral, Daily, Marianna Payment, MD, 1 tablet at 05/15/20 1036 .  brimonidine (ALPHAGAN) 0.2 % ophthalmic solution 1 drop, 1 drop, Both Eyes, TID, Marianna Payment, MD, 1 drop at 05/15/20 1040 .  brinzolamide (AZOPT) 1 % ophthalmic suspension 1 drop, 1 drop, Both Eyes, TID, Marianna Payment, MD, 1 drop at 05/15/20 1045 .  camphor-menthol (SARNA) lotion, , Topical, PRN, Dellinger, Bobby Rumpf, PA-C, Given at 05/07/20 2254 .  chlorhexidine gluconate (MEDLINE KIT) (PERIDEX) 0.12 % solution 15 mL, 15 mL, Mouth Rinse, BID, Ramaswamy, Murali, MD, 15 mL at 05/15/20 0748 .  Chlorhexidine Gluconate Cloth 2 % PADS 6 each, 6 each, Topical, Daily, Candee Furbish, MD, 6 each at 05/15/20 1036 .  Chlorhexidine Gluconate Cloth 2 % PADS 6 each, 6 each, Topical, Q0600, Rosita Fire, MD, 6 each at 05/15/20 0747 .  clonazePAM (KLONOPIN) disintegrating tablet 0.25 mg, 0.25 mg, Oral, Daily, Marianna Payment, MD, 0.25 mg at 05/15/20 1036 .  collagenase (SANTYL) ointment, , Topical, Daily, Norm Parcel, [place], Given at 05/15/20 1037 .  Darbepoetin Alfa (ARANESP) injection 150 mcg, 150 mcg, Intravenous, Q Thu-HD, Roney Jaffe, MD, 150 mcg at 05/10/20 1031 .  feeding supplement (GLUCERNA SHAKE) (GLUCERNA SHAKE) liquid 237 mL, 237 mL, Oral, TID WC, Hoffman, Erik C, DO, 237 mL at 05/15/20 1038 .  fentaNYL  (SUBLIMAZE) injection 100-200 mcg, 100-200 mcg, Intravenous, Q2H PRN, Dellinger, Marianne L, PA-C, 100 mcg at 05/09/20 1316 .  heparin injection 5,000 Units, 5,000 Units, Subcutaneous, Q8H, Christian, Rylee, MD, 5,000 Units at 05/15/20 0505 .  hydrocerin (EUCERIN) cream, , Topical, Daily, Spero Geralds, MD, Given at 05/14/20 2203 .  influenza vac split quadrivalent PF (FLUARIX) injection 0.5 mL, 0.5 mL, Intramuscular, Prior to discharge, Axel Filler, MD .  insulin aspart (novoLOG) injection 0-6 Units, 0-6 Units, Subcutaneous, Q4H, Marianna Payment, MD, 2 Units at 05/14/20 1650 .  insulin aspart (novoLOG) injection 2 Units, 2 Units, Subcutaneous, TID WC PRN, Marianna Payment, MD .  insulin glargine (LANTUS) injection 3 Units, 3 Units, Subcutaneous, Daily, Marianna Payment, MD, 3 Units at 05/14/20  2204 .  latanoprost (XALATAN) 0.005 % ophthalmic solution 1 drop, 1 drop, Both Eyes, q AM, Dellia Cloud, MD, 1 drop at 05/15/20 0751 .  levothyroxine (SYNTHROID) tablet 125 mcg, 125 mcg, Oral, Q0600, Dellia Cloud, MD, 125 mcg at 05/15/20 0505 .  lip balm (CARMEX) ointment, , Topical, PRN, Kalman Shan, MD, Given at 03/27/20 0536 .  midodrine (PROAMATINE) tablet 10 mg, 10 mg, Oral, TID WC, Dellia Cloud, MD, 10 mg at 05/15/20 0747 .  multivitamin (RENA-VIT) tablet 1 tablet, 1 tablet, Oral, QHS, Dellia Cloud, MD, 1 tablet at 05/14/20 2206 .  nutrition supplement (JUVEN) (JUVEN) powder packet 1 packet, 1 packet, Oral, BID BM, Gust Rung, DO, 1 packet at 05/15/20 1038 .  ondansetron (ZOFRAN) injection 4 mg, 4 mg, Intravenous, Q6H PRN, Simonne Martinet, NP, 4 mg at 04/22/20 0001 .  oxyCODONE (Oxy IR/ROXICODONE) immediate release tablet 10 mg, 10 mg, Oral, Q6H, Dellia Cloud, MD, 10 mg at 05/15/20 1048 .  pantoprazole (PROTONIX) EC tablet 40 mg, 40 mg, Oral, Daily, Dellia Cloud, MD, 40 mg at 05/15/20 1036 .  polyethylene glycol (MIRALAX / GLYCOLAX) packet 17 g, 17 g, Oral, Daily PRN, Dellia Cloud, MD .  QUEtiapine (SEROQUEL) tablet 50 mg, 50 mg, Oral, QHS, Dellia Cloud, MD, 50 mg at 05/14/20 2205 .  sevelamer carbonate (RENVELA) packet 1.6 g, 1.6 g, Oral, TID WC, Maxie Barb, MD, 1.6 g at 05/15/20 0747 .  silver nitrate applicators applicator 1 application, 1 application, Topical, PRN, Lorin Glass, MD .  sodium chloride flush (NS) 0.9 % injection 10-40 mL, 10-40 mL, Intracatheter, Q12H, Hoffman, Erik C, DO, 10 mL at 05/15/20 1048 .  sodium chloride flush (NS) 0.9 % injection 10-40 mL, 10-40 mL, Intracatheter, PRN, Gust Rung, DO, 10 mL at 03/31/20 1130   Patients Current Diet:     Diet Order                      Diet - low sodium heart healthy              Diet Carb Modified Fluid consistency: Thin; Room service appropriate? Yes with Assist  Diet effective now                      Precautions / Restrictions Precautions Precautions: Fall Precaution Comments: trach, sacral wound Other Brace: bil prevalons Restrictions Weight Bearing Restrictions: No    Has the patient had 2 or more falls or a fall with injury in the past year? No   Prior Activity Level Community (5-7x/wk): Independent; drove, disabled   Prior Functional Level Self Care: Did the patient need help bathing, dressing, using the toilet or eating? Independent   Indoor Mobility: Did the patient need assistance with walking from room to room (with or without device)? Independent   Stairs: Did the patient need assistance with internal or external stairs (with or without device)? Independent   Functional Cognition: Did the patient need help planning regular tasks such as shopping or remembering to take medications? Independent   Home Assistive Devices / Equipment Home Assistive Devices/Equipment: None Home Equipment: None   Prior Device Use: Indicate devices/aids used by the patient prior to current illness, exacerbation or injury? None of the above   Current Functional  Level Cognition   Overall Cognitive Status: Within Functional Limits for tasks assessed Difficult to assess due to: Tracheostomy Current Attention Level: Selective Orientation Level: Oriented X4 Following Commands: Follows one  step commands inconsistently,Follows one step commands with increased time Safety/Judgement: Decreased awareness of deficits,Decreased awareness of safety General Comments: Not specifically tested, conversation was normal and processing speed/sequencing seemed intact.    Extremity Assessment (includes Sensation/Coordination)   Upper Extremity Assessment: Generalized weakness (fine motor coordination not assessed this session) RUE Deficits / Details: able to maintain grasp on yonker and tooth brush (sponge) attached to suction;demonstrated poor coordination with brushing motions RUE Coordination: decreased fine motor,decreased gross motor LUE Deficits / Details: grossly 3-/5 throughout; initially resistive to some movements but able to progress through full PROM  LUE Coordination: decreased fine motor,decreased gross motor  Lower Extremity Assessment: Defer to PT evaluation RLE Deficits / Details: pt with 2/5 in bil toes and ankles, seems stronger on her left or more rigid from being tight, often can resist my ROM, but unable to preform some MMT when asked.  LLE Deficits / Details: left leg either tighter or stronger than R leg, resisted Knee flexion and ankle PF likely due to pain.      ADLs   Overall ADL's : Needs assistance/impaired Eating/Feeding: Set up,Sitting Eating/Feeding Details (indicate cue type and reason): likely would need supported seating for extended eating/drinking session Grooming: Set up,Minimal assistance,Sitting Grooming Details (indicate cue type and reason): setup in supported seating, likely min A for full grooming session in unsupported sitting Upper Body Bathing: Moderate assistance Upper Body Bathing Details (indicate cue type and  reason): pt applied lotion to arms at bed level Lower Body Bathing: Maximal assistance Lower Body Bathing Details (indicate cue type and reason): pt applied lotion to LLE (hip to knee) while in sidelying position Upper Body Dressing : Maximal assistance,Sitting Lower Body Dressing: Total assistance,+2 for physical assistance,+2 for safety/equipment Lower Body Dressing Details (indicate cue type and reason): total assist to don socks  Toilet Transfer: Moderate assistance,+2 for physical assistance,+2 for safety/equipment (sara stedy) Toilet Transfer Details (indicate cue type and reason): simulated with EOB to recliner. Utilized sara stedy with pt able to come to full standing position 2x with mod A +2  Fatigued afterwards but in good spirits sitting up in recliner for the first time in several weeks!! Toileting - Clothing Manipulation Details (indicate cue type and reason): did not assess General ADL Comments: bed mobility completed then EOB to recliner utilizing      Mobility   Overal bed mobility: Needs Assistance Bed Mobility: Supine to Sit Rolling: Supervision Sidelying to sit: Min guard Supine to sit: Supervision Sit to supine: Min guard Sit to sidelying: Min guard General bed mobility comments: supervision for safety     Transfers   Overall transfer level: Needs assistance Equipment used: Rolling walker (2 wheeled) Transfer via Lift Equipment: Stedy Transfers: Sit to/from [employer] to Stand: Min guard Stand pivot transfers: Min assist General transfer comment: 6x sit to stasnd for ambulation (sat impulsively 2x without breaks being locked), 1x sit to stand to side step towards [employer]     Ambulation / Gait / Stairs / Wheelchair Mobility   Ambulation/Gait Ambulation/Gait assistance: Counsellor (Feet): 20 Feet Assistive device: Rolling walker (2 wheeled) Gait Pattern/deviations: Trunk flexed,Shuffle,Step-to pattern,Decreased stride length General Gait Details: 5', 8',  5', 20', 20', 10' gait trials with seat rest breaks for about 30 seconds between each trial and close chair follow; cues needed to stand up staight as pt fatigued Gait velocity interpretation: <1.31 ft/sec, indicative of household ambulator     Posture / Balance Dynamic Sitting Balance Sitting balance - Comments: reliant  on bil UE support for pressure relief from sacral wound Balance Overall balance assessment: Needs assistance Sitting-balance support: Feet supported,Bilateral upper extremity supported Sitting balance-Leahy Scale: Poor Sitting balance - Comments: reliant on bil UE support for pressure relief from sacral wound Postural control: Right lateral lean Standing balance support: Bilateral upper extremity supported Standing balance-Leahy Scale: Poor Standing balance comment: reliant on bil UE support on RW to maintain static and dynamic standing as well as energy conservation     Special needs/care consideration Size wise overlay mattress Trach Shiley XLT proximal 6 uncuffed capped on 12/14 with plans to de cannulate ESRD dialysis T TH SAT Incontinent of urine infrequently per patient. Continent of BM without contamination of wound bed. [place] nurse consulted on 10/25 Currently BID dressing change with Dakins dressing per [employer] on 12/13 and wound VAC discontinued. Also followed by [place] surgery. May resume MWF VAC change on 12/20. VAC was not sealing well with drainage. Please refer to photos in acute chart from 05/14/20.    Previous Home Environment  Living Arrangements: Alone (son lived with her and worked during the week; went to his home on the weekends per patient)  Lives With: Alone,Son Available Help at Discharge: Family,Available 24 hours/day (Tiona and siblings to provide 24/7 care at d/c as discussed) Type of Home: Apartment Home Layout: Two level,Bed/bath upstairs,1/2 bath on main level Home Access: Level entry Bathroom Shower/Tub: Chiropodist:  Standard Bathroom Accessibility: Yes How Accessible: Accessible via walker Home Care Services: Yes Type of Home Care Services: Homehealth aide (daughter, Barnie Alderman, was patient's CAPS aide pta) Additional Comments: information gathered in chart   Discharge Living Setting Plans for Discharge Living Setting: Patient's home,Alone,Apartment (son lives wiht her during the week and works) Type of Home at Discharge: Apartment Discharge Home Layout: Two level,1/2 bath on main level,Bed/bath upstairs Alternate Level Stairs-Number of Steps: 12 Discharge Home Access: Level entry Discharge Bathroom Shower/Tub: Tub/shower unit Discharge Bathroom Toilet: Standard Discharge Bathroom Accessibility: Yes How Accessible: Accessible via walker Does the patient have any problems obtaining your medications?: No   Social/Family/Support Systems Contact Information: Tiona , daughter, patient requests to be main contact of her children Anticipated Caregiver: Barnie Alderman and Jamas Lav and other family members Anticipated Ambulance person Information: see above Ability/Limitations of Caregiver: Barnie Alderman was her CAPS aide pta; daughters concerned about caring for her wound at discharge Caregiver Availability: 24/7 Discharge Plan Discussed with Primary Caregiver: Yes Is Caregiver In Agreement with Plan?: Yes Does Caregiver/Family have Issues with Lodging/Transportation while Pt is in Rehab?: No   Goals Patient/Family Goal for Rehab: Mod I to supervision with PT, min asisst OT and mod I with SLP Expected length of stay: ELOS 2 to 3 weeks Additional Information: ESRD with outpatient dialysis T TH SAT Pt/Family Agrees to Admission and willing to participate: Yes Program Orientation Provided & Reviewed with Pt/Caregiver Including Roles  & Responsibilities: Yes Additional Information Needs: Daughters have hesitancy about caring for wound; Patient will need to be able to sit for outpatient dialysis 5 to 6 hours to include  transport time  Barriers to Discharge: Other (comments) (ability to tolerate sitting for 5 to 6 hours for outpatient dialysis and children's ability to care for wound)   Decrease burden of Care through IP rehab admission: n/a   Possible need for SNF placement upon discharge: Tiona and [name] talk about possible SNF after CIR, But patient states she will not go to SNF, I have spoken with [facility] leadership about need for assistance for SNF  placement if needed.   Patient Condition: I have reviewed medical records from [facility] , spoken with  patient and daughter. I met with patient at the bedside for inpatient rehabilitation assessment.  Patient will benefit from ongoing PT, OT and SLP, can actively participate in 3 hours of therapy a day 5 days of the week, and can make measurable gains during the admission.  Patient will also benefit from the coordinated team approach during an Inpatient Acute Rehabilitation admission.  The patient will receive intensive therapy as well as Rehabilitation physician, nursing, social worker, and care management interventions.  Due to bladder management, bowel management, safety, skin/wound care, disease management, medication administration, pain management and patient education the patient requires 24 hour a day rehabilitation nursing.  The patient is currently min to mod assist overall with mobility and basic ADLs.  Discharge setting and therapy post discharge at home with home health is anticipated.  Patient has agreed to participate in the Acute Inpatient Rehabilitation Program and will admit today.   Preadmission Screen Completed By:  Cleatrice Burke, 05/15/2020 11:11 AM ______________________________________________________________________   Discussed status with Dr. Naaman Plummer  on  05/15/2020 at 1138 and received approval for admission today.   Admission Coordinator:  Cleatrice Burke, RN, time [id number] Date  12/14 /2021     Assessment/Plan: Diagnosis: debility after COVID 1. Does the need for close, 24 hr/day Medical supervision in concert with the patient's rehab needs make it unreasonable for this patient to be served in a less intensive setting? Yes 2. Co-Morbidities requiring supervision/potential complications: large decubitus wound, ESRD on HD, morbid obesity, DM, OSA on CPAP 3. Due to bladder management, bowel management, safety, skin/wound care, disease management, medication administration, pain management and patient education, does the patient require 24 hr/day rehab nursing? Yes 4. Does the patient require coordinated care of a physician, rehab nurse, PT, OT, and SLP to address physical and functional deficits in the context of the above medical diagnosis(es)? Yes Addressing deficits in the following areas: balance, endurance, locomotion, strength, transferring, bowel/bladder control, bathing, dressing, feeding, grooming, toileting and psychosocial support 5. Can the patient actively participate in an intensive therapy program of at least 3 hrs of therapy 5 days a week? Yes 6. The potential for patient to make measurable gains while on inpatient rehab is excellent 7. Anticipated functional outcomes upon discharge from inpatient rehab: modified independent and supervision PT, min assist OT, modified independent SLP 8. Estimated rehab length of stay to reach the above functional goals is: 14-21 days 9. Anticipated discharge destination: Home 10. Overall Rehab/Functional Prognosis: excellent     MD Signature: Meredith Staggers, MD, [place] [facility] 05/15/2020           Revision History                        Note Details  Author Meredith Staggers, MD File Time 05/15/2020 12:57 PM  Author Type Physician Status Signed  Last Editor Meredith Staggers, MD Service Physical Medicine and Rehabilitation

## 2020-05-15 NOTE — Progress Notes (Signed)
Patient arrived at approximately 1645 sad to be leaving the floor she had been on but was encouraged by previous staff who brought her to the unit and got along well with staff from this unit. Patient alert and oriented x4 with good communication skills.

## 2020-05-15 NOTE — Progress Notes (Signed)
Inpatient Rehabilitation Medication Review by a Pharmacist  A complete drug regimen review was completed for this patient to identify any potential clinically significant medication issues.  Clinically significant medication issues were identified:  no  Check AMION for pharmacist assigned to patient if future medication questions/issues arise during this admission.  Pharmacist comments: Given the patient's extended inpatient stay of ~3 months, most of her PTA medications (from pre-September) have not been required for management during her inpatient stay and therefore have been discontinued (I.e lyrica, auryxia, sensipar, victoza, glimepiride, toprol) and she has been maintained on alternatives (renvela, lantus + SSI, midodrine). Medications from pre-September that have been deemed to still be required (eye drops, synthroid, etc.) have been ordered along with medications from her last inpatient stay that she had been stable and maintained on. Though her med review was complex - there were no major interventions identified.   Time spent performing this drug regimen review (minutes):  35  Thank you for allowing pharmacy to be a part of this patient's care.  Alycia Rossetti, PharmD, BCPS Clinical Pharmacist 05/15/2020 6:47 PM   **Pharmacist phone directory can now be found on [email or web address] ([email or web address]).  Listed under [place].

## 2020-05-15 NOTE — TOC Transition Note (Signed)
Transition of Care [facility]) - CM/SW Discharge Note   Patient Details  Name: Wanda Ramirez MRN: [id number] Date of Birth: 1970-06-08  [facility] CM/SW Contact:  Angelita Ingles, RN Phone Number: [phone]  05/15/2020, 4:31 PM   Clinical Narrative:    Patient transferred to CIR     Barriers to Discharge: Continued Medical Work up   Patient Goals and CMS Choice        Discharge Placement                       Discharge Plan and Services   Discharge Planning Services: CM Consult                                 Social Determinants of Health (SDOH) Interventions     Readmission Risk Interventions Readmission Risk Prevention Plan 05/01/2020  Transportation Screening Complete  Medication Review ([place]) Complete  PCP or Specialist appointment within 3-5 days of discharge Complete  HRI or Home Care Consult Complete  SW Recovery Care/Counseling Consult Complete  Palliative Care Screening Not [place] Complete  Some recent data might be hidden

## 2020-05-15 NOTE — Progress Notes (Signed)
05/15/2020   I have seen and evaluated the patient for prolonged respiratory failure.  S:  Did great with capping. Denies pain or SOB.  O: Blood pressure 111/73, pulse 99, temperature 98.6 F (37 C), temperature source Oral, resp. rate 17, height 5\' 2"  (1.575 m), weight 100 kg, SpO2 98 %.  No acute distress lying in bed Trach in place capped Lung sounds clear, no accessory muscle use Heart sounds regular, ext warm Ext with minimal edema  A:  Prolonged respiratory failure after COVID pneumonia and HCAP complicated by weakness and relative malnutrition all of which is improving.   P:  - Decannulate today, dressing changes daily to trach - If stoma does not close within 4 weeks, would refer to ENT to consider surgical closure - PCCM available PRN, glad Ms. Tarpley is doing so well  05/15/2020 [name] MD

## 2020-05-15 NOTE — H&P (Signed)
Physical Medicine and Rehabilitation Admission H&P    Chief Complaint  Patient presents with  . Functional deficits due to debility    HPI:  Wanda Ramirez is a 49 year old female with history of T2DM, ESRD-HD TTS, morbid obesity, chronic respiratory failure--3 L oxygen dependent who was admitted on 02/13/20 with acute on chronic respiratory failure due to Covid 19 PNA. She was treated with remdesiver, steroids, tocilizumab X 1 as well as broad spectrum antibiotics but developed lethargy with increase in oxygen needs due to ARDS and was intubated 09/26. V/Q scan negative for PE. Nephrology following for management of HD and required CRRT due to hypotension.  She has had issues with LUA AVF clotting requring TDC X 2 (last by Dr. Anselm Pancoast on 09/28). She developed thrombocytopenia --HIT panel negative and was kept fully anticoagulated with IV heparin due to RV dysfunction and elevated D dimer. She was extubated on 10/06 but developed stridor with increased WOB and fever T-103 due to MSSA PNA requiring reintubation and ultimately required  tracheostomy 10/18.   She was on tube feeds for nutritional support but developed diarrhea requiring flexiseal. She developed large stage IV left buttock/sacral wound with exposed sacrum and bloody drainage with anemia and fevers. She was taken to OR for I & D 11/02 by Dr. Bobbye Morton (4 units PRBC/500 cc albumin administered perioperatively). Wound cultures positive for enteroccocus faecalis and ESBL E coli and treated with Meropenum She continued to have loose stools with oozing requiring frequent dressing changes. Hydrotherapy ongoing and wound VAC placed briefly but wound started developing significant  "C-diff type" odor with thick yellow drainage on 12/01. Wound VAC was removed with dakins used for local care and as drainage resolved replaced 12/06 but VAC not maintaining seal due to significant amount of wound drainage. Decision made by WOC/CCS to discontinue VAC on  12/13 with moist damp to dressing changes for now. CCS recommends replacing wound VAC if drainage resolves --in approx a week or so?    Issues with delirium/metabolic encephalopathy has resolved. She was extubated to BIPAP, tolerated capping and to be decannulated today. Dr. Tamala Julian recommends ENT referral if stoma does not close in 4 weeks. Hypotension being managed with midodrine on board and anemia of chronic illness being treated with aranesp --IV iron on hold due to infection. She is tolerating regular diet and showing improvement in activity tolerance. Therapy ongoing and CIR recommended due to functional decline.     Review of Systems  Constitutional: Negative for chills and fever.  HENT: Negative for hearing loss and tinnitus.   Eyes: Positive for blurred vision. Negative for double vision.  Respiratory: Negative for cough and shortness of breath.   Cardiovascular: Negative for chest pain.  Gastrointestinal: Negative for abdominal pain, diarrhea and heartburn.  Musculoskeletal: Positive for myalgias.       Sacral pain due to decub  Skin: Negative for itching and rash.  Neurological: Positive for sensory change (numbness right hand and left foot--new since hospitalization). Negative for dizziness and headaches.  Psychiatric/Behavioral: The patient is not nervous/anxious and does not have insomnia.       Past Medical History:  Diagnosis Date  . Arthritis   . Asthma   . Chronic back pain   . Diabetes mellitus ([place])   . ESRD (end stage renal disease) on dialysis (HCC)    TTS  . Essential hypertension   . GERD (gastroesophageal reflux disease)   . Glaucoma   . Hyperlipidemia   .  Hypothyroidism   . Morbid obesity ([place])   . Peripheral neuropathy   . Sleep apnea    wears CPAP, does not know setting    Past Surgical History:  Procedure Laterality Date  . AV FISTULA PLACEMENT Left 04/2014  . CARDIAC CATHETERIZATION N/A 03/17/2016   Procedure: Left Heart Cath and Coronary  Angiography;  Surgeon: [name], MD;  Location: [facility];  Service: Cardiovascular;  Laterality: N/A;  . INCISION AND DRAINAGE PERIRECTAL ABSCESS N/A 04/03/2020   Procedure: EXCISIONAL DEBRIDEMENT OF SACRAL AND GLUTEAL WOUNDS;  Surgeon: [name], MD;  Location: [facility];  Service: General;  Laterality: N/A;  . IR FLUORO GUIDE CV LINE LEFT  02/22/2020  . IR FLUORO GUIDE CV LINE LEFT  02/27/2020  . IR US GUIDE VASC ACCESS LEFT  02/22/2020  . KNEE SURGERY    . TUBAL LIGATION      Family History  Problem Relation Age of Onset  . Diabetes Mother   . Hyperlipidemia Mother   . Kidney disease Father     Social History:  reports that she has quit smoking. She has never used smokeless tobacco. She reports that she does not drink alcohol and does not use drugs.   Allergies  Allergen Reactions  . Hydrocodone Itching and Nausea And Vomiting   Medications Prior to Admission  Medication Sig Dispense Refill  . acetaminophen (TYLENOL) 325 MG tablet Take 650 mg by mouth every 6 (six) hours as needed for mild pain.    Marland Kitchen albuterol (PROVENTIL HFA;VENTOLIN HFA) 108 (90 Base) MCG/ACT inhaler Inhale 2 puffs into the lungs every 6 (six) hours as needed for wheezing or shortness of breath.    Marland Kitchen amLODipine (NORVASC) 10 MG tablet Take 10 mg by mouth at bedtime.    Marland Kitchen aspirin EC 81 MG tablet Take 81 mg by mouth daily.    Lorin Picket 1 GM 210 MG(Fe) tablet Take 210 mg by mouth 3 (three) times daily with meals.   3  . benzonatate (TESSALON) 100 MG capsule Take 100 mg by mouth as needed for cough.     . Brinzolamide-Brimonidine (SIMBRINZA) 1-0.2 % SUSP Place 2 drops into both eyes 3 (three) times daily.     . calcium acetate (PHOSLO) 667 MG capsule Take 1,334 mg by mouth 3 (three) times daily.  6  . cinacalcet (SENSIPAR) 90 MG tablet Take 90 mg by mouth daily.     Marland Kitchen glimepiride (AMARYL) 2 MG tablet Take 2 mg by mouth daily with supper.    Marland Kitchen glimepiride (AMARYL) 4 MG tablet Take 4 mg by  mouth daily with breakfast.    . insulin regular human CONCENTRATED (HUMULIN R) 500 UNIT/ML injection Inject 3-6 Units into the skin 2 (two) times daily with a meal. Takes 3 units QAM and 6 units QHS    . levothyroxine (SYNTHROID, LEVOTHROID) 125 MCG tablet Take 125 mcg by mouth daily before breakfast.    . liraglutide (VICTOZA) 18 MG/3ML SOPN Inject 1.8 mg into the skin daily.    Marland Kitchen loratadine (CLARITIN) 10 MG tablet Take 10 mg by mouth daily.    . metoprolol succinate (TOPROL-XL) 50 MG 24 hr tablet Take 50 mg by mouth 2 (two) times daily. Take with or immediately following a meal.     . Netarsudil Dimesylate (RHOPRESSA) 0.02 % SOLN Place 1 drop into both eyes daily.     Marland Kitchen omeprazole (PRILOSEC) 40 MG capsule Take 40 mg by mouth daily.    Marland Kitchen  ondansetron (ZOFRAN) 4 MG tablet Take 4 mg by mouth daily.    . Oxycodone HCl 10 MG TABS Take 10 mg by mouth 4 (four) times daily as needed for pain.  0  . pregabalin (LYRICA) 75 MG capsule Take 1 capsule (75 mg total) by mouth daily. Take an additional 75mg  on Dialysis days    . tiotropium (SPIRIVA) 18 MCG inhalation capsule Place 18 mcg into inhaler and inhale daily.    . Travoprost, BAK Free, (TRAVATAN) 0.004 % SOLN ophthalmic solution Place 1 drop into both eyes every morning.    . famotidine (PEPCID) 40 MG tablet Take 40 mg by mouth daily.      Drug Regimen Review  Drug regimen was reviewed and remains appropriate with no significant issues identified  Home: Home Living Family/patient expects to be discharged to:: Skilled nursing facility Living Arrangements: Children Available Help at Discharge: Family,Available PRN/intermittently Type of Home: Apartment Home Access: Level entry Home Layout: Two level,Bed/bath upstairs,1/2 bath on main level Bathroom Shower/Tub: Chiropodist: Standard Home Equipment: None Additional Comments: information gathered in chart   Functional History: Prior Function Level of Independence:  Independent Comments: pt normally cares for herself and drives to/from HD. Pt lives with son who works and has grandchildren  Functional Status:  Mobility: Bed Mobility Overal bed mobility: Needs Assistance Bed Mobility: Supine to Sit Rolling: Supervision Sidelying to sit: Min guard Supine to sit: Supervision Sit to supine: Min guard Sit to sidelying: Min guard General bed mobility comments: supervision for safety Transfers Overall transfer level: Needs assistance Equipment used: Rolling walker (2 wheeled) Transfer via Lift Equipment: Stedy Transfers: Sit to/from [employer] to Stand: Min guard Stand pivot transfers: Min assist General transfer comment: 6x sit to stasnd for ambulation (sat impulsively 2x without breaks being locked), 1x sit to stand to side step towards [facility] Ambulation/Gait Ambulation/Gait assistance: Min guard Gait Distance (Feet): 20 Feet Assistive device: Rolling walker (2 wheeled) Gait Pattern/deviations: Trunk flexed,Shuffle,Step-to pattern,Decreased stride length General Gait Details: 5', 8', 5', 20', 20', 10' gait trials with seat rest breaks for about 30 seconds between each trial and close chair follow; cues needed to stand up staight as pt fatigued Gait velocity interpretation: <1.31 ft/sec, indicative of household ambulator    ADL: ADL Overall ADL's : Needs assistance/impaired Eating/Feeding: Set up,Sitting Eating/Feeding Details (indicate cue type and reason): likely would need supported seating for extended eating/drinking session Grooming: Set up,Minimal assistance,Sitting Grooming Details (indicate cue type and reason): setup in supported seating, likely min A for full grooming session in unsupported sitting Upper Body Bathing: Moderate assistance Upper Body Bathing Details (indicate cue type and reason): pt applied lotion to arms at bed level Lower Body Bathing: Maximal assistance Lower Body Bathing Details (indicate cue type and reason): pt  applied lotion to LLE (hip to knee) while in sidelying position Upper Body Dressing : Maximal assistance,Sitting Lower Body Dressing: Total assistance,+2 for physical assistance,+2 for safety/equipment Lower Body Dressing Details (indicate cue type and reason): total assist to don socks  Toilet Transfer: Moderate assistance,+2 for physical assistance,+2 for safety/equipment (sara stedy) Toilet Transfer Details (indicate cue type and reason): simulated with EOB to recliner. Utilized sara stedy with pt able to come to full standing position 2x with mod A +2  Fatigued afterwards but in good spirits sitting up in recliner for the first time in several weeks!! Toileting - Clothing Manipulation Details (indicate cue type and reason): did not assess General ADL Comments: bed mobility completed then EOB  to recliner utilizing   Cognition: Cognition Overall Cognitive Status: Within Functional Limits for tasks assessed Orientation Level: Oriented X4 Cognition Arousal/Alertness: Awake/alert Behavior During Therapy: WFL for tasks assessed/performed Overall Cognitive Status: Within Functional Limits for tasks assessed Area of Impairment: Attention,Memory,Following commands,Safety/judgement,Awareness,Problem solving Orientation Level: Disoriented to,Place (got month and year correct) Current Attention Level: Selective Memory: Decreased short-term memory Following Commands: Follows one step commands inconsistently,Follows one step commands with increased time Safety/Judgement: Decreased awareness of deficits,Decreased awareness of safety Awareness: Emergent Problem Solving: Slow processing,Requires verbal cues General Comments: Not specifically tested, conversation was normal and processing speed/sequencing seemed intact. Difficult to assess due to: Tracheostomy   Blood pressure 111/65, pulse (!) 104, temperature 98.5 F (36.9 C), temperature source Oral, resp. rate 20, height 5\' 2"  (1.575 m), weight  100 kg, SpO2 99 %. Physical Exam Vitals and nursing note reviewed.  Constitutional:      Appearance: Normal appearance. She is obese.  HENT:     Head: Normocephalic.     Nose: Nose normal.  Eyes:     Extraocular Movements: Extraocular movements intact.     Pupils: Pupils are equal, round, and reactive to light.  Cardiovascular:     Rate and Rhythm: Regular rhythm. Tachycardia present.  Pulmonary:     Effort: Pulmonary effort is normal.  Abdominal:     General: Bowel sounds are normal.     Palpations: Abdomen is soft.  Musculoskeletal:        General: No swelling.     Cervical back: Normal range of motion.  Skin:    Comments: Large sacral wound with serosanguinous drainage, dressing which appears to be granulating in, some exposed fat, small amount (10-15%) remaining fibronecrotic tissue  Trach stoma dressed. Some air leakage  Neurological:     Mental Status: She is alert and oriented to person, place, and time.     Sensory: No sensory deficit.     Comments: Mild dysphonia with some air escape from stoma. Otherwise normal CN exam. Demonstrates reasonable insight and awareness. Normal language. UE motor 4- to 4/5 prox to distal. LE 3+ HF to 4/5 ADF/PF with some pain inhibition d/t wound, current positioning  Psychiatric:        Mood and Affect: Mood normal.        Behavior: Behavior normal.     Results for orders placed or performed during the hospital encounter of 02/13/20 (from the past 48 hour(s))  Glucose, capillary     Status: Abnormal   Collection Time: 05/13/20 12:23 PM  Result Value Ref Range   Glucose-Capillary 190 (H) 70 - 99 mg/dL    Comment: Glucose reference range applies only to samples taken after fasting for at least 8 hours.  Glucose, capillary     Status: Abnormal   Collection Time: 05/13/20  5:18 PM  Result Value Ref Range   Glucose-Capillary 251 (H) 70 - 99 mg/dL    Comment: Glucose reference range applies only to samples taken after fasting for at least  8 hours.  Glucose, capillary     Status: Abnormal   Collection Time: 05/13/20  6:22 PM  Result Value Ref Range   Glucose-Capillary 245 (H) 70 - 99 mg/dL    Comment: Glucose reference range applies only to samples taken after fasting for at least 8 hours.  Glucose, capillary     Status: Abnormal   Collection Time: 05/13/20  8:51 PM  Result Value Ref Range   Glucose-Capillary 189 (H) 70 - 99 mg/dL  Comment: Glucose reference range applies only to samples taken after fasting for at least 8 hours.  Glucose, capillary     Status: Abnormal   Collection Time: 05/13/20 11:35 PM  Result Value Ref Range   Glucose-Capillary 149 (H) 70 - 99 mg/dL    Comment: Glucose reference range applies only to samples taken after fasting for at least 8 hours.  Renal function panel     Status: Abnormal   Collection Time: 05/14/20  3:46 AM  Result Value Ref Range   Sodium 135 135 - 145 mmol/L   Potassium 5.1 3.5 - 5.1 mmol/L   Chloride 97 (L) 98 - 111 mmol/L   CO2 23 22 - 32 mmol/L   Glucose, Bld 208 (H) 70 - 99 mg/dL    Comment: Glucose reference range applies only to samples taken after fasting for at least 8 hours.   BUN 63 (H) 6 - 20 mg/dL   Creatinine, Ser 5.84 (H) 0.44 - 1.00 mg/dL    Comment: DIALYSIS   Calcium 10.4 (H) 8.9 - 10.3 mg/dL   Phosphorus 5.8 (H) 2.5 - 4.6 mg/dL   Albumin 2.1 (L) 3.5 - 5.0 g/dL   GFR, Estimated 8 (L) >60 mL/min    Comment: (NOTE) Calculated using the CKD-EPI Creatinine Equation (2021)    Anion gap 15 5 - 15    Comment: Performed at [facility] [address], [place], [place] [address]  Glucose, capillary     Status: Abnormal   Collection Time: 05/14/20  3:55 AM  Result Value Ref Range   Glucose-Capillary 204 (H) 70 - 99 mg/dL    Comment: Glucose reference range applies only to samples taken after fasting for at least 8 hours.  Glucose, capillary     Status: None   Collection Time: 05/14/20  7:37 AM  Result Value Ref Range   Glucose-Capillary 90 70 - 99  mg/dL    Comment: Glucose reference range applies only to samples taken after fasting for at least 8 hours.  Glucose, capillary     Status: Abnormal   Collection Time: 05/14/20 12:03 PM  Result Value Ref Range   Glucose-Capillary 142 (H) 70 - 99 mg/dL    Comment: Glucose reference range applies only to samples taken after fasting for at least 8 hours.  Glucose, capillary     Status: Abnormal   Collection Time: 05/14/20  4:07 PM  Result Value Ref Range   Glucose-Capillary 210 (H) 70 - 99 mg/dL    Comment: Glucose reference range applies only to samples taken after fasting for at least 8 hours.  Glucose, capillary     Status: Abnormal   Collection Time: 05/14/20  7:04 PM  Result Value Ref Range   Glucose-Capillary 161 (H) 70 - 99 mg/dL    Comment: Glucose reference range applies only to samples taken after fasting for at least 8 hours.  Glucose, capillary     Status: Abnormal   Collection Time: 05/15/20 12:15 AM  Result Value Ref Range   Glucose-Capillary 129 (H) 70 - 99 mg/dL    Comment: Glucose reference range applies only to samples taken after fasting for at least 8 hours.  Renal function panel     Status: Abnormal   Collection Time: 05/15/20  3:29 AM  Result Value Ref Range   Sodium 137 135 - 145 mmol/L   Potassium 5.9 (H) 3.5 - 5.1 mmol/L   Chloride 100 98 - 111 mmol/L   CO2 22 22 - 32  mmol/L   Glucose, Bld 115 (H) 70 - 99 mg/dL    Comment: Glucose reference range applies only to samples taken after fasting for at least 8 hours.   BUN 90 (H) 6 - 20 mg/dL   Creatinine, Ser 7.41 (H) 0.44 - 1.00 mg/dL   Calcium 10.4 (H) 8.9 - 10.3 mg/dL   Phosphorus 6.0 (H) 2.5 - 4.6 mg/dL   Albumin 2.2 (L) 3.5 - 5.0 g/dL   GFR, Estimated 6 (L) >60 mL/min    Comment: (NOTE) Calculated using the CKD-EPI Creatinine Equation (2021)    Anion gap 15 5 - 15    Comment: Performed at [facility] [address], [place], [place] [address]  Glucose, capillary     Status: Abnormal    Collection Time: 05/15/20  3:56 AM  Result Value Ref Range   Glucose-Capillary 117 (H) 70 - 99 mg/dL    Comment: Glucose reference range applies only to samples taken after fasting for at least 8 hours.  Glucose, capillary     Status: Abnormal   Collection Time: 05/15/20  7:15 AM  Result Value Ref Range   Glucose-Capillary 141 (H) 70 - 99 mg/dL    Comment: Glucose reference range applies only to samples taken after fasting for at least 8 hours.   No results found.     Medical Problem List and Plan: 1.  Functional and mobility deficits secondary to debility after COVID and associated complications including VDRF/trach and large sacral wound  -patient may not yet shower  -ELOS/Goals: 14-21 days, mod I PT, min assist OT, mod I SLP 2.  Antithrombotics: -DVT/anticoagulation:  Pharmaceutical: Heparin  -antiplatelet therapy: N/A 3. Pain Management: Oxycodone prn.  4. Mood: LCSW to follow for evaluation and support.   -antipsychotic agents: N/A 5. Neuropsych: This patient is capable of making decisions on her own behalf--dicussed code status -->SHE WANTS TO BE FULL CODE . 6.Large stage IV sacral/buttock wound/Wound Care: Meropenum completed 05/13/20.  -Juven tid, glucerna tid and vitamin B w/C.    -Santyl to eschar on inferior flap and wet to dry dressing changes bid and prn to keep wound clean of stool to prevent contamination.   -Pressure relief measure with air mattress.     -WBC trending upwards--recheck in am  7. Fluids/Electrolytes/Nutrition: Renal diet with 1200 cc FR. Strict I/O with daily weights.   -check labs with HD 8.T2DM: On glucerna tid with meals. Monitor BS ac/hs--On Lantus 3 units daily with SSI for elevated BS.  9. Hypotension: Monitor BP tid--especially with increase in activity. Continue Midodrine 10 mg tid.  Hypthyroid: Stable on supplement.  10. ESRD:  HD -TTS with nephrology to assist with management. Schedule HD at the end of the day to help with tolerance of  activity. Sevelmar tid for metabolic bone disease. Hyperkalemia managed with HD. 11. Anemia of critical illness/anemia of CKD: On Aranesp 150 mc/week. Hgb stable at 7.2 range past week.  Transfuse prn Hgb<7.0.   12.  Anxiety: Continue low dose klonopin in am and Seroquel at nights to help with sleep.       Bary Leriche, PA-C 05/15/2020

## 2020-05-15 NOTE — Progress Notes (Addendum)
Inpatient Rehabilitation Admissions Coordinator  I have a CIR bed that I can admit patient to today. I met with patient at beside and she is in agreement. I will contact Attending service to make the arrangements. Acute team and TOC made aware. I will notify dialysis of plan.  Danne Baxter, RN, MSN Rehab Admissions Coordinator [phone] 05/15/2020 10:29 AM   Attending service have given ok to dc to Cir today. I contacted pt's daughter, Barnie Alderman, by phone and she is aware of planned admit to Cir today.  Danne Baxter, RN, MSN Rehab Admissions Coordinator [phone] 05/15/2020 10:44 AM

## 2020-05-16 ENCOUNTER — Inpatient Hospital Stay (HOSPITAL_COMMUNITY): Payer: Medicaid Other | Admitting: Speech Pathology

## 2020-05-16 ENCOUNTER — Inpatient Hospital Stay (HOSPITAL_COMMUNITY): Payer: Medicaid Other | Admitting: Occupational Therapy

## 2020-05-16 ENCOUNTER — Inpatient Hospital Stay (HOSPITAL_COMMUNITY): Payer: Medicaid Other | Admitting: Physical Therapy

## 2020-05-16 LAB — GLUCOSE, CAPILLARY
Glucose-Capillary: 120 mg/dL — ABNORMAL HIGH (ref 70–99)
Glucose-Capillary: 201 mg/dL — ABNORMAL HIGH (ref 70–99)
Glucose-Capillary: 158 mg/dL — ABNORMAL HIGH (ref 70–99)
Glucose-Capillary: 173 mg/dL — ABNORMAL HIGH (ref 70–99)

## 2020-05-16 MED ORDER — LIVING WELL WITH DIABETES BOOK
Freq: Once | Status: AC
  Filled 2020-05-16: qty 1

## 2020-05-16 MED ORDER — SEVELAMER CARBONATE 800 MG PO TABS
1600.0000 mg | ORAL_TABLET | Freq: Three times a day (TID) | ORAL | Status: DC
  Administered 2020-05-16 – 2020-05-30 (×41): 1600 mg via ORAL
  Filled 2020-05-16 (×43): qty 2

## 2020-05-16 NOTE — Progress Notes (Signed)
[facility] NEPHROLOGY PROGRESS NOTE  Assessment/ Plan: Pt is a 49 y.o. yo female  w/ ESRD on TTS HD admitted for COVID PNA on 02/13/20. She was intubated. SP CRRT around 9/26- 10/7. Now is on intermittent HD MWF here. She is sp trach. She had MSSA pna. DM2.Then she had large sacral decub requiring I&D x 2.    Physical Exam: General: trach out now Heart:RRR, s1s2 nl Lungs: Bibasal rhonchi and some coarse breath sound Abdomen:soft, Non-tender, non-distended Extremities: No edema Dialysis Access: Left IJ tunnel HD catheter   OP HD:TTS  4h 44mn 450/800 2/2.25 bath 111.5kg Hep 2000 TDC  - L AVF clotted while here, using TDC now  - darbe 50 ug q week, last 9/7 - calc 1.0 tiw - 9/9 Hb 10.0, tsat 22%  Problems: 1. COVID pna / sp trach / MSSA PNA - off vent on trach collar. Trach removed now.   2. Hypotension - BP's stable on midodrine 10 tid. Monitor BP. 3. Stage IV sacral decub - sp I&D x 2.  SP meropenem course. Wound care, general surgery is following, wound is improving.  4. ESRD - S/P CRRT, now HD TTS.  HD thursday.  5. Anemia ckd/ critical illness - on darbe 150 weekly, 11/12 Ferritin >4000, iron sat 11. With active infection and ^^ ferritin no IV iron. tx prbc's PRN. Rechecked iron panel-- Ferritin 2777 and %sat 18, hold off on IV iron for now. 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 Monitor 7. HD access: LUA AVF clotted here during acute covid. TDC x2 per IR 8. Dispo: now on CIR  RKelly Splinter MD 05/16/2020, 2:50 PM      Subjective: Seen and examined.  No c/o.    Objective Vital signs in last 24 hours: Vitals:   05/15/20 2016 05/16/20 0405 05/16/20 0517 05/16/20 1440  BP: 108/67  103/64 (!) 129/100  Pulse: 90 90 87 94  Resp: '17 18 17 17  ' Temp: 98.4 F (36.9 C)  98.4 F (36.9 C) 97.6 F (36.4 C)  TempSrc:   Oral Oral  SpO2: 98% 98% 100% 100%  Weight:      Height:       Weight change:    Intake/Output Summary (Last 24 hours) at 05/16/2020 1450 Last data filed at 05/16/2020 1300 Gross per 24 hour  Intake 640 ml  Output --  Net 640 ml       Labs: Basic Metabolic Panel: Recent Labs  Lab 05/13/20 0327 05/14/20 0346 05/15/20 0329  NA 135 135 137  K 4.4 5.1 5.9*  CL 98 97* 100  CO2 '24 23 22  ' GLUCOSE 124* 208* 115*  BUN 33* 63* 90*  CREATININE 3.71* 5.84* 7.41*  CALCIUM 10.1 10.4* 10.4*  PHOS 4.4 5.8* 6.0*   Liver Function Tests: Recent Labs  Lab 05/13/20 0327 05/14/20 0346 05/15/20 0329  ALBUMIN 2.2* 2.1* 2.2*   No results for input(s): LIPASE, AMYLASE in the last 168 hours. No results for input(s): AMMONIA in the last 168 hours. CBC: Recent Labs  Lab 05/10/20 0629 05/12/20 0754 05/15/20 1217  WBC 9.5 12.5* 13.6*  HGB 7.2* 7.2* 7.2*  HCT 23.6* 23.3* 23.3*  MCV 89.1 90.3 89.3  PLT 489* 449* 421*   Cardiac Enzymes: No results for input(s): CKTOTAL, CKMB, CKMBINDEX, TROPONINI in the last 168 hours. CBG: Recent Labs  Lab 05/15/20 0715 05/15/20 1610 05/15/20 2125 05/16/20 0528 05/16/20 1145  GLUCAP 141* 146* 206* 120* 201*  Iron Studies: No results for input(s): IRON, TIBC, TRANSFERRIN, FERRITIN in the last 72 hours. Studies/Results: No results found.  Medications: Infusions:   Scheduled Medications: . B-complex with vitamin C  1 tablet Oral Daily  . brimonidine  1 drop Both Eyes TID  . brinzolamide  1 drop Both Eyes TID  . chlorhexidine gluconate (MEDLINE KIT)  15 mL Mouth Rinse BID  . Chlorhexidine Gluconate Cloth  6 each Topical Daily  . clonazepam  0.25 mg Oral Daily  . collagenase   Topical Daily  . [START ON 05/17/2020] darbepoetin (ARANESP) injection - DIALYSIS  150 mcg Intravenous Q Thu-HD  . feeding supplement (GLUCERNA SHAKE)  237 mL Oral TID WC  . heparin  5,000 Units Subcutaneous Q8H  . hydrocerin   Topical Daily  . insulin aspart  0-6 Units Subcutaneous TID AC & HS  . insulin glargine  3 Units Subcutaneous  QHS  . latanoprost  1 drop Both Eyes q AM  . levothyroxine  125 mcg Oral Q0600  . midodrine  10 mg Oral TID WC  . multivitamin  1 tablet Oral QHS  . nutrition supplement (JUVEN)  1 packet Oral BID BM  . pantoprazole  40 mg Oral Daily  . QUEtiapine  50 mg Oral QHS  . sevelamer carbonate  1,600 mg Oral TID WC    have reviewed scheduled and prn medications.

## 2020-05-16 NOTE — Progress Notes (Addendum)
[facility] PHYSICAL MEDICINE & REHABILITATION PROGRESS NOTE   Subjective/Complaints:  No issues last noc, amb 63' with rolling walker with PT, this am.  HR up from 110->130 during amb , no O2 desat HD was yesterday   ROS- neg CP, SOB, N/V/D  Objective:   No results found. Recent Labs    05/15/20 1217  WBC 13.6*  HGB 7.2*  HCT 23.3*  PLT 421*   Recent Labs    05/14/20 0346 05/15/20 0329  NA 135 137  K 5.1 5.9*  CL 97* 100  CO2 23 22  GLUCOSE 208* 115*  BUN 63* 90*  CREATININE 5.84* 7.41*  CALCIUM 10.4* 10.4*    Intake/Output Summary (Last 24 hours) at 05/16/2020 0647 Last data filed at 05/16/2020 0554 Gross per 24 hour  Intake 240 ml  Output --  Net 240 ml     Pressure Injury 03/06/20 Sacrum Mid Stage 4 - Full thickness tissue loss with exposed bone, tendon or muscle. full thickness tissue loss. base of wound is red with big skin flap coverings as a lid. wound is tunneling at 3 o'clock, 7 o'clock, and 9 o'cloc (Active)  03/06/20 0000  Location: Sacrum  Location Orientation: Mid  Staging: Stage 4 - Full thickness tissue loss with exposed bone, tendon or muscle.  Wound Description (Comments): full thickness tissue loss. base of wound is red with big skin flap coverings as a lid. wound is tunneling at 3 o'clock, 7 o'clock, and 9 o'clock  Present on Admission: No     Pressure Injury 03/28/20 Neck Right;Lower Stage 2 -  Partial thickness loss of dermis presenting as a shallow open injury with a red, pink wound bed without slough. Beneath right side of tracheostomy (Active)  03/28/20 1800  Location: Neck  Location Orientation: Right;Lower  Staging: Stage 2 -  Partial thickness loss of dermis presenting as a shallow open injury with a red, pink wound bed without slough.  Wound Description (Comments): Beneath right side of tracheostomy  Present on Admission: No     Pressure Injury 05/15/20 Buttocks Left Stage 2 -  Partial thickness loss of dermis presenting as a  shallow open injury with a red, pink wound bed without slough. 2 shallow open areas on the left lateral buttock without slough 1 of 2 (Active)  05/15/20 1700  Location: Buttocks  Location Orientation: Left  Staging: Stage 2 -  Partial thickness loss of dermis presenting as a shallow open injury with a red, pink wound bed without slough.  Wound Description (Comments): 2 shallow open areas on the left lateral buttock without slough 1 of 2  Present on Admission: Yes     Pressure Injury 05/15/20 Buttocks Left Stage 2 -  Partial thickness loss of dermis presenting as a shallow open injury with a red, pink wound bed without slough. 2 shallow open areas on the left lateral buttock without slough 2 of 2 (Active)  05/15/20 1700  Location: Buttocks  Location Orientation: Left  Staging: Stage 2 -  Partial thickness loss of dermis presenting as a shallow open injury with a red, pink wound bed without slough.  Wound Description (Comments): 2 shallow open areas on the left lateral buttock without slough 2 of 2  Present on Admission: Yes    Physical Exam: Vital Signs Blood pressure 103/64, pulse 87, temperature 98.4 F (36.9 C), temperature source Oral, resp. rate 17, height 5\' 2"  (1.575 m), weight 89.7 kg, SpO2 100 %. HEENT: healed trach    General: No  acute distress Mood and affect are appropriate Heart: Regular rate and rhythm no rubs murmurs or extra sounds Lungs: Clear to auscultation, breathing unlabored, no rales or wheezes Abdomen: Positive bowel sounds, soft nontender to palpation, nondistended Extremities: No clubbing, cyanosis, or edema Skin: Sacral decubitus    Neurologic: Cranial nerves II through XII intact, motor strength is 4/5 in bilateral deltoid, bicep, tricep, grip, hip flexor, knee extensors, ankle dorsiflexor and plantar flexor Sensory exam able to sense LT but reports numbness RIght little finger and Left toes Cerebellar exam normal finger to nose to finger as well as  heel to shin in bilateral upper and lower extremities Musculoskeletal: Full range of motion in all 4 extremities. No joint swelling    Assessment/Plan: 1. Functional deficits which require 3+ hours per day of interdisciplinary therapy in a comprehensive inpatient rehab setting.  Physiatrist is providing close team supervision and 24 hour management of active medical problems listed below.  Physiatrist and rehab team continue to assess barriers to discharge/monitor patient progress toward functional and medical goals  Care Tool:  Bathing              Bathing assist       Upper Body Dressing/Undressing Upper body dressing        Upper body assist      Lower Body Dressing/Undressing Lower body dressing            Lower body assist       Toileting Toileting    Toileting assist Assist for toileting: Total Assistance - Patient < 25%     Transfers Chair/bed transfer  Transfers assist           Locomotion Ambulation   Ambulation assist              Walk 10 feet activity   Assist           Walk 50 feet activity   Assist           Walk 150 feet activity   Assist           Walk 10 feet on uneven surface  activity   Assist           Wheelchair     Assist               Wheelchair 50 feet with 2 turns activity    Assist            Wheelchair 150 feet activity     Assist          Blood pressure 103/64, pulse 87, temperature 98.4 F (36.9 C), temperature source Oral, resp. rate 17, height 5\' 2"  (1.575 m), weight 89.7 kg, SpO2 100 %.    Medical Problem List and Plan: 1.  Functional and mobility deficits secondary to debility after COVID and associated complications including VDRF/trach and large sacral wound Has neuropathy, mononeuropathy multiplex pattern, no sig neurogenic pain             -patient may not yet shower             -ELOS/Goals: 14-21 days, mod I PT, min assist OT, mod I  SLP 2.  Antithrombotics: -DVT/anticoagulation:  Pharmaceutical: Heparin             -antiplatelet therapy: N/A 3. Pain Management: Oxycodone prn.  4. Mood: LCSW to follow for evaluation and support.              -antipsychotic agents: N/A 5.  Neuropsych: This patient is capable of making decisions on her own behalf--dicussed code status -->SHE WANTS TO BE FULL CODE . 6.Large stage IV sacral/buttock wound/Wound Care: Meropenum completed 05/13/20.     -Juven tid, glucerna tid and vitamin B w/C.               -Santyl to eschar on inferior flap and wet to dry dressing changes bid and prn to keep wound clean of stool to prevent contamination.              -Pressure relief measure with air mattress.                -WBC trending upwards--recheck in am  7. Fluids/Electrolytes/Nutrition: Renal diet with 1200 cc FR. Strict I/O with daily weights.              -check labs with HD 8.T2DM: On glucerna tid with meals. Monitor BS ac/hs--On Lantus 3 units daily with SSI for elevated BS.  CBG (last 3)  Recent Labs    05/15/20 1610 05/15/20 2125 05/16/20 0528  GLUCAP 146* 206* 120*  Controlled 12/15  9. Hypotension: Monitor BP tid--especially with increase in activity. Continue Midodrine 10 mg tid.  Vitals:   05/16/20 0405 05/16/20 0517  BP:  103/64  Pulse: 90 87  Resp: 18 17  Temp:  98.4 F (36.9 C)  SpO2: 98% 100%  BP  alittle soft will cont to monitor on Midodrine, nephro adjusting HD  Hypthyroid: Stable on supplement.  10. ESRD:  HD -TTS with nephrology to assist with management. Schedule HD at the end of the day to help with tolerance of activity. Sevelmar tid for metabolic bone disease. Hyperkalemia managed with HD. 11. Anemia of critical illness/anemia of CKD: On Aranesp 150 mc/week. Hgb stable at 7.2 range past week.  Transfuse prn Hgb<7.0.   12.  Anxiety: Continue low dose klonopin in am and Seroquel at nights to help with sleep.    LOS: 1 days A FACE TO FACE EVALUATION WAS  PERFORMED  Charlett Blake 05/16/2020, 6:47 AM

## 2020-05-16 NOTE — Patient Care Conference (Signed)
Inpatient RehabilitationTeam Conference and Plan of Care Update Date: 05/16/2020   Time: 10:09 AM    Patient Name: Wanda Ramirez      Medical Record Number: [id number]  Date of Birth: [birth year] Sex: Female         Room/Bed: 4W05C/4W05C-01 Payor Info: Payor: MEDICAID [place] / Plan: MEDICAID [place] ACCESS / Product Type: *No Product type* /    Admit Date/Time:  05/15/2020  4:47 PM  Primary Diagnosis:  [facility] Problems: Principal Problem:   Debility Active Problems:   End stage renal disease on dialysis ([place])   COVID-19   Pressure injury of skin   Sacral wound    Expected Discharge Date: Expected Discharge Date:  (initial evals pending)  Team Members Present: Physician leading conference: Dr. Alysia Penna Care Coodinator Present: Dorien Chihuahua, RN, BSN, CRRN;Christina Sampson Goon, [place] Nurse Present: Other (comment) Tommie Sams, RN) PT Present: Page Spiro, PT OT Present: Mariane Masters, OT SLP Present: Jettie Booze, CF-SLP PPS Coordinator present : Gunnar Fusi, Novella Olive, PT     Current Status/Progress Goal Weekly Team Focus  Bowel/Bladder   Pt continent of B/B. Pt oliguric. LBM 05/15/2020  Pt to remain continent  Assess B/B every shift and PRN   Swallow/Nutrition/ Hydration             ADL's   MIN A overall for transfers and LB ADLs.  supervision  functional mobility, safety awareness, ADL retraining, functional transfers, pressure relief/skin integrity   Mobility             Communication   evalulation pending         Safety/Cognition/ Behavioral Observations  evaluation pending         Pain   Pt using oxycodone PRN for pain after wound care  Pain scale < 3/10  Assess pain every shift and PRN   Skin   stage 4 pressure ulcer to sacrum and stage 2 to left buttocks  no new skin breakdown  Assess skin every shift and PRN     Discharge Planning:  assesment pending   Team Discussion: Post COVID and oxygen dependent. [place]  with DM and orthostatic BP and HD. Going home with daughter/CAPS aide. Pain noted with dressing changes and neuropathy issues left foot and right hand/fingers.  Patient on target to meet rehab goals: yes, currently min - mod assist for lower body care and CGA-Min overall with supervision goals set  *See Care Plan and progress notes for long and short-term goals.   Revisions to Treatment Plan:  SLP will complete eval and likely discharge service Teaching Needs: Dressing changes, medications, steps  Current Barriers to Discharge: Decreased caregiver support, Wound care and Hemodialysis, 12 steps to bed/bath in the home, has access to 1/2 bath on main level Limited insurance for SNF/HH follow up services Possible Resolutions to Barriers: Family education     Medical Summary Current Status: Respiratory status stable post decannulation, HD per nephro, tachycardia multifactorial due to debility , anemia,hypotension, also has  + neuropathy  Barriers to Discharge: Medical stability   Possible Resolutions to [employer] Focus: initiate rehab program, monitor HR, BP, nephro to manage HD, may need to adjust midodrine   Continued Need for Acute Rehabilitation Level of Care: The patient requires daily medical management by a physician with specialized training in physical medicine and rehabilitation for the following reasons: Direction of a multidisciplinary physical rehabilitation program to maximize functional independence : Yes Medical management of patient stability for increased activity during participation  in an intensive rehabilitation regime.: Yes Analysis of laboratory values and/or radiology reports with any subsequent need for medication adjustment and/or medical intervention. : Yes   I attest that I was present, lead the team conference, and concur with the assessment and plan of the team.   Dorien Chihuahua B 05/16/2020, 12:53 PM

## 2020-05-16 NOTE — Progress Notes (Signed)
Patient ID: Wanda Ramirez, female   DOB: 1970-09-15, 48 y.o.   MRN: [id number] Team Conference Report to Patient/Family  Team Conference discussion was reviewed with the patient and caregiver, including goals, any changes in plan of care and target discharge date.  Patient and caregiver express understanding and are in agreement.  The patient has a target discharge date of  (initial evals pending).  Dyanne Iha 05/16/2020, 3:30 PM

## 2020-05-16 NOTE — Evaluation (Signed)
**Note Wanda-Identified via Obfuscation** Occupational Therapy Assessment and Plan  Patient Details  Name: Wanda Ramirez MRN: [id number] Date of Birth: 1971-03-18  OT Diagnosis: abnormal posture, acute pain, muscle weakness (generalized) and decreased activity tolerance and skin integrity Rehab Potential: Rehab Potential (ACUTE ONLY): Good ELOS: 2 - 2.5 weeks   Today's Date: 05/16/2020 OT Individual Time: 0801-0900 OT Individual Time Calculation (min): 59 min     Hospital Problem: Principal Problem:   Debility Active Problems:   End stage renal disease on dialysis (Wanda Ramirez)   COVID-19   Pressure injury of skin   Sacral wound   Past Medical History:  Past Medical History:  Diagnosis Date  . Arthritis   . Asthma   . Chronic back pain   . Diabetes mellitus (Wanda Ramirez)   . ESRD (end stage renal disease) on dialysis ([facility])    TTS  . Essential hypertension   . GERD (gastroesophageal reflux disease)   . Glaucoma   . Hyperlipidemia   . Hypothyroidism   . Morbid obesity (Wanda Ramirez)   . Peripheral neuropathy   . Sleep apnea    wears CPAP, does not know setting   Past Surgical History:  Past Surgical History:  Procedure Laterality Date  . AV FISTULA PLACEMENT Left 04/2014  . CARDIAC CATHETERIZATION N/A 03/17/2016   Procedure: Left Heart Cath and Coronary Angiography;  Surgeon: [name], MD;  Location: [facility];  Service: Cardiovascular;  Laterality: N/A;  . INCISION AND DRAINAGE PERIRECTAL ABSCESS N/A 04/03/2020   Procedure: EXCISIONAL DEBRIDEMENT OF SACRAL AND GLUTEAL WOUNDS;  Surgeon: [name], MD;  Location: [facility];  Service: General;  Laterality: N/A;  . IR FLUORO GUIDE CV LINE LEFT  02/22/2020  . IR FLUORO GUIDE CV LINE LEFT  02/27/2020  . IR US GUIDE VASC ACCESS LEFT  02/22/2020  . KNEE SURGERY    . TUBAL LIGATION      Assessment & Plan Clinical Impression: Patient is a 49 y.o. year old female with history of T2DM, ESRD-HD TTS, morbid obesity, chronic respiratory failure--3 L oxygen dependent  who was admitted on 02/13/20 with acute on chronic respiratory failure due to Covid 19 PNA. She was treated with remdesiver, steroids, tocilizumab X 1 as well as broad spectrum antibiotics but developed lethargy with increase in oxygen needs due to ARDS and was intubated 09/26.  Patient transferred to CIR on 05/15/2020 .    Patient currently requires min to mod A with basic self-care skills and IADL secondary to muscle weakness and decreased cardiorespiratoy endurance  And acute pain.  Prior to hospitalization, patient could complete with modified independent .  Patient will benefit from skilled intervention to decrease level of assist with basic self-care skills, increase independence with basic self-care skills and increase level of independence with iADL prior to discharge to home .  Anticipate patient will require 24 hour supervision and follow up home health.  OT - End of Session Activity Tolerance: Tolerates < 10 min activity, no significant change in vital signs Endurance Deficit: Yes Endurance Deficit Description: Impaired activity tolerance req frequent seated rest breaks during ADLs. OT Assessment Rehab Potential (ACUTE ONLY): Good OT Barriers to Discharge: Inaccessible home environment;Wound Care;Weight OT Patient demonstrates impairments in the following area(s): Endurance;Pain;Skin Integrity;Motor;Sensory OT Basic ADL's Functional Problem(s): Grooming;Bathing;Dressing;Toileting OT Advanced ADL's Functional Problem(s): Simple Meal Preparation;Light Housekeeping OT Transfers Functional Problem(s): Toilet;Tub/Shower OT Additional Impairment(s): None OT Plan OT Intensity: Minimum of 1-2 x/day, 45 to 90 minutes OT Frequency: 5 out of 7 days OT Duration/Estimated  Length of Stay: 2 - 2.5 weeks OT Treatment/Interventions: Balance/vestibular training;Community reintegration;Discharge planning;DME/adaptive equipment instruction;Functional mobility training;Disease  mangement/prevention;Patient/family education;Self Care/advanced ADL retraining;Therapeutic Exercise;UE/LE Coordination activities;Wheelchair propulsion/positioning;UE/LE Strength taining/ROM;Therapeutic Activities;Skin care/wound managment;Psychosocial support;Pain management OT Self Feeding Anticipated Outcome(s): ind OT Basic Self-Care Anticipated Outcome(s): sup OT Toileting Anticipated Outcome(s): sup OT Bathroom Transfers Anticipated Outcome(s): sup OT Recommendation Patient destination: Home Follow Up Recommendations: Home health OT;24 hour supervision/assistance Equipment Recommended: 3 in 1 bedside comode;Tub/shower bench Equipment Details: bariatric 3in1   OT Evaluation Precautions/Restrictions  Precautions Precautions: Fall;Other (comment) Precaution Comments: 3L O2 with mobility, sacral wound, trach Restrictions Weight Bearing Restrictions: No General Chart Reviewed: Yes Pain Pain Assessment Pain Scale: Faces Faces Pain Scale: Hurts even more Pain Type: Acute pain Pain Location: Tibia Pain Orientation: Right;Left Pain Descriptors / Indicators: Grimacing;Discomfort Pain Onset: With Activity Pain Intervention(s): Rest Multiple Pain Sites: Yes 2nd Pain Site Pain Score: 5 Pain Type: Acute pain Pain Location: Sacrum Pain Onset: With Activity Pain Intervention(s): Rest Home Living/Prior Functioning Home Living Living Arrangements: Alone (Son stay with pt during the week for work, returns to his home on weekends) Available Help at Discharge: Family,Available 24 hours/day (need to verify 24/7 assist) Type of Home: Apartment (1st floor entrance) Home Access: Level entry Home Layout: Two level,Bed/bath upstairs,1/2 bath on main level Alternate Level Stairs-Number of Steps: 12 Bathroom Shower/Tub: Chiropodist: Standard Bathroom Accessibility: Yes Additional Comments: Per epic review  Lives With: Daughter IADL History Homemaking  Responsibilities: Yes Meal Prep Responsibility: Primary Laundry Responsibility: Primary Cleaning Responsibility: Primary Child Care Responsibility: Secondary (Occasionally takes care of grandchildren (middle school aged)) Current License: Yes Occupation: On disability Leisure and Hobbies: Taking care of her grandkids. Prior Function Level of Independence: Independent with basic ADLs,Independent with homemaking with ambulation,Other (comment) (mod I for community mobility with RW)  Able to Take Stairs?: Yes Driving: Yes Vision Baseline Vision/History: Wears glasses Wears Glasses: Distance only Patient Visual Report: Blurring of vision (c/o of blurriness since hospital admission, will need further assessment with glasses) Vision Assessment?: Yes Eye Alignment: Within Functional Limits Ocular Range of Motion: Within Functional Limits Alignment/Gaze Preference: Within Defined Limits Tracking/Visual Pursuits: Able to track stimulus in all quads without difficulty Saccades: Within functional limits Convergence: Within functional limits Visual Fields: No apparent deficits Perception  Perception: Within Functional Limits Praxis Praxis: Intact Cognition Overall Cognitive Status: Within Functional Limits for tasks assessed Arousal/Alertness: Awake/alert Orientation Level: Person;Place;Situation Person: Oriented Place: Oriented Situation: Oriented Year: 2021 Month: December Day of Week: Correct Memory: Appears intact Immediate Memory Recall: Sock;Blue;Bed Memory Recall Sock: Without Cue Memory Recall Blue: Without Cue Memory Recall Bed: Without Cue Attention: Selective;Sustained;Focused Focused Attention: Appears intact Sustained Attention: Appears intact Selective Attention: Appears intact Awareness: Appears intact Problem Solving: Appears intact Safety/Judgment: Appears intact Sensation Sensation Light Touch: Appears Intact Hot/Cold: Not tested Proprioception: Not  tested Stereognosis: Not tested Additional Comments: Pt states she has numbness in R hand, bilat feet, able to detect light touch in R hand accurately. Coordination Gross Motor Movements are Fluid and Coordinated: Yes Fine Motor Movements are Fluid and Coordinated: No (delayed R thumb digit opposition compared to L hand) Motor  Motor Motor: Abnormal postural alignment and control Motor - Skilled Clinical Observations: forward trunk flexion in standing 2/2 bilat shin pain  Trunk/Postural Assessment  Cervical Assessment Cervical Assessment: Exceptions to [facility] (cervical flexion) Thoracic Assessment Thoracic Assessment: Exceptions to [facility] (thoracic kyphosis) Lumbar Assessment Lumbar Assessment: Within Functional Limits Postural Control Postural Control: Deficits on evaluation (limited postural control when  standing upright 2/2 pain in bilat shins and limited activity tolerance)  Balance Balance Balance Assessed: Yes Static Sitting Balance Static Sitting - Balance Support: Feet supported Static Sitting - Level of Assistance: 5: Stand by assistance Dynamic Sitting Balance Dynamic Sitting - Balance Support: Feet supported Dynamic Sitting - Level of Assistance: 3: Mod assist Dynamic Sitting Balance - Compensations: posterior lean Dynamic Sitting - Balance Activities: Trunk control activities Sitting balance - Comments: reliant on bil UE support for pressure relief from sacral wound Static Standing Balance Static Standing - Balance Support: Bilateral upper extremity supported Static Standing - Level of Assistance: 4: Min assist Dynamic Standing Balance Dynamic Standing - Balance Support: Left upper extremity supported Dynamic Standing - Level of Assistance: 3: Mod assist Extremity/Trunk Assessment RUE Assessment RUE Assessment: Within Functional Limits Passive Range of Motion (PROM) Comments: WFL General Strength Comments: 4/5, R grip strength weaker than L, ~3+/5 LUE Assessment LUE  Assessment: Within Functional Limits Passive Range of Motion (PROM) Comments: WFL General Strength Comments: 4/5, L grip strength 4/5  Care Tool Care Tool Self Care Eating   Eating Assist Level: Minimal Assistance - Patient > 75%    Oral Care         Bathing   Body parts bathed by patient: Face;Left lower leg;Right lower leg;Left upper leg;Left arm;Right arm;Right upper leg;Chest;Front perineal area;Abdomen Body parts bathed by helper: Buttocks   Assist Level: Minimal Assistance - Patient > 75%    Upper Body Dressing(including orthotics)   What is the patient wearing?: Hospital gown only;Pull over shirt   Assist Level: Supervision/Verbal cueing    Lower Body Dressing (excluding footwear)   What is the patient wearing?: Incontinence brief;Pants Assist for lower body dressing: Moderate Assistance - Patient 50 - 74%    Putting on/Taking off footwear   What is the patient wearing?: Non-skid slipper socks Assist for footwear: Moderate Assistance - Patient 50 - 74%       Care Tool Toileting Toileting activity   Assist for toileting: Total Assistance - Patient < 25%     Care Tool Bed Mobility Roll left and right activity   Roll left and right assist level: Minimal Assistance - Patient > 75%    Sit to lying activity   Sit to lying assist level: Minimal Assistance - Patient > 75%    Lying to sitting edge of bed activity   Lying to sitting edge of bed assist level: Contact Guard/Touching assist     Care Tool Transfers Sit to stand transfer   Sit to stand assist level: Moderate Assistance - Patient 50 - 74% (RW)    Chair/bed transfer         Toilet transfer         Care Tool Cognition Expression of Ideas and Wants Expression of Ideas and Wants: Without difficulty (complex and basic) - expresses complex messages without difficulty and with speech that is clear and easy to understand   Understanding Verbal and Non-Verbal Content Understanding Verbal and Non-Verbal  Content: Understands (complex and basic) - clear comprehension without cues or repetitions   Memory/Recall Ability *first 3 days only Memory/Recall Ability *first 3 days only: Current season;That he or she is in a hospital/hospital unit    Refer to Care Plan for [facility] 1 OT Short Term Goal 1 (Week 1): Pt will don BLE into pants with min A. OT Short Term Goal 2 (Week 1): Pt will complete LB bathing with min A. OT  Short Term Goal 3 (Week 1): Pt will complete sit<>stand in prep for standing ADLs with close supervision.  Recommendations for other services: None    Skilled Therapeutic Intervention ADL ADL Eating: Set up Where Assessed-Eating: Chair Grooming: Supervision/safety Where Assessed-Grooming: Edge of bed Upper Body Bathing: Minimal assistance Where Assessed-Upper Body Bathing: Edge of bed Lower Body Bathing: Minimal assistance Where Assessed-Lower Body Bathing: Edge of bed Upper Body Dressing: Supervision/safety Where Assessed-Upper Body Dressing: Edge of bed Lower Body Dressing: Moderate assistance Where Assessed-Lower Body Dressing: Edge of bed Toileting: Minimal cueing Where Assessed-Toileting: Bedside Commode Toilet Transfer: Moderate assistance Toilet Transfer Method: Stand pivot Toilet Transfer Equipment: Extra wide bedside commode Tub/Shower Transfer: Not assessed Social research officer, government: Not assessed Mobility  Bed Mobility Bed Mobility: Supine to Sit;Sit to Supine Supine to Sit: Supervision/Verbal cueing Sit to Supine: Minimal Assistance - Patient > 75% Transfers Sit to Stand: Moderate Assistance - Patient 50-74% Stand to Sit: Minimal Assistance - Patient > 75% Session Note: Pt received in bed wit HOB at 64. Pt agreeable to getting bathed and dressed EOB. Sup > sit EOB with close supervision. CGA to scoot forward. C/o of sacral wound pain with scooting forward. Doffed hospital gown with min A to untie in back. Seated EOB bathed  UB with min A to maintain care around trach / IV site. Bathed LB with overall mod A for sit<>stand transfer to maintain upright posture + B hand placement, total A to wash buttocks in sit<>stand. Tolerates standing for ~15 seconds before c/o of bilat shin pain, pain resolves with seated rest break. Donned deodorant with close supervision. Donned paper scrub top with close supervision. Donned brief with mod A to thread BLE and pull over hips in standing. Sit<>stand with RW + mod A to maintain upright posture + min VCs for hand placement. Req 2 sit<>stands to don brief as first attempt pt had to sit back down 2/2 pain/fatigue after ~3 seconds. Donned paper scrub bottoms with mod A to thread BLE + pull over hips in standing. Sit <>stand with RW + min A to maintain upright posture. Doffed gripper socks with CGA for balance. Donned gripper socks with mod A to don L sock + balance. Overall, completes sit<>stands with RW + min to mod A, more fatigued throughout session. SatO2 throughout session at 95 - 98% on RA, HR at 106 - 130 with activity. Reports use of 3L of O2 at baseline with activity but not on O2 during session. Returned to sup with min A to lift LLE onto bed. Left in bed with HOB at 35, bed alarm engaged, call bell in reach, and RN present.    Discharge Criteria: Patient will be discharged from OT if patient refuses treatment 3 consecutive times without medical reason, if treatment goals not met, if there is a change in medical status, if patient makes no progress towards goals or if patient is discharged from hospital.  The above assessment, treatment plan, treatment alternatives and goals were discussed and mutually agreed upon: by patient  Volanda Napoleon , MS, OTR/L 05/16/2020, 4:51 PM

## 2020-05-16 NOTE — Progress Notes (Signed)
Patient ID: Wanda Ramirez, female   DOB: 08-Aug-1970, 49 y.o.   MRN: [id number] Met with the patient to review role of the nurse CM and collaboration with the SW to facilitate preparation for discharge. Reviewed support system at discharge and management of co-morbidities PTA. Discussed DM management/dietary modifications with A1c of 6.1. on diabetic/renal diet. Reviewed HLD/Trig level of 395 and given handout on dietary recommendations to decrease triglyceride level with recommended dietary modifications. Reported sedentary lifestyle prior to admission and review of recommended increased activities as tolerated given oxygen dependence and limited activity tolerance level. Continue to follow along to discharge to address educational needs including skin care/wound care as modifications in orders noted. No questions or concerns noted at present. Margarito Liner

## 2020-05-16 NOTE — Progress Notes (Signed)
Inpatient Rehabilitation  Patient information reviewed and entered into eRehab system by Tanyah Debruyne M. Nivia Gervase, M.A., CCC/SLP, PPS Coordinator.  Information including medical coding, functional ability and quality indicators will be reviewed and updated through discharge.    

## 2020-05-16 NOTE — Progress Notes (Signed)
Patient Details  Name: Wanda Ramirez MRN: [id number] Date of Birth: 23-Aug-1970  Today's Date: 05/16/2020  Hospital Problems: Principal Problem:   Debility Active Problems:   End stage renal disease on dialysis ([place])   COVID-19   Pressure injury of skin   Sacral wound  Past Medical History:  Past Medical History:  Diagnosis Date  . Arthritis   . Asthma   . Chronic back pain   . Diabetes mellitus ([place])   . ESRD (end stage renal disease) on dialysis (H[facility])    TTS  . Essential hypertension   . GERD (gastroesophageal reflux disease)   . Glaucoma   . Hyperlipidemia   . Hypothyroidism   . Morbid obesity ([place])   . Peripheral neuropathy   . Sleep apnea    wears CPAP, does not know setting   Past Surgical History:  Past Surgical History:  Procedure Laterality Date  . AV FISTULA PLACEMENT Left 04/2014  . CARDIAC CATHETERIZATION N/A 03/17/2016   Procedure: Left Heart Cath and Coronary Angiography;  Surgeon: [name], MD;  Location: [facility];  Service: Cardiovascular;  Laterality: N/A;  . INCISION AND DRAINAGE PERIRECTAL ABSCESS N/A 04/03/2020   Procedure: EXCISIONAL DEBRIDEMENT OF SACRAL AND GLUTEAL WOUNDS;  Surgeon: [name], MD;  Location: [facility];  Service: General;  Laterality: N/A;  . IR FLUORO GUIDE CV LINE LEFT  02/22/2020  . IR FLUORO GUIDE CV LINE LEFT  02/27/2020  . IR US GUIDE VASC ACCESS LEFT  02/22/2020  . KNEE SURGERY    . TUBAL LIGATION     Social History:  reports that she has quit smoking. She has never used smokeless tobacco. She reports that she does not drink alcohol and does not use drugs.  Family / Support Systems Marital Status: Single Patient Roles: Parent Children: Barnie Alderman (daughter), [name] (Daughter), [name] (Daughter) Other Supports: Designer, television/film set (sister) Anticipated Caregiver: Tiona (main contact), Jamas Lav and family memebers Ability/Limitations of Caregiver: Barnie Alderman is patient CAP aide and is concerned about caring for wound  at discharge Caregiver Availability: 24/7 Barnie Alderman and siblings will provide 24/7 at discharge. Discussed with [facility])  Social History Preferred language: English Religion:  Read: Yes Write: Yes Employment Status: Disabled   Abuse/Neglect Abuse/Neglect Assessment Can Be Completed: Yes Physical Abuse: Denies Verbal Abuse: Denies Sexual Abuse: Denies Exploitation of patient/patient's resources: Denies Self-Neglect: Denies  Emotional Status Recent Psychosocial Issues: no Psychiatric History: no Substance Abuse History: no  Patient / Family Perceptions, Expectations & Goals Premorbid pt/family roles/activities: Previously independent, driving Pt/family expectations/goals: MOD I to [place]: None Premorbid Home Care/DME Agencies: None Transportation available at discharge: Family able to transport Resource referrals recommended: Neuropsychology (Coping)  Discharge Planning Living Arrangements: Alone (Son stay with pt during the week for work, returns to his home on weekends) Support Systems: [place] relatives Type of Residence: Private residence (2 level apartment (1/2 half on main level, bed/bath upstairs) 12 steps to 2nd level, Level entry) Insurance Resources: Medicaid (specify county) ([place] Access) Financial Resources: [employer] Financial Screen Referred: No Living Expenses: Education officer, community Management: Patient Does the patient have any problems obtaining your medications?: No Home Management: Independent Patient/Family Preliminary Plans: Family will assist at discharge. Care Coordinator Barriers to Discharge: Trach,Wound Care,Insurance for SNF coverage Care Coordinator Barriers to Discharge Comments: Insurance (cannot guarentee SNF or HH (SN/PT/OT/SPH), Patient ability to tolerate sitting 5 to 6 hours for OP HD and children's ability to care for wound Care Coordinator Anticipated Follow Up  Needs: HH/OP DC Planning Additional  Notes/Comments: T, TH, SAT HD. Expected length of stay: 2-3 Weeks  Clinical Impression SW entered patients room, introduced self, explained role and addressed questions and concerns. Pt confirms she does not plan to discharge to SNF. Pt plans to discharge home potentially with [facility] (cannot guarantee due to insurance), pt currently working on [employer]. SW informed patient of the barriers we may run into due to insurance such as: SNF/HH, SN, and DME. Sw has informed patient possibility of daughter having to care for wound in-home at discharge. Sw will inform daughter that she will need to be educated as well on pt wound. Pt reports her daughter will continue to transport her to HD. T TH Sat schedule. Will need to tolerate sitting 5 to 6 hours for OP HD.  Pt was understanding of everything, no additional questions or concerns  Dyanne Iha 05/16/2020, 3:25 PM

## 2020-05-16 NOTE — Evaluation (Signed)
Speech Language Pathology Assessment and Plan  Patient Details  Name: Wanda Ramirez MRN: [id number] Date of Birth: August 31, 1970  SLP Diagnosis:  (n/a for ST)  Rehab Potential: Good ELOS: n/a for ST (~2 weeks for PT/OT)    Today's Date: 05/16/2020 SLP Individual Time: 1350-1430 SLP Individual Time Calculation (min): 40 min   Hospital Problem: Principal Problem:   Debility Active Problems:   End stage renal disease on dialysis ([place])   COVID-19   Pressure injury of skin   Sacral wound  Past Medical History:  Past Medical History:  Diagnosis Date  . Arthritis   . Asthma   . Chronic back pain   . Diabetes mellitus ([place])   . ESRD (end stage renal disease) on dialysis ([facility])    TTS  . Essential hypertension   . GERD (gastroesophageal reflux disease)   . Glaucoma   . Hyperlipidemia   . Hypothyroidism   . Morbid obesity ([place])   . Peripheral neuropathy   . Sleep apnea    wears CPAP, does not know setting   Past Surgical History:  Past Surgical History:  Procedure Laterality Date  . AV FISTULA PLACEMENT Left 04/2014  . CARDIAC CATHETERIZATION N/A 03/17/2016   Procedure: Left Heart Cath and Coronary Angiography;  Surgeon: [name], MD;  Location: [place] [facility];  Service: Cardiovascular;  Laterality: N/A;  . INCISION AND DRAINAGE PERIRECTAL ABSCESS N/A 04/03/2020   Procedure: EXCISIONAL DEBRIDEMENT OF SACRAL AND GLUTEAL WOUNDS;  Surgeon: [name], MD;  Location: [place];  Service: General;  Laterality: N/A;  . IR FLUORO GUIDE CV LINE LEFT  02/22/2020  . IR FLUORO GUIDE CV LINE LEFT  02/27/2020  . IR US GUIDE VASC ACCESS LEFT  02/22/2020  . KNEE SURGERY    . TUBAL LIGATION      Assessment / Plan / Recommendation Clinical Impression   Wanda Ramirez is a 49 year old female with history of T2DM, ESRD-HD TTS,morbid obesity,chronic respiratory failure--3 L oxygen dependent who was admitted on 02/13/20 with acute on chronic respiratory failure due  toCovid19 PNA.She was treated with remdesiver, steroids, tocilizumab X 1 as well as broad spectrum antibiotics but developed lethargy with increase in oxygen needs due to ARDS and was intubated 09/26. V/Q scan negative for PE. Nephrology following for management ofHDand required CRRT due to hypotension. She has had issues with LUA AVF clotting requring TDC X 2(last by Dr. Anselm Pancoast on 09/28). She developed thrombocytopenia --HIT panel negative and was kept fully anticoagulated with IV heparin due to RV dysfunction and elevated D dimer. She was extubated on 10/06 but developed stridor with increased WOB and fever T-103 due to MSSA PNA requiring reintubation and ultimately required tracheostomy 10/18.  She was on tube feeds for nutritional support but developed diarrhea requiring flexiseal.She developedlarge stage IV left buttock/sacral wound with exposed sacrum and bloody drainage with anemia and fevers. She was taken to OR for I & D 11/02 by Dr. Bobbye Morton (4 units PRBC/500 cc albumin administered perioperatively). Woundcultures positive for enteroccocus faecalis and ESBL E coliand treated with Meropenum She continued to have loose stools with oozing requiring frequent dressing changes. Hydrotherapy ongoing and wound VAC placed briefly but wound started developing significant "C-diff type" odor with thick yellow drainage on 12/01. Wound VAC was removed with dakins used for local care and as drainage resolved replaced 12/06 but VAC not maintaining seal due to significant amount of wound drainage. Decision made by WOC/CCS to discontinue VAC on 12/13  with moist damp to dressing changes for now. CCS recommends replacing wound VAC if drainage resolves --in approx a week or so?  Issues with delirium/metabolic encephalopathy has resolved. She was extubated to BIPAP, tolerated capping andto bedecannulated today. Dr. Tamala Julian recommends ENT referral if stoma does not close in 4 weeks. Hypotension being managed with  midodrine on board and anemia of chronic illness being treated with aranesp --IV iron on hold due to infection. She is tolerating regular diet and showing improvement in activity tolerance. 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 She reported some baseline challenges with reading comprehension, calculations, and short term memory. Given known hx of learning disability, formal standardized assessments would not be appropriate to assess her cognitive functioning. Therefore, cognitive-linguistic function was assessed during functional tasks. Her problem solving, attention, safety awareness, and recall of new/daily information was grossly [facility] during assessment. Her speech was fluent and expressive/receptive language skills WFL. Her voice was noted to be slightly hoarse and a low vocal intensity, suspect due to prolonged intubation. Discussed potential need for OP ENT consult to instrumentally assess vocal fold function if low intensity and hoarse vocal quality persists for longer than 2-3 weeks. Pt has also been tolerating regular/thin diet without issue. She was decannulated 05/15/20. Given that pt is grossly [facility] for cognition and at baseline and has 24/7 supervision at discharge, no skilled ST is indicated while inpatient.    Skilled Therapeutic Interventions          Cognitive-linguistic evaluation was administered and results were reviewed with pt (please see above for details regarding results).    SLP Assessment  Patient does not need any further Speech [place] [facility] Services    Recommendations  Patient destination: Home Follow up Recommendations: None Equipment Recommended: None recommended by SLP         SLP Duration     n/a for ST (~2 weeks for PT/OT)          Pain Pain Assessment Pain  Scale: Faces Faces Pain Scale: Hurts little more Pain Type: Acute pain Pain Location: Back Pain Orientation: Posterior Pain Descriptors / Indicators: Grimacing;Discomfort Pain Onset: With Activity Pain Intervention(s): Repositioned Multiple Pain Sites: No  Prior Functioning Type of Home: Apartment  Lives With: Daughter Available Help at Discharge: Family;Available 24 hours/day  SLP Evaluation Cognition Overall Cognitive Status: Within Functional Limits for tasks assessed Arousal/Alertness: Awake/alert Orientation Level: Oriented X4 Safety/Judgment: Appears intact  Comprehension Auditory Comprehension Overall Auditory Comprehension: Appears within functional limits for tasks assessed Conversation: Complex Visual Recognition/Discrimination Discrimination: Within Function Limits Reading Comprehension Reading Status: Within funtional limits Expression Expression Primary Mode of Expression: Verbal Verbal Expression Overall Verbal Expression: Appears within functional limits for tasks assessed Oral Motor Oral Motor/Sensory Function Overall Oral Motor/Sensory Function: Generalized oral weakness (mild) Motor Speech Overall Motor Speech: Impaired (although 100% intelligible) Phonation: Hoarse;Low vocal intensity Intelligibility: Intelligible Word: 75-100% accurate Phrase: 75-100% accurate Sentence: 75-100% accurate Conversation: 75-100% accurate Motor Speech Errors: Not applicable Effective Techniques: Increased vocal intensity  Care Tool Care Tool Cognition Expression of Ideas and Wants Expression of Ideas and Wants: Without difficulty (complex and basic) - expresses complex messages without difficulty and with speech that is clear and easy to understand   Understanding Verbal and Non-Verbal Content Understanding Verbal and Non-Verbal Content: Understands (complex and basic) - clear comprehension without cues or  repetitions   Memory/Recall Ability *first 3 days only  Memory/Recall Ability *first 3 days only: Current season;That he or she is in a hospital/hospital unit     Intelligibility: Intelligible Word: 75-100% accurate Phrase: 75-100% accurate Sentence: 75-100% accurate Conversation: 75-100% accurate  Short Term Goals: No short term goals set  Recommendations for other services: None   The above assessment, treatment plan, treatment alternatives and goals were discussed and mutually agreed upon: by patient  Arbutus Leas 05/16/2020, 2:32 PM

## 2020-05-16 NOTE — Evaluation (Signed)
Physical Therapy Assessment and Plan  Patient Details  Name: Wanda Ramirez MRN: [id number] Date of Birth: [birth year]  PT Diagnosis: Abnormal posture, Abnormality of gait, Difficulty walking, Impaired sensation, Muscle weakness and Pain in buttocks due to sacral wound Rehab Potential: Good ELOS: 2-3 weeks   Today's Date: 05/16/2020 PT Individual Time: 0905-1000 PT Individual Time Calculation (min): 55 min    Hospital Problem: Principal Problem:   Debility Active Problems:   End stage renal disease on dialysis ([place])   COVID-19   Pressure injury of skin   Sacral wound   Past Medical History:  Past Medical History:  Diagnosis Date  . Arthritis   . Asthma   . Chronic back pain   . Diabetes mellitus ([place])   . ESRD (end stage renal disease) on dialysis ([facility])    TTS  . Essential hypertension   . GERD (gastroesophageal reflux disease)   . Glaucoma   . Hyperlipidemia   . Hypothyroidism   . Morbid obesity ([place])   . Peripheral neuropathy   . Sleep apnea    wears CPAP, does not know setting   Past Surgical History:  Past Surgical History:  Procedure Laterality Date  . AV FISTULA PLACEMENT Left 04/2014  . CARDIAC CATHETERIZATION N/A 03/17/2016   Procedure: Left Heart Cath and Coronary Angiography;  Surgeon: [name], MD;  Location: [place] [facility];  Service: Cardiovascular;  Laterality: N/A;  . INCISION AND DRAINAGE PERIRECTAL ABSCESS N/A 04/03/2020   Procedure: EXCISIONAL DEBRIDEMENT OF SACRAL AND GLUTEAL WOUNDS;  Surgeon: [name], MD;  Location: [place];  Service: General;  Laterality: N/A;  . IR FLUORO GUIDE CV LINE LEFT  02/22/2020  . IR FLUORO GUIDE CV LINE LEFT  02/27/2020  . IR US GUIDE VASC ACCESS LEFT  02/22/2020  . KNEE SURGERY    . TUBAL LIGATION      Assessment & Plan Clinical Impression: Patient is a 49 y.o. year old female with history of T2DM, ESRD-HD TTS,morbid obesity,chronic respiratory failure--3 L oxygen dependent who was admitted  on 02/13/20 with acute on chronic respiratory failure due toCovid19 PNA.She was treated with remdesiver, steroids, tocilizumab X 1 as well as broad spectrum antibiotics but developed lethargy with increase in oxygen needs due to ARDS and was intubated 09/26. V/Q scan negative for PE. Nephrology following for management ofHDand required CRRT due to hypotension. She has had issues with LUA AVF clotting requring TDC X 2(last by Dr. Anselm Pancoast on 09/28). She developed thrombocytopenia --HIT panel negative and was kept fully anticoagulated with IV heparin due to RV dysfunction and elevated D dimer. She was extubated on 10/06 but developed stridor with increased WOB and fever T-103 due to MSSA PNA requiring reintubation and ultimately required tracheostomy 10/18.   She was on tube feeds for nutritional support but developed diarrhea requiring flexiseal.She developedlarge stage IV left buttock/sacral wound with exposed sacrum and bloody drainage with anemia and fevers. She was taken to OR for I & D 11/02 by Dr. Bobbye Morton (4 units PRBC/500 cc albumin administered perioperatively). Woundcultures positive for enteroccocus faecalis and ESBL E coliand treated with Meropenum She continued to have loose stools with oozing requiring frequent dressing changes. Hydrotherapy ongoing and wound VAC placed briefly but wound started developing significant "C-diff type" odor with thick yellow drainage on 12/01. Wound VAC was removed with dakins used for local care and as drainage resolved replaced 12/06 but VAC not maintaining seal due to significant amount of wound drainage. Decision made by [employer]  to discontinue VAC on 12/13 with moist damp to dressing changes for now. CCS recommends replacing wound VAC if drainage resolves --in approx a week or so?   Issues with delirium/metabolic encephalopathy has resolved. She was extubated to BIPAP, tolerated capping andto bedecannulated today. Dr. Tamala Julian recommends ENT referral if  stoma does not close in 4 weeks. Hypotension being managed with midodrine on board and anemia of chronic illness being treated with aranesp --IV iron on hold due to infection. She is tolerating regular diet and showing improvement in activity tolerance. Therapy ongoing and CIR recommended due to functional decline..  Patient transferred to CIR on 05/15/2020 .   Patient currently requires min with mobility secondary to muscle weakness and muscle joint tightness, decreased cardiorespiratoy endurance and decreased oxygen support and decreased sitting balance, decreased standing balance, decreased postural control and decreased balance strategies.  Prior to hospitalization, patient was modified independent  with mobility and lived with Daughter in a [place] home.  Home access is  Level entry.  Patient will benefit from skilled PT intervention to maximize safe functional mobility, minimize fall risk and decrease caregiver burden for planned discharge home with 24 hour supervision.  Anticipate patient will benefit from follow up [place] at discharge.  PT - End of Session Activity Tolerance: Tolerates < 10 min activity with changes in vital signs Endurance Deficit: Yes Endurance Deficit Description: pt fatigues very quickly and reports SOB with mobility. 3L of O2 at baseline and will need to be carried through in CIR. PT Assessment Rehab Potential (ACUTE/IP ONLY): Good PT Barriers to Discharge: Inaccessible home environment;Home environment access/layout;Trach;Wound Care;Insurance for SNF coverage PT Barriers to Discharge Comments: pt has 12 stairs to reach her bedroom and bathroom on second level of home PT Patient demonstrates impairments in the following area(s): Balance;Skin Integrity;Pain;Safety;Endurance;Motor PT Transfers Functional Problem(s): Bed Mobility;Bed to Chair;Car;Furniture PT Locomotion Functional Problem(s): Ambulation;Wheelchair Mobility;Stairs PT Plan PT Intensity: Minimum of 1-2 x/day  ,45 to 90 minutes PT Frequency: 5 out of 7 days PT Duration Estimated Length of Stay: 2-3 weeks PT Treatment/Interventions: Ambulation/gait training;Cognitive remediation/compensation;Discharge planning;DME/adaptive equipment instruction;Functional mobility training;Pain management;Psychosocial support;Splinting/orthotics;Therapeutic Activities;UE/LE Strength taining/ROM;Visual/perceptual remediation/compensation;Wheelchair propulsion/positioning;UE/LE Coordination activities;Therapeutic Exercise;Stair training;Skin care/wound management;Patient/family education;Neuromuscular re-education;Functional electrical stimulation;Disease management/prevention;Community reintegration;Balance/vestibular training PT Transfers Anticipated Outcome(s): mod-I PT Locomotion Anticipated Outcome(s): supervision PT Recommendation Recommendations for Other Services: Therapeutic Recreation consult Therapeutic Recreation Interventions: Pet therapy;Kitchen group;Outing/community reintergration Follow Up Recommendations: Home health PT;Skilled nursing facility;24 hour supervision/assistance Patient destination: Home Equipment Recommended: Wheelchair (measurements);To be determined;Rolling walker with 5" wheels;Wheelchair cushion (measurements) Equipment Details: due to sacral wound, pt wil need ROHO air cushion at d/c in order to limit skin breakdown   PT Evaluation Precautions/Restrictions Precautions Precautions: Fall;Other (comment) Precaution Comments: 3L O2 with mobility, sacral wound, trach Restrictions Weight Bearing Restrictions: No General Chart Reviewed: Yes Vital Signs  Pain Pain Assessment Pain Scale: Faces Faces Pain Scale: Hurts little more Pain Location: Buttocks Pain Orientation: Right Pain Descriptors / Indicators: Aching Home Living/Prior Functioning Home Living Available Help at Discharge: Family;Available 24 hours/day Type of Home: Apartment Home Access: Level entry Home Layout: Two  level;Bed/bath upstairs;1/2 bath on main level Alternate Level Stairs-Number of Steps: 12 Bathroom Shower/Tub: Chiropodist: Standard Bathroom Accessibility: Yes  Lives With: Daughter Prior Function Level of Independence: Independent with gait;Independent with homemaking with ambulation;Independent with basic ADLs  Able to Take Stairs?: Yes Driving: Yes Vision/Perception     Cognition Overall Cognitive Status: Within Functional Limits for tasks assessed Arousal/Alertness: Awake/alert Orientation Level: Oriented X4 Sensation  Sensation Light Touch: Appears Intact Additional Comments: pt notes that she has some numbness in her L foot and R hand, however when assessed LT, pt able to accurately state when she felt stimuli. States that it does feel different when asked to clarify Coordination Gross Motor Movements are Fluid and Coordinated: Yes Motor  Motor Motor: Within Functional Limits  Trunk/Postural Assessment  Cervical Assessment Cervical Assessment: Exceptions to [facility] (cervical kyphosis) Thoracic Assessment Thoracic Assessment: Exceptions to [facility] (thoracic kyphosis) Postural Control Postural Control: Deficits on evaluation  Balance Balance Balance Assessed: Yes Static Sitting Balance Static Sitting - Balance Support: Bilateral upper extremity supported Static Sitting - Level of Assistance: 4: Min assist Dynamic Sitting Balance Dynamic Sitting - Balance Support: Bilateral upper extremity supported Dynamic Sitting - Level of Assistance: 4: Min assist Dynamic Sitting Balance - Compensations: posterior lean Dynamic Sitting - Balance Activities: Trunk control activities Sitting balance - Comments: reliant on bil UE support for pressure relief from sacral wound Static Standing Balance Static Standing - Balance Support: Bilateral upper extremity supported Static Standing - Level of Assistance: 4: Min assist Dynamic Standing Balance Dynamic Standing - Balance  Support: During functional activity Extremity Assessment  RLE Assessment General Strength Comments: Pt appears to have 4/5 strength globally LLE Assessment General Strength Comments: pt appears to have 4/5 strength globally  Care Tool Care Tool Bed Mobility Roll left and right activity   Roll left and right assist level: Minimal Assistance - Patient > 75%    Sit to lying activity   Sit to lying assist level: Minimal Assistance - Patient > 75%    Lying to sitting edge of bed activity   Lying to sitting edge of bed assist level: Minimal Assistance - Patient > 75%     Care Tool Transfers Sit to stand transfer   Sit to stand assist level: Contact Guard/Touching assist    Chair/bed transfer   Chair/bed transfer assist level: Minimal Assistance - Patient > 75%     Toilet transfer Toilet transfer activity did not occur: Safety/medical concerns      Geneticist, molecular transfer assist level: Minimal Assistance - Patient > 75%      Care Tool Locomotion Ambulation   Assist level: Minimal Assistance - Patient > 75% Assistive device: Walker-rolling Max distance: 41f  Walk 10 feet activity   Assist level: Minimal Assistance - Patient > 75% Assistive device: Walker-rolling   Walk 50 feet with 2 turns activity Walk 50 feet with 2 turns activity did not occur: Safety/medical concerns      Walk 150 feet activity Walk 150 feet activity did not occur: Safety/medical concerns      Walk 10 feet on uneven surfaces activity Walk 10 feet on uneven surfaces activity did not occur: Safety/medical concerns      Stairs Stair activity did not occur: Safety/medical concerns        Walk up/down 1 step activity Walk up/down 1 step or curb (drop down) activity did not occur: Safety/medical concerns     Walk up/down 4 steps activity did not occuR: Safety/medical concerns  Walk up/down 4 steps activity      Walk up/down 12 steps activity Walk up/down 12 steps activity did not occur:  Safety/medical concerns      Pick up small objects from floor Pick up small object from the floor (from standing position) activity did not occur: Safety/medical concerns      Wheelchair Will patient use wheelchair at discharge?: Yes Type of Wheelchair: Manual  Wheelchair assist level: Minimal Assistance - Patient > 75% Max wheelchair distance: 19f  Wheel 50 feet with 2 turns activity   Assist Level: Minimal Assistance - Patient > 75%  Wheel 150 feet activity   Assist Level: Moderate Assistance - Patient 50 - 74%    Refer to Care Plan for Long Term Goals  SHORT TERM GOAL WEEK 1 PT Short Term Goal 1 (Week 1): pt will perform all transfers with CGA PT Short Term Goal 2 (Week 1): pt will perform bed mobility with CGA PT Short Term Goal 3 (Week 1): pt will ambulate ~329fwith CGA and LRAD PT Short Term Goal 4 (Week 1): pt will initiate stair training  Recommendations for other services: Therapeutic Recreation  Pet therapy, Kitchen group and Outing/community reintegration  Skilled Therapeutic Intervention Mobility Bed Mobility Bed Mobility: Rolling Left;Rolling Right;Left Sidelying to Sit;Sit to Sidelying Left Rolling Right: Contact Guard/Touching assist Rolling Left: Minimal Assistance - Patient > 75% Left Sidelying to Sit: Minimal Assistance - Patient >75% Sit to Sidelying Left: Minimal Assistance - Patient > 75% Transfers Transfers: Sit to Stand;Stand to Sit;Stand Pivot Transfers Sit to Stand: Minimal Assistance - Patient > 75%;Contact Guard/Touching assist Stand to Sit: Minimal Assistance - Patient > 75% Stand Pivot Transfers: Contact Guard/Touching assist Transfer (Assistive device): Rolling walker Locomotion  Gait Ambulation: Yes Gait Distance (Feet): 14 Feet Assistive device: Rolling walker Gait Gait: Yes Gait Pattern: Step-to pattern;Decreased step length - right;Decreased step length - left;Decreased stride length;Trunk flexed Stairs / Additional  Locomotion Stairs: No Wheelchair Mobility Wheelchair Mobility: Yes Wheelchair Assistance: Minimal assistance - Patient >75% Wheelchair Propulsion: Both upper extremities Distance: 10027f Pt received supine in bed and agreeable to PT. Nursing present to administer medications and dietary in room to retrieve pt's lunch order. Pt able to come to sit L EOB with min assist. Sit>stand CGA and stand pivot transfer to wc with min assist. Pt transported to gym total assist for time management and energy conservation. Pt ambulated ~8ft78fd ~14ft68fh RW and min assist. Pt fatigues very quickly. Pt performed car transfer with min assist with noted increases in pain due to large sacral wound. Pt returned to chair and completed wc propulsion activities with BUE ~100ft.17fneeds constant verbal cueing for sequencing in order to properly propel wc forward. Pt transported to room and performed stand pivot transfer to EOB. Pt requiring min assist for sit>L sidelying. Pt left in sidelying with proper positioning, needs met, call bell in reach, and bed alarm on.    Discharge Criteria: Patient will be discharged from PT if patient refuses treatment 3 consecutive times without medical reason, if treatment goals not met, if there is a change in medical status, if patient makes no progress towards goals or if patient is discharged from hospital.  The above assessment, treatment plan, treatment alternatives and goals were discussed and mutually agreed upon: by patient  BaileyGaylord Shih/2021, 1:04 PM

## 2020-05-16 NOTE — Progress Notes (Signed)
Patient ID: Wanda Ramirez, female   DOB: [birth year], 49 y.o.   MRN: [id number]   SW called pt daughter to provide team conference update and introduce self. SW will continue to follow up  [name], [name]

## 2020-05-16 NOTE — Progress Notes (Signed)
[facility] Individual Statement of Services  Patient Name:  Wanda Ramirez  Date:  05/16/2020  Welcome to the [facility].  Our goal is to provide you with an individualized program based on your diagnosis and situation, designed to meet your specific needs.  With this comprehensive rehabilitation program, you will be expected to participate in at least 3 hours of rehabilitation therapies Monday-Friday, with modified therapy programming on the weekends.  Your rehabilitation program will include the following services:  Physical Therapy (PT), Occupational Therapy (OT), Speech Therapy (ST), 24 hour per day rehabilitation nursing, Therapeutic Recreaction (TR), Neuropsychology, Care Coordinator, Rehabilitation Medicine, Nutrition Services, Pharmacy Services and Other  Weekly team conferences will be held on Wednesday to discuss your progress.  Your Inpatient Rehabilitation Care Coordinator will talk with you frequently to get your input and to update you on team discussions.  Team conferences with you and your family in attendance may also be held.  Expected length of stay: 14-21 Days  Overall anticipated outcome: MOD I to Supervision  Depending on your progress and recovery, your program may change. Your Inpatient Rehabilitation Care Coordinator will coordinate services and will keep you informed of any changes. Your Inpatient Rehabilitation Care Coordinator's name and contact numbers are listed  below.  The following services may also be recommended but are not provided by the [facility]:    [facility] will be made to provide these services after discharge if needed.  Arrangements include referral to agencies that provide these services.  Your insurance has been verified to be:  Medicaid [place] [facility] Your primary doctor is:  Welford Roche, NP  Pertinent  information will be shared with your doctor and your insurance company.  Inpatient Rehabilitation Care Coordinator:  [name], [place] or [phone]  Information discussed with and copy given to patient by: Dyanne Iha, 05/16/2020, 9:06 AM

## 2020-05-17 ENCOUNTER — Inpatient Hospital Stay (HOSPITAL_COMMUNITY): Payer: Medicaid Other

## 2020-05-17 ENCOUNTER — Inpatient Hospital Stay (HOSPITAL_COMMUNITY): Payer: Medicaid Other | Admitting: Physical Therapy

## 2020-05-17 LAB — RENAL FUNCTION PANEL
Albumin: 2.5 g/dL — ABNORMAL LOW (ref 3.5–5.0)
Anion gap: 18 — ABNORMAL HIGH (ref 5–15)
BUN: 81 mg/dL — ABNORMAL HIGH (ref 6–20)
CO2: 25 mmol/L (ref 22–32)
Calcium: 10.8 mg/dL — ABNORMAL HIGH (ref 8.9–10.3)
Chloride: 93 mmol/L — ABNORMAL LOW (ref 98–111)
Creatinine, Ser: 7.54 mg/dL — ABNORMAL HIGH (ref 0.44–1.00)
GFR, Estimated: 6 mL/min — ABNORMAL LOW (ref >60)
Glucose, Bld: 165 mg/dL — ABNORMAL HIGH (ref 70–99)
Phosphorus: 6.8 mg/dL — ABNORMAL HIGH (ref 2.5–4.6)
Potassium: 5.2 mmol/L — ABNORMAL HIGH (ref 3.5–5.1)
Sodium: 136 mmol/L (ref 135–145)

## 2020-05-17 LAB — CBC
HCT: 23.1 % — ABNORMAL LOW (ref 36.0–46.0)
Hemoglobin: 7.3 g/dL — ABNORMAL LOW (ref 12.0–15.0)
MCH: 28 pg (ref 26.0–34.0)
MCHC: 31.6 g/dL (ref 30.0–36.0)
MCV: 88.5 fL (ref 80.0–100.0)
Platelets: 380 10*3/uL (ref 150–400)
RBC: 2.61 MIL/uL — ABNORMAL LOW (ref 3.87–5.11)
RDW: 15.5 % (ref 11.5–15.5)
WBC: 14 10*3/uL — ABNORMAL HIGH (ref 4.0–10.5)
nRBC: 0 % (ref 0.0–0.2)

## 2020-05-17 LAB — GLUCOSE, CAPILLARY
Glucose-Capillary: 116 mg/dL — ABNORMAL HIGH (ref 70–99)
Glucose-Capillary: 186 mg/dL — ABNORMAL HIGH (ref 70–99)
Glucose-Capillary: 188 mg/dL — ABNORMAL HIGH (ref 70–99)
Glucose-Capillary: 292 mg/dL — ABNORMAL HIGH (ref 70–99)

## 2020-05-17 MED ORDER — HEPARIN SODIUM (PORCINE) 1000 UNIT/ML DIALYSIS: 2000.0000 [IU] | Freq: Once | INTRAMUSCULAR | Status: DC

## 2020-05-17 MED ORDER — HEPARIN SODIUM (PORCINE) 1000 UNIT/ML IJ SOLN
INTRAMUSCULAR | Status: AC
  Filled 2020-05-17: qty 6

## 2020-05-17 NOTE — Progress Notes (Signed)
Physical Therapy Session Note  Patient Details  Name: Wanda Ramirez MRN: [id number] Date of Birth: [birth year]  Today's Date: 05/17/2020 PT Individual Time: 0845-1000 PT Individual Time Calculation (min): 75 min   Short Term Goals: Week 1:  PT Short Term Goal 1 (Week 1): pt will perform all transfers with CGA PT Short Term Goal 2 (Week 1): pt will perform bed mobility with CGA PT Short Term Goal 3 (Week 1): pt will ambulate ~32f with CGA and LRAD PT Short Term Goal 4 (Week 1): pt will initiate stair training  Skilled Therapeutic Interventions/Progress Updates:    Pt received in wc and agreeable to PT. HR = 104, O2 = 100  Pt performed 5xSTS from wc with use of hands for support and RW in front for stability. Pt completed in 25sec with rapid descents from standing. Following, pt complains of 10/10 pain in her buttocks which persists. She states that she will be receiving her pain medication following our session.   Kinetron 3x232m bouts @ 40cm/sec from wc seat with pillow beneath bottom and behind back for comfort due to buttock pain.  O2=99, HR=103 after 2nd bout and O2=99, HR=113 after 3rd bout  Pt transported to parallel bars total assist for energy conservation.   Step Ups in Parallel Bars: -2in step x3 reps with min assist for balance -4in step 2x2 reps with min assist for balance -ample rest breaks provided throughout  Returned to room in wc total assist due to sacral pain. Pt performed ambulatory transfer ~[id number] bedside and stand>sit with min assist. Pt able to perform posterior scooting with min assist when provided verbal cueing to lean forward first.   Seated balance tasks: -Pt participated in a variety of reaching tasks in order to obtain objects placed at bedside. Directions include posterior, posterolateral, lateral, and between her feet. No LOB noted and pt able to perform without added UE support. Pt needs min assist for reaching behind her back due to lack of IR ROM  bil.   Pt returned to supine with CGA. Supine>R sidelying with CGA and use of bed features. PT increased softness of bed, placed on alternating setting and set bed alarm. Pt left in R sidelying with pillows placed for positioning, needs met, and call bell in reach.   Throughout session, pt on 3L of O2.  Therapy Documentation Precautions:  Precautions Precautions: Fall,Other (comment) Precaution Comments: 3L O2 with mobility, sacral wound, trach Restrictions Weight Bearing Restrictions: No    Therapy/Group: Individual Therapy  BaiGaylord Shih/16/2021, 7:30 AM

## 2020-05-17 NOTE — IPOC Note (Signed)
Overall Plan of Care [facility]) Patient Details Name: Wanda Ramirez MRN: [id number] DOB: May 06, 1971  Admitting Diagnosis: [facility] Problems: Principal Problem:   Debility Active Problems:   End stage renal disease on dialysis ([place])   COVID-19   Pressure injury of skin   Sacral wound     Functional Problem List: Nursing Pain,Skin Integrity  PT Balance,Skin Integrity,Pain,Safety,Endurance,Motor  OT Endurance,Pain,Skin Integrity,Motor,Sensory  SLP    TR         Basic ADL's: OT Grooming,Bathing,Dressing,Toileting     Advanced  ADL's: OT Simple Meal Preparation,Light Housekeeping     Transfers: PT Bed Mobility,Bed to [employer]  OT Toilet,Tub/Shower     Locomotion: PT Ambulation,Wheelchair Mobility,Stairs     Additional Impairments: OT None  SLP        TR      Anticipated Outcomes Item Anticipated Outcome  Self Feeding ind  Swallowing      Basic self-care  sup  Toileting  sup   Bathroom Transfers sup  Bowel/Bladder  Continue to be continent.  Transfers  mod-I  Locomotion  supervision  Communication     Cognition     Pain  Maintain at 3 or less.  Safety/Judgment  No falls during stay   Therapy Plan: PT Intensity: Minimum of 1-2 x/day ,45 to 90 minutes PT Frequency: 5 out of 7 days PT Duration Estimated Length of Stay: 2-3 weeks OT Intensity: Minimum of 1-2 x/day, 45 to 90 minutes OT Frequency: 5 out of 7 days OT Duration/Estimated Length of Stay: 2 - 2.5 weeks SLP Duration/Estimated Length of Stay: n/a for ST (~2 weeks for PT/OT)   Due to the current state of emergency, patients may not be receiving their 3-hours of Medicare-mandated therapy.   Team Interventions: Nursing Interventions Pain Management,Skin Care/Wound Management,Psychosocial Support  PT interventions Ambulation/gait training,Cognitive remediation/compensation,Discharge planning,DME/adaptive equipment instruction,Functional mobility training,Pain  management,Psychosocial support,Splinting/orthotics,Therapeutic Activities,UE/LE Strength taining/ROM,Visual/perceptual remediation/compensation,Wheelchair propulsion/positioning,UE/LE Coordination activities,Therapeutic Exercise,Stair training,Skin care/wound management,Patient/family education,Neuromuscular re-education,Functional electrical stimulation,Disease management/prevention,Community Education officer, environmental  OT Interventions Balance/vestibular training,Community reintegration,Discharge planning,DME/adaptive equipment instruction,Functional mobility training,Disease mangement/prevention,Patient/family education,Self Care/advanced ADL retraining,Therapeutic Exercise,UE/LE Museum/gallery conservator propulsion/positioning,UE/LE Strength taining/ROM,Therapeutic Activities,Skin care/wound managment,Psychosocial support,Pain management  SLP Interventions    TR Interventions    SW/CM Interventions Discharge Planning,Psychosocial Support,Patient/Family Education   Barriers to Discharge MD  Medical stability, Wound care, Weight and Hemodialysis  Nursing Trach,Wound Care,Hemodialysis    PT Inaccessible home environment,Home environment access/layout,Trach,Wound Care,Insurance for SNF coverage pt has 12 stairs to reach her bedroom and bathroom on second level of home  OT Inaccessible home environment,Wound Care,Weight    SLP      SW Trach,Wound Care,Insurance for SNF coverage Insurance (cannot guarentee SNF or [facility] (SN/PT/OT/SPH), Patient ability to tolerate sitting 5 to 6 hours for OP HD and children's ability to care for wound   Team Discharge Planning: Destination: PT-Home ,OT- Home , SLP-Home Projected Follow-up: PT-Home health PT,Skilled nursing facility,24 hour supervision/assistance, OT-  Home health OT,24 hour supervision/assistance, SLP-None Projected Equipment Needs: PT-Wheelchair (measurements),To be determined,Rolling walker with 5" wheels,Wheelchair cushion  (measurements), OT- 3 in 1 bedside comode,Tub/shower bench, SLP-None recommended by SLP Equipment Details: PT-due to sacral wound, pt wil need ROHO air cushion at d/c in order to limit skin breakdown, OT-bariatric 3in1 Patient/family involved in discharge planning: PT- Patient,  OT-Patient, SLP-Patient  MD ELOS: 14-18d Medical Rehab Prognosis:  Good Assessment:  49 year old female with history of T2DM, ESRD-HD TTS,morbid obesity,chronic respiratory failure--3 L oxygen dependent who was admitted on 02/13/20 with acute on chronic respiratory  failure due toCovid19 PNA.She was treated with remdesiver, steroids, tocilizumab X 1 as well as broad spectrum antibiotics but developed lethargy with increase in oxygen needs due to ARDS and was intubated 09/26. V/Q scan negative for PE. Nephrology following for management ofHDand required CRRT due to hypotension. She has had issues with LUA AVF clotting requring TDC X 2(last by Dr. Anselm Pancoast on 09/28). She developed thrombocytopenia --HIT panel negative and was kept fully anticoagulated with IV heparin due to RV dysfunction and elevated D dimer. She was extubated on 10/06 but developed stridor with increased WOB and fever T-103 due to MSSA PNA requiring reintubation and ultimately required tracheostomy 10/18.   She was on tube feeds for nutritional support but developed diarrhea requiring flexiseal.She developedlarge stage IV left buttock/sacral wound with exposed sacrum and bloody drainage with anemia and fevers. She was taken to OR for I & D 11/02 by Dr. Bobbye Morton (4 units PRBC/500 cc albumin administered perioperatively). Woundcultures positive for enteroccocus faecalis and ESBL E coliand treated with Meropenum She continued to have loose stools with oozing requiring frequent dressing changes. Hydrotherapy ongoing and wound VAC placed briefly but wound started developing significant "C-diff type" odor with thick yellow drainage on 12/01. Wound VAC was removed  with dakins used for local care and as drainage resolved replaced 12/06 but VAC not maintaining seal due to significant amount of wound drainage. Decision made by WOC/CCS to discontinue VAC on 12/13 with moist damp to dressing changes for now. CCS recommends replacing wound VAC if drainage resolves --in approx a week or so?   Issues with delirium/metabolic encephalopathy has resolved. She was extubated to BIPAP, tolerated capping andto bedecannulated today. Dr. Tamala Julian recommends ENT referral if stoma does not close in 4 weeks. Hypotension being managed with midodrine on board and anemia of chronic illness being treated with aranesp --IV iron on hold due to infection. She is tolerating regular diet and showing improvement in activity tolerance. Therapy ongoing and CIR recommended due to functional decline.   See Team Conference Notes for weekly updates to the plan of care

## 2020-05-17 NOTE — Progress Notes (Signed)
Occupational Therapy Session Note  Patient Details  Name: [name] MRN: [id number] Date of Birth: April 08, 1971  Today's Date: 05/17/2020 OT Individual Time: 0700-0759 OT Individual Time Calculation (min): 59 min    Short Term Goals: Week 1:  OT Short Term Goal 1 (Week 1): Pt will don BLE into pants with min A. OT Short Term Goal 2 (Week 1): Pt will complete LB bathing with min A. OT Short Term Goal 3 (Week 1): Pt will complete sit<>stand in prep for standing ADLs with close supervision.  Skilled Therapeutic Interventions/Progress Updates:    1:1. Pt received in bed with "ok" pain in sacrum. Pt completes all stand pivot transfer with RW and VC for safe reach back to control decent to [facility] and w/c. Supine rest break in between transfers as OT retrieves portable O2 tank and more [place] tubing to ease toileting. Pt completes grooming at sink with A to open toothpaste cap d/t decreased grip strength. After grooming, pt reporting need to toilet. Pt stand pivot transfer to toilet with rapid decent to toilet. Pt STS from toilet for OT to completes clothing management for energy conservation. Pt has bowel and bladder movement on toilet. Total A for hygeiene in standing. Pt returns to EOB for dressing. UB dressing completed with set up. LB dressing completed with A to advance pants past hips d/t endurance deficits and body habitus. Supervision for all sitting balance. Exited session with pt seated in w/c, exit alarm on and call light in reach   Therapy Documentation Precautions:  Precautions Precautions: Fall,Other (comment) Precaution Comments: 3L O2 with mobility, sacral wound, trach Restrictions Weight Bearing Restrictions: No General:   Vital Signs: Therapy Vitals Temp: 97.7 F (36.5 C) Temp Source: Oral Pulse Rate: 90 Resp: 16 BP: 132/81 Patient Position (if appropriate): Lying Oxygen Therapy SpO2: 100 % O2 Device: Nasal Cannula O2 Flow Rate (L/min): 3 L/min Pain: Pain  Assessment Pain Scale: 0-10 Pain Score: Asleep ADL: ADL Eating: Set up Where Assessed-Eating: Chair Grooming: Supervision/safety Where Assessed-Grooming: Edge of bed Upper Body Bathing: Minimal assistance Where Assessed-Upper Body Bathing: Edge of bed Lower Body Bathing: Moderate assistance Where Assessed-Lower Body Bathing: Edge of bed Upper Body Dressing: Supervision/safety Where Assessed-Upper Body Dressing: Edge of bed Lower Body Dressing: Moderate assistance Where Assessed-Lower Body Dressing: Edge of bed Toileting: Maximal assistance Where Assessed-Toileting: Bedside Commode Toilet Transfer: Moderate assistance Toilet Transfer Method: Stand pivot Science writer: Extra wide Radiographer, therapeutic: Not assessed Social research officer, government: Not assessed Vision   Perception    Praxis   Exercises:   Other Treatments:     Therapy/Group: Individual Therapy  Tonny Branch 05/17/2020, 6:44 AM

## 2020-05-17 NOTE — Progress Notes (Signed)
Physical Therapy Session Note  Patient Details  Name: Wanda Ramirez MRN: [id number] Date of Birth: 12/18/1970  Today's Date: 05/17/2020 PT Individual Time: 1100-1154 PT Individual Time Calculation (min): 54 min   Short Term Goals: Week 1:  PT Short Term Goal 1 (Week 1): pt will perform all transfers with CGA PT Short Term Goal 2 (Week 1): pt will perform bed mobility with CGA PT Short Term Goal 3 (Week 1): pt will ambulate ~66ft with CGA and LRAD PT Short Term Goal 4 (Week 1): pt will initiate stair training  Skilled Therapeutic Interventions/Progress Updates:   Received pt supine in bed, pt agreeable to therapy, and reported pain from wound on buttocks otherwise did not c/o pain. Session with emphasis on functional mobility/transfers, generalized strengthening, dynamic standing balance/coordinaiton, ambulation, and improved endurance with activity. Pt on 3L O2 via [place] with O2 sat 99%. Pt performed bed mobility with supervision, increased time, and use of bedrails x 2 trials throughout session. Pt transferred bed<>WC stand<>pivot without AD and min A and performed WC mobility 168ft using BUE and supervision to dayroom with max cues for propulsion technique. Pt ambulated 71ft x 2 trials and 7ft x 2 trials with RW and min A. Pt demonstrated flexed trunk, narrow NOS, and decreased stride length requiring cues for upright posture and pursed lip breathing. Worked on dynamic standing balance playing cornhole with RW and CGA x 3 trials with rest breaks in between each round. Pt transferred sit<>stand without AD and CGA 3x6 reps with cues for anterior weight shifting and for trunk extension in standing. O2 sat 94% after activity. Pt transported back to room and transferred WC<>bed stand<>pivot with RW and CGA. Concluded session with pt supine in bed, needs within reach, and NT present at bedside attending to care.   Therapy Documentation Precautions:  Precautions Precautions: Fall,Other  (comment) Precaution Comments: 3L O2 with mobility, sacral wound, trach Restrictions Weight Bearing Restrictions: No  Therapy/Group: Individual Therapy Alfonse Alpers PT, DPT   05/17/2020, 7:41 AM

## 2020-05-17 NOTE — Progress Notes (Signed)
[place] PHYSICAL MEDICINE & REHABILITATION PROGRESS NOTE   Subjective/Complaints: No complaints this morning When asked, she does report shortness of breath- a little at rest, and more with activity.  Denies swelling in extremities, chest pain.   ROS- deniesCP, SOB, N/V/D  Objective:   No results found. Recent Labs    05/15/20 1217  WBC 13.6*  HGB 7.2*  HCT 23.3*  PLT 421*   Recent Labs    05/15/20 0329  NA 137  K 5.9*  CL 100  CO2 22  GLUCOSE 115*  BUN 90*  CREATININE 7.41*  CALCIUM 10.4*    Intake/Output Summary (Last 24 hours) at 05/17/2020 1016 Last data filed at 05/17/2020 0900 Gross per 24 hour  Intake 320 ml  Output --  Net 320 ml     Pressure Injury 03/06/20 Sacrum Mid Stage 4 - Full thickness tissue loss with exposed bone, tendon or muscle. full thickness tissue loss. base of wound is red with big skin flap coverings as a lid. wound is tunneling at 3 o'clock, 7 o'clock, and 9 o'cloc (Active)  03/06/20 0000  Location: Sacrum  Location Orientation: Mid  Staging: Stage 4 - Full thickness tissue loss with exposed bone, tendon or muscle.  Wound Description (Comments): full thickness tissue loss. base of wound is red with big skin flap coverings as a lid. wound is tunneling at 3 o'clock, 7 o'clock, and 9 o'clock  Present on Admission: No     Pressure Injury 05/15/20 Buttocks Left Stage 2 -  Partial thickness loss of dermis presenting as a shallow open injury with a red, pink wound bed without slough. 2 shallow open areas on the left lateral buttock without slough 1 of 2 (Active)  05/15/20 1700  Location: Buttocks  Location Orientation: Left  Staging: Stage 2 -  Partial thickness loss of dermis presenting as a shallow open injury with a red, pink wound bed without slough.  Wound Description (Comments): 2 shallow open areas on the left lateral buttock without slough 1 of 2  Present on Admission: Yes     Pressure Injury 05/15/20 Buttocks Left Stage 2 -   Partial thickness loss of dermis presenting as a shallow open injury with a red, pink wound bed without slough. 2 shallow open areas on the left lateral buttock without slough 2 of 2 (Active)  05/15/20 1700  Location: Buttocks  Location Orientation: Left  Staging: Stage 2 -  Partial thickness loss of dermis presenting as a shallow open injury with a red, pink wound bed without slough.  Wound Description (Comments): 2 shallow open areas on the left lateral buttock without slough 2 of 2  Present on Admission: Yes    Physical Exam: Vital Signs Blood pressure 132/81, pulse 90, temperature 97.7 F (36.5 C), temperature source Oral, resp. rate 16, height 5\' 2"  (1.575 m), weight 90.7 kg, SpO2 100 %. HEENT: healed trach    Gen: no distress, normal appearing HEENT: oral mucosa pink and moist, NCAT Cardio: Reg rate Chest: labored breathing, Gregory at 2L, satting 100% Abd: soft, non-distended Ext: no edema  Skin: Sacral decubitus    Neurologic: Cranial nerves II through XII intact, motor strength is 4/5 in bilateral deltoid, bicep, tricep, grip, hip flexor, knee extensors, ankle dorsiflexor and plantar flexor Sensory exam able to sense LT but reports numbness RIght little finger and Left toes Cerebellar exam normal finger to nose to finger as well as heel to shin in bilateral upper and lower extremities Musculoskeletal: Full range of  motion in all 4 extremities. No joint swelling    Assessment/Plan: 1. Functional deficits which require 3+ hours per day of interdisciplinary therapy in a comprehensive inpatient rehab setting.  Physiatrist is providing close team supervision and 24 hour management of active medical problems listed below.  Physiatrist and rehab team continue to assess barriers to discharge/monitor patient progress toward functional and medical goals  Care Tool:  Bathing    Body parts bathed by patient: Face,Left lower leg,Right lower leg,Left upper leg,Left arm,Right  arm,Right upper leg,Chest,Front perineal area,Abdomen   Body parts bathed by helper: Buttocks     Bathing assist Assist Level: Moderate Assistance - Patient 50 - 74%     Upper Body Dressing/Undressing Upper body dressing   What is the patient wearing?: Hospital gown only    Upper body assist Assist Level: Contact Guard/Touching assist    Lower Body Dressing/Undressing Lower body dressing      What is the patient wearing?: Incontinence brief,Pants     Lower body assist Assist for lower body dressing: Moderate Assistance - Patient 50 - 74%     Toileting Toileting    Toileting assist Assist for toileting: Maximal Assistance - Patient 25 - 49%     Transfers Chair/bed transfer  Transfers assist     Chair/bed transfer assist level: Minimal Assistance - Patient > 75%     Locomotion Ambulation   Ambulation assist      Assist level: Minimal Assistance - Patient > 75% Assistive device: Walker-rolling Max distance: 39ft   Walk 10 feet activity   Assist     Assist level: Minimal Assistance - Patient > 75% Assistive device: Walker-rolling   Walk 50 feet activity   Assist Walk 50 feet with 2 turns activity did not occur: Safety/medical concerns         Walk 150 feet activity   Assist Walk 150 feet activity did not occur: Safety/medical concerns         Walk 10 feet on uneven surface  activity   Assist Walk 10 feet on uneven surfaces activity did not occur: Safety/medical concerns         Wheelchair     Assist Will patient use wheelchair at discharge?: Yes Type of Wheelchair: Manual    Wheelchair assist level: Minimal Assistance - Patient > 75% Max wheelchair distance: 138ft    Wheelchair 50 feet with 2 turns activity    Assist        Assist Level: Minimal Assistance - Patient > 75%   Wheelchair 150 feet activity     Assist      Assist Level: Moderate Assistance - Patient 50 - 74%   Blood pressure 132/81,  pulse 90, temperature 97.7 F (36.5 C), temperature source Oral, resp. rate 16, height 5\' 2"  (1.575 m), weight 90.7 kg, SpO2 100 %.    Medical Problem List and Plan: 1.  Functional and mobility deficits secondary to debility after COVID and associated complications including VDRF/trach and large sacral wound Has neuropathy, mononeuropathy multiplex pattern, no sig neurogenic pain             -patient may not yet shower             -ELOS/Goals: 14-21 days, mod I PT, min assist OT, mod I SLP  Continue CIR 2.  Antithrombotics: -DVT/anticoagulation:  Pharmaceutical: Heparin             -antiplatelet therapy: N/A 3. Pain Management: Continue Oxycodone prn.  4. Mood: LCSW to follow  for evaluation and support.              -antipsychotic agents: N/A 5. Neuropsych: This patient is capable of making decisions on her own behalf--dicussed code status -->SHE WANTS TO BE FULL CODE . 6.Large stage IV sacral/buttock wound/Wound Care: Meropenum completed 05/13/20.     -Juven tid, glucerna tid and vitamin B w/C.               -Santyl to eschar on inferior flap and wet to dry dressing changes bid and prn to keep wound clean of stool to prevent contamination.              -Pressure relief measure with air mattress.                -WBC trending upwards--recheck in am  7. Fluids/Electrolytes/Nutrition: Renal diet with 1200 cc FR. Strict I/O with daily weights.              -check labs with HD 8.T2DM: On glucerna tid with meals. Monitor BS ac/hs--On Lantus 3 units daily with SSI for elevated BS.  CBG (last 3)  Recent Labs    05/16/20 1624 05/16/20 2121 05/17/20 0621  GLUCAP 158* 173* 116*  elevated 12/16: continue to monitor.  9. Hypotension: Monitor BP tid--especially with increase in activity. Continue Midodrine 10 mg tid.  Vitals:   05/16/20 2029 05/17/20 0454  BP: 130/70 132/81  Pulse: 96 90  Resp: 18 16  Temp: 98.2 F (36.8 C) 97.7 F (36.5 C)  SpO2: 100% 100%  BP stable, will continue to  monitor on Midodrine, nephro adjusting HD  Hypthyroid: Stable on supplement.  10. ESRD:  HD -TTS with nephrology to assist with management. Schedule HD at the end of the day to help with tolerance of activity. Sevelmar tid for metabolic bone disease. Hyperkalemia managed with HD. 11. Anemia of critical illness/anemia of CKD: On Aranesp 150 mc/week. Hgb stable at 7.2 range past week.  Transfuse prn Hgb<7.0.   12.  Anxiety: Continue low dose klonopin in am and Seroquel at nights to help with sleep.  13. Leukocytosis.: Given SOB, will order CXR 2V   LOS: 2 days A FACE TO FACE EVALUATION WAS PERFORMED  Martha Clan P Sirenia Whitis 05/17/2020, 10:16 AM

## 2020-05-17 NOTE — Progress Notes (Signed)
Patient ID: Wanda Ramirez, female   DOB: Sep 27, 1970, 49 y.o.   MRN: [id number]   SW was able to follow up with pt daughter. Provided contact information. Informed of current barriers (insurance for SNF and HH/Wound/HD) presented and addressed questions and concerns.   [place], [place]

## 2020-05-18 ENCOUNTER — Inpatient Hospital Stay (HOSPITAL_COMMUNITY): Payer: Medicaid Other | Admitting: Physical Therapy

## 2020-05-18 ENCOUNTER — Inpatient Hospital Stay (HOSPITAL_COMMUNITY): Payer: Medicaid Other

## 2020-05-18 LAB — URINALYSIS, ROUTINE W REFLEX MICROSCOPIC
Bilirubin Urine: NEGATIVE
Glucose, UA: 150 mg/dL — AB
Hgb urine dipstick: NEGATIVE
Ketones, ur: NEGATIVE mg/dL
Nitrite: NEGATIVE
Protein, ur: 100 mg/dL — AB
Specific Gravity, Urine: 1.015 (ref 1.005–1.030)
WBC, UA: >50 WBC/hpf — ABNORMAL HIGH (ref 0–5)
pH: 6 (ref 5.0–8.0)

## 2020-05-18 LAB — GLUCOSE, CAPILLARY
Glucose-Capillary: 181 mg/dL — ABNORMAL HIGH (ref 70–99)
Glucose-Capillary: 191 mg/dL — ABNORMAL HIGH (ref 70–99)
Glucose-Capillary: 176 mg/dL — ABNORMAL HIGH (ref 70–99)
Glucose-Capillary: 178 mg/dL — ABNORMAL HIGH (ref 70–99)

## 2020-05-18 MED ORDER — ONDANSETRON HCL 4 MG PO TABS: 4.0000 mg | ORAL_TABLET | Freq: Three times a day (TID) | ORAL | Status: DC | PRN

## 2020-05-18 MED ORDER — INSULIN GLARGINE 100 UNIT/ML ~~LOC~~ SOLN
4.0000 [IU] | Freq: Every day | SUBCUTANEOUS | Status: DC
  Administered 2020-05-18 – 2020-05-29 (×12): 4 [IU] via SUBCUTANEOUS
  Filled 2020-05-18 (×14): qty 0.04

## 2020-05-18 MED ORDER — PROCHLORPERAZINE MALEATE 5 MG PO TABS: 5.0000 mg | ORAL_TABLET | Freq: Four times a day (QID) | ORAL | Status: DC | PRN

## 2020-05-18 MED ORDER — ONDANSETRON HCL 4 MG PO TABS
4.0000 mg | ORAL_TABLET | Freq: Three times a day (TID) | ORAL | Status: DC | PRN
  Administered 2020-05-18 – 2020-05-22 (×2): 4 mg via ORAL
  Filled 2020-05-18 (×2): qty 1

## 2020-05-18 MED ORDER — PROCHLORPERAZINE 25 MG RE SUPP: 12.5000 mg | Freq: Four times a day (QID) | RECTAL | Status: DC | PRN

## 2020-05-18 NOTE — Progress Notes (Signed)
Occupational Therapy Session Note  Patient Details  Name: Wanda Ramirez MRN: [id number] Date of Birth: Aug 18, 1970  Today's Date: 05/18/2020 OT Individual Time: 1500-1600 OT Individual Time Calculation (min): 60 min    Short Term Goals: Week 1:  OT Short Term Goal 1 (Week 1): Pt will don BLE into pants with min A. OT Short Term Goal 2 (Week 1): Pt will complete LB bathing with min A. OT Short Term Goal 3 (Week 1): Pt will complete sit<>stand in prep for standing ADLs with close supervision.  Skilled Therapeutic Interventions/Progress Updates:    1:1. Pt received in bed agreeable to OT  but anticipating son arrival. Pt vitals assessed as pt not wearing supp O2 via [place] when sleeping prior to OT arrival. O2 measured at 95% at rest. Pt requesting to call son to alert to tx time. Pt completes stand pivot transfer OOB to w/c with RW with MIN A for power up. RA utilized throughotu session with pt >93%. Pt completes 3x30 ball toss (chest, bounce, overhead pass) in seated position with 1.5 # wrist weights to improve BUE coordination/strengthening required for BADLs/functional transfers. Pt complete each ball volley with 4# dowel rod seated in w/c with ace wrap around R hand to facilitate grasp 4x45 second intervals to improve BUE strength required for BADLs and transfers. Exited session with pt seated in bed, exit alarm on and call light in reach   Therapy Documentation Precautions:  Precautions Precautions: Fall,Other (comment) Precaution Comments: 3L O2 with mobility, sacral wound, trach Restrictions Weight Bearing Restrictions: No General:   Vital Signs: Therapy Vitals Temp: 98.6 F (37 C) Temp Source: Oral Pulse Rate: 91 Resp: 14 BP: (!) 96/55 Patient Position (if appropriate): Lying Oxygen Therapy SpO2: 100 % O2 Device: Room Air Pain:   ADL: ADL Eating: Set up Where Assessed-Eating: Chair Grooming: Supervision/safety Where Assessed-Grooming: Edge of bed Upper Body Bathing:  Minimal assistance Where Assessed-Upper Body Bathing: Edge of bed Lower Body Bathing: Moderate assistance Where Assessed-Lower Body Bathing: Edge of bed Upper Body Dressing: Supervision/safety Where Assessed-Upper Body Dressing: Edge of bed Lower Body Dressing: Moderate assistance Where Assessed-Lower Body Dressing: Edge of bed Toileting: Maximal assistance Where Assessed-Toileting: Bedside Commode Toilet Transfer: Moderate assistance Toilet Transfer Method: Stand pivot Science writer: Extra wide Radiographer, therapeutic: Not assessed Social research officer, government: Not assessed Vision   Perception    Praxis   Exercises:   Other Treatments:     Therapy/Group: Individual Therapy  Tonny Branch 05/18/2020, 3:02 PM

## 2020-05-18 NOTE — Progress Notes (Addendum)
Physical Therapy Session Note  Patient Details  Name: Wanda Ramirez MRN: [id number] Date of Birth: 07/14/1970  Today's Date: 05/18/2020 PT Individual Time: 1300-1400 1015-1100 PT Individual Time Calculation (min): 60 min   Short Term Goals: Week 1:  PT Short Term Goal 1 (Week 1): pt will perform all transfers with CGA PT Short Term Goal 2 (Week 1): pt will perform bed mobility with CGA PT Short Term Goal 3 (Week 1): pt will ambulate ~61f with CGA and LRAD PT Short Term Goal 4 (Week 1): pt will initiate stair training  Skilled Therapeutic Interventions/Progress Updates:    Session 1:  Pt received in R sidelying, immediately following sacral wound dressing change by nursing missing 15 min PT therapy . Pt came to sit on L EOB with min assist due to pain in buttocks. Sit>stand with min assist to RW, then stand pivot transfer to wc on R side with CGA. Pt transported to day room total assist for time management and energy conservation.   Gait: -pt ambulated ~269f 2060fand 5ft67fth RW and CGA. Pt able to verbalize needing to sit down each time prior to max fatigue. Pt demonstrates anterior trunk lean, narrow BOS and shortened step length with gait.  Stair Training: -1x3, 2x5 step ups with L foot up and R foot down due to noted knee pain in R knee (4/10): in RW for support. Pt able to complete with CGA and BUE support. Pt fatigues quickly, however if provided with a goal will do her best to complete the task.   WC Propulsion: -pt propelled wc ~125ft70froom with supervision and heavy verbal cueing in order to properly sequence. Pt attempts to alternate hands when propelling wc and needs correction.   Pt left sitting in wc with needs met, call bell in reach, and needs met.   Throughout session, pt on 2L O2 for mobility. O2 sats maintained at 100.  Session 2: Pt received in R sidelying and agreeable to PT. Pt came to sit on L EOB with CGA despite pain in buttocks (8/10). Sit>stand with  min assist to RW, then stand pivot transfer to wc on R side with CGA. Pt transported to gym total assist for time management and energy conservation.  Standing Tolerance: -pt participated in tasks at BITS:Ettrickory x2 and shape tracing x2 for 30sec each. Pt unable to maintain standing for >30sec due to fatigue. During last bout of tracing, pt attempted to complete 30sec, but placed L elbow on RW for support, then returned to sitting.   Arm Ergometer: -pt completed 2x3min 60mts @ level 4 for endurance without change in O2 sats. Rest break provided between bouts.   Gait: -pt ambulated with CGA and RW ~28ft a47f0ft. [id number] to push through fatigue to reach 30ft whe33fovided with an external target for distance.   Pt transported back to room total assist and performed stand pivot transfer with RW back to bedside. Pt left sitting at EOB with bed alarm on, needs met, and call bell in reach.   Throughout session, pt on 2L O2 for mobility. O2 sats maintained at 100.  Therapy Documentation Precautions:  Precautions Precautions: Fall,Other (comment) Precaution Comments: 3L O2 with mobility, sacral wound, trach Restrictions Weight Bearing Restrictions: No   Therapy/Group: Individual Therapy  Parke Jandreau ClGaylord Shih21, 1:03 PM

## 2020-05-18 NOTE — Progress Notes (Signed)
[place] PHYSICAL MEDICINE & REHABILITATION PROGRESS NOTE   Subjective/Complaints: No issues recorded overnight. No complaints this morning. Denies pain, constipation, insomnia.  Denies SOB  ROS- deniesCP, SOB, N/V/D  Objective:   DG Chest 2 View  Result Date: 05/17/2020 CLINICAL DATA:  Leukocytosis EXAM: CHEST - 2 VIEW COMPARISON:  04/03/2020 FINDINGS: Previously noted tracheostomy and nasoenteric feeding tube have been removed. Left internal jugular hemodialysis catheter tip is unchanged within the right atrium. Mild right-sided volume loss is noted. Previously noted extensive asymmetric right-sided pulmonary infiltrate has significantly improved with minimal residual infiltrate noted within the right lung. Mild right basilar parenchymal scarring is noted. Left lung is clear. No pneumothorax or pleural effusion. Cardiac size is within normal limits. No acute bone abnormality. IMPRESSION: Significantly improved right lung pulmonary infiltrate with minimal residual infiltrate noted. Superimposed parenchymal scarring and mild right-sided volume loss now present. Electronically Signed   By: Fidela Salisbury MD   On: 05/17/2020 20:36   Recent Labs    05/15/20 1217 05/17/20 1528  WBC 13.6* 14.0*  HGB 7.2* 7.3*  HCT 23.3* 23.1*  PLT 421* 380   Recent Labs    05/17/20 1528  NA 136  K 5.2*  CL 93*  CO2 25  GLUCOSE 165*  BUN 81*  CREATININE 7.54*  CALCIUM 10.8*    Intake/Output Summary (Last 24 hours) at 05/18/2020 0609 Last data filed at 05/17/2020 1755 Gross per 24 hour  Intake 360 ml  Output 2027 ml  Net -1667 ml     Pressure Injury 03/06/20 Sacrum Mid Stage 4 - Full thickness tissue loss with exposed bone, tendon or muscle. full thickness tissue loss. base of wound is red with big skin flap coverings as a lid. wound is tunneling at 3 o'clock, 7 o'clock, and 9 o'cloc (Active)  03/06/20 0000  Location: Sacrum  Location Orientation: Mid  Staging: Stage 4 - Full thickness  tissue loss with exposed bone, tendon or muscle.  Wound Description (Comments): full thickness tissue loss. base of wound is red with big skin flap coverings as a lid. wound is tunneling at 3 o'clock, 7 o'clock, and 9 o'clock  Present on Admission: No     Pressure Injury 05/15/20 Buttocks Left Stage 2 -  Partial thickness loss of dermis presenting as a shallow open injury with a red, pink wound bed without slough. 2 shallow open areas on the left lateral buttock without slough 1 of 2 (Active)  05/15/20 1700  Location: Buttocks  Location Orientation: Left  Staging: Stage 2 -  Partial thickness loss of dermis presenting as a shallow open injury with a red, pink wound bed without slough.  Wound Description (Comments): 2 shallow open areas on the left lateral buttock without slough 1 of 2  Present on Admission: Yes     Pressure Injury 05/15/20 Buttocks Left Stage 2 -  Partial thickness loss of dermis presenting as a shallow open injury with a red, pink wound bed without slough. 2 shallow open areas on the left lateral buttock without slough 2 of 2 (Active)  05/15/20 1700  Location: Buttocks  Location Orientation: Left  Staging: Stage 2 -  Partial thickness loss of dermis presenting as a shallow open injury with a red, pink wound bed without slough.  Wound Description (Comments): 2 shallow open areas on the left lateral buttock without slough 2 of 2  Present on Admission: Yes    Physical Exam: Vital Signs Blood pressure 91/63, pulse 100, temperature 98.7 F (37.1 C), temperature  source Oral, resp. rate 18, height 5\' 2"  (1.575 m), weight 93.4 kg, SpO2 100 %. HEENT: healed trach    Gen: no distress, normal appearing HEENT: oral mucosa pink and moist, NCAT Cardio: Reg rate Chest: normal effort, normal rate of breathing Abd: soft, non-distended Ext: no edema  Skin: Sacral decubitus    Neurologic: Cranial nerves II through XII intact, motor strength is 4/5 in bilateral deltoid, bicep,  tricep, grip, hip flexor, knee extensors, ankle dorsiflexor and plantar flexor Sensory exam able to sense LT but reports numbness RIght little finger and Left toes Cerebellar exam normal finger to nose to finger as well as heel to shin in bilateral upper and lower extremities Musculoskeletal: Full range of motion in all 4 extremities. No joint swelling    Assessment/Plan: 1. Functional deficits which require 3+ hours per day of interdisciplinary therapy in a comprehensive inpatient rehab setting.  Physiatrist is providing close team supervision and 24 hour management of active medical problems listed below.  Physiatrist and rehab team continue to assess barriers to discharge/monitor patient progress toward functional and medical goals  Care Tool:  Bathing    Body parts bathed by patient: Face,Left lower leg,Right lower leg,Left upper leg,Left arm,Right arm,Right upper leg,Chest,Front perineal area,Abdomen   Body parts bathed by helper: Buttocks     Bathing assist Assist Level: Moderate Assistance - Patient 50 - 74%     Upper Body Dressing/Undressing Upper body dressing   What is the patient wearing?: Hospital gown only    Upper body assist Assist Level: Contact Guard/Touching assist    Lower Body Dressing/Undressing Lower body dressing      What is the patient wearing?: Incontinence brief,Pants     Lower body assist Assist for lower body dressing: Moderate Assistance - Patient 50 - 74%     Toileting Toileting    Toileting assist Assist for toileting: Maximal Assistance - Patient 25 - 49%     Transfers Chair/bed transfer  Transfers assist     Chair/bed transfer assist level: Minimal Assistance - Patient > 75%     Locomotion Ambulation   Ambulation assist      Assist level: Minimal Assistance - Patient > 75% Assistive device: Walker-rolling Max distance: 37ft   Walk 10 feet activity   Assist     Assist level: Minimal Assistance - Patient >  75% Assistive device: Walker-rolling   Walk 50 feet activity   Assist Walk 50 feet with 2 turns activity did not occur: Safety/medical concerns         Walk 150 feet activity   Assist Walk 150 feet activity did not occur: Safety/medical concerns         Walk 10 feet on uneven surface  activity   Assist Walk 10 feet on uneven surfaces activity did not occur: Safety/medical concerns         Wheelchair     Assist Will patient use wheelchair at discharge?: Yes Type of Wheelchair: Manual    Wheelchair assist level: Supervision/Verbal cueing Max wheelchair distance: 122ft    Wheelchair 50 feet with 2 turns activity    Assist        Assist Level: Supervision/Verbal cueing   Wheelchair 150 feet activity     Assist      Assist Level: Moderate Assistance - Patient 50 - 74%   Blood pressure 91/63, pulse 100, temperature 98.7 F (37.1 C), temperature source Oral, resp. rate 18, height 5\' 2"  (1.575 m), weight 93.4 kg, SpO2 100 %.  Medical Problem List and Plan: 1.  Functional and mobility deficits secondary to debility after COVID and associated complications including VDRF/trach and large sacral wound Has neuropathy, mononeuropathy multiplex pattern, no sig neurogenic pain             -patient may not yet shower             -ELOS/Goals: 14-21 days, mod I PT, min assist OT, mod I SLP  Continue CIR 2.  Antithrombotics: -DVT/anticoagulation:  Pharmaceutical: Heparin             -antiplatelet therapy: N/A 3. Pain Management: Continue Oxycodone prn.  4. Mood: LCSW to follow for evaluation and support.              -antipsychotic agents: N/A 5. Neuropsych: This patient is capable of making decisions on her own behalf--dicussed code status -->SHE WANTS TO BE FULL CODE . 6.Large stage IV sacral/buttock wound/Wound Care: Meropenum completed 05/13/20.     -Juven tid, glucerna tid and vitamin B w/C.               -Santyl to eschar on inferior flap and  wet to dry dressing changes bid and prn to keep wound clean of stool to prevent contamination.              -Pressure relief measure with air mattress.                -WBC trending upwards--recheck in am  7. Fluids/Electrolytes/Nutrition: Renal diet with 1200 cc FR. Strict I/O with daily weights.              -check labs with HD 8.T2DM: On glucerna tid with meals. Monitor BS ac/hs--On Lantus 3 units daily with SSI for elevated BS.  CBG (last 3)  Recent Labs    05/17/20 1157 05/17/20 1822 05/17/20 2050  GLUCAP 186* 188* 292*  elevated 12/17 to 292: increase lantus to 4U  9. Hypotension: Monitor BP tid--especially with increase in activity. Continue Midodrine 10 mg tid.  Vitals:   05/17/20 1755 05/17/20 1825  BP: (!) 97/59 91/63  Pulse:  100  Resp: 18 18  Temp: 98.6 F (37 C) 98.7 F (37.1 C)  SpO2: 100% 100%  BP soft, will continue to monitor on Midodrine, nephro adjusting HD  Hypthyroid: Stable on supplement.  10. ESRD:  HD -TTS with nephrology to assist with management. Schedule HD at the end of the day to help with tolerance of activity. Sevelmar tid for metabolic bone disease. Hyperkalemia managed with HD. 11. Anemia of critical illness/anemia of CKD: On Aranesp 150 mc/week. Hgb stable at 7.2 range past week.  Transfuse prn Hgb<7.0.   12.  Anxiety: Continue low dose klonopin in am and Seroquel at nights to help with sleep.  13. Leukocytosis.: Given SOB, ordered CXR 12/16 which was stable. WBC increased further to 14 on 12/17. UA/UC ordered and repeat CBC ordered for 12/18   LOS: 3 days A FACE TO FACE EVALUATION WAS PERFORMED  Tekela Garguilo P Erlene Devita 05/18/2020, 6:09 AM

## 2020-05-19 ENCOUNTER — Inpatient Hospital Stay (HOSPITAL_COMMUNITY): Payer: Medicaid Other | Admitting: Speech Pathology

## 2020-05-19 ENCOUNTER — Inpatient Hospital Stay (HOSPITAL_COMMUNITY): Payer: Medicaid Other | Admitting: Physical Therapy

## 2020-05-19 ENCOUNTER — Inpatient Hospital Stay (HOSPITAL_COMMUNITY): Payer: Medicaid Other

## 2020-05-19 LAB — GLUCOSE, CAPILLARY
Glucose-Capillary: 138 mg/dL — ABNORMAL HIGH (ref 70–99)
Glucose-Capillary: 171 mg/dL — ABNORMAL HIGH (ref 70–99)
Glucose-Capillary: 190 mg/dL — ABNORMAL HIGH (ref 70–99)

## 2020-05-19 LAB — HEPATITIS B SURFACE ANTIGEN: Hepatitis B Surface Ag: NONREACTIVE

## 2020-05-19 LAB — CBC WITH DIFFERENTIAL/PLATELET
Abs Immature Granulocytes: 0.05 10*3/uL (ref 0.00–0.07)
Basophils Absolute: 0.1 10*3/uL (ref 0.0–0.1)
Basophils Relative: 1 %
Eosinophils Absolute: 0.3 10*3/uL (ref 0.0–0.5)
Eosinophils Relative: 3 %
HCT: 24.9 % — ABNORMAL LOW (ref 36.0–46.0)
Hemoglobin: 8 g/dL — ABNORMAL LOW (ref 12.0–15.0)
Immature Granulocytes: 1 %
Lymphocytes Relative: 32 %
Lymphs Abs: 3.5 10*3/uL (ref 0.7–4.0)
MCH: 27.8 pg (ref 26.0–34.0)
MCHC: 32.1 g/dL (ref 30.0–36.0)
MCV: 86.5 fL (ref 80.0–100.0)
Monocytes Absolute: 1.3 10*3/uL — ABNORMAL HIGH (ref 0.1–1.0)
Monocytes Relative: 12 %
Neutro Abs: 5.5 10*3/uL (ref 1.7–7.7)
Neutrophils Relative %: 51 %
Platelets: 346 10*3/uL (ref 150–400)
RBC: 2.88 MIL/uL — ABNORMAL LOW (ref 3.87–5.11)
RDW: 15.1 % (ref 11.5–15.5)
WBC: 10.8 10*3/uL — ABNORMAL HIGH (ref 4.0–10.5)
nRBC: 0 % (ref 0.0–0.2)

## 2020-05-19 LAB — RENAL FUNCTION PANEL
Albumin: 2.6 g/dL — ABNORMAL LOW (ref 3.5–5.0)
Anion gap: 17 — ABNORMAL HIGH (ref 5–15)
BUN: 63 mg/dL — ABNORMAL HIGH (ref 6–20)
CO2: 24 mmol/L (ref 22–32)
Calcium: 10.3 mg/dL (ref 8.9–10.3)
Chloride: 91 mmol/L — ABNORMAL LOW (ref 98–111)
Creatinine, Ser: 7.17 mg/dL — ABNORMAL HIGH (ref 0.44–1.00)
GFR, Estimated: 6 mL/min — ABNORMAL LOW (ref >60)
Glucose, Bld: 214 mg/dL — ABNORMAL HIGH (ref 70–99)
Phosphorus: 5.4 mg/dL — ABNORMAL HIGH (ref 2.5–4.6)
Potassium: 5.4 mmol/L — ABNORMAL HIGH (ref 3.5–5.1)
Sodium: 132 mmol/L — ABNORMAL LOW (ref 135–145)

## 2020-05-19 MED ORDER — HEPARIN SODIUM (PORCINE) 1000 UNIT/ML IJ SOLN
INTRAMUSCULAR | Status: AC
  Administered 2020-05-19: 4200 [IU]
  Filled 2020-05-19: qty 1

## 2020-05-19 NOTE — Progress Notes (Addendum)
Physical Therapy Session Note  Patient Details  Name: Wanda Ramirez MRN: [id number] Date of Birth: November 25, 1970  Today's Date: 05/19/2020 PT Individual Time: 0810-0905 PT Individual Time Calculation (min): 55 min   Short Term Goals: Week 1:  PT Short Term Goal 1 (Week 1): pt will perform all transfers with CGA PT Short Term Goal 2 (Week 1): pt will perform bed mobility with CGA PT Short Term Goal 3 (Week 1): pt will ambulate ~85f with CGA and LRAD PT Short Term Goal 4 (Week 1): pt will initiate stair training  Skilled Therapeutic Interventions/Progress Updates:   Pt received sitting EOB and agreeable to PT, once she completed breakfast. Pt performed lower body dressing sitting EOB with supervision assist. Sit<>stand with CGA to pull pants up to waist with CGA for balance with no UE support. Pt able to don R sock with supervision assist and total A on the L due to hip tightness.   Throughout session pt performed sit<>stand and stand pivot transfer with CGA with UE supported on RW, min cues to push from sitting surface intermittently.   Gait training with RW 2x 12, CGA from PT with no LOB noted throughout. Min cues for Awareness of O2 line and RW management in turns.   Pt performed seated BLE therex:  LAQ, 3# ankle weight x10 Hip flexion 3# ankle weight x 10  Hip abduction  level 1 tband doubled x 12 Isometric hip adduction level 1 tband doubled x 12 Hip extension from seated position level 1 tband doubled x 10 Calf raise 3# ankle weight x 15 Hs curl level 1 tband doubled x 8 Pt required min cues decreased of movement throughout to improved strengthen aspect of movements in eccentric phase.   WC mobility with supervision assist x 1016fwith min cues for symmetry of movement in BUE. Pt self select to use reciprocal pattern regardless of cues from PT.   [facility] and all needs met.     Session 2  Per RN pt preparing for  dialysis. Will re-attempt at later time/date. Missed 30 min skilled PT.      Therapy Documentation Precautions:  Precautions Precautions: Fall,Other (comment) Precaution Comments: 3L O2 with mobility, sacral wound, trach Restrictions Weight Bearing Restrictions: No Vital Signs: Therapy Vitals Temp: 98.5 F (36.9 C) Temp Source: Oral Pulse Rate: 92 Resp: 18 BP: 101/67 Patient Position (if appropriate): Lying Oxygen Therapy SpO2: 96 % O2 Device: Room Air Pain:    8/10 L buttock at wound site. Pt repositioned, declines pain meds at this time.    Therapy/Group: Individual Therapy  AuLorie Phenix2/18/2021, 9:18 AM

## 2020-05-19 NOTE — Progress Notes (Signed)
Occupational Therapy Session Note  Patient Details  Name: Wanda Ramirez MRN: [id number] Date of Birth: 1970/11/30  Today's Date: 05/19/2020 OT Individual Time: 0930-1000 OT Individual Time Calculation (min): 30 min  and Today's Date: 05/19/2020 OT Missed Time: 30 Minutes Missed Time Reason: Unavailable (comment) (dialysis)   Short Term Goals: Week 1:  OT Short Term Goal 1 (Week 1): Pt will don BLE into pants with min A. OT Short Term Goal 2 (Week 1): Pt will complete LB bathing with min A. OT Short Term Goal 3 (Week 1): Pt will complete sit<>stand in prep for standing ADLs with close supervision.  Skilled Therapeutic Interventions/Progress Updates:    1;1 Rearranged schedule to see patient for partial session d/t needing to go to dialysis at 1pm. Pt missed 30 min overall d/t schedule constraints. Pt received in w/c agreeable to OT requesting to complete oral care and face washing at sink with set up. Pt on RA throughotu session with O2 >92% with all tasks. Attempted for standing endurance task during pipe tree activity, however pt only able to stand 35 seconds. Pt finishes pipe tree in sitting with increased difficulty noticing/responding to errors in lengths of pipes making pieces not fit together withotu direct VC. Stand pivot transfer back to bed with VC for RW maangment and reach back for safety. Exited session with pt seated in bed, exit alarm on and call light in reach   Therapy Documentation Precautions:  Precautions Precautions: Fall,Other (comment) Precaution Comments: 3L O2 with mobility, sacral wound, trach Restrictions Weight Bearing Restrictions: No General:   Vital Signs: Therapy Vitals Temp: 98.5 F (36.9 C) Temp Source: Oral Pulse Rate: 92 Resp: 18 BP: 101/67 Patient Position (if appropriate): Lying Oxygen Therapy SpO2: 96 % O2 Device: Room Air Pain:   ADL: ADL Eating: Set up Where Assessed-Eating: Chair Grooming: Supervision/safety Where  Assessed-Grooming: Edge of bed Upper Body Bathing: Minimal assistance Where Assessed-Upper Body Bathing: Edge of bed Lower Body Bathing: Moderate assistance Where Assessed-Lower Body Bathing: Edge of bed Upper Body Dressing: Supervision/safety Where Assessed-Upper Body Dressing: Edge of bed Lower Body Dressing: Moderate assistance Where Assessed-Lower Body Dressing: Edge of bed Toileting: Maximal assistance Where Assessed-Toileting: Bedside Commode Toilet Transfer: Moderate assistance Toilet Transfer Method: Stand pivot Science writer: Extra wide Radiographer, therapeutic: Not assessed Social research officer, government: Not assessed Vision   Perception    Praxis   Exercises:   Other Treatments:     Therapy/Group: Individual Therapy  Tonny Branch 05/19/2020, 6:39 AM

## 2020-05-19 NOTE — Procedures (Signed)
   I was present at this dialysis session, have reviewed the session itself and made  appropriate changes Kelly Splinter MD [place] pager [phone]   05/19/2020, 6:50 PM

## 2020-05-19 NOTE — Progress Notes (Signed)
[place] [facility] PHYSICAL MEDICINE & REHABILITATION PROGRESS NOTE   Subjective/Complaints:  Pt reports L buttock pain- from ulcer? Gets her meds after HD today.   Is due to HD today-  Breathing well-  WBC down to 10. 8 from 14k And Hb improved to 8.0  ROS- Pt denies SOB, abd pain, CP, N/V/C/D, and vision changes   Objective:   DG Chest 2 View  Result Date: 05/17/2020 CLINICAL DATA:  Leukocytosis EXAM: CHEST - 2 VIEW COMPARISON:  04/03/2020 FINDINGS: Previously noted tracheostomy and nasoenteric feeding tube have been removed. Left internal jugular hemodialysis catheter tip is unchanged within the right atrium. Mild right-sided volume loss is noted. Previously noted extensive asymmetric right-sided pulmonary infiltrate has significantly improved with minimal residual infiltrate noted within the right lung. Mild right basilar parenchymal scarring is noted. Left lung is clear. No pneumothorax or pleural effusion. Cardiac size is within normal limits. No acute bone abnormality. IMPRESSION: Significantly improved right lung pulmonary infiltrate with minimal residual infiltrate noted. Superimposed parenchymal scarring and mild right-sided volume loss now present. Electronically Signed   By: Fidela Salisbury MD   On: 05/17/2020 20:36   Recent Labs    05/17/20 1528 05/19/20 0447  WBC 14.0* 10.8*  HGB 7.3* 8.0*  HCT 23.1* 24.9*  PLT 380 346   Recent Labs    05/17/20 1528  NA 136  K 5.2*  CL 93*  CO2 25  GLUCOSE 165*  BUN 81*  CREATININE 7.54*  CALCIUM 10.8*    Intake/Output Summary (Last 24 hours) at 05/19/2020 1345 Last data filed at 05/19/2020 1249 Gross per 24 hour  Intake 640 ml  Output --  Net 640 ml     Pressure Injury 03/06/20 Sacrum Mid Stage 4 - Full thickness tissue loss with exposed bone, tendon or muscle. full thickness tissue loss. base of wound is red with big skin flap coverings as a lid. wound is tunneling at 3 o'clock, 7 o'clock, and 9 o'cloc (Active)  03/06/20  0000  Location: Sacrum  Location Orientation: Mid  Staging: Stage 4 - Full thickness tissue loss with exposed bone, tendon or muscle.  Wound Description (Comments): full thickness tissue loss. base of wound is red with big skin flap coverings as a lid. wound is tunneling at 3 o'clock, 7 o'clock, and 9 o'clock  Present on Admission: No    Physical Exam: Vital Signs Blood pressure 101/67, pulse 92, temperature 98.5 F (36.9 C), temperature source Oral, resp. rate 18, height 5\' 2"  (1.575 m), weight 93.4 kg, SpO2 96 %. HEENT: healed trach    Gen: awake, alert, appropriate, sitting up in bed, NAD HEENT: oral mucosa pink and moist, NCAT Cardio: Regular rate Chest: CTA B/L- no W/R/R- good air movement On 2L O2 by [place] Abd: Soft, NT, ND, (+)BS  Ext: no edema  Skin: Sacral decubitus    Neurologic: Cranial nerves II through XII intact, motor strength is 4/5 in bilateral deltoid, bicep, tricep, grip, hip flexor, knee extensors, ankle dorsiflexor and plantar flexor Sensory exam able to sense LT but reports numbness RIght little finger and Left toes Cerebellar exam normal finger to nose to finger as well as heel to shin in bilateral upper and lower extremities Musculoskeletal: Full range of motion in all 4 extremities. No joint swelling    Assessment/Plan: 1. Functional deficits which require 3+ hours per day of interdisciplinary therapy in a comprehensive inpatient rehab setting.  Physiatrist is providing close team supervision and 24 hour management of active medical problems listed  below.  Physiatrist and rehab team continue to assess barriers to discharge/monitor patient progress toward functional and medical goals  Care Tool:  Bathing    Body parts bathed by patient: Face,Left lower leg,Right lower leg,Left upper leg,Left arm,Right arm,Right upper leg,Chest,Front perineal area,Abdomen   Body parts bathed by helper: Buttocks     Bathing assist Assist Level: Moderate Assistance  - Patient 50 - 74%     Upper Body Dressing/Undressing Upper body dressing   What is the patient wearing?: Hospital gown only    Upper body assist Assist Level: Contact Guard/Touching assist    Lower Body Dressing/Undressing Lower body dressing      What is the patient wearing?: Incontinence brief     Lower body assist Assist for lower body dressing: Moderate Assistance - Patient 50 - 74%     Toileting Toileting    Toileting assist Assist for toileting: Maximal Assistance - Patient 25 - 49%     Transfers Chair/bed transfer  Transfers assist     Chair/bed transfer assist level: Minimal Assistance - Patient > 75%     Locomotion Ambulation   Ambulation assist      Assist level: Minimal Assistance - Patient > 75% Assistive device: Walker-rolling Max distance: 71ft   Walk 10 feet activity   Assist     Assist level: Minimal Assistance - Patient > 75% Assistive device: Walker-rolling   Walk 50 feet activity   Assist Walk 50 feet with 2 turns activity did not occur: Safety/medical concerns         Walk 150 feet activity   Assist Walk 150 feet activity did not occur: Safety/medical concerns         Walk 10 feet on uneven surface  activity   Assist Walk 10 feet on uneven surfaces activity did not occur: Safety/medical concerns         Wheelchair     Assist Will patient use wheelchair at discharge?: Yes Type of Wheelchair: Manual    Wheelchair assist level: Supervision/Verbal cueing Max wheelchair distance: 120ft    Wheelchair 50 feet with 2 turns activity    Assist        Assist Level: Supervision/Verbal cueing   Wheelchair 150 feet activity     Assist      Assist Level: Moderate Assistance - Patient 50 - 74%   Blood pressure 101/67, pulse 92, temperature 98.5 F (36.9 C), temperature source Oral, resp. rate 18, height 5\' 2"  (1.575 m), weight 93.4 kg, SpO2 96 %.    Medical Problem List and Plan: 1.   Functional and mobility deficits secondary to debility after COVID and associated complications including VDRF/trach and large sacral wound Has neuropathy, mononeuropathy multiplex pattern, no sig neurogenic pain             -patient may not yet shower             -ELOS/Goals: 14-21 days, mod I PT, min assist OT, mod I SLP  Continue CIR 2.  Antithrombotics: -DVT/anticoagulation:  Pharmaceutical: Heparin             -antiplatelet therapy: N/A 3. Pain Management: Continue Oxycodone prn.  12/18- pt reports gets her pain meds for sacral decub after HD- con't regimen  4. Mood: LCSW to follow for evaluation and support.              -antipsychotic agents: N/A 5. Neuropsych: This patient is capable of making decisions on her own behalf--dicussed code status -->SHE WANTS TO BE  FULL CODE . 6.Large stage IV sacral/buttock wound/Wound Care: Meropenum completed 05/13/20.     -Juven tid, glucerna tid and vitamin B w/C.               -Santyl to eschar on inferior flap and wet to dry dressing changes bid and prn to keep wound clean of stool to prevent contamination.              -Pressure relief measure with air mattress.                -WBC trending upwards--recheck in am   12/18- WBC down to 10.8 from 14k- will monitor 7. Fluids/Electrolytes/Nutrition: Renal diet with 1200 cc FR. Strict I/O with daily weights.              -check labs with HD 8.T2DM: On glucerna tid with meals. Monitor BS ac/hs--On Lantus 3 units daily with SSI for elevated BS.  CBG (last 3)  Recent Labs    05/18/20 2133 05/19/20 0613 05/19/20 1116  GLUCAP 178* 138* 171*  12/18- BGs 138-170s- con't regimen  9. Hypotension: Monitor BP tid--especially with increase in activity. Continue Midodrine 10 mg tid.  Vitals:   05/18/20 2026 05/19/20 0524  BP: 112/61 101/67  Pulse: 100 92  Resp:  18  Temp: 98.8 F (37.1 C) 98.5 F (36.9 C)  SpO2: 96% 96%  BP soft, will continue to monitor on Midodrine, nephro adjusting HD   Hypthyroid: Stable on supplement.  10. ESRD:  HD -TTS with nephrology to assist with management. Schedule HD at the end of the day to help with tolerance of activity. Sevelmar tid for metabolic bone disease. Hyperkalemia managed with HD.  12/18- K+ 5.2- con't HD and they handle K+ issues.  11. Anemia of critical illness/anemia of CKD: On Aranesp 150 mc/week. Hgb stable at 7.2 range past week.  Transfuse prn Hgb<7.0.    12/18- Hb up to 8.0- con't regimen 12.  Anxiety: Continue low dose klonopin in am and Seroquel at nights to help with sleep.  13. Leukocytosis.: Given SOB, ordered CXR 12/16 which was stable. WBC increased further to 14 on 12/17. UA/UC ordered and repeat CBC ordered for 12/18  12/18- WBC down to 10.8- con't to monitor for clinical Sx's of infection   LOS: 4 days A FACE TO FACE EVALUATION WAS PERFORMED  Vanassa Penniman 05/19/2020, 1:45 PM

## 2020-05-20 LAB — URINE CULTURE: Culture: NO GROWTH

## 2020-05-20 LAB — GLUCOSE, CAPILLARY
Glucose-Capillary: 134 mg/dL — ABNORMAL HIGH (ref 70–99)
Glucose-Capillary: 207 mg/dL — ABNORMAL HIGH (ref 70–99)
Glucose-Capillary: 181 mg/dL — ABNORMAL HIGH (ref 70–99)
Glucose-Capillary: 182 mg/dL — ABNORMAL HIGH (ref 70–99)

## 2020-05-20 MED ORDER — CHLORHEXIDINE GLUCONATE 0.12 % MT SOLN
OROMUCOSAL | Status: AC
  Administered 2020-05-20: 21:00:00 15 mL via OROMUCOSAL
  Filled 2020-05-20: qty 15

## 2020-05-20 NOTE — Progress Notes (Signed)
[facility] NEPHROLOGY PROGRESS NOTE  Assessment/ Plan: Pt is a 48 y.o. yo female  w/ ESRD on TTS HD admitted for COVID PNA on 02/13/20. She was intubated. SP CRRT around 9/26- 10/7. Now is on intermittent HD MWF here. She is sp trach. She had MSSA pna. DM2.Then she had large sacral decub requiring I&D x 2.    Physical Exam: General: trach out now Heart:RRR, s1s2 nl Lungs: Bibasal rhonchi and some coarse breath sound Abdomen:soft, Non-tender, non-distended Extremities: No edema Dialysis Access: Left IJ tunnel HD catheter   OP HD:TTS  4h 56mn 450/800 2/2.25 bath 111.5kg Hep 2000 TDC  - L AVF clotted while here, using TDC now  - darbe 50 ug q week, last 9/7 - calc 1.0 tiw - 9/9 Hb 10.0, tsat 22%  Problems: 1. COVID pna / sp trach / MSSA PNA - off vent on trach collar. Trach removed.  2. Hypotension - BP's stable on midodrine 10 tid. Monitor BP. 3. Stage IV sacral decub - sp I&D x 2.  SP meropenem course. Wound care, general surgery is following, wound is improving.  4. ESRD - S/P CRRT, now HD TTS. Next HD Tuesday.  5. Anemia ckd/ critical illness - on darbe 150 weekly, 11/12 Ferritin >4000, iron sat 11. With active infection and ^^ ferritin no IV iron. tx prbc's PRN. Rechecked iron panel-- Ferritin 2777 and %sat 18, hold off on IV iron for now. 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 Monitor 7. HD access: LUA AVF clotted here during acute covid. TDC x2 per IR 8. Dispo: now on CIR  RKelly Splinter MD 05/20/2020, 6:57 PM      Subjective: Seen and examined.  No c/o.    Objective Vital signs in last 24 hours: Vitals:   05/19/20 2057 05/20/20 0500 05/20/20 0502 05/20/20 1345  BP: 108/76  (!) 97/59 105/61  Pulse: (!) 109  100 (!) 105  Resp: '18  18 18  ' Temp: 98.2 F (36.8 C)  98.9 F (37.2 C) 97.8 F (36.6 C)  TempSrc: Oral  Oral Oral  SpO2: 100%  94% 100%  Weight:  104.3 kg    Height:       Weight change:    Intake/Output Summary (Last 24 hours) at 05/20/2020 1857 Last data filed at 05/20/2020 1753 Gross per 24 hour  Intake 700 ml  Output 1771 ml  Net -1071 ml       Labs: Basic Metabolic Panel: Recent Labs  Lab 05/15/20 0329 05/17/20 1528 05/19/20 1547  NA 137 136 132*  K 5.9* 5.2* 5.4*  CL 100 93* 91*  CO2 '22 25 24  ' GLUCOSE 115* 165* 214*  BUN 90* 81* 63*  CREATININE 7.41* 7.54* 7.17*  CALCIUM 10.4* 10.8* 10.3  PHOS 6.0* 6.8* 5.4*   Liver Function Tests: Recent Labs  Lab 05/15/20 0329 05/17/20 1528 05/19/20 1547  ALBUMIN 2.2* 2.5* 2.6*   No results for input(s): LIPASE, AMYLASE in the last 168 hours. No results for input(s): AMMONIA in the last 168 hours. CBC: Recent Labs  Lab 05/15/20 1217 05/17/20 1528 05/19/20 0447  WBC 13.6* 14.0* 10.8*  NEUTROABS  --   --  5.5  HGB 7.2* 7.3* 8.0*  HCT 23.3* 23.1* 24.9*  MCV 89.3 88.5 86.5  PLT 421* 380 346   Cardiac Enzymes: No results for input(s): CKTOTAL, CKMB, CKMBINDEX, TROPONINI in the last 168 hours. CBG: Recent Labs  Lab 05/19/20 1116 05/19/20 2059 05/20/20 0610 05/20/20 1147  05/20/20 1621  GLUCAP 171* 190* 134* 207* 181*    Iron Studies: No results for input(s): IRON, TIBC, TRANSFERRIN, FERRITIN in the last 72 hours. Studies/Results: No results found.  Medications: Infusions:   Scheduled Medications: . B-complex with vitamin C  1 tablet Oral Daily  . brimonidine  1 drop Both Eyes TID  . brinzolamide  1 drop Both Eyes TID  . chlorhexidine gluconate (MEDLINE KIT)  15 mL Mouth Rinse BID  . Chlorhexidine Gluconate Cloth  6 each Topical Daily  . clonazepam  0.25 mg Oral Daily  . collagenase   Topical Daily  . darbepoetin (ARANESP) injection - DIALYSIS  150 mcg Intravenous Q Thu-HD  . feeding supplement (GLUCERNA SHAKE)  237 mL Oral TID WC  . heparin  5,000 Units Subcutaneous Q8H  . hydrocerin   Topical Daily  . insulin aspart  0-6 Units Subcutaneous TID AC & HS  . insulin glargine  4 Units  Subcutaneous QHS  . latanoprost  1 drop Both Eyes q AM  . levothyroxine  125 mcg Oral Q0600  . midodrine  10 mg Oral TID WC  . multivitamin  1 tablet Oral QHS  . nutrition supplement (JUVEN)  1 packet Oral BID BM  . pantoprazole  40 mg Oral Daily  . QUEtiapine  50 mg Oral QHS  . sevelamer carbonate  1,600 mg Oral TID WC    have reviewed scheduled and prn medications.

## 2020-05-21 ENCOUNTER — Inpatient Hospital Stay (HOSPITAL_COMMUNITY): Payer: Medicaid Other | Admitting: Physical Therapy

## 2020-05-21 ENCOUNTER — Encounter (HOSPITAL_COMMUNITY): Payer: Self-pay | Admitting: Physical Medicine & Rehabilitation

## 2020-05-21 ENCOUNTER — Inpatient Hospital Stay (HOSPITAL_COMMUNITY): Payer: Medicaid Other | Admitting: Occupational Therapy

## 2020-05-21 LAB — GLUCOSE, CAPILLARY
Glucose-Capillary: 151 mg/dL — ABNORMAL HIGH (ref 70–99)
Glucose-Capillary: 148 mg/dL — ABNORMAL HIGH (ref 70–99)
Glucose-Capillary: 207 mg/dL — ABNORMAL HIGH (ref 70–99)
Glucose-Capillary: 140 mg/dL — ABNORMAL HIGH (ref 70–99)

## 2020-05-21 LAB — HEPATITIS B SURFACE ANTIBODY, QUANTITATIVE: Hep B S AB Quant (Post): 110.3 m[IU]/mL (ref >9.9)

## 2020-05-21 NOTE — Progress Notes (Signed)
[facility] PHYSICAL MEDICINE & REHABILITATION PROGRESS NOTE   Subjective/Complaints: Has some complaints of pain at sacral site.  She continues to have shortness of breath with activity- stable.  Denies other areas of pain Moving bowels regularly.   ROS- Pt denies SOB, abd pain, CP, N/V/C/D, and vision changes, + pain at pressure injury site.    Objective:   No results found. Recent Labs    05/19/20 0447  WBC 10.8*  HGB 8.0*  HCT 24.9*  PLT 346   Recent Labs    05/19/20 1547  NA 132*  K 5.4*  CL 91*  CO2 24  GLUCOSE 214*  BUN 63*  CREATININE 7.17*  CALCIUM 10.3    Intake/Output Summary (Last 24 hours) at 05/21/2020 1107 Last data filed at 05/21/2020 0734 Gross per 24 hour  Intake 915 ml  Output --  Net 915 ml        Physical Exam: Vital Signs Blood pressure 115/82, pulse 99, temperature 98.9 F (37.2 C), temperature source Oral, resp. rate 16, height 5\' 2"  (1.575 m), weight 104.3 kg, SpO2 91 %. HEENT: healed trach   Gen: no distress, normal appearing HEENT: oral mucosa pink and moist, NCAT Cardio: Reg rate Chest: normal effort, normal rate of breathing Abd: soft, non-distended Ext: no edema  Skin: Sacral decubitus    Neurologic: Cranial nerves II through XII intact, motor strength is 4/5 in bilateral deltoid, bicep, tricep, grip, hip flexor, knee extensors, ankle dorsiflexor and plantar flexor Sensory exam able to sense LT but reports numbness RIght little finger and Left toes Cerebellar exam normal finger to nose to finger as well as heel to shin in bilateral upper and lower extremities Musculoskeletal: Full range of motion in all 4 extremities. No joint swelling    Assessment/Plan: 1. Functional deficits which require 3+ hours per day of interdisciplinary therapy in a comprehensive inpatient rehab setting.  Physiatrist is providing close team supervision and 24 hour management of active medical problems listed below.  Physiatrist and  rehab team continue to assess barriers to discharge/monitor patient progress toward functional and medical goals  Care Tool:  Bathing    Body parts bathed by patient: Face,Right lower leg,Left upper leg,Left arm,Right arm,Right upper leg,Chest,Front perineal area,Abdomen   Body parts bathed by helper: Buttocks,Left lower leg     Bathing assist Assist Level: Minimal Assistance - Patient > 75%     Upper Body Dressing/Undressing Upper body dressing   What is the patient wearing?: Hospital gown only    Upper body assist Assist Level: Set up assist    Lower Body Dressing/Undressing Lower body dressing      What is the patient wearing?: Incontinence brief     Lower body assist Assist for lower body dressing: Moderate Assistance - Patient 50 - 74%     Toileting Toileting    Toileting assist Assist for toileting: Maximal Assistance - Patient 25 - 49%     Transfers Chair/bed transfer  Transfers assist     Chair/bed transfer assist level: Minimal Assistance - Patient > 75%     Locomotion Ambulation   Ambulation assist      Assist level: Minimal Assistance - Patient > 75% Assistive device: Walker-rolling Max distance: 63ft   Walk 10 feet activity   Assist     Assist level: Minimal Assistance - Patient > 75% Assistive device: Walker-rolling   Walk 50 feet activity   Assist Walk 50 feet with 2 turns activity did not occur: Safety/medical concerns  Walk 150 feet activity   Assist Walk 150 feet activity did not occur: Safety/medical concerns         Walk 10 feet on uneven surface  activity   Assist Walk 10 feet on uneven surfaces activity did not occur: Safety/medical concerns         Wheelchair     Assist Will patient use wheelchair at discharge?: Yes Type of Wheelchair: Manual    Wheelchair assist level: Supervision/Verbal cueing Max wheelchair distance: 139ft    Wheelchair 50 feet with 2 turns  activity    Assist        Assist Level: Supervision/Verbal cueing   Wheelchair 150 feet activity     Assist      Assist Level: Moderate Assistance - Patient 50 - 74%   Blood pressure 115/82, pulse 99, temperature 98.9 F (37.2 C), temperature source Oral, resp. rate 16, height 5\' 2"  (1.575 m), weight 104.3 kg, SpO2 91 %.    Medical Problem List and Plan: 1.  Functional and mobility deficits secondary to debility after COVID and associated complications including VDRF/trach and large sacral wound Has neuropathy, mononeuropathy multiplex pattern, no sig neurogenic pain             -patient may not yet shower             -ELOS/Goals: 14-21 days, mod I PT, min assist OT, mod I SLP  Continue CIR 2.  Antithrombotics: -DVT/anticoagulation:  Pharmaceutical: Heparin             -antiplatelet therapy: N/A 3. Pain Management: Continue Oxycodone prn.  12/18- pt reports gets her pain meds for sacral decub after HD- con't regimen  4. Mood: LCSW to follow for evaluation and support.              -antipsychotic agents: N/A 5. Neuropsych: This patient is capable of making decisions on her own behalf--dicussed code status -->SHE WANTS TO BE FULL CODE . 6.Large stage IV sacral/buttock wound/Wound Care: Meropenum completed 05/13/20.     -Juven tid, glucerna tid and vitamin B w/C.               -Santyl to eschar on inferior flap and wet to dry dressing changes bid and prn to keep wound clean of stool to prevent contamination.              -Pressure relief measure with air mattress.                -WBC trending upwards--recheck in am   12/18- WBC down to 10.8 from 14k- will monitor 7. Fluids/Electrolytes/Nutrition: Renal diet with 1200 cc FR. Strict I/O with daily weights.              -check labs with HD 8.T2DM: On glucerna tid with meals. Monitor BS ac/hs--On Lantus 3 units daily with SSI for elevated BS.  CBG (last 3)  Recent Labs    05/20/20 1621 05/20/20 2112 05/21/20 0606   GLUCAP 181* 182* 151*  12/20: CBGs 150s-180s: continue current regimen  9. Hypotension: Monitor BP tid--especially with increase in activity. Continue Midodrine 10 mg tid.  Vitals:   05/20/20 2023 05/21/20 0505  BP: 110/69 115/82  Pulse: 92 99  Resp: 18 16  Temp: 99.1 F (37.3 C) 98.9 F (37.2 C)  SpO2: 100% 91%    12/20: BP is normotensive, continue to monitor on Midodrine, nephro adjusting HD  Hypthyroid: Stable on supplement.  10. ESRD:  HD -TTS with nephrology to  assist with management. Schedule HD at the end of the day to help with tolerance of activity. Sevelmar tid for metabolic bone disease. Hyperkalemia managed with HD.  12/18- K+ 5.2- con't HD and they handle K+ issues.   12/20: nephro note reviewed, appreciate nephrology following 11. Anemia of critical illness/anemia of CKD: On Aranesp 150 mc/week. Hgb stable at 7.2 range past week.  Transfuse prn Hgb<7.0.    12/18- Hb up to 8.0- con't regimen 12.  Anxiety: Continue low dose klonopin in am and Seroquel at nights to help with sleep.  13. Leukocytosis.: Given SOB, ordered CXR 12/16 which was stable. WBC increased further to 14 on 12/17. UA/UC ordered and repeat CBC ordered for 12/18  12/18- WBC down to 10.8- con't to monitor for clinical Sx's of infection   LOS: 6 days A FACE TO FACE EVALUATION WAS PERFORMED  Wanda Ramirez P Santiaga Butzin 05/21/2020, 11:07 AM

## 2020-05-21 NOTE — Progress Notes (Signed)
Physical Therapy Session Note  Patient Details  Name: Wanda Ramirez MRN: [id number] Date of Birth: Jan 14, 1971  Today's Date: 05/21/2020 PT Individual Time: 0805-0900 PT Individual Time Calculation (min): 55 min   Short Term Goals: Week 1:  PT Short Term Goal 1 (Week 1): pt will perform all transfers with CGA PT Short Term Goal 2 (Week 1): pt will perform bed mobility with CGA PT Short Term Goal 3 (Week 1): pt will ambulate ~69f with CGA and LRAD PT Short Term Goal 4 (Week 1): pt will initiate stair training  Skilled Therapeutic Interventions/Progress Updates: [facility]. [facility] and PTA performed peri-care total A. Pt then performed stand pivot to bed CGA and donned pants with minA for time management and performed STS CGA to pull pants over hips. Pt then ambulated x 568fto w/c and transported to sink to perform oral hygiene mod I. Pt then transported to rehab gym and participated in ambulation ~2048f3 CGA with seated rests between. Pt noted 8/10 on mBORG with mild dyspnea after ambulation but recovered with rest. After ambulation pt participated in following therex for general conditioning: LAQ, AROM 2 x 10, hip flexion with 2.5lb wts x 10 bilaterally, hip abd/add with 2.5lb wts x 10 bilaterally, hamstring pulls with level 1 resistance band 2 x 10 bilaterally. Pt then performed stand pivot to w/c with RW and supervision. Pt transported back to room and performed stand pivot to bed and sit to supine with use of bed features and close S overall. Pt used bed rails to bridge and boost [facility] left in bed at end of session with bed alarm on, call bell within reach and needs met.      Therapy Documentation Precautions:  Precautions Precautions: Fall,Other (comment) Precaution Comments: 3L O2 with mobility, sacral wound, trach Restrictions Weight Bearing Restrictions: No General:   Vital Signs: Therapy  Vitals Temp: 98.9 F (37.2 C) Temp Source: Oral Pulse Rate: (!) 102 Resp: 19 BP: 103/68 Patient Position (if appropriate): Sitting Oxygen Therapy SpO2: 98 % O2 Device: Room Air    Therapy/Group: Individual Therapy  Faithanne Verret  Keena Heesch, PTA  05/21/2020, 4:07 PM

## 2020-05-21 NOTE — Progress Notes (Signed)
Occupational Therapy Session Note  Patient Details  Name: Wanda Ramirez MRN: [id number] Date of Birth: [birth year]  Today's Date: 05/21/2020 OT Individual Time: 1001-1044 and 1310-1424 OT Individual Time Calculation (min): 43 min and 74 min  Short Term Goals: Week 1:  OT Short Term Goal 1 (Week 1): Pt will don BLE into pants with min A. OT Short Term Goal 2 (Week 1): Pt will complete LB bathing with min A. OT Short Term Goal 3 (Week 1): Pt will complete sit<>stand in prep for standing ADLs with close supervision.  Skilled Therapeutic Interventions/Progress Updates:    Pt greeted in bed with c/o wound pain but premedicated. She declined toileting, stating she already used the BSC morning. Pt was agreeable to engage in bathing/dressing tasks sit<stand from EOB using RW for standing support. Pt satting at 97% on RA, trach site covered with gauze and tape. Supine<sit completed unassisted and pt required setup for UB self care, assistance for functionally reaching the Lt foot and buttocks (due to safely with wound dressings). CGA for sit<stands and for dynamic standing balance. 02 sats throughout session remained between 96-100%, even post stands. At end of session pt returned to bed and transitioned to sidelying position for wound pressure relief. Left pt in bed with all needs within reach. Tx focus placed on ADL retraining, sitting/standing balance, and sit<stands.   2nd Session 1:1 tx (74 min) Pt greeted in bed, ADL needs met, agreeable to session, satting at 99% on RA. She completed bed mobility unassisted using the bedrail and then dressed at sit<stand level from EOB using the RW for standing support. She needed assistance for full elevation in the back due to bulky wound dressings. CGA for stand pivot<w/c. She was then transported to the community setting of the hospital to address psychosocial health. Worked on [employer] and endurance by self propelling the w/c in gift shop, [place], and  food court. Pt required vcs for navigating through tight spaces, symmetrical UE movement, and larger more efficient pushes. Pt then ambulated 15 ft and 20 ft using the RW in a straight path with close w/c follow, close supervision-CGA for balance. Worked on pt self monitoring her fatigue and initiating sitting down to rest during onset of fatigue for safety. Sock aide training completed for increasing her functional independence with donning footwear. Pt at first requiring max cuing fading to min cuing with repetitive practice. She was then returned to the room via w/c. Practiced bed mobility from flat bed without bedrails per setup at home. Pt able to complete transfer with supervision assist using RW to ambulate a short distance to bed given CGA. She reports she plans to d/c home with mother now. Mother reportedly has a w/c accessible home, can provide 24/7 supervision and is in good health/driving. Discussed with pt DME needs and f/u and relayed this to SW via virtual communication. Pt remained sitting in the w/c at close of session, left with all needs within reach and safety belt fastened, in care of RN to receive her pain medicine.      Therapy Documentation Precautions:  Precautions Precautions: Fall,Other (comment) Precaution Comments: 3L O2 with mobility, sacral wound, trach Restrictions Weight Bearing Restrictions: No ADL: ADL Eating: Set up Where Assessed-Eating: Chair Grooming: Supervision/safety Where Assessed-Grooming: Edge of bed Upper Body Bathing: Minimal assistance Where Assessed-Upper Body Bathing: Edge of bed Lower Body Bathing: Moderate assistance Where Assessed-Lower Body Bathing: Edge of bed Upper Body Dressing: Supervision/safety Where Assessed-Upper Body Dressing: Edge of bed Lower  Body Dressing: Moderate assistance Where Assessed-Lower Body Dressing: Edge of bed Toileting: Maximal assistance Where Assessed-Toileting: Bedside Commode Toilet Transfer: Moderate  assistance Toilet Transfer Method: Stand pivot Toilet Transfer Equipment: Extra wide Engineer, technical sales Transfer: Not assessed Social research officer, government: Not assessed      Therapy/Group: Individual Therapy  Admir Candelas A Warren Lindahl 05/21/2020, 12:40 PM

## 2020-05-22 ENCOUNTER — Inpatient Hospital Stay (HOSPITAL_COMMUNITY): Payer: Medicaid Other | Admitting: Physical Therapy

## 2020-05-22 ENCOUNTER — Inpatient Hospital Stay (HOSPITAL_COMMUNITY): Payer: Medicaid Other

## 2020-05-22 LAB — CBC
HCT: 22.8 % — ABNORMAL LOW (ref 36.0–46.0)
Hemoglobin: 7.3 g/dL — ABNORMAL LOW (ref 12.0–15.0)
MCH: 27.5 pg (ref 26.0–34.0)
MCHC: 32 g/dL (ref 30.0–36.0)
MCV: 86 fL (ref 80.0–100.0)
Platelets: 359 10*3/uL (ref 150–400)
RBC: 2.65 MIL/uL — ABNORMAL LOW (ref 3.87–5.11)
RDW: 15 % (ref 11.5–15.5)
WBC: 10.1 10*3/uL (ref 4.0–10.5)
nRBC: 0 % (ref 0.0–0.2)

## 2020-05-22 LAB — GLUCOSE, CAPILLARY
Glucose-Capillary: 173 mg/dL — ABNORMAL HIGH (ref 70–99)
Glucose-Capillary: 155 mg/dL — ABNORMAL HIGH (ref 70–99)
Glucose-Capillary: 219 mg/dL — ABNORMAL HIGH (ref 70–99)

## 2020-05-22 LAB — RENAL FUNCTION PANEL
Albumin: 2.5 g/dL — ABNORMAL LOW (ref 3.5–5.0)
Anion gap: 16 — ABNORMAL HIGH (ref 5–15)
BUN: 60 mg/dL — ABNORMAL HIGH (ref 6–20)
CO2: 23 mmol/L (ref 22–32)
Calcium: 9.9 mg/dL (ref 8.9–10.3)
Chloride: 93 mmol/L — ABNORMAL LOW (ref 98–111)
Creatinine, Ser: 7.4 mg/dL — ABNORMAL HIGH (ref 0.44–1.00)
GFR, Estimated: 6 mL/min — ABNORMAL LOW (ref >60)
Glucose, Bld: 166 mg/dL — ABNORMAL HIGH (ref 70–99)
Phosphorus: 3.5 mg/dL (ref 2.5–4.6)
Potassium: 4.5 mmol/L (ref 3.5–5.1)
Sodium: 132 mmol/L — ABNORMAL LOW (ref 135–145)

## 2020-05-22 MED ORDER — LIDOCAINE HCL (PF) 1 % IJ SOLN
5.0000 mL | INTRAMUSCULAR | Status: DC | PRN
  Filled 2020-05-22: qty 5

## 2020-05-22 MED ORDER — HEPARIN SODIUM (PORCINE) 1000 UNIT/ML DIALYSIS: 1000.0000 [IU] | INTRAMUSCULAR | Status: DC | PRN

## 2020-05-22 MED ORDER — SODIUM CHLORIDE 0.9 % IV SOLN: 100.0000 mL | INTRAVENOUS | Status: DC | PRN

## 2020-05-22 MED ORDER — ALTEPLASE 2 MG IJ SOLR: 2.0000 mg | Freq: Once | INTRAMUSCULAR | Status: DC | PRN

## 2020-05-22 MED ORDER — PENTAFLUOROPROP-TETRAFLUOROETH EX AERO: 1.0000 "application " | INHALATION_SPRAY | CUTANEOUS | Status: DC | PRN

## 2020-05-22 MED ORDER — LIDOCAINE-PRILOCAINE 2.5-2.5 % EX CREA
1.0000 "application " | TOPICAL_CREAM | CUTANEOUS | Status: DC | PRN
  Filled 2020-05-22: qty 5

## 2020-05-22 MED ORDER — HEPARIN SODIUM (PORCINE) 1000 UNIT/ML IJ SOLN
INTRAMUSCULAR | Status: AC
  Filled 2020-05-22: qty 4

## 2020-05-22 MED ORDER — CHLORHEXIDINE GLUCONATE CLOTH 2 % EX PADS
6.0000 | MEDICATED_PAD | Freq: Every day | CUTANEOUS | Status: DC
  Administered 2020-05-23 – 2020-05-30 (×6): 6 via TOPICAL

## 2020-05-22 NOTE — Plan of Care (Signed)

## 2020-05-22 NOTE — Progress Notes (Signed)
Physical Therapy Session Note  Patient Details  Name: Wanda Ramirez MRN: [id number] Date of Birth: 05/30/1971  Today's Date: 05/22/2020 PT Individual Time: 0902-1000 and 1048-1200 PT Individual Time Calculation (min): 58 min an 72 min  Short Term Goals: Week 1:  PT Short Term Goal 1 (Week 1): pt will perform all transfers with CGA PT Short Term Goal 2 (Week 1): pt will perform bed mobility with CGA PT Short Term Goal 3 (Week 1): pt will ambulate ~78f with CGA and LRAD PT Short Term Goal 4 (Week 1): pt will initiate stair training Skilled Therapeutic Interventions/Progress Updates: Tx1: Pt presented in w/c agreeable to therapy. Pt states just received pain meds. Pt transported to rehab gym to attempt stairs 3in steps. Pt stood with supervision and stated feeling "woozy", pt with noted glazed appearance. Pt returned to sitting with vitals checked WNL, pt thinks recent pain meds may be contributing. Transported to day room and performed stand pivot with RW to NuStep with supervision. Performed NuStep L1 x 2 min then x 3 min with rest break, SpO2>98% with max HR 122. Pt c/o R knee pain initially however with cues to improve neutral alignment pt indicated subsided. Pt requesting to try stairs again as per pt "my head feels better". Pt transported to rehab gym and pt was able to ascend x 6 3in steps. At top of stairs pt states feels like she "wants to pass out" with significantly increased dyspnea. Chair obtained for pt and she was able to sit at top of stairs, HR 136 SpO2 96%. Within 3 min pt HR resolved to 110s and pt was able to descend x 6 steps with CGA and B rails. Pt propelled ~1277fwith alternating BUE towards room and PTA transported remaining distance back to room at end of session and performed ambulatory transfer to bed with RW and supervision. Performed sit to supine with supervision and use of bed feautres with increased effort. Pt left resting in sidelying with call bell within reach and  current needs met.   Tx2: Pt presented in bed agreeable to therapy. Pt with mild c/o pain at sacrum rest breaks provided as needed for pain management. Performed supine to sit with CGA and use of bed features. Pt noted to attempt "sit up" to come to supine with c/o increased pain, instructed in log roll technique with improved tolerance. Performed ambulatory transfer bed to w/c with supervision and RW. Pt transported to day room and participated in ambulation for endurance and general conditioning. Pt ambulated [id number]. PTA introduced rollator for last bout of 35ft 60finstructed in use of breaks. Pt was able to ambulate last 35ft w81fsupervision and verbal cues only for safety with break management.  Pt then transferred to mat and participated in ball taps 15 x 2 with 2lb dowel. Lastly participated in peg board in standing for endurance. Pt was able to complete ~2 rows before requring seated rest break, upon sitting HR consistently in mid 130s then resolved to high 110s within approx 2 min. Once completed pt returned to w/c and transported back to room. Performed ambulatory transfer to bed and doffed pants and top with supervision. Pt left seated EOB to eat lunch with bed alarm on, call bell within reach and NT present to check BS.      Therapy Documentation Precautions:  Precautions Precautions: Fall,Other (comment) Precaution Comments: 3L O2 with mobility, sacral wound, trach Restrictions Weight Bearing Restrictions: No General:   Vital  Signs:    Therapy/Group: Individual Therapy  Ruhi Kopke  Yerick Eggebrecht, PTA  05/22/2020, 12:38 PM

## 2020-05-22 NOTE — Progress Notes (Signed)
Occupational Therapy Session Note  Patient Details  Name: Wanda Ramirez MRN: [id number] Date of Birth: 05/20/1971  Today's Date: 05/22/2020 OT Individual Time: 0735-0830 OT Individual Time Calculation (min): 55 min    Short Term Goals: Week 1:  OT Short Term Goal 1 (Week 1): Pt will don BLE into pants with min A. OT Short Term Goal 2 (Week 1): Pt will complete LB bathing with min A. OT Short Term Goal 3 (Week 1): Pt will complete sit<>stand in prep for standing ADLs with close supervision.  Skilled Therapeutic Interventions/Progress Updates:    Pt received supine, easily awoken and in good spirits. Pt requesting to use bathroom. She completed bed mobiltiy with CGA. HR 125 EOB, SpO2 100% on RA. Pt completed sit > stand with CGA and pivoted to wide BSC. Clothing management with CGA. Pt voided BM. HR still 125-130 bpm. Pt voided BM and then required mod A for hygiene in standing 2/2 bulky sacral dressing. Pt required 2x seated rest break 2/2 fatigue. She returned to EOB and donned pants with min A overall. Pt donned shirt with min A to pull down posteriorly. Pt HR remaining high, with one jump as high as 140 bpm. Pt given extended rest break EOB and with cueing it came down to 118 bpm. Pt asymptomatic. SpO2 remaining 98% or greater on RA. Vaseline and new socks donned, reinforced proper foot hygiene importance. Pt completed ambulatory transfer using RW to the sink with CGA. Pt requesting to be on 2L O2 via [place] (her baseline). Pt completed grooming and oral care seated in the w/c. Pt was taken to the therapy gym via w/c. She completed functional stepping activity using bilateral railing 2x 4 repetitions with min A overall. Rest breaks provided between as well as vitals monitored. Pt returned to her room and was left sitting up in the w/c with all needs met.   Therapy Documentation Precautions:  Precautions Precautions: Fall,Other (comment) Precaution Comments: 3L O2 with mobility, sacral wound,  trach Restrictions Weight Bearing Restrictions: No  Therapy/Group: Individual Therapy   H  05/22/2020, 6:41 AM  

## 2020-05-22 NOTE — Progress Notes (Signed)
[facility] Progress Note   Assessment/ Plan:   OP HD:TTS  4h 52mn 450/800 2/2.25 bath 111.5kg Hep 2000 TDC  - L AVF clotted while here, using TDC now  - darbe 50 ug q week, last 9/7 - calc 1.0 tiw - 9/9 Hb 10.0, tsat 22%  Problems: 1. COVID pna / sp trach / MSSA PNA - off vent on trach collar. Trach removed.  2. Hypotension - BP's stable on midodrine 10 tid. Monitor BP. 3. Stage IV sacral decub - sp I&D x 2.  SP meropenem course.Wound care, general surgery is following, wound is improving.  4. ESRD - S/P CRRT, now HD TTS.Next HD Tuesday (today).  5. Anemia ckd/ critical illness - on darbe 150 weekly, 11/12 Ferritin >4000, iron sat 11. With active infection and ^^ ferritin no IV iron. tx prbc's PRN. Rechecked iron panel-- Ferritin 2777 and %sat 18, hold off on IV iron for now. 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 Monitor 7. HD access: LUA AVF clotted here during acute covid. TDC x2 per IR 8. Dispo: now on CIR   Subjective:    Seen on dialysis.  Has some sacral pain- reports that she may be going back on the woundvac per her report.     Objective:   BP (!) 147/74   Pulse 98   Temp 98.2 F (36.8 C) (Oral)   Resp 16   Ht '5\' 2"'  (1.575 m)   Wt 105 kg   SpO2 99%   BMI 42.34 kg/m   Physical Exam: Gen: NAD, lying in bed CVS: RRR Resp: clear Abd: soft, nontender Ext: no LE edema  Labs: BMET Recent Labs  Lab 05/17/20 1528 05/19/20 1547 05/22/20 1232  NA 136 132* 132*  K 5.2* 5.4* 4.5  CL 93* 91* 93*  CO2 '25 24 23  ' GLUCOSE 165* 214* 166*  BUN 81* 63* 60*  CREATININE 7.54* 7.17* 7.40*  CALCIUM 10.8* 10.3 9.9  PHOS 6.8* 5.4* 3.5   CBC Recent Labs  Lab 05/17/20 1528 05/19/20 0447 05/22/20 1232  WBC 14.0* 10.8* 10.1  NEUTROABS  --  5.5  --   HGB 7.3* 8.0* 7.3*  HCT 23.1* 24.9* 22.8*  MCV 88.5 86.5 86.0  PLT 380 346 359      Medications:    . B-complex with vitamin C  1 tablet Oral Daily   . brimonidine  1 drop Both Eyes TID  . brinzolamide  1 drop Both Eyes TID  . chlorhexidine gluconate (MEDLINE KIT)  15 mL Mouth Rinse BID  . Chlorhexidine Gluconate Cloth  6 each Topical Daily  . Chlorhexidine Gluconate Cloth  6 each Topical Q0600  . clonazepam  0.25 mg Oral Daily  . collagenase   Topical Daily  . darbepoetin (ARANESP) injection - DIALYSIS  150 mcg Intravenous Q Thu-HD  . feeding supplement (GLUCERNA SHAKE)  237 mL Oral TID WC  . heparin  5,000 Units Subcutaneous Q8H  . heparin sodium (porcine)      . hydrocerin   Topical Daily  . insulin aspart  0-6 Units Subcutaneous TID AC & HS  . insulin glargine  4 Units Subcutaneous QHS  . latanoprost  1 drop Both Eyes q AM  . levothyroxine  125 mcg Oral Q0600  . midodrine  10 mg Oral TID WC  . multivitamin  1 tablet Oral QHS  . nutrition supplement (JUVEN)  1 packet Oral BID BM  . pantoprazole  40 mg Oral  Daily  . QUEtiapine  50 mg Oral QHS  . sevelamer carbonate  1,600 mg Oral TID WC     Madelon Lips, MD 05/22/2020, 3:38 PM

## 2020-05-22 NOTE — Plan of Care (Signed)
  Problem: Education: Goal: Knowledge of General Education information will improve Description: Including pain rating scale, medication(s)/side effects and non-pharmacologic comfort measures 05/22/2020 2310 by Felix Pacini, LPN Outcome: Progressing 05/22/2020 2309 by Felix Pacini, LPN Outcome: Progressing   Problem: Health Behavior/Discharge Planning: Goal: Ability to manage health-related needs will improve 05/22/2020 2310 by Felix Pacini, LPN Outcome: Progressing 05/22/2020 2309 by Felix Pacini, LPN Outcome: Progressing   Problem: Clinical Measurements: Goal: Ability to maintain clinical measurements within normal limits will improve 05/22/2020 2310 by Felix Pacini, LPN Outcome: Progressing 05/22/2020 2309 by Felix Pacini, LPN Outcome: Progressing Goal: Will remain free from infection 05/22/2020 2310 by Felix Pacini, LPN Outcome: Progressing 05/22/2020 2309 by Felix Pacini, LPN Outcome: Progressing Goal: Diagnostic test results will improve 05/22/2020 2310 by Felix Pacini, LPN Outcome: Progressing 05/22/2020 2309 by Felix Pacini, LPN Outcome: Progressing Goal: Respiratory complications will improve 05/22/2020 2310 by Felix Pacini, LPN Outcome: Progressing 05/22/2020 2309 by Felix Pacini, LPN Outcome: Progressing Goal: Cardiovascular complication will be avoided 05/22/2020 2310 by Felix Pacini, LPN Outcome: Progressing 05/22/2020 2309 by Felix Pacini, LPN Outcome: Progressing   Problem: Activity: Goal: Risk for activity intolerance will decrease 05/22/2020 2310 by Felix Pacini, LPN Outcome: Progressing 05/22/2020 2309 by Felix Pacini, LPN Outcome: Progressing   Problem: Nutrition: Goal: Adequate nutrition will be maintained 05/22/2020 2310 by Felix Pacini, LPN Outcome: Progressing 05/22/2020 2309 by Felix Pacini, LPN Outcome: Progressing   Problem: Coping: Goal: Level of anxiety will decrease 05/22/2020 2310 by Felix Pacini, LPN Outcome:  Progressing 05/22/2020 2309 by Felix Pacini, LPN Outcome: Progressing   Problem: Elimination: Goal: Will not experience complications related to bowel motility 05/22/2020 2310 by Felix Pacini, LPN Outcome: Progressing 05/22/2020 2309 by Felix Pacini, LPN Outcome: Progressing Goal: Will not experience complications related to urinary retention 05/22/2020 2310 by Felix Pacini, LPN Outcome: Progressing 05/22/2020 2309 by Felix Pacini, LPN Outcome: Progressing   Problem: Pain Managment: Goal: General experience of comfort will improve 05/22/2020 2310 by Felix Pacini, LPN Outcome: Progressing 05/22/2020 2309 by Felix Pacini, LPN Outcome: Progressing   Problem: Safety: Goal: Ability to remain free from injury will improve 05/22/2020 2310 by Felix Pacini, LPN Outcome: Progressing 05/22/2020 2309 by Felix Pacini, LPN Outcome: Progressing   Problem: Skin Integrity: Goal: Risk for impaired skin integrity will decrease 05/22/2020 2310 by Felix Pacini, LPN Outcome: Progressing 05/22/2020 2309 by Felix Pacini, LPN Outcome: Progressing

## 2020-05-23 ENCOUNTER — Inpatient Hospital Stay (HOSPITAL_COMMUNITY): Payer: Medicaid Other | Admitting: Occupational Therapy

## 2020-05-23 ENCOUNTER — Inpatient Hospital Stay (HOSPITAL_COMMUNITY): Payer: Medicaid Other | Admitting: Physical Therapy

## 2020-05-23 LAB — GLUCOSE, CAPILLARY
Glucose-Capillary: 132 mg/dL — ABNORMAL HIGH (ref 70–99)
Glucose-Capillary: 196 mg/dL — ABNORMAL HIGH (ref 70–99)
Glucose-Capillary: 179 mg/dL — ABNORMAL HIGH (ref 70–99)
Glucose-Capillary: 191 mg/dL — ABNORMAL HIGH (ref 70–99)

## 2020-05-23 NOTE — Patient Care Conference (Signed)
Inpatient RehabilitationTeam Conference and Plan of Care Update Date: 05/23/2020   Time: 10:02 AM    Patient Name: Wanda Ramirez      Medical Record Number: [id number]  Date of Birth: [birth year] Sex: Female         Room/Bed: 4W05C/4W05C-01 Payor Info: Payor: MEDICAID [place] / Plan: MEDICAID  ACCESS / Product Type: *No Product type* /    Admit Date/Time:  05/15/2020  4:47 PM  Primary Diagnosis:  [facility] Problems: Principal Problem:   Debility Active Problems:   End stage renal disease on dialysis ([place])   COVID-19   Pressure injury of skin   Sacral wound    Expected Discharge Date: Expected Discharge Date: 05/30/20  Team Members Present: Physician leading conference: Dr. Alysia Penna Care Coodinator Present: Dorien Chihuahua, RN, BSN, CRRN;Christina [place], [place] Nurse Present: Rayne Du, LPN PT Present: Phylliss Bob, PTA OT Present: Willeen Cass, OT PPS Coordinator present : Gunnar Fusi, Novella Olive, PT     Current Status/Progress Goal Weekly Team Focus  Bowel/Bladder             Swallow/Nutrition/ Hydration             ADL's             Mobility   supervision bed mobility with use of bed features, CGA to supervision transfers with RW, CGA gait with RW ~53ft  supervision bed mobility, ambulation, and wc propulsion, min assist 12 stairs, mod-I transfers  general conditioning, stairs, endurance, family ed   Communication             Safety/Cognition/ Behavioral Observations            Pain             Skin               Discharge Planning:  Discharging home with daughter to continue care (CAPS Aide). 2 level apartment, 1/2 bath on main level, bed and bath upstairs (12 steps)   Team Discussion: Daily dressing changes painful; managed with prn medications. Remains O2 dependent Patient on target to meet rehab goals: yes, currently CGA overall   *See Care Plan and progress notes for long and short-term goals.    Revisions to Treatment Plan:   Teaching Needs: Wound care, transfers, toileting, steps, etc.  Current Barriers to Discharge: Decreased caregiver support, Home enviroment access/layout and Wound care  Possible Resolutions to Barriers: Family education with daughter, change disposition for discharge with less steps to entrance    Medical Summary Current Status: Sacral wound with 3 times daily dressing changes wet to dry, no respiratory issues requires oxygen with therapy.  Barriers to Discharge: Weight;Wound care;Hemodialysis   Possible Resolutions to [employer] Focus: Continue aggressive wound care, continue current midodrine dose, hemodialysis as per nephrology   Continued Need for Acute Rehabilitation Level of Care: The patient requires daily medical management by a physician with specialized training in physical medicine and rehabilitation for the following reasons: Direction of a multidisciplinary physical rehabilitation program to maximize functional independence : Yes Medical management of patient stability for increased activity during participation in an intensive rehabilitation regime.: Yes Analysis of laboratory values and/or radiology reports with any subsequent need for medication adjustment and/or medical intervention. : Yes   I attest that I was present, lead the team conference, and concur with the assessment and plan of the team.   Dorien Chihuahua B 05/23/2020, 1:17 PM

## 2020-05-23 NOTE — Progress Notes (Signed)
Patient ID: Wanda Ramirez, female   DOB: [birth year], 49 y.o.   MRN: [id number] Team Conference Report to Patient/Family  Team Conference discussion was reviewed with the patient and caregiver, including goals, any changes in plan of care and target discharge date.  Patient and caregiver express understanding and are in agreement.  The patient has a target discharge date of 05/30/20.  Wanda Ramirez 05/23/2020, 1:21 PM

## 2020-05-23 NOTE — Progress Notes (Signed)
Physical Therapy Session Note  Patient Details  Name: Wanda Ramirez MRN: [id number] Date of Birth: 02/03/1971  Today's Date: 05/23/2020 PT Individual Time: 0803-0900 PT Individual Time Calculation (min): 57 min   Short Term Goals: Week 1:  PT Short Term Goal 1 (Week 1): pt will perform all transfers with CGA PT Short Term Goal 2 (Week 1): pt will perform bed mobility with CGA PT Short Term Goal 3 (Week 1): pt will ambulate ~35f with CGA and LRAD PT Short Term Goal 4 (Week 1): pt will initiate stair training  Skilled Therapeutic Interventions/Progress Updates: [facility]. Pt states pain more controlled this am however feels fatigued from HD. Performed peri-care with supervision and performed stand pivot to bed with use of RW and supervision. Pt donned pants with minA for time management and energy conservation. Pt donned top mod I. Performed ambulatory transfer to w/c with RW and transported to sink for oral hygiene performed at mod I. Pt transported to day room and performed ambulation with rollator, pt only tolerating ambulation approx 179fthen states felt weak. Vitals checked HR 105 SpO2 100%, BP 95/68 (77). Deferred ambulation as pt will have additional PT today. In remaining session pt participated in seated [place]Pt participated in SwForsannd CaColerainctivities with pt requiring intermittent rest breaks. Max HR with activities noted 118 with pt only stating some fatigue in BUE. Pt transported back to room at end of session and agreeable to remain in w/c until next session (30 min break). Pt left in w/c with seat alarm, call bell within reach and current needs met.      Therapy Documentation Precautions:  Precautions Precautions: Fall,Other (comment) Precaution Comments: 3L O2 with mobility, sacral wound, trach Restrictions Weight Bearing Restrictions: No   Therapy/Group: Individual Therapy  Elray Dains  Argus Caraher, PTA  05/23/2020, 12:40 PM

## 2020-05-23 NOTE — Progress Notes (Signed)
Physical Therapy Session Note  Patient Details  Name: Wanda Ramirez MRN: [id number] Date of Birth: Nov 10, 1970  Today's Date: 05/23/2020 PT Individual Time: 4193-7902 PT Individual Time Calculation (min): 50 min   Short Term Goals: Week 1:  PT Short Term Goal 1 (Week 1): pt will perform all transfers with CGA PT Short Term Goal 2 (Week 1): pt will perform bed mobility with CGA PT Short Term Goal 3 (Week 1): pt will ambulate ~74f with CGA and LRAD PT Short Term Goal 4 (Week 1): pt will initiate stair training Week 2:    Week 3:     Skilled Therapeutic Interventions/Progress Updates:   Pt received supine in bed and agreeable to PT. Supine>sit transfer with supervision assist for safety. Stand [facility] supervision assist and UE support on RW. Treatment session focused on functional endurance. Pt transported to day room. Standing tolerance perform while engaged in wii resort speed slice, dual, and bowling. Pt able to tolerate standing 45 sec to 166m 15 sec with 1-2 UE support on RW and then multiple prolonged rest break between bouts, HR remaind ~110 throughout session following each bout of standing. Gait training in room to recliner with supervision assist x [id number] returned to room and left sitting in recliner with call bell in reach and all needs met.        Therapy Documentation Precautions:  Precautions Precautions: Fall,Other (comment) Precaution Comments: 3L O2 with mobility, sacral wound, trach Restrictions Weight Bearing Restrictions: No  Vital Signs: Therapy Vitals Temp: 98.9 F (37.2 C) Temp Source: Oral Pulse Rate: 97 Resp: 15 BP: (!) 91/59 Patient Position (if appropriate): Lying Oxygen Therapy SpO2: 99 % O2 Device: Room Air Pain:   denies   Therapy/Group: Individual Therapy  AusLorie Phenix/22/2021, 5:34 PM

## 2020-05-23 NOTE — Progress Notes (Signed)
[facility] Progress Note   Assessment/ Plan:   OP HD:TTS  4h 49mn 450/800 2/2.25 bath 111.5kg Hep 2000 TDC  - L AVF clotted while here, using TDC now  - darbe 50 ug q week, last 9/7 - calc 1.0 tiw - 9/9 Hb 10.0, tsat 22%  Problems: 1. COVID pna / sp trach / MSSA PNA - off vent on trach collar. Trach removed.  2. Hypotension - BP's stable on midodrine 10 tid. Monitor BP. 3. Stage IV sacral decub - sp I&D x 2.  SP meropenem course.Wound care, general surgery is following, wound is improving.  4. ESRD - S/P CRRT, now HD TTS.On schedule. 5. Anemia ckd/ critical illness - on darbe 150 weekly, 11/12 Ferritin >4000, iron sat 11. With active infection and ^^ ferritin no IV iron. tx prbc's PRN. Rechecked iron panel-- Ferritin 2777 and %sat 18, hold off on IV iron for now. 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 Monitor 7. HD access: LUA AVF clotted here during acute covid. TDC x2 per IR 8. Dispo: now on CIR   Subjective:    Seen in room.  Doing well.  Needs to work on sitting in chair for 4 hrs to approximate the time she will need to do this for dialysis.     Objective:   BP (!) 91/59 (BP Location: Right Arm)   Pulse 97   Temp 98.9 F (37.2 C) (Oral)   Resp 15   Ht _0  (1.575 m)   Wt 103.2 kg   SpO2 99%   BMI 41.61 kg/m   Physical Exam: Gen: NAD, lying in bed CVS: RRR Resp: clear Abd: soft, nontender Ext: no LE edema  Labs: BMET Recent Labs  Lab 05/17/20 1528 05/19/20 1547 05/22/20 1232  NA 136 132* 132*  K 5.2* 5.4* 4.5  CL 93* 91* 93*  CO2 _1 GLUCOSE 165* 214* 166*  BUN 81* 63* 60*  CREATININE 7.54* 7.17* 7.40*  CALCIUM 10.8* 10.3 9.9  PHOS 6.8* 5.4* 3.5   CBC Recent Labs  Lab 05/17/20 1528 05/19/20 0447 05/22/20 1232  WBC 14.0* 10.8* 10.1  NEUTROABS  --  5.5  --   HGB 7.3* 8.0* 7.3*  HCT 23.1* 24.9* 22.8*  MCV 88.5 86.5 86.0  PLT 380 346 359      Medications:    .  B-complex with vitamin C  1 tablet Oral Daily  . brimonidine  1 drop Both Eyes TID  . brinzolamide  1 drop Both Eyes TID  . chlorhexidine gluconate (MEDLINE KIT)  15 mL Mouth Rinse BID  . Chlorhexidine Gluconate Cloth  6 each Topical Daily  . Chlorhexidine Gluconate Cloth  6 each Topical Q0600  . clonazepam  0.25 mg Oral Daily  . collagenase   Topical Daily  . darbepoetin (ARANESP) injection - DIALYSIS  150 mcg Intravenous Q Thu-HD  . feeding supplement (GLUCERNA SHAKE)  237 mL Oral TID WC  . heparin  5,000 Units Subcutaneous Q8H  . hydrocerin   Topical Daily  . insulin aspart  0-6 Units Subcutaneous TID AC & HS  . insulin glargine  4 Units Subcutaneous QHS  . latanoprost  1 drop Both Eyes q AM  . levothyroxine  125 mcg Oral Q0600  . midodrine  10 mg Oral TID WC  . multivitamin  1 tablet Oral QHS  . nutrition supplement (JUVEN)  1 packet Oral BID BM  . pantoprazole  40 mg Oral  Daily  . QUEtiapine  50 mg Oral QHS  . sevelamer carbonate  1,600 mg Oral TID WC     Madelon Lips, MD 05/23/2020, 3:15 PM

## 2020-05-23 NOTE — Progress Notes (Signed)
Dressing change  on left buttock. Stage 4 wound, shallow wound, pink, moist;  Wet to dry.

## 2020-05-23 NOTE — Progress Notes (Signed)
[facility] PHYSICAL MEDICINE & REHABILITATION PROGRESS NOTE   Subjective/Complaints: Patient states that sacral pain has been improving.  Review of medication l administration demonstrates approximately 2 oxycodones taken per day.  ROS- Pt denies SOB, abd pain, CP, N/V/C/D, and vision changes, + pain at pressure injury site.    Objective:   No results found. Recent Labs    05/22/20 1232  WBC 10.1  HGB 7.3*  HCT 22.8*  PLT 359   Recent Labs    05/22/20 1232  NA 132*  K 4.5  CL 93*  CO2 23  GLUCOSE 166*  BUN 60*  CREATININE 7.40*  CALCIUM 9.9    Intake/Output Summary (Last 24 hours) at 05/23/2020 1003 Last data filed at 05/23/2020 0810 Gross per 24 hour  Intake 600 ml  Output 1800 ml  Net -1200 ml        Physical Exam: Vital Signs Blood pressure 115/73, pulse 97, temperature 98.7 F (37.1 C), temperature source Oral, resp. rate 16, height $RemoveBe'5\' 2"'xTRzuzpGO$  (1.575 m), weight 103.2 kg, SpO2 96 %. HEENT: healed trach    General: No acute distress Mood and affect are appropriate Heart: Regular rate and rhythm no rubs murmurs or extra sounds Lungs: Clear to auscultation, breathing unlabored, no rales or wheezes Abdomen: Positive bowel sounds, soft nontender to palpation, nondistended Extremities: No clubbing, cyanosis, or edema   Skin: Sacral decubitus     05/23/2020 Neurologic: Cranial nerves II through XII intact, motor strength is 4/5 in bilateral deltoid, bicep, tricep, grip, hip flexor, knee extensors, ankle dorsiflexor and plantar flexor Sensory exam able to sense LT but reports numbness RIght little finger and Left toes Cerebellar exam normal finger to nose to finger as well as heel to shin in bilateral upper and lower extremities Musculoskeletal: Full range of motion in all 4 extremities. No joint swelling    Assessment/Plan: 1. Functional deficits which require 3+ hours per day of interdisciplinary therapy in a comprehensive inpatient rehab  setting.  Physiatrist is providing close team supervision and 24 hour management of active medical problems listed below.  Physiatrist and rehab team continue to assess barriers to discharge/monitor patient progress toward functional and medical goals  Care Tool:  Bathing    Body parts bathed by patient: Face,Right lower leg,Left upper leg,Left arm,Right arm,Right upper leg,Chest,Front perineal area,Abdomen,Left lower leg   Body parts bathed by helper: Buttocks,Left lower leg     Bathing assist Assist Level: Minimal Assistance - Patient > 75%     Upper Body Dressing/Undressing Upper body dressing   What is the patient wearing?: Pull over shirt    Upper body assist Assist Level: Minimal Assistance - Patient > 75%    Lower Body Dressing/Undressing Lower body dressing      What is the patient wearing?: Pants     Lower body assist Assist for lower body dressing: Contact Guard/Touching assist     Toileting Toileting    Toileting assist Assist for toileting: Minimal Assistance - Patient > 75%     Transfers Chair/bed transfer  Transfers assist     Chair/bed transfer assist level: Minimal Assistance - Patient > 75%     Locomotion Ambulation   Ambulation assist      Assist level: Minimal Assistance - Patient > 75% Assistive device: Walker-rolling Max distance: 28ft   Walk 10 feet activity   Assist     Assist level: Minimal Assistance - Patient > 75% Assistive device: Walker-rolling   Walk 50 feet activity   Assist Walk  50 feet with 2 turns activity did not occur: Safety/medical concerns         Walk 150 feet activity   Assist Walk 150 feet activity did not occur: Safety/medical concerns         Walk 10 feet on uneven surface  activity   Assist Walk 10 feet on uneven surfaces activity did not occur: Safety/medical concerns         Wheelchair     Assist Will patient use wheelchair at discharge?: Yes Type of Wheelchair:  Manual    Wheelchair assist level: Supervision/Verbal cueing Max wheelchair distance: 171f    Wheelchair 50 feet with 2 turns activity    Assist        Assist Level: Supervision/Verbal cueing   Wheelchair 150 feet activity     Assist      Assist Level: Moderate Assistance - Patient 50 - 74%   Blood pressure 115/73, pulse 97, temperature 98.7 F (37.1 C), temperature source Oral, resp. rate 16, height _0  (1.575 m), weight 103.2 kg, SpO2 96 %.    Medical Problem List and Plan: 1.  Functional and mobility deficits secondary to debility after COVID and associated complications including VDRF/trach and large sacral wound Has neuropathy, mononeuropathy multiplex pattern, no sig neurogenic pain             -patient may not yet shower             -ELOS/Goals: 14-21 days, mod I PT, min assist OT,   Continue CIR Team conference today please see physician documentation under team conference tab, met with team  to discuss problems,progress, and goals. Formulized individual treatment plan based on medical history, underlying problem and comorbidities.2.  Antithrombotics: -DVT/anticoagulation:  Pharmaceutical: Heparin             -antiplatelet therapy: N/A 3. Pain Management: Continue Oxycodone prn.  12/18- pt reports gets her pain meds for sacral decub after HD- con't regimen  4. Mood: LCSW to follow for evaluation and support.              -antipsychotic agents: N/A 5. Neuropsych: This patient is capable of making decisions on her own behalf--dicussed code status -->SHE WANTS TO BE FULL CODE . 6.Large stage IV sacral/buttock wound/Wound Care: Meropenum completed 05/13/20.     -Juven tid, glucerna tid and vitamin B w/C.               -Santyl to eschar on inferior flap and wet to dry dressing changes bid and prn to keep wound clean of stool to prevent contamination.              -Pressure relief measure with air mattress.                -WBC trending upwards--recheck in am    12/18- WBC down to 10.8 from 14k- will monitor 7. Fluids/Electrolytes/Nutrition: Renal diet with 1200 cc FR. Strict I/O with daily weights.              -check labs with HD 8.T2DM: On glucerna tid with meals. Monitor BS ac/hs--On Lantus 3 units daily with SSI for elevated BS.  CBG (last 3)  Recent Labs    05/22/20 1801 05/22/20 2108 05/23/20 0617  GLUCAP 155* 219* 132*  12/20: CBGs 150s-180s: continue current regimen  9. Hypotension: Monitor BP tid--especially with increase in activity. Continue Midodrine 10 mg tid.  Vitals:   05/22/20 2026 05/23/20 0524  BP: 106/63 115/73  Pulse: 96  97  Resp: 15 16  Temp: 98.8 F (37.1 C) 98.7 F (37.1 C)  SpO2: 100% 96%  Controlled on Midodrine 6m TID Hypthyroid: Stable on supplement.  10. ESRD:  HD -TTS with nephrology to assist with management. Schedule HD at the end of the day to help with tolerance of activity. Sevelmar tid for metabolic bone disease. Hyperkalemia managed with HD.  Per Nephro 11. Anemia of critical illness/anemia of CKD: On Aranesp 150 mc/week. Hgb stable at 7.2 range past week.  Transfuse prn Hgb<7.0.    12/18- Hb up to 8.0- con't regimen 12.  Anxiety: Continue low dose klonopin in am and Seroquel at nights to help with sleep.  13. Leukocytosis.: Given SOB, ordered CXR 12/16 which was stable. WBC increased further to 14 on 12/17. UA/UC ordered and repeat CBC ordered for 12/18  12/18- WBC down to 10.8- con't to monitor for clinical Sx's of infection   LOS: 8 days A FACE TO FACE EVALUATION WAS PERFORMED  ACharlett Blake12/22/2021, 10:03 AM

## 2020-05-23 NOTE — Progress Notes (Signed)
Occupational Therapy Session Note  Patient Details  Name: Wanda Ramirez MRN: [id number] Date of Birth: 04/29/1971  Today's Date: 05/23/2020 OT Individual Time: 2878-6767 and 2094-7096 OT Individual Time Calculation (min): 73 min and 27 min   Short Term Goals: Week 1:  OT Short Term Goal 1 (Week 1): Pt will don BLE into pants with min A. OT Short Term Goal 2 (Week 1): Pt will complete LB bathing with min A. OT Short Term Goal 3 (Week 1): Pt will complete sit<>stand in prep for standing ADLs with close supervision.   Skilled Therapeutic Interventions/Progress Updates:    Pt greeted at time of session sitting up in wheelchair agreeable to OT session, no pain throughout. Transported to gym dependent for time management, portable O2 taken but did not need throughout - able to increase sats with rest breaks and pursed lip breathing throughout session. Pt performed seated SCIFIT for endurance and activity tolerance on level 2 for 9 minutes total broken into 3 minute intervals with rest breaks in between. Pt performed x3 rounds of standing BITS game for at first reaching for red/white/blue spheres in rotating pattern but this was too difficult, downgraded to simple dynamic standing with single colored white circles in various planes. Note pt able to stand up to 1 minute. O2 sats checked throughout, dropped to 90% but increased back to 95% or higher with pursed lip breathing. Pt needing to use bathroom at end of session, stand pivot CGA  to toilet and performed clothing management with Min A d/t fatigue, extended time on toilet and unable to void. Stand pivot toilet > w/c > bed CGA all with RW. Sit to supine Supervision and call bell in reach.   Session 2: Pt greeted at time of session supine in bed resting but agreeable to OT session, pleasant and cooperative. Session focused on BUE therex with 3# dowel for chest press, bicep curl, overhead press, forward circle and backward circle with reps of 10-15  and visual cues for form. Supine <> sit Supervision and stand pivot bed <> wheelchair with CGA for better form during there ex. Once back in bed set up with alarm on call bell in reach.   Therapy Documentation Precautions:  Precautions Precautions: Fall,Other (comment) Precaution Comments: 3L O2 with mobility, sacral wound, trach Restrictions Weight Bearing Restrictions: No    Therapy/Group: Individual Therapy  Viona Gilmore 05/23/2020, 10:36 AM

## 2020-05-24 ENCOUNTER — Inpatient Hospital Stay (HOSPITAL_COMMUNITY): Payer: Medicaid Other | Admitting: Physical Therapy

## 2020-05-24 ENCOUNTER — Inpatient Hospital Stay (HOSPITAL_COMMUNITY): Payer: Medicaid Other

## 2020-05-24 ENCOUNTER — Inpatient Hospital Stay (HOSPITAL_COMMUNITY): Payer: Medicaid Other | Admitting: Occupational Therapy

## 2020-05-24 LAB — CBC
HCT: 22.3 % — ABNORMAL LOW (ref 36.0–46.0)
Hemoglobin: 7.1 g/dL — ABNORMAL LOW (ref 12.0–15.0)
MCH: 27.6 pg (ref 26.0–34.0)
MCHC: 31.8 g/dL (ref 30.0–36.0)
MCV: 86.8 fL (ref 80.0–100.0)
Platelets: 327 10*3/uL (ref 150–400)
RBC: 2.57 MIL/uL — ABNORMAL LOW (ref 3.87–5.11)
RDW: 15 % (ref 11.5–15.5)
WBC: 13.6 10*3/uL — ABNORMAL HIGH (ref 4.0–10.5)
nRBC: 0 % (ref 0.0–0.2)

## 2020-05-24 LAB — GLUCOSE, CAPILLARY
Glucose-Capillary: 137 mg/dL — ABNORMAL HIGH (ref 70–99)
Glucose-Capillary: 183 mg/dL — ABNORMAL HIGH (ref 70–99)
Glucose-Capillary: 157 mg/dL — ABNORMAL HIGH (ref 70–99)
Glucose-Capillary: 190 mg/dL — ABNORMAL HIGH (ref 70–99)

## 2020-05-24 LAB — RENAL FUNCTION PANEL
Albumin: 2.5 g/dL — ABNORMAL LOW (ref 3.5–5.0)
Anion gap: 16 — ABNORMAL HIGH (ref 5–15)
BUN: 55 mg/dL — ABNORMAL HIGH (ref 6–20)
CO2: 22 mmol/L (ref 22–32)
Calcium: 10.1 mg/dL (ref 8.9–10.3)
Chloride: 92 mmol/L — ABNORMAL LOW (ref 98–111)
Creatinine, Ser: 7.16 mg/dL — ABNORMAL HIGH (ref 0.44–1.00)
GFR, Estimated: 7 mL/min — ABNORMAL LOW (ref >60)
Glucose, Bld: 204 mg/dL — ABNORMAL HIGH (ref 70–99)
Phosphorus: 3.8 mg/dL (ref 2.5–4.6)
Potassium: 4.6 mmol/L (ref 3.5–5.1)
Sodium: 130 mmol/L — ABNORMAL LOW (ref 135–145)

## 2020-05-24 MED ORDER — HEPARIN SODIUM (PORCINE) 1000 UNIT/ML IJ SOLN
INTRAMUSCULAR | Status: AC
  Administered 2020-05-24: 4200 [IU] via INTRAVENOUS_CENTRAL
  Filled 2020-05-24: qty 5

## 2020-05-24 MED ORDER — DARBEPOETIN ALFA 150 MCG/0.3ML IJ SOSY
PREFILLED_SYRINGE | INTRAMUSCULAR | Status: AC
  Administered 2020-05-24: 150 ug via INTRAVENOUS
  Filled 2020-05-24: qty 0.3

## 2020-05-24 NOTE — Progress Notes (Signed)
Occupational Therapy Session Note  Patient Details  Name: Wanda Ramirez MRN: [id number] Date of Birth: 1971-02-28  Today's Date: 05/24/2020 OT Individual Time: 1107-1200 OT Individual Time Calculation (min): 53 min    Short Term Goals: Week 1:  OT Short Term Goal 1 (Week 1): Pt will don BLE into pants with min A. OT Short Term Goal 2 (Week 1): Pt will complete LB bathing with min A. OT Short Term Goal 3 (Week 1): Pt will complete sit<>stand in prep for standing ADLs with close supervision.  Skilled Therapeutic Interventions/Progress Updates:    Pt worked on shower, dressing, and toileting during session.  Had her first transfer to the EOB with supervision using the rail.  She was able to complete functional mobility with use of the rollator and min assist to work on gathering towels and supplies with increased time and rest breaks on the rollator.  She needed rest breaks after standing/ambulating for approximately 30-45 seconds.  HR increased to 115 with O2 sats at 96% on room air.  She was able to work on bathing and dressing at the sink with supervision for UB bathing and min assist for LB.  She was unable to reach the LLE to wash her lower leg and foot, but was able to cross the RLE over the left knee for washing it with supervision.  She donned gripper sock on the left foot as with setup but needed total assist for donning the left.  She was able to complete transfer to the toilet during bathing as well to void with min assist using the rollator.  Min guard for completion of clothing management and hygiene sit to stand.  Finished session with pt transferring back to the bed at min guard and then to supine to rest.  Call button and phone in reach.    Therapy Documentation Precautions:  Precautions Precautions: Fall,Other (comment) Precaution Comments: Sacral wound Restrictions Weight Bearing Restrictions: No   Pain: Pain Assessment Faces Pain Scale: Hurts a little bit Pain Type:  Acute pain Pain Location: Buttocks Pain Orientation: Left Pain Descriptors / Indicators: Discomfort Pain Onset: On-going Pain Intervention(s): Repositioned;Emotional support ADL: See Care Tool Section for some details of mobility and selfcare  Therapy/Group: Individual Therapy  Keasia Dubose OTR/L 05/24/2020, 1:00 PM

## 2020-05-24 NOTE — Progress Notes (Signed)
Physical Therapy Session Note  Patient Details  Name: Wanda Ramirez MRN: [id number] Date of Birth: [birth year]  Today's Date: 05/24/2020 PT Individual Time: 4827-0786 PT Individual Time Calculation (min): 58 min   Short Term Goals: Week 1:  PT Short Term Goal 1 (Week 1): pt will perform all transfers with CGA PT Short Term Goal 2 (Week 1): pt will perform bed mobility with CGA PT Short Term Goal 3 (Week 1): pt will ambulate ~79ft with CGA and LRAD PT Short Term Goal 4 (Week 1): pt will initiate stair training  Skilled Therapeutic Interventions/Progress Updates:    Patient received supine in bed, agreeable to PT. She reports "moderate pain" at the site of her sacral wound, but reports that she will ask for pain rx after session. PT providing repositioning, rest breaks and distractions to assist with pain management. She was able to come to sit edge of bed with supervision and stand pivot transfer to wc using RW and CGA. PT propelling patient to therapy gym for time management and energy conservation. Patient ambulating 23ft x3 with 2L 02 via [place], RW + CGA. Seated rest breaks between bouts. Patients O2 remained >95% throughout therapy session; HR fluctuating between 110-117 immediately after walking with recovery to <100BPM after 2 mins seated rest. Patient completing standing endurance task assembling Lego blocks based on picture. She was able to remain standing for ~2 mins before requiring seated rest break due to fatigue. She had significant difficulty completing building task with deficits noted in visuospatial awareness, 3D vs 2D imaging and problem solving. When PT constructed correct figure, she was able to identify that it was correct based on picture. Patient returning to room in wc, transferring to bed via stand pivot with RW and close supervision. Patient remaining in bed, bed alarm on, call light within reach.   Therapy Documentation Precautions:  Precautions Precautions: Fall,Other  (comment) Precaution Comments: 3L O2 with mobility, sacral wound, trach Restrictions Weight Bearing Restrictions: No    Therapy/Group: Individual Therapy  Karoline Caldwell, PT, DPT, CBIS  05/24/2020, 7:37 AM

## 2020-05-24 NOTE — Progress Notes (Signed)
[facility] Progress Note   Assessment/ Plan:   OP HD:TTS  4h 15min 450/800 2/2.25 bath 111.5kg Hep 2000 TDC  - L AVF clotted while here, using TDC now  - darbe 50 ug q week, last 9/7 - calc 1.0 tiw - 9/9 Hb 10.0, tsat 22%  Problems: 1. COVID pna / sp trach / MSSA PNA - off vent on trach collar. Trach removed.  2. Hypotension - BP's stable on midodrine 10 tid. Monitor BP. 3. Stage IV sacral decub - sp I&D x 2.  SP meropenem course., had woundvac too, now .  4. ESRD - S/P CRRT, now HD TTS.On schedule. 5. Anemia ckd/ critical illness - on darbe 150 weekly, 11/12 Ferritin >4000, iron sat 11. With active infection and ^^ ferritin no IV iron. tx prbc's PRN. Rechecked iron panel-- Ferritin 2777 and %sat 18, hold off on IV iron for now. 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 Monitor 7. HD access: LUA AVF clotted here during acute covid. TDC x2 per IR 8. Dispo: now on CIR.     Subjective:    For HD today.  Discussed sitting in the chair for as long as she can too.  She is doing well with her therapies and is in good spirits.     Objective:   BP 113/88 (BP Location: Right Leg)   Pulse 96   Temp 98.3 F (36.8 C)   Resp 17   Ht 5' 2" (1.575 m)   Wt 103.2 kg   SpO2 98%   BMI 41.61 kg/m   Physical Exam: Gen: NAD, lying in bed CVS: RRR Resp: clear Abd: soft, nontender Ext: no LE edema  Labs: BMET Recent Labs  Lab 05/17/20 1528 05/19/20 1547 05/22/20 1232  NA 136 132* 132*  K 5.2* 5.4* 4.5  CL 93* 91* 93*  CO2 25 24 23  GLUCOSE 165* 214* 166*  BUN 81* 63* 60*  CREATININE 7.54* 7.17* 7.40*  CALCIUM 10.8* 10.3 9.9  PHOS 6.8* 5.4* 3.5   CBC Recent Labs  Lab 05/17/20 1528 05/19/20 0447 05/22/20 1232  WBC 14.0* 10.8* 10.1  NEUTROABS  --  5.5  --   HGB 7.3* 8.0* 7.3*  HCT 23.1* 24.9* 22.8*  MCV 88.5 86.5 86.0  PLT 380 346 359      Medications:    . B-complex with vitamin C  1 tablet Oral Daily  .  brimonidine  1 drop Both Eyes TID  . brinzolamide  1 drop Both Eyes TID  . chlorhexidine gluconate (MEDLINE KIT)  15 mL Mouth Rinse BID  . Chlorhexidine Gluconate Cloth  6 each Topical Daily  . Chlorhexidine Gluconate Cloth  6 each Topical Q0600  . clonazepam  0.25 mg Oral Daily  . collagenase   Topical Daily  . darbepoetin (ARANESP) injection - DIALYSIS  150 mcg Intravenous Q Thu-HD  . feeding supplement (GLUCERNA SHAKE)  237 mL Oral TID WC  . heparin  5,000 Units Subcutaneous Q8H  . hydrocerin   Topical Daily  . insulin aspart  0-6 Units Subcutaneous TID AC & HS  . insulin glargine  4 Units Subcutaneous QHS  . latanoprost  1 drop Both Eyes q AM  . levothyroxine  125 mcg Oral Q0600  . midodrine  10 mg Oral TID WC  . multivitamin  1 tablet Oral QHS  . nutrition supplement (JUVEN)  1 packet Oral BID BM  . pantoprazole  40 mg Oral Daily  .   QUEtiapine  50 mg Oral QHS  . sevelamer carbonate  1,600 mg Oral TID WC      , MD 05/24/2020, 11:08 AM  

## 2020-05-24 NOTE — Progress Notes (Signed)
Physical Therapy Session Note  Patient Details  Name: Wanda Ramirez MRN: [id number] Date of Birth: [birth year]  Today's Date: 05/24/2020 PT Individual Time: 0805-0900 PT Individual Time Calculation (min): 55 min   Short Term Goals: Week 2:  PT Short Term Goal 1 (Week 2): STG=LTG due to ELOS  Skilled Therapeutic Interventions/Progress Updates:   Pt received supine in bed and agreeable to PT. Supine>sit transfer without assist from PT. Upper and lower body drdessing with set up assist from PT. Pt able to pull pants to waist in standign with supervision assist from PT and 1 UE support on RW. Stand pivot transfer to [facility] with RW and no O2. VS assessed folowing transfer HR 115, O2 94% pt then placed on 2L,/min O2 and O2 increased to 100%.    WC mobility without assist from PT through halls x157f, pt nearly hit wall on R side but able to correct without cues. Noted to have Improved BUE propulsion technique with increased speed and use of momentum throughout.   Supplemental O2 increased to 3L/min. Gait training in rehab gym 524fand 2568fith RW and supervision assist. Vital signs assessed following HR 125, O2 100%; 30-45 sec rest break to decrease to 115 following each bout of gait training.   PT instructed pt in standing therex:  Hip abduction x 8 March x 8 Calf raise x 10  Partial squat x 8  Cues for posture, decreaesd speed and decreased compensatory movements intermittently throughout as well as cues for improved pursed lip breathing to regulate HR and improve O@ sats.   Pt returned to room and performed stand pivot transfer to bed with RW and supervision assist. Sit>supine completed with use of bed rails and no assist from PT. Pt performd supine therex: SLR x 8 and hip abduction in sidelying x 8. Then left supine in bed with call bell in reach and all needs met.         Therapy Documentation Precautions:  Precautions Precautions: Fall,Other (comment) Precaution Comments: 3L O2  with mobility, sacral wound, trach Restrictions Weight Bearing Restrictions: No    Vital Signs: Therapy Vitals Temp: 98.3 F (36.8 C) Pulse Rate: 96 Resp: 17 BP: 113/88 Patient Position (if appropriate): Lying Oxygen Therapy SpO2: 98 % O2 Device: Room Air Pain: Pain Assessment Pain Scale: 0-10 Pain Score: 0-No pain   Therapy/Group: Individual Therapy  AusLorie Phenix/23/2021, 9:03 AM

## 2020-05-24 NOTE — Progress Notes (Signed)
[place] PHYSICAL MEDICINE & REHABILITATION PROGRESS NOTE   Subjective/Complaints: No pain c/os no abd pain , no issues in therapy, discussed d/c date   ROS- Pt denies SOB, abd pain, CP, N/V/C/D, and vision changes, + pain at pressure injury site.    Objective:   No results found. Recent Labs    05/22/20 1232  WBC 10.1  HGB 7.3*  HCT 22.8*  PLT 359   Recent Labs    05/22/20 1232  NA 132*  K 4.5  CL 93*  CO2 23  GLUCOSE 166*  BUN 60*  CREATININE 7.40*  CALCIUM 9.9    Intake/Output Summary (Last 24 hours) at 05/24/2020 1610 Last data filed at 05/23/2020 1456 Gross per 24 hour  Intake 120 ml  Output --  Net 120 ml        Physical Exam: Vital Signs Blood pressure 113/88, pulse 96, temperature 98.3 F (36.8 C), resp. rate 17, height 5\' 2"  (1.575 m), weight 103.2 kg, SpO2 98 %. HEENT: healed trach     General: No acute distress Mood and affect are appropriate Heart: Regular rate and rhythm no rubs murmurs or extra sounds Lungs: Clear to auscultation, breathing unlabored, no rales or wheezes Abdomen: Positive bowel sounds, soft nontender to palpation, nondistended Extremities: No clubbing, cyanosis, or edema Skin: No evidence of breakdown, no evidence of rash     Skin: Sacral decubitus     05/23/2020 Neurologic: Cranial nerves II through XII intact, motor strength is 4/5 in bilateral deltoid, bicep, tricep, grip, hip flexor, knee extensors, ankle dorsiflexor and plantar flexor Sensory exam able to sense LT but reports numbness RIght little finger and Left toes Cerebellar exam normal finger to nose to finger as well as heel to shin in bilateral upper and lower extremities Musculoskeletal: Full range of motion in all 4 extremities. No joint swelling    Assessment/Plan: 1. Functional deficits which require 3+ hours per day of interdisciplinary therapy in a comprehensive inpatient rehab setting.  Physiatrist is providing close team supervision  and 24 hour management of active medical problems listed below.  Physiatrist and rehab team continue to assess barriers to discharge/monitor patient progress toward functional and medical goals  Care Tool:  Bathing    Body parts bathed by patient: Face,Right lower leg,Left upper leg,Left arm,Right arm,Right upper leg,Chest,Front perineal area,Abdomen,Left lower leg   Body parts bathed by helper: Buttocks,Left lower leg     Bathing assist Assist Level: Minimal Assistance - Patient > 75%     Upper Body Dressing/Undressing Upper body dressing   What is the patient wearing?: Pull over shirt    Upper body assist Assist Level: Minimal Assistance - Patient > 75%    Lower Body Dressing/Undressing Lower body dressing      What is the patient wearing?: Pants     Lower body assist Assist for lower body dressing: Contact Guard/Touching assist     Toileting Toileting    Toileting assist Assist for toileting: Minimal Assistance - Patient > 75%     Transfers Chair/bed transfer  Transfers assist     Chair/bed transfer assist level: Contact Guard/Touching assist     Locomotion Ambulation   Ambulation assist      Assist level: Minimal Assistance - Patient > 75% Assistive device: Walker-rolling Max distance: 34ft   Walk 10 feet activity   Assist     Assist level: Minimal Assistance - Patient > 75% Assistive device: Walker-rolling   Walk 50 feet activity   Assist Walk 50  feet with 2 turns activity did not occur: Safety/medical concerns         Walk 150 feet activity   Assist Walk 150 feet activity did not occur: Safety/medical concerns         Walk 10 feet on uneven surface  activity   Assist Walk 10 feet on uneven surfaces activity did not occur: Safety/medical concerns         Wheelchair     Assist Will patient use wheelchair at discharge?: Yes Type of Wheelchair: Manual    Wheelchair assist level: Supervision/Verbal cueing Max  wheelchair distance: 195ft    Wheelchair 50 feet with 2 turns activity    Assist        Assist Level: Supervision/Verbal cueing   Wheelchair 150 feet activity     Assist      Assist Level: Moderate Assistance - Patient 50 - 74%   Blood pressure 113/88, pulse 96, temperature 98.3 F (36.8 C), resp. rate 17, height 5\' 2"  (1.575 m), weight 103.2 kg, SpO2 98 %.    Medical Problem List and Plan: 1.  Functional and mobility deficits secondary to debility after COVID and associated complications including VDRF/trach and large sacral wound Has neuropathy, mononeuropathy multiplex pattern, no sig neurogenic pain             -patient may not yet shower             -ELOS/Goals: 14-21 days, mod I PT, min assist OT,   Continue CIR .2.  Antithrombotics: -DVT/anticoagulation:  Pharmaceutical: Heparin             -antiplatelet therapy: N/A 3. Pain Management: Continue Oxycodone prn.  12/18- pt reports gets her pain meds for sacral decub after HD- con't regimen  4. Mood: LCSW to follow for evaluation and support.              -antipsychotic agents: N/A 5. Neuropsych: This patient is capable of making decisions on her own behalf--dicussed code status -->SHE WANTS TO BE FULL CODE . 6.Large stage IV sacral/buttock wound/Wound Care: Meropenum completed 05/13/20.     -Juven tid, glucerna tid and vitamin B w/C.               -Santyl to eschar on inferior flap and wet to dry dressing changes bid and prn to keep wound clean of stool to prevent contamination.              -Pressure relief measure with air mattress.                -WBC trending upwards--recheck in am   12/18- WBC down to 10.8 from 14k- will monitor 7. Fluids/Electrolytes/Nutrition: Renal diet with 1200 cc FR. Strict I/O with daily weights.              -check labs with HD 8.T2DM: On glucerna tid with meals. Monitor BS ac/hs--On Lantus 3 units daily with SSI for elevated BS.  CBG (last 3)  Recent Labs    05/23/20 1733  05/23/20 2106 05/24/20 0536  GLUCAP 179* 191* 137*  12/20: CBGs 150s-180s: continue current regimen  9. Hypotension: Monitor BP tid--especially with increase in activity. Continue Midodrine 10 mg tid.  Vitals:   05/23/20 2024 05/24/20 0539  BP: 132/82 113/88  Pulse: 97 96  Resp: 16 17  Temp: 98.7 F (37.1 C) 98.3 F (36.8 C)  SpO2: 98% 98%  Controlled on Midodrine 10mg  TID Hypthyroid: Stable on supplement.  10. ESRD:  HD -TTS  with nephrology to assist with management. Schedule HD at the end of the day to help with tolerance of activity. Sevelmar tid for metabolic bone disease. Hyperkalemia managed with HD.  Per Nephro 11. Anemia of critical illness/anemia of CKD: On Aranesp 150 mc/week. Hgb stable at 7.2 range past week.  Transfuse prn Hgb<7.0.    12/18- Hb up to 8.0- con't regimen 12.  Anxiety: Continue low dose klonopin in am and Seroquel at nights to help with sleep.  13. Leukocytosis.: Given SOB, ordered CXR 12/16 which was stable. WBC increased further to 14 on 12/17. UA/UC ordered and repeat CBC ordered for 12/18  12/18- WBC down to 10.8- con't to monitor for clinical Sx's of infection   LOS: 9 days A FACE TO FACE EVALUATION WAS PERFORMED  Charlett Blake 05/24/2020, 9:38 AM

## 2020-05-24 NOTE — Progress Notes (Signed)
Physical Therapy Weekly Progress Note  Patient Details  Name: Wanda Ramirez MRN: [id number] Date of Birth: 1971/02/15   Beginning of progress report period: May 16, 2020 End of progress report period: May 23, 2020  Today's Date: 05/23/2020   Patient has met 4 of 4 short term goals.  Pt has made excellent gains during her current course of therapy. Pt currently performs bed mobility at supervision level with at times CGA when fatigued. Performs transfers bed/w/c at close supervision. Pt can ambulate distances ~30-30f with supervision and RW with rollator being introduced. Stair training has been introduced but has been limited by cardiovascular endurance, although pt may change d/c plan to mother's home which will reduce number of stairs to enter.   Patient continues to demonstrate the following deficits muscle weakness and decreased cardiorespiratoy endurance and decreased oxygen support and therefore will continue to benefit from skilled PT intervention to increase functional independence with mobility.  Patient progressing toward long term goals..  Continue plan of care.  PT Short Term Goals Week 1:  PT Short Term Goal 1 (Week 1): pt will perform all transfers with CGA PT Short Term Goal 2 (Week 1): pt will perform bed mobility with CGA PT Short Term Goal 3 (Week 1): pt will ambulate ~340fwith CGA and LRAD PT Short Term Goal 4 (Week 1): pt will initiate stair training Week 2:  PT Short Term Goal 1 (Week 2): STG=LTG due to ELOS       Therapy Documentation Precautions:  Precautions Precautions: Fall,Other (comment) Precaution Comments: 3L O2 with mobility, sacral wound, trach Restrictions Weight Bearing Restrictions: No General: Vital Signs: Therapy Vitals Temp: 98.7 F (37.1 C) Temp Source: Oral Pulse Rate: 97 Resp: 16 BP: 115/73 Patient Position (if appropriate): Lying Oxygen Therapy SpO2: 96 % O2 Device: Room Air Pain: Pain Assessment Pain  Scale: 0-10 Pain Score: 0-No pain    Therapy/Group: Individual Therapy  AuLorie Phenix2/23/2021, 8:46 AM

## 2020-05-25 ENCOUNTER — Inpatient Hospital Stay (HOSPITAL_COMMUNITY): Payer: Medicaid Other | Admitting: Physical Therapy

## 2020-05-25 ENCOUNTER — Inpatient Hospital Stay (HOSPITAL_COMMUNITY): Payer: Medicaid Other

## 2020-05-25 LAB — GLUCOSE, CAPILLARY
Glucose-Capillary: 123 mg/dL — ABNORMAL HIGH (ref 70–99)
Glucose-Capillary: 154 mg/dL — ABNORMAL HIGH (ref 70–99)
Glucose-Capillary: 183 mg/dL — ABNORMAL HIGH (ref 70–99)
Glucose-Capillary: 173 mg/dL — ABNORMAL HIGH (ref 70–99)

## 2020-05-25 NOTE — Progress Notes (Signed)
Occupational Therapy Weekly Progress Note  Patient Details  Name: Wanda Ramirez MRN: [id number] Date of Birth: 1970-10-31  Beginning of progress report period: May 16, 2020 End of progress report period: May 25, 2020  Today's Date: 05/25/2020 OT Individual Time: 2423-5361 OT Individual Time Calculation (min): 69 min    Patient has met 2 of 3 short term goals.  Pt is progressing well with ADLS and functional mobilty requiring MIN A for ADL performance and functional transfers at this time. Pt continues to be limited by BLE weakness and poor activity tolerance/strength. Pt is motivated and would benefit from skilled OT to continue to progress to Supervision level goals  Patient continues to demonstrate the following deficits: muscle weakness, decreased cardiorespiratoy endurance and decreased oxygen support and decreased sitting balance, decreased standing balance, decreased postural control and decreased balance strategies and therefore will continue to benefit from skilled OT intervention to enhance overall performance with BADL and iADL.  Patient progressing toward long term goals..  Continue plan of care.  OT Short Term Goals Week 1:  OT Short Term Goal 1 (Week 1): Pt will don BLE into pants with min A. OT Short Term Goal 1 - Progress (Week 1): Met OT Short Term Goal 2 (Week 1): Pt will complete LB bathing with min A. OT Short Term Goal 2 - Progress (Week 1): Met OT Short Term Goal 3 (Week 1): Pt will complete sit<>stand in prep for standing ADLs with close supervision. OT Short Term Goal 3 - Progress (Week 1): Progressing toward goal Week 2:  OT Short Term Goal 1 (Week 2): STG=LTG d/t ELOS  Skilled Therapeutic Interventions/Progress Updates:    Pt received in bed with unrated pain in sacrum. Repositioning at end of session in sidelying for comfort. Pt on RA throughout session during sitting and standing pt O2> 95%. Pt HR elevates up to 128 with standing but requires <30  secs to decrease to <110.  ADL:  Pt completes grooming with set up seated at sink Pt completes UB dressing with set up Pt completes LB dressing with MIN A for power up into stand ing and MIN A to advance pants past back of buttocks  Therapeutic activity IN sitting in w/c at high low table and standing with S (2 trials up to 45 seconds) pt plays game of checkers for reaching in max ranges outside BOS and endurance. Pt requries prolonged rest after standing trials d/t poor activity tolerance. Pt completes dynamic reaching for clothespins in max ranges in all directions for dynamic sitting, funcitnoal reach, and [facility] for RUE.  Pt left at end of session in bed with exit alarm on, call light in reach and all needs met   Therapy Documentation Precautions:  Precautions Precautions: Fall,Other (comment) Precaution Comments: Sacral wound Restrictions Weight Bearing Restrictions: No General:   Vital Signs: Therapy Vitals Temp: 97.6 F (36.4 C) Temp Source: Oral Pulse Rate: (!) 107 Resp: 17 BP: 126/80 Patient Position (if appropriate): Lying Oxygen Therapy SpO2: 100 % Pain: Pain Assessment Pain Scale: 0-10 Pain Score: 8  Pain Type: Acute pain Pain Location: Buttocks Pain Intervention(s): Repositioned ADL: ADL Eating: Set up Where Assessed-Eating: Chair Grooming: Supervision/safety Where Assessed-Grooming: Edge of bed Upper Body Bathing: Minimal assistance Where Assessed-Upper Body Bathing: Edge of bed Lower Body Bathing: Moderate assistance Where Assessed-Lower Body Bathing: Edge of bed Upper Body Dressing: Supervision/safety Where Assessed-Upper Body Dressing: Edge of bed Lower Body Dressing: Moderate assistance Where Assessed-Lower Body Dressing: Edge of bed Toileting: Maximal assistance  Where Assessed-Toileting: Bedside Commode Toilet Transfer: Moderate assistance Toilet Transfer Method: Stand pivot Science writer: Extra wide Human resources officer: Not assessed Social research officer, government: Not assessed Vision   Perception    Praxis   Exercises:   Other Treatments:     Therapy/Group: Individual Therapy  Tonny Branch 05/25/2020, 8:58 AM

## 2020-05-25 NOTE — Progress Notes (Signed)
Physical Therapy Session Note  Patient Details  Name: Wanda Ramirez MRN: [id number] Date of Birth: 20-Jan-1971  Today's Date: 05/25/2020 PT Individual Time: 1000-1045 and 1426-1540 PT Individual Time Calculation (min): 45 min and 74 min  Short Term Goals: Week 2:  PT Short Term Goal 1 (Week 2): STG=LTG due to ELOS  Skilled Therapeutic Interventions/Progress Updates: Tx1: Pt presented in bed agreeable to therapy. Pt c/o some "soreness" at sacral wound but denies intervention, rest breaks provided as needed. Performed supine to sit with supervision and use of bed features. Performed ambulatory transfer to w/c with RW and supervision. Pt transported to day room and remaining session focused on ambulation for endurance and cardiovascular training. Pt ambulated 25f, 377f and 4016f 2 with seated rest break. Max HR 135 with ambulation (first bout of 31f33fith HR decreasing to low 100s with rest within 2 min. Pt on RA this session with SpO2 95% throughout session. Pt transported back to room at end of session and performed ambulatory transfer to recliner. Pt left resting in recliner with seat alarm on, call bell within reach and needs met.   Tx2: Pt presented in recliner agreeable to therapy. Pt states pain "comes and goes" at sacrum but did not request any intervention during session, rest breaks provided as needed. Pt performed ambulatory transfer to w/c with RW and supervision. Pt transported to rehab gym for energy conservation. Participated in stair training 1 rail. Pt ascended x 2 steps with R rail then x 3 steps with R rail. Pt cued to push through LLE vs pulling up with BUE. Max RW 133 after x 3 steps. Pt then transported to day room and performed ambulaotry transfer to NuStep. Participated in NuStep L1 x 6 min for cardiovascular conditioning. Pt able to maintain avg 40 SPM and HR 130 after activity with SpO2 87% but recovery to 91% within 5 sec. Pt then ambulated to mat and participated in x 2  bouts of corn hole with ~15 bags with seated rest between. First bout with pt reaching across midline with R hand to left. Then second bout pt participating in dynamic reaching with moderate challenges. Pt then participated in seated and standing therex as follows: LAQ x 15 bilaterally, standing hip abd/add x 15 bilaterally, heel raises (standing) x 15, standing hamstring curls x 15 bilaterally Pt then transported back to room and performed ambulatory transfer to bed. Pt doffed shirt mod I and required minA for doffing pants (as grip socks stuck to pants). Pt returned to supine mod I with use of bed rail and [facility]. Pt repositioned to comfort and left with call bell within reach and needs met.      Therapy Documentation Precautions:  Precautions Precautions: Fall,Other (comment) Precaution Comments: Sacral wound Restrictions Weight Bearing Restrictions: No   Therapy/Group: Individual Therapy  Jakaree Pickard  Dior Dominik, PTA  05/25/2020, 12:33 PM

## 2020-05-25 NOTE — Progress Notes (Signed)
[facility] PHYSICAL MEDICINE & REHABILITATION PROGRESS NOTE   Subjective/Complaints: No issues overnite, discussed pressure relief with pt   ROS- Pt denies SOB, abd pain, CP, N/V/C/D, and vision changes, + pain at pressure injury site.    Objective:   No results found. Recent Labs    05/22/20 1232 05/24/20 1332  WBC 10.1 13.6*  HGB 7.3* 7.1*  HCT 22.8* 22.3*  PLT 359 327   Recent Labs    05/22/20 1232 05/24/20 1332  NA 132* 130*  K 4.5 4.6  CL 93* 92*  CO2 23 22  GLUCOSE 166* 204*  BUN 60* 55*  CREATININE 7.40* 7.16*  CALCIUM 9.9 10.1    Intake/Output Summary (Last 24 hours) at 05/25/2020 0958 Last data filed at 05/25/2020 0700 Gross per 24 hour  Intake 620 ml  Output 1500 ml  Net -880 ml     Pressure Injury 04/03/20 Left Stage 4 - Full thickness tissue loss with exposed bone, tendon or muscle. Updated as of 05/03/20:Wound type: surgically debrided Stage 4 Pressure Injury  Measurement: 17cm x 15cm x 4cm with tunneling at 6 oclock that is 9 cm  (Active)  04/03/20   Location:   Location Orientation: Left  Staging: Stage 4 - Full thickness tissue loss with exposed bone, tendon or muscle.  Wound Description (Comments): Updated as of 05/03/20:Wound type: surgically debrided Stage 4 Pressure Injury  Measurement: 17cm x 15cm x 4cm with tunneling at 6 oclock that is 9 cm deep and tunneling at 2 oclock that is 8cm  Present on Admission: Yes    Physical Exam: Vital Signs Blood pressure 126/80, pulse (!) 107, temperature 97.6 F (36.4 C), temperature source Oral, resp. rate 17, height 5\' 2"  (1.575 m), weight 89.7 kg, SpO2 100 %. HEENT: healed trach   General: No acute distress Mood and affect are appropriate Heart: Regular rate and rhythm no rubs murmurs or extra sounds Lungs: Clear to auscultation, breathing unlabored, no rales or wheezes Abdomen: Positive bowel sounds, soft nontender to palpation, nondistended Extremities: No clubbing, cyanosis, or edema Skin:  No evidence of breakdown, no evidence of rash    Skin: Sacral decubitus     05/23/2020 Neurologic: Cranial nerves II through XII intact, motor strength is 4/5 in bilateral deltoid, bicep, tricep, grip, hip flexor, knee extensors, ankle dorsiflexor and plantar flexor Sensory exam able to sense LT but reports numbness RIght little finger and Left toes Cerebellar exam normal finger to nose to finger as well as heel to shin in bilateral upper and lower extremities Musculoskeletal: Full range of motion in all 4 extremities. No joint swelling    Assessment/Plan: 1. Functional deficits which require 3+ hours per day of interdisciplinary therapy in a comprehensive inpatient rehab setting.  Physiatrist is providing close team supervision and 24 hour management of active medical problems listed below.  Physiatrist and rehab team continue to assess barriers to discharge/monitor patient progress toward functional and medical goals  Care Tool:  Bathing    Body parts bathed by patient: Face,Left upper leg,Left arm,Right arm,Chest,Front perineal area,Abdomen,Right upper leg   Body parts bathed by helper: Left lower leg,Right lower leg Body parts n/a: Buttocks   Bathing assist Assist Level: Minimal Assistance - Patient > 75%     Upper Body Dressing/Undressing Upper body dressing   What is the patient wearing?: Hospital gown only    Upper body assist Assist Level: Set up assist    Lower Body Dressing/Undressing Lower body dressing      What  is the patient wearing?: Pants     Lower body assist Assist for lower body dressing: Contact Guard/Touching assist     Toileting Toileting    Toileting assist Assist for toileting: Minimal Assistance - Patient > 75%     Transfers Chair/bed transfer  Transfers assist     Chair/bed transfer assist level: Minimal Assistance - Patient > 75% Chair/bed transfer assistive device: Programmer, multimedia   Ambulation assist       Assist level: Contact Guard/Touching assist Assistive device: Rollator Max distance: 10'   Walk 10 feet activity   Assist     Assist level: Contact Guard/Touching assist Assistive device: Walker-rolling   Walk 50 feet activity   Assist Walk 50 feet with 2 turns activity did not occur: Safety/medical concerns         Walk 150 feet activity   Assist Walk 150 feet activity did not occur: Safety/medical concerns         Walk 10 feet on uneven surface  activity   Assist Walk 10 feet on uneven surfaces activity did not occur: Safety/medical concerns         Wheelchair     Assist Will patient use wheelchair at discharge?: Yes Type of Wheelchair: Manual    Wheelchair assist level: Supervision/Verbal cueing Max wheelchair distance: 111ft    Wheelchair 50 feet with 2 turns activity    Assist        Assist Level: Supervision/Verbal cueing   Wheelchair 150 feet activity     Assist      Assist Level: Moderate Assistance - Patient 50 - 74%   Blood pressure 126/80, pulse (!) 107, temperature 97.6 F (36.4 C), temperature source Oral, resp. rate 17, height 5\' 2"  (1.575 m), weight 89.7 kg, SpO2 100 %.    Medical Problem List and Plan: 1.  Functional and mobility deficits secondary to debility after COVID and associated complications including VDRF/trach and large sacral wound Has neuropathy, mononeuropathy multiplex pattern, no sig neurogenic pain             -patient may not yet shower             -ELOS/Goals: 12/29  Continue CIR .2.  Antithrombotics: -DVT/anticoagulation:  Pharmaceutical: Heparin             -antiplatelet therapy: N/A 3. Pain Management: Continue Oxycodone prn.  12/18- pt reports gets her pain meds for sacral decub after HD- con't regimen  4. Mood: LCSW to follow for evaluation and support.              -antipsychotic agents: N/A 5. Neuropsych: This patient is capable of making decisions on her own behalf--dicussed  code status -->SHE WANTS TO BE FULL CODE . 6.Large stage IV sacral/buttock wound/Wound Care: Meropenum completed 05/13/20.     -Juven tid, glucerna tid and vitamin B w/C.               -Santyl to eschar on inferior flap and wet to dry dressing changes bid and prn to keep wound clean of stool to prevent contamination.              -Pressure relief measure with air mattress.                -WBC trending upwards--recheck in am   12/18- WBC down to 10.8 from 14k- will monitor 7. Fluids/Electrolytes/Nutrition: Renal diet with 1200 cc FR. Strict I/O with daily weights.              -  check labs with HD 8.T2DM: On glucerna tid with meals. Monitor BS ac/hs--On Lantus 3 units daily with SSI for elevated BS.  CBG (last 3)  Recent Labs    05/24/20 1828 05/24/20 2027 05/25/20 0605  GLUCAP 157* 190* 123*  controlled 12/24  9. Hypotension: Monitor BP tid--especially with increase in activity. Continue Midodrine 10 mg tid.  Vitals:   05/24/20 2133 05/25/20 0606  BP:  126/80  Pulse: (!) 108 (!) 107  Resp: 18 17  Temp:  97.6 F (36.4 C)  SpO2: 99% 100%  Controlled on Midodrine 10mg  TID Hypthyroid: Stable on supplement.  10. ESRD:  HD -TTS with nephrology to assist with management. Schedule HD at the end of the day to help with tolerance of activity. Sevelmar tid for metabolic bone disease. Hyperkalemia managed with HD.  Per Nephro 11. Anemia of critical illness/anemia of CKD: On Aranesp 150 mc/week. Hgb stable at 7.2 range past week.  Transfuse prn Hgb<7.0.    12/18- Hb up to 8.0- con't regimen 12.  Anxiety: Continue low dose klonopin in am and Seroquel at nights to help with sleep.  13. Leukocytosis.: afebrile recent UA C and S neg ,sacral wound clean  no resp sx, monitor    LOS: 10 days A FACE TO FACE EVALUATION WAS PERFORMED  Charlett Blake 05/25/2020, 9:58 AM

## 2020-05-26 LAB — GLUCOSE, CAPILLARY
Glucose-Capillary: 120 mg/dL — ABNORMAL HIGH (ref 70–99)
Glucose-Capillary: 177 mg/dL — ABNORMAL HIGH (ref 70–99)
Glucose-Capillary: 180 mg/dL — ABNORMAL HIGH (ref 70–99)
Glucose-Capillary: 232 mg/dL — ABNORMAL HIGH (ref 70–99)

## 2020-05-26 LAB — PREPARE RBC (CROSSMATCH)

## 2020-05-26 MED ORDER — SODIUM CHLORIDE 0.9% IV SOLUTION: Freq: Once | INTRAVENOUS | Status: DC

## 2020-05-26 NOTE — Progress Notes (Signed)
Patient ID: Wanda Ramirez, female   DOB: 1970-09-21, 49 y.o.   MRN: [id number]  [facility] Progress Note   Assessment/ Plan:   1.  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  Her neuropathy pain the pain somewhat limiting and challenging to progress. 2. ESRD: Usually on a Tuesday/Thursday/Saturday dialysis schedule and underwent hemodialysis on 12/23 with next dialysis scheduled for tomorrow.  She does not appear to be hypervolemic and states that her last dialysis treatment was complicated by intradialytic hypotension. 3. Anemia: Secondary to end-stage renal disease and complicated by intermittent losses from sacral wound as well as the inflammatory complex thereof.  Will prescribe 1 unit PRBC with dialysis tomorrow. 4. CKD-MBD: Calcium level elevated (when corrected for low albumin) with phosphorus level at goal.  Continue sevelamer for phosphorus binding. 5. Nutrition: Continue current renal diet with fluid restriction and ongoing oral nutritional supplementation. 6. Hypotension: Chronic, remains on midodrine.  Limiting ultrafiltration with dialysis. 7.  Stage IV sacral decubitus ulcer: Ongoing aggressive wound care but has completed meropenem and is undergoing debridement.  Subjective:   Denies any acute events overnight and is excited that she will not have dialysis today because her mother is visiting.   Objective:   BP 116/76 (BP Location: Right Arm)   Pulse 95   Temp 98 F (36.7 C)   Resp 17   Ht $R'5\' 2"'YE$  (1.575 m)   Wt 96.6 kg   SpO2 97%   BMI 38.96 kg/m   Physical Exam: Gen: In pleasant disposition, resting comfortably in bed CVS: Pulse regular rhythm, normal rate, S1 and S2 normal Resp: Clear to auscultation bilaterally, no distinct rales or rhonchi.  Left IJ TDC in place. Abd: Soft, obese,  nontender Ext: No lower extremity edema.  Thrombosed left brachiocephalic fistula.  Labs: BMET Recent Labs  Lab 05/19/20 1547 05/22/20 1232 05/24/20 1332  NA 132* 132* 130*  K 5.4* 4.5 4.6  CL 91* 93* 92*  CO2 $Re'24 23 22  'JqN$ GLUCOSE 214* 166* 204*  BUN 63* 60* 55*  CREATININE 7.17* 7.40* 7.16*  CALCIUM 10.3 9.9 10.1  PHOS 5.4* 3.5 3.8   CBC Recent Labs  Lab 05/22/20 1232 05/24/20 1332  WBC 10.1 13.6*  HGB 7.3* 7.1*  HCT 22.8* 22.3*  MCV 86.0 86.8  PLT 359 327      Medications:    . B-complex with vitamin C  1 tablet Oral Daily  . brimonidine  1 drop Both Eyes TID  . brinzolamide  1 drop Both Eyes TID  . chlorhexidine gluconate (MEDLINE KIT)  15 mL Mouth Rinse BID  . Chlorhexidine Gluconate Cloth  6 each Topical Daily  . Chlorhexidine Gluconate Cloth  6 each Topical Q0600  . clonazepam  0.25 mg Oral Daily  . collagenase   Topical Daily  . darbepoetin (ARANESP) injection - DIALYSIS  150 mcg Intravenous Q Thu-HD  . feeding supplement (GLUCERNA SHAKE)  237 mL Oral TID WC  . heparin  5,000 Units Subcutaneous Q8H  . hydrocerin   Topical Daily  . insulin aspart  0-6 Units Subcutaneous TID AC & HS  . insulin glargine  4 Units Subcutaneous QHS  . latanoprost  1 drop Both Eyes q AM  . levothyroxine  125 mcg Oral Q0600  . midodrine  10 mg Oral TID WC  . multivitamin  1 tablet Oral QHS  .  nutrition supplement (JUVEN)  1 packet Oral BID BM  . pantoprazole  40 mg Oral Daily  . QUEtiapine  50 mg Oral QHS  . sevelamer carbonate  1,600 mg Oral TID WC   Elmarie Shiley, MD 05/26/2020, 11:15 AM

## 2020-05-26 NOTE — Progress Notes (Signed)
Dr. Bonney Aid notified of patient not having dialysis today due to holiday, he states ok to delay transfusion of blood until dialysis on Sunday, 05/27/20

## 2020-05-27 ENCOUNTER — Inpatient Hospital Stay (HOSPITAL_COMMUNITY): Payer: Medicaid Other | Admitting: Physical Therapy

## 2020-05-27 ENCOUNTER — Inpatient Hospital Stay (HOSPITAL_COMMUNITY): Payer: Medicaid Other | Admitting: Occupational Therapy

## 2020-05-27 LAB — RENAL FUNCTION PANEL
Albumin: 2.5 g/dL — ABNORMAL LOW (ref 3.5–5.0)
Anion gap: 14 (ref 5–15)
BUN: 62 mg/dL — ABNORMAL HIGH (ref 6–20)
CO2: 25 mmol/L (ref 22–32)
Calcium: 10.2 mg/dL (ref 8.9–10.3)
Chloride: 95 mmol/L — ABNORMAL LOW (ref 98–111)
Creatinine, Ser: 8.08 mg/dL — ABNORMAL HIGH (ref 0.44–1.00)
GFR, Estimated: 6 mL/min — ABNORMAL LOW (ref >60)
Glucose, Bld: 191 mg/dL — ABNORMAL HIGH (ref 70–99)
Phosphorus: 3 mg/dL (ref 2.5–4.6)
Potassium: 4.6 mmol/L (ref 3.5–5.1)
Sodium: 134 mmol/L — ABNORMAL LOW (ref 135–145)

## 2020-05-27 LAB — CBC
HCT: 22.3 % — ABNORMAL LOW (ref 36.0–46.0)
Hemoglobin: 7 g/dL — ABNORMAL LOW (ref 12.0–15.0)
MCH: 27.8 pg (ref 26.0–34.0)
MCHC: 31.4 g/dL (ref 30.0–36.0)
MCV: 88.5 fL (ref 80.0–100.0)
Platelets: 366 10*3/uL (ref 150–400)
RBC: 2.52 MIL/uL — ABNORMAL LOW (ref 3.87–5.11)
RDW: 15.2 % (ref 11.5–15.5)
WBC: 10.3 10*3/uL (ref 4.0–10.5)
nRBC: 0 % (ref 0.0–0.2)

## 2020-05-27 LAB — GLUCOSE, CAPILLARY
Glucose-Capillary: 125 mg/dL — ABNORMAL HIGH (ref 70–99)
Glucose-Capillary: 194 mg/dL — ABNORMAL HIGH (ref 70–99)
Glucose-Capillary: 190 mg/dL — ABNORMAL HIGH (ref 70–99)

## 2020-05-27 MED ORDER — ACETAMINOPHEN 325 MG PO TABS
ORAL_TABLET | ORAL | Status: AC
  Filled 2020-05-27: qty 2

## 2020-05-27 NOTE — Plan of Care (Signed)
OT POC readjusted, please see POC for details  Problem: RH Toileting Goal: LTG Patient will perform toileting task (3/3 steps) with assistance level (OT) Description: LTG: Patient will perform toileting task (3/3 steps) with assistance level (OT)  Flowsheets (Taken 05/27/2020 1156) LTG: Pt will perform toileting task (3/3 steps) with assistance level: Minimal Assistance - Patient > 75% Note: Goal downgraded due to bulky dressings from wounds on buttocks and pt needing assistance for pericare for wound safety   Problem: RH Simple Meal Prep Goal: LTG Patient will perform simple meal prep w/assist (OT) Description: LTG: Patient will perform simple meal prep with assistance, with/without cues (OT). Outcome: Not Applicable Note: Goal d/c due to pt having IADL assist from mother at time of d/c   Problem: RH Light Housekeeping Goal: LTG Patient will perform light housekeeping w/assist (OT) Description: LTG: Patient will perform light housekeeping with assistance, with/without cues (OT). Outcome: Not Applicable Note: Goal d/c due to pt having IADL assist from mother at time of d/c

## 2020-05-27 NOTE — Plan of Care (Signed)
  Problem: RH Stairs Goal: LTG Patient will ambulate up and down stairs w/assist (PT) Description: LTG: Patient will ambulate up and down # of stairs with assistance (PT) Flowsheets Taken 05/27/2020 0759 by Lorie Phenix, PT LTG: Pt will  ambulate up and down number of stairs: 2 STE with 1 rail Taken 05/16/2020 1251 by Gaylord Shih, Student-PT LTG: Pt will ambulate up/down stairs assist needed:: Minimal Assistance - Patient > 75%

## 2020-05-27 NOTE — Progress Notes (Signed)
Physical Therapy Session Note  Patient Details  Name: Wanda Ramirez MRN: [id number] Date of Birth: 1971-01-11  Today's Date: 05/27/2020 PT Individual Time: 1005-1100 PT Individual Time Calculation (min): 55 min   Short Term Goals: Week 1:  PT Short Term Goal 1 (Week 1): pt will perform all transfers with CGA PT Short Term Goal 2 (Week 1): pt will perform bed mobility with CGA PT Short Term Goal 3 (Week 1): pt will ambulate ~27f with CGA and LRAD PT Short Term Goal 4 (Week 1): pt will initiate stair training Week 2:  PT Short Term Goal 1 (Week 2): STG=LTG due to ELOS  Skilled Therapeutic Interventions/Progress Updates:   Pt received supine in bed and agreeable to PT. Supine>sit transfer without assist from PT. Pt performed upper and lower body dressing EOB with set up assist only. Facial hygiene performed EOB without assist from PT. [facility] and [facility] supervision assist from PT for safety, no LOB noted. Pt transported to entrance of hospital. Gait training on unlevel cement sidewall  X 460fand 93 ft with supervision assist from PT. Pt noted to have increased breath and heart rate following 2nd bout of gait training but no SOB for dizziness noted. Pt transfer to day room. Dynamic standing balance/tolerance while engaged in wii fit balance board, table tilt. Pt able to tolerate standing ~2.5 min with supervision assist and BUE support on RW. Min assist for improved COM control to utilize ankle strategy to perform adequate weight shift. One near LOB posteriorly. But able to correct with min assist. [facility] returned to [facility] and [facility] in reach and all needs met.         Therapy Documentation Precautions:  Precautions Precautions: Fall,Other (comment) Precaution Comments: Sacral wound Restrictions Weight Bearing Restrictions: No   Pain: Pain Assessment Pain Scale: 0-10 Pain Score: 7  Pain Type: Acute pain Pain Location:  Buttocks Pain Orientation: Left Pain Intervention(s): Repositioned;Medication (See eMAR)    Therapy/Group: Individual Therapy  AuLorie Phenix2/26/2021, 11:04 AM

## 2020-05-27 NOTE — Progress Notes (Signed)
Dressing change done for stage 4 buttocks wound. Some purulent drainage noted when changing dressing, and guaze. Otherwise wound was pink and moist.   Dayna Ramus

## 2020-05-27 NOTE — Progress Notes (Signed)
Patient ID: Wanda Ramirez, female   DOB: Jun 25, 1970, 49 y.o.   MRN: [id number]  [facility] Progress Note   Assessment/ Plan:   1.  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 Ongoing management of neuropathic pain. 2. ESRD: Usually on a Tuesday/Thursday/Saturday dialysis schedule and underwent hemodialysis on 12/23 with next dialysis scheduled for later today (adjusted per holiday schedule).  She appears to be close to euvolemic on exam and we will undertake cautious ultrafiltration with reevaluation of EDW. 3. Anemia: Secondary to end-stage renal disease and complicated by intermittent losses from sacral wound as well as inflammatory complex.  She will get 1 unit of PRBCs with dialysis today. 4. CKD-MBD: Calcium level elevated (when corrected for low albumin) with phosphorus level at goal.  Continue sevelamer for phosphorus binding. 5. Nutrition: Continue current renal diet with fluid restriction and ongoing oral nutritional supplementation. 6. Hypotension: Chronic, remains on midodrine.  Monitor with hemodialysis. 7.  Stage IV sacral decubitus ulcer: Ongoing aggressive wound care status post debridement/completion of meropenem; wound VAC reapplication to be addressed by wound care.  Subjective:   She reports a good day yesterday with mother visiting her in the afternoon.  Inquires about why her wound VAC was taken off.    Objective:   BP 118/79 (BP Location: Right Arm)   Pulse (!) 101   Temp 98.6 F (37 C) (Oral)   Resp 16   Ht '5\' 2"'  (1.575 m)   Wt 97.5 kg   SpO2 97%   BMI 39.32 kg/m   Physical Exam: Gen: In pleasant disposition, sitting up comfortably on the side of her bed CVS: Pulse regular rhythm, normal rate, S1 and S2 normal Resp: Clear to auscultation bilaterally, no distinct rales or  rhonchi.  Left IJ TDC in place. Abd: Soft, obese, nontender Ext: No lower extremity edema.  Thrombosed left brachiocephalic fistula.  Labs: BMET Recent Labs  Lab 05/22/20 1232 05/24/20 1332  NA 132* 130*  K 4.5 4.6  CL 93* 92*  CO2 23 22  GLUCOSE 166* 204*  BUN 60* 55*  CREATININE 7.40* 7.16*  CALCIUM 9.9 10.1  PHOS 3.5 3.8   CBC Recent Labs  Lab 05/22/20 1232 05/24/20 1332  WBC 10.1 13.6*  HGB 7.3* 7.1*  HCT 22.8* 22.3*  MCV 86.0 86.8  PLT 359 327      Medications:    . sodium chloride   Intravenous Once  . B-complex with vitamin C  1 tablet Oral Daily  . brimonidine  1 drop Both Eyes TID  . brinzolamide  1 drop Both Eyes TID  . chlorhexidine gluconate (MEDLINE KIT)  15 mL Mouth Rinse BID  . Chlorhexidine Gluconate Cloth  6 each Topical Daily  . Chlorhexidine Gluconate Cloth  6 each Topical Q0600  . clonazepam  0.25 mg Oral Daily  . collagenase   Topical Daily  . darbepoetin (ARANESP) injection - DIALYSIS  150 mcg Intravenous Q Thu-HD  . feeding supplement (GLUCERNA SHAKE)  237 mL Oral TID WC  . heparin  5,000 Units Subcutaneous Q8H  . hydrocerin   Topical Daily  . insulin aspart  0-6 Units Subcutaneous TID AC & HS  . insulin glargine  4 Units Subcutaneous QHS  . latanoprost  1 drop Both Eyes q AM  . levothyroxine  125 mcg Oral Q0600  . midodrine  10 mg  Oral TID WC  . multivitamin  1 tablet Oral QHS  . nutrition supplement (JUVEN)  1 packet Oral BID BM  . pantoprazole  40 mg Oral Daily  . QUEtiapine  50 mg Oral QHS  . sevelamer carbonate  1,600 mg Oral TID WC   Elmarie Shiley, MD 05/27/2020, 8:47 AM

## 2020-05-27 NOTE — Progress Notes (Signed)
[place] PHYSICAL MEDICINE & REHABILITATION PROGRESS NOTE   Subjective/Complaints: Discussed that patient no longer needs wound VAC. ROS- Pt denies SOB, abd pain, CP, N/V/C/D, and vision changes, + pain at pressure injury site.    Objective:   No results found. Recent Labs    05/24/20 1332  WBC 13.6*  HGB 7.1*  HCT 22.3*  PLT 327   Recent Labs    05/24/20 1332  NA 130*  K 4.6  CL 92*  CO2 22  GLUCOSE 204*  BUN 55*  CREATININE 7.16*  CALCIUM 10.1    Intake/Output Summary (Last 24 hours) at 05/27/2020 1032 Last data filed at 05/27/2020 0843 Gross per 24 hour  Intake 560 ml  Output --  Net 560 ml     Pressure Injury 04/03/20 Left Stage 4 - Full thickness tissue loss with exposed bone, tendon or muscle. Updated as of 05/03/20:Wound type: surgically debrided Stage 4 Pressure Injury  Measurement: 17cm x 15cm x 4cm with tunneling at 6 oclock that is 9 cm  (Active)  04/03/20   Location:   Location Orientation: Left  Staging: Stage 4 - Full thickness tissue loss with exposed bone, tendon or muscle.  Wound Description (Comments): Updated as of 05/03/20:Wound type: surgically debrided Stage 4 Pressure Injury  Measurement: 17cm x 15cm x 4cm with tunneling at 6 oclock that is 9 cm deep and tunneling at 2 oclock that is 8cm  Present on Admission: Yes    Physical Exam: Vital Signs Blood pressure 118/79, pulse (!) 101, temperature 98.6 F (37 C), temperature source Oral, resp. rate 16, height 5\' 2"  (1.575 m), weight 97.5 kg, SpO2 97 %. HEENT: healed trach    General: No acute distress Mood and affect are appropriate Heart: Regular rate and rhythm no rubs murmurs or extra sounds Lungs: Clear to auscultation, breathing unlabored, no rales or wheezes Abdomen: Positive bowel sounds, soft nontender to palpation, nondistended Extremities: No clubbing, cyanosis, or edema Skin: No evidence of breakdown, no evidence of rash      Skin: Sacral  decubitus     05/23/2020 Neurologic: Cranial nerves II through XII intact, motor strength is 4/5 in bilateral deltoid, bicep, tricep, grip, hip flexor, knee extensors, ankle dorsiflexor and plantar flexor Sensory exam able to sense LT but reports numbness RIght little finger and Left toes Cerebellar exam normal finger to nose to finger as well as heel to shin in bilateral upper and lower extremities Musculoskeletal: Full range of motion in all 4 extremities. No joint swelling    Assessment/Plan: 1. Functional deficits which require 3+ hours per day of interdisciplinary therapy in a comprehensive inpatient rehab setting.  Physiatrist is providing close team supervision and 24 hour management of active medical problems listed below.  Physiatrist and rehab team continue to assess barriers to discharge/monitor patient progress toward functional and medical goals  Care Tool:  Bathing    Body parts bathed by patient: Face,Left upper leg,Left arm,Right arm,Chest,Front perineal area,Abdomen,Right upper leg   Body parts bathed by helper: Left lower leg,Right lower leg Body parts n/a: Buttocks   Bathing assist Assist Level: Minimal Assistance - Patient > 75%     Upper Body Dressing/Undressing Upper body dressing   What is the patient wearing?: Hospital gown only    Upper body assist Assist Level: Set up assist    Lower Body Dressing/Undressing Lower body dressing      What is the patient wearing?: Pants     Lower body assist Assist for lower body  dressing: Contact Guard/Touching assist     Toileting Toileting    Toileting assist Assist for toileting: Minimal Assistance - Patient > 75%     Transfers Chair/bed transfer  Transfers assist     Chair/bed transfer assist level: Minimal Assistance - Patient > 75% Chair/bed transfer assistive device: Museum/gallery exhibitions officer assist      Assist level: Contact Guard/Touching assist Assistive  device: Rollator Max distance: 10'   Walk 10 feet activity   Assist     Assist level: Contact Guard/Touching assist Assistive device: Walker-rolling   Walk 50 feet activity   Assist Walk 50 feet with 2 turns activity did not occur: Safety/medical concerns         Walk 150 feet activity   Assist Walk 150 feet activity did not occur: Safety/medical concerns         Walk 10 feet on uneven surface  activity   Assist Walk 10 feet on uneven surfaces activity did not occur: Safety/medical concerns         Wheelchair     Assist Will patient use wheelchair at discharge?: Yes Type of Wheelchair: Manual    Wheelchair assist level: Supervision/Verbal cueing Max wheelchair distance: 171ft    Wheelchair 50 feet with 2 turns activity    Assist        Assist Level: Supervision/Verbal cueing   Wheelchair 150 feet activity     Assist      Assist Level: Moderate Assistance - Patient 50 - 74%   Blood pressure 118/79, pulse (!) 101, temperature 98.6 F (37 C), temperature source Oral, resp. rate 16, height 5\' 2"  (1.575 m), weight 97.5 kg, SpO2 97 %.    Medical Problem List and Plan: 1.  Functional and mobility deficits secondary to debility after COVID and associated complications including VDRF/trach and large sacral wound Has neuropathy, mononeuropathy multiplex pattern, no sig neurogenic pain             -patient may not yet shower             -ELOS/Goals: 12/29  Continue CIR .2.  Antithrombotics: -DVT/anticoagulation:  Pharmaceutical: Heparin             -antiplatelet therapy: N/A 3. Pain Management: Continue Oxycodone prn.  12/18- pt reports gets her pain meds for sacral decub after HD- con't regimen  4. Mood: LCSW to follow for evaluation and support.              -antipsychotic agents: N/A 5. Neuropsych: This patient is capable of making decisions on her own behalf--dicussed code status -->SHE WANTS TO BE FULL CODE . 6.Large stage IV  sacral/buttock wound/Wound Care: Meropenum completed 05/13/20.     -Juven tid, glucerna tid and vitamin B w/C.               -Santyl to eschar on inferior flap and wet to dry dressing changes bid and prn to keep wound clean of stool to prevent contamination.              -Pressure relief measure with air mattress.                -WBC trending upwards--recheck in am   12/18- WBC down to 10.8 from 14k- will monitor 7. Fluids/Electrolytes/Nutrition: Renal diet with 1200 cc FR. Strict I/O with daily weights.              -check labs with HD 8.T2DM: On glucerna tid with meals.  Monitor BS ac/hs--On Lantus 3 units daily with SSI for elevated BS.  CBG (last 3)  Recent Labs    05/26/20 1656 05/26/20 2107 05/27/20 0603  GLUCAP 180* 232* 125*  Some lability we will continue to monitor.  9. Hypotension: Monitor BP tid--especially with increase in activity. Continue Midodrine 10 mg tid.  Vitals:   05/26/20 1959 05/27/20 0602  BP: 130/85 118/79  Pulse: 100 (!) 101  Resp: 17 16  Temp: 98.2 F (36.8 C) 98.6 F (37 C)  SpO2: 100% 97%  Controlled on Midodrine 10mg  TID, 05/27/2020 Hypthyroid: Stable on supplement.  10. ESRD:  HD -TTS with nephrology to assist with management. Schedule HD at the end of the day to help with tolerance of activity. Sevelmar tid for metabolic bone disease. Hyperkalemia managed with HD.  Per Nephro 11. Anemia of critical illness/anemia of CKD: On Aranesp 150 mc/week. Hgb stable at 7.2 range past week.  Transfuse prn Hgb<7.0.    12/18- Hb up to 8.0- con't regimen 12.  Anxiety: Continue low dose klonopin in am and Seroquel at nights to help with sleep.  13. Leukocytosis.: afebrile recent UA C and S neg ,sacral wound clean  no resp sx, monitor    LOS: 12 days A FACE TO FACE EVALUATION WAS PERFORMED  Charlett Blake 05/27/2020, 10:32 AM

## 2020-05-27 NOTE — Progress Notes (Signed)
Occupational Therapy Session Note  Patient Details  Name: Wanda Ramirez MRN: [id number] Date of Birth: 1970-11-06  Today's Date: 05/27/2020 OT Individual Time: 1105-1200 OT Individual Time Calculation (min): 55 min   Short Term Goals: Week 2:  OT Short Term Goal 1 (Week 2): STG=LTG d/t ELOS  Skilled Therapeutic Interventions/Progress Updates:    Pt greeted in the w/c, premedicated for pain and requesting to start session by using the restroom. Close supervision-CGA for ambulatory transfer to the toilet using RW (the device she will use walking at home). CGA while she lowered clothing where pt had continent void of bowels. She needed assistance for hygiene completion due to wounds/partly exposed wound on buttocks. RN notified and redressed her bandages right after toileting. Transitioned to bathing/dressing tasks sit<stand at the sink. Close supervision for dynamic standing balance. She only needed assistance for reaching the Lt foot during bathing, used the sock aide to don socks during dressing tasks. OT also set up the brief as underwear and she was able to thread both LEs into brief/pants without assistance and pull them up. Discussed importance of having her mother present for safety during ambulatory transfers due to pts limited activity tolerance/standing endurance and frequent need for seated rest breaks. She reports her mother cannot attend family ed prior to d/c and therefore she will need to be called during a future OT session (grad day is Tuesday 12/28). Oral care completed at independent level. At end of session pt remained sitting up in the w/c for lunch, all needs within reach.   Therapy Documentation Precautions:  Precautions Precautions: Fall,Other (comment) Precaution Comments: Sacral wound Restrictions Weight Bearing Restrictions: No Vital Signs: Therapy Vitals Temp: 98.7 F (37.1 C) Temp Source: Oral Pulse Rate: 92 Resp: 19 BP: (!) 123/91 Oxygen Therapy SpO2: 98  % ADL: ADL Eating: Set up Where Assessed-Eating: Chair Grooming: Supervision/safety Where Assessed-Grooming: Edge of bed Upper Body Bathing: Minimal assistance Where Assessed-Upper Body Bathing: Edge of bed Lower Body Bathing: Moderate assistance Where Assessed-Lower Body Bathing: Edge of bed Upper Body Dressing: Supervision/safety Where Assessed-Upper Body Dressing: Edge of bed Lower Body Dressing: Moderate assistance Where Assessed-Lower Body Dressing: Edge of bed Toileting: Maximal assistance Where Assessed-Toileting: Bedside Commode Toilet Transfer: Moderate assistance Toilet Transfer Method: Stand pivot Toilet Transfer Equipment: Extra wide Engineer, technical sales Transfer: Not assessed Social research officer, government: Not assessed      Therapy/Group: Individual Therapy  Brookelynn Hamor A Kaled Allende 05/27/2020, 2:13 PM

## 2020-05-28 ENCOUNTER — Inpatient Hospital Stay (HOSPITAL_COMMUNITY): Payer: Medicaid Other | Admitting: Physical Therapy

## 2020-05-28 ENCOUNTER — Inpatient Hospital Stay (HOSPITAL_COMMUNITY): Payer: Medicaid Other

## 2020-05-28 ENCOUNTER — Inpatient Hospital Stay (HOSPITAL_COMMUNITY): Payer: Medicaid Other | Admitting: Occupational Therapy

## 2020-05-28 ENCOUNTER — Encounter (HOSPITAL_COMMUNITY): Payer: Medicaid Other | Admitting: Psychology

## 2020-05-28 LAB — TYPE AND SCREEN
ABO/RH(D): O POS
Antibody Screen: NEGATIVE
Unit division: 0

## 2020-05-28 LAB — GLUCOSE, CAPILLARY
Glucose-Capillary: 126 mg/dL — ABNORMAL HIGH (ref 70–99)
Glucose-Capillary: 181 mg/dL — ABNORMAL HIGH (ref 70–99)
Glucose-Capillary: 170 mg/dL — ABNORMAL HIGH (ref 70–99)
Glucose-Capillary: 173 mg/dL — ABNORMAL HIGH (ref 70–99)

## 2020-05-28 LAB — BPAM RBC
Blood Product Expiration Date: 202201272359
ISSUE DATE / TIME: 202112261651
Unit Type and Rh: 5100

## 2020-05-28 NOTE — Progress Notes (Addendum)
Patient ID: Wanda Ramirez, female   DOB: [birth year], 49 y.o.   MRN: [id number]  Per therapist sticky, DME ordered through [employer]:  BSC (does not meet weight requirements for Bariatric)   [name], [place]

## 2020-05-28 NOTE — Progress Notes (Signed)
Patient ID: Wanda Ramirez, female   DOB: 10-03-1970, 49 y.o.   MRN: [id number]   Patient declined by [facility]

## 2020-05-28 NOTE — Progress Notes (Signed)
Patient ID: Wanda Ramirez, female   DOB: [birth year], 49 y.o.   MRN: [id number]  [facility] Progress Note   Assessment/ Plan:   1.  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 Ongoing management of neuropathic pain. 2. ESRD: cont TTS schedule.  She appears to be close to euvolemic on exam and we will undertake cautious ultrafiltration with reevaluation of EDW.  3. Anemia: Secondary to end-stage renal disease and complicated by intermittent losses from sacral wound as well as inflammatory complex.  She got 1 unit of PRBCs with HD 12/26.  4. CKD-MBD: Calcium level elevated (when corrected for low albumin) with phosphorus level at goal.  Continue sevelamer for phosphorus binding. 5. Nutrition: Continue current renal diet with fluid restriction and ongoing oral nutritional supplementation. 6. Hypotension: Chronic, remains on midodrine.  Monitor with hemodialysis. 7.  Stage IV sacral decubitus ulcer: Ongoing aggressive wound care status post debridement/completion of meropenem; wound VAC reapplication to be addressed by wound care.   Kelly Splinter  MD 05/28/2020, 3:44 PM    Subjective:   Seen in room, no c/o's.    Objective:   BP 135/87 (BP Location: Right Arm)   Pulse 92   Temp 98.7 F (37.1 C) (Oral)   Resp 18   Ht '5\' 2"'  (1.575 m)   Wt 96.2 kg   SpO2 98%   BMI 38.78 kg/m   Physical Exam: Gen: In pleasant disposition, lying down CVS: Pulse regular rhythm, normal rate, S1 and S2 normal Resp: Clear to auscultation bilaterally, no distinct rales or rhonchi.  Left IJ TDC in place. Abd: Soft, obese, nontender Ext: No lower extremity edema.  Thrombosed left brachiocephalic fistula.  OP HD:TTS  4h 39mn 450/800 2/2.25 bath 111.5kg Hep 2000 TDC - L AVF clotted while here, using TDC now  - darbe  50 ug q week, last 9/7 - calc 1.0 tiw - 9/9 Hb 10.0, tsat 22%  Labs: BMET Recent Labs  Lab 05/22/20 1232 05/24/20 1332 05/27/20 1640  NA 132* 130* 134*  K 4.5 4.6 4.6  CL 93* 92* 95*  CO2 '23 22 25  ' GLUCOSE 166* 204* 191*  BUN 60* 55* 62*  CREATININE 7.40* 7.16* 8.08*  CALCIUM 9.9 10.1 10.2  PHOS 3.5 3.8 3.0   CBC Recent Labs  Lab 05/22/20 1232 05/24/20 1332 05/27/20 1640  WBC 10.1 13.6* 10.3  HGB 7.3* 7.1* 7.0*  HCT 22.8* 22.3* 22.3*  MCV 86.0 86.8 88.5  PLT 359 327 366      Medications:    . sodium chloride   Intravenous Once  . B-complex with vitamin C  1 tablet Oral Daily  . brimonidine  1 drop Both Eyes TID  . brinzolamide  1 drop Both Eyes TID  . chlorhexidine gluconate (MEDLINE KIT)  15 mL Mouth Rinse BID  . Chlorhexidine Gluconate Cloth  6 each Topical Daily  . Chlorhexidine Gluconate Cloth  6 each Topical Q0600  . clonazepam  0.25 mg Oral Daily  . collagenase   Topical Daily  . darbepoetin (ARANESP) injection - DIALYSIS  150 mcg Intravenous Q Thu-HD  . feeding supplement (GLUCERNA SHAKE)  237 mL Oral TID WC  . heparin  5,000 Units Subcutaneous Q8H  . hydrocerin   Topical Daily  . insulin aspart  0-6 Units Subcutaneous TID AC & HS  . insulin glargine  4 Units  Subcutaneous QHS  . latanoprost  1 drop Both Eyes q AM  . levothyroxine  125 mcg Oral Q0600  . midodrine  10 mg Oral TID WC  . multivitamin  1 tablet Oral QHS  . nutrition supplement (JUVEN)  1 packet Oral BID BM  . pantoprazole  40 mg Oral Daily  . QUEtiapine  50 mg Oral QHS  . sevelamer carbonate  1,600 mg Oral TID WC

## 2020-05-28 NOTE — Progress Notes (Signed)
Patient ID: Wanda Ramirez, female   DOB: 06/19/1970, 49 y.o.   MRN: [id number]   Patient [facility] referral sent to [facility], [facility], [place], and [place], [place]

## 2020-05-28 NOTE — Progress Notes (Signed)
Occupational Therapy Session Note  Patient Details  Name: Wanda Ramirez MRN: [id number] Date of Birth: 09/01/1970  Today's Date: 05/28/2020 OT Individual Time: 1103-1159 OT Individual Time Calculation (min): 56 min   Skilled Therapeutic Interventions/Progress Updates:    Pt greeted in the bed and premedicated for pain. ADL needs were met. Simulated ambulatory transfer to BSC completed using the RW with supervision assist (after donning pants EOB sit<stand with the RW and supervision). Pt then transferred to the w/c where she was escorted to the outdoor patio. Printed her a HEP for the UEs and we reviewed program together using the orange theraband. Also printed her an energy conservation packet and we discussed functional implications for home. Encouraged her to assist mother with IADLs to keep working on standing endurance and activity tolerance. Pt seemed receptive to education. She was returned to the unit via w/c and opted to remain sitting up for lunch. Left her with all needs within reach. Education packets were placed inside her health resource notebook.   Therapy Documentation Precautions:  Precautions Precautions: Fall,Other (comment) Precaution Comments: Sacral wound Restrictions Weight Bearing Restrictions: No ADL: ADL Eating: Set up Where Assessed-Eating: Chair Grooming: Supervision/safety Where Assessed-Grooming: Edge of bed Upper Body Bathing: Minimal assistance Where Assessed-Upper Body Bathing: Edge of bed Lower Body Bathing: Moderate assistance Where Assessed-Lower Body Bathing: Edge of bed Upper Body Dressing: Supervision/safety Where Assessed-Upper Body Dressing: Edge of bed Lower Body Dressing: Moderate assistance Where Assessed-Lower Body Dressing: Edge of bed Toileting: Maximal assistance Where Assessed-Toileting: Bedside Commode Toilet Transfer: Moderate assistance Toilet Transfer Method: Stand pivot Toilet Transfer Equipment: Extra wide bedside  commode Tub/Shower Transfer: Not assessed Walk-In Shower Transfer: Not assessed      Therapy/Group: Individual Therapy   A  05/28/2020, 12:37 PM  

## 2020-05-28 NOTE — Progress Notes (Signed)
[place] PHYSICAL MEDICINE & REHABILITATION PROGRESS NOTE   Subjective/Complaints: C/o pain from ingrown toenail. Discussed that podiatry usually trims toenails outpatient. Patient states her family is afraid to trim them due to her diabetes.  She has no other complaints. Breathing well off oxygen!  ROS- Pt denies SOB, abd pain, CP, N/V/C/D, and vision changes, + pain at pressure injury site.    Objective:   No results found. Recent Labs    05/27/20 1640  WBC 10.3  HGB 7.0*  HCT 22.3*  PLT 366   Recent Labs    05/27/20 1640  NA 134*  K 4.6  CL 95*  CO2 25  GLUCOSE 191*  BUN 62*  CREATININE 8.08*  CALCIUM 10.2    Intake/Output Summary (Last 24 hours) at 05/28/2020 1204 Last data filed at 05/28/2020 0800 Gross per 24 hour  Intake 975 ml  Output 1500 ml  Net -525 ml     Pressure Injury 04/03/20 Left Stage 4 - Full thickness tissue loss with exposed bone, tendon or muscle. Updated as of 05/03/20:Wound type: surgically debrided Stage 4 Pressure Injury  Measurement: 17cm x 15cm x 4cm with tunneling at 6 oclock that is 9 cm  (Active)  04/03/20   Location:   Location Orientation: Left  Staging: Stage 4 - Full thickness tissue loss with exposed bone, tendon or muscle.  Wound Description (Comments): Updated as of 05/03/20:Wound type: surgically debrided Stage 4 Pressure Injury  Measurement: 17cm x 15cm x 4cm with tunneling at 6 oclock that is 9 cm deep and tunneling at 2 oclock that is 8cm  Present on Admission: Yes    Physical Exam: Vital Signs Blood pressure 123/83, pulse 89, temperature 98.6 F (37 C), temperature source Oral, resp. rate 20, height 5\' 2"  (1.575 m), weight 96.2 kg, SpO2 98 %. HEENT: healed trach  Gen: no distress, normal appearing Cardio: Reg rate Chest: normal effort, normal rate of breathing Abd: soft, non-distended Ext: no edema Skin: Sacral decubitus     05/23/2020 Neurologic: Cranial nerves II through XII intact, motor strength is  4/5 in bilateral deltoid, bicep, tricep, grip, hip flexor, knee extensors, ankle dorsiflexor and plantar flexor Sensory exam able to sense LT but reports numbness RIght little finger and Left toes Cerebellar exam normal finger to nose to finger as well as heel to shin in bilateral upper and lower extremities Musculoskeletal: Full range of motion in all 4 extremities. No joint swelling  Assessment/Plan: 1. Functional deficits which require 3+ hours per day of interdisciplinary therapy in a comprehensive inpatient rehab setting.  Physiatrist is providing close team supervision and 24 hour management of active medical problems listed below.  Physiatrist and rehab team continue to assess barriers to discharge/monitor patient progress toward functional and medical goals  Care Tool:  Bathing    Body parts bathed by patient: Face,Left upper leg,Left arm,Right arm,Chest,Front perineal area,Abdomen,Right upper leg,Buttocks,Right lower leg   Body parts bathed by helper: Front perineal area,Buttocks Body parts n/a: Buttocks   Bathing assist Assist Level: Total Assistance - Patient < 25%     Upper Body Dressing/Undressing Upper body dressing   What is the patient wearing?: Pull over shirt    Upper body assist Assist Level: Set up assist    Lower Body Dressing/Undressing Lower body dressing      What is the patient wearing?: Pants,Incontinence brief     Lower body assist Assist for lower body dressing: Supervision/Verbal cueing     Toileting Toileting    Toileting  assist Assist for toileting: Total Assistance - Patient < 25%     Transfers Chair/bed transfer  Transfers assist     Chair/bed transfer assist level: Minimal Assistance - Patient > 75% Chair/bed transfer assistive device: Programmer, multimedia   Ambulation assist      Assist level: Contact Guard/Touching assist Assistive device: Rollator Max distance: 10'   Walk 10 feet activity   Assist      Assist level: Contact Guard/Touching assist Assistive device: Walker-rolling   Walk 50 feet activity   Assist Walk 50 feet with 2 turns activity did not occur: Safety/medical concerns         Walk 150 feet activity   Assist Walk 150 feet activity did not occur: Safety/medical concerns         Walk 10 feet on uneven surface  activity   Assist Walk 10 feet on uneven surfaces activity did not occur: Safety/medical concerns         Wheelchair     Assist Will patient use wheelchair at discharge?: Yes Type of Wheelchair: Manual    Wheelchair assist level: Supervision/Verbal cueing Max wheelchair distance: 164ft    Wheelchair 50 feet with 2 turns activity    Assist        Assist Level: Supervision/Verbal cueing   Wheelchair 150 feet activity     Assist      Assist Level: Moderate Assistance - Patient 50 - 74%   Blood pressure 123/83, pulse 89, temperature 98.6 F (37 C), temperature source Oral, resp. rate 20, height 5\' 2"  (1.575 m), weight 96.2 kg, SpO2 98 %.    Medical Problem List and Plan: 1.  Functional and mobility deficits secondary to debility after COVID and associated complications including VDRF/trach and large sacral wound Has neuropathy, mononeuropathy multiplex pattern, no sig neurogenic pain             -patient may not yet shower             -ELOS/Goals: 12/29  Continue CIR .2.  Antithrombotics: -DVT/anticoagulation:  Pharmaceutical: Heparin             -antiplatelet therapy: N/A 3. Pain Management: Continue Oxycodone prn.  12/27- pt reports gets her pain meds for sacral decub after HD- continue regimen  4. Mood: LCSW to follow for evaluation and support.              -antipsychotic agents: N/A 5. Neuropsych: This patient is capable of making decisions on her own behalf--dicussed code status -->SHE WANTS TO BE FULL CODE . 6.Large stage IV sacral/buttock wound/Wound Care: Meropenum completed 05/13/20.     -Juven tid,  glucerna tid and vitamin B w/C.               -Santyl to eschar on inferior flap and wet to dry dressing changes bid and prn to keep wound clean of stool to prevent contamination.              -Pressure relief measure with air mattress.                -WBC trending upwards--recheck in am   12/18- WBC down to 10.8 from 14k- will monitor 7. Fluids/Electrolytes/Nutrition: Renal diet with 1200 cc FR. Strict I/O with daily weights.              -check labs with HD 8.T2DM: On glucerna tid with meals. Monitor BS ac/hs--On Lantus 3 units daily with SSI for elevated BS.  CBG (last  3)  Recent Labs    05/27/20 2108 05/28/20 0619 05/28/20 1157  GLUCAP 190* 126* 181*  Some lability we will continue to monitor.  9. Hypotension: Monitor BP tid--especially with increase in activity. Continue Midodrine 10 mg tid.  Vitals:   05/27/20 2043 05/28/20 0522  BP: 139/83 123/83  Pulse: 97 89  Resp: 20 20  Temp: 98.8 F (37.1 C) 98.6 F (37 C)  SpO2: 98% 98%  Controlled on Midodrine 10mg  TID, 05/27/2020 Hypthyroid: Stable on supplement.  10. ESRD:  HD -TTS with nephrology to assist with management. Schedule HD at the end of the day to help with tolerance of activity. Sevelmar tid for metabolic bone disease. Hyperkalemia managed with HD.  Per Nephro 11. Anemia of critical illness/anemia of CKD: On Aranesp 150 mc/week. Hgb stable at 7.2 range past week.  Transfuse prn Hgb<7.0.    12/26: Hgb down to 7: continue to monitor with HD.  12.  Anxiety: Continue low dose klonopin in am and Seroquel at nights to help with sleep.  13. Leukocytosis.: afebrile recent UA C and S neg ,sacral wound clean  no resp sx, monitor   WBC 10.3 on 12/26- monitor with HD   LOS: 13 days A FACE TO FACE EVALUATION WAS PERFORMED  Martha Clan P Regena Delucchi 05/28/2020, 12:04 PM

## 2020-05-28 NOTE — Progress Notes (Signed)
Physical Therapy Session Note  Patient Details  Name: Wanda Ramirez MRN: [id number] Date of Birth: 1970-12-07  Today's Date: 05/28/2020 PT Individual Time: 1305-1400 PT Individual Time Calculation (min): 55 min   Short Term Goals: Week 2:  PT Short Term Goal 1 (Week 2): STG=LTG due to ELOS  Skilled Therapeutic Interventions/Progress Updates: Pt presented in bed agreeable to therapy. Pt c/o stomach pain but no intervention requested at this time. Performed bed mobility with supervision and use of bed features. Performed ambulatory transfer to w/c with RW and supervision. Pt transported to rehab gym for energy conservation and ascended/descended x 2 steps with R rail with CGA and HR after activty 106. Pt then transported to ortho gym and performed car transfer to sedan height with supervision overall. Discussed with pt bed mobility as pt will use standard bed at home, although ADL apt occupied at this time pt was able to perform sit to/from supine with supervision from high/low mat and demonstrated good mechanics. Participated in ambulation ~140ft with RW with x1 standing rest break with PTA providing continued education on energy conservation particularly immediately upon d/c with pt verbalizing understanding. Pt transported back to room at end of session with pt indicating need for BM. Performed ambulatory transfer with RW to toilet and performed toilet transfer with close S. Pt left sitting on toilet verbalizing understanding to use pull cord once completed and Cam, NT notifed of pt's disposition.      Therapy Documentation Precautions:  Precautions Precautions: Fall,Other (comment) Precaution Comments: Sacral wound Restrictions Weight Bearing Restrictions: No General:   Vital Signs: Therapy Vitals Temp: 98.7 F (37.1 C) Temp Source: Oral Pulse Rate: 92 Resp: 18 BP: 135/87 Patient Position (if appropriate): Lying Oxygen Therapy SpO2: 98 % O2 Device: Room Air Pain: Pain  Assessment Pain Scale: 0-10 Pain Score: 0-No pain    Therapy/Group: Individual Therapy  Daimian Sudberry  Catina Nuss, PTA  05/28/2020, 3:57 PM

## 2020-05-28 NOTE — Progress Notes (Addendum)
Patient ID: Wanda Ramirez, female   DOB: [birth year], 49 y.o.   MRN: [id number]   Family will be coming in today for nursing education of wound. Daughter will be attending family education tomorrow morning with PT and OT. SW added Clearence Ped to list to attend education with patient daughter.   [place], [place]

## 2020-05-28 NOTE — Progress Notes (Signed)
Patient ID: Wanda Ramirez, female   DOB: 1970/11/04, 49 y.o.   MRN: [id number]   [place] ordered through [place].

## 2020-05-28 NOTE — Progress Notes (Signed)
Occupational Therapy Session Note  Patient Details  Name: Wanda Ramirez MRN: [id number] Date of Birth: 10/25/1970  Today's Date: 05/28/2020 OT Individual Time: 0901-1000 OT Individual Time Calculation (min): 59 min    Short Term Goals: Week 1:  OT Short Term Goal 1 (Week 1): Pt will don BLE into pants with min A. OT Short Term Goal 1 - Progress (Week 1): Met OT Short Term Goal 2 (Week 1): Pt will complete LB bathing with min A. OT Short Term Goal 2 - Progress (Week 1): Met OT Short Term Goal 3 (Week 1): Pt will complete sit<>stand in prep for standing ADLs with close supervision. OT Short Term Goal 3 - Progress (Week 1): Progressing toward goal  Skilled Therapeutic Interventions/Progress Updates:     Pt received side-lying in bed, agreeable to therapy. Pt HR read at 130 bpm, satO2: 95% on RA at beginning of session. Reports bed settings are locked and unable to be adjusted, nursing aware. Denies pain this session.  Completes bed mobility with overall close supervision (side roll > R, side lying > sit EOB). Sit > stand + amb to toilet with close supervision + RW. Continent void of bowels, req total A for posterior peri care 2/2 sacral bandages, close supervision for sit <>stand toilet transfer with RW + R grab bar. Amb to w/c to get washed up at sink. Bathed full-body minus L foot + buttocks with close supervision, require total A to bathe/dry/don/doff sock/apply vaseline for L foot. Applied vaseline to R foot with close supervision.Doffed old brief, hospital gown with close supervision. Donned new brief with mod A to adjust on B hips in standing. Donned new hospital gown with min A to tie in back + adjust buttons on sleeves. Stood to make bed + clean off side table for improved activity tolerance + dynamic standing balance with overall CGA. Had anterior LOB when making bed, req mod A to correct, and req seated rest break after on bed 2/2 fatigue. HR read at 122 bpm, satO2: 98% on RA, then  read at 112 bpm and satO2:97% on RA after rest break. Requested to get back into bed at end of session, completed bed mobility with close supervision. Left sidelying with bed alarm engaged, call bell in reach, all immediate needs met.  Therapy Documentation Precautions:  Precautions Precautions: Fall,Other (comment) Precaution Comments: Sacral wound Restrictions Weight Bearing Restrictions: No Pain: Pain Assessment Pain Scale: 0-10 Pain Score: 0-No pain ADL: See Care Tool for more details.  Therapy/Group: Individual Therapy  Volanda Napoleon 05/28/2020, 12:33 PM

## 2020-05-28 NOTE — Progress Notes (Signed)
Patient ID: Wanda Ramirez, female   DOB: 1971-04-02, 49 y.o.   MRN: [id number]  Patient declined by [facility]

## 2020-05-28 NOTE — Consult Note (Signed)
Neuropsychological Consultation   Patient:   Wanda Ramirez   DOB:   10/14/1970  MR Number:  [id number]  Location:  [facility] [id number] [place] [address] Dept: [place]: [phone]           Date of Service:   05/28/2020  Start Time:   8 AM End Time:   9 AM  Provider/Observer:  Ilean Skill, Psy.D.       Clinical Neuropsychologist       Billing Code/Service: [phone]  Chief Complaint:    Wanda Ramirez is a 49 year old female with history of type 2 diabetes, end-stage renal disease, morbid obesity, chronic respiratory failure.  Patient was admitted on 02/13/2020 with acute on chronic respiratory failure due to COVID-19 pneumonia.  Patient was treated with remdesivir, steroids, tocilizumab as well as broad-spectrum antibiotics.  Patient developed lethargy with increased oxygen needs due to ARDS and was intubated 9/26.  Negative for pulmonary embolism.  Patient was extubated on 03/07/2020 but developed stridor and increased WOB and fever due to MSSA pneumonia requiring reintubation and ultimately acquired tracheostomy on 10/18.  Patient developed left buttock wound with exposed sacrum with wound cultures positive for bacterial infection.  There were issues during her hospitalization of delirium/metabolic encephalopathy but this has resolved.  Patient ultimately transferred to [facility] for rehabilitation efforts due to functional decline.  Reason for Service:  Patient referred for neuropsychological consultation due to coping adjustment with extended hospital stay from severe critical illness resulting from COVID-19 pneumonia and subsequent numerous medical complications.  Below is the HPI for the current admission.  JAS:[id number] L. [place] is a 49 year old female with history of T2DM, ESRD-HD TTS,morbid obesity,chronic respiratory failure--3 L oxygen dependent who was admitted on 02/13/20  with acute on chronic respiratory failure due toCovid19 PNA.She was treated with remdesiver, steroids, tocilizumab X 1 as well as broad spectrum antibiotics but developed lethargy with increase in oxygen needs due to ARDS and was intubated 09/26. V/Q scan negative for PE. Nephrology following for management ofHDand required CRRT due to hypotension. She has had issues with LUA AVF clotting requring TDC X 2(last by Dr. Anselm Pancoast on 09/28). She developed thrombocytopenia --HIT panel negative and was kept fully anticoagulated with IV heparin due to RV dysfunction and elevated D dimer. She was extubated on 10/06 but developed stridor with increased WOB and fever T-103 due to MSSA PNA requiring reintubation and ultimately required tracheostomy 10/18.   She was on tube feeds for nutritional support but developed diarrhea requiring flexiseal.She developedlarge stage IV left buttock/sacral wound with exposed sacrum and bloody drainage with anemia and fevers. She was taken to OR for I & D 11/02 by Dr. Bobbye Morton (4 units PRBC/500 cc albumin administered perioperatively). Woundcultures positive for enteroccocus faecalis and ESBL E coliand treated with Meropenum She continued to have loose stools with oozing requiring frequent dressing changes. Hydrotherapy ongoing and wound VAC placed briefly but wound started developing significant "C-diff type" odor with thick yellow drainage on 12/01. Wound VAC was removed with dakins used for local care and as drainage resolved replaced 12/06 but VAC not maintaining seal due to significant amount of wound drainage. Decision made by WOC/CCS to discontinue VAC on 12/13 with moist damp to dressing changes for now. CCS recommends replacing wound VAC if drainage resolves --in approx a week or so?   Issues with delirium/metabolic encephalopathy has resolved. She was extubated to BIPAP, tolerated capping  andto bedecannulated today. Dr. Tamala Julian recommends ENT referral if stoma does not  close in 4 weeks. Hypotension being managed with midodrine on board and anemia of chronic illness being treated with aranesp --IV iron on hold due to infection. She is tolerating regular diet and showing improvement in activity tolerance. Therapy ongoing and CIR recommended due to functional decline.  Current Status:  Upon entering the room the patient was laying on her right side and awake and alert.  Patient with pleasant demeanor and quickly engaged in conversation.  Cognition and mental status appeared to be within normal limits.  The patient acknowledged times of being "depressed" with all that she has missed and how much she is gone through.  The patient reports it has been very difficult not to spend time around her family and there are many family members she has not seen since 1 September.  The patient is aware that she has been discharged on 29 December and is looking forward to discharge but a little apprehensive due to concerns about her medical frailty.  Patient is getting stronger and walking around and continues to have recovery process for her buttocks/sacral wound.  While the patient acknowledges that she has been frustrated melancholic about her overall situation the patient denies that depressive features have kept her from being able to fully participate in therapeutic efforts and her motivation to continue to improve and get better remains high.  Patient reports that her mood has been better and she is getting closer to discharge.  At this point, I do not think the patient is dealing with a clinical depression but more normal emotional responses to her medical status.  Behavioral Observation: Wanda Ramirez  presents as a 49 y.o.-year-old Right African American Female who appeared her stated age. her dress was Appropriate and she was Well Groomed and her manners were Appropriate to the situation.  her participation was indicative of Appropriate and Attentive behaviors.  There were physical  disabilities noted.  she displayed an appropriate level of cooperation and motivation.     Interactions:    Active Appropriate  Attention:   within normal limits and attention span and concentration were age appropriate  Memory:   within normal limits; recent and remote memory intact  Visuo-spatial:  within normal limits  Speech (Volume):  low  Speech:   normal; normal  Thought Process:  Coherent and Relevant  Though Content:  WNL; not suicidal and not homicidal  Orientation:   person, place, time/date and situation  Judgment:   Good  Planning:   Fair  Affect:    Appropriate  Mood:    Dysphoric  Insight:   Good  Intelligence:   normal  Medical History:   Past Medical History:  Diagnosis Date  . Arthritis   . Asthma   . Chronic back pain   . Diabetes mellitus ([place])   . ESRD (end stage renal disease) on dialysis ([facility])    TTS  . Essential hypertension   . GERD (gastroesophageal reflux disease)   . Glaucoma   . Hyperlipidemia   . Hypothyroidism   . Morbid obesity ([place])   . Peripheral neuropathy   . Sleep apnea    wears CPAP, does not know setting         Patient Active Problem List   Diagnosis Date Noted  . Debility 05/15/2020  . Sacral wound   . Palliative care encounter   . Septic shock ([place])   . Cellulitis of buttock   .  Fever   . Status post tracheostomy ([place])   . Aspiration into airway   . Pressure injury of skin 03/07/2020  . ARDS (adult respiratory distress syndrome) ([place])   . Acute respiratory failure with hypoxemia ([place])   . Encephalopathy acute   . HCAP (healthcare-associated pneumonia)   . COVID-19 02/14/2020  . Weakness 03/17/2016  . Hypotension 03/17/2016  . Leukocytosis 03/17/2016  . Hyperlipidemia 03/17/2016  . Peripheral neuropathy 03/17/2016  . Precordial chest pain 03/17/2016  . End stage renal disease on dialysis ([place]) 03/16/2016  . Diabetes mellitus type 2 in obese ([place]) 03/16/2016  . Essential hypertension 03/16/2016  .  Hypothyroidism 03/16/2016  . Glaucoma 03/16/2016  . GERD (gastroesophageal reflux disease) 03/16/2016  . OSA (obstructive sleep apnea) 03/16/2016    Psychiatric History:  Patient with no prior psychiatric history  Family Med/Psych History:  Family History  Problem Relation Age of Onset  . Diabetes Mother   . Hyperlipidemia Mother   . Kidney disease Father     Impression/DX:  Wanda Ramirez is a 49 year old female with history of type 2 diabetes, end-stage renal disease, morbid obesity, chronic respiratory failure.  Patient was admitted on 02/13/2020 with acute on chronic respiratory failure due to COVID-19 pneumonia.  Patient was treated with remdesivir, steroids, tocilizumab as well as broad-spectrum antibiotics.  Patient developed lethargy with increased oxygen needs due to ARDS and was intubated 9/26.  Negative for pulmonary embolism.  Patient was extubated on 03/07/2020 but developed stridor and increased WOB and fever due to MSSA pneumonia requiring reintubation and ultimately acquired tracheostomy on 10/18.  Patient developed left buttock wound with exposed sacrum with wound cultures positive for bacterial infection.  There were issues during her hospitalization of delirium/metabolic encephalopathy but this has resolved.  Patient ultimately transferred to [facility] for rehabilitation efforts due to functional decline.  Upon entering the room the patient was laying on her right side and awake and alert.  Patient with pleasant demeanor and quickly engaged in conversation.  Cognition and mental status appeared to be within normal limits.  The patient acknowledged times of being "depressed" with all that she has missed and how much she is gone through.  The patient reports it has been very difficult not to spend time around her family and there are many family members she has not seen since 1 September.  The patient is aware that she has been discharged on 29 December and is looking forward to  discharge but a little apprehensive due to concerns about her medical frailty.  Patient is getting stronger and walking around and continues to have recovery process for her buttocks/sacral wound.  While the patient acknowledges that she has been frustrated melancholic about her overall situation the patient denies that depressive features have kept her from being able to fully participate in therapeutic efforts and her motivation to continue to improve and get better remains high.  Patient reports that her mood has been better and she is getting closer to discharge.  At this point, I do not think the patient is dealing with a clinical depression but more normal emotional responses to her medical status.    Disposition/Plan:  Today we worked on coping and adjustment issues with extended hospital stay post Covid pneumonia and significant wound/infection.  The patient is being discharged on the 29th and at this point her emotional response appears appropriate not dealing with a significant major clinical depressive condition.  Diagnosis:    Debility post COVID 19  Electronically Signed   _______________________ Ilean Skill, Psy.D. Clinical Neuropsychologist

## 2020-05-28 NOTE — Progress Notes (Signed)
Renal Navigator reviewed chart and notes estimated date of discharge on 05/30/20. Navigator spoke with Nephrologist, HD unit and CIR staff to ensure that we trial patient in recliner prior to discharge. Nephrologist has written orders for HD treatment tomorrow to be in recliner. CIR PTA reports patient has been making great progress with sitting for several hours between therapies to be prepared for HD in recliner.  Navigator requested update from outpatient clinic/[facility] of seat time for patient, as it has been quite some time since she has been at clinic for treatment and Navigator is unsure if her time remains the same. Navigator awaiting update and will inform CIR CSW. Team discussed ordering a pressure-reducing cushion to help with sitting and CSW will order.   Alphonzo Cruise, [place] Renal Navigator [phone]

## 2020-05-29 ENCOUNTER — Inpatient Hospital Stay (HOSPITAL_COMMUNITY): Payer: Medicaid Other

## 2020-05-29 ENCOUNTER — Encounter (HOSPITAL_COMMUNITY): Payer: Medicaid Other | Admitting: Occupational Therapy

## 2020-05-29 ENCOUNTER — Ambulatory Visit (HOSPITAL_COMMUNITY): Payer: Medicaid Other | Admitting: Physical Therapy

## 2020-05-29 LAB — RENAL FUNCTION PANEL
Albumin: 2.7 g/dL — ABNORMAL LOW (ref 3.5–5.0)
Anion gap: 15 (ref 5–15)
BUN: 47 mg/dL — ABNORMAL HIGH (ref 6–20)
CO2: 21 mmol/L — ABNORMAL LOW (ref 22–32)
Calcium: 10.5 mg/dL — ABNORMAL HIGH (ref 8.9–10.3)
Chloride: 97 mmol/L — ABNORMAL LOW (ref 98–111)
Creatinine, Ser: 7.32 mg/dL — ABNORMAL HIGH (ref 0.44–1.00)
GFR, Estimated: 6 mL/min — ABNORMAL LOW (ref >60)
Glucose, Bld: 180 mg/dL — ABNORMAL HIGH (ref 70–99)
Phosphorus: 3 mg/dL (ref 2.5–4.6)
Potassium: 4.4 mmol/L (ref 3.5–5.1)
Sodium: 133 mmol/L — ABNORMAL LOW (ref 135–145)

## 2020-05-29 LAB — CBC
HCT: 27.2 % — ABNORMAL LOW (ref 36.0–46.0)
Hemoglobin: 9 g/dL — ABNORMAL LOW (ref 12.0–15.0)
MCH: 28.8 pg (ref 26.0–34.0)
MCHC: 33.1 g/dL (ref 30.0–36.0)
MCV: 87.2 fL (ref 80.0–100.0)
Platelets: 380 10*3/uL (ref 150–400)
RBC: 3.12 MIL/uL — ABNORMAL LOW (ref 3.87–5.11)
RDW: 16.4 % — ABNORMAL HIGH (ref 11.5–15.5)
WBC: 10.2 10*3/uL (ref 4.0–10.5)
nRBC: 0 % (ref 0.0–0.2)

## 2020-05-29 LAB — GLUCOSE, CAPILLARY
Glucose-Capillary: 138 mg/dL — ABNORMAL HIGH (ref 70–99)
Glucose-Capillary: 159 mg/dL — ABNORMAL HIGH (ref 70–99)
Glucose-Capillary: 211 mg/dL — ABNORMAL HIGH (ref 70–99)
Glucose-Capillary: 199 mg/dL — ABNORMAL HIGH (ref 70–99)

## 2020-05-29 MED ORDER — COLLAGENASE 250 UNIT/GM EX OINT: TOPICAL_OINTMENT | Freq: Every day | CUTANEOUS | 6 refills | Status: DC

## 2020-05-29 MED ORDER — HEPARIN SODIUM (PORCINE) 1000 UNIT/ML IJ SOLN
INTRAMUSCULAR | Status: AC
  Filled 2020-05-29: qty 2

## 2020-05-29 MED ORDER — ALTEPLASE 2 MG IJ SOLR
INTRAMUSCULAR | Status: AC
  Administered 2020-05-29: 2 mg
  Filled 2020-05-29: qty 4

## 2020-05-29 MED ORDER — HEPARIN SODIUM (PORCINE) 1000 UNIT/ML DIALYSIS: 2000.0000 [IU] | Freq: Once | INTRAMUSCULAR | Status: DC

## 2020-05-29 MED ORDER — CHLORHEXIDINE GLUCONATE 0.12 % MT SOLN
OROMUCOSAL | Status: AC
  Administered 2020-05-29: 23:00:00 15 mL via OROMUCOSAL
  Filled 2020-05-29: qty 15

## 2020-05-29 MED ORDER — ALTEPLASE 2 MG IJ SOLR: 2.0000 mg | Freq: Once | INTRAMUSCULAR | Status: AC

## 2020-05-29 MED ORDER — INSULIN ASPART 100 UNIT/ML ~~LOC~~ SOLN: 2.0000 [IU] | Freq: Three times a day (TID) | SUBCUTANEOUS | 11 refills | Status: DC | PRN

## 2020-05-29 MED ORDER — LEVOTHYROXINE SODIUM 125 MCG PO TABS: 125.0000 ug | ORAL_TABLET | Freq: Every day | ORAL | 0 refills | Status: AC

## 2020-05-29 MED ORDER — CLONAZEPAM 0.25 MG PO TBDP: 0.2500 mg | ORAL_TABLET | Freq: Every day | ORAL | 0 refills | Status: DC

## 2020-05-29 MED ORDER — HEPARIN SODIUM (PORCINE) 1000 UNIT/ML IJ SOLN
INTRAMUSCULAR | Status: AC
  Filled 2020-05-29: qty 5

## 2020-05-29 MED ORDER — INSULIN GLARGINE 100 UNIT/ML ~~LOC~~ SOLN
4.0000 [IU] | Freq: Every day | SUBCUTANEOUS | 11 refills | Status: DC
Start: 2020-05-29 — End: 2020-05-30

## 2020-05-29 MED ORDER — PENTAFLUOROPROP-TETRAFLUOROETH EX AERO: 1.0000 "application " | INHALATION_SPRAY | CUTANEOUS | 0 refills | Status: DC | PRN

## 2020-05-29 MED ORDER — QUETIAPINE FUMARATE 50 MG PO TABS: 50.0000 mg | ORAL_TABLET | Freq: Every day | ORAL | 0 refills | Status: AC

## 2020-05-29 MED ORDER — ACETAMINOPHEN 325 MG PO TABS
325.0000 mg | ORAL_TABLET | ORAL | Status: AC | PRN
Start: 2020-05-29 — End: ?

## 2020-05-29 MED ORDER — PANTOPRAZOLE SODIUM 40 MG PO TBEC: 40.0000 mg | DELAYED_RELEASE_TABLET | Freq: Every day | ORAL | 0 refills | Status: AC

## 2020-05-29 MED ORDER — SEVELAMER CARBONATE 800 MG PO TABS: 1600.0000 mg | ORAL_TABLET | Freq: Three times a day (TID) | ORAL | 0 refills | Status: DC

## 2020-05-29 MED ORDER — RENA-VITE PO TABS: 1.0000 | ORAL_TABLET | Freq: Every day | ORAL | 0 refills | Status: DC

## 2020-05-29 MED ORDER — OXYCODONE HCL 5 MG PO TABS: 5.0000 mg | ORAL_TABLET | Freq: Four times a day (QID) | ORAL | 0 refills | Status: DC | PRN

## 2020-05-29 MED ORDER — JUVEN PO PACK: 1.0000 | PACK | Freq: Two times a day (BID) | ORAL | 0 refills | Status: DC

## 2020-05-29 MED ORDER — MIDODRINE HCL 10 MG PO TABS: 10.0000 mg | ORAL_TABLET | Freq: Three times a day (TID) | ORAL | 0 refills | Status: DC

## 2020-05-29 MED ORDER — OXYCODONE HCL 5 MG PO TABS
5.0000 mg | ORAL_TABLET | ORAL | Status: DC | PRN
  Administered 2020-05-29 – 2020-05-30 (×3): 5 mg via ORAL
  Filled 2020-05-29 (×3): qty 1

## 2020-05-29 NOTE — Progress Notes (Signed)
Renal Navigator confirmed patient's outpatient HD seat time with her clinic/[place]: TTS 11:15am.  Navigator met with patient in HD while she was having treatment in recliner. Navigator commends her for all her hard work in rehab and wishes her the best for the future. Patient was extremely pleasant and thanked Navigator. She states she will go home with her mother and has great family support. She states her daughter will take her to outpatient HD. She is hopeful that she will still be able to discharge tomorrow even though she now needs to have a catheter exchange.   Alphonzo Cruise, [place] Renal Navigator [phone]

## 2020-05-29 NOTE — Progress Notes (Signed)
Pt. HD tx resumed after cathflo x30 mins. HD catheter somewhat better, but remains positional, unable to reach ordered BFR. Veneta Penton, PA aware. Orders to complete HD tx as tolerated.

## 2020-05-29 NOTE — Progress Notes (Signed)
Physical Therapy Session Note  Patient Details  Name: Wanda Ramirez MRN: [id number] Date of Birth: [birth year]  Today's Date: 05/29/2020 PT Individual Time: 0900-1000 PT Individual Time Calculation (min): 60 min   Short Term Goals: Week 2:  PT Short Term Goal 1 (Week 2): STG=LTG due to ELOS  Skilled Therapeutic Interventions/Progress Updates: Pt presented in w/c with dgts present agreeable to therapy. Pt c/o some soreness at buttock but not rateable and pt did not request any intervention. PTA discussed pt's level of care expected to be supervision and does note require any physical assist. Pt transported to ortho gym and demonstrated car transfer, then transported to ortho gym and discussed stair set up. Per pt's family noted that there is NOT a rail at the back steps to enter. Discussed that pt will need to enter stepping backwards with RW and will require second person to stabilize RW. Pt and dgts seemed hesitant however PTA was able to demonstrate then had pt perform with minA for RW management only. Pt was able to safely step up with CGA. Pt then performed curb step (4in) forward to mimic landing entry upon 3rd step. Pt with increased exertion after stairs with pt taking extended seated rest while PTA explained why pt unable to enter house forwards with RW with dgts verbally understanding once demonstrated. Pt then performed ascending/descended x 3 steps with RW and dgts managing RW with CGA overall. Pt expressed was nervous but felt more comfortable on second and third trial. At end of session pt transported back to room and performed ambulatory transfer to bed from w/c with modI for transfers and supervision for gait. Pt returned to bed mod I and handed off to nsg for completion of family ed for wound care.      Therapy Documentation Precautions:  Precautions Precautions: Fall,Other (comment) Precaution Comments: Sacral wound Restrictions Weight Bearing Restrictions: No General:    Vital Signs: Therapy Vitals Temp: 98.5 F (36.9 C) Temp Source: Oral Pulse Rate: 96 Resp: 19 BP: (!) 135/92 Patient Position (if appropriate): Lying Oxygen Therapy SpO2: 98 % O2 Device: Room Air Pain: Pain Assessment Pain Scale: 0-10 Pain Score: 0-No pain   Therapy/Group: Individual Therapy  Meldrick Buttery  Alin Chavira, PTA  05/29/2020, 4:08 PM

## 2020-05-29 NOTE — Progress Notes (Signed)
Physical Therapy Discharge Summary  Patient Details  Name: Wanda Ramirez MRN: [id number] Date of Birth: 09/16/1970  Today's Date: 05/29/2020 PT Individual Time: 0900-1000 PT Individual Time Calculation (min): 60 min    Patient has met 12 of 12 long term goals due to improved activity tolerance, improved balance, increased strength, decreased pain, improved awareness and improved coordination.  Patient to discharge at an ambulatory level Supervision.   Patient's care partner is independent to provide the necessary physical assistance at discharge.  Reasons goals not met: N/A all goals met  Recommendation:  Patient will benefit from ongoing skilled PT services in home health setting to continue to advance safe functional mobility, address ongoing impairments in balance, endurance, safety, strength, and minimize fall risk.  Equipment: RW and WC  Reasons for discharge: treatment goals met  Patient/family agrees with progress made and goals achieved: Yes  PT Discharge Precautions/Restrictions Precautions Precautions: Fall;Other (comment) (sacral wound) Precaution Comments: Sacral wound Restrictions Weight Bearing Restrictions: No Vital Signs  Pain Pain Assessment Pain Scale: 0-10 Pain Score: 0-No pain Vision/Perception  Perception Perception: Within Functional Limits Praxis Praxis: Intact  Cognition Overall Cognitive Status: Within Functional Limits for tasks assessed Arousal/Alertness: Awake/alert Orientation Level: Oriented X4 Awareness: Appears intact Problem Solving: Appears intact Safety/Judgment: Appears intact Sensation Sensation Light Touch: Appears Intact Proprioception: Appears Intact Coordination Gross Motor Movements are Fluid and Coordinated: Yes Fine Motor Movements are Fluid and Coordinated: Yes (delayed R thumb but functional for ADL) Motor  Motor Motor: Within Functional Limits  Mobility Bed Mobility Bed Mobility: Supine to Sit;Sit to  Supine Rolling Right: Independent with assistive device Rolling Left: Independent with assistive device Supine to Sit: Independent with assistive device Sit to Supine: Independent with assistive device Transfers Transfers: Sit to Stand;Stand to Sit Sit to Stand: Independent with assistive device Stand to Sit: Supervision/Verbal cueing Stand Pivot Transfers: Supervision/Verbal cueing Stand Pivot Transfer Details: Verbal cues for precautions/safety;Verbal cues for safe use of DME/AE Transfer (Assistive device): Rolling walker Locomotion  Gait Ambulation: Yes Gait Assistance: Supervision/Verbal cueing Gait Distance (Feet): 100 Feet Assistive device: Rolling walker Gait Gait: Yes Gait Pattern: Impaired Gait Pattern: Wide base of support;Decreased stride length;Step-through pattern Gait velocity: decreased Stairs / Additional Locomotion Stairs: Yes Stairs Assistance: Contact Guard/Touching assist Stair Management Technique: One rail Right Number of Stairs: 2 Height of Stairs: 6 Wheelchair Mobility Wheelchair Mobility: Yes Wheelchair Assistance: Chartered loss adjuster: Both upper extremities Wheelchair Parts Management: Needs assistance Distance: 172f  Trunk/Postural Assessment  Cervical Assessment Cervical Assessment: Exceptions to [facility]or[facility] Assessment Thoracic Assessment: Exceptions to W[facility] SpringsLumbar Assessment Lumbar Assessment: Within Functional Limits Postural Control Postural Control: Deficits on evaluation  Balance Balance Balance Assessed: Yes Static Sitting Balance Static Sitting - Balance Support: Feet supported Static Sitting - Level of Assistance: 6: Modified independent (Device/Increase time) Dynamic Sitting Balance Dynamic Sitting - Balance Support: Feet supported;During functional activity Dynamic Sitting - Level of Assistance: 6: Modified independent (Device/Increase time) Dynamic Sitting - Balance Activities: Lateral lean/weight  shifting;Reaching for objects;Forward lean/weight shifting;Reaching across midline Sitting balance - Comments: dynamic sitting during toileting and LB dress Static Standing Balance Static Standing - Balance Support: Bilateral upper extremity supported Static Standing - Level of Assistance: 6: Modified independent (Device/Increase time) Dynamic Standing Balance Dynamic Standing - Balance Support: Left upper extremity supported;During functional activity Dynamic Standing - Level of Assistance: 5: Stand by assistance Extremity Assessment  RUE Assessment RUE Assessment: Within Functional Limits General Strength Comments: 4 to 4+/5 roughly, weakness present but improved from eval  LUE Assessment LUE Assessment: Within Functional Limits General Strength Comments: 4 to 4+/5 roughly, weakness present but improved from eval RLE Assessment RLE Assessment: Within Functional Limits General Strength Comments: grossly 4/5 LLE Assessment LLE Assessment: Within Functional Limits General Strength Comments: grossly 4+/5    Rosita DeChalus 05/29/2020, 9:58 AM

## 2020-05-29 NOTE — Progress Notes (Signed)
Wheel chair and rw ordered through [place] [facility].

## 2020-05-29 NOTE — Discharge Summary (Signed)
Physician Discharge Summary  Patient ID: Wanda Ramirez MRN: [id number] DOB/AGE: 1971-02-24 49 y.o.  Admit date: 05/15/2020 Discharge date: 05/30/2020  Discharge Diagnoses:  Principal Problem:   Debility Active Problems:   End stage renal disease on dialysis [facility])   Diabetes mellitus type 2 in obese ([place])   Hypotension   Pressure injury of skin   Status post tracheostomy ([place])   Sacral wound   History of COVID-19   Discharged Condition: stable   Significant Diagnostic Studies: DG Chest 2 View  Result Date: 05/17/2020 CLINICAL DATA:  Leukocytosis EXAM: CHEST - 2 VIEW COMPARISON:  04/03/2020 FINDINGS: Previously noted tracheostomy and nasoenteric feeding tube have been removed. Left internal jugular hemodialysis catheter tip is unchanged within the right atrium. Mild right-sided volume loss is noted. Previously noted extensive asymmetric right-sided pulmonary infiltrate has significantly improved with minimal residual infiltrate noted within the right lung. Mild right basilar parenchymal scarring is noted. Left lung is clear. No pneumothorax or pleural effusion. Cardiac size is within normal limits. No acute bone abnormality. IMPRESSION: Significantly improved right lung pulmonary infiltrate with minimal residual infiltrate noted. Superimposed parenchymal scarring and mild right-sided volume loss now present. Electronically Signed   By: Fidela Salisbury MD   On: 05/17/2020 20:36   IR Fluoro Guide CV Line Left  Result Date: 05/30/2020 INDICATION: 49 year old female with nonfunctional tunneled hemodialysis catheter EXAM: IMAGE GUIDED EXCHANGE OF TUNNELED HEMODIALYSIS CATHETER MEDICATIONS: None ANESTHESIA/SEDATION: None. FLUOROSCOPY TIME:  Fluoroscopy Time: 0 minutes 12 seconds (12 mGy). COMPLICATIONS: None PROCEDURE: Informed written consent was obtained from the patient after a discussion of the risks, benefits, and alternatives to treatment. Questions regarding the procedure were  encouraged and answered. The left neck and chest including the indwelling catheter were prepped with chlorhexidine in a sterile fashion, and a sterile drape was applied covering the operative field. Maximum barrier sterile technique with sterile gowns and gloves were used for the procedure. A timeout was performed prior to the initiation of the procedure. The balloon and brown port on the indwelling catheter were aspirated removing the dwell. 1% lidocaine was used for local anesthesia. Stiff Glidewire was advanced through 1 of the ports into the IVC. Blunt dissection was used to free the catheter from the soft tissue tract. The catheter was then exchanged for a new 28 cm tip to cuff catheter. Cuff was buried into the tract approximately 1-2 cm. The introducers were removed from the lumens as well as the wire, blue and red ports were aspirated and flushed with saline, and then locked with heparin dwell. Catheter was sutured in position and a final image was stored. Patient tolerated the procedure well and remained hemodynamically stable throughout. No complications were encountered and no significant blood loss. IMPRESSION: Status post exchange of left IJ tunneled hemodialysis catheter for a new 28 cm tip to cuff catheter. Signed, Dulcy Fanny. Dellia Nims, RPVI [facility] Radiology Electronically Signed   By: Corrie Mckusick D.O.   On: 05/30/2020 11:34    Labs:  Basic Metabolic Panel: Recent Labs  Lab 05/24/20 1332 05/27/20 1640 05/29/20 1335  NA 130* 134* 133*  K 4.6 4.6 4.4  CL 92* 95* 97*  CO2 22 25 21*  GLUCOSE 204* 191* 180*  BUN 55* 62* 47*  CREATININE 7.16* 8.08* 7.32*  CALCIUM 10.1 10.2 10.5*  PHOS 3.8 3.0 3.0    CBC: Recent Labs  Lab 05/24/20 1332 05/27/20 1640 05/29/20 1334  WBC 13.6* 10.3 10.2  HGB 7.1* 7.0*  9.0*  HCT 22.3* 22.3* 27.2*  MCV 86.8 88.5 87.2  PLT 327 366 380    CBG: Recent Labs  Lab 05/29/20 1222 05/29/20 1707  05/29/20 2110 05/30/20 0612 05/30/20 1253  GLUCAP 159* 211* 199* 151* 134*    Brief HPI:   Wanda Ramirez is a 49 y.o. female with history of T2DM, ESRD-HD, morbid obesity, chronic respiratory failure- 3 L oxygen dependent who was originally admitted on 02/13/20 with acute on chronic respiratory failure due to Covid 19 PNA. She was treated with Covid protocol but went on to develop ARDS with hypotension and ultimately required tracheostomy on 10/18. Hospital course significant for diarrhea with development of large state IV left buttock/sacral wound with exposed sacrum requiring A & D by Dr. Bobbye Morton on 11/02.  Wound cultures positive for ESBL E coli and was treated with meropenum. She tolerated extubation followed by capping trials and was decannulation prior to discharge to CIR. Issues with delirium due to metabolic encephalopathy had resolved, hypotension was being managed with midodrine and anemia of chronic disease/illness managed with aranesp. Her respiratory status was stable and she was showing improvement in activity tolerance. CIR was recommended due to physical debility for prolonged hospitalization with functional decline. Marland Kitchen    Hospital Course: Wanda Ramirez was admitted to rehab 05/15/2020 for inpatient therapies to consist of PT  and OT at least three hours five days a week. Past admission physiatrist, therapy team and rehab RN have worked together to provide customized collaborative inpatient rehab. She was maintained on Juven, nutritional supplements and multiple vitamins to help promote wound healing. Santyl was used to eschar on inferior flap with wet dry dry dressing bid and prn soilage. Air mattress was used for pressure relief measures. Sacral wound bed is clean with beefy red granulation tissue and santyl is being use on residual eschar.  Serial check of CBC showed that leucocytosis has resolved and H/H is slowly improving. Sacral pain was managed with prn use of oxycodone and this  was taper to 5 mg prior to discharge.   She continues on midodrine due to orthostatic changes but is asymptomatic and continues on midodrine tid for BP support. HD has been ongoing at the end of the day on TTS to help with activity tolerance. Her Left IJ catheter was changed out by Dr. Earleen Newport on 12/29 due to malfunction with slow flow despite thrombolytics.  Her diabetes has been managed with low dose Lantus once a day and novolog for meal coverage. She was advised to monitor BS ac/hs and follow up with PCP for further titration after discharge. Her respiratory status is stable she has been weaned to RA. She continues to have DOE with activity and has been educated on energy conservation measures. She has made good gains during her rehab stay and currently requires supervision at wheelchair level. She will continue to receive follow up [place] [place] [facility] [place] by  [place] after discharge.    Rehab course: During patient's stay in rehab weekly team conferences were held to monitor patient's progress, set goals and discuss barriers to discharge. At admission, patient required min to mod assist with ADLs and min assist with mobility. She has had improvement in activity tolerance, balance, postural control as well as ability to compensate for deficits. She is able to complete ADL tasks with supervision. She requires supervision for transfers and for ambulation. Family education was completed.   Disposition: home   Diet: Carb modified/Renal w/ 1200 cc FR.  Special Instructions: 1. Apply santyl to fibronecrotic eschar, cover with damp to dry dressing and change daily. Change more frequently if soiled. 2. Monitor BS ac/hs and record.  3. Continue pressure relief measures. Side lie when in bed.    Allergies as of 05/30/2020      Reactions   Hydrocodone Itching, Nausea And Vomiting      Medication List    STOP taking these medications   acetaminophen 160 MG/5ML solution Commonly known as:  TYLENOL Replaced by: acetaminophen 325 MG tablet   albuterol (2.5 MG/3ML) 0.083% nebulizer solution Commonly known as: PROVENTIL   aspirin EC 81 MG tablet   chlorhexidine gluconate (MEDLINE KIT) 0.12 % solution Commonly known as: PERIDEX   Chlorhexidine Gluconate Cloth 2 % Pads   clonazePAM 0.25 MG disintegrating tablet Commonly known as: KLONOPIN   Darbepoetin Alfa 150 MCG/0.3ML Sosy injection Commonly known as: ARANESP   heparin 5000 UNIT/ML injection   insulin aspart 100 UNIT/ML injection Commonly known as: novoLOG   insulin glargine 100 UNIT/ML injection Commonly known as: LANTUS   ondansetron 4 MG/2ML Soln injection Commonly known as: ZOFRAN   sevelamer carbonate 0.8 g Pack packet Commonly known as: RENVELA Replaced by: sevelamer carbonate 800 MG tablet   silver nitrate applicators 06-30 % applicator     TAKE these medications   acetaminophen 325 MG tablet Commonly known as: TYLENOL Take 1-2 tablets (325-650 mg total) by mouth every 4 (four) hours as needed for mild pain. Replaces: acetaminophen 160 MG/5ML solution   B-complex with vitamin C tablet Take 1 tablet by mouth daily.   brimonidine 0.2 % ophthalmic solution Commonly known as: ALPHAGAN Place 1 drop into both eyes 3 (three) times daily.   brinzolamide 1 % ophthalmic suspension Commonly known as: AZOPT Place 1 drop into both eyes 3 (three) times daily.   camphor-menthol lotion Commonly known as: SARNA Apply topically as needed for itching.   collagenase ointment Commonly known as: SANTYL Apply topically daily. Apply to yellow slough and cover with damp to dry dressing. Change daily What changed: additional instructions   hydrocerin Crea Apply 1 application topically daily.   latanoprost 0.005 % ophthalmic solution Commonly known as: XALATAN Place 1 drop into both eyes in the morning.   levothyroxine 125 MCG tablet Commonly known as: SYNTHROID Take 1 tablet (125 mcg total) by mouth  daily before breakfast.   lip balm ointment Apply topically as needed for lip care.   midodrine 10 MG tablet Commonly known as: PROAMATINE Take 1 tablet (10 mg total) by mouth 3 (three) times daily with meals.   multivitamin Tabs tablet Take 1 tablet by mouth at bedtime.   nutrition supplement (JUVEN) Pack Take 1 packet by mouth 2 (two) times daily between meals. What changed: Another medication with the same name was removed. Continue taking this medication, and follow the directions you see here.   oxyCODONE 5 MG immediate release tablet--Rx# 28 pills Commonly known as: Oxy IR/ROXICODONE Take 1 tablet (5 mg total) by mouth every 6 (six) hours as needed for severe pain.   pantoprazole 40 MG tablet Commonly known as: PROTONIX Take 1 tablet (40 mg total) by mouth daily.   pentafluoroprop-tetrafluoroeth Aero Commonly known as: GEBAUERS Apply 1 application topically as needed (topical anesthesia for hemodialysis).   polyethylene glycol 17 g packet Commonly known as: MIRALAX / GLYCOLAX Take 17 g by mouth daily as needed for mild constipation.   QUEtiapine 50 MG tablet Commonly known as: SEROQUEL Take 1 tablet (50 mg  total) by mouth at bedtime.   sevelamer carbonate 800 MG tablet Commonly known as: RENVELA Take 2 tablets (1,600 mg total) by mouth 3 (three) times daily with meals. Replaces: sevelamer carbonate 0.8 g Pack packet       Follow-up Information    Kirsteins, Luanna Salk, MD Follow up.   Specialty: Physical Medicine and Rehabilitation Why: Office will call you with follow up appointment Contact information: [place] [address] [phone]        Welford Roche, NP. Call on 05/31/2020.   Specialty: Gerontology Why: for post hospital follow up Contact information: [place] [address] [phone]        Greer Pickerel, MD. Call on 05/31/2020.   Specialty: General Surgery Why: for follow up on  sacral ulcer.  Contact information: [place] [address] [phone]               Signed: Bary Leriche 05/31/2020, 12:06 PM

## 2020-05-29 NOTE — Progress Notes (Signed)
Occupational Therapy Session Note  Patient Details  Name: Wanda Ramirez MRN: [id number] Date of Birth: Oct 06, 1970  Today's Date: 05/29/2020 OT Individual Time: 1100-1203 OT Individual Time Calculation (min): 63 min    Short Term Goals: Week 1:  OT Short Term Goal 1 (Week 1): Pt will don BLE into pants with min A. OT Short Term Goal 1 - Progress (Week 1): Met OT Short Term Goal 2 (Week 1): Pt will complete LB bathing with min A. OT Short Term Goal 2 - Progress (Week 1): Met OT Short Term Goal 3 (Week 1): Pt will complete sit<>stand in prep for standing ADLs with close supervision. OT Short Term Goal 3 - Progress (Week 1): Progressing toward goal  Skilled Therapeutic Interventions/Progress Updates:    Pt received side lying in bed, agreeable to therapy. C/o of slight sacral pain. Session focus on dressing, activity endurance, and toileting. Pt completes bed mobility with close S. Doffed hospital gown with close S sitting EOB, donned pants + shirt with close S 2/2 limited activity tolerance. Stand-pivot transfer to w/c with RW + close S. Cleansed w/c + RW with purple wipe, pt washed hands at sink in w/c with ind. Transported to gym 2/2 time, energy conservation. Completed 3 rounds of BITS game to target improved activity tolerance while standing with close S + RW, results as follows :  Game: Memory Accuracy: 60% Duration Time: 2 min Accuracy: 84.62% Duration Time: 2 min Accuracy: NA Duration Time: 1.5 min  Pt with noted labored breathing throughout task, req seated rest break between bouts, rated exertion at 8/10 then 7/10 on RPE scale.   Reviewed energy conservation strategies for ADL tasks + BUE HEP per resource in health binder. Pt completed the following seated: 10 reps of BUE elbow flexion, shoulder flexion with 1 lb dumbbells, shoulder shrugs, shoulder pro/retraction/elevation/depression.  Transported back to room, stand-pivot to toilet with CGA  + R grab bar, cont void of  bladder. Performed standing anterior peri care with CGA + R grab bar, then clothing management with min A to pull pants over bulky sacral dressings. Washed hands seated in w/c at sink with ind. Stand-pivot back to bed, bed mobility with close S.   Pt left side lying in bed, bed alarm engaged, call bell in reach, all immediate needs met.    Therapy Documentation Precautions:  Precautions Precautions: Fall,Other (comment) Precaution Comments: Sacral wound Restrictions Weight Bearing Restrictions: No  Pain: Pain Assessment Pain Scale: Faces Pain Score: 0-No pain Faces Pain Scale: Hurts a little bit Pain Type: Acute pain Pain Location: Buttocks ADL: See Care Tool for more details.  Therapy/Group: Individual Therapy  Volanda Napoleon, MS, OTR/L  05/29/2020, 12:07 PM

## 2020-05-29 NOTE — Progress Notes (Signed)
[place] PHYSICAL MEDICINE & REHABILITATION PROGRESS NOTE   Subjective/Complaints:  Sacral pain improving takes oxyIR 10mg  BID Pt very upbeat today , daughters in for family training   ROS- Pt denies SOB, abd pain, CP, N/V/C/D, and vision changes, + pain at pressure injury site.    Objective:   No results found. Recent Labs    05/27/20 1640  WBC 10.3  HGB 7.0*  HCT 22.3*  PLT 366   Recent Labs    05/27/20 1640  NA 134*  K 4.6  CL 95*  CO2 25  GLUCOSE 191*  BUN 62*  CREATININE 8.08*  CALCIUM 10.2    Intake/Output Summary (Last 24 hours) at 05/29/2020 0809 Last data filed at 05/29/2020 0741 Gross per 24 hour  Intake 600 ml  Output --  Net 600 ml     Pressure Injury 04/03/20 Left Stage 4 - Full thickness tissue loss with exposed bone, tendon or muscle. Updated as of 05/03/20:Wound type: surgically debrided Stage 4 Pressure Injury  Measurement: 17cm x 15cm x 4cm with tunneling at 6 oclock that is 9 cm  (Active)  04/03/20   Location:   Location Orientation: Left  Staging: Stage 4 - Full thickness tissue loss with exposed bone, tendon or muscle.  Wound Description (Comments): Updated as of 05/03/20:Wound type: surgically debrided Stage 4 Pressure Injury  Measurement: 17cm x 15cm x 4cm with tunneling at 6 oclock that is 9 cm deep and tunneling at 2 oclock that is 8cm  Present on Admission: Yes    Physical Exam: Vital Signs Blood pressure (!) 153/97, pulse 85, temperature 98.7 F (37.1 C), temperature source Oral, resp. rate 17, height 5\' 2"  (1.575 m), weight 96.2 kg, SpO2 98 %. HEENT: healed trach   General: No acute distress Mood and affect are appropriate Heart: Regular rate and rhythm no rubs murmurs or extra sounds Lungs: Clear to auscultation, breathing unlabored, no rales or wheezes Abdomen: Positive bowel sounds, soft nontender to palpation, nondistended Extremities: No clubbing, cyanosis, or edema      05/23/2020 Neurologic: Cranial nerves II  through XII intact, motor strength is 4/5 in bilateral deltoid, bicep, tricep, grip, hip flexor, knee extensors, ankle dorsiflexor and plantar flexor Sensory exam able to sense LT but reports numbness RIght little finger and Left toes Cerebellar exam normal finger to nose to finger as well as heel to shin in bilateral upper and lower extremities Musculoskeletal: Full range of motion in all 4 extremities. No joint swelling  Assessment/Plan: 1. Functional deficits which require 3+ hours per day of interdisciplinary therapy in a comprehensive inpatient rehab setting.  Physiatrist is providing close team supervision and 24 hour management of active medical problems listed below.  Physiatrist and rehab team continue to assess barriers to discharge/monitor patient progress toward functional and medical goals  Care Tool:  Bathing    Body parts bathed by patient: Face,Left upper leg,Left arm,Right arm,Chest,Front perineal area,Abdomen,Right upper leg,Right lower leg   Body parts bathed by helper: Buttocks Body parts n/a: Buttocks   Bathing assist Assist Level: Supervision/Verbal cueing     Upper Body Dressing/Undressing Upper body dressing   What is the patient wearing?: Hospital gown only    Upper body assist Assist Level: Supervision/Verbal cueing    Lower Body Dressing/Undressing Lower body dressing      What is the patient wearing?: Incontinence brief     Lower body assist Assist for lower body dressing: Moderate Assistance - Patient 50 - 74%     Toileting  Toileting    Toileting assist Assist for toileting: Total Assistance - Patient < 25% (total A for posterior hygiene 2/2 sacral wounds)     Transfers Chair/bed transfer  Transfers assist     Chair/bed transfer assist level: Supervision/Verbal cueing Chair/bed transfer assistive device: Programmer, multimedia   Ambulation assist      Assist level: Supervision/Verbal cueing Assistive device:  Walker-rolling Max distance: ~171ft   Walk 10 feet activity   Assist     Assist level: Supervision/Verbal cueing Assistive device: Walker-rolling   Walk 50 feet activity   Assist Walk 50 feet with 2 turns activity did not occur: Safety/medical concerns  Assist level: Supervision/Verbal cueing Assistive device: Walker-rolling    Walk 150 feet activity   Assist Walk 150 feet activity did not occur: Safety/medical concerns         Walk 10 feet on uneven surface  activity   Assist Walk 10 feet on uneven surfaces activity did not occur: Safety/medical concerns         Wheelchair     Assist Will patient use wheelchair at discharge?: Yes Type of Wheelchair: Manual    Wheelchair assist level: Supervision/Verbal cueing Max wheelchair distance: 119ft    Wheelchair 50 feet with 2 turns activity    Assist        Assist Level: Supervision/Verbal cueing   Wheelchair 150 feet activity     Assist      Assist Level: Moderate Assistance - Patient 50 - 74%   Blood pressure (!) 153/97, pulse 85, temperature 98.7 F (37.1 C), temperature source Oral, resp. rate 17, height 5\' 2"  (1.575 m), weight 96.2 kg, SpO2 98 %.    Medical Problem List and Plan: 1.  Functional and mobility deficits secondary to debility after COVID and associated complications including VDRF/trach and large sacral wound Has neuropathy, mononeuropathy multiplex pattern, no sig neurogenic pain             -patient may not yet shower             -ELOS/Goals: 12/29  Continue CIR PT, OT, team conf in am  .2.  Antithrombotics: -DVT/anticoagulation:  Pharmaceutical: Heparin             -antiplatelet therapy: N/A 3. Pain Management: Continue Oxycodone prn.  Will reduce Oxy IR to 5mg   4. Mood: LCSW to follow for evaluation and support.              -antipsychotic agents: N/A 5. Neuropsych: This patient is capable of making decisions on her own behalf--dicussed code status -->SHE WANTS  TO BE FULL CODE . 6.Large stage IV sacral/buttock wound/Wound Care: Meropenum completed 05/13/20.     -Juven tid, glucerna tid and vitamin B w/C.               -Santyl to eschar on inferior flap and wet to dry dressing changes bid and prn to keep wound clean of stool to prevent contamination.              -Pressure relief measure with air mattress.                -WBC trending upwards--recheck in am   12/18- WBC down to 10.8 from 14k- will monitor 7. Fluids/Electrolytes/Nutrition: Renal diet with 1200 cc FR. Strict I/O with daily weights.              -check labs with HD 8.T2DM: On glucerna tid with meals. Monitor BS ac/hs--On Lantus 3  units daily with SSI for elevated BS.  CBG (last 3)  Recent Labs    05/28/20 1632 05/28/20 2058 05/29/20 0557  GLUCAP 170* 173* 138*  Some lability we will continue to monitor.overall good in hospital control   9. Hypotension: Monitor BP tid--especially with increase in activity. Continue Midodrine 10 mg tid.  Vitals:   05/28/20 1428 05/28/20 2102  BP: 135/87 (!) 153/97  Pulse: 92 85  Resp: 18 17  Temp: 98.7 F (37.1 C) 98.7 F (37.1 C)  SpO2: 98% 98%  Controlled on Midodrine 10mg  TID, 05/29/2020- trending up may need to reduce midodrine to 5mg  but will check orthostatic vitals first  Hypthyroid: Stable on supplement.  10. ESRD:  HD -TTS with nephrology to assist with management. Schedule HD at the end of the day to help with tolerance of activity. Sevelmar tid for metabolic bone disease. Hyperkalemia managed with HD.  Per Nephro 11. Anemia of critical illness/anemia of CKD: On Aranesp 150 mc/week. Hgb stable at 7.2 range past week.  Transfuse prn Hgb<7.0.    12/26: Hgb down to 7: continue to monitor with HD.  12.  Anxiety: Continue low dose klonopin in am and Seroquel at nights to help with sleep.  13. Leukocytosis.: afebrile recent UA C and S neg ,sacral wound clean  no resp sx, monitor   WBC 10.3 on 12/26- monitor with HD   LOS: 14 days A  FACE TO [place] E Jamareon Shimel 05/29/2020, 8:09 AM

## 2020-05-29 NOTE — Progress Notes (Signed)
Patient's daughters educated and demonstrated on proper wound care and that dressing needs to be changed twice daily or more if soiled. Pt's family states they have no further questions at this time and have been instructed to call up to the nurses station if they have any questions. Social worker also came in to ask if family had any questions regarding discharge. Pt in bed with alarms on and call light within reach.

## 2020-05-29 NOTE — Progress Notes (Signed)
Occupational Therapy Discharge Summary  Patient Details  Name: Wanda Ramirez MRN: [id number] Date of Birth: 10-Dec-1970  Today's Date: 05/29/2020 OT Individual Time: 4782-9562 OT Individual Time Calculation (min): 54 min    Patient has met 7 of 7 long term goals due to improved activity tolerance, improved balance, postural control, ability to compensate for deficits and improved coordination.  Patient to discharge at [facility] Supervision - [place] level.  Patient's care partner is independent to provide the necessary physical assistance at discharge.  The pt has made consistent progress with OT services, and is now occasional Min A with LB bathe and toileting as she needs assist for thoroughly cleaning buttocks after BM, but able to perform pericare without assist. Pt is performing ADL transfers with Supervision with use of RW. Family ed completed with daughters on 12/28 and verbalized understanding of level of supervision needed during ADL for safety. Note pt is not cleared to shower yet d/t port and wounds, family and pt aware that she is to perform sponge baths at home until cleared.   Reasons goals not met: NA  Recommendation:  Patient will benefit from ongoing skilled OT services in home health setting to continue to advance functional skills in the area of BADL.  Equipment: BSC, TTB (for when pt is cleared to shower)  Reasons for discharge: treatment goals met and discharge from hospital  Patient/family agrees with progress made and goals achieved: Yes   Skilled Interventions: Pt greeted at time of session sitting EOB with daughters present, RN finishing med pass. Explained role and purpose of OT with family having questions about wound care, spoke with RN who is coming after family ed to train on wound care and dressing changes. Pt ambulated EOB > bathroom Supervision with RW, performed transfer in same manner to toilet and performed her own clothing management but daughter  assisted with hygiene after BM. Donned new scrub pants with Supervision, daughters providing assist as needed. Rest breaks throughout toilet and dressing session with seated breaks and O2 sats checked, remained WNL and cues for pursed lip breathing when needed. Transported via wheelchair to ADL shower room and therapist demonstrated TTB transfer per daughters request as they want to assist the pt with bathing on TTB for sponge baths but are very clear that she is not cleared to shower d/t chest port and wounds. Daughters verbalized understanding for assist needed at home with ADLs after session. Pt transported back to room dependent for time management, set up in wheelchair with alarm on, call bell in reach.    OT Discharge Precautions/Restrictions  Precautions Precautions: Fall;Other (comment) (sacral wound) Precaution Comments: Sacral wound Restrictions Weight Bearing Restrictions: No Pain Pain Assessment Pain Scale: 0-10 Pain Score: 0-No pain ADL ADL Eating: Set up Where Assessed-Eating: Chair Grooming: Setup Where Assessed-Grooming: Edge of bed Upper Body Bathing: Supervision/safety Where Assessed-Upper Body Bathing: Sitting at sink Lower Body Bathing: Minimal assistance Where Assessed-Lower Body Bathing: Standing at sink,Sitting at sink Upper Body Dressing: Supervision/safety Where Assessed-Upper Body Dressing: Edge of bed Lower Body Dressing: Minimal assistance Where Assessed-Lower Body Dressing: Edge of bed Toileting: Minimal assistance Where Assessed-Toileting: Bedside Commode Toilet Transfer: Close supervision Toilet Transfer Method: Counselling psychologist: Extra wide Radiographer, therapeutic: Not assessed Social research officer, government: Not assessed Vision Baseline Vision/History: Wears glasses Wears Glasses: Distance only Perception  Perception: Within Functional Limits Praxis Praxis: Intact Cognition Overall Cognitive Status: Within Functional  Limits for tasks assessed Arousal/Alertness: Awake/alert Orientation Level: Oriented X4 Awareness: Appears  intact Problem Solving: Appears intact Safety/Judgment: Appears intact Sensation Sensation Light Touch: Appears Intact Proprioception: Appears Intact Coordination Gross Motor Movements are Fluid and Coordinated: Yes Fine Motor Movements are Fluid and Coordinated: Yes (delayed R thumb but functional for ADL) Motor  Motor Motor: Within Functional Limits Mobility  Bed Mobility Bed Mobility: Supine to Sit;Sit to Supine Rolling Right: Independent with assistive device Rolling Left: Independent with assistive device Supine to Sit: Independent with assistive device Sit to Supine: Independent with assistive device Transfers Sit to Stand: Independent with assistive device Stand to Sit: Supervision/Verbal cueing  Trunk/Postural Assessment  Cervical Assessment Cervical Assessment: Exceptions to [facility] Thoracic Assessment Thoracic Assessment: Exceptions to [facility] Lumbar Assessment Lumbar Assessment: Within Functional Limits Postural Control Postural Control: Deficits on evaluation  Balance Balance Balance Assessed: Yes Static Sitting Balance Static Sitting - Balance Support: Feet supported Static Sitting - Level of Assistance: 6: Modified independent (Device/Increase time) Dynamic Sitting Balance Dynamic Sitting - Balance Support: Feet supported;During functional activity Dynamic Sitting - Level of Assistance: 6: Modified independent (Device/Increase time) Dynamic Sitting - Balance Activities: Lateral lean/weight shifting;Reaching for objects;Forward lean/weight shifting;Reaching across midline Sitting balance - Comments: dynamic sitting during toileting and LB dress Static Standing Balance Static Standing - Balance Support: Bilateral upper extremity supported Static Standing - Level of Assistance: 6: Modified independent (Device/Increase time) Dynamic Standing Balance Dynamic  Standing - Balance Support: Left upper extremity supported;During functional activity Dynamic Standing - Level of Assistance: 5: Stand by assistance Extremity/Trunk Assessment RUE Assessment RUE Assessment: Within Functional Limits General Strength Comments: 4 to 4+/5 roughly, weakness present but improved from eval LUE Assessment LUE Assessment: Within Functional Limits General Strength Comments: 4 to 4+/5 roughly, weakness present but improved from eval   Viona Gilmore 05/29/2020, 8:40 AM

## 2020-05-29 NOTE — Progress Notes (Signed)
Renal Quick Note:  Dialysis RN reports that tunneled dialysis cath running very poorly today. Had reversed flow and had patietn change positions without improvement. Paused HD and did 30 min tPA dwell to B lumens without much improvement.  Will request TDC exchange with IR.  Veneta Penton, PA-C [employer] Pager [phone]

## 2020-05-29 NOTE — Progress Notes (Signed)
Patient ID: Wanda Ramirez, female   DOB: 03/06/1971, 49 y.o.   MRN: [id number]   Patient declined by [facility] due to payer.  [place], [place]

## 2020-05-29 NOTE — Progress Notes (Signed)
Unable to run pt. At ordered BFR 400. Currently at BFR 275 with AP -260/-270. Veneta Penton, PA aware. Orders to cathflo HD catheter x30 mins then reattempt. Tx. Paused. Pt. stable

## 2020-05-29 NOTE — Progress Notes (Signed)
Patient ID: Wanda Ramirez, female   DOB: March 01, 1971, 49 y.o.   MRN: [id number]  TTB ordered through [employer].

## 2020-05-29 NOTE — Procedures (Signed)
   I was present at this dialysis session, have reviewed the session itself and made  appropriate changes Kelly Splinter MD [place] pager [phone]   05/29/2020, 2:24 PM

## 2020-05-30 ENCOUNTER — Telehealth: Payer: Self-pay

## 2020-05-30 ENCOUNTER — Inpatient Hospital Stay (HOSPITAL_COMMUNITY): Payer: Medicaid Other

## 2020-05-30 DIAGNOSIS — U099 Post covid-19 condition, unspecified: Secondary | ICD-10-CM | POA: Insufficient documentation

## 2020-05-30 HISTORY — PX: IR FLUORO GUIDE CV LINE LEFT: IMG2282

## 2020-05-30 LAB — GLUCOSE, CAPILLARY
Glucose-Capillary: 151 mg/dL — ABNORMAL HIGH (ref 70–99)
Glucose-Capillary: 134 mg/dL — ABNORMAL HIGH (ref 70–99)

## 2020-05-30 MED ORDER — INSULIN GLARGINE 100 UNITS/ML SOLOSTAR PEN: 4.0000 [IU] | PEN_INJECTOR | Freq: Every day | SUBCUTANEOUS | 0 refills | Status: DC

## 2020-05-30 MED ORDER — CHLORHEXIDINE GLUCONATE 4 % EX LIQD
CUTANEOUS | Status: AC
  Filled 2020-05-30: qty 15

## 2020-05-30 MED ORDER — HEPARIN SODIUM (PORCINE) 1000 UNIT/ML IJ SOLN
INTRAMUSCULAR | Status: AC
  Administered 2020-05-30: 11:00:00 4200 [IU]
  Filled 2020-05-30: qty 1

## 2020-05-30 MED ORDER — LIDOCAINE HCL 1 % IJ SOLN
INTRAMUSCULAR | Status: AC
  Filled 2020-05-30: qty 20

## 2020-05-30 MED ORDER — INSULIN ASPART 100 UNIT/ML FLEXPEN: PEN_INJECTOR | SUBCUTANEOUS | 0 refills | Status: DC

## 2020-05-30 MED ORDER — LIDOCAINE HCL (PF) 1 % IJ SOLN
INTRAMUSCULAR | Status: AC | PRN
Start: 2020-05-30 — End: 2020-05-30
  Administered 2020-05-30: 10 mL

## 2020-05-30 MED ORDER — CLONAZEPAM 0.5 MG PO TABS: 0.2500 mg | ORAL_TABLET | Freq: Every day | ORAL | 0 refills | Status: DC

## 2020-05-30 MED ORDER — CLONAZEPAM 0.5 MG PO TABS: ORAL_TABLET | ORAL | 0 refills | Status: DC

## 2020-05-30 NOTE — Progress Notes (Signed)
Patient off unit to IR

## 2020-05-30 NOTE — Progress Notes (Signed)
Discharge instructions given to patient and daughter. Questions answered by PA. Pt noted leaving unit via wheelchair escorted by daughter and sister. NADN.

## 2020-05-30 NOTE — Progress Notes (Signed)
Patient ID: Wanda Ramirez, female   DOB: [birth year], 49 y.o.   MRN: [id number]   SW informed patient Medicaid will expire on 12/31. Patient has not renewed, per patient. Patient will work on Insurance risk surveyor. [facility] will still see patient.  [place], [place]

## 2020-05-30 NOTE — Consult Note (Signed)
Patient Status: [facility] - In-pt  Assessment and Plan: ESRD on HD via left IJ tunneled HD catheter, with malfunction of HD catheter despite cathflo administration. Plan for image-guided tunneled HD catheter exchange/replacement today in IR. No sedation. Afebrile and WBCs WNL.  Risks and benefits discussed with the patient including, but not limited to bleeding, infection, vascular injury, pneumothorax which may require chest tube placement, air embolism or even death. All of the patient's questions were answered, patient is agreeable to proceed. Consent signed and in IR control room.   ______________________________________________________________________   History of Present Illness: Wanda Ramirez is a 49 y.o. female with a past medical history of hypertension, hyperlipidemia, asthma, GERD, ESRD on HD, diabetes mellitus with associated peripheral neuropathy, hypothyroidism, glaucoma, chronic back pain, arthritis, morbid obesity, and OSA on CPAP. She is known to IR- she underwent an image-guided left IJ tunneled HD catheter placement 02/22/2020, most recently exchanged in IR 02/27/2020. She has been admitted to CIR since 05/15/2020 for management of functional deficits due to debility. She underwent HD yesterday via left IJ tunneled HD catheter and had issues with catheter flow despite cathflo administration.  IR consulted by Stephania Fragmin, PA-C for possible image-guided tunneled HD catheter exchange/replacement. Patient awake and alert laying in bed with no complaints at this time. Denies fever, chills, chest pain, dyspnea, abdominal pain, or headache.   Allergies and medications reviewed.   Review of Systems: A 12 point ROS discussed and pertinent positives are indicated in the HPI above.  All other systems are negative.  Review of Systems  Constitutional: Negative for chills and fever.  Respiratory: Negative for shortness of breath and wheezing.   Cardiovascular: Negative for  chest pain and palpitations.  Gastrointestinal: Negative for abdominal pain.  Neurological: Negative for headaches.  Psychiatric/Behavioral: Negative for behavioral problems and confusion.    Vital Signs: BP 125/89 (BP Location: Right Arm)   Pulse 93   Temp 99.1 F (37.3 C) (Oral)   Resp 19   Ht 5\' 2"  (1.575 m)   Wt 207 lb (93.9 kg)   SpO2 100%   BMI 37.86 kg/m   Physical Exam Vitals and nursing note reviewed.  Constitutional:      General: She is not in acute distress.    Appearance: Normal appearance.  Cardiovascular:     Rate and Rhythm: Normal rate and regular rhythm.     Heart sounds: Normal heart sounds. No murmur heard.   Pulmonary:     Effort: Pulmonary effort is normal. No respiratory distress.     Breath sounds: Normal breath sounds. No wheezing.  Skin:    General: Skin is warm and dry.     Comments: (+) left IJ tunneled HD catheter, site without tenderness, erythema, drainage, or active bleeding.  Neurological:     Mental Status: She is alert and oriented to person, place, and time.      Imaging reviewed.   Labs:  COAGS: Recent Labs    03/03/20 1700 03/04/20 0356 03/04/20 1605 03/05/20 0450 04/02/20 1825 04/03/20 0245 04/03/20 1023 04/09/20 1025 04/16/20 1730  INR  --   --   --   --    < > 1.4* 1.5* 1.2 1.0  APTT 73* 52* 62* 47*  --   --   --   --   --    < > = values in this interval not displayed.    BMP: Recent Labs    03/04/20 0356 03/04/20 0927 03/04/20 1605 03/05/20 0450 03/05/20 1703  03/06/20 1713 05/22/20 1232 05/24/20 1332 05/27/20 1640 05/29/20 1335  NA 136   < > 136 135 134*   < > 132* 130* 134* 133*  K 3.8   < > 3.6 4.0 4.6   < > 4.5 4.6 4.6 4.4  CL 101  --  103 102 102   < > 93* 92* 95* 97*  CO2 23  --  25 24 22    < > 23 22 25  21*  GLUCOSE 160*  --  223* 203* 258*   < > 166* 204* 191* 180*  BUN 29*  --  28* 27* 31*   < > 60* 55* 62* 47*  CALCIUM 8.6*  --  8.5* 8.9 8.6*   < > 9.9 10.1 10.2 10.5*  CREATININE 1.90*   --  2.00* 1.82* 2.00*   < > 7.40* 7.16* 8.08* 7.32*  GFRNONAA 30*  --  29* 32* 29*   < > 6* 7* 6* 6*  GFRAA 35*  --  33* 37* 33*  --   --   --   --   --    < > = values in this interval not displayed.       Electronically Signed: Earley Abide, PA-C 05/30/2020, 9:36 AM   I spent a total of 15 minutes in face to face in clinical consultation, greater than 50% of which was counseling/coordinating care for HD access.

## 2020-05-30 NOTE — Telephone Encounter (Signed)
[name] from [facility] called stating Seroquel needs PA Insulin written for vials and it will get wasted so can she get pens instead prescription for syringes and needles needs to be sent Clonazapam-can they give 0.5mg  and take 1/2 tablet daily instead of what was sent because they don't stock it Santyl-need to know size of wound. Wanda Ramirez

## 2020-05-30 NOTE — Progress Notes (Signed)
[facility] PHYSICAL MEDICINE & REHABILITATION PROGRESS NOTE   Subjective/Complaints:  Scheduled for D/C today , appreciate Nephro nurse and SW notes  Supposed to have catheter exchange in IR today prior to d/c  ROS- Pt denies SOB, abd pain, CP, N/V/C/D, and vision changes, + pain at pressure injury site.    Objective:   No results found. Recent Labs    05/27/20 1640 05/29/20 1334  WBC 10.3 10.2  HGB 7.0* 9.0*  HCT 22.3* 27.2*  PLT 366 380   Recent Labs    05/27/20 1640 05/29/20 1335  NA 134* 133*  K 4.6 4.4  CL 95* 97*  CO2 25 21*  GLUCOSE 191* 180*  BUN 62* 47*  CREATININE 8.08* 7.32*  CALCIUM 10.2 10.5*    Intake/Output Summary (Last 24 hours) at 05/30/2020 0802 Last data filed at 05/29/2020 1620 Gross per 24 hour  Intake 220 ml  Output 1027 ml  Net -807 ml     Pressure Injury 04/03/20 Left Stage 4 - Full thickness tissue loss with exposed bone, tendon or muscle. Updated as of 05/03/20:Wound type: surgically debrided Stage 4 Pressure Injury  Measurement: 17cm x 15cm x 4cm with tunneling at 6 oclock that is 9 cm  (Active)  04/03/20   Location:   Location Orientation: Left  Staging: Stage 4 - Full thickness tissue loss with exposed bone, tendon or muscle.  Wound Description (Comments): Updated as of 05/03/20:Wound type: surgically debrided Stage 4 Pressure Injury  Measurement: 17cm x 15cm x 4cm with tunneling at 6 oclock that is 9 cm deep and tunneling at 2 oclock that is 8cm  Present on Admission: Yes    Physical Exam: Vital Signs Blood pressure 125/89, pulse 93, temperature 99.1 F (37.3 C), temperature source Oral, resp. rate 19, height 5\' 2"  (1.575 m), weight 93.9 kg, SpO2 100 %. HEENT: healed trach    General: No acute distress Mood and affect are appropriate Heart: Regular rate and rhythm no rubs murmurs or extra sounds Lungs: Clear to auscultation, breathing unlabored, no rales or wheezes Abdomen: Positive bowel sounds, soft nontender to  palpation, nondistended Extremities: No clubbing, cyanosis, or edema Skin: No evidence of breakdown, no evidence of rash        05/23/2020   05/30/20  Neurologic: Cranial nerves II through XII intact, motor strength is 4/5 in bilateral deltoid, bicep, tricep, grip, hip flexor, knee extensors, ankle dorsiflexor and plantar flexor Sensory exam able to sense LT but reports numbness RIght little finger and Left toes Cerebellar exam normal finger to nose to finger as well as heel to shin in bilateral upper and lower extremities   Assessment/Plan: 1. Functional deficitsdue to post Covid debility  Stable for D/C today F/u PCP in 3-4 weeks F/u PM&R 2 weeks See D/C summary See D/C instructions  Care Tool:  Bathing    Body parts bathed by patient: Face,Left upper leg,Left arm,Right arm,Chest,Front perineal area,Abdomen,Right upper leg,Right lower leg,Left lower leg   Body parts bathed by helper: Buttocks Body parts n/a: Buttocks   Bathing assist Assist Level: Minimal Assistance - Patient > 75%     Upper Body Dressing/Undressing Upper body dressing   What is the patient wearing?: Hospital gown only,Pull over shirt    Upper body assist Assist Level: Supervision/Verbal cueing    Lower Body Dressing/Undressing Lower body dressing      What is the patient wearing?: Pants     Lower body assist Assist for lower body dressing: Supervision/Verbal cueing  Toileting Toileting    Toileting assist Assist for toileting: Minimal Assistance - Patient > 75% (pull pants over bulky sacral dressing)     Transfers Chair/bed transfer  Transfers assist     Chair/bed transfer assist level: Supervision/Verbal cueing Chair/bed transfer assistive device: Programmer, multimedia   Ambulation assist      Assist level: Supervision/Verbal cueing Assistive device: Walker-rolling Max distance: ~169ft   Walk 10 feet activity   Assist     Assist level:  Supervision/Verbal cueing Assistive device: Walker-rolling   Walk 50 feet activity   Assist Walk 50 feet with 2 turns activity did not occur: Safety/medical concerns  Assist level: Supervision/Verbal cueing Assistive device: Walker-rolling    Walk 150 feet activity   Assist Walk 150 feet activity did not occur: Safety/medical concerns         Walk 10 feet on uneven surface  activity   Assist Walk 10 feet on uneven surfaces activity did not occur: Safety/medical concerns         Wheelchair     Assist Will patient use wheelchair at discharge?: Yes Type of Wheelchair: Manual    Wheelchair assist level: Supervision/Verbal cueing Max wheelchair distance: 160ft    Wheelchair 50 feet with 2 turns activity    Assist        Assist Level: Supervision/Verbal cueing   Wheelchair 150 feet activity     Assist      Assist Level: Moderate Assistance - Patient 50 - 74%   Blood pressure 125/89, pulse 93, temperature 99.1 F (37.3 C), temperature source Oral, resp. rate 19, height 5\' 2"  (1.575 m), weight 93.9 kg, SpO2 100 %.    Medical Problem List and Plan: 1.  Functional and mobility deficits secondary to debility after COVID and associated complications including VDRF/trach and large sacral wound Has neuropathy, mononeuropathy multiplex pattern, no sig neurogenic pain           D/C today  .2.  Antithrombotics: -DVT/anticoagulation:  Pharmaceutical: Heparin             -antiplatelet therapy: N/A 3. Pain Management: Continue Oxycodone prn.  Will reduce Oxy IR to 5mg  give one week supply post discharge , f/u wound care  4. Mood: LCSW to follow for evaluation and support.              -antipsychotic agents: N/A 5. Neuropsych: This patient is capable of making decisions on her own behalf--dicussed code status -->SHE WANTS TO BE FULL CODE . 6.Large stage IV sacral/buttock wound/Wound Care: Meropenum completed 05/13/20.     -Juven tid, glucerna tid and  vitamin B w/C.               -Santyl to eschar on inferior flap and wet to dry dressing changes bid and prn to keep wound clean of stool to prevent contamination.              -Pressure relief measure with air mattress.                -WBC trending upwards--recheck in am   12/18- WBC down to 10.8 from 14k- will monitor 7. Fluids/Electrolytes/Nutrition: Renal diet with 1200 cc FR. Strict I/O with daily weights.              -check labs with HD 8.T2DM: On glucerna tid with meals. Monitor BS ac/hs--On Lantus 3 units daily with SSI for elevated BS.  CBG (last 3)  Recent Labs    05/29/20 1707 05/29/20  2110 05/30/20 0612  GLUCAP 211* 199* 151*  f/u with PCP as OP    9. Hypotension: Monitor BP tid--especially with increase in activity. Continue Midodrine 10 mg tid.  Vitals:   05/29/20 1936 05/30/20 0509  BP: (!) 134/99 125/89  Pulse: 94 93  Resp: 18 19  Temp: 99.7 F (37.6 C) 99.1 F (37.3 C)  SpO2: 100% 100%  Nephro can manage as OP  Hypthyroid: Stable on supplement.  10. ESRD:  HD -TTS with nephrology to assist with management. Schedule HD at the end of the day to help with tolerance of activity. Sevelmar tid for metabolic bone disease. Hyperkalemia managed with HD.  Per Nephro  Tunneled catheter to be exchange in IR Prior to discharge  11. Anemia of critical illness/anemia of CKD: On Aranesp 150 mc/week. Hgb stable at 7.2 range past week.  Transfuse prn Hgb<7.0.    12/26: Hgb down to 7: continue to monitor with HD.  12.  Anxiety: Continue low dose klonopin in am and Seroquel at nights to help with sleep.  13. Leukocytosis.: afebrile recent UA C and S neg ,sacral wound clean  no resp sx, monitor   WBC 10.3 on 12/26- monitor with HD   LOS: 15 days A FACE TO FACE EVALUATION WAS PERFORMED  Charlett Blake 05/30/2020, 8:02 AM

## 2020-05-30 NOTE — Progress Notes (Signed)
Patient returned to unit. 

## 2020-05-30 NOTE — Progress Notes (Signed)
Inpatient Rehabilitation Care Coordinator Discharge Note  The overall goal for the admission was met for:   Discharge location: Yes, home   Length of Stay: Yes, 15 Days   Discharge activity level: Yes, ambulatory level Supervision  Home/community participation: Yes  Services provided included: MD, RD, PT, OT, SLP, RN, CM, TR, Pharmacy, Neuropsych and SW  Financial Services: Medicaid  Choices offered to/list presented to: Patient   Follow-up services arranged: Home Health: [place]   Comments (or additional information): Wheelchair, Conservation officer, nature, Tub Producer, television/film/video, Bedside Commode, Roho Cushion  Pt, [place], [place] for Wound Care   Patient/Family verbalized understanding of follow-up arrangements: Yes  Individual responsible for coordination of the follow-up plan: Tiona [phone]  Confirmed correct DME delivered: Dyanne Iha 05/30/2020    Dyanne Iha

## 2020-05-30 NOTE — Telephone Encounter (Signed)
I have spoken with Algis Liming PA.  Ms Incorvaia's lantus and novolog has been switched to flexpens  100 units/ml disp 15 ml each, same directions as previous order.  I have also given the wound dimensions to the pharmacy and changed the clonazepam to 0.5mg  tablet take 1/2 tablet daily disp 15 tablets 0 RF. A prior auth will be submitted to [facility] for her seroquel tablets.

## 2020-05-30 NOTE — Progress Notes (Signed)
[facility] Progress Note   Subjective:  Seen in room. Going down for [facility] exchange with IR shortly - greatly appreciate them getting her in so quickly! Plan remains to be discharged later today. No CP/dyspnea. She is in great spirits and ready to go home.    Objective Vitals:   05/29/20 1630 05/29/20 1712 05/29/20 1936 05/30/20 0509  BP: 135/84 (!) 145/93 (!) 134/99 125/89  Pulse:  95 94 93  Resp: (!) '25 19 18 19  ' Temp: 98.3 F (36.8 C) 98.4 F (36.9 C) 99.7 F (37.6 C) 99.1 F (37.3 C)  TempSrc: Oral Oral Oral Oral  SpO2: 98% 97% 100% 100%  Weight: 92.7 kg   93.9 kg  Height:       Physical Exam General: Well appearing, NAD. Room air. Heart: RRR; no murmur Lungs: CTA anteriorly Extremities: No LE edema Dialysis Access: [facility]  Additional Objective Labs: Basic Metabolic Panel: Recent Labs  Lab 05/24/20 1332 05/27/20 1640 05/29/20 1335  NA 130* 134* 133*  K 4.6 4.6 4.4  CL 92* 95* 97*  CO2 22 25 21*  GLUCOSE 204* 191* 180*  BUN 55* 62* 47*  CREATININE 7.16* 8.08* 7.32*  CALCIUM 10.1 10.2 10.5*  PHOS 3.8 3.0 3.0   Liver Function Tests: Recent Labs  Lab 05/24/20 1332 05/27/20 1640 05/29/20 1335  ALBUMIN 2.5* 2.5* 2.7*   CBC: Recent Labs  Lab 05/24/20 1332 05/27/20 1640 05/29/20 1334  WBC 13.6* 10.3 10.2  HGB 7.1* 7.0* 9.0*  HCT 22.3* 22.3* 27.2*  MCV 86.8 88.5 87.2  PLT 327 366 380   Medications: . sodium chloride    . sodium chloride     . sodium chloride   Intravenous Once  . B-complex with vitamin C  1 tablet Oral Daily  . brimonidine  1 drop Both Eyes TID  . brinzolamide  1 drop Both Eyes TID  . chlorhexidine      . chlorhexidine gluconate (MEDLINE KIT)  15 mL Mouth Rinse BID  . Chlorhexidine Gluconate Cloth  6 each Topical Daily  . Chlorhexidine Gluconate Cloth  6 each Topical Q0600  . clonazepam  0.25 mg Oral Daily  . collagenase   Topical Daily  . darbepoetin (ARANESP) injection - DIALYSIS  150 mcg Intravenous Q Thu-HD   . feeding supplement (GLUCERNA SHAKE)  237 mL Oral TID WC  . heparin  5,000 Units Subcutaneous Q8H  . hydrocerin   Topical Daily  . insulin aspart  0-6 Units Subcutaneous TID AC & HS  . insulin glargine  4 Units Subcutaneous QHS  . latanoprost  1 drop Both Eyes q AM  . levothyroxine  125 mcg Oral Q0600  . lidocaine      . midodrine  10 mg Oral TID WC  . multivitamin  1 tablet Oral QHS  . nutrition supplement (JUVEN)  1 packet Oral BID BM  . pantoprazole  40 mg Oral Daily  . QUEtiapine  50 mg Oral QHS  . sevelamer carbonate  1,600 mg Oral TID WC    Dialysis Orders: T[facility] 4h 64mn 450/800 2/2.25 bath 111.5kg Hep 2000 TDC - L AVF clotted while here, using TDC now  - darbe 50 ug q week, last 9/7 - calc 1.0 tiw  Assessment/Plan: 1.  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 2. ESRD: cont TTS schedule.  She appears to be close to euvolemic on exam and we  will undertake cautious ultrafiltration with reevaluation of EDW.  3. Anemia: S/p 1U PRBCs on 12/26, continue ESA. 4. CKD-MBD: Calcium level elevated (when corrected for low albumin) with phosphorus level at goal.  Continue sevelamer for phosphorus binding. 5. Nutrition: Continue current renal diet with fluid restriction and ongoing oral nutritional supplementation. 6. Hypotension: Chronic, remains on midodrine.  Monitor with hemodialysis. 7.  Stage IV sacral decubitus ulcer: Ongoing aggressive wound care status post debridement/completion of meropenem  Veneta Penton, PA-C 05/30/2020, 10:36 AM  [facility]

## 2020-05-30 NOTE — Discharge Instructions (Signed)
Inpatient Rehab Discharge Instructions  Wanda Ramirez Discharge date and time:  05/29/20   Activities/Precautions/ Functional Status: Activity: no lifting, driving, or strenuous exercise for till cleared by MD Diet: diabetic diet and renal diet--limit fluids ot 5 cups/day.  Wound Care: Cleanse with normal saline. Apply santyl to yellow eschar in wound bed and then cover with damp to dry dressing. Change once a day and more often if soiled.   Functional status:  ___ No restrictions     ___ Walk up steps independently _X__ 24/7 supervision/assistance   ___ Walk up steps with assistance ___ Intermittent supervision/assistance  ___ Bathe/dress independently ___ Walk with walker     ___ Bathe/dress with assistance ___ Walk Independently    ___ Shower independently ___ Walk with assistance    _X__ Shower with assistance _X__ No alcohol        COMMUNITY REFERRALS UPON DISCHARGE:    Home Health:   PT     OT        RN                   Agency: [facility] Phone: [phone]    Medical Equipment/Items Ordered: Bedside Commode, Tub Transfer Bench, Wheelchair, Conservation officer, nature                                                  Agency/Supplier: [employer]   Special Instructions: Please allow 24-72 hours for scheduling after discharge 1. Keep sacral wound clean. Change dressings daily and more frequently if soiled. Avoid lying on back--alternate side to side for pressure relief.      My questions have been answered and I understand these instructions. I will adhere to these goals and the provided educational materials after my discharge from the hospital.  Patient/Caregiver Signature _______________________________ Date __________  Clinician Signature _______________________________________ Date __________  Please bring this form and your medication list with you to all your follow-up doctor's appointments.

## 2020-05-30 NOTE — Procedures (Signed)
Interventional Radiology Procedure Note  Procedure: Exchange of left IJ approach tunneled HD catheter.  28cm tip to cuff.  Tip is positioned at the superior cavoatrial junction and catheter is ready for immediate use.  Complications: None Recommendations:  - Ok to use - Do not submerge - Routine line care   Signed,  Dulcy Fanny. Earleen Newport, DO

## 2020-05-31 ENCOUNTER — Encounter: Payer: Self-pay | Admitting: *Deleted

## 2020-05-31 DIAGNOSIS — Z8616 Personal history of COVID-19: Secondary | ICD-10-CM

## 2020-05-31 NOTE — Telephone Encounter (Signed)
Opened in error

## 2020-06-05 ENCOUNTER — Telehealth: Payer: Self-pay | Admitting: *Deleted

## 2020-06-05 NOTE — Telephone Encounter (Signed)
[facility] called to get VO for PT POC 1wk2,2wk2.  Approval given.

## 2020-06-07 ENCOUNTER — Encounter: Payer: Medicaid Other | Attending: Physical Medicine & Rehabilitation | Admitting: Physical Medicine & Rehabilitation

## 2020-06-07 DIAGNOSIS — E441 Mild protein-calorie malnutrition: Secondary | ICD-10-CM | POA: Insufficient documentation

## 2020-06-18 ENCOUNTER — Ambulatory Visit: Payer: Medicaid Other | Admitting: Podiatry

## 2020-06-19 ENCOUNTER — Other Ambulatory Visit (HOSPITAL_COMMUNITY): Payer: Self-pay | Admitting: Physician Assistant

## 2020-06-19 DIAGNOSIS — N186 End stage renal disease: Secondary | ICD-10-CM

## 2020-06-20 ENCOUNTER — Inpatient Hospital Stay (HOSPITAL_COMMUNITY): Admission: RE | Admit: 2020-06-20 | Payer: Medicaid Other | Source: Ambulatory Visit

## 2020-06-25 ENCOUNTER — Ambulatory Visit (INDEPENDENT_AMBULATORY_CARE_PROVIDER_SITE_OTHER): Payer: Medicaid Other | Admitting: Podiatry

## 2020-06-25 DIAGNOSIS — Z5329 Procedure and treatment not carried out because of patient's decision for other reasons: Secondary | ICD-10-CM

## 2020-06-25 NOTE — Progress Notes (Signed)
   Complete physical exam  Patient: Wanda Ramirez   DOB: 03/22/1999   50 y.o. Female  MRN: [id number]  Subjective:    No chief complaint on file.   Wanda Ramirez is a 50 y.o. female who presents today for a complete physical exam. She reports consuming a {diet types:17450} diet. {types:19826} She generally feels {DESC; WELL/FAIRLY WELL/POORLY:18703}. She reports sleeping {DESC; WELL/FAIRLY WELL/POORLY:18703}. She {does/does not:200015} have additional problems to discuss today.    Most recent fall risk assessment:    11/27/2021   10:42 AM  Fall Risk   Falls in the past year? 0  Number falls in past yr: 0  Injury with Fall? 0  Risk for fall due to : No Fall Risks  Follow up Falls evaluation completed     Most recent depression screenings:    11/27/2021   10:42 AM 10/18/2020   10:46 AM  PHQ 2/9 Scores  PHQ - 2 Score 0 0  PHQ- 9 Score 5     {VISON DENTAL STD PSA (Optional):27386}  {History (Optional):23778}  Patient Care Team: Jessup, Joy, NP as PCP - General (Nurse Practitioner)   Outpatient Medications Prior to Visit  Medication Sig   fluticasone (FLONASE) 50 MCG/ACT nasal spray Place 2 sprays into both nostrils in the morning and at bedtime. After 7 days, reduce to once daily.   norgestimate-ethinyl estradiol (SPRINTEC 28) 0.25-35 MG-MCG tablet Take 1 tablet by mouth daily.   Nystatin POWD Apply liberally to affected area 2 times per day   spironolactone (ALDACTONE) 100 MG tablet Take 1 tablet (100 mg total) by mouth daily.   No facility-administered medications prior to visit.    ROS        Objective:     There were no vitals taken for this visit. {Vitals History (Optional):23777}  Physical Exam   No results found for any visits on 01/02/22. {Show previous labs (optional):23779}    Assessment & Plan:    Routine Health Maintenance and Physical Exam  Immunization History  Administered Date(s) Administered   DTaP 06/05/1999, 08/01/1999,  10/10/1999, 06/25/2000, 01/09/2004   Hepatitis A 11/05/2007, 11/10/2008   Hepatitis B 03/23/1999, 04/30/1999, 10/10/1999   HiB (PRP-OMP) 06/05/1999, 08/01/1999, 10/10/1999, 06/25/2000   IPV 06/05/1999, 08/01/1999, 03/30/2000, 01/09/2004   Influenza,inj,Quad PF,6+ Mos 02/10/2014   Influenza-Unspecified 05/12/2012   MMR 03/30/2001, 01/09/2004   Meningococcal Polysaccharide 11/10/2011   Pneumococcal Conjugate-13 06/25/2000   Pneumococcal-Unspecified 10/10/1999, 12/24/1999   Tdap 11/10/2011   Varicella 03/30/2000, 11/05/2007    Health Maintenance  Topic Date Due   HIV Screening  Never done   Hepatitis C Screening  Never done   INFLUENZA VACCINE  12/31/2021   PAP-Cervical Cytology Screening  01/02/2022 (Originally 03/21/2020)   PAP SMEAR-Modifier  01/02/2022 (Originally 03/21/2020)   TETANUS/TDAP  01/02/2022 (Originally 11/09/2021)   HPV VACCINES  Discontinued   COVID-19 Vaccine  Discontinued    Discussed health benefits of physical activity, and encouraged her to engage in regular exercise appropriate for her age and condition.  Problem List Items Addressed This Visit   None Visit Diagnoses     Annual physical exam    -  Primary   Cervical cancer screening       Need for Tdap vaccination          No follow-ups on file.     Joy Jessup, NP   

## 2020-06-28 ENCOUNTER — Other Ambulatory Visit: Payer: Self-pay | Admitting: Physical Medicine and Rehabilitation

## 2020-06-29 ENCOUNTER — Telehealth: Payer: Self-pay

## 2020-06-29 NOTE — Telephone Encounter (Signed)
Wanda Ramirez, [facility] called requesting continuous of HHPT for pt from original order. Stated that they was waiting for Medicaid to approve more visits. Pt no showed for 06/07/2020 appt and no appt has been rescheduled. Is this okay?

## 2020-06-29 NOTE — Telephone Encounter (Signed)
This is ok

## 2020-07-26 ENCOUNTER — Other Ambulatory Visit: Payer: Self-pay | Admitting: *Deleted

## 2020-07-26 DIAGNOSIS — N186 End stage renal disease: Secondary | ICD-10-CM

## 2020-07-27 ENCOUNTER — Encounter: Payer: Self-pay | Admitting: Vascular Surgery

## 2020-07-27 ENCOUNTER — Ambulatory Visit (INDEPENDENT_AMBULATORY_CARE_PROVIDER_SITE_OTHER): Payer: Medicaid Other | Admitting: Vascular Surgery

## 2020-07-27 ENCOUNTER — Encounter: Payer: Self-pay | Admitting: *Deleted

## 2020-07-27 ENCOUNTER — Ambulatory Visit (HOSPITAL_COMMUNITY)
Admission: RE | Admit: 2020-07-27 | Discharge: 2020-07-27 | Disposition: A | Discharge status: Home / Self Care | Payer: Medicaid Other | Source: Ambulatory Visit | Attending: Vascular Surgery | Admitting: Vascular Surgery

## 2020-07-27 ENCOUNTER — Other Ambulatory Visit: Payer: Self-pay

## 2020-07-27 ENCOUNTER — Ambulatory Visit (INDEPENDENT_AMBULATORY_CARE_PROVIDER_SITE_OTHER)
Admission: RE | Admit: 2020-07-27 | Discharge: 2020-07-27 | Disposition: A | Discharge status: Home / Self Care | Payer: Medicaid Other | Source: Ambulatory Visit | Attending: Vascular Surgery | Admitting: Vascular Surgery

## 2020-07-27 ENCOUNTER — Other Ambulatory Visit: Payer: Self-pay | Admitting: *Deleted

## 2020-07-27 VITALS — BP 95/65 | HR 100 | Temp 98.1°F | Resp 20 | Ht 62.0 in | Wt 207.0 lb

## 2020-07-27 DIAGNOSIS — Z992 Dependence on renal dialysis: Secondary | ICD-10-CM | POA: Insufficient documentation

## 2020-07-27 DIAGNOSIS — N186 End stage renal disease: Secondary | ICD-10-CM | POA: Diagnosis not present

## 2020-07-27 NOTE — Progress Notes (Signed)
Patient ID: Wanda Ramirez, female   DOB: May 29, 1971, 50 y.o.   MRN: [id number]  Reason for Consult: New Patient (Initial Visit)   Referred by Welford Roche, NP  Subjective:     HPI:  Wanda Ramirez is a 50 y.o. female history of left arm AV fistula that was intervened upon multiple times this was done at [facility] several years ago.  She is now on dialysis via catheter.  She is here to discuss further dialysis access.  She currently dialyzes Tuesdays, Thursdays and Saturdays.  She is not on any blood thinners.  She is right-hand dominant.  Past Medical History:  Diagnosis Date  . Arthritis   . Asthma   . Chronic back pain   . Diabetes mellitus ([place])   . ESRD (end stage renal disease) on dialysis (HCC)    TTS  . Essential hypertension   . GERD (gastroesophageal reflux disease)   . Glaucoma   . Hyperlipidemia   . Hypothyroidism   . Morbid obesity ([place])   . Peripheral neuropathy   . Sleep apnea    wears CPAP, does not know setting   Family History  Problem Relation Age of Onset  . Diabetes Mother   . Hyperlipidemia Mother   . Kidney disease Father    Past Surgical History:  Procedure Laterality Date  . AV FISTULA PLACEMENT Left 04/2014  . CARDIAC CATHETERIZATION N/A 03/17/2016   Procedure: Left Heart Cath and Coronary Angiography;  Surgeon: [name], MD;  Location: [place] [facility];  Service: Cardiovascular;  Laterality: N/A;  . INCISION AND DRAINAGE PERIRECTAL ABSCESS N/A 04/03/2020   Procedure: EXCISIONAL DEBRIDEMENT OF SACRAL AND GLUTEAL WOUNDS;  Surgeon: [name], MD;  Location: [place];  Service: General;  Laterality: N/A;  . IR FLUORO GUIDE CV LINE LEFT  02/22/2020  . IR FLUORO GUIDE CV LINE LEFT  02/27/2020  . IR FLUORO GUIDE CV LINE LEFT  05/30/2020  . IR US GUIDE VASC ACCESS LEFT  02/22/2020  . KNEE SURGERY    . TUBAL LIGATION      Short Social History:  Social History   Tobacco Use  . Smoking status: Former Research scientist (life sciences)  .  Smokeless tobacco: Never Used  . Tobacco comment: Smoked x 2 yrs  Substance Use Topics  . Alcohol use: No    Comment: years ago - has since quit    Allergies  Allergen Reactions  . Hydrocodone Itching and Nausea And Vomiting    Current Outpatient Medications  Medication Sig Dispense Refill  . acetaminophen (TYLENOL) 325 MG tablet Take 1-2 tablets (325-650 mg total) by mouth every 4 (four) hours as needed for mild pain.    Marland Kitchen amLODipine (NORVASC) 10 MG tablet Take 10 mg by mouth daily as needed (high blood pressure).    Marland Kitchen aspirin EC 81 MG tablet Take 81 mg by mouth daily. Swallow whole.    . B Complex-C (B-COMPLEX WITH VITAMIN C) tablet Take 1 tablet by mouth daily. 30 tablet 0  . brimonidine (ALPHAGAN) 0.2 % ophthalmic solution Place 1 drop into both eyes 3 (three) times daily. 5 mL 12  . brinzolamide (AZOPT) 1 % ophthalmic suspension Place 1 drop into both eyes 3 (three) times daily. 10 mL 12  . camphor-menthol (SARNA) lotion Apply topically as needed for itching. 222 mL 0  . cinacalcet (SENSIPAR) 60 MG tablet Take 60 mg by mouth every other day.    . clonazePAM (KLONOPIN) 0.5 MG tablet Take  1/2 tablet (0.'25mg'$ ) po daily (Patient taking differently: Take 0.25 mg by mouth daily.) 15 tablet 0  . collagenase (SANTYL) ointment Apply topically daily. Apply to yellow slough and cover with damp to dry dressing. Change daily 30 g 6  . gabapentin (NEURONTIN) 100 MG capsule Take 100 mg by mouth 2 (two) times daily.    . hydrocerin (EUCERIN) CREA Apply 1 application topically daily. 454 g 0  . insulin aspart (NOVOLOG) 100 UNIT/ML FlexPen Inject 2 Units into the skin 3 (three) times daily with meals as needed for high blood sugar (Give 2 units of apart with each meal if patient is likely to eat 50% or more of her meal and BS >80). 15 mL 0  . insulin glargine (LANTUS) 100 unit/mL SOPN Inject 4 Units into the skin at bedtime. 15 mL 0  . latanoprost (XALATAN) 0.005 % ophthalmic solution Place 1 drop into  both eyes in the morning. 2.5 mL 12  . Latanoprostene Bunod (VYZULTA) 0.024 % SOLN Place 1 drop into both eyes in the morning and at bedtime.    Marland Kitchen levothyroxine (SYNTHROID) 125 MCG tablet Take 1 tablet (125 mcg total) by mouth daily before breakfast. 30 tablet 0  . lip balm (CARMEX) ointment Apply topically as needed for lip care. 7 g 0  . midodrine (PROAMATINE) 10 MG tablet Take 1 tablet (10 mg total) by mouth 3 (three) times daily with meals. (Patient taking differently: Take 10 mg by mouth 3 (three) times daily as needed (low blood pressure).) 90 tablet 0  . multivitamin (RENA-VIT) TABS tablet Take 1 tablet by mouth at bedtime. 30 tablet 0  . nutrition supplement, JUVEN, (JUVEN) PACK Take 1 packet by mouth 2 (two) times daily between meals. 60 each 0  . oxyCODONE (OXY IR/ROXICODONE) 5 MG immediate release tablet Take 1 tablet (5 mg total) by mouth every 6 (six) hours as needed for severe pain. 28 tablet 0  . pantoprazole (PROTONIX) 40 MG tablet Take 1 tablet (40 mg total) by mouth daily. 30 tablet 0  . pentafluoroprop-tetrafluoroeth (GEBAUERS) AERO Apply 1 application topically as needed (topical anesthesia for hemodialysis). 30 mL 0  . polyethylene glycol (MIRALAX / GLYCOLAX) 17 g packet Take 17 g by mouth daily as needed for mild constipation. 14 each 0  . QUEtiapine (SEROQUEL) 50 MG tablet Take 1 tablet (50 mg total) by mouth at bedtime. 30 tablet 0  . sevelamer carbonate (RENVELA) 800 MG tablet Take 2 tablets (1,600 mg total) by mouth 3 (three) times daily with meals. 180 tablet 0   No current facility-administered medications for this visit.    Review of Systems  Constitutional:  Constitutional negative. HENT: HENT negative.  Eyes: Eyes negative.  Respiratory: Positive for shortness of breath and wheezing.  GI: Gastrointestinal negative.  Musculoskeletal: Musculoskeletal negative.  Skin: Skin negative.  Neurological: Positive for dizziness and focal weakness.        Objective:   Objective   Vitals:   07/27/20 1140  BP: 95/65  Pulse: 100  Resp: 20  Temp: 98.1 F (36.7 C)  SpO2: 95%  Weight: 207 lb (93.9 kg)  Height: '5\' 2"'$  (1.575 m)   Body mass index is 37.86 kg/m.  Physical Exam HENT:     Head: Normocephalic.     Nose:     Comments: Wearing a mask Eyes:     Pupils: Pupils are equal, round, and reactive to light.  Neck:     Comments: Well-healed midline neck incision Cardiovascular:  Pulses:          Radial pulses are 0 on the right side and 0 on the left side.     Comments: Bilateral brachial arteries are 2+ palpable Pulmonary:     Effort: Pulmonary effort is normal.  Musculoskeletal:        General: Normal range of motion.     Cervical back: Normal range of motion.     Comments: Significant scarring left upper arm and site of previous fistula  Skin:    General: Skin is warm.     Capillary Refill: Capillary refill takes less than 2 seconds.  Neurological:     General: No focal deficit present.     Mental Status: She is alert.  Psychiatric:        Mood and Affect: Mood normal.        Behavior: Behavior normal.        Thought Content: Thought content normal.        Judgment: Judgment normal.     Data: +-----------------+-------------+----------+--------------+  Right Cephalic  Diameter (cm)Depth (cm)  Findings    +-----------------+-------------+----------+--------------+  Shoulder       0.22                 +-----------------+-------------+----------+--------------+  Prox upper arm    0.29                 +-----------------+-------------+----------+--------------+  Mid upper arm    0.22                 +-----------------+-------------+----------+--------------+  Dist upper arm    0.28                 +-----------------+-------------+----------+--------------+  Antecubital fossa  0.47                  +-----------------+-------------+----------+--------------+  Prox forearm     0.25                 +-----------------+-------------+----------+--------------+  Mid forearm     0.07                 +-----------------+-------------+----------+--------------+  Dist forearm               not visualized  +-----------------+-------------+----------+--------------+   +-----------------+-------------+----------+--------+  Right Basilic  Diameter (cm)Depth (cm)Findings  +-----------------+-------------+----------+--------+  Mid upper arm    0.27              +-----------------+-------------+----------+--------+  Dist upper arm    0.30              +-----------------+-------------+----------+--------+  Antecubital fossa  0.32              +-----------------+-------------+----------+--------+   +-----------------+-------------+----------+-------------+  Left Basilic   Diameter (cm)Depth (cm) Findings    +-----------------+-------------+----------+-------------+  Dist upper arm    0.31                 +-----------------+-------------+----------+-------------+  Antecubital fossa  0.40        then branches  +-----------------+-------------+----------+-------------+   Summary: Right: Patent cephalic and basilic veins.  Left: Patent basilic vein.      Assessment/Plan:     10-year female with previous left upper extremity access.  She is right-hand dominant appears to have better veins on the right arm particularly the basilic but no veins in her upper extremities completely ideal for access creation.  I discussed either proceeding with left arm fistula or graft or right arm fistula or graft.  Patient has elected  for right arm fistula or graft.  We discussed the risk and benefits including the risk of primary nonfunction, the  need for further procedures particularly with basilic vein fistula requiring transposition and graft requiring further procedures in the future.  She demonstrates good understanding we will get her scheduled on a nondialysis day in the near future.     Wanda Sandy MD [facility]

## 2020-07-27 NOTE — H&P (View-Only) (Signed)
Patient ID: Wanda Ramirez, female   DOB: Jul 08, 1970, 50 y.o.   MRN: [id number]  Reason for Consult: New Patient (Initial Visit)   Referred by Welford Roche, NP  Subjective:     HPI:  Wanda Ramirez is a 50 y.o. female history of left arm AV fistula that was intervened upon multiple times this was done at [facility] several years ago.  She is now on dialysis via catheter.  She is here to discuss further dialysis access.  She currently dialyzes Tuesdays, Thursdays and Saturdays.  She is not on any blood thinners.  She is right-hand dominant.  Past Medical History:  Diagnosis Date  . Arthritis   . Asthma   . Chronic back pain   . Diabetes mellitus ([place])   . ESRD (end stage renal disease) on dialysis (HCC)    TTS  . Essential hypertension   . GERD (gastroesophageal reflux disease)   . Glaucoma   . Hyperlipidemia   . Hypothyroidism   . Morbid obesity ([place])   . Peripheral neuropathy   . Sleep apnea    wears CPAP, does not know setting   Family History  Problem Relation Age of Onset  . Diabetes Mother   . Hyperlipidemia Mother   . Kidney disease Father    Past Surgical History:  Procedure Laterality Date  . AV FISTULA PLACEMENT Left 04/2014  . CARDIAC CATHETERIZATION N/A 03/17/2016   Procedure: Left Heart Cath and Coronary Angiography;  Surgeon: [name], MD;  Location: [place] [facility];  Service: Cardiovascular;  Laterality: N/A;  . INCISION AND DRAINAGE PERIRECTAL ABSCESS N/A 04/03/2020   Procedure: EXCISIONAL DEBRIDEMENT OF SACRAL AND GLUTEAL WOUNDS;  Surgeon: [name], MD;  Location: [place];  Service: General;  Laterality: N/A;  . IR FLUORO GUIDE CV LINE LEFT  02/22/2020  . IR FLUORO GUIDE CV LINE LEFT  02/27/2020  . IR FLUORO GUIDE CV LINE LEFT  05/30/2020  . IR US GUIDE VASC ACCESS LEFT  02/22/2020  . KNEE SURGERY    . TUBAL LIGATION      Short Social History:  Social History   Tobacco Use  . Smoking status: Former Research scientist (life sciences)  .  Smokeless tobacco: Never Used  . Tobacco comment: Smoked x 2 yrs  Substance Use Topics  . Alcohol use: No    Comment: years ago - has since quit    Allergies  Allergen Reactions  . Hydrocodone Itching and Nausea And Vomiting    Current Outpatient Medications  Medication Sig Dispense Refill  . acetaminophen (TYLENOL) 325 MG tablet Take 1-2 tablets (325-650 mg total) by mouth every 4 (four) hours as needed for mild pain.    Marland Kitchen amLODipine (NORVASC) 10 MG tablet Take 10 mg by mouth daily as needed (high blood pressure).    Marland Kitchen aspirin EC 81 MG tablet Take 81 mg by mouth daily. Swallow whole.    . B Complex-C (B-COMPLEX WITH VITAMIN C) tablet Take 1 tablet by mouth daily. 30 tablet 0  . brimonidine (ALPHAGAN) 0.2 % ophthalmic solution Place 1 drop into both eyes 3 (three) times daily. 5 mL 12  . brinzolamide (AZOPT) 1 % ophthalmic suspension Place 1 drop into both eyes 3 (three) times daily. 10 mL 12  . camphor-menthol (SARNA) lotion Apply topically as needed for itching. 222 mL 0  . cinacalcet (SENSIPAR) 60 MG tablet Take 60 mg by mouth every other day.    . clonazePAM (KLONOPIN) 0.5 MG tablet Take  1/2 tablet (0.'25mg'$ ) po daily (Patient taking differently: Take 0.25 mg by mouth daily.) 15 tablet 0  . collagenase (SANTYL) ointment Apply topically daily. Apply to yellow slough and cover with damp to dry dressing. Change daily 30 g 6  . gabapentin (NEURONTIN) 100 MG capsule Take 100 mg by mouth 2 (two) times daily.    . hydrocerin (EUCERIN) CREA Apply 1 application topically daily. 454 g 0  . insulin aspart (NOVOLOG) 100 UNIT/ML FlexPen Inject 2 Units into the skin 3 (three) times daily with meals as needed for high blood sugar (Give 2 units of apart with each meal if patient is likely to eat 50% or more of her meal and BS >80). 15 mL 0  . insulin glargine (LANTUS) 100 unit/mL SOPN Inject 4 Units into the skin at bedtime. 15 mL 0  . latanoprost (XALATAN) 0.005 % ophthalmic solution Place 1 drop into  both eyes in the morning. 2.5 mL 12  . Latanoprostene Bunod (VYZULTA) 0.024 % SOLN Place 1 drop into both eyes in the morning and at bedtime.    Marland Kitchen levothyroxine (SYNTHROID) 125 MCG tablet Take 1 tablet (125 mcg total) by mouth daily before breakfast. 30 tablet 0  . lip balm (CARMEX) ointment Apply topically as needed for lip care. 7 g 0  . midodrine (PROAMATINE) 10 MG tablet Take 1 tablet (10 mg total) by mouth 3 (three) times daily with meals. (Patient taking differently: Take 10 mg by mouth 3 (three) times daily as needed (low blood pressure).) 90 tablet 0  . multivitamin (RENA-VIT) TABS tablet Take 1 tablet by mouth at bedtime. 30 tablet 0  . nutrition supplement, JUVEN, (JUVEN) PACK Take 1 packet by mouth 2 (two) times daily between meals. 60 each 0  . oxyCODONE (OXY IR/ROXICODONE) 5 MG immediate release tablet Take 1 tablet (5 mg total) by mouth every 6 (six) hours as needed for severe pain. 28 tablet 0  . pantoprazole (PROTONIX) 40 MG tablet Take 1 tablet (40 mg total) by mouth daily. 30 tablet 0  . pentafluoroprop-tetrafluoroeth (GEBAUERS) AERO Apply 1 application topically as needed (topical anesthesia for hemodialysis). 30 mL 0  . polyethylene glycol (MIRALAX / GLYCOLAX) 17 g packet Take 17 g by mouth daily as needed for mild constipation. 14 each 0  . QUEtiapine (SEROQUEL) 50 MG tablet Take 1 tablet (50 mg total) by mouth at bedtime. 30 tablet 0  . sevelamer carbonate (RENVELA) 800 MG tablet Take 2 tablets (1,600 mg total) by mouth 3 (three) times daily with meals. 180 tablet 0   No current facility-administered medications for this visit.    Review of Systems  Constitutional:  Constitutional negative. HENT: HENT negative.  Eyes: Eyes negative.  Respiratory: Positive for shortness of breath and wheezing.  GI: Gastrointestinal negative.  Musculoskeletal: Musculoskeletal negative.  Skin: Skin negative.  Neurological: Positive for dizziness and focal weakness.        Objective:   Objective   Vitals:   07/27/20 1140  BP: 95/65  Pulse: 100  Resp: 20  Temp: 98.1 F (36.7 C)  SpO2: 95%  Weight: 207 lb (93.9 kg)  Height: '5\' 2"'$  (1.575 m)   Body mass index is 37.86 kg/m.  Physical Exam HENT:     Head: Normocephalic.     Nose:     Comments: Wearing a mask Eyes:     Pupils: Pupils are equal, round, and reactive to light.  Neck:     Comments: Well-healed midline neck incision Cardiovascular:  Pulses:          Radial pulses are 0 on the right side and 0 on the left side.     Comments: Bilateral brachial arteries are 2+ palpable Pulmonary:     Effort: Pulmonary effort is normal.  Musculoskeletal:        General: Normal range of motion.     Cervical back: Normal range of motion.     Comments: Significant scarring left upper arm and site of previous fistula  Skin:    General: Skin is warm.     Capillary Refill: Capillary refill takes less than 2 seconds.  Neurological:     General: No focal deficit present.     Mental Status: She is alert.  Psychiatric:        Mood and Affect: Mood normal.        Behavior: Behavior normal.        Thought Content: Thought content normal.        Judgment: Judgment normal.     Data: +-----------------+-------------+----------+--------------+  Right Cephalic  Diameter (cm)Depth (cm)  Findings    +-----------------+-------------+----------+--------------+  Shoulder       0.22                 +-----------------+-------------+----------+--------------+  Prox upper arm    0.29                 +-----------------+-------------+----------+--------------+  Mid upper arm    0.22                 +-----------------+-------------+----------+--------------+  Dist upper arm    0.28                 +-----------------+-------------+----------+--------------+  Antecubital fossa  0.47                  +-----------------+-------------+----------+--------------+  Prox forearm     0.25                 +-----------------+-------------+----------+--------------+  Mid forearm     0.07                 +-----------------+-------------+----------+--------------+  Dist forearm               not visualized  +-----------------+-------------+----------+--------------+   +-----------------+-------------+----------+--------+  Right Basilic  Diameter (cm)Depth (cm)Findings  +-----------------+-------------+----------+--------+  Mid upper arm    0.27              +-----------------+-------------+----------+--------+  Dist upper arm    0.30              +-----------------+-------------+----------+--------+  Antecubital fossa  0.32              +-----------------+-------------+----------+--------+   +-----------------+-------------+----------+-------------+  Left Basilic   Diameter (cm)Depth (cm) Findings    +-----------------+-------------+----------+-------------+  Dist upper arm    0.31                 +-----------------+-------------+----------+-------------+  Antecubital fossa  0.40        then branches  +-----------------+-------------+----------+-------------+   Summary: Right: Patent cephalic and basilic veins.  Left: Patent basilic vein.      Assessment/Plan:     44-year female with previous left upper extremity access.  She is right-hand dominant appears to have better veins on the right arm particularly the basilic but no veins in her upper extremities completely ideal for access creation.  I discussed either proceeding with left arm fistula or graft or right arm fistula or graft.  Patient has elected  for right arm fistula or graft.  We discussed the risk and benefits including the risk of primary nonfunction, the  need for further procedures particularly with basilic vein fistula requiring transposition and graft requiring further procedures in the future.  She demonstrates good understanding we will get her scheduled on a nondialysis day in the near future.     Waynetta Sandy MD [facility]

## 2020-08-13 ENCOUNTER — Encounter (HOSPITAL_COMMUNITY): Payer: Self-pay | Admitting: Vascular Surgery

## 2020-08-13 ENCOUNTER — Other Ambulatory Visit (HOSPITAL_COMMUNITY)
Admission: RE | Admit: 2020-08-13 | Discharge: 2020-08-13 | Disposition: A | Discharge status: Home / Self Care | Payer: Medicaid Other | Source: Ambulatory Visit | Attending: Vascular Surgery | Admitting: Vascular Surgery

## 2020-08-13 DIAGNOSIS — Z01812 Encounter for preprocedural laboratory examination: Secondary | ICD-10-CM | POA: Insufficient documentation

## 2020-08-13 DIAGNOSIS — Z20822 Contact with and (suspected) exposure to covid-19: Secondary | ICD-10-CM | POA: Diagnosis not present

## 2020-08-13 LAB — SARS CORONAVIRUS 2 (TAT 6-24 HRS): SARS Coronavirus 2: NEGATIVE

## 2020-08-14 ENCOUNTER — Other Ambulatory Visit: Payer: Self-pay

## 2020-08-14 ENCOUNTER — Encounter (HOSPITAL_COMMUNITY): Payer: Self-pay | Admitting: Vascular Surgery

## 2020-08-14 NOTE — Progress Notes (Signed)
Wanda Ramirez denies chest pain or shortness of breath. Patient was tested for Covid and has been in quarantine since that time, except to go to dialysis, following  protocol.   Wanda Ramirez has type II diabetes, patient reports that she checks CBG after she eats , it runs 200- 300. I instructed Wanda Ramirez to take 2 units of Lantus at bedtime tonight. I instructed patient to check CBG after awaking and every 2 hours until arrival  to the hospital.  I Instructed patient if CBG is less than 70 to drink 1/2 cup of a clear juice. Recheck CBG in 15 minutes if CBG is not over 70 call, pre- op desk at [phone] for further instructions.

## 2020-08-15 ENCOUNTER — Encounter (HOSPITAL_COMMUNITY): Payer: Self-pay | Admitting: Vascular Surgery

## 2020-08-15 ENCOUNTER — Ambulatory Visit (HOSPITAL_COMMUNITY)
Admission: RE | Admit: 2020-08-15 | Discharge: 2020-08-15 | Disposition: A | Discharge status: Home / Self Care | Payer: Medicaid Other | Source: Ambulatory Visit | Attending: Vascular Surgery | Admitting: Vascular Surgery

## 2020-08-15 ENCOUNTER — Ambulatory Visit (HOSPITAL_COMMUNITY): Payer: Medicaid Other | Admitting: Anesthesiology

## 2020-08-15 ENCOUNTER — Encounter (HOSPITAL_COMMUNITY)
Admission: RE | Disposition: A | Discharge status: Home / Self Care | Payer: Self-pay | Source: Ambulatory Visit | Attending: Vascular Surgery

## 2020-08-15 DIAGNOSIS — Z7982 Long term (current) use of aspirin: Secondary | ICD-10-CM | POA: Insufficient documentation

## 2020-08-15 DIAGNOSIS — Z794 Long term (current) use of insulin: Secondary | ICD-10-CM | POA: Diagnosis not present

## 2020-08-15 DIAGNOSIS — Z79890 Hormone replacement therapy (postmenopausal): Secondary | ICD-10-CM | POA: Insufficient documentation

## 2020-08-15 DIAGNOSIS — Z87891 Personal history of nicotine dependence: Secondary | ICD-10-CM | POA: Diagnosis not present

## 2020-08-15 DIAGNOSIS — N185 Chronic kidney disease, stage 5: Secondary | ICD-10-CM

## 2020-08-15 DIAGNOSIS — Z992 Dependence on renal dialysis: Secondary | ICD-10-CM | POA: Insufficient documentation

## 2020-08-15 DIAGNOSIS — Z79899 Other long term (current) drug therapy: Secondary | ICD-10-CM | POA: Diagnosis not present

## 2020-08-15 DIAGNOSIS — Z885 Allergy status to narcotic agent status: Secondary | ICD-10-CM | POA: Diagnosis not present

## 2020-08-15 DIAGNOSIS — I120 Hypertensive chronic kidney disease with stage 5 chronic kidney disease or end stage renal disease: Secondary | ICD-10-CM | POA: Insufficient documentation

## 2020-08-15 DIAGNOSIS — N186 End stage renal disease: Secondary | ICD-10-CM | POA: Insufficient documentation

## 2020-08-15 DIAGNOSIS — E1122 Type 2 diabetes mellitus with diabetic chronic kidney disease: Secondary | ICD-10-CM | POA: Diagnosis not present

## 2020-08-15 HISTORY — DX: Anxiety disorder, unspecified: F41.9

## 2020-08-15 HISTORY — DX: Anemia, unspecified: D64.9

## 2020-08-15 HISTORY — PX: AV FISTULA PLACEMENT: SHX1204

## 2020-08-15 HISTORY — DX: Personal history of other medical treatment: Z92.89

## 2020-08-15 LAB — POCT I-STAT, CHEM 8
BUN: 45 mg/dL — ABNORMAL HIGH (ref 6–20)
Calcium, Ion: 0.98 mmol/L — ABNORMAL LOW (ref 1.15–1.40)
Chloride: 100 mmol/L (ref 98–111)
Creatinine, Ser: 8.7 mg/dL — ABNORMAL HIGH (ref 0.44–1.00)
Glucose, Bld: 229 mg/dL — ABNORMAL HIGH (ref 70–99)
HCT: 36 % (ref 36.0–46.0)
Hemoglobin: 12.2 g/dL (ref 12.0–15.0)
Potassium: 5.3 mmol/L — ABNORMAL HIGH (ref 3.5–5.1)
Sodium: 132 mmol/L — ABNORMAL LOW (ref 135–145)
TCO2: 25 mmol/L (ref 22–32)

## 2020-08-15 LAB — GLUCOSE, CAPILLARY
Glucose-Capillary: 167 mg/dL — ABNORMAL HIGH (ref 70–99)
Glucose-Capillary: 217 mg/dL — ABNORMAL HIGH (ref 70–99)

## 2020-08-15 SURGERY — ARTERIOVENOUS (AV) FISTULA CREATION
Anesthesia: Monitor Anesthesia Care | Site: Arm Upper | Laterality: Right

## 2020-08-15 MED ORDER — FENTANYL CITRATE (PF) 100 MCG/2ML IJ SOLN
INTRAMUSCULAR | Status: AC
  Administered 2020-08-15: 50 ug via INTRAVENOUS
  Filled 2020-08-15: qty 2

## 2020-08-15 MED ORDER — AMISULPRIDE (ANTIEMETIC) 5 MG/2ML IV SOLN: 10.0000 mg | Freq: Once | INTRAVENOUS | Status: DC | PRN

## 2020-08-15 MED ORDER — ROPIVACAINE HCL 5 MG/ML IJ SOLN
INTRAMUSCULAR | Status: DC | PRN
  Administered 2020-08-15: 30 mL via PERINEURAL

## 2020-08-15 MED ORDER — FENTANYL CITRATE (PF) 250 MCG/5ML IJ SOLN
INTRAMUSCULAR | Status: DC | PRN
  Administered 2020-08-15 (×2): 50 ug via INTRAVENOUS

## 2020-08-15 MED ORDER — PHENYLEPHRINE HCL-NACL 10-0.9 MG/250ML-% IV SOLN
INTRAVENOUS | Status: AC
  Filled 2020-08-15: qty 250

## 2020-08-15 MED ORDER — SODIUM CHLORIDE 0.9 % IV SOLN
INTRAVENOUS | Status: DC | PRN
  Administered 2020-08-15: 500 mL

## 2020-08-15 MED ORDER — CEFAZOLIN SODIUM-DEXTROSE 2-4 GM/100ML-% IV SOLN
INTRAVENOUS | Status: AC
  Filled 2020-08-15: qty 100

## 2020-08-15 MED ORDER — KETAMINE HCL 10 MG/ML IJ SOLN
INTRAMUSCULAR | Status: DC | PRN
  Administered 2020-08-15: 10 mg via INTRAVENOUS

## 2020-08-15 MED ORDER — FENTANYL CITRATE (PF) 250 MCG/5ML IJ SOLN
INTRAMUSCULAR | Status: AC
  Filled 2020-08-15: qty 5

## 2020-08-15 MED ORDER — FENTANYL CITRATE (PF) 100 MCG/2ML IJ SOLN
50.0000 ug | Freq: Once | INTRAMUSCULAR | Status: AC
  Filled 2020-08-15: qty 1

## 2020-08-15 MED ORDER — PHENYLEPHRINE 40 MCG/ML (10ML) SYRINGE FOR IV PUSH (FOR BLOOD PRESSURE SUPPORT)
PREFILLED_SYRINGE | INTRAVENOUS | Status: AC
  Filled 2020-08-15: qty 10

## 2020-08-15 MED ORDER — SODIUM CHLORIDE 0.9 % IV SOLN: INTRAVENOUS | Status: DC

## 2020-08-15 MED ORDER — ONDANSETRON HCL 4 MG/2ML IJ SOLN: 4.0000 mg | Freq: Once | INTRAMUSCULAR | Status: DC | PRN

## 2020-08-15 MED ORDER — LIDOCAINE-EPINEPHRINE (PF) 1 %-1:200000 IJ SOLN
INTRAMUSCULAR | Status: AC
  Filled 2020-08-15: qty 30

## 2020-08-15 MED ORDER — FENTANYL CITRATE (PF) 100 MCG/2ML IJ SOLN: 25.0000 ug | INTRAMUSCULAR | Status: DC | PRN

## 2020-08-15 MED ORDER — 0.9 % SODIUM CHLORIDE (POUR BTL) OPTIME
TOPICAL | Status: DC | PRN
  Administered 2020-08-15: 1000 mL

## 2020-08-15 MED ORDER — ORAL CARE MOUTH RINSE: 15.0000 mL | Freq: Once | OROMUCOSAL | Status: AC

## 2020-08-15 MED ORDER — CHLORHEXIDINE GLUCONATE 0.12 % MT SOLN
OROMUCOSAL | Status: AC
  Administered 2020-08-15: 15 mL via OROMUCOSAL
  Filled 2020-08-15: qty 15

## 2020-08-15 MED ORDER — MIDAZOLAM HCL 5 MG/5ML IJ SOLN
INTRAMUSCULAR | Status: DC | PRN
  Administered 2020-08-15: 1 mg via INTRAVENOUS

## 2020-08-15 MED ORDER — SODIUM CHLORIDE 0.9 % IV SOLN
INTRAVENOUS | Status: AC
  Filled 2020-08-15: qty 1.2

## 2020-08-15 MED ORDER — CHLORHEXIDINE GLUCONATE 4 % EX LIQD: 60.0000 mL | Freq: Once | CUTANEOUS | Status: DC

## 2020-08-15 MED ORDER — PHENYLEPHRINE HCL-NACL 10-0.9 MG/250ML-% IV SOLN
INTRAVENOUS | Status: DC | PRN
  Administered 2020-08-15: 40 ug/min via INTRAVENOUS

## 2020-08-15 MED ORDER — CEFAZOLIN SODIUM-DEXTROSE 2-4 GM/100ML-% IV SOLN
2.0000 g | INTRAVENOUS | Status: AC
  Administered 2020-08-15: 2 g via INTRAVENOUS

## 2020-08-15 MED ORDER — PHENYLEPHRINE HCL (PRESSORS) 10 MG/ML IV SOLN
INTRAVENOUS | Status: DC | PRN
  Administered 2020-08-15 (×3): 80 ug via INTRAVENOUS

## 2020-08-15 MED ORDER — LIDOCAINE 2% (20 MG/ML) 5 ML SYRINGE
INTRAMUSCULAR | Status: DC | PRN
  Administered 2020-08-15: 20 mg via INTRAVENOUS

## 2020-08-15 MED ORDER — LIDOCAINE 2% (20 MG/ML) 5 ML SYRINGE
INTRAMUSCULAR | Status: AC
  Filled 2020-08-15: qty 5

## 2020-08-15 MED ORDER — ONDANSETRON HCL 4 MG/2ML IJ SOLN
INTRAMUSCULAR | Status: AC
  Filled 2020-08-15: qty 2

## 2020-08-15 MED ORDER — MIDAZOLAM HCL 2 MG/2ML IJ SOLN
INTRAMUSCULAR | Status: AC
  Filled 2020-08-15: qty 2

## 2020-08-15 MED ORDER — PROPOFOL 500 MG/50ML IV EMUL
INTRAVENOUS | Status: DC | PRN
  Administered 2020-08-15: 70 ug/kg/min via INTRAVENOUS

## 2020-08-15 MED ORDER — CHLORHEXIDINE GLUCONATE 0.12 % MT SOLN: 15.0000 mL | Freq: Once | OROMUCOSAL | Status: AC

## 2020-08-15 MED ORDER — KETAMINE HCL 50 MG/5ML IJ SOSY
PREFILLED_SYRINGE | INTRAMUSCULAR | Status: AC
  Filled 2020-08-15: qty 5

## 2020-08-15 SURGICAL SUPPLY — 28 items
ARMBAND PINK RESTRICT EXTREMIT (MISCELLANEOUS) ×2 IMPLANT
CANISTER SUCT 3000ML PPV (MISCELLANEOUS) ×2 IMPLANT
CLIP VESOCCLUDE MED 6/CT (CLIP) ×2 IMPLANT
CLIP VESOCCLUDE SM WIDE 6/CT (CLIP) ×4 IMPLANT
COVER PROBE W GEL 5X96 (DRAPES) IMPLANT
COVER WAND RF STERILE (DRAPES) IMPLANT
DERMABOND ADVANCED (GAUZE/BANDAGES/DRESSINGS) ×1
DERMABOND ADVANCED .7 DNX12 (GAUZE/BANDAGES/DRESSINGS) ×1 IMPLANT
ELECT REM PT RETURN 9FT ADLT (ELECTROSURGICAL) ×2
ELECTRODE REM PT RTRN 9FT ADLT (ELECTROSURGICAL) ×1 IMPLANT
GLOVE BIO SURGEON STRL SZ7.5 (GLOVE) ×2 IMPLANT
GOWN STRL REUS W/ TWL LRG LVL3 (GOWN DISPOSABLE) ×3 IMPLANT
GOWN STRL REUS W/ TWL XL LVL3 (GOWN DISPOSABLE) ×1 IMPLANT
GOWN STRL REUS W/TWL LRG LVL3 (GOWN DISPOSABLE) ×3
GOWN STRL REUS W/TWL XL LVL3 (GOWN DISPOSABLE) ×1
INSERT FOGARTY SM (MISCELLANEOUS) IMPLANT
KIT BASIN OR (CUSTOM PROCEDURE TRAY) ×2 IMPLANT
KIT TURNOVER KIT B (KITS) ×2 IMPLANT
NS IRRIG 1000ML POUR BTL (IV SOLUTION) ×2 IMPLANT
PACK CV ACCESS (CUSTOM PROCEDURE TRAY) ×2 IMPLANT
PAD ARMBOARD 7.5X6 YLW CONV (MISCELLANEOUS) ×4 IMPLANT
SUT MNCRL AB 4-0 PS2 18 (SUTURE) ×2 IMPLANT
SUT PROLENE 6 0 BV (SUTURE) ×4 IMPLANT
SUT VIC AB 3-0 SH 27 (SUTURE) ×1
SUT VIC AB 3-0 SH 27X BRD (SUTURE) ×1 IMPLANT
TOWEL GREEN STERILE (TOWEL DISPOSABLE) ×2 IMPLANT
UNDERPAD 30X36 HEAVY ABSORB (UNDERPADS AND DIAPERS) ×2 IMPLANT
WATER STERILE IRR 1000ML POUR (IV SOLUTION) ×2 IMPLANT

## 2020-08-15 NOTE — Transfer of Care (Signed)
Immediate Anesthesia Transfer of Care Note  Patient: Wanda Ramirez  Procedure(s) Performed: RIGHT BRACHIOCEPHALIC ARTERIOVENOUS (AV) FISTULA CREATION (Right Arm Upper)  Patient Location: PACU  Anesthesia Type:MAC and Regional  Level of Consciousness: awake, alert , oriented and patient cooperative  Airway & Oxygen Therapy: Patient Spontanous Breathing and Patient connected to face mask oxygen  Post-op Assessment: Report given to RN, Post -op Vital signs reviewed and stable and Patient moving all extremities X 4  Post vital signs: Reviewed and stable  Last Vitals:  Vitals Value Taken Time  BP    Temp    Pulse 87 08/15/20 1243  Resp 10 08/15/20 1243  SpO2 100 % 08/15/20 1243  Vitals shown include unvalidated device data.  Last Pain:  Vitals:   08/15/20 0941  TempSrc:   PainSc: 0-No pain         Complications: No complications documented.

## 2020-08-15 NOTE — Discharge Instructions (Signed)
   [facility]  Discharge Instructions  AV Fistula or Graft Surgery for Dialysis Access  Please refer to the following instructions for your post-procedure care. Your surgeon or physician assistant will discuss any changes with you.  Activity  You may drive the day following your surgery, if you are comfortable and no longer taking prescription pain medication. Resume full activity as the soreness in your incision resolves.  Bathing/Showering  You may shower after you go home. Keep your incision dry for 48 hours. Do not soak in a bathtub, hot tub, or swim until the incision heals completely. You may not shower if you have a hemodialysis catheter.  Incision Care  Clean your incision with mild soap and water after 48 hours. Pat the area dry with a clean towel. You do not need a bandage unless otherwise instructed. Do not apply any ointments or creams to your incision. You may have skin glue on your incision. Do not peel it off. It will come off on its own in about one week. Your arm may swell a bit after surgery. To reduce swelling use pillows to elevate your arm so it is above your heart. Your doctor will tell you if you need to lightly wrap your arm with an ACE bandage.  Diet  Resume your normal diet. There are not special food restrictions following this procedure. In order to heal from your surgery, it is CRITICAL to get adequate nutrition. Your body requires vitamins, minerals, and protein. Vegetables are the best source of vitamins and minerals. Vegetables also provide the perfect balance of protein. Processed food has little nutritional value, so try to avoid this.  Medications  Resume taking all of your medications. If your incision is causing pain, you may take over-the counter pain relievers such as acetaminophen (Tylenol). If you were prescribed a stronger pain medication, please be aware these medications can cause nausea and constipation. Prevent  nausea by taking the medication with a snack or meal. Avoid constipation by drinking plenty of fluids and eating foods with high amount of fiber, such as fruits, vegetables, and grains.  Do not take Tylenol if you are taking prescription pain medications.  Follow up Your surgeon may want to see you in the office following your access surgery. If so, this will be arranged at the time of your surgery.  Please call us immediately for any of the following conditions:  . Increased pain, redness, drainage (pus) from your incision site . Fever of 101 degrees or higher . Severe or worsening pain at your incision site . Hand pain or numbness. .  Reduce your risk of vascular disease:  . Stop smoking. If you would like help, call [facility] at 1-800-QUIT-NOW (256)768-2954) or [facility] at 514-134-4492  . Manage your cholesterol . Maintain a desired weight . Control your diabetes . Keep your blood pressure down  Dialysis  It will take several weeks to several months for your new dialysis access to be ready for use. Your surgeon will determine when it is okay to use it. Your nephrologist will continue to direct your dialysis. You can continue to use your Permcath until your new access is ready for use.   08/15/2020 Wanda Ramirez [id number] Oct 21, 1970  Surgeon(s): Waynetta Sandy, MD  Procedure(s): RIGHT BRACHIOCEPHALIC ARTERIOVENOUS (AV) FISTULA CREATION  x Do not stick fistula for 12 weeks    If you have any questions, please call the office at [phone].

## 2020-08-15 NOTE — Anesthesia Preprocedure Evaluation (Signed)
Anesthesia Evaluation  Patient identified by MRN, date of birth, ID band Patient awake    Reviewed: Allergy & Precautions, NPO status , Patient's Chart, lab work & pertinent test results  Airway Mallampati: II  TM Distance: >3 FB Neck ROM: Full    Dental   Pulmonary asthma , sleep apnea (on 4L), Continuous Positive Airway Pressure Ventilation and Oxygen sleep apnea , former smoker,     + decreased breath sounds      Cardiovascular Exercise Tolerance: Poor METS: < 3 Mets hypertension, Normal cardiovascular exam Rhythm:Regular Rate:Normal     Neuro/Psych Anxiety  Neuromuscular disease (peripheral neuropathy)    GI/Hepatic GERD  ,  Endo/Other  diabetesHypothyroidism Morbid obesity  Renal/GU DialysisRenal disease  negative genitourinary   Musculoskeletal  (+) Arthritis , Osteoarthritis,    Abdominal Normal abdominal exam  (+)   Peds negative pediatric ROS (+)  Hematology  (+) anemia ,   Anesthesia Other Findings Trach scar  Reproductive/Obstetrics                            Anesthesia Physical Anesthesia Plan  ASA: IV  Anesthesia Plan: Regional and MAC   Post-op Pain Management:  Regional for Post-op pain   Induction: Intravenous  PONV Risk Score and Plan: 2 and Ondansetron, Midazolam and Treatment may vary due to age or medical condition  Airway Management Planned: Natural Airway and Simple Face Mask  Additional Equipment: None  Intra-op Plan:   Post-operative Plan:   Informed Consent: I have reviewed the patients History and Physical, chart, labs and discussed the procedure including the risks, benefits and alternatives for the proposed anesthesia with the patient or authorized representative who has indicated his/her understanding and acceptance.       Plan Discussed with: CRNA, Anesthesiologist and Surgeon  Anesthesia Plan Comments: (Discussed MAC with regional block. GA/LMA  as backup plan. Will avoid intubation unless clinically necessary. )        Anesthesia Quick Evaluation

## 2020-08-15 NOTE — Interval H&P Note (Signed)
History and Physical Interval Note:  08/15/2020 10:31 AM  Wanda Ramirez  has presented today for surgery, with the diagnosis of ESRD.  The various methods of treatment have been discussed with the patient and family. After consideration of risks, benefits and other options for treatment, the patient has consented to  Procedure(s): RIGHT ARTERIOVENOUS (AV) FISTULA CREATION VERUS GRAFT (Right) as a surgical intervention.  The patient's history has been reviewed, patient examined, no change in status, stable for surgery.  I have reviewed the patient's chart and labs.  Questions were answered to the patient's satisfaction.     Servando Snare

## 2020-08-15 NOTE — Anesthesia Postprocedure Evaluation (Signed)
Anesthesia Post Note  Patient: Wanda Ramirez  Procedure(s) Performed: RIGHT BRACHIOCEPHALIC ARTERIOVENOUS (AV) FISTULA CREATION (Right Arm Upper)     Patient location during evaluation: PACU Anesthesia Type: Regional and MAC Level of consciousness: awake and alert Pain management: pain level controlled Vital Signs Assessment: post-procedure vital signs reviewed and stable Respiratory status: spontaneous breathing, nonlabored ventilation, respiratory function stable and patient connected to nasal cannula oxygen Cardiovascular status: stable and blood pressure returned to baseline Postop Assessment: no apparent nausea or vomiting Anesthetic complications: no   No complications documented.  Last Vitals:  Vitals:   08/15/20 1330 08/15/20 1343  BP: 131/64 122/75  Pulse: 79 78  Resp: 12 13  Temp:  (!) 36.1 C  SpO2: 97% 98%    Last Pain:  Vitals:   08/15/20 1343  TempSrc:   PainSc: 0-No pain                 Merlinda Frederick

## 2020-08-15 NOTE — Anesthesia Procedure Notes (Signed)
Anesthesia Regional Block: Supraclavicular block   Pre-Anesthetic Checklist: ,, timeout performed, Correct Patient, Correct Site, Correct Laterality, Correct Procedure, Correct Position, site marked, Risks and benefits discussed,  Surgical consent,  Pre-op evaluation,  At surgeon's request and post-op pain management  Laterality: Right  Prep: chloraprep       Needles:  Injection technique: Single-shot  Needle Type: Echogenic Stimulator Needle     Needle Length: 10cm      Additional Needles:   Procedures:,,,, ultrasound used (permanent image in chart),,,,  Narrative:  Start time: 08/15/2020 10:15 AM End time: 08/15/2020 10:20 AM Injection made incrementally with aspirations every 5 mL.  Performed by: Personally  Anesthesiologist: Merlinda Frederick, MD  Additional Notes: A functioning IV was confirmed and monitors were applied.  Sterile prep and drape, hand hygiene and sterile gloves were used.  Negative aspiration and test dose prior to incremental administration of local anesthetic. The patient tolerated the procedure well.Ultrasound  guidance: relevant anatomy identified, needle position confirmed, local anesthetic spread visualized around nerve(s), vascular puncture avoided.  Image printed for medical record.

## 2020-08-15 NOTE — Op Note (Signed)
    Patient name: Wanda Ramirez MRN: [id number] DOB: 06-Oct-1970 Sex: female  08/15/2020 Pre-operative Diagnosis: esrd Post-operative diagnosis:  Same Surgeon:  [name] C. Donzetta Matters, MD Assistant: Leontine Locket, PA Procedure Performed:  Right arm brachial artery to cephalic vein AV fistula creation  Indications: 50 year old female on dialysis via catheter.  She has previous left upper extremity access she is now indicated for right arm AV fistula versus graft.  An assistant was necessary to expedite the case.  Findings: Brachial artery at the antecubitum was free of disease but only measured approximately 2-1/2 mm.  The cephalic vein was larger at the antecubital and then the upper arm but it was patent throughout by duplex and was easily dilated 3-1/2 mm.  At completion there was a traceable signal in the vein throughout the upper arm and there was a stronger ulnar artery signal the radial artery signal.  There were no palpable pulses at the wrist consistent with preoperative exam.   Procedure:  The patient was identified in the holding area and taken to the operating room where she is placed supine on the operative table.  Preoperative right upper extremity block had been placed.  She was sterilely prepped and draped in the right upper extremity usual fashion, antibiotics were administered a timeout was called.  Ultrasound was used to identify what appeared to be a suitable cephalic vein and the brachial artery at the antecubitum.  The block was tested noted to be intact.  Transverse incision was made.  We dissected out the vein dividing branches between clips and ties.  The vein was marked for orientation.  We dissected through the deep fascia identified the brachial artery placed a vessel loop around this.  The vein was then transected and tied off distally.  Was dilated with 3 and half millimeters flushed with heparinized saline and clamped.  The arteries clamped distally proximally opened  longitudinally flushed with heparinized saline distally only.  The vein was then sewn end-to-side with 6-0 Prolene suture.  Upon completion we then flushed through the vein itself the vein did dilate very well.  The artery was quite diminutive after clamping.  There was flow by Doppler in the ulnar greater than the radial artery at the wrist there were no palpable pulses at the wrist this was consistent with preoperative exam.  There was good flow traced with Doppler in the upper arm cephalic vein fistula.  We did free of significant soft tissue at the antecubitum to allow the vein to sit without any tension.  We then irrigated the wound obtained stasis and closed in layers of Vicryl and Monocryl.  She was awakened from anesthesia having tolerated procedure well without immediate complication.  All counts were correct at completion.  EBL: 20 cc   Kyi Romanello C. Donzetta Matters, MD [facility] [place] Office: [phone] Pager: [phone]

## 2020-08-16 ENCOUNTER — Encounter (HOSPITAL_COMMUNITY): Payer: Self-pay | Admitting: Vascular Surgery

## 2020-08-21 DIAGNOSIS — D631 Anemia in chronic kidney disease: Secondary | ICD-10-CM

## 2020-08-21 DIAGNOSIS — F32A Depression, unspecified: Secondary | ICD-10-CM

## 2020-08-21 DIAGNOSIS — H409 Unspecified glaucoma: Secondary | ICD-10-CM

## 2020-08-21 DIAGNOSIS — N2581 Secondary hyperparathyroidism of renal origin: Secondary | ICD-10-CM

## 2020-08-21 DIAGNOSIS — J984 Other disorders of lung: Secondary | ICD-10-CM

## 2020-08-21 DIAGNOSIS — Z8616 Personal history of COVID-19: Secondary | ICD-10-CM

## 2020-08-21 DIAGNOSIS — F419 Anxiety disorder, unspecified: Secondary | ICD-10-CM

## 2020-08-21 DIAGNOSIS — E1122 Type 2 diabetes mellitus with diabetic chronic kidney disease: Secondary | ICD-10-CM

## 2020-08-21 DIAGNOSIS — N186 End stage renal disease: Secondary | ICD-10-CM

## 2020-08-21 DIAGNOSIS — E039 Hypothyroidism, unspecified: Secondary | ICD-10-CM

## 2020-08-21 DIAGNOSIS — I120 Hypertensive chronic kidney disease with stage 5 chronic kidney disease or end stage renal disease: Secondary | ICD-10-CM | POA: Diagnosis not present

## 2020-08-21 DIAGNOSIS — L89154 Pressure ulcer of sacral region, stage 4: Secondary | ICD-10-CM | POA: Diagnosis not present

## 2020-08-21 DIAGNOSIS — G8929 Other chronic pain: Secondary | ICD-10-CM

## 2020-08-21 DIAGNOSIS — Z6837 Body mass index (BMI) 37.0-37.9, adult: Secondary | ICD-10-CM

## 2020-08-21 DIAGNOSIS — M1990 Unspecified osteoarthritis, unspecified site: Secondary | ICD-10-CM

## 2020-08-21 DIAGNOSIS — E785 Hyperlipidemia, unspecified: Secondary | ICD-10-CM

## 2020-08-21 DIAGNOSIS — J45909 Unspecified asthma, uncomplicated: Secondary | ICD-10-CM

## 2020-08-21 DIAGNOSIS — M549 Dorsalgia, unspecified: Secondary | ICD-10-CM

## 2020-08-21 DIAGNOSIS — Z4800 Encounter for change or removal of nonsurgical wound dressing: Secondary | ICD-10-CM

## 2020-09-05 ENCOUNTER — Other Ambulatory Visit: Payer: Self-pay

## 2020-09-05 DIAGNOSIS — N186 End stage renal disease: Secondary | ICD-10-CM

## 2020-09-11 ENCOUNTER — Other Ambulatory Visit: Payer: Self-pay | Admitting: Physical Medicine & Rehabilitation

## 2020-09-20 DIAGNOSIS — N289 Disorder of kidney and ureter, unspecified: Secondary | ICD-10-CM | POA: Insufficient documentation

## 2020-09-20 DIAGNOSIS — N185 Chronic kidney disease, stage 5: Secondary | ICD-10-CM | POA: Insufficient documentation

## 2020-09-20 DIAGNOSIS — D649 Anemia, unspecified: Secondary | ICD-10-CM | POA: Insufficient documentation

## 2020-09-20 DIAGNOSIS — N186 End stage renal disease: Secondary | ICD-10-CM | POA: Insufficient documentation

## 2020-09-21 ENCOUNTER — Ambulatory Visit (INDEPENDENT_AMBULATORY_CARE_PROVIDER_SITE_OTHER): Payer: Medicaid Other | Admitting: Physician Assistant

## 2020-09-21 ENCOUNTER — Ambulatory Visit (HOSPITAL_COMMUNITY)
Admission: RE | Admit: 2020-09-21 | Discharge: 2020-09-21 | Disposition: A | Discharge status: Home / Self Care | Payer: Medicaid Other | Source: Ambulatory Visit | Attending: Vascular Surgery | Admitting: Vascular Surgery

## 2020-09-21 ENCOUNTER — Other Ambulatory Visit: Payer: Self-pay

## 2020-09-21 VITALS — BP 107/69 | HR 78 | Temp 98.0°F | Resp 20 | Ht 62.0 in | Wt 203.0 lb

## 2020-09-21 DIAGNOSIS — Z992 Dependence on renal dialysis: Secondary | ICD-10-CM

## 2020-09-21 DIAGNOSIS — N186 End stage renal disease: Secondary | ICD-10-CM

## 2020-09-21 NOTE — Progress Notes (Signed)
    Postoperative Access Visit   History of Present Illness   Wanda Ramirez is a 50 y.o. year old female who presents for postoperative follow-up for: right brachiocephalic arteriovenous fistula by Dr. Donzetta Matters (Date: 08/15/20).  The patient's wounds are healed.  The patient denies steal symptoms.  She is dialyzing on a TThS schedule.  Surgical history significant for several dialysis access surgeries of left arm with multiple revisions that were done in [facility].  Left IJ TDC placed by interventional radiology on 05/30/2020.  Physical Examination   Vitals:   09/21/20 1111  BP: 107/69  Pulse: 78  Resp: 20  Temp: 98 F (36.7 C)  TempSrc: Temporal  SpO2: 98%  Weight: 203 lb (92.1 kg)  Height: '5\' 2"'$  (1.575 m)   Body mass index is 37.13 kg/m.  right arm Incision is healed, palpable radial pulse, hand grip is 5/5, sensation in digits is intact, palpable thrill, bruit can be auscultated     Medical Decision Making   Wanda Ramirez is a 50 y.o. year old female who presents s/p right brachiocephalic arteriovenous fistula   Patent R brachiocephalic fistula without signs or symptoms of steal syndrome  Diameter of fistula is adequate however path of cephalic vein is somewhat deep; I offered the patient superficialization of fistula versus rechecking fistula duplex in 4 weeks.  She was made aware that she will likely still need superficialization surgery after recheck however patient would like to hold off on any further surgery at this time.  She is scheduled for repeat fistula duplex in 3 to 4 weeks.  Continue HD via left IJ [facility] for now   Dagoberto Ligas PA-C [facility] [place] Office: [phone]  Clinic MD: Stanford Breed on call

## 2020-09-25 ENCOUNTER — Other Ambulatory Visit: Payer: Self-pay

## 2020-09-25 DIAGNOSIS — N186 End stage renal disease: Secondary | ICD-10-CM

## 2020-10-08 ENCOUNTER — Encounter (HOSPITAL_COMMUNITY): Payer: Medicaid Other

## 2020-10-08 ENCOUNTER — Ambulatory Visit: Payer: Medicaid Other

## 2020-10-09 ENCOUNTER — Other Ambulatory Visit (HOSPITAL_COMMUNITY): Payer: Self-pay | Admitting: Internal Medicine

## 2020-10-09 DIAGNOSIS — N186 End stage renal disease: Secondary | ICD-10-CM

## 2020-10-10 ENCOUNTER — Ambulatory Visit: Payer: Medicaid Other

## 2020-10-10 ENCOUNTER — Other Ambulatory Visit: Payer: Self-pay | Admitting: Student

## 2020-10-10 ENCOUNTER — Ambulatory Visit (HOSPITAL_COMMUNITY)
Admission: RE | Admit: 2020-10-10 | Discharge: 2020-10-10 | Disposition: A | Discharge status: Home / Self Care | Payer: Medicaid Other | Source: Ambulatory Visit | Attending: Internal Medicine | Admitting: Internal Medicine

## 2020-10-10 ENCOUNTER — Other Ambulatory Visit: Payer: Self-pay

## 2020-10-10 ENCOUNTER — Ambulatory Visit (HOSPITAL_COMMUNITY): Payer: Medicaid Other

## 2020-10-10 DIAGNOSIS — T8249XA Other complication of vascular dialysis catheter, initial encounter: Secondary | ICD-10-CM | POA: Diagnosis present

## 2020-10-10 DIAGNOSIS — N186 End stage renal disease: Secondary | ICD-10-CM | POA: Insufficient documentation

## 2020-10-10 DIAGNOSIS — Y841 Kidney dialysis as the cause of abnormal reaction of the patient, or of later complication, without mention of misadventure at the time of the procedure: Secondary | ICD-10-CM | POA: Insufficient documentation

## 2020-10-10 HISTORY — PX: IR FLUORO GUIDE CV LINE LEFT: IMG2282

## 2020-10-10 MED ORDER — LIDOCAINE HCL (PF) 1 % IJ SOLN
INTRAMUSCULAR | Status: DC | PRN
  Administered 2020-10-10: 5 mL

## 2020-10-10 MED ORDER — CEFAZOLIN SODIUM-DEXTROSE 2-4 GM/100ML-% IV SOLN: 2.0000 g | Freq: Once | INTRAVENOUS | Status: AC

## 2020-10-10 MED ORDER — HEPARIN SODIUM (PORCINE) 1000 UNIT/ML IJ SOLN
INTRAMUSCULAR | Status: AC
  Filled 2020-10-10: qty 1

## 2020-10-10 MED ORDER — CEFAZOLIN SODIUM-DEXTROSE 2-4 GM/100ML-% IV SOLN
INTRAVENOUS | Status: AC
  Administered 2020-10-10: 2 g via INTRAVENOUS
  Filled 2020-10-10: qty 100

## 2020-10-10 MED ORDER — LIDOCAINE HCL 1 % IJ SOLN
INTRAMUSCULAR | Status: AC
  Filled 2020-10-10: qty 20

## 2020-10-10 NOTE — Procedures (Signed)
Interventional Radiology Procedure Note  Procedure: Replacement of a left IJ approach tunneled HD cath.  23cm tip to cuff.   Tip is positioned at the superior cavoatrial junction and catheter is ready for immediate use.  Complications: None Recommendations:  - Ok to use - OK to DC - Routine line care   Signed,  Dulcy Fanny. Earleen Newport, DO

## 2020-10-11 ENCOUNTER — Other Ambulatory Visit: Payer: Self-pay

## 2020-10-11 ENCOUNTER — Inpatient Hospital Stay (HOSPITAL_COMMUNITY)
Admission: EM | Admit: 2020-10-11 | Discharge: 2020-10-13 | DRG: 698 | Disposition: A | Discharge status: Home / Self Care | Payer: Medicaid Other | Attending: Internal Medicine | Admitting: Internal Medicine

## 2020-10-11 ENCOUNTER — Encounter (HOSPITAL_COMMUNITY): Payer: Self-pay

## 2020-10-11 ENCOUNTER — Emergency Department (HOSPITAL_COMMUNITY): Payer: Medicaid Other

## 2020-10-11 DIAGNOSIS — N2581 Secondary hyperparathyroidism of renal origin: Secondary | ICD-10-CM

## 2020-10-11 DIAGNOSIS — Z79899 Other long term (current) drug therapy: Secondary | ICD-10-CM

## 2020-10-11 DIAGNOSIS — E785 Hyperlipidemia, unspecified: Secondary | ICD-10-CM | POA: Diagnosis present

## 2020-10-11 DIAGNOSIS — M109 Gout, unspecified: Secondary | ICD-10-CM | POA: Diagnosis present

## 2020-10-11 DIAGNOSIS — F419 Anxiety disorder, unspecified: Secondary | ICD-10-CM

## 2020-10-11 DIAGNOSIS — I120 Hypertensive chronic kidney disease with stage 5 chronic kidney disease or end stage renal disease: Secondary | ICD-10-CM | POA: Diagnosis present

## 2020-10-11 DIAGNOSIS — Z841 Family history of disorders of kidney and ureter: Secondary | ICD-10-CM

## 2020-10-11 DIAGNOSIS — H409 Unspecified glaucoma: Secondary | ICD-10-CM

## 2020-10-11 DIAGNOSIS — K219 Gastro-esophageal reflux disease without esophagitis: Secondary | ICD-10-CM | POA: Diagnosis present

## 2020-10-11 DIAGNOSIS — G4733 Obstructive sleep apnea (adult) (pediatric): Secondary | ICD-10-CM | POA: Diagnosis present

## 2020-10-11 DIAGNOSIS — Z6839 Body mass index (BMI) 39.0-39.9, adult: Secondary | ICD-10-CM

## 2020-10-11 DIAGNOSIS — M1990 Unspecified osteoarthritis, unspecified site: Secondary | ICD-10-CM

## 2020-10-11 DIAGNOSIS — Z794 Long term (current) use of insulin: Secondary | ICD-10-CM

## 2020-10-11 DIAGNOSIS — Y712 Prosthetic and other implants, materials and accessory cardiovascular devices associated with adverse incidents: Secondary | ICD-10-CM | POA: Diagnosis present

## 2020-10-11 DIAGNOSIS — E1169 Type 2 diabetes mellitus with other specified complication: Secondary | ICD-10-CM | POA: Diagnosis present

## 2020-10-11 DIAGNOSIS — G40909 Epilepsy, unspecified, not intractable, without status epilepticus: Secondary | ICD-10-CM | POA: Diagnosis present

## 2020-10-11 DIAGNOSIS — Z87891 Personal history of nicotine dependence: Secondary | ICD-10-CM

## 2020-10-11 DIAGNOSIS — E039 Hypothyroidism, unspecified: Secondary | ICD-10-CM | POA: Diagnosis present

## 2020-10-11 DIAGNOSIS — Z20822 Contact with and (suspected) exposure to covid-19: Secondary | ICD-10-CM | POA: Diagnosis present

## 2020-10-11 DIAGNOSIS — J984 Other disorders of lung: Secondary | ICD-10-CM

## 2020-10-11 DIAGNOSIS — E1142 Type 2 diabetes mellitus with diabetic polyneuropathy: Secondary | ICD-10-CM | POA: Diagnosis present

## 2020-10-11 DIAGNOSIS — Z7982 Long term (current) use of aspirin: Secondary | ICD-10-CM

## 2020-10-11 DIAGNOSIS — E1122 Type 2 diabetes mellitus with diabetic chronic kidney disease: Secondary | ICD-10-CM | POA: Diagnosis present

## 2020-10-11 DIAGNOSIS — Z79890 Hormone replacement therapy (postmenopausal): Secondary | ICD-10-CM

## 2020-10-11 DIAGNOSIS — Z992 Dependence on renal dialysis: Secondary | ICD-10-CM

## 2020-10-11 DIAGNOSIS — D631 Anemia in chronic kidney disease: Secondary | ICD-10-CM | POA: Diagnosis present

## 2020-10-11 DIAGNOSIS — N186 End stage renal disease: Secondary | ICD-10-CM | POA: Diagnosis not present

## 2020-10-11 DIAGNOSIS — L89154 Pressure ulcer of sacral region, stage 4: Secondary | ICD-10-CM | POA: Diagnosis not present

## 2020-10-11 DIAGNOSIS — M549 Dorsalgia, unspecified: Secondary | ICD-10-CM

## 2020-10-11 DIAGNOSIS — Z4800 Encounter for change or removal of nonsurgical wound dressing: Secondary | ICD-10-CM

## 2020-10-11 DIAGNOSIS — Z8616 Personal history of COVID-19: Secondary | ICD-10-CM

## 2020-10-11 DIAGNOSIS — D72829 Elevated white blood cell count, unspecified: Secondary | ICD-10-CM | POA: Diagnosis present

## 2020-10-11 DIAGNOSIS — T829XXA Unspecified complication of cardiac and vascular prosthetic device, implant and graft, initial encounter: Secondary | ICD-10-CM | POA: Diagnosis present

## 2020-10-11 DIAGNOSIS — F32A Depression, unspecified: Secondary | ICD-10-CM

## 2020-10-11 DIAGNOSIS — I10 Essential (primary) hypertension: Secondary | ICD-10-CM | POA: Diagnosis present

## 2020-10-11 DIAGNOSIS — G4730 Sleep apnea, unspecified: Secondary | ICD-10-CM | POA: Diagnosis present

## 2020-10-11 DIAGNOSIS — Z9981 Dependence on supplemental oxygen: Secondary | ICD-10-CM

## 2020-10-11 DIAGNOSIS — I252 Old myocardial infarction: Secondary | ICD-10-CM

## 2020-10-11 DIAGNOSIS — G894 Chronic pain syndrome: Secondary | ICD-10-CM | POA: Diagnosis present

## 2020-10-11 DIAGNOSIS — Z6837 Body mass index (BMI) 37.0-37.9, adult: Secondary | ICD-10-CM

## 2020-10-11 DIAGNOSIS — E669 Obesity, unspecified: Secondary | ICD-10-CM | POA: Diagnosis present

## 2020-10-11 DIAGNOSIS — I9589 Other hypotension: Secondary | ICD-10-CM | POA: Diagnosis present

## 2020-10-11 DIAGNOSIS — J45909 Unspecified asthma, uncomplicated: Secondary | ICD-10-CM

## 2020-10-11 DIAGNOSIS — Z885 Allergy status to narcotic agent status: Secondary | ICD-10-CM

## 2020-10-11 DIAGNOSIS — T8241XA Breakdown (mechanical) of vascular dialysis catheter, initial encounter: Principal | ICD-10-CM | POA: Diagnosis present

## 2020-10-11 DIAGNOSIS — G8929 Other chronic pain: Secondary | ICD-10-CM

## 2020-10-11 DIAGNOSIS — E1140 Type 2 diabetes mellitus with diabetic neuropathy, unspecified: Secondary | ICD-10-CM | POA: Diagnosis present

## 2020-10-11 DIAGNOSIS — Z833 Family history of diabetes mellitus: Secondary | ICD-10-CM

## 2020-10-11 LAB — BASIC METABOLIC PANEL
Anion gap: 16 — ABNORMAL HIGH (ref 5–15)
BUN: 68 mg/dL — ABNORMAL HIGH (ref 6–20)
CO2: 21 mmol/L — ABNORMAL LOW (ref 22–32)
Calcium: 9.1 mg/dL (ref 8.9–10.3)
Chloride: 98 mmol/L (ref 98–111)
Creatinine, Ser: 13.81 mg/dL — ABNORMAL HIGH (ref 0.44–1.00)
GFR, Estimated: 3 mL/min — ABNORMAL LOW (ref >60)
Glucose, Bld: 283 mg/dL — ABNORMAL HIGH (ref 70–99)
Potassium: 4.8 mmol/L (ref 3.5–5.1)
Sodium: 135 mmol/L (ref 135–145)

## 2020-10-11 LAB — CBC WITH DIFFERENTIAL/PLATELET
Abs Immature Granulocytes: 0.05 10*3/uL (ref 0.00–0.07)
Basophils Absolute: 0.1 10*3/uL (ref 0.0–0.1)
Basophils Relative: 1 %
Eosinophils Absolute: 0.6 10*3/uL — ABNORMAL HIGH (ref 0.0–0.5)
Eosinophils Relative: 6 %
HCT: 31 % — ABNORMAL LOW (ref 36.0–46.0)
Hemoglobin: 10.4 g/dL — ABNORMAL LOW (ref 12.0–15.0)
Immature Granulocytes: 1 %
Lymphocytes Relative: 20 %
Lymphs Abs: 2 10*3/uL (ref 0.7–4.0)
MCH: 29.1 pg (ref 26.0–34.0)
MCHC: 33.5 g/dL (ref 30.0–36.0)
MCV: 86.8 fL (ref 80.0–100.0)
Monocytes Absolute: 0.6 10*3/uL (ref 0.1–1.0)
Monocytes Relative: 6 %
Neutro Abs: 6.7 10*3/uL (ref 1.7–7.7)
Neutrophils Relative %: 66 %
Platelets: 311 10*3/uL (ref 150–400)
RBC: 3.57 MIL/uL — ABNORMAL LOW (ref 3.87–5.11)
RDW: 17.4 % — ABNORMAL HIGH (ref 11.5–15.5)
WBC: 9.9 10*3/uL (ref 4.0–10.5)
nRBC: 0 % (ref 0.0–0.2)

## 2020-10-11 LAB — RESP PANEL BY RT-PCR (FLU A&B, COVID) ARPGX2
Influenza A by PCR: NEGATIVE
Influenza B by PCR: NEGATIVE
SARS Coronavirus 2 by RT PCR: NEGATIVE

## 2020-10-11 LAB — I-STAT BETA HCG BLOOD, ED (MC, WL, AP ONLY): I-stat hCG, quantitative: <5 m[IU]/mL (ref <5)

## 2020-10-11 NOTE — ED Triage Notes (Signed)
Pt here from dialysis (Tue, [place], Sat). Pt's dialysis catheter was not functioning at dialysis today. Access was placed yesterday.  Pt has had problems for past 10 days with dialysis access.  Pt has not had optimal dialysis sessions since problems began per note from dialysis.  Pt denies shortness of breath, leg swelling or pain anywhere.

## 2020-10-11 NOTE — ED Notes (Signed)
Pt informed RN in lobby that she has a wound on buttocks that needs to be changed at 9pm. Informed pt to notify staff at that time.

## 2020-10-11 NOTE — ED Provider Notes (Signed)
Emergency Medicine Provider Triage Evaluation Note  Wanda Ramirez , a 50 y.o. female  was evaluated in triage.  Pt complains of needing dialysis. Had has 3 dialysis catheter placed in the last 10 days and her dialysis treatments have not been optimal during this time. States that they tried to use her catheter at dialysis but it would not function. She goes t/th/sat. Denies chest pain, sob, leg swelling.   Review of Systems  Positive: Vascular access problem Negative: Chest pain, sob, leg swelling  Physical Exam  BP 138/83 (BP Location: Left Arm)   Pulse 75   Temp 98.4 F (36.9 C) (Oral)   Resp 14   SpO2 96%  Gen:   Awake, no distress   Resp:  Normal effort  MSK:   Moves extremities without difficulty  Other:  No ble swelling, lungs ctab, heart with rrr  Medical Decision Making  Medically screening exam initiated at 3:19 PM.  Appropriate orders placed.  [name] was informed that the remainder of the evaluation will be completed by another provider, this initial triage assessment does not replace that evaluation, and the importance of remaining in the ED until their evaluation is complete.    Rodney Booze, PA-C 10/11/20 [place], [place], DO 10/11/20 1528

## 2020-10-11 NOTE — ED Provider Notes (Signed)
[facility] EMERGENCY DEPARTMENT Provider Note  CSN: [id number] Arrival date & time: 10/11/20 1425  Chief Complaint(s) Vascular Access Problem  HPI Wanda Ramirez is a 50 y.o. female with a past medical history listed below including ESRD on dialysis TTS through urology PermCath in the left chest that has been replaced 3 times in the last 10 days due to it not being functional.  Last change was yesterday that was replaced by interventional radiology.  She returns today from dialysis after they were unable to utilize the catheter due to flow issues.  They are unable to pull back blood from the catheter.  She has not had a full session of dialysis due to the catheter issue.  Patient denies any shortness of breath.  No increased edema.  Denies any other physical complaints.  HPI  Past Medical History Past Medical History:  Diagnosis Date  . Anemia   . Anxiety   . Arthritis   . Asthma   . Chronic back pain   . Diabetes mellitus ([place])    Type II  . ESRD (end stage renal disease) on dialysis ([place])    TTS- [facility] since 2016  . Essential hypertension   . GERD (gastroesophageal reflux disease)   . Glaucoma   . History of blood transfusion   . Hyperlipidemia   . Hypothyroidism   . Morbid obesity ([place])   . Peripheral neuropathy   . Sleep apnea    wears CPAP, does not know setting   Patient Active Problem List   Diagnosis Date Noted  . Anemia 09/20/2020  . CKD (chronic kidney disease) stage 5, GFR less than 15 ml/min (HCC) 09/20/2020  . ESRD on hemodialysis ([place]) 09/20/2020  . Renal disorder 09/20/2020  . Mild protein-calorie malnutrition ([place]) 06/07/2020  . History of COVID-19 05/31/2020  . Post covid-19 condition, unspecified 05/30/2020  . Debility 05/15/2020  . Sacral wound   . Palliative care encounter   . Septic shock ([place])   . Cellulitis of buttock   . Fever   . Status post tracheostomy ([place])   . Aspiration into airway   . Pressure  injury of skin 03/07/2020  . ARDS (adult respiratory distress syndrome) ([place])   . Acute respiratory failure with hypoxemia ([place])   . Encephalopathy acute   . HCAP (healthcare-associated pneumonia)   . Dyspnea, unspecified 02/13/2020  . Cough 02/09/2020  . Abnormal myocardial perfusion study 07/26/2018  . Silent myocardial infarction ([place]) 07/15/2018  . Normal coronary arteries 04/07/2018  . Hypercalcemia 04/21/2016  . Encounter for antibody response examination 03/26/2016  . Weakness 03/17/2016  . Hypotension 03/17/2016  . Hyperlipidemia 03/17/2016  . Peripheral neuropathy 03/17/2016  . Precordial chest pain 03/17/2016  . End stage renal disease on dialysis ([place]) 03/16/2016  . Diabetes mellitus type 2 in obese ([place]) 03/16/2016  . Essential hypertension 03/16/2016  . Hypothyroidism 03/16/2016  . Glaucoma 03/16/2016  . GERD (gastroesophageal reflux disease) 03/16/2016  . OSA (obstructive sleep apnea) 03/16/2016  . Fluid overload, unspecified 10/23/2015  . Diabetic polyneuropathy associated with type 2 diabetes mellitus ([place]) 09/28/2015  . Long-term insulin use ([place]) 09/28/2015  . Morbid (severe) obesity due to excess calories ([place]) 09/28/2015  . Encounter for removal of sutures 02/28/2015  . Iron deficiency anemia, unspecified 01/27/2015  . Coagulation defect, unspecified ([place]) 01/16/2015  . Epilepsy, unspecified, not intractable, without status epilepticus ([place]) 01/16/2015  . Family history of disorders of kidney and ureter 01/16/2015  . Gout, unspecified 01/16/2015  . Other  disorders of phosphorus metabolism 01/16/2015  . Pain, unspecified 01/16/2015  . Pruritus, unspecified 01/16/2015  . Secondary hyperparathyroidism of renal origin ([place]) 01/16/2015  . Unspecified background retinopathy 01/16/2015  . Unspecified osteoarthritis, unspecified site 01/16/2015   Home Medication(s) Prior to Admission medications   Medication Sig Start Date End Date Taking? Authorizing Provider   acetaminophen (TYLENOL) 325 MG tablet Take 1-2 tablets (325-650 mg total) by mouth every 4 (four) hours as needed for mild pain. 05/29/20   Love, Ivan Anchors, PA-C  albuterol (VENTOLIN HFA) 108 (90 Base) MCG/ACT inhaler Inhale 1-2 puffs into the lungs every 6 (six) hours as needed for wheezing or shortness of breath.    [provider]  aspirin EC 81 MG tablet Take 81 mg by mouth daily. Swallow whole.    [provider]  AURYXIA 1 GM 210 MG(Fe) tablet Take 630 mg by mouth 3 (three) times daily with meals. 06/21/20   [provider]  B Complex-C (B-COMPLEX WITH VITAMIN C) tablet Take 1 tablet by mouth daily. 05/16/20   Bloomfield, Carley D, DO  brimonidine (ALPHAGAN) 0.2 % ophthalmic solution Place 1 drop into both eyes 3 (three) times daily. Patient taking differently: Place 1 drop into both eyes in the morning and at bedtime. 05/15/20   Bloomfield, Carley D, DO  brinzolamide (AZOPT) 1 % ophthalmic suspension Place 1 drop into both eyes 3 (three) times daily. 05/15/20   Bloomfield, Carley D, DO  cinacalcet (SENSIPAR) 60 MG tablet Take 60 mg by mouth every other day.    [provider]  clonazePAM (KLONOPIN) 0.5 MG tablet Take 1/2 tablet (0.'25mg'$ ) po daily Patient taking differently: Take 0.25 mg by mouth daily. 05/30/20   Love, Ivan Anchors, PA-C  gabapentin (NEURONTIN) 100 MG capsule Take 100 mg by mouth 2 (two) times daily.    [provider]  hydrocerin (EUCERIN) CREA Apply 1 application topically daily. 05/15/20   Bloomfield, Carley D, DO  insulin aspart (NOVOLOG) 100 UNIT/ML FlexPen Inject 2 Units into the skin 3 (three) times daily with meals as needed for high blood sugar (Give 2 units of apart with each meal if patient is likely to eat 50% or more of her meal and BS >80). Patient taking differently: Inject 2-8 Units into the skin 3 (three) times daily with meals. Sliding scale 05/30/20   Love, Ivan Anchors, PA-C  insulin glargine (LANTUS) 100 unit/mL SOPN  Inject 4 Units into the skin at bedtime. 05/30/20   Love, Ivan Anchors, PA-C  latanoprost (XALATAN) 0.005 % ophthalmic solution Place 1 drop into both eyes in the morning. 05/16/20   Bloomfield, Nila Nephew D, DO  levothyroxine (SYNTHROID) 125 MCG tablet Take 1 tablet (125 mcg total) by mouth daily before breakfast. 05/29/20   Love, Ivan Anchors, PA-C  lip balm (CARMEX) ointment Apply topically as needed for lip care. 05/15/20   Bloomfield, Carley D, DO  midodrine (PROAMATINE) 10 MG tablet Take 1 tablet (10 mg total) by mouth 3 (three) times daily with meals. Patient taking differently: Take 10 mg by mouth 3 (three) times daily as needed (low blood pressure). 05/29/20   Love, Ivan Anchors, PA-C  montelukast (SINGULAIR) 10 MG tablet Take 10 mg by mouth at bedtime.    [provider]  multivitamin (RENA-VIT) TABS tablet Take 1 tablet by mouth at bedtime. 05/29/20   Love, Ivan Anchors, PA-C  Netarsudil Dimesylate (RHOPRESSA) 0.02 % SOLN Place 1 drop into both eyes at bedtime.    [provider]  nutrition supplement,  JUVEN, (JUVEN) PACK Take 1 packet by mouth 2 (two) times daily between meals. Patient not taking: Reported on 08/02/2020 05/29/20   Love, Ivan Anchors, PA-C  Oxycodone HCl 10 MG TABS Take 10 mg by mouth every 6 (six) hours as needed (pain). 07/26/20   [provider]  pantoprazole (PROTONIX) 40 MG tablet Take 1 tablet (40 mg total) by mouth daily. 05/29/20   Love, Ivan Anchors, PA-C  polyethylene glycol (MIRALAX / GLYCOLAX) 17 g packet Take 17 g by mouth daily as needed for mild constipation. 05/15/20   Bloomfield, Carley D, DO  QUEtiapine (SEROQUEL) 50 MG tablet Take 1 tablet (50 mg total) by mouth at bedtime. 05/29/20   Bary Leriche, PA-C                                                                                                                                    Past Surgical History Past Surgical History:  Procedure Laterality Date  . AV FISTULA PLACEMENT Left 04/2014  . AV FISTULA  PLACEMENT Right 08/15/2020   Procedure: RIGHT BRACHIOCEPHALIC ARTERIOVENOUS (AV) FISTULA CREATION;  Surgeon: Waynetta Sandy, MD;  Location: [facility];  Service: Vascular;  Laterality: Right;  . CARDIAC CATHETERIZATION N/A 03/17/2016   Procedure: Left Heart Cath and Coronary Angiography;  Surgeon: [name], MD;  Location: [facility];  Service: Cardiovascular;  Laterality: N/A;  . INCISION AND DRAINAGE PERIRECTAL ABSCESS N/A 04/03/2020   Procedure: EXCISIONAL DEBRIDEMENT OF SACRAL AND GLUTEAL WOUNDS;  Surgeon: [name], MD;  Location: [facility];  Service: General;  Laterality: N/A;  . IR FLUORO GUIDE CV LINE LEFT  02/22/2020  . IR FLUORO GUIDE CV LINE LEFT  02/27/2020  . IR FLUORO GUIDE CV LINE LEFT  05/30/2020  . IR FLUORO GUIDE CV LINE LEFT  10/10/2020  . IR US GUIDE VASC ACCESS LEFT  02/22/2020  . KNEE SURGERY    . TUBAL LIGATION     Family History Family History  Problem Relation Age of Onset  . Diabetes Mother   . Hyperlipidemia Mother   . Kidney disease Father     Social History Social History   Tobacco Use  . Smoking status: Former Research scientist (life sciences)  . Smokeless tobacco: Never Used  . Tobacco comment: Smoked x 2 yrs  Vaping Use  . Vaping Use: Never used  Substance Use Topics  . Alcohol use: No    Comment: years ago - has since quit  . Drug use: No   Allergies Hydrocodone  Review of Systems Review of Systems All other systems are reviewed and are negative for acute change except as noted in the HPI  Physical Exam Vital Signs  I have reviewed the triage vital signs BP (!) 141/84 (BP Location: Left Wrist)   Pulse 73   Temp 98.4 F (36.9 C) (Oral)   Resp 16   SpO2 100%   Physical Exam Vitals reviewed.  Constitutional:      General: She is not in acute distress.    Appearance: She is well-developed. She is not diaphoretic.  HENT:     Head: Normocephalic and atraumatic.     Nose: Nose normal.  Eyes:     General: No scleral icterus.        Right eye: No discharge.        Left eye: No discharge.     Conjunctiva/sclera: Conjunctivae normal.     Pupils: Pupils are equal, round, and reactive to light.  Cardiovascular:     Rate and Rhythm: Normal rate and regular rhythm.     Heart sounds: No murmur heard. No friction rub. No gallop.   Pulmonary:     Effort: Pulmonary effort is normal. No respiratory distress.     Breath sounds: Normal breath sounds. No stridor. No rales.  Chest:    Abdominal:     General: There is no distension.     Palpations: Abdomen is soft.     Tenderness: There is no abdominal tenderness.  Musculoskeletal:        General: No tenderness.     Cervical back: Normal range of motion and neck supple.  Skin:    General: Skin is warm and dry.     Findings: No erythema or rash.  Neurological:     Mental Status: She is alert and oriented to person, place, and time.     ED Results and Treatments Labs (all labs ordered are listed, but only abnormal results are displayed) Labs Reviewed  CBC WITH DIFFERENTIAL/PLATELET - Abnormal; Notable for the following components:      Result Value   RBC 3.57 (*)    Hemoglobin 10.4 (*)    HCT 31.0 (*)    RDW 17.4 (*)    Eosinophils Absolute 0.6 (*)    All other components within normal limits  BASIC METABOLIC PANEL - Abnormal; Notable for the following components:   CO2 21 (*)    Glucose, Bld 283 (*)    BUN 68 (*)    Creatinine, Ser 13.81 (*)    GFR, Estimated 3 (*)    Anion gap 16 (*)    All other components within normal limits  RESP PANEL BY RT-PCR (FLU A&B, COVID) ARPGX2  I-STAT BETA HCG BLOOD, ED ([facility], [facility], [facility] ONLY)                                                                                                                         EKG  EKG Interpretation  Date/Time:    Ventricular Rate:    PR Interval:    QRS Duration:   QT Interval:    QTC Calculation:   R Axis:     Text Interpretation:        Radiology DG Chest 2 View  Result Date:  10/11/2020 CLINICAL DATA:  Nonfunctioning dialysis catheter with need for dialysis. EXAM: CHEST - 2 VIEW COMPARISON:  Chest x-ray from 05/17/2020,  follow-up films from prior catheter revision FINDINGS: Cardiac shadow is stable left dialysis catheter has withdrawn in the interval from the prior exam with the tip at the level of the mid superior vena cava. A previously was noted in the superior aspect of the right atrium. Mild vascular congestion is noted consistent with volume overload. No effusions are seen. No infiltrate is noted. Chronic scarring is noted bilaterally. IMPRESSION: Mild volume overload consistent with missed dialysis therapy. Dialysis catheter has withdrawn as described above. Electronically Signed   By: Inez Catalina M.D.   On: 10/11/2020 15:53    Pertinent labs & imaging results that were available during my care of the patient were reviewed by me and considered in my medical decision making (see chart for details).  Medications Ordered in ED Medications - No data to display                                                                                                                                  Procedures Procedures  (including critical care time)  Medical Decision Making / ED Course I have reviewed the nursing notes for this encounter and the patient's prior records (if available in EHR or on provided paperwork).   GREYSEN GUERINO was evaluated in Emergency Department on 10/12/2020 for the symptoms described in the history of present illness. She was evaluated in the context of the global COVID-19 pandemic, which necessitated consideration that the patient might be at risk for infection with the SARS-CoV-2 virus that causes COVID-19. Institutional protocols and algorithms that pertain to the evaluation of patients at risk for COVID-19 are in a state of rapid change based on information released by regulatory bodies including the CDC and federal and state organizations. These  policies and algorithms were followed during the patient's care in the ED.  Blood work is reassuring without hypokalemia.  Chest x-ray notable for mild interstitial edema related to incomplete dialysis.  Patient is satting well on her home oxygen of 4 L.  She is in no acute distress.  Given the patient's recurring issues with her PermCath and inability to complete dialysis sessions, I believe admission to the hospital to determine best course of action is most prudent.  I discussed this with Dr. Candiss Norse from nephrology who agreed.  We will admit patient to medicine team.  Nephrology to follow in the morning and coordinate additional care.      Final Clinical Impression(s) / ED Diagnoses Final diagnoses:  Hemodialysis catheter dysfunction, initial encounter [facility])      This chart was dictated using voice recognition software.  Despite best efforts to proofread,  errors can occur which can change the documentation meaning.   Fatima Blank, MD 10/12/20 [phone]

## 2020-10-12 ENCOUNTER — Encounter (HOSPITAL_COMMUNITY): Payer: Self-pay | Admitting: Internal Medicine

## 2020-10-12 ENCOUNTER — Observation Stay (HOSPITAL_COMMUNITY): Payer: Medicaid Other

## 2020-10-12 DIAGNOSIS — I120 Hypertensive chronic kidney disease with stage 5 chronic kidney disease or end stage renal disease: Secondary | ICD-10-CM | POA: Diagnosis present

## 2020-10-12 DIAGNOSIS — K219 Gastro-esophageal reflux disease without esophagitis: Secondary | ICD-10-CM | POA: Diagnosis present

## 2020-10-12 DIAGNOSIS — T829XXA Unspecified complication of cardiac and vascular prosthetic device, implant and graft, initial encounter: Secondary | ICD-10-CM

## 2020-10-12 DIAGNOSIS — M109 Gout, unspecified: Secondary | ICD-10-CM | POA: Diagnosis present

## 2020-10-12 DIAGNOSIS — H409 Unspecified glaucoma: Secondary | ICD-10-CM | POA: Diagnosis present

## 2020-10-12 DIAGNOSIS — G4733 Obstructive sleep apnea (adult) (pediatric): Secondary | ICD-10-CM | POA: Diagnosis present

## 2020-10-12 DIAGNOSIS — I10 Essential (primary) hypertension: Secondary | ICD-10-CM | POA: Diagnosis not present

## 2020-10-12 DIAGNOSIS — E1142 Type 2 diabetes mellitus with diabetic polyneuropathy: Secondary | ICD-10-CM | POA: Diagnosis present

## 2020-10-12 DIAGNOSIS — G894 Chronic pain syndrome: Secondary | ICD-10-CM | POA: Diagnosis present

## 2020-10-12 DIAGNOSIS — I9589 Other hypotension: Secondary | ICD-10-CM | POA: Diagnosis present

## 2020-10-12 DIAGNOSIS — Z20822 Contact with and (suspected) exposure to covid-19: Secondary | ICD-10-CM | POA: Diagnosis present

## 2020-10-12 DIAGNOSIS — E1122 Type 2 diabetes mellitus with diabetic chronic kidney disease: Secondary | ICD-10-CM | POA: Diagnosis present

## 2020-10-12 DIAGNOSIS — G4730 Sleep apnea, unspecified: Secondary | ICD-10-CM | POA: Diagnosis present

## 2020-10-12 DIAGNOSIS — T8241XA Breakdown (mechanical) of vascular dialysis catheter, initial encounter: Secondary | ICD-10-CM | POA: Diagnosis present

## 2020-10-12 DIAGNOSIS — M1990 Unspecified osteoarthritis, unspecified site: Secondary | ICD-10-CM | POA: Diagnosis present

## 2020-10-12 DIAGNOSIS — N186 End stage renal disease: Secondary | ICD-10-CM | POA: Diagnosis present

## 2020-10-12 DIAGNOSIS — D631 Anemia in chronic kidney disease: Secondary | ICD-10-CM | POA: Diagnosis present

## 2020-10-12 DIAGNOSIS — D72829 Elevated white blood cell count, unspecified: Secondary | ICD-10-CM | POA: Diagnosis present

## 2020-10-12 DIAGNOSIS — G40909 Epilepsy, unspecified, not intractable, without status epilepticus: Secondary | ICD-10-CM | POA: Diagnosis present

## 2020-10-12 DIAGNOSIS — E785 Hyperlipidemia, unspecified: Secondary | ICD-10-CM | POA: Diagnosis present

## 2020-10-12 DIAGNOSIS — N2581 Secondary hyperparathyroidism of renal origin: Secondary | ICD-10-CM | POA: Diagnosis present

## 2020-10-12 DIAGNOSIS — E039 Hypothyroidism, unspecified: Secondary | ICD-10-CM | POA: Diagnosis present

## 2020-10-12 DIAGNOSIS — F419 Anxiety disorder, unspecified: Secondary | ICD-10-CM | POA: Diagnosis present

## 2020-10-12 DIAGNOSIS — E669 Obesity, unspecified: Secondary | ICD-10-CM | POA: Diagnosis present

## 2020-10-12 DIAGNOSIS — E1140 Type 2 diabetes mellitus with diabetic neuropathy, unspecified: Secondary | ICD-10-CM | POA: Diagnosis present

## 2020-10-12 DIAGNOSIS — I252 Old myocardial infarction: Secondary | ICD-10-CM | POA: Diagnosis not present

## 2020-10-12 DIAGNOSIS — E1169 Type 2 diabetes mellitus with other specified complication: Secondary | ICD-10-CM | POA: Diagnosis not present

## 2020-10-12 DIAGNOSIS — Y712 Prosthetic and other implants, materials and accessory cardiovascular devices associated with adverse incidents: Secondary | ICD-10-CM | POA: Diagnosis present

## 2020-10-12 HISTORY — PX: IR FLUORO GUIDE CV LINE LEFT: IMG2282

## 2020-10-12 LAB — CBC
HCT: 30.5 % — ABNORMAL LOW (ref 36.0–46.0)
Hemoglobin: 10.2 g/dL — ABNORMAL LOW (ref 12.0–15.0)
MCH: 29.5 pg (ref 26.0–34.0)
MCHC: 33.4 g/dL (ref 30.0–36.0)
MCV: 88.2 fL (ref 80.0–100.0)
Platelets: 289 10*3/uL (ref 150–400)
RBC: 3.46 MIL/uL — ABNORMAL LOW (ref 3.87–5.11)
RDW: 17.4 % — ABNORMAL HIGH (ref 11.5–15.5)
WBC: 12 10*3/uL — ABNORMAL HIGH (ref 4.0–10.5)
nRBC: 0 % (ref 0.0–0.2)

## 2020-10-12 LAB — BASIC METABOLIC PANEL
Anion gap: 18 — ABNORMAL HIGH (ref 5–15)
BUN: 73 mg/dL — ABNORMAL HIGH (ref 6–20)
CO2: 21 mmol/L — ABNORMAL LOW (ref 22–32)
Calcium: 9.2 mg/dL (ref 8.9–10.3)
Chloride: 99 mmol/L (ref 98–111)
Creatinine, Ser: 14.68 mg/dL — ABNORMAL HIGH (ref 0.44–1.00)
GFR, Estimated: 3 mL/min — ABNORMAL LOW (ref >60)
Glucose, Bld: 137 mg/dL — ABNORMAL HIGH (ref 70–99)
Potassium: 5.1 mmol/L (ref 3.5–5.1)
Sodium: 138 mmol/L (ref 135–145)

## 2020-10-12 LAB — GLUCOSE, CAPILLARY
Glucose-Capillary: 156 mg/dL — ABNORMAL HIGH (ref 70–99)
Glucose-Capillary: 200 mg/dL — ABNORMAL HIGH (ref 70–99)
Glucose-Capillary: 137 mg/dL — ABNORMAL HIGH (ref 70–99)
Glucose-Capillary: 156 mg/dL — ABNORMAL HIGH (ref 70–99)

## 2020-10-12 MED ORDER — MONTELUKAST SODIUM 10 MG PO TABS
10.0000 mg | ORAL_TABLET | Freq: Every day | ORAL | Status: DC
  Administered 2020-10-12: 10 mg via ORAL
  Filled 2020-10-12: qty 1

## 2020-10-12 MED ORDER — INSULIN ASPART 100 UNIT/ML IJ SOLN
0.0000 [IU] | Freq: Three times a day (TID) | INTRAMUSCULAR | Status: DC
  Administered 2020-10-12 – 2020-10-13 (×3): 1 [IU] via SUBCUTANEOUS

## 2020-10-12 MED ORDER — SODIUM CHLORIDE 0.9 % IV SOLN: 100.0000 mL | INTRAVENOUS | Status: DC | PRN

## 2020-10-12 MED ORDER — OXYCODONE HCL 5 MG PO TABS
10.0000 mg | ORAL_TABLET | Freq: Four times a day (QID) | ORAL | Status: DC | PRN
  Administered 2020-10-12 – 2020-10-13 (×3): 10 mg via ORAL
  Filled 2020-10-12 (×3): qty 2

## 2020-10-12 MED ORDER — CHLORHEXIDINE GLUCONATE CLOTH 2 % EX PADS
6.0000 | MEDICATED_PAD | Freq: Every day | CUTANEOUS | Status: DC
  Administered 2020-10-13: 6 via TOPICAL

## 2020-10-12 MED ORDER — PENTAFLUOROPROP-TETRAFLUOROETH EX AERO: 1.0000 "application " | INHALATION_SPRAY | CUTANEOUS | Status: DC | PRN

## 2020-10-12 MED ORDER — QUETIAPINE FUMARATE 50 MG PO TABS
50.0000 mg | ORAL_TABLET | Freq: Every day | ORAL | Status: DC
  Administered 2020-10-12: 50 mg via ORAL
  Filled 2020-10-12: qty 1

## 2020-10-12 MED ORDER — MIDODRINE HCL 5 MG PO TABS
10.0000 mg | ORAL_TABLET | Freq: Three times a day (TID) | ORAL | Status: DC
  Administered 2020-10-12 – 2020-10-13 (×5): 10 mg via ORAL
  Filled 2020-10-12 (×5): qty 2

## 2020-10-12 MED ORDER — LIDOCAINE HCL (PF) 1 % IJ SOLN: 5.0000 mL | INTRAMUSCULAR | Status: DC | PRN

## 2020-10-12 MED ORDER — PANTOPRAZOLE SODIUM 40 MG PO TBEC
40.0000 mg | DELAYED_RELEASE_TABLET | Freq: Every day | ORAL | Status: DC
  Administered 2020-10-12 – 2020-10-13 (×2): 40 mg via ORAL
  Filled 2020-10-12 (×2): qty 1

## 2020-10-12 MED ORDER — LIDOCAINE HCL 1 % IJ SOLN
INTRAMUSCULAR | Status: AC
  Administered 2020-10-12: 4 mL via SUBCUTANEOUS
  Filled 2020-10-12: qty 20

## 2020-10-12 MED ORDER — HEPARIN SODIUM (PORCINE) 1000 UNIT/ML IJ SOLN
INTRAMUSCULAR | Status: AC
  Administered 2020-10-12: 4 [IU] via INTRAVENOUS_CENTRAL
  Filled 2020-10-12: qty 1

## 2020-10-12 MED ORDER — LEVOTHYROXINE SODIUM 25 MCG PO TABS
125.0000 ug | ORAL_TABLET | Freq: Every day | ORAL | Status: DC
  Administered 2020-10-12 – 2020-10-13 (×2): 125 ug via ORAL
  Filled 2020-10-12 (×3): qty 1

## 2020-10-12 MED ORDER — ACETAMINOPHEN 650 MG RE SUPP: 650.0000 mg | Freq: Four times a day (QID) | RECTAL | Status: DC | PRN

## 2020-10-12 MED ORDER — ACETAMINOPHEN 325 MG PO TABS: 650.0000 mg | ORAL_TABLET | Freq: Four times a day (QID) | ORAL | Status: DC | PRN

## 2020-10-12 MED ORDER — CHLORHEXIDINE GLUCONATE 4 % EX LIQD
CUTANEOUS | Status: AC
  Filled 2020-10-12: qty 15

## 2020-10-12 MED ORDER — HEPARIN SODIUM (PORCINE) 1000 UNIT/ML DIALYSIS: 1000.0000 [IU] | INTRAMUSCULAR | Status: DC | PRN

## 2020-10-12 MED ORDER — ALTEPLASE 2 MG IJ SOLR: 2.0000 mg | Freq: Once | INTRAMUSCULAR | Status: DC | PRN

## 2020-10-12 MED ORDER — LIDOCAINE-PRILOCAINE 2.5-2.5 % EX CREA: 1.0000 "application " | TOPICAL_CREAM | CUTANEOUS | Status: DC | PRN

## 2020-10-12 MED ORDER — GABAPENTIN 100 MG PO CAPS
100.0000 mg | ORAL_CAPSULE | Freq: Two times a day (BID) | ORAL | Status: DC
  Administered 2020-10-12 – 2020-10-13 (×3): 100 mg via ORAL
  Filled 2020-10-12 (×3): qty 1

## 2020-10-12 MED ORDER — POLYETHYLENE GLYCOL 3350 17 G PO PACK: 17.0000 g | PACK | Freq: Every day | ORAL | Status: DC | PRN

## 2020-10-12 NOTE — Consult Note (Signed)
[facility] Renal Consultation Note    Indication for Consultation:  Management of ESRD/hemodialysis; anemia, hypertension/volume and secondary hyperparathyroidism  [id number], Provider Not In  HPI: Wanda Ramirez is a 50 y.o. female with ESRD on HD TTS at [facility]. Reviewed OP HD records. Her last HD treatment was on 10/09/20 where she received only 3 hrs of treatment. She has a past medical history significant for HTN, DMT2, seizure disorder, GERD, hypothyroidism, osteoarthritis, and sacral wound. Seen and examined patient at bedside. She reports her HD catheter malfunctioned during her treatment which prompted her to go to the ER. Patient recently underwent TDC exchange on 10/10/20. Spoke to Agricultural consultant at [facility] [facility] HD center who informed me that this was her third exchange within the past 10 days. CXR showed mild overload which is consistent to previous missed HD treatments. Lab work shows: K+ 5.1, BUN 73, SrCr 14.68, Ca 9.2, and Hgb 10.2. Order for IR to exchange TDC. Spoke to patient's RN today who informed me her [facility] was exchanged earlier this morning. Plan is for patient to receive HD tomorrow morning 10/13/20 first case.  Past Medical History:  Diagnosis Date  . Anemia   . Anxiety   . Arthritis   . Asthma   . Chronic back pain   . Diabetes mellitus ([place])    Type II  . ESRD (end stage renal disease) on dialysis ([place])    TTS- [facility] since 2016  . Essential hypertension   . GERD (gastroesophageal reflux disease)   . Glaucoma   . History of blood transfusion   . Hyperlipidemia   . Hypothyroidism   . Morbid obesity ([place])   . Peripheral neuropathy   . Sleep apnea    wears CPAP, does not know setting   Past Surgical History:  Procedure Laterality Date  . AV FISTULA PLACEMENT Left 04/2014  . AV FISTULA PLACEMENT Right 08/15/2020   Procedure: RIGHT BRACHIOCEPHALIC ARTERIOVENOUS (AV) FISTULA CREATION;  Surgeon: Waynetta Sandy, MD;   Location: [place];  Service: Vascular;  Laterality: Right;  . CARDIAC CATHETERIZATION N/A 03/17/2016   Procedure: Left Heart Cath and Coronary Angiography;  Surgeon: [name], MD;  Location: [place] [facility];  Service: Cardiovascular;  Laterality: N/A;  . INCISION AND DRAINAGE PERIRECTAL ABSCESS N/A 04/03/2020   Procedure: EXCISIONAL DEBRIDEMENT OF SACRAL AND GLUTEAL WOUNDS;  Surgeon: [name], MD;  Location: [place];  Service: General;  Laterality: N/A;  . IR FLUORO GUIDE CV LINE LEFT  02/22/2020  . IR FLUORO GUIDE CV LINE LEFT  02/27/2020  . IR FLUORO GUIDE CV LINE LEFT  05/30/2020  . IR FLUORO GUIDE CV LINE LEFT  10/10/2020  . IR FLUORO GUIDE CV LINE LEFT  10/12/2020  . IR US GUIDE VASC ACCESS LEFT  02/22/2020  . KNEE SURGERY    . TUBAL LIGATION     Family History  Problem Relation Age of Onset  . Diabetes Mother   . Hyperlipidemia Mother   . Kidney disease Father    Social History:  reports that she has quit smoking. She has never used smokeless tobacco. She reports that she does not drink alcohol and does not use drugs. Allergies  Allergen Reactions  . Hydrocodone Itching and Nausea And Vomiting   Prior to Admission medications   Medication Sig Start Date End Date Taking? Authorizing Provider  acetaminophen (TYLENOL) 325 MG tablet Take 1-2 tablets (325-650 mg total) by mouth every 4 (four) hours as needed for  mild pain. 05/29/20  Yes Love, Ivan Anchors, PA-C  albuterol (VENTOLIN HFA) 108 (90 Base) MCG/ACT inhaler Inhale 1-2 puffs into the lungs every 6 (six) hours as needed for wheezing or shortness of breath.   Yes [provider]  aspirin EC 81 MG tablet Take 81 mg by mouth daily. Swallow whole.   Yes [provider]  AURYXIA 1 GM 210 MG(Fe) tablet Take 630 mg by mouth 3 (three) times daily with meals. 06/21/20  Yes [provider]  B Complex-C (B-COMPLEX WITH VITAMIN C) tablet Take 1 tablet by mouth daily. 05/16/20  Yes Bloomfield, Carley D,  DO  brimonidine (ALPHAGAN) 0.2 % ophthalmic solution Place 1 drop into both eyes 3 (three) times daily. Patient taking differently: Place 1 drop into both eyes in the morning and at bedtime. 05/15/20  Yes Bloomfield, Carley D, DO  brinzolamide (AZOPT) 1 % ophthalmic suspension Place 1 drop into both eyes 3 (three) times daily. 05/15/20  Yes Bloomfield, Carley D, DO  clonazePAM (KLONOPIN) 0.5 MG tablet Take 1/2 tablet (0.'25mg'$ ) po daily Patient taking differently: Take 0.25 mg by mouth daily. 05/30/20  Yes Love, Ivan Anchors, PA-C  Dulaglutide (TRULICITY) [id number] SOPN Inject 0.75 mg into the skin every Monday. 08/28/20  Yes [provider]  gabapentin (NEURONTIN) 100 MG capsule Take 100 mg by mouth 2 (two) times daily.   Yes [provider]  insulin glargine (LANTUS) 100 unit/mL SOPN Inject 4 Units into the skin at bedtime. 05/30/20  Yes Love, Ivan Anchors, PA-C  latanoprost (XALATAN) 0.005 % ophthalmic solution Place 1 drop into both eyes in the morning. 05/16/20  Yes Bloomfield, Carley D, DO  levothyroxine (SYNTHROID) 125 MCG tablet Take 1 tablet (125 mcg total) by mouth daily before breakfast. 05/29/20  Yes Love, Ivan Anchors, PA-C  lip balm (CARMEX) ointment Apply topically as needed for lip care. 05/15/20  Yes Bloomfield, Carley D, DO  midodrine (PROAMATINE) 10 MG tablet Take 1 tablet (10 mg total) by mouth 3 (three) times daily with meals. Patient taking differently: Take 10 mg by mouth 3 (three) times daily as needed (low blood pressure). 05/29/20  Yes Love, Ivan Anchors, PA-C  montelukast (SINGULAIR) 10 MG tablet Take 10 mg by mouth at bedtime.   Yes [provider]  Netarsudil Dimesylate (RHOPRESSA) 0.02 % SOLN Place 1 drop into both eyes at bedtime.   Yes [provider]  Oxycodone HCl 10 MG TABS Take 10 mg by mouth every 6 (six) hours as needed (pain). 07/26/20  Yes [provider]  pantoprazole (PROTONIX) 40 MG tablet Take 1 tablet (40 mg total) by mouth  daily. 05/29/20  Yes Love, Ivan Anchors, PA-C  polyethylene glycol (MIRALAX / GLYCOLAX) 17 g packet Take 17 g by mouth daily as needed for mild constipation. 05/15/20  Yes Bloomfield, Carley D, DO  QUEtiapine (SEROQUEL) 50 MG tablet Take 1 tablet (50 mg total) by mouth at bedtime. 05/29/20  Yes Love, Ivan Anchors, PA-C  hydrocerin (EUCERIN) CREA Apply 1 application topically daily. Patient not taking: No sig reported 05/15/20   Bloomfield, Nila Nephew D, DO  insulin aspart (NOVOLOG) 100 UNIT/ML FlexPen Inject 2 Units into the skin 3 (three) times daily with meals as needed for high blood sugar (Give 2 units of apart with each meal if patient is likely to eat 50% or more of her meal and BS >80). Patient not taking: No sig reported 05/30/20   Bary Leriche, PA-C  multivitamin (RENA-VIT) TABS tablet Take 1 tablet  by mouth at bedtime. Patient not taking: No sig reported 05/29/20   Love, Ivan Anchors, PA-C  nutrition supplement, JUVEN, (JUVEN) PACK Take 1 packet by mouth 2 (two) times daily between meals. Patient not taking: No sig reported 05/29/20   Bary Leriche, PA-C   Current Facility-Administered Medications  Medication Dose Route Frequency Provider Last Rate Last Admin  . acetaminophen (TYLENOL) tablet 650 mg  650 mg Oral Q6H PRN Rise Patience, MD       Or  . acetaminophen (TYLENOL) suppository 650 mg  650 mg Rectal Q6H PRN Rise Patience, MD      . gabapentin (NEURONTIN) capsule 100 mg  100 mg Oral BID Rise Patience, MD   100 mg at 10/12/20 0916  . insulin aspart (novoLOG) injection 0-6 Units  0-6 Units Subcutaneous TID WC Rise Patience, MD   1 Units at 10/12/20 1321  . levothyroxine (SYNTHROID) tablet 125 mcg  125 mcg Oral Q0600 Rise Patience, MD   125 mcg at 10/12/20 0916  . midodrine (PROAMATINE) tablet 10 mg  10 mg Oral TID WC Rise Patience, MD   10 mg at 10/12/20 1316  . montelukast (SINGULAIR) tablet 10 mg  10 mg Oral QHS Alma Friendly, MD      .  oxyCODONE (Oxy IR/ROXICODONE) immediate release tablet 10 mg  10 mg Oral Q6H PRN Rise Patience, MD   10 mg at 10/12/20 1316  . pantoprazole (PROTONIX) EC tablet 40 mg  40 mg Oral Q1200 Rise Patience, MD   40 mg at 10/12/20 1316  . polyethylene glycol (MIRALAX / GLYCOLAX) packet 17 g  17 g Oral Daily PRN Alma Friendly, MD      . QUEtiapine (SEROQUEL) tablet 50 mg  50 mg Oral QHS Rise Patience, MD       Labs: Basic Metabolic Panel: Recent Labs  Lab 10/11/20 1516 10/12/20 0225  NA 135 138  K 4.8 5.1  CL 98 99  CO2 21* 21*  GLUCOSE 283* 137*  BUN 68* 73*  CREATININE 13.81* 14.68*  CALCIUM 9.1 9.2   Liver Function Tests: No results for input(s): AST, ALT, ALKPHOS, BILITOT, PROT, ALBUMIN in the last 168 hours. No results for input(s): LIPASE, AMYLASE in the last 168 hours. No results for input(s): AMMONIA in the last 168 hours. CBC: Recent Labs  Lab 10/11/20 1516 10/12/20 0225  WBC 9.9 12.0*  NEUTROABS 6.7  --   HGB 10.4* 10.2*  HCT 31.0* 30.5*  MCV 86.8 88.2  PLT 311 289   Cardiac Enzymes: No results for input(s): CKTOTAL, CKMB, CKMBINDEX, TROPONINI in the last 168 hours. CBG: Recent Labs  Lab 10/12/20 0832 10/12/20 1320 10/12/20 1647  GLUCAP 156* 200* 137*   Iron Studies: No results for input(s): IRON, TIBC, TRANSFERRIN, FERRITIN in the last 72 hours. Studies/Results: DG Chest 2 View  Result Date: 10/11/2020 CLINICAL DATA:  Nonfunctioning dialysis catheter with need for dialysis. EXAM: CHEST - 2 VIEW COMPARISON:  Chest x-ray from 05/17/2020, follow-up films from prior catheter revision FINDINGS: Cardiac shadow is stable left dialysis catheter has withdrawn in the interval from the prior exam with the tip at the level of the mid superior vena cava. A previously was noted in the superior aspect of the right atrium. Mild vascular congestion is noted consistent with volume overload. No effusions are seen. No infiltrate is noted. Chronic scarring  is noted bilaterally. IMPRESSION: Mild volume overload consistent with missed dialysis therapy. Dialysis  catheter has withdrawn as described above. Electronically Signed   By: Inez Catalina M.D.   On: 10/11/2020 15:53   IR Fluoro Guide CV Line Left  Result Date: 10/12/2020 INDICATION: History of end-stage renal disease with chronic left jugular approach dialysis catheter post fluoroscopic guided exchange on 10/10/2020 Unfortunately, despite appropriate position of the dialysis catheter following exchange, the dialysis catheter has retracted to the level of the azygos vein and as such patient presents today for fluoroscopic guided exchange and lengthening of the present 23 cm tip to cuff dialysis catheter to a 28 cm dialysis catheter. EXAM: TUNNELED CENTRAL VENOUS HEMODIALYSIS CATHETER REPLACEMENT WITH FLUOROSCOPIC GUIDANCE MEDICATIONS: None FLUOROSCOPY TIME:  1 minute, 24 seconds (29 mGy) COMPLICATIONS: None immediate. PROCEDURE: Informed written consent was obtained from the patient after a discussion of the risks, benefits, and alternatives to treatment. Questions regarding the procedure were encouraged and answered. The skin and external portion of the existing hemodialysis catheter was prepped with chlorhexidine in a sterile fashion, and a sterile drape was applied covering the operative field. Maximum barrier sterile technique with sterile gowns and gloves were used for the procedure. A timeout was performed prior to the initiation of the procedure. Preprocedural spot fluoroscopic image was obtained demonstrating retraction of the recently exchanged dialysis catheter to the level of the azygos vein. Both lumens of the hemodialysis catheter were cannulated with a stiff Glidewire and advanced to the level of the main pulmonary artery. Under intermittent fluoroscopic guidance, the existing dialysis catheter was exchanged for a new, slightly larger now 28 cm tip to cuff palindrome dialysis catheter with tips  ultimately terminating within the superior aspect of the right atrium. Final catheter positioning was confirmed and documented with a spot radiographic image. The catheter aspirates and flushes normally. The catheter was flushed with appropriate volume heparin dwells. The catheter exit site was secured with a 0-Prolene retention sutures. Both lumens were heparinized. A dressing was placed. The patient tolerated the procedure well without immediate post procedural complication. IMPRESSION: Successful replacement of 28 cm tip to cuff tunneled hemodialysis catheter with tips terminating within the right atrium. The catheter is ready for immediate use. Electronically Signed   By: Sandi Mariscal M.D.   On: 10/12/2020 14:41   Physical Exam: Vitals:   10/12/20 0720 10/12/20 0825 10/12/20 0904 10/12/20 1249  BP:  (!) 151/105  121/72  Pulse:  82  70  Resp:    18  Temp: 98.4 F (36.9 C) 97.7 F (36.5 C)  97.8 F (36.6 C)  TempSrc: Oral Oral  Oral  SpO2:  97%  97%  Weight:   98.1 kg      General: WDWN NAD Head: NCAT sclera not icteric MMM Neck: Supple. No lymphadenopathy Lungs: CTA bilaterally. No wheeze, rales or rhonchi. Breathing is unlabored. Heart: RRR. No murmur, rubs or gallops.  Abdomen: soft, nontender, +BS Lower extremities:no edema bilateral lower extremities Neuro: AAOx3. Moves all extremities spontaneously. Psych:  Responds to questions appropriately with a normal affect. Dialysis Access: L TDC  Dialysis Orders:  TTS - [place]  4hrs/4mn, BFR 400, DFR 500,  EDW 93kg, 2K/ 2Ca  Access: L TDC Heparin 2000 units every treatment Mircera 100 mcg q2wks - last 10/02/20 Venofer '50mg'$  every week-last 10/04/20 Home meds:  Last Labs: Hgb 10.2, K 5.1, Ca 9.2,   Assessment/Plan: 1. Complication associated with HD catheter: Fluoroscopic guided exchange via IR performed today.  2. ESRD - on HD TTS-Plan for HD tomorrow first case 3. Hypertension/volume  -  CXR showed mild overload;  patient appears euvolemic on exam; no signs of volume overload; BP stable-on midodrine. 4. Anemia of CKD - Hgb 10.2- will monitor trend 5. Secondary Hyperparathyroidism - Ca 9.2, currently not on VDRA in outpatient-monitor trend. 6. Nutrition - Renal diet with fluid restriction  Tobie Poet, NP [facility] 10/12/2020, 5:20 PM

## 2020-10-12 NOTE — Progress Notes (Signed)
NEW ADMISSION NOTE New Admission Note:   Arrival Method:  Mental Orientation: Alert and Oriented Telemetry: Yes Assessment: Completed Skin: Intact IV: LFA Pain: None Tubes: See flowsheet Safety Measures: Safety Fall Prevention Plan has been given, discussed and signed Admission: Completed [facility] Orientation: Patient has been orientated to the room, unit and staff.  Family: None Present  Orders have been reviewed and implemented. Will continue to monitor the patient. Call light has been placed within reach and bed alarm has been activated.   Mikki Santee, RN

## 2020-10-12 NOTE — Progress Notes (Addendum)
PROGRESS NOTE  Wanda Ramirez [id number] DOB: 05-Feb-1971 DOA: 10/11/2020 PCP: System, Provider Not In  HPI/Recap of past 24 hours: HPI from Dr Hal Hope [name] is a 50 y.o. female with history of ESRD on HD T/T/S, DM2, has had recent dialysis catheter placed and had gone to dialysis center 10/11/20 and was told that the catheter was not functioning and was instructed to come to the ER.  Of note, patient has had 3 dialysis catheter placed in the last 10 days with incomplete dialysis sessions due to malfunctioning catheter. Denies any chest pain or shortness of breath. In the ER chest x-ray shows mild fluid overload, labs are largely at baseline with hemoglobin 10.4 COVID test negative blood sugar 283 creatinine 13.8 and potassium 4.8 bicarb of 21.  ER physician discussed with on-call nephrologist who advised patient to be admitted for observation and getting interventional radiology for replacement of the catheter.  IR consulted.  Patient admitted for further management.    Today, patient denies any new complaints, denies any chest pain, shortness of breath, abdominal pain, nausea/vomiting, fever/chills.  Discussed with sister over the phone.    Assessment/Plan: Principal Problem:   Complication associated with dialysis catheter Active Problems:   End stage renal disease on dialysis ([place])   Diabetes mellitus type 2 in obese [facility])   Essential hypertension   Hypothyroidism   GERD (gastroesophageal reflux disease)   OSA (obstructive sleep apnea)   ESRD on T/TH/S Malfunctioning dialysis catheter History as noted as above Nephrology consulted for further management IR consulted for possible replacement of HD catheter Patient does have an AV fistula in the right upper extremity which was recently placed Daily renal labs  Leukocytosis Currently afebrile, unknown etiology Daily CBC, monitor closely  Chronic hypotension Continue midodrine  Anemia of chronic kidney  disease Hemoglobin around baseline Daily CBC  Diabetes mellitus type 2, with neuropathy Last A1c 6.1 on 05/2020 Continue SSI, Accu-Cheks, hypoglycemic protocol Hold Lantus 5 units for now Continue gabapentin  Hypothyroidism Continue Synthroid  Chronic pain syndrome Continue PTA oxycodone  Obesity Lifestyle modification advised Estimated body mass index is 39.54 kg/m as calculated from the following:   Height as of 09/21/20: '5\' 2"'$  (1.575 m).   Weight as of this encounter: 98.1 kg.     Code Status: Full  Family Communication: Discussed with sister over the phone  Disposition Plan: Status is: Observation  The patient remains OBS appropriate and will d/c before 2 midnights.  Dispo: The patient is from: Home              Anticipated d/c is to: Home              Patient currently is not medically stable to d/c.   Difficult to place patient No   Consultants:  IR  Nephrology  Procedures:  None  Antimicrobials:  None  DVT prophylaxis: SCDs for now pending procedure   Objective: Vitals:   10/12/20 0700 10/12/20 0720 10/12/20 0825 10/12/20 0904  BP: 126/81  (!) 151/105   Pulse: 70  82   Resp: 11     Temp:  98.4 F (36.9 C) 97.7 F (36.5 C)   TempSrc:  Oral Oral   SpO2: 100%  97%   Weight:    98.1 kg    Intake/Output Summary (Last 24 hours) at 10/12/2020 1022 Last data filed at 10/12/2020 0904 Gross per 24 hour  Intake 75 ml  Output --  Net 75 ml   [employer]  10/12/20 0904  Weight: 98.1 kg    Exam:  General: NAD   Cardiovascular: S1, S2 present  Respiratory: CTAB  Abdomen: Soft, nontender, nondistended, bowel sounds present  Musculoskeletal: No bilateral pedal edema noted  Skin:  Noted wound on buttocks  Psychiatry: Normal mood    Data Reviewed: CBC: Recent Labs  Lab 10/11/20 1516 10/12/20 0225  WBC 9.9 12.0*  NEUTROABS 6.7  --   HGB 10.4* 10.2*  HCT 31.0* 30.5*  MCV 86.8 88.2  PLT 311 [id number]   Basic Metabolic  Panel: Recent Labs  Lab 10/11/20 1516 10/12/20 0225  NA 135 138  K 4.8 5.1  CL 98 99  CO2 21* 21*  GLUCOSE 283* 137*  BUN 68* 73*  CREATININE 13.81* 14.68*  CALCIUM 9.1 9.2   GFR: Estimated Creatinine Clearance: 5.1 mL/min (A) (by C-G formula based on SCr of 14.68 mg/dL (H)). Liver Function Tests: No results for input(s): AST, ALT, ALKPHOS, BILITOT, PROT, ALBUMIN in the last 168 hours. No results for input(s): LIPASE, AMYLASE in the last 168 hours. No results for input(s): AMMONIA in the last 168 hours. Coagulation Profile: No results for input(s): INR, PROTIME in the last 168 hours. Cardiac Enzymes: No results for input(s): CKTOTAL, CKMB, CKMBINDEX, TROPONINI in the last 168 hours. BNP (last 3 results) No results for input(s): PROBNP in the last 8760 hours. HbA1C: No results for input(s): HGBA1C in the last 72 hours. CBG: Recent Labs  Lab 10/12/20 0832  GLUCAP 156*   Lipid Profile: No results for input(s): CHOL, HDL, LDLCALC, TRIG, CHOLHDL, LDLDIRECT in the last 72 hours. Thyroid Function Tests: No results for input(s): TSH, T4TOTAL, FREET4, T3FREE, THYROIDAB in the last 72 hours. Anemia Panel: No results for input(s): VITAMINB12, FOLATE, FERRITIN, TIBC, IRON, RETICCTPCT in the last 72 hours. Urine analysis:    Component Value Date/Time   COLORURINE AMBER (A) 05/18/2020 1727   APPEARANCEUR CLOUDY (A) 05/18/2020 1727   LABSPEC 1.015 05/18/2020 1727   PHURINE 6.0 05/18/2020 1727   GLUCOSEU 150 (A) 05/18/2020 1727   HGBUR NEGATIVE 05/18/2020 1727   BILIRUBINUR NEGATIVE 05/18/2020 1727   KETONESUR NEGATIVE 05/18/2020 1727   PROTEINUR 100 (A) 05/18/2020 1727   NITRITE NEGATIVE 05/18/2020 1727   LEUKOCYTESUR LARGE (A) 05/18/2020 1727   Sepsis Labs: '@LABRCNTIP'$ (procalcitonin:4,lacticidven:4)  ) Recent Results (from the past 240 hour(s))  Resp Panel by RT-PCR (Flu A&B, Covid) Nasopharyngeal Swab     Status: None   Collection Time: 10/11/20  3:17 PM   Specimen:  Nasopharyngeal Swab; Nasopharyngeal(NP) swabs in vial transport medium  Result Value Ref Range Status   SARS Coronavirus 2 by RT PCR NEGATIVE NEGATIVE Final    Comment: (NOTE) SARS-CoV-2 target nucleic acids are NOT DETECTED.  The SARS-CoV-2 RNA is generally detectable in upper respiratory specimens during the acute phase of infection. The lowest concentration of SARS-CoV-2 viral copies this assay can detect is 138 copies/mL. A negative result does not preclude SARS-Cov-2 infection and should not be used as the sole basis for treatment or other patient management decisions. A negative result may occur with  improper specimen collection/handling, submission of specimen other than nasopharyngeal swab, presence of viral mutation(s) within the areas targeted by this assay, and inadequate number of viral copies(<138 copies/mL). A negative result must be combined with clinical observations, patient history, and epidemiological information. The expected result is Negative.  Fact Sheet for Patients:  EntrepreneurPulse.com.au  Fact Sheet for Healthcare Providers:  IncredibleEmployment.be  This test is no t yet approved or cleared  by the [place] and  has been authorized for detection and/or diagnosis of SARS-CoV-2 by FDA under an Emergency Use Authorization (EUA). This EUA will remain  in effect (meaning this test can be used) for the duration of the COVID-19 declaration under Section 564(b)(1) of the Act, 21 U.S.C.section 360bbb-3(b)(1), unless the authorization is terminated  or revoked sooner.       Influenza A by PCR NEGATIVE NEGATIVE Final   Influenza B by PCR NEGATIVE NEGATIVE Final    Comment: (NOTE) The Xpert Xpress SARS-CoV-2/FLU/RSV plus assay is intended as an aid in the diagnosis of influenza from Nasopharyngeal swab specimens and should not be used as a sole basis for treatment. Nasal washings and aspirates are unacceptable for  Xpert Xpress SARS-CoV-2/FLU/RSV testing.  Fact Sheet for Patients: EntrepreneurPulse.com.au  Fact Sheet for Healthcare Providers: IncredibleEmployment.be  This test is not yet approved or cleared by the [place] FDA and has been authorized for detection and/or diagnosis of SARS-CoV-2 by FDA under an Emergency Use Authorization (EUA). This EUA will remain in effect (meaning this test can be used) for the duration of the COVID-19 declaration under Section 564(b)(1) of the Act, 21 U.S.C. section 360bbb-3(b)(1), unless the authorization is terminated or revoked.  Performed at [facility], [place] [address], [place], [place] [address]       Studies: DG Chest 2 View  Result Date: 10/11/2020 CLINICAL DATA:  Nonfunctioning dialysis catheter with need for dialysis. EXAM: CHEST - 2 VIEW COMPARISON:  Chest x-ray from 05/17/2020, follow-up films from prior catheter revision FINDINGS: Cardiac shadow is stable left dialysis catheter has withdrawn in the interval from the prior exam with the tip at the level of the mid superior vena cava. A previously was noted in the superior aspect of the right atrium. Mild vascular congestion is noted consistent with volume overload. No effusions are seen. No infiltrate is noted. Chronic scarring is noted bilaterally. IMPRESSION: Mild volume overload consistent with missed dialysis therapy. Dialysis catheter has withdrawn as described above. Electronically Signed   By: Inez Catalina M.D.   On: 10/11/2020 15:53    Scheduled Meds: . gabapentin  100 mg Oral BID  . insulin aspart  0-6 Units Subcutaneous TID WC  . levothyroxine  125 mcg Oral Q0600  . midodrine  10 mg Oral TID WC  . pantoprazole  40 mg Oral Q1200  . QUEtiapine  50 mg Oral QHS    Continuous Infusions:   LOS: 0 days     Alma Friendly, MD [facility]  If 7PM-7AM, please contact night-coverage [email or web address] 10/12/2020, 10:22 AM

## 2020-10-12 NOTE — H&P (Addendum)
History and Physical    Wanda Ramirez [id number] DOB: 12/30/1970 DOA: 10/11/2020  PCP: System, Provider Not In  Patient coming from: Home.  Chief Complaint: Dialysis catheter nonfunctioning.  HPI: Wanda Ramirez is a 49 y.o. female with history of ESRD on hemodialysis on Tuesday Thursdays and Saturday, diabetes mellitus type 2 has had recent dialysis catheter placed and had gone to dialysis center yesterday and was told that the catheter was not functioning and was instructed to come to the ER.  Last dialysis was 3 days ago.  Has had another catheter replaced last week.  Denies any chest pain or shortness of breath.  ED Course: In the ER chest x-ray shows mild fluid overload labs are largely at baseline with hemoglobin 10.4 COVID test negative blood sugar 283 creatinine 13.8 and potassium 4.8 bicarb of 21.  ER physician I discussed with on-call nephrologist who advised patient to be admitted for observation and getting interventional radiology for replacement of the catheter.  Review of Systems: As per HPI, rest all negative.   Past Medical History:  Diagnosis Date  . Anemia   . Anxiety   . Arthritis   . Asthma   . Chronic back pain   . Diabetes mellitus ([place])    Type II  . ESRD (end stage renal disease) on dialysis ([place])    TTS- [facility] since 2016  . Essential hypertension   . GERD (gastroesophageal reflux disease)   . Glaucoma   . History of blood transfusion   . Hyperlipidemia   . Hypothyroidism   . Morbid obesity ([place])   . Peripheral neuropathy   . Sleep apnea    wears CPAP, does not know setting    Past Surgical History:  Procedure Laterality Date  . AV FISTULA PLACEMENT Left 04/2014  . AV FISTULA PLACEMENT Right 08/15/2020   Procedure: RIGHT BRACHIOCEPHALIC ARTERIOVENOUS (AV) FISTULA CREATION;  Surgeon: Waynetta Sandy, MD;  Location: [facility];  Service: Vascular;  Laterality: Right;  . CARDIAC CATHETERIZATION N/A 03/17/2016    Procedure: Left Heart Cath and Coronary Angiography;  Surgeon: [name], MD;  Location: [facility];  Service: Cardiovascular;  Laterality: N/A;  . INCISION AND DRAINAGE PERIRECTAL ABSCESS N/A 04/03/2020   Procedure: EXCISIONAL DEBRIDEMENT OF SACRAL AND GLUTEAL WOUNDS;  Surgeon: [name], MD;  Location: [facility];  Service: General;  Laterality: N/A;  . IR FLUORO GUIDE CV LINE LEFT  02/22/2020  . IR FLUORO GUIDE CV LINE LEFT  02/27/2020  . IR FLUORO GUIDE CV LINE LEFT  05/30/2020  . IR FLUORO GUIDE CV LINE LEFT  10/10/2020  . IR US GUIDE VASC ACCESS LEFT  02/22/2020  . KNEE SURGERY    . TUBAL LIGATION       reports that she has quit smoking. She has never used smokeless tobacco. She reports that she does not drink alcohol and does not use drugs.  Allergies  Allergen Reactions  . Hydrocodone Itching and Nausea And Vomiting    Family History  Problem Relation Age of Onset  . Diabetes Mother   . Hyperlipidemia Mother   . Kidney disease Father     Prior to Admission medications   Medication Sig Start Date End Date Taking? Authorizing Provider  acetaminophen (TYLENOL) 325 MG tablet Take 1-2 tablets (325-650 mg total) by mouth every 4 (four) hours as needed for mild pain. 05/29/20   Love, Ivan Anchors, PA-C  albuterol (VENTOLIN HFA) 108 (90 Base) MCG/ACT inhaler Inhale  1-2 puffs into the lungs every 6 (six) hours as needed for wheezing or shortness of breath.    [provider]  aspirin EC 81 MG tablet Take 81 mg by mouth daily. Swallow whole.    [provider]  AURYXIA 1 GM 210 MG(Fe) tablet Take 630 mg by mouth 3 (three) times daily with meals. 06/21/20   [provider]  B Complex-C (B-COMPLEX WITH VITAMIN C) tablet Take 1 tablet by mouth daily. 05/16/20   Bloomfield, Carley D, DO  brimonidine (ALPHAGAN) 0.2 % ophthalmic solution Place 1 drop into both eyes 3 (three) times daily. Patient taking differently: Place 1 drop into both eyes in the  morning and at bedtime. 05/15/20   Bloomfield, Carley D, DO  brinzolamide (AZOPT) 1 % ophthalmic suspension Place 1 drop into both eyes 3 (three) times daily. 05/15/20   Bloomfield, Carley D, DO  cinacalcet (SENSIPAR) 60 MG tablet Take 60 mg by mouth every other day.    [provider]  clonazePAM (KLONOPIN) 0.5 MG tablet Take 1/2 tablet (0.'25mg'$ ) po daily Patient taking differently: Take 0.25 mg by mouth daily. 05/30/20   Love, Ivan Anchors, PA-C  gabapentin (NEURONTIN) 100 MG capsule Take 100 mg by mouth 2 (two) times daily.    [provider]  hydrocerin (EUCERIN) CREA Apply 1 application topically daily. 05/15/20   Bloomfield, Carley D, DO  insulin aspart (NOVOLOG) 100 UNIT/ML FlexPen Inject 2 Units into the skin 3 (three) times daily with meals as needed for high blood sugar (Give 2 units of apart with each meal if patient is likely to eat 50% or more of her meal and BS >80). Patient taking differently: Inject 2-8 Units into the skin 3 (three) times daily with meals. Sliding scale 05/30/20   Love, Ivan Anchors, PA-C  insulin glargine (LANTUS) 100 unit/mL SOPN Inject 4 Units into the skin at bedtime. 05/30/20   Love, Ivan Anchors, PA-C  latanoprost (XALATAN) 0.005 % ophthalmic solution Place 1 drop into both eyes in the morning. 05/16/20   Bloomfield, Nila Nephew D, DO  levothyroxine (SYNTHROID) 125 MCG tablet Take 1 tablet (125 mcg total) by mouth daily before breakfast. 05/29/20   Love, Ivan Anchors, PA-C  lip balm (CARMEX) ointment Apply topically as needed for lip care. 05/15/20   Bloomfield, Carley D, DO  midodrine (PROAMATINE) 10 MG tablet Take 1 tablet (10 mg total) by mouth 3 (three) times daily with meals. Patient taking differently: Take 10 mg by mouth 3 (three) times daily as needed (low blood pressure). 05/29/20   Love, Ivan Anchors, PA-C  montelukast (SINGULAIR) 10 MG tablet Take 10 mg by mouth at bedtime.    [provider]  multivitamin (RENA-VIT) TABS tablet Take 1 tablet by  mouth at bedtime. 05/29/20   Love, Ivan Anchors, PA-C  Netarsudil Dimesylate (RHOPRESSA) 0.02 % SOLN Place 1 drop into both eyes at bedtime.    [provider]  nutrition supplement, JUVEN, (JUVEN) PACK Take 1 packet by mouth 2 (two) times daily between meals. Patient not taking: Reported on 08/02/2020 05/29/20   Love, Ivan Anchors, PA-C  Oxycodone HCl 10 MG TABS Take 10 mg by mouth every 6 (six) hours as needed (pain). 07/26/20   [provider]  pantoprazole (PROTONIX) 40 MG tablet Take 1 tablet (40 mg total) by mouth daily. 05/29/20   Love, Ivan Anchors, PA-C  polyethylene glycol (MIRALAX / GLYCOLAX) 17 g packet Take 17 g by mouth daily as needed for mild constipation. 05/15/20  Bloomfield, Carley D, DO  QUEtiapine (SEROQUEL) 50 MG tablet Take 1 tablet (50 mg total) by mouth at bedtime. 05/29/20   Bary Leriche, PA-C    Physical Exam: Constitutional: Moderately built and nourished. Vitals:   10/12/20 0000 10/12/20 0015 10/12/20 0030 10/12/20 0045  BP: 136/79 122/81 126/81   Pulse: 72 69 81 79  Resp:    13  Temp:      TempSrc:      SpO2: 100% 100% 100% 100%   Eyes: Anicteric no pallor. ENMT: No discharge from the ears eyes nose or mouth. Neck: No mass felt.  No neck rigidity. Respiratory: No rhonchi or crepitations. Cardiovascular: S1-S2 heard. Abdomen: Soft nontender bowel sound present. Musculoskeletal: No edema. Skin: No rash. Neurologic: Alert awake oriented to time place and person.  Moves all extremities. Psychiatric: Appears normal.  Normal affect.   Labs on Admission: I have personally reviewed following labs and imaging studies  CBC: Recent Labs  Lab 10/11/20 1516  WBC 9.9  NEUTROABS 6.7  HGB 10.4*  HCT 31.0*  MCV 86.8  PLT [id number]   Basic Metabolic Panel: Recent Labs  Lab 10/11/20 1516  NA 135  K 4.8  CL 98  CO2 21*  GLUCOSE 283*  BUN 68*  CREATININE 13.81*  CALCIUM 9.1   GFR: CrCl cannot be calculated (Unknown ideal weight.). Liver Function  Tests: No results for input(s): AST, ALT, ALKPHOS, BILITOT, PROT, ALBUMIN in the last 168 hours. No results for input(s): LIPASE, AMYLASE in the last 168 hours. No results for input(s): AMMONIA in the last 168 hours. Coagulation Profile: No results for input(s): INR, PROTIME in the last 168 hours. Cardiac Enzymes: No results for input(s): CKTOTAL, CKMB, CKMBINDEX, TROPONINI in the last 168 hours. BNP (last 3 results) No results for input(s): PROBNP in the last 8760 hours. HbA1C: No results for input(s): HGBA1C in the last 72 hours. CBG: No results for input(s): GLUCAP in the last 168 hours. Lipid Profile: No results for input(s): CHOL, HDL, LDLCALC, TRIG, CHOLHDL, LDLDIRECT in the last 72 hours. Thyroid Function Tests: No results for input(s): TSH, T4TOTAL, FREET4, T3FREE, THYROIDAB in the last 72 hours. Anemia Panel: No results for input(s): VITAMINB12, FOLATE, FERRITIN, TIBC, IRON, RETICCTPCT in the last 72 hours. Urine analysis:    Component Value Date/Time   COLORURINE AMBER (A) 05/18/2020 1727   APPEARANCEUR CLOUDY (A) 05/18/2020 1727   LABSPEC 1.015 05/18/2020 1727   PHURINE 6.0 05/18/2020 1727   GLUCOSEU 150 (A) 05/18/2020 1727   HGBUR NEGATIVE 05/18/2020 1727   BILIRUBINUR NEGATIVE 05/18/2020 1727   KETONESUR NEGATIVE 05/18/2020 1727   PROTEINUR 100 (A) 05/18/2020 1727   NITRITE NEGATIVE 05/18/2020 1727   LEUKOCYTESUR LARGE (A) 05/18/2020 1727   Sepsis Labs: '@LABRCNTIP'$ (procalcitonin:4,lacticidven:4) ) Recent Results (from the past 240 hour(s))  Resp Panel by RT-PCR (Flu A&B, Covid) Nasopharyngeal Swab     Status: None   Collection Time: 10/11/20  3:17 PM   Specimen: Nasopharyngeal Swab; Nasopharyngeal(NP) swabs in vial transport medium  Result Value Ref Range Status   SARS Coronavirus 2 by RT PCR NEGATIVE NEGATIVE Final    Comment: (NOTE) SARS-CoV-2 target nucleic acids are NOT DETECTED.  The SARS-CoV-2 RNA is generally detectable in upper respiratory specimens  during the acute phase of infection. The lowest concentration of SARS-CoV-2 viral copies this assay can detect is 138 copies/mL. A negative result does not preclude SARS-Cov-2 infection and should not be used as the sole basis for treatment or other patient management decisions. A  negative result may occur with  improper specimen collection/handling, submission of specimen other than nasopharyngeal swab, presence of viral mutation(s) within the areas targeted by this assay, and inadequate number of viral copies(<138 copies/mL). A negative result must be combined with clinical observations, patient history, and epidemiological information. The expected result is Negative.  Fact Sheet for Patients:  EntrepreneurPulse.com.au  Fact Sheet for Healthcare Providers:  IncredibleEmployment.be  This test is no t yet approved or cleared by the [place] FDA and  has been authorized for detection and/or diagnosis of SARS-CoV-2 by FDA under an Emergency Use Authorization (EUA). This EUA will remain  in effect (meaning this test can be used) for the duration of the COVID-19 declaration under Section 564(b)(1) of the Act, 21 U.S.C.section 360bbb-3(b)(1), unless the authorization is terminated  or revoked sooner.       Influenza A by PCR NEGATIVE NEGATIVE Final   Influenza B by PCR NEGATIVE NEGATIVE Final    Comment: (NOTE) The Xpert Xpress SARS-CoV-2/FLU/RSV plus assay is intended as an aid in the diagnosis of influenza from Nasopharyngeal swab specimens and should not be used as a sole basis for treatment. Nasal washings and aspirates are unacceptable for Xpert Xpress SARS-CoV-2/FLU/RSV testing.  Fact Sheet for Patients: EntrepreneurPulse.com.au  Fact Sheet for Healthcare Providers: IncredibleEmployment.be  This test is not yet approved or cleared by the [place] FDA and has been authorized for detection  and/or diagnosis of SARS-CoV-2 by FDA under an Emergency Use Authorization (EUA). This EUA will remain in effect (meaning this test can be used) for the duration of the COVID-19 declaration under Section 564(b)(1) of the Act, 21 U.S.C. section 360bbb-3(b)(1), unless the authorization is terminated or revoked.  Performed at [facility], [place] [address], [place], [place] [address]      Radiological Exams on Admission: DG Chest 2 View  Result Date: 10/11/2020 CLINICAL DATA:  Nonfunctioning dialysis catheter with need for dialysis. EXAM: CHEST - 2 VIEW COMPARISON:  Chest x-ray from 05/17/2020, follow-up films from prior catheter revision FINDINGS: Cardiac shadow is stable left dialysis catheter has withdrawn in the interval from the prior exam with the tip at the level of the mid superior vena cava. A previously was noted in the superior aspect of the right atrium. Mild vascular congestion is noted consistent with volume overload. No effusions are seen. No infiltrate is noted. Chronic scarring is noted bilaterally. IMPRESSION: Mild volume overload consistent with missed dialysis therapy. Dialysis catheter has withdrawn as described above. Electronically Signed   By: Inez Catalina M.D.   On: 10/11/2020 15:53   IR Fluoro Guide CV Line Left  Result Date: 10/10/2020 INDICATION: 50 year old female referred for nonfunctional left IJ hemodialysis catheter EXAM: IMAGE GUIDED EXCHANGE OF NONFUNCTIONAL LEFT TUNNELED IJ HEMODIALYSIS CATHETER MEDICATIONS: None ANESTHESIA/SEDATION: None FLUOROSCOPY TIME:  Fluoroscopy Time: 0 minutes 42 seconds (6 mGy). COMPLICATIONS: None PROCEDURE: Informed written consent was obtained from the patient after a discussion of the risks, benefits, and alternatives to treatment. Questions regarding the procedure were encouraged and answered. The left neck and chest, including the indwelling catheter, were prepped with chlorhexidine in a sterile fashion, and a sterile drape was  applied covering the operative field. Maximum barrier sterile technique with sterile gowns and gloves were used for the procedure. A timeout was performed prior to the initiation of the procedure. The hub a both the red and blue port were aspirated, achieving blood return. 1% lidocaine was used for local anesthesia. Blunt dissection was used to free  the catheter. A stiff Glidewire was then advanced through the red hub, observed to enter the IVC under fluoroscopy. Catheter was removed from the [place]. A new 28 cm tip to cuff catheter was placed on the Glidewire. After removal of the coaxial introducers, image was performed. This revealed that the catheter tip was projecting over the lower cavoatrial junction near the diaphragm. Glidewire was then advanced to the IVC. We then selected a 23 cm tip to cuff catheter. This catheter was advanced on the Glidewire. The wire was removed and the introducers were removed. Aspiration of both red and blue hubs was achieved. Sterile saline flush was then pushed through both hubs. Hep-Lock was then placed. Catheter was sutured in position and a final image was stored. Patient tolerated the procedure well and remained hemodynamically stable throughout. No complications were encountered and no significant blood loss. IMPRESSION: Status post routine exchange of malpositioned/nonfunctional left IJ tunneled IJ hemodialysis catheter, with a new 23 cm tip to cuff catheter placed. Signed, Dulcy Fanny. Dellia Nims, RPVI [facility] Electronically Signed   By: Corrie Mckusick D.O.   On: 10/10/2020 16:10     Assessment/Plan Principal Problem:   Complication associated with dialysis catheter Active Problems:   End stage renal disease on dialysis [facility])   Diabetes mellitus type 2 in obese [facility])   Essential hypertension   Hypothyroidism   GERD (gastroesophageal reflux disease)   OSA (obstructive sleep apnea)    1. Dialysis  catheter at this time is nonfunctioning for which we will consult interventional radiology as requested by nephrology.  Patient does have AV fistula in the right upper extremity which was recently placed.  Further recommendations per nephrology. 2. Diabetes mellitus type 2 we will keep patient on sliding scale coverage.  Takes Lantus and in the morning. 3. Hypothyroidism on Synthroid. 4. Hypotension on midodrine. 5. Anemia secondary to ESRD follow CBC. 6. Chronic pain on oxycodone.   DVT prophylaxis: SCDs for now in anticipation of procedure will hold anticoagulation. Code Status: Full code. Family Communication: Discussed with patient. Disposition Plan: Home. Consults called: ER physician discussed with nephrologist. Admission status: Observation.   Rise Patience MD [facility] Pager [phone].  If 7PM-7AM, please contact night-coverage [email or web address] Password [email or web address]  10/12/2020, 1:23 AM

## 2020-10-12 NOTE — ED Notes (Signed)
Attempted to call report

## 2020-10-12 NOTE — Consult Note (Addendum)
[facility] Nurse Consult Note: Reason for Consult: Consult requested for chronic wound.  Pt is familiar to [facility] team from previous visit in Dec.  She had a full thickness wound which was surgically debrided at that time and had a Vac placed.  The location is almost completely healed at this time; but a shallow chronic wound remains located deep in the left buttock fold and it will be difficult to close completely due to the location and constant moisture in the site.  Wound type: Full thickness wound to left gluteal/buttock fold 6X1X.1cm, red and moist, mod amt tan drainage, no odor. White macerated skin surrounding the wound bed related to moisture. Dressing procedure/placement/frequency: Pt states she has been packing the location with moist gauze, but I feel this is causing too much moisture to remain in place over the wound bed. Topical treatment orders provided for bedside nurses to perform to absorb drainage and promote healing as follows: Tuck piece of [place] Kellie Simmering # [phone]) into abd wound in [place] Q day, then cover with foam dressing.  (Change foam dressing Q 3 days or PRN soiling.) Please re-consult if further assistance is needed.  Thank-you,  Julien Girt MSN, [place], [place], [place], [place]

## 2020-10-13 DIAGNOSIS — T829XXA Unspecified complication of cardiac and vascular prosthetic device, implant and graft, initial encounter: Secondary | ICD-10-CM | POA: Diagnosis not present

## 2020-10-13 DIAGNOSIS — E1169 Type 2 diabetes mellitus with other specified complication: Secondary | ICD-10-CM | POA: Diagnosis not present

## 2020-10-13 DIAGNOSIS — I10 Essential (primary) hypertension: Secondary | ICD-10-CM | POA: Diagnosis not present

## 2020-10-13 DIAGNOSIS — E669 Obesity, unspecified: Secondary | ICD-10-CM

## 2020-10-13 DIAGNOSIS — G4733 Obstructive sleep apnea (adult) (pediatric): Secondary | ICD-10-CM

## 2020-10-13 DIAGNOSIS — Z992 Dependence on renal dialysis: Secondary | ICD-10-CM

## 2020-10-13 DIAGNOSIS — N186 End stage renal disease: Secondary | ICD-10-CM

## 2020-10-13 DIAGNOSIS — E039 Hypothyroidism, unspecified: Secondary | ICD-10-CM

## 2020-10-13 LAB — CBC WITH DIFFERENTIAL/PLATELET
Abs Immature Granulocytes: 0.05 10*3/uL (ref 0.00–0.07)
Basophils Absolute: 0.1 10*3/uL (ref 0.0–0.1)
Basophils Relative: 1 %
Eosinophils Absolute: 0.7 10*3/uL — ABNORMAL HIGH (ref 0.0–0.5)
Eosinophils Relative: 7 %
HCT: 28.4 % — ABNORMAL LOW (ref 36.0–46.0)
Hemoglobin: 9.5 g/dL — ABNORMAL LOW (ref 12.0–15.0)
Immature Granulocytes: 1 %
Lymphocytes Relative: 26 %
Lymphs Abs: 2.7 10*3/uL (ref 0.7–4.0)
MCH: 29 pg (ref 26.0–34.0)
MCHC: 33.5 g/dL (ref 30.0–36.0)
MCV: 86.6 fL (ref 80.0–100.0)
Monocytes Absolute: 0.8 10*3/uL (ref 0.1–1.0)
Monocytes Relative: 8 %
Neutro Abs: 6.1 10*3/uL (ref 1.7–7.7)
Neutrophils Relative %: 57 %
Platelets: 258 10*3/uL (ref 150–400)
RBC: 3.28 MIL/uL — ABNORMAL LOW (ref 3.87–5.11)
RDW: 16.9 % — ABNORMAL HIGH (ref 11.5–15.5)
WBC: 10.4 10*3/uL (ref 4.0–10.5)
nRBC: 0 % (ref 0.0–0.2)

## 2020-10-13 LAB — RENAL FUNCTION PANEL
Albumin: 2.8 g/dL — ABNORMAL LOW (ref 3.5–5.0)
Anion gap: 17 — ABNORMAL HIGH (ref 5–15)
BUN: 89 mg/dL — ABNORMAL HIGH (ref 6–20)
CO2: 19 mmol/L — ABNORMAL LOW (ref 22–32)
Calcium: 8.2 mg/dL — ABNORMAL LOW (ref 8.9–10.3)
Chloride: 100 mmol/L (ref 98–111)
Creatinine, Ser: 16.9 mg/dL — ABNORMAL HIGH (ref 0.44–1.00)
GFR, Estimated: 2 mL/min — ABNORMAL LOW (ref >60)
Glucose, Bld: 132 mg/dL — ABNORMAL HIGH (ref 70–99)
Phosphorus: >30 mg/dL — ABNORMAL HIGH (ref 2.5–4.6)
Potassium: 5.9 mmol/L — ABNORMAL HIGH (ref 3.5–5.1)
Sodium: 136 mmol/L (ref 135–145)

## 2020-10-13 LAB — GLUCOSE, CAPILLARY
Glucose-Capillary: 120 mg/dL — ABNORMAL HIGH (ref 70–99)
Glucose-Capillary: 151 mg/dL — ABNORMAL HIGH (ref 70–99)

## 2020-10-13 MED ORDER — LATANOPROST 0.005 % OP SOLN
1.0000 [drp] | Freq: Every morning | OPHTHALMIC | Status: DC
  Filled 2020-10-13: qty 2.5

## 2020-10-13 MED ORDER — BRINZOLAMIDE 1 % OP SUSP
1.0000 [drp] | Freq: Three times a day (TID) | OPHTHALMIC | Status: DC
  Filled 2020-10-13: qty 10

## 2020-10-13 MED ORDER — HEPARIN SODIUM (PORCINE) 1000 UNIT/ML IJ SOLN
INTRAMUSCULAR | Status: AC
  Administered 2020-10-13: 4200 [IU] via INTRAVENOUS_CENTRAL
  Filled 2020-10-13: qty 5

## 2020-10-13 MED ORDER — BRIMONIDINE TARTRATE 0.2 % OP SOLN
1.0000 [drp] | Freq: Three times a day (TID) | OPHTHALMIC | Status: DC
  Filled 2020-10-13: qty 5

## 2020-10-13 MED ORDER — ONDANSETRON HCL 4 MG PO TABS: 4.0000 mg | ORAL_TABLET | Freq: Three times a day (TID) | ORAL | Status: DC | PRN

## 2020-10-13 NOTE — Progress Notes (Signed)
[facility] Progress Note   Subjective:     Wanda Ramirez was seen and examined while receiving scheduled HD treatment. UFG at 5.5L. Patient with no complaints at this time. Denies SOB, CP, ABD pain, or N/V/D.  Objective Vitals:   10/13/20 0800 10/13/20 0815 10/13/20 0830 10/13/20 0900  BP: (!) 100/58 (!) 85/60 (!) 97/58 (!) 112/48  Pulse: 63 78 74 70  Resp:      Temp:      TempSrc:      SpO2:      Weight:       Physical Exam General: Well appearing; No acute respiratory distress Heart: Normal S1 and S2; No murmur, gallops, or friction rub Lungs: Clear; no wheezing, rales, or rhonchi Abdomen: Large, soft, non-tender, (+) bowel sounds Extremities: No edema bilateral lower extremities Dialysis Access: [facility]  Filed Weights   10/12/20 0904 10/13/20 0735  Weight: 98.1 kg 99.2 kg    Intake/Output Summary (Last 24 hours) at 10/13/2020 0915 Last data filed at 10/13/2020 0600 Gross per 24 hour  Intake 380 ml  Output 1 ml  Net 379 ml    Additional Objective Labs: Basic Metabolic Panel: Recent Labs  Lab 10/11/20 1516 10/12/20 0225 10/13/20 0357  NA 135 138 136  K 4.8 5.1 5.9*  CL 98 99 100  CO2 21* 21* 19*  GLUCOSE 283* 137* 132*  BUN 68* 73* 89*  CREATININE 13.81* 14.68* 16.90*  CALCIUM 9.1 9.2 8.2*  PHOS  --   --  >30.0*   Liver Function Tests: Recent Labs  Lab 10/13/20 0357  ALBUMIN 2.8*   No results for input(s): LIPASE, AMYLASE in the last 168 hours. CBC: Recent Labs  Lab 10/11/20 1516 10/12/20 0225 10/13/20 0357  WBC 9.9 12.0* 10.4  NEUTROABS 6.7  --  6.1  HGB 10.4* 10.2* 9.5*  HCT 31.0* 30.5* 28.4*  MCV 86.8 88.2 86.6  PLT 311 289 258   Blood Culture    Component Value Date/Time   SDES URINE, RANDOM 05/18/2020 1045   SPECREQUEST NONE 05/18/2020 1045   CULT  05/18/2020 1045    NO GROWTH Performed at [facility], [place] [address], [place], [place] [address]    REPTSTATUS 05/20/2020 FINAL 05/18/2020 1045   CBG: Recent  Labs  Lab 10/12/20 0832 10/12/20 1320 10/12/20 1647 10/12/20 2001 10/13/20 0631  GLUCAP 156* 200* 137* 156* 120*   Iron Studies: No results for input(s): IRON, TIBC, TRANSFERRIN, FERRITIN in the last 72 hours. Lab Results  Component Value Date   INR 1.0 04/16/2020   INR 1.2 04/09/2020   INR 1.5 (H) 04/03/2020   Studies/Results: DG Chest 2 View  Result Date: 10/11/2020 CLINICAL DATA:  Nonfunctioning dialysis catheter with need for dialysis. EXAM: CHEST - 2 VIEW COMPARISON:  Chest x-ray from 05/17/2020, follow-up films from prior catheter revision FINDINGS: Cardiac shadow is stable left dialysis catheter has withdrawn in the interval from the prior exam with the tip at the level of the mid superior vena cava. A previously was noted in the superior aspect of the right atrium. Mild vascular congestion is noted consistent with volume overload. No effusions are seen. No infiltrate is noted. Chronic scarring is noted bilaterally. IMPRESSION: Mild volume overload consistent with missed dialysis therapy. Dialysis catheter has withdrawn as described above. Electronically Signed   By: Inez Catalina M.D.   On: 10/11/2020 15:53   IR Fluoro Guide CV Line Left  Result Date: 10/12/2020 INDICATION: History of end-stage renal disease with  chronic left jugular approach dialysis catheter post fluoroscopic guided exchange on 10/10/2020 Unfortunately, despite appropriate position of the dialysis catheter following exchange, the dialysis catheter has retracted to the level of the azygos vein and as such patient presents today for fluoroscopic guided exchange and lengthening of the present 23 cm tip to cuff dialysis catheter to a 28 cm dialysis catheter. EXAM: TUNNELED CENTRAL VENOUS HEMODIALYSIS CATHETER REPLACEMENT WITH FLUOROSCOPIC GUIDANCE MEDICATIONS: None FLUOROSCOPY TIME:  1 minute, 24 seconds (29 mGy) COMPLICATIONS: None immediate. PROCEDURE: Informed written consent was obtained from the patient after a  discussion of the risks, benefits, and alternatives to treatment. Questions regarding the procedure were encouraged and answered. The skin and external portion of the existing hemodialysis catheter was prepped with chlorhexidine in a sterile fashion, and a sterile drape was applied covering the operative field. Maximum barrier sterile technique with sterile gowns and gloves were used for the procedure. A timeout was performed prior to the initiation of the procedure. Preprocedural spot fluoroscopic image was obtained demonstrating retraction of the recently exchanged dialysis catheter to the level of the azygos vein. Both lumens of the hemodialysis catheter were cannulated with a stiff Glidewire and advanced to the level of the main pulmonary artery. Under intermittent fluoroscopic guidance, the existing dialysis catheter was exchanged for a new, slightly larger now 28 cm tip to cuff palindrome dialysis catheter with tips ultimately terminating within the superior aspect of the right atrium. Final catheter positioning was confirmed and documented with a spot radiographic image. The catheter aspirates and flushes normally. The catheter was flushed with appropriate volume heparin dwells. The catheter exit site was secured with a 0-Prolene retention sutures. Both lumens were heparinized. A dressing was placed. The patient tolerated the procedure well without immediate post procedural complication. IMPRESSION: Successful replacement of 28 cm tip to cuff tunneled hemodialysis catheter with tips terminating within the right atrium. The catheter is ready for immediate use. Electronically Signed   By: Sandi Mariscal M.D.   On: 10/12/2020 14:41    Medications: . sodium chloride    . sodium chloride     . Chlorhexidine Gluconate Cloth  6 each Topical Q0600  . gabapentin  100 mg Oral BID  . insulin aspart  0-6 Units Subcutaneous TID WC  . levothyroxine  125 mcg Oral Q0600  . midodrine  10 mg Oral TID WC  . montelukast   10 mg Oral QHS  . pantoprazole  40 mg Oral Q1200  . QUEtiapine  50 mg Oral QHS    Dialysis Orders: TTS - [place]  4hrs/78mn, BFR 400, DFR 500,  EDW 93kg, 2K/ 2Ca  Access: L TDC Heparin 2000 units every treatment Mircera 100 mcg q2wks - last 10/02/20 Venofer '50mg'$  every week-last 10/04/20 Home meds: Auryxia '210mg'$  3 tabs TID with meals  Assessment/Plan: 1. Complication associated with HD catheter: Fluoroscopic guided exchange via IR performed on 10/12/20-[facility]  2. ESRD - on HD TTS-On HD with UFG 5.5L-will monitor  3. Hypertension/volume  - Patient over EDW by 6L-in setting of missed HD treatment. Currently not in acute respiratory distress-On HD-BP soft but stable-on midodrine.  4. Anemia of CKD - Hgb 9.5- last Micera dose given 10/02/20-will monitor trend-may need to resume. 5. Secondary Hyperparathyroidism - Corr Ca ok, currently not on VDRA in outpatient-monitor trend. 6. Nutrition - Renal diet with fluid restriction  CTobie Poet NP [place]5/14/2022,9:15 AM  LOS: 1 day

## 2020-10-13 NOTE — Discharge Summary (Signed)
Discharge Summary  Wanda Ramirez [id number] DOB: July 20, 1970  PCP: System, Provider Not In  Admit date: 10/11/2020 Discharge date: 10/13/2020  Time spent: 40 mins  Recommendations for Outpatient Follow-up:  1. PCP in 1 week 2. Nephrology as scheduled    Discharge Diagnoses:  Active Hospital Problems   Diagnosis Date Noted  . Complication associated with dialysis catheter 10/12/2020  . End stage renal disease on dialysis ([place]) 03/16/2016  . Diabetes mellitus type 2 in obese ([place]) 03/16/2016  . Essential hypertension 03/16/2016  . Hypothyroidism 03/16/2016  . GERD (gastroesophageal reflux disease) 03/16/2016  . OSA (obstructive sleep apnea) 03/16/2016    Resolved Hospital Problems  No resolved problems to display.    Discharge Condition: Stable  Diet recommendation: Renal diet  Vitals:   10/13/20 1146 10/13/20 1231  BP: (!) 100/50 121/82  Pulse: 76 75  Resp: 13 18  Temp: 97.9 F (36.6 C) 98.4 F (36.9 C)  SpO2: 97% 100%    History of present illness:  Wanda L Alstonis a 50 y.o.femalewithhistory of ESRD on HD T/T/S, DM2, has had recent dialysis catheter placed and had gone to dialysis center 10/11/20 and was told that the catheter was not functioning and was instructed to come to the ER.  Of note, patient has had 3 dialysis catheter placed in the last 10 days with incomplete dialysis sessions due to malfunctioning catheter.Denies any chest pain or shortness of breath. In the ER chest x-ray shows mild fluid overload, labs are largely at baseline with hemoglobin 10.4 COVID test negative blood sugar 283 creatinine 13.8 and potassium 4.8 bicarb of 21. ER physician discussed with on-call nephrologist who advised patient to be admitted for observation and getting interventional radiology for replacement of the catheter.  IR consulted.  Patient admitted for further management.    Today, patient denies any new complaints, denies any chest pain, shortness of breath,  abdominal pain, nausea/vomiting, fever/chills.    Hospital Course:  Principal Problem:   Complication associated with dialysis catheter Active Problems:   End stage renal disease on dialysis [facility])   Diabetes mellitus type 2 in obese [facility])   Essential hypertension   Hypothyroidism   GERD (gastroesophageal reflux disease)   OSA (obstructive sleep apnea)   ESRD on T/TH/S Malfunctioning dialysis catheter s/p fluoroscopy-guided exchange by IR on 10/12/2020 History as noted as above Nephrology consulted-had dialysis on 10/13/2020, okay to discharge from their standpoint IR consulted for possible replacement of HD catheter Patient does have an AV fistula in the right upper extremity which was recently placed Outpatient follow-up  Leukocytosis Resolved Currently afebrile  Chronic hypotension Continue midodrine  Anemia of chronic kidney disease Hemoglobin around baseline  Diabetes mellitus type 2, with neuropathy Last A1c 6.1 on 05/2020 Continue home regimen, gabapentin  Hypothyroidism Continue Synthroid  Chronic pain syndrome Continue PTA oxycodone     Estimated body mass index is 38.91 kg/m as calculated from the following:   Height as of 09/21/20: '5\' 2"'$  (1.575 m).   Weight as of this encounter: 96.5 kg.    Procedures: Fluoroscopy-guided exchange by IR  Consultations:  Nephrology   IR   Discharge Exam: BP 121/82 (BP Location: Left Wrist)   Pulse 75   Temp 98.4 F (36.9 C) (Oral)   Resp 18   Wt 96.5 kg   SpO2 100%   BMI 38.91 kg/m    General: NAD Cardiovascular: S1, S2 present Respiratory: CTAB   Discharge Instructions You were cared for by a hospitalist during your hospital  stay. If you have any questions about your discharge medications or the care you received while you were in the hospital after you are discharged, you can call the unit and asked to speak with the hospitalist on call if the hospitalist that took care of you is not  available. Once you are discharged, your primary care physician will handle any further medical issues. Please note that NO REFILLS for any discharge medications will be authorized once you are discharged, as it is imperative that you return to your primary care physician (or establish a relationship with a primary care physician if you do not have one) for your aftercare needs so that they can reassess your need for medications and monitor your lab values.  Discharge Instructions    Diet - low sodium heart healthy   Complete by: As directed    Discharge wound care:   Complete by: As directed    Tuck piece of [place] Kellie Simmering # [phone]) into buttock wound in [place] Q day, then cover with foam dressing.  (Change foam dressing Q 3 days or PRN soiling.)   Increase activity slowly   Complete by: As directed      Allergies as of 10/13/2020      Reactions   Hydrocodone Itching, Nausea And Vomiting      Medication List    STOP taking these medications   nutrition supplement (JUVEN) Pack     TAKE these medications   acetaminophen 325 MG tablet Commonly known as: TYLENOL Take 1-2 tablets (325-650 mg total) by mouth every 4 (four) hours as needed for mild pain.   albuterol 108 (90 Base) MCG/ACT inhaler Commonly known as: VENTOLIN HFA Inhale 1-2 puffs into the lungs every 6 (six) hours as needed for wheezing or shortness of breath.   aspirin EC 81 MG tablet Take 81 mg by mouth daily. Swallow whole.   Auryxia 1 GM 210 MG(Fe) tablet Generic drug: ferric citrate Take 630 mg by mouth 3 (three) times daily with meals.   B-complex with vitamin C tablet Take 1 tablet by mouth daily.   brimonidine 0.2 % ophthalmic solution Commonly known as: ALPHAGAN Place 1 drop into both eyes 3 (three) times daily. What changed: when to take this   brinzolamide 1 % ophthalmic suspension Commonly known as: AZOPT Place 1 drop into both eyes 3 (three) times daily.   clonazePAM 0.5 MG  tablet Commonly known as: KLONOPIN Take 1/2 tablet (0.'25mg'$ ) po daily What changed:   how much to take  how to take this  when to take this  additional instructions   gabapentin 100 MG capsule Commonly known as: NEURONTIN Take 100 mg by mouth 2 (two) times daily.   insulin glargine 100 unit/mL Sopn Commonly known as: LANTUS Inject 4 Units into the skin at bedtime.   latanoprost 0.005 % ophthalmic solution Commonly known as: XALATAN Place 1 drop into both eyes in the morning.   levothyroxine 125 MCG tablet Commonly known as: SYNTHROID Take 1 tablet (125 mcg total) by mouth daily before breakfast.   lip balm ointment Apply topically as needed for lip care.   midodrine 10 MG tablet Commonly known as: PROAMATINE Take 1 tablet (10 mg total) by mouth 3 (three) times daily with meals. What changed:   when to take this  reasons to take this   montelukast 10 MG tablet Commonly known as: SINGULAIR Take 10 mg by mouth at bedtime.   Oxycodone HCl 10 MG Tabs Take 10 mg by  mouth every 6 (six) hours as needed (pain).   pantoprazole 40 MG tablet Commonly known as: PROTONIX Take 1 tablet (40 mg total) by mouth daily.   polyethylene glycol 17 g packet Commonly known as: MIRALAX / GLYCOLAX Take 17 g by mouth daily as needed for mild constipation.   QUEtiapine 50 MG tablet Commonly known as: SEROQUEL Take 1 tablet (50 mg total) by mouth at bedtime.   Rhopressa 0.02 % Soln Generic drug: Netarsudil Dimesylate Place 1 drop into both eyes at bedtime.   Trulicity [id number] Sopn Generic drug: Dulaglutide Inject 0.75 mg into the skin every Monday.            Discharge Care Instructions  (From admission, onward)         Start     Ordered   10/13/20 0000  Discharge wound care:       Comments: Tuck piece of [place] Kellie Simmering # [phone]) into buttock wound in [place] Q day, then cover with foam dressing.  (Change foam dressing Q 3 days or PRN soiling.)   10/13/20  1402         Allergies  Allergen Reactions  . Hydrocodone Itching and Nausea And Vomiting      The results of significant diagnostics from this hospitalization (including imaging, microbiology, ancillary and laboratory) are listed below for reference.    Significant Diagnostic Studies: DG Chest 2 View  Result Date: 10/11/2020 CLINICAL DATA:  Nonfunctioning dialysis catheter with need for dialysis. EXAM: CHEST - 2 VIEW COMPARISON:  Chest x-ray from 05/17/2020, follow-up films from prior catheter revision FINDINGS: Cardiac shadow is stable left dialysis catheter has withdrawn in the interval from the prior exam with the tip at the level of the mid superior vena cava. A previously was noted in the superior aspect of the right atrium. Mild vascular congestion is noted consistent with volume overload. No effusions are seen. No infiltrate is noted. Chronic scarring is noted bilaterally. IMPRESSION: Mild volume overload consistent with missed dialysis therapy. Dialysis catheter has withdrawn as described above. Electronically Signed   By: Inez Catalina M.D.   On: 10/11/2020 15:53   IR Fluoro Guide CV Line Left  Result Date: 10/12/2020 INDICATION: History of end-stage renal disease with chronic left jugular approach dialysis catheter post fluoroscopic guided exchange on 10/10/2020 Unfortunately, despite appropriate position of the dialysis catheter following exchange, the dialysis catheter has retracted to the level of the azygos vein and as such patient presents today for fluoroscopic guided exchange and lengthening of the present 23 cm tip to cuff dialysis catheter to a 28 cm dialysis catheter. EXAM: TUNNELED CENTRAL VENOUS HEMODIALYSIS CATHETER REPLACEMENT WITH FLUOROSCOPIC GUIDANCE MEDICATIONS: None FLUOROSCOPY TIME:  1 minute, 24 seconds (29 mGy) COMPLICATIONS: None immediate. PROCEDURE: Informed written consent was obtained from the patient after a discussion of the risks, benefits, and  alternatives to treatment. Questions regarding the procedure were encouraged and answered. The skin and external portion of the existing hemodialysis catheter was prepped with chlorhexidine in a sterile fashion, and a sterile drape was applied covering the operative field. Maximum barrier sterile technique with sterile gowns and gloves were used for the procedure. A timeout was performed prior to the initiation of the procedure. Preprocedural spot fluoroscopic image was obtained demonstrating retraction of the recently exchanged dialysis catheter to the level of the azygos vein. Both lumens of the hemodialysis catheter were cannulated with a stiff Glidewire and advanced to the level of the main pulmonary artery. Under intermittent fluoroscopic guidance,  the existing dialysis catheter was exchanged for a new, slightly larger now 28 cm tip to cuff palindrome dialysis catheter with tips ultimately terminating within the superior aspect of the right atrium. Final catheter positioning was confirmed and documented with a spot radiographic image. The catheter aspirates and flushes normally. The catheter was flushed with appropriate volume heparin dwells. The catheter exit site was secured with a 0-Prolene retention sutures. Both lumens were heparinized. A dressing was placed. The patient tolerated the procedure well without immediate post procedural complication. IMPRESSION: Successful replacement of 28 cm tip to cuff tunneled hemodialysis catheter with tips terminating within the right atrium. The catheter is ready for immediate use. Electronically Signed   By: Sandi Mariscal M.D.   On: 10/12/2020 14:41   IR Fluoro Guide CV Line Left  Result Date: 10/10/2020 INDICATION: 50 year old female referred for nonfunctional left IJ hemodialysis catheter EXAM: IMAGE GUIDED EXCHANGE OF NONFUNCTIONAL LEFT TUNNELED IJ HEMODIALYSIS CATHETER MEDICATIONS: None ANESTHESIA/SEDATION: None FLUOROSCOPY TIME:  Fluoroscopy Time: 0 minutes 42  seconds (6 mGy). COMPLICATIONS: None PROCEDURE: Informed written consent was obtained from the patient after a discussion of the risks, benefits, and alternatives to treatment. Questions regarding the procedure were encouraged and answered. The left neck and chest, including the indwelling catheter, were prepped with chlorhexidine in a sterile fashion, and a sterile drape was applied covering the operative field. Maximum barrier sterile technique with sterile gowns and gloves were used for the procedure. A timeout was performed prior to the initiation of the procedure. The hub a both the red and blue port were aspirated, achieving blood return. 1% lidocaine was used for local anesthesia. Blunt dissection was used to free the catheter. A stiff Glidewire was then advanced through the red hub, observed to enter the IVC under fluoroscopy. Catheter was removed from the [place]. A new 28 cm tip to cuff catheter was placed on the Glidewire. After removal of the coaxial introducers, image was performed. This revealed that the catheter tip was projecting over the lower cavoatrial junction near the diaphragm. Glidewire was then advanced to the IVC. We then selected a 23 cm tip to cuff catheter. This catheter was advanced on the Glidewire. The wire was removed and the introducers were removed. Aspiration of both red and blue hubs was achieved. Sterile saline flush was then pushed through both hubs. Hep-Lock was then placed. Catheter was sutured in position and a final image was stored. Patient tolerated the procedure well and remained hemodynamically stable throughout. No complications were encountered and no significant blood loss. IMPRESSION: Status post routine exchange of malpositioned/nonfunctional left IJ tunneled IJ hemodialysis catheter, with a new 23 cm tip to cuff catheter placed. Signed, Dulcy Fanny. Dellia Nims, RPVI [facility] Electronically Signed   By:  Corrie Mckusick D.O.   On: 10/10/2020 16:10   VAS US DUPLEX DIALYSIS ACCESS (AVF,AVG)  Result Date: 09/21/2020 DIALYSIS ACCESS Patient Name:  AUBRIANNE STEPRO  Date of Exam:   09/21/2020 Medical Rec #: [id number]         Accession #:    [id number] Date of Birth: 09/19/1970          Patient Gender: F Patient Age:   49Y Exam [facility] Procedure:      VAS US DUPLEX DIALYSIS ACCESS (AVF, AVG) Referring Phys: [id number] [place] --------------------------------------------------------------------------------  Reason for Exam: Routine follow up. Access Site: Right Upper Extremity. Access Type: Brachial-cephalic AVF. History: Created on. Performing Technologist:  Ralene Cork RVT  Examination Guidelines: A complete evaluation includes B-mode imaging, spectral Doppler, color Doppler, and power Doppler as needed of all accessible portions of each vessel. Unilateral testing is considered an integral part of a complete examination. Limited examinations for reoccurring indications may be performed as noted.  Findings: +--------------------+----------+-----------------+--------+ AVF                 PSV (cm/s)Flow Vol (mL/min)Comments +--------------------+----------+-----------------+--------+ Native artery inflow   124           546                +--------------------+----------+-----------------+--------+ AVF Anastomosis        285                              +--------------------+----------+-----------------+--------+  +------------+----------+-------------+----------+--------+ OUTFLOW VEINPSV (cm/s)Diameter (cm)Depth (cm)Describe +------------+----------+-------------+----------+--------+ Prox UA      252/503   0.614/0.52  1.38/1.41          +------------+----------+-------------+----------+--------+ Mid UA          65        0.60        1.10            +------------+----------+-------------+----------+--------+ Dist UA         65        0.75         0.69            +------------+----------+-------------+----------+--------+ AC Fossa       100        0.94        0.41            +------------+----------+-------------+----------+--------+   Summary: Patent right brachiocephalic AVF. No significant stenosis. Low flow volume. Slightly tortuous outflow vein. Velocity increase in the prox/mid upper arm with no thrombus seen.  *See table(s) above for measurements and observations.  Diagnosing physician: Jamelle Haring Electronically signed by Jamelle Haring on 09/21/2020 at 5:32:43 PM.   --------------------------------------------------------------------------------   Final     Microbiology: Recent Results (from the past 240 hour(s))  Resp Panel by RT-PCR (Flu A&B, Covid) Nasopharyngeal Swab     Status: None   Collection Time: 10/11/20  3:17 PM   Specimen: Nasopharyngeal Swab; Nasopharyngeal(NP) swabs in vial transport medium  Result Value Ref Range Status   SARS Coronavirus 2 by RT PCR NEGATIVE NEGATIVE Final    Comment: (NOTE) SARS-CoV-2 target nucleic acids are NOT DETECTED.  The SARS-CoV-2 RNA is generally detectable in upper respiratory specimens during the acute phase of infection. The lowest concentration of SARS-CoV-2 viral copies this assay can detect is 138 copies/mL. A negative result does not preclude SARS-Cov-2 infection and should not be used as the sole basis for treatment or other patient management decisions. A negative result may occur with  improper specimen collection/handling, submission of specimen other than nasopharyngeal swab, presence of viral mutation(s) within the areas targeted by this assay, and inadequate number of viral copies(<138 copies/mL). A negative result must be combined with clinical observations, patient history, and epidemiological information. The expected result is Negative.  Fact Sheet for Patients:  EntrepreneurPulse.com.au  Fact Sheet for Healthcare Providers:   IncredibleEmployment.be  This test is no t yet approved or cleared by the [place] FDA and  has been authorized for detection and/or diagnosis of SARS-CoV-2 by FDA under an Emergency Use Authorization (EUA). This EUA will remain  in effect (meaning this test can be used)  for the duration of the COVID-19 declaration under Section 564(b)(1) of the Act, 21 U.S.C.section 360bbb-3(b)(1), unless the authorization is terminated  or revoked sooner.       Influenza A by PCR NEGATIVE NEGATIVE Final   Influenza B by PCR NEGATIVE NEGATIVE Final    Comment: (NOTE) The Xpert Xpress SARS-CoV-2/FLU/RSV plus assay is intended as an aid in the diagnosis of influenza from Nasopharyngeal swab specimens and should not be used as a sole basis for treatment. Nasal washings and aspirates are unacceptable for Xpert Xpress SARS-CoV-2/FLU/RSV testing.  Fact Sheet for Patients: EntrepreneurPulse.com.au  Fact Sheet for Healthcare Providers: IncredibleEmployment.be  This test is not yet approved or cleared by the [place] FDA and has been authorized for detection and/or diagnosis of SARS-CoV-2 by FDA under an Emergency Use Authorization (EUA). This EUA will remain in effect (meaning this test can be used) for the duration of the COVID-19 declaration under Section 564(b)(1) of the Act, 21 U.S.C. section 360bbb-3(b)(1), unless the authorization is terminated or revoked.  Performed at [facility], [place] [address] [place],  [address]      Labs: Basic Metabolic Panel: Recent Labs  Lab 10/11/20 1516 10/12/20 0225 10/13/20 0357  NA 135 138 136  K 4.8 5.1 5.9*  CL 98 99 100  CO2 21* 21* 19*  GLUCOSE 283* 137* 132*  BUN 68* 73* 89*  CREATININE 13.81* 14.68* 16.90*  CALCIUM 9.1 9.2 8.2*  PHOS  --   --  >30.0*   Liver Function Tests: Recent Labs  Lab 10/13/20 0357  ALBUMIN 2.8*   No results for input(s): LIPASE,  AMYLASE in the last 168 hours. No results for input(s): AMMONIA in the last 168 hours. CBC: Recent Labs  Lab 10/11/20 1516 10/12/20 0225 10/13/20 0357  WBC 9.9 12.0* 10.4  NEUTROABS 6.7  --  6.1  HGB 10.4* 10.2* 9.5*  HCT 31.0* 30.5* 28.4*  MCV 86.8 88.2 86.6  PLT 311 289 258   Cardiac Enzymes: No results for input(s): CKTOTAL, CKMB, CKMBINDEX, TROPONINI in the last 168 hours. BNP: BNP (last 3 results) No results for input(s): BNP in the last 8760 hours.  ProBNP (last 3 results) No results for input(s): PROBNP in the last 8760 hours.  CBG: Recent Labs  Lab 10/12/20 1320 10/12/20 1647 10/12/20 2001 10/13/20 0631 10/13/20 1228  GLUCAP 200* 137* 156* 120* 151*       Signed:  Alma Friendly, MD [facility] 10/13/2020, 2:03 PM

## 2020-10-14 NOTE — Progress Notes (Signed)
NEW ADMISSION NOTE New Admission Note:   [place] to be discharged Home per MD order. Discussed prescriptions and follow up appointments with the patient. Prescriptions given to patient; medication list explained in detail. Patient verbalized understanding.  Skin clean, dry and intact without evidence of skin break down, no evidence of skin tears noted. IV catheter discontinued intact. Site without signs and symptoms of complications. Dressing and pressure applied. Pt denies pain at the site currently. No complaints noted.  Patient free of lines, drains, and wounds.   An After Visit Summary (AVS) was printed and given to the patient. Patient escorted via wheelchair, and discharged home via private auto.  Berneta Levins, RN Berneta Levins, RN

## 2020-10-15 ENCOUNTER — Other Ambulatory Visit: Payer: Self-pay | Admitting: Physical Medicine and Rehabilitation

## 2020-10-15 ENCOUNTER — Telehealth (HOSPITAL_COMMUNITY): Payer: Self-pay | Admitting: Nephrology

## 2020-10-15 NOTE — Telephone Encounter (Signed)
Transition of care contact from inpatient facility  Date of discharge: 10/13/2020 Date of contact: 10/15/2020 Method: Phone Spoke to: Patient  Patient contacted to discuss transition of care from recent inpatient hospitalization. Patient was admitted to [facility] from 5/13-5/14/2022 with discharge diagnosis of dialysis catheter dysfunction.  Medication changes were reviewed, no changes.  She denies dyspnea or pain at catheter site.  Patient will follow up with his/her outpatient HD unit on: tomorrow.  Veneta Penton, PA-C [employer] Pager [phone]

## 2020-11-02 ENCOUNTER — Other Ambulatory Visit: Payer: Self-pay

## 2020-11-02 ENCOUNTER — Encounter: Payer: Self-pay | Admitting: *Deleted

## 2020-11-02 ENCOUNTER — Other Ambulatory Visit: Payer: Self-pay | Admitting: *Deleted

## 2020-11-02 ENCOUNTER — Ambulatory Visit (HOSPITAL_COMMUNITY)
Admission: RE | Admit: 2020-11-02 | Discharge: 2020-11-02 | Disposition: A | Discharge status: Home / Self Care | Payer: Medicaid Other | Source: Ambulatory Visit | Attending: Vascular Surgery | Admitting: Vascular Surgery

## 2020-11-02 ENCOUNTER — Ambulatory Visit (INDEPENDENT_AMBULATORY_CARE_PROVIDER_SITE_OTHER): Payer: Medicaid Other | Admitting: Physician Assistant

## 2020-11-02 VITALS — BP 141/91 | HR 84 | Temp 97.6°F | Resp 20 | Ht 62.0 in | Wt 210.9 lb

## 2020-11-02 DIAGNOSIS — N186 End stage renal disease: Secondary | ICD-10-CM

## 2020-11-02 DIAGNOSIS — Z992 Dependence on renal dialysis: Secondary | ICD-10-CM | POA: Diagnosis not present

## 2020-11-02 NOTE — H&P (View-Only) (Signed)
POST OPERATIVE OFFICE NOTE    CC:  F/u for surgery  HPI:  This is a 50 y.o. female who is s/p right BC fistula creation  on 09/21/20 by Dr. Glynn Octave.    Pt returns today for follow up.  Pt states she has mild numbness in the fifth digit, no pain, loss of motor or loss of sensation in the right UE.  She is on HD TTS via [facility].  Allergies  Allergen Reactions  . Hydrocodone Itching and Nausea And Vomiting    Current Outpatient Medications  Medication Sig Dispense Refill  . acetaminophen (TYLENOL) 325 MG tablet Take 1-2 tablets (325-650 mg total) by mouth every 4 (four) hours as needed for mild pain.    Marland Kitchen albuterol (VENTOLIN HFA) 108 (90 Base) MCG/ACT inhaler Inhale 1-2 puffs into the lungs every 6 (six) hours as needed for wheezing or shortness of breath.    Marland Kitchen aspirin EC 81 MG tablet Take 81 mg by mouth daily. Swallow whole.    Lorin Picket 1 GM 210 MG(Fe) tablet Take 630 mg by mouth 3 (three) times daily with meals.    . B Complex-C (B-COMPLEX WITH VITAMIN C) tablet Take 1 tablet by mouth daily. 30 tablet 0  . brimonidine (ALPHAGAN) 0.2 % ophthalmic solution Place 1 drop into both eyes 3 (three) times daily. (Patient taking differently: Place 1 drop into both eyes in the morning and at bedtime.) 5 mL 12  . brinzolamide (AZOPT) 1 % ophthalmic suspension Place 1 drop into both eyes 3 (three) times daily. 10 mL 12  . clonazePAM (KLONOPIN) 0.5 MG tablet Take 1/2 tablet (0.'25mg'$ ) po daily (Patient taking differently: Take 0.25 mg by mouth daily.) 15 tablet 0  . DIPHENHYDRAMINE HCL PO Diphenhydramine    . Dulaglutide (TRULICITY) A999333 0000000 SOPN Inject 0.75 mg into the skin every Monday.    . gabapentin (NEURONTIN) 100 MG capsule Take 100 mg by mouth 2 (two) times daily.    . insulin glargine (LANTUS) 100 unit/mL SOPN Inject 4 Units into the skin at bedtime. 15 mL 0  . latanoprost (XALATAN) 0.005 % ophthalmic solution Place 1 drop into both eyes in the morning. 2.5 mL 12  . levothyroxine (SYNTHROID)  125 MCG tablet Take 1 tablet (125 mcg total) by mouth daily before breakfast. 30 tablet 0  . lidocaine-prilocaine (EMLA) cream SMARTSIG:Sparingly Topical 3 Times a Week    . lip balm (CARMEX) ointment Apply topically as needed for lip care. 7 g 0  . midodrine (PROAMATINE) 10 MG tablet Take 1 tablet (10 mg total) by mouth 3 (three) times daily with meals. (Patient taking differently: Take 10 mg by mouth 3 (three) times daily as needed (low blood pressure).) 90 tablet 0  . montelukast (SINGULAIR) 10 MG tablet Take 10 mg by mouth at bedtime.    Mckinley Jewel Dimesylate (RHOPRESSA) 0.02 % SOLN Place 1 drop into both eyes at bedtime.    . Oxycodone HCl 10 MG TABS Take 10 mg by mouth every 6 (six) hours as needed (pain).    . pantoprazole (PROTONIX) 40 MG tablet Take 1 tablet (40 mg total) by mouth daily. 30 tablet 0  . polyethylene glycol (MIRALAX / GLYCOLAX) 17 g packet Take 17 g by mouth daily as needed for mild constipation. 14 each 0  . QUEtiapine (SEROQUEL) 50 MG tablet Take 1 tablet (50 mg total) by mouth at bedtime. 30 tablet 0   No current facility-administered medications for this visit.     ROS:  See HPI  Physical Exam:  Findings:  +--------------------+----------+-----------------+---------+  AVF         PSV (cm/s)Flow Vol (mL/min)Comments   +--------------------+----------+-----------------+---------+  Native artery inflow  195      831           +--------------------+----------+-----------------+---------+  AVF Anastomosis     628           3.2 ratio  +--------------------+----------+-----------------+---------+     +------------+----------+-------------+----------+--------+  OUTFLOW VEINPSV (cm/s)Diameter (cm)Depth (cm)Describe  +------------+----------+-------------+----------+--------+  Prox UA     111    0.57     1.30        +------------+----------+-------------+----------+--------+  Mid UA      65    0.60     1.05        +------------+----------+-------------+----------+--------+  Dist UA     63    0.66    62.00        +------------+----------+-------------+----------+--------+  AC Fossa    80    1.00     0.26        +------------+----------+-------------+----------+--------+   Incision:  Well healed right UE Extremities:  Grip 5/5, sensation intact, palpable thrill and radial pulse right UE. Lungs: non labored breathing    Assessment/Plan:  This is a 50 y.o. female who is s/p:Right BC AV fistula creation. The diameter is maturing well, but the fistula is to deep to access.  I will schedule her for superficialization  Of the fistula to improve access.       Roxy Horseman PA-C [facility] [phone]   Clinic MD:  Donzetta Matters

## 2020-11-02 NOTE — Progress Notes (Signed)
POST OPERATIVE OFFICE NOTE    CC:  F/u for surgery  HPI:  This is a 50 y.o. female who is s/p right BC fistula creation  on 09/21/20 by Dr. Glynn Octave.    Pt returns today for follow up.  Pt states she has mild numbness in the fifth digit, no pain, loss of motor or loss of sensation in the right UE.  She is on HD TTS via [facility].  Allergies  Allergen Reactions  . Hydrocodone Itching and Nausea And Vomiting    Current Outpatient Medications  Medication Sig Dispense Refill  . acetaminophen (TYLENOL) 325 MG tablet Take 1-2 tablets (325-650 mg total) by mouth every 4 (four) hours as needed for mild pain.    Marland Kitchen albuterol (VENTOLIN HFA) 108 (90 Base) MCG/ACT inhaler Inhale 1-2 puffs into the lungs every 6 (six) hours as needed for wheezing or shortness of breath.    Marland Kitchen aspirin EC 81 MG tablet Take 81 mg by mouth daily. Swallow whole.    Lorin Picket 1 GM 210 MG(Fe) tablet Take 630 mg by mouth 3 (three) times daily with meals.    . B Complex-C (B-COMPLEX WITH VITAMIN C) tablet Take 1 tablet by mouth daily. 30 tablet 0  . brimonidine (ALPHAGAN) 0.2 % ophthalmic solution Place 1 drop into both eyes 3 (three) times daily. (Patient taking differently: Place 1 drop into both eyes in the morning and at bedtime.) 5 mL 12  . brinzolamide (AZOPT) 1 % ophthalmic suspension Place 1 drop into both eyes 3 (three) times daily. 10 mL 12  . clonazePAM (KLONOPIN) 0.5 MG tablet Take 1/2 tablet (0.'25mg'$ ) po daily (Patient taking differently: Take 0.25 mg by mouth daily.) 15 tablet 0  . DIPHENHYDRAMINE HCL PO Diphenhydramine    . Dulaglutide (TRULICITY) A999333 0000000 SOPN Inject 0.75 mg into the skin every Monday.    . gabapentin (NEURONTIN) 100 MG capsule Take 100 mg by mouth 2 (two) times daily.    . insulin glargine (LANTUS) 100 unit/mL SOPN Inject 4 Units into the skin at bedtime. 15 mL 0  . latanoprost (XALATAN) 0.005 % ophthalmic solution Place 1 drop into both eyes in the morning. 2.5 mL 12  . levothyroxine (SYNTHROID)  125 MCG tablet Take 1 tablet (125 mcg total) by mouth daily before breakfast. 30 tablet 0  . lidocaine-prilocaine (EMLA) cream SMARTSIG:Sparingly Topical 3 Times a Week    . lip balm (CARMEX) ointment Apply topically as needed for lip care. 7 g 0  . midodrine (PROAMATINE) 10 MG tablet Take 1 tablet (10 mg total) by mouth 3 (three) times daily with meals. (Patient taking differently: Take 10 mg by mouth 3 (three) times daily as needed (low blood pressure).) 90 tablet 0  . montelukast (SINGULAIR) 10 MG tablet Take 10 mg by mouth at bedtime.    Mckinley Jewel Dimesylate (RHOPRESSA) 0.02 % SOLN Place 1 drop into both eyes at bedtime.    . Oxycodone HCl 10 MG TABS Take 10 mg by mouth every 6 (six) hours as needed (pain).    . pantoprazole (PROTONIX) 40 MG tablet Take 1 tablet (40 mg total) by mouth daily. 30 tablet 0  . polyethylene glycol (MIRALAX / GLYCOLAX) 17 g packet Take 17 g by mouth daily as needed for mild constipation. 14 each 0  . QUEtiapine (SEROQUEL) 50 MG tablet Take 1 tablet (50 mg total) by mouth at bedtime. 30 tablet 0   No current facility-administered medications for this visit.     ROS:  See HPI  Physical Exam:  Findings:  +--------------------+----------+-----------------+---------+  AVF         PSV (cm/s)Flow Vol (mL/min)Comments   +--------------------+----------+-----------------+---------+  Native artery inflow  195      831           +--------------------+----------+-----------------+---------+  AVF Anastomosis     628           3.2 ratio  +--------------------+----------+-----------------+---------+     +------------+----------+-------------+----------+--------+  OUTFLOW VEINPSV (cm/s)Diameter (cm)Depth (cm)Describe  +------------+----------+-------------+----------+--------+  Prox UA     111    0.57     1.30        +------------+----------+-------------+----------+--------+  Mid UA      65    0.60     1.05        +------------+----------+-------------+----------+--------+  Dist UA     63    0.66    62.00        +------------+----------+-------------+----------+--------+  AC Fossa    80    1.00     0.26        +------------+----------+-------------+----------+--------+   Incision:  Well healed right UE Extremities:  Grip 5/5, sensation intact, palpable thrill and radial pulse right UE. Lungs: non labored breathing    Assessment/Plan:  This is a 51 y.o. female who is s/p:Right BC AV fistula creation. The diameter is maturing well, but the fistula is to deep to access.  I will schedule her for superficialization  Of the fistula to improve access.       Roxy Horseman PA-C [facility] [phone]   Clinic MD:  Donzetta Matters

## 2020-11-13 ENCOUNTER — Encounter (HOSPITAL_COMMUNITY): Payer: Self-pay | Admitting: Vascular Surgery

## 2020-11-13 ENCOUNTER — Other Ambulatory Visit: Payer: Self-pay

## 2020-11-13 NOTE — Progress Notes (Signed)
PCP - Dr. Darrol Jump Cardiologist -  EKG - 03/02/20 Chest x-ray - 10/11/20 ECHO - 02/29/20 Cardiac Cath - 03/17/16 CPAP - BiPAP - does not know settings   Fasting Blood Sugar:  151-167 Checks Blood Sugar:  1x/day  Blood Thinner Instructions:  Aspirin Instructions:   ERAS Protcol -   COVID TEST- n/a  Anesthesia review: n/a  -------------  SDW INSTRUCTIONS:  Your procedure is scheduled on 6/15 Wednesday . Please report to [facility] Main Entrance "A" at 1045 A.M., and check in at the Admitting office. Call this number if you have problems the morning of surgery: [phone]   Remember: Do not eat or drink after midnight the night before your surgery   Medications to take morning of surgery with a sip of water include: acetaminophen (TYLENOL) if needed Oxycodone if needed albuterol (VENTOLIN HFA) and fluticasone furoate-vilanterol (BREO ELLIPTA)  --- Please bring all inhalers with you the day of surgery.   aspirin  gabapentin (NEURONTIN)  levothyroxine (SYNTHROID) montelukast (SINGULAIR) pantoprazole (PROTONIX)  As of today, STOP taking any Aleve, Naproxen, Ibuprofen, Motrin, Advil, Goody's, BC's, all herbal medications, fish oil, and all vitamins.  ** PLEASE check your blood sugar the morning of your surgery when you wake up and every 2 hours until you get to the Short Stay unit.  If your blood sugar is less than 70 mg/dL, you will need to treat for low blood sugar: Do not take insulin. Treat a low blood sugar (less than 70 mg/dL) with  cup of clear juice (cranberry or apple), 4 glucose tablets, OR glucose gel. Recheck blood sugar in 15 minutes after treatment (to make sure it is greater than 70 mg/dL). If your blood sugar is not greater than 70 mg/dL on recheck, call [phone] for further instructions.  Dulaglutide (TRULICITY)  0000000 take as usual  insulin glargine (LANTUS) 5/14 - 2 units   The Morning of Surgery Do not wear jewelry, make-up or nail  polish. Do not wear lotions, powders, or perfumes, or deodorant Do not shave 48 hours prior to surgery.   Do not bring valuables to the hospital. [facility] is not responsible for any belongings or valuables.  If you are a smoker, DO NOT Smoke 24 hours prior to surgery If you wear a CPAP at night please bring your mask the morning of surgery  Remember that you must have someone to transport you home after your surgery, and remain with you for 24 hours if you are discharged the same day.  Please bring cases for contacts, glasses, hearing aids, dentures or bridgework because it cannot be worn into surgery.   Patients discharged the day of surgery will not be allowed to drive home.   Please shower the NIGHT BEFORE/MORNING OF SURGERY (use antibacterial soap like DIAL soap if possible). Wear comfortable clothes the morning of surgery. Oral Hygiene is also important to reduce your risk of infection.  Remember - BRUSH YOUR TEETH THE MORNING OF SURGERY WITH YOUR REGULAR TOOTHPASTE  Patient denies shortness of breath, fever, cough and chest pain.

## 2020-11-14 ENCOUNTER — Encounter (HOSPITAL_COMMUNITY)
Admission: RE | Disposition: A | Discharge status: Home / Self Care | Payer: Self-pay | Source: Home / Self Care | Attending: Vascular Surgery

## 2020-11-14 ENCOUNTER — Ambulatory Visit (HOSPITAL_COMMUNITY): Payer: Medicaid Other | Admitting: Anesthesiology

## 2020-11-14 ENCOUNTER — Encounter (HOSPITAL_COMMUNITY): Payer: Self-pay | Admitting: Vascular Surgery

## 2020-11-14 ENCOUNTER — Telehealth: Payer: Self-pay

## 2020-11-14 ENCOUNTER — Ambulatory Visit (HOSPITAL_COMMUNITY)
Admission: RE | Admit: 2020-11-14 | Discharge: 2020-11-14 | Disposition: A | Discharge status: Home / Self Care | Payer: Medicaid Other | Attending: Vascular Surgery | Admitting: Vascular Surgery

## 2020-11-14 DIAGNOSIS — Z7984 Long term (current) use of oral hypoglycemic drugs: Secondary | ICD-10-CM | POA: Insufficient documentation

## 2020-11-14 DIAGNOSIS — N186 End stage renal disease: Secondary | ICD-10-CM | POA: Insufficient documentation

## 2020-11-14 DIAGNOSIS — Z7982 Long term (current) use of aspirin: Secondary | ICD-10-CM | POA: Insufficient documentation

## 2020-11-14 DIAGNOSIS — Z992 Dependence on renal dialysis: Secondary | ICD-10-CM | POA: Insufficient documentation

## 2020-11-14 DIAGNOSIS — Z79899 Other long term (current) drug therapy: Secondary | ICD-10-CM | POA: Insufficient documentation

## 2020-11-14 DIAGNOSIS — Z79890 Hormone replacement therapy (postmenopausal): Secondary | ICD-10-CM | POA: Diagnosis not present

## 2020-11-14 DIAGNOSIS — Z794 Long term (current) use of insulin: Secondary | ICD-10-CM | POA: Insufficient documentation

## 2020-11-14 DIAGNOSIS — Z885 Allergy status to narcotic agent status: Secondary | ICD-10-CM | POA: Insufficient documentation

## 2020-11-14 HISTORY — PX: LIGATION OF COMPETING BRANCHES OF ARTERIOVENOUS FISTULA: SHX5949

## 2020-11-14 LAB — GLUCOSE, CAPILLARY
Glucose-Capillary: 251 mg/dL — ABNORMAL HIGH (ref 70–99)
Glucose-Capillary: 157 mg/dL — ABNORMAL HIGH (ref 70–99)

## 2020-11-14 SURGERY — LIGATION OF COMPETING BRANCHES OF ARTERIOVENOUS FISTULA
Anesthesia: Regional | Site: Arm Upper | Laterality: Right

## 2020-11-14 MED ORDER — MIDAZOLAM HCL 2 MG/2ML IJ SOLN
INTRAMUSCULAR | Status: AC
  Filled 2020-11-14: qty 2

## 2020-11-14 MED ORDER — PROPOFOL 10 MG/ML IV BOLUS
INTRAVENOUS | Status: AC
  Filled 2020-11-14: qty 20

## 2020-11-14 MED ORDER — SODIUM CHLORIDE 0.9 % IV SOLN
INTRAVENOUS | Status: DC | PRN
  Administered 2020-11-14: 500 mL

## 2020-11-14 MED ORDER — CHLORHEXIDINE GLUCONATE 0.12 % MT SOLN
15.0000 mL | Freq: Once | OROMUCOSAL | Status: AC
  Administered 2020-11-14: 15 mL via OROMUCOSAL

## 2020-11-14 MED ORDER — MIDAZOLAM HCL 2 MG/2ML IJ SOLN: 1.0000 mg | Freq: Once | INTRAMUSCULAR | Status: AC

## 2020-11-14 MED ORDER — OXYCODONE-ACETAMINOPHEN 5-325 MG PO TABS: 1.0000 | ORAL_TABLET | Freq: Four times a day (QID) | ORAL | 0 refills | Status: AC | PRN

## 2020-11-14 MED ORDER — SODIUM CHLORIDE 0.9 % IV SOLN: INTRAVENOUS | Status: DC

## 2020-11-14 MED ORDER — PROPOFOL 500 MG/50ML IV EMUL
INTRAVENOUS | Status: DC | PRN
  Administered 2020-11-14: 100 ug/kg/min via INTRAVENOUS

## 2020-11-14 MED ORDER — MIDAZOLAM HCL 5 MG/5ML IJ SOLN
INTRAMUSCULAR | Status: DC | PRN
  Administered 2020-11-14 (×2): 1 mg via INTRAVENOUS

## 2020-11-14 MED ORDER — INSULIN ASPART 100 UNIT/ML IJ SOLN: 3.0000 [IU] | Freq: Once | INTRAMUSCULAR | Status: AC

## 2020-11-14 MED ORDER — 0.9 % SODIUM CHLORIDE (POUR BTL) OPTIME
TOPICAL | Status: DC | PRN
  Administered 2020-11-14: 1000 mL

## 2020-11-14 MED ORDER — CHLORHEXIDINE GLUCONATE 0.12 % MT SOLN
OROMUCOSAL | Status: AC
  Filled 2020-11-14: qty 15

## 2020-11-14 MED ORDER — LIDOCAINE HCL (CARDIAC) PF 100 MG/5ML IV SOSY
PREFILLED_SYRINGE | INTRAVENOUS | Status: DC | PRN
  Administered 2020-11-14: 25 mg via INTRATRACHEAL

## 2020-11-14 MED ORDER — FENTANYL CITRATE (PF) 100 MCG/2ML IJ SOLN: 50.0000 ug | Freq: Once | INTRAMUSCULAR | Status: AC

## 2020-11-14 MED ORDER — LIDOCAINE HCL (PF) 2 % IJ SOLN
INTRAMUSCULAR | Status: AC
  Filled 2020-11-14: qty 5

## 2020-11-14 MED ORDER — PROPOFOL 1000 MG/100ML IV EMUL
INTRAVENOUS | Status: AC
  Filled 2020-11-14: qty 100

## 2020-11-14 MED ORDER — ONDANSETRON HCL 4 MG/2ML IJ SOLN
INTRAMUSCULAR | Status: AC
  Filled 2020-11-14: qty 2

## 2020-11-14 MED ORDER — ONDANSETRON HCL 4 MG/2ML IJ SOLN
INTRAMUSCULAR | Status: DC | PRN
  Administered 2020-11-14: 4 mg via INTRAVENOUS

## 2020-11-14 MED ORDER — CHLORHEXIDINE GLUCONATE 4 % EX LIQD: 60.0000 mL | Freq: Once | CUTANEOUS | Status: DC

## 2020-11-14 MED ORDER — INSULIN ASPART 100 UNIT/ML IJ SOLN
INTRAMUSCULAR | Status: AC
  Administered 2020-11-14: 3 [IU] via SUBCUTANEOUS
  Filled 2020-11-14: qty 1

## 2020-11-14 MED ORDER — DEXAMETHASONE SODIUM PHOSPHATE 10 MG/ML IJ SOLN
INTRAMUSCULAR | Status: AC
  Filled 2020-11-14: qty 1

## 2020-11-14 MED ORDER — CEFAZOLIN SODIUM-DEXTROSE 2-4 GM/100ML-% IV SOLN
2.0000 g | INTRAVENOUS | Status: AC
  Administered 2020-11-14: 2 g via INTRAVENOUS

## 2020-11-14 MED ORDER — CEFAZOLIN SODIUM-DEXTROSE 2-4 GM/100ML-% IV SOLN
INTRAVENOUS | Status: AC
  Filled 2020-11-14: qty 100

## 2020-11-14 MED ORDER — FENTANYL CITRATE (PF) 100 MCG/2ML IJ SOLN
INTRAMUSCULAR | Status: AC
  Administered 2020-11-14: 50 ug via INTRAVENOUS
  Filled 2020-11-14: qty 2

## 2020-11-14 MED ORDER — ORAL CARE MOUTH RINSE: 15.0000 mL | Freq: Once | OROMUCOSAL | Status: AC

## 2020-11-14 MED ORDER — FENTANYL CITRATE (PF) 250 MCG/5ML IJ SOLN
INTRAMUSCULAR | Status: AC
  Filled 2020-11-14: qty 5

## 2020-11-14 MED ORDER — MIDAZOLAM HCL 2 MG/2ML IJ SOLN
INTRAMUSCULAR | Status: AC
  Administered 2020-11-14: 1 mg via INTRAVENOUS
  Filled 2020-11-14: qty 2

## 2020-11-14 SURGICAL SUPPLY — 28 items
CANISTER SUCT 3000ML PPV (MISCELLANEOUS) ×3 IMPLANT
CATH EMB 4FR 40CM (CATHETERS) ×3 IMPLANT
CLIP LIGATING EXTRA MED SLVR (CLIP) ×3 IMPLANT
CLIP LIGATING EXTRA SM BLUE (MISCELLANEOUS) ×3 IMPLANT
CLIP VESOCCLUDE MED 6/CT (CLIP) ×3 IMPLANT
CLIP VESOCCLUDE SM WIDE 6/CT (CLIP) ×3 IMPLANT
COVER PROBE W GEL 5X96 (DRAPES) IMPLANT
DERMABOND ADVANCED (GAUZE/BANDAGES/DRESSINGS) ×2
DERMABOND ADVANCED .7 DNX12 (GAUZE/BANDAGES/DRESSINGS) ×1 IMPLANT
ELECT REM PT RETURN 9FT ADLT (ELECTROSURGICAL) ×3
ELECTRODE REM PT RTRN 9FT ADLT (ELECTROSURGICAL) ×1 IMPLANT
GOWN STRL REUS W/ TWL LRG LVL3 (GOWN DISPOSABLE) ×2 IMPLANT
GOWN STRL REUS W/ TWL XL LVL3 (GOWN DISPOSABLE) ×1 IMPLANT
GOWN STRL REUS W/TWL LRG LVL3 (GOWN DISPOSABLE) ×4
GOWN STRL REUS W/TWL XL LVL3 (GOWN DISPOSABLE) ×2
KIT BASIN OR (CUSTOM PROCEDURE TRAY) ×3 IMPLANT
KIT TURNOVER KIT B (KITS) IMPLANT
NS IRRIG 1000ML POUR BTL (IV SOLUTION) ×3 IMPLANT
PACK CV ACCESS (CUSTOM PROCEDURE TRAY) ×3 IMPLANT
PAD ARMBOARD 7.5X6 YLW CONV (MISCELLANEOUS) ×6 IMPLANT
SUT MNCRL AB 4-0 PS2 18 (SUTURE) ×6 IMPLANT
SUT PROLENE 6 0 BV (SUTURE) ×9 IMPLANT
SUT SILK 0 TIES 10X30 (SUTURE) ×3 IMPLANT
SUT VIC AB 3-0 SH 27 (SUTURE) ×4
SUT VIC AB 3-0 SH 27X BRD (SUTURE) ×2 IMPLANT
TOWEL GREEN STERILE (TOWEL DISPOSABLE) ×3 IMPLANT
UNDERPAD 30X36 HEAVY ABSORB (UNDERPADS AND DIAPERS) ×3 IMPLANT
WATER STERILE IRR 1000ML POUR (IV SOLUTION) ×3 IMPLANT

## 2020-11-14 NOTE — Op Note (Signed)
    Patient name: Wanda Ramirez MRN: [id number] DOB: 1970/10/01 Sex: female  11/14/2020 Pre-operative Diagnosis: ESRD Post-operative diagnosis:  Same Surgeon:  [name] C. Donzetta Matters, MD Assistant: Arlee Muslim, PA Procedure Performed: Revision of right arm cephalic vein AV fistula with transposition  Indications: 50 year old female with end-stage renal disease currently dialyzing via catheter.  She has previously had a right brachial artery to cephalic vein fistula placed which is too deep for cannulation.  She is now indicated for revision with transposition possible branch ligation possible superficialization.  An assistant was necessary to expedite the case  Findings: Fistula towards the antecubitum was rather large approximately 5 mm but towards the upper arm was quite a bit more diminutive.  We did dilate this with a 4 Fogarty.  There were multiple branches these were divided the fistula was tunneled laterally to its existing site and the skin was closed over top.  At completion there was a good thrill in the fistula and a palpable radial artery pulse the wrist.   Procedure:  The patient was identified in the holding area and taken to the operating where she is placed supine operative table and MAC anesthesia induced.  Preoperative block of been placed.  She was sterilely prepped draped in the right upper extremity usual fashion, antibiotics were ministered a timeout was called.  The block was tested was noted to be intact.  Longitudinal incision was made over top of the fistula towards the antecubitum.  We dissected out the fistula throughout its course and the incision dividing branches between clips and ties.  A counterincision was made towards the upper arm.  The entirety of the fistula was dissected free through both incisions it was marked for orientation.  I clamped near the antecubitum and divided it.  I flushed with heparinized saline.  I tunneled it laterally just under the skin.  We then  spatulated both ends.  I did dilate the fistula with a 4 Fogarty with delayed drawbacks again flushed with heparinized saline and reclamped.  I then sewn end-to-end with 6-0 Prolene suture.  Prior completion of flushing all directions.  Upon completion there was increased flow through the fistula somewhat pulsatile.  We dissected free the soft tissue ahead to repair 1 area with interrupted 6-0 Prolene towards the upper arm.  We then had much improved flow through the fistula and a palpable radial artery pulse the wrist.  We irrigated with saline obtaining the stasis and closed some deep tissue underneath the fistula with interrupted 3-0 Vicryl suture.  The skin was then closed on top of the fistula in both incisions with 4-0 Monocryl.  Dermabond was placed to the level of the skin.  She was awakened from anesthesia having tolerated procedure without any complication.  All counts were correct at completion.  EBL: 100 cc   Adelyne Marchese C. Donzetta Matters, MD [facility] [place] Office: [phone] Pager: [phone]

## 2020-11-14 NOTE — Progress Notes (Signed)
Pt completed phase 1 care in PACU-- in NAD and VS stable. Discharge instructions went over with pt and pt's daughter/transportation. Pt's daughter is ~15 min away from the hospital. Pt is in phase 2 and does not require further VS monitoring. Will continue to monitor pt until ride is available.

## 2020-11-14 NOTE — Discharge Instructions (Signed)
° °  [facility] [place] ° °Discharge Instructions ° °AV Fistula or Graft Surgery for Dialysis Access ° °Please refer to the following instructions for your post-procedure care. Your surgeon or physician assistant will discuss any changes with you. ° °Activity ° °You may drive the day following your surgery, if you are comfortable and no longer taking prescription pain medication. Resume full activity as the soreness in your incision resolves. ° °Bathing/Showering ° °You may shower after you go home. Keep your incision dry for 48 hours. Do not soak in a bathtub, hot tub, or swim until the incision heals completely. You may not shower if you have a hemodialysis catheter. ° °Incision Care ° °Clean your incision with mild soap and water after 48 hours. Pat the area dry with a clean towel. You do not need a bandage unless otherwise instructed. Do not apply any ointments or creams to your incision. You may have skin glue on your incision. Do not peel it off. It will come off on its own in about one week. Your arm may swell a bit after surgery. To reduce swelling use pillows to elevate your arm so it is above your heart. Your doctor will tell you if you need to lightly wrap your arm with an ACE bandage. ° °Diet ° °Resume your normal diet. There are not special food restrictions following this procedure. In order to heal from your surgery, it is CRITICAL to get adequate nutrition. Your body requires vitamins, minerals, and protein. Vegetables are the best source of vitamins and minerals. Vegetables also provide the perfect balance of protein. Processed food has little nutritional value, so try to avoid this. ° °Medications ° °Resume taking all of your medications. If your incision is causing pain, you may take over-the counter pain relievers such as acetaminophen (Tylenol). If you were prescribed a stronger pain medication, please be aware these medications can cause nausea and constipation. Prevent  nausea by taking the medication with a snack or meal. Avoid constipation by drinking plenty of fluids and eating foods with high amount of fiber, such as fruits, vegetables, and grains. Do not take Tylenol if you are taking prescription pain medications. ° ° ° ° °Follow up °Your surgeon may want to see you in the office following your access surgery. If so, this will be arranged at the time of your surgery. ° °Please call us immediately for any of the following conditions: ° °Increased pain, redness, drainage (pus) from your incision site °Fever of 101 degrees or higher °Severe or worsening pain at your incision site °Hand pain or numbness. ° °Reduce your risk of vascular disease: ° °Stop smoking. If you would like help, call [facility] at 1-800-QUIT-NOW (1-800-784-8669) or [place] at 336-586-4000 ° °Manage your cholesterol °Maintain a desired weight °Control your diabetes °Keep your blood pressure down ° °Dialysis ° °It will take several weeks to several months for your new dialysis access to be ready for use. Your surgeon will determine when it is OK to use it. Your nephrologist will continue to direct your dialysis. You can continue to use your Permcath until your new access is ready for use. ° °If you have any questions, please call the office at [phone]. ° °

## 2020-11-14 NOTE — Transfer of Care (Signed)
Immediate Anesthesia Transfer of Care Note  Patient: Wanda Ramirez  Procedure(s) Performed: LIGATION OF COMPETING BRANCHES AND SUPERFICIALIZATION OF RIGHT ARTERIOVENOUS FISTULA (Right: Arm Upper)  Patient Location: PACU  Anesthesia Type:MAC  Level of Consciousness: drowsy and patient cooperative  Airway & Oxygen Therapy: Patient Spontanous Breathing  Post-op Assessment: Report given to RN and Post -op Vital signs reviewed and stable  Post vital signs: Reviewed and stable  Last Vitals:  Vitals Value Taken Time  BP 139/104 11/14/20 1502  Temp    Pulse    Resp 22 11/14/20 1503  SpO2    Vitals shown include unvalidated device data.  Last Pain:  Vitals:   11/14/20 1132  TempSrc:   PainSc: 0-No pain      Patients Stated Pain Goal: 2 (62/70/35 0093)  Complications: No notable events documented.

## 2020-11-14 NOTE — Telephone Encounter (Signed)
[facility] called to verify a fill of pain med rx. Advised patient had surgery today - okay to dispense.

## 2020-11-14 NOTE — Progress Notes (Signed)
Orthopedic Tech Progress Note Patient Details:  Wanda Ramirez 03-16-1971 ID:[id number]  PACU RN called requesting an ARM SLING for patient. Said she would apply once patient got dressed   Ortho Devices Type of Ortho Device: Arm sling Ortho Device/Splint Location: RUE Ortho Device/Splint Interventions: Other (comment)   Post Interventions Patient Tolerated: Well Instructions Provided: Care of device  Janit Pagan 11/14/2020, 4:44 PM

## 2020-11-14 NOTE — Anesthesia Procedure Notes (Signed)
Procedure Name: MAC Date/Time: 11/14/2020 1:45 PM Performed by: Michele Rockers, CRNA Pre-anesthesia Checklist: Patient identified, Emergency Drugs available, Suction available, Timeout performed and Patient being monitored Patient Re-evaluated:Patient Re-evaluated prior to induction Oxygen Delivery Method: Simple face mask

## 2020-11-14 NOTE — Anesthesia Preprocedure Evaluation (Signed)
Anesthesia Evaluation  Patient identified by MRN, date of birth, ID band Patient awake    Reviewed: Allergy & Precautions, NPO status , Patient's Chart, lab work & pertinent test results  Airway Mallampati: II   Neck ROM: Full    Dental  (+) Teeth Intact   Pulmonary former smoker,    breath sounds clear to auscultation       Cardiovascular hypertension,  Rhythm:Regular Rate:Normal     Neuro/Psych    GI/Hepatic   Endo/Other  diabetes  Renal/GU      Musculoskeletal   Abdominal   Peds  Hematology   Anesthesia Other Findings   Reproductive/Obstetrics                             Anesthesia Physical Anesthesia Plan  ASA: 3  Anesthesia Plan: MAC   Post-op Pain Management:  Regional for Post-op pain   Induction:   PONV Risk Score and Plan: Ondansetron and Propofol infusion  Airway Management Planned: Natural Airway and Simple Face Mask  Additional Equipment:   Intra-op Plan:   Post-operative Plan:   Informed Consent: I have reviewed the patients History and Physical, chart, labs and discussed the procedure including the risks, benefits and alternatives for the proposed anesthesia with the patient or authorized representative who has indicated his/her understanding and acceptance.       Plan Discussed with: CRNA and Anesthesiologist  Anesthesia Plan Comments:         Anesthesia Quick Evaluation

## 2020-11-14 NOTE — Anesthesia Procedure Notes (Signed)
Anesthesia Regional Block: Supraclavicular block   Pre-Anesthetic Checklist: , timeout performed,  Correct Patient, Correct Site, Correct Laterality,  Correct Procedure, Correct Position, site marked,  Risks and benefits discussed,  Surgical consent,  Pre-op evaluation,  At surgeon's request and post-op pain management  Laterality: Left  Prep: chloraprep       Needles:  Injection technique: Single-shot  Needle Type: Stimulator Needle - 80          Additional Needles:   Procedures:, nerve stimulator,,,,,    Narrative:  Start time: 11/14/2020 12:25 PM End time: 11/14/2020 12:35 PM Injection made incrementally with aspirations every 5 mL.  Performed by: Personally   Additional Notes: 20 cc 0.75% Ropivacaine injected easily

## 2020-11-14 NOTE — Interval H&P Note (Signed)
History and Physical Interval Note:  11/14/2020 10:54 AM  Wanda Ramirez  has presented today for surgery, with the diagnosis of ESRD.  The various methods of treatment have been discussed with the patient and family. After consideration of risks, benefits and other options for treatment, the patient has consented to  Procedure(s): LIGATION OF COMPETING BRANCHES AND SUPERFICIALIZATION OF RIGHT ARTERIOVENOUS FISTULA (Right) as a surgical intervention.  The patient's history has been reviewed, patient examined, no change in status, stable for surgery.  I have reviewed the patient's chart and labs.  Questions were answered to the patient's satisfaction.     Servando Snare

## 2020-11-14 NOTE — Anesthesia Postprocedure Evaluation (Signed)
Anesthesia Post Note  Patient: Wanda Ramirez  Procedure(s) Performed: LIGATION OF COMPETING BRANCHES AND SUPERFICIALIZATION OF RIGHT ARTERIOVENOUS FISTULA (Right: Arm Upper)     Patient location during evaluation: PACU Anesthesia Type: MAC Level of consciousness: awake and alert Pain management: pain level controlled Vital Signs Assessment: post-procedure vital signs reviewed and stable Respiratory status: spontaneous breathing, nonlabored ventilation, respiratory function stable and patient connected to nasal cannula oxygen Cardiovascular status: stable and blood pressure returned to baseline Postop Assessment: no apparent nausea or vomiting Anesthetic complications: no   No notable events documented.  Last Vitals:  Vitals:   11/14/20 1517 11/14/20 1534  BP: 128/89 (!) 122/98  Pulse: 84 81  Resp: 15 19  Temp:  36.7 C  SpO2: 95% 99%    Last Pain:  Vitals:   11/14/20 1534  TempSrc:   PainSc: 0-No pain                 Drayven Marchena COKER

## 2020-11-15 ENCOUNTER — Encounter (HOSPITAL_COMMUNITY): Payer: Self-pay | Admitting: Vascular Surgery

## 2020-11-15 LAB — POCT I-STAT, CHEM 8
BUN: 40 mg/dL — ABNORMAL HIGH (ref 6–20)
Calcium, Ion: 1.03 mmol/L — ABNORMAL LOW (ref 1.15–1.40)
Chloride: 100 mmol/L (ref 98–111)
Creatinine, Ser: 8.8 mg/dL — ABNORMAL HIGH (ref 0.44–1.00)
Glucose, Bld: 242 mg/dL — ABNORMAL HIGH (ref 70–99)
HCT: 37 % (ref 36.0–46.0)
Hemoglobin: 12.6 g/dL (ref 12.0–15.0)
Potassium: 4.2 mmol/L (ref 3.5–5.1)
Sodium: 136 mmol/L (ref 135–145)
TCO2: 24 mmol/L (ref 22–32)

## 2020-11-27 DIAGNOSIS — T7840XA Allergy, unspecified, initial encounter: Secondary | ICD-10-CM | POA: Insufficient documentation

## 2020-11-27 DIAGNOSIS — T782XXA Anaphylactic shock, unspecified, initial encounter: Secondary | ICD-10-CM | POA: Insufficient documentation

## 2020-12-20 ENCOUNTER — Other Ambulatory Visit: Payer: Self-pay

## 2020-12-20 ENCOUNTER — Ambulatory Visit (INDEPENDENT_AMBULATORY_CARE_PROVIDER_SITE_OTHER): Payer: Medicaid Other | Admitting: Physician Assistant

## 2020-12-20 VITALS — BP 113/73 | HR 90 | Temp 97.7°F | Resp 20 | Ht 62.0 in | Wt 212.4 lb

## 2020-12-20 DIAGNOSIS — Z992 Dependence on renal dialysis: Secondary | ICD-10-CM

## 2020-12-20 DIAGNOSIS — N186 End stage renal disease: Secondary | ICD-10-CM

## 2020-12-20 NOTE — Progress Notes (Signed)
    Postoperative Access Visit   History of Present Illness   Wanda Ramirez is a 50 y.o. year old female who presents for postoperative follow-up for: revision of right arm cephalic vein AV fistula with transposition 11/14/20 by Dr. Donzetta Matters. The patient's wounds are well healed.  The patient notes no steal symptoms.  The patient is able to complete their activities of daily living.    She is currently dialyzing via a [facility] on MWF  Physical Examination   Vitals:   12/20/20 1333  BP: 113/73  Pulse: 90  Resp: 20  Temp: 97.7 F (36.5 C)  TempSrc: Temporal  SpO2: 96%  Weight: 212 lb 6.4 oz (96.3 kg)  Height: '5\' 2"'$  (1.575 m)   Body mass index is 38.85 kg/m.  right arm Incision is well healed, 2+ radial pulse, hand grip is 5/5, sensation in digits is intact, palpable thrill, bruit can be auscultated     Medical Decision Making   Wanda Ramirez is a 50 y.o. year old female who presents s/p revision of right arm cephalic vein AV fistula with transposition 11/14/20 by Dr. Donzetta Matters. Her incisions have healed nicely  Patent is without signs or symptoms of steal syndrome The patient's access will be ready for use immediately The patient's tunneled dialysis catheter can be removed when Nephrology is comfortable with the performance of the right AV fistula The patient may follow up on a prn basis   Karoline Caldwell, PA-C [facility] [place]: [phone]  Clinic MD: Scot Dock

## 2021-01-01 ENCOUNTER — Other Ambulatory Visit: Payer: Self-pay

## 2021-01-01 DIAGNOSIS — N186 End stage renal disease: Secondary | ICD-10-CM

## 2021-01-01 DIAGNOSIS — Z992 Dependence on renal dialysis: Secondary | ICD-10-CM

## 2021-01-04 ENCOUNTER — Ambulatory Visit (INDEPENDENT_AMBULATORY_CARE_PROVIDER_SITE_OTHER)
Admission: RE | Admit: 2021-01-04 | Discharge: 2021-01-04 | Disposition: A | Discharge status: Home / Self Care | Payer: Medicaid Other | Source: Ambulatory Visit | Attending: Vascular Surgery | Admitting: Vascular Surgery

## 2021-01-04 ENCOUNTER — Ambulatory Visit (HOSPITAL_COMMUNITY)
Admission: RE | Admit: 2021-01-04 | Discharge: 2021-01-04 | Disposition: A | Discharge status: Home / Self Care | Payer: Medicaid Other | Source: Ambulatory Visit | Attending: Vascular Surgery | Admitting: Vascular Surgery

## 2021-01-04 ENCOUNTER — Other Ambulatory Visit: Payer: Self-pay

## 2021-01-04 ENCOUNTER — Ambulatory Visit (INDEPENDENT_AMBULATORY_CARE_PROVIDER_SITE_OTHER): Payer: Medicaid Other | Admitting: Vascular Surgery

## 2021-01-04 ENCOUNTER — Encounter: Payer: Self-pay | Admitting: Vascular Surgery

## 2021-01-04 VITALS — BP 154/92 | HR 75 | Temp 98.1°F | Resp 20 | Ht 62.0 in | Wt 218.0 lb

## 2021-01-04 DIAGNOSIS — N186 End stage renal disease: Secondary | ICD-10-CM | POA: Diagnosis present

## 2021-01-04 DIAGNOSIS — Z992 Dependence on renal dialysis: Secondary | ICD-10-CM | POA: Diagnosis not present

## 2021-01-04 NOTE — H&P (View-Only) (Signed)
Patient ID: Wanda Ramirez, female   DOB: 11/29/1970, 50 y.o.   MRN: [id number]  Reason for Consult: Follow-up   Referred by Wanda Mend, MD  Subjective:     HPI:  Wanda Ramirez is a 50 y.o. female history of end-stage renal disease currently on dialysis via left IJ catheter.  She previously has left upper extremity access performed by Dr. Maryjean Morn in [facility] which has failed.  I recently placed the right upper extremity fistula and revised that it is now thrombosed.  She denies any numbness or tingling in her hands.  She does not take any blood thinners.  She currently dialyzes Mondays, Wednesdays and Fridays.  Past Medical History:  Diagnosis Date   Anemia    Anxiety    Arthritis    Asthma    Chronic back pain    Diabetes mellitus (HCC)    Type II   ESRD (end stage renal disease) on dialysis (HCC)    TTS- [facility] since 2016   Essential hypertension    GERD (gastroesophageal reflux disease)    Glaucoma    History of blood transfusion    Hyperlipidemia    Hypothyroidism    Morbid obesity ([place])    Peripheral neuropathy    Sleep apnea    wears CPAP, does not know setting   Family History  Problem Relation Age of Onset   Diabetes Mother    Hyperlipidemia Mother    Kidney disease Father    Past Surgical History:  Procedure Laterality Date   AV FISTULA PLACEMENT Left 04/2014   AV FISTULA PLACEMENT Right 08/15/2020   Procedure: RIGHT BRACHIOCEPHALIC ARTERIOVENOUS (AV) FISTULA CREATION;  Surgeon: Waynetta Sandy, MD;  Location: [place];  Service: Vascular;  Laterality: Right;   CARDIAC CATHETERIZATION N/A 03/17/2016   Procedure: Left Heart Cath and Coronary Angiography;  Surgeon: [name], MD;  Location: [place] [facility];  Service: Cardiovascular;  Laterality: N/A;   INCISION AND DRAINAGE PERIRECTAL ABSCESS N/A 04/03/2020   Procedure: EXCISIONAL DEBRIDEMENT OF SACRAL AND GLUTEAL WOUNDS;  Surgeon: [name], MD;  Location:  [place];  Service: General;  Laterality: N/A;   IR FLUORO GUIDE CV LINE LEFT  02/22/2020   IR FLUORO GUIDE CV LINE LEFT  02/27/2020   IR FLUORO GUIDE CV LINE LEFT  05/30/2020   IR FLUORO GUIDE CV LINE LEFT  10/10/2020   IR FLUORO GUIDE CV LINE LEFT  10/12/2020   IR US GUIDE VASC ACCESS LEFT  02/22/2020   KNEE SURGERY     LIGATION OF COMPETING BRANCHES OF ARTERIOVENOUS FISTULA Right 11/14/2020   Procedure: LIGATION OF COMPETING BRANCHES AND SUPERFICIALIZATION OF RIGHT ARTERIOVENOUS FISTULA;  Surgeon: Waynetta Sandy, MD;  Location: [place];  Service: Vascular;  Laterality: Right;   TUBAL LIGATION      Short Social History:  Social History   Tobacco Use   Smoking status: Former   Smokeless tobacco: Never   Tobacco comments:    Smoked x 2 yrs  Substance Use Topics   Alcohol use: No    Comment: years ago - has since quit    Allergies  Allergen Reactions   Hydrocodone Itching and Nausea And Vomiting    Current Outpatient Medications  Medication Sig Dispense Refill   acetaminophen (TYLENOL) 325 MG tablet Take 1-2 tablets (325-650 mg total) by mouth every 4 (four) hours as needed for mild pain.     albuterol (VENTOLIN HFA) 108 (90 Base) MCG/ACT inhaler  Inhale 1-2 puffs into the lungs every 6 (six) hours as needed for wheezing or shortness of breath.     aspirin EC 81 MG tablet Take 81 mg by mouth in the morning. Swallow whole.     AURYXIA 1 GM 210 MG(Fe) tablet Take 630 mg by mouth 3 (three) times daily with meals.     B Complex-C (B-COMPLEX WITH VITAMIN C) tablet Take 1 tablet by mouth daily. (Patient taking differently: Take 1 tablet by mouth in the morning.) 30 tablet 0   Brinzolamide-Brimonidine (SIMBRINZA) 1-0.2 % SUSP Place 1 drop into both eyes in the morning, at noon, and at bedtime.     Dulaglutide (TRULICITY) A999333 0000000 SOPN Inject 0.75 mg into the skin every Tuesday.     fluticasone furoate-vilanterol (BREO ELLIPTA) 100-25 MCG/INH AEPB Inhale 1 puff into the lungs in  the morning.     gabapentin (NEURONTIN) 100 MG capsule Take 100 mg by mouth 2 (two) times daily.     insulin glargine (LANTUS) 100 unit/mL SOPN Inject 4 Units into the skin at bedtime. 15 mL 0   Latanoprostene Bunod (VYZULTA) 0.024 % SOLN Place 1 drop into both eyes in the morning, at noon, and at bedtime.     levothyroxine (SYNTHROID) 125 MCG tablet Take 1 tablet (125 mcg total) by mouth daily before breakfast. 30 tablet 0   lip balm (CARMEX) ointment Apply topically as needed for lip care. 7 g 0   midodrine (PROAMATINE) 10 MG tablet Take 1 tablet (10 mg total) by mouth 3 (three) times daily with meals. (Patient taking differently: Take 10 mg by mouth 3 (three) times daily as needed (low blood pressure).) 90 tablet 0   montelukast (SINGULAIR) 10 MG tablet Take 10 mg by mouth in the morning.     Netarsudil Dimesylate (RHOPRESSA) 0.02 % SOLN Place 1 drop into both eyes at bedtime.     Oxycodone HCl 10 MG TABS Take 10 mg by mouth 4 (four) times daily as needed (pain).     oxyCODONE-acetaminophen (PERCOCET) 5-325 MG tablet Take 1 tablet by mouth every 6 (six) hours as needed for severe pain. 20 tablet 0   OXYGEN Inhale 4 L into the lungs continuous.     pantoprazole (PROTONIX) 40 MG tablet Take 1 tablet (40 mg total) by mouth daily. (Patient taking differently: Take 40 mg by mouth in the morning.) 30 tablet 0   QUEtiapine (SEROQUEL) 50 MG tablet Take 1 tablet (50 mg total) by mouth at bedtime. 30 tablet 0   No current facility-administered medications for this visit.    Review of Systems  Constitutional:  Constitutional negative. HENT: HENT negative.  Eyes: Eyes negative.  Respiratory: Respiratory negative.  Cardiovascular: Cardiovascular negative.  GI: Gastrointestinal negative.  Musculoskeletal: Musculoskeletal negative.  Skin: Skin negative.  Neurological: Neurological negative. Hematologic: Hematologic/lymphatic negative.  Psychiatric: Psychiatric negative.       Objective:   Objective   Vitals:   01/04/21 0939  BP: (!) 154/92  Pulse: 75  Resp: 20  Temp: 98.1 F (36.7 C)  SpO2: 96%  Weight: 218 lb (98.9 kg)  Height: '5\' 2"'$  (1.575 m)   Body mass index is 39.87 kg/m.  Physical Exam HENT:     Head: Normocephalic.     Nose:     Comments: Wearing a mask Eyes:     Pupils: Pupils are equal, round, and reactive to light.  Cardiovascular:     Comments: Palpable ulnar pulses bilaterally Pulmonary:     Effort: Pulmonary  effort is normal.  Abdominal:     General: Abdomen is flat.  Musculoskeletal:        General: Normal range of motion.  Skin:    General: Skin is warm and dry.     Capillary Refill: Capillary refill takes less than 2 seconds.  Neurological:     Mental Status: She is alert.  Psychiatric:        Mood and Affect: Mood normal.        Behavior: Behavior normal.        Thought Content: Thought content normal.    Data: Right Pre-Dialysis Findings:  +-----------------------+----------+--------------------+---------+--------  +  Location               PSV (cm/s)Intralum. Diam. (cm)Waveform  Comments  +-----------------------+----------+--------------------+---------+--------  +  Brachial Antecub. fossa96        0.45                triphasic            +-----------------------+----------+--------------------+---------+--------  +  Radial Art at Wrist    69        0.27                triphasic            +-----------------------+----------+--------------------+---------+--------  +  Ulnar Art at Wrist     70        0.26                triphasic            +-----------------------+----------+--------------------+---------+--------  +  Left Pre-Dialysis Findings:  +-----------------------+----------+--------------------+---------+--------  +  Location               PSV (cm/s)Intralum. Diam. (cm)Waveform  Comments  +-----------------------+----------+--------------------+---------+--------  +   Brachial Antecub. fossa91        0.47                triphasic            +-----------------------+----------+--------------------+---------+--------  +  Radial Art at Wrist    68        0.23                triphasic            +-----------------------+----------+--------------------+---------+--------  +  Ulnar Art at Wrist     28        0.19                triphasic            +-----------------------+----------+--------------------+---------+--------  +   +--------------+-------------+----------+--------+  Right CephalicDiameter (cm)Depth (cm)Findings  +--------------+-------------+----------+--------+  Prox forearm      0.23                         +--------------+-------------+----------+--------+  Mid forearm       0.20                         +--------------+-------------+----------+--------+   +-----------------+-------------+----------+--------+  Right Basilic    Diameter (cm)Depth (cm)Findings  +-----------------+-------------+----------+--------+  Mid upper arm        0.17                         +-----------------+-------------+----------+--------+  Dist upper arm       0.37                         +-----------------+-------------+----------+--------+  Antecubital fossa    0.36                         +-----------------+-------------+----------+--------+   +-------------+-------------+----------+--------+  Left CephalicDiameter (cm)Depth (cm)Findings  +-------------+-------------+----------+--------+  Shoulder         0.23                         +-------------+-------------+----------+--------+  Prox forearm     0.20                         +-------------+-------------+----------+--------+  Mid forearm      0.20                         +-------------+-------------+----------+--------+   +-----------------+-------------+----------+--------+  Left Basilic     Diameter (cm)Depth  (cm)Findings  +-----------------+-------------+----------+--------+  Mid upper arm        0.23                         +-----------------+-------------+----------+--------+  Dist upper arm       0.38                         +-----------------+-------------+----------+--------+  Antecubital fossa    0.38                         +-----------------+-------------+----------+--------+        Assessment/Plan:    50 year old female with end-stage renal disease currently on dialysis via catheter.  She has no permanent access having recently thrombosed right upper extremity fistula.  We will plan for bilateral upper extremity venography on a nondialysis day in the near future.  From there we will plan upper extremity access.  I do not think given her body habitus she would be a good candidate for femoral graft.  We discussed the procedural details with her today and she demonstrates good understanding we will get her scheduled.     Waynetta Sandy MD [facility]

## 2021-01-04 NOTE — Progress Notes (Signed)
Patient ID: Wanda Ramirez, female   DOB: [birth year], 50 y.o.   MRN: [id number]  Reason for Consult: Follow-up   Referred by Justin Mend, MD  Subjective:     HPI:  Wanda Ramirez is a 50 y.o. female history of end-stage renal disease currently on dialysis via left IJ catheter.  She previously has left upper extremity access performed by Dr. Maryjean Morn in [facility] which has failed.  I recently placed the right upper extremity fistula and revised that it is now thrombosed.  She denies any numbness or tingling in her hands.  She does not take any blood thinners.  She currently dialyzes Mondays, Wednesdays and Fridays.  Past Medical History:  Diagnosis Date   Anemia    Anxiety    Arthritis    Asthma    Chronic back pain    Diabetes mellitus (HCC)    Type II   ESRD (end stage renal disease) on dialysis (HCC)    TTS- [facility] since 2016   Essential hypertension    GERD (gastroesophageal reflux disease)    Glaucoma    History of blood transfusion    Hyperlipidemia    Hypothyroidism    Morbid obesity ([place])    Peripheral neuropathy    Sleep apnea    wears CPAP, does not know setting   Family History  Problem Relation Age of Onset   Diabetes Mother    Hyperlipidemia Mother    Kidney disease Father    Past Surgical History:  Procedure Laterality Date   AV FISTULA PLACEMENT Left 04/2014   AV FISTULA PLACEMENT Right 08/15/2020   Procedure: RIGHT BRACHIOCEPHALIC ARTERIOVENOUS (AV) FISTULA CREATION;  Surgeon: Waynetta Sandy, MD;  Location: [place];  Service: Vascular;  Laterality: Right;   CARDIAC CATHETERIZATION N/A 03/17/2016   Procedure: Left Heart Cath and Coronary Angiography;  Surgeon: [name], MD;  Location: [place] CV [facility];  Service: Cardiovascular;  Laterality: N/A;   INCISION AND DRAINAGE PERIRECTAL ABSCESS N/A 04/03/2020   Procedure: EXCISIONAL DEBRIDEMENT OF SACRAL AND GLUTEAL WOUNDS;  Surgeon: [name], MD;  Location:  [place];  Service: General;  Laterality: N/A;   IR FLUORO GUIDE CV LINE LEFT  02/22/2020   IR FLUORO GUIDE CV LINE LEFT  02/27/2020   IR FLUORO GUIDE CV LINE LEFT  05/30/2020   IR FLUORO GUIDE CV LINE LEFT  10/10/2020   IR FLUORO GUIDE CV LINE LEFT  10/12/2020   IR US GUIDE VASC ACCESS LEFT  02/22/2020   KNEE SURGERY     LIGATION OF COMPETING BRANCHES OF ARTERIOVENOUS FISTULA Right 11/14/2020   Procedure: LIGATION OF COMPETING BRANCHES AND SUPERFICIALIZATION OF RIGHT ARTERIOVENOUS FISTULA;  Surgeon: Waynetta Sandy, MD;  Location: [place];  Service: Vascular;  Laterality: Right;   TUBAL LIGATION      Short Social History:  Social History   Tobacco Use   Smoking status: Former   Smokeless tobacco: Never   Tobacco comments:    Smoked x 2 yrs  Substance Use Topics   Alcohol use: No    Comment: years ago - has since quit    Allergies  Allergen Reactions   Hydrocodone Itching and Nausea And Vomiting    Current Outpatient Medications  Medication Sig Dispense Refill   acetaminophen (TYLENOL) 325 MG tablet Take 1-2 tablets (325-650 mg total) by mouth every 4 (four) hours as needed for mild pain.     albuterol (VENTOLIN HFA) 108 (90 Base) MCG/ACT inhaler  Inhale 1-2 puffs into the lungs every 6 (six) hours as needed for wheezing or shortness of breath.     aspirin EC 81 MG tablet Take 81 mg by mouth in the morning. Swallow whole.     AURYXIA 1 GM 210 MG(Fe) tablet Take 630 mg by mouth 3 (three) times daily with meals.     B Complex-C (B-COMPLEX WITH VITAMIN C) tablet Take 1 tablet by mouth daily. (Patient taking differently: Take 1 tablet by mouth in the morning.) 30 tablet 0   Brinzolamide-Brimonidine (SIMBRINZA) 1-0.2 % SUSP Place 1 drop into both eyes in the morning, at noon, and at bedtime.     Dulaglutide (TRULICITY) A999333 0000000 SOPN Inject 0.75 mg into the skin every Tuesday.     fluticasone furoate-vilanterol (BREO ELLIPTA) 100-25 MCG/INH AEPB Inhale 1 puff into the lungs in  the morning.     gabapentin (NEURONTIN) 100 MG capsule Take 100 mg by mouth 2 (two) times daily.     insulin glargine (LANTUS) 100 unit/mL SOPN Inject 4 Units into the skin at bedtime. 15 mL 0   Latanoprostene Bunod (VYZULTA) 0.024 % SOLN Place 1 drop into both eyes in the morning, at noon, and at bedtime.     levothyroxine (SYNTHROID) 125 MCG tablet Take 1 tablet (125 mcg total) by mouth daily before breakfast. 30 tablet 0   lip balm (CARMEX) ointment Apply topically as needed for lip care. 7 g 0   midodrine (PROAMATINE) 10 MG tablet Take 1 tablet (10 mg total) by mouth 3 (three) times daily with meals. (Patient taking differently: Take 10 mg by mouth 3 (three) times daily as needed (low blood pressure).) 90 tablet 0   montelukast (SINGULAIR) 10 MG tablet Take 10 mg by mouth in the morning.     Netarsudil Dimesylate (RHOPRESSA) 0.02 % SOLN Place 1 drop into both eyes at bedtime.     Oxycodone HCl 10 MG TABS Take 10 mg by mouth 4 (four) times daily as needed (pain).     oxyCODONE-acetaminophen (PERCOCET) 5-325 MG tablet Take 1 tablet by mouth every 6 (six) hours as needed for severe pain. 20 tablet 0   OXYGEN Inhale 4 L into the lungs continuous.     pantoprazole (PROTONIX) 40 MG tablet Take 1 tablet (40 mg total) by mouth daily. (Patient taking differently: Take 40 mg by mouth in the morning.) 30 tablet 0   QUEtiapine (SEROQUEL) 50 MG tablet Take 1 tablet (50 mg total) by mouth at bedtime. 30 tablet 0   No current facility-administered medications for this visit.    Review of Systems  Constitutional:  Constitutional negative. HENT: HENT negative.  Eyes: Eyes negative.  Respiratory: Respiratory negative.  Cardiovascular: Cardiovascular negative.  GI: Gastrointestinal negative.  Musculoskeletal: Musculoskeletal negative.  Skin: Skin negative.  Neurological: Neurological negative. Hematologic: Hematologic/lymphatic negative.  Psychiatric: Psychiatric negative.       Objective:   Objective   Vitals:   01/04/21 0939  BP: (!) 154/92  Pulse: 75  Resp: 20  Temp: 98.1 F (36.7 C)  SpO2: 96%  Weight: 218 lb (98.9 kg)  Height: '5\' 2"'$  (1.575 m)   Body mass index is 39.87 kg/m.  Physical Exam HENT:     Head: Normocephalic.     Nose:     Comments: Wearing a mask Eyes:     Pupils: Pupils are equal, round, and reactive to light.  Cardiovascular:     Comments: Palpable ulnar pulses bilaterally Pulmonary:     Effort: Pulmonary  effort is normal.  Abdominal:     General: Abdomen is flat.  Musculoskeletal:        General: Normal range of motion.  Skin:    General: Skin is warm and dry.     Capillary Refill: Capillary refill takes less than 2 seconds.  Neurological:     Mental Status: She is alert.  Psychiatric:        Mood and Affect: Mood normal.        Behavior: Behavior normal.        Thought Content: Thought content normal.    Data: Right Pre-Dialysis Findings:  +-----------------------+----------+--------------------+---------+--------  +  Location               PSV (cm/s)Intralum. Diam. (cm)Waveform  Comments  +-----------------------+----------+--------------------+---------+--------  +  Brachial Antecub. fossa96        0.45                triphasic            +-----------------------+----------+--------------------+---------+--------  +  Radial Art at Wrist    69        0.27                triphasic            +-----------------------+----------+--------------------+---------+--------  +  Ulnar Art at Wrist     70        0.26                triphasic            +-----------------------+----------+--------------------+---------+--------  +  Left Pre-Dialysis Findings:  +-----------------------+----------+--------------------+---------+--------  +  Location               PSV (cm/s)Intralum. Diam. (cm)Waveform  Comments  +-----------------------+----------+--------------------+---------+--------  +   Brachial Antecub. fossa91        0.47                triphasic            +-----------------------+----------+--------------------+---------+--------  +  Radial Art at Wrist    68        0.23                triphasic            +-----------------------+----------+--------------------+---------+--------  +  Ulnar Art at Wrist     28        0.19                triphasic            +-----------------------+----------+--------------------+---------+--------  +   +--------------+-------------+----------+--------+  Right CephalicDiameter (cm)Depth (cm)Findings  +--------------+-------------+----------+--------+  Prox forearm      0.23                         +--------------+-------------+----------+--------+  Mid forearm       0.20                         +--------------+-------------+----------+--------+   +-----------------+-------------+----------+--------+  Right Basilic    Diameter (cm)Depth (cm)Findings  +-----------------+-------------+----------+--------+  Mid upper arm        0.17                         +-----------------+-------------+----------+--------+  Dist upper arm       0.37                         +-----------------+-------------+----------+--------+  Antecubital fossa    0.36                         +-----------------+-------------+----------+--------+   +-------------+-------------+----------+--------+  Left CephalicDiameter (cm)Depth (cm)Findings  +-------------+-------------+----------+--------+  Shoulder         0.23                         +-------------+-------------+----------+--------+  Prox forearm     0.20                         +-------------+-------------+----------+--------+  Mid forearm      0.20                         +-------------+-------------+----------+--------+   +-----------------+-------------+----------+--------+  Left Basilic     Diameter (cm)Depth  (cm)Findings  +-----------------+-------------+----------+--------+  Mid upper arm        0.23                         +-----------------+-------------+----------+--------+  Dist upper arm       0.38                         +-----------------+-------------+----------+--------+  Antecubital fossa    0.38                         +-----------------+-------------+----------+--------+        Assessment/Plan:    50 year old female with end-stage renal disease currently on dialysis via catheter.  She has no permanent access having recently thrombosed right upper extremity fistula.  We will plan for bilateral upper extremity venography on a nondialysis day in the near future.  From there we will plan upper extremity access.  I do not think given her body habitus she would be a good candidate for femoral graft.  We discussed the procedural details with her today and she demonstrates good understanding we will get her scheduled.     Waynetta Sandy MD [facility]

## 2021-01-04 NOTE — H&P (View-Only) (Signed)
Patient ID: Wanda Ramirez, female   DOB: September 27, 1970, 50 y.o.   MRN: [id number]  Reason for Consult: Follow-up   Referred by Justin Mend, MD  Subjective:     HPI:  Wanda Ramirez is a 50 y.o. female history of end-stage renal disease currently on dialysis via left IJ catheter.  She previously has left upper extremity access performed by Dr. Maryjean Morn in [facility] which has failed.  I recently placed the right upper extremity fistula and revised that it is now thrombosed.  She denies any numbness or tingling in her hands.  She does not take any blood thinners.  She currently dialyzes Mondays, Wednesdays and Fridays.  Past Medical History:  Diagnosis Date   Anemia    Anxiety    Arthritis    Asthma    Chronic back pain    Diabetes mellitus (HCC)    Type II   ESRD (end stage renal disease) on dialysis (HCC)    TTS- [facility] since 2016   Essential hypertension    GERD (gastroesophageal reflux disease)    Glaucoma    History of blood transfusion    Hyperlipidemia    Hypothyroidism    Morbid obesity ([place])    Peripheral neuropathy    Sleep apnea    wears CPAP, does not know setting   Family History  Problem Relation Age of Onset   Diabetes Mother    Hyperlipidemia Mother    Kidney disease Father    Past Surgical History:  Procedure Laterality Date   AV FISTULA PLACEMENT Left 04/2014   AV FISTULA PLACEMENT Right 08/15/2020   Procedure: RIGHT BRACHIOCEPHALIC ARTERIOVENOUS (AV) FISTULA CREATION;  Surgeon: Waynetta Sandy, MD;  Location: [place];  Service: Vascular;  Laterality: Right;   CARDIAC CATHETERIZATION N/A 03/17/2016   Procedure: Left Heart Cath and Coronary Angiography;  Surgeon: [name], MD;  Location: [facility];  Service: Cardiovascular;  Laterality: N/A;   INCISION AND DRAINAGE PERIRECTAL ABSCESS N/A 04/03/2020   Procedure: EXCISIONAL DEBRIDEMENT OF SACRAL AND GLUTEAL WOUNDS;  Surgeon: [name], MD;  Location:  [place];  Service: General;  Laterality: N/A;   IR FLUORO GUIDE CV LINE LEFT  02/22/2020   IR FLUORO GUIDE CV LINE LEFT  02/27/2020   IR FLUORO GUIDE CV LINE LEFT  05/30/2020   IR FLUORO GUIDE CV LINE LEFT  10/10/2020   IR FLUORO GUIDE CV LINE LEFT  10/12/2020   IR US GUIDE VASC ACCESS LEFT  02/22/2020   KNEE SURGERY     LIGATION OF COMPETING BRANCHES OF ARTERIOVENOUS FISTULA Right 11/14/2020   Procedure: LIGATION OF COMPETING BRANCHES AND SUPERFICIALIZATION OF RIGHT ARTERIOVENOUS FISTULA;  Surgeon: Waynetta Sandy, MD;  Location: [place];  Service: Vascular;  Laterality: Right;   TUBAL LIGATION      Short Social History:  Social History   Tobacco Use   Smoking status: Former   Smokeless tobacco: Never   Tobacco comments:    Smoked x 2 yrs  Substance Use Topics   Alcohol use: No    Comment: years ago - has since quit    Allergies  Allergen Reactions   Hydrocodone Itching and Nausea And Vomiting    Current Outpatient Medications  Medication Sig Dispense Refill   acetaminophen (TYLENOL) 325 MG tablet Take 1-2 tablets (325-650 mg total) by mouth every 4 (four) hours as needed for mild pain.     albuterol (VENTOLIN HFA) 108 (90 Base) MCG/ACT inhaler  Inhale 1-2 puffs into the lungs every 6 (six) hours as needed for wheezing or shortness of breath.     aspirin EC 81 MG tablet Take 81 mg by mouth in the morning. Swallow whole.     AURYXIA 1 GM 210 MG(Fe) tablet Take 630 mg by mouth 3 (three) times daily with meals.     B Complex-C (B-COMPLEX WITH VITAMIN C) tablet Take 1 tablet by mouth daily. (Patient taking differently: Take 1 tablet by mouth in the morning.) 30 tablet 0   Brinzolamide-Brimonidine (SIMBRINZA) 1-0.2 % SUSP Place 1 drop into both eyes in the morning, at noon, and at bedtime.     Dulaglutide (TRULICITY) [id number] [id number] SOPN Inject 0.75 mg into the skin every Tuesday.     fluticasone furoate-vilanterol (BREO ELLIPTA) 100-25 MCG/INH AEPB Inhale 1 puff into the lungs in  the morning.     gabapentin (NEURONTIN) 100 MG capsule Take 100 mg by mouth 2 (two) times daily.     insulin glargine (LANTUS) 100 unit/mL SOPN Inject 4 Units into the skin at bedtime. 15 mL 0   Latanoprostene Bunod (VYZULTA) 0.024 % SOLN Place 1 drop into both eyes in the morning, at noon, and at bedtime.     levothyroxine (SYNTHROID) 125 MCG tablet Take 1 tablet (125 mcg total) by mouth daily before breakfast. 30 tablet 0   lip balm (CARMEX) ointment Apply topically as needed for lip care. 7 g 0   midodrine (PROAMATINE) 10 MG tablet Take 1 tablet (10 mg total) by mouth 3 (three) times daily with meals. (Patient taking differently: Take 10 mg by mouth 3 (three) times daily as needed (low blood pressure).) 90 tablet 0   montelukast (SINGULAIR) 10 MG tablet Take 10 mg by mouth in the morning.     Netarsudil Dimesylate (RHOPRESSA) 0.02 % SOLN Place 1 drop into both eyes at bedtime.     Oxycodone HCl 10 MG TABS Take 10 mg by mouth 4 (four) times daily as needed (pain).     oxyCODONE-acetaminophen (PERCOCET) 5-325 MG tablet Take 1 tablet by mouth every 6 (six) hours as needed for severe pain. 20 tablet 0   OXYGEN Inhale 4 L into the lungs continuous.     pantoprazole (PROTONIX) 40 MG tablet Take 1 tablet (40 mg total) by mouth daily. (Patient taking differently: Take 40 mg by mouth in the morning.) 30 tablet 0   QUEtiapine (SEROQUEL) 50 MG tablet Take 1 tablet (50 mg total) by mouth at bedtime. 30 tablet 0   No current facility-administered medications for this visit.    Review of Systems  Constitutional:  Constitutional negative. HENT: HENT negative.  Eyes: Eyes negative.  Respiratory: Respiratory negative.  Cardiovascular: Cardiovascular negative.  GI: Gastrointestinal negative.  Musculoskeletal: Musculoskeletal negative.  Skin: Skin negative.  Neurological: Neurological negative. Hematologic: Hematologic/lymphatic negative.  Psychiatric: Psychiatric negative.       Objective:   Objective   Vitals:   01/04/21 0939  BP: (!) 154/92  Pulse: 75  Resp: 20  Temp: 98.1 F (36.7 C)  SpO2: 96%  Weight: 218 lb (98.9 kg)  Height: '5\' 2"'$  (1.575 m)   Body mass index is 39.87 kg/m.  Physical Exam HENT:     Head: Normocephalic.     Nose:     Comments: Wearing a mask Eyes:     Pupils: Pupils are equal, round, and reactive to light.  Cardiovascular:     Comments: Palpable ulnar pulses bilaterally Pulmonary:     Effort: Pulmonary  effort is normal.  Abdominal:     General: Abdomen is flat.  Musculoskeletal:        General: Normal range of motion.  Skin:    General: Skin is warm and dry.     Capillary Refill: Capillary refill takes less than 2 seconds.  Neurological:     Mental Status: She is alert.  Psychiatric:        Mood and Affect: Mood normal.        Behavior: Behavior normal.        Thought Content: Thought content normal.    Data: Right Pre-Dialysis Findings:  +-----------------------+----------+--------------------+---------+--------  +  Location               PSV (cm/s)Intralum. Diam. (cm)Waveform  Comments  +-----------------------+----------+--------------------+---------+--------  +  Brachial Antecub. [id number]        0.45                triphasic            +-----------------------+----------+--------------------+---------+--------  +  Radial Art at Wrist    69        0.27                triphasic            +-----------------------+----------+--------------------+---------+--------  +  Ulnar Art at Wrist     70        0.26                triphasic            +-----------------------+----------+--------------------+---------+--------  +  Left Pre-Dialysis Findings:  +-----------------------+----------+--------------------+---------+--------  +  Location               PSV (cm/s)Intralum. Diam. (cm)Waveform  Comments  +-----------------------+----------+--------------------+---------+--------  +   Brachial Antecub. [id number]        0.47                triphasic            +-----------------------+----------+--------------------+---------+--------  +  Radial Art at Wrist    68        0.23                triphasic            +-----------------------+----------+--------------------+---------+--------  +  Ulnar Art at Wrist     28        0.19                triphasic            +-----------------------+----------+--------------------+---------+--------  +   +--------------+-------------+----------+--------+  Right CephalicDiameter (cm)Depth (cm)Findings  +--------------+-------------+----------+--------+  Prox forearm      0.23                         +--------------+-------------+----------+--------+  Mid forearm       0.20                         +--------------+-------------+----------+--------+   +-----------------+-------------+----------+--------+  Right Basilic    Diameter (cm)Depth (cm)Findings  +-----------------+-------------+----------+--------+  Mid upper arm        0.17                         +-----------------+-------------+----------+--------+  Dist upper arm       0.37                         +-----------------+-------------+----------+--------+  Antecubital fossa    0.36                         +-----------------+-------------+----------+--------+   +-------------+-------------+----------+--------+  Left CephalicDiameter (cm)Depth (cm)Findings  +-------------+-------------+----------+--------+  Shoulder         0.23                         +-------------+-------------+----------+--------+  Prox forearm     0.20                         +-------------+-------------+----------+--------+  Mid forearm      0.20                         +-------------+-------------+----------+--------+   +-----------------+-------------+----------+--------+  Left Basilic     Diameter (cm)Depth  (cm)Findings  +-----------------+-------------+----------+--------+  Mid upper arm        0.23                         +-----------------+-------------+----------+--------+  Dist upper arm       0.38                         +-----------------+-------------+----------+--------+  Antecubital fossa    0.38                         +-----------------+-------------+----------+--------+        Assessment/Plan:    50 year old female with end-stage renal disease currently on dialysis via catheter.  She has no permanent access having recently thrombosed right upper extremity fistula.  We will plan for bilateral upper extremity venography on a nondialysis day in the near future.  From there we will plan upper extremity access.  I do not think given her body habitus she would be a good candidate for femoral graft.  We discussed the procedural details with her today and she demonstrates good understanding we will get her scheduled.     Waynetta Sandy MD [facility]

## 2021-01-07 DIAGNOSIS — R6883 Chills (without fever): Secondary | ICD-10-CM | POA: Insufficient documentation

## 2021-01-14 ENCOUNTER — Ambulatory Visit (HOSPITAL_COMMUNITY)
Admission: RE | Admit: 2021-01-14 | Discharge: 2021-01-14 | Disposition: A | Discharge status: Home / Self Care | Payer: Medicaid Other | Attending: Vascular Surgery | Admitting: Vascular Surgery

## 2021-01-14 ENCOUNTER — Other Ambulatory Visit: Payer: Self-pay

## 2021-01-14 ENCOUNTER — Encounter (HOSPITAL_COMMUNITY): Payer: Self-pay | Admitting: Vascular Surgery

## 2021-01-14 ENCOUNTER — Encounter (HOSPITAL_COMMUNITY)
Admission: RE | Disposition: A | Discharge status: Home / Self Care | Payer: Self-pay | Source: Home / Self Care | Attending: Vascular Surgery

## 2021-01-14 DIAGNOSIS — N186 End stage renal disease: Secondary | ICD-10-CM | POA: Insufficient documentation

## 2021-01-14 DIAGNOSIS — Z833 Family history of diabetes mellitus: Secondary | ICD-10-CM | POA: Diagnosis not present

## 2021-01-14 DIAGNOSIS — E1122 Type 2 diabetes mellitus with diabetic chronic kidney disease: Secondary | ICD-10-CM | POA: Insufficient documentation

## 2021-01-14 DIAGNOSIS — Z79899 Other long term (current) drug therapy: Secondary | ICD-10-CM | POA: Insufficient documentation

## 2021-01-14 DIAGNOSIS — T82898A Other specified complication of vascular prosthetic devices, implants and grafts, initial encounter: Secondary | ICD-10-CM | POA: Diagnosis not present

## 2021-01-14 DIAGNOSIS — Z885 Allergy status to narcotic agent status: Secondary | ICD-10-CM | POA: Diagnosis not present

## 2021-01-14 DIAGNOSIS — Z7982 Long term (current) use of aspirin: Secondary | ICD-10-CM | POA: Insufficient documentation

## 2021-01-14 DIAGNOSIS — Z992 Dependence on renal dialysis: Secondary | ICD-10-CM | POA: Insufficient documentation

## 2021-01-14 DIAGNOSIS — Z87891 Personal history of nicotine dependence: Secondary | ICD-10-CM | POA: Insufficient documentation

## 2021-01-14 DIAGNOSIS — Z794 Long term (current) use of insulin: Secondary | ICD-10-CM | POA: Diagnosis not present

## 2021-01-14 DIAGNOSIS — I120 Hypertensive chronic kidney disease with stage 5 chronic kidney disease or end stage renal disease: Secondary | ICD-10-CM | POA: Diagnosis not present

## 2021-01-14 HISTORY — PX: UPPER EXTREMITY VENOGRAPHY: CATH118272

## 2021-01-14 LAB — POCT I-STAT, CHEM 8
BUN: 67 mg/dL — ABNORMAL HIGH (ref 6–20)
Calcium, Ion: 1 mmol/L — ABNORMAL LOW (ref 1.15–1.40)
Chloride: 100 mmol/L (ref 98–111)
Creatinine, Ser: 13.3 mg/dL — ABNORMAL HIGH (ref 0.44–1.00)
Glucose, Bld: 195 mg/dL — ABNORMAL HIGH (ref 70–99)
HCT: 25 % — ABNORMAL LOW (ref 36.0–46.0)
Hemoglobin: 8.5 g/dL — ABNORMAL LOW (ref 12.0–15.0)
Potassium: 5.1 mmol/L (ref 3.5–5.1)
Sodium: 135 mmol/L (ref 135–145)
TCO2: 24 mmol/L (ref 22–32)

## 2021-01-14 LAB — GLUCOSE, CAPILLARY: Glucose-Capillary: 153 mg/dL — ABNORMAL HIGH (ref 70–99)

## 2021-01-14 SURGERY — UPPER EXTREMITY VENOGRAPHY
Anesthesia: LOCAL | Laterality: Bilateral

## 2021-01-14 MED ORDER — SODIUM CHLORIDE 0.9 % IV SOLN: 250.0000 mL | INTRAVENOUS | Status: DC | PRN

## 2021-01-14 MED ORDER — IODIXANOL 320 MG/ML IV SOLN
INTRAVENOUS | Status: DC | PRN
  Administered 2021-01-14: 40 mL via INTRAVENOUS

## 2021-01-14 MED ORDER — SODIUM CHLORIDE 0.9% FLUSH: 3.0000 mL | Freq: Two times a day (BID) | INTRAVENOUS | Status: DC

## 2021-01-14 MED ORDER — SODIUM CHLORIDE 0.9% FLUSH: 3.0000 mL | INTRAVENOUS | Status: DC | PRN

## 2021-01-14 SURGICAL SUPPLY — 2 items
STOPCOCK MORSE 400PSI 3WAY (MISCELLANEOUS) ×4 IMPLANT
TUBING CIL FLEX 10 FLL-RA (TUBING) ×2 IMPLANT

## 2021-01-14 NOTE — Interval H&P Note (Signed)
History and Physical Interval Note:  01/14/2021 9:25 AM  Wanda Ramirez  has presented today for surgery, with the diagnosis of END STAGE RENAL.  The various methods of treatment have been discussed with the patient and family. After consideration of risks, benefits and other options for treatment, the patient has consented to  Procedure(s): UPPER EXTREMITY VENOGRAPHY (Bilateral) as a surgical intervention.  The patient's history has been reviewed, patient examined, no change in status, stable for surgery.  I have reviewed the patient's chart and labs.  Questions were answered to the patient's satisfaction.     Servando Snare

## 2021-01-14 NOTE — Op Note (Signed)
    Patient name: Wanda Ramirez MRN: [id number] DOB: 12/27/1970 Sex: female  01/14/2021 Pre-operative Diagnosis: End-stage renal disease Post-operative diagnosis:  Same Surgeon:  [name] C. Donzetta Matters, MD Procedure Performed: Bilateral upper extremity venography  Indications: 50 year old female with failed bilateral upper extremity arteriovenous access.  She is not a good candidate for lower extremity access.  She is now indicated for bilateral upper extremity venography.  Findings: Bilateral upper extremity veins were small.  The right side axillary and subclavian veins emptied centrally much more brisk than the left side.  We will plan for right upper extremity AV graft on a nondialysis day in the near future.   Procedure:  The patient was identified in the holding area and taken to room 8.  The patient was then placed supine on the table and timeout was called.  The previous lead placed intravenous accesses were accessed and bilateral per extremity venography was performed with the above findings.  Contrast: 40cc     Mihika Surrette C. Donzetta Matters, MD [facility] Office: [phone] Pager: [phone]

## 2021-01-15 ENCOUNTER — Other Ambulatory Visit: Payer: Self-pay

## 2021-01-15 NOTE — Progress Notes (Signed)
error 

## 2021-01-16 ENCOUNTER — Other Ambulatory Visit: Payer: Self-pay | Admitting: Student

## 2021-01-16 ENCOUNTER — Other Ambulatory Visit (HOSPITAL_COMMUNITY): Payer: Self-pay | Admitting: Nurse Practitioner

## 2021-01-16 DIAGNOSIS — N186 End stage renal disease: Secondary | ICD-10-CM

## 2021-01-16 IMAGING — DX DG CHEST 2V
2 series · 2 of 2 positions shown · non-contrast
Comparison: 04/03/2020

CLINICAL DATA: Leukocytosis

EXAM:
CHEST - 2 VIEW

[chest lat]
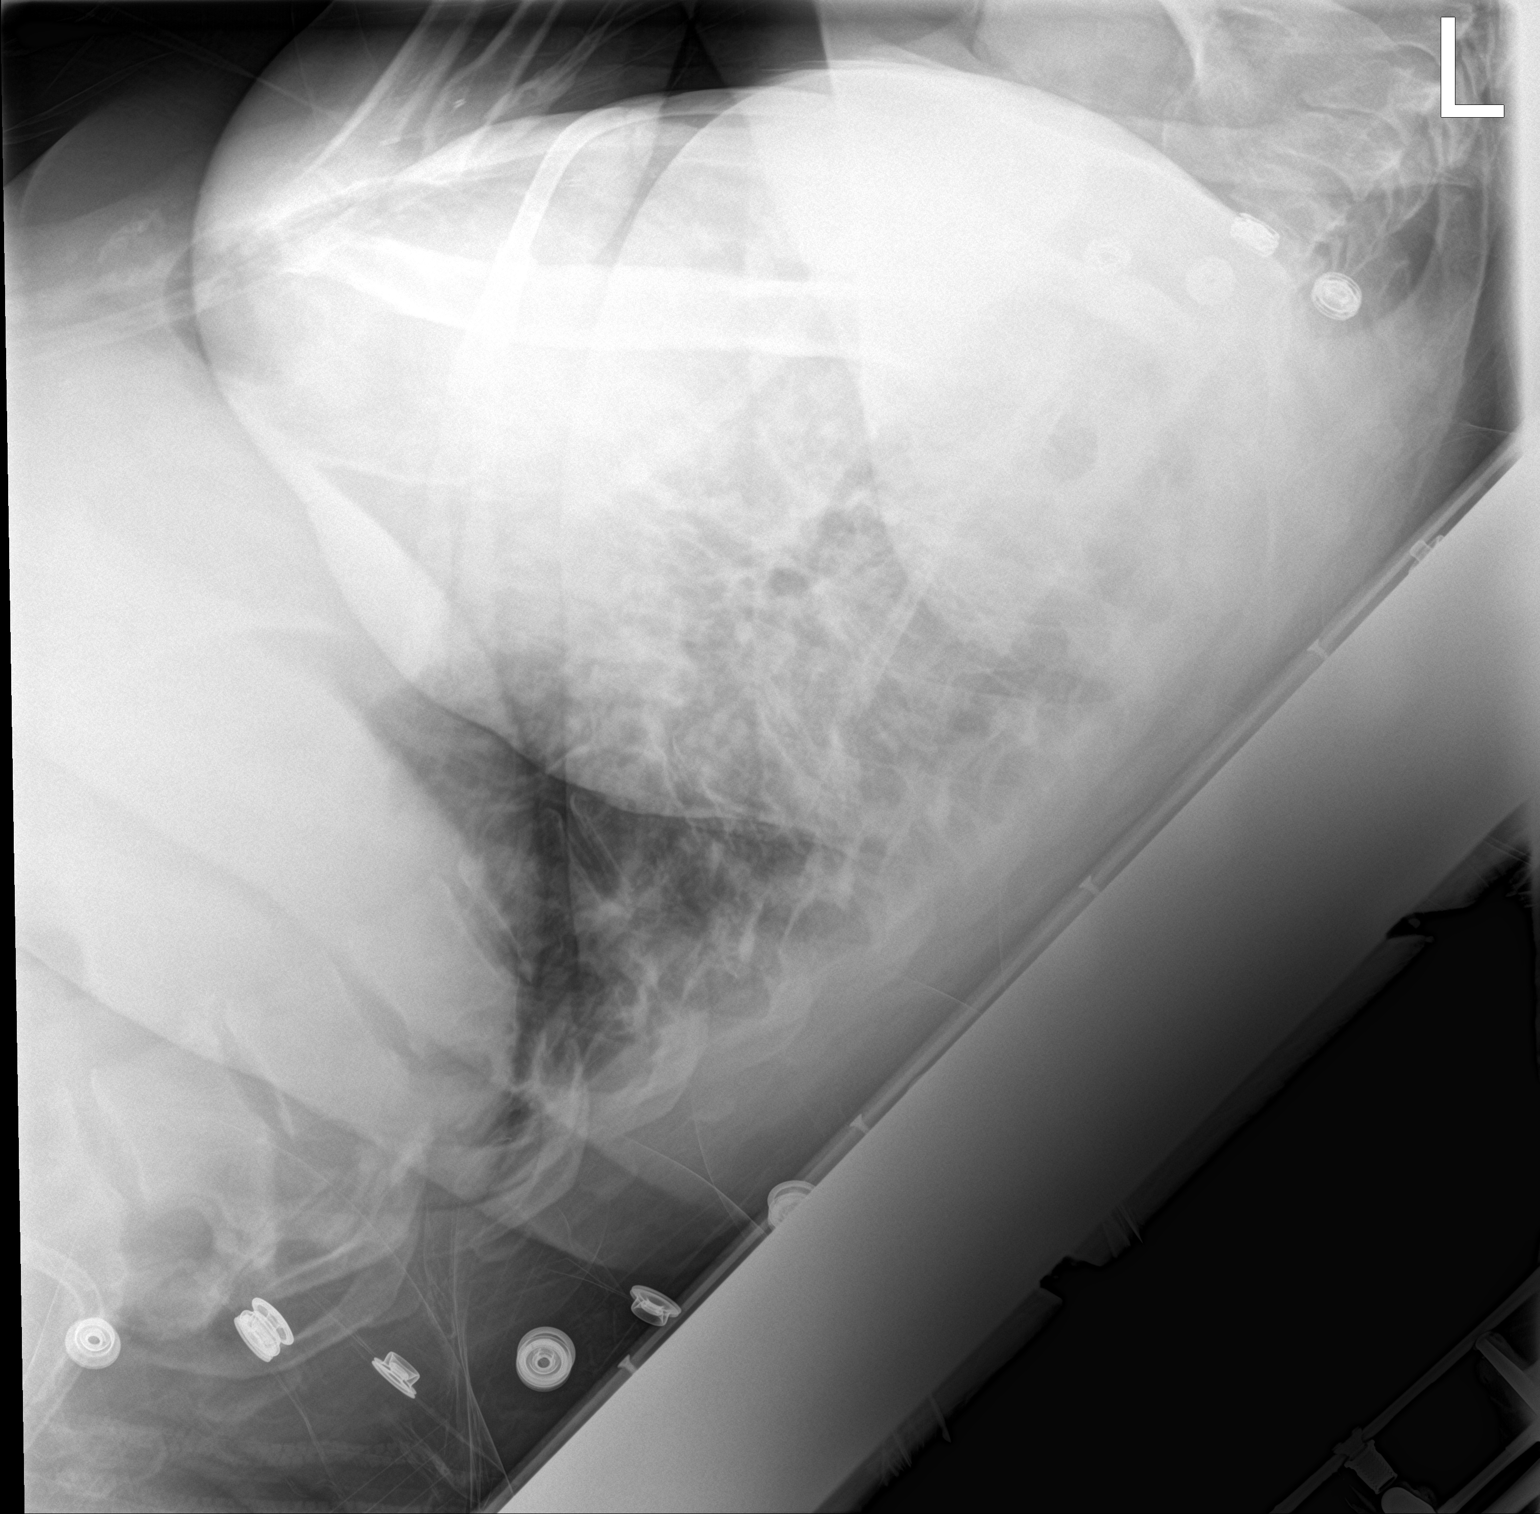

[chest ap]
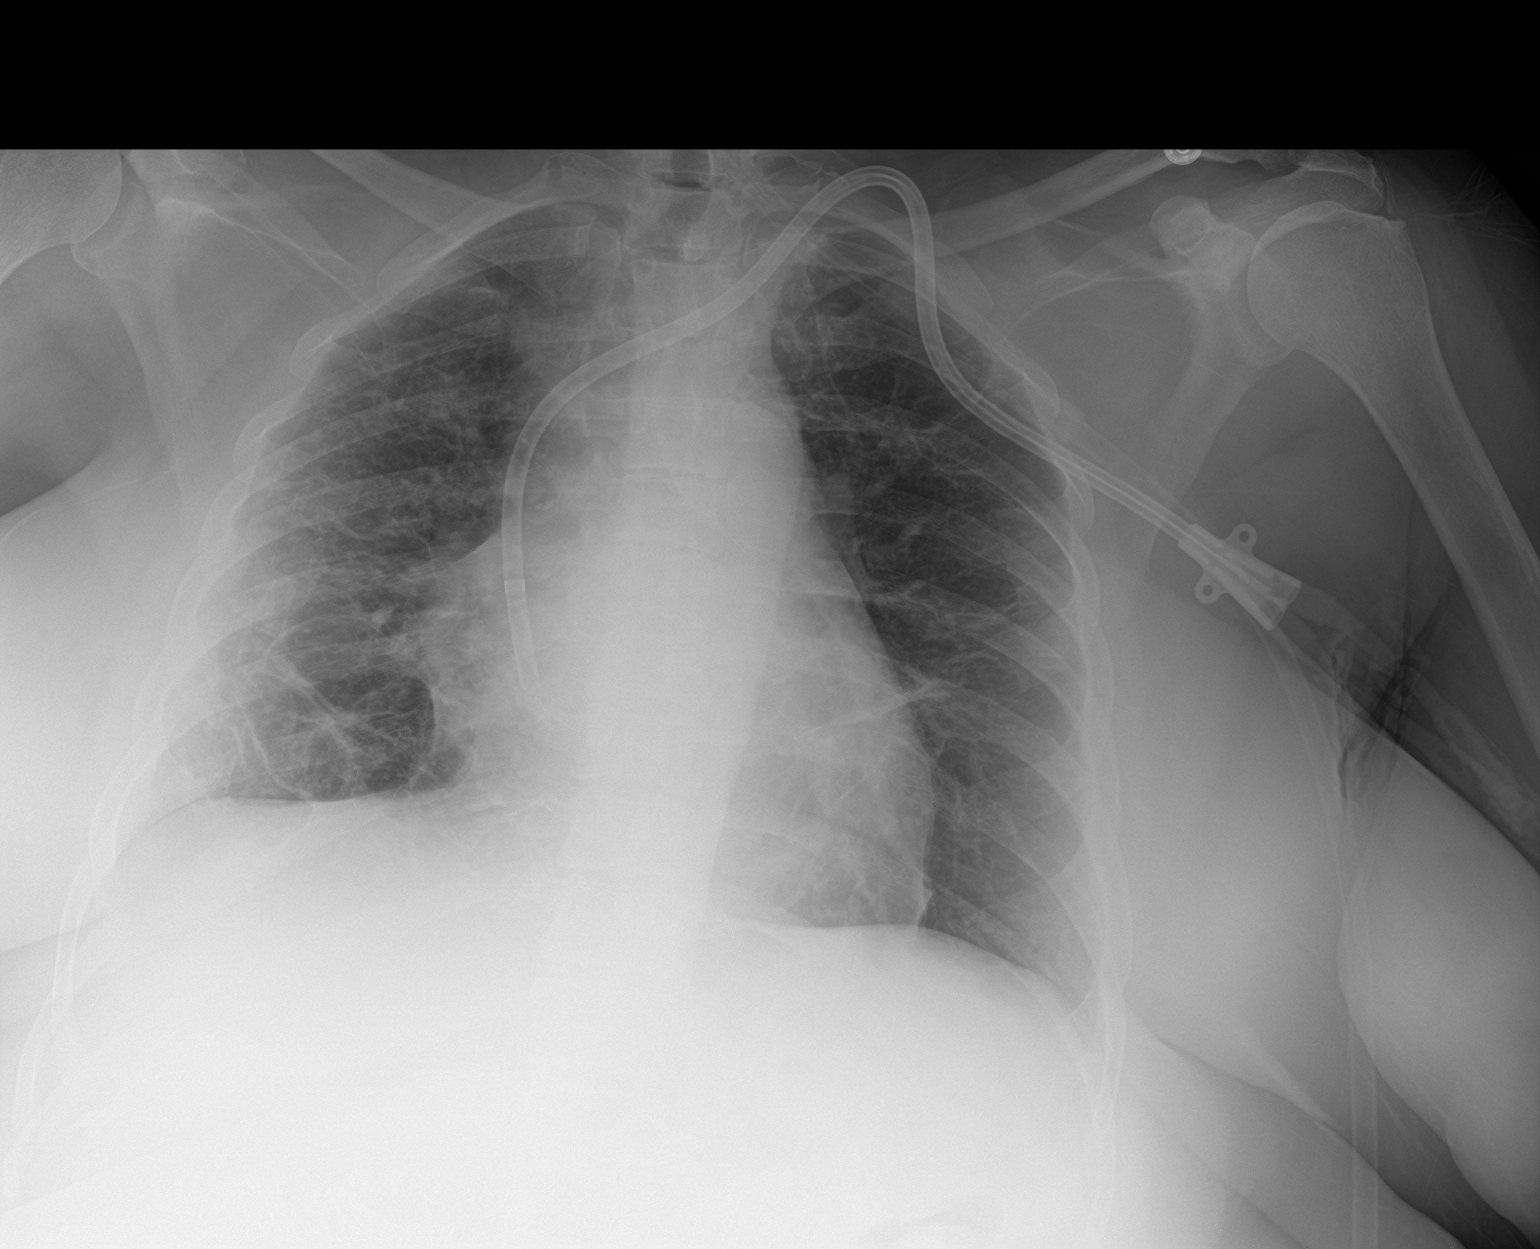

[2 of 2 positions shown; findings below may reference images not displayed]

FINDINGS: Previously noted tracheostomy and nasoenteric feeding tube have been
removed. Left internal jugular hemodialysis catheter tip is
unchanged within the right atrium.

Mild right-sided volume loss is noted. Previously noted extensive
asymmetric right-sided pulmonary infiltrate has significantly
improved with minimal residual infiltrate noted within the right
lung. Mild right basilar parenchymal scarring is noted. Left lung is
clear. No pneumothorax or pleural effusion. Cardiac size is within
normal limits. No acute bone abnormality.
IMPRESSION: Significantly improved right lung pulmonary infiltrate with minimal
residual infiltrate noted. Superimposed parenchymal scarring and
mild right-sided volume loss now present.

## 2021-01-17 ENCOUNTER — Other Ambulatory Visit: Payer: Self-pay

## 2021-01-17 ENCOUNTER — Other Ambulatory Visit (HOSPITAL_COMMUNITY): Payer: Self-pay | Admitting: Nurse Practitioner

## 2021-01-17 ENCOUNTER — Ambulatory Visit (HOSPITAL_COMMUNITY)
Admission: RE | Admit: 2021-01-17 | Discharge: 2021-01-17 | Disposition: A | Discharge status: Home / Self Care | Payer: Medicaid Other | Source: Ambulatory Visit | Attending: Nurse Practitioner | Admitting: Nurse Practitioner

## 2021-01-17 DIAGNOSIS — Z79890 Hormone replacement therapy (postmenopausal): Secondary | ICD-10-CM | POA: Insufficient documentation

## 2021-01-17 DIAGNOSIS — Z992 Dependence on renal dialysis: Secondary | ICD-10-CM | POA: Diagnosis not present

## 2021-01-17 DIAGNOSIS — I120 Hypertensive chronic kidney disease with stage 5 chronic kidney disease or end stage renal disease: Secondary | ICD-10-CM | POA: Insufficient documentation

## 2021-01-17 DIAGNOSIS — N186 End stage renal disease: Secondary | ICD-10-CM

## 2021-01-17 DIAGNOSIS — E039 Hypothyroidism, unspecified: Secondary | ICD-10-CM | POA: Insufficient documentation

## 2021-01-17 DIAGNOSIS — G4730 Sleep apnea, unspecified: Secondary | ICD-10-CM | POA: Diagnosis not present

## 2021-01-17 DIAGNOSIS — E785 Hyperlipidemia, unspecified: Secondary | ICD-10-CM | POA: Insufficient documentation

## 2021-01-17 DIAGNOSIS — E1122 Type 2 diabetes mellitus with diabetic chronic kidney disease: Secondary | ICD-10-CM | POA: Insufficient documentation

## 2021-01-17 DIAGNOSIS — Z885 Allergy status to narcotic agent status: Secondary | ICD-10-CM | POA: Diagnosis not present

## 2021-01-17 DIAGNOSIS — Z794 Long term (current) use of insulin: Secondary | ICD-10-CM | POA: Insufficient documentation

## 2021-01-17 DIAGNOSIS — E669 Obesity, unspecified: Secondary | ICD-10-CM | POA: Insufficient documentation

## 2021-01-17 DIAGNOSIS — T82510A Breakdown (mechanical) of surgically created arteriovenous fistula, initial encounter: Secondary | ICD-10-CM | POA: Diagnosis present

## 2021-01-17 DIAGNOSIS — Y841 Kidney dialysis as the cause of abnormal reaction of the patient, or of later complication, without mention of misadventure at the time of the procedure: Secondary | ICD-10-CM | POA: Diagnosis not present

## 2021-01-17 DIAGNOSIS — Z7982 Long term (current) use of aspirin: Secondary | ICD-10-CM | POA: Insufficient documentation

## 2021-01-17 DIAGNOSIS — Z79899 Other long term (current) drug therapy: Secondary | ICD-10-CM | POA: Insufficient documentation

## 2021-01-17 DIAGNOSIS — K219 Gastro-esophageal reflux disease without esophagitis: Secondary | ICD-10-CM | POA: Insufficient documentation

## 2021-01-17 DIAGNOSIS — Z6838 Body mass index (BMI) 38.0-38.9, adult: Secondary | ICD-10-CM | POA: Diagnosis not present

## 2021-01-17 HISTORY — PX: IR FLUORO GUIDE CV LINE LEFT: IMG2282

## 2021-01-17 LAB — CBC WITH DIFFERENTIAL/PLATELET
Abs Immature Granulocytes: 0.17 10*3/uL — ABNORMAL HIGH (ref 0.00–0.07)
Basophils Absolute: 0.1 10*3/uL (ref 0.0–0.1)
Basophils Relative: 1 %
Eosinophils Absolute: 0.3 10*3/uL (ref 0.0–0.5)
Eosinophils Relative: 3 %
HCT: 25.2 % — ABNORMAL LOW (ref 36.0–46.0)
Hemoglobin: 8.6 g/dL — ABNORMAL LOW (ref 12.0–15.0)
Immature Granulocytes: 1 %
Lymphocytes Relative: 21 %
Lymphs Abs: 2.5 10*3/uL (ref 0.7–4.0)
MCH: 30.4 pg (ref 26.0–34.0)
MCHC: 34.1 g/dL (ref 30.0–36.0)
MCV: 89 fL (ref 80.0–100.0)
Monocytes Absolute: 0.7 10*3/uL (ref 0.1–1.0)
Monocytes Relative: 6 %
Neutro Abs: 8.3 10*3/uL — ABNORMAL HIGH (ref 1.7–7.7)
Neutrophils Relative %: 68 %
Platelets: 278 10*3/uL (ref 150–400)
RBC: 2.83 MIL/uL — ABNORMAL LOW (ref 3.87–5.11)
RDW: 15.5 % (ref 11.5–15.5)
WBC: 12.1 10*3/uL — ABNORMAL HIGH (ref 4.0–10.5)
nRBC: 0 % (ref 0.0–0.2)

## 2021-01-17 LAB — PROTIME-INR
INR: 1 (ref 0.8–1.2)
Prothrombin Time: 13.2 seconds (ref 11.4–15.2)

## 2021-01-17 LAB — GLUCOSE, CAPILLARY: Glucose-Capillary: 168 mg/dL — ABNORMAL HIGH (ref 70–99)

## 2021-01-17 MED ORDER — CEFAZOLIN SODIUM-DEXTROSE 2-4 GM/100ML-% IV SOLN: 2.0000 g | INTRAVENOUS | Status: AC

## 2021-01-17 MED ORDER — CEFAZOLIN SODIUM-DEXTROSE 2-4 GM/100ML-% IV SOLN
INTRAVENOUS | Status: AC
  Administered 2021-01-17: 2 g via INTRAVENOUS
  Filled 2021-01-17: qty 100

## 2021-01-17 MED ORDER — SODIUM CHLORIDE 0.9 % IV SOLN: INTRAVENOUS | Status: DC

## 2021-01-17 MED ORDER — LIDOCAINE HCL 1 % IJ SOLN
INTRAMUSCULAR | Status: AC
  Filled 2021-01-17: qty 20

## 2021-01-17 MED ORDER — HEPARIN SODIUM (PORCINE) 1000 UNIT/ML IJ SOLN
INTRAMUSCULAR | Status: AC
  Filled 2021-01-17: qty 1

## 2021-01-17 NOTE — H&P (Signed)
Referring Physician(s): Valentina Gu  Supervising Physician: Corrie Mckusick  Patient Status:  [facility] OP  Chief Complaint: Poorly functioning dialysis catheter   Subjective: Patient familiar to IR service from left IJ tunneled hemodialysis catheter placement on 02/23/2020, catheter exchange on 02/27/2020, exchange on 05/30/2020, exchange on 10/10/2020 as well as on 10/12/2020.  Patient states she underwent another catheter exchange approximately 2 weeks ago with surgery due to poor functioning and continues to have issues with flow rates. She presents today for catheter exchange or new catheter placement. She is scheduled to undergo right arm dialysis graft placement by surgery on 8/24. Past medical history significant for anemia, arthritis, asthma, diabetes, end-stage renal disease, hypertension, GERD, glaucoma, hyperlipidemia, hypothyroidism, obesity and sleep apnea.  She currently denies fever, headache, chest pain, dyspnea, cough, abdominal/back pain, nausea, vomiting or bleeding.  She previously had left upper extremity access performed by Dr. Maryjean Morn in [facility] which failed.  Dr.Cain recently placed right upper extremity fistula and revised it but is now thrombosed.  Past Medical History:  Diagnosis Date   Anemia    Anxiety    Arthritis    Asthma    Chronic back pain    Diabetes mellitus ([place])    Type II   ESRD (end stage renal disease) on dialysis ([place])    TTS- [facility] since 2016   Essential hypertension    GERD (gastroesophageal reflux disease)    Glaucoma    History of blood transfusion    Hyperlipidemia    Hypothyroidism    Morbid obesity ([place])    Peripheral neuropathy    Sleep apnea    wears CPAP, does not know setting   Past Surgical History:  Procedure Laterality Date   AV FISTULA PLACEMENT Left 04/2014   AV FISTULA PLACEMENT Right 08/15/2020   Procedure: RIGHT BRACHIOCEPHALIC ARTERIOVENOUS (AV) FISTULA CREATION;  Surgeon: Waynetta Sandy, MD;  Location: [place];  Service: Vascular;  Laterality: Right;   CARDIAC CATHETERIZATION N/A 03/17/2016   Procedure: Left Heart Cath and Coronary Angiography;  Surgeon: [name], MD;  Location: [place] [facility];  Service: Cardiovascular;  Laterality: N/A;   INCISION AND DRAINAGE PERIRECTAL ABSCESS N/A 04/03/2020   Procedure: EXCISIONAL DEBRIDEMENT OF SACRAL AND GLUTEAL WOUNDS;  Surgeon: [name], MD;  Location: [place];  Service: General;  Laterality: N/A;   IR FLUORO GUIDE CV LINE LEFT  02/22/2020   IR FLUORO GUIDE CV LINE LEFT  02/27/2020   IR FLUORO GUIDE CV LINE LEFT  05/30/2020   IR FLUORO GUIDE CV LINE LEFT  10/10/2020   IR FLUORO GUIDE CV LINE LEFT  10/12/2020   IR US GUIDE VASC ACCESS LEFT  02/22/2020   KNEE SURGERY     LIGATION OF COMPETING BRANCHES OF ARTERIOVENOUS FISTULA Right 11/14/2020   Procedure: LIGATION OF COMPETING BRANCHES AND SUPERFICIALIZATION OF RIGHT ARTERIOVENOUS FISTULA;  Surgeon: Waynetta Sandy, MD;  Location: [place];  Service: Vascular;  Laterality: Right;   TUBAL LIGATION     UPPER EXTREMITY VENOGRAPHY Bilateral 01/14/2021   Procedure: UPPER EXTREMITY VENOGRAPHY;  Surgeon: Waynetta Sandy, MD;  Location: [place] [facility];  Service: Cardiovascular;  Laterality: Bilateral;      Allergies: Hydrocodone  Medications: Prior to Admission medications   Medication Sig Start Date End Date Taking? Authorizing Provider  acetaminophen (TYLENOL) 325 MG tablet Take 1-2 tablets (325-650 mg total) by mouth every 4 (four) hours as needed for mild pain. 05/29/20  Yes Love, Ivan Anchors,  PA-C  albuterol (VENTOLIN HFA) 108 (90 Base) MCG/ACT inhaler Inhale 1-2 puffs into the lungs every 6 (six) hours as needed for wheezing or shortness of breath.   Yes [provider]  aspirin EC 81 MG tablet Take 81 mg by mouth in the morning. Swallow whole.   Yes [provider]  AURYXIA 1 GM 210 MG(Fe) tablet Take 630 mg by mouth 3  (three) times daily with meals. 06/21/20  Yes [provider]  B Complex-C (B-COMPLEX WITH VITAMIN C) tablet Take 1 tablet by mouth daily. Patient taking differently: Take 1 tablet by mouth in the morning. 05/16/20  Yes Bloomfield, Carley D, DO  Brinzolamide-Brimonidine (SIMBRINZA) 1-0.2 % SUSP Place 1 drop into both eyes in the morning, at noon, and at bedtime.   Yes [provider]  cinacalcet (SENSIPAR) 30 MG tablet Take 30 mg by mouth daily.   Yes [provider]  Dulaglutide (TRULICITY) A999333 [id number] SOPN Inject 0.75 mg into the skin every Sunday. 08/28/20  Yes [provider]  fluticasone furoate-vilanterol (BREO ELLIPTA) 100-25 MCG/INH AEPB Inhale 1 puff into the lungs in the morning.   Yes [provider]  gabapentin (NEURONTIN) 100 MG capsule Take 100 mg by mouth 2 (two) times daily.   Yes [provider]  insulin glargine (LANTUS) 100 unit/mL SOPN Inject 4 Units into the skin at bedtime. Patient taking differently: Inject 8 Units into the skin at bedtime. 05/30/20  Yes Love, Ivan Anchors, PA-C  levothyroxine (SYNTHROID) 125 MCG tablet Take 1 tablet (125 mcg total) by mouth daily before breakfast. 05/29/20  Yes Love, Ivan Anchors, PA-C  montelukast (SINGULAIR) 10 MG tablet Take 10 mg by mouth in the morning.   Yes [provider]  Netarsudil Dimesylate (RHOPRESSA) 0.02 % SOLN Place 1 drop into both eyes at bedtime.   Yes [provider]  oxyCODONE-acetaminophen (PERCOCET) 5-325 MG tablet Take 1 tablet by mouth every 6 (six) hours as needed for severe pain. 11/14/20 11/14/21 Yes Dagoberto Ligas, PA-C  OXYGEN Inhale 4 L into the lungs continuous.   Yes [provider]  pantoprazole (PROTONIX) 40 MG tablet Take 1 tablet (40 mg total) by mouth daily. Patient taking differently: Take 40 mg by mouth in the morning. 05/29/20  Yes Love, Ivan Anchors, PA-C  QUEtiapine (SEROQUEL) 50 MG tablet Take 1 tablet (50 mg total) by mouth at  bedtime. 05/29/20  Yes Love, Ivan Anchors, PA-C  insulin aspart (NOVOLOG FLEXPEN) 100 UNIT/ML FlexPen Inject 4 Units into the skin 3 (three) times daily with meals.    [provider]  lip balm (CARMEX) ointment Apply topically as needed for lip care. Patient taking differently: Apply 1 application topically as needed for lip care. 05/15/20   Bloomfield, Carley D, DO  midodrine (PROAMATINE) 10 MG tablet Take 1 tablet (10 mg total) by mouth 3 (three) times daily with meals. 05/29/20   Love, Ivan Anchors, PA-C  Oxycodone HCl 10 MG TABS Take 10 mg by mouth 4 (four) times daily as needed (pain). 07/26/20   [provider]     Vital Signs: BP (!) 147/91 (BP Location: Left Arm)   Pulse 81   Temp 98.2 F (36.8 C) (Oral)   Resp 18   Ht '5\' 2"'$  (1.575 m)   Wt 212 lb (96.2 kg)   SpO2 99%   BMI 38.78 kg/m   Physical Exam awake, alert.  Chest clear to auscultation bilaterally.  Heart with regular rate and rhythm.  Abdomen obese, soft,  positive bowel sounds, nontender.  No significant lower extremity edema.  Intact left chest wall HD catheter, insertion site mildly tender.  Imaging: PERIPHERAL VASCULAR CATHETERIZATION  Result Date: 01/14/2021 Images from the original result were not included. Patient name: Wanda Ramirez MRN: [id number] DOB: Feb 16, 1971 Sex: female 01/14/2021 Pre-operative Diagnosis: End-stage renal disease Post-operative diagnosis:  Same Surgeon:  [name] C. Donzetta Matters, MD Procedure Performed: Bilateral upper extremity venography Indications: 50 year old female with failed bilateral upper extremity arteriovenous access.  She is not a good candidate for lower extremity access.  She is now indicated for bilateral upper extremity venography. Findings: Bilateral upper extremity veins were small.  The right side axillary and subclavian veins emptied centrally much more brisk than the left side. We will plan for right upper extremity AV graft on a nondialysis day in the near future.   Procedure:  The patient was identified in the holding area and taken to room 8.  The patient was then placed supine on the table and timeout was called.  The previous lead placed intravenous accesses were accessed and bilateral per extremity venography was performed with the above findings. Contrast: 40cc Brandon C. Donzetta Matters, MD [facility] Office: [phone] Pager: [phone]    Labs:  CBC: Recent Labs    05/29/20 1334 08/15/20 0950 10/11/20 1516 10/12/20 0225 10/13/20 0357 11/14/20 1157 01/14/21 1022  WBC 10.2  --  9.9 12.0* 10.4  --   --   HGB 9.0*   < > 10.4* 10.2* 9.5* 12.6 8.5*  HCT 27.2*   < > 31.0* 30.5* 28.4* 37.0 25.0*  PLT 380  --  311 289 258  --   --    < > = values in this interval not displayed.    COAGS: Recent Labs    03/03/20 1700 03/04/20 0356 03/04/20 1605 03/05/20 0450 04/02/20 1825 04/03/20 0245 04/03/20 1023 04/09/20 1025 04/16/20 1730  INR  --   --   --   --    < > 1.4* 1.5* 1.2 1.0  APTT 73* 52* 62* 47*  --   --   --   --   --    < > = values in this interval not displayed.    BMP: Recent Labs    03/04/20 0356 03/04/20 0927 03/04/20 1605 03/05/20 0450 03/05/20 1703 03/06/20 1713 05/29/20 1335 08/15/20 0950 10/11/20 1516 10/12/20 0225 10/13/20 0357 11/14/20 1157 01/14/21 1022  NA 136   < > 136 135 134*   < > 133*   < > 135 138 136 136 135  K 3.8   < > 3.6 4.0 4.6   < > 4.4   < > 4.8 5.1 5.9* 4.2 5.1  CL 101  --  103 102 102   < > 97*   < > 98 99 100 100 100  CO2 23  --  '25 24 22   '$ < > 21*  --  21* 21* 19*  --   --   GLUCOSE 160*  --  223* 203* 258*   < > 180*   < > 283* 137* 132* 242* 195*  BUN 29*  --  28* 27* 31*   < > 47*   < > 68* 73* 89* 40* 67*  CALCIUM 8.6*  --  8.5* 8.9 8.6*   < > 10.5*  --  9.1 9.2 8.2*  --   --   CREATININE 1.90*  --  2.00* 1.82* 2.00*   < > 7.32*   < >  13.81* 14.68* 16.90* 8.80* 13.30*  GFRNONAA 30*  --  29* 32* 29*   < > 6*  --  3* 3* 2*  --   --   GFRAA 35*  --  33* 37*  33*  --   --   --   --   --   --   --   --    < > = values in this interval not displayed.    LIVER FUNCTION TESTS: Recent Labs    04/04/20 0423 04/09/20 0917 04/11/20 1130 04/12/20 0520 04/13/20 0500 04/14/20 0719 05/24/20 1332 05/27/20 1640 05/29/20 1335 10/13/20 0357  BILITOT 4.0*  --  1.1 0.9 0.8  --   --   --   --   --   AST 62*  --  '26 31 25  '$ --   --   --   --   --   ALT 20  --  '25 27 25  '$ --   --   --   --   --   ALKPHOS 202*  --  390* 403* 316*  --   --   --   --   --   PROT 5.8*  --  6.2* 6.6 5.9*  --   --   --   --   --   ALBUMIN 1.4*   < > 1.4* 1.6* 1.5*   < > 2.5* 2.5* 2.7* 2.8*   < > = values in this interval not displayed.    Assessment and Plan: Patient familiar to IR service from left IJ tunneled hemodialysis catheter placement on 02/23/2020, catheter exchange on 02/27/2020, exchange on 05/30/2020, exchange on 10/10/2020 as well as on 10/12/2020.  Patient states she underwent another catheter exchange approximately 2 weeks ago with surgery due to poor functioning and continues to have issues with flow rates. She presents today for catheter exchange or new catheter placement. She is scheduled to undergo right arm dialysis graft placement by surgery on 8/24. Past medical history significant for anemia, arthritis, asthma, diabetes, end-stage renal disease, hypertension, GERD, glaucoma, hyperlipidemia, hypothyroidism, obesity and sleep apnea.  She currently denies fever, headache, chest pain, dyspnea, cough, abdominal/back pain, nausea, vomiting or bleeding.  She previously had left upper extremity access performed by Dr. Maryjean Morn in [facility] which failed.  Dr.Cain recently placed right upper extremity fistula and revised it but is now thrombosed.  Details/risks of above procedure, including but not limited to, internal bleeding, infection, injury to adjacent structures discussed with patient with her understanding and consent.   Electronically Signed: D. Rowe Robert,  PA-C 01/17/2021, 2:02 PM   I spent a total of 20 minutes at the the patient's bedside AND on the patient's hospital floor or unit, greater than 50% of which was counseling/coordinating care for hemodialysis catheter exchange and/or new catheter placement

## 2021-01-17 NOTE — Procedures (Signed)
Interventional Radiology Procedure Note  Procedure: Replacement of a left IJ approach nonfunctional HD catheter. Catheter was withdrawn into the azygous, and was not aspirating.    New 28cm tip to cuff HD catheter, positioned at the superior cavoatrial junction and catheter is ready for immediate use.   Complications: None Recommendations:  - Ok to use - Do not submerge - Routine line care   Signed,  Dulcy Fanny. Earleen Newport, DO

## 2021-01-22 ENCOUNTER — Encounter (HOSPITAL_COMMUNITY): Payer: Self-pay | Admitting: Vascular Surgery

## 2021-01-22 NOTE — Progress Notes (Signed)
DUE TO COVID-19 ONLY ONE VISITOR IS ALLOWED TO COME WITH YOU AND STAY IN THE WAITING ROOM ONLY DURING PRE OP AND PROCEDURE DAY OF SURGERY.   Cardiologist - Dr Abran Richard Endocrinology - Dr Peri Jefferson Internal Med - Dr Jannifer Hick  Chest x-ray - 10/11/20 (2V) EKG - 03/02/20 Stress Test - 07/14/18 pharmacologic stress test   ECHO Limited - 02/29/20 Cardiac Cath - 03/17/16  Sleep Study -  Yes CPAP - wears CPAP nightly  Fasting Blood Sugar - 140s Checks Blood Sugar EOD times a day  Do not take trulicity on DOS, (weekly on Sunday)  THE NIGHT BEFORE SURGERY, take 4 Units Lantus Insulin.        If your CBG is greater than 220 mg/dL, you may take  of your sliding scale (correction) dose of Novolog Insulin.  If your blood sugar is less than 70 mg/dL, you will need to treat for low blood sugar: Treat a low blood sugar (less than 70 mg/dL) with  cup of clear juice (cranberry or apple), 4 glucose tablets, OR glucose gel. Recheck blood sugar in 15 minutes after treatment (to make sure it is greater than 70 mg/dL). If your blood sugar is not greater than 70 mg/dL on recheck, call [phone] for further instructions.  Aspirin Instructions: Follow your surgeon's instructions on when to stop aspirin prior to surgery,  If no instructions were given by your surgeon then you will need to call the office for those instructions.  Anesthesia review: Yes  STOP now taking any Aspirin (unless otherwise instructed by your surgeon), Aleve, Naproxen, Ibuprofen, Motrin, Advil, Goody's, BC's, all herbal medications, fish oil, and all vitamins.   Coronavirus Screening Covid test n/a - Ambulatory Surgery Do you have any of the following symptoms:  Cough yes/no: No Fever (>100.40F)  yes/no: No Runny nose yes/no: No Sore throat yes/no: No Difficulty breathing/shortness of breath  yes/no: No  Have you traveled in the last 14 days and where? yes/no: No  Patient verbalized understanding of instructions  that were given via phone.

## 2021-01-23 ENCOUNTER — Ambulatory Visit (HOSPITAL_COMMUNITY): Payer: Medicaid Other | Admitting: Physician Assistant

## 2021-01-23 ENCOUNTER — Other Ambulatory Visit: Payer: Self-pay

## 2021-01-23 ENCOUNTER — Encounter (HOSPITAL_COMMUNITY)
Admission: RE | Disposition: A | Discharge status: Home / Self Care | Payer: Self-pay | Source: Home / Self Care | Attending: Vascular Surgery

## 2021-01-23 ENCOUNTER — Ambulatory Visit (HOSPITAL_COMMUNITY)
Admission: RE | Admit: 2021-01-23 | Discharge: 2021-01-23 | Disposition: A | Discharge status: Home / Self Care | Payer: Medicaid Other | Attending: Vascular Surgery | Admitting: Vascular Surgery

## 2021-01-23 DIAGNOSIS — N186 End stage renal disease: Secondary | ICD-10-CM | POA: Insufficient documentation

## 2021-01-23 DIAGNOSIS — Z7951 Long term (current) use of inhaled steroids: Secondary | ICD-10-CM | POA: Diagnosis not present

## 2021-01-23 DIAGNOSIS — Z794 Long term (current) use of insulin: Secondary | ICD-10-CM | POA: Diagnosis not present

## 2021-01-23 DIAGNOSIS — E1122 Type 2 diabetes mellitus with diabetic chronic kidney disease: Secondary | ICD-10-CM | POA: Insufficient documentation

## 2021-01-23 DIAGNOSIS — I120 Hypertensive chronic kidney disease with stage 5 chronic kidney disease or end stage renal disease: Secondary | ICD-10-CM | POA: Insufficient documentation

## 2021-01-23 DIAGNOSIS — Z7982 Long term (current) use of aspirin: Secondary | ICD-10-CM | POA: Diagnosis not present

## 2021-01-23 DIAGNOSIS — E1142 Type 2 diabetes mellitus with diabetic polyneuropathy: Secondary | ICD-10-CM | POA: Diagnosis not present

## 2021-01-23 DIAGNOSIS — Z79890 Hormone replacement therapy (postmenopausal): Secondary | ICD-10-CM | POA: Diagnosis not present

## 2021-01-23 DIAGNOSIS — Z9981 Dependence on supplemental oxygen: Secondary | ICD-10-CM | POA: Insufficient documentation

## 2021-01-23 DIAGNOSIS — Y832 Surgical operation with anastomosis, bypass or graft as the cause of abnormal reaction of the patient, or of later complication, without mention of misadventure at the time of the procedure: Secondary | ICD-10-CM | POA: Insufficient documentation

## 2021-01-23 DIAGNOSIS — Z79899 Other long term (current) drug therapy: Secondary | ICD-10-CM | POA: Insufficient documentation

## 2021-01-23 DIAGNOSIS — T82868A Thrombosis of vascular prosthetic devices, implants and grafts, initial encounter: Secondary | ICD-10-CM | POA: Insufficient documentation

## 2021-01-23 DIAGNOSIS — Z888 Allergy status to other drugs, medicaments and biological substances status: Secondary | ICD-10-CM | POA: Diagnosis not present

## 2021-01-23 DIAGNOSIS — Z87891 Personal history of nicotine dependence: Secondary | ICD-10-CM | POA: Insufficient documentation

## 2021-01-23 DIAGNOSIS — Z992 Dependence on renal dialysis: Secondary | ICD-10-CM | POA: Insufficient documentation

## 2021-01-23 HISTORY — DX: Dependence on supplemental oxygen: Z99.81

## 2021-01-23 HISTORY — DX: Acute myocardial infarction, unspecified: I21.9

## 2021-01-23 HISTORY — PX: AV FISTULA PLACEMENT: SHX1204

## 2021-01-23 HISTORY — DX: Dyspnea, unspecified: R06.00

## 2021-01-23 LAB — GLUCOSE, CAPILLARY
Glucose-Capillary: 228 mg/dL — ABNORMAL HIGH (ref 70–99)
Glucose-Capillary: 145 mg/dL — ABNORMAL HIGH (ref 70–99)
Glucose-Capillary: 99 mg/dL (ref 70–99)
Glucose-Capillary: 111 mg/dL — ABNORMAL HIGH (ref 70–99)

## 2021-01-23 LAB — POCT I-STAT, CHEM 8
BUN: 32 mg/dL — ABNORMAL HIGH (ref 6–20)
Calcium, Ion: 1.09 mmol/L — ABNORMAL LOW (ref 1.15–1.40)
Chloride: 97 mmol/L — ABNORMAL LOW (ref 98–111)
Creatinine, Ser: 7.3 mg/dL — ABNORMAL HIGH (ref 0.44–1.00)
Glucose, Bld: 229 mg/dL — ABNORMAL HIGH (ref 70–99)
HCT: 31 % — ABNORMAL LOW (ref 36.0–46.0)
Hemoglobin: 10.5 g/dL — ABNORMAL LOW (ref 12.0–15.0)
Potassium: 5.5 mmol/L — ABNORMAL HIGH (ref 3.5–5.1)
Sodium: 136 mmol/L (ref 135–145)
TCO2: 28 mmol/L (ref 22–32)

## 2021-01-23 SURGERY — INSERTION OF ARTERIOVENOUS (AV) GORE-TEX GRAFT ARM
Anesthesia: Monitor Anesthesia Care | Laterality: Right

## 2021-01-23 MED ORDER — CHLORHEXIDINE GLUCONATE 4 % EX LIQD: 60.0000 mL | Freq: Once | CUTANEOUS | Status: DC

## 2021-01-23 MED ORDER — PROPOFOL 500 MG/50ML IV EMUL
INTRAVENOUS | Status: DC | PRN
  Administered 2021-01-23: 75 ug/kg/min via INTRAVENOUS

## 2021-01-23 MED ORDER — MIDAZOLAM HCL 2 MG/2ML IJ SOLN
2.0000 mg | Freq: Once | INTRAMUSCULAR | Status: AC
  Administered 2021-01-23: 2 mg via INTRAVENOUS
  Filled 2021-01-23: qty 2

## 2021-01-23 MED ORDER — FENTANYL CITRATE (PF) 100 MCG/2ML IJ SOLN
50.0000 ug | Freq: Once | INTRAMUSCULAR | Status: AC
  Filled 2021-01-23: qty 1

## 2021-01-23 MED ORDER — POVIDONE-IODINE 10 % EX SOLN
CUTANEOUS | Status: DC | PRN
  Administered 2021-01-23: 1 via TOPICAL

## 2021-01-23 MED ORDER — HEMOSTATIC AGENTS (NO CHARGE) OPTIME
TOPICAL | Status: DC | PRN
  Administered 2021-01-23: 1 via TOPICAL

## 2021-01-23 MED ORDER — PROPOFOL 10 MG/ML IV BOLUS
INTRAVENOUS | Status: DC | PRN
  Administered 2021-01-23: 20 mg via INTRAVENOUS

## 2021-01-23 MED ORDER — 0.9 % SODIUM CHLORIDE (POUR BTL) OPTIME
TOPICAL | Status: DC | PRN
  Administered 2021-01-23: 1000 mL

## 2021-01-23 MED ORDER — HEPARIN 6000 UNIT IRRIGATION SOLUTION
Status: DC | PRN
  Administered 2021-01-23: 1

## 2021-01-23 MED ORDER — FENTANYL CITRATE (PF) 100 MCG/2ML IJ SOLN
INTRAMUSCULAR | Status: AC
  Administered 2021-01-23: 50 ug via INTRAVENOUS
  Filled 2021-01-23: qty 2

## 2021-01-23 MED ORDER — CHLORHEXIDINE GLUCONATE 0.12 % MT SOLN
15.0000 mL | Freq: Once | OROMUCOSAL | Status: AC
  Administered 2021-01-23: 15 mL via OROMUCOSAL
  Filled 2021-01-23: qty 15

## 2021-01-23 MED ORDER — FENTANYL CITRATE (PF) 250 MCG/5ML IJ SOLN
INTRAMUSCULAR | Status: DC | PRN
  Administered 2021-01-23 (×2): 25 ug via INTRAVENOUS

## 2021-01-23 MED ORDER — STERILE WATER FOR IRRIGATION IR SOLN
Status: DC | PRN
  Administered 2021-01-23: 1000 mL

## 2021-01-23 MED ORDER — POVIDONE-IODINE 7.5 % EX SOLN
CUTANEOUS | Status: DC | PRN
  Administered 2021-01-23: 1 via TOPICAL

## 2021-01-23 MED ORDER — HEPARIN 6000 UNIT IRRIGATION SOLUTION
Status: AC
  Filled 2021-01-23: qty 500

## 2021-01-23 MED ORDER — CEFAZOLIN SODIUM-DEXTROSE 2-3 GM-%(50ML) IV SOLR
INTRAVENOUS | Status: DC | PRN
  Administered 2021-01-23: 2 g via INTRAVENOUS

## 2021-01-23 MED ORDER — ORAL CARE MOUTH RINSE: 15.0000 mL | Freq: Once | OROMUCOSAL | Status: AC

## 2021-01-23 MED ORDER — CLONIDINE HCL (ANALGESIA) 100 MCG/ML EP SOLN
EPIDURAL | Status: DC | PRN
  Administered 2021-01-23: 100 ug

## 2021-01-23 MED ORDER — ROPIVACAINE HCL 5 MG/ML IJ SOLN
INTRAMUSCULAR | Status: DC | PRN
  Administered 2021-01-23: 150 mg via PERINEURAL

## 2021-01-23 MED ORDER — INSULIN ASPART 100 UNIT/ML IJ SOLN
6.0000 [IU] | Freq: Once | INTRAMUSCULAR | Status: AC
  Administered 2021-01-23: 6 [IU] via SUBCUTANEOUS
  Filled 2021-01-23 (×2): qty 0.06
  Filled 2021-01-23: qty 1

## 2021-01-23 MED ORDER — FENTANYL CITRATE (PF) 250 MCG/5ML IJ SOLN
INTRAMUSCULAR | Status: AC
  Filled 2021-01-23: qty 5

## 2021-01-23 MED ORDER — LIDOCAINE-EPINEPHRINE (PF) 1 %-1:200000 IJ SOLN
INTRAMUSCULAR | Status: AC
  Filled 2021-01-23: qty 30

## 2021-01-23 MED ORDER — PHENYLEPHRINE HCL-NACL 20-0.9 MG/250ML-% IV SOLN
INTRAVENOUS | Status: DC | PRN
  Administered 2021-01-23: 20 ug/min via INTRAVENOUS

## 2021-01-23 MED ORDER — MIDAZOLAM HCL 2 MG/2ML IJ SOLN
INTRAMUSCULAR | Status: AC
  Filled 2021-01-23: qty 2

## 2021-01-23 MED ORDER — LIDOCAINE-EPINEPHRINE (PF) 1 %-1:200000 IJ SOLN
INTRAMUSCULAR | Status: DC | PRN
  Administered 2021-01-23: 4 mL

## 2021-01-23 MED ORDER — SODIUM CHLORIDE 0.9 % IV SOLN: INTRAVENOUS | Status: DC

## 2021-01-23 MED ORDER — HEPARIN SODIUM (PORCINE) 1000 UNIT/ML IJ SOLN
INTRAMUSCULAR | Status: DC | PRN
  Administered 2021-01-23: 3000 [IU] via INTRAVENOUS

## 2021-01-23 SURGICAL SUPPLY — 32 items
ARMBAND PINK RESTRICT EXTREMIT (MISCELLANEOUS) ×3 IMPLANT
BAG COUNTER SPONGE SURGICOUNT (BAG) ×2 IMPLANT
BAG SURGICOUNT SPONGE COUNTING (BAG) ×1
CANISTER SUCT 3000ML PPV (MISCELLANEOUS) ×3 IMPLANT
CLIP VESOCCLUDE MED 6/CT (CLIP) ×3 IMPLANT
CLIP VESOCCLUDE SM WIDE 6/CT (CLIP) ×3 IMPLANT
COVER PROBE W GEL 5X96 (DRAPES) ×3 IMPLANT
DERMABOND ADVANCED (GAUZE/BANDAGES/DRESSINGS) ×2
DERMABOND ADVANCED .7 DNX12 (GAUZE/BANDAGES/DRESSINGS) ×1 IMPLANT
ELECT REM PT RETURN 9FT ADLT (ELECTROSURGICAL) ×3
ELECTRODE REM PT RTRN 9FT ADLT (ELECTROSURGICAL) ×1 IMPLANT
GLOVE SURG ENC MOIS LTX SZ7.5 (GLOVE) ×3 IMPLANT
GLOVE SURG UNDER POLY LF SZ6 (GLOVE) ×9 IMPLANT
GLOVE SURG UNDER POLY LF SZ6.5 (GLOVE) ×3 IMPLANT
GOWN STRL REUS W/ TWL LRG LVL3 (GOWN DISPOSABLE) ×4 IMPLANT
GOWN STRL REUS W/ TWL XL LVL3 (GOWN DISPOSABLE) ×1 IMPLANT
GOWN STRL REUS W/TWL LRG LVL3 (GOWN DISPOSABLE) ×8
GOWN STRL REUS W/TWL XL LVL3 (GOWN DISPOSABLE) ×2
GRAFT GORETEX STRT 4-7X45 (Vascular Products) ×3 IMPLANT
KIT BASIN OR (CUSTOM PROCEDURE TRAY) ×3 IMPLANT
KIT TURNOVER KIT B (KITS) ×3 IMPLANT
NS IRRIG 1000ML POUR BTL (IV SOLUTION) ×3 IMPLANT
PACK CV ACCESS (CUSTOM PROCEDURE TRAY) ×3 IMPLANT
PAD ARMBOARD 7.5X6 YLW CONV (MISCELLANEOUS) ×6 IMPLANT
SURGIFLO W/THROMBIN 8M KIT (HEMOSTASIS) ×3 IMPLANT
SUT PROLENE 6 0 BV (SUTURE) ×6 IMPLANT
SUT VIC AB 3-0 SH 27 (SUTURE) ×2
SUT VIC AB 3-0 SH 27X BRD (SUTURE) ×1 IMPLANT
SUT VICRYL 3-0 27 CRC X-1 (SUTURE) ×6 IMPLANT
TOWEL GREEN STERILE (TOWEL DISPOSABLE) ×3 IMPLANT
UNDERPAD 30X36 HEAVY ABSORB (UNDERPADS AND DIAPERS) ×3 IMPLANT
WATER STERILE IRR 1000ML POUR (IV SOLUTION) ×3 IMPLANT

## 2021-01-23 NOTE — Anesthesia Postprocedure Evaluation (Signed)
Anesthesia Post Note  Patient: ANAHITA VANZANTEN  Procedure(s) Performed: INSERTION OF RIGHT UPPER ARM ARTERIOVENOUS (AV) GORE-TEX GRAFT, 4-7 mm x 45 cm STRETCH VASCULAR GRAFT (Right)     Patient location during evaluation: PACU Anesthesia Type: Regional Level of consciousness: awake and alert Pain management: pain level controlled Vital Signs Assessment: post-procedure vital signs reviewed and stable Respiratory status: spontaneous breathing, nonlabored ventilation, respiratory function stable and patient connected to nasal cannula oxygen Cardiovascular status: stable and blood pressure returned to baseline Postop Assessment: no apparent nausea or vomiting Anesthetic complications: no   No notable events documented.  Last Vitals:  Vitals:   01/23/21 1718 01/23/21 1733  BP: 100/77 110/87  Pulse: 70 77  Resp: 13 18  Temp: 36.4 C   SpO2: 100% 98%    Last Pain:  Vitals:   01/23/21 1718  TempSrc:   PainSc: 0-No pain                 Barnet Glasgow

## 2021-01-23 NOTE — Anesthesia Preprocedure Evaluation (Signed)
Anesthesia Evaluation  Patient identified by MRN, date of birth, ID band Patient awake    Reviewed: Allergy & Precautions, NPO status , Patient's Chart, lab work & pertinent test results  Airway Mallampati: III  TM Distance: >3 FB Neck ROM: Full    Dental no notable dental hx. (+) Teeth Intact, Dental Advisory Given   Pulmonary shortness of breath, asthma , sleep apnea and Continuous Positive Airway Pressure Ventilation , former smoker,  oN 4L [place] oxygen   Pulmonary exam normal breath sounds clear to auscultation       Cardiovascular hypertension, Pt. on medications + Past MI  Normal cardiovascular exam Rhythm:Regular Rate:Normal  02/29/20 Echo   1. Left ventricular ejection fraction, by estimation, is 60 to 65%. The  left ventricle has normal function. There is the interventricular septum  is flattened in systole and diastole, consistent with right ventricular  pressure and volume overload.  2. Right ventricular systolic function is moderately reduced. The right  ventricular size is severely enlarged. There is normal pulmonary artery  systolic pressure. The estimated right ventricular systolic pressure is  40.1 mmHg.  3. Trivial mitral valve regurgitation.    Neuro/Psych Anxiety    GI/Hepatic GERD  ,  Endo/Other  diabetes, Type 1Hypothyroidism   Renal/GU ESRF and DialysisRenal diseaseMWF Dialysis     Musculoskeletal  (+) Arthritis ,   Abdominal   Peds  Hematology Lab Results      Component                Value               Date                      WBC                      12.1 (H)            01/17/2021                HGB                      10.5 (L)            01/23/2021                HCT                      31.0 (L)            01/23/2021                MCV                      89.0                01/17/2021                PLT                      278                 01/17/2021              Anesthesia  Other Findings All Hydrocodone  Reproductive/Obstetrics                             Anesthesia Physical Anesthesia Plan  ASA: 4  Anesthesia Plan: Regional and MAC   Post-op Pain Management:    Induction: Intravenous  PONV Risk Score and Plan: Treatment may vary due to age or medical condition and Ondansetron  Airway Management Planned: Nasal Cannula and Natural Airway  Additional Equipment: None  Intra-op Plan:   Post-operative Plan:   Informed Consent: I have reviewed the patients History and Physical, chart, labs and discussed the procedure including the risks, benefits and alternatives for the proposed anesthesia with the patient or authorized representative who has indicated his/her understanding and acceptance.     Dental advisory given  Plan Discussed with: CRNA and Anesthesiologist  Anesthesia Plan Comments: (Mac plus L supraclavicur block)        Anesthesia Quick Evaluation

## 2021-01-23 NOTE — Op Note (Signed)
    Patient name: [name] MRN: [id number] DOB: 08/19/1970 Sex: female  01/23/2021 Pre-operative Diagnosis: End-stage renal disease Post-operative diagnosis:  Same Surgeon:  [name] Assistants:  Risa Grill, PA, Leontine Locket, [place] Procedure:   Right upper arm dialysis graft (4 x 7 Gore-Tex) Anesthesia: Regional Blood Loss: Minimal Specimens: None  Findings: Small 3 mm brachial artery.  The venous anastomosis was end to end  Indications: This is a 50 year old female who has a failed right brachiocephalic fistula.  She is undergone venogram which shows a patent central venous system.  She comes in today for a right arm graft.  Procedure:  The patient was identified in the holding area and taken to [place] 12  The patient was then placed supine on the table. regional anesthesia was administered.  The patient was prepped and draped in the usual sterile fashion.  A time out was called and antibiotics were administered.  A PA was necessary to expedite procedure and assist with technical details.  A longitudinal incision was made just proximal to the antecubital crease.  Through this incision I dissected out the brachial artery which was a small 3 mm disease-free artery.  It was encircled proximally distally with Vesseloops.  Next a longitudinal incision was made up near the axilla.  Through this incision, the brachial vein was dissected free.  This was a 5 mm vein.  Next a horseshoe tunneler was used to create a tunnel between the 2 incisions.  A 4 x 7 stretch Gore-Tex graft was brought through the tunnel.  The patient was given 3000 units of heparin.  The brachial artery was then occluded with vascular clamps and a #11 blade was used to make an arteriotomy which was extended longitudinally with Potts scissors.  The 4 mm end of the graft was cut to fit the size the arteriotomy and a running anastomosis was created in a end-to-side fashion with 6-0 Prolene.  Prior to completion the  appropriate flushing maneuvers were performed and the anastomosis was completed.  There was excellent pulsatile flow through the graft.  The graft was then flushed with heparin saline and occluded.  I then marked the brachial vein for orientation.  Baby Gregory clamp was placed proximally and the vein was ligated distally.  The Gore-Tex graft was cut the appropriate length and the end to end anastomosis was created with running 6-0 Prolene.  Prior to completion the appropriate flushing maneuvers were performed and the anastomosis was completed.  Patient had brisk Doppler flow in her brachial vein and a palpable pulse within the graft.  There were biphasic radial and ulnar Doppler signals.  The wound was then copiously irrigated.  Both incisions were closed by reapproximating the deep tissue with 3-0 Vicryl and the skin with subcuticular closure.  Dermabond was applied.  There were no immediate complications.  Patient was taken to recovery in stable condition   Disposition:  To PACU stable   V. [name], M.D., [facility] [place] Office: [phone] Pager:  [phone]

## 2021-01-23 NOTE — Discharge Instructions (Signed)
   [facility]  Discharge Instructions  AV Fistula or Graft Surgery for Dialysis Access  Please refer to the following instructions for your post-procedure care. Your surgeon or physician assistant will discuss any changes with you.  Activity  You may drive the day following your surgery, if you are comfortable and no longer taking prescription pain medication. Resume full activity as the soreness in your incision resolves.  Bathing/Showering  You may shower after you go home. Keep your incision dry for 48 hours. Do not soak in a bathtub, hot tub, or swim until the incision heals completely. You may not shower if you have a hemodialysis catheter.  Incision Care  Clean your incision with mild soap and water after 48 hours. Pat the area dry with a clean towel. You do not need a bandage unless otherwise instructed. Do not apply any ointments or creams to your incision. You may have skin glue on your incision. Do not peel it off. It will come off on its own in about one week. Your arm may swell a bit after surgery. To reduce swelling use pillows to elevate your arm so it is above your heart. Your doctor will tell you if you need to lightly wrap your arm with an ACE bandage.  Diet  Resume your normal diet. There are not special food restrictions following this procedure. In order to heal from your surgery, it is CRITICAL to get adequate nutrition. Your body requires vitamins, minerals, and protein. Vegetables are the best source of vitamins and minerals. Vegetables also provide the perfect balance of protein. Processed food has little nutritional value, so try to avoid this.  Medications  Resume taking all of your medications. If your incision is causing pain, you may take over-the counter pain relievers such as acetaminophen (Tylenol). If you were prescribed a stronger pain medication, please be aware these medications can cause nausea and constipation. Prevent  nausea by taking the medication with a snack or meal. Avoid constipation by drinking plenty of fluids and eating foods with high amount of fiber, such as fruits, vegetables, and grains.  Do not take Tylenol if you are taking prescription pain medications.  Follow up Your surgeon may want to see you in the office following your access surgery. If so, this will be arranged at the time of your surgery.  Please call us immediately for any of the following conditions:  Increased pain, redness, drainage (pus) from your incision site Fever of 101 degrees or higher Severe or worsening pain at your incision site Hand pain or numbness.  Reduce your risk of vascular disease:  Stop smoking. If you would like help, call [facility] at 1-800-QUIT-NOW 418-727-3137) or [place] at [place] your cholesterol Maintain a desired weight Control your diabetes Keep your blood pressure down  Dialysis  It will take several weeks to several months for your new dialysis access to be ready for use. Your surgeon will determine when it is okay to use it. Your nephrologist will continue to direct your dialysis. You can continue to use your Permcath until your new access is ready for use.   01/23/2021 Wanda Ramirez [id number] 1970-07-20  Surgeon(s): Wanda Mitchell, MD  Procedure(s): INSERTION OF RIGHT UPPER ARM ARTERIOVENOUS (AV) GORE-TEX GRAFT, 4-7 mm x 45 cm STRETCH VASCULAR GRAFT  x Do Not stick graft for 4 weeks.    If you have any questions, please call the office at [phone].

## 2021-01-23 NOTE — Anesthesia Procedure Notes (Signed)
Anesthesia Regional Block: Supraclavicular block   Pre-Anesthetic Checklist: , timeout performed,  Correct Patient, Correct Site, Correct Laterality,  Correct Procedure, Correct Position, site marked,  Risks and benefits discussed,  Surgical consent,  Pre-op evaluation,  At surgeon's request and post-op pain management  Laterality: Upper and Right  Prep: chloraprep       Needles:  Injection technique: Single-shot  Needle Type: Echogenic Needle     Needle Length: 5cm  Needle Gauge: 21     Additional Needles:   Procedures:,,,, ultrasound used (permanent image in chart),,    Narrative:  Start time: 01/23/2021 1:55 PM End time: 01/23/2021 2:06 PM Injection made incrementally with aspirations every 5 mL.  Performed by: Personally  Anesthesiologist: Barnet Glasgow, MD

## 2021-01-23 NOTE — Transfer of Care (Signed)
Immediate Anesthesia Transfer of Care Note  Patient: Wanda Ramirez  Procedure(s) Performed: INSERTION OF RIGHT UPPER ARM ARTERIOVENOUS (AV) GORE-TEX GRAFT, 4-7 mm x 45 cm STRETCH VASCULAR GRAFT (Right)  Patient Location: PACU  Anesthesia Type:MAC combined with regional for post-op pain  Level of Consciousness: awake, alert  and patient cooperative  Airway & Oxygen Therapy: Patient Spontanous Breathing  Post-op Assessment: Report given to RN and Post -op Vital signs reviewed and stable  Post vital signs: Reviewed and stable  Last Vitals:  Vitals Value Taken Time  BP    Temp 36.4 C 01/23/21 1648  Pulse 84 01/23/21 1650  Resp 16 01/23/21 1650  SpO2 100 % 01/23/21 1650  Vitals shown include unvalidated device data.  Last Pain:  Vitals:   01/23/21 1021  TempSrc:   PainSc: 7          Complications: No notable events documented.

## 2021-01-23 NOTE — Progress Notes (Signed)
Notified Dr.Houser of pt's glucose of 229, K+5.5. He is coming to see patient.

## 2021-01-23 NOTE — Interval H&P Note (Signed)
History and Physical Interval Note:  01/23/2021 2:11 PM  Wanda Ramirez  has presented today for surgery, with the diagnosis of ESRD.  The various methods of treatment have been discussed with the patient and family. After consideration of risks, benefits and other options for treatment, the patient has consented to  Procedure(s): INSERTION OF RIGHT UPPER ARM ARTERIOVENOUS (AV) GORE-TEX GRAFT (Right) as a surgical intervention.  The patient's history has been reviewed, patient examined, no change in status, stable for surgery.  I have reviewed the patient's chart and labs.  Questions were answered to the patient's satisfaction.     [name]

## 2021-01-24 ENCOUNTER — Encounter (HOSPITAL_COMMUNITY): Payer: Self-pay | Admitting: Surgery

## 2021-03-04 ENCOUNTER — Emergency Department (HOSPITAL_COMMUNITY): Payer: Medicaid Other

## 2021-03-04 ENCOUNTER — Inpatient Hospital Stay (HOSPITAL_COMMUNITY)
Admission: EM | Admit: 2021-03-04 | Discharge: 2021-03-06 | DRG: 312 | Disposition: A | Discharge status: Home / Self Care | Payer: Medicaid Other | Attending: Family Medicine | Admitting: Family Medicine

## 2021-03-04 DIAGNOSIS — Z7985 Long-term (current) use of injectable non-insulin antidiabetic drugs: Secondary | ICD-10-CM

## 2021-03-04 DIAGNOSIS — E1165 Type 2 diabetes mellitus with hyperglycemia: Secondary | ICD-10-CM | POA: Diagnosis present

## 2021-03-04 DIAGNOSIS — E785 Hyperlipidemia, unspecified: Secondary | ICD-10-CM | POA: Diagnosis present

## 2021-03-04 DIAGNOSIS — Z9981 Dependence on supplemental oxygen: Secondary | ICD-10-CM

## 2021-03-04 DIAGNOSIS — Z7982 Long term (current) use of aspirin: Secondary | ICD-10-CM

## 2021-03-04 DIAGNOSIS — G894 Chronic pain syndrome: Secondary | ICD-10-CM | POA: Diagnosis present

## 2021-03-04 DIAGNOSIS — J189 Pneumonia, unspecified organism: Secondary | ICD-10-CM

## 2021-03-04 DIAGNOSIS — E8889 Other specified metabolic disorders: Secondary | ICD-10-CM | POA: Diagnosis present

## 2021-03-04 DIAGNOSIS — R931 Abnormal findings on diagnostic imaging of heart and coronary circulation: Secondary | ICD-10-CM

## 2021-03-04 DIAGNOSIS — R569 Unspecified convulsions: Secondary | ICD-10-CM | POA: Diagnosis present

## 2021-03-04 DIAGNOSIS — Z794 Long term (current) use of insulin: Secondary | ICD-10-CM

## 2021-03-04 DIAGNOSIS — E039 Hypothyroidism, unspecified: Secondary | ICD-10-CM | POA: Diagnosis not present

## 2021-03-04 DIAGNOSIS — Z885 Allergy status to narcotic agent status: Secondary | ICD-10-CM

## 2021-03-04 DIAGNOSIS — I953 Hypotension of hemodialysis: Principal | ICD-10-CM | POA: Diagnosis present

## 2021-03-04 DIAGNOSIS — Z83438 Family history of other disorder of lipoprotein metabolism and other lipidemia: Secondary | ICD-10-CM

## 2021-03-04 DIAGNOSIS — Z79890 Hormone replacement therapy (postmenopausal): Secondary | ICD-10-CM

## 2021-03-04 DIAGNOSIS — E1169 Type 2 diabetes mellitus with other specified complication: Secondary | ICD-10-CM | POA: Diagnosis present

## 2021-03-04 DIAGNOSIS — Z79899 Other long term (current) drug therapy: Secondary | ICD-10-CM

## 2021-03-04 DIAGNOSIS — Z87891 Personal history of nicotine dependence: Secondary | ICD-10-CM

## 2021-03-04 DIAGNOSIS — H409 Unspecified glaucoma: Secondary | ICD-10-CM | POA: Diagnosis present

## 2021-03-04 DIAGNOSIS — Z6839 Body mass index (BMI) 39.0-39.9, adult: Secondary | ICD-10-CM

## 2021-03-04 DIAGNOSIS — Z7951 Long term (current) use of inhaled steroids: Secondary | ICD-10-CM

## 2021-03-04 DIAGNOSIS — G4733 Obstructive sleep apnea (adult) (pediatric): Secondary | ICD-10-CM | POA: Diagnosis present

## 2021-03-04 DIAGNOSIS — R55 Syncope and collapse: Secondary | ICD-10-CM

## 2021-03-04 DIAGNOSIS — J45909 Unspecified asthma, uncomplicated: Secondary | ICD-10-CM | POA: Diagnosis present

## 2021-03-04 DIAGNOSIS — E669 Obesity, unspecified: Secondary | ICD-10-CM | POA: Diagnosis present

## 2021-03-04 DIAGNOSIS — E1142 Type 2 diabetes mellitus with diabetic polyneuropathy: Secondary | ICD-10-CM | POA: Diagnosis present

## 2021-03-04 DIAGNOSIS — Z833 Family history of diabetes mellitus: Secondary | ICD-10-CM

## 2021-03-04 DIAGNOSIS — D72829 Elevated white blood cell count, unspecified: Secondary | ICD-10-CM

## 2021-03-04 DIAGNOSIS — J9611 Chronic respiratory failure with hypoxia: Secondary | ICD-10-CM | POA: Diagnosis present

## 2021-03-04 DIAGNOSIS — I252 Old myocardial infarction: Secondary | ICD-10-CM

## 2021-03-04 DIAGNOSIS — I959 Hypotension, unspecified: Secondary | ICD-10-CM

## 2021-03-04 DIAGNOSIS — N186 End stage renal disease: Secondary | ICD-10-CM | POA: Diagnosis present

## 2021-03-04 DIAGNOSIS — Z20822 Contact with and (suspected) exposure to covid-19: Secondary | ICD-10-CM | POA: Diagnosis present

## 2021-03-04 DIAGNOSIS — E1122 Type 2 diabetes mellitus with diabetic chronic kidney disease: Secondary | ICD-10-CM | POA: Diagnosis present

## 2021-03-04 DIAGNOSIS — I248 Other forms of acute ischemic heart disease: Secondary | ICD-10-CM | POA: Diagnosis present

## 2021-03-04 DIAGNOSIS — Z992 Dependence on renal dialysis: Secondary | ICD-10-CM

## 2021-03-04 DIAGNOSIS — Z841 Family history of disorders of kidney and ureter: Secondary | ICD-10-CM

## 2021-03-04 DIAGNOSIS — D631 Anemia in chronic kidney disease: Secondary | ICD-10-CM | POA: Diagnosis present

## 2021-03-04 LAB — TROPONIN I (HIGH SENSITIVITY)
Troponin I (High Sensitivity): 30 ng/L — ABNORMAL HIGH (ref <18)
Troponin I (High Sensitivity): 56 ng/L — ABNORMAL HIGH (ref <18)
Troponin I (High Sensitivity): 41 ng/L — ABNORMAL HIGH (ref <18)
Troponin I (High Sensitivity): 39 ng/L — ABNORMAL HIGH (ref <18)

## 2021-03-04 LAB — CBC WITH DIFFERENTIAL/PLATELET
Abs Immature Granulocytes: 0.09 10*3/uL — ABNORMAL HIGH (ref 0.00–0.07)
Basophils Absolute: 0.1 10*3/uL (ref 0.0–0.1)
Basophils Relative: 1 %
Eosinophils Absolute: 0.2 10*3/uL (ref 0.0–0.5)
Eosinophils Relative: 1 %
HCT: 33.7 % — ABNORMAL LOW (ref 36.0–46.0)
Hemoglobin: 11.5 g/dL — ABNORMAL LOW (ref 12.0–15.0)
Immature Granulocytes: 1 %
Lymphocytes Relative: 9 %
Lymphs Abs: 1.6 10*3/uL (ref 0.7–4.0)
MCH: 30.2 pg (ref 26.0–34.0)
MCHC: 34.1 g/dL (ref 30.0–36.0)
MCV: 88.5 fL (ref 80.0–100.0)
Monocytes Absolute: 0.9 10*3/uL (ref 0.1–1.0)
Monocytes Relative: 5 %
Neutro Abs: 14.6 10*3/uL — ABNORMAL HIGH (ref 1.7–7.7)
Neutrophils Relative %: 83 %
Platelets: 271 10*3/uL (ref 150–400)
RBC: 3.81 MIL/uL — ABNORMAL LOW (ref 3.87–5.11)
RDW: 18.2 % — ABNORMAL HIGH (ref 11.5–15.5)
WBC: 17.5 10*3/uL — ABNORMAL HIGH (ref 4.0–10.5)
nRBC: 0 % (ref 0.0–0.2)

## 2021-03-04 LAB — BASIC METABOLIC PANEL
Anion gap: 17 — ABNORMAL HIGH (ref 5–15)
BUN: 34 mg/dL — ABNORMAL HIGH (ref 6–20)
CO2: 22 mmol/L (ref 22–32)
Calcium: 8.7 mg/dL — ABNORMAL LOW (ref 8.9–10.3)
Chloride: 93 mmol/L — ABNORMAL LOW (ref 98–111)
Creatinine, Ser: 7.91 mg/dL — ABNORMAL HIGH (ref 0.44–1.00)
GFR, Estimated: 6 mL/min — ABNORMAL LOW (ref >60)
Glucose, Bld: 328 mg/dL — ABNORMAL HIGH (ref 70–99)
Potassium: 4 mmol/L (ref 3.5–5.1)
Sodium: 132 mmol/L — ABNORMAL LOW (ref 135–145)

## 2021-03-04 LAB — LACTIC ACID, PLASMA: Lactic Acid, Venous: 1.8 mmol/L (ref 0.5–1.9)

## 2021-03-04 MED ORDER — SODIUM CHLORIDE 0.9 % IV BOLUS
250.0000 mL | Freq: Once | INTRAVENOUS | Status: AC
  Administered 2021-03-04: 250 mL via INTRAVENOUS

## 2021-03-04 MED ORDER — SODIUM CHLORIDE 0.9 % IV SOLN
500.0000 mg | INTRAVENOUS | Status: DC
  Administered 2021-03-05 – 2021-03-06 (×3): 500 mg via INTRAVENOUS
  Filled 2021-03-04 (×3): qty 500

## 2021-03-04 MED ORDER — SODIUM CHLORIDE 0.9 % IV BOLUS: 250.0000 mL | Freq: Once | INTRAVENOUS | Status: DC

## 2021-03-04 MED ORDER — SODIUM CHLORIDE 0.9 % IV SOLN
1.0000 g | Freq: Once | INTRAVENOUS | Status: AC
  Administered 2021-03-04: 1 g via INTRAVENOUS
  Filled 2021-03-04: qty 10

## 2021-03-04 MED ORDER — MIDODRINE HCL 5 MG PO TABS
10.0000 mg | ORAL_TABLET | Freq: Three times a day (TID) | ORAL | Status: DC
  Administered 2021-03-05 – 2021-03-06 (×7): 10 mg via ORAL
  Filled 2021-03-04 (×7): qty 2

## 2021-03-04 MED ORDER — DOXYCYCLINE HYCLATE 100 MG PO CAPS: 100.0000 mg | ORAL_CAPSULE | Freq: Two times a day (BID) | ORAL | 0 refills | Status: AC

## 2021-03-04 MED ORDER — CEFUROXIME AXETIL 250 MG PO TABS: 250.0000 mg | ORAL_TABLET | ORAL | 0 refills | Status: AC

## 2021-03-04 MED ORDER — INSULIN ASPART 100 UNIT/ML IJ SOLN
0.0000 [IU] | Freq: Every day | INTRAMUSCULAR | Status: DC
  Administered 2021-03-05: 2 [IU] via SUBCUTANEOUS

## 2021-03-04 MED ORDER — INSULIN ASPART 100 UNIT/ML IJ SOLN
0.0000 [IU] | Freq: Three times a day (TID) | INTRAMUSCULAR | Status: DC
  Administered 2021-03-05: 1 [IU] via SUBCUTANEOUS
  Administered 2021-03-05: 3 [IU] via SUBCUTANEOUS
  Administered 2021-03-06: 2 [IU] via SUBCUTANEOUS
  Administered 2021-03-06 (×2): 1 [IU] via SUBCUTANEOUS

## 2021-03-04 MED ORDER — HEPARIN SODIUM (PORCINE) 5000 UNIT/ML IJ SOLN
5000.0000 [IU] | Freq: Three times a day (TID) | INTRAMUSCULAR | Status: DC
  Administered 2021-03-05 – 2021-03-06 (×5): 5000 [IU] via SUBCUTANEOUS
  Filled 2021-03-04 (×5): qty 1

## 2021-03-04 MED ORDER — CEFTRIAXONE SODIUM 1 G IJ SOLR
1.0000 g | INTRAMUSCULAR | Status: DC
  Administered 2021-03-05 – 2021-03-06 (×2): 1 g via INTRAVENOUS
  Filled 2021-03-04 (×2): qty 10

## 2021-03-04 NOTE — ED Provider Notes (Signed)
[facility] EMERGENCY DEPARTMENT Provider Note   CSN: [id number] Arrival date & time: 03/04/21  1109     History Chief Complaint  Patient presents with   Hypotension   Weakness    Wanda Ramirez is a 50 y.o. female.  Patient was sent from dialysis because of low blood pressure.  And possible seizure or syncopal episode  The history is provided by the patient and medical records. No language interpreter was used.  Weakness Severity:  Mild Onset quality:  Sudden Timing:  Intermittent Progression:  Waxing and waning Chronicity:  New Context: not alcohol use   Relieved by:  Nothing Worsened by:  Nothing Ineffective treatments:  None tried Associated symptoms: no abdominal pain, no chest pain, no cough, no diarrhea, no frequency, no headaches and no seizures       Past Medical History:  Diagnosis Date   Anemia    Anxiety    Arthritis    Asthma    Chronic back pain    Diabetes mellitus ([place])    Type II   Dyspnea    with exertion - 4L oxygen via [place]   ESRD (end stage renal disease) on dialysis ([place])    [facility] since 2016   Essential hypertension    GERD (gastroesophageal reflux disease)    Glaucoma    History of blood transfusion    Hyperlipidemia    Hypothyroidism    Morbid obesity ([place])    Myocardial infarction ([place])    mild per patient   Peripheral neuropathy    left foot   Requires continuous at home supplemental oxygen    4L via    Sleep apnea    wears CPAP, does not know setting    Patient Active Problem List   Diagnosis Date Noted   Allergy, unspecified, initial encounter 11/27/2020   Anaphylactic shock, unspecified, initial encounter [id number]   Complication associated with dialysis catheter 10/12/2020   Anemia 09/20/2020   CKD (chronic kidney disease) stage 5, GFR less than 15 ml/min ([place]) 09/20/2020   ESRD on hemodialysis ([place]) 09/20/2020   Renal disorder 09/20/2020   Mild protein-calorie  malnutrition ([place]) 06/07/2020   History of COVID-19 05/31/2020   Post covid-19 condition, unspecified 05/30/2020   Debility 05/15/2020   Sacral wound    Palliative care encounter    Septic shock (HCC)    Cellulitis of buttock    Fever    Status post tracheostomy ([place])    Aspiration into airway    Pressure injury of skin 03/07/2020   ARDS (adult respiratory distress syndrome) (HCC)    Acute respiratory failure with hypoxemia (HCC)    Encephalopathy acute    HCAP (healthcare-associated pneumonia)    Dyspnea, unspecified 02/13/2020   Cough 02/09/2020   Abnormal myocardial perfusion study 07/26/2018   Silent myocardial infarction ([place]) 07/15/2018   Normal coronary arteries 04/07/2018   Hypercalcemia 04/21/2016   Encounter for antibody response examination 03/26/2016   Weakness 03/17/2016   Hypotension 03/17/2016   Hyperlipidemia 03/17/2016   Peripheral neuropathy 03/17/2016   Precordial chest pain 03/17/2016   End stage renal disease on dialysis ([place]) 03/16/2016   Diabetes mellitus type 2 in obese ([place]) 03/16/2016   Essential hypertension 03/16/2016   Hypothyroidism 03/16/2016   Glaucoma 03/16/2016   GERD (gastroesophageal reflux disease) 03/16/2016   OSA (obstructive sleep apnea) 03/16/2016   Fluid overload, unspecified 10/23/2015   Diabetic polyneuropathy associated with type 2 diabetes mellitus ([place]) 09/28/2015   Long-term  insulin use ([place]) 09/28/2015   Morbid (severe) obesity due to excess calories ([place]) 09/28/2015   Encounter for removal of sutures 02/28/2015   Iron deficiency anemia, unspecified 01/27/2015   Coagulation defect, unspecified ([place]) 01/16/2015   Epilepsy, unspecified, not intractable, without status epilepticus ([place]) 01/16/2015   Family history of disorders of kidney and ureter 01/16/2015   Gout, unspecified 01/16/2015   Other disorders of phosphorus metabolism 01/16/2015   Pain, unspecified 01/16/2015   Pruritus, unspecified 01/16/2015   Secondary  hyperparathyroidism of renal origin ([place]) 01/16/2015   Unspecified background retinopathy 01/16/2015   Unspecified osteoarthritis, unspecified site 01/16/2015    Past Surgical History:  Procedure Laterality Date   AV FISTULA PLACEMENT Left 04/2014   AV FISTULA PLACEMENT Right 08/15/2020   Procedure: RIGHT BRACHIOCEPHALIC ARTERIOVENOUS (AV) FISTULA CREATION;  Surgeon: Waynetta Sandy, MD;  Location: [place];  Service: Vascular;  Laterality: Right;   AV FISTULA PLACEMENT Right 01/23/2021   Procedure: INSERTION OF RIGHT UPPER ARM ARTERIOVENOUS (AV) GORE-TEX GRAFT, 4-7 mm x 45 cm STRETCH VASCULAR GRAFT;  Surgeon: Serafina Mitchell, MD;  Location: [place];  Service: Cardiovascular;  Laterality: Right;   CARDIAC CATHETERIZATION N/A 03/17/2016   Procedure: Left Heart Cath and Coronary Angiography;  Surgeon: [name], MD;  Location: [place] [facility];  Service: Cardiovascular;  Laterality: N/A;   INCISION AND DRAINAGE PERIRECTAL ABSCESS N/A 04/03/2020   Procedure: EXCISIONAL DEBRIDEMENT OF SACRAL AND GLUTEAL WOUNDS;  Surgeon: [name], MD;  Location: [place];  Service: General;  Laterality: N/A;   IR FLUORO GUIDE CV LINE LEFT  02/22/2020   IR FLUORO GUIDE CV LINE LEFT  02/27/2020   IR FLUORO GUIDE CV LINE LEFT  05/30/2020   IR FLUORO GUIDE CV LINE LEFT  10/10/2020   IR FLUORO GUIDE CV LINE LEFT  10/12/2020   IR FLUORO GUIDE CV LINE LEFT  01/17/2021   IR US GUIDE VASC ACCESS LEFT  02/22/2020   KNEE SURGERY     LIGATION OF COMPETING BRANCHES OF ARTERIOVENOUS FISTULA Right 11/14/2020   Procedure: LIGATION OF COMPETING BRANCHES AND SUPERFICIALIZATION OF RIGHT ARTERIOVENOUS FISTULA;  Surgeon: Waynetta Sandy, MD;  Location: [place];  Service: Vascular;  Laterality: Right;   TUBAL LIGATION     UPPER EXTREMITY VENOGRAPHY Bilateral 01/14/2021   Procedure: UPPER EXTREMITY VENOGRAPHY;  Surgeon: Waynetta Sandy, MD;  Location: [place] [facility];  Service: Cardiovascular;   Laterality: Bilateral;     OB History   No obstetric history on file.     Family History  Problem Relation Age of Onset   Diabetes Mother    Hyperlipidemia Mother    Kidney disease Father     Social History   Tobacco Use   Smoking status: Former    Packs/day: 0.50    Years: 25.00    Pack years: 12.50    Types: Cigarettes   Smokeless tobacco: Never   Tobacco comments:    Smoked x 2 yrs  Vaping Use   Vaping Use: Never used  Substance Use Topics   Alcohol use: No    Comment: years ago   Drug use: No    Home Medications Prior to Admission medications   Medication Sig Start Date End Date Taking? Authorizing Provider  acetaminophen (TYLENOL) 325 MG tablet Take 1-2 tablets (325-650 mg total) by mouth every 4 (four) hours as needed for mild pain. 05/29/20   Love, Ivan Anchors, PA-C  albuterol (VENTOLIN HFA) 108 (90 Base) MCG/ACT inhaler Inhale  1-2 puffs into the lungs every 6 (six) hours as needed for wheezing or shortness of breath.    [provider]  aspirin EC 81 MG tablet Take 81 mg by mouth in the morning. Swallow whole.    [provider]  AURYXIA 1 GM 210 MG(Fe) tablet Take 630 mg by mouth 3 (three) times daily with meals. 06/21/20   [provider]  B Complex-C (B-COMPLEX WITH VITAMIN C) tablet Take 1 tablet by mouth daily. Patient taking differently: Take 1 tablet by mouth in the morning. 05/16/20   [place], Carley D, DO  Brinzolamide-Brimonidine (SIMBRINZA) 1-0.2 % SUSP Place 1 drop into both eyes in the morning, at noon, and at bedtime.    [provider]  cinacalcet (SENSIPAR) 30 MG tablet Take 30 mg by mouth daily.    [provider]  Dulaglutide (TRULICITY) A999333 [id number] SOPN Inject 0.75 mg into the skin every Sunday. 08/28/20   [provider]  fluticasone furoate-vilanterol (BREO ELLIPTA) 100-25 MCG/INH AEPB Inhale 1 puff into the lungs in the morning.    [provider]  gabapentin (NEURONTIN) 100 MG  capsule Take 100 mg by mouth 2 (two) times daily.    [provider]  insulin aspart (NOVOLOG FLEXPEN) 100 UNIT/ML FlexPen Inject 4 Units into the skin 3 (three) times daily with meals.    [provider]  insulin glargine (LANTUS) 100 unit/mL SOPN Inject 4 Units into the skin at bedtime. Patient taking differently: Inject 8 Units into the skin at bedtime. 05/30/20   Love, Ivan Anchors, PA-C  levothyroxine (SYNTHROID) 125 MCG tablet Take 1 tablet (125 mcg total) by mouth daily before breakfast. 05/29/20   Love, Ivan Anchors, PA-C  lip balm (CARMEX) ointment Apply topically as needed for lip care. Patient taking differently: Apply 1 application topically as needed for lip care. 05/15/20   [place], Carley D, DO  midodrine (PROAMATINE) 10 MG tablet Take 1 tablet (10 mg total) by mouth 3 (three) times daily with meals. 05/29/20   Love, Ivan Anchors, PA-C  montelukast (SINGULAIR) 10 MG tablet Take 10 mg by mouth in the morning.    [provider]  Netarsudil Dimesylate (RHOPRESSA) 0.02 % SOLN Place 1 drop into both eyes at bedtime.    [provider]  Oxycodone HCl 10 MG TABS Take 10 mg by mouth 4 (four) times daily as needed (pain). 07/26/20   [provider]  oxyCODONE-acetaminophen (PERCOCET) 5-325 MG tablet Take 1 tablet by mouth every 6 (six) hours as needed for severe pain. 11/14/20 11/14/21  Dagoberto Ligas, PA-C  OXYGEN Inhale 4 L into the lungs continuous.    [provider]  pantoprazole (PROTONIX) 40 MG tablet Take 1 tablet (40 mg total) by mouth daily. Patient taking differently: Take 40 mg by mouth in the morning. 05/29/20   Love, Ivan Anchors, PA-C  QUEtiapine (SEROQUEL) 50 MG tablet Take 1 tablet (50 mg total) by mouth at bedtime. 05/29/20   Love, Ivan Anchors, PA-C    Allergies    Hydrocodone  Review of Systems   Review of Systems  Constitutional:  Negative for appetite change and fatigue.  HENT:  Negative for congestion, ear discharge and sinus  pressure.   Eyes:  Negative for discharge.  Respiratory:  Negative for cough.   Cardiovascular:  Negative for chest pain.  Gastrointestinal:  Negative for abdominal pain and diarrhea.  Genitourinary:  Negative for frequency and hematuria.  Musculoskeletal:  Negative for back pain.  Skin:  Negative  for rash.  Neurological:  Positive for weakness. Negative for seizures and headaches.  Psychiatric/Behavioral:  Negative for hallucinations.    Physical Exam Updated Vital Signs BP 104/68   Pulse 91   Temp 98 F (36.7 C) (Oral)   Resp 20   Ht '5\' 2"'$  (1.575 m)   Wt 97.5 kg   SpO2 100%   BMI 39.32 kg/m   Physical Exam Vitals reviewed.  Constitutional:      Appearance: She is well-developed.  HENT:     Head: Normocephalic.     Nose: Nose normal.  Eyes:     General: No scleral icterus.    Conjunctiva/sclera: Conjunctivae normal.  Neck:     Thyroid: No thyromegaly.  Cardiovascular:     Rate and Rhythm: Normal rate and regular rhythm.     Heart sounds: No murmur heard.   No friction rub. No gallop.  Pulmonary:     Breath sounds: No stridor. No wheezing or rales.  Chest:     Chest wall: No tenderness.  Abdominal:     General: There is no distension.     Tenderness: There is no abdominal tenderness. There is no rebound.  Musculoskeletal:        General: Normal range of motion.     Cervical back: Neck supple.  Lymphadenopathy:     Cervical: No cervical adenopathy.  Skin:    Findings: No erythema or rash.  Neurological:     Mental Status: She is alert and oriented to person, place, and time.     Motor: No abnormal muscle tone.     Coordination: Coordination normal.  Psychiatric:        Behavior: Behavior normal.    ED Results / Procedures / Treatments   Labs (all labs ordered are listed, but only abnormal results are displayed) Labs Reviewed  CBC WITH DIFFERENTIAL/PLATELET - Abnormal; Notable for the following components:      Result Value   WBC 17.5 (*)    RBC 3.81  (*)    Hemoglobin 11.5 (*)    HCT 33.7 (*)    RDW 18.2 (*)    Neutro Abs 14.6 (*)    Abs Immature Granulocytes 0.09 (*)    All other components within normal limits  BASIC METABOLIC PANEL - Abnormal; Notable for the following components:   Sodium 132 (*)    Chloride 93 (*)    Glucose, Bld 328 (*)    BUN 34 (*)    Creatinine, Ser 7.91 (*)    Calcium 8.7 (*)    GFR, Estimated 6 (*)    Anion gap 17 (*)    All other components within normal limits  TROPONIN I (HIGH SENSITIVITY) - Abnormal; Notable for the following components:   Troponin I (High Sensitivity) 30 (*)    All other components within normal limits  TROPONIN I (HIGH SENSITIVITY)    EKG EKG Interpretation  Date/Time:  Monday March 04 2021 11:15:23 EDT Ventricular Rate:  91 PR Interval:  150 QRS Duration: 84 QT Interval:  376 QTC Calculation: 463 R Axis:   90 Text Interpretation: Sinus rhythm Ventricular premature complex Consider right atrial enlargement Borderline right axis deviation Confirmed by Milton Ferguson [phone]) on 03/04/2021 11:30:06 AM  Radiology DG Chest Port 1 View  Result Date: 03/04/2021 CLINICAL DATA:  Shortness of breath for 1 week. History of hypotension. EXAM: PORTABLE CHEST 1 VIEW COMPARISON:  Radiographs 02/25/2021 and 10/11/2020. FINDINGS: 1155 hours. Left IJ hemodialysis catheter projects at the level of  the mid right atrium. The heart size and mediastinal contours are stable. There is chronic lung disease with parenchymal scarring bilaterally, right greater than left. Interval increased density peripherally in the mid right lung which could reflect atelectasis or a focal infiltrate. There is no overt pulmonary edema, significant pleural effusion or pneumothorax. The bones appear unchanged. Multiple telemetry leads overlie the chest. IMPRESSION: 1. Interval increased focal density peripherally in the mid right lung which could reflect atelectasis or a focal infiltrate. Recommend short-term  radiographic follow-up. 2. Otherwise stable chest with bilateral parenchymal scarring. Hemodialysis catheter appears unchanged. Electronically Signed   By: Richardean Sale M.D.   On: 03/04/2021 12:22    Procedures Procedures   Medications Ordered in ED Medications  sodium chloride 0.9 % bolus 250 mL (0 mLs Intravenous Stopped 03/04/21 1619)    ED Course  I have reviewed the triage vital signs and the nursing notes.  Pertinent labs & imaging results that were available during my care of the patient were reviewed by me and considered in my medical decision making (see chart for details).  Dialysis patient.  Hypotension that has been resolved with saline.  We are awaiting her second troponin she can possibly be discharged home.  Disposition will be done by my colleague MDM Rules/Calculators/A&P      Patient with hypotension secondary to dehydration from dialysis                      Final Clinical Impression(s) / ED Diagnoses Final diagnoses:  None    Rx / DC Orders ED Discharge Orders     None        Milton Ferguson, MD 03/09/21 1027

## 2021-03-04 NOTE — ED Triage Notes (Addendum)
Pt to ED via EMS from dialysis c/o Weakness, pt received 2hr 45 min of 4 hour treatment. Pt noted to be hypotensive on EMS arrival, systolic in [id number]. . Home o2 user '@5'$  L. Last VS: 110/80, P 80. 100%4L Cbg 273. HX: CHF, HTN, DM, COPD .EMS gave '324mg'$  aspirin for reports of chest pain, pt currently does not report.

## 2021-03-04 NOTE — ED Provider Notes (Signed)
Sign out note   Episode of weakness, hypotention at dialysis. Given small fluid bolus. No longer hypotensive and currently asymptoamtic. Did endorse dry cough. No fevers at home. Labs with leukocytosis, CXR with possible infiltrate. Plan to check second trop. If neg, dc.   4:30 PM received sign out from [place] - f/u on repeat trop, then dc if stable  6:53 PM reassessed, asymptomatic, BP stable; given cough and white count, will cover possible infiltrate seen on cxr with abx. Discussed with jonathan PharmD. Rec cefuroxime, doxy.   7:48 PM repeat trop is mildly more elevated. Given troponin elevation, WBC, episodic hypotension, believe she would benefit from observation admission tonight. Will consult medicine.    Lucrezia Starch, MD 03/04/21 1949

## 2021-03-04 NOTE — Discharge Instructions (Addendum)
Take antibiotics as prescribed for possible pneumonia.  Please follow-up with your primary care doctor.  Discussed the episode from today and the possible pneumonia.  The radiologist recommended checking a repeat chest x-ray at some point in the next couple weeks to ensure resolution.  On dialysis days, please take the prescribed cefuroxime after dialysis.  If you have any episodes of passing out, lightheadedness, chest pain or difficulty in breathing, fever, come back to ER for reassessment.

## 2021-03-04 NOTE — H&P (Signed)
History and Physical    JAKARRA LEITHEAD [id number] DOB: 05/26/1971 DOA: 03/04/2021  PCP: Darrol Jump, NP Patient coming from: Home  Chief Complaint: Hypotension  HPI: Wanda Ramirez is a 50 y.o. female with medical history significant of ESRD on HD TTS, chronic hypotension on midodrine, anemia of chronic kidney disease, insulin-dependent type 2 diabetes, hypothyroidism, hyperlipidemia, chronic pain syndrome, asthma, chronic hypoxemic respiratory failure on home oxygen, OSA on CPAP, class II obesity (BMI 39.32).  She was sent to the ED from her dialysis center due to low blood pressure and concern for possible seizure or syncopal episode.  She received 2 hour 45 minutes of 4 hour dialysis treatment.  Patient complained of chest pain and was given aspirin 324 mg by EMS. She was noted to be hypotensive with systolic in the [id number].  Hypotension resolved after a small 250 cc fluid bolus.  Not febrile or tachycardic.  No change in oxygen requirement from baseline.  Labs notable for WBC 17.5.  Hemoglobin 11.5, stable.  Sodium 132.  Normal potassium and bicarb.  Blood glucose 328.  Initial high-sensitivity troponin 30, repeat 56.  EKG without acute ischemic changes.  COVID and influenza PCR pending.  Chest x-ray showing density peripherally in the mid right lung suspicious for focal infiltrate.  CT head negative for acute findings.  Patient states she was at dialysis today when her blood pressure dropped and they were concerned that she might of had a seizure as her eyes were glassy.  She is not sure if she passed out.  Does recall experiencing chest tightness at that time which has now resolved.  No shortness of breath.  She does experience chest tightness at home especially when climbing steps and thinks it is related to her asthma as it resolves each time after she uses her inhaler.  She started coughing lately but not having any fevers.  She is vaccinated against COVID.  States her blood pressure  normally runs low for which she was prescribed midodrine but she only takes it as needed when her blood pressure is low.  She uses 5 L home oxygen as needed only with exertion but does not require oxygen at rest.  She uses CPAP at night.  No other complaints.  Review of Systems:  All systems reviewed and apart from history of presenting illness, are negative.  Past Medical History:  Diagnosis Date   Anemia    Anxiety    Arthritis    Asthma    Chronic back pain    Diabetes mellitus ([place])    Type II   Dyspnea    with exertion - 4L oxygen via [place]   ESRD (end stage renal disease) on dialysis ([place])    [facility] since 2016   Essential hypertension    GERD (gastroesophageal reflux disease)    Glaucoma    History of blood transfusion    Hyperlipidemia    Hypothyroidism    Morbid obesity ([place])    Myocardial infarction ([place])    mild per patient   Peripheral neuropathy    left foot   Requires continuous at home supplemental oxygen    4L via [place]   Sleep apnea    wears CPAP, does not know setting    Past Surgical History:  Procedure Laterality Date   AV FISTULA PLACEMENT Left 04/2014   AV FISTULA PLACEMENT Right 08/15/2020   Procedure: RIGHT BRACHIOCEPHALIC ARTERIOVENOUS (AV) FISTULA CREATION;  Surgeon: Waynetta Sandy, MD;  Location: [facility];  Service: Vascular;  Laterality: Right;   AV FISTULA PLACEMENT Right 01/23/2021   Procedure: INSERTION OF RIGHT UPPER ARM ARTERIOVENOUS (AV) GORE-TEX GRAFT, 4-7 mm x 45 cm STRETCH VASCULAR GRAFT;  Surgeon: Serafina Mitchell, MD;  Location: [facility];  Service: Cardiovascular;  Laterality: Right;   CARDIAC CATHETERIZATION N/A 03/17/2016   Procedure: Left Heart Cath and Coronary Angiography;  Surgeon: [name], MD;  Location: [facility];  Service: Cardiovascular;  Laterality: N/A;   INCISION AND DRAINAGE PERIRECTAL ABSCESS N/A 04/03/2020   Procedure: EXCISIONAL DEBRIDEMENT OF SACRAL AND GLUTEAL WOUNDS;   Surgeon: [name], MD;  Location: [facility];  Service: General;  Laterality: N/A;   IR FLUORO GUIDE CV LINE LEFT  02/22/2020   IR FLUORO GUIDE CV LINE LEFT  02/27/2020   IR FLUORO GUIDE CV LINE LEFT  05/30/2020   IR FLUORO GUIDE CV LINE LEFT  10/10/2020   IR FLUORO GUIDE CV LINE LEFT  10/12/2020   IR FLUORO GUIDE CV LINE LEFT  01/17/2021   IR US GUIDE VASC ACCESS LEFT  02/22/2020   KNEE SURGERY     LIGATION OF COMPETING BRANCHES OF ARTERIOVENOUS FISTULA Right 11/14/2020   Procedure: LIGATION OF COMPETING BRANCHES AND SUPERFICIALIZATION OF RIGHT ARTERIOVENOUS FISTULA;  Surgeon: Waynetta Sandy, MD;  Location: [facility];  Service: Vascular;  Laterality: Right;   TUBAL LIGATION     UPPER EXTREMITY VENOGRAPHY Bilateral 01/14/2021   Procedure: UPPER EXTREMITY VENOGRAPHY;  Surgeon: Waynetta Sandy, MD;  Location: [facility];  Service: Cardiovascular;  Laterality: Bilateral;     reports that she has quit smoking. Her smoking use included cigarettes. She has a 12.50 pack-year smoking history. She has never used smokeless tobacco. She reports that she does not drink alcohol and does not use drugs.  Allergies  Allergen Reactions   Hydrocodone Itching and Nausea And Vomiting    Family History  Problem Relation Age of Onset   Diabetes Mother    Hyperlipidemia Mother    Kidney disease Father     Prior to Admission medications   Medication Sig Start Date End Date Taking? Authorizing Provider  cefUROXime (CEFTIN) 250 MG tablet Take 1 tablet (250 mg total) by mouth daily for 5 days. 03/04/21 03/09/21 Yes Dykstra, Ellwood Dense, MD  doxycycline (VIBRAMYCIN) 100 MG capsule Take 1 capsule (100 mg total) by mouth 2 (two) times daily for 5 days. 03/04/21 03/09/21 Yes Dykstra, Ellwood Dense, MD  acetaminophen (TYLENOL) 325 MG tablet Take 1-2 tablets (325-650 mg total) by mouth every 4 (four) hours as needed for mild pain. 05/29/20   Love, Ivan Anchors, PA-C  albuterol (VENTOLIN HFA) 108 (90 Base)  MCG/ACT inhaler Inhale 1-2 puffs into the lungs every 6 (six) hours as needed for wheezing or shortness of breath.    [provider]  aspirin EC 81 MG tablet Take 81 mg by mouth in the morning. Swallow whole.    [provider]  AURYXIA 1 GM 210 MG(Fe) tablet Take 630 mg by mouth 3 (three) times daily with meals. 06/21/20   [provider]  B Complex-C (B-COMPLEX WITH VITAMIN C) tablet Take 1 tablet by mouth daily. Patient taking differently: Take 1 tablet by mouth in the morning. 05/16/20   Bloomfield, Carley D, DO  Brinzolamide-Brimonidine (SIMBRINZA) 1-0.2 % SUSP Place 1 drop into both eyes in the morning, at noon, and at bedtime.    [provider]  cinacalcet (SENSIPAR) 30 MG tablet  Take 30 mg by mouth daily.    [provider]  Dulaglutide (TRULICITY) A999333 [id number] SOPN Inject 0.75 mg into the skin every Sunday. 08/28/20   [provider]  fluticasone furoate-vilanterol (BREO ELLIPTA) 100-25 MCG/INH AEPB Inhale 1 puff into the lungs in the morning.    [provider]  gabapentin (NEURONTIN) 100 MG capsule Take 100 mg by mouth 2 (two) times daily.    [provider]  insulin aspart (NOVOLOG FLEXPEN) 100 UNIT/ML FlexPen Inject 4 Units into the skin 3 (three) times daily with meals.    [provider]  insulin glargine (LANTUS) 100 unit/mL SOPN Inject 4 Units into the skin at bedtime. Patient taking differently: Inject 8 Units into the skin at bedtime. 05/30/20   Love, Ivan Anchors, PA-C  levothyroxine (SYNTHROID) 125 MCG tablet Take 1 tablet (125 mcg total) by mouth daily before breakfast. 05/29/20   Love, Ivan Anchors, PA-C  lip balm (CARMEX) ointment Apply topically as needed for lip care. Patient taking differently: Apply 1 application topically as needed for lip care. 05/15/20   Bloomfield, Carley D, DO  midodrine (PROAMATINE) 10 MG tablet Take 1 tablet (10 mg total) by mouth 3 (three) times daily with meals. 05/29/20    Love, Ivan Anchors, PA-C  montelukast (SINGULAIR) 10 MG tablet Take 10 mg by mouth in the morning.    [provider]  Netarsudil Dimesylate (RHOPRESSA) 0.02 % SOLN Place 1 drop into both eyes at bedtime.    [provider]  Oxycodone HCl 10 MG TABS Take 10 mg by mouth 4 (four) times daily as needed (pain). 07/26/20   [provider]  oxyCODONE-acetaminophen (PERCOCET) 5-325 MG tablet Take 1 tablet by mouth every 6 (six) hours as needed for severe pain. 11/14/20 11/14/21  Dagoberto Ligas, PA-C  OXYGEN Inhale 4 L into the lungs continuous.    [provider]  pantoprazole (PROTONIX) 40 MG tablet Take 1 tablet (40 mg total) by mouth daily. Patient taking differently: Take 40 mg by mouth in the morning. 05/29/20   Love, Ivan Anchors, PA-C  QUEtiapine (SEROQUEL) 50 MG tablet Take 1 tablet (50 mg total) by mouth at bedtime. 05/29/20   Bary Leriche, PA-C    Physical Exam: Vitals:   03/04/21 1815 03/04/21 1830 03/04/21 1845 03/04/21 1950  BP:    117/77  Pulse: 84 89 89 85  Resp: '16 16 20 18  '$ Temp:      TempSrc:      SpO2: 97% 98% 97% 97%  Weight:      Height:        Physical Exam Constitutional:      General: She is not in acute distress. HENT:     Head: Normocephalic and atraumatic.  Eyes:     Extraocular Movements: Extraocular movements intact.     Conjunctiva/sclera: Conjunctivae normal.  Cardiovascular:     Rate and Rhythm: Normal rate and regular rhythm.     Pulses: Normal pulses.  Pulmonary:     Effort: Pulmonary effort is normal. No respiratory distress.     Breath sounds: Normal breath sounds. No wheezing or rales.     Comments: Satting 97-98% on room air Abdominal:     General: Bowel sounds are normal. There is no distension.     Palpations: Abdomen is soft.     Tenderness: There is no abdominal tenderness.  Musculoskeletal:        General: No swelling or tenderness.     Cervical back: Normal  range of motion and neck supple.  Skin:     General: Skin is warm and dry.  Neurological:     General: No focal deficit present.     Mental Status: She is alert and oriented to person, place, and time.     Cranial Nerves: No cranial nerve deficit.     Sensory: No sensory deficit.     Motor: No weakness.     Labs on Admission: I have personally reviewed following labs and imaging studies  CBC: Recent Labs  Lab 03/04/21 1128  WBC 17.5*  NEUTROABS 14.6*  HGB 11.5*  HCT 33.7*  MCV 88.5  PLT 99991111   Basic Metabolic Panel: Recent Labs  Lab 03/04/21 1128  NA 132*  K 4.0  CL 93*  CO2 22  GLUCOSE 328*  BUN 34*  CREATININE 7.91*  CALCIUM 8.7*   GFR: Estimated Creatinine Clearance: 9.3 mL/min (A) (by C-G formula based on SCr of 7.91 mg/dL (H)). Liver Function Tests: No results for input(s): AST, ALT, ALKPHOS, BILITOT, PROT, ALBUMIN in the last 168 hours. No results for input(s): LIPASE, AMYLASE in the last 168 hours. No results for input(s): AMMONIA in the last 168 hours. Coagulation Profile: No results for input(s): INR, PROTIME in the last 168 hours. Cardiac Enzymes: No results for input(s): CKTOTAL, CKMB, CKMBINDEX, TROPONINI in the last 168 hours. BNP (last 3 results) No results for input(s): PROBNP in the last 8760 hours. HbA1C: No results for input(s): HGBA1C in the last 72 hours. CBG: No results for input(s): GLUCAP in the last 168 hours. Lipid Profile: No results for input(s): CHOL, HDL, LDLCALC, TRIG, CHOLHDL, LDLDIRECT in the last 72 hours. Thyroid Function Tests: No results for input(s): TSH, T4TOTAL, FREET4, T3FREE, THYROIDAB in the last 72 hours. Anemia Panel: No results for input(s): VITAMINB12, FOLATE, FERRITIN, TIBC, IRON, RETICCTPCT in the last 72 hours. Urine analysis:    Component Value Date/Time   COLORURINE AMBER (A) 05/18/2020 1727   APPEARANCEUR CLOUDY (A) 05/18/2020 1727   LABSPEC 1.015 05/18/2020 1727   PHURINE 6.0 05/18/2020 1727   GLUCOSEU 150 (A) 05/18/2020 1727   HGBUR NEGATIVE  05/18/2020 1727   BILIRUBINUR NEGATIVE 05/18/2020 1727   KETONESUR NEGATIVE 05/18/2020 1727   PROTEINUR 100 (A) 05/18/2020 1727   NITRITE NEGATIVE 05/18/2020 1727   LEUKOCYTESUR LARGE (A) 05/18/2020 1727    Radiological Exams on Admission: CT HEAD WO CONTRAST (5MM)  Result Date: 03/04/2021 CLINICAL DATA:  Cerebral hemorrhage suspected. Weakness during dialysis EXAM: CT HEAD WITHOUT CONTRAST TECHNIQUE: Contiguous axial images were obtained from the base of the skull through the vertex without intravenous contrast. COMPARISON:  11/10/2017 FINDINGS: Brain: No evidence of acute infarction, hemorrhage, hydrocephalus, extra-axial collection or mass lesion/mass effect. Vascular: No hyperdense vessel or unexpected calcification. Skull: Normal. Negative for fracture or focal lesion. Sinuses/Orbits: No acute finding. Other: None. IMPRESSION: No acute intracranial findings. Electronically Signed   By: Davina Poke D.O.   On: 03/04/2021 16:00   DG Chest Port 1 View  Result Date: 03/04/2021 CLINICAL DATA:  Shortness of breath for 1 week. History of hypotension. EXAM: PORTABLE CHEST 1 VIEW COMPARISON:  Radiographs 02/25/2021 and 10/11/2020. FINDINGS: 1155 hours. Left IJ hemodialysis catheter projects at the level of the mid right atrium. The heart size and mediastinal contours are stable. There is chronic lung disease with parenchymal scarring bilaterally, right greater than left. Interval increased density peripherally in the mid right lung which could reflect atelectasis or a focal infiltrate. There is no overt pulmonary edema,  significant pleural effusion or pneumothorax. The bones appear unchanged. Multiple telemetry leads overlie the chest. IMPRESSION: 1. Interval increased focal density peripherally in the mid right lung which could reflect atelectasis or a focal infiltrate. Recommend short-term radiographic follow-up. 2. Otherwise stable chest with bilateral parenchymal scarring. Hemodialysis catheter  appears unchanged. Electronically Signed   By: Richardean Sale M.D.   On: 03/04/2021 12:22    EKG: Independently reviewed.  Sinus rhythm, occasional PVCs.  Nonspecific T wave abnormality similar to prior tracings.  No acute ischemic changes.  Assessment/Plan Principal Problem:   Syncope Active Problems:   Diabetes mellitus type 2 in obese (HCC)   Hypothyroidism   OSA (obstructive sleep apnea)   CAP (community acquired pneumonia)   ?Seizure vs syncope Patient sent to the ED from her dialysis center due to concern for seizure versus syncope.  She was noted to be hypotensive with systolic in the 123XX123 and blood pressure improved after a small 250 cc fluid bolus.  Does have signs of pneumonia on chest x-ray and leukocytosis on labs, however, no other signs of sepsis such as tachycardia or fever.  Does have chronic hypotension for which she was prescribed midodrine but takes it only as needed.  CT head negative for acute findings.  No focal neurodeficit on exam.  ?ACS given slightly elevated troponin but currently not having any chest pain. -Cardiac monitoring -Echo -EEG -Lactic acid level -Continue midodrine 10 mg 3 times daily  Elevated troponin Patient had an episode of chest tightness at dialysis which has now resolved.  Does endorse prior episodes of chest tightness with exertion but symptoms always resolved when she used her home inhaler.  Initial high-sensitivity troponin 30, repeat 56.  EKG without acute ischemic changes.  Currently chest pain-free and appears comfortable.  LHC done in 2017 showing normal coronaries but no recent testing done. -Cardiac monitoring -Full dose aspirin administered by EMS -Check third set of troponin, if it continues to trend up, start heparin for possible NSTEMI and consult cardiology. -Echocardiogram ordered  CAP Patient is endorsing cough.  Chest x-ray showing density peripherally in the mid right lung suspicious for focal infiltrate.  Does have  leukocytosis on labs.  She was hypotensive but otherwise no other signs of sepsis such as fever or tachycardia.  Currently satting well on room air at rest. -Ceftriaxone and azithromycin -Lactic acid level -Blood culture x2 -Monitor WBC count -COVID and influenza PCR pending but she is vaccinated  Chronic hypoxemic respiratory failure Uses 5 L home oxygen with exertion.  Currently satting well on room air at rest. -Continue home oxygen -Continuous pulse ox  ESRD on HD Underwent dialysis today but could not complete the session due to hypotension.  No signs of volume overload.  Potassium and bicarb normal. -Consult nephrology during daytime.  Chronic anemia Likely due to chronic kidney disease.  Hemoglobin stable.  Hyperglycemia Poorly controlled insulin-dependent type 2 diabetes Random blood glucose 328.  No metabolic acidosis to suggest DKA.  A1c 10.4 on 12/25/2020. -Sliding scale insulin very sensitive ACHS.  Resume home basal insulin after pharmacy med rec is done.  Consult diabetes coordinator.  Hypothyroidism -Resume Synthroid after pharmacy med rec is done  Chronic pain syndrome -Resume home meds after pharmacy med rec is done  Asthma Stable.  No wheezing or bronchospasm. -Resume home meds after pharmacy med rec is done.  OSA -Continue nightly CPAP  DVT prophylaxis: Subcutaneous heparin Code Status: Full code Family Communication: No family available at this time. Disposition Plan:  Status is: Observation  The patient remains OBS appropriate and will d/c before 2 midnights.  Dispo: The patient is from: Home              Anticipated d/c is to: Home              Patient currently is not medically stable to d/c.   Difficult to place patient No  Level of care: Level of care: Telemetry Cardiac  The medical decision making on this patient was of high complexity and the patient is at high risk for clinical deterioration, therefore this is a level 3 visit.  Wanda Leff MD [facility]  If 7PM-7AM, please contact night-coverage [email or web address]  03/04/2021, 9:57 PM

## 2021-03-05 ENCOUNTER — Observation Stay (HOSPITAL_BASED_OUTPATIENT_CLINIC_OR_DEPARTMENT_OTHER): Payer: Medicaid Other

## 2021-03-05 ENCOUNTER — Observation Stay (HOSPITAL_COMMUNITY): Payer: Medicaid Other

## 2021-03-05 ENCOUNTER — Other Ambulatory Visit: Payer: Self-pay

## 2021-03-05 DIAGNOSIS — E785 Hyperlipidemia, unspecified: Secondary | ICD-10-CM | POA: Diagnosis present

## 2021-03-05 DIAGNOSIS — Z9981 Dependence on supplemental oxygen: Secondary | ICD-10-CM | POA: Diagnosis not present

## 2021-03-05 DIAGNOSIS — G894 Chronic pain syndrome: Secondary | ICD-10-CM | POA: Diagnosis present

## 2021-03-05 DIAGNOSIS — J45909 Unspecified asthma, uncomplicated: Secondary | ICD-10-CM | POA: Diagnosis present

## 2021-03-05 DIAGNOSIS — J9611 Chronic respiratory failure with hypoxia: Secondary | ICD-10-CM | POA: Diagnosis present

## 2021-03-05 DIAGNOSIS — E669 Obesity, unspecified: Secondary | ICD-10-CM | POA: Diagnosis present

## 2021-03-05 DIAGNOSIS — Z20822 Contact with and (suspected) exposure to covid-19: Secondary | ICD-10-CM | POA: Diagnosis present

## 2021-03-05 DIAGNOSIS — Z992 Dependence on renal dialysis: Secondary | ICD-10-CM | POA: Diagnosis not present

## 2021-03-05 DIAGNOSIS — J189 Pneumonia, unspecified organism: Secondary | ICD-10-CM | POA: Diagnosis present

## 2021-03-05 DIAGNOSIS — R079 Chest pain, unspecified: Secondary | ICD-10-CM

## 2021-03-05 DIAGNOSIS — E8889 Other specified metabolic disorders: Secondary | ICD-10-CM | POA: Diagnosis present

## 2021-03-05 DIAGNOSIS — R569 Unspecified convulsions: Secondary | ICD-10-CM | POA: Diagnosis present

## 2021-03-05 DIAGNOSIS — R55 Syncope and collapse: Secondary | ICD-10-CM | POA: Diagnosis present

## 2021-03-05 DIAGNOSIS — E039 Hypothyroidism, unspecified: Secondary | ICD-10-CM | POA: Diagnosis present

## 2021-03-05 DIAGNOSIS — I248 Other forms of acute ischemic heart disease: Secondary | ICD-10-CM | POA: Diagnosis present

## 2021-03-05 DIAGNOSIS — H409 Unspecified glaucoma: Secondary | ICD-10-CM | POA: Diagnosis present

## 2021-03-05 DIAGNOSIS — I252 Old myocardial infarction: Secondary | ICD-10-CM | POA: Diagnosis not present

## 2021-03-05 DIAGNOSIS — E1142 Type 2 diabetes mellitus with diabetic polyneuropathy: Secondary | ICD-10-CM | POA: Diagnosis present

## 2021-03-05 DIAGNOSIS — G4733 Obstructive sleep apnea (adult) (pediatric): Secondary | ICD-10-CM | POA: Diagnosis present

## 2021-03-05 DIAGNOSIS — E1122 Type 2 diabetes mellitus with diabetic chronic kidney disease: Secondary | ICD-10-CM | POA: Diagnosis present

## 2021-03-05 DIAGNOSIS — I953 Hypotension of hemodialysis: Secondary | ICD-10-CM | POA: Diagnosis present

## 2021-03-05 DIAGNOSIS — D631 Anemia in chronic kidney disease: Secondary | ICD-10-CM | POA: Diagnosis present

## 2021-03-05 DIAGNOSIS — E1165 Type 2 diabetes mellitus with hyperglycemia: Secondary | ICD-10-CM | POA: Diagnosis present

## 2021-03-05 DIAGNOSIS — Z6839 Body mass index (BMI) 39.0-39.9, adult: Secondary | ICD-10-CM | POA: Diagnosis not present

## 2021-03-05 DIAGNOSIS — N186 End stage renal disease: Secondary | ICD-10-CM | POA: Diagnosis present

## 2021-03-05 DIAGNOSIS — Z794 Long term (current) use of insulin: Secondary | ICD-10-CM | POA: Diagnosis not present

## 2021-03-05 LAB — BASIC METABOLIC PANEL
Anion gap: 18 — ABNORMAL HIGH (ref 5–15)
BUN: 44 mg/dL — ABNORMAL HIGH (ref 6–20)
CO2: 22 mmol/L (ref 22–32)
Calcium: 9.2 mg/dL (ref 8.9–10.3)
Chloride: 94 mmol/L — ABNORMAL LOW (ref 98–111)
Creatinine, Ser: 10.06 mg/dL — ABNORMAL HIGH (ref 0.44–1.00)
GFR, Estimated: 4 mL/min — ABNORMAL LOW (ref >60)
Glucose, Bld: 287 mg/dL — ABNORMAL HIGH (ref 70–99)
Potassium: 3.9 mmol/L (ref 3.5–5.1)
Sodium: 134 mmol/L — ABNORMAL LOW (ref 135–145)

## 2021-03-05 LAB — RESP PANEL BY RT-PCR (FLU A&B, COVID) ARPGX2
Influenza A by PCR: NEGATIVE
Influenza B by PCR: NEGATIVE
SARS Coronavirus 2 by RT PCR: NEGATIVE

## 2021-03-05 LAB — ECHOCARDIOGRAM COMPLETE
Area-P 1/2: 3.77 cm2
Calc EF: 59.9 %
Height: 62 in
Single Plane A2C EF: 64.1 %
Single Plane A4C EF: 53.8 %
Weight: 3440 oz

## 2021-03-05 LAB — CBC
HCT: 34.1 % — ABNORMAL LOW (ref 36.0–46.0)
Hemoglobin: 11.4 g/dL — ABNORMAL LOW (ref 12.0–15.0)
MCH: 30.2 pg (ref 26.0–34.0)
MCHC: 33.4 g/dL (ref 30.0–36.0)
MCV: 90.5 fL (ref 80.0–100.0)
Platelets: 299 10*3/uL (ref 150–400)
RBC: 3.77 MIL/uL — ABNORMAL LOW (ref 3.87–5.11)
RDW: 18.8 % — ABNORMAL HIGH (ref 11.5–15.5)
WBC: 15 10*3/uL — ABNORMAL HIGH (ref 4.0–10.5)
nRBC: 0 % (ref 0.0–0.2)

## 2021-03-05 LAB — GLUCOSE, CAPILLARY
Glucose-Capillary: 175 mg/dL — ABNORMAL HIGH (ref 70–99)
Glucose-Capillary: 243 mg/dL — ABNORMAL HIGH (ref 70–99)

## 2021-03-05 LAB — HIV ANTIBODY (ROUTINE TESTING W REFLEX): HIV Screen 4th Generation wRfx: NONREACTIVE

## 2021-03-05 LAB — CBG MONITORING, ED
Glucose-Capillary: 191 mg/dL — ABNORMAL HIGH (ref 70–99)
Glucose-Capillary: 281 mg/dL — ABNORMAL HIGH (ref 70–99)

## 2021-03-05 MED ORDER — GABAPENTIN 100 MG PO CAPS
100.0000 mg | ORAL_CAPSULE | Freq: Two times a day (BID) | ORAL | Status: DC
  Administered 2021-03-05 – 2021-03-06 (×3): 100 mg via ORAL
  Filled 2021-03-05 (×3): qty 1

## 2021-03-05 MED ORDER — BRINZOLAMIDE 1 % OP SUSP
1.0000 [drp] | Freq: Three times a day (TID) | OPHTHALMIC | Status: DC
  Administered 2021-03-05 – 2021-03-06 (×3): 1 [drp] via OPHTHALMIC
  Filled 2021-03-05: qty 10

## 2021-03-05 MED ORDER — CHLORHEXIDINE GLUCONATE CLOTH 2 % EX PADS
6.0000 | MEDICATED_PAD | Freq: Every day | CUTANEOUS | Status: DC
  Administered 2021-03-06: 6 via TOPICAL

## 2021-03-05 MED ORDER — ALBUTEROL SULFATE (2.5 MG/3ML) 0.083% IN NEBU: 3.0000 mL | INHALATION_SOLUTION | Freq: Four times a day (QID) | RESPIRATORY_TRACT | Status: DC | PRN

## 2021-03-05 MED ORDER — ACETAMINOPHEN 325 MG PO TABS: 325.0000 mg | ORAL_TABLET | ORAL | Status: DC | PRN

## 2021-03-05 MED ORDER — BRIMONIDINE TARTRATE 0.2 % OP SOLN
1.0000 [drp] | Freq: Three times a day (TID) | OPHTHALMIC | Status: DC
  Administered 2021-03-05 – 2021-03-06 (×3): 1 [drp] via OPHTHALMIC
  Filled 2021-03-05: qty 5

## 2021-03-05 MED ORDER — OXYCODONE HCL 5 MG PO TABS
10.0000 mg | ORAL_TABLET | Freq: Four times a day (QID) | ORAL | Status: DC | PRN
Start: 2021-03-05 — End: 2021-03-07
  Administered 2021-03-05: 10 mg via ORAL
  Filled 2021-03-05: qty 2

## 2021-03-05 MED ORDER — LEVOTHYROXINE SODIUM 25 MCG PO TABS
125.0000 ug | ORAL_TABLET | Freq: Every day | ORAL | Status: DC
  Administered 2021-03-06: 125 ug via ORAL
  Filled 2021-03-05: qty 1

## 2021-03-05 MED ORDER — B COMPLEX-C PO TABS
1.0000 | ORAL_TABLET | Freq: Every morning | ORAL | Status: DC
  Administered 2021-03-06: 1 via ORAL
  Filled 2021-03-05 (×2): qty 1

## 2021-03-05 MED ORDER — FLUTICASONE FUROATE-VILANTEROL 100-25 MCG/INH IN AEPB
1.0000 | INHALATION_SPRAY | Freq: Every morning | RESPIRATORY_TRACT | Status: DC
  Administered 2021-03-06: 1 via RESPIRATORY_TRACT
  Filled 2021-03-05: qty 28

## 2021-03-05 MED ORDER — NETARSUDIL DIMESYLATE 0.02 % OP SOLN: 1.0000 [drp] | Freq: Every day | OPHTHALMIC | Status: DC

## 2021-03-05 MED ORDER — ALPRAZOLAM 0.5 MG PO TABS: 0.2500 mg | ORAL_TABLET | Freq: Four times a day (QID) | ORAL | Status: DC | PRN

## 2021-03-05 MED ORDER — PANTOPRAZOLE SODIUM 40 MG PO TBEC
40.0000 mg | DELAYED_RELEASE_TABLET | Freq: Every morning | ORAL | Status: DC
  Administered 2021-03-06: 40 mg via ORAL
  Filled 2021-03-05: qty 1

## 2021-03-05 MED ORDER — FERRIC CITRATE 1 GM 210 MG(FE) PO TABS
630.0000 mg | ORAL_TABLET | Freq: Three times a day (TID) | ORAL | Status: DC
  Administered 2021-03-05 – 2021-03-06 (×4): 630 mg via ORAL
  Filled 2021-03-05 (×4): qty 3

## 2021-03-05 MED ORDER — ASPIRIN EC 81 MG PO TBEC
81.0000 mg | DELAYED_RELEASE_TABLET | Freq: Every morning | ORAL | Status: DC
  Administered 2021-03-06: 81 mg via ORAL
  Filled 2021-03-05: qty 1

## 2021-03-05 MED ORDER — INSULIN GLARGINE-YFGN 100 UNIT/ML ~~LOC~~ SOLN
8.0000 [IU] | Freq: Every day | SUBCUTANEOUS | Status: DC
  Administered 2021-03-05: 8 [IU] via SUBCUTANEOUS
  Filled 2021-03-05 (×3): qty 0.08

## 2021-03-05 MED ORDER — QUETIAPINE FUMARATE 50 MG PO TABS
50.0000 mg | ORAL_TABLET | Freq: Every day | ORAL | Status: DC
  Administered 2021-03-05: 50 mg via ORAL
  Filled 2021-03-05: qty 1

## 2021-03-05 MED ORDER — MONTELUKAST SODIUM 10 MG PO TABS
10.0000 mg | ORAL_TABLET | Freq: Every morning | ORAL | Status: DC
  Administered 2021-03-06: 10 mg via ORAL

## 2021-03-05 MED ORDER — CINACALCET HCL 30 MG PO TABS
30.0000 mg | ORAL_TABLET | Freq: Every day | ORAL | Status: DC
  Administered 2021-03-05 – 2021-03-06 (×2): 30 mg via ORAL
  Filled 2021-03-05 (×2): qty 1

## 2021-03-05 NOTE — ED Notes (Signed)
Received verbal report from Alberto C RN at this time 

## 2021-03-05 NOTE — Progress Notes (Signed)
EEG complete - results pending 

## 2021-03-05 NOTE — Progress Notes (Signed)
RT set up cpap for pt, pt stated she could place herself on machine when ready for bed.

## 2021-03-05 NOTE — Progress Notes (Signed)
PROGRESS NOTE    Wanda Ramirez  [id number] DOB: 1970-12-27 DOA: 03/04/2021 PCP: Darrol Jump, NP   Chief Complaint  Patient presents with   Hypotension   Weakness   Brief Narrative/Hospital Course: 50 year old female ESRD on HD MWF, chronic hypotension on midodrine, anemia, IDDM, hypothyroidism, HLD, chronic pain syndrome, asthma, chronic hypoxia OSA on CPAP class II obesity sent from dialysis center due to low blood pressure and concern for possible seizure or syncopal episode. Patient got her dialysis 2-hour 45 minutes out of 4-hour and was noted to be hypotensive with systolic blood pressure in 80s, resolved with 250 mill bolus, staff are concerned that she might have had seizure as her eyes were glassy, patient is not sure if she passed out but she is able to recollect the events before and after.  She had no chest pain shortness of breath.She was seen in the ED monitor leukocytosis chest x-ray is showing evidence of pneumonia in the mid right lung CT head no acute finding, she was admitted for further management  Subjective: Seen and examined this morning.  She is alert awake oriented has no complaints.  EEG is about to be started. Blood pressure has improved.  Assessment & Plan:  Episode of hypotension with questionable seizure versus syncope: Unclear about the events, here EEG no acute finding, CT head no abnormalities, question if its due to symptomatic hypotension during HD.  Follow-up echocardiogram, monitor telemetry, seizure precaution, continue midodrine.  PT OT evaluation  Elevated troponin 30>56>41>39, without chest pain: Suspect demand mismatch LHC in 2017 showed normal coronaries, continue aspirin follow-up echocardiogram.  If chest pain or abnormal echo consider cardiology consult  Community-acquired pneumonia, patient having cough, chest x-ray the mid right lung suspicious for focal infiltrate, with leukocytosis.  Continue ceftriaxone azithromycin.  No lactic  acidosis.  Follow-up blood culture.  ESRD on HD MWF I have alerted nephrology.  Did not complete her dialysis yesterday, currently no significant fluid overload or hyperkalemia.  Continue iron supplementation, sensipar.  Chronic hypoxic respiratory failure uses 5 L nasal cannula at home with exertion OSA on CPAP: Continue her inhaler Singulair, Breo  Chronic anemia of renal disease monitor H&H.  Continue iron supplements  Diabetes mellitus type 2 in obese with poorly controlled hyperglycemia and Poorly controlled diabetes HbA1c 10.2 back in July, continue sliding scale insulin, resume home Lantus 8 units at bedtime hold Premeal insulin  Recent Labs  Lab 03/05/21 0750 03/05/21 1127  GLUCAP 191* 281*    Hypothyroidism-cont synthroid  Obesity Class II, Patient's Body mass index is 39.32 kg/m. : Will benefit with PCP follow-up, weight loss healthy lifestyle and outpatient sleep evaluation  FEN: Diet Order             Diet renal/carb modified with fluid restriction Diet-HS Snack? Nothing; Fluid restriction: 1200 mL Fluid; Room service appropriate? Yes; Fluid consistency: Thin  Diet effective now                 DVT prophylaxis: heparin injection 5,000 Units Start: 03/04/21 2215 Code Status:   Code Status: Full Code  Family Communication: plan of care discussed with patient at bedside. Status is: Observation Admitted as observation Patient will need at least 2 midnight stay for management of pneumonia, hypotension and further work-up of hypotension Dispo: The patient is from: Home              Anticipated d/c is to: Home  Patient currently is not medically stable to d/c.   Difficult to place patient No       Objective: Vitals: Today's Vitals   03/05/21 1145 03/05/21 1200 03/05/21 1230 03/05/21 1245  BP: (!) 154/84 (!) 146/88 (!) 175/123   Pulse: 89 78 87 75  Resp: '19 20 16 18  '$ Temp:      TempSrc:      SpO2: 94% 96% 96% 95%  Weight:      Height:       PainSc:       Physical Examination: General exam: AA0X3 Obese,weak,older than stated age. HEENT:Oral mucosa moist, Ear/Nose WNL grossly,dentition normal. Respiratory system: B/l clear BS, no use of accessory muscle, non tender. Cardiovascular system: S1 & S2 +,No JVD. Gastrointestinal system: Abdomen soft, NT,ND, BS+. Nervous System:Alert, awake, moving extremities. Extremities: Leg edema none, distal peripheral pulses palpable.  Skin: No rashes, no icterus. MSK: Normal muscle bulk,tone, power.  Medications reviewed:  Scheduled Meds:  heparin  5,000 Units Subcutaneous Q8H   insulin aspart  0-5 Units Subcutaneous QHS   insulin aspart  0-6 Units Subcutaneous TID WC   midodrine  10 mg Oral TID WC   Continuous Infusions:  azithromycin Stopped (03/05/21 0340)   cefTRIAXone (ROCEPHIN)  IV      Intake/Output  Intake/Output Summary (Last 24 hours) at 03/05/2021 1256 Last data filed at 03/05/2021 0340 Gross per 24 hour  Intake 253.3 ml  Output --  Net 253.3 ml   Intake/Output from previous day: 10/03 0701 - 10/04 0700 In: 253.3 [IV Piggyback:253.3] Out: -  Net IO Since Admission: 253.3 mL [03/05/21 1256]   Weight change:   Wt Readings from Last 3 Encounters:  03/04/21 97.5 kg  01/23/21 96.2 kg  01/17/21 96.2 kg     Consultants:see note  Procedures:see note Antimicrobials: Anti-infectives (From admission, onward)    Start     Dose/Rate Route Frequency Ordered Stop   03/05/21 2000  cefTRIAXone (ROCEPHIN) 1 g in sodium chloride 0.9 % 100 mL IVPB        1 g 200 mL/hr over 30 Minutes Intravenous Every 24 hours 03/04/21 2206     03/04/21 2045  cefTRIAXone (ROCEPHIN) 1 g in sodium chloride 0.9 % 100 mL IVPB        1 g 200 mL/hr over 30 Minutes Intravenous  Once 03/04/21 2033 03/05/21 0109   03/04/21 2045  azithromycin (ZITHROMAX) 500 mg in sodium chloride 0.9 % 250 mL IVPB        500 mg 250 mL/hr over 60 Minutes Intravenous Every 24 hours 03/04/21 2033     03/04/21 0000   cefUROXime (CEFTIN) 250 MG tablet        250 mg Oral Every 24 hours 03/04/21 1856 03/09/21 2359   03/04/21 0000  doxycycline (VIBRAMYCIN) 100 MG capsule        100 mg Oral 2 times daily 03/04/21 1856 03/09/21 2359      Culture/Microbiology    Component Value Date/Time   SDES BLOOD SITE NOT SPECIFIED 03/04/2021 2243   SPECREQUEST  03/04/2021 2243    BOTTLES DRAWN AEROBIC AND ANAEROBIC Blood Culture adequate volume   CULT  03/04/2021 2243    NO GROWTH < 12 HOURS Performed at [facility], [place] [address] [place], [place] [address]    REPTSTATUS PENDING 03/04/2021 2243    Other culture-see note  Unresulted Labs (From admission, onward)     Start     Ordered   03/04/21 2150  Culture, blood (routine x 2)  BLOOD CULTURE X 2,   R (with TIMED occurrences)      03/04/21 2149          Data Reviewed: I have personally reviewed following labs and imaging studies CBC: Recent Labs  Lab 03/04/21 1128 03/05/21 0500  WBC 17.5* 15.0*  NEUTROABS 14.6*  --   HGB 11.5* 11.4*  HCT 33.7* 34.1*  MCV 88.5 90.5  PLT 271 [id number]   Basic Metabolic Panel: Recent Labs  Lab 03/04/21 1128 03/05/21 0500  NA 132* 134*  K 4.0 3.9  CL 93* 94*  CO2 22 22  GLUCOSE 328* 287*  BUN 34* 44*  CREATININE 7.91* 10.06*  CALCIUM 8.7* 9.2   GFR: Estimated Creatinine Clearance: 7.3 mL/min (A) (by C-G formula based on SCr of 10.06 mg/dL (H)). Liver Function Tests: No results for input(s): AST, ALT, ALKPHOS, BILITOT, PROT, ALBUMIN in the last 168 hours. No results for input(s): LIPASE, AMYLASE in the last 168 hours. No results for input(s): AMMONIA in the last 168 hours. Coagulation Profile: No results for input(s): INR, PROTIME in the last 168 hours. Cardiac Enzymes: No results for input(s): CKTOTAL, CKMB, CKMBINDEX, TROPONINI in the last 168 hours. BNP (last 3 results) No results for input(s): PROBNP in the last 8760 hours. HbA1C: No results for input(s): HGBA1C in the last 72  hours. CBG: Recent Labs  Lab 03/05/21 0750 03/05/21 1127  GLUCAP 191* 281*   Lipid Profile: No results for input(s): CHOL, HDL, LDLCALC, TRIG, CHOLHDL, LDLDIRECT in the last 72 hours. Thyroid Function Tests: No results for input(s): TSH, T4TOTAL, FREET4, T3FREE, THYROIDAB in the last 72 hours. Anemia Panel: No results for input(s): VITAMINB12, FOLATE, FERRITIN, TIBC, IRON, RETICCTPCT in the last 72 hours. Sepsis Labs: Recent Labs  Lab 03/04/21 2206  LATICACIDVEN 1.8    Recent Results (from the past 240 hour(s))  Culture, blood (routine x 2)     Status: None (Preliminary result)   Collection Time: 03/04/21 10:43 PM   Specimen: BLOOD  Result Value Ref Range Status   Specimen Description BLOOD SITE NOT SPECIFIED  Final   Special Requests   Final    BOTTLES DRAWN AEROBIC AND ANAEROBIC Blood Culture adequate volume   Culture   Final    NO GROWTH < 12 HOURS Performed at [facility], [place] [address], [place], [place] [address]    Report Status PENDING  Incomplete  Resp Panel by RT-PCR (Flu A&B, Covid) Nasopharyngeal Swab     Status: None   Collection Time: 03/04/21 11:07 PM   Specimen: Nasopharyngeal Swab; Nasopharyngeal(NP) swabs in vial transport medium  Result Value Ref Range Status   SARS Coronavirus 2 by RT PCR NEGATIVE NEGATIVE Final    Comment: (NOTE) SARS-CoV-2 target nucleic acids are NOT DETECTED.  The SARS-CoV-2 RNA is generally detectable in upper respiratory specimens during the acute phase of infection. The lowest concentration of SARS-CoV-2 viral copies this assay can detect is 138 copies/mL. A negative result does not preclude SARS-Cov-2 infection and should not be used as the sole basis for treatment or other patient management decisions. A negative result may occur with  improper specimen collection/handling, submission of specimen other than nasopharyngeal swab, presence of viral mutation(s) within the areas targeted by this assay, and inadequate  number of viral copies(<138 copies/mL). A negative result must be combined with clinical observations, patient history, and epidemiological information. The expected result is Negative.  Fact Sheet for Patients:  EntrepreneurPulse.com.au  Fact Sheet  for Healthcare Providers:  IncredibleEmployment.be  This test is no t yet approved or cleared by the [place] and  has been authorized for detection and/or diagnosis of SARS-CoV-2 by FDA under an Emergency Use Authorization (EUA). This EUA will remain  in effect (meaning this test can be used) for the duration of the COVID-19 declaration under Section 564(b)(1) of the Act, 21 U.S.C.section 360bbb-3(b)(1), unless the authorization is terminated  or revoked sooner.       Influenza A by PCR NEGATIVE NEGATIVE Final   Influenza B by PCR NEGATIVE NEGATIVE Final    Comment: (NOTE) The Xpert Xpress SARS-CoV-2/FLU/RSV plus assay is intended as an aid in the diagnosis of influenza from Nasopharyngeal swab specimens and should not be used as a sole basis for treatment. Nasal washings and aspirates are unacceptable for Xpert Xpress SARS-CoV-2/FLU/RSV testing.  Fact Sheet for Patients: EntrepreneurPulse.com.au  Fact Sheet for Healthcare Providers: IncredibleEmployment.be  This test is not yet approved or cleared by the [place] FDA and has been authorized for detection and/or diagnosis of SARS-CoV-2 by FDA under an Emergency Use Authorization (EUA). This EUA will remain in effect (meaning this test can be used) for the duration of the COVID-19 declaration under Section 564(b)(1) of the Act, 21 U.S.C. section 360bbb-3(b)(1), unless the authorization is terminated or revoked.  Performed at [facility], [place] [address] [place], [place] [address]      Radiology Studies: CT HEAD WO CONTRAST (5MM)  Result Date: 03/04/2021 CLINICAL DATA:  Cerebral  hemorrhage suspected. Weakness during dialysis EXAM: CT HEAD WITHOUT CONTRAST TECHNIQUE: Contiguous axial images were obtained from the base of the skull through the vertex without intravenous contrast. COMPARISON:  11/10/2017 FINDINGS: Brain: No evidence of acute infarction, hemorrhage, hydrocephalus, extra-axial collection or mass lesion/mass effect. Vascular: No hyperdense vessel or unexpected calcification. Skull: Normal. Negative for fracture or focal lesion. Sinuses/Orbits: No acute finding. Other: None. IMPRESSION: No acute intracranial findings. Electronically Signed   By: Davina Poke D.O.   On: 03/04/2021 16:00   DG Chest Port 1 View  Result Date: 03/04/2021 CLINICAL DATA:  Shortness of breath for 1 week. History of hypotension. EXAM: PORTABLE CHEST 1 VIEW COMPARISON:  Radiographs 02/25/2021 and 10/11/2020. FINDINGS: 1155 hours. Left IJ hemodialysis catheter projects at the level of the mid right atrium. The heart size and mediastinal contours are stable. There is chronic lung disease with parenchymal scarring bilaterally, right greater than left. Interval increased density peripherally in the mid right lung which could reflect atelectasis or a focal infiltrate. There is no overt pulmonary edema, significant pleural effusion or pneumothorax. The bones appear unchanged. Multiple telemetry leads overlie the chest. IMPRESSION: 1. Interval increased focal density peripherally in the mid right lung which could reflect atelectasis or a focal infiltrate. Recommend short-term radiographic follow-up. 2. Otherwise stable chest with bilateral parenchymal scarring. Hemodialysis catheter appears unchanged. Electronically Signed   By: Richardean Sale M.D.   On: 03/04/2021 12:22   EEG adult  Result Date: 03/05/2021 Lora Havens, MD     03/05/2021 12:23 PM Patient Name: Wanda Ramirez MRN: [id number] Epilepsy Attending: Lora Havens Referring Physician/Provider: Dr Derrick Ravel Date: 03/05/2021  Duration: 22.36 mins Patient history: 49 year old female who was at dialysis when her blood pressure dropped and patient had an episode concerning for syncope versus seizure.  EEG to evaluate for seizures. Level of alertness: Awake, asleep AEDs during EEG study: None Technical aspects: This EEG study was done with scalp electrodes positioned according  to the 10-20 International system of electrode placement. Electrical activity was acquired at a sampling rate of '500Hz'$  and reviewed with a high frequency filter of '70Hz'$  and a low frequency filter of '1Hz'$ . EEG data were recorded continuously and digitally stored. Description: The posterior dominant rhythm consists of 9-10 Hz activity of moderate voltage (25-35 uV) seen predominantly in posterior head regions, symmetric and reactive to eye opening and eye closing. Sleep was characterized by vertex waves, sleep spindles (12 to 14 Hz), maximal frontocentral region. Hyperventilation and photic stimulation were not performed.   IMPRESSION: This study is within normal limits. No seizures or epileptiform discharges were seen throughout the recording. Lora Havens   ECHOCARDIOGRAM COMPLETE  Result Date: 03/05/2021    ECHOCARDIOGRAM REPORT   Patient Name:   Wanda Ramirez Date of Exam: 03/05/2021 Medical Rec #:  [id number]        Height:       62.0 in Accession #:    [id number]       Weight:       215.0 lb Date of Birth:  Nov 08, 1970         BSA:          1.971 m Patient Age:    74 years         BP:           130/75 mmHg Patient Gender: F                HR:           78 bpm. Exam Location:  Inpatient Procedure: 2D Echo, 3D Echo, Cardiac Doppler, Color Doppler and Strain Analysis Indications:    R07.9* Chest pain, unspecified; R55 Syncope  History:        Patient has prior history of Echocardiogram examinations, most                 recent 02/29/2020. Previous Myocardial Infarction,                 Signs/Symptoms:Syncope, Chest Pain, Hypotension and Fever; Risk                  Factors:Diabetes, Sleep Apnea and Dyslipidemia. ESRD. Septic                 shock.  Sonographer:    Roseanna Rainbow RDCS Referring Phys: [id number] [facility]  Sonographer Comments: Technically difficult study due to poor echo windows and no parasternal window. Global longitudinal strain was attempted. Dailysis catheter in parasternal. IMPRESSIONS  1. Consider contrast study or MRI to further evaluate LV apex for non compaction or thormbus.  2. Poor image quality no para sternal views.  3. Prominent LV apical trabeculations doubt thrombus basal inferior wall hypokinesis . Left ventricular ejection fraction, by estimation, is 50 to 55%. The left ventricle has low normal function. The left ventricle demonstrates regional wall motion abnormalities (see scoring diagram/findings for description). There is mild left ventricular hypertrophy. Left ventricular diastolic parameters were normal. The average left ventricular global longitudinal strain is -18.4 %. The global longitudinal strain is normal.  4. Right ventricular systolic function is mildly reduced. The right ventricular size is mildly enlarged.  5. Left atrial size was mildly dilated.  6. The mitral valve is abnormal. Trivial mitral valve regurgitation.  7. The aortic valve was not well visualized. Aortic valve regurgitation is not visualized. Mild to moderate aortic valve sclerosis/calcification is present, without any evidence of aortic stenosis. FINDINGS  Left Ventricle: Prominent  LV apical trabeculations doubt thrombus basal inferior wall hypokinesis. Left ventricular ejection fraction, by estimation, is 50 to 55%. The left ventricle has low normal function. The left ventricle demonstrates regional wall  motion abnormalities. The average left ventricular global longitudinal strain is -18.4 %. The global longitudinal strain is normal. The left ventricular internal cavity size was normal in size. There is mild left ventricular hypertrophy. Left ventricular  diastolic parameters were normal. Right Ventricle: The right ventricular size is mildly enlarged. Right vetricular wall thickness was not assessed. Right ventricular systolic function is mildly reduced. Left Atrium: Left atrial size was mildly dilated. Right Atrium: Right atrial size was not assessed. Pericardium: There is no evidence of pericardial effusion. Mitral Valve: The mitral valve is abnormal. There is mild thickening of the mitral valve leaflet(s). Trivial mitral valve regurgitation. Tricuspid Valve: The tricuspid valve is not well visualized. Tricuspid valve regurgitation is mild. Aortic Valve: The aortic valve was not well visualized. Aortic valve regurgitation is not visualized. Mild to moderate aortic valve sclerosis/calcification is present, without any evidence of aortic stenosis. Pulmonic Valve: The pulmonic valve was not assessed. Pulmonic valve regurgitation is not visualized. Aorta: Aortic root could not be assessed. IAS/Shunts: No atrial level shunt detected by color flow Doppler. Additional Comments: Consider contrast study or MRI to further evaluate LV apex for non compaction or thormbus. Poor image quality no para sternal views.  LEFT VENTRICLE PLAX 2D LVOT diam:     1.90 cm     Diastology LV SV:         72          LV e' medial:    7.94 cm/s LV SV Index:   37          LV E/e' medial:  11.4 LVOT Area:     2.84 cm    LV e' lateral:   10.40 cm/s                            LV E/e' lateral: 8.7  LV Volumes (MOD)           2D Longitudinal Strain LV vol d, MOD A2C: 74.6 ml 2D Strain GLS Avg:     -18.4 % LV vol d, MOD A4C: 68.2 ml LV vol s, MOD A2C: 26.8 ml LV vol s, MOD A4C: 31.5 ml LV SV MOD A2C:     47.8 ml LV SV MOD A4C:     68.2 ml LV SV MOD BP:      43.8 ml RIGHT VENTRICLE             IVC RV S prime:     15.60 cm/s  IVC diam: 1.50 cm TAPSE (M-mode): 2.3 cm LEFT ATRIUM           Index      RIGHT ATRIUM           Index LA Vol (A2C): 18.9 ml 9.59 ml/m RA Area:     11.70 cm LA Vol (A4C): 15.8  ml 8.01 ml/m RA Volume:   26.60 ml  13.49 ml/m  AORTIC VALVE LVOT Vmax:   124.00 cm/s LVOT Vmean:  84.400 cm/s LVOT VTI:    0.255 m MITRAL VALVE MV Area (PHT): 3.77 cm    SHUNTS MV Decel Time: 201 msec    Systemic VTI:  0.26 m MV E velocity: 90.20 cm/s  Systemic Diam: 1.90 cm MV A velocity: 78.50 cm/s MV E/A ratio:  1.15 Jenkins Rouge MD Electronically signed by Jenkins Rouge MD Signature Date/Time: 03/05/2021/9:35:04 AM    Final      LOS: 0 days   Antonieta Pert, MD [facility]  03/05/2021, 12:56 PM

## 2021-03-05 NOTE — Progress Notes (Signed)
Echo in with patient at this time- will attempt EEG again as schedule permits

## 2021-03-05 NOTE — Procedures (Signed)
Patient Name: Wanda Ramirez  MRN: [id number]  Epilepsy Attending: Lora Havens  Referring Physician/Provider: Dr Derrick Ravel Date: 03/05/2021 Duration: 22.36 mins  Patient history: 50 year old female who was at dialysis when her blood pressure dropped and patient had an episode concerning for syncope versus seizure.  EEG to evaluate for seizures.  Level of alertness: Awake, asleep  AEDs during EEG study: None  Technical aspects: This EEG study was done with scalp electrodes positioned according to the 10-20 International system of electrode placement. Electrical activity was acquired at a sampling rate of '500Hz'$  and reviewed with a high frequency filter of '70Hz'$  and a low frequency filter of '1Hz'$ . EEG data were recorded continuously and digitally stored.   Description: The posterior dominant rhythm consists of 9-10 Hz activity of moderate voltage (25-35 uV) seen predominantly in posterior head regions, symmetric and reactive to eye opening and eye closing. Sleep was characterized by vertex waves, sleep spindles (12 to 14 Hz), maximal frontocentral region. Hyperventilation and photic stimulation were not performed.     IMPRESSION: This study is within normal limits. No seizures or epileptiform discharges were seen throughout the recording.  Bodhi Stenglein Barbra Sarks

## 2021-03-05 NOTE — Consult Note (Signed)
[facility] Renal Consultation Note    Indication for Consultation:  Management of ESRD/hemodialysis, anemia, hypertension/volume, and secondary hyperparathyroidism. PCP:  HPI: Wanda Ramirez is a 50 y.o. female with ESRD, T2DM, hypothyroidism, OSA, and asthma/on home O2 who was admitted with syncope v. seizure event.  Brought to ED via EMS from her outpatient HD unit on 10/3 with concern for possible seizure. Was on HD and developed hypotension and myoclonic jerking. Rinsed back and BP improved. BS was in 200's at the time.    In the ED - vitals were stable. Labs with K 3.9, Ca 9.2, WBC 15, Hgb 11.4, BS 287, Trop 30 -> 56 -> 41 -> 39. EKG without acute findings. Head CT without acute findings. CXR with possible RML pneumonia.  Dialyzes on MWF schedule at [facility] - completed most of her HD yesterday, no acute needs today -> will be due for HD tomorrow.  Seen in ED bed today, feels well. Denies CP, dyspnea, fever, chills, abdominal pain, dizziness.   Of note, she has been under extreme stress over the past week - her daughter and mother both died in a murder/suicide on 03/08/2021.  Past Medical History:  Diagnosis Date   Anemia    Anxiety    Arthritis    Asthma    Chronic back pain    Diabetes mellitus ([place])    Type II   Dyspnea    with exertion - 4L oxygen via [place]   ESRD (end stage renal disease) on dialysis ([place])    [facility] - [facility] since 2016   Essential hypertension    GERD (gastroesophageal reflux disease)    Glaucoma    History of blood transfusion    Hyperlipidemia    Hypothyroidism    Morbid obesity ([place])    Myocardial infarction ([place])    mild per patient   Peripheral neuropathy    left foot   Requires continuous at home supplemental oxygen    4L via [place]   Sleep apnea    wears CPAP, does not know setting   Past Surgical History:  Procedure Laterality Date   AV FISTULA PLACEMENT Left 04/2014   AV FISTULA PLACEMENT Right 08/15/2020    Procedure: RIGHT BRACHIOCEPHALIC ARTERIOVENOUS (AV) FISTULA CREATION;  Surgeon: Waynetta Sandy, MD;  Location: [place];  Service: Vascular;  Laterality: Right;   AV FISTULA PLACEMENT Right 01/23/2021   Procedure: INSERTION OF RIGHT UPPER ARM ARTERIOVENOUS (AV) GORE-TEX GRAFT, 4-7 mm x 45 cm STRETCH VASCULAR GRAFT;  Surgeon: Serafina Mitchell, MD;  Location: [place];  Service: Cardiovascular;  Laterality: Right;   CARDIAC CATHETERIZATION N/A 03/17/2016   Procedure: Left Heart Cath and Coronary Angiography;  Surgeon: [name], MD;  Location: [place] [facility];  Service: Cardiovascular;  Laterality: N/A;   INCISION AND DRAINAGE PERIRECTAL ABSCESS N/A 04/03/2020   Procedure: EXCISIONAL DEBRIDEMENT OF SACRAL AND GLUTEAL WOUNDS;  Surgeon: [name], MD;  Location: [place];  Service: General;  Laterality: N/A;   IR FLUORO GUIDE CV LINE LEFT  02/22/2020   IR FLUORO GUIDE CV LINE LEFT  02/27/2020   IR FLUORO GUIDE CV LINE LEFT  05/30/2020   IR FLUORO GUIDE CV LINE LEFT  10/10/2020   IR FLUORO GUIDE CV LINE LEFT  10/12/2020   IR FLUORO GUIDE CV LINE LEFT  01/17/2021   IR US GUIDE VASC ACCESS LEFT  02/22/2020   KNEE SURGERY     LIGATION OF COMPETING BRANCHES OF  ARTERIOVENOUS FISTULA Right 11/14/2020   Procedure: LIGATION OF COMPETING BRANCHES AND SUPERFICIALIZATION OF RIGHT ARTERIOVENOUS FISTULA;  Surgeon: Waynetta Sandy, MD;  Location: [place];  Service: Vascular;  Laterality: Right;   TUBAL LIGATION     UPPER EXTREMITY VENOGRAPHY Bilateral 01/14/2021   Procedure: UPPER EXTREMITY VENOGRAPHY;  Surgeon: Waynetta Sandy, MD;  Location: [place] [facility];  Service: Cardiovascular;  Laterality: Bilateral;   Family History  Problem Relation Age of Onset   Diabetes Mother    Hyperlipidemia Mother    Kidney disease Father    Social History:  reports that she has quit smoking. Her smoking use included cigarettes. She has a 12.50 pack-year smoking history. She has never  used smokeless tobacco. She reports that she does not drink alcohol and does not use drugs.  ROS: As per HPI otherwise negative.  Physical Exam: Vitals:   03/05/21 1300 03/05/21 1315 03/05/21 1330 03/05/21 1345  BP: (!) 147/74  (!) 133/109   Pulse: 75 73 73 73  Resp: '11 13 14 15  '$ Temp:      TempSrc:      SpO2: 94% 92% 93% 91%  Weight:      Height:         General: Well developed, well nourished, in no acute distress. Room air. Head: Normocephalic, atraumatic, sclera non-icteric, mucus membranes are moist. Neck: Supple without lymphadenopathy/masses. JVD not elevated. Lungs: Clear bilaterally to auscultation without wheezes, rales, or rhonchi. Breathing is unlabored. Heart: RRR with normal S1, S2. No murmurs, rubs, or gallops appreciated. Abdomen: Soft, non-tender, non-distended with normoactive bowel sounds. No rebound/guarding. No obvious abdominal masses. Musculoskeletal:  Strength and tone appear normal for age. Lower extremities: No edema or ischemic changes, no open wounds. Neuro: Alert and oriented X 3. Moves all extremities spontaneously. Psych:  Responds to questions appropriately with a normal affect. Dialysis Access: TDC in L chest and RUE AVG + bruit  Allergies  Allergen Reactions   Hydrocodone Itching and Nausea And Vomiting   Prior to Admission medications   Medication Sig Start Date End Date Taking? Authorizing Provider  acetaminophen (TYLENOL) 325 MG tablet Take 1-2 tablets (325-650 mg total) by mouth every 4 (four) hours as needed for mild pain. 05/29/20  Yes Love, Ivan Anchors, PA-C  albuterol (VENTOLIN HFA) 108 (90 Base) MCG/ACT inhaler Inhale 1-2 puffs into the lungs every 6 (six) hours as needed for wheezing or shortness of breath.   Yes [provider]  ALPRAZolam (XANAX) 0.25 MG tablet Take 0.25 mg by mouth every 6 (six) hours as needed. 02/25/21  Yes [provider]  aspirin EC 81 MG tablet Take 81 mg by mouth in the morning. Swallow whole.    Yes [provider]  AURYXIA 1 GM 210 MG(Fe) tablet Take 630 mg by mouth 3 (three) times daily with meals. 06/21/20  Yes [provider]  B Complex-C (B-COMPLEX WITH VITAMIN C) tablet Take 1 tablet by mouth daily. Patient taking differently: Take 1 tablet by mouth in the morning. 05/16/20  Yes Bloomfield, Carley D, DO  Brinzolamide-Brimonidine (SIMBRINZA) 1-0.2 % SUSP Place 1 drop into both eyes in the morning, at noon, and at bedtime.   Yes [provider]  cefUROXime (CEFTIN) 250 MG tablet Take 1 tablet (250 mg total) by mouth daily for 5 days. 03/04/21 03/09/21 Yes Dykstra, Ellwood Dense, MD  cinacalcet (SENSIPAR) 30 MG tablet Take 30 mg by mouth daily.   Yes [provider]  doxycycline (VIBRAMYCIN) 100 MG capsule  Take 1 capsule (100 mg total) by mouth 2 (two) times daily for 5 days. 03/04/21 03/09/21 Yes Dykstra, Ellwood Dense, MD  Dulaglutide (TRULICITY) A999333 [id number] SOPN Inject 0.75 mg into the skin every Sunday. 08/28/20  Yes [provider]  fluticasone furoate-vilanterol (BREO ELLIPTA) 100-25 MCG/INH AEPB Inhale 1 puff into the lungs in the morning.   Yes [provider]  gabapentin (NEURONTIN) 100 MG capsule Take 100 mg by mouth 2 (two) times daily.   Yes [provider]  insulin aspart (NOVOLOG FLEXPEN) 100 UNIT/ML FlexPen Inject 4 Units into the skin 3 (three) times daily with meals.   Yes [provider]  insulin glargine (LANTUS) 100 unit/mL SOPN Inject 4 Units into the skin at bedtime. Patient taking differently: Inject 8 Units into the skin at bedtime. 05/30/20  Yes Love, Ivan Anchors, PA-C  levothyroxine (SYNTHROID) 125 MCG tablet Take 1 tablet (125 mcg total) by mouth daily before breakfast. 05/29/20  Yes Love, Ivan Anchors, PA-C  lip balm (CARMEX) ointment Apply topically as needed for lip care. Patient taking differently: Apply 1 application topically as needed for lip care. 05/15/20  Yes Bloomfield, Carley D, DO  Methoxy  PEG-Epoetin Beta (MIRCERA IJ) Dialysis Monday,Wednesday and friday 01/16/21 01/15/22 Yes [provider]  midodrine (PROAMATINE) 10 MG tablet Take 1 tablet (10 mg total) by mouth 3 (three) times daily with meals. Patient taking differently: Take 10 mg by mouth 3 (three) times daily as needed (low blood pressure). 05/29/20  Yes Love, Ivan Anchors, PA-C  montelukast (SINGULAIR) 10 MG tablet Take 10 mg by mouth in the morning.   Yes [provider]  Netarsudil Dimesylate (RHOPRESSA) 0.02 % SOLN Place 1 drop into both eyes at bedtime.   Yes [provider]  Oxycodone HCl 10 MG TABS Take 10 mg by mouth 4 (four) times daily as needed (pain). 07/26/20  Yes [provider]  OXYGEN Inhale 5 L into the lungs continuous.   Yes [provider]  pantoprazole (PROTONIX) 40 MG tablet Take 1 tablet (40 mg total) by mouth daily. Patient taking differently: Take 40 mg by mouth in the morning. 05/29/20  Yes Love, Ivan Anchors, PA-C  QUEtiapine (SEROQUEL) 50 MG tablet Take 1 tablet (50 mg total) by mouth at bedtime. 05/29/20  Yes Love, Ivan Anchors, PA-C  oxyCODONE-acetaminophen (PERCOCET) 5-325 MG tablet Take 1 tablet by mouth every 6 (six) hours as needed for severe pain. Patient not taking: Reported on 03/04/2021 11/14/20 11/14/21  Dagoberto Ligas, PA-C   Current Facility-Administered Medications  Medication Dose Route Frequency Provider Last Rate Last Admin   acetaminophen (TYLENOL) tablet 325-650 mg  325-650 mg Oral Q4H PRN Kc, Ramesh, MD       albuterol (VENTOLIN HFA) 108 (90 Base) MCG/ACT inhaler 1-2 puff  1-2 puff Inhalation Q6H PRN Kc, Ramesh, MD       ALPRAZolam (XANAX) tablet 0.25 mg  0.25 mg Oral Q6H PRN Kc, Ramesh, MD       [START ON 03/06/2021] aspirin EC tablet 81 mg  81 mg Oral q AM Kc, Ramesh, MD       azithromycin (ZITHROMAX) 500 mg in sodium chloride 0.9 % 250 mL IVPB  500 mg Intravenous Q24H Shela Leff, MD   Stopped at 03/05/21 0340   [START ON 03/06/2021]  B-complex with vitamin C tablet 1 tablet  1 tablet Oral q AM Kc, Ramesh, MD       brinzolamide (AZOPT) 1 % ophthalmic suspension 1 drop  1 drop Both  Eyes TID Antonieta Pert, MD       And   brimonidine (ALPHAGAN) 0.2 % ophthalmic solution 1 drop  1 drop Both Eyes TID Kc, Ramesh, MD       cefTRIAXone (ROCEPHIN) 1 g in sodium chloride 0.9 % 100 mL IVPB  1 g Intravenous Q24H Shela Leff, MD       cinacalcet (SENSIPAR) tablet 30 mg  30 mg Oral Daily Kc, Ramesh, MD       ferric citrate (AURYXIA) tablet 630 mg  630 mg Oral TID WC Kc, Ramesh, MD       [START ON 03/06/2021] fluticasone furoate-vilanterol (BREO ELLIPTA) 100-25 MCG/INH 1 puff  1 puff Inhalation q AM Kc, Ramesh, MD       gabapentin (NEURONTIN) capsule 100 mg  100 mg Oral BID Kc, Ramesh, MD       heparin injection 5,000 Units  5,000 Units Subcutaneous Q8H Shela Leff, MD   5,000 Units at 03/05/21 0548   insulin aspart (novoLOG) injection 0-5 Units  0-5 Units Subcutaneous QHS Shela Leff, MD       insulin aspart (novoLOG) injection 0-6 Units  0-6 Units Subcutaneous TID WC Shela Leff, MD   3 Units at 03/05/21 1146   insulin glargine-yfgn (SEMGLEE) injection 8 Units  8 Units Subcutaneous QHS Kc, Maren Beach, MD       [START ON 03/06/2021] levothyroxine (SYNTHROID) tablet 125 mcg  125 mcg Oral QAC breakfast Kc, Ramesh, MD       midodrine (PROAMATINE) tablet 10 mg  10 mg Oral TID WC Shela Leff, MD   10 mg at 03/05/21 1125   [START ON 03/06/2021] montelukast (SINGULAIR) tablet 10 mg  10 mg Oral q AM Kc, Ramesh, MD       Netarsudil Dimesylate 0.02 % SOLN 1 drop  1 drop Both Eyes QHS Kc, Ramesh, MD       oxyCODONE (Oxy IR/ROXICODONE) immediate release tablet 10 mg  10 mg Oral QID PRN Kc, Maren Beach, MD       [START ON 03/06/2021] pantoprazole (PROTONIX) EC tablet 40 mg  40 mg Oral q AM Kc, Ramesh, MD       QUEtiapine (SEROQUEL) tablet 50 mg  50 mg Oral QHS Kc, Ramesh, MD       Current Outpatient Medications  Medication Sig  Dispense Refill   acetaminophen (TYLENOL) 325 MG tablet Take 1-2 tablets (325-650 mg total) by mouth every 4 (four) hours as needed for mild pain.     albuterol (VENTOLIN HFA) 108 (90 Base) MCG/ACT inhaler Inhale 1-2 puffs into the lungs every 6 (six) hours as needed for wheezing or shortness of breath.     ALPRAZolam (XANAX) 0.25 MG tablet Take 0.25 mg by mouth every 6 (six) hours as needed.     aspirin EC 81 MG tablet Take 81 mg by mouth in the morning. Swallow whole.     AURYXIA 1 GM 210 MG(Fe) tablet Take 630 mg by mouth 3 (three) times daily with meals.     B Complex-C (B-COMPLEX WITH VITAMIN C) tablet Take 1 tablet by mouth daily. (Patient taking differently: Take 1 tablet by mouth in the morning.) 30 tablet 0   Brinzolamide-Brimonidine (SIMBRINZA) 1-0.2 % SUSP Place 1 drop into both eyes in the morning, at noon, and at bedtime.     cefUROXime (CEFTIN) 250 MG tablet Take 1 tablet (250 mg total) by mouth daily for 5 days. 5 tablet 0   cinacalcet (SENSIPAR) 30 MG tablet Take 30 mg by  mouth daily.     doxycycline (VIBRAMYCIN) 100 MG capsule Take 1 capsule (100 mg total) by mouth 2 (two) times daily for 5 days. 10 capsule 0   Dulaglutide (TRULICITY) [id number] [id number] SOPN Inject 0.75 mg into the skin every Sunday.     fluticasone furoate-vilanterol (BREO ELLIPTA) 100-25 MCG/INH AEPB Inhale 1 puff into the lungs in the morning.     gabapentin (NEURONTIN) 100 MG capsule Take 100 mg by mouth 2 (two) times daily.     insulin aspart (NOVOLOG FLEXPEN) 100 UNIT/ML FlexPen Inject 4 Units into the skin 3 (three) times daily with meals.     insulin glargine (LANTUS) 100 unit/mL SOPN Inject 4 Units into the skin at bedtime. (Patient taking differently: Inject 8 Units into the skin at bedtime.) 15 mL 0   levothyroxine (SYNTHROID) 125 MCG tablet Take 1 tablet (125 mcg total) by mouth daily before breakfast. 30 tablet 0   lip balm (CARMEX) ointment Apply topically as needed for lip care. (Patient taking  differently: Apply 1 application topically as needed for lip care.) 7 g 0   Methoxy PEG-Epoetin Beta (MIRCERA IJ) Dialysis Monday,Wednesday and friday     midodrine (PROAMATINE) 10 MG tablet Take 1 tablet (10 mg total) by mouth 3 (three) times daily with meals. (Patient taking differently: Take 10 mg by mouth 3 (three) times daily as needed (low blood pressure).) 90 tablet 0   montelukast (SINGULAIR) 10 MG tablet Take 10 mg by mouth in the morning.     Netarsudil Dimesylate (RHOPRESSA) 0.02 % SOLN Place 1 drop into both eyes at bedtime.     Oxycodone HCl 10 MG TABS Take 10 mg by mouth 4 (four) times daily as needed (pain).     OXYGEN Inhale 5 L into the lungs continuous.     pantoprazole (PROTONIX) 40 MG tablet Take 1 tablet (40 mg total) by mouth daily. (Patient taking differently: Take 40 mg by mouth in the morning.) 30 tablet 0   QUEtiapine (SEROQUEL) 50 MG tablet Take 1 tablet (50 mg total) by mouth at bedtime. 30 tablet 0   oxyCODONE-acetaminophen (PERCOCET) 5-325 MG tablet Take 1 tablet by mouth every 6 (six) hours as needed for severe pain. (Patient not taking: Reported on 03/04/2021) 20 tablet 0   Labs: Basic Metabolic Panel: Recent Labs  Lab 03/04/21 1128 03/05/21 0500  NA 132* 134*  K 4.0 3.9  CL 93* 94*  CO2 22 22  GLUCOSE 328* 287*  BUN 34* 44*  CREATININE 7.91* 10.06*  CALCIUM 8.7* 9.2   CBC: Recent Labs  Lab 03/04/21 1128 03/05/21 0500  WBC 17.5* 15.0*  NEUTROABS 14.6*  --   HGB 11.5* 11.4*  HCT 33.7* 34.1*  MCV 88.5 90.5  PLT 271 299   CBG: Recent Labs  Lab 03/05/21 0750 03/05/21 1127  GLUCAP 191* 281*   Studies/Results: CT HEAD WO CONTRAST (5MM)  Result Date: 03/04/2021 CLINICAL DATA:  Cerebral hemorrhage suspected. Weakness during dialysis EXAM: CT HEAD WITHOUT CONTRAST TECHNIQUE: Contiguous axial images were obtained from the base of the skull through the vertex without intravenous contrast. COMPARISON:  11/10/2017 FINDINGS: Brain: No evidence of acute  infarction, hemorrhage, hydrocephalus, extra-axial collection or mass lesion/mass effect. Vascular: No hyperdense vessel or unexpected calcification. Skull: Normal. Negative for fracture or focal lesion. Sinuses/Orbits: No acute finding. Other: None. IMPRESSION: No acute intracranial findings. Electronically Signed   By: Davina Poke D.O.   On: 03/04/2021 16:00   DG Chest Port 1 View  Result Date:  03/04/2021 CLINICAL DATA:  Shortness of breath for 1 week. History of hypotension. EXAM: PORTABLE CHEST 1 VIEW COMPARISON:  Radiographs 02/25/2021 and 10/11/2020. FINDINGS: 1155 hours. Left IJ hemodialysis catheter projects at the level of the mid right atrium. The heart size and mediastinal contours are stable. There is chronic lung disease with parenchymal scarring bilaterally, right greater than left. Interval increased density peripherally in the mid right lung which could reflect atelectasis or a focal infiltrate. There is no overt pulmonary edema, significant pleural effusion or pneumothorax. The bones appear unchanged. Multiple telemetry leads overlie the chest. IMPRESSION: 1. Interval increased focal density peripherally in the mid right lung which could reflect atelectasis or a focal infiltrate. Recommend short-term radiographic follow-up. 2. Otherwise stable chest with bilateral parenchymal scarring. Hemodialysis catheter appears unchanged. Electronically Signed   By: Richardean Sale M.D.   On: 03/04/2021 12:22   EEG adult  Result Date: 03/05/2021 Lora Havens, MD     03/05/2021 12:23 PM Patient Name: Wanda Ramirez MRN: [id number] Epilepsy Attending: Lora Havens Referring Physician/Provider: Dr Derrick Ravel Date: 03/05/2021 Duration: 22.36 mins Patient history: 50 year old female who was at dialysis when her blood pressure dropped and patient had an episode concerning for syncope versus seizure.  EEG to evaluate for seizures. Level of alertness: Awake, asleep AEDs during EEG study:  None Technical aspects: This EEG study was done with scalp electrodes positioned according to the 10-20 International system of electrode placement. Electrical activity was acquired at a sampling rate of '500Hz'$  and reviewed with a high frequency filter of '70Hz'$  and a low frequency filter of '1Hz'$ . EEG data were recorded continuously and digitally stored. Description: The posterior dominant rhythm consists of 9-10 Hz activity of moderate voltage (25-35 uV) seen predominantly in posterior head regions, symmetric and reactive to eye opening and eye closing. Sleep was characterized by vertex waves, sleep spindles (12 to 14 Hz), maximal frontocentral region. Hyperventilation and photic stimulation were not performed.   IMPRESSION: This study is within normal limits. No seizures or epileptiform discharges were seen throughout the recording. Lora Havens   ECHOCARDIOGRAM COMPLETE  Result Date: 03/05/2021    ECHOCARDIOGRAM REPORT   Patient Name:   CHRISTOPHER GIANG Date of Exam: 03/05/2021 Medical Rec #:  [id number]        Height:       62.0 in Accession #:    [id number]       Weight:       215.0 lb Date of Birth:  Oct 18, 1970         BSA:          1.971 m Patient Age:    18 years         BP:           130/75 mmHg Patient Gender: F                HR:           78 bpm. Exam Location:  Inpatient Procedure: 2D Echo, 3D Echo, Cardiac Doppler, Color Doppler and Strain Analysis Indications:    R07.9* Chest pain, unspecified; R55 Syncope  History:        Patient has prior history of Echocardiogram examinations, most                 recent 02/29/2020. Previous Myocardial Infarction,                 Signs/Symptoms:Syncope, Chest Pain, Hypotension and Fever; Risk  Factors:Diabetes, Sleep Apnea and Dyslipidemia. ESRD. Septic                 shock.  Sonographer:    Roseanna Rainbow RDCS Referring Phys: [id number] [facility]  Sonographer Comments: Technically difficult study due to poor echo windows and no parasternal window.  Global longitudinal strain was attempted. Dailysis catheter in parasternal. IMPRESSIONS  1. Consider contrast study or MRI to further evaluate LV apex for non compaction or thormbus.  2. Poor image quality no para sternal views.  3. Prominent LV apical trabeculations doubt thrombus basal inferior wall hypokinesis . Left ventricular ejection fraction, by estimation, is 50 to 55%. The left ventricle has low normal function. The left ventricle demonstrates regional wall motion abnormalities (see scoring diagram/findings for description). There is mild left ventricular hypertrophy. Left ventricular diastolic parameters were normal. The average left ventricular global longitudinal strain is -18.4 %. The global longitudinal strain is normal.  4. Right ventricular systolic function is mildly reduced. The right ventricular size is mildly enlarged.  5. Left atrial size was mildly dilated.  6. The mitral valve is abnormal. Trivial mitral valve regurgitation.  7. The aortic valve was not well visualized. Aortic valve regurgitation is not visualized. Mild to moderate aortic valve sclerosis/calcification is present, without any evidence of aortic stenosis. FINDINGS  Left Ventricle: Prominent LV apical trabeculations doubt thrombus basal inferior wall hypokinesis. Left ventricular ejection fraction, by estimation, is 50 to 55%. The left ventricle has low normal function. The left ventricle demonstrates regional wall  motion abnormalities. The average left ventricular global longitudinal strain is -18.4 %. The global longitudinal strain is normal. The left ventricular internal cavity size was normal in size. There is mild left ventricular hypertrophy. Left ventricular diastolic parameters were normal. Right Ventricle: The right ventricular size is mildly enlarged. Right vetricular wall thickness was not assessed. Right ventricular systolic function is mildly reduced. Left Atrium: Left atrial size was mildly dilated. Right Atrium:  Right atrial size was not assessed. Pericardium: There is no evidence of pericardial effusion. Mitral Valve: The mitral valve is abnormal. There is mild thickening of the mitral valve leaflet(s). Trivial mitral valve regurgitation. Tricuspid Valve: The tricuspid valve is not well visualized. Tricuspid valve regurgitation is mild. Aortic Valve: The aortic valve was not well visualized. Aortic valve regurgitation is not visualized. Mild to moderate aortic valve sclerosis/calcification is present, without any evidence of aortic stenosis. Pulmonic Valve: The pulmonic valve was not assessed. Pulmonic valve regurgitation is not visualized. Aorta: Aortic root could not be assessed. IAS/Shunts: No atrial level shunt detected by color flow Doppler. Additional Comments: Consider contrast study or MRI to further evaluate LV apex for non compaction or thormbus. Poor image quality no para sternal views.  LEFT VENTRICLE PLAX 2D LVOT diam:     1.90 cm     Diastology LV SV:         72          LV e' medial:    7.94 cm/s LV SV Index:   37          LV E/e' medial:  11.4 LVOT Area:     2.84 cm    LV e' lateral:   10.40 cm/s                            LV E/e' lateral: 8.7  LV Volumes (MOD)           2D Longitudinal Strain  LV vol d, MOD A2C: 74.6 ml 2D Strain GLS Avg:     -18.4 % LV vol d, MOD A4C: 68.2 ml LV vol s, MOD A2C: 26.8 ml LV vol s, MOD A4C: 31.5 ml LV SV MOD A2C:     47.8 ml LV SV MOD A4C:     68.2 ml LV SV MOD BP:      43.8 ml RIGHT VENTRICLE             IVC RV S prime:     15.60 cm/s  IVC diam: 1.50 cm TAPSE (M-mode): 2.3 cm LEFT ATRIUM           Index      RIGHT ATRIUM           Index LA Vol (A2C): 18.9 ml 9.59 ml/m RA Area:     11.70 cm LA Vol (A4C): 15.8 ml 8.01 ml/m RA Volume:   26.60 ml  13.49 ml/m  AORTIC VALVE LVOT Vmax:   124.00 cm/s LVOT Vmean:  84.400 cm/s LVOT VTI:    0.255 m MITRAL VALVE MV Area (PHT): 3.77 cm    SHUNTS MV Decel Time: 201 msec    Systemic VTI:  0.26 m MV E velocity: 90.20 cm/s  Systemic  Diam: 1.90 cm MV A velocity: 78.50 cm/s MV E/A ratio:  1.15 Jenkins Rouge MD Electronically signed by Jenkins Rouge MD Signature Date/Time: 03/05/2021/9:35:04 AM    Final     Dialysis Orders:  MWF at [facility] 4:15hr, 40/A1.5, EDW 93.7kg, 2K/2Ca, UFP #2, TDC + AVG, heparin 3000 + 1500 mid-run bolus - Calcitriol 61mg PO q HD - Mircera 2058m IV q 2 weeks (last 9/28)  Assessment/Plan:  Seizure v. syncopal episode: EEG normal, BS was ok. Head CT ok.  Possible RML pneumonia: On Ceftriaxone/azithromycin.  ESRD:  Continue HD per usual MWF schedule - for HD tomorrow.  Hypertension/volume: BP borderline high, UF as tolerated. Looking at her outpatient records and based on exam today, looks like her dry weight likely needs to be increased - will change to 95kg.  Anemia: Hgb 11.4 - no ESA for now.  Metabolic bone disease: Ca 9.2, Phos pending. Continue home meds.  Chronic Resp Failure on home O2  T2DM  Hypothyroidism  [place] [facility] 03/05/2021, 2:01 PM  [employer]

## 2021-03-05 NOTE — Progress Notes (Signed)
Inpatient Diabetes Program Recommendations  AACE/ADA: New Consensus Statement on Inpatient Glycemic Control (2015)  Target Ranges:  Prepandial:   less than 140 mg/dL      Peak postprandial:   less than 180 mg/dL (1-2 hours)      Critically ill patients:  140 - 180 mg/dL   Lab Results  Component Value Date   GLUCAP 243 (H) 03/05/2021   HGBA1C 6.1 (H) 05/06/2020    Review of Glycemic Control  Diabetes history: DM2 Outpatient Diabetes medications: Levemir 8 u its QD, Novolog 4 units TID, Trulicity [id number] mg weekly Current orders for Inpatient glycemic control: Semglee 8 units QHS, Novolog 0-6 units TID with meals and 0-5 HS  Needs updated HgbA1C. CBGs 191, 281, 175, 243 mg/dL  Inpatient Diabetes Program Recommendations:    Add Novolog 3 units TID with meals if eating > 50%  Please order HgbA1C to assess glycemic control prior to admission.  Spoke with pt at bedside. States she has not been taking care of herself. Not checking blood sugars and not taking insulin on a regular basis. States she's going to try to start taking meds and f/u with her PCP. No hypos at home. Discussed s/s and treatment of hypoglycemia. Stressed importance of controlling blood sugars to prevent long and short term complications. Pt appreciative of visit.   Follow glucose trends.   Thank you. Lorenda Peck, RD, LDN, CDE Inpatient Diabetes Coordinator [phone]

## 2021-03-05 NOTE — Progress Notes (Signed)
  Echocardiogram 2D Echocardiogram has been performed.  Bobbye Charleston 03/05/2021, 9:01 AM

## 2021-03-05 NOTE — ED Notes (Signed)
Pt ambulatory to and from restroom without assistance.

## 2021-03-06 ENCOUNTER — Encounter (HOSPITAL_COMMUNITY): Payer: Self-pay | Admitting: Internal Medicine

## 2021-03-06 LAB — GLUCOSE, CAPILLARY
Glucose-Capillary: 156 mg/dL — ABNORMAL HIGH (ref 70–99)
Glucose-Capillary: 200 mg/dL — ABNORMAL HIGH (ref 70–99)
Glucose-Capillary: 201 mg/dL — ABNORMAL HIGH (ref 70–99)

## 2021-03-06 LAB — CBC
HCT: 32.3 % — ABNORMAL LOW (ref 36.0–46.0)
Hemoglobin: 10.8 g/dL — ABNORMAL LOW (ref 12.0–15.0)
MCH: 30 pg (ref 26.0–34.0)
MCHC: 33.4 g/dL (ref 30.0–36.0)
MCV: 89.7 fL (ref 80.0–100.0)
Platelets: 289 10*3/uL (ref 150–400)
RBC: 3.6 MIL/uL — ABNORMAL LOW (ref 3.87–5.11)
RDW: 18.4 % — ABNORMAL HIGH (ref 11.5–15.5)
WBC: 12.9 10*3/uL — ABNORMAL HIGH (ref 4.0–10.5)
nRBC: 0.2 % (ref 0.0–0.2)

## 2021-03-06 LAB — BASIC METABOLIC PANEL
Anion gap: 15 (ref 5–15)
BUN: 58 mg/dL — ABNORMAL HIGH (ref 6–20)
CO2: 19 mmol/L — ABNORMAL LOW (ref 22–32)
Calcium: 8.3 mg/dL — ABNORMAL LOW (ref 8.9–10.3)
Chloride: 100 mmol/L (ref 98–111)
Creatinine, Ser: 12.03 mg/dL — ABNORMAL HIGH (ref 0.44–1.00)
GFR, Estimated: 3 mL/min — ABNORMAL LOW (ref >60)
Glucose, Bld: 175 mg/dL — ABNORMAL HIGH (ref 70–99)
Potassium: 4.1 mmol/L (ref 3.5–5.1)
Sodium: 134 mmol/L — ABNORMAL LOW (ref 135–145)

## 2021-03-06 LAB — HEPATITIS B SURFACE ANTIGEN: Hepatitis B Surface Ag: NONREACTIVE

## 2021-03-06 LAB — HEPATITIS B SURFACE ANTIBODY,QUALITATIVE: Hep B S Ab: REACTIVE — AB

## 2021-03-06 MED ORDER — HEPARIN SODIUM (PORCINE) 1000 UNIT/ML DIALYSIS
20.0000 [IU]/kg | INTRAMUSCULAR | Status: DC | PRN
  Filled 2021-03-06: qty 2
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
## 2021-03-06 NOTE — Progress Notes (Signed)
Ester with [facility] called; stated patient to go on Contact Precautions for ESBL. Information relayed to pt's nurse CD [phone].

## 2021-03-06 NOTE — Progress Notes (Signed)
Transported to HD by wheelchair.

## 2021-03-06 NOTE — Discharge Summary (Signed)
Physician Discharge Summary  Wanda Ramirez [id number] DOB: 1971/02/09 DOA: 03/04/2021  PCP: Darrol Jump, NP  Admit date: 03/04/2021 Discharge date: 03/06/2021  Time spent: 40 minutes  Recommendations for Outpatient Follow-up:  Follow outpatient CBC/CMP Follow echo results outpatient with cardiology - follow up for low normal EF, non compaction vs thrombus Follow syncopal episode outpatient - suspect related to hypotension on dialysis, follow outpateient  - driving restrictions Needs repeat CXR outpatient   Discharge Diagnoses:  Principal Problem:   Syncope Active Problems:   Diabetes mellitus type 2 in obese (HCC)   Hypothyroidism   OSA (obstructive sleep apnea)   CAP (community acquired pneumonia)   Pneumonia   Discharge Condition: stable  Diet recommendation: renal  Filed Weights   03/04/21 1115 03/06/21 1400 03/06/21 1730  Weight: 97.5 kg 98 kg 98 kg    History of present illness:  50 year old female ESRD on HD MWF, chronic hypotension on midodrine, anemia, IDDM, hypothyroidism, HLD, chronic pain syndrome, asthma, chronic hypoxia OSA on CPAP class II obesity sent from dialysis center due to low blood pressure and concern for possible seizure or syncopal episode.  Patient got her dialysis 2-hour 45 minutes out of 4-hour and was noted to be hypotensive with systolic blood pressure in 80s, resolved with 250 mill bolus, staff are concerned that she might have had seizure as her eyes were glassy, patient is not sure if she passed out but she is able to recollect the events before and after.  She had no chest pain shortness of breath.She was seen in the ED monitor leukocytosis chest x-ray is showing evidence of pneumonia in the mid right lung CT head no acute finding, she was admitted for further management  She was admitted with concern for syncope vs seizure.  EEG negative for seizures.  Echo with low normal EF (see below).    She's back to her baseline today.  See  below for additional details  Hospital Course:  Episode of hypotension with questionable seizure versus syncope: Unclear about the events, here EEG no acute finding, CT head no abnormalities.  I suspect this is due to symptomatic hypotension during HD.  Follow-up echocardiogram, monitor telemetry, seizure precaution, continue midodrine.  PT OT evaluation.  No driving x6 months or until cleared.   Elevated troponin 30>56>41>39, without chest pain: Suspect demand mismatch LHC in 2017 showed normal coronaries, continue aspirin.  Echo with poor image quality -> LV apical trabeculations, basal inferior wall hypokinesis, low normal function - recommended considering contrast study or MRI to evaluate LV apex for non compaction or thrombus (discussed with cardiology, noted can follow outpatient)    Community-acquired pneumonia, patient having cough, chest x-ray the mid right lung suspicious for focal infiltrate, with leukocytosis.  Continue ceftriaxone azithromycin.  No lactic acidosis.  Follow-up blood culture. Needs follow up CXR with PCP   ESRD on HD MWF  Renal followed for dialysis   Chronic hypoxic respiratory failure uses 5 L nasal cannula at home with exertion OSA on CPAP: Continue her inhaler Singulair, Breo   Chronic anemia of renal disease monitor H&H.  Continue iron supplements   Diabetes mellitus type 2 in obese with poorly controlled hyperglycemia and Poorly controlled diabetes HbA1c 10.4 back in July, continue sliding scale insulin, resume home insulin regimen - continue basal/bolus regimen   Hypothyroidism-cont synthroid   Obesity Class II, Patient's Body mass index is 39.32 kg/m. : Will benefit with PCP follow-up, weight loss healthy lifestyle and outpatient sleep evaluation  Procedures: Echo  IMPRESSIONS     1. Consider contrast study or MRI to further evaluate LV apex for non  compaction or thormbus.   2. Poor image quality no para sternal views.   3. Prominent LV apical  trabeculations doubt thrombus basal inferior wall  hypokinesis . Left ventricular ejection fraction, by estimation, is 50 to  55%. The left ventricle has low normal function. The left ventricle  demonstrates regional wall motion  abnormalities (see scoring diagram/findings for description). There is  mild left ventricular hypertrophy. Left ventricular diastolic parameters  were normal. The average left ventricular global longitudinal strain is  -18.4 %. The global longitudinal  strain is normal.   4. Right ventricular systolic function is mildly reduced. The right  ventricular size is mildly enlarged.   5. Left atrial size was mildly dilated.   6. The mitral valve is abnormal. Trivial mitral valve regurgitation.   7. The aortic valve was not well visualized. Aortic valve regurgitation  is not visualized. Mild to moderate aortic valve sclerosis/calcification  is present, without any evidence of aortic stenosis.   Consultations: Renal   Discharge Exam: Vitals:   03/06/21 1730 03/06/21 1820  BP: 116/72   Pulse: 72 75  Resp: 18   Temp: 98.2 F (36.8 C) 98.7 F (37.1 C)  SpO2: 97% 97%   Feels better Discussed no driving  General: No acute distress. Cardiovascular: RRR Lungs: unlabored Abdomen: Soft, nontender, nondistended Neurological: Alert and oriented 3. Moves all extremities 4. Cranial nerves II through XII grossly intact. Skin: Warm and dry. No rashes or lesions. Extremities: No clubbing or cyanosis. No edema.  Discharge Instructions   Discharge Instructions     Ambulatory referral to Cardiology   Complete by: As directed    Abnormal echo   Call MD for:  difficulty breathing, headache or visual disturbances   Complete by: As directed    Call MD for:  extreme fatigue   Complete by: As directed    Call MD for:  hives   Complete by: As directed    Call MD for:  persistant dizziness or light-headedness   Complete by: As directed    Call MD for:  persistant  nausea and vomiting   Complete by: As directed    Call MD for:  redness, tenderness, or signs of infection (pain, swelling, redness, odor or green/yellow discharge around incision site)   Complete by: As directed    Call MD for:  severe uncontrolled pain   Complete by: As directed    Call MD for:  temperature >100.4   Complete by: As directed    Diet - low sodium heart healthy   Complete by: As directed    Discharge instructions   Complete by: As directed    You were seen for an episode of loss of consciousness during dialysis.  This occurred with Tamerra Merkley low blood pressure.  I think the most likely cause of your episode was the low blood pressure.  You had Danayah Smyre normal EEG (no seizure activity).  You should follow outpatient with your PCP.  Because of your episode of loss of consciousness you should not drive for 6 months or until you're cleared by Byrd Rushlow doctor.    Your echo (ultrasound of your heart) was abnormal.  It should be followed up with cardiology as an outpatient.  I've placed Donella Pascarella referral for this.  You may need additional imaging to rule out LV non compaction.  Your EF was lower than previously as well, mildly.  These findings can be followed up with cardiology outpatient.  We'll send you with antibiotics.  Your antibiotics were sent to [facility].  You can complete your course with those antibiotics.  Return for new, recurrent, or worsening symptoms.  Please ask your PCP to request records from this hospitalization so they know what was done and what the next steps will be.   Increase activity slowly   Complete by: As directed       Allergies as of 03/06/2021       Reactions   Hydrocodone Itching, Nausea And Vomiting        Medication List     TAKE these medications    acetaminophen 325 MG tablet Commonly known as: TYLENOL Take 1-2 tablets (325-650 mg total) by mouth every 4 (four) hours as needed for mild pain.   albuterol 108 (90 Base) MCG/ACT  inhaler Commonly known as: VENTOLIN HFA Inhale 1-2 puffs into the lungs every 6 (six) hours as needed for wheezing or shortness of breath.   ALPRAZolam 0.25 MG tablet Commonly known as: XANAX Take 0.25 mg by mouth every 6 (six) hours as needed.   aspirin EC 81 MG tablet Take 81 mg by mouth in the morning. Swallow whole.   Auryxia 1 GM 210 MG(Fe) tablet Generic drug: ferric citrate Take 630 mg by mouth 3 (three) times daily with meals.   B-complex with vitamin C tablet Take 1 tablet by mouth daily. What changed: when to take this   Breo Ellipta 100-25 MCG/INH Aepb Generic drug: fluticasone furoate-vilanterol Inhale 1 puff into the lungs in the morning.   cefUROXime 250 MG tablet Commonly known as: CEFTIN Take 1 tablet (250 mg total) by mouth daily for 5 days.   cinacalcet 30 MG tablet Commonly known as: SENSIPAR Take 30 mg by mouth daily.   doxycycline 100 MG capsule Commonly known as: VIBRAMYCIN Take 1 capsule (100 mg total) by mouth 2 (two) times daily for 5 days.   gabapentin 100 MG capsule Commonly known as: NEURONTIN Take 100 mg by mouth 2 (two) times daily.   insulin glargine 100 unit/mL Sopn Commonly known as: LANTUS Inject 4 Units into the skin at bedtime. What changed: how much to take   levothyroxine 125 MCG tablet Commonly known as: SYNTHROID Take 1 tablet (125 mcg total) by mouth daily before breakfast.   lip balm ointment Apply topically as needed for lip care. What changed: how much to take   midodrine 10 MG tablet Commonly known as: PROAMATINE Take 1 tablet (10 mg total) by mouth 3 (three) times daily with meals. What changed:  when to take this reasons to take this   MIRCERA IJ Dialysis Monday,Wednesday and friday   montelukast 10 MG tablet Commonly known as: SINGULAIR Take 10 mg by mouth in the morning.   NovoLOG FlexPen 100 UNIT/ML FlexPen Generic drug: insulin aspart Inject 4 Units into the skin 3 (three) times daily with meals.    Oxycodone HCl 10 MG Tabs Take 10 mg by mouth 4 (four) times daily as needed (pain).   oxyCODONE-acetaminophen 5-325 MG tablet Commonly known as: Percocet Take 1 tablet by mouth every 6 (six) hours as needed for severe pain.   OXYGEN Inhale 5 L into the lungs continuous.   pantoprazole 40 MG tablet Commonly known as: PROTONIX Take 1 tablet (40 mg total) by mouth daily. What changed: when to take this   QUEtiapine 50 MG tablet Commonly known as: SEROQUEL Take 1 tablet (50 mg  total) by mouth at bedtime.   Rhopressa 0.02 % Soln Generic drug: Netarsudil Dimesylate Place 1 drop into both eyes at bedtime.   Simbrinza 1-0.2 % Susp Generic drug: Brinzolamide-Brimonidine Place 1 drop into both eyes in the morning, at noon, and at bedtime.   Trulicity [id number] Sopn Generic drug: Dulaglutide Inject 0.75 mg into the skin every 'Sunday.       Allergies  Allergen Reactions   Hydrocodone Itching and Nausea And Vomiting      The results of significant diagnostics from this hospitalization (including imaging, microbiology, ancillary and laboratory) are listed below for reference.    Significant Diagnostic Studies: CT HEAD WO CONTRAST (5MM)  Result Date: 03/04/2021 CLINICAL DATA:  Cerebral hemorrhage suspected. Weakness during dialysis EXAM: CT HEAD WITHOUT CONTRAST TECHNIQUE: Contiguous axial images were obtained from the base of the skull through the vertex without intravenous contrast. COMPARISON:  11/10/2017 FINDINGS: Brain: No evidence of acute infarction, hemorrhage, hydrocephalus, extra-axial collection or mass lesion/mass effect. Vascular: No hyperdense vessel or unexpected calcification. Skull: Normal. Negative for fracture or focal lesion. Sinuses/Orbits: No acute finding. Other: None. IMPRESSION: No acute intracranial findings. Electronically Signed   By: Nicholas  Plundo D.O.   On: 03/04/2021 16:00   DG Chest Port 1 View  Result Date: 03/04/2021 CLINICAL DATA:   Shortness of breath for 1 week. History of hypotension. EXAM: PORTABLE CHEST 1 VIEW COMPARISON:  Radiographs 02/25/2021 and 10/11/2020. FINDINGS: 1155 hours. Left IJ hemodialysis catheter projects at the level of the mid right atrium. The heart size and mediastinal contours are stable. There is chronic lung disease with parenchymal scarring bilaterally, right greater than left. Interval increased density peripherally in the mid right lung which could reflect atelectasis or Mykenzi Vanzile focal infiltrate. There is no overt pulmonary edema, significant pleural effusion or pneumothorax. The bones appear unchanged. Multiple telemetry leads overlie the chest. IMPRESSION: 1. Interval increased focal density peripherally in the mid right lung which could reflect atelectasis or Vance Hochmuth focal infiltrate. Recommend short-term radiographic follow-up. 2. Otherwise stable chest with bilateral parenchymal scarring. Hemodialysis catheter appears unchanged. Electronically Signed   By: William  Veazey M.D.   On: 03/04/2021 12:22   EEG adult  Result Date: 03/05/2021 Yadav, Priyanka O, MD     10'$ /08/2020 12:23 PM Patient Name: Wanda Ramirez MRN: [id number] Epilepsy Attending: Lora Havens Referring Physician/Provider: Dr Derrick Ravel Date: 03/05/2021 Duration: 22.36 mins Patient history: 50 year old female who was at dialysis when her blood pressure dropped and patient had an episode concerning for syncope versus seizure.  EEG to evaluate for seizures. Level of alertness: Awake, asleep AEDs during EEG study: None Technical aspects: This EEG study was done with scalp electrodes positioned according to the 10-20 International system of electrode placement. Electrical activity was acquired at Peony Barner sampling rate of '500Hz'$  and reviewed with Flay Ghosh high frequency filter of '70Hz'$  and Harmony Sandell low frequency filter of '1Hz'$ . EEG data were recorded continuously and digitally stored. Description: The posterior dominant rhythm consists of 9-10 Hz activity of moderate  voltage (25-35 uV) seen predominantly in posterior head regions, symmetric and reactive to eye opening and eye closing. Sleep was characterized by vertex waves, sleep spindles (12 to 14 Hz), maximal frontocentral region. Hyperventilation and photic stimulation were not performed.   IMPRESSION: This study is within normal limits. No seizures or epileptiform discharges were seen throughout the recording. Lora Havens   ECHOCARDIOGRAM COMPLETE  Result Date: 03/05/2021    ECHOCARDIOGRAM REPORT   Patient Name:   Wanda Ramirez  [place] Date of Exam: 03/05/2021 Medical Rec #:  [id number]        Height:       62.0 in Accession #:    [id number]       Weight:       215.0 lb Date of Birth:  1971-05-02         BSA:          1.971 m Patient Age:    50 years         BP:           130/75 mmHg Patient Gender: F                HR:           78 bpm. Exam Location:  Inpatient Procedure: 2D Echo, 3D Echo, Cardiac Doppler, Color Doppler and Strain Analysis Indications:    R07.9* Chest pain, unspecified; R55 Syncope  History:        Patient has prior history of Echocardiogram examinations, most                 recent 02/29/2020. Previous Myocardial Infarction,                 Signs/Symptoms:Syncope, Chest Pain, Hypotension and Fever; Risk                 Factors:Diabetes, Sleep Apnea and Dyslipidemia. ESRD. Septic                 shock.  Sonographer:    Roseanna Rainbow RDCS Referring Phys: [id number] [facility]  Sonographer Comments: Technically difficult study due to poor echo windows and no parasternal window. Global longitudinal strain was attempted. Dailysis catheter in parasternal. IMPRESSIONS  1. Consider contrast study or MRI to further evaluate LV apex for non compaction or thormbus.  2. Poor image quality no para sternal views.  3. Prominent LV apical trabeculations doubt thrombus basal inferior wall hypokinesis . Left ventricular ejection fraction, by estimation, is 50 to 55%. The left ventricle has low normal function. The left  ventricle demonstrates regional wall motion abnormalities (see scoring diagram/findings for description). There is mild left ventricular hypertrophy. Left ventricular diastolic parameters were normal. The average left ventricular global longitudinal strain is -18.4 %. The global longitudinal strain is normal.  4. Right ventricular systolic function is mildly reduced. The right ventricular size is mildly enlarged.  5. Left atrial size was mildly dilated.  6. The mitral valve is abnormal. Trivial mitral valve regurgitation.  7. The aortic valve was not well visualized. Aortic valve regurgitation is not visualized. Mild to moderate aortic valve sclerosis/calcification is present, without any evidence of aortic stenosis. FINDINGS  Left Ventricle: Prominent LV apical trabeculations doubt thrombus basal inferior wall hypokinesis. Left ventricular ejection fraction, by estimation, is 50 to 55%. The left ventricle has low normal function. The left ventricle demonstrates regional wall  motion abnormalities. The average left ventricular global longitudinal strain is -18.4 %. The global longitudinal strain is normal. The left ventricular internal cavity size was normal in size. There is mild left ventricular hypertrophy. Left ventricular diastolic parameters were normal. Right Ventricle: The right ventricular size is mildly enlarged. Right vetricular wall thickness was not assessed. Right ventricular systolic function is mildly reduced. Left Atrium: Left atrial size was mildly dilated. Right Atrium: Right atrial size was not assessed. Pericardium: There is no evidence of pericardial effusion. Mitral Valve: The mitral valve is abnormal. There is mild thickening of the mitral valve leaflet(s). Trivial  mitral valve regurgitation. Tricuspid Valve: The tricuspid valve is not well visualized. Tricuspid valve regurgitation is mild. Aortic Valve: The aortic valve was not well visualized. Aortic valve regurgitation is not visualized.  Mild to moderate aortic valve sclerosis/calcification is present, without any evidence of aortic stenosis. Pulmonic Valve: The pulmonic valve was not assessed. Pulmonic valve regurgitation is not visualized. Aorta: Aortic root could not be assessed. IAS/Shunts: No atrial level shunt detected by color flow Doppler. Additional Comments: Consider contrast study or MRI to further evaluate LV apex for non compaction or thormbus. Poor image quality no para sternal views.  LEFT VENTRICLE PLAX 2D LVOT diam:     1.90 cm     Diastology LV SV:         72          LV e' medial:    7.94 cm/s LV SV Index:   37          LV E/e' medial:  11.4 LVOT Area:     2.84 cm    LV e' lateral:   10.40 cm/s                            LV E/e' lateral: 8.7  LV Volumes (MOD)           2D Longitudinal Strain LV vol d, MOD A2C: 74.6 ml 2D Strain GLS Avg:     -18.4 % LV vol d, MOD A4C: 68.2 ml LV vol s, MOD A2C: 26.8 ml LV vol s, MOD A4C: 31.5 ml LV SV MOD A2C:     47.8 ml LV SV MOD A4C:     68.2 ml LV SV MOD BP:      43.8 ml RIGHT VENTRICLE             IVC RV S prime:     15.60 cm/s  IVC diam: 1.50 cm TAPSE (M-mode): 2.3 cm LEFT ATRIUM           Index      RIGHT ATRIUM           Index LA Vol (A2C): 18.9 ml 9.59 ml/m RA Area:     11.70 cm LA Vol (A4C): 15.8 ml 8.01 ml/m RA Volume:   26.60 ml  13.49 ml/m  AORTIC VALVE LVOT Vmax:   124.00 cm/s LVOT Vmean:  84.400 cm/s LVOT VTI:    0.255 m MITRAL VALVE MV Area (PHT): 3.77 cm    SHUNTS MV Decel Time: 201 msec    Systemic VTI:  0.26 m MV E velocity: 90.20 cm/s  Systemic Diam: 1.90 cm MV Christel Bai velocity: 78.50 cm/s MV E/Avanell Banwart ratio:  1.15 Jenkins Rouge MD Electronically signed by Jenkins Rouge MD Signature Date/Time: 03/05/2021/9:35:04 AM    Final     Microbiology: Recent Results (from the past 240 hour(s))  Culture, blood (routine x 2)     Status: None (Preliminary result)   Collection Time: 03/04/21 10:43 PM   Specimen: BLOOD  Result Value Ref Range Status   Specimen Description BLOOD SITE NOT  SPECIFIED  Final   Special Requests   Final    BOTTLES DRAWN AEROBIC AND ANAEROBIC Blood Culture adequate volume   Culture   Final    NO GROWTH 2 DAYS Performed at [facility], [address], [place], [place] [address]    Report Status PENDING  Incomplete  Resp Panel by RT-PCR (Flu Kamal Jurgens&B, Covid) Nasopharyngeal Swab  Status: None   Collection Time: 03/04/21 11:07 PM   Specimen: Nasopharyngeal Swab; Nasopharyngeal(NP) swabs in vial transport medium  Result Value Ref Range Status   SARS Coronavirus 2 by RT PCR NEGATIVE NEGATIVE Final    Comment: (NOTE) SARS-CoV-2 target nucleic acids are NOT DETECTED.  The SARS-CoV-2 RNA is generally detectable in upper respiratory specimens during the acute phase of infection. The lowest concentration of SARS-CoV-2 viral copies this assay can detect is 138 copies/mL. Vonita Calloway negative result does not preclude SARS-Cov-2 infection and should not be used as the sole basis for treatment or other patient management decisions. Quinton Voth negative result may occur with  improper specimen collection/handling, submission of specimen other than nasopharyngeal swab, presence of viral mutation(s) within the areas targeted by this assay, and inadequate number of viral copies(<138 copies/mL). Aislin Onofre negative result must be combined with clinical observations, patient history, and epidemiological information. The expected result is Negative.  Fact Sheet for Patients:  EntrepreneurPulse.com.au  Fact Sheet for Healthcare Providers:  IncredibleEmployment.be  This test is no t yet approved or cleared by the [place] FDA and  has been authorized for detection and/or diagnosis of SARS-CoV-2 by FDA under an Emergency Use Authorization (EUA). This EUA will remain  in effect (meaning this test can be used) for the duration of the COVID-19 declaration under Section 564(b)(1) of the Act, 21 U.S.C.section 360bbb-3(b)(1), unless the  authorization is terminated  or revoked sooner.       Influenza Pearley Baranek by PCR NEGATIVE NEGATIVE Final   Influenza B by PCR NEGATIVE NEGATIVE Final    Comment: (NOTE) The Xpert Xpress SARS-CoV-2/FLU/RSV plus assay is intended as an aid in the diagnosis of influenza from Nasopharyngeal swab specimens and should not be used as Nola Botkins sole basis for treatment. Nasal washings and aspirates are unacceptable for Xpert Xpress SARS-CoV-2/FLU/RSV testing.  Fact Sheet for Patients: EntrepreneurPulse.com.au  Fact Sheet for Healthcare Providers: IncredibleEmployment.be  This test is not yet approved or cleared by the [place] FDA and has been authorized for detection and/or diagnosis of SARS-CoV-2 by FDA under an Emergency Use Authorization (EUA). This EUA will remain in effect (meaning this test can be used) for the duration of the COVID-19 declaration under Section 564(b)(1) of the Act, 21 U.S.C. section 360bbb-3(b)(1), unless the authorization is terminated or revoked.  Performed at [facility], [place] [address] [place], [place] [address]      Labs: Basic Metabolic Panel: Recent Labs  Lab 03/04/21 1128 03/05/21 0500 03/06/21 0146  NA 132* 134* 134*  K 4.0 3.9 4.1  CL 93* 94* 100  CO2 22 22 19*  GLUCOSE 328* 287* 175*  BUN 34* 44* 58*  CREATININE 7.91* 10.06* 12.03*  CALCIUM 8.7* 9.2 8.3*   Liver Function Tests: No results for input(s): AST, ALT, ALKPHOS, BILITOT, PROT, ALBUMIN in the last 168 hours. No results for input(s): LIPASE, AMYLASE in the last 168 hours. No results for input(s): AMMONIA in the last 168 hours. CBC: Recent Labs  Lab 03/04/21 1128 03/05/21 0500 03/06/21 0146  WBC 17.5* 15.0* 12.9*  NEUTROABS 14.6*  --   --   HGB 11.5* 11.4* 10.8*  HCT 33.7* 34.1* 32.3*  MCV 88.5 90.5 89.7  PLT 271 299 289   Cardiac Enzymes: No results for input(s): CKTOTAL, CKMB, CKMBINDEX, TROPONINI in the last 168 hours. BNP: BNP  (last 3 results) No results for input(s): BNP in the last 8760 hours.  ProBNP (last 3 results) No results for input(s): PROBNP in the  last 8760 hours.  CBG: Recent Labs  Lab 03/05/21 1642 03/05/21 2128 03/06/21 0631 03/06/21 1208 03/06/21 1817  GLUCAP 175* 243* 156* 200* 201*       Signed:  Fayrene Helper MD.  [facility] 03/06/2021, 11:21 PM

## 2021-03-06 NOTE — Plan of Care (Signed)
  Problem: Education: Goal: Knowledge of General Education information will improve Description: Including pain rating scale, medication(s)/side effects and non-pharmacologic comfort measures Outcome: Progressing   Problem: Health Behavior/Discharge Planning: Goal: Ability to manage health-related needs will improve Outcome: Progressing   Problem: Clinical Measurements: Goal: Ability to maintain clinical measurements within normal limits will improve Outcome: Progressing Goal: Will remain free from infection Outcome: Progressing Goal: Diagnostic test results will improve Outcome: Progressing   Problem: Pain Managment: Goal: General experience of comfort will improve Outcome: Progressing

## 2021-03-06 NOTE — Plan of Care (Signed)
  Problem: Education: Goal: Knowledge of General Education information will improve Description: Including pain rating scale, medication(s)/side effects and non-pharmacologic comfort measures Outcome: Adequate for Discharge   Problem: Health Behavior/Discharge Planning: Goal: Ability to manage health-related needs will improve Outcome: Adequate for Discharge   Problem: Clinical Measurements: Goal: Ability to maintain clinical measurements within normal limits will improve Outcome: Adequate for Discharge Goal: Will remain free from infection Outcome: Adequate for Discharge Goal: Diagnostic test results will improve Outcome: Adequate for Discharge Goal: Respiratory complications will improve Outcome: Adequate for Discharge Goal: Cardiovascular complication will be avoided Outcome: Adequate for Discharge   Problem: Activity: Goal: Risk for activity intolerance will decrease Outcome: Adequate for Discharge   Problem: Nutrition: Goal: Adequate nutrition will be maintained Outcome: Adequate for Discharge   Problem: Coping: Goal: Level of anxiety will decrease Outcome: Adequate for Discharge   Problem: Elimination: Goal: Will not experience complications related to bowel motility Outcome: Adequate for Discharge Goal: Will not experience complications related to urinary retention Outcome: Adequate for Discharge   Problem: Pain Managment: Goal: General experience of comfort will improve Outcome: Adequate for Discharge   Problem: Safety: Goal: Ability to remain free from injury will improve Outcome: Adequate for Discharge   Problem: Skin Integrity: Goal: Risk for impaired skin integrity will decrease Outcome: Adequate for Discharge   Problem: Education: Goal: Knowledge of General Education information will improve Description: Including pain rating scale, medication(s)/side effects and non-pharmacologic comfort measures Outcome: Adequate for Discharge   Problem: Health  Behavior/Discharge Planning: Goal: Ability to manage health-related needs will improve Outcome: Adequate for Discharge   Problem: Clinical Measurements: Goal: Ability to maintain clinical measurements within normal limits will improve Outcome: Adequate for Discharge Goal: Will remain free from infection Outcome: Adequate for Discharge Goal: Diagnostic test results will improve Outcome: Adequate for Discharge Goal: Respiratory complications will improve Outcome: Adequate for Discharge Goal: Cardiovascular complication will be avoided Outcome: Adequate for Discharge   Problem: Activity: Goal: Risk for activity intolerance will decrease Outcome: Adequate for Discharge   Problem: Nutrition: Goal: Adequate nutrition will be maintained Outcome: Adequate for Discharge   Problem: Coping: Goal: Level of anxiety will decrease Outcome: Adequate for Discharge   Problem: Elimination: Goal: Will not experience complications related to bowel motility Outcome: Adequate for Discharge Goal: Will not experience complications related to urinary retention Outcome: Adequate for Discharge   Problem: Pain Managment: Goal: General experience of comfort will improve Outcome: Adequate for Discharge   Problem: Safety: Goal: Ability to remain free from injury will improve Outcome: Adequate for Discharge   Problem: Skin Integrity: Goal: Risk for impaired skin integrity will decrease Outcome: Adequate for Discharge   Problem: Acute Rehab PT Goals(only PT should resolve) Goal: Pt Will Ambulate Outcome: Adequate for Discharge Goal: Pt Will Go Up/Down Stairs Outcome: Adequate for Discharge

## 2021-03-06 NOTE — Evaluation (Signed)
Physical Therapy Evaluation Patient Details Name: Wanda Ramirez MRN: [id number] DOB: Jan 26, 1971 Today's Date: 03/06/2021  History of Present Illness  Wanda Ramirez is a 50 y.o. female who presented to the ED from her dialysis center due to hypotension and concern for possible seizure or syncopal episode. EEG negative, head CT negative. PMH: ESRD on HD, hypotension, anemia of CKF, DM II, HLD, chronic pain syndrome, asthma, chronic hypoxemic respiratory failure on home oxygen  Clinical Impression  PTA pt living with daughter in [place] apartment with bed upstairs. Pt reports independence with mobility in her home and sometimes uses a RW in the community as her R knee buckles some times. Pt reports independence with bADLs, and cooking and cleaning. Daughter provides transportation to HD and assist with shopping. Pt independent with bed mobility, and transfers. Pt ambulates with mod I today due to RW use. Pt will not have any PT or equipment needs at discharge. PT will continue to see acutely to practice stairs if pt does not discharge today.        Recommendations for follow up therapy are one component of a multi-disciplinary discharge planning process, led by the attending physician.  Recommendations may be updated based on patient status, additional functional criteria and insurance authorization.  Follow Up Recommendations No PT follow up    Equipment Recommendations  None recommended by PT (has necessary equipment)       Precautions / Restrictions Precautions Precautions: Fall Precaution Comments: relatively low fall risk Restrictions Weight Bearing Restrictions: No      Mobility  Bed Mobility Overal bed mobility: Independent                  Transfers Overall transfer level: Independent Equipment used: None                Ambulation/Gait Ambulation/Gait assistance: Modified independent (Device/Increase time) Gait Distance (Feet): 350 Feet Assistive  device: Rolling walker (2 wheeled) Gait Pattern/deviations: Step-through pattern;WFL(Within Functional Limits) Gait velocity: WFL Gait velocity interpretation: >2.62 ft/sec, indicative of community ambulatory General Gait Details: strong, steady gait, states she does not always use RW but sometimes R knee buckles, none noted today         Balance Overall balance assessment: No apparent balance deficits (not formally assessed)                                           Pertinent Vitals/Pain Pain Assessment: Faces Faces Pain Scale: No hurt    Home Living Family/patient expects to be discharged to:: Private residence Living Arrangements: Children;Other relatives (2 grandchildren) Available Help at Discharge: Family;Available PRN/intermittently (daughter works) Type of Home: Apartment Home Access: Level entry     Home Layout: Multi-level;1/2 bath on main level Home Equipment: Walker - 2 wheels      Prior Function Level of Independence: Independent         Comments: use of RW occasionally with community mobility, no AD use in the home. Reports independent with sponge bathing, dressing, laundry and cooking (though will share this with daughter). Daughter has started doing all grocery shopping and driving her to/from HD     Hand Dominance   Dominant Hand: Right    Extremity/Trunk Assessment   Upper Extremity Assessment Upper Extremity Assessment: Defer to OT evaluation    Lower Extremity Assessment Lower Extremity Assessment: Overall WFL for tasks assessed  Cervical / Trunk Assessment Cervical / Trunk Assessment: Normal  Communication   Communication: No difficulties  Cognition Arousal/Alertness: Awake/alert Behavior During Therapy: WFL for tasks assessed/performed Overall Cognitive Status: Within Functional Limits for tasks assessed                                        General Comments General comments (skin integrity,  edema, etc.): BP supine 88/59, sitting 118/75 after ambulation 126/71, Pt states that her daughter commited suicide recently and memorial service end of this week, she wants very much to be discharged today so that she can take care of necessary arrangements        Assessment/Plan    PT Assessment Patient needs continued PT services  PT Problem List Decreased activity tolerance       PT Treatment Interventions DME instruction;Gait training;Stair training;Functional mobility training;Therapeutic activities;Therapeutic exercise;Balance training    PT Goals (Current goals can be found in the Care Plan section)  Acute Rehab PT Goals Patient Stated Goal: go home PT Goal Formulation: With patient Time For Goal Achievement: 03/20/21 Potential to Achieve Goals: Good    Frequency Min 3X/week    AM-PAC PT "6 Clicks" Mobility  Outcome Measure Help needed turning from your back to your side while in a flat bed without using bedrails?: None Help needed moving from lying on your back to sitting on the side of a flat bed without using bedrails?: None Help needed moving to and from a bed to a chair (including a wheelchair)?: None Help needed standing up from a chair using your arms (e.g., wheelchair or bedside chair)?: None Help needed to walk in hospital room?: None Help needed climbing 3-5 steps with a railing? : A Little 6 Click Score: 23    End of Session Equipment Utilized During Treatment: Gait belt Activity Tolerance: Patient tolerated treatment well Patient left: in chair;with call bell/phone within reach Nurse Communication: Mobility status PT Visit Diagnosis: Muscle weakness (generalized) (M62.81)    Time: [id number] PT Time Calculation (min) (ACUTE ONLY): 20 min   Charges:   PT Evaluation $PT Eval Moderate Complexity: 1 Mod          Liller Yohn B. [name] PT, DPT Acute Rehabilitation Services Pager [phone] Office [phone]   [place] 03/06/2021, 9:41 AM

## 2021-03-06 NOTE — Progress Notes (Signed)
[facility] Progress Note  Subjective: seen in room, no c/o's today  Vitals:   03/05/21 2331 03/06/21 0335 03/06/21 0728 03/06/21 1200  BP: (!) 132/98 98/81 114/81   Pulse: 88 81 69   Resp:  20 16   Temp: 98 F (36.7 C) 97.8 F (36.6 C) 97.8 F (36.6 C) 98.5 F (36.9 C)  TempSrc: Oral Oral Oral Oral  SpO2:  100% 93%   Weight:      Height:        Exam:  alert, nad   no jvd  Chest cta bilat  Cor reg no RG  Abd soft ntnd no ascites   Ext no LE edema   Alert, NF, ox3   TDC, AVG+bruit    OP HD: MWF at [facility]   4h 50mn  400/1.5  93.5kg  2/2 bath TDC +AVG  Hep 3000+ 15035mrun - Calcitriol 76m34mPO q HD - Mircera 200m65mV q 2 weeks (last 9/28)   Assessment/Plan:  Seizure v. syncopal episode: EEG normal, BS was ok. Head CT ok.  CAP/ RML pneumonia: On Ceftriaxone/azithromycin. WBC improving.   ESRD: continue HD MWF. HD today.  Hypertension/volume: BP borderline high, looks like her dry weight likely needs to be increased - will change to 95kg.  Anemia: Hgb 11.4 - no ESA for now.  Metabolic bone disease: Ca 9.2, Phos pending. Continue home meds.  Chronic Resp Failure on home O2  T2DM  Hypothyroidism    Rob Lendy Dittrich 03/06/2021, 12:25 PM   Recent Labs  Lab 03/05/21 0500 03/06/21 0146  K 3.9 4.1  BUN 44* 58*  CREATININE 10.06* 12.03*  CALCIUM 9.2 8.3*  HGB 11.4* 10.8*   Inpatient medications:  aspirin EC  81 mg Oral q AM   B-complex with vitamin C  1 tablet Oral q AM   brinzolamide  1 drop Both Eyes TID   And   brimonidine  1 drop Both Eyes TID   Chlorhexidine Gluconate Cloth  6 each Topical Q0600   cinacalcet  30 mg Oral Daily   ferric citrate  630 mg Oral TID WC   fluticasone furoate-vilanterol  1 puff Inhalation q AM   gabapentin  100 mg Oral BID   heparin  5,000 Units Subcutaneous Q8H   insulin aspart  0-5 Units Subcutaneous QHS   insulin aspart  0-6 Units Subcutaneous TID WC   insulin glargine-yfgn  8 Units Subcutaneous QHS    levothyroxine  125 mcg Oral QAC breakfast   midodrine  10 mg Oral TID WC   montelukast  10 mg Oral q AM   Netarsudil Dimesylate  1 drop Both Eyes QHS   pantoprazole  40 mg Oral q AM   QUEtiapine  50 mg Oral QHS    azithromycin 500 mg (03/05/21 2204)   cefTRIAXone (ROCEPHIN)  IV 1 g (03/05/21 2113)   acetaminophen, albuterol, ALPRAZolam, heparin, oxyCODONE

## 2021-03-06 NOTE — Evaluation (Addendum)
Occupational Therapy Evaluation/Discharge Patient Details Name: Wanda Ramirez MRN: [id number] DOB: [birth year] Today's Date: 03/06/2021   History of Present Illness Wanda Ramirez is a 50 y.o. female who presented to the ED from her dialysis center due to hypotension and concern for possible seizure or syncopal episode. EEG negative, head CT negative. PMH: ESRD on HD, hypotension, anemia of CKF, DM II, HLD, chronic pain syndrome, asthma, chronic hypoxemic respiratory failure on home oxygen   Clinical Impression   PTA, pt lives with family in first floor apartment and reports Independence with ADLs, household IADLs and mobility (occasional RW use outside of the home). Daughter assists with grocery shopping and HD transport. Pt presents now at baseline for ADLs/mobility with no reports of lightheadedness and no overt LOB noted. Pt denies any concerns for completion of tasks at home. VSS on RA. No further skilled OT services needed at acute level or on DC.      Recommendations for follow up therapy are one component of a multi-disciplinary discharge planning process, led by the attending physician.  Recommendations may be updated based on patient status, additional functional criteria and insurance authorization.   Follow Up Recommendations  No OT follow up    Equipment Recommendations  None recommended by OT    Recommendations for Other Services       Precautions / Restrictions Precautions Precautions: Fall Precaution Comments: relatively low fall risk Restrictions Weight Bearing Restrictions: No      Mobility Bed Mobility Overal bed mobility: Independent                  Transfers Overall transfer level: Independent Equipment used: None                  Balance Overall balance assessment: No apparent balance deficits (not formally assessed)                                         ADL either performed or assessed with clinical judgement    ADL Overall ADL's : Independent                                       General ADL Comments: able to mobilize to/from sink across room without AD. able to stand for various grooming tasks without issue. per chart, was ambulating to/from bathroom in ED     Vision Ability to See in Adequate Light: 0 Adequate Patient Visual Report: No change from baseline Vision Assessment?: No apparent visual deficits     Perception     Praxis      Pertinent Vitals/Pain Pain Assessment: Faces Faces Pain Scale: No hurt     Hand Dominance Right   Extremity/Trunk Assessment Upper Extremity Assessment Upper Extremity Assessment: Overall WFL for tasks assessed   Lower Extremity Assessment Lower Extremity Assessment: Defer to PT evaluation   Cervical / Trunk Assessment Cervical / Trunk Assessment: Normal   Communication Communication Communication: No difficulties   Cognition Arousal/Alertness: Awake/alert Behavior During Therapy: WFL for tasks assessed/performed Overall Cognitive Status: Within Functional Limits for tasks assessed                                     General Comments  BP 123/87  after mobility, denies lightheadedness    Exercises     Shoulder Instructions      Home Living Family/patient expects to be discharged to:: Private residence Living Arrangements: Children;Other relatives (2 grandchildren) Available Help at Discharge: Family;Available PRN/intermittently (daughter works) Type of Home: Apartment Home Access: Level entry           Bathroom Shower/Tub: Teacher, early years/pre: Standard Bathroom Accessibility: Yes   Home Equipment: Environmental consultant - 2 wheels          Prior Functioning/Environment Level of Independence: Independent        Comments: use of RW occasionally with community mobility, no AD use in the home. Reports independent with sponge bathing, dressing, laundry and cooking (though will share this with  daughter).        OT Problem List:        OT Treatment/Interventions:      OT Goals(Current goals can be found in the care plan section) Acute Rehab OT Goals Patient Stated Goal: go home OT Goal Formulation: All assessment and education complete, DC therapy  OT Frequency:     Barriers to D/C:            Co-evaluation              AM-PAC OT "6 Clicks" Daily Activity     Outcome Measure Help from another person eating meals?: None Help from another person taking care of personal grooming?: None Help from another person toileting, which includes using toliet, bedpan, or urinal?: None Help from another person bathing (including washing, rinsing, drying)?: None Help from another person to put on and taking off regular upper body clothing?: None Help from another person to put on and taking off regular lower body clothing?: None 6 Click Score: 24   End of Session    Activity Tolerance: Patient tolerated treatment well Patient left: in bed;with call bell/phone within reach  OT Visit Diagnosis: Other abnormalities of gait and mobility (R26.89)                [id number] OT Time Calculation (min): 14 min Charges:  OT General Charges $OT Visit: 1 Visit OT Evaluation $OT Eval Low Complexity: 1 Low  Malachy Chamber, OTR/L Acute Rehab Services Office: [phone]   Layla Maw 03/06/2021, 8:40 AM

## 2021-03-06 NOTE — Progress Notes (Signed)
MD wants to have iv antibiotic  to be given prior to discharge home. Pt.and daughter made aware.  Rocephin started.

## 2021-03-06 NOTE — Progress Notes (Signed)
Back from HD by wheelchair awake and alert.

## 2021-03-07 ENCOUNTER — Telehealth (HOSPITAL_COMMUNITY): Payer: Self-pay | Admitting: Nephrology

## 2021-03-07 LAB — HEPATITIS B SURFACE ANTIBODY, QUANTITATIVE: Hep B S AB Quant (Post): 207.8 m[IU]/mL (ref >9.9)

## 2021-03-07 NOTE — Telephone Encounter (Signed)
Transition of care contact from inpatient facility  Date of Discharge: 03/06/21 Date of Contact:03/07/21 - attempted Method of contact: Phone  Attempted to contact patient to discuss transition of care from inpatient admission. Patient did not answer the phone. Message was left on the patient's voicemail with call back number [phone].  Veneta Penton, PA-C [employer] Pager [phone]

## 2021-03-09 LAB — CULTURE, BLOOD (ROUTINE X 2)
Culture: NO GROWTH
Special Requests: ADEQUATE

## 2021-04-04 ENCOUNTER — Encounter (HOSPITAL_COMMUNITY): Payer: Self-pay | Admitting: Radiology

## 2021-04-15 DIAGNOSIS — J45909 Unspecified asthma, uncomplicated: Secondary | ICD-10-CM | POA: Insufficient documentation

## 2021-04-15 DIAGNOSIS — Z9981 Dependence on supplemental oxygen: Secondary | ICD-10-CM | POA: Insufficient documentation

## 2021-04-15 DIAGNOSIS — M549 Dorsalgia, unspecified: Secondary | ICD-10-CM | POA: Insufficient documentation

## 2021-04-15 DIAGNOSIS — Z9289 Personal history of other medical treatment: Secondary | ICD-10-CM | POA: Insufficient documentation

## 2021-04-15 DIAGNOSIS — F419 Anxiety disorder, unspecified: Secondary | ICD-10-CM | POA: Insufficient documentation

## 2021-04-15 DIAGNOSIS — G8929 Other chronic pain: Secondary | ICD-10-CM | POA: Insufficient documentation

## 2021-04-15 DIAGNOSIS — E119 Type 2 diabetes mellitus without complications: Secondary | ICD-10-CM | POA: Insufficient documentation

## 2021-04-15 NOTE — Progress Notes (Signed)
Cardiology Office Note:    Date:  04/16/2021   ID:  Wanda Ramirez, DOB 1970-12-09, MRN [id number]  PCP:  Darrol Jump, NP  Cardiologist:  Shirlee More, MD   Referring MD: Elodia Florence.,*  ASSESSMENT:    1. Abnormal echocardiogram   2. Benign hypertension with CKD (chronic kidney disease) stage V ([place])   3. 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  Okay to bring her back to my office repeat an echocardiogram attention to LV apex and if necessary give her definitive contrast.  If unable to make a decision cardiac MR be appropriate Stable managed with renal replacement therapy Stable she has had no recurrent episodes of syncope or hypotension and she will continue midodrine  Next appointment 6 weeks   Medication Adjustments/Labs and Tests Ordered: Current medicines are reviewed at length with the patient today.  Concerns regarding medicines are outlined above.  Orders Placed This Encounter  Procedures   ECHOCARDIOGRAM COMPLETE   No orders of the defined types were placed in this encounter.     Chief complaint: Her echocardiogram was abnormal during admission to [facility] and I was told to see cardiology for follow-up  History of Present Illness:    Wanda Ramirez is a 50 y.o. female who is being seen today for the evaluation of abnormal echocardiogram at the request of Elodia Florence.,*.  Is a very complex issue with review of extensive records and personal review of her echocardiogram along with her echo tech my office today Has a history of longstanding diabetes  hypertension renal failure and renal replacement therapy She is not having edema shortness of breath chest pain palpitation or recurrent syncope since hospital discharge  She was seen in consultation 02/08/2018 for chest pain referred for coronary angiography.  She was previously seen by Dr. Prentice Docker [facility] cardiology 07/27/2019, I previously practiced with him and she has a history of a myocardial perfusion study showing fixed apical defect consistent with myocardial infarction with reduced ejection fraction 44% She was recently admitted to [facility] 03/06/2021 with a syncopal episode felt to be related to hypotension with hemodialysis.  Her history is noteworthy for end-stage renal disease on maintenance hemodialysis chronic hypotension requiring midodrine support anemia insulin-dependent diabetes hyperlipidemia obstructive sleep apnea on CPAP and obesity.  She had elevated troponin felt to be due to demand ischemia.  She also had community-acquired pneumonia and was treated with broad-spectrum antibiotics.  Echocardiogram performed during recent hospitalization showed prominent LV apical trabeculations with recommendation for MRI to evaluate LV apex for not contraction for thrombus.  1. Consider contrast study or MRI to further evaluate LV apex for non  compaction or thormbus.   2. Poor image quality no para sternal views.   3. Prominent LV apical trabeculations doubt thrombus basal inferior wall  hypokinesis . Left ventricular ejection fraction, by estimation, is 50 to  55%. The left ventricle has low normal function. The left ventricle  demonstrates regional wall motion  abnormalities (see scoring diagram/findings for description). There is  mild left ventricular hypertrophy. Left ventricular diastolic parameters  were normal. The average left ventricular global longitudinal strain is  -18.4 %. The global longitudinal  strain is normal.   4. Right ventricular  systolic function is mildly reduced. The right  ventricular size is mildly enlarged.   5. Left atrial size was mildly dilated.   6. The mitral valve is abnormal. Trivial mitral valve regurgitation.   7. The aortic valve was not well visualized. Aortic valve regurgitation  is not visualized. Mild to moderate aortic valve sclerosis/calcification  is present, without any evidence of aortic stenosis.  EKG 03/05/2021 shows sinus rhythm right atrial enlargement otherwise normal no pattern myocardial infarction  Coronary angiography performed October 2017 with normal coronary arteriography normal left ventricular systolic function. Conclusion    The left ventricular systolic function is normal. LV end diastolic pressure is normal. The left ventricular ejection fraction is 55-65% by visual estimate. There is no mitral valve regurgitation.   1. No angiographic evidence of CAD 2. Normal LV systolic function 3. Non-cardiac chest pain   Recommendations: No further ischemic workup  Past Medical History:  Diagnosis Date   Anemia    Anxiety    Arthritis    Asthma    Chronic back pain    Diabetes mellitus ([place])    Type II   Dyspnea    with exertion - 4L oxygen via [place]   ESRD (end stage renal disease) on dialysis ([place])    [facility] since 2016   Essential hypertension    GERD (gastroesophageal reflux disease)    Glaucoma    History of blood transfusion    Hyperlipidemia    Hypothyroidism    Morbid obesity ([place])    Myocardial infarction ([place])    mild per patient   Peripheral neuropathy    left foot   Requires continuous at home supplemental oxygen    4L via [place]   Sleep apnea    wears CPAP, does not know setting    Past Surgical History:  Procedure Laterality Date   AV FISTULA PLACEMENT Left 04/2014   AV FISTULA PLACEMENT Right 08/15/2020   Procedure: RIGHT BRACHIOCEPHALIC ARTERIOVENOUS (AV) FISTULA CREATION;  Surgeon: Waynetta Sandy, MD;  Location:  [place];  Service: Vascular;  Laterality: Right;   AV FISTULA PLACEMENT Right 01/23/2021   Procedure: INSERTION OF RIGHT UPPER ARM ARTERIOVENOUS (AV) GORE-TEX GRAFT, 4-7 mm x 45 cm STRETCH VASCULAR GRAFT;  Surgeon: Serafina Mitchell, MD;  Location: [place];  Service: Cardiovascular;  Laterality: Right;   CARDIAC CATHETERIZATION N/A 03/17/2016   Procedure: Left Heart Cath and Coronary Angiography;  Surgeon: [name], MD;  Location: [place] CV [facility];  Service: Cardiovascular;  Laterality: N/A;   INCISION AND DRAINAGE PERIRECTAL ABSCESS N/A 04/03/2020   Procedure: EXCISIONAL DEBRIDEMENT OF SACRAL AND GLUTEAL WOUNDS;  Surgeon: [name], MD;  Location: [place];  Service: General;  Laterality: N/A;   IR FLUORO GUIDE CV LINE LEFT  02/22/2020   IR FLUORO GUIDE CV LINE LEFT  02/27/2020   IR FLUORO GUIDE CV LINE LEFT  05/30/2020   IR FLUORO GUIDE CV LINE LEFT  10/10/2020   IR FLUORO GUIDE CV LINE LEFT  10/12/2020   IR FLUORO GUIDE CV LINE LEFT  01/17/2021   IR US GUIDE VASC ACCESS LEFT  02/22/2020   KNEE SURGERY     LIGATION OF COMPETING BRANCHES OF ARTERIOVENOUS FISTULA Right 11/14/2020   Procedure: LIGATION OF COMPETING BRANCHES AND SUPERFICIALIZATION OF RIGHT ARTERIOVENOUS FISTULA;  Surgeon: Donzetta Matters,  Georgia Dom, MD;  Location: [place];  Service: Vascular;  Laterality: Right;   TUBAL LIGATION     UPPER EXTREMITY VENOGRAPHY Bilateral 01/14/2021   Procedure: UPPER EXTREMITY VENOGRAPHY;  Surgeon: Waynetta Sandy, MD;  Location: [place] CV [facility];  Service: Cardiovascular;  Laterality: Bilateral;    Current Medications: Current Meds  Medication Sig   acetaminophen (TYLENOL) 325 MG tablet Take 1-2 tablets (325-650 mg total) by mouth every 4 (four) hours as needed for mild pain.   albuterol (VENTOLIN HFA) 108 (90 Base) MCG/ACT inhaler Inhale 1-2 puffs into the lungs every 6 (six) hours as needed for wheezing or shortness of breath.   ALPRAZolam (XANAX) 0.25 MG tablet Take 0.25 mg  by mouth every 6 (six) hours as needed.   aspirin EC 81 MG tablet Take 81 mg by mouth in the morning. Swallow whole.   AURYXIA 1 GM 210 MG(Fe) tablet Take 630 mg by mouth 3 (three) times daily with meals.   B Complex-C (B-COMPLEX WITH VITAMIN C) tablet Take 1 tablet by mouth daily. (Patient taking differently: Take 1 tablet by mouth in the morning.)   Brinzolamide-Brimonidine (SIMBRINZA) 1-0.2 % SUSP Place 1 drop into both eyes in the morning, at noon, and at bedtime.   cinacalcet (SENSIPAR) 30 MG tablet Take 30 mg by mouth daily.   Dulaglutide (TRULICITY) 0.94 BS/9.6GE SOPN Inject 0.75 mg into the skin every Sunday.   fluticasone furoate-vilanterol (BREO ELLIPTA) 100-25 MCG/INH AEPB Inhale 1 puff into the lungs in the morning.   gabapentin (NEURONTIN) 100 MG capsule Take 100 mg by mouth 2 (two) times daily.   insulin aspart (NOVOLOG FLEXPEN) 100 UNIT/ML FlexPen Inject 4 Units into the skin 3 (three) times daily with meals.   insulin glargine (LANTUS) 100 unit/mL SOPN Inject 4 Units into the skin at bedtime. (Patient taking differently: Inject 8 Units into the skin at bedtime.)   levothyroxine (SYNTHROID) 125 MCG tablet Take 1 tablet (125 mcg total) by mouth daily before breakfast.   lip balm (CARMEX) ointment Apply topically as needed for lip care. (Patient taking differently: Apply 1 application topically as needed for lip care.)   Methoxy PEG-Epoetin Beta (MIRCERA IJ) Dialysis Monday,Wednesday and friday   midodrine (PROAMATINE) 10 MG tablet Take 1 tablet (10 mg total) by mouth 3 (three) times daily with meals. (Patient taking differently: Take 10 mg by mouth 3 (three) times daily as needed (low blood pressure).)   montelukast (SINGULAIR) 10 MG tablet Take 10 mg by mouth in the morning.   Netarsudil Dimesylate (RHOPRESSA) 0.02 % SOLN Place 1 drop into both eyes at bedtime.   Oxycodone HCl 10 MG TABS Take 10 mg by mouth 4 (four) times daily as needed (pain).   oxyCODONE-acetaminophen (PERCOCET)  5-325 MG tablet Take 1 tablet by mouth every 6 (six) hours as needed for severe pain.   OXYGEN Inhale 5 L into the lungs continuous.   pantoprazole (PROTONIX) 40 MG tablet Take 1 tablet (40 mg total) by mouth daily. (Patient taking differently: Take 40 mg by mouth in the morning.)   QUEtiapine (SEROQUEL) 50 MG tablet Take 1 tablet (50 mg total) by mouth at bedtime.     Allergies:   Hydrocodone   Social History   Socioeconomic History   Marital status: Single    Spouse name: Not on file   Number of children: Not on file   Years of education: Not on file   Highest education level: Not on file  Occupational History  Occupation: disabled  Tobacco Use   Smoking status: Former    Packs/day: 0.50    Years: 25.00    Pack years: 12.50    Types: Cigarettes   Smokeless tobacco: Never   Tobacco comments:    Smoked x 2 yrs  Vaping Use   Vaping Use: Never used  Substance and Sexual Activity   Alcohol use: No    Comment: years ago   Drug use: No   Sexual activity: Not on file  Other Topics Concern   Not on file  Social History Narrative   Pt lives alone in 2 story home (has 2 aides that occasionally stay the night)   Has 4 children   11th grade education   Has never been gainfully employed   Social Determinants of Radio broadcast assistant Strain: Not on file  Food Insecurity: Not on file  Transportation Needs: Not on file  Physical Activity: Not on file  Stress: Not on file  Social Connections: Not on file     Family History: The patient's family history includes Diabetes in her mother; Hyperlipidemia in her mother; Kidney disease in her father.  ROS:   ROS Please see the history of present illness.     All other systems reviewed and are negative.  EKGs/Labs/Other Studies Reviewed:    The following studies were reviewed today:   Recent Labs: 03/06/2021: BUN 58; Creatinine, Ser 12.03; Hemoglobin 10.8; Platelets 289; Potassium 4.1; Sodium 134  Recent Lipid Panel     Component Value Date/Time   CHOL 192 02/09/2018 0118   TRIG 395 (H) 02/14/2020 0833   HDL 30 (L) 02/09/2018 0118   CHOLHDL 6.4 02/09/2018 0118   VLDL 75 (H) 02/09/2018 0118   LDLCALC 87 02/09/2018 0118    Physical Exam:    VS:  BP (!) 148/90   Pulse 92   Ht 5\' 2"  (1.575 m)   Wt 211 lb 3.2 oz (95.8 kg)   SpO2 95%   BMI 38.63 kg/m     Wt Readings from Last 3 Encounters:  04/16/21 211 lb 3.2 oz (95.8 kg)  03/06/21 216 lb 0.8 oz (98 kg)  01/23/21 212 lb (96.2 kg)     GEN:  Well nourished, well developed in no acute distress HEENT: Normal NECK: No JVD; No carotid bruits LYMPHATICS: No lymphadenopathy CARDIAC: RRR, no murmurs, rubs, gallops RESPIRATORY:  Clear to auscultation without rales, wheezing or rhonchi  ABDOMEN: Soft, non-tender, non-distended MUSCULOSKELETAL:  No edema; No deformity  SKIN: Warm and dry NEUROLOGIC:  Alert and oriented x 3 PSYCHIATRIC:  Normal affect     Signed, Shirlee More, MD  04/16/2021 3:09 PM    [facility]

## 2021-04-16 ENCOUNTER — Other Ambulatory Visit: Payer: Self-pay

## 2021-04-16 ENCOUNTER — Ambulatory Visit (INDEPENDENT_AMBULATORY_CARE_PROVIDER_SITE_OTHER): Payer: Medicaid Other | Admitting: Cardiology

## 2021-04-16 ENCOUNTER — Encounter: Payer: Self-pay | Admitting: Cardiology

## 2021-04-16 VITALS — BP 148/90 | HR 92 | Ht 62.0 in | Wt 211.2 lb

## 2021-04-16 DIAGNOSIS — R931 Abnormal findings on diagnostic imaging of heart and coronary circulation: Secondary | ICD-10-CM

## 2021-04-16 DIAGNOSIS — I959 Hypotension, unspecified: Secondary | ICD-10-CM | POA: Diagnosis not present

## 2021-04-16 DIAGNOSIS — I120 Hypertensive chronic kidney disease with stage 5 chronic kidney disease or end stage renal disease: Secondary | ICD-10-CM

## 2021-04-16 DIAGNOSIS — N185 Chronic kidney disease, stage 5: Secondary | ICD-10-CM

## 2021-04-16 NOTE — Patient Instructions (Signed)
Medication Instructions:  Your physician recommends that you continue on your current medications as directed. Please refer to the Current Medication list given to you today.  *If you need a refill on your cardiac medications before your next appointment, please call your pharmacy*   Lab Work: None If you have labs (blood work) drawn today and your tests are completely normal, you will receive your results only by: [place] (if you have MyChart) OR A paper copy in the mail If you have any lab test that is abnormal or we need to change your treatment, we will call you to review the results.   Testing/Procedures: None   Follow-Up: At [facility], you and your health needs are our priority.  As part of our continuing mission to provide you with exceptional heart care, we have created designated Provider Care Teams.  These Care Teams include your primary Cardiologist (physician) and Advanced Practice Providers (APPs -  Physician Assistants and Nurse Practitioners) who all work together to provide you with the care you need, when you need it.  We recommend signing up for the patient portal called "MyChart".  Sign up information is provided on this After Visit Summary.  MyChart is used to connect with patients for Virtual Visits (Telemedicine).  Patients are able to view lab/test results, encounter notes, upcoming appointments, etc.  Non-urgent messages can be sent to your provider as well.   To learn more about what you can do with MyChart, go to [email or web address].    Your next appointment:   6 week(s)  The format for your next appointment:   In Person  Provider:   Shirlee More, MD    Other Instructions

## 2021-05-07 ENCOUNTER — Other Ambulatory Visit: Payer: Self-pay

## 2021-05-07 ENCOUNTER — Ambulatory Visit (INDEPENDENT_AMBULATORY_CARE_PROVIDER_SITE_OTHER): Payer: Medicaid Other

## 2021-05-07 DIAGNOSIS — R931 Abnormal findings on diagnostic imaging of heart and coronary circulation: Secondary | ICD-10-CM

## 2021-05-07 MED ORDER — PERFLUTREN LIPID MICROSPHERE
1.0000 mL | INTRAVENOUS | Status: AC | PRN
  Administered 2021-05-07: 8 mL via INTRAVENOUS

## 2021-05-09 LAB — ECHOCARDIOGRAM COMPLETE
Area-P 1/2: 3.68 cm2
S' Lateral: 3.3 cm

## 2021-05-10 ENCOUNTER — Telehealth: Payer: Self-pay

## 2021-05-10 NOTE — Telephone Encounter (Signed)
Spoke with patient regarding results and recommendation.  Patient verbalizes understanding and is agreeable to plan of care. Advised patient to call back with any issues or concerns.  

## 2021-05-10 NOTE — Telephone Encounter (Signed)
-----   Message from Richardo Priest, MD sent at 05/09/2021  5:22 PM EST ----- Good result better images there is no findings of noncompaction cardiomyopathy and she does not need a cardiac MRI performed.

## 2021-06-11 ENCOUNTER — Encounter: Payer: Self-pay | Admitting: Cardiology

## 2021-06-11 ENCOUNTER — Ambulatory Visit (INDEPENDENT_AMBULATORY_CARE_PROVIDER_SITE_OTHER): Payer: Medicaid Other | Admitting: Cardiology

## 2021-06-11 ENCOUNTER — Ambulatory Visit: Payer: Medicaid Other | Admitting: Cardiology

## 2021-06-11 ENCOUNTER — Other Ambulatory Visit: Payer: Self-pay

## 2021-06-11 VITALS — BP 120/90 | HR 82 | Ht 62.0 in | Wt 206.0 lb

## 2021-06-11 DIAGNOSIS — I1311 Hypertensive heart and chronic kidney disease without heart failure, with stage 5 chronic kidney disease, or end stage renal disease: Secondary | ICD-10-CM | POA: Diagnosis not present

## 2021-06-11 DIAGNOSIS — R931 Abnormal findings on diagnostic imaging of heart and coronary circulation: Secondary | ICD-10-CM | POA: Diagnosis not present

## 2021-06-11 DIAGNOSIS — Z992 Dependence on renal dialysis: Secondary | ICD-10-CM

## 2021-06-11 DIAGNOSIS — N186 End stage renal disease: Secondary | ICD-10-CM

## 2021-06-11 DIAGNOSIS — N185 Chronic kidney disease, stage 5: Secondary | ICD-10-CM

## 2021-06-11 NOTE — Patient Instructions (Signed)
Medication Instructions:  Your physician recommends that you continue on your current medications as directed. Please refer to the Current Medication list given to you today.  *If you need a refill on your cardiac medications before your next appointment, please call your pharmacy*   Lab Work: None If you have labs (blood work) drawn today and your tests are completely normal, you will receive your results only by: [place] (if you have MyChart) OR A paper copy in the mail If you have any lab test that is abnormal or we need to change your treatment, we will call you to review the results.   Testing/Procedures: None   Follow-Up: At [facility], you and your health needs are our priority.  As part of our continuing mission to provide you with exceptional heart care, we have created designated Provider Care Teams.  These Care Teams include your primary Cardiologist (physician) and Advanced Practice Providers (APPs -  Physician Assistants and Nurse Practitioners) who all work together to provide you with the care you need, when you need it.  We recommend signing up for the patient portal called "MyChart".  Sign up information is provided on this After Visit Summary.  MyChart is used to connect with patients for Virtual Visits (Telemedicine).  Patients are able to view lab/test results, encounter notes, upcoming appointments, etc.  Non-urgent messages can be sent to your provider as well.   To learn more about what you can do with MyChart, go to [email or web address].    Your next appointment:    Follow up as needed  The format for your next appointment:   In Person  Provider:   Shirlee More, MD    Other Instructions None

## 2021-06-11 NOTE — Progress Notes (Signed)
Cardiology Office Note:    Date:  06/11/2021   ID:  [name], DOB [birth year], MRN [id number]  PCP:  Darrol Jump, NP  Cardiologist:  Shirlee More, MD    Referring MD: Darrol Jump, NP    ASSESSMENT:    1. Abnormal echocardiogram   2. End stage renal disease on dialysis ([place])   3. 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  Concerns regarding medicines are outlined above.  No orders of the defined types were placed in this encounter.  No orders of the defined types were placed in this encounter.   Chief Complaint  Patient presents with   Follow-up    Cardiac echo recently    History of Present Illness:    Wanda Ramirez is a 51 y.o. 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 Compliance with diet, lifestyle and medications: Yes  She continues to do well tolerating hemodialysis without hypotension She has had no chest pain edema shortness of breath palpitation or syncope. I reviewed with her that she has hypertensive heart disease with hypertrophy and does not have a noncompaction cardiomyopathy. She tells me she is considering renal transplantation. She has had previous normal  coronary arteriography. Her echocardiogram shows no findings to suggest preceding myocardial infarction that was suggested on her previous perfusion study through [facility].  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  1. Increase trabeculation v/s mass noted in the apex of LV, especially in  apical latereral wall in 2D imaging similar to findings on echo from  October. However definity study was normal without non-compaction or  thrombus. Left ventricular ejection  fraction, by estimation, is 60 to 65%. The left ventricle has normal  function. The left ventricle has no regional wall motion abnormalities.  There is mild left ventricular hypertrophy. Left ventricular diastolic  parameters are consistent with Grade I  diastolic dysfunction (impaired relaxation).   2. Right ventricular systolic function is normal. The right ventricular  size is normal.   3. The mitral valve is normal in structure. No evidence of mitral valve  regurgitation. No evidence of mitral stenosis.   4. The aortic valve is normal in structure. Aortic valve regurgitation is  not visualized. No aortic stenosis is present.   5. The inferior vena cava is normal in size with greater than 50%  respiratory variability, suggesting right atrial pressure of 3 mmHg.  Past Medical History:  Diagnosis Date   Anemia    Anxiety    Arthritis    Asthma    Chronic back pain    Diabetes mellitus (HCC)    Type II   Dyspnea  with exertion - 4L oxygen via [place]   ESRD (end stage renal disease) on dialysis ([place])    [facility] since 2016   Essential hypertension    GERD (gastroesophageal reflux disease)    Glaucoma    History of blood transfusion    Hyperlipidemia    Hypothyroidism    Morbid obesity  ([place])    Myocardial infarction ([place])    mild per patient   Peripheral neuropathy    left foot   Requires continuous at home supplemental oxygen    4L via [place]   Sleep apnea    wears CPAP, does not know setting    Past Surgical History:  Procedure Laterality Date   AV FISTULA PLACEMENT Left 04/2014   AV FISTULA PLACEMENT Right 08/15/2020   Procedure: RIGHT BRACHIOCEPHALIC ARTERIOVENOUS (AV) FISTULA CREATION;  Surgeon: Waynetta Sandy, MD;  Location: [facility] [place];  Service: Vascular;  Laterality: Right;   AV FISTULA PLACEMENT Right 01/23/2021   Procedure: INSERTION OF RIGHT UPPER ARM ARTERIOVENOUS (AV) GORE-TEX GRAFT, 4-7 mm x 45 cm STRETCH VASCULAR GRAFT;  Surgeon: Serafina Mitchell, MD;  Location: [place];  Service: Cardiovascular;  Laterality: Right;   CARDIAC CATHETERIZATION N/A 03/17/2016   Procedure: Left Heart Cath and Coronary Angiography;  Surgeon: [name], MD;  Location: [place] [facility];  Service: Cardiovascular;  Laterality: N/A;   INCISION AND DRAINAGE PERIRECTAL ABSCESS N/A 04/03/2020   Procedure: EXCISIONAL DEBRIDEMENT OF SACRAL AND GLUTEAL WOUNDS;  Surgeon: [name], MD;  Location: [place];  Service: General;  Laterality: N/A;   IR FLUORO GUIDE CV LINE LEFT  02/22/2020   IR FLUORO GUIDE CV LINE LEFT  02/27/2020   IR FLUORO GUIDE CV LINE LEFT  05/30/2020   IR FLUORO GUIDE CV LINE LEFT  10/10/2020   IR FLUORO GUIDE CV LINE LEFT  10/12/2020   IR FLUORO GUIDE CV LINE LEFT  01/17/2021   IR US GUIDE VASC ACCESS LEFT  02/22/2020   KNEE SURGERY     LIGATION OF COMPETING BRANCHES OF ARTERIOVENOUS FISTULA Right 11/14/2020   Procedure: LIGATION OF COMPETING BRANCHES AND SUPERFICIALIZATION OF RIGHT ARTERIOVENOUS FISTULA;  Surgeon: Waynetta Sandy, MD;  Location: [place];  Service: Vascular;  Laterality: Right;   TUBAL LIGATION     UPPER EXTREMITY VENOGRAPHY Bilateral 01/14/2021   Procedure: UPPER EXTREMITY VENOGRAPHY;  Surgeon: Waynetta Sandy, MD;   Location: [place] [facility];  Service: Cardiovascular;  Laterality: Bilateral;    Current Medications: Current Meds  Medication Sig   acetaminophen (TYLENOL) 325 MG tablet Take 1-2 tablets (325-650 mg total) by mouth every 4 (four) hours as needed for mild pain.   albuterol (VENTOLIN HFA) 108 (90 Base) MCG/ACT inhaler Inhale 1-2 puffs into the lungs every 6 (six) hours as needed for wheezing or shortness of breath.   ALPRAZolam (XANAX) 0.25 MG tablet Take 0.25 mg by mouth every 6 (six) hours as needed for anxiety.   aspirin EC 81 MG tablet Take 81 mg by mouth in the morning. Swallow whole.   AURYXIA 1 GM 210 MG(Fe) tablet Take 630 mg by mouth 3 (three) times daily with meals.   B Complex-C (B-COMPLEX WITH VITAMIN C) tablet Take 1 tablet by mouth daily.   Brinzolamide-Brimonidine (SIMBRINZA) 1-0.2 % SUSP Place 1 drop into both eyes in the morning, at noon, and at bedtime.   cinacalcet (SENSIPAR) 30 MG tablet Take 30 mg by mouth daily.   Dulaglutide (TRULICITY) 7.32 KG/2.5KY  SOPN Inject 0.75 mg into the skin every Sunday.   fluticasone furoate-vilanterol (BREO ELLIPTA) 100-25 MCG/INH AEPB Inhale 1 puff into the lungs in the morning.   gabapentin (NEURONTIN) 100 MG capsule Take 100 mg by mouth 2 (two) times daily.   insulin aspart (NOVOLOG FLEXPEN) 100 UNIT/ML FlexPen Inject 4 Units into the skin 3 (three) times daily with meals.   insulin glargine (LANTUS) 100 UNIT/ML injection Inject 8 Units into the skin at bedtime.   levothyroxine (SYNTHROID) 125 MCG tablet Take 1 tablet (125 mcg total) by mouth daily before breakfast.   lip balm (CARMEX) ointment Apply topically as needed for lip care.   Methoxy PEG-Epoetin Beta (MIRCERA IJ) Dialysis Monday,Wednesday and friday   midodrine (PROAMATINE) 10 MG tablet Take 10 mg by mouth daily as needed (low blood pressure).   montelukast (SINGULAIR) 10 MG tablet Take 10 mg by mouth in the morning.   Netarsudil Dimesylate (RHOPRESSA) 0.02 % SOLN Place 1 drop  into both eyes at bedtime.   Oxycodone HCl 10 MG TABS Take 10 mg by mouth 4 (four) times daily as needed (pain).   oxyCODONE-acetaminophen (PERCOCET) 5-325 MG tablet Take 1 tablet by mouth every 6 (six) hours as needed for severe pain.   OXYGEN Inhale 5 L into the lungs continuous.   pantoprazole (PROTONIX) 40 MG tablet Take 1 tablet (40 mg total) by mouth daily.   QUEtiapine (SEROQUEL) 50 MG tablet Take 1 tablet (50 mg total) by mouth at bedtime.     Allergies:   Hydrocodone   Social History   Socioeconomic History   Marital status: Single    Spouse name: Not on file   Number of children: Not on file   Years of education: Not on file   Highest education level: Not on file  Occupational History   Occupation: disabled  Tobacco Use   Smoking status: Former    Packs/day: 0.50    Years: 25.00    Pack years: 12.50    Types: Cigarettes   Smokeless tobacco: Never   Tobacco comments:    Smoked x 2 yrs  Vaping Use   Vaping Use: Never used  Substance and Sexual Activity   Alcohol use: No    Comment: years ago   Drug use: No   Sexual activity: Not on file  Other Topics Concern   Not on file  Social History Narrative   Pt lives alone in 2 story home (has 2 aides that occasionally stay the night)   Has 4 children   11th grade education   Has never been gainfully employed   Social Determinants of Radio broadcast assistant Strain: Not on file  Food Insecurity: Not on file  Transportation Needs: Not on file  Physical Activity: Not on file  Stress: Not on file  Social Connections: Not on file     Family History: The patient's family history includes Diabetes in her mother; Hyperlipidemia in her mother; Kidney disease in her father. ROS:   Please see the history of present illness.    All other systems reviewed and are negative.  EKGs/Labs/Other Studies Reviewed:    The following studies were reviewed today: Coronary angiography performed October 2017 with normal  coronary arteriography normal left ventricular systolic function. Conclusion The left ventricular systolic function is normal. LV end diastolic pressure is normal. The left ventricular ejection fraction is 55-65% by visual estimate. There is no mitral valve regurgitation.   1. No angiographic evidence of CAD 2. Normal LV  systolic function 3. Non-cardiac chest pain  Recent Labs: 03/06/2021: BUN 58; Creatinine, Ser 12.03; Hemoglobin 10.8; Platelets 289; Potassium 4.1; Sodium 134  Recent Lipid Panel    Component Value Date/Time   CHOL 192 02/09/2018 0118   TRIG 395 (H) 02/14/2020 0833   HDL 30 (L) 02/09/2018 0118   CHOLHDL 6.4 02/09/2018 0118   VLDL 75 (H) 02/09/2018 0118   LDLCALC 87 02/09/2018 0118    Physical Exam:    VS:  BP 120/90 (BP Location: Left Arm, Patient Position: Sitting, Cuff Size: Normal)    Pulse 82    Ht 5\' 2"  (1.575 m)    Wt 206 lb (93.4 kg)    SpO2 93%    BMI 37.68 kg/m     Wt Readings from Last 3 Encounters:  06/11/21 206 lb (93.4 kg)  04/16/21 211 lb 3.2 oz (95.8 kg)  03/06/21 216 lb 0.8 oz (98 kg)     GEN:  Well nourished, well developed in no acute distress HEENT: Normal NECK: No JVD; No carotid bruits LYMPHATICS: No lymphadenopathy CARDIAC: RRR, no murmurs, rubs, gallops RESPIRATORY:  Clear to auscultation without rales, wheezing or rhonchi  ABDOMEN: Soft, non-tender, non-distended MUSCULOSKELETAL:  No edema; No deformity  SKIN: Warm and dry NEUROLOGIC:  Alert and oriented x 3 PSYCHIATRIC:  Normal affect    Signed, Shirlee More, MD  06/11/2021 8:42 AM    [place]

## 2021-09-03 ENCOUNTER — Ambulatory Visit: Payer: Medicaid Other | Admitting: Cardiology

## 2021-09-18 IMAGING — XA IR FLUORO GUIDE CV LINE*L*
2 series · 4 of 4 positions shown · non-contrast
Comparison: none

INDICATION: 50-year-old female referred for exchange of nonfunctional
hemodialysis catheter

[Series 1: fl (-) angio · 2 of 2 slices shown (1 of 2)]
[im 1/2]
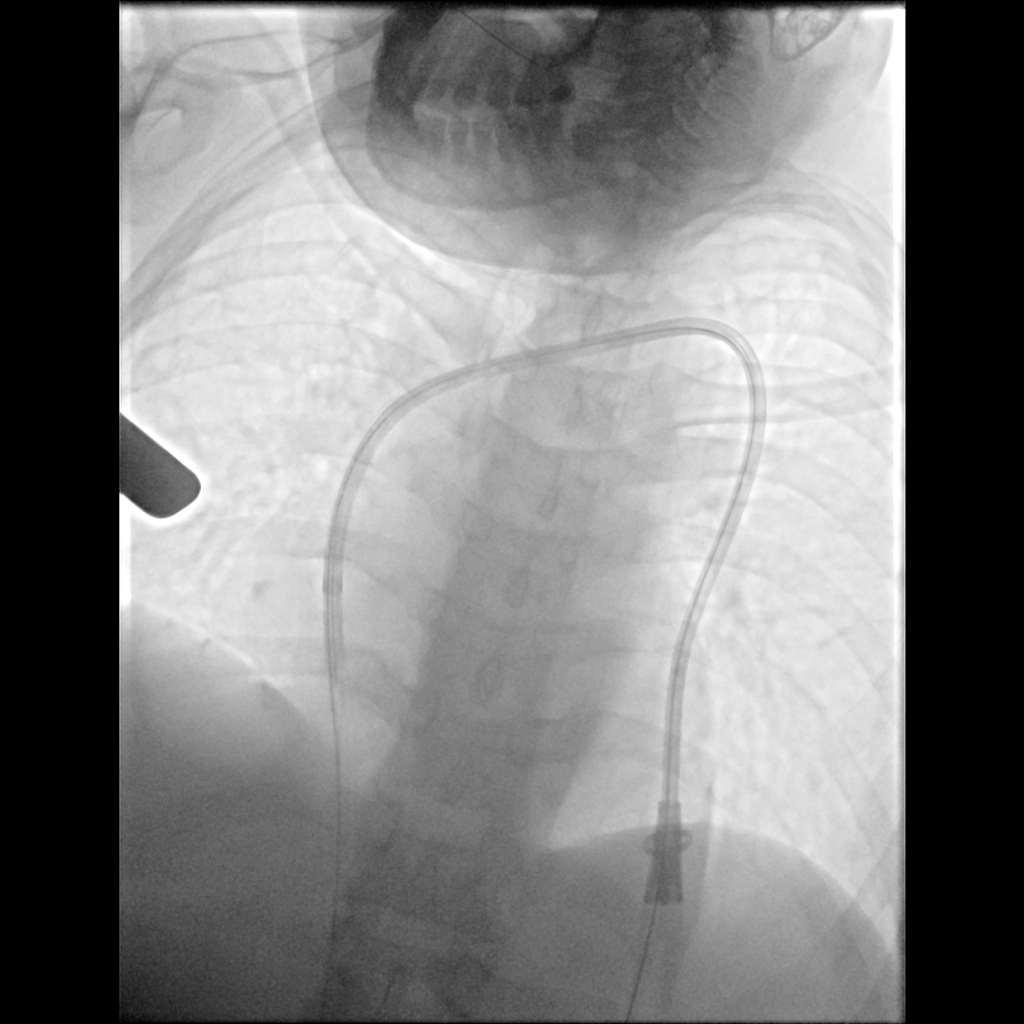
[im 2/2]
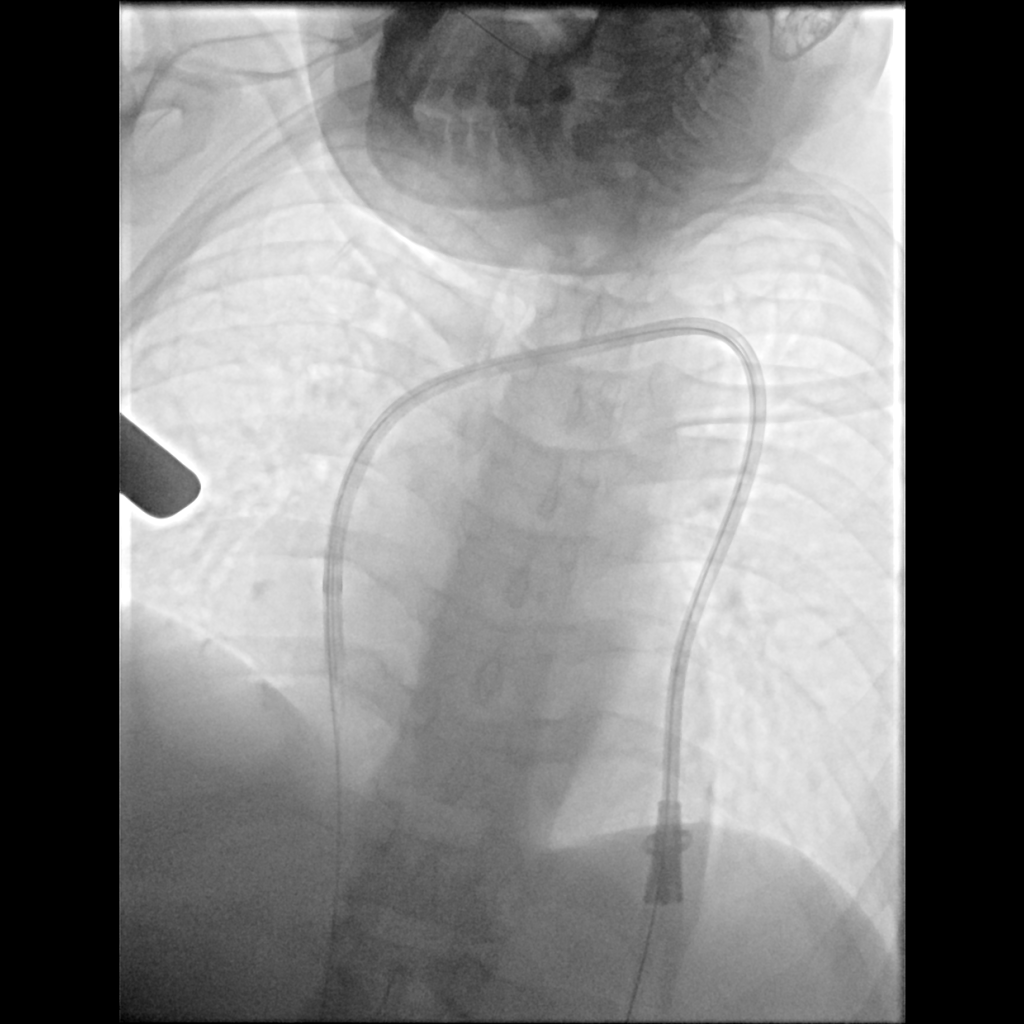

[Series 2: fl (-) angio · 2 of 2 slices shown (2 of 2)]
[im 1/2]
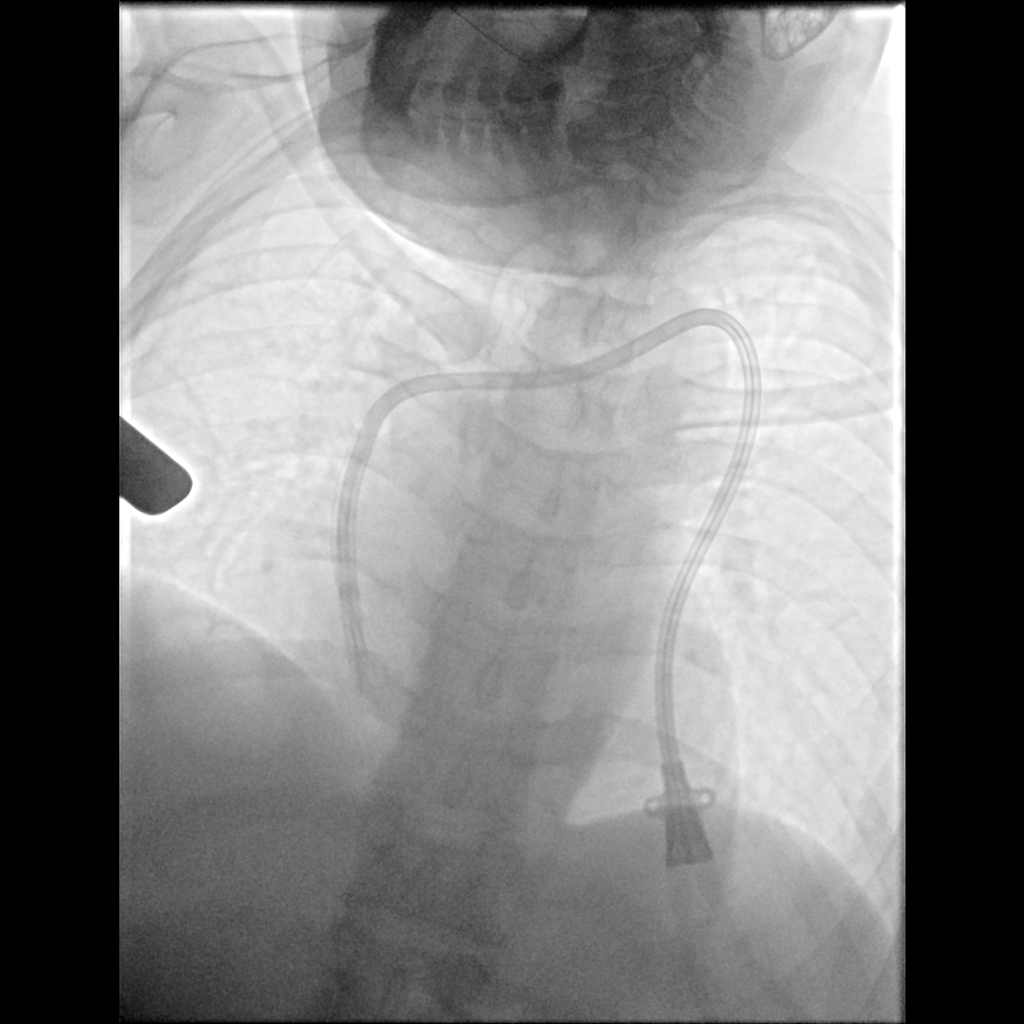
[im 2/2]
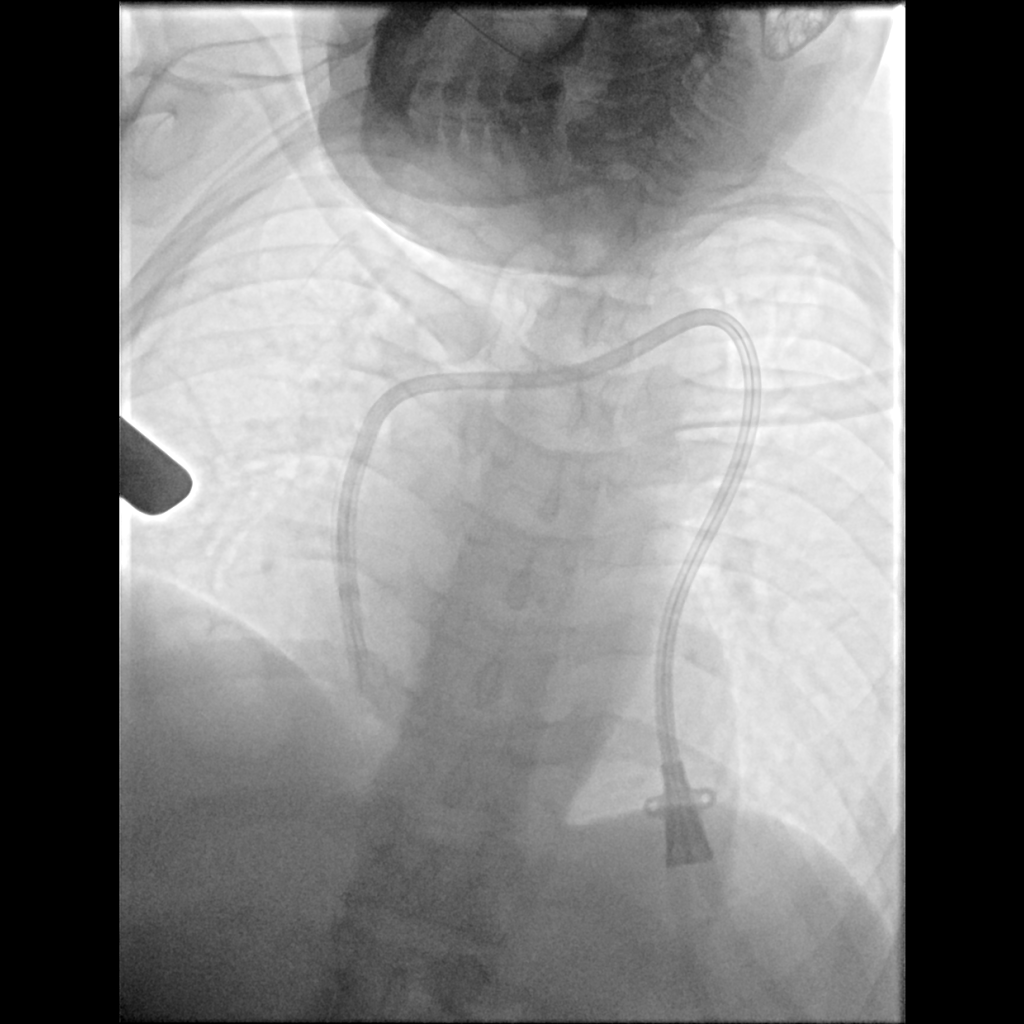

[4 of 4 positions shown; findings below may reference images not displayed]

EXAM:
IMAGE GUIDED REPLACEMENT OF TUNNELED HEMODIALYSIS CATHETER

MEDICATIONS:
None

ANESTHESIA/SEDATION:
No conscious sedation the patient's level of consciousness and vital
signs were monitored continuously by radiology nursing throughout
the procedure under my direct supervision.

FLUOROSCOPY TIME:  Fluoroscopy Time: 0 minutes 24 seconds (4 mGy).

COMPLICATIONS:
None

PROCEDURE:
Informed written consent was obtained from the patient after a
discussion of the risks, benefits, and alternatives to treatment.
Questions regarding the procedure were encouraged and answered. The
left neck and chest were prepped with chlorhexidine in a sterile
fashion, and a sterile drape was applied covering the operative
field. Maximum barrier sterile technique with sterile gowns and
gloves were used for the procedure. A timeout was performed prior to
the initiation of the procedure.

1% lidocaine was used for local anesthesia on the left chest wall
along the tract. Blunt dissection was used to free the catheter at
the tissue tract.

A an exchange stiff Glidewire was then advanced through the
catheter, through the tip of the indwelling catheter into the IVC.
Once the catheter was within the IVC, the catheter was removed on
the wire.

We elected to place a 28 cm tip to cuff catheter given get the
indwelling 23 cm catheter terminated within the azygos vein. The
catheter was advanced on the Glidewire. The introducer of both lumen
were reviewed moved, wire was removed, the lumen were aspirated and
flushed with sterile heparin. Hep-Lock was placed through each
lumen. Final catheter positioning was confirmed and documented with
a spot radiographic image. The catheter aspirates and flushes
normally. The catheter was flushed with appropriate volume heparin
dwells.

The catheter exit site was secured with a 0-Prolene retention
suture. Dressings were applied. The patient tolerated the procedure
well without immediate post procedural complication.
IMPRESSION: Status post exchange of left-sided tunneled hemodialysis catheter,
with placement of a new 28 cm tip to cuff catheter. The previous 23
cm catheter was nonfunctional given the tip had withdrawn into the
azygos vein.

## 2021-11-03 IMAGING — CT CT HEAD W/O CM
4 series · 17 of 47 positions shown, 19 images · non-contrast
Comparison: 11/10/2017

CLINICAL DATA: Cerebral hemorrhage suspected. Weakness during
dialysis

EXAM:
CT HEAD WITHOUT CONTRAST
TECHNIQUE: Contiguous axial images were obtained from the base of the skull
through the vertex without intravenous contrast.

[Series 3: head without · axial · non-contrast · 0.42mm/px · z∈[+1207,+1327]mm · 7 of 33 slices shown, 9 images]
[im 5/33  brain]
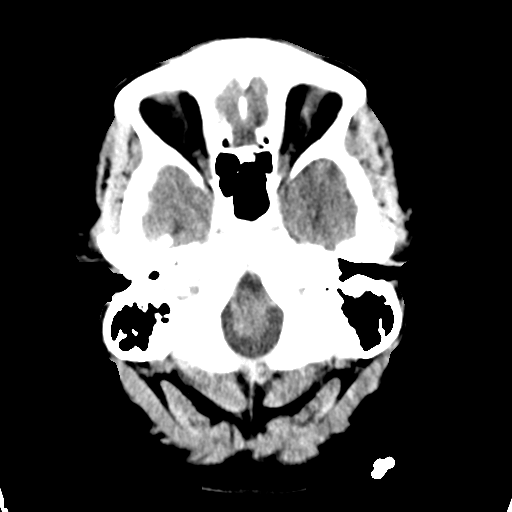
[im 5/33  bone]
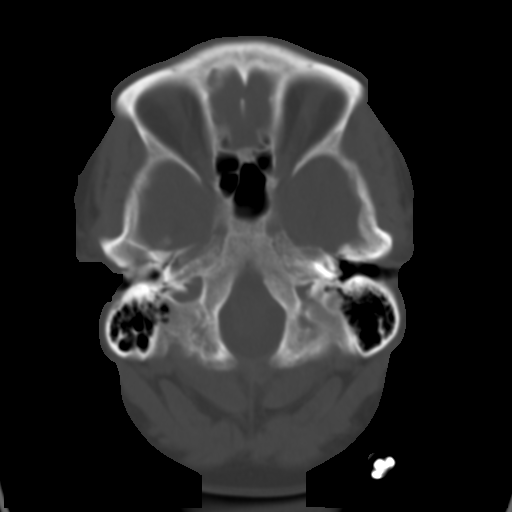
[im 9/33  brain]
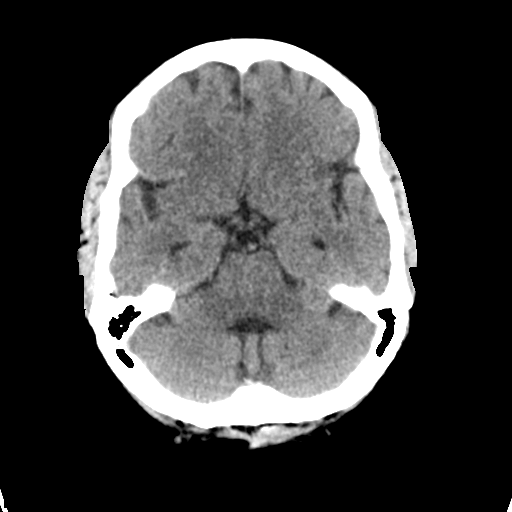
[im 13/33  brain]
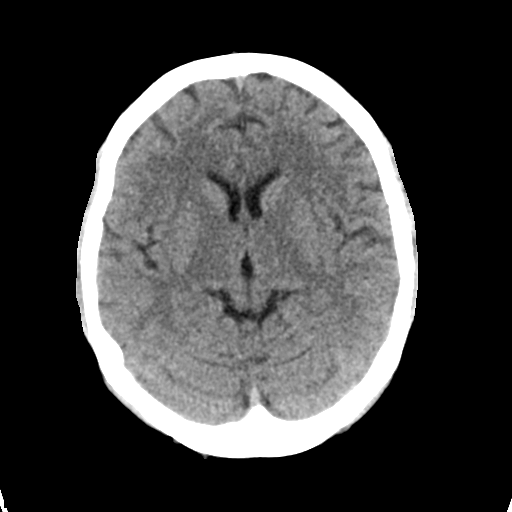
[im 17/33  brain]
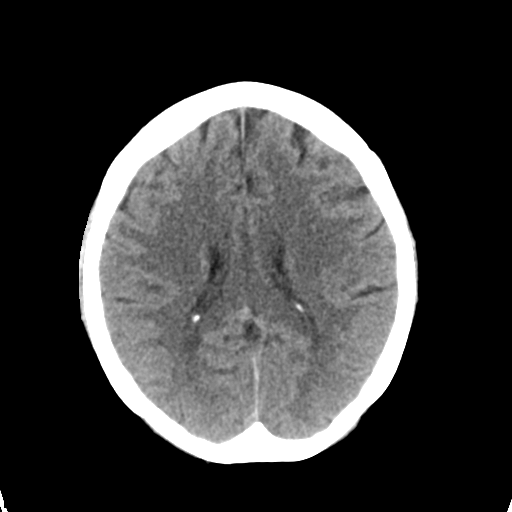
[im 21/33  brain]
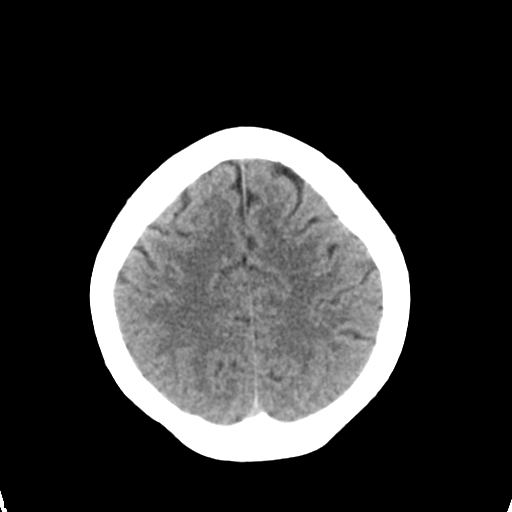
[im 21/33  bone]
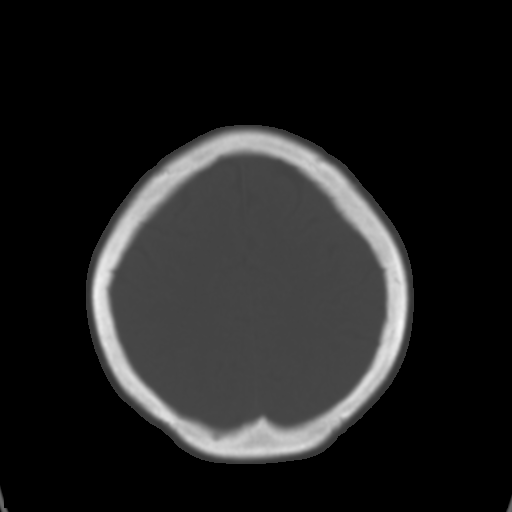
[im 25/33  brain]
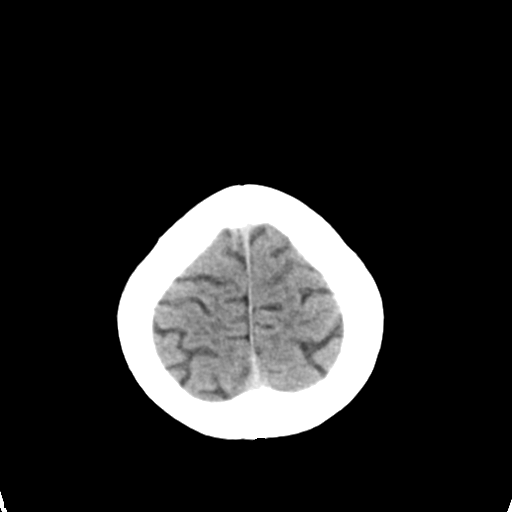
[im 29/33  brain]
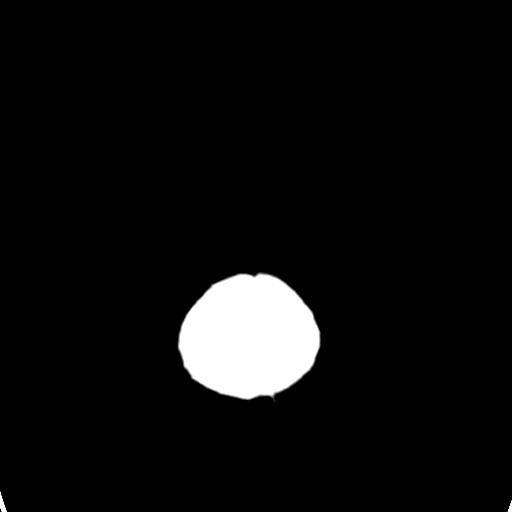

[Series 4: head bone · axial · 0.42mm/px · z∈[+1203,+1259]mm · 4 of 82 slices shown]
[im 9/82  bone]
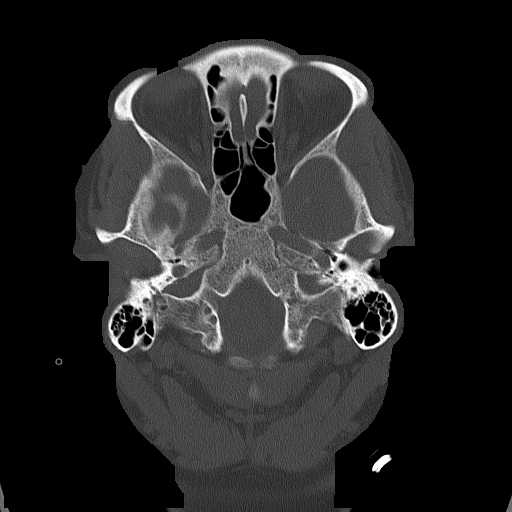
[im 17/82  bone]
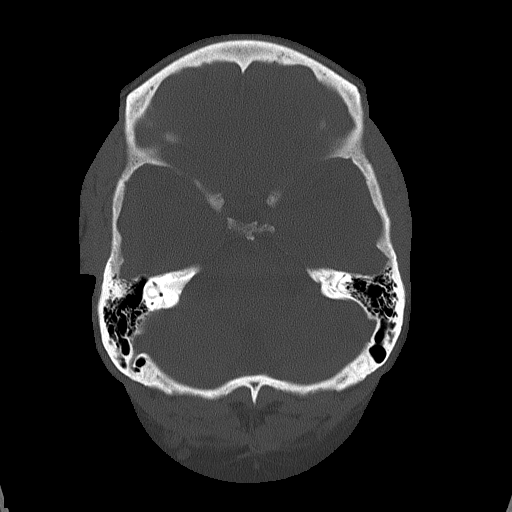
[im 25/82  bone]
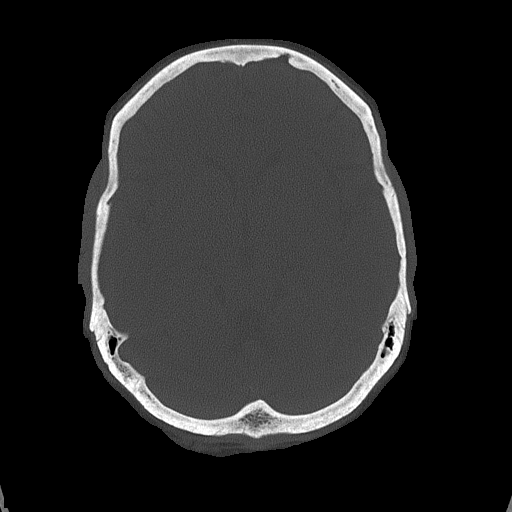
[im 37/82  bone]
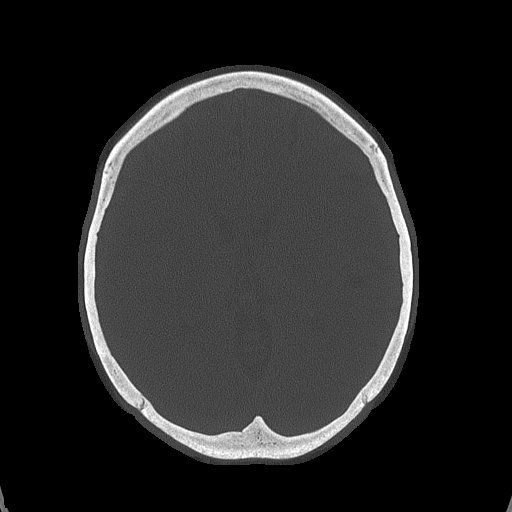

[Series 5: head without cor · coronal · non-contrast · 0.32mm/px · 3 of 61 slices shown]
[im 21/61  brain]
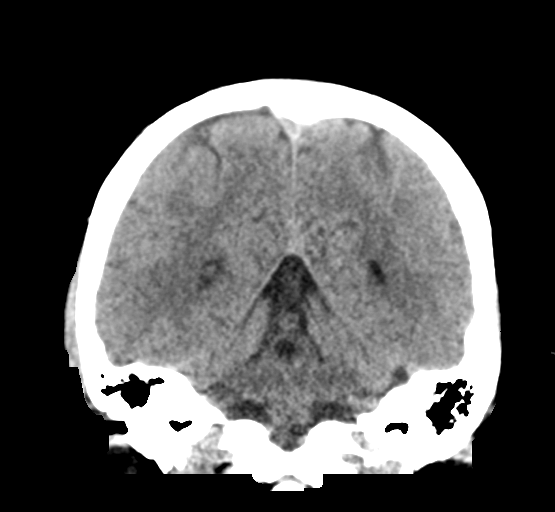
[im 27/61  brain]
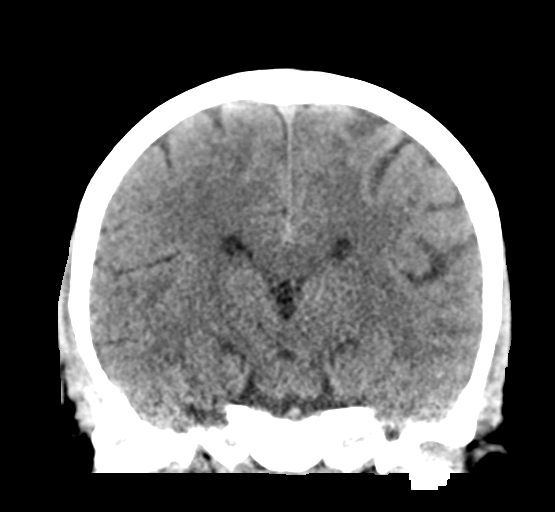
[im 34/61  brain]
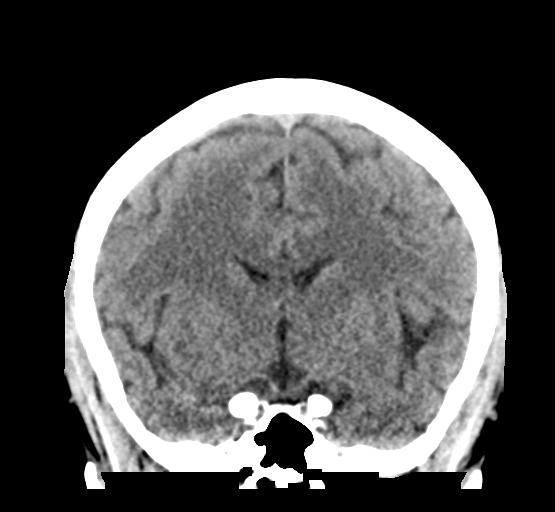

[Series 6: head without sag · sagittal · non-contrast · 0.31mm/px · 3 of 49 slices shown]
[im 17/49  brain]
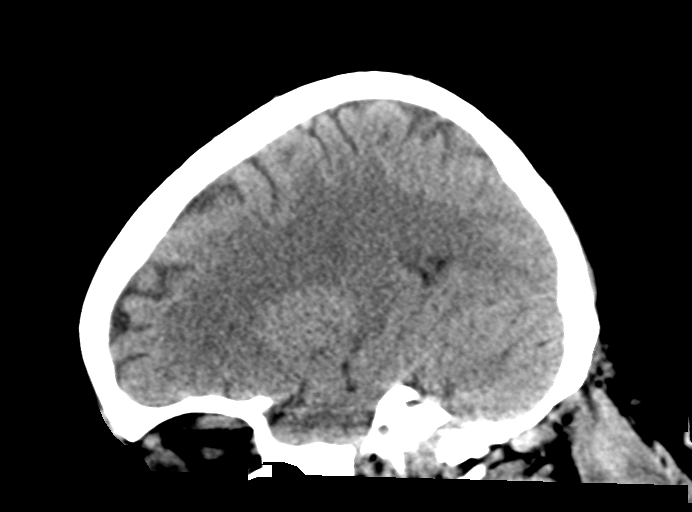
[im 25/49  brain]
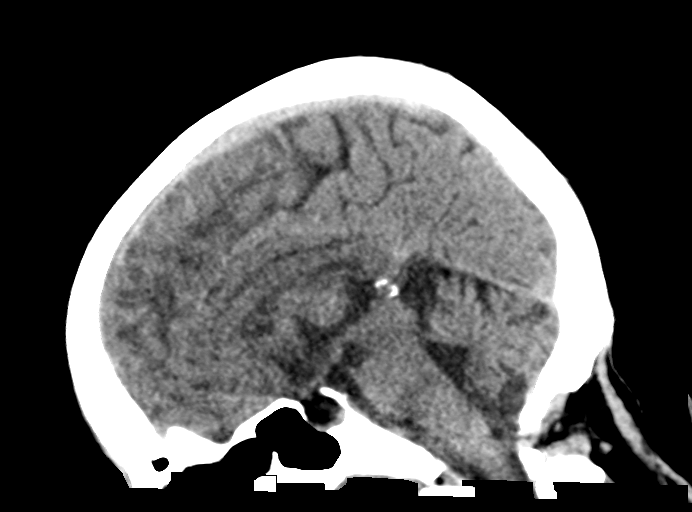
[im 33/49  brain]
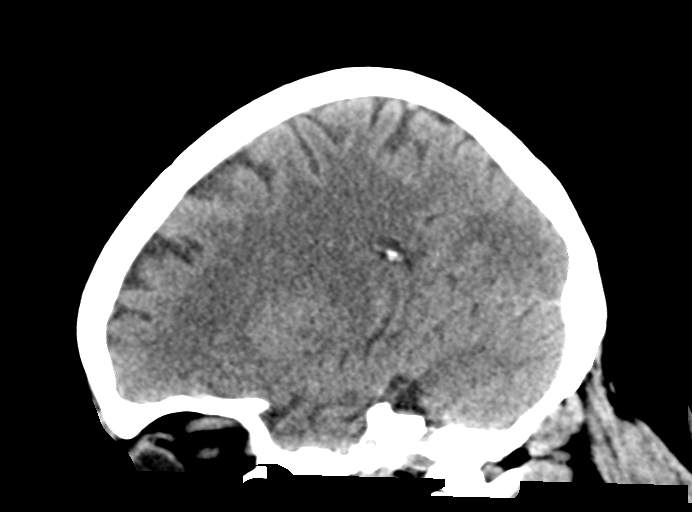

[17 of 47 positions shown; findings below may reference images not displayed]

FINDINGS: Brain: No evidence of acute infarction, hemorrhage, hydrocephalus,
extra-axial collection or mass lesion/mass effect.

Vascular: No hyperdense vessel or unexpected calcification.

Skull: Normal. Negative for fracture or focal lesion.

Sinuses/Orbits: No acute finding.

Other: None.
IMPRESSION: No acute intracranial findings.

## 2021-11-26 ENCOUNTER — Other Ambulatory Visit: Payer: Self-pay | Admitting: *Deleted

## 2021-11-26 ENCOUNTER — Encounter: Payer: Self-pay | Admitting: Podiatrist

## 2021-11-26 ENCOUNTER — Ambulatory Visit: Payer: Medicaid Other | Admitting: Podiatrist

## 2021-11-26 DIAGNOSIS — E1142 Type 2 diabetes mellitus with diabetic polyneuropathy: Secondary | ICD-10-CM | POA: Diagnosis not present

## 2021-11-26 DIAGNOSIS — L602 Onychogryphosis: Secondary | ICD-10-CM

## 2021-11-26 DIAGNOSIS — B351 Tinea unguium: Secondary | ICD-10-CM

## 2021-11-26 DIAGNOSIS — M79609 Pain in unspecified limb: Secondary | ICD-10-CM

## 2021-11-26 NOTE — Progress Notes (Signed)
Chief Complaint  Patient presents with   Nail Problem    Trim nails and check my feet and I am a diabetic     HPI: Patient is 51 y.o. female who presents today for long toenails. She is diabetic and has neuropathy in the left greater than right foot. Her primary care provider is Dr. Gordy Clement whom she saw on June 22,2023.  Dr. Jimmye Norman recommended Carmelia Bake see a podiatrist for a foot check and to have her nails trimmed.    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 ([place]) 06/11/2021   Anxiety 04/15/2021   Asthma 04/15/2021   Chronic back pain 04/15/2021   Diabetes mellitus ([place]) 04/15/2021   History of blood transfusion 04/15/2021   Requires continuous at home supplemental oxygen 04/15/2021   Pneumonia 03/05/2021   Syncope 03/04/2021   CAP (community acquired pneumonia) 03/04/2021   Chills (without fever) 01/07/2021   Allergy, unspecified, initial encounter 11/27/2020   Anaphylactic shock, unspecified, initial encounter 62/13/0865   Complication associated with dialysis catheter 10/12/2020   Anemia 09/20/2020   CKD (chronic kidney disease) stage 5, GFR less than 15 ml/min ([place]) 09/20/2020   ESRD on hemodialysis ([place]) 09/20/2020   Renal disorder 09/20/2020   Mild protein-calorie malnutrition ([place]) 06/07/2020   History of COVID-19 05/31/2020   Post covid-19 condition, unspecified 05/30/2020   Debility 05/15/2020   Sacral wound    Palliative care encounter    Septic shock ([place])    Cellulitis of buttock    Fever    Status post tracheostomy ([place])    Aspiration into airway    Pressure injury of skin 03/07/2020   ARDS (adult respiratory distress syndrome) (HCC)    Acute respiratory failure with hypoxemia (HCC)    Encephalopathy acute    HCAP (healthcare-associated pneumonia)    Dyspnea, unspecified 02/13/2020   Cough 02/09/2020   Abnormal myocardial perfusion study 07/26/2018   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[place]) 07/15/2018   Normal coronary arteries 04/07/2018   Hypercalcemia 04/21/2016   Encounter for antibody response examination 03/26/2016   Weakness 03/17/2016   Hypotension 03/17/2016   Hyperlipidemia 03/17/2016   Peripheral neuropathy 03/17/2016   Precordial chest pain 03/17/2016   End stage renal disease on dialysis ([place]) 03/16/2016   Diabetes mellitus type 2 in obese ([place]) 03/16/2016   Essential hypertension 03/16/2016   Hypothyroidism 03/16/2016   Glaucoma 03/16/2016   GERD (gastroesophageal reflux disease) 03/16/2016   OSA (obstructive sleep apnea) 03/16/2016   Fluid overload, unspecified 10/23/2015   Diabetic polyneuropathy associated with type 2 diabetes mellitus ([place]) 09/28/2015   Long-term insulin use ([place]) 09/28/2015   Morbid (severe) obesity due to excess calories ([place]) 09/28/2015   Encounter for removal of sutures 02/28/2015   Iron deficiency anemia, unspecified 01/27/2015   Coagulation defect, unspecified ([place]) 01/16/2015   Epilepsy, unspecified, not intractable, without status epilepticus ([place]) 01/16/2015   Family history of disorders of kidney and ureter 01/16/2015   Gout, unspecified 01/16/2015   Other disorders of phosphorus metabolism 01/16/2015   Pain, unspecified 01/16/2015   Pruritus, unspecified 01/16/2015   Secondary hyperparathyroidism of renal origin ([place]) 01/16/2015   Unspecified background retinopathy 01/16/2015   Unspecified osteoarthritis, unspecified site 01/16/2015    Current Outpatient Medications on File Prior to Visit  Medication Sig Dispense Refill   acetaminophen (TYLENOL) 325 MG tablet Take 1-2 tablets (325-650 mg total) by mouth every 4 (four) hours as needed for mild pain.  albuterol (VENTOLIN HFA) 108 (90 Base) MCG/ACT inhaler Inhale 1-2 puffs into the lungs every 6 (six) hours as needed for wheezing or shortness of breath.     ALPRAZolam (XANAX) 0.25 MG tablet Take 0.25 mg by mouth every 6 (six) hours as needed for  anxiety.     aspirin EC 81 MG tablet Take 81 mg by mouth in the morning. Swallow whole.     AURYXIA 1 GM 210 MG(Fe) tablet Take 630 mg by mouth 3 (three) times daily with meals.     B Complex-C (B-COMPLEX WITH VITAMIN C) tablet Take 1 tablet by mouth daily. 30 tablet 0   Brinzolamide-Brimonidine (SIMBRINZA) 1-0.2 % SUSP Place 1 drop into both eyes in the morning, at noon, and at bedtime.     cinacalcet (SENSIPAR) 30 MG tablet Take 30 mg by mouth daily.     Dulaglutide (TRULICITY) 3.14 HF/0.2OV SOPN Inject 0.75 mg into the skin every Sunday.     fluticasone furoate-vilanterol (BREO ELLIPTA) 100-25 MCG/INH AEPB Inhale 1 puff into the lungs in the morning.     gabapentin (NEURONTIN) 100 MG capsule Take 100 mg by mouth 2 (two) times daily.     insulin aspart (NOVOLOG FLEXPEN) 100 UNIT/ML FlexPen Inject 4 Units into the skin 3 (three) times daily with meals.     insulin glargine (LANTUS) 100 UNIT/ML injection Inject 8 Units into the skin at bedtime.     levothyroxine (SYNTHROID) 125 MCG tablet Take 1 tablet (125 mcg total) by mouth daily before breakfast. 30 tablet 0   lip balm (CARMEX) ointment Apply topically as needed for lip care. 7 g 0   Methoxy PEG-Epoetin Beta (MIRCERA IJ) Dialysis Monday,Wednesday and friday     midodrine (PROAMATINE) 10 MG tablet Take 10 mg by mouth daily as needed (low blood pressure).     montelukast (SINGULAIR) 10 MG tablet Take 10 mg by mouth in the morning.     Netarsudil Dimesylate (RHOPRESSA) 0.02 % SOLN Place 1 drop into both eyes at bedtime.     Oxycodone HCl 10 MG TABS Take 10 mg by mouth 4 (four) times daily as needed (pain).     OXYGEN Inhale 5 L into the lungs continuous.     pantoprazole (PROTONIX) 40 MG tablet Take 1 tablet (40 mg total) by mouth daily. 30 tablet 0   QUEtiapine (SEROQUEL) 50 MG tablet Take 1 tablet (50 mg total) by mouth at bedtime. 30 tablet 0   No current facility-administered medications on file prior to visit.    Allergies  Allergen  Reactions   Hydrocodone Itching and Nausea And Vomiting    Review of Systems No fevers, chills, nausea, muscle aches, no difficulty breathing, no calf pain, no chest pain or shortness of breath.   Physical Exam  GENERAL APPEARANCE: Alert, conversant. Appropriately groomed. No acute distress.   VASCULAR: Pedal pulses palpable 1/4 DP and PT bilateral.  Capillary refill time is immediate to all digits,  Proximal to distal cooling it warm to warm.  Digital perfusion adequate.   NEUROLOGIC: sensation is intact to 5.07 monofilament at 3/5 sites bilateral.  Light touch is intact bilateral, vibratory sensation absent Left greater than right   MUSCULOSKELETAL: acceptable muscle strength, tone and stability bilateral.  No gross boney pedal deformities noted.  No pain, crepitus or limitation noted with foot and ankle range of motion bilateral.   DERMATOLOGIC: skin is warm, supple, and dry.  Color, texture, and turgor of skin within normal limits.  No open wounds  are noted.  No preulcerative lesions are seen.  Digital nails are elongated x 8.  They are friable, fragile and discolored. Bilateral fifth digital nails are thickened     Assessment     ICD-10-CM   1. Diabetic polyneuropathy associated with type 2 diabetes mellitus (HCC)  E11.42     2. Pain due to onychomycosis of nail  B35.1    M79.609         Plan  Discussed diabetes and neuropathy with the patient.  At todays visit I trimmed her nails for her.  She will be seen back in 3 months for further care.  If any pedial complaints arise prior to this visit she will call.

## 2022-02-27 ENCOUNTER — Ambulatory Visit: Payer: Medicaid Other | Admitting: Podiatry

## 2022-02-27 ENCOUNTER — Encounter: Payer: Self-pay | Admitting: Podiatry

## 2022-02-27 DIAGNOSIS — E1142 Type 2 diabetes mellitus with diabetic polyneuropathy: Secondary | ICD-10-CM | POA: Diagnosis not present

## 2022-02-27 DIAGNOSIS — B351 Tinea unguium: Secondary | ICD-10-CM | POA: Diagnosis not present

## 2022-02-27 DIAGNOSIS — L853 Xerosis cutis: Secondary | ICD-10-CM | POA: Diagnosis not present

## 2022-02-27 DIAGNOSIS — M79676 Pain in unspecified toe(s): Secondary | ICD-10-CM | POA: Diagnosis not present

## 2022-02-27 DIAGNOSIS — M79609 Pain in unspecified limb: Secondary | ICD-10-CM | POA: Diagnosis not present

## 2022-02-27 DIAGNOSIS — E119 Type 2 diabetes mellitus without complications: Secondary | ICD-10-CM | POA: Diagnosis not present

## 2022-02-27 MED ORDER — AMMONIUM LACTATE 12 % EX LOTN: TOPICAL_LOTION | CUTANEOUS | 5 refills | Status: DC

## 2022-03-01 NOTE — Progress Notes (Signed)
ANNUAL DIABETIC FOOT EXAM  Subjective: Wanda Ramirez presents today for annual diabetic foot examination.  Chief Complaint  Patient presents with   Nail Problem    Diabetic foot care    Patient confirms h/o diabetes.  Patient relates 8 year h/o diabetes.  Patient denies any h/o foot wounds.  Patient has been diagnosed with neuropathy and it is managed with gabapentin.  Last known  HgA1c was 6.0%. Patient did not check blood glucose this morning.  Patient does not monitor blood glucose daily.  Risk factors: diabetes, diabetic neuropathy, h/o MI, HTN, ESRD on hemodialysis.  Martin Majestic, FNP is patient's PCP. Last visit was September, 2023.  Past Medical History:  Diagnosis Date   Anemia    Anxiety    Arthritis    Asthma    Chronic back pain    Diabetes mellitus ([place])    Type II   Dyspnea    with exertion - 4L oxygen via [place]   ESRD (end stage renal disease) on dialysis ([place])    [facility] since 2016   Essential hypertension    GERD (gastroesophageal reflux disease)    Glaucoma    History of blood transfusion    Hyperlipidemia    Hypothyroidism    Morbid obesity ([place])    Myocardial infarction ([place])    mild per patient   Peripheral neuropathy    left foot   Requires continuous at home supplemental oxygen    4L via [place]   Sleep apnea    wears CPAP, does not know setting   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 ([place]) 06/11/2021   Anxiety 04/15/2021   Asthma 04/15/2021   Chronic back pain 04/15/2021   Diabetes mellitus ([place]) 04/15/2021   History of blood transfusion 04/15/2021   Requires continuous at home supplemental oxygen 04/15/2021   Pneumonia 03/05/2021   Syncope 03/04/2021   CAP (community acquired pneumonia) 03/04/2021   Chills (without fever) 01/07/2021   Allergy, unspecified, initial encounter 11/27/2020   Anaphylactic shock, unspecified, initial  encounter 95/62/1308   Complication associated with dialysis catheter 10/12/2020   Anemia 09/20/2020   CKD (chronic kidney disease) stage 5, GFR less than 15 ml/min ([place]) 09/20/2020   ESRD on hemodialysis ([place]) 09/20/2020   Renal disorder 09/20/2020   Mild protein-calorie malnutrition ([place]) 06/07/2020   History of COVID-19 05/31/2020   Post covid-19 condition, unspecified 05/30/2020   Debility 05/15/2020   Sacral wound    Palliative care encounter    Septic shock ([place])    Cellulitis of buttock    Fever    Status post tracheostomy ([place])    Aspiration into airway    Pressure injury of skin 03/07/2020   ARDS (adult respiratory distress syndrome) (HCC)    Acute respiratory failure with hypoxemia (HCC)    Encephalopathy acute    HCAP (healthcare-associated pneumonia)    Dyspnea, unspecified 02/13/2020   Cough 02/09/2020   Abnormal myocardial perfusion study 07/26/2018   Silent myocardial infarction ([place]) 07/15/2018   Normal coronary arteries 04/07/2018   Hypercalcemia 04/21/2016   Encounter for antibody response examination 03/26/2016   Weakness 03/17/2016   Hypotension 03/17/2016   Hyperlipidemia 03/17/2016   Peripheral neuropathy 03/17/2016   Precordial chest pain 03/17/2016   End stage renal disease on dialysis ([place]) 03/16/2016   Diabetes mellitus type 2 in obese ([place]) 03/16/2016   Essential hypertension 03/16/2016   Hypothyroidism 03/16/2016  Glaucoma 03/16/2016   GERD (gastroesophageal reflux disease) 03/16/2016   OSA (obstructive sleep apnea) 03/16/2016   Fluid overload, unspecified 10/23/2015   Diabetic polyneuropathy associated with type 2 diabetes mellitus ([place]) 09/28/2015   Long-term insulin use ([place]) 09/28/2015   Morbid (severe) obesity due to excess calories ([place]) 09/28/2015   Encounter for removal of sutures 02/28/2015   Iron deficiency anemia, unspecified 01/27/2015   Coagulation defect, unspecified ([place]) 01/16/2015   Epilepsy, unspecified, not intractable,  without status epilepticus ([place]) 01/16/2015   Family history of disorders of kidney and ureter 01/16/2015   Gout, unspecified 01/16/2015   Other disorders of phosphorus metabolism 01/16/2015   Pain, unspecified 01/16/2015   Pruritus, unspecified 01/16/2015   Secondary hyperparathyroidism of renal origin ([place]) 01/16/2015   Unspecified background retinopathy 01/16/2015   Unspecified osteoarthritis, unspecified site 01/16/2015   Past Surgical History:  Procedure Laterality Date   AV FISTULA PLACEMENT Left 04/2014   AV FISTULA PLACEMENT Right 08/15/2020   Procedure: RIGHT BRACHIOCEPHALIC ARTERIOVENOUS (AV) FISTULA CREATION;  Surgeon: Waynetta Sandy, MD;  Location: [facility];  Service: Vascular;  Laterality: Right;   AV FISTULA PLACEMENT Right 01/23/2021   Procedure: INSERTION OF RIGHT UPPER ARM ARTERIOVENOUS (AV) GORE-TEX GRAFT, 4-7 mm x 45 cm STRETCH VASCULAR GRAFT;  Surgeon: Serafina Mitchell, MD;  Location: [facility];  Service: Cardiovascular;  Laterality: Right;   CARDIAC CATHETERIZATION N/A 03/17/2016   Procedure: Left Heart Cath and Coronary Angiography;  Surgeon: [name], MD;  Location: [facility];  Service: Cardiovascular;  Laterality: N/A;   INCISION AND DRAINAGE PERIRECTAL ABSCESS N/A 04/03/2020   Procedure: EXCISIONAL DEBRIDEMENT OF SACRAL AND GLUTEAL WOUNDS;  Surgeon: [name], MD;  Location: [facility];  Service: General;  Laterality: N/A;   IR FLUORO GUIDE CV LINE LEFT  02/22/2020   IR FLUORO GUIDE CV LINE LEFT  02/27/2020   IR FLUORO GUIDE CV LINE LEFT  05/30/2020   IR FLUORO GUIDE CV LINE LEFT  10/10/2020   IR FLUORO GUIDE CV LINE LEFT  10/12/2020   IR FLUORO GUIDE CV LINE LEFT  01/17/2021   IR US GUIDE VASC ACCESS LEFT  02/22/2020   KNEE SURGERY     LIGATION OF COMPETING BRANCHES OF ARTERIOVENOUS FISTULA Right 11/14/2020   Procedure: LIGATION OF COMPETING BRANCHES AND SUPERFICIALIZATION OF RIGHT ARTERIOVENOUS FISTULA;  Surgeon: Waynetta Sandy,  MD;  Location: [facility];  Service: Vascular;  Laterality: Right;   TUBAL LIGATION     UPPER EXTREMITY VENOGRAPHY Bilateral 01/14/2021   Procedure: UPPER EXTREMITY VENOGRAPHY;  Surgeon: Waynetta Sandy, MD;  Location: [facility];  Service: Cardiovascular;  Laterality: Bilateral;   Current Outpatient Medications on File Prior to Visit  Medication Sig Dispense Refill   ACCU-CHEK GUIDE test strip      Accu-Chek Softclix Lancets lancets SMARTSIG:Topical     acetaminophen (TYLENOL) 325 MG tablet Take 1-2 tablets (325-650 mg total) by mouth every 4 (four) hours as needed for mild pain.     albuterol (VENTOLIN HFA) 108 (90 Base) MCG/ACT inhaler Inhale 1-2 puffs into the lungs every 6 (six) hours as needed for wheezing or shortness of breath.     ALPRAZolam (XANAX) 0.25 MG tablet Take 0.25 mg by mouth every 6 (six) hours as needed for anxiety.     aspirin EC 81 MG tablet Take 81 mg by mouth in the morning. Swallow whole.     AURYXIA 1 GM 210 MG(Fe) tablet Take 630 mg by mouth 3 (three)  times daily with meals.     B Complex-C (B-COMPLEX WITH VITAMIN C) tablet Take 1 tablet by mouth daily. 30 tablet 0   Brinzolamide-Brimonidine (SIMBRINZA) 1-0.2 % SUSP Place 1 drop into both eyes in the morning, at noon, and at bedtime.     cinacalcet (SENSIPAR) 30 MG tablet Take 30 mg by mouth daily.     Doxercalciferol (HECTOROL IV) Doxercalciferol (Hectorol)     Dulaglutide (TRULICITY) 3.29 JJ/8.8CZ SOPN Inject 0.75 mg into the skin every Sunday.     fluticasone furoate-vilanterol (BREO ELLIPTA) 100-25 MCG/INH AEPB Inhale 1 puff into the lungs in the morning.     FOSRENOL 1000 MG chewable tablet Chew by mouth.     gabapentin (NEURONTIN) 100 MG capsule Take 100 mg by mouth 2 (two) times daily.     hydrOXYzine (ATARAX) 25 MG tablet Take by mouth.     insulin aspart (NOVOLOG FLEXPEN) 100 UNIT/ML FlexPen Inject 4 Units into the skin 3 (three) times daily with meals.     insulin glargine (LANTUS) 100 UNIT/ML  injection Inject 8 Units into the skin at bedtime.     Insulin Pen Needle (PEN NEEDLES 31GX5/16") 31G X 8 MM MISC USE AS NEEDED FOR 3 INJECTIONS DAILY     latanoprost (XALATAN) 0.005 % ophthalmic solution SMARTSIG:In Eye(s)     levothyroxine (SYNTHROID) 125 MCG tablet Take 1 tablet (125 mcg total) by mouth daily before breakfast. 30 tablet 0   lip balm (CARMEX) ointment Apply topically as needed for lip care. 7 g 0   midodrine (PROAMATINE) 10 MG tablet Take 10 mg by mouth daily as needed (low blood pressure).     montelukast (SINGULAIR) 10 MG tablet Take 10 mg by mouth in the morning.     MOUNJARO 2.5 MG/0.5ML Pen Inject into the skin.     Netarsudil Dimesylate (RHOPRESSA) 0.02 % SOLN Place 1 drop into both eyes at bedtime.     Oxycodone HCl 10 MG TABS Take 10 mg by mouth 4 (four) times daily as needed (pain).     OXYGEN Inhale 5 L into the lungs continuous.     pantoprazole (PROTONIX) 40 MG tablet Take 1 tablet (40 mg total) by mouth daily. 30 tablet 0   QUEtiapine (SEROQUEL) 50 MG tablet Take 1 tablet (50 mg total) by mouth at bedtime. 30 tablet 0   sevelamer carbonate (RENVELA) 800 MG tablet Take by mouth.     VYZULTA 0.024 % SOLN Apply 1 drop to eye at bedtime.     No current facility-administered medications on file prior to visit.    Allergies  Allergen Reactions   Hydrocodone Itching and Nausea And Vomiting   Social History   Occupational History   Occupation: disabled  Tobacco Use   Smoking status: Former    Packs/day: 0.50    Years: 25.00    Total pack years: 12.50    Types: Cigarettes   Smokeless tobacco: Never   Tobacco comments:    Smoked x 2 yrs  Vaping Use   Vaping Use: Never used  Substance and Sexual Activity   Alcohol use: No    Comment: years ago   Drug use: No   Sexual activity: Not on file   Family History  Problem Relation Age of Onset   Diabetes Mother    Hyperlipidemia Mother    Kidney disease Father    Immunization History  Administered  Date(s) Administered   Hepatitis B, adult 03/01/2015, 03/29/2015, 04/27/2015, 08/23/2015   Influenza Split 02/09/2018  Influenza,inj,Quad PF,6+ Mos 02/09/2018, 02/24/2019, 07/17/2020   Moderna Sars-Covid-2 Vaccination 07/14/2019, 08/13/2019   Pneumococcal Conjugate-13 01/25/2015   Pneumococcal Polysaccharide-23 04/08/2016     Review of Systems: Negative except as noted in the HPI.   Objective: There were no vitals filed for this visit.  Wanda Ramirez is a pleasant 51 y.o. female in NAD. AAO X 3.  Vascular Examination: CFT <3 seconds b/l. DP/PT pulses faintly palpable b/l. Skin temperature gradient warm to warm b/l. No pain with calf compression. No ischemia or gangrene. No cyanosis or clubbing noted b/l. No edema noted b/l LE.   Neurological Examination: Sensation grossly intact b/l with 10 gram monofilament. Vibratory sensation intact b/l. Protective sensation decreased with 10 gram monofilament b/l. Vibratory sensation diminished b/l.  Dermatological Examination: Pedal skin warm and supple b/l. Toenails b/l 5th digits thick, discolored, elongated with subungual debris and pain on dorsal palpation.  Nondystrophic toenails b/l lower extremities and 1-4 bilaterally. Pedal skin noted to be dry b/l lower extremities.  Musculoskeletal Examination: Normal muscle strength 5/5 to all lower extremity muscle groups bilaterally. No pain, crepitus or joint limitation noted with ROM b/l LE. No gross bony pedal deformities b/l. Patient ambulates independently without assistive aids.  Radiographs: None  Footwear Assessment: Does the patient wear appropriate shoes? Yes. Does the patient need inserts/orthotics? No.  ADA Risk Categorization: High Risk  Patient has one or more of the following: Loss of protective sensation Absent pedal pulses Severe Foot deformity History of foot ulcer  Assessment: 1. Pain due to onychomycosis of nail   2. Xerosis cutis   3. Diabetic polyneuropathy  associated with type 2 diabetes mellitus ([place])   4. Encounter for diabetic foot exam [facility])     Plan: -Patient was evaluated and treated. All patient's and/or POA's questions/concerns answered on today's visit. -Diabetic foot examination performed today. -Stressed the importance of good glycemic control and the detriment of not  controlling glucose levels in relation to the foot. -Patient to continue soft, supportive shoe gear daily. -Toenails bilateral 5th toes debrided in length and girth without iatrogenic bleeding with sterile nail nipper and dremel.  -Nondystrophic toenails trimmed 1-4 bilaterally. -For xerosis, Rx sent for Ammonium Lactate Lotion 12%. Apply to feet twice daily avoiding application between toes. -Patient/POA to call should there be question/concern in the interim. Return in about 3 months (around 05/29/2022).  Marzetta Board, DPM

## 2022-03-30 DIAGNOSIS — R079 Chest pain, unspecified: Secondary | ICD-10-CM

## 2022-06-05 ENCOUNTER — Ambulatory Visit: Payer: Medicaid Other | Admitting: Podiatry

## 2022-06-19 ENCOUNTER — Ambulatory Visit: Payer: Medicaid Other | Admitting: Podiatry

## 2022-06-19 ENCOUNTER — Encounter: Payer: Self-pay | Admitting: Podiatry

## 2022-06-19 VITALS — BP 83/53 | HR 87

## 2022-06-19 DIAGNOSIS — E1142 Type 2 diabetes mellitus with diabetic polyneuropathy: Secondary | ICD-10-CM

## 2022-06-19 DIAGNOSIS — L84 Corns and callosities: Secondary | ICD-10-CM

## 2022-06-19 DIAGNOSIS — B351 Tinea unguium: Secondary | ICD-10-CM | POA: Diagnosis not present

## 2022-06-19 DIAGNOSIS — M79676 Pain in unspecified toe(s): Secondary | ICD-10-CM | POA: Diagnosis not present

## 2022-06-19 DIAGNOSIS — M79609 Pain in unspecified limb: Secondary | ICD-10-CM

## 2022-06-19 NOTE — Progress Notes (Signed)
  Subjective:  Patient ID: Wanda Ramirez, female    DOB: [birth year],  MRN: [id number]  Wanda Ramirez presents to clinic today for at risk foot care with history of diabetic neuropathy and painful thick toenails that are difficult to trim. Pain interferes with ambulation. Aggravating factors include wearing enclosed shoe gear. Pain is relieved with periodic professional debridement.  Chief Complaint  Patient presents with   Diabetes    Diabetic foot care, A1c- 6.3 BG- 123, Nail trim, last seen PCP month ago   New problem(s): None.   PCP is Martin Majestic, FNP.  Allergies  Allergen Reactions   Hydrocodone Itching and Nausea And Vomiting    Review of Systems: Negative except as noted in the HPI.  Objective:  Vitals:   06/19/22 1322  BP: (!) 83/53  Pulse: 87   Wanda Ramirez is a pleasant 52 y.o. female obese in NAD. AAO x 3.  Vascular Examination: CFT <3 seconds b/l. DP/PT pulses faintly palpable b/l. Skin temperature gradient warm to warm b/l. No pain with calf compression. No ischemia or gangrene. No cyanosis or clubbing noted b/l. No edema noted b/l LE.   Neurological Examination: Sensation grossly intact b/l with 10 gram monofilament. Vibratory sensation intact b/l. Protective sensation decreased with 10 gram monofilament b/l. Vibratory sensation diminished b/l.  Dermatological Examination: Pedal skin warm and supple b/l. Toenails b/l 5th digits thick, discolored, elongated with subungual debris and pain on dorsal palpation.  Nondystrophic toenails b/l lower extremities and 1-4 bilaterally. Pedal skin noted to be dry b/l lower extremities.  Hyperkeratotic lesion(s) R 5th toe.  No erythema, no edema, no drainage, no fluctuance.  Musculoskeletal Examination: Normal muscle strength 5/5 to all lower extremity muscle groups bilaterally. No pain, crepitus or joint limitation noted with ROM b/l LE. No gross bony pedal deformities b/l. Patient ambulates independently  without assistive aids.  Radiographs: None Assessment/Plan: 1. Pain due to onychomycosis of nail   2. Corns   3. Diabetic polyneuropathy associated with type 2 diabetes mellitus ([place])     No orders of the defined types were placed in this encounter.   -Consent given for treatment as described below: -Examined patient. -Continue supportive shoe gear daily. -Mycotic toenails bilateral 5th toes were debrided in length and girth with sterile nail nippers and dremel without iatrogenic bleeding. -Nondystrophic toenails trimmed 1-4 bilaterally. -As a courtesy, corn(s) R 5th toe pared utilizing sterile scalpel blade without complication or incident. Total number pared=1. -Patient/POA to call should there be question/concern in the interim.   Return in about 3 months (around 09/18/2022).  Marzetta Board, DPM

## 2022-09-02 ENCOUNTER — Ambulatory Visit: Payer: Medicaid Other | Admitting: Podiatry

## 2022-09-09 ENCOUNTER — Ambulatory Visit: Payer: Medicaid Other | Admitting: Podiatry

## 2022-09-09 DIAGNOSIS — M79609 Pain in unspecified limb: Secondary | ICD-10-CM

## 2022-09-09 DIAGNOSIS — E1142 Type 2 diabetes mellitus with diabetic polyneuropathy: Secondary | ICD-10-CM

## 2022-09-09 DIAGNOSIS — B351 Tinea unguium: Secondary | ICD-10-CM

## 2022-09-09 DIAGNOSIS — L84 Corns and callosities: Secondary | ICD-10-CM

## 2022-09-09 DIAGNOSIS — M79676 Pain in unspecified toe(s): Secondary | ICD-10-CM

## 2022-09-09 NOTE — Progress Notes (Signed)
  Subjective:  Patient ID: Wanda Ramirez, female    DOB: June 06, 1970,  MRN: [id number]  Chief Complaint  Patient presents with   diabetic foot care     52 y.o. female presents with the above complaint. History confirmed with patient. Patient presenting with pain related to dystrophic thickened elongated nails. Patient is unable to trim own nails related to nail dystrophy and/or mobility issues. Patient does have a history of T2DM. Patient does have numbness on bottom of foot.   Objective:  Physical Exam: warm, good capillary refill nail exam onychomycosis of the toenails, onycholysis, and dystrophic nails DP pulses palpable, PT pulses palpable, and protective sensation absent Left Foot:  Pain with palpation of nails due to elongation and dystrophic growth.  Right Foot: Pain with palpation of nails due to elongation and dystrophic growth.   Assessment:   1. Pain due to onychomycosis of nail   2. Corns   3. Diabetic polyneuropathy associated with type 2 diabetes mellitus      Plan:  Patient was evaluated and treated and all questions answered.  #Onychomycosis with pain  -Nails palliatively debrided as below. -Educated on self-care  Procedure: Nail Debridement Rationale: Pain Type of Debridement: manual, sharp debridement. Instrumentation: Nail nipper, rotary burr. Number of Nails: 10  Return in about 3 months (around 12/09/2022) for [facility].         Corinna Gab, DPM [facility] / [facility]

## 2022-09-11 ENCOUNTER — Ambulatory Visit: Payer: Medicaid Other | Admitting: Podiatry

## 2022-09-11 ENCOUNTER — Other Ambulatory Visit (HOSPITAL_BASED_OUTPATIENT_CLINIC_OR_DEPARTMENT_OTHER): Payer: Self-pay

## 2022-09-11 MED ORDER — MOUNJARO 7.5 MG/0.5ML ~~LOC~~ SOAJ
SUBCUTANEOUS | 2 refills | Status: DC
  Filled 2022-09-11: qty 2, 28d supply, fill #0

## 2022-12-09 ENCOUNTER — Ambulatory Visit: Payer: Medicaid Other | Admitting: Podiatry

## 2022-12-09 DIAGNOSIS — E1142 Type 2 diabetes mellitus with diabetic polyneuropathy: Secondary | ICD-10-CM

## 2022-12-09 DIAGNOSIS — B351 Tinea unguium: Secondary | ICD-10-CM | POA: Diagnosis not present

## 2022-12-09 DIAGNOSIS — M79676 Pain in unspecified toe(s): Secondary | ICD-10-CM

## 2022-12-09 NOTE — Progress Notes (Signed)
  Subjective:  Patient ID: Wanda Ramirez, female    DOB: [birth year],  MRN: [id number]  Chief Complaint  Patient presents with   Nail Problem    Diabetic Foot Care-nail trim. Patient administers insulin for diabetes control.     52 y.o. female presents with the above complaint. History confirmed with patient. Patient presenting with pain related to dystrophic thickened elongated nails. Patient is unable to trim own nails related to nail dystrophy and/or mobility issues. Patient does have a history of T2DM. Patient does have numbness on bottom of foot.   Objective:  Physical Exam: warm, good capillary refill nail exam onychomycosis of the toenails, onycholysis, and dystrophic nails DP pulses palpable, PT pulses palpable, and protective sensation absent Left Foot:  Pain with palpation of nails due to elongation and dystrophic growth.  Right Foot: Pain with palpation of nails due to elongation and dystrophic growth.   Assessment:   1. Pain due to onychomycosis of nail   2. Diabetic polyneuropathy associated with type 2 diabetes mellitus (HCC)       Plan:  Patient was evaluated and treated and all questions answered.  #Onychomycosis with pain  -Nails palliatively debrided as below. -Educated on self-care  Procedure: Nail Debridement Rationale: Pain Type of Debridement: manual, sharp debridement. Instrumentation: Nail nipper, rotary burr. Number of Nails: 10  No follow-ups on file.         Corinna Gab, DPM [facility] / [facility]

## 2023-02-09 DIAGNOSIS — Z940 Kidney transplant status: Secondary | ICD-10-CM | POA: Insufficient documentation

## 2023-02-16 DIAGNOSIS — Z87891 Personal history of nicotine dependence: Secondary | ICD-10-CM | POA: Insufficient documentation

## 2023-02-16 DIAGNOSIS — Z7985 Long-term (current) use of injectable non-insulin antidiabetic drugs: Secondary | ICD-10-CM | POA: Insufficient documentation

## 2023-02-16 DIAGNOSIS — E039 Hypothyroidism, unspecified: Secondary | ICD-10-CM | POA: Insufficient documentation

## 2023-02-16 DIAGNOSIS — J4489 Other specified chronic obstructive pulmonary disease: Secondary | ICD-10-CM | POA: Insufficient documentation

## 2023-02-16 DIAGNOSIS — E1142 Type 2 diabetes mellitus with diabetic polyneuropathy: Secondary | ICD-10-CM | POA: Insufficient documentation

## 2023-02-16 DIAGNOSIS — E11319 Type 2 diabetes mellitus with unspecified diabetic retinopathy without macular edema: Secondary | ICD-10-CM | POA: Insufficient documentation

## 2023-02-16 DIAGNOSIS — Z7982 Long term (current) use of aspirin: Secondary | ICD-10-CM | POA: Insufficient documentation

## 2023-02-16 DIAGNOSIS — I2510 Atherosclerotic heart disease of native coronary artery without angina pectoris: Secondary | ICD-10-CM | POA: Insufficient documentation

## 2023-02-16 DIAGNOSIS — E785 Hyperlipidemia, unspecified: Secondary | ICD-10-CM | POA: Insufficient documentation

## 2023-02-16 DIAGNOSIS — M1030 Gout due to renal impairment, unspecified site: Secondary | ICD-10-CM | POA: Insufficient documentation

## 2023-02-25 DIAGNOSIS — D631 Anemia in chronic kidney disease: Secondary | ICD-10-CM | POA: Insufficient documentation

## 2023-03-17 ENCOUNTER — Ambulatory Visit (INDEPENDENT_AMBULATORY_CARE_PROVIDER_SITE_OTHER): Payer: Medicaid Other | Admitting: Podiatry

## 2023-03-17 DIAGNOSIS — M79676 Pain in unspecified toe(s): Secondary | ICD-10-CM

## 2023-03-17 DIAGNOSIS — B351 Tinea unguium: Secondary | ICD-10-CM

## 2023-03-17 DIAGNOSIS — E1142 Type 2 diabetes mellitus with diabetic polyneuropathy: Secondary | ICD-10-CM

## 2023-03-17 NOTE — Progress Notes (Signed)
Subjective:  Patient ID: Wanda Ramirez, female    DOB: 05/05/1971,  MRN: [id number]  Chief Complaint  Patient presents with   Nail Problem    Rm 2-Here for diabetic nail care- she has neuropathy in both feet.  Saw her primary care physician last month.  She sees Misty Stanley.   Last HgA1c was 5.3.      52 y.o. female presents with the above complaint. History confirmed with patient. Patient presenting with pain related to dystrophic thickened elongated nails. Patient is unable to trim own nails related to nail dystrophy and/or mobility issues. Patient does have a history of T2DM. Patient does have numbness on bottom of foot.   Objective:  Physical Exam: warm, good capillary refill nail exam onychomycosis of the toenails, onycholysis, and dystrophic nails DP pulses palpable, PT pulses palpable, and protective sensation absent Left Foot:  Pain with palpation of nails due to elongation and dystrophic growth.  Right Foot: Pain with palpation of nails due to elongation and dystrophic growth.   Assessment:   1. Pain due to onychomycosis of nail   2. Diabetic polyneuropathy associated with type 2 diabetes mellitus (HCC)     Plan:  Patient was evaluated and treated and all questions answered.  #Onychomycosis with pain  -Nails palliatively debrided as below. -Educated on self-care  Procedure: Nail Debridement Rationale: Pain Type of Debridement: manual, sharp debridement. Instrumentation: Nail nipper, rotary burr. Number of Nails: 10  Return in about 3 months (around 06/17/2023) for [facility].         Wanda Ramirez, DPM [facility] / [facility]

## 2023-06-17 ENCOUNTER — Ambulatory Visit (INDEPENDENT_AMBULATORY_CARE_PROVIDER_SITE_OTHER): Payer: Medicaid Other | Admitting: Podiatry

## 2023-06-17 DIAGNOSIS — B351 Tinea unguium: Secondary | ICD-10-CM

## 2023-06-17 DIAGNOSIS — M79674 Pain in right toe(s): Secondary | ICD-10-CM

## 2023-06-17 DIAGNOSIS — M79675 Pain in left toe(s): Secondary | ICD-10-CM

## 2023-06-17 DIAGNOSIS — L853 Xerosis cutis: Secondary | ICD-10-CM

## 2023-06-17 MED ORDER — AMMONIUM LACTATE 12 % EX LOTN: TOPICAL_LOTION | CUTANEOUS | 5 refills | Status: AC

## 2023-06-17 NOTE — Progress Notes (Signed)
Subjective:  Patient ID: Wanda Ramirez, female    DOB: November 28, 1970,  MRN: [id number]   Wanda Ramirez presents to clinic today for:  Chief Complaint  Patient presents with   [facility]    DFC, A1c 6.4 ASA 81 Attempting to reconcile meds, she will bring me a list.    Patient notes nails are thick, discolored, elongated and painful in shoegear when trying to ambulate.  Patient notes that she underwent a kidney transplant in September 2024.  She is now on CellCept and prednisone along with several other medications.  She did not bring a list with her so that we can update her chart today.  She will bring this at next visit.  The kidney transplant was performed at [facility].  States that she sees her PCP at least every 6 months and was just seen in December.  She is wondering if there is anything to treat her dry skin on her feet/heels  PCP is Grayland Jack, FNP.  Past Medical History:  Diagnosis Date   Anemia    Anxiety    Arthritis    Asthma    Chronic back pain    Diabetes mellitus (HCC)    Type II   Dyspnea    with exertion - 4L oxygen via [place]   ESRD (end stage renal disease) on dialysis (HCC)    M W F - [place] [facility] since 2016   Essential hypertension    GERD (gastroesophageal reflux disease)    Glaucoma    History of blood transfusion    Hyperlipidemia    Hypothyroidism    Morbid obesity (HCC)    Myocardial infarction (HCC)    mild per patient   Peripheral neuropathy    left foot   Requires continuous at home supplemental oxygen    4L via [place]   Sleep apnea    wears CPAP, does not know setting    Past Surgical History:  Procedure Laterality Date   AV FISTULA PLACEMENT Left 04/2014   AV FISTULA PLACEMENT Right 08/15/2020   Procedure: RIGHT BRACHIOCEPHALIC ARTERIOVENOUS (AV) FISTULA CREATION;  Surgeon: Maeola Harman, MD;  [facility];  Service: Vascular;  Laterality: Right;   AV FISTULA PLACEMENT Right 01/23/2021   Procedure:  INSERTION OF RIGHT UPPER ARM ARTERIOVENOUS (AV) GORE-TEX GRAFT, 4-7 mm x 45 cm STRETCH VASCULAR GRAFT;  Surgeon: Nada Libman, MD;  [facility];  Service: Cardiovascular;  Laterality: Right;   CARDIAC CATHETERIZATION N/A 03/17/2016   Procedure: Left Heart Cath and Coronary Angiography;  Surgeon: Kathleene Hazel, MD;  Location: [facility] INVASIVE CV LAB;  Service: Cardiovascular;  Laterality: N/A;   INCISION AND DRAINAGE PERIRECTAL ABSCESS N/A 04/03/2020   Procedure: EXCISIONAL DEBRIDEMENT OF SACRAL AND GLUTEAL WOUNDS;  Surgeon: Diamantina Monks, MD;  [facility];  Service: General;  Laterality: N/A;   IR FLUORO GUIDE CV LINE LEFT  02/22/2020   IR FLUORO GUIDE CV LINE LEFT  02/27/2020   IR FLUORO GUIDE CV LINE LEFT  05/30/2020   IR FLUORO GUIDE CV LINE LEFT  10/10/2020   IR FLUORO GUIDE CV LINE LEFT  10/12/2020   IR FLUORO GUIDE CV LINE LEFT  01/17/2021   IR US GUIDE VASC ACCESS LEFT  02/22/2020   KNEE SURGERY     LIGATION OF COMPETING BRANCHES OF ARTERIOVENOUS FISTULA Right 11/14/2020   Procedure: LIGATION OF COMPETING BRANCHES AND SUPERFICIALIZATION OF RIGHT ARTERIOVENOUS FISTULA;  Surgeon: Lemar Livings  Cristal Deer, MD;  Location: [facility] OR;  Service: Vascular;  Laterality: Right;   TUBAL LIGATION     UPPER EXTREMITY VENOGRAPHY Bilateral 01/14/2021   Procedure: UPPER EXTREMITY VENOGRAPHY;  Surgeon: Maeola Harman, MD;  Location: [facility] INVASIVE CV LAB;  Service: Cardiovascular;  Laterality: Bilateral;    Allergies  Allergen Reactions   Hydrocodone Itching and Nausea And Vomiting   Review of Systems: Negative except as noted in the HPI.  Objective:  Wanda Ramirez is a pleasant 53 y.o. female in NAD. AAO x 3.  Vascular Examination: Capillary refill time is 3-5 seconds to toes bilateral. Palpable pedal pulses b/l LE. Digital hair present b/l.  Skin temperature gradient WNL b/l. No varicosities b/l. No cyanosis noted b/l.   Dermatological Examination: Pedal skin with normal  turgor, texture and tone b/l. No open wounds. No interdigital macerations b/l. Toenails x10 are 3mm thick, discolored, dystrophic with subungual debris. There is pain with compression of the nail plates.  They are elongated x10.  The heels are dry and slightly flaky bilateral.  Assessment/Plan: 1. Pain due to onychomycosis of toenails of both feet   2. Xerosis cutis     Meds ordered this encounter  Medications   ammonium lactate (LAC-HYDRIN) 12 % lotion    Sig: Apply to feet bid. Do not apply between toes.    Dispense:  400 g    Refill:  5   The mycotic toenails were sharply debrided x10 with sterile nail nippers and a power debriding burr to decrease bulk/thickness and length.    Sent a prescription in for ammonium lactate 12% lotion to apply to the feet and legs daily for the dry skin.  Return in about 3 months (around 09/15/2023) for [facility].   Clerance Lav, DPM, FACFAS [facility]     [address] [place], [place] [address]                Office [phone]  Fax [phone]

## 2023-08-25 LAB — PROSPERA: CURRENT TEST RESULT: 0.09

## 2023-09-16 ENCOUNTER — Encounter: Payer: Medicaid Other | Admitting: Podiatry

## 2023-09-16 NOTE — Progress Notes (Signed)
Patient did not show for scheduled appointment today.

## 2023-09-23 ENCOUNTER — Ambulatory Visit: Admitting: Podiatry

## 2023-09-30 ENCOUNTER — Ambulatory Visit: Admitting: Podiatry

## 2023-09-30 DIAGNOSIS — B351 Tinea unguium: Secondary | ICD-10-CM

## 2023-09-30 DIAGNOSIS — M79674 Pain in right toe(s): Secondary | ICD-10-CM | POA: Diagnosis not present

## 2023-09-30 DIAGNOSIS — M79675 Pain in left toe(s): Secondary | ICD-10-CM | POA: Diagnosis not present

## 2023-09-30 NOTE — Progress Notes (Signed)
 Subjective:  Patient ID: Wanda Ramirez, female    DOB: 1971-01-09,  MRN: [id number]   Wanda Ramirez presents to clinic today for:  Chief Complaint  Patient presents with   [facility]    [facility] with out callous. Last A1c was 6.4, six months ago and takes ASA 81   Patient notes nails are thick, discolored, elongated and painful in shoegear when trying to ambulate.  She notes some swelling in the feet and points to the dorsal first interspace where she notices it.  Not having any pain here and denies injury.  PCP is Dara Ear, FNP.  Past Medical History:  Diagnosis Date   Anemia    Anxiety    Arthritis    Asthma    Chronic back pain    Diabetes mellitus (HCC)    Type II   Dyspnea    with exertion - 4L oxygen via [place]   ESRD (end stage renal disease) on dialysis (HCC)    M W F - [place] [facility] since 2016   Essential hypertension    GERD (gastroesophageal reflux disease)    Glaucoma    History of blood transfusion    Hyperlipidemia    Hypothyroidism    Morbid obesity (HCC)    Myocardial infarction (HCC)    mild per patient   Peripheral neuropathy    left foot   Requires continuous at home supplemental oxygen    4L via [place]   Sleep apnea    wears CPAP, does not know setting    Past Surgical History:  Procedure Laterality Date   AV FISTULA PLACEMENT Left 04/2014   AV FISTULA PLACEMENT Right 08/15/2020   Procedure: RIGHT BRACHIOCEPHALIC ARTERIOVENOUS (AV) FISTULA CREATION;  Surgeon: Adine Hoof, MD;  [facility];  Service: Vascular;  Laterality: Right;   AV FISTULA PLACEMENT Right 01/23/2021   Procedure: INSERTION OF RIGHT UPPER ARM ARTERIOVENOUS (AV) GORE-TEX GRAFT, 4-7 mm x 45 cm STRETCH VASCULAR GRAFT;  Surgeon: Margherita Shell, MD;  [facility];  Service: Cardiovascular;  Laterality: Right;   CARDIAC CATHETERIZATION N/A 03/17/2016   Procedure: Left Heart Cath and Coronary Angiography;  Surgeon: Odie Benne, MD;   Location: [facility] INVASIVE CV LAB;  Service: Cardiovascular;  Laterality: N/A;   INCISION AND DRAINAGE PERIRECTAL ABSCESS N/A 04/03/2020   Procedure: EXCISIONAL DEBRIDEMENT OF SACRAL AND GLUTEAL WOUNDS;  Surgeon: Anda Bamberg, MD;  [facility];  Service: General;  Laterality: N/A;   IR FLUORO GUIDE CV LINE LEFT  02/22/2020   IR FLUORO GUIDE CV LINE LEFT  02/27/2020   IR FLUORO GUIDE CV LINE LEFT  05/30/2020   IR FLUORO GUIDE CV LINE LEFT  10/10/2020   IR FLUORO GUIDE CV LINE LEFT  10/12/2020   IR FLUORO GUIDE CV LINE LEFT  01/17/2021   IR US  GUIDE VASC ACCESS LEFT  02/22/2020   KNEE SURGERY     LIGATION OF COMPETING BRANCHES OF ARTERIOVENOUS FISTULA Right 11/14/2020   Procedure: LIGATION OF COMPETING BRANCHES AND SUPERFICIALIZATION OF RIGHT ARTERIOVENOUS FISTULA;  Surgeon: Adine Hoof, MD;  Location: [facility] OR;  Service: Vascular;  Laterality: Right;   TUBAL LIGATION     UPPER EXTREMITY VENOGRAPHY Bilateral 01/14/2021   Procedure: UPPER EXTREMITY VENOGRAPHY;  Surgeon: Adine Hoof, MD;  Location: [facility] INVASIVE CV LAB;  Service: Cardiovascular;  Laterality: Bilateral;    Allergies  Allergen Reactions   Hydrocodone Itching and Nausea And Vomiting  Review of Systems: Negative except as noted in the HPI.  Objective:  Wanda Ramirez is a pleasant 53 y.o. female in NAD. AAO x 3.  Vascular Examination: Capillary refill time is 3-5 seconds to toes bilateral. Palpable pedal pulses b/l LE. Digital hair present b/l.  Skin temperature gradient WNL b/l. No varicosities b/l. No cyanosis noted b/l.  Minimal edema dorsal first interspace bilateral.  Dermatological Examination: Pedal skin with normal turgor, texture and tone b/l. No open wounds. No interdigital macerations b/l. Toenails x10 are 3mm thick, discolored, dystrophic with subungual debris. There is pain with compression of the nail plates.  They are elongated x10  Assessment/Plan: 1. Pain due to onychomycosis of  toenails of both feet    The mycotic toenails were sharply debrided x10 with sterile nail nippers and a power debriding burr to decrease bulk/thickness and length.    Return in about 3 months (around 12/30/2023) for [facility].   Joe Murders, DPM, FACFAS [facility]     [address] [place], [place] [address]                Office [phone]  Fax [phone]

## 2023-12-30 ENCOUNTER — Ambulatory Visit: Admitting: Podiatry

## 2024-01-12 LAB — PROSPERA: CURRENT TEST RESULT: <0.08

## 2024-01-15 ENCOUNTER — Ambulatory Visit: Admitting: Podiatry

## 2024-01-15 DIAGNOSIS — M79674 Pain in right toe(s): Secondary | ICD-10-CM | POA: Diagnosis not present

## 2024-01-15 DIAGNOSIS — M79675 Pain in left toe(s): Secondary | ICD-10-CM | POA: Diagnosis not present

## 2024-01-15 DIAGNOSIS — B351 Tinea unguium: Secondary | ICD-10-CM

## 2024-01-15 NOTE — Progress Notes (Signed)
 Subjective:  Patient ID: Wanda Ramirez, female    DOB: 04-13-1971,  MRN: [id number]   Wanda Ramirez presents to clinic today for:  Chief Complaint  Patient presents with   [facility]    [facility] with out callous, Last A1c in May 8.0 and takes ASA   Patient notes nails are thick, discolored, elongated and painful in shoegear when trying to ambulate.   PCP is Trudy Wanda MATSU, FNP.  Past Medical History:  Diagnosis Date   Anemia    Anxiety    Arthritis    Asthma    Chronic back pain    Diabetes mellitus (HCC)    Type II   Dyspnea    with exertion - 4L oxygen via [place]   ESRD (end stage renal disease) on dialysis (HCC)    M W F - [place] [facility] since 2016   Essential hypertension    GERD (gastroesophageal reflux disease)    Glaucoma    History of blood transfusion    Hyperlipidemia    Hypothyroidism    Morbid obesity (HCC)    Myocardial infarction (HCC)    mild per patient   Peripheral neuropathy    left foot   Requires continuous at home supplemental oxygen    4L via [place]   Sleep apnea    wears CPAP, does not know setting    Past Surgical History:  Procedure Laterality Date   AV FISTULA PLACEMENT Left 04/2014   AV FISTULA PLACEMENT Right 08/15/2020   Procedure: RIGHT BRACHIOCEPHALIC ARTERIOVENOUS (AV) FISTULA CREATION;  Surgeon: Sheree Penne Bruckner, MD;  [facility];  Service: Vascular;  Laterality: Right;   AV FISTULA PLACEMENT Right 01/23/2021   Procedure: INSERTION OF RIGHT UPPER ARM ARTERIOVENOUS (AV) GORE-TEX GRAFT, 4-7 mm x 45 cm STRETCH VASCULAR GRAFT;  Surgeon: Serene Gaile ORN, MD;  [facility];  Service: Cardiovascular;  Laterality: Right;   CARDIAC CATHETERIZATION N/A 03/17/2016   Procedure: Left Heart Cath and Coronary Angiography;  Surgeon: Bruckner JONETTA Cash, MD;  Location: [facility] INVASIVE CV LAB;  Service: Cardiovascular;  Laterality: N/A;   INCISION AND DRAINAGE PERIRECTAL ABSCESS N/A 04/03/2020   Procedure: EXCISIONAL DEBRIDEMENT  OF SACRAL AND GLUTEAL WOUNDS;  Surgeon: Paola Dreama SAILOR, MD;  [facility];  Service: General;  Laterality: N/A;   IR FLUORO GUIDE CV LINE LEFT  02/22/2020   IR FLUORO GUIDE CV LINE LEFT  02/27/2020   IR FLUORO GUIDE CV LINE LEFT  05/30/2020   IR FLUORO GUIDE CV LINE LEFT  10/10/2020   IR FLUORO GUIDE CV LINE LEFT  10/12/2020   IR FLUORO GUIDE CV LINE LEFT  01/17/2021   IR US  GUIDE VASC ACCESS LEFT  02/22/2020   KNEE SURGERY     LIGATION OF COMPETING BRANCHES OF ARTERIOVENOUS FISTULA Right 11/14/2020   Procedure: LIGATION OF COMPETING BRANCHES AND SUPERFICIALIZATION OF RIGHT ARTERIOVENOUS FISTULA;  Surgeon: Sheree Penne Bruckner, MD;  Location: [facility] OR;  Service: Vascular;  Laterality: Right;   TUBAL LIGATION     UPPER EXTREMITY VENOGRAPHY Bilateral 01/14/2021   Procedure: UPPER EXTREMITY VENOGRAPHY;  Surgeon: Sheree Penne Bruckner, MD;  Location: [facility] INVASIVE CV LAB;  Service: Cardiovascular;  Laterality: Bilateral;    Allergies  Allergen Reactions   Hydrocodone Itching and Nausea And Vomiting    Review of Systems: Negative except as noted in the HPI.  Objective:  Wanda Ramirez is a pleasant 53 y.o. female in NAD. AAO x 3.  Vascular Examination:  Capillary refill time is 3-5 seconds to toes bilateral. Palpable pedal pulses b/l LE. Digital hair present b/l.  Skin temperature gradient WNL b/l. No varicosities b/l. No cyanosis noted b/l.    Dermatological Examination: Pedal skin with normal turgor, texture and tone b/l. No open wounds. No interdigital macerations b/l. Toenails x10 are 3mm thick, discolored, dystrophic with subungual debris. There is pain with compression of the nail plates.  They are elongated x10  Assessment/Plan: 1. Pain due to onychomycosis of toenails of both feet    The mycotic toenails were sharply debrided x10 with sterile nail nippers and a power debriding burr to decrease bulk/thickness and length.    Return in about 3 months (around 04/16/2024) for  [facility].   Awanda CHARM Imperial, DPM, FACFAS [facility]     [address] [place], [place] [address]                Office [phone]  Fax [phone]

## 2024-04-14 ENCOUNTER — Ambulatory Visit: Admitting: Podiatry

## 2024-04-14 DIAGNOSIS — B351 Tinea unguium: Secondary | ICD-10-CM

## 2024-04-14 DIAGNOSIS — M79674 Pain in right toe(s): Secondary | ICD-10-CM | POA: Diagnosis not present

## 2024-04-14 DIAGNOSIS — M79675 Pain in left toe(s): Secondary | ICD-10-CM | POA: Diagnosis not present

## 2024-04-14 NOTE — Progress Notes (Signed)
 Subjective:  Patient ID: Wanda Ramirez, female    DOB: [birth year],  MRN: [id number]   Wanda Ramirez presents to clinic today for:  Chief Complaint  Patient presents with   [facility]    Last A1c: 7.8, takes ASA 81 mg. No corns/callous care needs.    Patient notes nails are thick, discolored, elongated and painful in shoegear when trying to ambulate.   PCP is Trudy Wanda MATSU, FNP.  Past Medical History:  Diagnosis Date   Anemia    Anxiety    Arthritis    Asthma    Chronic back pain    Diabetes mellitus (HCC)    Type II   Dyspnea    with exertion - 4L oxygen via [place]   ESRD (end stage renal disease) on dialysis (HCC)    M W F - [place] [facility] since 2016   Essential hypertension    GERD (gastroesophageal reflux disease)    Glaucoma    History of blood transfusion    Hyperlipidemia    Hypothyroidism    Morbid obesity (HCC)    Myocardial infarction (HCC)    mild per patient   Peripheral neuropathy    left foot   Requires continuous at home supplemental oxygen    4L via [place]   Sleep apnea    wears CPAP, does not know setting    Past Surgical History:  Procedure Laterality Date   AV FISTULA PLACEMENT Left 04/2014   AV FISTULA PLACEMENT Right 08/15/2020   Procedure: RIGHT BRACHIOCEPHALIC ARTERIOVENOUS (AV) FISTULA CREATION;  Surgeon: Sheree Penne Bruckner, MD;  [facility];  Service: Vascular;  Laterality: Right;   AV FISTULA PLACEMENT Right 01/23/2021   Procedure: INSERTION OF RIGHT UPPER ARM ARTERIOVENOUS (AV) GORE-TEX GRAFT, 4-7 mm x 45 cm STRETCH VASCULAR GRAFT;  Surgeon: Serene Gaile ORN, MD;  [facility];  Service: Cardiovascular;  Laterality: Right;   CARDIAC CATHETERIZATION N/A 03/17/2016   Procedure: Left Heart Cath and Coronary Angiography;  Surgeon: Bruckner JONETTA Cash, MD;  Location: [facility] INVASIVE CV LAB;  Service: Cardiovascular;  Laterality: N/A;   INCISION AND DRAINAGE PERIRECTAL ABSCESS N/A 04/03/2020   Procedure: EXCISIONAL  DEBRIDEMENT OF SACRAL AND GLUTEAL WOUNDS;  Surgeon: Paola Dreama SAILOR, MD;  [facility] OR;  Service: General;  Laterality: N/A;   IR FLUORO GUIDE CV LINE LEFT  02/22/2020   IR FLUORO GUIDE CV LINE LEFT  02/27/2020   IR FLUORO GUIDE CV LINE LEFT  05/30/2020   IR FLUORO GUIDE CV LINE LEFT  10/10/2020   IR FLUORO GUIDE CV LINE LEFT  10/12/2020   IR FLUORO GUIDE CV LINE LEFT  01/17/2021   IR US  GUIDE VASC ACCESS LEFT  02/22/2020   KNEE SURGERY     LIGATION OF COMPETING BRANCHES OF ARTERIOVENOUS FISTULA Right 11/14/2020   Procedure: LIGATION OF COMPETING BRANCHES AND SUPERFICIALIZATION OF RIGHT ARTERIOVENOUS FISTULA;  Surgeon: Sheree Penne Bruckner, MD;  Location: [facility] OR;  Service: Vascular;  Laterality: Right;   TUBAL LIGATION     UPPER EXTREMITY VENOGRAPHY Bilateral 01/14/2021   Procedure: UPPER EXTREMITY VENOGRAPHY;  Surgeon: Sheree Penne Bruckner, MD;  Location: [facility] INVASIVE CV LAB;  Service: Cardiovascular;  Laterality: Bilateral;    Allergies  Allergen Reactions   Hydrocodone Itching and Nausea And Vomiting    Review of Systems: Negative except as noted in the HPI.  Objective:  Wanda Ramirez is a pleasant 53 y.o. female in NAD. AAO x 3.  Vascular Examination:  Capillary refill time is 3-5 seconds to toes bilateral. Palpable pedal pulses b/l LE. Digital hair present b/l.  Skin temperature gradient WNL b/l. No varicosities b/l. No cyanosis noted b/l.    Dermatological Examination: Pedal skin with normal turgor, texture and tone b/l. No open wounds. No interdigital macerations b/l. Toenails x10 are 3mm thick, discolored, dystrophic with subungual debris. There is pain with compression of the nail plates.  They are elongated x10  Assessment/Plan: 1. Pain due to onychomycosis of toenails of both feet    The mycotic toenails were sharply debrided x10 with sterile nail nippers and a power debriding burr to decrease bulk/thickness and length.    F/u 3 months   Temia Debroux DSABRA Imperial, DPM,  FACFAS [facility]     [address] [place], [place] [address]                Office [phone]  Fax [phone]

## 2024-07-14 ENCOUNTER — Ambulatory Visit: Admitting: Podiatry
